# Patient Record
Sex: Male | Born: 1955 | Race: White | Hispanic: No | Smoking: Current every day smoker
Health system: Southern US, Community
[De-identification: ages and names within clinical notes are randomized; demographics above are authoritative.]

## PROBLEM LIST (undated history)

## (undated) ENCOUNTER — Emergency Department (HOSPITAL_COMMUNITY): Discharge status: Against Medical Advice | Payer: Self-pay

## (undated) ENCOUNTER — Emergency Department (HOSPITAL_COMMUNITY): Admission: EM | Disposition: A | Discharge status: Home / Self Care | Payer: Self-pay

## (undated) ENCOUNTER — Emergency Department (HOSPITAL_COMMUNITY): Admission: EM | Discharge status: Against Medical Advice | Payer: Medicare PPO

## (undated) DIAGNOSIS — I639 Cerebral infarction, unspecified: Secondary | ICD-10-CM

## (undated) DIAGNOSIS — G629 Polyneuropathy, unspecified: Secondary | ICD-10-CM

## (undated) DIAGNOSIS — G8929 Other chronic pain: Secondary | ICD-10-CM

## (undated) DIAGNOSIS — K219 Gastro-esophageal reflux disease without esophagitis: Secondary | ICD-10-CM

## (undated) DIAGNOSIS — F1010 Alcohol abuse, uncomplicated: Secondary | ICD-10-CM

## (undated) DIAGNOSIS — K7290 Hepatic failure, unspecified without coma: Secondary | ICD-10-CM

## (undated) DIAGNOSIS — K2270 Barrett's esophagus without dysplasia: Secondary | ICD-10-CM

## (undated) DIAGNOSIS — F1410 Cocaine abuse, uncomplicated: Secondary | ICD-10-CM

## (undated) DIAGNOSIS — K7460 Unspecified cirrhosis of liver: Secondary | ICD-10-CM

## (undated) DIAGNOSIS — K7682 Hepatic encephalopathy: Secondary | ICD-10-CM

## (undated) DIAGNOSIS — J449 Chronic obstructive pulmonary disease, unspecified: Secondary | ICD-10-CM

## (undated) DIAGNOSIS — H3320 Serous retinal detachment, unspecified eye: Secondary | ICD-10-CM

## (undated) DIAGNOSIS — I10 Essential (primary) hypertension: Secondary | ICD-10-CM

## (undated) DIAGNOSIS — D649 Anemia, unspecified: Secondary | ICD-10-CM

## (undated) DIAGNOSIS — M1990 Unspecified osteoarthritis, unspecified site: Secondary | ICD-10-CM

## (undated) DIAGNOSIS — M62838 Other muscle spasm: Secondary | ICD-10-CM

## (undated) DIAGNOSIS — F3130 Bipolar disorder, current episode depressed, mild or moderate severity, unspecified: Secondary | ICD-10-CM

## (undated) DIAGNOSIS — J40 Bronchitis, not specified as acute or chronic: Secondary | ICD-10-CM

## (undated) HISTORY — PX: EYE SURGERY: SHX253

## (undated) HISTORY — PX: FRACTURE SURGERY: SHX138

## (undated) HISTORY — PX: VASECTOMY: SHX75

## (undated) SURGERY — EGD (ESOPHAGOGASTRODUODENOSCOPY)
Anesthesia: Moderate Sedation | Laterality: Left

---

## 1997-12-04 ENCOUNTER — Emergency Department (HOSPITAL_COMMUNITY): Admission: EM | Admit: 1997-12-04 | Discharge: 1997-12-04 | Payer: Self-pay | Admitting: Emergency Medicine

## 1998-05-27 ENCOUNTER — Emergency Department (HOSPITAL_COMMUNITY): Admission: EM | Admit: 1998-05-27 | Discharge: 1998-05-27 | Payer: Self-pay | Admitting: Emergency Medicine

## 1998-09-03 ENCOUNTER — Emergency Department (HOSPITAL_COMMUNITY): Admission: EM | Admit: 1998-09-03 | Discharge: 1998-09-03 | Payer: Self-pay | Admitting: Emergency Medicine

## 1998-09-22 ENCOUNTER — Emergency Department (HOSPITAL_COMMUNITY): Admission: EM | Admit: 1998-09-22 | Discharge: 1998-09-22 | Payer: Self-pay | Admitting: Emergency Medicine

## 1998-09-24 ENCOUNTER — Emergency Department (HOSPITAL_COMMUNITY): Admission: EM | Admit: 1998-09-24 | Discharge: 1998-09-24 | Payer: Self-pay | Admitting: Emergency Medicine

## 1999-01-24 ENCOUNTER — Emergency Department (HOSPITAL_COMMUNITY): Admission: EM | Admit: 1999-01-24 | Discharge: 1999-01-24 | Payer: Self-pay | Admitting: Emergency Medicine

## 1999-10-29 ENCOUNTER — Emergency Department (HOSPITAL_COMMUNITY): Admission: EM | Admit: 1999-10-29 | Discharge: 1999-10-29 | Payer: Self-pay | Admitting: Emergency Medicine

## 2000-03-25 ENCOUNTER — Emergency Department (HOSPITAL_COMMUNITY): Admission: EM | Admit: 2000-03-25 | Discharge: 2000-03-25 | Payer: Self-pay | Admitting: Emergency Medicine

## 2001-07-09 ENCOUNTER — Emergency Department (HOSPITAL_COMMUNITY): Admission: EM | Admit: 2001-07-09 | Discharge: 2001-07-09 | Payer: Self-pay | Admitting: Emergency Medicine

## 2001-11-23 ENCOUNTER — Inpatient Hospital Stay (HOSPITAL_COMMUNITY): Admission: EM | Admit: 2001-11-23 | Discharge: 2001-11-26 | Payer: Self-pay | Admitting: Psychiatry

## 2001-12-31 ENCOUNTER — Inpatient Hospital Stay (HOSPITAL_COMMUNITY): Admission: EM | Admit: 2001-12-31 | Discharge: 2002-01-08 | Payer: Self-pay | Admitting: Psychiatry

## 2002-03-25 ENCOUNTER — Inpatient Hospital Stay (HOSPITAL_COMMUNITY): Admission: EM | Admit: 2002-03-25 | Discharge: 2002-03-28 | Payer: Self-pay | Admitting: Psychiatry

## 2002-06-26 ENCOUNTER — Emergency Department (HOSPITAL_COMMUNITY): Admission: EM | Admit: 2002-06-26 | Discharge: 2002-06-26 | Payer: Self-pay | Admitting: Emergency Medicine

## 2002-10-16 ENCOUNTER — Encounter: Admission: RE | Admit: 2002-10-16 | Discharge: 2002-10-16 | Payer: Self-pay | Admitting: Internal Medicine

## 2002-10-16 ENCOUNTER — Encounter: Payer: Self-pay | Admitting: Internal Medicine

## 2003-01-10 ENCOUNTER — Emergency Department (HOSPITAL_COMMUNITY): Admission: EM | Admit: 2003-01-10 | Discharge: 2003-01-10 | Payer: Self-pay | Admitting: Emergency Medicine

## 2003-01-16 ENCOUNTER — Emergency Department (HOSPITAL_COMMUNITY): Admission: EM | Admit: 2003-01-16 | Discharge: 2003-01-16 | Payer: Self-pay | Admitting: *Deleted

## 2003-08-25 ENCOUNTER — Emergency Department (HOSPITAL_COMMUNITY): Admission: EM | Admit: 2003-08-25 | Discharge: 2003-08-25 | Payer: Self-pay | Admitting: Emergency Medicine

## 2003-09-21 ENCOUNTER — Emergency Department (HOSPITAL_COMMUNITY): Admission: EM | Admit: 2003-09-21 | Discharge: 2003-09-21 | Payer: Self-pay | Admitting: Emergency Medicine

## 2004-04-09 ENCOUNTER — Emergency Department (HOSPITAL_COMMUNITY): Admission: EM | Admit: 2004-04-09 | Discharge: 2004-04-09 | Payer: Self-pay | Admitting: Emergency Medicine

## 2004-06-29 ENCOUNTER — Emergency Department (HOSPITAL_COMMUNITY): Admission: EM | Admit: 2004-06-29 | Discharge: 2004-06-29 | Payer: Self-pay | Admitting: Emergency Medicine

## 2004-11-02 ENCOUNTER — Emergency Department (HOSPITAL_COMMUNITY): Admission: EM | Admit: 2004-11-02 | Discharge: 2004-11-02 | Payer: Self-pay | Admitting: Emergency Medicine

## 2005-08-17 ENCOUNTER — Emergency Department (HOSPITAL_COMMUNITY): Admission: EM | Admit: 2005-08-17 | Discharge: 2005-08-18 | Payer: Self-pay | Admitting: Emergency Medicine

## 2006-07-17 ENCOUNTER — Ambulatory Visit (HOSPITAL_COMMUNITY): Admission: RE | Admit: 2006-07-17 | Discharge: 2006-07-17 | Payer: Self-pay | Admitting: Ophthalmology

## 2006-07-24 ENCOUNTER — Ambulatory Visit (HOSPITAL_COMMUNITY): Admission: RE | Admit: 2006-07-24 | Discharge: 2006-07-24 | Payer: Self-pay | Admitting: Ophthalmology

## 2006-08-17 ENCOUNTER — Emergency Department (HOSPITAL_COMMUNITY): Admission: EM | Admit: 2006-08-17 | Discharge: 2006-08-17 | Payer: Self-pay | Admitting: Emergency Medicine

## 2006-08-29 ENCOUNTER — Ambulatory Visit (HOSPITAL_COMMUNITY): Admission: RE | Admit: 2006-08-29 | Discharge: 2006-08-30 | Payer: Self-pay | Admitting: Orthopaedic Surgery

## 2007-02-26 ENCOUNTER — Ambulatory Visit (HOSPITAL_COMMUNITY): Admission: RE | Admit: 2007-02-26 | Discharge: 2007-02-26 | Payer: Self-pay | Admitting: Ophthalmology

## 2007-08-05 ENCOUNTER — Emergency Department (HOSPITAL_COMMUNITY): Admission: EM | Admit: 2007-08-05 | Discharge: 2007-08-05 | Payer: Self-pay | Admitting: Internal Medicine

## 2007-12-24 ENCOUNTER — Encounter: Payer: Self-pay | Admitting: Emergency Medicine

## 2007-12-25 ENCOUNTER — Ambulatory Visit: Payer: Self-pay | Admitting: Psychiatry

## 2007-12-25 ENCOUNTER — Inpatient Hospital Stay (HOSPITAL_COMMUNITY): Admission: EM | Admit: 2007-12-25 | Discharge: 2007-12-28 | Payer: Self-pay | Admitting: Psychiatry

## 2007-12-30 ENCOUNTER — Emergency Department (HOSPITAL_COMMUNITY): Admission: EM | Admit: 2007-12-30 | Discharge: 2007-12-31 | Payer: Self-pay | Admitting: Emergency Medicine

## 2008-02-20 ENCOUNTER — Emergency Department (HOSPITAL_COMMUNITY): Admission: EM | Admit: 2008-02-20 | Discharge: 2008-02-21 | Payer: Self-pay | Admitting: Emergency Medicine

## 2008-05-04 ENCOUNTER — Emergency Department (HOSPITAL_COMMUNITY): Admission: EM | Admit: 2008-05-04 | Discharge: 2008-05-05 | Payer: Self-pay | Admitting: Emergency Medicine

## 2008-08-18 ENCOUNTER — Ambulatory Visit: Payer: Self-pay | Admitting: Radiology

## 2008-08-18 ENCOUNTER — Encounter: Payer: Self-pay | Admitting: Emergency Medicine

## 2008-08-18 ENCOUNTER — Inpatient Hospital Stay (HOSPITAL_COMMUNITY): Admission: EM | Admit: 2008-08-18 | Discharge: 2008-08-21 | Payer: Self-pay | Admitting: Internal Medicine

## 2008-08-18 ENCOUNTER — Encounter (INDEPENDENT_AMBULATORY_CARE_PROVIDER_SITE_OTHER): Payer: Self-pay | Admitting: Internal Medicine

## 2008-08-19 ENCOUNTER — Ambulatory Visit: Payer: Self-pay | Admitting: Surgery

## 2008-08-19 ENCOUNTER — Encounter (INDEPENDENT_AMBULATORY_CARE_PROVIDER_SITE_OTHER): Payer: Self-pay | Admitting: Internal Medicine

## 2008-09-05 ENCOUNTER — Emergency Department (HOSPITAL_COMMUNITY): Admission: EM | Admit: 2008-09-05 | Discharge: 2008-09-05 | Payer: Self-pay | Admitting: Emergency Medicine

## 2008-10-18 ENCOUNTER — Encounter (INDEPENDENT_AMBULATORY_CARE_PROVIDER_SITE_OTHER): Payer: Self-pay | Admitting: Emergency Medicine

## 2008-10-18 ENCOUNTER — Emergency Department (HOSPITAL_COMMUNITY): Admission: EM | Admit: 2008-10-18 | Discharge: 2008-10-18 | Payer: Self-pay | Admitting: Emergency Medicine

## 2008-10-18 ENCOUNTER — Ambulatory Visit: Payer: Self-pay | Admitting: *Deleted

## 2008-10-29 ENCOUNTER — Emergency Department (HOSPITAL_COMMUNITY): Admission: EM | Admit: 2008-10-29 | Discharge: 2008-10-30 | Payer: Self-pay

## 2008-11-01 ENCOUNTER — Emergency Department (HOSPITAL_COMMUNITY): Admission: EM | Admit: 2008-11-01 | Discharge: 2008-11-01 | Payer: Self-pay | Admitting: Emergency Medicine

## 2008-11-02 ENCOUNTER — Encounter (INDEPENDENT_AMBULATORY_CARE_PROVIDER_SITE_OTHER): Payer: Self-pay | Admitting: Emergency Medicine

## 2008-11-02 ENCOUNTER — Ambulatory Visit (HOSPITAL_COMMUNITY): Admission: RE | Admit: 2008-11-02 | Discharge: 2008-11-02 | Payer: Self-pay | Admitting: Emergency Medicine

## 2008-11-02 ENCOUNTER — Ambulatory Visit: Payer: Self-pay | Admitting: *Deleted

## 2008-12-13 ENCOUNTER — Emergency Department (HOSPITAL_COMMUNITY): Admission: EM | Admit: 2008-12-13 | Discharge: 2008-12-13 | Payer: Self-pay | Admitting: Emergency Medicine

## 2009-01-20 ENCOUNTER — Ambulatory Visit: Payer: Self-pay | Admitting: Psychiatry

## 2009-01-20 ENCOUNTER — Emergency Department (HOSPITAL_COMMUNITY): Admission: EM | Admit: 2009-01-20 | Discharge: 2009-01-20 | Payer: Self-pay | Admitting: Emergency Medicine

## 2009-01-20 ENCOUNTER — Inpatient Hospital Stay (HOSPITAL_COMMUNITY): Admission: RE | Admit: 2009-01-20 | Discharge: 2009-01-22 | Payer: Self-pay | Admitting: Psychiatry

## 2009-02-12 ENCOUNTER — Emergency Department (HOSPITAL_COMMUNITY): Admission: EM | Admit: 2009-02-12 | Discharge: 2009-02-12 | Payer: Self-pay | Admitting: Emergency Medicine

## 2009-03-04 ENCOUNTER — Emergency Department (HOSPITAL_COMMUNITY): Admission: EM | Admit: 2009-03-04 | Discharge: 2009-03-04 | Payer: Self-pay | Admitting: Emergency Medicine

## 2009-03-04 ENCOUNTER — Inpatient Hospital Stay (HOSPITAL_COMMUNITY): Admission: RE | Admit: 2009-03-04 | Discharge: 2009-03-06 | Payer: Self-pay | Admitting: Psychiatry

## 2009-03-05 ENCOUNTER — Ambulatory Visit: Payer: Self-pay | Admitting: Psychiatry

## 2009-04-13 ENCOUNTER — Emergency Department (HOSPITAL_COMMUNITY): Admission: EM | Admit: 2009-04-13 | Discharge: 2009-04-14 | Payer: Self-pay | Admitting: Emergency Medicine

## 2009-04-14 ENCOUNTER — Ambulatory Visit: Payer: Self-pay | Admitting: Psychiatry

## 2009-04-14 ENCOUNTER — Inpatient Hospital Stay (HOSPITAL_COMMUNITY): Admission: AD | Admit: 2009-04-14 | Discharge: 2009-04-15 | Payer: Self-pay | Admitting: Psychiatry

## 2009-05-05 ENCOUNTER — Inpatient Hospital Stay (HOSPITAL_COMMUNITY): Admission: AD | Admit: 2009-05-05 | Discharge: 2009-05-09 | Payer: Self-pay | Admitting: Psychiatry

## 2009-05-05 ENCOUNTER — Emergency Department (HOSPITAL_COMMUNITY): Admission: EM | Admit: 2009-05-05 | Discharge: 2009-05-05 | Payer: Self-pay | Admitting: Emergency Medicine

## 2009-05-31 ENCOUNTER — Ambulatory Visit: Payer: Self-pay | Admitting: Psychiatry

## 2009-05-31 ENCOUNTER — Emergency Department (HOSPITAL_COMMUNITY): Admission: EM | Admit: 2009-05-31 | Discharge: 2009-06-01 | Payer: Self-pay | Admitting: Emergency Medicine

## 2009-06-01 ENCOUNTER — Inpatient Hospital Stay (HOSPITAL_COMMUNITY): Admission: RE | Admit: 2009-06-01 | Discharge: 2009-06-05 | Payer: Self-pay | Admitting: Psychiatry

## 2010-06-13 ENCOUNTER — Encounter: Payer: Self-pay | Admitting: Internal Medicine

## 2010-08-08 LAB — GLUCOSE, CAPILLARY
Glucose-Capillary: 207 mg/dL — ABNORMAL HIGH (ref 70–99)
Glucose-Capillary: 247 mg/dL — ABNORMAL HIGH (ref 70–99)
Glucose-Capillary: 257 mg/dL — ABNORMAL HIGH (ref 70–99)
Glucose-Capillary: 273 mg/dL — ABNORMAL HIGH (ref 70–99)
Glucose-Capillary: 287 mg/dL — ABNORMAL HIGH (ref 70–99)
Glucose-Capillary: 348 mg/dL — ABNORMAL HIGH (ref 70–99)
Glucose-Capillary: 356 mg/dL — ABNORMAL HIGH (ref 70–99)
Glucose-Capillary: 369 mg/dL — ABNORMAL HIGH (ref 70–99)

## 2010-08-08 LAB — URINALYSIS, ROUTINE W REFLEX MICROSCOPIC
Bilirubin Urine: NEGATIVE
Glucose, UA: >1000 mg/dL — AB
Hgb urine dipstick: NEGATIVE
Ketones, ur: NEGATIVE mg/dL
Leukocytes, UA: NEGATIVE
Nitrite: NEGATIVE
Protein, ur: NEGATIVE mg/dL
Specific Gravity, Urine: 1.039 — ABNORMAL HIGH (ref 1.005–1.030)
Urobilinogen, UA: 0.2 mg/dL (ref 0.0–1.0)
pH: 5 (ref 5.0–8.0)

## 2010-08-08 LAB — POCT I-STAT, CHEM 8
BUN: 10 mg/dL (ref 6–23)
Calcium, Ion: 1.16 mmol/L (ref 1.12–1.32)
Chloride: 101 mEq/L (ref 96–112)
Creatinine, Ser: 0.7 mg/dL (ref 0.4–1.5)
Glucose, Bld: 356 mg/dL — ABNORMAL HIGH (ref 70–99)
HCT: 48 % (ref 39.0–52.0)
Hemoglobin: 16.3 g/dL (ref 13.0–17.0)
Potassium: 4.7 mEq/L (ref 3.5–5.1)
Sodium: 129 mEq/L — ABNORMAL LOW (ref 135–145)
TCO2: 22 mmol/L (ref 0–100)

## 2010-08-08 LAB — RPR: RPR Ser Ql: NONREACTIVE

## 2010-08-08 LAB — URINE MICROSCOPIC-ADD ON: Urine-Other: NONE SEEN

## 2010-08-08 LAB — CBC
HCT: 45.2 % (ref 39.0–52.0)
Hemoglobin: 15.8 g/dL (ref 13.0–17.0)
MCHC: 35 g/dL (ref 30.0–36.0)
MCV: 93.5 fL (ref 78.0–100.0)
Platelets: 78 10*3/uL — ABNORMAL LOW (ref 150–400)
RBC: 4.84 MIL/uL (ref 4.22–5.81)
RDW: 13.5 % (ref 11.5–15.5)
WBC: 4.8 10*3/uL (ref 4.0–10.5)

## 2010-08-08 LAB — DIFFERENTIAL
Basophils Absolute: 0 10*3/uL (ref 0.0–0.1)
Basophils Relative: 0 % (ref 0–1)
Eosinophils Absolute: 0.1 10*3/uL (ref 0.0–0.7)
Eosinophils Relative: 2 % (ref 0–5)
Lymphocytes Relative: 23 % (ref 12–46)
Lymphs Abs: 1.1 10*3/uL (ref 0.7–4.0)
Monocytes Absolute: 0.4 10*3/uL (ref 0.1–1.0)
Monocytes Relative: 8 % (ref 3–12)
Neutro Abs: 3.2 10*3/uL (ref 1.7–7.7)
Neutrophils Relative %: 67 % (ref 43–77)

## 2010-08-08 LAB — RAPID URINE DRUG SCREEN, HOSP PERFORMED
Amphetamines: NOT DETECTED
Barbiturates: NOT DETECTED
Benzodiazepines: POSITIVE — AB
Cocaine: POSITIVE — AB
Opiates: POSITIVE — AB
Tetrahydrocannabinol: NOT DETECTED

## 2010-08-08 LAB — ETHANOL: Alcohol, Ethyl (B): <5 mg/dL (ref 0–10)

## 2010-08-08 LAB — PLATELET COUNT: Platelets: 69 10*3/uL — ABNORMAL LOW (ref 150–400)

## 2010-08-23 LAB — GLUCOSE, CAPILLARY
Glucose-Capillary: 253 mg/dL — ABNORMAL HIGH (ref 70–99)
Glucose-Capillary: 293 mg/dL — ABNORMAL HIGH (ref 70–99)
Glucose-Capillary: 301 mg/dL — ABNORMAL HIGH (ref 70–99)
Glucose-Capillary: 326 mg/dL — ABNORMAL HIGH (ref 70–99)
Glucose-Capillary: 372 mg/dL — ABNORMAL HIGH (ref 70–99)

## 2010-08-24 LAB — GLUCOSE, CAPILLARY
Comment 1: 8888
Glucose-Capillary: 219 mg/dL — ABNORMAL HIGH (ref 70–99)
Glucose-Capillary: 236 mg/dL — ABNORMAL HIGH (ref 70–99)
Glucose-Capillary: 285 mg/dL — ABNORMAL HIGH (ref 70–99)
Glucose-Capillary: 294 mg/dL — ABNORMAL HIGH (ref 70–99)
Glucose-Capillary: 300 mg/dL — ABNORMAL HIGH (ref 70–99)
Glucose-Capillary: 356 mg/dL — ABNORMAL HIGH (ref 70–99)
Glucose-Capillary: 379 mg/dL — ABNORMAL HIGH (ref 70–99)

## 2010-08-24 LAB — CBC
MCHC: 34.2 g/dL (ref 30.0–36.0)
MCV: 95.6 fL (ref 78.0–100.0)
Platelets: 93 10*3/uL — ABNORMAL LOW (ref 150–400)
RDW: 14.1 % (ref 11.5–15.5)

## 2010-08-24 LAB — COMPREHENSIVE METABOLIC PANEL
ALT: 54 U/L — ABNORMAL HIGH (ref 0–53)
AST: 49 U/L — ABNORMAL HIGH (ref 0–37)
Calcium: 9.2 mg/dL (ref 8.4–10.5)
Creatinine, Ser: 0.67 mg/dL (ref 0.4–1.5)
GFR calc Af Amer: >60 mL/min (ref >60)
Sodium: 128 mEq/L — ABNORMAL LOW (ref 135–145)
Total Protein: 7.4 g/dL (ref 6.0–8.3)

## 2010-08-24 LAB — URINALYSIS, ROUTINE W REFLEX MICROSCOPIC
Glucose, UA: >1000 mg/dL — AB
Leukocytes, UA: NEGATIVE
Nitrite: NEGATIVE
Protein, ur: NEGATIVE mg/dL
Urobilinogen, UA: 0.2 mg/dL (ref 0.0–1.0)

## 2010-08-24 LAB — RAPID URINE DRUG SCREEN, HOSP PERFORMED
Benzodiazepines: POSITIVE — AB
Tetrahydrocannabinol: NOT DETECTED

## 2010-08-24 LAB — DIFFERENTIAL
Eosinophils Absolute: 0.1 10*3/uL (ref 0.0–0.7)
Eosinophils Relative: 2 % (ref 0–5)
Lymphocytes Relative: 27 % (ref 12–46)
Lymphs Abs: 2 10*3/uL (ref 0.7–4.0)
Monocytes Relative: 6 % (ref 3–12)

## 2010-08-24 LAB — URINE MICROSCOPIC-ADD ON: Urine-Other: NONE SEEN

## 2010-08-25 LAB — GLUCOSE, CAPILLARY
Glucose-Capillary: 291 mg/dL — ABNORMAL HIGH (ref 70–99)
Glucose-Capillary: 418 mg/dL — ABNORMAL HIGH (ref 70–99)
Glucose-Capillary: 482 mg/dL — ABNORMAL HIGH (ref 70–99)

## 2010-08-25 LAB — RAPID URINE DRUG SCREEN, HOSP PERFORMED
Amphetamines: NOT DETECTED
Barbiturates: NOT DETECTED
Benzodiazepines: POSITIVE — AB
Cocaine: NOT DETECTED
Opiates: NOT DETECTED
Tetrahydrocannabinol: NOT DETECTED

## 2010-08-25 LAB — URINALYSIS, ROUTINE W REFLEX MICROSCOPIC
Bilirubin Urine: NEGATIVE
Glucose, UA: >1000 mg/dL — AB
Hgb urine dipstick: NEGATIVE
Ketones, ur: NEGATIVE mg/dL
Leukocytes, UA: NEGATIVE
Nitrite: NEGATIVE
Protein, ur: NEGATIVE mg/dL
Specific Gravity, Urine: 1.026 (ref 1.005–1.030)
Urobilinogen, UA: 0.2 mg/dL (ref 0.0–1.0)
pH: 5 (ref 5.0–8.0)

## 2010-08-25 LAB — COMPREHENSIVE METABOLIC PANEL WITH GFR
Albumin: 3.8 g/dL (ref 3.5–5.2)
BUN: 14 mg/dL (ref 6–23)
Chloride: 97 meq/L (ref 96–112)
Creatinine, Ser: 0.8 mg/dL (ref 0.4–1.5)
Total Bilirubin: 0.9 mg/dL (ref 0.3–1.2)
Total Protein: 7.5 g/dL (ref 6.0–8.3)

## 2010-08-25 LAB — DIFFERENTIAL
Basophils Absolute: 0 K/uL (ref 0.0–0.1)
Basophils Relative: 0 % (ref 0–1)
Eosinophils Absolute: 0.1 10*3/uL (ref 0.0–0.7)
Eosinophils Relative: 2 % (ref 0–5)
Lymphocytes Relative: 23 % (ref 12–46)
Lymphs Abs: 1.4 10*3/uL (ref 0.7–4.0)
Monocytes Absolute: 0.5 K/uL (ref 0.1–1.0)
Monocytes Relative: 8 % (ref 3–12)
Neutro Abs: 4 K/uL (ref 1.7–7.7)
Neutrophils Relative %: 67 % (ref 43–77)

## 2010-08-25 LAB — CBC
HCT: 45.1 % (ref 39.0–52.0)
Hemoglobin: 15.9 g/dL (ref 13.0–17.0)
MCHC: 35.3 g/dL (ref 30.0–36.0)
MCV: 93.4 fL (ref 78.0–100.0)
Platelets: 73 K/uL — ABNORMAL LOW (ref 150–400)
RBC: 4.83 MIL/uL (ref 4.22–5.81)
RDW: 13.9 % (ref 11.5–15.5)
WBC: 6.1 10*3/uL (ref 4.0–10.5)

## 2010-08-25 LAB — CK TOTAL AND CKMB (NOT AT ARMC)
CK, MB: 2.5 ng/mL (ref 0.3–4.0)
Relative Index: INVALID (ref 0.0–2.5)
Total CK: 76 U/L (ref 7–232)

## 2010-08-25 LAB — COMPREHENSIVE METABOLIC PANEL
ALT: 45 U/L (ref 0–53)
AST: 37 U/L (ref 0–37)
Alkaline Phosphatase: 86 U/L (ref 39–117)
CO2: 16 mEq/L — ABNORMAL LOW (ref 19–32)
Calcium: 9.9 mg/dL (ref 8.4–10.5)
GFR calc Af Amer: >60 mL/min (ref >60)
GFR calc non Af Amer: >60 mL/min (ref >60)
Glucose, Bld: 617 mg/dL (ref 70–99)
Potassium: 3.8 mEq/L (ref 3.5–5.1)
Sodium: 131 mEq/L — ABNORMAL LOW (ref 135–145)

## 2010-08-25 LAB — URINE MICROSCOPIC-ADD ON

## 2010-08-25 LAB — BASIC METABOLIC PANEL
CO2: 21 mEq/L (ref 19–32)
Calcium: 8.1 mg/dL — ABNORMAL LOW (ref 8.4–10.5)
Chloride: 103 mEq/L (ref 96–112)
Creatinine, Ser: 0.55 mg/dL (ref 0.4–1.5)
Glucose, Bld: 238 mg/dL — ABNORMAL HIGH (ref 70–99)
Sodium: 133 mEq/L — ABNORMAL LOW (ref 135–145)

## 2010-08-25 LAB — ETHANOL: Alcohol, Ethyl (B): 159 mg/dL — ABNORMAL HIGH (ref 0–10)

## 2010-08-25 LAB — TROPONIN I: Troponin I: 0.01 ng/mL (ref 0.00–0.06)

## 2010-08-25 LAB — KETONES, QUALITATIVE: Acetone, Bld: NEGATIVE

## 2010-08-26 LAB — BASIC METABOLIC PANEL
BUN: 13 mg/dL (ref 6–23)
Calcium: 8.9 mg/dL (ref 8.4–10.5)
Chloride: 98 mEq/L (ref 96–112)
Creatinine, Ser: 0.72 mg/dL (ref 0.4–1.5)
GFR calc Af Amer: >60 mL/min (ref >60)

## 2010-08-26 LAB — GLUCOSE, CAPILLARY
Glucose-Capillary: 300 mg/dL — ABNORMAL HIGH (ref 70–99)
Glucose-Capillary: 357 mg/dL — ABNORMAL HIGH (ref 70–99)
Glucose-Capillary: 376 mg/dL — ABNORMAL HIGH (ref 70–99)
Glucose-Capillary: 416 mg/dL — ABNORMAL HIGH (ref 70–99)
Glucose-Capillary: 471 mg/dL — ABNORMAL HIGH (ref 70–99)

## 2010-08-26 LAB — CBC
MCV: 94 fL (ref 78.0–100.0)
Platelets: 93 10*3/uL — ABNORMAL LOW (ref 150–400)
RBC: 4.82 MIL/uL (ref 4.22–5.81)
WBC: 7.5 10*3/uL (ref 4.0–10.5)

## 2010-08-26 LAB — RAPID URINE DRUG SCREEN, HOSP PERFORMED
Amphetamines: NOT DETECTED
Benzodiazepines: POSITIVE — AB
Cocaine: POSITIVE — AB
Tetrahydrocannabinol: NOT DETECTED

## 2010-08-26 LAB — DIFFERENTIAL
Basophils Relative: 1 % (ref 0–1)
Eosinophils Absolute: 0.2 10*3/uL (ref 0.0–0.7)
Lymphs Abs: 2.1 10*3/uL (ref 0.7–4.0)
Neutro Abs: 4.4 10*3/uL (ref 1.7–7.7)
Neutrophils Relative %: 59 % (ref 43–77)

## 2010-08-26 LAB — ETHANOL: Alcohol, Ethyl (B): 107 mg/dL — ABNORMAL HIGH (ref 0–10)

## 2010-08-27 LAB — GLUCOSE, CAPILLARY
Glucose-Capillary: 145 mg/dL — ABNORMAL HIGH (ref 70–99)
Glucose-Capillary: 222 mg/dL — ABNORMAL HIGH (ref 70–99)
Glucose-Capillary: 231 mg/dL — ABNORMAL HIGH (ref 70–99)
Glucose-Capillary: 263 mg/dL — ABNORMAL HIGH (ref 70–99)
Glucose-Capillary: 269 mg/dL — ABNORMAL HIGH (ref 70–99)

## 2010-08-27 LAB — HEPATIC FUNCTION PANEL
Albumin: 3.4 g/dL — ABNORMAL LOW (ref 3.5–5.2)
Indirect Bilirubin: 1 mg/dL — ABNORMAL HIGH (ref 0.3–0.9)
Total Protein: 6.6 g/dL (ref 6.0–8.3)

## 2010-08-28 LAB — CBC
Platelets: 74 10*3/uL — ABNORMAL LOW (ref 150–400)
RBC: 5.07 MIL/uL (ref 4.22–5.81)
WBC: 5.6 10*3/uL (ref 4.0–10.5)

## 2010-08-28 LAB — RAPID URINE DRUG SCREEN, HOSP PERFORMED
Amphetamines: NOT DETECTED
Barbiturates: NOT DETECTED
Benzodiazepines: POSITIVE — AB

## 2010-08-28 LAB — POCT I-STAT, CHEM 8
Calcium, Ion: 1.12 mmol/L (ref 1.12–1.32)
HCT: 52 % (ref 39.0–52.0)
Hemoglobin: 17.7 g/dL — ABNORMAL HIGH (ref 13.0–17.0)
TCO2: 20 mmol/L (ref 0–100)

## 2010-08-28 LAB — ETHANOL: Alcohol, Ethyl (B): 29 mg/dL — ABNORMAL HIGH (ref 0–10)

## 2010-08-28 LAB — DIFFERENTIAL
Basophils Absolute: 0 10*3/uL (ref 0.0–0.1)
Basophils Relative: 0 % (ref 0–1)
Eosinophils Absolute: 0.1 10*3/uL (ref 0.0–0.7)
Eosinophils Relative: 2 % (ref 0–5)
Lymphocytes Relative: 21 % (ref 12–46)
Monocytes Absolute: 0.4 10*3/uL (ref 0.1–1.0)
Neutrophils Relative %: 69 % (ref 43–77)

## 2010-08-28 LAB — GLUCOSE, CAPILLARY: Glucose-Capillary: 197 mg/dL — ABNORMAL HIGH (ref 70–99)

## 2010-08-28 LAB — URINALYSIS, ROUTINE W REFLEX MICROSCOPIC
Glucose, UA: >1000 mg/dL — AB
Ketones, ur: NEGATIVE mg/dL
Leukocytes, UA: NEGATIVE
Nitrite: NEGATIVE
Protein, ur: NEGATIVE mg/dL
pH: 6 (ref 5.0–8.0)

## 2010-08-28 LAB — URINE MICROSCOPIC-ADD ON

## 2010-08-30 LAB — URINALYSIS, ROUTINE W REFLEX MICROSCOPIC
Bilirubin Urine: NEGATIVE
Glucose, UA: >1000 mg/dL — AB
Ketones, ur: NEGATIVE mg/dL
pH: 5.5 (ref 5.0–8.0)

## 2010-08-30 LAB — CBC
Hemoglobin: 13.9 g/dL (ref 13.0–17.0)
Hemoglobin: 13.9 g/dL (ref 13.0–17.0)
MCHC: 34.5 g/dL (ref 30.0–36.0)
MCV: 94.4 fL (ref 78.0–100.0)
RBC: 4.24 MIL/uL (ref 4.22–5.81)
RBC: 4.27 MIL/uL (ref 4.22–5.81)
WBC: 3.7 10*3/uL — ABNORMAL LOW (ref 4.0–10.5)

## 2010-08-30 LAB — POCT I-STAT, CHEM 8
BUN: 9 mg/dL (ref 6–23)
Calcium, Ion: 1.15 mmol/L (ref 1.12–1.32)
Chloride: 102 mEq/L (ref 96–112)
Glucose, Bld: 172 mg/dL — ABNORMAL HIGH (ref 70–99)

## 2010-08-30 LAB — URINE MICROSCOPIC-ADD ON

## 2010-08-30 LAB — DIFFERENTIAL
Basophils Absolute: 0 10*3/uL (ref 0.0–0.1)
Eosinophils Absolute: 0.1 10*3/uL (ref 0.0–0.7)
Lymphocytes Relative: 34 % (ref 12–46)
Lymphs Abs: 1.1 10*3/uL (ref 0.7–4.0)
Monocytes Absolute: 0.3 10*3/uL (ref 0.1–1.0)
Monocytes Relative: 8 % (ref 3–12)
Neutro Abs: 1.9 10*3/uL (ref 1.7–7.7)
Neutrophils Relative %: 56 % (ref 43–77)

## 2010-08-30 LAB — POCT CARDIAC MARKERS: Troponin i, poc: <0.05 ng/mL (ref 0.00–0.09)

## 2010-08-30 LAB — COMPREHENSIVE METABOLIC PANEL
ALT: 34 U/L (ref 0–53)
CO2: 23 mEq/L (ref 19–32)
Calcium: 9.4 mg/dL (ref 8.4–10.5)
Chloride: 108 mEq/L (ref 96–112)
Creatinine, Ser: 0.76 mg/dL (ref 0.4–1.5)
GFR calc non Af Amer: >60 mL/min (ref >60)
Glucose, Bld: 298 mg/dL — ABNORMAL HIGH (ref 70–99)
Sodium: 138 mEq/L (ref 135–145)
Total Bilirubin: 0.6 mg/dL (ref 0.3–1.2)

## 2010-08-30 LAB — RAPID URINE DRUG SCREEN, HOSP PERFORMED
Amphetamines: NOT DETECTED
Barbiturates: NOT DETECTED
Opiates: NOT DETECTED

## 2010-08-31 LAB — COMPREHENSIVE METABOLIC PANEL
ALT: 31 U/L (ref 0–53)
AST: 39 U/L — ABNORMAL HIGH (ref 0–37)
Albumin: 3.5 g/dL (ref 3.5–5.2)
CO2: 24 mEq/L (ref 19–32)
Calcium: 9.1 mg/dL (ref 8.4–10.5)
GFR calc Af Amer: >60 mL/min (ref >60)
GFR calc non Af Amer: >60 mL/min (ref >60)
Sodium: 131 mEq/L — ABNORMAL LOW (ref 135–145)

## 2010-08-31 LAB — GLUCOSE, CAPILLARY
Glucose-Capillary: 212 mg/dL — ABNORMAL HIGH (ref 70–99)
Glucose-Capillary: 381 mg/dL — ABNORMAL HIGH (ref 70–99)

## 2010-08-31 LAB — URINALYSIS, ROUTINE W REFLEX MICROSCOPIC
Bilirubin Urine: NEGATIVE
Glucose, UA: >1000 mg/dL — AB
Hgb urine dipstick: NEGATIVE
Ketones, ur: NEGATIVE mg/dL
Leukocytes, UA: NEGATIVE
Nitrite: NEGATIVE
Protein, ur: NEGATIVE mg/dL
Specific Gravity, Urine: 1.031 — ABNORMAL HIGH (ref 1.005–1.030)
Urobilinogen, UA: 0.2 mg/dL (ref 0.0–1.0)
pH: 5.5 (ref 5.0–8.0)

## 2010-08-31 LAB — CBC
MCHC: 33.8 g/dL (ref 30.0–36.0)
RBC: 4.72 MIL/uL (ref 4.22–5.81)
WBC: 5.1 10*3/uL (ref 4.0–10.5)

## 2010-08-31 LAB — RAPID URINE DRUG SCREEN, HOSP PERFORMED
Amphetamines: NOT DETECTED
Barbiturates: NOT DETECTED
Benzodiazepines: POSITIVE — AB
Cocaine: POSITIVE — AB
Opiates: NOT DETECTED
Tetrahydrocannabinol: NOT DETECTED

## 2010-08-31 LAB — DIFFERENTIAL
Basophils Absolute: 0 10*3/uL (ref 0.0–0.1)
Basophils Relative: 0 % (ref 0–1)
Eosinophils Absolute: 0.2 10*3/uL (ref 0.0–0.7)
Eosinophils Relative: 4 % (ref 0–5)
Lymphocytes Relative: 22 % (ref 12–46)
Lymphs Abs: 1.1 10*3/uL (ref 0.7–4.0)
Monocytes Absolute: 0.4 10*3/uL (ref 0.1–1.0)
Monocytes Relative: 8 % (ref 3–12)
Neutro Abs: 3.3 10*3/uL (ref 1.7–7.7)
Neutrophils Relative %: 66 % (ref 43–77)

## 2010-08-31 LAB — BRAIN NATRIURETIC PEPTIDE: Pro B Natriuretic peptide (BNP): <30 pg/mL (ref 0.0–100.0)

## 2010-09-01 LAB — GLUCOSE, CAPILLARY
Glucose-Capillary: 114 mg/dL — ABNORMAL HIGH (ref 70–99)
Glucose-Capillary: 180 mg/dL — ABNORMAL HIGH (ref 70–99)

## 2010-09-01 LAB — LIPID PANEL
Cholesterol: 144 mg/dL (ref 0–200)
HDL: 39 mg/dL — ABNORMAL LOW (ref >39)
Total CHOL/HDL Ratio: 3.7 RATIO

## 2010-09-01 LAB — CBC
HCT: 39.3 % (ref 39.0–52.0)
MCHC: 35.1 g/dL (ref 30.0–36.0)
MCV: 94.9 fL (ref 78.0–100.0)
RBC: 4.14 MIL/uL — ABNORMAL LOW (ref 4.22–5.81)
WBC: 6.2 10*3/uL (ref 4.0–10.5)

## 2010-09-01 LAB — POCT I-STAT, CHEM 8
BUN: 5 mg/dL — ABNORMAL LOW (ref 6–23)
Calcium, Ion: 1.08 mmol/L — ABNORMAL LOW (ref 1.12–1.32)
Chloride: 108 mEq/L (ref 96–112)
Glucose, Bld: 123 mg/dL — ABNORMAL HIGH (ref 70–99)
Potassium: 3.9 mEq/L (ref 3.5–5.1)

## 2010-09-01 LAB — BASIC METABOLIC PANEL
BUN: 9 mg/dL (ref 6–23)
CO2: 26 mEq/L (ref 19–32)
Chloride: 96 mEq/L (ref 96–112)
Creatinine, Ser: 0.79 mg/dL (ref 0.4–1.5)
GFR calc Af Amer: >60 mL/min (ref >60)
Potassium: 4.1 mEq/L (ref 3.5–5.1)

## 2010-09-02 LAB — CARDIAC PANEL(CRET KIN+CKTOT+MB+TROPI)
CK, MB: 2.8 ng/mL (ref 0.3–4.0)
CK, MB: 4.3 ng/mL — ABNORMAL HIGH (ref 0.3–4.0)
Relative Index: 0.5 (ref 0.0–2.5)
Total CK: 682 U/L — ABNORMAL HIGH (ref 7–232)
Troponin I: <0.01 ng/mL (ref 0.00–0.06)
Troponin I: <0.01 ng/mL (ref 0.00–0.06)

## 2010-09-02 LAB — GLUCOSE, CAPILLARY
Glucose-Capillary: 159 mg/dL — ABNORMAL HIGH (ref 70–99)
Glucose-Capillary: 176 mg/dL — ABNORMAL HIGH (ref 70–99)
Glucose-Capillary: 189 mg/dL — ABNORMAL HIGH (ref 70–99)
Glucose-Capillary: 192 mg/dL — ABNORMAL HIGH (ref 70–99)
Glucose-Capillary: 195 mg/dL — ABNORMAL HIGH (ref 70–99)
Glucose-Capillary: 239 mg/dL — ABNORMAL HIGH (ref 70–99)
Glucose-Capillary: 262 mg/dL — ABNORMAL HIGH (ref 70–99)
Glucose-Capillary: 322 mg/dL — ABNORMAL HIGH (ref 70–99)
Glucose-Capillary: 188 mg/dL — ABNORMAL HIGH (ref 70–99)

## 2010-09-02 LAB — CULTURE, BLOOD (ROUTINE X 2)

## 2010-09-02 LAB — DIFFERENTIAL
Basophils Absolute: 0 10*3/uL (ref 0.0–0.1)
Basophils Relative: 0 % (ref 0–1)
Eosinophils Relative: 2 % (ref 0–5)
Lymphs Abs: 1 10*3/uL (ref 0.7–4.0)
Monocytes Absolute: 1 10*3/uL (ref 0.1–1.0)
Monocytes Relative: 7 % (ref 3–12)
Neutro Abs: 8.2 10*3/uL — ABNORMAL HIGH (ref 1.7–7.7)
Neutro Abs: 8.5 10*3/uL — ABNORMAL HIGH (ref 1.7–7.7)
Neutrophils Relative %: 81 % — ABNORMAL HIGH (ref 43–77)

## 2010-09-02 LAB — URINALYSIS, ROUTINE W REFLEX MICROSCOPIC
Bilirubin Urine: NEGATIVE
Ketones, ur: 15 mg/dL — AB
Nitrite: NEGATIVE
Specific Gravity, Urine: 1.027 (ref 1.005–1.030)
Urobilinogen, UA: 0.2 mg/dL (ref 0.0–1.0)

## 2010-09-02 LAB — BASIC METABOLIC PANEL
CO2: 27 mEq/L (ref 19–32)
CO2: 27 mEq/L (ref 19–32)
Chloride: 98 mEq/L (ref 96–112)
Chloride: 99 mEq/L (ref 96–112)
Creatinine, Ser: 0.97 mg/dL (ref 0.4–1.5)
GFR calc Af Amer: >60 mL/min (ref >60)
GFR calc Af Amer: >60 mL/min (ref >60)
Potassium: 4 mEq/L (ref 3.5–5.1)
Sodium: 130 mEq/L — ABNORMAL LOW (ref 135–145)

## 2010-09-02 LAB — SALICYLATE LEVEL: Salicylate Lvl: 1 mg/dL — ABNORMAL LOW (ref 2.8–20.0)

## 2010-09-02 LAB — CBC
HCT: 43.3 % (ref 39.0–52.0)
Hemoglobin: 14.9 g/dL (ref 13.0–17.0)
MCHC: 34.5 g/dL (ref 30.0–36.0)
MCV: 94.9 fL (ref 78.0–100.0)
Platelets: 70 10*3/uL — ABNORMAL LOW (ref 150–400)
RBC: 4.56 MIL/uL (ref 4.22–5.81)
RDW: 13.3 % (ref 11.5–15.5)
RDW: 12.3 % (ref 11.5–15.5)

## 2010-09-02 LAB — COMPREHENSIVE METABOLIC PANEL
BUN: 17 mg/dL (ref 6–23)
CO2: 28 mEq/L (ref 19–32)
Calcium: 8.5 mg/dL (ref 8.4–10.5)
Creatinine, Ser: 0.8 mg/dL (ref 0.4–1.5)
GFR calc non Af Amer: >60 mL/min (ref >60)
Glucose, Bld: 282 mg/dL — ABNORMAL HIGH (ref 70–99)
Total Protein: 7.2 g/dL (ref 6.0–8.3)

## 2010-09-02 LAB — POCT TOXICOLOGY PANEL: Benzodiazepines: POSITIVE

## 2010-09-02 LAB — POCT I-STAT 3, ART BLOOD GAS (G3+)
pCO2 arterial: 45 mmHg (ref 35.0–45.0)
pH, Arterial: 7.362 (ref 7.350–7.450)
pO2, Arterial: 61 mmHg — ABNORMAL LOW (ref 80.0–100.0)

## 2010-09-02 LAB — HEMOGLOBIN A1C
Hgb A1c MFr Bld: 8.7 % — ABNORMAL HIGH (ref 4.6–6.1)
Mean Plasma Glucose: 203 mg/dL

## 2010-09-02 LAB — TSH: TSH: 0.992 u[IU]/mL (ref 0.350–4.500)

## 2010-09-02 LAB — ACETAMINOPHEN LEVEL: Acetaminophen (Tylenol), Serum: <10 ug/mL — ABNORMAL LOW (ref 10–30)

## 2010-09-02 LAB — URINE MICROSCOPIC-ADD ON

## 2010-10-05 NOTE — Op Note (Signed)
NAMEELDEN, BRUCATO                 ACCOUNT NO.:  [id number]   MEDICAL RECORD NO.:  [id number]          PATIENT TYPE:  AMB   LOCATION:  SDS                          FACILITY:  [facility]   PHYSICIAN:  Alford Highland. Rankin, M.D.   DATE OF BIRTH:  11/07/1955   DATE OF PROCEDURE:  02/26/2007  DATE OF DISCHARGE:                               OPERATIVE REPORT   PREOPERATIVE DIAGNOSES:  1. 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  2. Proliferative vitreoretinopathy, right eye -- stage D,   POSTOPERATIVE DIAGNOSES:  1. 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  2. Proliferative vitreoretinopathy, right eye -- stage D .   PROCEDURE:  1. Posterior vitrectomy with complex repair of retinal detachment,      right eye -- 25 gauge -- using membrane peel techniques, air-fluid      exchange, endolaser photocoagulation and injection of vitreous      substitute -- C3F8 8% -- right eye.   SURGEON:  Alford Highland. Rankin, M.D.   ANESTHESIA:  Local with retrobulbar with monitored anesthesia control.   INDICATIONS FOR /PROCEDURE:  The patient is a 55 year old man with  profound vision loss in the right eye with a history of retinal  detachment successfully repaired in March 2008, OS.  The patient  understands this is an attempt to reattach the retina.  He understands  the risks of anesthesia including the rare occurrence of death, but also  to the eye from the condition as well the surgical repair including, but  not limited to, hemorrhage, infection, scarring, need for surgery, no  change in vision, loss of vision and progressive disease despite  intervention.  After appropriate signed consent was obtained, the  patient was taken to the operating room.  In the operating room,  appropriate monitors were followed by mild sedation.  Marcaine 75%, was  delivered, 5 mL, retrobulbar followed by an additional 5 mL in a fashion  of  modified Darel Hong.  The right periocular region was sterilely prepped  and draped in the usual ophthalmic fashion.  A lid speculum was applied.  A 12-gauge trocar was placed in the inferotemporal quadrant.  Superior  trocars were applied.  Core vitrectomy was then begun.  Vitreous skirt  was trimmed 360 degrees and scleral depression was then used.  Fluid-air  exchange was carried out through the inferior retinal break at the 6  o'clock position.  A retinal break was noted at the 11 o'clock position.  The posterior hyaloid was removed with the vitreous skirt trimmed 360 mL  using scleral depression.  At this time, a small retinotomy inferior to  the optic nerve was created so as to drain the posterior subretinal  fluid.  Fluid-air exchange was completed.  Reaspiration of the re-  collected fluid was done with a soft-tipped silicone needle.   At this time, endolaser photocoagulation was placed 360 degrees as well  as around the retinal breaks previously described, as well as subsequent  around the retinotomy site.   At this time an air-C3F8 8% exchange was completed.  The elevating  trocars were removed.   The infusion was removed.  Subconjunctival Decadron was applied.  Sterile patch and Fox shield were applied.  The patient was taken to the  short-stay area in good and stable condition, having tolerated the  procedure without complication.      Alford Highland Rankin, M.D.  Electronically Signed     GAR/MEDQ  D:  02/26/2007  T:  02/27/2007  Job:  [id number]
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
## 2010-10-05 NOTE — Discharge Summary (Signed)
[name]                 ACCOUNT NO.:  [id number]   MEDICAL RECORD NO.:  [id number]          PATIENT TYPE:  INP   LOCATION:  5021                         FACILITY:  [facility]   PHYSICIAN:  Peggye Pitt, M.D. DATE OF BIRTH:  [birth year]   DATE OF ADMISSION:  08/18/2008  DATE OF DISCHARGE:  08/21/2008                               DISCHARGE SUMMARY   DISCHARGE DIAGNOSES:  1. Transient ischemic attack.  2. Right lower lobe pneumonia.  3. Type 2 diabetes mellitus.  4. Hypertension.  5. History of alcohol abuse.   DISCHARGE MEDICATIONS:  1. Avelox 400 mg daily for 10 days.  2. Aspirin 81 mg daily.  3. Lantus insulin 35 units subcu at bedtime.  4. Multivitamin 1 tablet daily.  5. Thiamine 100 mg daily.  6. Neurontin 300 mg 3 times a day.  7. Actos 30 mg daily.  8. Lisinopril 20 mg daily.  9. Glipizide 10 mg daily.  10.Amitriptyline 50 mg at bedtime.  11.Vistaril 75 mg at bedtime p.r.n.  12.Trazodone 50 mg at bedtime p.r.n. for insomnia.  13.Robaxin 1500 mL p.o. every 6 hours as needed for pain prescribed by      [name].  14.Ibuprofen 800 mg p.o. every 8 hours as needed for pain.   DISPOSITION AND FOLLOWUP:  [name] is discharged home in stable  condition.  He is advised to keep a CBG log and to bring this into his  primary care physician at his next visit.  I have encouraged that he  call his physician, [name], and setup an appointment for hospital  followup in about 2 weeks.  In about 4-6 weeks, a repeat chest x-ray  should be done to ensure resolution of his pneumonia.   CONSULTATION THIS HOSPITALIZATION:  None.   IMAGES AND PROCEDURES PERFORMED DURING THIS HOSPITALIZATION:  1. A CT scan of the head without contrast on August 18, 2008, that      showed no acute significant findings.  2. A chest x-ray on August 18, 2008, that showed suboptimal inspiration      with bronchitic changes, cannot exclude early right lower lobe      pneumonia.  3. Repeat chest  x-ray on August 19, 2008, that shows progression of      right lower lobe infiltrate with progression of vascular congestion      and mild edema.  4. Repeat CT scan without contrast on August 21, 2008, that was negative      for bleed or other acute intracranial process.  The patient also      had carotid Dopplers that showed 60% bilateral ICA stenosis.  5. A 2-D echocardiogram on August 19, 2008, that shows normal LV size      and systolic function.  Mild LVH.  Diastolic relaxation      abnormality.   HISTORY AND PHYSICAL EXAMINATION:  For full details, please refer to  dictation on August 18, 2008, by Dr. Toniann Fail.  But, in brief, Mr.  Baker is a pleasant 55 year old obese Caucasian man with history of  hypertension, diabetes, and chronic alcoholism who  has been at a detox  center for the last 7 days.  He complained of right-sided weakness at  the detox center for which I sent him to the ER for further evaluation.  Upon arrival, his focal neurological deficits had completely resolved.  CT scan of the head did not show any acute findings.  A chest x-ray  showed a possible pneumonia and because of this, he was admitted to the  hospital for further evaluation and management.   HOSPITAL COURSE:  1. Transient ischemic attack.  Initial CT scan showed no evidence for      an acute bleed.  Because of weight restrictions, we could not do an      MRI.  However, a repeat CT at over 48 hours did not show any      evidence of a CVA.  We have started him on aspirin.  Physical      Therapy has cleared for discharge home.  Main thing at this point      will be aggressive risk factor modification to control his blood      pressure and his diabetes.  He also had a fasting lipid panel for      risk stratification in the hospital that showed a total cholesterol      144, triglycerides of 74, HDL of 39, and an LDL of 90.  No statin      has been prescribed upon discharge.  2. For his right lower lobe  pneumonia, he was initially on Zosyn and      azithromycin for concern of aspiration pneumonia.  He has been      afebrile and has not had leukocytosis.  On day of discharge, I have      transitioned him over to p.o. Avelox for which he will have to take      a 10-day course.  I would recommend that he follow up with Dr.      [name] and a repeat chest x-ray will be done in 4-6 weeks to ensure      resolution.  3. Hypertension.  Excellent blood pressure control.  4. For his type 2 diabetes, his CBG is are much improved on increased      Lantus from 20-25.  This may need to be further adjusted in the      outpatient setting.  5. For his alcohol abuse, he has been kept on thiamine while in the      hospital.   VITAL SIGNS ON DAY OF DISCHARGE:  Blood pressure 120/75, heart rate 94,  respirations 20, O2 sats 95% on room air with a temperature of 99.0.   LABORATORY DATA ON DAY OF DISCHARGE:  Sodium 134, potassium 4.1,  chloride 96, bicarb 26, BUN 9, creatinine 0.79 with a glucose of 163,  WBC 6.2, hemoglobin 13.8, and a platelet count of 80,000.      Peggye Pitt, M.D.  Electronically Signed     EH/MEDQ  D:  08/21/2008  T:  08/22/2008  Job:  [id number]   cc:   Lonia Blood, M.D.

## 2010-10-05 NOTE — H&P (Signed)
NAMEALLIE, GERHOLD NO.:  [id number]   MEDICAL RECORD NO.:  [id number]          PATIENT TYPE:  INP   LOCATION:  2607                         FACILITY:  [facility]   PHYSICIAN:  Eduard Clos, MDDATE OF BIRTH:  1955-10-15   DATE OF ADMISSION:  08/18/2008  DATE OF DISCHARGE:                              HISTORY & PHYSICAL   PRIMARY CARE PHYSICIAN:  Lonia Blood, M.D.   CHIEF COMPLAINT:  Right-sided weakness.   HISTORY OF PRESENTING ILLNESS:  A 55 year old male with known history of  hypertension, diabetes mellitus type 2, chronic alcoholism on detox now  last 7 days, had complaints of right-sided weakness when he woke up in  the morning and was taken to the ER.  In the ER, Dr. Carleene Cooper, who  saw patient, found that he had no focal deficits.  CT of the head was  done which also did not show any acute findings.  Patient was very  drowsy.  A chest x-ray was done which showed possibility of pneumonia.  Patient has been admitted for further observation and evaluation of his  pneumonia and possible TIA.  Patient at this time is asymptomatic,  drowsy, easily arousable and answers questions appropriately.  Patient  denies any chest pain, weakness of upper or lower extremities now,  denies any shortness of breath, nausea, vomiting, abdominal pain,  dysuria, discharges or diarrhea.  Patient did say that he had some cough  with a productive sputum for last 2 or 3 days and he has been at detox  for last 7 days and been off alcohol.  Patient says that his weakness  was transient in the morning.  When he woke up, he was unable to move  his right upper and right lower extremity and, in addition, also had  some pain in the right shoulder.  He states the pain was not the  limiting factor but he did have some weakness which he thought he may  have had a stroke.  There was no weakness in his left upper or left  lower extremity and there is no headache or facial asymmetry.   Had some  slurred speech which has gotten better.  Patient denies any difficult  swallowing or visual symptoms.   PAST MEDICAL HISTORY:  1. Hypertension.  2. Diabetes mellitus type 2.  3. Chronic alcoholism.  4. Severe smoking.   PAST SURGICAL HISTORY:  None.   MEDICATION PRIOR TO ADMISSION:  1. Multivitamin 1 tablet p.o. daily.  2. Thiamine 100 mg p.o. daily.  3. Neurontin 300 mg p.o. t.i.d.  4. Actos 30 mg p.o. daily.  5. Lisinopril 20 mg p.o. daily.  6. Glipizide 10 mg p.o. daily.  7. Amitriptyline 50 mg p.o. at bedtime.  8. Lantus 50 units subcutaneous at bedtime.  9. Vistaril 75 mg p.o. q.6 p.r.n. for anxiety.  10.Trazodone 50 mg p.o. at bedtime p.r.n. for insomnia.  11.Robaxin 2500 mg p.o. q.6 p.r.n. for pain.  12.Ibuprofen 800 mg p.o. q.8 hourly p.r.n. for pain.   FAMILY HISTORY:  Nothing contributory.   SOCIAL HISTORY:  Patient smokes  cigarettes, has been strongly advised to  quit smoking.  Has not had alcohol for the last 7 days because he was in  rehab, is trying to detox and denies drug abuse though was on chronic  pain pills for chronic myalgias.   ALLERGIES:  No known drug allergies.   REVIEW OF SYSTEMS:  As per the history of presenting illness; nothing  else significant.   PHYSICAL EXAMINATION:  Patient examined at bedside, not in acute  distress.  VITAL SIGNS:  Blood pressure 120/60, pulse 90 per minute, temperature  98, respirations 18 per minute, O2 saturation 98% on 2 L.  HEENT:  Anicteric, no pallor.  No facial asymmetry.  Tongue is midline.  PERRLA positive.  No neck rigidity.  CHEST:  Bilateral air entry present.  No rhonchi.  No crepitation.  HEART:  S1 and S2 heard.  ABDOMEN:  Soft, nontender.  Bowel sounds heard.  CNS:  Patient mildly drowsy, easily arousable, follows commands,  oriented to time, place and person.  Moves upper and lower extremities  5/5.  EXTREMITIES:  Peripheral pulses felt.  Has chronic skin changes in the  lower  extremities.  No edema.   LABS:  CT of the head shows no acute significant findings.  Chest x-ray  shows a suboptimal level of inspiration.  There are bronchitic changes,  cannot exclude  early right lower lobe pneumonia.  ABG:  A pH is 7.36,  pCO2 is 45, pO2 is 61, oxygen saturation 90% on room air.  CBC:  WBC  10.4, hemoglobin 14.9, hematocrit 43.3, platelets 82, neutrophils 81%.  Complete metabolic panel:  Sodium 130, potassium 5.1, chloride 95, CO2  of 28, glucose 282, anion gap 7, BUN 17, creatinine 0.8.  AST 68, ALT  38, albumin 4, calcium 8.5.  Acetaminophen level less than 10,  salicylate level 1.  Drug screen is only positive for benzo.  Patient  used to take Xanax which he also took before he went to detox.  Alcohol  level less than 10.  Urine is positive for glucose more 1000.  Ketones  15 with anion gap is only 7.  WBC 0.   ASSESSMENT:  1. Possible transient ischemic attack.  Symptoms are completely      resolved now.  No focal deficit.  2. Possible aspiration pneumonia.  3. Hypertension.  4. Diabetes mellitus type 2.  5. Bipolar disorder.  6. Hyponatremia.   PLAN:  We will admit patient to telemetry, place on neuro checks, get  MRI/MRA, 2-D echo, carotid Dopplers and we will place patient on empiric  antibiotics and patient's symptoms improve, we will try to taper off  antibiotics and change to p.o. antibiotics.  We will get a repeat chest  x-ray in the a.m. and we will continue his home medication including  Lantus insulin and sliding scale insulin and further recommendation as  condition evolves.      Eduard Clos, MD  Electronically Signed     ANK/MEDQ  D:  08/18/2008  T:  08/18/2008  Job:  [id number]
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
## 2010-10-08 NOTE — H&P (Signed)
[facility]  Patient:    [name], [name] Visit Number: [id number] MRN: [id number]          Service Type: PSY Location: 500 [id number] 02 Attending Physician:  Rachael Fee Dictated by:   Young Berry Scott, N.P. Admit Date:  11/23/2001 Discharge Date: 11/26/2001                     Psychiatric Admission Assessment  DATE OF ASSESSMENT:  November 23, 2001 at 11:20 a.m.  IDENTIFYING INFORMATION:  This is a 55 year old Caucasian male, who is a voluntary admission.  He is divorced.  HISTORY OF PRESENT ILLNESS:  This patient, with a history of "bipolar illness", presented in the emergency room yesterday, requesting to be detoxed from alcohol.  He stated that he was sick and tired of drinking and wanted to be off the alcohol.  He reports drinking for many years socially with increased use, usually heavy daily use for the past five years, since he divorced his wife.  Recently, he has been drinking 7-8 beers daily for several months with occasional use of cocaine.  He denies suicidal ideation or homicidal ideation or auditory or visual hallucinations.  He denies any seizures in the past or delirium tremors associated with alcohol withdrawal. ETOH level was 48 in the emergency room.  PAST PSYCHIATRIC HISTORY:  The patient has a history of bipolar disease that he states was diagnosed approximately five years ago after he was hospitalized in [place].  At that point, he was prescribed Depakote but he states he never took it.  He also does have a past history of taking Celexa and Lexapro in the past for depression but has taken things irregularly, only when he had the insurance to pay for the medication.  He has a history of prior admissions at [facility] in [place] for detox from alcohol and other substances and was treated at [facility] in 2000.  This is his first admission to [facility].  SOCIAL HISTORY:  The patient reports he is originally  from [place].  Has lived here in [place] for the past eight years, on and off.  He is currently divorced from his wife for the past five years and he has three grown children.  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  He is currently unemployed and has Medicaid to pay for prescription medications.  He has applied for social security disability income on the basis of his diabetes and his mental illness.  FAMILY HISTORY:  Father and four brothers with history of alcohol abuse.  ALCOHOL/DRUG HISTORY:  The patients urine drug screen was positive for cocaine.  In addition to his alcohol use, he reports occasional use of cocaine.  MEDICAL HISTORY:  The patient has no regular primary care Merdith Adan.  The patient reports that he has had elevated blood sugar for the past two years and has been taking his fathers Glucophage, taking approximately 2000 mg twice daily but taking this irregularly and has not had any medical follow-up for this.  He was diagnosed with tinea versicolor and does have some rash over his upper torso and his left arm but has not taken any antifungal creams for this in several weeks.  MEDICATIONS:  Currently none.  DRUG ALLERGIES:  None.  POSITIVE PHYSICAL FINDINGS:  The patients physical examination was done at [facility] Emergency Room where it was essentially unremarkable.  LABORATORY DATA:  Glucose 277.  Sodium mildly decreased at 134 but otherwise electrolytes within normal limits.  The patients BUN 10, creatinine 0.8.  CBC reveals WBC 7.6, RBC 5.37, hemoglobin 16.9, hematocrit 47.8.  The patients platelets are decreased at 113,000, although he denies any prior history of blood problems.  MCV is 88.9 and RDW 12.9.  The patients urine drug screen is positive for cocaine.  Urine shows greater than 1000 mg of glucose.  Urine is yellow and clear.  Protein shows 30 mg and ketones are trace.  WBCs are  0-2. Thyroid panel is currently pending.  MENTAL STATUS EXAMINATION:  This is a medium-built, generally healthy-appearing, disheveled Caucasian male who is fully alert, in no acute distress with an appropriate affect and generally normal motor behavior. Sensory also appears grossly intact.  His affect is appropriate.  He is pleasant, cooperative and polite.  Speech is normal, relevant and relatively articulate.  Mood is euthymic.  Thought process is logical and coherent, generally goal directed.  The patient is candid about his substance abuse and his alcohol use and its effect on his life over the last few years.  His insight is good.  There is no evidence of dangerous ideation.  No paranoia. He is able to track conversation well and think logically.  Cognitively, he is intact and oriented x 3.  Intelligence is average to above average.  Insight good.  Impulse control and judgment within normal limits.  DIAGNOSES: Axis I:    1. Alcohol abuse; rule out dependence.            2. Cocaine abuse; rule out dependence.            3. Bipolar disorder, by history. Axis II:   Deferred. Axis III:  1. Diabetes mellitus, type 2.            2. Tinea versicolor by history.            3. Thrombocytopenia. Axis IV:   Moderate (use of alcohol to cope with his issues and some economic            problems related to erratic employment and problems with having            family members who also use alcohol and other substances.  Having a            supportive brother is definitely an asset). Axis V:    Current 38; past year 59.  PLAN:  Voluntarily admit the patient to detox him from alcohol with a goal of a safe detox in five days and we will evaluate his previous mood disorder when he makes some progress.  Consider starting him on Lexapro after he makes some progress with detox, if depression seems to be an issue.  Meanwhile, we have elected to start him on phenobarbital detox program with no loading  dose and that will start today.  For his elevated glucose, we will start him on Glucophage 500 mg p.o. b.i.d. and titrate that dosage as needed and we will  keep him on capillary blood glucoses b.i.d. a.c. supper and breakfast.  For his tinea versicolor, which he is having an exacerbation of, we will put him on Nizoral 2% cream to be applied daily after showering for the next two weeks and we will ask him to follow up and get a primary care physician for his additional medical problems.  ESTIMATED LENGTH OF STAY:  Five days. Dictated by:   Young Berry Scott, N.P.  Attending Physician:  Rachael Fee DD:  11/23/01 TD:  11/23/01 Job: [id number]

## 2010-10-08 NOTE — Discharge Summary (Signed)
NAME:  [name], [name] NO.:  [id number]   MEDICAL RECORD NO.:  [id number]          PATIENT TYPE:  IPS   LOCATION:  0402                          [facility]ACILITY:  [facility]   PHYSICIAN:  Anselm Jungling, MD  DATE OF BIRTH:  1955-06-23   DATE OF ADMISSION:  12/25/2007  DATE OF DISCHARGE:  12/28/2007                               DISCHARGE SUMMARY   IDENTIFYING DATA/REASON FOR ADMISSION:  The patient is a 54 year old  separated white male who indicated that he had started drinking alcohol  heavily to self-medicate, giving the rationalization that he had run out  of his medications.  He went to the emergency department asking for  detoxification and stated that he would commit suicide if he did not get  these services.  Please refer to the admission note for further details  pertaining to the symptoms, circumstances and history that led to his  hospitalization.  He was given an initial Axis I diagnosis of alcohol  dependence.   MEDICAL/LABORATORY:  The patient came to [place] with a history of  hypertension and diabetes mellitus.  He was treated with an insulin  regimen, lisinopril 10 mg daily.  He was also given Robaxin 500 mg twice  daily for muscle relaxation.  He requested a prescription for Chantix at  the time of discharge.   HOSPITAL COURSE:  The patient was admitted to the adult inpatient  psychiatric service.  He presented as a well-nourished, normally-  developed male who was initially evaluated by Dr. Milford Cage.  On the  third hospital day, the undersigned assumed the patient's care.  He had  been on a Librium detoxification protocol.  He had a family meeting and  reported that it had gone well.  His discharge plan was to return home  with his wife, who is involved in Al-Anon.  He planned to return to  Alcoholics Anonymous and get a sponsor.  He was pleasant, relaxed and  nontremulous.  He was also planning to see Dr. Tillie Fantasia, psychiatrist  again for medication  management.  He requested discharge on the fourth  hospital day.  He was not finished with his Librium protocol, but he did  not feel he needed it.  He agreed to the following aftercare plan.   AFTERCARE:  1. The patient was to follow up at the [facility] with an      appointment on January 08, 2008.  2. He was instructed to follow up with Dr. Britt Boozer for medical issues.   DISCHARGE MEDICATIONS:  1. Insulin regimen as usual.  2. Lisinopril 10 mg daily.  3. Robaxin 500 mg b.i.d.  4. Trazodone 100 mg q.h.s. as needed for sleep.  5. Chantix per instructions on box.   DISCHARGE DIAGNOSES:  AXIS I:  Alcohol dependence.  AXIS II:  Deferred.  AXIS III:  History of hypertension, diabetes mellitus.  AXIS IV:  Stressors, severe.  AXIS V:  Global assessment of functioning on discharge 60.      Anselm Jungling, MD  Electronically Signed     SPB/MEDQ  D:  01/09/2008  T:  01/09/2008  Job:  [id number]
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
## 2010-10-08 NOTE — Discharge Summary (Signed)
NAME:  [name], CORRADI NO.:  [id number]   MEDICAL RECORD NO.:  [id number]                   PATIENT TYPE:  IPS   LOCATION:  0506                                 [facility]ILITY[facility]   PHYSICIAN:  Jeanice Lim, M.D.              DATE OF BIRTH:  1955-06-17   DATE OF ADMISSION:  03/25/2002  DATE OF DISCHARGE:  03/28/2002                                 DISCHARGE SUMMARY   IDENTIFYING DATA:  This is a 55 year old white male, divorced, voluntarily  admitted, reported my mood has been crazy and I had to get back to doing the  right things and taking the right medications.  He relapsed on alcohol,  drinking a total of a 12-pack of beer daily and relapsed on cocaine.  He had  recently been discharged from [facility] and was doing well  until he stopped taking his Depakote several weeks prior to admission and  stopped taking lithium and began using Klonopin for his nerves.   PAST PSYCHIATRIC HISTORY:  Followed by Rozanna Box, last seen two weeks  ago.  Third admission at [facility].  Long history of  substance abuse including cocaine and alcohol.  History of attending  Alcoholics Anonymous.   MEDICATIONS:  1. Lithium, previously stabilized on 600 mg b.i.d.  2. Depakote 350 mg bid and 500 q.h.s.  3. Gabitril 4 mg t.i.d.  4. Trazodone 100 mg q.h.s.  5. Glucophage XR 1000 mg b.i.d.  6. Hydrochlorothiazide 12.5 mg q.a.m.  7. Lotensin 10 mg q.a.m.  8. Amaryl 4 mg q.a.m. and 2 mg at 5 p.m.  9. Novolin insulin in the past on a sliding scale basis.  10.      K-Dur 20 mEq daily.   ALLERGIES:  No known drug allergies.   PHYSICAL EXAMINATION:  Essentially within normal limits.  Neurologically  nonfocal.   LABORATORY DATA:  Routine admission labs with a mildly elevated white count,  otherwise, within normal limits except for glucose elevated at 184.   MENTAL STATUS EXAM:  Overweight, middle-aged male, sleepy, responsive  grossly.   Psychomotor within normal limits.  Cooperative.  Blunted affect.  Speech within normal limits.  Mood mildly depressed.  Feeling somewhat  guilty about relapse and alcohol.  Thought process goal directed.  Thought  content vague suicidal ideation.  Depressed mood.  Cognitively intact.  Judgment and insight limited and poor impulse control.   ADMISSION DIAGNOSES:   AXIS I:  1. Bipolar disorder, NOS.  2. Polysubstance abuse.  3. 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   AXIS II:  None.   AXIS III:  1. Diabetes mellitus type 2.  2. Hypertension.  3. Thrombocytopenia.   AXIS IV:  Moderate stressors related to domestic stress, stress from  girlfriend's mother and chronic medication noncompliance.   AXIS V:  30/65.  Patient was admitted on routine p.r.n. medications, underwent further  monitoring, was  encouraged to participate in individual, group and _____  therapy.  Was resumed on a sliding scale insulin and Librium p.r.n.  withdrawal, Lotensin, hydrochlorothiazide, Glucophage, Amaryl, Depakote,  Gabitril, K-Dur, trazodone, Vistaril p.r.n. anxiety.  General medicine  consult was obtained regarding management of diabetes and hypertension.  Patient was placed in the dual diagnosis tract for therapy and patient was  placed on Librium detox protocol and Symmetrel for cocaine cravings.  Amaryl  was adjusted for control of glucose the patient tolerated detox without  complications and clinical intervention with a positive response.   CONDITION ON DISCHARGE:  Markedly improved.  Mood more euthymic.  Affect  varied.  Thought process goal directed.  Thought content negative for  dangerous ideation or psychotic symptoms.  Patient had no acute medical  complaints, no acute withdrawal symptoms.  Reported motivation to be  compliant with the aftercare plan including to remain sober.  Patient was  discharged on K-Dur 20 mEq q.a.m., Ambien 10 mg q.h.s., Lotensin 10 mg  q.a.m., hydrochlorothiazide 12.5 mg  q.a.m., Glucophage XR 500 mg two b.i.d.,  Amaryl 2 mg one at 5 p.m., Depakote 250 mg b.i.d. and 2 q.h.s., Gabitril 4  mg t.i.d.  Patient was to avoid alcohol.  Was aware that it was life  threatening to drink on the medications that he is currently on.  He is  scheduled to follow up with the primary care physician within one week of  discharge and to [facility] on 11/11 at 2:15 p.m.   DISCHARGE DIAGNOSES:   AXIS I:  1. Bipolar disorder, NOS.  2. Polysubstance abuse.  3. 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   AXIS II:  None.   AXIS III:  1. Diabetes mellitus type 2.  2. Hypertension.  3. Thrombocytopenia.   AXIS IV:  Moderate stressors related to domestic stress, stress from  girlfriend's mother, and chronic medication noncompliance.    AXIS V:  GAF on discharge was 55.                                                Jeanice Lim, M.D.    JEM/MEDQ  D:  04/16/2002  T:  04/16/2002  Job:  [id number]

## 2010-10-08 NOTE — Discharge Summary (Signed)
NAME:  [name], [name]                           ACCOUNT NO.:  [id number]   MEDICAL RECORD NO.:  [id number]                   PATIENT TYPE:  IPS   LOCATION:  0500                                 FACILITY:  [facility]   PHYSICIAN:  Carolanne Grumbling, MD                   DATE OF BIRTH:  [birth year]   DATE OF ADMISSION:  12/31/2001  DATE OF DISCHARGE:  01/08/2002                                 DISCHARGE SUMMARY   IDENTIFYING INFORMATION:  The patient is a 55 year old male.   INITIAL ASSESSMENT AND DIAGNOSIS:  The patient was admitted to the hospital  for detox.  He had started drinking about a week prior to admission, saying  that he had run out of his Klonopin.  Consequently, he started drinking and  then was using cocaine.  At the time of admission, his alcohol blood level  was 204.  He reportedly had a history of bipolar disorder.  He was in this  hospital 4 months ago for similar problems prior to this admission.  He  admitted to various depressive symptoms.  He also admitted to noncompliance  with his diabetic medications.  At the time of admission he had no specific  suicidal plans.   MENTAL STATUS AT THE TIME OF THE INITIAL EVALUATION:  Revealed an alert,  oriented male.  He was somewhat drowsy but was cooperative.  Speech was  easily understood.  Mood was generally euthymic.  There was no evidence of  any suicidal ideation or threats towards others.  There was no evidence of  any thought disorder or other psychosis.  He considered his main problem as  alcohol abuse and wanting to get off the alcohol once again.  Other  pertinent history can be obtained from the psychosocial service summary.   PHYSICAL EXAMINATION:  Essentially normal.   ADMITTING DIAGNOSIS:   AXIS I:  1. Rule out benzodiazapine abuse versus dependence.  2. Bipolar disorder.  3. Alcohol abuse.  4. Cocaine abuse.   AXIS II:  Deferred.   AXIS III:  1. Diabetes mellitus type 2.  2. Osteoarthritis.  3.  Hypertension.  4. Thrombocytopenia not otherwise specified.  5. Migraine headaches.  6. Leukocytosis not otherwise specified.   AXIS IV:  Moderate.   AXIS V:  30/66.   LABORATORY AND ACCESSORY DATA:  All indicated laboratory examinations were  within normal limits or consistent with his stated medical problems.   HOSPITAL COURSE:  While in the hospital, [name] course was uneventful.  He basically was put on an alcohol withdrawal protocol, which he handled  without any particular difficulty.  He talked about his depression,  particularly towards his separation from his wife, his anger and his  resentment about that, but he seemed to be more accepting about what he had  to do regarding his substance abuse as well as his unhappiness towards his  wife by  the time of discharge.  He consistently denied any suicidal thoughts  while he was in the hospital.  He did have consults regarding the treatment  of his diabetes specifically, and he seemed to be feeling much better  physically as well as emotionally by the time of discharge.  Consequently he  was discharged home.   POST HOSPITAL CARE PLANS:  He has an appointment to see Dr. Elna Breslow on  August 22.   MEDICATIONS AT THE TIME OF DISCHARGE:  1. Lotensin 10 mg daily.  2. Hydrochlorothiazide 12.5 mg daily.  3. Glucophage XR 500 mg tablets.  He takes 2 tablets 2 times daily.  4. Lithium carbonate 300 mg tablets.  He takes 2 2 times a day  5. Amaryl 4 mg daily and 2 mg at 5 p.m.  6. Depakote ER 250 mg twice daily and 500 mg at bedtime.  7. Novolin and other diabetic medications as prescribed by his outpatient     doctor.  8. Gabitril 4 mg 3 times daily.  9. K-Dur 20 mEq daily.  10.      Trazodone 100 mg tablets, taking 3 at bedtime.   DIET AND ACTIVITY:  He was instructed to follow his diabetic diet as  prescribed by his outpatient doctor.  There were no other restrictions  placed on his diet or his activity.                                                 Carolanne Grumbling, MD    GT/MEDQ  D:  01/14/2002  T:  01/14/2002  Job:  [id number]

## 2010-10-08 NOTE — H&P (Signed)
NAME:  [name], [name]                           ACCOUNT NO.:  [id number]   MEDICAL RECORD NO.:  [id number]                   PATIENT TYPE:  IPS   LOCATION:  0500                                 FACILITY:  [facility]   PHYSICIAN:  Reymundo Poll. Dub Mikes, M.D.                DATE OF BIRTH:  1956-05-12   DATE OF ADMISSION:  12/31/2001  DATE OF DISCHARGE:                         PSYCHIATRIC ADMISSION ASSESSMENT   CHIEF COMPLAINT:  I ran out of Klonopin, then got depressed, then started  on the alcohol and the cocaine.   PATIENT IDENTIFICATION:  This 55 year old Caucasian male was a voluntary  admission.   HISTORY OF PRESENT ILLNESS:  This patient with a history of bipolar disorder  relapsed on alcohol and cocaine after running out of Klonopin prescription.  He reports that he had not been keeping regular appointments with his  psychiatrist in order to get his medications renewed and is not clear if he  was really taking his Klonopin according to the way it was prescribed.  He  complained of then feeling down and depression without his Klonopin and  stated that he had feelings that he has not wanted to get out of bed.  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  He admits noncompliance with all of his  medications including his diabetes medications.  He denies any specific  suicidal ideation or homicidal ideation, denies any auditory and visual  hallucinations.  He relapsed on alcohol last week, drinking since Friday and  his alcohol level was 204 in the emergency room.  He also relapsed on  cocaine, using cocaine once last week.   PAST PSYCHIATRIC HISTORY:  The patient has been followed by Dr. Elna Breslow  at [facility].  This is second [facility] admission with his last one being approximately  four months ago.   The patient has a history of being diagnosed with bipolar  disorder about four years ago in [place], [place], after a long  history of mood fluctuations, finding himself going for days without needing  any sleep, wanting to work around the clock, sometimes two and three days in  a row; also a history of getting overly involved in a number business  ventures.  Then also experiencing periods of depression where he would find  himself unable to cope with his business responsibilities and being unable  to get out of bed.  The patient denies any history of prior suicide  attempts.  He has a long history of medication noncompliance and a history  of alcohol and cocaine abuse.   SUBSTANCE ABUSE HISTORY:  Already noted above.  The patient's urine drug  screen was positive for cocaine, negative for other substances.  The patient  ETOH level in the emergency room was 204.  He reports that he had been clean  from alcohol and substances up until last week.   PAST MEDICAL HISTORY:  The patient has been followed by [facility] but he  is there only erratically, is not getting regular followup.  Medical  problems: Diabetes mellitus type 2, migraine headaches, osteoarthritis of  his knees and back, and hypertension.  On his previous admission here  approximately four months ago, the patient was seen by [facility]  and diagnosed with diabetes mellitus type 2 and placed on a treatment  regimen.  The patient has a history of thrombocytopenia, possibly due to  substance abuse.   MEDICATIONS:  1. Klonopin 2 mg t.i.d.  2. Lithium 300 mg t.i.d.  3. Amaryl 4 mg p.o. q.d.  4. Metformin XR 1000 mg p.o. b.i.d.  5. Lotensin 12.5 mg daily for hypertension.   It should be noted that the patient has been noncompliant with about all of  his medications, not taking them regularly.   DRUG ALLERGIES:  None.   PHYSICAL EXAMINATION:  The patient's physical examination was done at the  [facility]  Emergency Department where it was essentially negative.   VITAL SIGNS:  On admission to the unit, temperature 97.2, pulse 95,  respirations 20, blood pressure 154/97.  He weighed 234 pounds and he 5 feet  11 inches tall.   LABORATORY DATA:  Alcohol level was found to be 204.  Metabolic panel was  remarkable a glucose elevated at 257.  Creatinine 1.0, BUN 8.  Electrolytes  were within normal limits.  Liver enzymes were not done.  CBC was remarkable  for an elevated WBC of 14.3 with absolute granulocytes elevated at 9.2 and  lymphocytes elevated at 4.1, platelets decreased at 166,000, hemoglobin  18.3, hematocrit 85.1, MCV 89.9.  Pulse oximetry was 95% on room air.  Urinalysis showed glucose greater than 1000.  Total protein in his urine was  100 mg/dL.  Leukocyte esterase was negative.  Specific gravity 1.017.   SOCIAL HISTORY:  The patient is from the [place] area originally.  He  has been back and forth, living in [place] with his brother down here  for the past eight years.  He is currently living with his girlfriend.  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  He has been married previously; his last marriage broke up  approximately six months ago.  He has two grown children.  He denies any  current legal problems.  He has been working as a Recruitment consultant here in  [place] until his last hospitalization and his current psychiatrist  has him on short-term disability.  He denies any current legal charges.   FAMILY HISTORY:  Both a father and a mother with histories of alcohol abuse.   MENTAL STATUS EXAM:  This is an overweight Caucasian male with an  appropriate affect.  He is somewhat drowsy but is generally cooperative and  pleasant with grossly normal motor movements and steady gait.  Speech is normal and spontaneous, fairly articulate.  Mood is  generally euthymic.  He  is somewhat casual and apathetic about his noncompliance with medications  and therapy and about his substance abuse.  Thought process is generally  logical and coherent.  No evidence of suicidal ideation or homicidal  ideation today, no auditory and visual hallucinations.  He can track well  with conversation.  He is not particularly distractible.  He does not feel  that he is really abusing alcohol and that this is a problem.  He has  considerable denial of the impact of his alcohol abuse and his condition.  Cognitive: Intact and oriented x 3.  Intelligence is above average.  Insight: Poor.  Impulse control and judgment: Questionable.   ADMISSION DIAGNOSES:  Axis I:  1. Rule out benzodiazepine abuse versus dependence.  2. Bipolar disorder, rule out rapid cycling.  3. Alcohol abuse, rule out dependence.  4. Cocaine abuse.  Axis II:    Deferred.  Axis III:  1. Diabetes mellitus type 2.  2. Osteoarthritis.  3. Hypertension.  4. Thrombocytopenia, not otherwise specified.  5. Migraine headaches.  6. Leukocytosis, not otherwise specified.  Axis IV:    Moderate problems with both medical and mental health issues  that the patient has trouble coping with.  Having a supportive daughter is  an asset to the patient and she encourages him to obtain treatment.  Axis V:     Current 30, past year 58.   INITIAL PLAN OF CARE:  Plan is a voluntary admission to stabilize his mood  and treat Klonopin withdrawal.  Goal is safe detoxification in five days and  stabilize his mood.  His lithium level and thyroid panel are currently  pending.  We will also check a liver panel on him.  We are not going to  recheck a CBC at this time since he has absolutely no focal symptoms related  to his elevated leukocytosis but we will watch his vital signs and watch him  for any symptom development.  We have elected to place him on a  phenobarbital protocol and he is tolerating that well at  this time.  We will  continue his lithium at 300 mg p.o. b.i.d. and will start him on Vistaril 25  mg one to two tablets q.6.h. p.r.n. should he have any anxiety.  We have  discontinued his Klonopin and restarted his diabetes medicines but because  he has been noncompliant with those, we will start with Amaryl 2 mg tablet  one and a half tablets daily and Glucophage XR 1000 mg p.o. b.i.d. and  monitor his response.  We have strictly cautioned the patient regarding his  need for medical followup and regular laboratory tests while taking lithium  and the potential impact this could have on his kidneys and reflects that he  does understand this is important and also the importance of maintaining  good health and regular monitoring on his diabetes and the interaction this  can have with both his regular health and his psychiatric medications.   ESTIMATED LENGTH OF STAY:  Five days.     Viviann Spare, NP                      Reymundo Poll. Dub Mikes, M.D.    MAS/MEDQ  D:  12/31/2001  T:  01/06/2002  Job:  [id number]

## 2010-10-08 NOTE — Op Note (Signed)
NAMECHRISTOFFER, [name]                 ACCOUNT NO.:  [id number]   MEDICAL RECORD NO.:  [id number]          PATIENT TYPE:  AMB   LOCATION:  SDS                          FACILITY:  [facility]   PHYSICIAN:  Alford Highland. Rankin, M.D.   DATE OF BIRTH:  Dec 11, 1955   DATE OF PROCEDURE:  07/24/2006  DATE OF DISCHARGE:                               OPERATIVE REPORT   PREOPERATIVE DIAGNOSES:  1. Rhegmatogenous retinal detachment, left eye - macula off.  2. Lattice degeneration, left eye.   POSTOPERATIVE DIAGNOSES:  1. Rhegmatogenous retinal detachment, left eye - macula off.  2. Lattice degeneration, left eye.   PROCEDURES:  1. Posterior vitrectomy and membrane, left eye.  2. Endolaser panphotocoagulation, left eye - panretinal      photocoagulation for retinopexy, 360 degrees.  3. Injection of vitreous substitute, C3F8 10%, left eye.   SURGEON:  Alford Highland. Rankin, M.D.   ANESTHESIA:  Local retrobulbar with monitored anesthesia control.   INDICATIONS FOR PROCEDURE:  The patient is a 55 year old man with  profound vision loss for over 2 to 3 weeks, macula-off detachment,  apparent lattice degeneration, and some signs of subretinal band  formation inferonasally suggestive of the significant chronicity.  The  patient understands this is an attempt to reattach the retina.  He  understands the risks of anesthesia, including the rare occurrence of  death, but also to the eye, including but not limited to hemorrhage,  infection, scarring, need for another surgery, no change in vision, loss  of vision and progression of disease despite intervention.  Appropriate  signed consent was obtained and the patient was taken to the operating  room.   DESCRIPTION OF PROCEDURES:  In the operating room, appropriate  monitoring was followed by mild sedation.  Marcaine 0.75% was delivered,  5 mL retrobulbar, followed by an additional 5 mL laterally in the  fashion of a modified [employer]  The left periocular region was  sterilely prepped and draped in the usual ophthalmic fashion.  A lid  speculum was applied.  A 25-gauge trocar system was then used to place  the infusion cannula.  Superior trocars were applied.  Core vitrectomy  was then begun.  The vitreous attachments to the posterior pole were  identified and these were removed.  Vitreous was then trimmed using  shaving techniques over the identified lattice degeneration with a  retinal hole at the 1-o'clock position, but also, what appeared to be  the cause of the hole in the 2-o'clock position in the bed of the  detachment.   Fluid-fluid exchange was then carried out to remove the thick subretinal  fluid through this initial hole.   The decision was made to do a nice vitreous shaving to remove all the  peripheral vitreous attachments and shave them down so as to avoid  peripheral contracture, but also to go ahead and create a posterior  retinotomy for drainage of subretinal fluid posteriorly.  The retinotomy  was then created using a forceps, as well as vitrectomy instrumentation  in an avascular part of the retina, inferonasal to the optic nerve.  Fluid-fluid exchange was then completed, and the thick subretinal fluid  was removed in this fashion.   At this time, fluid-air exchange was completed.  This reaccumulation of  fluid posteriorly was confirmed on several occasions.  At this time,  endolaser photocoagulation was placed peripherally 360 degrees, the bed  of the retinal tear, as well as around the lattice degeneration.   At this time, the fluid-air exchange also permitted posterior fluid  drainage through the retinotomy site.  Endolaser photocoagulation was  placed also around the retinotomy site and the retina reattached nicely.   At this time, an air-C3F8 10% exchange was completed.  No complications  occurred.  The superior trocars were removed prior to the C3F8 exchange,  and only 1 was used to insure a complete exchange occurred.   All trocars  were now removed.  Intraocular pressure was assessed and found to be  adequate at less than 20.  Subconjunctival  injection of steroid  applied.  A sterile patch and a Fox shield were applied.  The patient  tolerated the procedure without complication and was taken to the Short  Stay area.      Alford Highland Rankin, M.D.  Electronically Signed     GAR/MEDQ  D:  07/24/2006  T:  07/24/2006  Job:  [id number]

## 2010-10-08 NOTE — Discharge Summary (Signed)
NAME:  [name], [name]                           ACCOUNT NO.:  [id number]   MEDICAL RECORD NO.:  [id number]                   PATIENT TYPE:  IPS   LOCATION:  0500                                 FACILITY:  [facility]   PHYSICIAN:  Reymundo Poll. Dub Mikes, M.D.                DATE OF BIRTH:  [birth year]   DATE OF ADMISSION:  11/23/2001  DATE OF DISCHARGE:  11/26/2001                                 DISCHARGE SUMMARY   CHIEF COMPLAINT AND PRESENT ILLNESS:  This is the first admission to [facility] for this 55 year old male voluntarily admitted,  divorced.  History of bipolar disorder.  Presented to the emergency room  requesting to be detoxed from alcohol.  The patient was sick and tired of  drinking and wanted to be off of alcohol.  Heavy use for the past five years  since he divorced his wife.  Drinking 7-8 beers daily for several months.  Has occasional use of cocaine.   PAST PSYCHIATRIC HISTORY:  Bipolar disorder diagnosed five years ago in  [place].  He was prescribed Depakote.  He never took it.  History of  taking Celexa and Lexapro for depression.  There is no consistent use of  medications or treatment.  Prior admission to [facility] in [place] for  detox.  ADS in 2000.   ALCOHOL/DRUG HISTORY:  Urine drug screen was positive for cocaine.  He also  admitted to alcohol.   PAST MEDICAL HISTORY:  High blood sugar.  Takes Glucophage intermittently.   MEDICATIONS:  Was not taking anything.   PHYSICAL EXAMINATION:  Performed and failed to show any acute findings.   MENTAL STATUS EXAM:  Medium-built, generally healthy-appearing, disheveled  male fully alert, in no acute distress.  Normal motor behavior.  Sensory  grossly intact.  His affect is appropriate, pleasant, cooperative and  polite.  Speech was normal, relevant and relatively articulate.  Mood is  euthymic.  Thought processes logical and coherent.  Generally goal directed.  Candid about his substance use and his  alcohol use.  Superficial insight.   ADMISSION DIAGNOSES:   AXIS I:  1. Alcohol dependence.  2. Cocaine abuse.  3. Bipolar disorder, by history.   AXIS II:  No diagnosis.   AXIS III:  1. Diabetes mellitus, type 2.  2. _________ by history.  3. Thrombocytopenia.   AXIS IV:  Moderate.   AXIS V:  Global Assessment of Functioning on admission 38; highest Global  Assessment of Functioning in the last year 68.   LABORATORY DATA:  SGOT 35, SGPT 42.  Hemoglobin A1C was 11.9.  Lipid profile  with cholesterol 183, triglyceride 248.   HOSPITAL COURSE:  He was admitted and started intensive individual and group  psychotherapy.  Admitted to mood swings.  Never on a mood stabilizer.  Started antidepressants unsuccessfully.  Willing to use a mood stabilizer  and give it a  good try.  He was started on lithium 300 mg twice a day.  Tolerating the lithium.  He was given some Seroquel for the agitation and  the anxiety.  On November 22, 2001, he was much better.  He denied any suicidal  or homicidal ideation.  He was wanting to be discharged to outpatient  treatment and was willing and committed to and continued to use the lithium  and give it a good try.   DISCHARGE DIAGNOSES:   AXIS I:  1. Alcohol dependence.  2. Cocaine abuse.  3. Bipolar disorder.   AXIS II:  No diagnosis.   AXIS III:  1. Diabetes mellitus, type 2.  2. __________ versicolor.  3. Thrombocytopenia.   AXIS IV:  Moderate.   AXIS V:  Global Assessment of Functioning upon discharge 55-60.   DISCHARGE MEDICATIONS:  1. Amaryl 2 mg, 1-1/2 daily.  2. Vistaril 25 mg, 1-2 every six hours as needed for anxiety.  3. Lithobid 300 mg twice a day.  4. Glucophage XR 500 mg, 2 twice a day.  5. Nizoral cream, apply to rash.   FOLLOW UP:  Elna Breslow as well as [facility].                                               Madie Reno A. Dub Mikes, M.D.    IAL/MEDQ  D:  12/26/2001  T:  12/29/2001  Job:  [id number]

## 2010-10-08 NOTE — H&P (Signed)
NAME:  [name], [name]                           ACCOUNT NO.:  [id number]   MEDICAL RECORD NO.:  [id number]                   PATIENT TYPE:  IPS   LOCATION:  0506                                 FACILITY:  BH   PHYSICIAN:  Jeanice Lim, M.D.              DATE OF BIRTH:  [birth year]   DATE OF ADMISSION:  03/25/2002  DATE OF DISCHARGE:                         PSYCHIATRIC ADMISSION ASSESSMENT   DATE OF ASSESSMENT:  March 26, 2002, at 8:00 a.m.   CHIEF COMPLAINT:  My mood was crazy.  I knew I had to get back to doing  right by my meds.   PATIENT IDENTIFICATION:  This is a 55 year old white male who is divorced,  voluntary admission.   HISTORY OF PRESENT ILLNESS:  This patient called police last night stating  that he wanted to end his life.  They came out to the house and took him to  the emergency room where he was medically cleared.  He has a history of  bipolar disorder and has relapsed on alcohol, drinking about a 12 pack of  beer daily, and had also relapsed on some cocaine over the past two to three  days, smoking three to four rocks about two days ago.  He reports that when  he was discharged from [facility] the last time in August 2003, he was doing quite  well and then he gradually stopped taking his Depakote many weeks ago,  somewhat later stopped taking his lithium, then began using Klonopin for  nerves.  He also stopped using Gabitril, which he was previously taking.  His mood began to be worse very gradually over the past few weeks, fueled in  part by some disruption to his lifestyle at home with extra relatives living  in the home.  He was also splitting his Klonopin prescription with his  brother who did not have any medication.  The patient began using substances  about a week ago because he was having cravings to have a high.  Yesterday  he felt suicidal without any specific plan.  He denies any auditory and  visual hallucinations, denies any homicidal ideation.   PAST PSYCHIATRIC HISTORY:  The patient has been followed by Christene Slates.  Katrinka Blazing, M.D. whom he last saw approximately two weeks.  This is his third  [facility] admission with his last one being August  2003.  He does have a long history of substance abuse, abusing primarily  cocaine and alcohol for many years.  He has been able to stay clean and  sober up until about a week ago since August with the help of attending  regular AA meetings, which he plans to return to.  He is supported in  sobriety by his girlfriend who also attends AA meetings.   SUBSTANCE ABUSE HISTORY:  As noted above.  The patient, most recently, has  also been abusing his benzodiazepines, getting a  prescription for Klonopin  up to 2 mg t.i.d., which he has been splitting with his brother.   PAST MEDICAL HISTORY:  The patient has been referred in the past to [facility] for ongoing medical care but has never kept any appointments  there.  He does not have a regular primary care [facility] Messamore.  Medical problems  include a history of diabetes mellitus type 2 initially diagnosed and  treated this summer here by internal medicine department.  The patient also  has a history of hypertension, osteoarthritis, migraine headaches, although  he denies having any of those recently, and some thrombocytopenia, possibly  secondary to his substance abuse.   MEDICATIONS:  1. Lithium in the past, although he has taken none recently.  He was     previously stabilized on lithium carbonate 600 mg b.i.d.  2. He has taken Depakote 250 mg b.i.d. and 500 mg at bedtime.  3. Gabitril 4 mg t.i.d.  4. Trazodone 100 mg three at bedtime.   He has been quite noncompliant with all of his psychiatric medicines, as  previously noted.  The patient has, in the past, been stabilized on:  1. Glucophage XR 1000 mg b.i.d.  2. Hydrochlorothiazide 12.5 mg daily.  3. Lotensin 10 mg daily.  4. Amaryl 4 mg in the morning and 2 mg at  5 p.m.  5. He has taken Novolin insulin in the past on a sliding scale basis but has     not been using that at home to stabilize his diabetes.  6. K-Dur 20 mEq daily.   The patient obtains his medications at the [facility] on [address] in [place].   DRUG ALLERGIES:  None.   PHYSICAL EXAMINATION:  GENERAL:  He denies any somatic complaints related to  infection, even though he does have a mildly elevated white count. The  patient's physical examination was done  in the emergency room and is  essentially unremarkable.   LABORATORY DATA:  The patient's urine drug screen was positive for both  benzodiazepines and cocaine.  His alcohol level in the emergency room was  51.  CBC reveals mildly elevated white count 12,700, hemoglobin 16.7,  hematocrit 47.6, MCV 88.7, platelets 126,000 on a normal scale of 150,000-  400,000.  Routine chemistry reveals electrolytes within normal limits.  Glucose was elevated at 184, BUN 12, creatinine 0.8.  Thyroid panel is  currently pending as is his lithium level and we will plan a Depakote level.  His fasting CBG this morning was 156.   SOCIAL HISTORY:  The patient is divorced for many years.  He has a Systems analyst.  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  He has two grown children.  He is currently on  disability for his mood disorder.  He lives in a stable relationship here in  [place] with a stable girlfriend.  He denies any legal problems at this  time.   FAMILY HISTORY:  Family history is remarkable for several family members  with problems with polysubstance abuse.   MENTAL STATUS EXAM:  This is an overweight, middle-aged male who is sleepy  but responsive with grossly normal motor exam.  He is cooperative with a  somewhat blunted affect.  Speech is normal without pressure and it is spontaneous.  He is fairly articulate.  Mood is mildly depressed.  He feels   somewhat guilty about his relapse on alcohol.  Thought process is logical  without deficits, no evidence of homicidal ideation, no psychosis.  He does  have some vague suicidal ideation related to his relapse and his depressed  mood but no specific plan.  Cognitive: Intact and oriented x 3.  Intelligence is above average.  Insight is very poor.  Judgment: Poor  Impulse control: Within normal limits.   ADMISSION DIAGNOSES:   AXIS I:  1. Bipolar I disorder, not otherwise specified.  2. Polysubstance abuse and dependence including benzodiazepine abuse and     dependence.   AXIS II:  Deferred.    AXIS III:  1. Diabetes mellitus type 2.  2. Hypertension.  3. Thrombocytopenia.   AXIS IV:  Moderate domestic stress with care giving stress for his  girlfriend's mother and chronic medication noncompliance.  Having a  supportive girlfriend is definitely an asset to him.   AXIS V:  Current 30, past year 54.   INITIAL PLAN OF CARE:  Plan is to voluntarily admit the patient to detoxify  him from both Klonopin and alcohol.  We have elected to start him on Librium  25 mg p.o. q.6.h. on a p.r.n. basis for any withdrawal symptoms.  We will  not be restarting his Klonopin at this time.  Meanwhile, we will also get a  TSH, a Lithium level, although he states that he has been noncompliant with  this and we will get a Depakote level on November 6.  We have elected to  restart his Depakote at 250 mg q.a.m. and 3 p.m. and 500 mg q.h.s.  We have  also restarted him on his trazodone 300 mg at h.s. and we will start him on  dual diagnosis programming and enroll him immediately in intensive group and  individual psychotherapy.  We will hold off on starting his lithium until we  get some additional labs back and check a lithium level on him.  Meanwhile,  we will ask internal medicine to follow him for management of his diabetes  and hypertension and we will continue to check routine CBGs on him.    ESTIMATED LENGTH OF STAY:  Five to six days.     Margaret A. Stephannie Peters                   Jeanice Lim, M.D.    MAS/MEDQ  D:  03/26/2002  T:  03/26/2002  Job:  [id number]

## 2011-01-07 ENCOUNTER — Inpatient Hospital Stay (HOSPITAL_COMMUNITY): Admission: AD | Admit: 2011-01-07 | Payer: Medicare Other | Source: Ambulatory Visit | Admitting: Psychiatry

## 2011-01-07 ENCOUNTER — Emergency Department (HOSPITAL_COMMUNITY)
Admission: EM | Admit: 2011-01-07 | Discharge: 2011-01-07 | Disposition: A | Discharge status: Home / Self Care | Payer: Medicare PPO | Attending: Emergency Medicine | Admitting: Emergency Medicine

## 2011-01-07 DIAGNOSIS — I10 Essential (primary) hypertension: Secondary | ICD-10-CM | POA: Insufficient documentation

## 2011-01-07 DIAGNOSIS — D696 Thrombocytopenia, unspecified: Secondary | ICD-10-CM | POA: Insufficient documentation

## 2011-01-07 DIAGNOSIS — F1410 Cocaine abuse, uncomplicated: Secondary | ICD-10-CM | POA: Insufficient documentation

## 2011-01-07 DIAGNOSIS — F1010 Alcohol abuse, uncomplicated: Secondary | ICD-10-CM | POA: Insufficient documentation

## 2011-01-07 DIAGNOSIS — E119 Type 2 diabetes mellitus without complications: Secondary | ICD-10-CM | POA: Insufficient documentation

## 2011-01-07 LAB — CBC
MCV: 96.2 fL (ref 78.0–100.0)
Platelets: 77 10*3/uL — ABNORMAL LOW (ref 150–400)
RBC: 4.46 MIL/uL (ref 4.22–5.81)
WBC: 6.1 10*3/uL (ref 4.0–10.5)

## 2011-01-07 LAB — COMPREHENSIVE METABOLIC PANEL
ALT: 38 U/L (ref 0–53)
AST: 64 U/L — ABNORMAL HIGH (ref 0–37)
Alkaline Phosphatase: 105 U/L (ref 39–117)
CO2: 23 mEq/L (ref 19–32)
Chloride: 99 mEq/L (ref 96–112)
GFR calc non Af Amer: >60 mL/min (ref >60)
Sodium: 134 mEq/L — ABNORMAL LOW (ref 135–145)
Total Bilirubin: 0.8 mg/dL (ref 0.3–1.2)

## 2011-01-07 LAB — RAPID URINE DRUG SCREEN, HOSP PERFORMED
Amphetamines: NOT DETECTED
Barbiturates: NOT DETECTED
Opiates: NOT DETECTED
Tetrahydrocannabinol: NOT DETECTED

## 2011-01-07 LAB — GLUCOSE, CAPILLARY
Glucose-Capillary: 272 mg/dL — ABNORMAL HIGH (ref 70–99)
Glucose-Capillary: 252 mg/dL — ABNORMAL HIGH (ref 70–99)

## 2011-01-07 LAB — DIFFERENTIAL
Eosinophils Absolute: 0.4 10*3/uL (ref 0.0–0.7)
Eosinophils Relative: 7 % — ABNORMAL HIGH (ref 0–5)
Monocytes Absolute: 0.5 10*3/uL (ref 0.1–1.0)
Neutrophils Relative %: 54 % (ref 43–77)

## 2011-01-12 ENCOUNTER — Emergency Department (HOSPITAL_COMMUNITY)
Admission: EM | Admit: 2011-01-12 | Discharge: 2011-01-12 | Disposition: A | Discharge status: Other Acute Inpatient Hospital | Payer: Medicare PPO | Source: Home / Self Care | Attending: Emergency Medicine | Admitting: Emergency Medicine

## 2011-01-12 ENCOUNTER — Inpatient Hospital Stay (HOSPITAL_COMMUNITY)
Admission: RE | Admit: 2011-01-12 | Discharge: 2011-01-14 | DRG: 897 | Disposition: A | Discharge status: Home / Self Care | Payer: Medicare PPO | Source: Intra-hospital | Attending: Psychiatry | Admitting: Psychiatry

## 2011-01-12 DIAGNOSIS — Z6379 Other stressful life events affecting family and household: Secondary | ICD-10-CM

## 2011-01-12 DIAGNOSIS — F1320 Sedative, hypnotic or anxiolytic dependence, uncomplicated: Secondary | ICD-10-CM

## 2011-01-12 DIAGNOSIS — F17200 Nicotine dependence, unspecified, uncomplicated: Secondary | ICD-10-CM

## 2011-01-12 DIAGNOSIS — I10 Essential (primary) hypertension: Secondary | ICD-10-CM | POA: Insufficient documentation

## 2011-01-12 DIAGNOSIS — F1010 Alcohol abuse, uncomplicated: Secondary | ICD-10-CM | POA: Insufficient documentation

## 2011-01-12 DIAGNOSIS — G609 Hereditary and idiopathic neuropathy, unspecified: Secondary | ICD-10-CM

## 2011-01-12 DIAGNOSIS — Z794 Long term (current) use of insulin: Secondary | ICD-10-CM

## 2011-01-12 DIAGNOSIS — F1020 Alcohol dependence, uncomplicated: Principal | ICD-10-CM

## 2011-01-12 DIAGNOSIS — F319 Bipolar disorder, unspecified: Secondary | ICD-10-CM | POA: Insufficient documentation

## 2011-01-12 DIAGNOSIS — F329 Major depressive disorder, single episode, unspecified: Secondary | ICD-10-CM

## 2011-01-12 DIAGNOSIS — Z9181 History of falling: Secondary | ICD-10-CM

## 2011-01-12 DIAGNOSIS — F1120 Opioid dependence, uncomplicated: Secondary | ICD-10-CM

## 2011-01-12 DIAGNOSIS — F3289 Other specified depressive episodes: Secondary | ICD-10-CM

## 2011-01-12 DIAGNOSIS — Z79899 Other long term (current) drug therapy: Secondary | ICD-10-CM

## 2011-01-12 DIAGNOSIS — E119 Type 2 diabetes mellitus without complications: Secondary | ICD-10-CM

## 2011-01-12 DIAGNOSIS — G8929 Other chronic pain: Secondary | ICD-10-CM

## 2011-01-12 LAB — POCT I-STAT, CHEM 8
Chloride: 101 mEq/L (ref 96–112)
Creatinine, Ser: 0.8 mg/dL (ref 0.50–1.35)
Glucose, Bld: 369 mg/dL — ABNORMAL HIGH (ref 70–99)
HCT: 43 % (ref 39.0–52.0)
Hemoglobin: 14.6 g/dL (ref 13.0–17.0)
Potassium: 4.1 mEq/L (ref 3.5–5.1)
Sodium: 136 mEq/L (ref 135–145)

## 2011-01-12 LAB — DIFFERENTIAL
Basophils Absolute: 0 10*3/uL (ref 0.0–0.1)
Basophils Relative: 0 % (ref 0–1)
Eosinophils Absolute: 0.4 10*3/uL (ref 0.0–0.7)
Lymphocytes Relative: 29 % (ref 12–46)
Monocytes Relative: 10 % (ref 3–12)
Neutrophils Relative %: 54 % (ref 43–77)

## 2011-01-12 LAB — CBC
Hemoglobin: 13.8 g/dL (ref 13.0–17.0)
Platelets: 75 10*3/uL — ABNORMAL LOW (ref 150–400)
RBC: 4.05 MIL/uL — ABNORMAL LOW (ref 4.22–5.81)
WBC: 5.1 10*3/uL (ref 4.0–10.5)

## 2011-01-12 LAB — LIPASE, BLOOD: Lipase: 85 U/L — ABNORMAL HIGH (ref 11–59)

## 2011-01-12 LAB — RAPID URINE DRUG SCREEN, HOSP PERFORMED
Amphetamines: NOT DETECTED
Benzodiazepines: POSITIVE — AB
Cocaine: NOT DETECTED
Opiates: NOT DETECTED
Tetrahydrocannabinol: NOT DETECTED

## 2011-01-12 LAB — COMPREHENSIVE METABOLIC PANEL
Alkaline Phosphatase: 94 U/L (ref 39–117)
BUN: 10 mg/dL (ref 6–23)
Calcium: 9.1 mg/dL (ref 8.4–10.5)
Creatinine, Ser: 0.65 mg/dL (ref 0.50–1.35)
GFR calc Af Amer: >60 mL/min (ref >60)
Glucose, Bld: 363 mg/dL — ABNORMAL HIGH (ref 70–99)
Total Protein: 7.3 g/dL (ref 6.0–8.3)

## 2011-01-12 LAB — GLUCOSE, CAPILLARY
Glucose-Capillary: 223 mg/dL — ABNORMAL HIGH (ref 70–99)
Glucose-Capillary: 315 mg/dL — ABNORMAL HIGH (ref 70–99)
Glucose-Capillary: 277 mg/dL — ABNORMAL HIGH (ref 70–99)

## 2011-01-13 DIAGNOSIS — F1320 Sedative, hypnotic or anxiolytic dependence, uncomplicated: Secondary | ICD-10-CM

## 2011-01-13 DIAGNOSIS — F1020 Alcohol dependence, uncomplicated: Secondary | ICD-10-CM

## 2011-01-13 DIAGNOSIS — F17200 Nicotine dependence, unspecified, uncomplicated: Secondary | ICD-10-CM

## 2011-01-13 DIAGNOSIS — F1120 Opioid dependence, uncomplicated: Secondary | ICD-10-CM

## 2011-01-13 LAB — GLUCOSE, CAPILLARY: Glucose-Capillary: 377 mg/dL — ABNORMAL HIGH (ref 70–99)

## 2011-01-14 LAB — GLUCOSE, CAPILLARY
Glucose-Capillary: 144 mg/dL — ABNORMAL HIGH (ref 70–99)
Glucose-Capillary: 239 mg/dL — ABNORMAL HIGH (ref 70–99)
Glucose-Capillary: 295 mg/dL — ABNORMAL HIGH (ref 70–99)

## 2011-01-24 ENCOUNTER — Emergency Department (HOSPITAL_COMMUNITY)
Admission: EM | Admit: 2011-01-24 | Discharge: 2011-01-24 | Disposition: A | Discharge status: Home / Self Care | Payer: Medicare PPO | Attending: Emergency Medicine | Admitting: Emergency Medicine

## 2011-01-24 DIAGNOSIS — Z91199 Patient's noncompliance with other medical treatment and regimen due to unspecified reason: Secondary | ICD-10-CM | POA: Insufficient documentation

## 2011-01-24 DIAGNOSIS — M7989 Other specified soft tissue disorders: Secondary | ICD-10-CM | POA: Insufficient documentation

## 2011-01-24 DIAGNOSIS — R609 Edema, unspecified: Secondary | ICD-10-CM | POA: Insufficient documentation

## 2011-01-24 DIAGNOSIS — I10 Essential (primary) hypertension: Secondary | ICD-10-CM | POA: Insufficient documentation

## 2011-01-24 DIAGNOSIS — Z9119 Patient's noncompliance with other medical treatment and regimen: Secondary | ICD-10-CM | POA: Insufficient documentation

## 2011-01-24 DIAGNOSIS — G609 Hereditary and idiopathic neuropathy, unspecified: Secondary | ICD-10-CM | POA: Insufficient documentation

## 2011-01-24 DIAGNOSIS — M79609 Pain in unspecified limb: Secondary | ICD-10-CM | POA: Insufficient documentation

## 2011-01-24 DIAGNOSIS — G8929 Other chronic pain: Secondary | ICD-10-CM | POA: Insufficient documentation

## 2011-01-24 DIAGNOSIS — E119 Type 2 diabetes mellitus without complications: Secondary | ICD-10-CM | POA: Insufficient documentation

## 2011-01-24 DIAGNOSIS — Z79899 Other long term (current) drug therapy: Secondary | ICD-10-CM | POA: Insufficient documentation

## 2011-01-24 DIAGNOSIS — Z794 Long term (current) use of insulin: Secondary | ICD-10-CM | POA: Insufficient documentation

## 2011-01-24 LAB — GLUCOSE, CAPILLARY: Glucose-Capillary: 186 mg/dL — ABNORMAL HIGH (ref 70–99)

## 2011-01-24 LAB — POCT I-STAT, CHEM 8
Calcium, Ion: 1.15 mmol/L (ref 1.12–1.32)
Chloride: 107 mEq/L (ref 96–112)
HCT: 39 % (ref 39.0–52.0)
TCO2: 20 mmol/L (ref 0–100)

## 2011-01-24 LAB — URINALYSIS, ROUTINE W REFLEX MICROSCOPIC
Bilirubin Urine: NEGATIVE
Nitrite: NEGATIVE
Specific Gravity, Urine: 1.019 (ref 1.005–1.030)
Urobilinogen, UA: 1 mg/dL (ref 0.0–1.0)

## 2011-01-24 LAB — DIFFERENTIAL
Basophils Absolute: 0 10*3/uL (ref 0.0–0.1)
Basophils Relative: 1 % (ref 0–1)
Eosinophils Absolute: 0.4 10*3/uL (ref 0.0–0.7)
Eosinophils Relative: 12 % — ABNORMAL HIGH (ref 0–5)

## 2011-01-24 LAB — CBC
Platelets: 56 10*3/uL — ABNORMAL LOW (ref 150–400)
RDW: 13.2 % (ref 11.5–15.5)
WBC: 3.7 10*3/uL — ABNORMAL LOW (ref 4.0–10.5)

## 2011-01-27 NOTE — Assessment & Plan Note (Signed)
NAMEKESEAN, SERVISS NO.:  [id number]  MEDICAL RECORD NO.:  [id number]  LOCATION:  0305                          FACILITY:  BH  PHYSICIAN:  Orson Aloe, MD       DATE OF BIRTH:  10/05/1955  DATE OF ADMISSION:  01/12/2011 DATE OF DISCHARGE:                      PSYCHIATRIC ADMISSION ASSESSMENT   This is a voluntary admission.  This is a 55 year old divorced white male who has been living with his male companion for 21 years.  He called EMS. He asked to be brought to the ED for alcohol detox.  He claims that he has not been taking his psych meds as prescribed and he is not taking his insulin.  When Mr. Basaldua was last with [place] about in January 2011, he was discharged on Neurontin 600 mg q.i.d. and he did not have any bipolar diagnosis.  He was seen to have alcohol dependence, early remission.  He states that he relapsed on alcohol approximately 1- 2 months ago,  that he had been running the [employer].  It came under new management and he parted company with them and relapsed.  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  SOCIAL HISTORY:  He went to the eleventh grade.  He has been married and divorced once.  He has lived with his current companion for 21 years. He does have a son 66, a daughter 47.  He states he became disabled at age 89 from diabetes due to detached retinas, having to have cataracts done, etc.  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  FAMILY HISTORY:  He states that his brother who lives in [place] is a substance abuser.  MEDICAL PROBLEMS:  He is known to have diabetes.  He has had several broken bones from falls, etc., from his prior work in the  [employer].  He has got a plate in his right lower extremity and something in his right arm.  He also suffers from neuropathy, hypertension, diabetes and chronic pain.  His  primary care provider is Dr. Pilar Plate on [facility].  He has no psychiatric care.  MEDICATIONS:  I called and checked with [facility].  He is prescribed: 1. Lantus 30 mg at bedtime subcu. 2. Metformin 500 mg b.i.d. 3. Vicodin 10/325.  He last picked up the 120 of these on July 30.  He     had no opioids in his system. 4. Valium 10 mg t.i.d. This was prescribed on July 13.  He was     positive for benzodiazepines. 5. Elavil 50 mg at bedtime.  He was last prescribed this on July 17.  DRUG ALLERGIES:  He has no known drug allergies.  POSITIVE PHYSICAL FINDINGS:  He was medically cleared in the ED at [facility].  His alcohol level was 182.  His urine drug screen  was positive only for benzodiazepines. His glucose level was elevated at 342 but he was drinking.  His SGOT was elevated at 66. He had no other remarkable laboratory findings.  MENTAL STATUS EXAM:  He is a little bit woozy.  He was medicated prior to coming over from the ED.  He is casually groomed and dressed.  He has changes on his legs consistent with his neuropathy and probable microvascular disease from his diabetes.  His speech was clear, rational and goal oriented.  He wants to get detox. He wants to get back on appropriate medications.  His mood was appropriate to the situation.  He denied being suicidal or homicidal.  He denied having any auditory or visual hallucinations.  DIAGNOSES:  Axis I:  Alcohol dependence.  He reports a relapse of several months. Axis II:  Deferred. Axis III:  Diabetes mellitus, peripheral neuropathy, chronic pain. Axis IV:  Relapse on alcohol, poor coping mechanisms, poor coping skills. Axis V:  35.  PLAN:  Admit for safety and stabilization.  He will be detoxed from alcohol through use of the low-dose Librium  protocol. The need for further psychotropic medications will be assessed and initiated when appropriate.  He is quite knowledgeable about the system and he knows that he needs to call his sponsor and start going to [employer]  ESTIMATED LENGTH OF STAY:  3-5 days.     Mickie Leonarda Salon, P.A.-C.   ______________________________ Orson Aloe, MD    MD/MEDQ  D:  01/12/2011  T:  01/13/2011  Job:  [id number]  Electronically Signed by Jaci Lazier ADAMS P.A.-C. on 01/22/2011 12:05:07 PM Electronically Signed by Orson Aloe  on 01/27/2011 04:22:34 PM

## 2011-01-31 NOTE — Discharge Summary (Signed)
NAMEANNA, LIVERS NO.:  [id number]  MEDICAL RECORD NO.:  [id number]  LOCATION:  0305                          FACILITY:  BH  PHYSICIAN:  Orson Aloe, MD       DATE OF BIRTH:  22-Oct-1955  DATE OF ADMISSION:  01/12/2011 DATE OF DISCHARGE:  01/14/2011                              DISCHARGE SUMMARY   This is a voluntary admission for this 55 year old divorced white male who has been living with his male companion for 21 years.  He called EMS and asked to be brought to the emergency department for alcohol detox.  He claims he has not been taking psych medicines as prescribed and is not taking his insulin.  He was last in the hospital in January 2011 here and was discharged on Neurontin 600 mg 4 times a day without a diagnosis of bipolar disorder.  He was given a diagnosis of alcohol dependence in early remission.  He states he relapsed on alcohol approximately 1 or 2 months ago.  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  It came under new management and he parted company with him and relapsed.  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  Significant findings are he was medically cleared in the emergency room. Alcohol level was 182.  His urine screen was positive only for benzos.  His glucose level was elevated at 342 but he was drinking.  His SGOT was elevated at 66.  No other remarkable laboratory findings were noted.  AXIS I:  Polysubstance dependence including alcohol, opiates, benzos and nicotine. AXIS II: Deferred. AXIS III: Diabetes mellitus on insulin and metformin well as poor eyesight. AXIS IV:  Moderate distress of occupation and living arrangements. AXIS V: 50.  HOSPITAL COURSE:  The patient was admitted and placed on Librium detox. He had a good response to that and he was also noted to have elevated glucoses.  He was placed on  a sliding scale of insulin .  He reconnected with his sponsor and also with the AA meetings and was felt ready to be able to resume his life as a sober person once he got connected with the AA sponsor.  He was given an appointment with [name]  at [address] on August 27 at 7:82 p.m.  DISCHARGE RECOMMENDATIONS: 1. Resume typical activity. 2. Resume a low glycemic diet. 3. Continue the amitriptyline 50 mg at bedtime he has at home as well     as Lantus 20 units insulin he has at home as well as lisinopril 20     mg he has at home and the metformin 500 mg that he is supposed to     take twice.  He is also supposed to stop the Valium. 4. Recommendation was to follow up with [name].  The phone     number is [phone] at [address] on August 27 at 9:56 p.m.     It was also recommended that he call his sponsor twice a day and  attend 90 meetings in 90 days.          ______________________________ Orson Aloe, MD     EW/MEDQ  D:  01/31/2011  T:  01/31/2011  Job:  [id number]  Electronically Signed by Orson Aloe  on 01/31/2011 11:40:14 AM

## 2011-02-16 ENCOUNTER — Emergency Department (HOSPITAL_COMMUNITY)
Admission: EM | Admit: 2011-02-16 | Discharge: 2011-02-16 | Disposition: A | Discharge status: Home / Self Care | Payer: Medicare PPO | Attending: Emergency Medicine | Admitting: Emergency Medicine

## 2011-02-16 DIAGNOSIS — F1910 Other psychoactive substance abuse, uncomplicated: Secondary | ICD-10-CM | POA: Insufficient documentation

## 2011-02-16 DIAGNOSIS — R404 Transient alteration of awareness: Secondary | ICD-10-CM | POA: Insufficient documentation

## 2011-02-16 DIAGNOSIS — Z794 Long term (current) use of insulin: Secondary | ICD-10-CM | POA: Insufficient documentation

## 2011-02-16 DIAGNOSIS — E119 Type 2 diabetes mellitus without complications: Secondary | ICD-10-CM | POA: Insufficient documentation

## 2011-02-16 DIAGNOSIS — I10 Essential (primary) hypertension: Secondary | ICD-10-CM | POA: Insufficient documentation

## 2011-02-16 DIAGNOSIS — IMO0001 Reserved for inherently not codable concepts without codable children: Secondary | ICD-10-CM | POA: Insufficient documentation

## 2011-02-16 LAB — DIFFERENTIAL
Eosinophils Relative: 6 % — ABNORMAL HIGH (ref 0–5)
Lymphocytes Relative: 33 % (ref 12–46)
Lymphs Abs: 1.8 10*3/uL (ref 0.7–4.0)

## 2011-02-16 LAB — CBC
HCT: 39.9 % (ref 39.0–52.0)
Hemoglobin: 14.2 g/dL (ref 13.0–17.0)
MCV: 96.8 fL (ref 78.0–100.0)
RDW: 13.7 % (ref 11.5–15.5)
WBC: 5.5 10*3/uL (ref 4.0–10.5)

## 2011-02-16 LAB — BASIC METABOLIC PANEL
BUN: 10 mg/dL (ref 6–23)
CO2: 25 mEq/L (ref 19–32)
Chloride: 101 mEq/L (ref 96–112)
GFR calc non Af Amer: >60 mL/min (ref >60)
Glucose, Bld: 242 mg/dL — ABNORMAL HIGH (ref 70–99)
Potassium: 3.7 mEq/L (ref 3.5–5.1)

## 2011-02-16 LAB — RAPID URINE DRUG SCREEN, HOSP PERFORMED
Barbiturates: NOT DETECTED
Cocaine: POSITIVE — AB
Opiates: NOT DETECTED

## 2011-02-18 LAB — RAPID URINE DRUG SCREEN, HOSP PERFORMED
Amphetamines: NOT DETECTED
Amphetamines: NOT DETECTED
Benzodiazepines: POSITIVE — AB
Cocaine: NOT DETECTED
Cocaine: NOT DETECTED
Opiates: NOT DETECTED
Tetrahydrocannabinol: NOT DETECTED
Tetrahydrocannabinol: NOT DETECTED

## 2011-02-18 LAB — GLUCOSE, CAPILLARY
Glucose-Capillary: 287 — ABNORMAL HIGH
Glucose-Capillary: 74
Glucose-Capillary: 137 — ABNORMAL HIGH
Glucose-Capillary: 151 — ABNORMAL HIGH
Glucose-Capillary: 170 — ABNORMAL HIGH
Glucose-Capillary: 188 — ABNORMAL HIGH
Glucose-Capillary: 194 — ABNORMAL HIGH
Glucose-Capillary: 195 — ABNORMAL HIGH
Glucose-Capillary: 196 — ABNORMAL HIGH
Glucose-Capillary: 232 — ABNORMAL HIGH
Glucose-Capillary: 258 — ABNORMAL HIGH
Glucose-Capillary: 260 — ABNORMAL HIGH
Glucose-Capillary: 299 — ABNORMAL HIGH

## 2011-02-18 LAB — DIFFERENTIAL
Basophils Absolute: 0
Basophils Absolute: 0
Lymphocytes Relative: 24
Lymphocytes Relative: 33
Lymphs Abs: 1.4
Lymphs Abs: 1.7
Neutro Abs: 2.6
Neutrophils Relative %: 50
Neutrophils Relative %: 68

## 2011-02-18 LAB — ETHANOL
Alcohol, Ethyl (B): 17 — ABNORMAL HIGH
Alcohol, Ethyl (B): <5

## 2011-02-18 LAB — BASIC METABOLIC PANEL
BUN: 15
BUN: 6
Calcium: 9.2
Calcium: 9.3
Creatinine, Ser: 0.83
GFR calc non Af Amer: >60
GFR calc non Af Amer: >60
Glucose, Bld: 260 — ABNORMAL HIGH
Glucose, Bld: 98
Potassium: 3.9

## 2011-02-18 LAB — POCT CARDIAC MARKERS
CKMB, poc: 1.9
Myoglobin, poc: 58

## 2011-02-18 LAB — CBC
HCT: 40.9
Platelets: 69 — ABNORMAL LOW
Platelets: 75 — ABNORMAL LOW
RDW: 13.6
RDW: 15
WBC: 5.2
WBC: 5.6

## 2011-02-21 LAB — POCT I-STAT, CHEM 8
Calcium, Ion: 1.1 — ABNORMAL LOW
Chloride: 104
HCT: 43
Potassium: 4
Sodium: 135

## 2011-02-21 LAB — DIFFERENTIAL
Basophils Absolute: 0
Basophils Relative: 0
Neutro Abs: 2
Neutrophils Relative %: 48

## 2011-02-21 LAB — CBC
MCHC: 34.6
Platelets: 60 — ABNORMAL LOW
RDW: 13.6

## 2011-02-25 LAB — DIFFERENTIAL
Eosinophils Relative: 3 % (ref 0–5)
Lymphocytes Relative: 24 % (ref 12–46)
Lymphs Abs: 1.4 10*3/uL (ref 0.7–4.0)
Neutrophils Relative %: 65 % (ref 43–77)

## 2011-02-25 LAB — BASIC METABOLIC PANEL
BUN: 6 mg/dL (ref 6–23)
GFR calc non Af Amer: >60 mL/min (ref >60)
Potassium: 3.8 mEq/L (ref 3.5–5.1)

## 2011-02-25 LAB — RAPID URINE DRUG SCREEN, HOSP PERFORMED: Barbiturates: NOT DETECTED

## 2011-02-25 LAB — CBC
HCT: 50 % (ref 39.0–52.0)
Platelets: 81 10*3/uL — ABNORMAL LOW (ref 150–400)
WBC: 5.9 10*3/uL (ref 4.0–10.5)

## 2011-02-25 LAB — GLUCOSE, CAPILLARY
Glucose-Capillary: 232 mg/dL — ABNORMAL HIGH (ref 70–99)
Glucose-Capillary: 335 mg/dL — ABNORMAL HIGH (ref 70–99)

## 2011-02-25 LAB — ETHANOL: Alcohol, Ethyl (B): 20 mg/dL — ABNORMAL HIGH (ref 0–10)

## 2011-03-03 LAB — CBC
Hemoglobin: 15.6
MCHC: 34.7
MCV: 93.2
RBC: 4.82

## 2011-03-03 LAB — BASIC METABOLIC PANEL
CO2: 29
Chloride: 100
GFR calc Af Amer: >60
Potassium: 3.7
Sodium: 136

## 2011-03-12 ENCOUNTER — Emergency Department (HOSPITAL_COMMUNITY)
Admission: EM | Admit: 2011-03-12 | Discharge: 2011-03-12 | Disposition: A | Discharge status: Other Acute Inpatient Hospital | Payer: Medicare PPO | Attending: Emergency Medicine | Admitting: Emergency Medicine

## 2011-03-12 DIAGNOSIS — F411 Generalized anxiety disorder: Secondary | ICD-10-CM | POA: Insufficient documentation

## 2011-03-12 DIAGNOSIS — F1410 Cocaine abuse, uncomplicated: Secondary | ICD-10-CM | POA: Insufficient documentation

## 2011-03-12 DIAGNOSIS — E119 Type 2 diabetes mellitus without complications: Secondary | ICD-10-CM | POA: Insufficient documentation

## 2011-03-12 DIAGNOSIS — I10 Essential (primary) hypertension: Secondary | ICD-10-CM | POA: Insufficient documentation

## 2011-03-12 DIAGNOSIS — F1010 Alcohol abuse, uncomplicated: Secondary | ICD-10-CM | POA: Insufficient documentation

## 2011-03-12 DIAGNOSIS — F1310 Sedative, hypnotic or anxiolytic abuse, uncomplicated: Secondary | ICD-10-CM | POA: Insufficient documentation

## 2011-03-12 LAB — URINALYSIS, ROUTINE W REFLEX MICROSCOPIC
Bilirubin Urine: NEGATIVE
Glucose, UA: NEGATIVE mg/dL
Hgb urine dipstick: NEGATIVE
Ketones, ur: NEGATIVE mg/dL
Leukocytes, UA: NEGATIVE
Nitrite: NEGATIVE
Protein, ur: NEGATIVE mg/dL
Specific Gravity, Urine: 1.006 (ref 1.005–1.030)
Urobilinogen, UA: 1 mg/dL (ref 0.0–1.0)
pH: 5.5 (ref 5.0–8.0)

## 2011-03-12 LAB — DIFFERENTIAL
Basophils Absolute: 0 K/uL (ref 0.0–0.1)
Basophils Relative: 1 % (ref 0–1)
Eosinophils Absolute: 0.4 10*3/uL (ref 0.0–0.7)
Eosinophils Relative: 9 % — ABNORMAL HIGH (ref 0–5)
Lymphocytes Relative: 35 % (ref 12–46)
Lymphs Abs: 1.8 10*3/uL (ref 0.7–4.0)
Monocytes Absolute: 0.6 10*3/uL (ref 0.1–1.0)
Monocytes Relative: 12 % (ref 3–12)
Neutro Abs: 2.2 K/uL (ref 1.7–7.7)
Neutrophils Relative %: 44 % (ref 43–77)

## 2011-03-12 LAB — BASIC METABOLIC PANEL
CO2: 21 mEq/L (ref 19–32)
Calcium: 9.5 mg/dL (ref 8.4–10.5)
Creatinine, Ser: 0.66 mg/dL (ref 0.50–1.35)
GFR calc non Af Amer: >90 mL/min (ref >90)
Glucose, Bld: 111 mg/dL — ABNORMAL HIGH (ref 70–99)

## 2011-03-12 LAB — CBC
HCT: 37.2 % — ABNORMAL LOW (ref 39.0–52.0)
Hemoglobin: 13.3 g/dL (ref 13.0–17.0)
MCH: 35 pg — ABNORMAL HIGH (ref 26.0–34.0)
MCHC: 35.8 g/dL (ref 30.0–36.0)
MCV: 97.9 fL (ref 78.0–100.0)
Platelets: 67 10*3/uL — ABNORMAL LOW (ref 150–400)
RBC: 3.8 MIL/uL — ABNORMAL LOW (ref 4.22–5.81)
RDW: 14.5 % (ref 11.5–15.5)
WBC: 5.1 10*3/uL (ref 4.0–10.5)

## 2011-03-12 LAB — RAPID URINE DRUG SCREEN, HOSP PERFORMED
Amphetamines: NOT DETECTED
Benzodiazepines: POSITIVE — AB
Opiates: NOT DETECTED
Tetrahydrocannabinol: NOT DETECTED

## 2011-03-12 LAB — BASIC METABOLIC PANEL WITH GFR
BUN: 12 mg/dL (ref 6–23)
Chloride: 101 meq/L (ref 96–112)
GFR calc Af Amer: >90 mL/min (ref >90)
Potassium: 4.3 meq/L (ref 3.5–5.1)
Sodium: 134 meq/L — ABNORMAL LOW (ref 135–145)

## 2011-03-12 LAB — ETHANOL: Alcohol, Ethyl (B): 121 mg/dL — ABNORMAL HIGH (ref 0–11)

## 2011-04-07 ENCOUNTER — Encounter (HOSPITAL_COMMUNITY): Payer: Self-pay | Admitting: *Deleted

## 2011-04-07 ENCOUNTER — Inpatient Hospital Stay (HOSPITAL_COMMUNITY)
Admission: AD | Admit: 2011-04-07 | Discharge: 2011-04-12 | DRG: 897 | Disposition: A | Discharge status: Home / Self Care | Payer: Medicare PPO | Source: Ambulatory Visit | Attending: Psychiatry | Admitting: Psychiatry

## 2011-04-07 ENCOUNTER — Emergency Department (HOSPITAL_COMMUNITY)
Admission: EM | Admit: 2011-04-07 | Discharge: 2011-04-07 | Disposition: A | Discharge status: Other Acute Inpatient Hospital | Payer: Medicare PPO | Source: Home / Self Care | Attending: Emergency Medicine | Admitting: Emergency Medicine

## 2011-04-07 ENCOUNTER — Encounter: Payer: Self-pay | Admitting: Emergency Medicine

## 2011-04-07 DIAGNOSIS — Z794 Long term (current) use of insulin: Secondary | ICD-10-CM | POA: Insufficient documentation

## 2011-04-07 DIAGNOSIS — E118 Type 2 diabetes mellitus with unspecified complications: Secondary | ICD-10-CM | POA: Diagnosis present

## 2011-04-07 DIAGNOSIS — F3160 Bipolar disorder, current episode mixed, unspecified: Secondary | ICD-10-CM

## 2011-04-07 DIAGNOSIS — Z79899 Other long term (current) drug therapy: Secondary | ICD-10-CM

## 2011-04-07 DIAGNOSIS — Z8673 Personal history of transient ischemic attack (TIA), and cerebral infarction without residual deficits: Secondary | ICD-10-CM

## 2011-04-07 DIAGNOSIS — F1010 Alcohol abuse, uncomplicated: Secondary | ICD-10-CM | POA: Diagnosis present

## 2011-04-07 DIAGNOSIS — F17200 Nicotine dependence, unspecified, uncomplicated: Secondary | ICD-10-CM | POA: Insufficient documentation

## 2011-04-07 DIAGNOSIS — X58XXXA Exposure to other specified factors, initial encounter: Secondary | ICD-10-CM

## 2011-04-07 DIAGNOSIS — F1020 Alcohol dependence, uncomplicated: Principal | ICD-10-CM

## 2011-04-07 DIAGNOSIS — F1994 Other psychoactive substance use, unspecified with psychoactive substance-induced mood disorder: Secondary | ICD-10-CM

## 2011-04-07 DIAGNOSIS — F10929 Alcohol use, unspecified with intoxication, unspecified: Secondary | ICD-10-CM

## 2011-04-07 DIAGNOSIS — S025XXA Fracture of tooth (traumatic), initial encounter for closed fracture: Secondary | ICD-10-CM | POA: Diagnosis not present

## 2011-04-07 DIAGNOSIS — M129 Arthropathy, unspecified: Secondary | ICD-10-CM

## 2011-04-07 DIAGNOSIS — I10 Essential (primary) hypertension: Secondary | ICD-10-CM

## 2011-04-07 DIAGNOSIS — G609 Hereditary and idiopathic neuropathy, unspecified: Secondary | ICD-10-CM

## 2011-04-07 DIAGNOSIS — E119 Type 2 diabetes mellitus without complications: Secondary | ICD-10-CM | POA: Insufficient documentation

## 2011-04-07 HISTORY — DX: Polyneuropathy, unspecified: G62.9

## 2011-04-07 HISTORY — DX: Essential (primary) hypertension: I10

## 2011-04-07 HISTORY — DX: Unspecified osteoarthritis, unspecified site: M19.90

## 2011-04-07 HISTORY — DX: Cerebral infarction, unspecified: I63.9

## 2011-04-07 HISTORY — DX: Bipolar disorder, current episode depressed, mild or moderate severity, unspecified: F31.30

## 2011-04-07 LAB — CBC
HCT: 35.2 % — ABNORMAL LOW (ref 39.0–52.0)
MCH: 34.3 pg — ABNORMAL HIGH (ref 26.0–34.0)
MCHC: 35.2 g/dL (ref 30.0–36.0)
MCV: 97.2 fL (ref 78.0–100.0)
RDW: 13.7 % (ref 11.5–15.5)

## 2011-04-07 LAB — COMPREHENSIVE METABOLIC PANEL
Albumin: 3 g/dL — ABNORMAL LOW (ref 3.5–5.2)
BUN: 8 mg/dL (ref 6–23)
Calcium: 8.8 mg/dL (ref 8.4–10.5)
Creatinine, Ser: 0.64 mg/dL (ref 0.50–1.35)
Total Bilirubin: 0.8 mg/dL (ref 0.3–1.2)
Total Protein: 6.4 g/dL (ref 6.0–8.3)

## 2011-04-07 LAB — ETHANOL: Alcohol, Ethyl (B): 67 mg/dL — ABNORMAL HIGH (ref 0–11)

## 2011-04-07 LAB — DIFFERENTIAL
Basophils Relative: 1 % (ref 0–1)
Monocytes Absolute: 0.3 10*3/uL (ref 0.1–1.0)
Monocytes Relative: 9 % (ref 3–12)
Neutro Abs: 1.5 10*3/uL — ABNORMAL LOW (ref 1.7–7.7)

## 2011-04-07 LAB — RAPID URINE DRUG SCREEN, HOSP PERFORMED
Amphetamines: NOT DETECTED
Benzodiazepines: POSITIVE — AB
Cocaine: POSITIVE — AB
Opiates: NOT DETECTED

## 2011-04-07 LAB — GLUCOSE, CAPILLARY: Glucose-Capillary: 267 mg/dL — ABNORMAL HIGH (ref 70–99)

## 2011-04-07 MED ORDER — LORAZEPAM 1 MG PO TABS
1.0000 mg | ORAL_TABLET | Freq: Three times a day (TID) | ORAL | Status: DC | PRN
  Administered 2011-04-07: 1 mg via ORAL
  Filled 2011-04-07: qty 1

## 2011-04-07 MED ORDER — ACETAMINOPHEN 325 MG PO TABS: 650.0000 mg | ORAL_TABLET | ORAL | Status: DC | PRN

## 2011-04-07 MED ORDER — LORAZEPAM 1 MG PO TABS
1.0000 mg | ORAL_TABLET | Freq: Four times a day (QID) | ORAL | Status: DC | PRN
  Administered 2011-04-07: 1 mg via ORAL
  Filled 2011-04-07: qty 1

## 2011-04-07 MED ORDER — HYDROXYZINE HCL 25 MG PO TABS
25.0000 mg | ORAL_TABLET | Freq: Four times a day (QID) | ORAL | Status: AC | PRN
  Administered 2011-04-08 – 2011-04-10 (×3): 25 mg via ORAL

## 2011-04-07 MED ORDER — LOPERAMIDE HCL 2 MG PO CAPS: 2.0000 mg | ORAL_CAPSULE | ORAL | Status: AC | PRN

## 2011-04-07 MED ORDER — CHLORDIAZEPOXIDE HCL 25 MG PO CAPS
25.0000 mg | ORAL_CAPSULE | ORAL | Status: AC
  Administered 2011-04-10 – 2011-04-11 (×2): 25 mg via ORAL
  Filled 2011-04-07 (×2): qty 1

## 2011-04-07 MED ORDER — CHLORDIAZEPOXIDE HCL 25 MG PO CAPS
25.0000 mg | ORAL_CAPSULE | Freq: Four times a day (QID) | ORAL | Status: AC
  Administered 2011-04-07 – 2011-04-09 (×6): 25 mg via ORAL
  Filled 2011-04-07 (×7): qty 1

## 2011-04-07 MED ORDER — THIAMINE HCL 100 MG/ML IJ SOLN: 100.0000 mg | Freq: Once | INTRAMUSCULAR | Status: DC

## 2011-04-07 MED ORDER — CHLORDIAZEPOXIDE HCL 25 MG PO CAPS
25.0000 mg | ORAL_CAPSULE | Freq: Four times a day (QID) | ORAL | Status: AC | PRN
  Administered 2011-04-08: 25 mg via ORAL

## 2011-04-07 MED ORDER — MAGNESIUM HYDROXIDE 400 MG/5ML PO SUSP
30.0000 mL | Freq: Every day | ORAL | Status: DC | PRN
  Administered 2011-04-11: 30 mL via ORAL

## 2011-04-07 MED ORDER — ALUM & MAG HYDROXIDE-SIMETH 200-200-20 MG/5ML PO SUSP: 30.0000 mL | ORAL | Status: DC | PRN

## 2011-04-07 MED ORDER — NICOTINE 21 MG/24HR TD PT24
21.0000 mg | MEDICATED_PATCH | Freq: Every day | TRANSDERMAL | Status: DC
  Administered 2011-04-07: 21 mg via TRANSDERMAL
  Filled 2011-04-07: qty 1

## 2011-04-07 MED ORDER — ZOLPIDEM TARTRATE 5 MG PO TABS: 5.0000 mg | ORAL_TABLET | Freq: Every evening | ORAL | Status: DC | PRN

## 2011-04-07 MED ORDER — INSULIN GLARGINE 100 UNIT/ML ~~LOC~~ SOLN
30.0000 [IU] | Freq: Every day | SUBCUTANEOUS | Status: DC
  Administered 2011-04-07 – 2011-04-11 (×5): 30 [IU] via SUBCUTANEOUS

## 2011-04-07 MED ORDER — CHLORDIAZEPOXIDE HCL 25 MG PO CAPS
25.0000 mg | ORAL_CAPSULE | Freq: Three times a day (TID) | ORAL | Status: AC
  Administered 2011-04-09 – 2011-04-10 (×3): 25 mg via ORAL
  Filled 2011-04-07 (×3): qty 1

## 2011-04-07 MED ORDER — CHLORDIAZEPOXIDE HCL 25 MG PO CAPS
25.0000 mg | ORAL_CAPSULE | Freq: Every day | ORAL | Status: AC
  Administered 2011-04-12: 25 mg via ORAL
  Filled 2011-04-07: qty 1

## 2011-04-07 MED ORDER — VITAMIN B-1 100 MG PO TABS
100.0000 mg | ORAL_TABLET | Freq: Every day | ORAL | Status: DC
  Administered 2011-04-08 – 2011-04-12 (×5): 100 mg via ORAL
  Filled 2011-04-07 (×7): qty 1

## 2011-04-07 MED ORDER — INSULIN ASPART 100 UNIT/ML ~~LOC~~ SOLN
0.0000 [IU] | Freq: Three times a day (TID) | SUBCUTANEOUS | Status: DC
  Administered 2011-04-08: 2 [IU] via SUBCUTANEOUS
  Administered 2011-04-08: 3 [IU] via SUBCUTANEOUS
  Administered 2011-04-08: 8 [IU] via SUBCUTANEOUS
  Administered 2011-04-09: 3 [IU] via SUBCUTANEOUS
  Filled 2011-04-07: qty 3

## 2011-04-07 MED ORDER — ONDANSETRON 4 MG PO TBDP: 4.0000 mg | ORAL_TABLET | Freq: Four times a day (QID) | ORAL | Status: AC | PRN

## 2011-04-07 MED ORDER — ACETAMINOPHEN 325 MG PO TABS
650.0000 mg | ORAL_TABLET | Freq: Four times a day (QID) | ORAL | Status: DC | PRN
  Administered 2011-04-08 – 2011-04-12 (×9): 650 mg via ORAL

## 2011-04-07 MED ORDER — NICOTINE 21 MG/24HR TD PT24
21.0000 mg | MEDICATED_PATCH | Freq: Every day | TRANSDERMAL | Status: DC
  Administered 2011-04-08 – 2011-04-11 (×5): 21 mg via TRANSDERMAL
  Filled 2011-04-07 (×6): qty 1

## 2011-04-07 MED ORDER — IBUPROFEN 200 MG PO TABS
600.0000 mg | ORAL_TABLET | Freq: Three times a day (TID) | ORAL | Status: DC | PRN
  Administered 2011-04-07: 600 mg via ORAL
  Filled 2011-04-07: qty 1

## 2011-04-07 MED ORDER — DIPHENHYDRAMINE HCL 25 MG PO CAPS: 50.0000 mg | ORAL_CAPSULE | Freq: Every evening | ORAL | Status: DC | PRN

## 2011-04-07 MED ORDER — ZIPRASIDONE MESYLATE 20 MG IM SOLR: 10.0000 mg | Freq: Once | INTRAMUSCULAR | Status: DC

## 2011-04-07 MED ORDER — THERA M PLUS PO TABS
1.0000 | ORAL_TABLET | Freq: Every day | ORAL | Status: DC
  Administered 2011-04-08 – 2011-04-12 (×5): 1 via ORAL
  Filled 2011-04-07 (×6): qty 1

## 2011-04-07 MED ORDER — ONDANSETRON HCL 4 MG PO TABS
4.0000 mg | ORAL_TABLET | Freq: Three times a day (TID) | ORAL | Status: DC | PRN
  Administered 2011-04-07: 4 mg via ORAL
  Filled 2011-04-07: qty 1

## 2011-04-07 NOTE — BH Assessment (Signed)
Assessment Note   [name] is an 55 y.o. male. PATIENT PRESENTS TO ER WITH C/O SUBSTANCE ABUSE AND REPORTS THAT HE WANTS TO GET BACK ON HIS RIGHT MEDS TO TREAT HIS BIPOLAR D/O. PT REQUESTING VOLUNTARY INPATIENT DETOX ADMISSION. PT REPORTS DAILY ETOH USE REPORTING THAT HE DRINKS A 12 PK> OF BEER DAILY  AND REPORTS SNORTING COCAINE(POWDER) AT LEAST 2X PER WEEK. PT REPORTS FEELING DEPRESSED BECAUSE HE CANT WORK AND IS RECEIVING DISABILITY INCOME. PT REPORTS STRESSORS TO INCLUDE: MOTHER RECENTLY HAD A BODY PART AMPUTATED(NOS) AND HIS SON RECENTLY HAD TO HAVE HIS RIGHT ARM AMPUTATED.PT REPORTS THAT HE WANTS TO GET BETTER AND DO THE RIGHT THING AND WANTS TO SEE HIS GRANDCHILDREN BUT REPORTS THAT HE CANT RIGHT NOW DUE TO HIS SUBSTANCE ABUSE PROBLEM. PT DENIES HX OF SEIZURES OR DT'S. PT DENIES SI,HI. AND NO AVH REPORTED.CONSULTED WITH EDP DR. Clarene Duke WHO IS AGREEABLE WITH PT BEING REFERRED TO OTHER FACILITY FOR INPATIENT DETOX TREATMENT.  Axis I: Alcohol Dependence Axis II: Deferred Axis III:  Past Medical History  Diagnosis Date  . Neuropathy   . Diabetes mellitus   . Bipolar affect, depressed    Axis IV: economic problems, other psychosocial or environmental problems, problems related to social environment and problems with primary support group Axis V: 31-40 impairment in reality testing  Past Medical History:  Past Medical History  Diagnosis Date  . Neuropathy   . Diabetes mellitus   . Bipolar affect, depressed     History reviewed. No pertinent past surgical history.  Family History: No family history on file.  Social History:  reports that he has been smoking Cigarettes.  He has smoked for the past 1 year. He does not have any smokeless tobacco history on file. He reports that he drinks about 7.2 ounces of alcohol per week. He reports that he uses illicit drugs (Cocaine).  Allergies: No Known Allergies  Home Medications:  Medications Prior to Admission  Medication Dose Route  Frequency Provider Last Rate Last Dose  . acetaminophen (TYLENOL) tablet 650 mg  650 mg Oral Q4H PRN Demetrius Charity, PA      . alum & mag hydroxide-simeth (MAALOX/MYLANTA) 200-200-20 MG/5ML suspension 30 mL  30 mL Oral PRN Jennifer A Pitylak, PA      . ibuprofen (ADVIL,MOTRIN) tablet 600 mg  600 mg Oral Q8H PRN Demetrius Charity, PA      . LORazepam (ATIVAN) tablet 1 mg  1 mg Oral Q8H PRN Jennifer A Pitylak, PA      . nicotine (NICODERM CQ - dosed in mg/24 hours) patch 21 mg  21 mg Transdermal Daily Jennifer A Pitylak, PA   21 mg at 04/07/11 1022  . ondansetron (ZOFRAN) tablet 4 mg  4 mg Oral Q8H PRN Jennifer A Pitylak, PA      . ziprasidone (GEODON) injection 10 mg  10 mg Intramuscular Once [employer], PA      . zolpidem (AMBIEN) tablet 5 mg  5 mg Oral QHS PRN Demetrius Charity, PA      . DISCONTD: LORazepam (ATIVAN) tablet 1 mg  1 mg Oral Q6H PRN Demetrius Charity, PA   1 mg at 04/07/11 0645   No current outpatient prescriptions on file as of 04/07/2011.    OB/GYN Status:  No LMP for male patient.  General Assessment Data Assessment Number: 1  Living Arrangements: Spouse/significant other Can pt return to current living arrangement?: Yes Admission Status: Voluntary Is patient capable of signing  voluntary admission?: Yes Transfer from: Acute Hospital Referral Source: Self/Family/Friend  Risk to self Suicidal Ideation: No-Not Currently/Within Last 6 Months Suicidal Intent: No-Not Currently/Within Last 6 Months Is patient at risk for suicide?: No Suicidal Plan?: No Access to Means: No What has been your use of drugs/alcohol within the last 12 months?:  (Etoh-beer, Cocaine-Powder) Triggers for Past Attempts: None known Intentional Self Injurious Behavior: None Factors that decrease suicide risk: Sense of responsibility to family Family Suicide History: No Recent stressful life event(s): Financial Problems Persecutory voices/beliefs?: No (mother and son had recent  amputation-son-right arm) Depression: Yes Depression Symptoms: Insomnia;Loss of interest in usual pleasures;Feeling worthless/self pity Substance abuse history and/or treatment for substance abuse?: Yes ([facility], [facility]) Suicide prevention information given to non-admitted patients: Not applicable  Risk to Others Homicidal Ideation: No-Not Currently/Within Last 6 Months Thoughts of Harm to Others: No-Not Currently Present/Within Last 6 Months Current Homicidal Intent: No Current Homicidal Plan: No Access to Homicidal Means: No History of harm to others?: No Assessment of Violence: None Noted Does patient have access to weapons?: No Criminal Charges Pending?: No Does patient have a court date: No  Mental Status Report Appear/Hygiene: Other (Comment) (Appropriate) Eye Contact: Fair Motor Activity: Other (Comment) (Appropriate) Speech: Logical/coherent Level of Consciousness: Alert Mood: Other (Comment) Affect:  (Appropriate) Anxiety Level: Minimal Thought Processes: Coherent;Relevant Judgement: Impaired Orientation: Person;Place;Time;Situation Obsessive Compulsive Thoughts/Behaviors: None  Cognitive Functioning Concentration: Normal Memory: Recent Intact IQ: Average Insight: Fair Impulse Control: Poor Appetite: Good Sleep: Decreased (varies) Total Hours of Sleep:  (varies) Vegetative Symptoms: None  Prior Inpatient/Outpatient Therapy Prior Therapy: Inpatient Prior Therapy Dates:  (2010 and 7 yrs ago (detox,[facility] tx)) Prior Therapy Facilty/Provider(s):  (no outpatient providers reported)  ADL Screening (condition at time of admission) Patient's cognitive ability adequate to safely complete daily activities?: Yes Patient able to express need for assistance with ADLs?: Yes Independently performs ADLs?: Yes Weakness of Legs:  (Pt reports swelling in both feet) Weakness of Arms/Hands: None  Home Assistive Devices/Equipment Home Assistive  Devices/Equipment: None    Abuse/Neglect Assessment (Assessment to be complete while patient is alone) Physical Abuse: Denies Verbal Abuse: Denies Sexual Abuse: Denies Exploitation of patient/patient's resources: Denies Self-Neglect: Denies Values / Beliefs Cultural Requests During Hospitalization: None Spiritual Requests During Hospitalization: None   Advance Directives (For Healthcare) Advance Directive: Patient does not have advance directive Nutrition Screen Diet:  (Pt reports he is a diabetic and has limitations on  sugar ) Unintentional weight loss greater than 10lbs within the last month: No Dysphagia: No Home Tube Feeding or Total Parenteral Nutrition (TPN): No Patient appears severely malnourished: No Pregnant or Lactating: No  Additional Information 1:1 In Past 12 Months?: No CIRT Risk: No Elopement Risk: No Does patient have medical clearance?: Yes     Disposition:  Disposition Disposition of Patient: Referred to Patient referred to: Other (Comment) [facility] FOR DETOX TREATMENT)  On Site Evaluation by:   Reviewed with Physician:     Bjorn Pippin 04/07/2011 1:11 PM

## 2011-04-07 NOTE — ED Notes (Signed)
WUJ:[id number]<YN> Expected date:04/07/11<BR> Expected time: 4:22 AM<BR> Means of arrival:Ambulance<BR> Comments:<BR> Cocaine abuse

## 2011-04-07 NOTE — ED Provider Notes (Signed)
Medical screening examination/treatment/procedure(s) were performed by non-physician practitioner and as supervising physician I was immediately available for consultation/collaboration.  Olivia Mackie, MD 04/07/11 [phone]

## 2011-04-07 NOTE — ED Notes (Signed)
Discharge orders received and security called for transport to [facility]. Pt in stable condition.

## 2011-04-07 NOTE — ED Notes (Signed)
Pt belongings under nursing station

## 2011-04-07 NOTE — ED Notes (Signed)
Pt was placed into scrubs. Pt searched by security. Pt has one belonging bag and a purple bag at nursing station.

## 2011-04-07 NOTE — ED Notes (Signed)
Breakfast tray delivered

## 2011-04-07 NOTE — ED Notes (Signed)
Pt presents via EMS, +ETOH, +cocaine, presents with c/o "out of mental health medications", pt hx bipolar disorder, pt ambulates to triage, no s/s distress or discomfort noted

## 2011-04-07 NOTE — ED Notes (Signed)
On assessment, pt could not really tell me why he presented to the ER. Pt said he had been drinking and that his body ached. Said, " I guess I need help with alcohol". Pt resting. In blue scrubs. Has been wanded by security. Needs assessed

## 2011-04-07 NOTE — ED Notes (Signed)
Gave report to Psych ED voiced understanding of report

## 2011-04-07 NOTE — ED Notes (Signed)
MD notified that pt is ready for transport to [facility], and that we need D/C orders put in Epic. Transport pending on orders.

## 2011-04-07 NOTE — ED Provider Notes (Signed)
History  CSN: [id number] Arrival date & time: 04/07/2011  4:50 AM   First MD Initiated Contact with Patient 04/07/11 [phone]      Chief Complaint  Patient presents with  . Psychiatric Evaluation   Patient is a 55 y.o. male presenting with drug/alcohol assessment.  Drug / Alcohol Assessment Primary symptoms include intoxication.  Primary symptoms include no confusion, no somnolence, no loss of consciousness, no seizures, no weakness, no agitation, no delusions, no hallucinations, no self-injury and no violence. This is a chronic problem. Suspected agents include alcohol and cocaine. Pertinent negatives include no fever, no nausea, no vomiting, no bladder incontinence and no bowel incontinence.   Patient seen and evaluated at 06:04 am. Patient presents to the emergency room with complaint of needing detox from alcohol and cocaine. Reports that he has been drinking today and been using cocaine. Ambulated at triage. No complaints. Denies that he has ever had any tremors or seizures.    Past Medical History  Diagnosis Date  . Neuropathy   . Diabetes mellitus   . Bipolar affect, depressed     History reviewed. No pertinent past surgical history.  No family history on file.  History  Substance Use Topics  . Smoking status: Current Everyday Smoker  . Smokeless tobacco: Not on file  . Alcohol Use: Yes     Review of Systems  Constitutional: Negative for fever, chills, diaphoresis and appetite change.  HENT: Negative for neck pain.   Eyes: Negative for photophobia and visual disturbance.  Respiratory: Negative for cough, chest tightness and shortness of breath.   Cardiovascular: Negative for chest pain.  Gastrointestinal: Negative for nausea, vomiting, abdominal pain and bowel incontinence.  Genitourinary: Negative for bladder incontinence and flank pain.  Musculoskeletal: Negative for back pain.  Skin: Negative for rash.  Neurological: Negative for seizures, loss of consciousness,  weakness and numbness.  Psychiatric/Behavioral: Negative for suicidal ideas, hallucinations, behavioral problems, confusion, sleep disturbance, self-injury, dysphoric mood and agitation.  All other systems reviewed and are negative.    Allergies  Review of patient's allergies indicates no known allergies.  Home Medications   Current Outpatient Rx  Name Route Sig Dispense Refill  . ALPRAZOLAM 1 MG PO TABS Oral Take 1 mg by mouth 3 (three) times daily as needed.      Marland Kitchen AMITRIPTYLINE HCL 50 MG PO TABS Oral Take 50 mg by mouth at bedtime as needed.      Marland Kitchen HYDROCODONE-ACETAMINOPHEN 10-325 MG PO TABS Oral Take 1 tablet by mouth every 6 (six) hours as needed.      . INSULIN ASPART 100 UNIT/ML [place] SOLN Subcutaneous Inject into the skin 3 (three) times daily before meals.      Marland Kitchen LISINOPRIL 10 MG PO TABS Oral Take 10 mg by mouth daily.        BP 119/59  Pulse 70  Temp(Src) 98 F (36.7 C) (Oral)  Resp 16  Wt 230 lb (104.327 kg)  SpO2 97%  Physical Exam  Nursing note and vitals reviewed. Constitutional: He is oriented to person, place, and time. He appears well-developed and well-nourished.  HENT:  Head: Normocephalic and atraumatic.  Eyes: EOM are normal. Pupils are equal, round, and reactive to light. Right eye exhibits no discharge. Left eye exhibits no discharge. No scleral icterus.  Neck: Normal range of motion. Neck supple.  Cardiovascular: Normal rate, regular rhythm and normal heart sounds.  Exam reveals no gallop and no friction rub.   No murmur heard. Pulmonary/Chest: Effort normal and  breath sounds normal. No stridor.  Abdominal: Soft. Bowel sounds are normal. He exhibits no distension and no mass. There is no tenderness. There is no rebound and no guarding.  Musculoskeletal: Normal range of motion. He exhibits no edema and no tenderness.  Neurological: He is alert and oriented to person, place, and time.  Skin: Skin is warm and dry. No rash noted. No erythema. No pallor.    Psychiatric: He has a normal mood and affect. His behavior is normal. Judgment and thought content normal.    ED Course  Procedures (including critical care time)  Patient seen and evaluated.  VSS reviewed. . Nursing notes reviewed. Discussed with attending physician, d/w dr. Norlene Campbell. Initial testing ordered. Will monitor the patient closely. They agree with the treatment plan and diagnosis.   Patient seen and re-evaluated. Resting comfortably. VSS stable. NAD. Patient notified of testing results. Stated agreement and understanding. Patient stated understanding to treatment plan and diagnosis.   7:47 AM Pending labs. Will call ACT team to evaluate the patient once labs are back.  Results for orders placed during the hospital encounter of 04/07/11  CBC      Component Value Range   WBC 3.6 (*) 4.0 - 10.5 (K/uL)   RBC 3.62 (*) 4.22 - 5.81 (MIL/uL)   Hemoglobin 12.4 (*) 13.0 - 17.0 (g/dL)   HCT 21.3 (*) 08.6 - 52.0 (%)   MCV 97.2  78.0 - 100.0 (fL)   MCH 34.3 (*) 26.0 - 34.0 (pg)   MCHC 35.2  30.0 - 36.0 (g/dL)   RDW 57.8  46.9 - 62.9 (%)   Platelets 54 (*) 150 - 400 (K/uL)  COMPREHENSIVE METABOLIC PANEL      Component Value Range   Sodium 136  135 - 145 (mEq/L)   Potassium 3.8  3.5 - 5.1 (mEq/L)   Chloride 104  96 - 112 (mEq/L)   CO2 25  19 - 32 (mEq/L)   Glucose, Bld 85  70 - 99 (mg/dL)   BUN 8  6 - 23 (mg/dL)   Creatinine, Ser 5.28  0.50 - 1.35 (mg/dL)   Calcium 8.8  8.4 - 41.3 (mg/dL)   Total Protein 6.4  6.0 - 8.3 (g/dL)   Albumin 3.0 (*) 3.5 - 5.2 (g/dL)   AST 53 (*) 0 - 37 (U/L)   ALT 28  0 - 53 (U/L)   Alkaline Phosphatase 83  39 - 117 (U/L)   Total Bilirubin 0.8  0.3 - 1.2 (mg/dL)   GFR calc non Af Amer >90  >90 (mL/min)   GFR calc Af Amer >90  >90 (mL/min)  ETHANOL      Component Value Range   Alcohol, Ethyl (B) 67 (*) 0 - 11 (mg/dL)  URINE RAPID DRUG SCREEN (HOSP PERFORMED)      Component Value Range   Opiates NONE DETECTED  NONE DETECTED    Cocaine PENDING   NONE DETECTED    Benzodiazepines PENDING  NONE DETECTED    Amphetamines NONE DETECTED  NONE DETECTED    Tetrahydrocannabinol NONE DETECTED  NONE DETECTED    Barbiturates NONE DETECTED  NONE DETECTED   DIFFERENTIAL      Component Value Range   Neutrophils Relative 44  43 - 77 (%)   Neutro Abs 1.5 (*) 1.7 - 7.7 (K/uL)   Lymphocytes Relative 36  12 - 46 (%)   Lymphs Abs 1.2  0.7 - 4.0 (K/uL)   Monocytes Relative 9  3 - 12 (%)   Monocytes Absolute  0.3  0.1 - 1.0 (K/uL)   Eosinophils Relative 11 (*) 0 - 5 (%)   Eosinophils Absolute 0.4  0.0 - 0.7 (K/uL)   Basophils Relative 1  0 - 1 (%)   Basophils Absolute 0.0  0.0 - 0.1 (K/uL)    Component     Latest Ref Rng 01/24/2011 02/16/2011 03/12/2011 04/07/2011  Platelets     150 - 400 K/uL 56 (L) 63 (L) 67 (L) 54 (L)   8:23 AM chronic thrombocytopenia d/w dr. Clarene Duke and dr. Fredricka Bonine. ACT team called. Will come evaluate the patient.   MDM  Alcohol/Drug Abuse Medical clearance     5:15 PM Alcohol and cocaine abuse, has been accepted at [facility] awaiting transfer.  Nicholes Stairs, MD 04/07/11 1715

## 2011-04-07 NOTE — ED Notes (Signed)
Lunch tray delivered.

## 2011-04-08 DIAGNOSIS — F1010 Alcohol abuse, uncomplicated: Secondary | ICD-10-CM | POA: Diagnosis present

## 2011-04-08 DIAGNOSIS — F10229 Alcohol dependence with intoxication, unspecified: Secondary | ICD-10-CM

## 2011-04-08 DIAGNOSIS — E118 Type 2 diabetes mellitus with unspecified complications: Secondary | ICD-10-CM | POA: Diagnosis present

## 2011-04-08 LAB — GLUCOSE, CAPILLARY
Glucose-Capillary: 200 mg/dL — ABNORMAL HIGH (ref 70–99)
Glucose-Capillary: 201 mg/dL — ABNORMAL HIGH (ref 70–99)

## 2011-04-08 MED ORDER — ASPIRIN EC 81 MG PO TBEC
81.0000 mg | DELAYED_RELEASE_TABLET | Freq: Every day | ORAL | Status: DC
  Administered 2011-04-08 – 2011-04-12 (×5): 81 mg via ORAL
  Filled 2011-04-08 (×7): qty 1

## 2011-04-08 MED ORDER — GABAPENTIN 300 MG PO CAPS
300.0000 mg | ORAL_CAPSULE | ORAL | Status: DC
  Administered 2011-04-08 – 2011-04-09 (×2): 300 mg via ORAL
  Filled 2011-04-08 (×5): qty 1

## 2011-04-08 MED ORDER — TRAZODONE HCL 100 MG PO TABS
100.0000 mg | ORAL_TABLET | Freq: Every day | ORAL | Status: DC
  Administered 2011-04-08 – 2011-04-11 (×4): 100 mg via ORAL
  Filled 2011-04-08 (×6): qty 1

## 2011-04-08 MED ORDER — GABAPENTIN 100 MG PO CAPS
200.0000 mg | ORAL_CAPSULE | Freq: Three times a day (TID) | ORAL | Status: DC
  Administered 2011-04-08 (×2): 200 mg via ORAL
  Filled 2011-04-08 (×6): qty 2

## 2011-04-08 MED ORDER — CHLORDIAZEPOXIDE HCL 25 MG PO CAPS
ORAL_CAPSULE | ORAL | Status: AC
  Filled 2011-04-08: qty 1

## 2011-04-08 MED ORDER — LISINOPRIL 10 MG PO TABS
10.0000 mg | ORAL_TABLET | Freq: Every day | ORAL | Status: DC
  Administered 2011-04-08 – 2011-04-12 (×5): 10 mg via ORAL
  Filled 2011-04-08 (×7): qty 1

## 2011-04-08 MED ORDER — METFORMIN HCL 500 MG PO TABS
500.0000 mg | ORAL_TABLET | Freq: Two times a day (BID) | ORAL | Status: DC
  Administered 2011-04-08 – 2011-04-12 (×9): 500 mg via ORAL
  Filled 2011-04-08 (×11): qty 1

## 2011-04-08 NOTE — Progress Notes (Signed)
Pt is resting in bed with eyes closed. RR WNL, even and unlabored. Light snoring audible. No distress noted, no complaints observed. Pt remains on Level III obs for safety and remains safe. Edsel Petrin RN

## 2011-04-08 NOTE — Progress Notes (Signed)
  Psychiatric Admit Note Dictated [phone]  No suicidal thoughts.  Requests detox from alcohol, and started on Librium Protocol.  In full contact with reality.   Home meds restarted.  Glucose Control Protocol in place.  He agrees with the plan.

## 2011-04-08 NOTE — BH Assessment (Addendum)
ADMISSION NOTE: This is a 55 yr old Caucasian male admitted for Alcohol dependence. Pt reported that he drinks 6-12 cans of beer daily and also uses Cocaine twice a week. He endorsed stressors related to chronic pain and his inability to work. He reported that he fell off a tower about 10 yrs ago and had multiple fractures. Pt stated that he has a pin in his rt leg and a plate on rt hand. He has medical history of diabetes and hypertension. Patient brought in a pack of Lantus to the unit. RN secured this medication in a brown bag inside the 300 unit refrigerator to be returned to patient at discharge. Patient CBG on admission was 267 on admission. The direction on patient home med (Lantus) was 30 unit at HS. Roxy Cedar, NP on call notified, she gave a telephone order to give 30 unit QHS.  Patient received his bedtime Lantus, and Librium, Refused Thiamine injection and the FLU/ Pneumonia vacine. He denied si/hi and denied hallucinations. Patient oriented to the unit. Q min checks intiated.

## 2011-04-08 NOTE — Assessment & Plan Note (Signed)
NAMECRISTINA, MATTERN NO.:  [id number]  MEDICAL RECORD NO.:  [id number]  LOCATION:  0306                          FACILITY:  BH  PHYSICIAN:  Eulogio Ditch, MD DATE OF BIRTH:  1956-04-03  DATE OF ADMISSION:  04/07/2011 DATE OF DISCHARGE:                      PSYCHIATRIC ADMISSION ASSESSMENT   DATE OF THE ASSESSMENT:  March 29, 2011 at 8:30 am.  IDENTIFYING INFORMATION:  This is a 55 year old, single, Caucasian male. This is a voluntary admission.  HISTORY OF PRESENT ILLNESS:  This is one  of several [facility] admissions for Hiroshi who was recently on our unit about three  months ago.  He presents after a one-week binge on alcohol, and snorted cocaine once or twice during that binge.  He said his relapse was triggered by his brother coming into town who has always been a bad influence on him.  Brother is now in jail after the relapse,  had stolen Rafel's truck.  Prior to that, he was clean and sober for 3 months.  His girlfriend has put him out of the house.  Wants to get clean and sober.  He denies any suicidal thoughts.  PAST PSYCHIATRIC HISTORY:  An aunt has a history of polysubstance abuse, primarily with alcohol also with cocaine.  Longest period  sober is several months in a row.  He has admission for numerous detox programs including [facility], and has been to an [facility].  He is not receiving any current outpatient treatment.  SOCIAL HISTORY:  Completed the 11th grade.  Married and divorced once. Living with his current girlfriend for the past 21 years.  They have a good relationship, but she does not tolerate his substance abuse, and from time to time  puts him out of the house when he relapses.  He has a son and a daughter.  He has been disabled since age 24 from diabetes due to history of a detached retina and cataracts.  He previously worked in the [employer].  FAMILY HISTORY:  Brother with polysubstance abuse.  MEDICAL PROBLEMS:   Current medical problems are diabetes mellitus type 2 with complications, peripheral neuropathy.  Questionable history of previous CVA 5 years ago, and arthritis NOS.  Past medical history shows previous fracture of his leg and arm about 10 years ago, and recent cataract surgery.  CURRENT MEDICATIONS:  Medications at home include  aspirin 81 mg daily, gabapentin 200 mg t.i.d., lisinopril 10 mg daily, and metformin 500 mg b.i.d. with meal.  He also takes Lantus insulin 30 units subcu at bedtime.  DRUG ALLERGIES:  None.  PHYSICAL EXAMINATION:  Full physical exam was done in the emergency room and he has been examined here on the unit, and we find no variances from the exam and review of systems done in the emergency room.  He is fully alert today,  in full contact with reality.  Weighs 109 kg, about 5 feet 11 inches tall.  Adequately nourished, normally developed with a trace edema in his bilateral lower extremities, complaining of some tingling and symptoms of his peripheral neuropathy.  Diagnostic studies were done in the emergency room.  CBG this morning was 127.  Chemistry sodium 136,  potassium 3.8, chloride 104, carbon dioxide 25, BUN 8, creatinine 0.64.  Liver enzymes SGOT 53 SGPT 28, alkaline phosphatase 83, and total bilirubin 0.8.  CBC WBC 3.6, hemoglobin 12.4, hematocrit 35.2, and platelets 54,000.  Alcohol level on presentation was 67.  Urine drug screen positive for benzodiazepines and cocaine.  MENTAL STATUS EXAM:  Fully alert male,  pleasant cooperative,  says he was also taking some Xanax prior to admission  that was prescribed for him, but the dose is unclear.  Asks for help getting detoxed from the alcohol, getting sober again,  denies any suicidal thoughts.  Thinking is logical,  nonpsychotic , no evidence of delirium or confusion.  ADMISSION DIAGNOSES: Axis I:  Alcohol abuse and dependence,  polysubstance abuse. Axis II:  No diagnosis. Axis III:  Diabetes  mellitus type 2 with complications.  Peripheral neuropathy, pancytopenia, thrombocytopenia. Axis IV:  Domestic issues related to substance abuse. Axis V:  Current 40,  past year not known.  PLAN:  Voluntarily admit him with a goal of a safe detox in four days. We  started him on a Librium detox protocol with a goal of a safe detox. He is going to talk with our case manager about follow up plans.     Earle Burson A. Lorin Picket, N.P.   ______________________________ Eulogio Ditch, MD    MAS/MEDQ  D:  04/08/2011  T:  04/08/2011  Job:  [id number]

## 2011-04-08 NOTE — Progress Notes (Signed)
Suicide Risk Assessment  Admission Assessment     Demographic factors:  Assessment Details Time of Assessment: Admission Information Obtained From: Patient Current Mental Status:  Current Mental Status:  (Denied) Loss Factors:  Loss Factors:  (Denied) Historical Factors:  Historical Factors:  (Denied) Risk Reduction Factors:  Risk Reduction Factors: Religious beliefs about death  CLINICAL FACTORS:   Severe Anxiety and/or Agitation Depression:   Anhedonia Insomnia Alcohol/Substance Abuse/Dependencies More than one psychiatric diagnosis Previous Psychiatric Diagnoses and Treatments Medical Diagnoses and Treatments/Surgeries  Diagnosis:  Axis I: Alcohol Dependence  Substance Induced Mood Disorder   The patient was seen today and reports the following:   ADL's: Intact  Sleep: The patient reports difficulty initiating and maintaining sleep.  Appetite: Good Appetite.   Mild>(1-10) >Severe  Hopelessness (1-10): 3  Depression (1-10): 5  Anxiety (1-10): 8   Suicidal Ideation: The patient adamantly denies any suicidal ideations today.  Plan: No  Intent: No  Means: No Homicidal Ideation: The patient denies any homicidal ideations.  Plan: No  Intent: No.  Means: No   Motor Behavior: Normal  Speech - Normal. Anxiety Level: Severe.  Mood: Moderately Depressed.  Affect: Moderately Constricted.  Eye Contact: Good  General Appearance/Behavior: Neat and Casual  Mental Status: AO x 3  Thought Process: Coherent  Thought Content: WNL. No auditory or visual hallucinations or delusional thinking.  Perception: Normal  Judgment: Fair to Good.  Insight: Fair to Good  Cognition: Orientation time, place and person.   Treatment Plan Summary:  1. Daily contact with patient to assess and evaluate symptoms and progress in treatment  2. Medication management  3. The patient will deny suicidal ideations or homicidal ideations for 48 hours prior to discharge and have a depression and  anxiety rating of 3 or less. The patient will also deny any auditory or visual hallucinations or delusional thinking.   Plan:  1. Will start the medication Neurontin at 300 mgs po q 8 am, 2 pm and 10 pm for anxiety and pain.  2. Will start Trazodone 100 mgs po qhs for sleep.  3. Will continue the Librium detox protocol. 4. Continue to monitor.   SUICIDE RISK:   Minimal: No identifiable suicidal ideation.  Patients presenting with no risk factors but with morbid ruminations; may be classified as minimal risk based on the severity of the depressive symptoms   [name] 04/08/2011, 6:05 PM

## 2011-04-08 NOTE — Progress Notes (Signed)
[facility] Group Notes:  (Counselor/Nursing/MHT/Case Management/Adjunct)  04/08/2011 4:49 PM  Type of Therapy:  group therapy  Participation Level:  Active  Participation Quality:  Appropriate  Affect:  Appropriate  Cognitive:  Appropriate  Insight:  Good  Engagement in Group:  Good  Engagement in Therapy:  Good  Modes of Intervention:  Problem-solving, Support and exploration  Summary of Progress/Problems:  Pt was able to state that he feels he will relapse if he has extra money in his pocket, pt feels he can not handle currently having money. Pt states his common law wife is an Scientist, forensic and so is his mother- pt states he feels like he is leading separate lives.Pt states he drinks and drugs due to loss of job and boredom.   Purcell Nails 04/08/2011, 4:49 PM

## 2011-04-08 NOTE — Progress Notes (Signed)
Pt reluctantly agreed to attend AM group.  C/O neuropathy in feet.  Left here in August.  Relapsed several weeks later when his brother came back to the area.  Requesting referral to [facility].  I am unsure of insurance status at this point so did not make a referral.

## 2011-04-08 NOTE — Progress Notes (Signed)
  Patient has a flat affect on approach.  He states that he has been depressed and is detoxing from ETOH. Currently on Librium protocol. States that he has been having some stomach issues and some tremors. No immediate distress. Had to wake up to give 0800 meds. Currently denies any SI at this time. Staff will continue to monitor and encourage group attendance.

## 2011-04-09 DIAGNOSIS — F1020 Alcohol dependence, uncomplicated: Principal | ICD-10-CM

## 2011-04-09 LAB — GLUCOSE, CAPILLARY
Glucose-Capillary: 404 mg/dL — ABNORMAL HIGH (ref 70–99)
Glucose-Capillary: 324 mg/dL — ABNORMAL HIGH (ref 70–99)

## 2011-04-09 MED ORDER — FUROSEMIDE 40 MG PO TABS
40.0000 mg | ORAL_TABLET | Freq: Every day | ORAL | Status: DC
  Administered 2011-04-09 – 2011-04-12 (×4): 40 mg via ORAL
  Filled 2011-04-09 (×4): qty 1

## 2011-04-09 MED ORDER — INSULIN ASPART 100 UNIT/ML ~~LOC~~ SOLN
0.0000 [IU] | Freq: Three times a day (TID) | SUBCUTANEOUS | Status: DC
  Administered 2011-04-09: 15 [IU] via SUBCUTANEOUS
  Administered 2011-04-09: 5 [IU] via SUBCUTANEOUS
  Administered 2011-04-10: 8 [IU] via SUBCUTANEOUS
  Administered 2011-04-10: 5 [IU] via SUBCUTANEOUS
  Administered 2011-04-10: 8 [IU] via SUBCUTANEOUS
  Administered 2011-04-11: 15 [IU] via SUBCUTANEOUS
  Administered 2011-04-11: 3 [IU] via SUBCUTANEOUS
  Administered 2011-04-11: 8 [IU] via SUBCUTANEOUS
  Administered 2011-04-12: 5 [IU] via SUBCUTANEOUS
  Administered 2011-04-12: 2 [IU] via SUBCUTANEOUS
  Filled 2011-04-09: qty 3

## 2011-04-09 MED ORDER — GABAPENTIN 400 MG PO CAPS
400.0000 mg | ORAL_CAPSULE | ORAL | Status: DC
  Administered 2011-04-09 – 2011-04-12 (×9): 400 mg via ORAL
  Filled 2011-04-09 (×13): qty 1

## 2011-04-09 NOTE — Progress Notes (Signed)
[facility] MD Progress Note  04/09/2011 8:58 AM  Diagnosis:   Axis I: Bipolar, mixed, Substance Abuse, Substance Induced Mood Disorder and Alcohol Dependence Axis II: Deferred Axis III:  Past Medical History  Diagnosis Date  . Neuropathy   . Diabetes mellitus   . Bipolar affect, depressed   . Hypertension   . Stroke     Mini stroke about 68yrs ago  . Arthritis    Axis IV: economic problems, educational problems and occupational problems Axis V: 41-50 serious symptoms  ADL's:  Impaired by neuropathy and edema  Sleep:  Yes,  AEB:  Appetite:  Yes,  AEB:  Suicidal Ideation:   Plan:  No  Intent:    Means:    Homicidal Ideation:   Plan:  No  Intent:    Means:    AEB (as evidenced by):  Mental Status: General Appearance [name]:  Disheveled Eye Contact:  Good Motor Behavior:  Restlestness Speech:  Normal Level of Consciousness:  Alert Mood:  Depressed Affect:  Blunt Anxiety Level:  Moderate Thought Process:  Coherent Thought Content:  WNL Perception:  Normal Judgment:  Fair Insight:  Present Cognition:  Orientation time, place and person Memory Remote Concentration Yes Sleep:  Number of Hours: 6.75   Vital Signs:Blood pressure 126/77, pulse 82, temperature 98.2 F (36.8 C), temperature source Oral, resp. rate 18, height 5' 9.5" (1.765 m), weight 109.317 kg (241 lb). Body mass index is 35.08 kg/(m^2).   Lab Results:  Results for orders placed during the hospital encounter of 04/07/11 (from the past 48 hour(s))  GLUCOSE, CAPILLARY     Status: Abnormal   Collection Time   04/07/11 10:37 PM      Component Value Range Comment   Glucose-Capillary 267 (*) 70 - 99 (mg/dL)   GLUCOSE, CAPILLARY     Status: Abnormal   Collection Time   04/08/11 12:04 PM      Component Value Range Comment   Glucose-Capillary 258 (*) 70 - 99 (mg/dL)   GLUCOSE, CAPILLARY     Status: Abnormal   Collection Time   04/08/11  4:18 PM      Component Value Range Comment   Glucose-Capillary 200 (*) 70 - 99 (mg/dL)   GLUCOSE, CAPILLARY     Status: Abnormal   Collection Time   04/08/11  9:16 PM      Component Value Range Comment   Glucose-Capillary 201 (*) 70 - 99 (mg/dL)    Comment 1 Notify RN     GLUCOSE, CAPILLARY     Status: Abnormal   Collection Time   04/09/11  6:10 AM      Component Value Range Comment   Glucose-Capillary 137 (*) 70 - 99 (mg/dL)    Comment 1 Notify RN     GLUCOSE, CAPILLARY     Status: Abnormal   Collection Time   04/09/11  7:51 AM      Component Value Range Comment   Glucose-Capillary 193 (*) 70 - 99 (mg/dL)     Physical Findings: 2+ bilateral pitting edema in ankles and feet.  Treatment Plan Summary: Daily contact with patient to assess and evaluate symptoms and progress in treatment Medication management  Plan: Will continue safe detox.  Increase Neurontin target peripheral neuropathy.  Start Lasix target pedal edema.  Patient expresses desire to remain abstinent from alcohol and other substances and is willing to go to [facility] for residential treatment.  [name],[name] 04/09/2011, 8:58 AM

## 2011-04-09 NOTE — Progress Notes (Signed)
  Pt. attended and participated in aftercare planning group. Pt. accepted information on suicide prevention, warning signs to look for with suicide and crisis line numbers to use. The pt. agreed to call crisis line numbers if having warning signs or having thoughts of suicide. Pt. listed their current anxiety level as middle and that he is "feeling better". Pt stated he still has plans to go to a tx center and following would like to go and live with his family in [place] who he has not seen in 10 years.

## 2011-04-09 NOTE — Progress Notes (Signed)
  Pt is been isolative today and upon awakening stated he didn't feel good. But he didn't say why. On his inventory sheet he wrote sleep poor, appetite poor, attention poor, depression and hopelessness at 8. He didn't address any w/d symptoms and stated he had passive si. When asked he denied any si. He plans to see a counselor when he discharges. He stated after d/c he would have some problems staying on his medications. rn will monitor and safety checks continue every 15 mins.

## 2011-04-09 NOTE — Progress Notes (Signed)
[facility] Group Notes:  (Counselor/Nursing/MHT/Case Management/Adjunct)  04/09/2011 8:42 PM  Type of Therapy:  Psychoeducational Skills  Participation Level:  Minimal  Participation Quality:  Appropriate, Attentive, Sharing and Supportive  Affect:  Appropriate  Cognitive:  Alert, Appropriate and Oriented  Insight:  Good  Engagement in Group:  Good  Engagement in Therapy:  n/a  Modes of Intervention:  Activity, Education, Problem-solving and Support  Summary of Progress/Problems: Group focused on communication. Conn was quiet but attentive while group discussed why communication is important, communication do's and don'ts, and reviewed "I feel" statements. Group read an example letter out loud using "I feel" statements.  Wiliam was given assignment to dissect a personal conflict and how communication went wrong, and what they could change to improve it.   Wandra Scot 04/09/2011, 8:42 PM

## 2011-04-09 NOTE — Progress Notes (Signed)
  Pt stated his legs are swollen and he has neuropathy. He is on gabapentin and has a new order for lasix. Will monitor pt's pain closely. On his inventory sheet he wrote sleep poor, appetite improving, energy normal, attention improving, depression  At 5 and hopelessness at 9. W/d symptoms are tremor and chilling. Denies any si. Physical problems in the last 24 hrs was a headache, rash, blurred vision and pain.  Pain goal today is 8. When he goes home he plans to see his MD and go to aa meetings. He stated he has no problems staying on medications when he is discharged. RN will monitor and safety checks every 15 minutes continue.Marland Kitchen

## 2011-04-09 NOTE — Progress Notes (Signed)
  [facility] Group Notes: (Counselor/Nursing/MHT/Case Management/Adjunct)  04/09/2011 1:15 PM  Type of Therapy: Group Therapy, Dance/Movement Therapy  Participation Level: Active  Participation Quality: Appropriate  Affect: Appropriate  Cognitive: Appropriate  Insight: Limited  Engagement in Group: Good  Engagement in Therapy: Good  Modes of Intervention: Clarification, Problem-solving, Role-play, Socialization and Support   Summary of Progress/Problems:    Group discussed healthy and unhealthy ways of coping when feeling stuck. Pt. York Spaniel that he likes to take his dogs to the park as a healthy coping mechanism. Group also addressed how to recognize healthy and unhealthy relationships. To close group, pt. stated that he was thankful for being able to wake up tomorrow today.

## 2011-04-09 NOTE — Progress Notes (Signed)
  The patient became very anxious after speaking with his wife on the phone. He requested medication for anxiety. Before he received his medication he went into the evening substance abuse group. He was visibly much calmer after group. Stated the speaker was very good and interesting. He is hoping to follow up with AA after discharge. Requesting to get back on his Bipolar medications.

## 2011-04-10 DIAGNOSIS — S025XXA Fracture of tooth (traumatic), initial encounter for closed fracture: Secondary | ICD-10-CM | POA: Diagnosis not present

## 2011-04-10 LAB — GLUCOSE, CAPILLARY
Glucose-Capillary: 204 mg/dL — ABNORMAL HIGH (ref 70–99)
Glucose-Capillary: 287 mg/dL — ABNORMAL HIGH (ref 70–99)
Glucose-Capillary: 252 mg/dL — ABNORMAL HIGH (ref 70–99)

## 2011-04-10 MED ORDER — BENZOCAINE 10 % MT GEL
Freq: Four times a day (QID) | OROMUCOSAL | Status: DC | PRN
  Administered 2011-04-10: 1 via OROMUCOSAL
  Administered 2011-04-10 – 2011-04-11 (×6): via OROMUCOSAL

## 2011-04-10 NOTE — Progress Notes (Signed)
  Pt only complaint today was a tooth ache and an ordered was obtained for orajel. On his inventory sheet he wrote slept poor, appetite good, energy normal, attention improving, depression 2 and hopelessness 2.  No w/d symptoms and denied any suicide ideation. Some pain in the last 24 hrs some pain. The changes he plans to make when going home is not drink. He stated he knows his d/c plans. RN will monitor and safety checks continue every 15 mins.

## 2011-04-10 NOTE — Progress Notes (Signed)
  The patient is pleasant and attending all groups. He actively participates in the milieu. Edema still noted in his feet. States the Neurontin has helped relieve the  Neuropathy pain. Plans on being discharged on Monday to a rehab.

## 2011-04-10 NOTE — Progress Notes (Signed)
[facility] MD Progress Note  04/10/2011 9:32 AM  Diagnosis:   Axis I: See current hospital problem list Axis II: Deferred Axis III:  Past Medical History  Diagnosis Date  . Neuropathy   . Diabetes mellitus   . Bipolar affect, depressed   . Hypertension   . Stroke     Mini stroke about 65yrs ago  . Arthritis   Fractured tooth  Axis IV: Unchanged Axis V: 41-50 serious symptoms  ADL's:  Intact  Sleep:  No [name] new roommate kept pt up during the night  Appetite:  Yes,  AEB:  Suicidal Ideation:   Plan:  No  Intent:    Means:    Homicidal Ideation:   Plan:  No  Intent:    Means:    AEB (as evidenced by):  Mental Status: General Appearance [name]:  Disheveled Eye Contact:  Good Motor Behavior:  Restlestness Speech:  Normal Level of Consciousness:  Alert Mood:  Anxious Affect:  Appropriate Anxiety Level:  Moderate Thought Process:  Coherent Thought Content:  WNL Perception:  Normal Judgment:  Fair Insight:  Absent Cognition:  Orientation time, place and person Memory Remote Concentration Yes Sleep:  Number of Hours: 3   Vital Signs:Blood pressure 130/76, pulse 77, temperature 98 F (36.7 C), temperature source Oral, resp. rate 20, height 5' 9.5" (1.765 m), weight 109.317 kg (241 lb), SpO2 97.00%.  Lab Results:  Results for orders placed during the hospital encounter of 04/07/11 (from the past 48 hour(s))  GLUCOSE, CAPILLARY     Status: Abnormal   Collection Time   04/08/11 12:04 PM      Component Value Range Comment   Glucose-Capillary 258 (*) 70 - 99 (mg/dL)   GLUCOSE, CAPILLARY     Status: Abnormal   Collection Time   04/08/11  4:18 PM      Component Value Range Comment   Glucose-Capillary 200 (*) 70 - 99 (mg/dL)   GLUCOSE, CAPILLARY     Status: Abnormal   Collection Time   04/08/11  9:16 PM      Component Value Range Comment   Glucose-Capillary 201 (*) 70 - 99 (mg/dL)    Comment 1 Notify RN     GLUCOSE, CAPILLARY     Status: Abnormal   Collection Time   04/09/11  6:10 AM      Component Value Range Comment   Glucose-Capillary 137 (*) 70 - 99 (mg/dL)    Comment 1 Notify RN     GLUCOSE, CAPILLARY     Status: Abnormal   Collection Time   04/09/11  7:51 AM      Component Value Range Comment   Glucose-Capillary 193 (*) 70 - 99 (mg/dL)   GLUCOSE, CAPILLARY     Status: Abnormal   Collection Time   04/09/11 12:09 PM      Component Value Range Comment   Glucose-Capillary 221 (*) 70 - 99 (mg/dL)   GLUCOSE, CAPILLARY     Status: Abnormal   Collection Time   04/09/11  4:38 PM      Component Value Range Comment   Glucose-Capillary 404 (*) 70 - 99 (mg/dL)   GLUCOSE, CAPILLARY     Status: Abnormal   Collection Time   04/09/11  7:07 PM      Component Value Range Comment   Glucose-Capillary 339 (*) 70 - 99 (mg/dL)    Comment 1 Notify RN     GLUCOSE, CAPILLARY     Status: Abnormal   Collection Time  04/09/11  9:00 PM      Component Value Range Comment   Glucose-Capillary 324 (*) 70 - 99 (mg/dL)    Comment 1 Notify RN      Comment 2 Documented in Chart     GLUCOSE, CAPILLARY     Status: Abnormal   Collection Time   04/10/11  6:11 AM      Component Value Range Comment   Glucose-Capillary 204 (*) 70 - 99 (mg/dL)    Comment 1 Notify RN      Comment 2 Documented in Chart       Physical Findings: AIMS:  , ,  ,  ,    CIWA:  CIWA-Ar Total: 0  COWS:     Treatment Plan Summary: Daily contact with patient to assess and evaluate symptoms and progress in treatment Medication management  Plan: Order Orajel for tooth pain.  Pt will need dental exam and treatment after discharge Likely discharge tomorrow - 04/11/11  [name],Perrin 04/10/2011, 9:32 AM

## 2011-04-10 NOTE — Progress Notes (Signed)
[facility] Group Notes:  (Counselor/Nursing/MHT/Case Management/Adjunct)  04/10/2011 11:08 PM  Type of Therapy:  Psychoeducational Skills  Participation Level:  Minimal  Participation Quality:  Appropriate, Attentive and Drowsy  Affect:  Appropriate  Cognitive:  Alert, Appropriate and Oriented  Insight:  Good  Engagement in Group:  Good  Engagement in Therapy:  n/a  Modes of Intervention:  Activity, Education, Problem-solving, Socialization and Support  Summary of Progress/Problems: Group focused on gratitude. [name] was attentive while group discussed what gratitude is, how we can show it, what secondary gains can come of it, how it can strengthen support systems, and how it can increase self-worth. [name] listed things he is grateful for. [name] was encuraged to make a list of 20 things they are grateful for andto create a gratitrude journal to use daily. Group wrote down the things they are greatful for on leaves and taped them to a gratitude tree on the unit.    [name] 04/10/2011, 11:08 PM

## 2011-04-10 NOTE — Progress Notes (Signed)
  The patient has a blunted affect and depressed mood. Although the swelling in his feet has gone down, there is still a considerable amount of edema present. He is very pleasant and interacting appropriately in the milieu. Denies any withdrawal symptoms. Lost a lot of sleep due to his new roommate being disruptive.

## 2011-04-10 NOTE — Progress Notes (Signed)
[facility] Adult Inpatient Family/Significant Other Suicide Prevention Education  Suicide Prevention Education:  Education Completed; [name] (wife) [phone],  (name of family member/significant other) has been identified by the patient as the family member/significant other with whom the patient will be residing, and identified as the person(s) who will aid the patient in the event of a mental health crisis (suicidal ideations/suicide attempt).  With written consent from the patient, the family member/significant other has been provided the following suicide prevention education, prior to the and/or following the discharge of the patient.  The suicide prevention education provided includes the following:  Suicide risk factors  Suicide prevention and interventions  National Suicide Hotline telephone number  [facility] assessment telephone number  [facility] Emergency Assistance 911  [facility] and/or Residential Mobile Crisis Unit telephone number  Request made of family/significant other to:  Remove weapons (e.g., guns, rifles, knives), all items previously/currently identified as safety concern.    Remove drugs/medications (over-the-counter, prescriptions, illicit drugs), all items previously/currently identified as a safety concern.  The family member/significant other verbalizes understanding of the suicide prevention education information provided.  The family member/significant other agrees to remove the items of safety concern listed above.  [facility] 04/10/2011, 10:06 AM

## 2011-04-10 NOTE — Progress Notes (Signed)
[facility] Group Notes:  (Counselor/Nursing/MHT/Case Management/Adjunct)  04/10/2011 1:15 PM  Type of Therapy:  Group Therapy, Dance/Movement Therapy   Participation Level:  Active  Participation Quality:  Attentive, Sharing and Supportive  Affect:  Appropriate  Cognitive:  Appropriate  Insight:  Limited  Engagement in Group:  Good  Engagement in Therapy:  Limited  Modes of Intervention:  Clarification, Problem-solving, Role-play, Socialization and Support  Summary of Progress/Problems:  Pt participated in a group discussion on support systems. Pt spoke about what is needed to ask for help and how to be honest with themselves on what they truly need for successful recovery.  [name]  

## 2011-04-11 LAB — GLUCOSE, CAPILLARY: Glucose-Capillary: 393 mg/dL — ABNORMAL HIGH (ref 70–99)

## 2011-04-11 NOTE — Progress Notes (Signed)
[facility] Group Notes:  (Counselor/Nursing/MHT/Case Management/Adjunct)  04/11/2011 6:24 PM  Type of Therapy:  Group Therapy at 11:00am  Participation Level:  Minimal  Participation Quality:  Attentive and Sharing  Affect:  Flat  Cognitive:  Oriented  Insight:  Limited  Engagement in Group:  Limited  Engagement in Therapy:  Limited  Modes of Intervention:  Clarification, Problem-solving, Socialization and Support  Summary of Progress/Problems: Patient shared that his thinking and too much free time are his biggest obstacles to recovery. Pt was attentive yet quiet for remainder of group   Clide Dales 04/11/2011, 6:24 PM

## 2011-04-11 NOTE — Progress Notes (Signed)
[facility] Group Notes:  (Counselor/Nursing/MHT/Case Management/Adjunct)  04/11/2011 6:28 PM  Type of Therapy:  Psychoeducational Skills and group therapy at 1:15  Participation Level:  Minimal  Participation Quality:  Attentive  Affect:  Flat  Cognitive:  Oriented  Insight:  Limited  Engagement in Group:  None  Engagement in Therapy:  None  Modes of Intervention:  Clarification, Problem-solving, Socialization and Support  Summary of Progress/Problems: Pt was quiet but attentive to process group. Pt did share that he had some dental problems and was experiencing pain.   Clide Dales 04/11/2011, 6:28 PM

## 2011-04-11 NOTE — Progress Notes (Signed)
Patient ID: [name], male   DOB: [birth year], 55 y.o.   MRN: [id number]  Patient seen 1:1. Awake , alert, cooperative, friendly and conversive. Casually dressed. Good eye contact. Coherent thought processes. Talks openly about his substance use, xanax, opiods, cocaine. Has multiple medical problems, cracked tooth, neuropathy.  Denies any depression, suicidal thinking.  Good insight, will get back with AA. deterrant for relapsing is family. Plan: continue to monitor withdrawal symptoms.           Follow up with medical issues, dentist

## 2011-04-11 NOTE — Progress Notes (Signed)
Inpatient Diabetes Program Recommendations  AACE/ADA: New Consensus Statement on Inpatient Glycemic Control (2009)  Target Ranges:  Prepandial:   less than 140 mg/dL      Peak postprandial:   less than 180 mg/dL (1-2 hours)      Critically ill patients:  140 - 180 mg/dL   Reason for Visit: CBG's today thus far 188, 300  Inpatient Diabetes Program Recommendations Correction (SSI): Please consider increasing correction scale to resistant scale  Note:

## 2011-04-11 NOTE — Tx Team (Signed)
Interdisciplinary Treatment Plan Update (Adult)  Date:  04/11/2011  Time Reviewed:  10:43 AM   Progress in Treatment: Attending groups: Yes Participating in groups:  Yes Taking medication as prescribed: Yes Tolerating medication:  Yes Family/Significant othe contact made:   Patient understands diagnosis:  Yes Discussing patient identified problems/goals with staff:  Yes Medical problems stabilized or resolved:  Yes Denies suicidal/homicidal ideation: Yes Issues/concerns per patient self-inventory:  None identified Other: N/A  New problem(s) identified: None Identified  Reason for Continuation of Hospitalization: Anxiety Depression Medication stabilization Suicidal ideation Withdrawal symptoms  Interventions implemented related to continuation of hospitalization: mood stabilization, medication monitoring and adjustment, group therapy and psycho education, safety checks q 15 mins  Additional comments: N/A  Estimated length of stay: 3-5 days  Discharge Plan: SW will assess for appropriate for long term treatment options  New goal(s): N/A  Review of initial/current patient goals per problem list:   1.  Goal(s): Substance Use  Met:  No  Target date: by discharge  As evidenced by: complete detox protocol and refer to treatment  2.  Goal (s): Depression  Met:  No  Target date: by discharge  As evidenced by: Reduce depression from a 10 to a 3.  Pt ranks at a 7 today.  3.  Goal(s): Anxiety  Met:  No  Target date: by discharge  As evidenced by: Reduce anxiety from a 10 to a 3.  Pt ranks at a 10 today.  4.  Goal(s):  Met:  No  Target date: by discharge  As evidenced by:   Attendees: Patient:     Family:     Physician:  04/11/2011 10:43 AM   Nursing:   Joslyn Devon, RN 04/11/2011 10:43 AM   Case Manager:  Reyes Ivan, LCSWA 04/11/2011  10:43 AM   Counselor:  Ronda Fairly, LCSWA 04/11/2011  10:43 AM   Other:     Other:     Other:       Other:      Scribe for Treatment Team:   Carmina Miller, 04/11/2011 10:43 AM

## 2011-04-11 NOTE — Progress Notes (Signed)
Pt attended discharge planning group and actively participated. Pt presents with flat affect and depressed mood. Pt was open with sharing the reason for entering the hospital.  Pt states that he recently relapsed on alcohol, pills and cocaine.  Pt states that he is on disability and feels like he has too much time on his hands.  Pt states that he is depressed about all of his health problems.  Pt states that he was at [facility] 2.5 years ago and started using since then.  Pt states he lives in [place] with his fiance and has transportation.  Pt ranks depression at a 7 and anxiety at a 10 today.  Pt denies SI.  Pt wants long term treatment.  SW will assess for appropriate referrals for pt.  Safety planning and suicide prevention discussed.     Reyes Ivan, LCSWA 04/11/2011  11:37 AM

## 2011-04-11 NOTE — Progress Notes (Signed)
Patient attended discharged discharge planning group this am. He spoke openly about his cocaine and alcohol abuse.  He stated that he was also using lortab and xanax also.  He rates his depression as a 7.  He rates his anxiety high as a 10.  He stated that in order "to stay alive" he needed to get off the drugs.  Patient complains of bilateral edemadous extremities.  He also has a broken tooth which is cutting into his jaw and tongue.  Patient states he is on disability and has "too much time on his hands."  Continue to assess and maintain safety.

## 2011-04-12 DIAGNOSIS — F1994 Other psychoactive substance use, unspecified with psychoactive substance-induced mood disorder: Secondary | ICD-10-CM

## 2011-04-12 LAB — GLUCOSE, CAPILLARY

## 2011-04-12 MED ORDER — THERA M PLUS PO TABS: 1.0000 | ORAL_TABLET | Freq: Every day | ORAL | Status: DC

## 2011-04-12 MED ORDER — GABAPENTIN 400 MG PO CAPS: 400.0000 mg | ORAL_CAPSULE | ORAL | Status: DC

## 2011-04-12 MED ORDER — INSULIN GLARGINE 100 UNIT/ML ~~LOC~~ SOLN: 30.0000 [IU] | Freq: Every day | SUBCUTANEOUS | Status: DC

## 2011-04-12 MED ORDER — TRAZODONE HCL 100 MG PO TABS: 100.0000 mg | ORAL_TABLET | Freq: Every day | ORAL | Status: DC

## 2011-04-12 NOTE — Discharge Summary (Signed)
Physician Discharge Summary  Patient ID: [name] MRN: [id number] DOB/AGE: 1956-01-17 55 y.o.  Admit date: 04/07/2011 Discharge date: 04/12/2011  Admission Diagnoses: Alcohol Abuse Discharge Diagnoses:  Principal Problem:  *Alcohol abuse Active Problems:  Diabetes mellitus type 2 with complications  Fractured tooth   Discharged Condition: fully alert cooperative casually  dressed. Good eye contact. Denies any withdrawal symptoms. Denies any suicidal or homicidal thinking.    Hospital Course: [name] was admitted after relapsing on alcohol and using pain pills. Wanted to get better due to medical problems. Started on the Librium protocol. He safely detoxed and felt ready to go home. Had good insight on follow up and appeared very motivated.     Discharge Exam: Blood pressure 136/83, pulse 81, temperature 97.8 F (36.6 C), temperature source Oral, resp. rate 18, height 5' 9.5" (1.765 m), weight 109.317 kg (241 lb), SpO2 97.00%.   Disposition: home  Discharge Orders    Future Orders Please Complete By Expires   Diet - low sodium heart healthy        Current Discharge Medication List    START taking these medications   Details  insulin glargine (LANTUS) 100 UNIT/ML injection Inject 30 Units into the skin at bedtime. Qty: 10 mL, Refills: 0    Multiple Vitamins-Minerals (MULTIVITAMINS THER. W/MINERALS) TABS Take 1 tablet by mouth daily. Qty: 30 each, Refills: 0    traZODone (DESYREL) 100 MG tablet Take 1 tablet (100 mg total) by mouth at bedtime. Qty: 30 tablet, Refills: 0      CONTINUE these medications which have CHANGED   Details  gabapentin (NEURONTIN) 400 MG capsule Take 1 capsule (400 mg total) by mouth 3 (three) times daily at 8am, 2pm and bedtime. Qty: 90 capsule, Refills: 0      CONTINUE these medications which have NOT CHANGED   Details  aspirin EC 81 MG tablet Take 81 mg by mouth daily.      lisinopril (PRINIVIL,ZESTRIL) 10 MG tablet Take 10 mg by  mouth daily.     metFORMIN (GLUCOPHAGE) 500 MG tablet Take 500 mg by mouth 2 (two) times daily with a meal.         Follow-up Information    Follow up with [facility] on 04/25/2011. (Assessment scheduled for 8:00 am)    Contact information:   [address] [place], [place] [address] [phone]      Follow up with [facility]. (Walk in clinic Monday - Friday 8-12 pm)    [address]:   [address] [place], [place] [address] [phone]         Signed: Mechele Dawley 04/12/2011, 1:47 PM

## 2011-04-12 NOTE — Progress Notes (Signed)
Suicide Risk Assessment  Discharge Assessment     Demographic factors:  Assessment Details Time of Assessment: Admission Information Obtained From: Patient Current Mental Status:  Current Mental Status:  (Denied) Risk Reduction Factors:  Risk Reduction Factors: Religious beliefs about death  CLINICAL FACTORS:  Depression - Resolved. No symptoms of Alcohol withdrawal.  Diagnosis:  Axis I:  Alcohol Dependence Substance Induced Mood Disorder  The patient was seen today and reports the following:   ADL's: Intact  Sleep: The patient reports to sleeping well with difficulty.  Appetite: Good Appetite.   Mild>(1-10) >Severe  Hopelessness (1-10): 0 Depression (1-10): 0  Anxiety (1-10): 2-3   Suicidal Ideation: The patient adamantly denies any suicidal ideations today.  Plan: No  Intent: No  Means: No   Homicidal Ideation: The patient adamantly denies any homicidal ideations.  Plan: No  Intent: No.  Means: No   Alert and Oriented x 3. Eye Contact: Good General Appearance/Behavior: Neat and Casual Affect: Bright and Full. Mood: Euthymic Anxiety Level: Mild. Motor Behavior: Normal Speech - Normal. Thought Process: Coherent  Thought Content: WNL. No auditory or visual hallucinations or delusional thinking.  Perception: Normal  Judgment: Good.  Insight: Good  Cognition: Orientation time, place and person.   Treatment Plan Summary:  1. Daily contact with patient to assess and evaluate symptoms and progress in treatment  2. Medication management  3. The patient will deny suicidal ideations or homicidal ideations for 48 hours prior to discharge and have a depression and anxiety rating of 3 or less. The patient will also deny any auditory or visual hallucinations or delusional thinking.   Plan:  1. Will continue to monitor.  2. Continue current medications. 3. Discharge today at the patient's request.  SUICIDE RISK:   Minimal: No identifiable suicidal ideation.   Patients presenting with no risk factors but with morbid ruminations; may be classified as minimal risk based on the severity of the depressive symptoms   [name] 04/12/2011, 12:47 PM

## 2011-04-12 NOTE — Progress Notes (Signed)
[facility] Group Notes:  (Counselor/Nursing/MHT/Case Management/Adjunct)  04/12/2011 1:21 PM  Type of Therapy:  Processing Group at 11:00am  Participation Level:  Active  Participation Quality:  Appropriate, Attentive, Sharing and Supportive  Affect:  Appropriate  Cognitive:  Appropriate  Insight:  Good  Engagement in Group:  Good  Engagement in Therapy:  Good  Modes of Intervention:  Clarification, Problem-solving, Socialization and Support  Summary of Progress/Problems: Pt shared multiple feelings about diagnosis; Valery states that "addiction is definitely like another member of the family." "I think I'm smarter than it at times; yet know it is in control." "that best friend part is so true as I would give it more attention than the real live people in my life." "It is a love/hate relationship for sure."   Clide Dales 04/12/2011, 1:21 PM

## 2011-04-12 NOTE — Progress Notes (Signed)
Recreation Therapy Group Note  Date: 04/12/2011         Time: 1000       Group Topic/Focus: Patient invited to participate in animal assisted therapy. Pets as a coping skill and responsibility were discussed.   Participation Level: Active  Participation Quality: Appropriate and Attentive  Affect: Appropriate  Cognitive: Appropriate and Oriented   Additional Comments: Patient spoke about his two dogs at home, how they can sense when he isn't doing well. 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

## 2011-04-12 NOTE — Discharge Summary (Signed)
Pt attended discharge planning group and actively participated.  Pt reports feeling stable to d/c today.  Pt ranks depression at a 0, denies SI and states anxiety is mild due to wanting to d/c.  Pt will follow up at [facility] on 12/3.  Pt is responsible for getting there for the assessment.  Pt states his fiance or neighbor will be able to transport pt there on that date.  Pt states he plans to stay sober by staying at home and going to [employer]  Pt to return home with fiance.  Pt also requested follow up for mental health; SW will refer pt to [facility].  No further d/c needs voiced.  Safety planning and suicide prevention discussed.   Met pt's goal by scheduling follow up.    [name], LCSWA 04/12/2011  11:27 AM

## 2011-04-12 NOTE — Progress Notes (Signed)
Nursing D/C Note: patient discharged home today per MD order.  Patient denies SI/HI/AVH.  Patient given prescriptions and reviewed medication list and discharge instructions.  Patient was receptive to all information.  Patient will follow up with [facility] and [facility].  Patient is motivated for treatment.  He left ambulatory with a friend.  He received all belongings.

## 2011-04-12 NOTE — Progress Notes (Signed)
Pt. Reports that he has completed withdrawal and is only experiencing some mild anxiety.     Pt.  States that he wants to maintain sobriety for his self and family.  He states that he has young Grandchildren that are aware that he has a problem and this shames him.  Pt. thinks he is going to be discharged in the am and says he is ready.  Pt. Does complain of some tooth pain and received tylenol and anbesol which reduced the pain.  No other complaints of pain or discomfort noted at this time.

## 2011-04-13 NOTE — Progress Notes (Signed)
Patient Discharge Instructions:  Dictated admission note faxed, Date faxed:  11/21 D/C instructions faxed, Date faxed:  11/21 D/C Summary faxed, Date faxed:  11/21 Med. Rec. Form faxed, Date faxed:  11/21 To [facility] and Evonnie Pat, 04/13/2011, 1:34 PM

## 2011-11-16 ENCOUNTER — Inpatient Hospital Stay (HOSPITAL_COMMUNITY): Admission: AD | Admit: 2011-11-16 | Payer: Medicare PPO | Source: Ambulatory Visit | Admitting: Psychiatry

## 2011-11-16 ENCOUNTER — Emergency Department (HOSPITAL_COMMUNITY)
Admission: EM | Admit: 2011-11-16 | Discharge: 2011-11-16 | Disposition: A | Discharge status: Home / Self Care | Payer: Medicare PPO | Attending: Emergency Medicine | Admitting: Emergency Medicine

## 2011-11-16 ENCOUNTER — Encounter (HOSPITAL_COMMUNITY): Payer: Self-pay | Admitting: Emergency Medicine

## 2011-11-16 DIAGNOSIS — F1994 Other psychoactive substance use, unspecified with psychoactive substance-induced mood disorder: Secondary | ICD-10-CM

## 2011-11-16 DIAGNOSIS — F319 Bipolar disorder, unspecified: Secondary | ICD-10-CM | POA: Insufficient documentation

## 2011-11-16 DIAGNOSIS — Z794 Long term (current) use of insulin: Secondary | ICD-10-CM | POA: Insufficient documentation

## 2011-11-16 DIAGNOSIS — F10229 Alcohol dependence with intoxication, unspecified: Secondary | ICD-10-CM

## 2011-11-16 DIAGNOSIS — I10 Essential (primary) hypertension: Secondary | ICD-10-CM | POA: Insufficient documentation

## 2011-11-16 DIAGNOSIS — F1910 Other psychoactive substance abuse, uncomplicated: Secondary | ICD-10-CM

## 2011-11-16 DIAGNOSIS — F1410 Cocaine abuse, uncomplicated: Secondary | ICD-10-CM

## 2011-11-16 DIAGNOSIS — E119 Type 2 diabetes mellitus without complications: Secondary | ICD-10-CM | POA: Insufficient documentation

## 2011-11-16 LAB — COMPREHENSIVE METABOLIC PANEL
AST: 69 U/L — ABNORMAL HIGH (ref 0–37)
Albumin: 3.1 g/dL — ABNORMAL LOW (ref 3.5–5.2)
BUN: 9 mg/dL (ref 6–23)
Calcium: 8.8 mg/dL (ref 8.4–10.5)
Creatinine, Ser: 0.77 mg/dL (ref 0.50–1.35)

## 2011-11-16 LAB — CBC
HCT: 35.9 % — ABNORMAL LOW (ref 39.0–52.0)
MCH: 34.8 pg — ABNORMAL HIGH (ref 26.0–34.0)
MCHC: 34.5 g/dL (ref 30.0–36.0)
MCV: 100.8 fL — ABNORMAL HIGH (ref 78.0–100.0)
Platelets: 72 10*3/uL — ABNORMAL LOW (ref 150–400)
RDW: 13.6 % (ref 11.5–15.5)

## 2011-11-16 LAB — RAPID URINE DRUG SCREEN, HOSP PERFORMED
Amphetamines: NOT DETECTED
Cocaine: POSITIVE — AB
Opiates: NOT DETECTED
Tetrahydrocannabinol: NOT DETECTED

## 2011-11-16 LAB — GLUCOSE, CAPILLARY
Glucose-Capillary: 208 mg/dL — ABNORMAL HIGH (ref 70–99)
Glucose-Capillary: 215 mg/dL — ABNORMAL HIGH (ref 70–99)

## 2011-11-16 LAB — ETHANOL: Alcohol, Ethyl (B): 141 mg/dL — ABNORMAL HIGH (ref 0–11)

## 2011-11-16 MED ORDER — INSULIN ASPART 100 UNIT/ML ~~LOC~~ SOLN
0.0000 [IU] | Freq: Three times a day (TID) | SUBCUTANEOUS | Status: DC
  Administered 2011-11-16: 5 [IU] via SUBCUTANEOUS
  Administered 2011-11-16: 3 [IU] via SUBCUTANEOUS
  Administered 2011-11-16: 5 [IU] via SUBCUTANEOUS
  Filled 2011-11-16 (×4): qty 1

## 2011-11-16 MED ORDER — THERA M PLUS PO TABS: 1.0000 | ORAL_TABLET | Freq: Every day | ORAL | Status: DC

## 2011-11-16 MED ORDER — INSULIN ASPART 100 UNIT/ML ~~LOC~~ SOLN
4.0000 [IU] | Freq: Three times a day (TID) | SUBCUTANEOUS | Status: DC
  Administered 2011-11-16 (×3): 4 [IU] via SUBCUTANEOUS
  Filled 2011-11-16 (×4): qty 1

## 2011-11-16 MED ORDER — LORAZEPAM 1 MG PO TABS
1.0000 mg | ORAL_TABLET | Freq: Three times a day (TID) | ORAL | Status: DC | PRN
  Administered 2011-11-16: 1 mg via ORAL
  Filled 2011-11-16: qty 1

## 2011-11-16 MED ORDER — GABAPENTIN 400 MG PO CAPS
400.0000 mg | ORAL_CAPSULE | ORAL | Status: DC
  Administered 2011-11-16 (×2): 400 mg via ORAL
  Filled 2011-11-16 (×5): qty 1

## 2011-11-16 MED ORDER — ALUM & MAG HYDROXIDE-SIMETH 200-200-20 MG/5ML PO SUSP
30.0000 mL | ORAL | Status: DC | PRN
Start: 2011-11-16 — End: 2011-11-16

## 2011-11-16 MED ORDER — INSULIN GLARGINE 100 UNIT/ML ~~LOC~~ SOLN: 30.0000 [IU] | Freq: Every day | SUBCUTANEOUS | Status: DC

## 2011-11-16 MED ORDER — ADULT MULTIVITAMIN W/MINERALS CH
1.0000 | ORAL_TABLET | Freq: Every day | ORAL | Status: DC
  Administered 2011-11-16: 1 via ORAL
  Filled 2011-11-16: qty 1

## 2011-11-16 MED ORDER — METFORMIN HCL 500 MG PO TABS
500.0000 mg | ORAL_TABLET | Freq: Two times a day (BID) | ORAL | Status: DC
  Administered 2011-11-16 (×2): 500 mg via ORAL
  Filled 2011-11-16 (×3): qty 1

## 2011-11-16 MED ORDER — ACETAMINOPHEN 325 MG PO TABS
650.0000 mg | ORAL_TABLET | ORAL | Status: DC | PRN
  Administered 2011-11-16: 650 mg via ORAL
  Filled 2011-11-16: qty 2

## 2011-11-16 MED ORDER — ONDANSETRON HCL 4 MG PO TABS: 4.0000 mg | ORAL_TABLET | Freq: Three times a day (TID) | ORAL | Status: DC | PRN

## 2011-11-16 MED ORDER — ASPIRIN EC 81 MG PO TBEC
81.0000 mg | DELAYED_RELEASE_TABLET | Freq: Every day | ORAL | Status: DC
  Administered 2011-11-16: 81 mg via ORAL
  Filled 2011-11-16: qty 1

## 2011-11-16 MED ORDER — LISINOPRIL 10 MG PO TABS
10.0000 mg | ORAL_TABLET | Freq: Every day | ORAL | Status: DC
  Administered 2011-11-16: 10 mg via ORAL
  Filled 2011-11-16: qty 1

## 2011-11-16 MED ORDER — ZOLPIDEM TARTRATE 5 MG PO TABS: 5.0000 mg | ORAL_TABLET | Freq: Every evening | ORAL | Status: DC | PRN
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
## 2011-11-16 NOTE — ED Notes (Signed)
Pt discharged to care of self after denying detox at [facility]. Departs unit in safe and stable condition, wife to pick him up.

## 2011-11-16 NOTE — ED Provider Notes (Signed)
History     CSN: [id number]  Arrival date & time 11/16/11  8469   First MD Initiated Contact with Patient 11/16/11 [phone]      Chief Complaint  Patient presents with  . Medical Clearance    (Consider location/radiation/quality/duration/timing/severity/associated sxs/prior treatment) HPI 56 yo male presents to the ER with complaint of polysubstance abuse, bipolar disease.  Pt is requesting help with managing his disease as well as help with detox.  Pt has been through detox and rehab in the past as well as hospitalization for his bipolar. Patient reports this time is different, as he does not want his grandchildren to watch him drinking and drugging.  Past Medical History  Diagnosis Date  . Neuropathy   . Diabetes mellitus   . Bipolar affect, depressed   . Hypertension   . Stroke     Mini stroke about 69yrs ago  . Arthritis     Past Surgical History  Procedure Date  . Fracture surgery     Leg and arm 11yrs ago    Family History  Problem Relation Age of Onset  . Hypotension Mother     History  Substance Use Topics  . Smoking status: Current Everyday Smoker -- .5 years    Types: Cigarettes  . Smokeless tobacco: Not on file  . Alcohol Use: 7.2 oz/week    12 Cans of beer per week     heavy       Review of Systems  All other systems reviewed and are negative.   other than listed in history of present illness  Allergies  Review of patient's allergies indicates no known allergies.  Home Medications   Current Outpatient Rx  Name Route Sig Dispense Refill  . ALPRAZOLAM 2 MG PO TABS Oral Take 2 mg by mouth 3 (three) times daily as needed. For anxiety    . ASPIRIN EC 81 MG PO TBEC Oral Take 81 mg by mouth daily.      Marland Kitchen DIAZEPAM 10 MG PO TABS Oral Take 10 mg by mouth every 6 (six) hours as needed. For anxiety    . GABAPENTIN 400 MG PO CAPS Oral Take 1 capsule (400 mg total) by mouth 3 (three) times daily at 8am, 2pm and bedtime. 90 capsule 0  . INSULIN GLARGINE 100  UNIT/ML [place] SOLN Subcutaneous Inject 30 Units into the skin at bedtime. 10 mL 0  . LISINOPRIL 10 MG PO TABS Oral Take 10 mg by mouth daily.     Marland Kitchen METFORMIN HCL 500 MG PO TABS Oral Take 500 mg by mouth 2 (two) times daily with a meal.      . THERA M PLUS PO TABS Oral Take 1 tablet by mouth daily. 30 each 0    BP 135/65  Pulse 83  Temp 98.2 F (36.8 C) (Oral)  Resp 18  SpO2 98%  Physical Exam  Nursing note and vitals reviewed. Constitutional: He is oriented to person, place, and time. He appears well-developed and well-nourished.  HENT:  Head: Normocephalic and atraumatic.  Nose: Nose normal.  Mouth/Throat: Oropharynx is clear and moist.  Eyes: Conjunctivae and EOM are normal. Pupils are equal, round, and reactive to light.  Neck: Normal range of motion. Neck supple. No JVD present. No tracheal deviation present. No thyromegaly present.  Cardiovascular: Normal rate, regular rhythm, normal heart sounds and intact distal pulses.  Exam reveals no gallop and no friction rub.   No murmur heard. Pulmonary/Chest: Effort normal and breath sounds normal. No stridor.  No respiratory distress. He has no wheezes. He has no rales. He exhibits no tenderness.  Abdominal: Soft. Bowel sounds are normal. He exhibits no distension and no mass. There is no tenderness. There is no rebound and no guarding.  Musculoskeletal: Normal range of motion. He exhibits tenderness (tenderness with palpation of feet, lower legs and all joints). He exhibits no edema.  Lymphadenopathy:    He has no cervical adenopathy.  Neurological: He is oriented to person, place, and time. He has normal reflexes. No cranial nerve deficit. He exhibits normal muscle tone. Coordination normal.  Skin: Skin is dry. No rash noted. No erythema. No pallor.  Psychiatric: He has a normal mood and affect. His behavior is normal. Judgment and thought content normal.    ED Course  Procedures (including critical care time)  Labs Reviewed  CBC -  Abnormal; Notable for the following:    RBC 3.56 (*)     Hemoglobin 12.4 (*)     HCT 35.9 (*)     MCV 100.8 (*)     MCH 34.8 (*)     Platelets 72 (*)     All other components within normal limits  COMPREHENSIVE METABOLIC PANEL - Abnormal; Notable for the following:    Sodium 133 (*)     Glucose, Bld 257 (*)     Albumin 3.1 (*)     AST 69 (*)  NO VISIBLE HEMOLYSIS   Alkaline Phosphatase 123 (*)     Total Bilirubin 1.5 (*)     All other components within normal limits  ETHANOL - Abnormal; Notable for the following:    Alcohol, Ethyl (B) 141 (*)     All other components within normal limits  URINE RAPID DRUG SCREEN (HOSP PERFORMED) - Abnormal; Notable for the following:    Cocaine POSITIVE (*)     Benzodiazepines POSITIVE (*)     All other components within normal limits   No results found.   1. Polysubstance abuse   2. Bipolar disorder   3. Diabetes mellitus, type 2       MDM  56 year old male with bipolar and polysubstance abuse. Patient wanting help. We'll get screening labs and plan to get him medically cleared and discussed with act for evaluation        Olivia Mackie, MD 11/16/11 [phone]

## 2011-11-16 NOTE — [facility] Counselor (Signed)
Spoke with Pt to address some questions from [facility] Assessment. Pt stated he did not follow up with [facility] after his last discharge, explaining he did not have a ride due to selling a vehicle and having cataract surgery on his eye. Pt admitted that "he let it go" but is currently motivated to get sober and get on some "bipolar medication". Pt discussed know knowing about [facility], which could help him get to OPT services. He also expressed his desire to get "straightened out" so he can go to visit his 5 grandchildren in [place].

## 2011-11-16 NOTE — [facility] Counselor (Signed)
Info faxed to [facility] for review.
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
## 2011-11-16 NOTE — ED Notes (Signed)
Pt states he is here for detox from drugs and alcohol  Pt states he does cocaine, valium, xanax, and oxycodone  Pt states he is bipolar  Pt denies SI/HI at this time

## 2011-11-16 NOTE — ED Notes (Signed)
MD at bedside. 

## 2011-11-16 NOTE — ED Provider Notes (Signed)
He is seeking assistance for alcoholism. He would like to be placed. ACT consulted.  Flint Melter, MD 11/16/11 1006
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
## 2011-11-16 NOTE — ED Provider Notes (Signed)
Pt presenting requesting detox from drug/etoh.  No SI.  Pt seen by Psychiatry MD, OK to d/c to f/u with [facility] as outpt.  Will d/c with resources.   Laray Anger, DO 11/16/11 2010

## 2011-11-16 NOTE — Discharge Instructions (Signed)
RESOURCE GUIDE  Chronic Pain Problems: Contact [facility]  [phone] Patients need to be referred by their primary care doctor.  Insufficient Money for Medicine: Contact United Way:  call "211" or [facility] [phone].  No Primary Care Doctor: - Call Health Connect  [phone] - can help you locate a primary care doctor that  accepts your insurance, provides certain services, etc. - Physician Referral Service- 1-800-533-3463  Agencies that provide inexpensive medical care: - [facility]  [phone] - [facility]  [phone] - [facility]  [phone] - [facility]  [phone] - Planned Parenthood  [phone] - [facility]  [phone]  Medicaid-accepting [place] Providers: - [facility]- [address]  [phone], Mon-Fri 9am-7pm, Sat 9am-1pm - [facility]- [address], [address]  [phone] - [facility]- [address], [address]  [phone] - [facility]- [address] [place] [address]  [phone] - Veita Bland- [address], [phone]  Only accepts [place] Access Medicaid patients after they have their name  applied to their card  Self Pay (no insurance) in [place]: - Sickle Cell Patients: Dr Eric Dean, [facility]  [address], [phone] - [facility]- [address]  [phone]       -     [facility] [place]- [address]       -     [facility]- see information above (Speak to Pam H if you do not have insurance)       -  [facility]- [address], [phone]       -  [facility] [place]- [address],  [phone]       -  [facility]- [address] [place] [address], [phone]       -  Dr Osei-Bonsu-  [address], [place], [phone]       -  [facility]- [address], [phone]       -  [facility]- [address] [place] [address], [phone], also [address], [phone]       -    [facility]- [address], [phone], 1st & 3rd Saturday   every month, 10am-1pm  1) Find a Doctor and Pay Out of Pocket Although you won't have to find out who is covered by your insurance plan, it is a good idea to ask around and get recommendations. You will then need to call the office and see if the doctor you have chosen will accept you as a new patient and what types of options they offer for patients who are self-pay. Some doctors offer discounts or will set up payment plans for their patients who do not have insurance, but you will need to ask so you aren't surprised when you get to your appointment.  2) Contact Your Local Health Department Not all health departments have doctors that can see patients for sick visits, but many do, so it is worth a call to see if yours does. If you don't know where your local health department is, you can check in your phone book. The CDC also has a tool to help you locate your state's health department, and many state websites also have   listings of all of their local health departments.  3) Find a Walk-in Clinic If your illness is not likely to be very severe or complicated, you may want to try a walk in clinic. These are popping up all over the country in pharmacies, drugstores, and shopping centers. They're usually staffed by nurse practitioners or physician assistants that have been trained to treat common illnesses and complaints. They're usually fairly quick and inexpensive. However, if you have serious medical issues or chronic medical problems, these are probably not your best option  STD Testing - [place] [facility] [place], [facility] Clinic, [address], [place], phone [phone] or 1-877-539-9860.  Monday - Friday, call for an appointment. - [place]  [facility] [place], STD Clinic, [address], [place], phone [phone] or 1-877-539-9860.  Monday - Friday, call for an appointment.  Abuse/Neglect: - [place] Child Abuse Hotline (336) 641-3795 - [place] Child Abuse Hotline 800-378-5315 (After Hours)  Emergency Shelter:  [facility] (336) 271-5985  Maternity Homes: - [facility] (336) 275-9566 - [facility] (704) 372-4663  MRSA Hotline #:   832-7006  [place] Resources  [facility] [place]  [employer] [place] [facility] [address]                 [address]         [address]  [place]                                               [place]                              [place] Phone:  [phone]                                  Phone:  [phone]                   Phone:  [phone]  [place] [facility], [phone] - [place] Services - [facility]- 1-888-581-9988       -     [facility] in [place], [address],                                  336-349-4454, Insurance  [place] Child Abuse Hotline (336) 342-1394 or (336) 342-3537 (After Hours)   Behavioral Health Services  Substance Abuse Resources: - [facility]  336-882-2125 - [facility] 336-784-9470 - [facility] 336-285-9073 - [facility] 336-845-3988 - Residential & Outpatient Substance Abuse Program  800-659-3381  Psychological Services: -  Health  [phone] - [facility]  [phone] - [place] [facility], [address], [place], ACCESS LINE: 1-800-853-5163 or 336-641-4981, Http://www.[facility].com/services/adult.htm  Dental Assistance  If unable to pay or uninsured, contact:  [facility] or [place] [facility] to become qualified for the adult dental  clinic.  Patients with Medicaid:  [facility] [address], [phone], [phone]  If unable   to pay, or uninsured, contact [facility] 575-176-1488) or [facility] Department [phone] in [place], [phone] in [facility]) to become qualified for the adult dental clinic  Other Low-Cost Community Dental Services: - [facility]- [address] [place], [place], [place], [address], [phone], Ext. [phone], 2nd and 4th Thursday of the month at 6:30am.  10 clients each day by appointment, can sometimes see walk-in patients if someone does not show for an appointment. [facility]- [address] [place], [place], [address], [phone] - [facility]- [address] [place], [place], [address], [phone] [facility] Department- [phone] [facility] Department- [phone] [facility] [phone]     Take your usual prescriptions as previously directed.  Call the substance abuse referrals given to you today to schedule a follow up appointment within the next week.  Return to the Emergency Department immediately sooner if worsening.

## 2011-11-16 NOTE — Progress Notes (Signed)
Clinical information reviewed with Dr. Dan Humphreys and patient accepted if he is agreable to the conditions that he will not receive narcotics during his hospitalization,  that he will be offered alternate ways of managing his pain and that he will attend AA/NA. [name] in assessment relayed this information to [name]. with ACT. Awaiting patients response. Doristine Locks RN [facility] A/C

## 2012-01-21 ENCOUNTER — Encounter (HOSPITAL_COMMUNITY): Payer: Self-pay | Admitting: *Deleted

## 2012-01-21 ENCOUNTER — Emergency Department (HOSPITAL_COMMUNITY)
Admission: EM | Admit: 2012-01-21 | Discharge: 2012-01-21 | Disposition: A | Discharge status: Home / Self Care | Payer: Medicare PPO | Attending: Emergency Medicine | Admitting: Emergency Medicine

## 2012-01-21 DIAGNOSIS — F1010 Alcohol abuse, uncomplicated: Secondary | ICD-10-CM | POA: Insufficient documentation

## 2012-01-21 DIAGNOSIS — F17200 Nicotine dependence, unspecified, uncomplicated: Secondary | ICD-10-CM | POA: Insufficient documentation

## 2012-01-21 DIAGNOSIS — R52 Pain, unspecified: Secondary | ICD-10-CM | POA: Insufficient documentation

## 2012-01-21 DIAGNOSIS — Z760 Encounter for issue of repeat prescription: Secondary | ICD-10-CM | POA: Insufficient documentation

## 2012-01-21 DIAGNOSIS — M129 Arthropathy, unspecified: Secondary | ICD-10-CM | POA: Insufficient documentation

## 2012-01-21 DIAGNOSIS — I10 Essential (primary) hypertension: Secondary | ICD-10-CM | POA: Insufficient documentation

## 2012-01-21 DIAGNOSIS — E119 Type 2 diabetes mellitus without complications: Secondary | ICD-10-CM | POA: Insufficient documentation

## 2012-01-21 LAB — CBC
HCT: 28.1 % — ABNORMAL LOW (ref 39.0–52.0)
MCH: 30.8 pg (ref 26.0–34.0)
MCV: 92.1 fL (ref 78.0–100.0)
Platelets: 70 10*3/uL — ABNORMAL LOW (ref 150–400)
RBC: 3.05 MIL/uL — ABNORMAL LOW (ref 4.22–5.81)

## 2012-01-21 LAB — BASIC METABOLIC PANEL
BUN: 9 mg/dL (ref 6–23)
CO2: 21 mEq/L (ref 19–32)
Calcium: 8.7 mg/dL (ref 8.4–10.5)
Creatinine, Ser: 0.83 mg/dL (ref 0.50–1.35)

## 2012-01-21 LAB — GLUCOSE, CAPILLARY: Glucose-Capillary: 303 mg/dL — ABNORMAL HIGH (ref 70–99)

## 2012-01-21 MED ORDER — INSULIN GLARGINE 100 UNIT/ML ~~LOC~~ SOLN
20.0000 [IU] | Freq: Once | SUBCUTANEOUS | Status: AC
  Administered 2012-01-21: 04:00:00 via SUBCUTANEOUS
  Filled 2012-01-21: qty 20

## 2012-01-21 MED ORDER — ALPRAZOLAM 0.5 MG PO TABS
1.0000 mg | ORAL_TABLET | Freq: Once | ORAL | Status: AC
  Administered 2012-01-21: 1 mg via ORAL
  Filled 2012-01-21: qty 2

## 2012-01-21 MED ORDER — LISINOPRIL 10 MG PO TABS: 10.0000 mg | ORAL_TABLET | Freq: Every day | ORAL | Status: DC

## 2012-01-21 MED ORDER — OXYCODONE-ACETAMINOPHEN 5-325 MG PO TABS
1.0000 | ORAL_TABLET | Freq: Once | ORAL | Status: AC
  Administered 2012-01-21: 1 via ORAL
  Filled 2012-01-21: qty 1

## 2012-01-21 MED ORDER — INSULIN GLARGINE 100 UNIT/ML ~~LOC~~ SOLN: 30.0000 [IU] | Freq: Every day | SUBCUTANEOUS | Status: DC

## 2012-01-21 NOTE — ED Notes (Signed)
Bed:RESA<BR> Expected date:<BR> Expected time:<BR> Means of arrival:<BR> Comments:<BR>

## 2012-01-21 NOTE — ED Notes (Signed)
Pt present to ed room A with c/o generalized body aches.  Pt been off meds approx 3 days.  Hx bipolar.  Per ems pt found shaking and anxious.  Pt aaox3.

## 2012-01-21 NOTE — ED Notes (Signed)
PA at bedside.

## 2012-01-21 NOTE — ED Notes (Signed)
Pt cbg via ems 271, vitals:  180/88 hr 120 22 resp.
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
## 2012-01-23 MED FILL — Insulin Glargine Inj 100 Unit/ML: SUBCUTANEOUS | Qty: 0.2 | Status: AC

## 2012-01-25 NOTE — ED Provider Notes (Signed)
Medical screening examination/treatment/procedure(s) were performed by non-physician practitioner and as supervising physician I was immediately available for consultation/collaboration.   Sunnie Nielsen, MD 01/25/12 [phone]

## 2012-02-09 ENCOUNTER — Emergency Department (HOSPITAL_COMMUNITY)
Admission: EM | Admit: 2012-02-09 | Discharge: 2012-02-10 | Disposition: A | Discharge status: Other Acute Inpatient Hospital | Payer: Medicare PPO | Attending: Emergency Medicine | Admitting: Emergency Medicine

## 2012-02-09 ENCOUNTER — Encounter (HOSPITAL_COMMUNITY): Payer: Self-pay | Admitting: *Deleted

## 2012-02-09 ENCOUNTER — Emergency Department (HOSPITAL_COMMUNITY): Payer: Medicare PPO

## 2012-02-09 DIAGNOSIS — R609 Edema, unspecified: Secondary | ICD-10-CM | POA: Insufficient documentation

## 2012-02-09 DIAGNOSIS — Z794 Long term (current) use of insulin: Secondary | ICD-10-CM | POA: Insufficient documentation

## 2012-02-09 DIAGNOSIS — E119 Type 2 diabetes mellitus without complications: Secondary | ICD-10-CM | POA: Insufficient documentation

## 2012-02-09 DIAGNOSIS — R059 Cough, unspecified: Secondary | ICD-10-CM | POA: Insufficient documentation

## 2012-02-09 DIAGNOSIS — R05 Cough: Secondary | ICD-10-CM | POA: Insufficient documentation

## 2012-02-09 DIAGNOSIS — R0602 Shortness of breath: Secondary | ICD-10-CM | POA: Insufficient documentation

## 2012-02-09 DIAGNOSIS — F329 Major depressive disorder, single episode, unspecified: Secondary | ICD-10-CM

## 2012-02-09 DIAGNOSIS — R739 Hyperglycemia, unspecified: Secondary | ICD-10-CM

## 2012-02-09 DIAGNOSIS — F3289 Other specified depressive episodes: Secondary | ICD-10-CM | POA: Insufficient documentation

## 2012-02-09 DIAGNOSIS — F1910 Other psychoactive substance abuse, uncomplicated: Secondary | ICD-10-CM | POA: Insufficient documentation

## 2012-02-09 DIAGNOSIS — I10 Essential (primary) hypertension: Secondary | ICD-10-CM | POA: Insufficient documentation

## 2012-02-09 LAB — CBC
MCH: 29.1 pg (ref 26.0–34.0)
MCHC: 32.6 g/dL (ref 30.0–36.0)
Platelets: 75 10*3/uL — ABNORMAL LOW (ref 150–400)
RDW: 16.1 % — ABNORMAL HIGH (ref 11.5–15.5)

## 2012-02-09 LAB — COMPREHENSIVE METABOLIC PANEL
ALT: 26 U/L (ref 0–53)
AST: 63 U/L — ABNORMAL HIGH (ref 0–37)
CO2: 21 mEq/L (ref 19–32)
Calcium: 8.7 mg/dL (ref 8.4–10.5)
Creatinine, Ser: 0.71 mg/dL (ref 0.50–1.35)
GFR calc non Af Amer: >90 mL/min (ref >90)
Sodium: 135 mEq/L (ref 135–145)
Total Protein: 7 g/dL (ref 6.0–8.3)

## 2012-02-09 LAB — URINALYSIS, ROUTINE W REFLEX MICROSCOPIC
Bilirubin Urine: NEGATIVE
Glucose, UA: >1000 mg/dL — AB
Hgb urine dipstick: NEGATIVE
Specific Gravity, Urine: 1.021 (ref 1.005–1.030)
Urobilinogen, UA: 1 mg/dL (ref 0.0–1.0)
pH: 5.5 (ref 5.0–8.0)

## 2012-02-09 LAB — RAPID URINE DRUG SCREEN, HOSP PERFORMED
Amphetamines: NOT DETECTED
Barbiturates: NOT DETECTED
Benzodiazepines: POSITIVE — AB
Cocaine: POSITIVE — AB
Opiates: NOT DETECTED
Tetrahydrocannabinol: NOT DETECTED

## 2012-02-09 LAB — URINE MICROSCOPIC-ADD ON

## 2012-02-09 LAB — ETHANOL: Alcohol, Ethyl (B): 108 mg/dL — ABNORMAL HIGH (ref 0–11)

## 2012-02-09 MED ORDER — ALUM & MAG HYDROXIDE-SIMETH 200-200-20 MG/5ML PO SUSP: 30.0000 mL | ORAL | Status: DC | PRN

## 2012-02-09 MED ORDER — FUROSEMIDE 20 MG PO TABS
40.0000 mg | ORAL_TABLET | Freq: Every day | ORAL | Status: DC
  Administered 2012-02-09: 40 mg via ORAL
  Filled 2012-02-09: qty 2

## 2012-02-09 MED ORDER — LORAZEPAM 1 MG PO TABS: 0.0000 mg | ORAL_TABLET | Freq: Two times a day (BID) | ORAL | Status: DC

## 2012-02-09 MED ORDER — IBUPROFEN 200 MG PO TABS: 600.0000 mg | ORAL_TABLET | Freq: Three times a day (TID) | ORAL | Status: DC | PRN

## 2012-02-09 MED ORDER — ZOLPIDEM TARTRATE 5 MG PO TABS: 5.0000 mg | ORAL_TABLET | Freq: Every evening | ORAL | Status: DC | PRN

## 2012-02-09 MED ORDER — LORAZEPAM 1 MG PO TABS
1.0000 mg | ORAL_TABLET | Freq: Four times a day (QID) | ORAL | Status: DC | PRN
  Administered 2012-02-10: 1 mg via ORAL
  Filled 2012-02-09: qty 1

## 2012-02-09 MED ORDER — VITAMIN B-1 100 MG PO TABS
100.0000 mg | ORAL_TABLET | Freq: Every day | ORAL | Status: DC
  Administered 2012-02-09: 100 mg via ORAL
  Filled 2012-02-09: qty 1

## 2012-02-09 MED ORDER — LORAZEPAM 2 MG/ML IJ SOLN: 1.0000 mg | Freq: Four times a day (QID) | INTRAMUSCULAR | Status: DC | PRN

## 2012-02-09 MED ORDER — ADULT MULTIVITAMIN W/MINERALS CH
1.0000 | ORAL_TABLET | Freq: Every day | ORAL | Status: DC
  Administered 2012-02-09: 1 via ORAL
  Filled 2012-02-09: qty 1

## 2012-02-09 MED ORDER — LISINOPRIL 10 MG PO TABS
10.0000 mg | ORAL_TABLET | Freq: Every day | ORAL | Status: DC
  Administered 2012-02-09: 10 mg via ORAL
  Filled 2012-02-09: qty 1

## 2012-02-09 MED ORDER — LORAZEPAM 1 MG PO TABS
0.0000 mg | ORAL_TABLET | Freq: Four times a day (QID) | ORAL | Status: DC
  Administered 2012-02-09: 2 mg via ORAL
  Administered 2012-02-10: 1 mg via ORAL
  Filled 2012-02-09: qty 2
  Filled 2012-02-09 (×2): qty 1

## 2012-02-09 MED ORDER — DIAZEPAM 5 MG PO TABS
5.0000 mg | ORAL_TABLET | Freq: Once | ORAL | Status: AC
  Administered 2012-02-09: 5 mg via ORAL
  Filled 2012-02-09: qty 1

## 2012-02-09 MED ORDER — FOLIC ACID 1 MG PO TABS
1.0000 mg | ORAL_TABLET | Freq: Every day | ORAL | Status: DC
  Administered 2012-02-09: 1 mg via ORAL
  Filled 2012-02-09: qty 1

## 2012-02-09 MED ORDER — NICOTINE 21 MG/24HR TD PT24
21.0000 mg | MEDICATED_PATCH | Freq: Every day | TRANSDERMAL | Status: DC
  Administered 2012-02-09: 21 mg via TRANSDERMAL
  Filled 2012-02-09: qty 1

## 2012-02-09 MED ORDER — ACETAMINOPHEN 325 MG PO TABS
650.0000 mg | ORAL_TABLET | Freq: Once | ORAL | Status: AC
  Administered 2012-02-09: 650 mg via ORAL
  Filled 2012-02-09: qty 2

## 2012-02-09 MED ORDER — ACETAMINOPHEN 325 MG PO TABS: 650.0000 mg | ORAL_TABLET | ORAL | Status: DC | PRN

## 2012-02-09 MED ORDER — THIAMINE HCL 100 MG/ML IJ SOLN: 100.0000 mg | Freq: Every day | INTRAMUSCULAR | Status: DC

## 2012-02-09 MED ORDER — ONDANSETRON HCL 8 MG PO TABS: 4.0000 mg | ORAL_TABLET | Freq: Three times a day (TID) | ORAL | Status: DC | PRN

## 2012-02-09 MED ORDER — GABAPENTIN 400 MG PO CAPS
400.0000 mg | ORAL_CAPSULE | ORAL | Status: DC
  Administered 2012-02-09: 400 mg via ORAL
  Filled 2012-02-09 (×3): qty 1

## 2012-02-09 MED ORDER — INSULIN ASPART 100 UNIT/ML ~~LOC~~ SOLN
10.0000 [IU] | Freq: Once | SUBCUTANEOUS | Status: AC
  Administered 2012-02-09: 10 [IU] via SUBCUTANEOUS
  Filled 2012-02-09: qty 1
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
## 2012-02-09 NOTE — ED Notes (Signed)
Pt has removed his clothing and placed in scrubs. Pt has been wanded by secuirty, meal given. Okay for patient to transfer to POD C per [place], [place]. Report given to [place], [place]
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
## 2012-02-09 NOTE — ED Provider Notes (Signed)
Medical screening examination/treatment/procedure(s) were performed by non-physician practitioner and as supervising physician I was immediately available for consultation/collaboration.  Cheri Guppy, MD 02/09/12 2033

## 2012-02-10 LAB — GLUCOSE, CAPILLARY: Glucose-Capillary: 132 mg/dL — ABNORMAL HIGH (ref 70–99)

## 2012-02-10 NOTE — ED Provider Notes (Signed)
PT accepted for detox at [facility] and evaluated at 6:57 AM has some chronic leg and back pains unchanged.  No new symptoms. On exam is neurologically appropriate without any SI/ HI or indication for IVC. PT is willing to go to [facility] and medically cleared to do so.   Results for orders placed during the hospital encounter of 02/09/12  URINALYSIS, ROUTINE W REFLEX MICROSCOPIC      Component Value Range   Color, Urine YELLOW  YELLOW   APPearance CLEAR  CLEAR   Specific Gravity, Urine 1.021  1.005 - 1.030   pH 5.5  5.0 - 8.0   Glucose, UA >1000 (*) NEGATIVE mg/dL   Hgb urine dipstick NEGATIVE  NEGATIVE   Bilirubin Urine NEGATIVE  NEGATIVE   Ketones, ur NEGATIVE  NEGATIVE mg/dL   Protein, ur NEGATIVE  NEGATIVE mg/dL   Urobilinogen, UA 1.0  0.0 - 1.0 mg/dL   Nitrite NEGATIVE  NEGATIVE   Leukocytes, UA NEGATIVE  NEGATIVE  URINE RAPID DRUG SCREEN (HOSP PERFORMED)      Component Value Range   Opiates NONE DETECTED  NONE DETECTED   Cocaine POSITIVE (*) NONE DETECTED   Benzodiazepines POSITIVE (*) NONE DETECTED   Amphetamines NONE DETECTED  NONE DETECTED   Tetrahydrocannabinol NONE DETECTED  NONE DETECTED   Barbiturates NONE DETECTED  NONE DETECTED  CBC      Component Value Range   WBC 3.1 (*) 4.0 - 10.5 K/uL   RBC 3.06 (*) 4.22 - 5.81 MIL/uL   Hemoglobin 8.9 (*) 13.0 - 17.0 g/dL   HCT 29.5 (*) 62.1 - 30.8 %   MCV 89.2  78.0 - 100.0 fL   MCH 29.1  26.0 - 34.0 pg   MCHC 32.6  30.0 - 36.0 g/dL   RDW 65.7 (*) 84.6 - 96.2 %   Platelets 75 (*) 150 - 400 K/uL  ETHANOL      Component Value Range   Alcohol, Ethyl (B) 108 (*) 0 - 11 mg/dL  COMPREHENSIVE METABOLIC PANEL      Component Value Range   Sodium 135  135 - 145 mEq/L   Potassium 4.2  3.5 - 5.1 mEq/L   Chloride 102  96 - 112 mEq/L   CO2 21  19 - 32 mEq/L   Glucose, Bld 302 (*) 70 - 99 mg/dL   BUN 8  6 - 23 mg/dL   Creatinine, Ser 9.52  0.50 - 1.35 mg/dL   Calcium 8.7  8.4 - 84.1 mg/dL   Total Protein 7.0  6.0 - 8.3  g/dL   Albumin 3.0 (*) 3.5 - 5.2 g/dL   AST 63 (*) 0 - 37 U/L   ALT 26  0 - 53 U/L   Alkaline Phosphatase 100  39 - 117 U/L   Total Bilirubin 0.6  0.3 - 1.2 mg/dL   GFR calc non Af Amer >90  >90 mL/min   GFR calc Af Amer >90  >90 mL/min  URINE MICROSCOPIC-ADD ON      Component Value Range   WBC, UA 0-2  <3 WBC/hpf   RBC / HPF 0-2  <3 RBC/hpf   Bacteria, UA RARE  RARE  GLUCOSE, CAPILLARY      Component Value Range   Glucose-Capillary 132 (*) 70 - 99 mg/dL   Comment 1 Notify RN     Dg Chest 2 View  02/09/2012  *RADIOLOGY REPORT*  Clinical Data: Cough and shortness of breath.  CHEST - 2 VIEW  Comparison: Two-view chest  04/13/2009.  Findings: The heart size is normal.  The lungs are clear.  The visualized soft tissues and bony thorax are unremarkable.  IMPRESSION: Negative chest.   Original Report Authenticated By: Jamesetta Orleans. MATTERN, M.D.       Sunnie Nielsen, MD 02/10/12 [phone]

## 2012-02-10 NOTE — BH Assessment (Signed)
[facility] Assessment Progress Note    Pt had been accepted to [facility] by Dr. Adonis Housekeeper.  Patient is voluntary and has declined offer of a bed for substance abuse at [facility].  This offer was made to patient three times and three times he said "I don't want to go over there, they don't help out."  It was explained to patient that there are no beds at [facility] or [facility].  Patient consistently talked about needing a vascular doctor and negated his SA issues.  At this time patient is not in need of ACT services.

## 2012-03-15 ENCOUNTER — Ambulatory Visit (INDEPENDENT_AMBULATORY_CARE_PROVIDER_SITE_OTHER): Payer: Medicare PPO | Admitting: Family Medicine

## 2012-03-15 VITALS — BP 131/54 | HR 92 | Temp 98.1°F | Resp 20 | Ht 69.75 in | Wt 229.0 lb

## 2012-03-15 DIAGNOSIS — F1920 Other psychoactive substance dependence, uncomplicated: Secondary | ICD-10-CM

## 2012-03-15 DIAGNOSIS — I872 Venous insufficiency (chronic) (peripheral): Secondary | ICD-10-CM

## 2012-03-15 DIAGNOSIS — E118 Type 2 diabetes mellitus with unspecified complications: Secondary | ICD-10-CM

## 2012-03-15 DIAGNOSIS — F1010 Alcohol abuse, uncomplicated: Secondary | ICD-10-CM

## 2012-03-15 DIAGNOSIS — F319 Bipolar disorder, unspecified: Secondary | ICD-10-CM

## 2012-03-15 DIAGNOSIS — G589 Mononeuropathy, unspecified: Secondary | ICD-10-CM

## 2012-03-15 DIAGNOSIS — G629 Polyneuropathy, unspecified: Secondary | ICD-10-CM

## 2012-03-15 NOTE — Patient Instructions (Signed)
You need to establish psychatric care immediately.  It can take some time for a referral so I recommend that tomorrow you go to the [facility] into their walk-in crisis clinic.  This will likely be the quickest way to establish a psychiatrist for medical treatment
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
## 2012-03-17 ENCOUNTER — Encounter (HOSPITAL_COMMUNITY): Payer: Self-pay | Admitting: Emergency Medicine

## 2012-03-17 ENCOUNTER — Emergency Department (HOSPITAL_COMMUNITY)
Admission: EM | Admit: 2012-03-17 | Discharge: 2012-03-18 | Disposition: A | Discharge status: Home / Self Care | Payer: Medicare PPO | Attending: Emergency Medicine | Admitting: Emergency Medicine

## 2012-03-17 DIAGNOSIS — I10 Essential (primary) hypertension: Secondary | ICD-10-CM | POA: Insufficient documentation

## 2012-03-17 DIAGNOSIS — R112 Nausea with vomiting, unspecified: Secondary | ICD-10-CM | POA: Insufficient documentation

## 2012-03-17 DIAGNOSIS — F3289 Other specified depressive episodes: Secondary | ICD-10-CM | POA: Insufficient documentation

## 2012-03-17 DIAGNOSIS — Z8669 Personal history of other diseases of the nervous system and sense organs: Secondary | ICD-10-CM | POA: Insufficient documentation

## 2012-03-17 DIAGNOSIS — Z7982 Long term (current) use of aspirin: Secondary | ICD-10-CM | POA: Insufficient documentation

## 2012-03-17 DIAGNOSIS — F1010 Alcohol abuse, uncomplicated: Secondary | ICD-10-CM | POA: Insufficient documentation

## 2012-03-17 DIAGNOSIS — F17200 Nicotine dependence, unspecified, uncomplicated: Secondary | ICD-10-CM | POA: Insufficient documentation

## 2012-03-17 DIAGNOSIS — Z8679 Personal history of other diseases of the circulatory system: Secondary | ICD-10-CM | POA: Insufficient documentation

## 2012-03-17 DIAGNOSIS — Z794 Long term (current) use of insulin: Secondary | ICD-10-CM | POA: Insufficient documentation

## 2012-03-17 DIAGNOSIS — Z79899 Other long term (current) drug therapy: Secondary | ICD-10-CM | POA: Insufficient documentation

## 2012-03-17 DIAGNOSIS — Z8739 Personal history of other diseases of the musculoskeletal system and connective tissue: Secondary | ICD-10-CM | POA: Insufficient documentation

## 2012-03-17 DIAGNOSIS — E119 Type 2 diabetes mellitus without complications: Secondary | ICD-10-CM | POA: Insufficient documentation

## 2012-03-17 DIAGNOSIS — F329 Major depressive disorder, single episode, unspecified: Secondary | ICD-10-CM | POA: Insufficient documentation

## 2012-03-17 LAB — CBC WITH DIFFERENTIAL/PLATELET
Basophils Absolute: 0 10*3/uL (ref 0.0–0.1)
Lymphocytes Relative: 28 % (ref 12–46)
Lymphs Abs: 1.3 10*3/uL (ref 0.7–4.0)
Neutro Abs: 2.8 10*3/uL (ref 1.7–7.7)
Platelets: 86 10*3/uL — ABNORMAL LOW (ref 150–400)
RBC: 3.88 MIL/uL — ABNORMAL LOW (ref 4.22–5.81)
RDW: 17.1 % — ABNORMAL HIGH (ref 11.5–15.5)
WBC: 4.7 10*3/uL (ref 4.0–10.5)

## 2012-03-17 LAB — BLOOD GAS, VENOUS
Acid-base deficit: 3.4 mmol/L — ABNORMAL HIGH (ref 0.0–2.0)
Bicarbonate: 20.1 mEq/L (ref 20.0–24.0)
Patient temperature: 98.6
TCO2: 18.5 mmol/L (ref 0–100)
pH, Ven: 7.401 — ABNORMAL HIGH (ref 7.250–7.300)

## 2012-03-17 LAB — COMPREHENSIVE METABOLIC PANEL
ALT: 24 U/L (ref 0–53)
AST: 46 U/L — ABNORMAL HIGH (ref 0–37)
Alkaline Phosphatase: 96 U/L (ref 39–117)
CO2: 21 mEq/L (ref 19–32)
Calcium: 9.6 mg/dL (ref 8.4–10.5)
Chloride: 98 mEq/L (ref 96–112)
GFR calc non Af Amer: >90 mL/min (ref >90)
Potassium: 4.4 mEq/L (ref 3.5–5.1)
Sodium: 132 mEq/L — ABNORMAL LOW (ref 135–145)
Total Bilirubin: 1.3 mg/dL — ABNORMAL HIGH (ref 0.3–1.2)

## 2012-03-17 LAB — GLUCOSE, CAPILLARY: Glucose-Capillary: 312 mg/dL — ABNORMAL HIGH (ref 70–99)

## 2012-03-17 MED ORDER — ONDANSETRON HCL 4 MG/2ML IJ SOLN
4.0000 mg | Freq: Once | INTRAMUSCULAR | Status: AC
  Administered 2012-03-17: 4 mg via INTRAVENOUS
  Filled 2012-03-17: qty 2

## 2012-03-17 MED ORDER — SODIUM CHLORIDE 0.9 % IV BOLUS (SEPSIS)
1000.0000 mL | Freq: Once | INTRAVENOUS | Status: AC
  Administered 2012-03-17: 1000 mL via INTRAVENOUS

## 2012-03-17 MED ORDER — ALBUTEROL SULFATE HFA 108 (90 BASE) MCG/ACT IN AERS
2.0000 | INHALATION_SPRAY | RESPIRATORY_TRACT | Status: DC | PRN
  Administered 2012-03-17: 2 via RESPIRATORY_TRACT
  Filled 2012-03-17: qty 6.7

## 2012-03-17 NOTE — ED Notes (Signed)
Tiffany PA notified. Still waiting for telepsych. Following up with ACT team to find out status.
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
## 2012-03-17 NOTE — ED Notes (Addendum)
Spoke with ACT team. Telepsych needs to be ordered by the MD. MD notified. Pt notified that he is still pending a telepsych evaluation.

## 2012-03-17 NOTE — ED Notes (Signed)
RUE:[id number]<JW> Expected date:03/17/12<BR> Expected time: 3:45 PM<BR> Means of arrival:Ambulance<BR> Comments:<BR> Back Spasms

## 2012-03-17 NOTE — ED Notes (Signed)
Bedside report received from previous RN 
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
## 2012-03-17 NOTE — ED Notes (Signed)
Pt states he is feeling shaky and wants detox. Notifying MD.

## 2012-03-18 MED ORDER — DIAZEPAM 5 MG PO TABS: 10.0000 mg | ORAL_TABLET | Freq: Three times a day (TID) | ORAL | Status: DC | PRN

## 2012-03-18 MED ORDER — LORAZEPAM 1 MG PO TABS
2.0000 mg | ORAL_TABLET | Freq: Once | ORAL | Status: AC
  Administered 2012-03-18: 2 mg via ORAL
  Filled 2012-03-18: qty 2

## 2012-03-18 NOTE — ED Provider Notes (Signed)
Medical screening examination/treatment/procedure(s) were performed by non-physician practitioner and as supervising physician I was immediately available for consultation/collaboration.  Thy Gullikson, MD 03/18/12 1522 

## 2012-03-18 NOTE — ED Notes (Signed)
Pt complaining of chest pain. Nurse notified and ekg done

## 2012-03-18 NOTE — ED Notes (Signed)
Followed up with [facility] Call about telepsych. Was informed that Dr. Henderson Cloud had just received info on the pt and should be calling within the next 1/2 hour.

## 2012-03-18 NOTE — ED Provider Notes (Signed)
Pt does not meet criteria for inpatient treatment as he has only been drinking for 4 days per patient report, provider he originally saw and ACT team member. Telepsych recommended inpatient treatment and diazepam 10mg  q 6 hr prn to Rx. Pt will get resources and set up for outpatient intense therapy. I have discussed with ACT who says that she will help me set that up. Pt okay with plan. No SI/HI.  Pt has been advised of the symptoms that warrant their return to the ED. Patient has voiced understanding and has agreed to follow-up with the PCP or specialist.   Dorthula Matas, PA 03/18/12 0549  Dorthula Matas, PA 03/18/12 [phone]  After being discharged pt wanted some Ativan before he left so that he has time to get his Valium filled to prevent withdrawal. Will give 2mg  Ativan PO and then re-discharge.  Dorthula Matas, PA 03/18/12 [phone]

## 2012-03-19 NOTE — ED Provider Notes (Signed)
Medical screening examination/treatment/procedure(s) were performed by non-physician practitioner and as supervising physician I was immediately available for consultation/collaboration.  Breeley Bischof, MD 03/19/12 0135 

## 2012-03-28 ENCOUNTER — Emergency Department (HOSPITAL_COMMUNITY)
Admission: EM | Admit: 2012-03-28 | Discharge: 2012-03-29 | Disposition: A | Discharge status: Home / Self Care | Payer: Medicare PPO | Attending: Emergency Medicine | Admitting: Emergency Medicine

## 2012-03-28 ENCOUNTER — Encounter (HOSPITAL_COMMUNITY): Payer: Self-pay | Admitting: Emergency Medicine

## 2012-03-28 DIAGNOSIS — Z794 Long term (current) use of insulin: Secondary | ICD-10-CM | POA: Insufficient documentation

## 2012-03-28 DIAGNOSIS — E119 Type 2 diabetes mellitus without complications: Secondary | ICD-10-CM | POA: Insufficient documentation

## 2012-03-28 DIAGNOSIS — Z791 Long term (current) use of non-steroidal anti-inflammatories (NSAID): Secondary | ICD-10-CM | POA: Insufficient documentation

## 2012-03-28 DIAGNOSIS — M129 Arthropathy, unspecified: Secondary | ICD-10-CM | POA: Insufficient documentation

## 2012-03-28 DIAGNOSIS — F1010 Alcohol abuse, uncomplicated: Secondary | ICD-10-CM

## 2012-03-28 DIAGNOSIS — G589 Mononeuropathy, unspecified: Secondary | ICD-10-CM | POA: Insufficient documentation

## 2012-03-28 DIAGNOSIS — F10239 Alcohol dependence with withdrawal, unspecified: Secondary | ICD-10-CM

## 2012-03-28 DIAGNOSIS — Z8673 Personal history of transient ischemic attack (TIA), and cerebral infarction without residual deficits: Secondary | ICD-10-CM | POA: Insufficient documentation

## 2012-03-28 DIAGNOSIS — Z9119 Patient's noncompliance with other medical treatment and regimen: Secondary | ICD-10-CM

## 2012-03-28 DIAGNOSIS — F319 Bipolar disorder, unspecified: Secondary | ICD-10-CM | POA: Insufficient documentation

## 2012-03-28 DIAGNOSIS — F1910 Other psychoactive substance abuse, uncomplicated: Secondary | ICD-10-CM

## 2012-03-28 DIAGNOSIS — F1410 Cocaine abuse, uncomplicated: Secondary | ICD-10-CM

## 2012-03-28 DIAGNOSIS — I10 Essential (primary) hypertension: Secondary | ICD-10-CM | POA: Insufficient documentation

## 2012-03-28 DIAGNOSIS — F1020 Alcohol dependence, uncomplicated: Secondary | ICD-10-CM | POA: Insufficient documentation

## 2012-03-28 DIAGNOSIS — F17200 Nicotine dependence, unspecified, uncomplicated: Secondary | ICD-10-CM | POA: Insufficient documentation

## 2012-03-28 DIAGNOSIS — Z7982 Long term (current) use of aspirin: Secondary | ICD-10-CM | POA: Insufficient documentation

## 2012-03-28 LAB — GLUCOSE, CAPILLARY
Glucose-Capillary: 365 mg/dL — ABNORMAL HIGH (ref 70–99)
Glucose-Capillary: 287 mg/dL — ABNORMAL HIGH (ref 70–99)
Glucose-Capillary: 230 mg/dL — ABNORMAL HIGH (ref 70–99)
Glucose-Capillary: 256 mg/dL — ABNORMAL HIGH (ref 70–99)
Glucose-Capillary: 224 mg/dL — ABNORMAL HIGH (ref 70–99)

## 2012-03-28 LAB — COMPREHENSIVE METABOLIC PANEL
AST: 45 U/L — ABNORMAL HIGH (ref 0–37)
Albumin: 3.1 g/dL — ABNORMAL LOW (ref 3.5–5.2)
Alkaline Phosphatase: 89 U/L (ref 39–117)
BUN: 8 mg/dL (ref 6–23)
CO2: 22 mEq/L (ref 19–32)
Chloride: 102 mEq/L (ref 96–112)
Creatinine, Ser: 0.66 mg/dL (ref 0.50–1.35)
GFR calc non Af Amer: >90 mL/min (ref >90)
Potassium: 3.9 mEq/L (ref 3.5–5.1)
Total Bilirubin: 0.8 mg/dL (ref 0.3–1.2)

## 2012-03-28 LAB — CBC
HCT: 29 % — ABNORMAL LOW (ref 39.0–52.0)
MCV: 84.5 fL (ref 78.0–100.0)
RBC: 3.43 MIL/uL — ABNORMAL LOW (ref 4.22–5.81)
RDW: 17.5 % — ABNORMAL HIGH (ref 11.5–15.5)
WBC: 3.1 10*3/uL — ABNORMAL LOW (ref 4.0–10.5)

## 2012-03-28 LAB — RAPID URINE DRUG SCREEN, HOSP PERFORMED
Amphetamines: NOT DETECTED
Opiates: NOT DETECTED
Tetrahydrocannabinol: NOT DETECTED

## 2012-03-28 LAB — ETHANOL: Alcohol, Ethyl (B): 119 mg/dL — ABNORMAL HIGH (ref 0–11)

## 2012-03-28 MED ORDER — LISINOPRIL 10 MG PO TABS
10.0000 mg | ORAL_TABLET | Freq: Every day | ORAL | Status: DC
  Administered 2012-03-28 – 2012-03-29 (×2): 10 mg via ORAL
  Filled 2012-03-28 (×2): qty 1

## 2012-03-28 MED ORDER — ACETAMINOPHEN 325 MG PO TABS: 650.0000 mg | ORAL_TABLET | ORAL | Status: DC | PRN

## 2012-03-28 MED ORDER — LORAZEPAM 1 MG PO TABS
1.0000 mg | ORAL_TABLET | Freq: Four times a day (QID) | ORAL | Status: DC | PRN
  Administered 2012-03-28: 2 mg via ORAL
  Filled 2012-03-28: qty 2

## 2012-03-28 MED ORDER — ONDANSETRON HCL 4 MG PO TABS: 4.0000 mg | ORAL_TABLET | Freq: Three times a day (TID) | ORAL | Status: DC | PRN

## 2012-03-28 MED ORDER — LOPERAMIDE HCL 2 MG PO CAPS: 2.0000 mg | ORAL_CAPSULE | ORAL | Status: DC | PRN

## 2012-03-28 MED ORDER — VITAMIN B-1 100 MG PO TABS
100.0000 mg | ORAL_TABLET | Freq: Every day | ORAL | Status: DC
  Administered 2012-03-28 – 2012-03-29 (×2): 100 mg via ORAL
  Filled 2012-03-28 (×2): qty 1

## 2012-03-28 MED ORDER — SODIUM CHLORIDE 0.9 % IV BOLUS (SEPSIS)
1000.0000 mL | Freq: Once | INTRAVENOUS | Status: AC
  Administered 2012-03-28: 1000 mL via INTRAVENOUS

## 2012-03-28 MED ORDER — IBUPROFEN 200 MG PO TABS
600.0000 mg | ORAL_TABLET | Freq: Three times a day (TID) | ORAL | Status: DC | PRN
  Administered 2012-03-28 (×2): 600 mg via ORAL
  Filled 2012-03-28 (×2): qty 1

## 2012-03-28 MED ORDER — THIAMINE HCL 100 MG/ML IJ SOLN: 100.0000 mg | Freq: Every day | INTRAMUSCULAR | Status: DC

## 2012-03-28 MED ORDER — THIAMINE HCL 100 MG/ML IJ SOLN: 100.0000 mg | Freq: Once | INTRAMUSCULAR | Status: DC

## 2012-03-28 MED ORDER — ALUM & MAG HYDROXIDE-SIMETH 200-200-20 MG/5ML PO SUSP: 30.0000 mL | ORAL | Status: DC | PRN

## 2012-03-28 MED ORDER — CHLORDIAZEPOXIDE HCL 25 MG PO CAPS
25.0000 mg | ORAL_CAPSULE | Freq: Four times a day (QID) | ORAL | Status: DC | PRN
  Administered 2012-03-28 – 2012-03-29 (×2): 25 mg via ORAL
  Filled 2012-03-28 (×2): qty 1

## 2012-03-28 MED ORDER — INSULIN GLARGINE 100 UNIT/ML ~~LOC~~ SOLN
30.0000 [IU] | Freq: Every day | SUBCUTANEOUS | Status: DC
  Administered 2012-03-28: 30 [IU] via SUBCUTANEOUS
  Filled 2012-03-28: qty 1

## 2012-03-28 MED ORDER — ADULT MULTIVITAMIN W/MINERALS CH
1.0000 | ORAL_TABLET | Freq: Every day | ORAL | Status: DC
  Administered 2012-03-28 – 2012-03-29 (×2): 1 via ORAL
  Filled 2012-03-28 (×2): qty 1

## 2012-03-28 MED ORDER — ASPIRIN EC 81 MG PO TBEC
81.0000 mg | DELAYED_RELEASE_TABLET | Freq: Every day | ORAL | Status: DC
  Administered 2012-03-28 – 2012-03-29 (×2): 81 mg via ORAL
  Filled 2012-03-28 (×2): qty 1

## 2012-03-28 MED ORDER — ZOLPIDEM TARTRATE 5 MG PO TABS
5.0000 mg | ORAL_TABLET | Freq: Every evening | ORAL | Status: DC | PRN
  Administered 2012-03-28: 5 mg via ORAL
  Filled 2012-03-28: qty 1

## 2012-03-28 MED ORDER — LORAZEPAM 2 MG/ML IJ SOLN: 1.0000 mg | Freq: Four times a day (QID) | INTRAMUSCULAR | Status: DC | PRN

## 2012-03-28 MED ORDER — GABAPENTIN 400 MG PO CAPS
400.0000 mg | ORAL_CAPSULE | Freq: Three times a day (TID) | ORAL | Status: DC
Start: 2012-03-28 — End: 2012-03-29
  Administered 2012-03-28 – 2012-03-29 (×4): 400 mg via ORAL
  Filled 2012-03-28 (×7): qty 1

## 2012-03-28 MED ORDER — ONDANSETRON 4 MG PO TBDP: 4.0000 mg | ORAL_TABLET | Freq: Four times a day (QID) | ORAL | Status: DC | PRN

## 2012-03-28 MED ORDER — FOLIC ACID 1 MG PO TABS
1.0000 mg | ORAL_TABLET | Freq: Every day | ORAL | Status: DC
  Administered 2012-03-28 – 2012-03-29 (×2): 1 mg via ORAL
  Filled 2012-03-28 (×2): qty 1

## 2012-03-28 MED ORDER — NICOTINE 21 MG/24HR TD PT24
21.0000 mg | MEDICATED_PATCH | Freq: Every day | TRANSDERMAL | Status: DC
  Administered 2012-03-28 – 2012-03-29 (×2): 21 mg via TRANSDERMAL
  Filled 2012-03-28 (×2): qty 1

## 2012-03-28 MED ORDER — FUROSEMIDE 20 MG PO TABS
20.0000 mg | ORAL_TABLET | Freq: Every day | ORAL | Status: DC
  Administered 2012-03-28 – 2012-03-29 (×2): 20 mg via ORAL
  Filled 2012-03-28 (×2): qty 1

## 2012-03-28 NOTE — ED Notes (Signed)
Patient requests sleep aid; Ambien given as ordered.

## 2012-03-28 NOTE — ED Notes (Signed)
Patient arrived to unit; no s/s of distress noted. Patient pleasant and cooperative. Pt denies SI or plans to harm himself. Pt states he only wants to go through detox and wants a change.

## 2012-03-28 NOTE — ED Notes (Signed)
ZOX:[id number]<AV> Expected date:03/28/12<BR> Expected time: 2:36 AM<BR> Means of arrival:Ambulance<BR> Comments:<BR> Detox; medical clearance

## 2012-03-28 NOTE — ED Notes (Signed)
Per EMS. Patient called in "legs and feet, drunk", upon arrival patient stated he wanted to detox, he is tired of being drunk. Denied SI/HI. Patient has hx bipolar, untreated.
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
## 2012-03-28 NOTE — Progress Notes (Addendum)
Pharmacy - Medication Reconciliation   Tiffany, per your request, we have contacted patient's pharmacies & MD office and obtained the following information regarding Xanax, Valium and Percocet on patient's PTA list.  Per [facility], patient has prescriptions for .. 1. Hydrocodone/APAP 10/500 mg 1 tab q6prn (from Dr. Dareen Piano in [facility]. Last filled 02/21/2012, #60) 2. Hydrocodone/APAP 10/325 mg 1 tab q6prn (from Dr. Dareen Piano in [facility].  Last filled 01/15/12) 3. Xanax 1mg  TID as needed (from Dr. Dareen Piano. Last filled 02/14/11) 4. Diazepam 10mg  q8prn (from [name].  Last filled 11/2010 - should not be on this medication after starting Xanax)  Per [facility], patient has prescriptions for .Marland Kitchen 1. Xanax 1mg  TID as need (from Dr. Tilden Fossa, last filled 03/01/12) 2. Percocet 5/325 - 1 tab q6prn (from Dr. Tilden Fossa, last filled 03/01/12)   Per Dr. Maxwell Caul office, pt is no longer in their facility. Pt was dropped from office past summer due to polypharmacy.  No new prescriptions were generated.      The Valium that is currently active in patient's Med Rec was prescribed by Ellin Saba, PA on 03/18/12 #10.  Pharmacy will follow up.  Thanks  Geoffry Paradise, PharmD, BCPS Pager: [phone] 11:20 AM Pharmacy #: [phone]

## 2012-03-28 NOTE — ED Provider Notes (Signed)
History     CSN: [id number]  Arrival date & time 03/28/12  0255   First MD Initiated Contact with Patient 03/28/12 0309      Chief Complaint  Patient presents with  . Alcohol Intoxication    (Consider location/radiation/quality/duration/timing/severity/associated sxs/prior treatment) HPI Comments: Patient is a chronic alcoholic, has been drinking tonight.  His last drink was approximately 2 hours before arrival.  He, states he is also run out of his medications and hasn't had any in 3-4 day.  He has an appointment with [facility], sometime this week. He is here tonight because he wants detox from alcohol.  He is tired of being drunk.  He is not suicidal nor homicidal  Patient is a 56 y.o. male presenting with intoxication. The history is provided by the patient.  Alcohol Intoxication This is a chronic problem. Associated symptoms include arthralgias. Pertinent negatives include no abdominal pain, anorexia, change in bowel habit, fatigue, headaches, joint swelling, nausea, numbness, vomiting or weakness.    Past Medical History  Diagnosis Date  . Neuropathy   . Diabetes mellitus   . Bipolar affect, depressed   . Hypertension   . Stroke     Mini stroke about 1yrs ago  . Arthritis     Past Surgical History  Procedure Date  . Fracture surgery     Leg and arm 3yrs ago    Family History  Problem Relation Age of Onset  . Hypotension Mother     History  Substance Use Topics  . Smoking status: Current Every Day Smoker -- 0.5 packs/day for 30 years    Types: Cigarettes  . Smokeless tobacco: Never Used  . Alcohol Use: 51.6 oz/week    86 Cans of beer per week     Comment: heavy (approx 12/day)      Review of Systems  Constitutional: Negative for appetite change and fatigue.  Gastrointestinal: Negative for nausea, vomiting, abdominal pain, diarrhea, anorexia and change in bowel habit.  Genitourinary: Negative for dysuria.  Musculoskeletal: Positive for arthralgias.  Negative for back pain and joint swelling.  Skin: Negative for wound.  Neurological: Negative for dizziness, weakness, numbness and headaches.  Psychiatric/Behavioral: Negative for suicidal ideas, hallucinations, behavioral problems, confusion and decreased concentration. The patient is nervous/anxious.     Allergies  Review of patient's allergies indicates no known allergies.  Home Medications   Current Outpatient Rx  Name  Route  Sig  Dispense  Refill  . ALPRAZOLAM 2 MG PO TABS   Oral   Take 2 mg by mouth 3 (three) times daily as needed. For anxiety         . ASPIRIN EC 81 MG PO TBEC   Oral   Take 81 mg by mouth daily.           Marland Kitchen DIAZEPAM 10 MG PO TABS   Oral   Take 10 mg by mouth every 6 (six) hours as needed. For anxiety         . DIAZEPAM 5 MG PO TABS   Oral   Take 2 tablets (10 mg total) by mouth every 8 (eight) hours as needed for anxiety.   10 tablet   0   . FUROSEMIDE 20 MG PO TABS   Oral   Take 20 mg by mouth daily.         Marland Kitchen GABAPENTIN 400 MG PO CAPS   Oral   Take 400 mg by mouth 3 (three) times daily.         Marland Kitchen  INSULIN GLARGINE 100 UNIT/ML  SOLN   Subcutaneous   Inject 30 Units into the skin at bedtime.         Marland Kitchen LISINOPRIL 10 MG PO TABS   Oral   Take 1 tablet (10 mg total) by mouth daily.   30 tablet   0   . THERA M PLUS PO TABS   Oral   Take 1 tablet by mouth daily.   30 each   0   . OXYCODONE-ACETAMINOPHEN 5-325 MG PO TABS   Oral   Take 1 tablet by mouth every 6 (six) hours as needed. For pain           BP 153/73  Pulse 90  Temp 98.1 F (36.7 C) (Oral)  Resp 18  Ht 5\' 11"  (1.803 m)  Wt 243 lb (110.224 kg)  BMI 33.89 kg/m2  SpO2 97%  Physical Exam  Constitutional: He is oriented to person, place, and time. He appears well-developed and well-nourished.  HENT:  Head: Normocephalic.  Eyes: Pupils are equal, round, and reactive to light.  Neck: Normal range of motion.  Cardiovascular: Normal rate.     Pulmonary/Chest: Effort normal.  Abdominal: Soft. He exhibits no distension.  Musculoskeletal: Normal range of motion. He exhibits no edema and no tenderness.  Neurological: He is alert and oriented to person, place, and time.  Skin: Skin is warm.  Psychiatric: His speech is normal and behavior is normal. Judgment and thought content normal. Cognition and memory are normal. He exhibits a depressed mood.    ED Course  Procedures (including critical care time)  Labs Reviewed  GLUCOSE, CAPILLARY - Abnormal; Notable for the following:    Glucose-Capillary 365 (*)     All other components within normal limits  CBC  COMPREHENSIVE METABOLIC PANEL  ETHANOL  URINE RAPID DRUG SCREEN (HOSP PERFORMED)   No results found.   No diagnosis found.    MDM   Will allow patient to sober up than reassess for willingness to detox.  Will check labs and monitor blood sugars         Arman Filter, NP 03/28/12 [phone]

## 2012-03-28 NOTE — ED Notes (Signed)
Approx of NS bolus infused before patient d/c'd IV. Dondra Spry, NP notified. Requested to recheck CBG, and notify Dondra Spry the results.

## 2012-03-28 NOTE — ED Notes (Signed)
Patient asleep; no s/s of distress noted at this time. Respirations regular and unlabored. Q 15 minute safety checks maintained.

## 2012-03-28 NOTE — ED Provider Notes (Signed)
Pt is now sober and requesting detox. He admits to drug and alcohol abuse. He says he has 6 grandkids and wants to watch them grow up. He has been through "5 or 6" treatment programs but feels 5 day programs are not enough. He wants a [facility] 90 day program. Will consult ACT team for evaluation.  I asked pharmacy to reconcile patient medications as he has xanax, valium and percocet on his list per himself.  Dorthula Matas, PA 03/28/12 [phone]

## 2012-03-28 NOTE — [facility] Counselor (Signed)
Attempted to assess pt, but continually fell asleep while talking. Spoke with pt 10 minutes and got a few bits of information but pt continued to trail off words and fall asleep. Informed pt that I would return to assess him after he was able to remain more fully away and would talk.

## 2012-03-28 NOTE — ED Notes (Signed)
Patient questioned as to his desire to receive alcohol detox. Patient states he wants "to come off alcohol the right way". Patient advised he will be changed into blue scrubs, that himself and his belongings will be checked by security for his safety and the safety of the staff. Patient confirms understanding.

## 2012-03-28 NOTE — Consult Note (Signed)
Reason for Consult: alcohol withdrawal symptoms, alcohol dependence and intoxication, cocaine abuse, history of bipolar disorder. Noncompliance with medication Referring Physician: [name] is an 56 y.o. male.  HPI: Patient was seen and chart reviewed. Patient is known to the behavioral health from his previous multiple acute psychiatric hospitalization for alcohol intoxication and withdrawal symptoms. Patient reported he was relapsed without a specific triggers. He presented with the alcohol intoxication and reportedly drank 6 pack beers, and also smoked crack cocaineabout $10 worth. Patient , than called EMS for detox treatment.  Patient stated he has 6 grandchildren, he wants to watch them grow up. Patient has received the multiple detox treatment, which he he believes, not enough, and need long-term rehabilitation services. Patient is voluntary requesting treatment. Patient has a diabetic neuropathy,  hypertension and depression, but not taking medications over one year. [facility]. He owe money to them, so did not go back. Patient urine drug screen was positive for cocaine and blood alcohol level is 200 on arrival at 4 AM.  MSE: Patient was appeared lying down on his bed shaking, nervous, and anxious. Patient reported he has previous history of seizure related to alcohol withdrawal symptoms. Stated mood was depression, and anxious. Affect was appropriate. Patient has no suicidal, homicidal ideation, intentions, or plans. Patient has no evidence of psychotic symptoms.   Past Medical History  Diagnosis Date  . Neuropathy   . Diabetes mellitus   . Bipolar affect, depressed   . Hypertension   . Stroke     Mini stroke about 76yrs ago  . Arthritis     Past Surgical History  Procedure Date  . Fracture surgery     Leg and arm 65yrs ago    Family History  Problem Relation Age of Onset  . Hypotension Mother     Social  History:  reports that he has been smoking Cigarettes.  He has a 15 pack-year smoking history. He has never used smokeless tobacco. He reports that he drinks about 51.6 ounces of alcohol per week. He reports that he uses illicit drugs (Cocaine).  Allergies: No Known Allergies  Medications: I have reviewed the patient's current medications.  Results for orders placed during the hospital encounter of 03/28/12 (from the past 48 hour(s))  GLUCOSE, CAPILLARY     Status: Abnormal   Collection Time   03/28/12  3:02 AM      Component Value Range Comment   Glucose-Capillary 365 (*) 70 - 99 mg/dL   CBC     Status: Abnormal   Collection Time   03/28/12  4:00 AM      Component Value Range Comment   WBC 3.1 (*) 4.0 - 10.5 K/uL    RBC 3.43 (*) 4.22 - 5.81 MIL/uL    Hemoglobin 9.4 (*) 13.0 - 17.0 g/dL    HCT 16.1 (*) 09.6 - 52.0 %    MCV 84.5  78.0 - 100.0 fL    MCH 27.4  26.0 - 34.0 pg    MCHC 32.4  30.0 - 36.0 g/dL    RDW 04.5 (*) 40.9 - 15.5 %    Platelets 34 (*) 150 - 400 K/uL   COMPREHENSIVE METABOLIC PANEL     Status: Abnormal   Collection Time   03/28/12  4:00 AM      Component Value Range Comment   Sodium 133 (*) 135 - 145 mEq/L    Potassium 3.9  3.5 -  5.1 mEq/L    Chloride 102  96 - 112 mEq/L    CO2 22  19 - 32 mEq/L    Glucose, Bld 319 (*) 70 - 99 mg/dL    BUN 8  6 - 23 mg/dL    Creatinine, Ser 1.61  0.50 - 1.35 mg/dL    Calcium 8.1 (*) 8.4 - 10.5 mg/dL    Total Protein 7.0  6.0 - 8.3 g/dL    Albumin 3.1 (*) 3.5 - 5.2 g/dL    AST 45 (*) 0 - 37 U/L    ALT 23  0 - 53 U/L    Alkaline Phosphatase 89  39 - 117 U/L    Total Bilirubin 0.8  0.3 - 1.2 mg/dL    GFR calc non Af Amer >90  >90 mL/min    GFR calc Af Amer >90  >90 mL/min   ETHANOL     Status: Abnormal   Collection Time   03/28/12  4:00 AM      Component Value Range Comment   Alcohol, Ethyl (B) 200 (*) 0 - 11 mg/dL   URINE RAPID DRUG SCREEN (HOSP PERFORMED)     Status: Abnormal   Collection Time   03/28/12  4:34 AM       Component Value Range Comment   Opiates NONE DETECTED  NONE DETECTED    Cocaine POSITIVE (*) NONE DETECTED    Benzodiazepines NONE DETECTED  NONE DETECTED    Amphetamines NONE DETECTED  NONE DETECTED    Tetrahydrocannabinol NONE DETECTED  NONE DETECTED    Barbiturates NONE DETECTED  NONE DETECTED   GLUCOSE, CAPILLARY     Status: Abnormal   Collection Time   03/28/12  4:50 AM      Component Value Range Comment   Glucose-Capillary 287 (*) 70 - 99 mg/dL    Comment 1 Documented in Chart      Comment 2 Notify RN     GLUCOSE, CAPILLARY     Status: Abnormal   Collection Time   03/28/12  8:49 AM      Component Value Range Comment   Glucose-Capillary 230 (*) 70 - 99 mg/dL    Comment 1 Notify RN     ETHANOL     Status: Abnormal   Collection Time   03/28/12  9:05 AM      Component Value Range Comment   Alcohol, Ethyl (B) 119 (*) 0 - 11 mg/dL   GLUCOSE, CAPILLARY     Status: Abnormal   Collection Time   03/28/12  1:10 PM      Component Value Range Comment   Glucose-Capillary 232 (*) 70 - 99 mg/dL    Comment 1 Notify RN     GLUCOSE, CAPILLARY     Status: Abnormal   Collection Time   03/28/12  5:24 PM      Component Value Range Comment   Glucose-Capillary 256 (*) 70 - 99 mg/dL     No results found.  No psychosis and Positive for anxiety, bad mood, bipolar, depression, excessive alcohol consumption, illegal drug usage, mood swings, sleep disturbance and tobacco use Blood pressure 176/78, pulse 98, temperature 98.3 F (36.8 C), temperature source Oral, resp. rate 16, height 5\' 11"  (1.803 m), weight 243 lb (110.224 kg), SpO2 95.00%.   Assessment/Plan: Alcohol dependence Alcohol intoxication Alcohol withdrawal symptoms Cocaine abuse History of for bipolar disorder Noncompliance with medication  Recommended as acute psychiatric hospitalization for crisis stabilization. Safety and the alcohol detox treatment and also needed.  Rehabilitation services probably at [facility] recovery services.  [facility] program and he also will start the Librium protocol at [facility] emergency department.   [name],Darrell R. 03/28/2012, 5:39 PM

## 2012-03-28 NOTE — ED Provider Notes (Signed)
Medical screening examination/treatment/procedure(s) were performed by non-physician practitioner and as supervising physician I was immediately available for consultation/collaboration.   Loren Racer, MD 03/28/12 [phone]

## 2012-03-28 NOTE — ED Notes (Signed)
Dr Rulon Abide aware of  Blood sugars no change at this will cont to montior Lantus  At 2100

## 2012-03-29 ENCOUNTER — Inpatient Hospital Stay (HOSPITAL_COMMUNITY): Admit: 2012-03-29 | Payer: Self-pay

## 2012-03-29 LAB — GLUCOSE, CAPILLARY: Glucose-Capillary: 126 mg/dL — ABNORMAL HIGH (ref 70–99)

## 2012-03-29 MED ORDER — LORAZEPAM 1 MG PO TABS: 1.0000 mg | ORAL_TABLET | Freq: Three times a day (TID) | ORAL | Status: DC | PRN

## 2012-03-29 NOTE — ED Provider Notes (Addendum)
Pt seen and evaluated in the psych ED.  He is sleeping comfortably.  No current complaints.  He has been accepted at [facility] and is awaiting bed availability.    2:13 PM Per ACT team she has found him a bed at [facility], however patient is now concerned about bills, etc at home and would like to be discharged.  He has an appointment at [facility]- ACT has discussed with him that he will need detox before residential and have encouraged him to go today while he has a bed available, but pt is declining.  He has not had any suicidal ideations, he is here on a voluntary basis, so I will discharge.    Ethelda Chick, MD 03/29/12 [id number]  Ethelda Chick, MD 03/29/12 1414

## 2012-03-29 NOTE — ED Notes (Signed)
Patient currently asleep; no s/s of distress noted. Medications effective; respirations regular and unlabored.

## 2012-03-31 NOTE — ED Provider Notes (Signed)
Medical screening examination/treatment/procedure(s) were performed by non-physician practitioner and as supervising physician I was immediately available for consultation/collaboration.   Towanna Avery, MD 03/31/12 0859 

## 2012-04-01 ENCOUNTER — Encounter (HOSPITAL_COMMUNITY): Payer: Self-pay | Admitting: Emergency Medicine

## 2012-04-01 ENCOUNTER — Emergency Department (HOSPITAL_COMMUNITY)
Admission: EM | Admit: 2012-04-01 | Discharge: 2012-04-02 | Disposition: A | Discharge status: Home / Self Care | Payer: Medicare PPO | Source: Home / Self Care | Attending: Emergency Medicine | Admitting: Emergency Medicine

## 2012-04-01 DIAGNOSIS — Z79899 Other long term (current) drug therapy: Secondary | ICD-10-CM | POA: Insufficient documentation

## 2012-04-01 DIAGNOSIS — I10 Essential (primary) hypertension: Secondary | ICD-10-CM | POA: Insufficient documentation

## 2012-04-01 DIAGNOSIS — G589 Mononeuropathy, unspecified: Secondary | ICD-10-CM | POA: Insufficient documentation

## 2012-04-01 DIAGNOSIS — F329 Major depressive disorder, single episode, unspecified: Secondary | ICD-10-CM | POA: Insufficient documentation

## 2012-04-01 DIAGNOSIS — Z760 Encounter for issue of repeat prescription: Secondary | ICD-10-CM | POA: Insufficient documentation

## 2012-04-01 DIAGNOSIS — E119 Type 2 diabetes mellitus without complications: Secondary | ICD-10-CM | POA: Insufficient documentation

## 2012-04-01 DIAGNOSIS — F17200 Nicotine dependence, unspecified, uncomplicated: Secondary | ICD-10-CM | POA: Insufficient documentation

## 2012-04-01 DIAGNOSIS — F3289 Other specified depressive episodes: Secondary | ICD-10-CM | POA: Insufficient documentation

## 2012-04-01 DIAGNOSIS — Z794 Long term (current) use of insulin: Secondary | ICD-10-CM | POA: Insufficient documentation

## 2012-04-01 DIAGNOSIS — Z8739 Personal history of other diseases of the musculoskeletal system and connective tissue: Secondary | ICD-10-CM | POA: Insufficient documentation

## 2012-04-01 DIAGNOSIS — R739 Hyperglycemia, unspecified: Secondary | ICD-10-CM

## 2012-04-01 DIAGNOSIS — Z8673 Personal history of transient ischemic attack (TIA), and cerebral infarction without residual deficits: Secondary | ICD-10-CM | POA: Insufficient documentation

## 2012-04-01 LAB — BASIC METABOLIC PANEL
Calcium: 8.1 mg/dL — ABNORMAL LOW (ref 8.4–10.5)
GFR calc Af Amer: >90 mL/min (ref >90)
GFR calc non Af Amer: >90 mL/min (ref >90)
Potassium: 3.8 mEq/L (ref 3.5–5.1)
Sodium: 133 mEq/L — ABNORMAL LOW (ref 135–145)

## 2012-04-01 LAB — CBC
Hemoglobin: 8.9 g/dL — ABNORMAL LOW (ref 13.0–17.0)
Platelets: 45 10*3/uL — ABNORMAL LOW (ref 150–400)
RBC: 3.22 MIL/uL — ABNORMAL LOW (ref 4.22–5.81)
WBC: 4.9 10*3/uL (ref 4.0–10.5)

## 2012-04-01 LAB — GLUCOSE, CAPILLARY
Glucose-Capillary: 296 mg/dL — ABNORMAL HIGH (ref 70–99)
Glucose-Capillary: 208 mg/dL — ABNORMAL HIGH (ref 70–99)

## 2012-04-01 MED ORDER — FOLIC ACID 1 MG PO TABS
1.0000 mg | ORAL_TABLET | Freq: Every day | ORAL | Status: DC
  Administered 2012-04-01 – 2012-04-02 (×2): 1 mg via ORAL
  Filled 2012-04-01 (×2): qty 1

## 2012-04-01 MED ORDER — ALBUTEROL SULFATE HFA 108 (90 BASE) MCG/ACT IN AERS: 2.0000 | INHALATION_SPRAY | Freq: Four times a day (QID) | RESPIRATORY_TRACT | Status: DC | PRN

## 2012-04-01 MED ORDER — GABAPENTIN 400 MG PO CAPS: 400.0000 mg | ORAL_CAPSULE | Freq: Three times a day (TID) | ORAL | Status: DC

## 2012-04-01 MED ORDER — LORAZEPAM 1 MG PO TABS
1.0000 mg | ORAL_TABLET | Freq: Once | ORAL | Status: AC
  Administered 2012-04-01: 1 mg via ORAL
  Filled 2012-04-01: qty 1

## 2012-04-01 MED ORDER — INSULIN GLARGINE 100 UNIT/ML ~~LOC~~ SOLN: 30.0000 [IU] | Freq: Every day | SUBCUTANEOUS | Status: DC

## 2012-04-01 MED ORDER — DIAZEPAM 5 MG PO TABS
5.0000 mg | ORAL_TABLET | Freq: Once | ORAL | Status: AC
  Administered 2012-04-01: 5 mg via ORAL
  Filled 2012-04-01: qty 1

## 2012-04-01 MED ORDER — THERA M PLUS PO TABS: 1.0000 | ORAL_TABLET | Freq: Every day | ORAL | Status: DC

## 2012-04-01 MED ORDER — GABAPENTIN 400 MG PO CAPS
400.0000 mg | ORAL_CAPSULE | Freq: Three times a day (TID) | ORAL | Status: DC
  Administered 2012-04-01 – 2012-04-02 (×5): 400 mg via ORAL
  Filled 2012-04-01 (×7): qty 1

## 2012-04-01 MED ORDER — LISINOPRIL 20 MG PO TABS: 10.0000 mg | ORAL_TABLET | Freq: Every day | ORAL | Status: DC

## 2012-04-01 MED ORDER — VITAMIN B-1 100 MG PO TABS
100.0000 mg | ORAL_TABLET | Freq: Every day | ORAL | Status: DC
  Administered 2012-04-01 – 2012-04-02 (×2): 100 mg via ORAL
  Filled 2012-04-01 (×2): qty 1

## 2012-04-01 MED ORDER — INSULIN GLARGINE 100 UNIT/ML ~~LOC~~ SOLN
30.0000 [IU] | Freq: Once | SUBCUTANEOUS | Status: AC
  Administered 2012-04-01: 30 [IU] via SUBCUTANEOUS
  Filled 2012-04-01: qty 1

## 2012-04-01 MED ORDER — FUROSEMIDE 20 MG PO TABS
20.0000 mg | ORAL_TABLET | Freq: Every day | ORAL | Status: DC
  Administered 2012-04-01 – 2012-04-02 (×2): 20 mg via ORAL
  Filled 2012-04-01 (×2): qty 1

## 2012-04-01 MED ORDER — IBUPROFEN 200 MG PO TABS
400.0000 mg | ORAL_TABLET | Freq: Four times a day (QID) | ORAL | Status: DC | PRN
  Administered 2012-04-01 – 2012-04-02 (×3): 400 mg via ORAL
  Filled 2012-04-01 (×3): qty 2

## 2012-04-01 MED ORDER — INSULIN GLARGINE 100 UNIT/ML ~~LOC~~ SOLN
30.0000 [IU] | Freq: Every day | SUBCUTANEOUS | Status: DC
  Administered 2012-04-01: 30 [IU] via SUBCUTANEOUS
  Filled 2012-04-01: qty 1

## 2012-04-01 MED ORDER — THIAMINE HCL 100 MG/ML IJ SOLN: 100.0000 mg | Freq: Every day | INTRAMUSCULAR | Status: DC

## 2012-04-01 MED ORDER — ASPIRIN EC 81 MG PO TBEC
81.0000 mg | DELAYED_RELEASE_TABLET | Freq: Every day | ORAL | Status: DC
  Administered 2012-04-01 – 2012-04-02 (×2): 81 mg via ORAL
  Filled 2012-04-01 (×2): qty 1

## 2012-04-01 MED ORDER — NICOTINE 21 MG/24HR TD PT24
21.0000 mg | MEDICATED_PATCH | Freq: Every day | TRANSDERMAL | Status: DC
  Administered 2012-04-01 – 2012-04-02 (×2): 21 mg via TRANSDERMAL
  Filled 2012-04-01 (×2): qty 1

## 2012-04-01 MED ORDER — FUROSEMIDE 20 MG PO TABS: 20.0000 mg | ORAL_TABLET | Freq: Every day | ORAL | Status: DC

## 2012-04-01 MED ORDER — LISINOPRIL 10 MG PO TABS
10.0000 mg | ORAL_TABLET | Freq: Every day | ORAL | Status: DC
  Administered 2012-04-01 – 2012-04-02 (×2): 10 mg via ORAL
  Filled 2012-04-01 (×2): qty 1

## 2012-04-01 MED ORDER — SODIUM CHLORIDE 0.9 % IV BOLUS (SEPSIS)
1000.0000 mL | Freq: Once | INTRAVENOUS | Status: AC
  Administered 2012-04-01: 1000 mL via INTRAVENOUS

## 2012-04-01 MED ORDER — VITAMIN D3 25 MCG (1000 UNIT) PO TABS
1000.0000 [IU] | ORAL_TABLET | Freq: Every day | ORAL | Status: DC
  Administered 2012-04-01 – 2012-04-02 (×2): 1000 [IU] via ORAL
  Filled 2012-04-01 (×2): qty 1

## 2012-04-01 MED ORDER — DIAZEPAM 5 MG PO TABS
10.0000 mg | ORAL_TABLET | Freq: Three times a day (TID) | ORAL | Status: DC | PRN
  Administered 2012-04-01 – 2012-04-02 (×3): 10 mg via ORAL
  Filled 2012-04-01 (×3): qty 2

## 2012-04-01 MED ORDER — ADULT MULTIVITAMIN W/MINERALS CH
1.0000 | ORAL_TABLET | Freq: Every day | ORAL | Status: DC
  Administered 2012-04-01 – 2012-04-02 (×2): 1 via ORAL
  Filled 2012-04-01 (×2): qty 1

## 2012-04-01 NOTE — ED Notes (Signed)
Per EMS , pt. Is from home with complaint t of depression, anxiety  with HA, claimed of being Diabetic with cbg of 350mg /dl.did not medicate self last night. No other distress reported.

## 2012-04-01 NOTE — ED Notes (Signed)
BG 200

## 2012-04-01 NOTE — ED Notes (Signed)
Pt ambulated to bathroom 
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
## 2012-04-01 NOTE — Progress Notes (Signed)
Hospital doctor, Diplomatic Services operational officer, called ACT for [place], [place], to get an update.  Informed them that the note asked for a SW consult and that I had spoken to [place] who on the med floor today.  Rolena Infante will look into it and come assess if appropriate.  I informed Amber I would assist in anyway, but based on the notes, the pt was being d/c from the ED and refused to leave.  The MD requested SW.  Amber confirmed that was in the system for orders based on her review as well.  Every attempt will be made to work as a team in an effort to provide the best care to the pt - currently, SW is reviewing the case.

## 2012-04-01 NOTE — ED Notes (Signed)
Pt c/o anxiety and head ache rated 7/10, states "I feel like I'm jumping out of my skin."; pt noted to be shaking, red faced; B/P 181/81 w/ HR 102.  Dr. Rennis Chris made aware; ativan 1mg  po ordered.

## 2012-04-01 NOTE — ED Provider Notes (Signed)
Medical screening examination/treatment/procedure(s) were performed by non-physician practitioner and as supervising physician I was immediately available for consultation/collaboration.  Olando Willems M Syvanna Ciolino, MD 04/01/12 0756 

## 2012-04-01 NOTE — ED Provider Notes (Signed)
Pt was seen by ACT, felt to be a candidate for [facility] for alcohol detox.  Apparently has a longstanding hx of ETOH abuse.  Placed to alcohol detox protocol.   Awaiting placement.  Rolan Bucco, MD 04/01/12 1058

## 2012-04-01 NOTE — ED Notes (Addendum)
Pt. Refused to be discharged at this time, claimed he is seeking for "treatment" for his depression and drinking problem. PA notified , was at bedside and spoke to pt.. Pt. Is for consult to ACT ad social work, carried out. Pt in bed resting.

## 2012-04-01 NOTE — ED Notes (Signed)
Bed:WA07<BR> Expected date:<BR> Expected time:<BR> Means of arrival:<BR> Comments:<BR> EMS

## 2012-04-02 ENCOUNTER — Emergency Department (HOSPITAL_COMMUNITY)
Admission: EM | Admit: 2012-04-02 | Discharge: 2012-04-02 | Disposition: A | Discharge status: Home / Self Care | Payer: Medicare PPO | Source: Home / Self Care | Attending: Emergency Medicine | Admitting: Emergency Medicine

## 2012-04-02 ENCOUNTER — Encounter (HOSPITAL_COMMUNITY): Payer: Self-pay | Admitting: *Deleted

## 2012-04-02 DIAGNOSIS — M129 Arthropathy, unspecified: Secondary | ICD-10-CM | POA: Insufficient documentation

## 2012-04-02 DIAGNOSIS — F17200 Nicotine dependence, unspecified, uncomplicated: Secondary | ICD-10-CM | POA: Insufficient documentation

## 2012-04-02 DIAGNOSIS — Z7982 Long term (current) use of aspirin: Secondary | ICD-10-CM | POA: Insufficient documentation

## 2012-04-02 DIAGNOSIS — F419 Anxiety disorder, unspecified: Secondary | ICD-10-CM

## 2012-04-02 DIAGNOSIS — F319 Bipolar disorder, unspecified: Secondary | ICD-10-CM | POA: Insufficient documentation

## 2012-04-02 DIAGNOSIS — Z8673 Personal history of transient ischemic attack (TIA), and cerebral infarction without residual deficits: Secondary | ICD-10-CM | POA: Insufficient documentation

## 2012-04-02 DIAGNOSIS — E119 Type 2 diabetes mellitus without complications: Secondary | ICD-10-CM | POA: Insufficient documentation

## 2012-04-02 DIAGNOSIS — Z79899 Other long term (current) drug therapy: Secondary | ICD-10-CM | POA: Insufficient documentation

## 2012-04-02 DIAGNOSIS — I10 Essential (primary) hypertension: Secondary | ICD-10-CM | POA: Insufficient documentation

## 2012-04-02 DIAGNOSIS — F411 Generalized anxiety disorder: Secondary | ICD-10-CM | POA: Insufficient documentation

## 2012-04-02 LAB — COMPREHENSIVE METABOLIC PANEL
ALT: 21 U/L (ref 0–53)
Alkaline Phosphatase: 92 U/L (ref 39–117)
CO2: 24 mEq/L (ref 19–32)
Calcium: 8.5 mg/dL (ref 8.4–10.5)
Chloride: 97 mEq/L (ref 96–112)
GFR calc Af Amer: >90 mL/min (ref >90)
GFR calc non Af Amer: >90 mL/min (ref >90)
Glucose, Bld: 286 mg/dL — ABNORMAL HIGH (ref 70–99)
Sodium: 130 mEq/L — ABNORMAL LOW (ref 135–145)
Total Bilirubin: 1.5 mg/dL — ABNORMAL HIGH (ref 0.3–1.2)

## 2012-04-02 LAB — CBC WITH DIFFERENTIAL/PLATELET
Basophils Relative: 0 % (ref 0–1)
Eosinophils Relative: 5 % (ref 0–5)
Hemoglobin: 9.7 g/dL — ABNORMAL LOW (ref 13.0–17.0)
MCH: 27.8 pg (ref 26.0–34.0)
MCV: 84.2 fL (ref 78.0–100.0)
Monocytes Absolute: 0.4 10*3/uL (ref 0.1–1.0)
Neutrophils Relative %: 66 % (ref 43–77)
RBC: 3.49 MIL/uL — ABNORMAL LOW (ref 4.22–5.81)

## 2012-04-02 LAB — GLUCOSE, CAPILLARY: Glucose-Capillary: 245 mg/dL — ABNORMAL HIGH (ref 70–99)

## 2012-04-02 MED ORDER — DIAZEPAM 5 MG PO TABS: 5.0000 mg | ORAL_TABLET | Freq: Two times a day (BID) | ORAL | Status: DC

## 2012-04-02 MED ORDER — INSULIN ASPART 100 UNIT/ML ~~LOC~~ SOLN
0.0000 [IU] | Freq: Once | SUBCUTANEOUS | Status: AC
  Administered 2012-04-02: 5 [IU] via SUBCUTANEOUS

## 2012-04-02 MED ORDER — INSULIN ASPART 100 UNIT/ML ~~LOC~~ SOLN
0.0000 [IU] | Freq: Three times a day (TID) | SUBCUTANEOUS | Status: DC
  Administered 2012-04-02 (×2): 8 [IU] via SUBCUTANEOUS
  Filled 2012-04-02: qty 1
  Filled 2012-04-02: qty 5

## 2012-04-02 MED ORDER — LORAZEPAM 2 MG/ML IJ SOLN
3.0000 mg | Freq: Once | INTRAMUSCULAR | Status: AC
  Administered 2012-04-02: 3 mg via INTRAMUSCULAR
  Filled 2012-04-02: qty 2

## 2012-04-02 MED ORDER — INSULIN GLARGINE 100 UNIT/ML ~~LOC~~ SOLN: 30.0000 [IU] | Freq: Every day | SUBCUTANEOUS | Status: DC

## 2012-04-02 NOTE — ED Notes (Signed)
Pt just discharged from psych ER; his ride cancelled on him after he was discharged and doesn't have a ride home; states is anxious because he doesn't have a way home or a way to get his meds tonight

## 2012-04-02 NOTE — [facility] Counselor (Signed)
Verne Spurr gave the following recommendation, stool [place], Repeat CBC and Obtain CMP.  Pt is considered not medically cleared. Must be re-run after results return. Please address abnormal lab findings, per Verne Spurr

## 2012-04-02 NOTE — ED Notes (Signed)
Discharge instructions reviewed w/ pt., verbalizes understanding. No prescriptions provided at discharge. 

## 2012-04-02 NOTE — ED Provider Notes (Signed)
Patient's hemoglobin noted and will recheck today. Behavior health is also requested a repeat CMP and that is pending as well. No evidence of ongoing blood loss at this time suspect that patient's anemia is chronic  [name], MD 04/02/12 [phone]

## 2012-04-02 NOTE — Progress Notes (Signed)
CSW met with pt at bedside. Pt was recently discharged from the hosptial after requesting discharge due to his ride being here and wanting to follow up outpatient for detox and depression. Pt stated that he became so upset and increased anxiety when his girlfriend/fiance never showed up to get patient. Pt stated that after he called back several times patient stated that pt fiance other man answered the phone and hung up on him. Pt reports that he has had a suspicion of his fiance cheating on him, however pt fiance daughter and pt fiance actions tonight confirmed it.   Pt is able to contact for safety. Pt denies SI/HI/AH/VH. Pt is shaky and talking fast however is able to calm down through talking to CSW.   Pt shared with CSW that pt fiance was at pt fiance boyfriends house. Pt stated that he would be ok if he found a way home. Pt and CSW discussed different options for transportation. Pt has not been able to reach any friends for a ride home. Pt shared that pt mother is wiring money through [employer] for patient and patient can get a ride from a friend tomorrow to get medication and money.   CSW and pt discussed pt going home tonight with cab voucher. Pt stated that he would be ok returning home and thanked csw for help.    Pt plans to dc home tonight once medically clear with a cab voucher. Pt plans to follow up with [facility] for appt on Thursday for Residential treatment. Pt also plans to follow up with psychiatrist for continued outpatient treatment.   .No further Clinical Social Work needs, signing off.   Catha Gosselin, LCSWA  [phone] .04/02/2012 1033pm

## 2012-04-02 NOTE — ED Provider Notes (Signed)
[name] and is requesting to leave. His ride is here and he states he wants to followup as an outpatient. He is here for alcohol detox and depression. He there is no reason that he needs to be kept involuntarily and patient will be allowed to discharge home.  it appears to be a lab work that he has been anemic however repeat test today are stable  [name], MD 04/02/12 [phone]

## 2012-04-02 NOTE — ED Provider Notes (Addendum)
History     CSN: [id number]  Arrival date & time 04/02/12  2031   First MD Initiated Contact with Patient 04/02/12 2130      Chief Complaint  Patient presents with  . Anxiety    (Consider location/radiation/quality/duration/timing/severity/associated sxs/prior treatment) HPI Comments: Denies suicide  Patient is a 56 y.o. male presenting with anxiety. The history is provided by the patient.  Anxiety This is a recurrent problem. The current episode started less than 1 hour ago. The problem occurs constantly. The problem has been rapidly worsening. Associated symptoms comments: He just left [facility] psych ED to f/u as an outpt and his ride did not show up and his girlfriends boyfriend answered the phone and she did not come to pick him up.  This has made him very anxious . Nothing relieves the symptoms. He has tried nothing for the symptoms.    Past Medical History  Diagnosis Date  . Neuropathy   . Diabetes mellitus   . Bipolar affect, depressed   . Hypertension   . Stroke     Mini stroke about 33yrs ago  . Arthritis     Past Surgical History  Procedure Date  . Fracture surgery     Leg and arm 40yrs ago    Family History  Problem Relation Age of Onset  . Hypotension Mother     History  Substance Use Topics  . Smoking status: Current Every Day Smoker -- 0.5 packs/day for 30 years    Types: Cigarettes  . Smokeless tobacco: Never Used  . Alcohol Use: 51.6 oz/week    86 Cans of beer per week     Comment: heavy (approx 12/day)      Review of Systems  All other systems reviewed and are negative.    Allergies  Review of patient's allergies indicates no known allergies.  Home Medications   Current Outpatient Rx  Name  Route  Sig  Dispense  Refill  . ALBUTEROL SULFATE HFA 108 (90 BASE) MCG/ACT IN AERS   Inhalation   Inhale 2 puffs into the lungs every 6 (six) hours as needed. For shortness of breath         . ALPRAZOLAM 2 MG PO TABS   Oral   Take 2 mg by  mouth 3 (three) times daily as needed. For anxiety         . ASPIRIN EC 81 MG PO TBEC   Oral   Take 81 mg by mouth daily.           Marland Kitchen VITAMIN D 1000 UNITS PO TABS   Oral   Take 1,000 Units by mouth daily.         Marland Kitchen DIAZEPAM 5 MG PO TABS   Oral   Take 1 tablet (5 mg total) by mouth 2 (two) times daily.   10 tablet   0   . GOODY PM PO   Oral   Take 1 packet by mouth daily as needed. For pain         . FUROSEMIDE 20 MG PO TABS   Oral   Take 20 mg by mouth daily.         Marland Kitchen GABAPENTIN 400 MG PO CAPS   Oral   Take 400 mg by mouth 3 (three) times daily.         . INSULIN GLARGINE 100 UNIT/ML [place] SOLN   Subcutaneous   Inject 30 Units into the skin at bedtime.         Marland Kitchen  LISINOPRIL 20 MG PO TABS   Oral   Take 0.5 tablets (10 mg total) by mouth daily.   20 tablet   0   . LORAZEPAM 1 MG PO TABS   Oral   Take 1 tablet (1 mg total) by mouth 3 (three) times daily as needed for anxiety.   6 tablet   0   . THERA M PLUS PO TABS   Oral   Take 1 tablet by mouth daily.   30 each   0   . OXYCODONE-ACETAMINOPHEN 5-325 MG PO TABS   Oral   Take 1 tablet by mouth every 6 (six) hours as needed. For pain         . POTASSIUM PO   Oral   Take 1 tablet by mouth daily. Over the counter potassium tablet-pt unsure of strength           BP 192/109  Pulse 117  Temp 98.7 F (37.1 C)  Resp 20  SpO2 99%  Physical Exam  Nursing note and vitals reviewed. Constitutional: He is oriented to person, place, and time. He appears well-developed and well-nourished. He appears distressed.  HENT:  Head: Normocephalic and atraumatic.  Mouth/Throat: Oropharynx is clear and moist.  Eyes: Conjunctivae normal and EOM are normal. Pupils are equal, round, and reactive to light.  Neck: Normal range of motion. Neck supple.  Cardiovascular: Tachycardia present.   Pulmonary/Chest: Effort normal.  Musculoskeletal: Normal range of motion. He exhibits no edema and no tenderness.    Neurological: He is alert and oriented to person, place, and time.  Skin: Skin is warm and dry. No rash noted. No erythema.  Psychiatric: His behavior is normal. His mood appears anxious.       Shaking in the room and very anxious    ED Course  Procedures (including critical care time)  Labs Reviewed - No data to display No results found.   1. Anxiety       MDM   Patient just left from the was a long psych ED approximately 1 hour ago. He has a history of depression and anxiety however he is not suicidal but his ride did not show up. This has spiked his anxiety and now he is very upset and states he can't get home, and doesn't have his anxiety medicine.  Pt is not suicidal and states that he would be ok if he could get home. Pt given IM ativan.  Will attempt to find him a ride home.  10:30 PM Pt given a cab voucher home.  Feeling better and was d/ced.     [name], MD 04/02/12 2231  [name], MD 04/02/12 2237

## 2012-04-03 ENCOUNTER — Encounter (HOSPITAL_COMMUNITY): Payer: Self-pay | Admitting: Emergency Medicine

## 2012-04-03 ENCOUNTER — Inpatient Hospital Stay (HOSPITAL_COMMUNITY)
Admission: EM | Admit: 2012-04-03 | Discharge: 2012-04-08 | DRG: 377 | Disposition: A | Discharge status: Home / Self Care | Payer: Medicare PPO | Attending: Emergency Medicine | Admitting: Emergency Medicine

## 2012-04-03 DIAGNOSIS — G609 Hereditary and idiopathic neuropathy, unspecified: Secondary | ICD-10-CM | POA: Diagnosis present

## 2012-04-03 DIAGNOSIS — K922 Gastrointestinal hemorrhage, unspecified: Secondary | ICD-10-CM | POA: Diagnosis present

## 2012-04-03 DIAGNOSIS — N179 Acute kidney failure, unspecified: Secondary | ICD-10-CM | POA: Diagnosis present

## 2012-04-03 DIAGNOSIS — F1020 Alcohol dependence, uncomplicated: Secondary | ICD-10-CM | POA: Diagnosis present

## 2012-04-03 DIAGNOSIS — K709 Alcoholic liver disease, unspecified: Secondary | ICD-10-CM

## 2012-04-03 DIAGNOSIS — F1410 Cocaine abuse, uncomplicated: Secondary | ICD-10-CM | POA: Diagnosis present

## 2012-04-03 DIAGNOSIS — F1010 Alcohol abuse, uncomplicated: Secondary | ICD-10-CM

## 2012-04-03 DIAGNOSIS — K269 Duodenal ulcer, unspecified as acute or chronic, without hemorrhage or perforation: Secondary | ICD-10-CM

## 2012-04-03 DIAGNOSIS — D62 Acute posthemorrhagic anemia: Secondary | ICD-10-CM

## 2012-04-03 DIAGNOSIS — I8500 Esophageal varices without bleeding: Secondary | ICD-10-CM

## 2012-04-03 DIAGNOSIS — F17200 Nicotine dependence, unspecified, uncomplicated: Secondary | ICD-10-CM | POA: Diagnosis present

## 2012-04-03 DIAGNOSIS — K254 Chronic or unspecified gastric ulcer with hemorrhage: Principal | ICD-10-CM | POA: Diagnosis present

## 2012-04-03 DIAGNOSIS — I10 Essential (primary) hypertension: Secondary | ICD-10-CM | POA: Diagnosis present

## 2012-04-03 DIAGNOSIS — I868 Varicose veins of other specified sites: Secondary | ICD-10-CM | POA: Diagnosis present

## 2012-04-03 DIAGNOSIS — K7030 Alcoholic cirrhosis of liver without ascites: Secondary | ICD-10-CM | POA: Diagnosis present

## 2012-04-03 DIAGNOSIS — F3130 Bipolar disorder, current episode depressed, mild or moderate severity, unspecified: Secondary | ICD-10-CM | POA: Diagnosis present

## 2012-04-03 DIAGNOSIS — R1110 Vomiting, unspecified: Secondary | ICD-10-CM

## 2012-04-03 DIAGNOSIS — K259 Gastric ulcer, unspecified as acute or chronic, without hemorrhage or perforation: Secondary | ICD-10-CM

## 2012-04-03 DIAGNOSIS — R578 Other shock: Secondary | ICD-10-CM

## 2012-04-03 DIAGNOSIS — Z8673 Personal history of transient ischemic attack (TIA), and cerebral infarction without residual deficits: Secondary | ICD-10-CM

## 2012-04-03 DIAGNOSIS — E118 Type 2 diabetes mellitus with unspecified complications: Secondary | ICD-10-CM | POA: Diagnosis present

## 2012-04-03 DIAGNOSIS — E119 Type 2 diabetes mellitus without complications: Secondary | ICD-10-CM | POA: Diagnosis present

## 2012-04-03 DIAGNOSIS — J9600 Acute respiratory failure, unspecified whether with hypoxia or hypercapnia: Secondary | ICD-10-CM

## 2012-04-03 DIAGNOSIS — Z23 Encounter for immunization: Secondary | ICD-10-CM

## 2012-04-03 DIAGNOSIS — E860 Dehydration: Secondary | ICD-10-CM | POA: Diagnosis present

## 2012-04-03 DIAGNOSIS — R197 Diarrhea, unspecified: Secondary | ICD-10-CM

## 2012-04-03 DIAGNOSIS — K920 Hematemesis: Secondary | ICD-10-CM

## 2012-04-03 DIAGNOSIS — D696 Thrombocytopenia, unspecified: Secondary | ICD-10-CM | POA: Diagnosis present

## 2012-04-03 DIAGNOSIS — M129 Arthropathy, unspecified: Secondary | ICD-10-CM | POA: Diagnosis present

## 2012-04-03 DIAGNOSIS — D649 Anemia, unspecified: Secondary | ICD-10-CM

## 2012-04-03 DIAGNOSIS — D684 Acquired coagulation factor deficiency: Secondary | ICD-10-CM | POA: Diagnosis present

## 2012-04-03 LAB — COMPREHENSIVE METABOLIC PANEL
ALT: 18 U/L (ref 0–53)
Alkaline Phosphatase: 61 U/L (ref 39–117)
CO2: 21 mEq/L (ref 19–32)
Chloride: 94 mEq/L — ABNORMAL LOW (ref 96–112)
GFR calc Af Amer: 53 mL/min — ABNORMAL LOW (ref >90)
GFR calc non Af Amer: 46 mL/min — ABNORMAL LOW (ref >90)
Glucose, Bld: 279 mg/dL — ABNORMAL HIGH (ref 70–99)
Potassium: 5.2 mEq/L — ABNORMAL HIGH (ref 3.5–5.1)
Sodium: 130 mEq/L — ABNORMAL LOW (ref 135–145)

## 2012-04-03 LAB — CBC WITH DIFFERENTIAL/PLATELET
Lymphocytes Relative: 20 % (ref 12–46)
Lymphs Abs: 1.2 10*3/uL (ref 0.7–4.0)
Neutrophils Relative %: 66 % (ref 43–77)
Platelets: 79 10*3/uL — ABNORMAL LOW (ref 150–400)
RBC: 2.21 MIL/uL — ABNORMAL LOW (ref 4.22–5.81)
WBC: 5.8 10*3/uL (ref 4.0–10.5)

## 2012-04-03 MED ORDER — ONDANSETRON HCL 4 MG/2ML IJ SOLN
4.0000 mg | Freq: Once | INTRAMUSCULAR | Status: AC
  Administered 2012-04-03: 4 mg via INTRAVENOUS
  Filled 2012-04-03: qty 2

## 2012-04-03 MED ORDER — SODIUM POLYSTYRENE SULFONATE 15 GM/60ML PO SUSP
30.0000 g | Freq: Once | ORAL | Status: AC
  Administered 2012-04-03: 30 g via ORAL
  Filled 2012-04-03: qty 120

## 2012-04-03 MED ORDER — SODIUM CHLORIDE 0.9 % IV BOLUS (SEPSIS)
1000.0000 mL | Freq: Once | INTRAVENOUS | Status: AC
  Administered 2012-04-03: 1000 mL via INTRAVENOUS

## 2012-04-03 NOTE — ED Provider Notes (Signed)
56 year old male started having vomiting and diarrhea this afternoon. He states that his girlfriend claimed that he vomited some blood but he was not aware of any blood. Is not aware of any blood in his stool. He denies fever, chills, sweats. He thinks the vomiting and diarrhea was because of a salad that he ate. On exam, he is resting comfortably and in no acute distress, but he is noted to be hypotensive. He is only minimally tachycardic. Conjunctiva are pale, lungs are clear, heart is regular rate and rhythm without murmur, abdomen is soft and nontender with bowel sounds decreased. Hemoglobin is noted to fall and by 3 g compared with yesterday. After a liter of saline, blood pressures that she decreased slightly although heart rate has also decreased. Stool was found to be Hemoccult positive. He will need blood transfusion and admission toICU or step down.  CRITICAL CARE Performed by: Dione Booze   Total critical care time: 40 minutes  Critical care time was exclusive of separately billable procedures and treating other patients.  Critical care was necessary to treat or prevent imminent or life-threatening deterioration.  Critical care was time spent personally by me on the following activities: development of treatment plan with patient and/or surrogate as well as nursing, discussions with consultants, evaluation of patient's response to treatment, examination of patient, obtaining history from patient or surrogate, ordering and performing treatments and interventions, ordering and review of laboratory studies, ordering and review of radiographic studies, pulse oximetry and re-evaluation of patient's condition.  I saw and evaluated the patient, reviewed the resident's note and I agree with the findings and plan.   Dione Booze, MD 04/04/12 [place]ers

## 2012-04-03 NOTE — ED Notes (Signed)
EMS states patient has been feeling sick and nauseated since 11 am this morning. EMS also states patient has been having diarrhea. Pt was seen at [facility] for unknown reason by EMS. Pt states he was not seen by a MD but was given a prescription for psy hx. EMS states they gave patient 4mg  of Zofran and 500 ML of NS Bolus. Pt alert and oriented. No signs of distress noted.  CBG noted 287 by EMS. No family at present time with patient. Pt does have 1 duffle bag with him. No other personal items except clothing on patient.

## 2012-04-03 NOTE — ED Provider Notes (Signed)
History     CSN: [id number]  Arrival date & time 04/03/12  2140   First MD Initiated Contact with Patient 04/03/12 2144      Chief Complaint  Patient presents with  . Dehydration    (Consider location/radiation/quality/duration/timing/severity/associated sxs/prior treatment) HPI Comments: Patient states he ate at a salad bar around 11 or 12:00 today in one to 2 hours later he sudden onset of diarrhea and vomiting that has persisted over the afternoon. Also complains of mild epigastric pain. Denies any other symptoms or problems. He is also concerned that he only has 4 Ativan pills left and states he is about to run out.  Patient is a 56 y.o. male presenting with vomiting. The history is provided by the patient. No language interpreter was used.  Emesis  This is a new problem. The current episode started 6 to 12 hours ago. The problem occurs 5 to 10 times per day. The problem has not changed since onset.The emesis has an appearance of stomach contents. There has been no fever. Associated symptoms include abdominal pain (mild epigastr, worse w/ vomiting) and diarrhea. Pertinent negatives include no arthralgias, no chills, no cough, no fever and no headaches. Risk factors include suspect food intake.    Past Medical History  Diagnosis Date  . Neuropathy   . Diabetes mellitus   . Bipolar affect, depressed   . Hypertension   . Stroke     Mini stroke about 3yrs ago  . Arthritis     Past Surgical History  Procedure Date  . Fracture surgery     Leg and arm 62yrs ago    Family History  Problem Relation Age of Onset  . Hypotension Mother     History  Substance Use Topics  . Smoking status: Current Every Day Smoker -- 0.5 packs/day for 30 years    Types: Cigarettes  . Smokeless tobacco: Never Used  . Alcohol Use: 51.6 oz/week    86 Cans of beer per week     Comment: heavy (approx 12/day)      Review of Systems  Constitutional: Negative for fever, chills, diaphoresis,  activity change and appetite change.  HENT: Negative for sore throat and neck pain.   Eyes: Negative for discharge and visual disturbance.  Respiratory: Negative for cough, choking and shortness of breath.   Cardiovascular: Negative for chest pain and leg swelling.  Gastrointestinal: Positive for vomiting, abdominal pain (mild epigastr, worse w/ vomiting) and diarrhea. Negative for nausea, constipation, abdominal distention, anal bleeding and rectal pain.  Genitourinary: Negative for dysuria and difficulty urinating.  Musculoskeletal: Negative for back pain and arthralgias.  Skin: Negative for color change and pallor.  Neurological: Negative for dizziness, speech difficulty, light-headedness and headaches.  Psychiatric/Behavioral: Negative for behavioral problems and agitation.  All other systems reviewed and are negative.    Allergies  Review of patient's allergies indicates no known allergies.  Home Medications   Current Outpatient Rx  Name  Route  Sig  Dispense  Refill  . ALBUTEROL SULFATE HFA 108 (90 BASE) MCG/ACT IN AERS   Inhalation   Inhale 2 puffs into the lungs every 6 (six) hours as needed. For shortness of breath         . ASPIRIN EC 81 MG PO TBEC   Oral   Take 81 mg by mouth daily.           Marland Kitchen VITAMIN D 1000 UNITS PO TABS   Oral   Take 1,000 Units by mouth daily.         Marland Kitchen  DIAZEPAM 5 MG PO TABS   Oral   Take 1 tablet (5 mg total) by mouth 2 (two) times daily.   10 tablet   0   . GOODY PM PO   Oral   Take 1 packet by mouth daily as needed. For pain         . FUROSEMIDE 20 MG PO TABS   Oral   Take 20 mg by mouth daily.         Marland Kitchen GABAPENTIN 400 MG PO CAPS   Oral   Take 400 mg by mouth 3 (three) times daily.         . INSULIN GLARGINE 100 UNIT/ML [place] SOLN   Subcutaneous   Inject 30 Units into the skin at bedtime.         Marland Kitchen LISINOPRIL 20 MG PO TABS   Oral   Take 0.5 tablets (10 mg total) by mouth daily.   20 tablet   0   . THERA M PLUS  PO TABS   Oral   Take 1 tablet by mouth daily.   30 each   0   . OXYCODONE-ACETAMINOPHEN 5-325 MG PO TABS   Oral   Take 1 tablet by mouth every 6 (six) hours as needed. For pain         . POTASSIUM PO   Oral   Take 1 tablet by mouth daily. Over the counter potassium tablet-pt unsure of strength           SpO2 100%  Physical Exam  Constitutional: He appears well-developed. No distress.  HENT:  Head: Normocephalic and atraumatic.  Mouth/Throat: No oropharyngeal exudate.  Eyes: EOM are normal. Pupils are equal, round, and reactive to light. Right eye exhibits no discharge. Left eye exhibits no discharge.  Neck: Normal range of motion. Neck supple. No JVD present.  Cardiovascular: Normal rate, regular rhythm and normal heart sounds.   Pulmonary/Chest: Effort normal and breath sounds normal. No stridor. No respiratory distress. He has no wheezes. He has no rales. He exhibits no tenderness.  Abdominal: Soft. Bowel sounds are normal. There is tenderness (very mild epigastr. neg murphys). There is no guarding.  Genitourinary: Penis normal.  Musculoskeletal: Normal range of motion. He exhibits no edema and no tenderness.  Neurological: He is alert. No cranial nerve deficit. He exhibits normal muscle tone.  Skin: Skin is warm and dry. He is not diaphoretic. No erythema. No pallor.  Psychiatric: He has a normal mood and affect. His behavior is normal. Judgment and thought content normal.    ED Course  Procedures (including critical care time)  Labs Reviewed  CBC WITH DIFFERENTIAL - Abnormal; Notable for the following:    RBC 2.21 (*)     Hemoglobin 6.2 (*)     HCT 18.8 (*)     RDW 18.7 (*)     Platelets 79 (*)  PLATELET COUNT CONFIRMED BY SMEAR   Monocytes Relative 13 (*)     All other components within normal limits  COMPREHENSIVE METABOLIC PANEL - Abnormal; Notable for the following:    Sodium 130 (*)     Potassium 5.2 (*)     Chloride 94 (*)     Glucose, Bld 279 (*)       BUN 43 (*)     Creatinine, Ser 1.62 (*)     Calcium 7.9 (*)     Total Protein 5.2 (*)     Albumin 2.5 (*)     Total Bilirubin 1.5 (*)  GFR calc non Af Amer 46 (*)     GFR calc Af Amer 53 (*)     All other components within normal limits  LIPASE, BLOOD  TYPE AND SCREEN  PREPARE RBC (CROSSMATCH)  ABO/RH  LACTIC ACID, PLASMA  PROTIME-INR   No results found.   1. GI bleed   2. Anemia   3. Vomiting   4. Diarrhea       MDM  9:51 PM at salad at a salad bar and then 1-2 hrs later had vom and diarrhea, no blood. Likely food-born vs AGE. Benign abd exam. Will check labs to eval for biliary or pancr source. Stable. IVFs and zofran.  11:33 PM found to be in AKI and anemic. Will txfuse. Hem +. Will admit for txfusion and close monitoring. Has not had a BM or emesis since being here.  Admitted to step down in stable but critical condition. protonix gtt started, awaiting blood.    Warrick Parisian, MD 04/04/12 [phone]

## 2012-04-04 ENCOUNTER — Encounter (HOSPITAL_COMMUNITY)
Admission: EM | Disposition: A | Discharge status: Home / Self Care | Payer: Self-pay | Source: Home / Self Care | Attending: Internal Medicine

## 2012-04-04 ENCOUNTER — Encounter (HOSPITAL_COMMUNITY): Payer: Self-pay | Admitting: Internal Medicine

## 2012-04-04 ENCOUNTER — Inpatient Hospital Stay (HOSPITAL_COMMUNITY): Payer: Medicare PPO

## 2012-04-04 DIAGNOSIS — K259 Gastric ulcer, unspecified as acute or chronic, without hemorrhage or perforation: Secondary | ICD-10-CM

## 2012-04-04 DIAGNOSIS — K920 Hematemesis: Secondary | ICD-10-CM

## 2012-04-04 DIAGNOSIS — N179 Acute kidney failure, unspecified: Secondary | ICD-10-CM | POA: Diagnosis present

## 2012-04-04 DIAGNOSIS — F1010 Alcohol abuse, uncomplicated: Secondary | ICD-10-CM

## 2012-04-04 DIAGNOSIS — E119 Type 2 diabetes mellitus without complications: Secondary | ICD-10-CM | POA: Diagnosis present

## 2012-04-04 DIAGNOSIS — D62 Acute posthemorrhagic anemia: Secondary | ICD-10-CM

## 2012-04-04 DIAGNOSIS — K922 Gastrointestinal hemorrhage, unspecified: Secondary | ICD-10-CM

## 2012-04-04 DIAGNOSIS — R578 Other shock: Secondary | ICD-10-CM

## 2012-04-04 DIAGNOSIS — D649 Anemia, unspecified: Secondary | ICD-10-CM

## 2012-04-04 DIAGNOSIS — K709 Alcoholic liver disease, unspecified: Secondary | ICD-10-CM

## 2012-04-04 DIAGNOSIS — J9600 Acute respiratory failure, unspecified whether with hypoxia or hypercapnia: Secondary | ICD-10-CM

## 2012-04-04 DIAGNOSIS — I8500 Esophageal varices without bleeding: Secondary | ICD-10-CM

## 2012-04-04 DIAGNOSIS — I10 Essential (primary) hypertension: Secondary | ICD-10-CM | POA: Diagnosis present

## 2012-04-04 HISTORY — PX: ESOPHAGOGASTRODUODENOSCOPY: SHX5428

## 2012-04-04 LAB — CBC
MCH: 28.7 pg (ref 26.0–34.0)
Platelets: 64 10*3/uL — ABNORMAL LOW (ref 150–400)
RBC: 2.37 MIL/uL — ABNORMAL LOW (ref 4.22–5.81)
WBC: 6.9 10*3/uL (ref 4.0–10.5)

## 2012-04-04 LAB — URINALYSIS, ROUTINE W REFLEX MICROSCOPIC
Hgb urine dipstick: NEGATIVE
Ketones, ur: NEGATIVE mg/dL
Leukocytes, UA: NEGATIVE
Protein, ur: NEGATIVE mg/dL
Urobilinogen, UA: 1 mg/dL (ref 0.0–1.0)

## 2012-04-04 LAB — BLOOD GAS, ARTERIAL
Bicarbonate: 20.3 mEq/L (ref 20.0–24.0)
Patient temperature: 98.6
TCO2: 21.6 mmol/L (ref 0–100)
pH, Arterial: 7.309 — ABNORMAL LOW (ref 7.350–7.450)

## 2012-04-04 LAB — PROTIME-INR
INR: 1.56 — ABNORMAL HIGH (ref 0.00–1.49)
INR: 1.82 — ABNORMAL HIGH (ref 0.00–1.49)
Prothrombin Time: 18.2 seconds — ABNORMAL HIGH (ref 11.6–15.2)
Prothrombin Time: 20.4 seconds — ABNORMAL HIGH (ref 11.6–15.2)

## 2012-04-04 LAB — GLUCOSE, CAPILLARY
Glucose-Capillary: 238 mg/dL — ABNORMAL HIGH (ref 70–99)
Glucose-Capillary: 179 mg/dL — ABNORMAL HIGH (ref 70–99)

## 2012-04-04 LAB — PREPARE RBC (CROSSMATCH)

## 2012-04-04 LAB — LACTIC ACID, PLASMA: Lactic Acid, Venous: 3.4 mmol/L — ABNORMAL HIGH (ref 0.5–2.2)

## 2012-04-04 SURGERY — EGD (ESOPHAGOGASTRODUODENOSCOPY)
Anesthesia: Moderate Sedation

## 2012-04-04 MED ORDER — INSULIN ASPART 100 UNIT/ML ~~LOC~~ SOLN
0.0000 [IU] | SUBCUTANEOUS | Status: DC
  Administered 2012-04-04: 5 [IU] via SUBCUTANEOUS
  Administered 2012-04-04: 2 [IU] via SUBCUTANEOUS
  Administered 2012-04-04: 3 [IU] via SUBCUTANEOUS
  Administered 2012-04-04: 5 [IU] via SUBCUTANEOUS
  Administered 2012-04-05: 1 [IU] via SUBCUTANEOUS
  Administered 2012-04-05: 3 [IU] via SUBCUTANEOUS
  Administered 2012-04-05: 1 [IU] via SUBCUTANEOUS

## 2012-04-04 MED ORDER — SODIUM CHLORIDE 0.9 % IV SOLN
INTRAVENOUS | Status: DC
  Administered 2012-04-04: 100 mL/h via INTRAVENOUS
  Administered 2012-04-04: 11:00:00 via INTRAVENOUS
  Administered 2012-04-04: 100 mL/h via INTRAVENOUS
  Administered 2012-04-05 – 2012-04-08 (×5): via INTRAVENOUS

## 2012-04-04 MED ORDER — FENTANYL CITRATE 0.05 MG/ML IJ SOLN
150.0000 ug | Freq: Once | INTRAMUSCULAR | Status: AC
  Administered 2012-04-04: 150 ug via INTRAVENOUS

## 2012-04-04 MED ORDER — DEXTROSE 5 % IV SOLN
30.0000 ug/min | INTRAVENOUS | Status: DC
  Administered 2012-04-04: 65 ug/min via INTRAVENOUS
  Administered 2012-04-04: 95 ug/min via INTRAVENOUS
  Administered 2012-04-04: 20 ug/min via INTRAVENOUS
  Administered 2012-04-04: 75 ug/min via INTRAVENOUS
  Filled 2012-04-04 (×6): qty 1

## 2012-04-04 MED ORDER — DEXTROSE 5 % IV SOLN
1.0000 g | INTRAVENOUS | Status: DC
  Administered 2012-04-04 – 2012-04-05 (×2): 1 g via INTRAVENOUS
  Filled 2012-04-04 (×2): qty 10

## 2012-04-04 MED ORDER — VITAMIN K1 10 MG/ML IJ SOLN
5.0000 mg | Freq: Once | INTRAVENOUS | Status: AC
  Administered 2012-04-04: 5 mg via INTRAVENOUS
  Filled 2012-04-04: qty 0.5

## 2012-04-04 MED ORDER — ONDANSETRON HCL 4 MG PO TABS: 4.0000 mg | ORAL_TABLET | Freq: Four times a day (QID) | ORAL | Status: DC | PRN

## 2012-04-04 MED ORDER — VITAMIN B-1 100 MG PO TABS
100.0000 mg | ORAL_TABLET | Freq: Every day | ORAL | Status: DC
  Administered 2012-04-05 – 2012-04-08 (×4): 100 mg via ORAL
  Filled 2012-04-04 (×5): qty 1

## 2012-04-04 MED ORDER — ROCURONIUM BROMIDE 50 MG/5ML IV SOLN
30.0000 mg | Freq: Once | INTRAVENOUS | Status: AC
  Administered 2012-04-04: 30 mg via INTRAVENOUS

## 2012-04-04 MED ORDER — LORAZEPAM 1 MG PO TABS: 1.0000 mg | ORAL_TABLET | Freq: Four times a day (QID) | ORAL | Status: DC | PRN

## 2012-04-04 MED ORDER — SODIUM CHLORIDE 0.9 % IV SOLN: INTRAVENOUS | Status: DC

## 2012-04-04 MED ORDER — ONDANSETRON HCL 4 MG/2ML IJ SOLN: 4.0000 mg | Freq: Four times a day (QID) | INTRAMUSCULAR | Status: DC | PRN

## 2012-04-04 MED ORDER — FENTANYL CITRATE 0.05 MG/ML IJ SOLN
INTRAMUSCULAR | Status: AC
  Filled 2012-04-04: qty 4

## 2012-04-04 MED ORDER — SODIUM CHLORIDE 0.9 % IV BOLUS (SEPSIS)
500.0000 mL | Freq: Once | INTRAVENOUS | Status: AC
  Administered 2012-04-04: 500 mL via INTRAVENOUS

## 2012-04-04 MED ORDER — LORAZEPAM 2 MG/ML IJ SOLN: 0.0000 mg | Freq: Two times a day (BID) | INTRAMUSCULAR | Status: DC

## 2012-04-04 MED ORDER — THIAMINE HCL 100 MG/ML IJ SOLN
100.0000 mg | Freq: Every day | INTRAMUSCULAR | Status: DC
  Administered 2012-04-04: 100 mg via INTRAVENOUS
  Filled 2012-04-04 (×3): qty 1

## 2012-04-04 MED ORDER — SODIUM CHLORIDE 0.9 % IV SOLN
80.0000 mg | Freq: Once | INTRAVENOUS | Status: AC
  Administered 2012-04-04: 80 mg via INTRAVENOUS
  Filled 2012-04-04: qty 80

## 2012-04-04 MED ORDER — SODIUM CHLORIDE 0.9 % IV SOLN
250.0000 mg | Freq: Once | INTRAVENOUS | Status: DC
  Administered 2012-04-04: 250 mg via INTRAVENOUS
  Filled 2012-04-04: qty 250

## 2012-04-04 MED ORDER — VECURONIUM BROMIDE 10 MG IV SOLR
8.0000 mg | INTRAVENOUS | Status: DC | PRN
  Administered 2012-04-04: 8 mg via INTRAVENOUS
  Filled 2012-04-04: qty 10

## 2012-04-04 MED ORDER — SODIUM CHLORIDE 0.9 % IV SOLN
8.0000 mg/h | INTRAVENOUS | Status: DC
  Administered 2012-04-04 – 2012-04-07 (×7): 8 mg/h via INTRAVENOUS
  Filled 2012-04-04 (×15): qty 80

## 2012-04-04 MED ORDER — SODIUM CHLORIDE 0.9 % IV SOLN
25.0000 ug/h | INTRAVENOUS | Status: DC
  Filled 2012-04-04 (×2): qty 50

## 2012-04-04 MED ORDER — ALBUTEROL SULFATE HFA 108 (90 BASE) MCG/ACT IN AERS
2.0000 | INHALATION_SPRAY | Freq: Four times a day (QID) | RESPIRATORY_TRACT | Status: DC | PRN
  Filled 2012-04-04: qty 6.7

## 2012-04-04 MED ORDER — LORAZEPAM 1 MG PO TABS
1.0000 mg | ORAL_TABLET | Freq: Four times a day (QID) | ORAL | Status: AC | PRN
  Administered 2012-04-04 – 2012-04-07 (×2): 1 mg via ORAL
  Filled 2012-04-04 (×2): qty 1

## 2012-04-04 MED ORDER — FENTANYL CITRATE 0.05 MG/ML IJ SOLN
INTRAMUSCULAR | Status: AC
  Filled 2012-04-04: qty 2

## 2012-04-04 MED ORDER — ACETAMINOPHEN 650 MG RE SUPP: 650.0000 mg | Freq: Four times a day (QID) | RECTAL | Status: DC | PRN

## 2012-04-04 MED ORDER — VITAMIN B-1 100 MG PO TABS
100.0000 mg | ORAL_TABLET | Freq: Every day | ORAL | Status: DC
  Filled 2012-04-04: qty 1

## 2012-04-04 MED ORDER — LORAZEPAM 2 MG/ML IJ SOLN: 1.0000 mg | Freq: Four times a day (QID) | INTRAMUSCULAR | Status: AC | PRN

## 2012-04-04 MED ORDER — ETOMIDATE 2 MG/ML IV SOLN
20.0000 mg | Freq: Once | INTRAVENOUS | Status: AC
  Administered 2012-04-04: 20 mg via INTRAVENOUS

## 2012-04-04 MED ORDER — PHENOL 1.4 % MT LIQD
1.0000 | OROMUCOSAL | Status: DC | PRN
  Administered 2012-04-04 – 2012-04-05 (×2): 1 via OROMUCOSAL
  Filled 2012-04-04: qty 177

## 2012-04-04 MED ORDER — OXYCODONE-ACETAMINOPHEN 5-325 MG PO TABS
1.0000 | ORAL_TABLET | Freq: Four times a day (QID) | ORAL | Status: DC | PRN
  Administered 2012-04-04 – 2012-04-07 (×9): 1 via ORAL
  Filled 2012-04-04 (×9): qty 1

## 2012-04-04 MED ORDER — ADULT MULTIVITAMIN W/MINERALS CH
1.0000 | ORAL_TABLET | Freq: Every day | ORAL | Status: DC
  Administered 2012-04-05 – 2012-04-08 (×4): 1 via ORAL
  Filled 2012-04-04 (×5): qty 1

## 2012-04-04 MED ORDER — FENTANYL CITRATE 0.05 MG/ML IJ SOLN
50.0000 ug | Freq: Once | INTRAMUSCULAR | Status: AC
  Administered 2012-04-04: 50 ug via INTRAVENOUS

## 2012-04-04 MED ORDER — LORAZEPAM 2 MG/ML IJ SOLN
1.0000 mg | Freq: Four times a day (QID) | INTRAMUSCULAR | Status: DC | PRN
  Filled 2012-04-04 (×2): qty 1

## 2012-04-04 MED ORDER — FENTANYL CITRATE 0.05 MG/ML IJ SOLN
100.0000 ug | Freq: Once | INTRAMUSCULAR | Status: AC
  Administered 2012-04-04: 100 ug via INTRAVENOUS

## 2012-04-04 MED ORDER — FOLIC ACID 1 MG PO TABS
1.0000 mg | ORAL_TABLET | Freq: Every day | ORAL | Status: DC
  Administered 2012-04-04 – 2012-04-08 (×5): 1 mg via ORAL
  Filled 2012-04-04 (×5): qty 1

## 2012-04-04 MED ORDER — ACETAMINOPHEN 325 MG PO TABS
650.0000 mg | ORAL_TABLET | Freq: Four times a day (QID) | ORAL | Status: DC | PRN
  Administered 2012-04-04 – 2012-04-07 (×4): 650 mg via ORAL
  Filled 2012-04-04 (×2): qty 2
  Filled 2012-04-04: qty 1
  Filled 2012-04-04: qty 2

## 2012-04-04 MED ORDER — SODIUM CHLORIDE 0.9 % IV SOLN
25.0000 ug/h | INTRAVENOUS | Status: DC
  Administered 2012-04-04: 25 ug/h via INTRAVENOUS
  Filled 2012-04-04 (×3): qty 1

## 2012-04-04 MED ORDER — LORAZEPAM 2 MG/ML IJ SOLN
0.0000 mg | Freq: Four times a day (QID) | INTRAMUSCULAR | Status: DC
  Administered 2012-04-04: 2 mg via INTRAVENOUS

## 2012-04-04 MED ORDER — LORAZEPAM 1 MG PO TABS
0.0000 mg | ORAL_TABLET | Freq: Four times a day (QID) | ORAL | Status: AC
  Administered 2012-04-04: 2 mg via ORAL
  Administered 2012-04-05: 1 mg via ORAL
  Administered 2012-04-05: 2 mg via ORAL
  Filled 2012-04-04: qty 1
  Filled 2012-04-04 (×2): qty 2
  Filled 2012-04-04: qty 4

## 2012-04-04 MED ORDER — MIDAZOLAM HCL 2 MG/2ML IJ SOLN
INTRAMUSCULAR | Status: AC
  Filled 2012-04-04: qty 4

## 2012-04-04 MED ORDER — SODIUM CHLORIDE 0.9 % IJ SOLN
3.0000 mL | Freq: Two times a day (BID) | INTRAMUSCULAR | Status: DC
  Administered 2012-04-04 – 2012-04-08 (×8): 3 mL via INTRAVENOUS

## 2012-04-04 MED ORDER — ONDANSETRON HCL 4 MG/2ML IJ SOLN: 4.0000 mg | Freq: Three times a day (TID) | INTRAMUSCULAR | Status: DC | PRN

## 2012-04-04 MED ORDER — PROPOFOL 10 MG/ML IV EMUL
5.0000 ug/kg/min | INTRAVENOUS | Status: DC
  Administered 2012-04-04: 20 ug/kg/min via INTRAVENOUS

## 2012-04-04 MED ORDER — THIAMINE HCL 100 MG/ML IJ SOLN: 100.0000 mg | Freq: Every day | INTRAMUSCULAR | Status: DC

## 2012-04-04 MED ORDER — INSULIN ASPART 100 UNIT/ML ~~LOC~~ SOLN: 0.0000 [IU] | Freq: Three times a day (TID) | SUBCUTANEOUS | Status: DC

## 2012-04-04 MED ORDER — PHENYLEPHRINE HCL 10 MG/ML IJ SOLN
30.0000 ug/min | INTRAVENOUS | Status: DC
  Administered 2012-04-05: 30 ug/min via INTRAVENOUS
  Filled 2012-04-04: qty 4

## 2012-04-04 MED ORDER — LORAZEPAM 1 MG PO TABS
0.0000 mg | ORAL_TABLET | Freq: Two times a day (BID) | ORAL | Status: AC
  Administered 2012-04-06 – 2012-04-08 (×2): 1 mg via ORAL
  Filled 2012-04-04: qty 1

## 2012-04-04 MED ORDER — MIDAZOLAM HCL 2 MG/2ML IJ SOLN
3.0000 mg | Freq: Once | INTRAMUSCULAR | Status: AC
  Administered 2012-04-04: 3 mg via INTRAVENOUS

## 2012-04-04 MED ORDER — FOLIC ACID 1 MG PO TABS
1.0000 mg | ORAL_TABLET | Freq: Every day | ORAL | Status: DC
  Filled 2012-04-04: qty 1

## 2012-04-04 MED ORDER — PROPOFOL 10 MG/ML IV EMUL
INTRAVENOUS | Status: AC
  Administered 2012-04-04: 20 ug/kg/min via INTRAVENOUS
  Filled 2012-04-04: qty 100

## 2012-04-04 NOTE — Procedures (Signed)
Intubation Procedure Note [name] [id number] 01-12-1956  Procedure: Intubation Indications: Airway protection and maintenance  Procedure Details Consent: Risks of procedure as well as the alternatives and risks of each were explained to the (patient/caregiver).  Consent for procedure obtained. Time Out: Verified patient identification, verified procedure, site/side was marked, verified correct patient position, special equipment/implants available, medications/allergies/relevent history reviewed, required imaging and test results available.  Performed  Maximum sterile technique was used including antiseptics, cap, gloves, gown, hand hygiene, mask and sheet.  3 glidescope    Evaluation Hemodynamic Status: Transient hypotension treated with fluid; O2 sats: stable throughout Patient's Current Condition: stable Complications: No apparent complications Patient did tolerate procedure well. Chest X-ray ordered to verify placement.  CXR: pending.   [name] 04/04/2012

## 2012-04-04 NOTE — Clinical Social Work Note (Signed)
Clinical Social Worker received referral for patient request of advanced directives and medication delivery.  Patient was sleeping following extubation with no family currently at bedside.  CSW will follow up with patient and/or family once available or appropriate for assessment.    Macario Golds, [place] [phone]

## 2012-04-04 NOTE — Progress Notes (Signed)
Nursing 1645 Dr. Vassie Loll for CCM made aware of pt's lab results.  1unit PRBC ordered to be transfused and CBC post transfusion.  PRBC ordered and given.  Will continue to assess.

## 2012-04-04 NOTE — ED Notes (Signed)
MD at bedside. 

## 2012-04-04 NOTE — Procedures (Signed)
Extubation Procedure Note  Patient Details:   Name: [name] DOB: 03/20/1956 MRN: [id number]    Evaluation  O2 sats: stable throughout Complications: No apparent complications Patient did tolerate procedure well. Bilateral Breath Sounds: Clear;Diminished   Yes Pt extubated to [facility] per MD order after successful SBT.  Pt had + cuff leak.  Pt able to vocalize, will continue to monitor.    Genia Del Apple 04/04/2012, 12:43 PM

## 2012-04-04 NOTE — H&P (Addendum)
[name] is an 56 y.o. male.   Patient was seen and examined on April 04, 2012. PCP - is in [place]. Chief Complaint: Nausea vomiting and diarrhea. HPI: 56 year-old male with history of chronic alcoholism, hypertension and diabetes presented the ER because of persistent nausea vomiting and diarrhea. Patient states that a day ago patient had salad after which patient started having multiple episodes of nausea vomiting which were brown coffee ground in color. He also had at least 3-4 episodes of black bowel movement. Patient presented the ER and he initially was hypotensive which improved with 2 L of normal saline. Patient's hemoglobin has decreased to 6 from his usual of 9. Stool for occult blood that by ER physician Dr. Betsy Pries was positive and melanotic. At this time patient has been admitted for acute GI bleed.   Patient states he takes a lot of Goody powder for pain.  ER physician had consulted gastroenterologist on call and at this time patient will be admitted to hospitalist service.  Past Medical History  Diagnosis Date  . Neuropathy   . Diabetes mellitus   . Bipolar affect, depressed   . Hypertension   . Stroke     Mini stroke about 39yrs ago  . Arthritis     Past Surgical History  Procedure Date  . Fracture surgery     Leg and arm 33yrs ago    Family History  Problem Relation Age of Onset  . Hypotension Mother    Social History:  reports that he has been smoking Cigarettes.  He has a 15 pack-year smoking history. He has never used smokeless tobacco. He reports that he drinks about 51.6 ounces of alcohol per week. He reports that he uses illicit drugs (Cocaine).  Allergies: No Known Allergies   (Not in a hospital admission)  Results for orders placed during the hospital encounter of 04/03/12 (from the past 48 hour(s))  CBC WITH DIFFERENTIAL     Status: Abnormal   Collection Time   04/03/12  9:49 PM      Component Value Range Comment   WBC 5.8  4.0 - 10.5 K/uL     RBC 2.21 (*) 4.22 - 5.81 MIL/uL    Hemoglobin 6.2 (*) 13.0 - 17.0 g/dL    HCT 40.1 (*) 02.7 - 52.0 %    MCV 85.1  78.0 - 100.0 fL    MCH 28.1  26.0 - 34.0 pg    MCHC 33.0  30.0 - 36.0 g/dL    RDW 25.3 (*) 66.4 - 15.5 %    Platelets 79 (*) 150 - 400 K/uL PLATELET COUNT CONFIRMED BY SMEAR   Neutrophils Relative 66  43 - 77 %    Neutro Abs 3.8  1.7 - 7.7 K/uL    Lymphocytes Relative 20  12 - 46 %    Lymphs Abs 1.2  0.7 - 4.0 K/uL    Monocytes Relative 13 (*) 3 - 12 %    Monocytes Absolute 0.8  0.1 - 1.0 K/uL    Eosinophils Relative 1  0 - 5 %    Eosinophils Absolute 0.0  0.0 - 0.7 K/uL    Basophils Relative 0  0 - 1 %    Basophils Absolute 0.0  0.0 - 0.1 K/uL   COMPREHENSIVE METABOLIC PANEL     Status: Abnormal   Collection Time   04/03/12  9:49 PM      Component Value Range Comment   Sodium 130 (*) 135 - 145 mEq/L  Potassium 5.2 (*) 3.5 - 5.1 mEq/L    Chloride 94 (*) 96 - 112 mEq/L    CO2 21  19 - 32 mEq/L    Glucose, Bld 279 (*) 70 - 99 mg/dL    BUN 43 (*) 6 - 23 mg/dL    Creatinine, Ser 3.08 (*) 0.50 - 1.35 mg/dL    Calcium 7.9 (*) 8.4 - 10.5 mg/dL    Total Protein 5.2 (*) 6.0 - 8.3 g/dL    Albumin 2.5 (*) 3.5 - 5.2 g/dL    AST 34  0 - 37 U/L    ALT 18  0 - 53 U/L    Alkaline Phosphatase 61  39 - 117 U/L    Total Bilirubin 1.5 (*) 0.3 - 1.2 mg/dL    GFR calc non Af Amer 46 (*) >90 mL/min    GFR calc Af Amer 53 (*) >90 mL/min   LIPASE, BLOOD     Status: Normal   Collection Time   04/03/12  9:49 PM      Component Value Range Comment   Lipase 38  11 - 59 U/L   TYPE AND SCREEN     Status: Normal (Preliminary result)   Collection Time   04/03/12 10:45 PM      Component Value Range Comment   ABO/RH(D) O POS      Antibody Screen NEG      Sample Expiration 04/06/2012      Unit Number [id number]      Blood Component Type RED CELLS,LR      Unit division 00      Status of Unit ISSUED      Transfusion Status OK TO TRANSFUSE      Crossmatch Result Compatible       Unit Number [id number]      Blood Component Type RED CELLS,LR      Unit division 00      Status of Unit ALLOCATED      Transfusion Status OK TO TRANSFUSE      Crossmatch Result Compatible     PREPARE RBC (CROSSMATCH)     Status: Normal   Collection Time   04/03/12 10:45 PM      Component Value Range Comment   Order Confirmation ORDER PROCESSED BY BLOOD BANK     ABO/RH     Status: Normal (Preliminary result)   Collection Time   04/03/12 10:45 PM      Component Value Range Comment   ABO/RH(D) O POS     LACTIC ACID, PLASMA     Status: Abnormal   Collection Time   04/04/12 12:11 AM      Component Value Range Comment   Lactic Acid, Venous 3.4 (*) 0.5 - 2.2 mmol/L   PROTIME-INR     Status: Abnormal   Collection Time   04/04/12 12:11 AM      Component Value Range Comment   Prothrombin Time 20.4 (*) 11.6 - 15.2 seconds    INR 1.82 (*) 0.00 - 1.49    No results found.  Review of Systems  Constitutional: Negative.   HENT: Negative.   Eyes: Negative.   Respiratory: Negative.   Cardiovascular: Negative.   Gastrointestinal: Positive for nausea, vomiting and diarrhea.  Genitourinary: Negative.   Musculoskeletal: Negative.   Skin: Negative.   Neurological: Negative.   Endo/Heme/Allergies: Negative.   Psychiatric/Behavioral: Negative.     Blood pressure 106/40, pulse 102, temperature 98.5 F (36.9 C), temperature source Oral, resp. rate 18, SpO2 98.00%. Physical  Exam  Constitutional: He is oriented to person, place, and time. He appears well-developed and well-nourished. No distress.  HENT:  Head: Normocephalic and atraumatic.  Right Ear: External ear normal.  Left Ear: External ear normal.  Nose: Nose normal.  Mouth/Throat: Oropharynx is clear and moist. No oropharyngeal exudate.  Eyes: Conjunctivae normal are normal. Pupils are equal, round, and reactive to light. Right eye exhibits no discharge. Left eye exhibits no discharge. No scleral icterus.  Neck: Normal range of  motion. Neck supple.  Cardiovascular: Normal rate and regular rhythm.   Respiratory: Effort normal and breath sounds normal. No respiratory distress. He has no wheezes. He has no rales.  GI: Soft. Bowel sounds are normal. He exhibits no distension. There is no tenderness. There is no rebound.  Musculoskeletal: He exhibits no edema and no tenderness.  Neurological: He is alert and oriented to person, place, and time.       Moves all extremities.  Skin: Skin is warm and dry. He is not diaphoretic.     Assessment/Plan #1. Acute upper GI bleed - given patient's history of alcoholism there is a chance that this could be bleeding from varices. Since patient's also using Goody powder could be from PUD. Patient has been started on Protonix infusion and octreotide. Patient also has been started on 2 units of packed red blood cells. Closely follow CBC. Patient's lactic as mildly high. At this time patient's blood pressure is stable and if there is any further decrease in blood pressure then will transfer to ICU.Add empiric antibiotics. Further recommendations per gastroenterologist. #2. Acute blood loss anemia - see #1. #3. Thrombocytopenia and coagulopathy - patient does have chronic thrombocytopenia. His thrombocytopenia and coagulopathy is probably from cirrhosis of the liver. I have ordered one dose of vitamin K 5 mg IV. Recheck INR in a.m. and closely follow CBC for any further worsening of her platelet counts. #4. ARF - probably from dehydration and anemia from GI bleed. Closely follow intake output and metabolic panel. #5. Hypertension - since patient was initially hypotensive patient's antihypertensives will be placed on hold. #6. Diabetes mellitus type 2 - since patient is n.p.o. check CBG every 4 hourly with sliding-scale coverage. #7. Polysubstance abuse including alcohol cocaine and cigarettes - patient will be placed on CIWA protocol. Advised to quit all habits.  If patient's blood pressure  drops again then we'll consult the critical care.  CODE STATUS - full code.  Kearstyn Avitia N. 04/04/2012, 1:32 AM Addendum - Patients blood pressure is around 85 to 90 systolics. I am ordering one more unit of PRBC and 500 cc NS bolus. Since patient became mildly febrile will give tylenol and ordered CXR and UA. I did discuss with Dr.Brodie gastroenterologist. Will consult critical care.  Midge Minium

## 2012-04-04 NOTE — OR Nursing (Signed)
During EGD with Dr. Marina Goodell, patients vitals and assessments were monitored Q5 minutes  per ICU RN, Shanda Bumps.

## 2012-04-04 NOTE — Progress Notes (Addendum)
EGD: SEE PENTAX REPORT. CLEAN BASED NON-BLEEDING GASTRIC AND DUODENAL ULCERS W/O BLEEDING. INCIDENTAL SMALL ESOPHAGEAL VARICES. NO THERAPY NEEDED.  SEE RECOMMENDATIONS. WILL SIGN OFF.

## 2012-04-04 NOTE — Progress Notes (Signed)
Nursing 1100 RN at bedside for EGD procedure.  Vecuronium 8mg  IVp given prior to procedure per NP order.  Vital signs documented in record.  Sandostatin gtt d/c per MD.  Erythromycin IVPB dose d/c per MD.  NP to bedside after procedure.  Propofol gtt turned off.  Patient extubated at 1230 per NP order.  Will wean Neo gtt to keep MAP>65.

## 2012-04-04 NOTE — Progress Notes (Signed)
Nursing 1530 Dr. Vassie Loll made aware that patient is requiring increasing doses of Neo gtt to maintain a MAP>65.  Will draw CBC now to f/u from earlier transfusions today.  Will continue to assess.

## 2012-04-04 NOTE — Progress Notes (Signed)
Inpatient Diabetes Program Recommendations  AACE/ADA: New Consensus Statement on Inpatient Glycemic Control (2013)  Target Ranges:  Prepandial:   less than 140 mg/dL      Peak postprandial:   less than 180 mg/dL (1-2 hours)      Critically ill patients:  140 - 180 mg/dL   Reason for Visit: Hyperglycemia  Inpatient Diabetes Program Recommendations Insulin - Basal: Pt takes lantus 30 units at HS at home.   Please order at least 1/2 home dose- 15 units lantus to start. Correction (SSI): Once eating, change correction to tidwc and HS scale.  Note: Thank you, Lenor Coffin, RN, CNS, Diabetes Coordinator [phone])

## 2012-04-04 NOTE — Op Note (Addendum)
[facility] [address] [place], [address]   ENDOSCOPY PROCEDURE REPORT  PATIENT: Bonnie, Riccelli  MR#: [id number] BIRTHDATE: 1956/01/21 , 56  yrs. old GENDER: Male ENDOSCOPIST: Roxy Cedar, MD REFERRED BY:  [facility] PROCEDURE DATE:  04/04/2012 PROCEDURE:  EGD w/ biopsy ASA CLASS:     Class III INDICATIONS:  hematemesis.   melena. MEDICATIONS: Per RN report TOPICAL ANESTHETIC: none  DESCRIPTION OF PROCEDURE: After the risks benefits and alternatives of the procedure were thoroughly explained, informed consent was obtained.  The Pentax Gastroscope [id number] endoscope was introduced through the mouth and advanced to the second portion of the duodenum. Without limitations.  The instrument was slowly withdrawn as the mucosa was fully examined.      Small nonbleeding distal varices without stigmata.  Mutiple clean based antral ulcers.  Otherwise normal stomach.  Small Clean based bulbar ulcer.  otherwise normal duodenum.  No blood or active bleeding.  CLO BX taken.  Retroflexed views revealed no abnormalities.     The scope was then withdrawn from the patient and the procedure completed.  COMPLICATIONS: There were no complications. ENDOSCOPIC IMPRESSION: 1.  Small nonbleeding distal varices without stigmata. 2.  Mutiple clean based antral ulcers. and Small Clean based bulbar ulcer. Felt to be cause for bleeding. Low risk for rebleed 3.  No blood or active bleeding.  CLO BX taken  RECOMMENDATIONS: 1.  continue PPI 2.  avoid NSAIDS and all ETOH 3.  Rx CLO if positive 4. Stop octreotide 5. Advance diet as tolerated after extubation  WILL SIGN OFF.  REPEAT EXAM:  Activated:  04/04/2012 11:35 AM   CC:  PATIENT NAME:  Dushawn, Sark MR#: [id number]

## 2012-04-04 NOTE — Consult Note (Signed)
[facility] Consult: 8:27 AM 04/04/2012   Referring Provider: Toniann Fail Primary Care Physician:   Primary Gastroenterologist:   Reason for Consultation:  Cg emesis, acute blood loss anemia.   HPI: [name] is a 56 y.o. male.  Hx htn, IDDM,  bipolar d/o, detached retina, alcoholism.  No documented hx of liver disease.  Seen in ED 11/6 after etoh and cocaine binge.  Hgb was 9.4 Seen 11/10 to get refills on his medications. Hgb was 8.9.  Was treated with fluids.  Refused to leave ED initially as he wanted admission to [facility] for detox. He was refused admission due to not being medically cleared.   Hgb has been as low as 8.9 on 9/19 and as high as 10.8 on 10/26.  04/03/12 in PM became extremely dizzy, gait was unstable and had multiple episodes of cg vomitting and melenic black, tarry stool.  His dog brought him the phone after he fell, pt called 911.  In  ED Hgb was 6.2.  Admitted, started on Protonix and Octreotide drips, Rocephin, CIWA protocol, Vitamin K, IVF, PRBC transfusion.   He drinks 12 pack beer per day, used cocaine last week, takes 3 Goody's powders daily.     Past Medical History  Diagnosis Date  . Neuropathy   . Diabetes mellitus   . Bipolar affect, depressed   . Hypertension   . Stroke     Mini stroke about 74yrs ago  . Arthritis     Past Surgical History  Procedure Date  . Fracture surgery     Leg and arm 65yrs ago    Prior to Admission medications   Medication Sig Start Date End Date Taking? Authorizing Provider  albuterol (PROVENTIL HFA;VENTOLIN HFA) 108 (90 BASE) MCG/ACT inhaler Inhale 2 puffs into the lungs every 6 (six) hours as needed. For shortness of breath   Yes Historical Provider, MD  aspirin EC 81 MG tablet Take 81 mg by mouth daily.     Yes Historical Provider, MD  cholecalciferol (VITAMIN D) 1000 UNITS tablet Take 1,000 Units by mouth daily.   Yes Historical Provider, MD  diazepam (VALIUM) 5 MG  tablet Take 1 tablet (5 mg total) by mouth 2 (two) times daily. 04/02/12  Yes [name], MD  Diphenhydramine-APAP, sleep, (GOODY PM PO) Take 1 packet by mouth daily as needed. For pain   Yes Historical Provider, MD  furosemide (LASIX) 20 MG tablet Take 20 mg by mouth daily.   Yes Historical Provider, MD  gabapentin (NEURONTIN) 400 MG capsule Take 400 mg by mouth 3 (three) times daily.   Yes Historical Provider, MD  insulin glargine (LANTUS) 100 UNIT/ML injection Inject 30 Units into the skin at bedtime.   Yes Historical Provider, MD  lisinopril (PRINIVIL,ZESTRIL) 20 MG tablet Take 0.5 tablets (10 mg total) by mouth daily. 04/01/12  Yes Angus Seller, PA  Multiple Vitamins-Minerals (MULTIVITAMINS THER. W/MINERALS) TABS Take 1 tablet by mouth daily. 04/12/11  Yes Mechele Dawley, NP  oxyCODONE-acetaminophen (PERCOCET/ROXICET) 5-325 MG per tablet Take 1 tablet by mouth every 6 (six) hours as needed. For pain   Yes Historical Provider, MD  POTASSIUM PO Take 1 tablet by mouth daily. Over the counter potassium tablet-pt unsure of strength   Yes Historical Provider, MD    Scheduled Meds:    . sodium chloride   Intravenous STAT  . cefTRIAXone (ROCEPHIN)  IV  1 g Intravenous Q24H  . folic acid  1 mg Oral Daily  . insulin aspart  0-9 Units Subcutaneous Q4H  . LORazepam  0-4 mg Intravenous Q6H   Followed by  . LORazepam  0-4 mg Intravenous Q12H  . multivitamin with minerals  1 tablet Oral Daily  . [COMPLETED] ondansetron (ZOFRAN) IV  4 mg Intravenous Once  . [COMPLETED] pantoprazole (PROTONIX) IV  80 mg Intravenous Once  . [COMPLETED] phytonadione (VITAMIN K) IV  5 mg Intravenous Once  . [COMPLETED] sodium chloride  1,000 mL Intravenous Once  . [COMPLETED] sodium chloride  1,000 mL Intravenous Once  . sodium chloride  3 mL Intravenous Q12H  . [COMPLETED] sodium polystyrene  30 g Oral Once  . thiamine  100 mg Oral Daily   Or  . thiamine  100 mg Intravenous Daily   Infusions:  PRN  Meds: acetaminophen, acetaminophen, albuterol, LORazepam, LORazepam, ondansetron (ZOFRAN) IV, ondansetron, [DISCONTINUED] ondansetron (ZOFRAN) IV   Allergies as of 04/03/2012  . (No Known Allergies)    Family History  Problem Relation Age of Onset  . Hypotension Mother     History   Social History  . Marital Status: Divorced    Spouse Name: N/A    Number of Children: N/A  . Years of Education: N/A   Occupational History  . Disabled.  Formerly ran Scientist, physiological    Social History Main Topics  . Smoking status: Current Every Day Smoker -- 0.5 packs/day for 30 years    Types: Cigarettes  . Smokeless tobacco: Never Used  . Alcohol Use: 51.6 oz/week    86 Cans of beer per week     Comment: heavy (approx 12/day)  . Drug Use: Yes    Special: Cocaine     Comment: last use 03/27/2012 @2100   . Sexually Active: Yes    Birth Control/ Protection: None   Other Topics Concern  . Not on file   Social History Narrative  . Lives with his dog.  Has a brother with whom he is close.  However that same brother provided the cocaine he used 11/5    REVIEW OF SYSTEMS: Constitutional:  No weight loss.   ENT:  No epistaxis Pulm:  No cough or dyspnea CV:  No palpitations or chest pain.  No extremity edema GU:  No hematuris GI:  No dysphagia, anorexia, heartburn. Heme:  Pt unaware of having anemia.    Transfusions:  None befor Neuro:  Headaches, painful neuropathy in his feet.  Feels anxious a lot. Derm:  Dry scaly feet that bleed a little bit.  No diabetic foot ulcers Endocrine:  No excessive urination , thirst, sweats, chills Immunization:  Not queried Travel:  None.    PHYSICAL EXAM: Vital signs in last 24 hours: Temp:  [98.1 F (36.7 C)-100 F (37.8 C)] 99.8 F (37.7 C) (11/13 0805) Pulse Rate:  [85-106] 85  (11/13 0805) Resp:  [17-29] 18  (11/13 0805) BP: (79-121)/(16-85) 96/39 mmHg (11/13 0805) SpO2:  [98 %-100 %] 99 % (11/13 0800) FiO2 (%):  [21  %] 21 % (11/13 0322) Weight:  [105.2 kg (231 lb 14.8 oz)] 105.2 kg (231 lb 14.8 oz) (11/13 0300)  General: pleasant, talkative, somewhat ill looking WM.  No distress Head:  No trauma or swellling  Eyes:  No icterus, no pallor, EOMI, post cataract changes to pupils Ears:  Not HOH  Nose:  No discharge or bleeding Mouth:  Moist, pink, clear MM.  Fir dentition. Neck:  No mass or TMG Lungs:  Clear B.  Hoarse vocals. No cough Heart: RRR.  No  MRG Abdomen:  Soft, no HSM.  Active BS.  NT.  No obvious ascites.   Rectal: deferred   Musc/Skeltl: no joint swelling or erythema Extremities:  Slight non-pitting pedal edema.   Neurologic:  Oriented x 3.  Pressured speech, tangential thought process.  Skin:   Scaling patches of skin on dorsum of feet.  Tattoos:  None seen Nodes:  No cervical adenopathy.   Psych:  Pleasant, mildly agitated vut able to refocus.  Intake/Output from previous day: 11/12 0701 - 11/13 0700 In: 1171.3 [I.V.:498.8; Blood:672.5] Out: -  Intake/Output this shift: Total I/O In: 450 [I.V.:137.5; Blood:262.5; IV Piggyback:50] Out: -   LAB RESULTS:  Basename 04/03/12 2149 04/02/12 1325  WBC 5.8 3.3*  HGB 6.2* 9.7*  HCT 18.8* 29.4*  PLT 79* 40*   BMET Lab Results  Component Value Date   NA 130* 04/03/2012   NA 130* 04/02/2012   NA 133* 04/01/2012   K 5.2* 04/03/2012   K 3.8 04/02/2012   K 3.8 04/01/2012   CL 94* 04/03/2012   CL 97 04/02/2012   CL 100 04/01/2012   CO2 21 04/03/2012   CO2 24 04/02/2012   CO2 21 04/01/2012   GLUCOSE 279* 04/03/2012   GLUCOSE 286* 04/02/2012   GLUCOSE 256* 04/01/2012   BUN 43* 04/03/2012   BUN 11 04/02/2012   BUN 9 04/01/2012   CREATININE 1.62* 04/03/2012   CREATININE 0.75 04/02/2012   CREATININE 0.93 04/01/2012   CALCIUM 7.9* 04/03/2012   CALCIUM 8.5 04/02/2012   CALCIUM 8.1* 04/01/2012   LFT  Basename 04/03/12 2149 04/02/12 1325  PROT 5.2* 6.8  ALBUMIN 2.5* 3.0*  AST 34 41*  ALT 18 21  ALKPHOS 61 92  BILITOT  1.5* 1.5*  BILIDIR -- --  IBILI -- --   PT/INR Lab Results  Component Value Date   INR 1.82* 04/04/2012   Drugs of Abuse     Component Value Date/Time   LABOPIA NONE DETECTED 03/28/2012 0434   COCAINSCRNUR POSITIVE* 03/28/2012 0434   LABBENZ NONE DETECTED 03/28/2012 0434   AMPHETMU NONE DETECTED 03/28/2012 0434   THCU NONE DETECTED 03/28/2012 0434   LABBARB NONE DETECTED 03/28/2012 0434     RADIOLOGY STUDIES: Dg Chest Port 1 View 04/04/2012  *RADIOLOGY REPORT*  Clinical Data: Syncope  PORTABLE CHEST - 1 VIEW  Comparison: 02/09/2012  Findings: Lungs are clear. No pleural effusion or pneumothorax.  Cardiomediastinal silhouette is within normal limits.  IMPRESSION: No evidence of acute cardiopulmonary disease.   Original Report Authenticated By: Charline Bills, M.D.     ENDOSCOPIC STUDIES: none  IMPRESSION: *  UGI bleed. With hypotension, near syncope. On Protonix IV drip, Octreotide drip, Rocephin on board.  Rule out Goodies induced ulcer, r/o variceal  Bleed.  *  ABL anemia.  3rd unit of PRBC is near completion.  *  Bipolar d/o *  Alcoholism *  IDDM.  Type 2.  *  Polysubstance abuse. *  Coagulopathy. S/p 5mg  IV Vitamin K This raises question of cirrhosis, liver disease *  Slight increase in T bili and AST.   PLAN: *  EGD today *  CCM will intubate, give Propofol, place trauma catheter for large bore access, give 2 of FFP.   LOS: 1 day   Jennye Moccasin  04/04/2012, 8:27 AM Pager: [phone]  GI ATTENDING  HX, LABS, X-RAYS REVIEWED. PATIENT SEEN AND EXAMINED. AGREE WITH ABOVE. PATIENT WITH BIPOLAR DISORDER AND POLYSUBSTANCE ABOVE PRESENTS WITH ACUTE UGI BLEED (HEMATEMESIS AND MELENA), HYPOTENSION, ANEMIA. APPEARS  TO HAVE SOME DEGREE OF ALCOHOLIC LIVER DISEASE. TAKES NSAIDS. COULD HAVE VARICEAL OR NON-VARICEAL BLEEDING SOURCE. AGREE WITH, BLOOD, PPI, ANTIBIOTICS, OCTREOTIDE. WILL ELECTIVELY INTUBATE TO SECURE AIRWAY AND PROCEED WITH EMERGENT ENDOSCOPY. PATIENT HIGH RISK.  Wilhemina Bonito. Eda Keys., M.D. [facility] Division of Gastroenterology

## 2012-04-04 NOTE — Progress Notes (Signed)
eLink Physician-Brief Progress Note Patient Name: [name] DOB: April 21, 1956 MRN: [id number]  Date of Service  04/04/2012   HPI/Events of Note     eICU Interventions  Resume home percocet for pain   Intervention Category Minor Interventions: Routine modifications to care plan (e.g. PRN medications for pain, fever)  ALVA,RAKESH V. 04/04/2012, 6:13 PM

## 2012-04-04 NOTE — Procedures (Signed)
Central Venous Catheter Insertion Procedure Note RISHITH SIDDOWAY [id number] [birth year]  Procedure: Insertion of Central Venous Catheter Indications: Drug and/or fluid administration and Frequent blood sampling  Procedure Details Consent: Risks of procedure as well as the alternatives and risks of each were explained to the (patient/caregiver).  Consent for procedure obtained. Time Out: Verified patient identification, verified procedure, site/side was marked, verified correct patient position, special equipment/implants available, medications/allergies/relevent history reviewed, required imaging and test results available.  Performed  Maximum sterile technique was used including antiseptics, cap, gloves, gown, hand hygiene, mask and sheet. Skin prep: Chlorhexidine; local anesthetic administered A antimicrobial bonded/coated triple lumen catheter was placed in the right internal jugular vein using the Seldinger technique.  Evaluation Blood flow good Complications: No apparent complications Patient did tolerate procedure well. Chest X-ray ordered to verify placement.  CXR: pending.  U/S used in placement.  [name] 04/04/2012, 10:50 AM  Patient seen and examined, agree with above note.  I dictated the care and orders written for this patient under my direction.  Koren Bound, M.D. [phone]

## 2012-04-04 NOTE — Consult Note (Signed)
PULMONARY  / CRITICAL CARE MEDICINE  Name: [name] MRN: [id number] DOB: 1955/06/05    LOS: 1  REFERRING MD :  Toniann Fail ([facility])  CHIEF COMPLAINT:  GI bleed  BRIEF PATIENT DESCRIPTION:  56 yo male with hx ETOH, HTN, DM admitted 11/13 by [facility] with GI bleed.  PCCM consulted for hypotension.   LINES / TUBES: none  CULTURES: none  ANTIBIOTICS: Rocephin 11/13>>>  SIGNIFICANT EVENTS:    LEVEL OF CARE:  ICU PRIMARY SERVICE:  [facility] CONSULTANTS:  GI CODE STATUS: full DIET:  NPO DVT Px:  SCD GI Px:  protonix  HISTORY OF PRESENT ILLNESS:  56yo male with significant hx ETOH, Bipolar, HTN, DM presented 11/13 with 2 day hx nausea, vomiting coffee ground emesis and black tarry stools.  States he takes Goody's powder frequently. He was hypotensive in ER but initially improved with fluids.  Initial hgb ~6.  On arrival to ICU had persistent hypotension and PCCM consulted.   PAST MEDICAL HISTORY :  Past Medical History  Diagnosis Date  . Neuropathy   . Diabetes mellitus   . Bipolar affect, depressed   . Hypertension   . Stroke     Mini stroke about 30yrs ago  . Arthritis    Past Surgical History  Procedure Date  . Fracture surgery     Leg and arm 45yrs ago   Prior to Admission medications   Medication Sig Start Date End Date Taking? Authorizing Provider  albuterol (PROVENTIL HFA;VENTOLIN HFA) 108 (90 BASE) MCG/ACT inhaler Inhale 2 puffs into the lungs every 6 (six) hours as needed. For shortness of breath   Yes Historical Provider, MD  aspirin EC 81 MG tablet Take 81 mg by mouth daily.     Yes Historical Provider, MD  cholecalciferol (VITAMIN D) 1000 UNITS tablet Take 1,000 Units by mouth daily.   Yes Historical Provider, MD  diazepam (VALIUM) 5 MG tablet Take 1 tablet (5 mg total) by mouth 2 (two) times daily. 04/02/12  Yes [name], MD  Diphenhydramine-APAP, sleep, (GOODY PM PO) Take 1 packet by mouth daily as needed. For pain   Yes Historical Provider, MD    furosemide (LASIX) 20 MG tablet Take 20 mg by mouth daily.   Yes Historical Provider, MD  gabapentin (NEURONTIN) 400 MG capsule Take 400 mg by mouth 3 (three) times daily.   Yes Historical Provider, MD  insulin glargine (LANTUS) 100 UNIT/ML injection Inject 30 Units into the skin at bedtime.   Yes Historical Provider, MD  lisinopril (PRINIVIL,ZESTRIL) 20 MG tablet Take 0.5 tablets (10 mg total) by mouth daily. 04/01/12  Yes Angus Seller, PA  Multiple Vitamins-Minerals (MULTIVITAMINS THER. W/MINERALS) TABS Take 1 tablet by mouth daily. 04/12/11  Yes Mechele Dawley, NP  oxyCODONE-acetaminophen (PERCOCET/ROXICET) 5-325 MG per tablet Take 1 tablet by mouth every 6 (six) hours as needed. For pain   Yes Historical Provider, MD  POTASSIUM PO Take 1 tablet by mouth daily. Over the counter potassium tablet-pt unsure of strength   Yes Historical Provider, MD   No Known Allergies  FAMILY HISTORY:  Family History  Problem Relation Age of Onset  . Hypotension Mother    SOCIAL HISTORY:  reports that he has been smoking Cigarettes.  He has a 15 pack-year smoking history. He has never used smokeless tobacco. He reports that he drinks about 51.6 ounces of alcohol per week. He reports that he uses illicit drugs (Cocaine).  REVIEW OF SYSTEMS:  Per HPI.  All other systems  reviewed and were neg.    VITAL SIGNS: Temp:  [98.1 F (36.7 C)-100 F (37.8 C)] 98.9 F (37.2 C) (11/13 0750) Pulse Rate:  [89-106] 89  (11/13 0750) Resp:  [17-29] 22  (11/13 0750) BP: (79-121)/(16-85) 108/35 mmHg (11/13 0750) SpO2:  [98 %-100 %] 99 % (11/13 0700) FiO2 (%):  [21 %] 21 % (11/13 0322) Weight:  [231 lb 14.8 oz (105.2 kg)] 231 lb 14.8 oz (105.2 kg) (11/13 0300) HEMODYNAMICS:   INTAKE / OUTPUT: Intake/Output      11/12 0701 - 11/13 0700 11/13 0701 - 11/14 0700   I.V. (mL/kg) 498.8 (4.7)    Blood 672.5 12.5   Total Intake(mL/kg) 1171.3 (11.1) 12.5 (0.1)   Net +1171.3 +12.5        Urine Occurrence 1 x       PHYSICAL EXAMINATION: General:  wdwn male, NAD  Neuro:  Awake, alert, appropriate, MAE HEENT:  Mm dry, no JVD Cardiovascular:  s1s2 rrr Lungs:  resps even non labored on [place], CTA Abdomen:  Soft, hypoactive bs, mildly tender diffusely  Ext: warm and dry, no edema    LABS:  Lab 04/04/12 0011 04/03/12 2149 04/02/12 1325 04/01/12 0348  HGB -- 6.2* 9.7* 8.9*  WBC -- 5.8 3.3* 4.9  PLT -- 79* 40* 45*  NA -- 130* 130* 133*  K -- 5.2* 3.8 --  CL -- 94* 97 100  CO2 -- 21 24 21   GLUCOSE -- 279* 286* 256*  BUN -- 43* 11 9  CREATININE -- 1.62* 0.75 0.93  CALCIUM -- 7.9* 8.5 8.1*  MG -- -- -- --  PHOS -- -- -- --  AST -- 34 41* --  ALT -- 18 21 --  ALKPHOS -- 61 92 --  BILITOT -- 1.5* 1.5* --  PROT -- 5.2* 6.8 --  ALBUMIN -- 2.5* 3.0* --  APTT -- -- -- --  INR 1.82* -- -- --  LATICACIDVEN 3.4* -- -- --  TROPONINI -- -- -- --  PROCALCITON -- -- -- --  PROBNP -- -- -- --  O2SATVEN -- -- -- --  PHART -- -- -- --  PCO2ART -- -- -- --  PO2ART -- -- -- --    Lab 04/04/12 0531 04/02/12 1806 04/02/12 1300 04/02/12 0854 04/01/12 2311  GLUCAP 238* 278* 245* 173* 227*    IMAGING:  ECG:  DIAGNOSES: Principal Problem:  *Acute GI bleeding Active Problems:  Diabetes mellitus type 2 with complications  HTN (hypertension)  Diabetes mellitus  ARF (acute renal failure)   ASSESSMENT / PLAN:  PULMONARY  ASSESSMENT: No acute issue   PLAN:   Monitor resp status with aggressive volume resuscitation and mult blood products   CARDIOVASCULAR  ASSESSMENT:  Hypotension/ hypovolemia  Hx HTN   PLAN:  Cont volume resuscitation with blood products as needed  Hold home anti-HTN F/u lactate   RENAL  ASSESSMENT:   Acute renal insufficiency - in setting volume depletion +/- mult NSAIDS  PLAN:   Fluids F/u chem    GASTROINTESTINAL  ASSESSMENT:   Acute GI bleeding  ETOH  PLAN:   GI to see  Blood products as needed  Cont octreotide gtt  Thiamine, folate, mvi    HEMATOLOGIC  ASSESSMENT:   Acute blood loss anemia   PLAN:  Trend cbc q4 F/u coags    INFECTIOUS  ASSESSMENT:   No evidence acute infection   PLAN:   Empiric abx for ?PUD Trend fever, wbx curve    ENDOCRINE  ASSESSMENT:   DM   PLAN:   SSI   NEUROLOGIC  ASSESSMENT:   ETOH  Bipolar   PLAN:   CIWA protocol  Watch for withdrawal  No antipsychotics at home  Likely needs psych consult   Danford Bad, NP 04/04/2012  8:23 AM Pager: [phone] or [phone]  I have personally obtained a history, examined the patient, evaluated laboratory and imaging results, formulated the assessment and plan and placed orders.  Pulmonary and Critical Care Medicine [facility] Pager: [phone]  04/04/2012, 8:05 AM   Patient intubated for airway protection for EGD then extubated afterwards.  BP improved with blood transfusion and fluid resuscitation.  Will hold in the ICU overnight and will likely be ready to transfer out and give back to [facility] in AM.  CRITICAL CARE: The patient is critically ill with multiple organ systems failure and requires high complexity decision making for assessment and support, frequent evaluation and titration of therapies, application of advanced monitoring technologies and extensive interpretation of multiple databases. Critical Care Time devoted to patient care services described in this note is 45 minutes.   Patient seen and examined, agree with above note.  I dictated the care and orders written for this patient under my direction.  Koren Bound, M.D. [phone]

## 2012-04-04 NOTE — Progress Notes (Addendum)
INITIAL ADULT NUTRITION ASSESSMENT Date: 04/04/2012   Time: 10:00 AM Reason for Assessment: MST  INTERVENTION: Plan to supplement diet as needed.  DOCUMENTATION CODES Per approved criteria  -Obesity Unspecified    ASSESSMENT: Male 56 y.o.  Dx: Acute GI bleeding  Hx:  Past Medical History  Diagnosis Date  . Neuropathy   . Diabetes mellitus   . Bipolar affect, depressed   . Hypertension   . Stroke     Mini stroke about 31yrs ago  . Arthritis    Past Surgical History  Procedure Date  . Fracture surgery     Leg and arm 44yrs ago   Related Meds:  Scheduled Meds:   . sodium chloride   Intravenous STAT  . cefTRIAXone (ROCEPHIN)  IV  1 g Intravenous Q24H  . erythromycin  250 mg Intravenous Once  . fentaNYL      . folic acid  1 mg Oral Daily  . insulin aspart  0-9 Units Subcutaneous Q4H  . LORazepam  0-4 mg Intravenous Q6H   Followed by  . LORazepam  0-4 mg Intravenous Q12H  . midazolam      . multivitamin with minerals  1 tablet Oral Daily  . [COMPLETED] ondansetron (ZOFRAN) IV  4 mg Intravenous Once  . [COMPLETED] pantoprazole (PROTONIX) IV  80 mg Intravenous Once  . [COMPLETED] phytonadione (VITAMIN K) IV  5 mg Intravenous Once  . [COMPLETED] sodium chloride  1,000 mL Intravenous Once  . [COMPLETED] sodium chloride  1,000 mL Intravenous Once  . sodium chloride  500 mL Intravenous Once  . sodium chloride  3 mL Intravenous Q12H  . [COMPLETED] sodium polystyrene  30 g Oral Once  . thiamine  100 mg Oral Daily   Or  . thiamine  100 mg Intravenous Daily  . [DISCONTINUED] insulin aspart  0-9 Units Subcutaneous TID WC   Continuous Infusions:   . sodium chloride 100 mL/hr at 04/04/12 0800  . octreotide (SANDOSTATIN) infusion 25 mcg/hr (04/04/12 0800)  . pantoprozole (PROTONIX) infusion 8 mg/hr (04/04/12 0800)   PRN Meds:.acetaminophen, acetaminophen, albuterol, LORazepam, LORazepam, ondansetron (ZOFRAN) IV, ondansetron, [DISCONTINUED] ondansetron (ZOFRAN) IV  Ht:  5\' 10"  (177.8 cm)  Wt: 231 lb 14.8 oz (105.2 kg)  Ideal Wt: 75.4 kg % Ideal Wt: 140%  Usual Wt:  Wt Readings from Last 10 Encounters:  04/04/12 231 lb 14.8 oz (105.2 kg)  04/04/12 231 lb 14.8 oz (105.2 kg)  03/28/12 243 lb (110.224 kg)  03/15/12 229 lb (103.874 kg)  04/07/11 241 lb (109.317 kg)  04/07/11 230 lb (104.327 kg)   % Usual Wt: 100%  Body mass index is 33.28 kg/(m^2). Obesity Class I  Food/Nutrition Related Hx: Heavy ETOH, 24 beers per day  Labs:  CMP     Component Value Date/Time   NA 130* 04/03/2012 2149   K 5.2* 04/03/2012 2149   CL 94* 04/03/2012 2149   CO2 21 04/03/2012 2149   GLUCOSE 279* 04/03/2012 2149   BUN 43* 04/03/2012 2149   CREATININE 1.62* 04/03/2012 2149   CALCIUM 7.9* 04/03/2012 2149   PROT 5.2* 04/03/2012 2149   ALBUMIN 2.5* 04/03/2012 2149   AST 34 04/03/2012 2149   ALT 18 04/03/2012 2149   ALKPHOS 61 04/03/2012 2149   BILITOT 1.5* 04/03/2012 2149   GFRNONAA 46* 04/03/2012 2149   GFRAA 53* 04/03/2012 2149   CBG (last 3)   Basename 04/04/12 0827 04/04/12 0531 04/02/12 1806  GLUCAP 179* 238* 278*    Intake/Output Summary (Last 24 hours)  at 04/04/12 1003 Last data filed at 04/04/12 0900  Gross per 24 hour  Intake 1634.75 ml  Output      0 ml  Net 1634.75 ml   Diet Order: NPO  Supplements/Tube Feeding: none  IVF:    sodium chloride Last Rate: 100 mL/hr at 04/04/12 0800  octreotide (SANDOSTATIN) infusion Last Rate: 25 mcg/hr (04/04/12 0800)  pantoprozole (PROTONIX) infusion Last Rate: 8 mg/hr (04/04/12 0800)   Pt admitted for UGI bleed. Work up underway. Plans for EGD today. CCM currently in room to intubate pt for EGD. Pt has now been extuabed. Spoke with pt who reports that his weight fluctuates from 232 lb to 245 lb based on how much swelling he has on his feet/legs. Reports taking lasix at home. Pt with hx of ETOH, has had periods of being sober but recently started to drink again. Does state that he eats 3 meals per day  despite drinking because food sobers him up some so that he can drink more ETOH.    Estimated Nutritional Needs:   Kcal:  2100-2300 Protein:  105-115 grams Fluid:  >2 L/day  NUTRITION DIAGNOSIS: Inadequate oral intake r/t inability to eat AEB NPO status; progressing.   MONITORING/EVALUATION(Goals): Goal: Pt to meet >/= 90% of their estimated nutrition needs.  EDUCATION NEEDS: -No education needs identified at this time  Kendell Bane RD, LDN, CNSC [phone] Pager [phone] After Hours Pager  04/04/2012, 10:00 AM

## 2012-04-04 NOTE — Significant Event (Signed)
Discussed EGD findings w/ Dr Marina Goodell. Pt tolerated well. Now weaning diprivan to off. Placed on SBT. F/Vt suggest adequate pulmonary compliance.   Imp 1) UGIB 2) h/o ETOH and mult meds 3) acute blood loss anemia 4) weaning  Plan -extubate to n/c when a little more awake -BID PPI -adv diet per GI -close obs for w/d    Anders Simmonds ACNP-BC [facility] Pulmonary/Critical Care Pager # [phone] OR # [phone] if no answer

## 2012-04-04 NOTE — Progress Notes (Signed)
eLink Physician-Brief Progress Note Patient Name: [name] DOB: 02-11-1956 MRN: [id number]  Date of Service  04/04/2012   HPI/Events of Note     eICU Interventions  1 U PRBC for Hb 6.8 & shock   Intervention Category Intermediate Interventions: Bleeding - evaluation and treatment with blood products  ALVA,RAKESH V. 04/04/2012, 4:54 PM

## 2012-04-04 NOTE — Progress Notes (Signed)
Nursing 0930 Patient prepared for intubation and central line placement.  Consents obtained.  Medications given per Anders Simmonds, NP during intubation and central line placement.  See MAR for medication administration.  Neo gtt ordered and started for low BP, goal of MAP>65.  Will continue Propofol gtt until EGD is complete.  Central line confirmed on PCXR by Anders Simmonds, NP.  See vital signs documented in record.

## 2012-04-05 ENCOUNTER — Encounter (HOSPITAL_COMMUNITY): Payer: Self-pay | Admitting: Internal Medicine

## 2012-04-05 DIAGNOSIS — M7989 Other specified soft tissue disorders: Secondary | ICD-10-CM

## 2012-04-05 DIAGNOSIS — M79609 Pain in unspecified limb: Secondary | ICD-10-CM

## 2012-04-05 DIAGNOSIS — N179 Acute kidney failure, unspecified: Secondary | ICD-10-CM

## 2012-04-05 DIAGNOSIS — K269 Duodenal ulcer, unspecified as acute or chronic, without hemorrhage or perforation: Secondary | ICD-10-CM

## 2012-04-05 LAB — COMPREHENSIVE METABOLIC PANEL
AST: 106 U/L — ABNORMAL HIGH (ref 0–37)
Albumin: 2.6 g/dL — ABNORMAL LOW (ref 3.5–5.2)
Alkaline Phosphatase: 60 U/L (ref 39–117)
Chloride: 105 mEq/L (ref 96–112)
Potassium: 3.7 mEq/L (ref 3.5–5.1)
Total Bilirubin: 1.4 mg/dL — ABNORMAL HIGH (ref 0.3–1.2)

## 2012-04-05 LAB — CBC
HCT: 21.9 % — ABNORMAL LOW (ref 39.0–52.0)
HCT: 23.8 % — ABNORMAL LOW (ref 39.0–52.0)
HCT: 24.2 % — ABNORMAL LOW (ref 39.0–52.0)
HCT: 24.4 % — ABNORMAL LOW (ref 39.0–52.0)
Hemoglobin: 7.3 g/dL — ABNORMAL LOW (ref 13.0–17.0)
Hemoglobin: 8 g/dL — ABNORMAL LOW (ref 13.0–17.0)
Hemoglobin: 8.2 g/dL — ABNORMAL LOW (ref 13.0–17.0)
MCH: 28.2 pg (ref 26.0–34.0)
MCH: 28.6 pg (ref 26.0–34.0)
MCH: 29 pg (ref 26.0–34.0)
MCH: 29 pg (ref 26.0–34.0)
MCHC: 33.6 g/dL (ref 30.0–36.0)
MCHC: 33.6 g/dL (ref 30.0–36.0)
MCV: 85.2 fL (ref 78.0–100.0)
MCV: 85.9 fL (ref 78.0–100.0)
MCV: 86.2 fL (ref 78.0–100.0)
Platelets: 48 10*3/uL — ABNORMAL LOW (ref 150–400)
RBC: 2.55 MIL/uL — ABNORMAL LOW (ref 4.22–5.81)
RBC: 2.76 MIL/uL — ABNORMAL LOW (ref 4.22–5.81)
RBC: 2.83 MIL/uL — ABNORMAL LOW (ref 4.22–5.81)
RBC: 2.84 MIL/uL — ABNORMAL LOW (ref 4.22–5.81)
RDW: 17.5 % — ABNORMAL HIGH (ref 11.5–15.5)
RDW: 17.5 % — ABNORMAL HIGH (ref 11.5–15.5)
WBC: 3.9 10*3/uL — ABNORMAL LOW (ref 4.0–10.5)
WBC: 7.1 10*3/uL (ref 4.0–10.5)

## 2012-04-05 LAB — GLUCOSE, CAPILLARY
Glucose-Capillary: 147 mg/dL — ABNORMAL HIGH (ref 70–99)
Glucose-Capillary: 139 mg/dL — ABNORMAL HIGH (ref 70–99)
Glucose-Capillary: 207 mg/dL — ABNORMAL HIGH (ref 70–99)
Glucose-Capillary: 168 mg/dL — ABNORMAL HIGH (ref 70–99)

## 2012-04-05 LAB — PREPARE FRESH FROZEN PLASMA

## 2012-04-05 LAB — PREPARE RBC (CROSSMATCH)

## 2012-04-05 MED ORDER — INSULIN ASPART 100 UNIT/ML ~~LOC~~ SOLN
0.0000 [IU] | Freq: Three times a day (TID) | SUBCUTANEOUS | Status: DC
  Administered 2012-04-05: 4 [IU] via SUBCUTANEOUS
  Administered 2012-04-05 – 2012-04-06 (×2): 7 [IU] via SUBCUTANEOUS
  Administered 2012-04-06: 3 [IU] via SUBCUTANEOUS
  Administered 2012-04-06: 7 [IU] via SUBCUTANEOUS
  Administered 2012-04-06: 2 [IU] via SUBCUTANEOUS
  Administered 2012-04-07: 4 [IU] via SUBCUTANEOUS
  Administered 2012-04-07: 3 [IU] via SUBCUTANEOUS
  Administered 2012-04-07: 7 [IU] via SUBCUTANEOUS
  Administered 2012-04-08: 4 [IU] via SUBCUTANEOUS

## 2012-04-05 MED ORDER — INFLUENZA VIRUS VACC SPLIT PF IM SUSP
0.5000 mL | INTRAMUSCULAR | Status: AC
  Administered 2012-04-06: 0.5 mL via INTRAMUSCULAR
  Filled 2012-04-05: qty 0.5

## 2012-04-05 MED ORDER — INSULIN ASPART 100 UNIT/ML ~~LOC~~ SOLN: 0.0000 [IU] | Freq: Every day | SUBCUTANEOUS | Status: DC

## 2012-04-05 MED ORDER — INSULIN GLARGINE 100 UNIT/ML ~~LOC~~ SOLN
15.0000 [IU] | Freq: Every day | SUBCUTANEOUS | Status: DC
Start: 2012-04-05 — End: 2012-04-07
  Administered 2012-04-05 – 2012-04-06 (×2): 15 [IU] via SUBCUTANEOUS

## 2012-04-05 MED ORDER — PNEUMOCOCCAL VAC POLYVALENT 25 MCG/0.5ML IJ INJ
0.5000 mL | INJECTION | INTRAMUSCULAR | Status: AC
  Administered 2012-04-06: 0.5 mL via INTRAMUSCULAR
  Filled 2012-04-05: qty 0.5

## 2012-04-05 NOTE — Progress Notes (Signed)
Pt transferred per MD order to Rm 5508. [name] vital signs stable throughout transportation and was settled comfortably and safely into room.

## 2012-04-05 NOTE — Progress Notes (Signed)
Bilateral:  No evidence of DVT or superficial thrombosis.  There appears to be a Baker's cyst.   

## 2012-04-05 NOTE — Progress Notes (Signed)
Nursing 1015 Lab results available, Anders Simmonds, NP for CCM made aware of results.  Order to transfuse 1 unit PRBC and CBC post transfusion.  PRBCs transfusion started, will continue to assess.

## 2012-04-05 NOTE — Consult Note (Signed)
PULMONARY  / CRITICAL CARE MEDICINE  Name: [name] MRN: [id number] DOB: 07/19/1955    LOS: 2  REFERRING MD :  Toniann Fail ([facility])  CHIEF COMPLAINT:  GI bleed  BRIEF PATIENT DESCRIPTION:  56 yo male with hx ETOH, HTN, DM admitted 11/13 by [facility] with GI bleed.  PCCM consulted for hypotension.   LINES / TUBES: none  CULTURES: none  ANTIBIOTICS: Rocephin 11/13>>>11/14  SIGNIFICANT EVENTS:  11/13 EGD: CLEAN BASED NON-BLEEDING GASTRIC AND DUODENAL ULCERS W/O BLEEDING. INCIDENTAL SMALL ESOPHAGEAL VARICES. NO THERAPY NEEDED. LE [place] 11/14>>>  LEVEL OF CARE:  ICU PRIMARY SERVICE:  [facility] CONSULTANTS:  GI CODE STATUS: full DIET:  NPO DVT Px:  SCD GI Px:  protonix    SUBJECTIVE/OVERNIGHT/INTERVAL HX - still pressor dependent (was started when he got diprivan for intubation for procedure)  VITAL SIGNS: Temp:  [97.7 F (36.5 C)-99.2 F (37.3 C)] 98 F (36.7 C) (11/14 0700) Pulse Rate:  [65-109] 74  (11/14 0800) Resp:  [9-28] 16  (11/14 0800) BP: (90-184)/(32-117) 105/52 mmHg (11/14 0800) SpO2:  [92 %-100 %] 94 % (11/14 0800) FiO2 (%):  [40 %] 40 % (11/13 1000) Weight:  [110.1 kg (242 lb 11.6 oz)] 110.1 kg (242 lb 11.6 oz) (11/14 0000) HEMODYNAMICS:   INTAKE / OUTPUT: Intake/Output      11/13 0701 - 11/14 0700 11/14 0701 - 11/15 0700   P.O. 840 240   I.V. (mL/kg) 4512.3 (41) 138.1 (1.3)   Blood 1187    IV Piggyback 100    Total Intake(mL/kg) 6639.3 (60.3) 378.1 (3.4)   Urine (mL/kg/hr) 3725 (1.4)    Total Output 3725    Net +2914.3 +378.1        Urine Occurrence 2 x     Room air  PHYSICAL EXAMINATION: General:  wdwn male, NAD  Neuro:  Awake, alert, appropriate, MAE HEENT:  Mm dry, no JVD Cardiovascular:  s1s2 rrr Lungs:  resps even non labored on [place], CTA Abdomen:  Soft, hypoactive bs, mildly tender diffusely  Ext: warm and dry, no edema    LABS:  Lab 04/05/12 0455 04/04/12 2350 04/04/12 1620 04/04/12 1105 04/04/12 1000 04/04/12 0011 04/03/12 2149  04/02/12 1325  HGB -- 8.0* 6.8* -- -- -- 6.2* --  WBC -- 7.1 6.9 -- -- -- 5.8 --  PLT -- 77* 64* -- -- -- 79* --  NA 137 -- -- -- -- -- 130* 130*  K 3.7 -- -- -- -- -- 5.2* --  CL 105 -- -- -- -- -- 94* 97  CO2 24 -- -- -- -- -- 21 24  GLUCOSE 157* -- -- -- -- -- 279* 286*  BUN 27* -- -- -- -- -- 43* 11  CREATININE 0.86 -- -- -- -- -- 1.62* 0.75  CALCIUM 7.0* -- -- -- -- -- 7.9* 8.5  MG -- -- -- -- -- -- -- --  PHOS -- -- -- -- -- -- -- --  AST 106* -- -- -- -- -- 34 41*  ALT 51 -- -- -- -- -- 18 21  ALKPHOS 60 -- -- -- -- -- 61 92  BILITOT 1.4* -- -- -- -- -- 1.5* 1.5*  PROT 5.5* -- -- -- -- -- 5.2* 6.8  ALBUMIN 2.6* -- -- -- -- -- 2.5* 3.0*  APTT -- -- -- -- -- -- -- --  INR -- -- -- -- 1.56* 1.82* -- --  LATICACIDVEN -- -- -- -- -- 3.4* -- --  TROPONINI -- -- -- -- -- -- -- --  PROCALCITON -- -- -- -- -- -- -- --  PROBNP -- -- -- -- -- -- -- --  O2SATVEN -- -- -- -- -- -- -- --  PHART -- -- -- 7.309* -- -- -- --  PCO2ART -- -- -- 41.7 -- -- -- --  PO2ART -- -- -- 113.0* -- -- -- --    Lab 04/05/12 0826 04/04/12 2352 04/04/12 1931 04/04/12 1558 04/04/12 1229  GLUCAP 139* 218* 261* 253* 234*    IMAGING: PCXR: Low volume film. No clear infiltrates.  ECG:  DIAGNOSES: Principal Problem:  *Acute GI bleeding Active Problems:  Diabetes mellitus type 2 with complications  HTN (hypertension)  Diabetes mellitus  ARF (acute renal failure)  Acute respiratory failure  Hemorrhagic shock   ASSESSMENT / PLAN:  PULMONARY  ASSESSMENT: No acute issue   PLAN:   Monitor resp status with aggressive volume resuscitation and mult blood products  F/u cxr  CARDIOVASCULAR  ASSESSMENT:  Hypotension/ hypovolemia  Hx HTN  Still on low dose neo (only 35 mcg/min). He does not have  PLAN:  F/u cbc this am Cont IVFs  Hold home anti-HTN Transfuse for hgb < 7 as still on pressors  (STAFF NOTE: NEJM 2013l; GI bleed tx only if < 7gm% unless actively  bleeding)  RENAL  ASSESSMENT:   Acute renal insufficiency - in setting volume depletion +/- mult NSAIDS-->resolved.   PLAN:   Keep even volume status Avoid diuretics for time being.   GASTROINTESTINAL  ASSESSMENT:   Acute UGI bleeding: EGD demonstrated gastric and duodenal ulcers. Not actively bleeding   PLAN:   Advance diet as tol No NSAIDs No caffeine  No ETOH Transition off PPI gtt on  11/15 (Protonix to po BID)   HEMATOLOGIC  ASSESSMENT:   Acute blood loss anemia  Due to UGI bleed. No evidence of blood loss overnight. Hgb had approp bump after x fusion.  Mild coagulopathy: presume due to liver dysfxn. Better after FFP ? H/o RLE and possibly LLE DVT (per pt, never treated) PLAN:  Trend cbc q12, need one now  F/u coags am 11/15 Check LE [place], if positive will need IVC filter as not candidate for anticoagulation  INFECTIOUS  ASSESSMENT:   No evidence acute infection   PLAN:   D/c rocephin    ENDOCRINE  ASSESSMENT:   DM  Glycemic control still needs work PLAN:   Adjust SSI   NEUROLOGIC  ASSESSMENT:   ETOH  Bipolar  No evidence of w/d currently.  PLAN:   CIWA protocol  Watch for withdrawal  No antipsychotics at home  Likely needs psych consult   General statement  Admitted for UGIB. Intubated for airway protection for EGD, extubated w/out difficulty. No current active bleed by EGD on 11/13. No evidence of active bleeding currently . Still on very low dose neo gtt, should come off later today, we only started this so he could be sedated during EGD. Will give another 24 h on PPI gtt, then transition to PO BID PPI. Advance diet today. He describes what sounds like an untreated RLE and possible LLE DVT. We need to determine if this is a active problem. If so he will need IVC as can't be anticoagulated.   BABCOCK,PETE, NP 04/05/2012  9:23 AM    I have personally obtained a history, examined the patient, evaluated laboratory and imaging results,  formulated the assessment and plan and placed orders.    CRITICAL CARE: The patient is critically ill with multiple organ systems  failure and requires high complexity decision making for assessment and support, frequent evaluation and titration of therapies, application of advanced monitoring technologies and extensive interpretation of multiple databases. Critical Care Time devoted to patient care services described in this note is 45 minutes.     Dr. Kalman Shan, M.D., [facility].C.P Pulmonary and Critical Care Medicine Staff Physician [facility] Pulmonary and Critical Care Pager: [phone], If no answer or between  15:00h - 7:00h: call [phone]  04/05/2012 10:07 AM

## 2012-04-06 ENCOUNTER — Inpatient Hospital Stay (HOSPITAL_COMMUNITY): Payer: Medicare PPO

## 2012-04-06 LAB — CBC
HCT: 25 % — ABNORMAL LOW (ref 39.0–52.0)
HCT: 24.7 % — ABNORMAL LOW (ref 39.0–52.0)
Hemoglobin: 8.4 g/dL — ABNORMAL LOW (ref 13.0–17.0)
Hemoglobin: 8.3 g/dL — ABNORMAL LOW (ref 13.0–17.0)
MCH: 29.1 pg (ref 26.0–34.0)
MCHC: 33.6 g/dL (ref 30.0–36.0)
MCV: 86.5 fL (ref 78.0–100.0)
MCV: 87.3 fL (ref 78.0–100.0)
RBC: 2.89 MIL/uL — ABNORMAL LOW (ref 4.22–5.81)

## 2012-04-06 LAB — TYPE AND SCREEN
Unit division: 0
Unit division: 0

## 2012-04-06 LAB — BASIC METABOLIC PANEL
Calcium: 7 mg/dL — ABNORMAL LOW (ref 8.4–10.5)
GFR calc Af Amer: >90 mL/min (ref >90)
GFR calc non Af Amer: >90 mL/min (ref >90)
Glucose, Bld: 170 mg/dL — ABNORMAL HIGH (ref 70–99)
Potassium: 3.2 mEq/L — ABNORMAL LOW (ref 3.5–5.1)
Sodium: 134 mEq/L — ABNORMAL LOW (ref 135–145)

## 2012-04-06 LAB — GLUCOSE, CAPILLARY
Glucose-Capillary: 204 mg/dL — ABNORMAL HIGH (ref 70–99)
Glucose-Capillary: 191 mg/dL — ABNORMAL HIGH (ref 70–99)

## 2012-04-06 MED ORDER — GABAPENTIN 400 MG PO CAPS
400.0000 mg | ORAL_CAPSULE | Freq: Three times a day (TID) | ORAL | Status: DC
  Administered 2012-04-06 – 2012-04-08 (×6): 400 mg via ORAL
  Filled 2012-04-06 (×9): qty 1

## 2012-04-06 MED ORDER — SODIUM CHLORIDE 0.9 % IJ SOLN
10.0000 mL | INTRAMUSCULAR | Status: DC | PRN
  Administered 2012-04-06: 10 mL

## 2012-04-06 MED ORDER — GABAPENTIN 800 MG PO TABS
400.0000 mg | ORAL_TABLET | Freq: Three times a day (TID) | ORAL | Status: DC
Start: 2012-04-06 — End: 2012-04-06
  Filled 2012-04-06 (×2): qty 1

## 2012-04-06 MED ORDER — LISINOPRIL 5 MG PO TABS
5.0000 mg | ORAL_TABLET | Freq: Every day | ORAL | Status: DC
  Administered 2012-04-06 – 2012-04-08 (×3): 5 mg via ORAL
  Filled 2012-04-06 (×3): qty 1

## 2012-04-06 NOTE — Clinical Social Work Note (Signed)
Clinical Social Work Department BRIEF PSYCHOSOCIAL ASSESSMENT 04/06/2012  Patient:  [name], [name]     Account Number:  [id number]     Admit date:  04/03/2012  Clinical Social Worker:  Pearson Forster  Date/Time:  04/05/2012 05:30 PM  Referred by:  Physician  Date Referred:  04/05/2012 Referred for  Other - See comment   Other Referral:   Medication Delivery at home due to inability to drive   Interview type:  Patient Other interview type:   No family currently present at bedside    PSYCHOSOCIAL DATA Living Status:  SIGNIFICANT OTHER Admitted from facility:   Level of care:   Primary support name:  [name]  [phone] Primary support relationship to patient:  PARTNER Degree of support available:   Adequate    CURRENT CONCERNS Current Concerns  Other - See comment   Other Concerns:   Medication Delivery    SOCIAL WORK ASSESSMENT / PLAN Clinical Social Worker met with patient at bedside to offer support and discuss patient needs at discharge.  Patient states that he currently lives with his girlfriend of 20 years who is a CNA and will be able to assist patient intermittently.  Patient daughter lives in the [place] area and may have patient come stay with her temporarily if needed.  Patient plan as of now is to return home with his girlfriend once medically ready for discharge.    Patient also states that he is now unable to drive due to concerns with his vision and is unable to get his medications easily from the pharmacy.  Patient requested resources regarding medication delivery from the pharmacy. Patient is not currently established with a pharmacy and is willing to go through any provider who is able to deliver. CSW contacted [facility] off [address] who states that they will deliver free of charge.  Patient has to call and provide all necessary information to make primary pharmacy in order to be able to receive delivery medications.  CSW to relay to patient at  bedside.    Clinical Social Worker remains available for emotional support as needed.   Assessment/plan status:  Psychosocial Support/Ongoing Assessment of Needs Other assessment/ plan:   Information/referral to community resources:   Clinical Social Worker contacted [facility] on [address] to initiate medication delivery for patient. Patient will be notified that he must contact them directly with his information to make arrangements.  [facility] [address] [phone]    PATIENT'S/FAMILY'S RESPONSE TO PLAN OF CARE: Patient alert and oriented x3 sitting up in bed eating dinner.  Patient is agreeable with plan to go home and make arrangements with medication delivery.  Patient with good support at home to assist intermittently as needed. Patient verbalized his appreciation for CSW support and involvement.    Macario Golds, [place] [phone]

## 2012-04-06 NOTE — Progress Notes (Signed)
Inpatient Diabetes Program Recommendations  AACE/ADA: New Consensus Statement on Inpatient Glycemic Control (2013)  Target Ranges:  Prepandial:   less than 140 mg/dL      Peak postprandial:   less than 180 mg/dL (1-2 hours)      Critically ill patients:  140 - 180 mg/dL  Results for ASHTAN, GIRTMAN (MRN [id number]) as of 04/06/2012 12:47  Ref. Range 04/06/2012 08:03 04/06/2012 12:05  Glucose-Capillary Latest Range: 70-99 mg/dL 956 (H) 213 (H)   Reason for Visit: CBG's continue to be greater than goal.  Note home dose of Lantus was 30 units daily.  Please increasing Lantus to 25 units daily.   Will follow.

## 2012-04-06 NOTE — Progress Notes (Addendum)
Had to page MD on call to confirm if  pt needs a portable chest x-ray for post intubation. RN will call radiology back once I confirm with the MD. MD stated to do pt's x-ray. RN will page radiology.

## 2012-04-06 NOTE — Progress Notes (Signed)
PULMONARY  / CRITICAL CARE MEDICINE  Name: [name] MRN: [id number] DOB: 15-Nov-1955    LOS: 3  REFERRING MD :  Toniann Fail ([facility])  CHIEF COMPLAINT:  GI bleed  BRIEF PATIENT DESCRIPTION:  56 yo male with hx ETOH, HTN, DM admitted 11/13 by [facility] with GI bleed.  PCCM consulted for hypotension.   LINES / TUBES: none  CULTURES: none  ANTIBIOTICS: Rocephin 11/13>>>11/14  SIGNIFICANT EVENTS:  11/13 EGD: CLEAN BASED NON-BLEEDING GASTRIC AND DUODENAL ULCERS W/O BLEEDING. INCIDENTAL SMALL ESOPHAGEAL VARICES. NO THERAPY NEEDED. Lower Extremity U/S 11/14>>>b/l baker's cysts, enlarged inguinal lymph nodes, no DVT  LEVEL OF CARE:  Telemetry PRIMARY SERVICE:  [facility] CONSULTANTS:  GI CODE STATUS: full DIET:  NPO DVT Px:  SCD GI Px:  protonix    SUBJECTIVE/OVERNIGHT/INTERVAL HX C/o leg burning.  Denies chest pain.  Breathing okay.  Tolerating diet.  VITAL SIGNS: Temp:  [97.3 F (36.3 C)-98.8 F (37.1 C)] 98.2 F (36.8 C) (11/15 1307) Pulse Rate:  [73-93] 80  (11/15 1307) Resp:  [14-21] 20  (11/15 1307) BP: (104-150)/(47-89) 150/64 mmHg (11/15 1307) SpO2:  [93 %-97 %] 95 % (11/15 1307) Weight:  [255 lb 8.2 oz (115.9 kg)-256 lb 9.9 oz (116.4 kg)] 256 lb 9.9 oz (116.4 kg) (11/15 0559)  INTAKE / OUTPUT: Intake/Output      11/14 0701 - 11/15 0700 11/15 0701 - 11/16 0700   P.O. 1440 598   I.V. (mL/kg) 2770.2 (23.8)    Blood 351    IV Piggyback     Total Intake(mL/kg) 4561.2 (39.2) 598 (5.1)   Urine (mL/kg/hr) 1320 (0.5) 300 (0.4)   Total Output 1320 300   Net +3241.2 +298         Room air   PHYSICAL EXAMINATION: General:  wdwn male, NAD  Neuro:  Awake, alert, appropriate, MAE HEENT:  Mm dry, no JVD Cardiovascular:  s1s2 rrr Lungs:  resps even non labored on [place], CTA Abdomen:  Soft, non tender Ext: warm and dry, no edema    LABS:  Lab 04/06/12 1231 04/06/12 0500 04/05/12 2244 04/05/12 1500 04/05/12 0832 04/05/12 0455 04/04/12 1105 04/04/12 1000 04/04/12 0011  04/03/12 2149 04/02/12 1325  HGB 8.4* -- 8.2* 8.0* -- -- -- -- -- -- --  WBC 3.7* -- 3.8* 3.9* -- -- -- -- -- -- --  PLT 47* -- 45* 48* -- -- -- -- -- -- --  NA -- 134* -- -- -- 137 -- -- -- 130* --  K -- 3.2* -- -- -- 3.7 -- -- -- -- --  CL -- 104 -- -- -- 105 -- -- -- 94* --  CO2 -- 25 -- -- -- 24 -- -- -- 21 --  GLUCOSE -- 170* -- -- -- 157* -- -- -- 279* --  BUN -- 14 -- -- -- 27* -- -- -- 43* --  CREATININE -- 0.78 -- -- -- 0.86 -- -- -- 1.62* --  CALCIUM -- 7.0* -- -- -- 7.0* -- -- -- 7.9* --  MG -- -- -- -- -- -- -- -- -- -- --  PHOS -- -- -- -- -- -- -- -- -- -- --  AST -- -- -- -- -- 106* -- -- -- 34 41*  ALT -- -- -- -- -- 51 -- -- -- 18 21  ALKPHOS -- -- -- -- -- 60 -- -- -- 61 92  BILITOT -- -- -- -- -- 1.4* -- -- -- 1.5* 1.5*  PROT -- -- -- -- --  5.5* -- -- -- 5.2* 6.8  ALBUMIN -- -- -- -- -- 2.6* -- -- -- 2.5* 3.0*  APTT -- -- -- -- -- -- -- -- -- -- --  INR -- -- -- -- 1.51* -- -- 1.56* 1.82* -- --  LATICACIDVEN -- -- -- -- -- -- -- -- 3.4* -- --  TROPONINI -- -- -- -- -- -- -- -- -- -- --  PROCALCITON -- -- -- -- -- -- -- -- -- -- --  PROBNP -- -- -- -- -- -- -- -- -- -- --  O2SATVEN -- -- -- -- -- -- -- -- -- -- --  PHART -- -- -- -- -- -- 7.309* -- -- -- --  PCO2ART -- -- -- -- -- -- 41.7 -- -- -- --  PO2ART -- -- -- -- -- -- 113.0* -- -- -- --    Lab 04/06/12 1205 04/06/12 0803 04/05/12 2150 04/05/12 1702 04/05/12 1553  GLUCAP 204* 148* 168* 218* 207*    IMAGING: PCXR 11/15>> no acute ASD   DIAGNOSES: Principal Problem:  *Acute GI bleeding Active Problems:  Diabetes mellitus type 2 with complications  HTN (hypertension)  Diabetes mellitus  ARF (acute renal failure)  Acute respiratory failure  Hemorrhagic shock   ASSESSMENT / PLAN:   A: Hypotension/ hypovolemia 2nd to GI bleed. Resolved. P: D/c CVL  A: Hx HTN  P: Resume lisinopril 5 mg daily    A: Acute renal insufficiency - in setting volume depletion +/- mult NSAIDS-->Resolved.     A: Acute UGI bleeding: EGD demonstrated gastric and duodenal ulcers. Not actively bleeding  P:   No NSAIDs No caffeine  No ETOH Transition to BID protonix per GI  A: Acute blood loss anemia  Due to UGI bleed. No evidence of blood loss overnight. Hgb had approp bump after x fusion.  P: F/u CBC Transfuse for Hb < 7 or bleeding  A: Mild coagulopathy: presume due to liver dysfxn. Better after FFP  A: DM  P: SSI  A: Hx of ETOH  P: CIWA protocol  A: Bipolar  P: Monitor mental status  Peripheral neuropathy P: Resume neurontin 400 mg tid PRN percocet   Coralyn Helling, MD [place] [facility] 04/06/2012, 1:22 PM Pager:  [phone] After 3pm call: [phone]

## 2012-04-06 NOTE — Progress Notes (Signed)
Clinical Social Work  CSW spoke with 3100 unit CSW who updated this Clinical research associate on patient's needs. 3100 CSW had researched options for a pharmacy that would deliver medications to patient's house. CSW provided patient with this information and put information on AVS. Patient grateful for consult and reports no further needs at this time. CSW is signing off but available if needed.  [place], [place] [phone]

## 2012-04-06 NOTE — Progress Notes (Signed)
Pt was transferred from 3100 to room 5508. Pt is in no apparent distress and VS are stable. Pt was oriented to room.  RN will continue to monitor pt.

## 2012-04-07 LAB — GLUCOSE, CAPILLARY
Glucose-Capillary: 133 mg/dL — ABNORMAL HIGH (ref 70–99)
Glucose-Capillary: 212 mg/dL — ABNORMAL HIGH (ref 70–99)
Glucose-Capillary: 172 mg/dL — ABNORMAL HIGH (ref 70–99)

## 2012-04-07 LAB — CBC
HCT: 27.1 % — ABNORMAL LOW (ref 39.0–52.0)
Hemoglobin: 8.5 g/dL — ABNORMAL LOW (ref 13.0–17.0)
Platelets: 46 10*3/uL — ABNORMAL LOW (ref 150–400)
RBC: 2.97 MIL/uL — ABNORMAL LOW (ref 4.22–5.81)
RDW: 17.5 % — ABNORMAL HIGH (ref 11.5–15.5)
WBC: 3.9 10*3/uL — ABNORMAL LOW (ref 4.0–10.5)

## 2012-04-07 LAB — BASIC METABOLIC PANEL
BUN: 9 mg/dL (ref 6–23)
Chloride: 104 mEq/L (ref 96–112)
GFR calc Af Amer: >90 mL/min (ref >90)
Potassium: 3.5 mEq/L (ref 3.5–5.1)

## 2012-04-07 MED ORDER — INSULIN GLARGINE 100 UNIT/ML ~~LOC~~ SOLN
20.0000 [IU] | Freq: Every day | SUBCUTANEOUS | Status: DC
  Administered 2012-04-07: 20 [IU] via SUBCUTANEOUS

## 2012-04-07 MED ORDER — PANTOPRAZOLE SODIUM 40 MG PO TBEC
40.0000 mg | DELAYED_RELEASE_TABLET | Freq: Two times a day (BID) | ORAL | Status: DC
  Administered 2012-04-07 – 2012-04-08 (×3): 40 mg via ORAL
  Filled 2012-04-07 (×2): qty 1

## 2012-04-07 MED ORDER — PANTOPRAZOLE SODIUM 40 MG PO TBEC: 40.0000 mg | DELAYED_RELEASE_TABLET | Freq: Two times a day (BID) | ORAL | Status: DC

## 2012-04-07 NOTE — Progress Notes (Signed)
[facility] PROGRESS NOTE  [name] ZOX:[id number] DOB: [birth year] DOA: 04/03/2012 PCP: Sheila Oats, MD  HPI/Subjective: Feels okay, denies any complaints.   Assessment/Plan:  Upper GI bleeding -Secondary to peptic ulcer disease. EGD showed a nonbleeding ulcers. -This as initially thought to be secondary to variceal bleed the patient was sent to the ICU, started on octreotide. -Patient was using Goody powder, he is also alcoholic. -EGD showed grade 1 esophageal varices without evidence of bleeding. -So far no evidence of rebleed, hemoglobin stable at 9.0. Continue Protonix twice a day  Hypertension -This is likely secondary to his significant acute blood loss and anemia. -Patient was on vasopressors till yesterday. Weaned off successfully about noontime on 04/06/2012. -Blood pressure is stable right now, will restart his blood pressure medications when needed.  Acute blood loss anemia -Secondary to upper GI bleed, patient presented with hemoglobin of 6.2. -So far status post transfusion of 8 units of packed RBCs. -Hemoglobin is stable, no melena or hematemesis.  Acute renal failure -Secondary to dehydration and hypovolemia. -Patient presented with a creatinine of 1.62, after hydration patient creatinine is back to normal at 0.8.  Diabetes mellitus type 2 -Uncontrolled DM, I will not check hemoglobin A1c now as patient has massive recent blood transfusion. -Increase his Lantus to 20 units.  Coagulopathy -This is likely secondary to chronic alcohol drinking, also has thrombocytopenia consistent with chronic liver disease.  Code Status: Full Family Communication:  Disposition Plan: Remain inpatient   Consultants:  Y[facility] Flemings of [facility] gastroenterology  Procedures:  EGD done on 04/04/2012 by Dr. Marina Goodell showed small nonbleeding distal varices without stigmata, possible clean-based antral ulcers and small clean-based bulbar ulcers felt to be the cause of the  bleeding. No active bleeding seen.  Antibiotics:  None   Objective: Filed Vitals:   04/07/12 0100 04/07/12 0500 04/07/12 0633 04/07/12 0800  BP: 125/75  147/75 138/75  Pulse: 82  84 76  Temp: 98.3 F (36.8 C)  98.6 F (37 C)   TempSrc: Oral  Oral   Resp: 18  20   Height:      Weight:  116.2 kg (256 lb 2.8 oz) 116.2 kg (256 lb 2.8 oz)   SpO2: 98%  96%     Intake/Output Summary (Last 24 hours) at 04/07/12 1108 Last data filed at 04/07/12 1043  Gross per 24 hour  Intake 2952.5 ml  Output   2350 ml  Net  602.5 ml   Filed Weights   04/06/12 0559 04/07/12 0500 04/07/12 0633  Weight: 116.4 kg (256 lb 9.9 oz) 116.2 kg (256 lb 2.8 oz) 116.2 kg (256 lb 2.8 oz)    Exam:  General: Alert and awake, oriented x3, not in any acute distress. HEENT: anicteric sclera, pupils reactive to light and accommodation, EOMI CVS: S1-S2 clear, no murmur rubs or gallops Chest: clear to auscultation bilaterally, no wheezing, rales or rhonchi Abdomen: soft nontender, nondistended, normal bowel sounds, no organomegaly Extremities: no cyanosis, clubbing or edema noted bilaterally Neuro: Cranial nerves II-XII intact, no focal neurological deficits  Data Reviewed: Basic Metabolic Panel:  Lab 04/07/12 8119 04/06/12 0500 04/05/12 0455 04/03/12 2149 04/02/12 1325  NA 133* 134* 137 130* 130*  K 3.5 3.2* 3.7 5.2* 3.8  CL 104 104 105 94* 97  CO2 25 25 24 21 24   GLUCOSE 165* 170* 157* 279* 286*  BUN 9 14 27* 43* 11  CREATININE 0.70 0.78 0.86 1.62* 0.75  CALCIUM 7.4* 7.0* 7.0* 7.9* 8.5  MG -- -- -- -- --  PHOS -- -- -- -- --   Liver Function Tests:  Lab 04/05/12 0455 04/03/12 2149 04/02/12 1325  AST 106* 34 41*  ALT 51 18 21  ALKPHOS 60 61 92  BILITOT 1.4* 1.5* 1.5*  PROT 5.5* 5.2* 6.8  ALBUMIN 2.6* 2.5* 3.0*    Lab 04/03/12 2149  LIPASE 38  AMYLASE --   No results found for this basename: AMMONIA:5 in the last 168 hours CBC:  Lab 04/07/12 0858 04/06/12 2150 04/06/12 1231 04/05/12  2244 04/05/12 1500 04/03/12 2149 04/02/12 1325  WBC 3.9* 3.5* 3.7* 3.8* 3.9* -- --  NEUTROABS -- -- -- -- -- 3.8 2.1  HGB 9.0* 8.3* 8.4* 8.2* 8.0* -- --  HCT 27.1* 24.7* 25.0* 24.4* 23.8* -- --  MCV 87.7 87.3 86.5 86.2 86.2 -- --  PLT 46* 41* 47* 45* 48* -- --   Cardiac Enzymes: No results found for this basename: CKTOTAL:5,CKMB:5,CKMBINDEX:5,TROPONINI:5 in the last 168 hours BNP (last 3 results) No results found for this basename: PROBNP:3 in the last 8760 hours CBG:  Lab 04/07/12 0746 04/06/12 2129 04/06/12 1658 04/06/12 1205 04/06/12 0803  GLUCAP 133* 191* 207* 204* 148*    Recent Results (from the past 240 hour(s))  MRSA PCR SCREENING     Status: Normal   Collection Time   04/04/12  3:25 AM      Component Value Range Status Comment   MRSA by PCR NEGATIVE  NEGATIVE Final      Studies: Dg Chest Port 1 View  04/06/2012  *RADIOLOGY REPORT*  Clinical Data: Endotracheal removal.  PORTABLE CHEST - 1 VIEW  Comparison: 11/13  Findings: Endotracheal tube has been removed.  Right internal jugular central line remains in place with its tip at the level of the azygos vein.  Heart mediastinal shadows remain normal.  The vascularity is normal.  Lungs are clear.  No collapse or effusions.  IMPRESSION: Endotracheal tube removed.  Central line remains in place.  No active cardiopulmonary disease evident.   Original Report Authenticated By: Paulina Fusi, M.D.     Scheduled Meds:   . folic acid  1 mg Oral Daily  . gabapentin  400 mg Oral TID  . insulin aspart  0-20 Units Subcutaneous TID WC  . insulin aspart  0-5 Units Subcutaneous QHS  . insulin glargine  15 Units Subcutaneous QHS  . lisinopril  5 mg Oral Daily  . [EXPIRED] LORazepam  0-4 mg Oral Q6H   Followed by  . LORazepam  0-4 mg Oral Q12H  . multivitamin with minerals  1 tablet Oral Daily  . sodium chloride  3 mL Intravenous Q12H  . thiamine  100 mg Oral Daily  . [DISCONTINUED] gabapentin  400 mg Oral TID  . [DISCONTINUED]  thiamine  100 mg Intravenous Daily   Continuous Infusions:   . sodium chloride 50 mL/hr at 04/07/12 0600  . pantoprozole (PROTONIX) infusion 8 mg/hr (04/06/12 2158)    Principal Problem:  *Acute GI bleeding Active Problems:  Diabetes mellitus type 2 with complications  HTN (hypertension)  Diabetes mellitus  ARF (acute renal failure)  Acute respiratory failure  Hemorrhagic shock    Time spent: 36 minutes    [facility]  [facility] Pager [phone]. If 8PM-8AM, please contact night-coverage at [email or web address], password [facility] 04/07/2012, 11:08 AM  LOS: 4 days

## 2012-04-08 LAB — BASIC METABOLIC PANEL
GFR calc Af Amer: >90 mL/min (ref >90)
GFR calc non Af Amer: >90 mL/min (ref >90)
Potassium: 3.7 mEq/L (ref 3.5–5.1)
Sodium: 135 mEq/L (ref 135–145)

## 2012-04-08 LAB — GLUCOSE, CAPILLARY
Glucose-Capillary: 117 mg/dL — ABNORMAL HIGH (ref 70–99)
Glucose-Capillary: 175 mg/dL — ABNORMAL HIGH (ref 70–99)

## 2012-04-08 MED ORDER — PANTOPRAZOLE SODIUM 40 MG PO TBEC: 40.0000 mg | DELAYED_RELEASE_TABLET | Freq: Two times a day (BID) | ORAL | Status: DC

## 2012-04-08 MED ORDER — PANTOPRAZOLE SODIUM 40 MG PO TBEC: 40.0000 mg | DELAYED_RELEASE_TABLET | Freq: Every day | ORAL | Status: DC

## 2012-04-08 NOTE — Progress Notes (Signed)
04/08/12 Patient to be discharged today home. IV site removed. Discharge instructions to be reviewed by patient and family.

## 2012-04-08 NOTE — Discharge Summary (Signed)
Physician Discharge Summary  [name]:[id number] DOB: 1955-09-26 DOA: 04/03/2012  PCP: Sheila Oats, MD  Admit date: 04/03/2012 Discharge date: 04/08/2012  Time spent: 40 minutes  Recommendations for Outpatient Follow-up:  1. Followup with primary care physician in one week.  Discharge Diagnoses:  Principal Problem:  *Acute GI bleeding Active Problems:  Diabetes mellitus type 2 with complications  HTN (hypertension)  Diabetes mellitus  ARF (acute renal failure)  Acute respiratory failure  Hemorrhagic shock   Discharge Condition: Stable  Diet recommendation: Regular  Filed Weights   04/07/12 0500 04/07/12 0633 04/08/12 0415  Weight: 116.2 kg (256 lb 2.8 oz) 116.2 kg (256 lb 2.8 oz) 118.2 kg (260 lb 9.3 oz)    History of present illness:  56 year-old male with history of chronic alcoholism, hypertension and diabetes presented the ER because of persistent nausea vomiting and diarrhea. Patient states that a day ago patient had salad after which patient started having multiple episodes of nausea vomiting which were brown coffee ground in color. He also had at least 3-4 episodes of black bowel movement. Patient presented the ER and he initially was hypotensive which improved with 2 L of normal saline. Patient's hemoglobin has decreased to 6 from his usual of 9. Stool for occult blood that by ER physician Dr. Betsy Pries was positive and melanotic. At this time patient has been admitted for acute GI bleed.  Patient states he takes a lot of Goody powder for pain.  ER physician had consulted gastroenterologist on call and at this time patient will be admitted to hospitalist service.   Hospital Course:   1. Upper GI bleed: Patient presented with hematemesis and melena. Because of patient's history of chronic liver disease and alcohol, it was assumed initially this is secondary to esophageal variceal bleeding. Especially after patient had episode of hypotension and he was  transferred to the ICU for further management. Gastroenterology service was consulted, and EGD was done and showed small varices without stigmata of bleeding, there is also antral and bulbar clean-based ulcers. Gastroenterology recommended PPI twice a day, advised patient to avoid NSAIDs and alcohol. Patient did very well, his diet advanced, with no evidence of rebleeding. He is to be discharged home today to follow up with his primary care physician. Followup with gastroenterology as needed.  2. Acute blood loss anemia: Patient had significant blood loss anemia, at the time of presentation to the hospital his hemoglobin was 6.2. He had hemorrhagic shock secondary to his bleeding and he ended up in the ICU with use of pressors. His anemia required transfusion of 6 units of packed RBCs. At the day of discharge his hemoglobin is 8.5, this is been stable for the past 48 hours.  3. Hemorrhagic shock: As mentioned above the patient secondary to his significant bleeding had low blood pressure and he was transferred to the ICU. Pulmonary and critical care medicine was consulted and patient was placed on vasopressors, aggressive resuscitation with IV fluids and packed RBCs was instituted. Patient blood pressure is stable now and even his blood pressure medications were restarted.  4. Acute renal failure: This is secondary to dehydration hypovolemia, patient presented with creatinine of 1.6 to, after aggressive hydration patient creatinine is back to normal. The day of discharge creatinine is 0.8.  5. Diabetes mellitus type 2: Uncontrolled diabetes mellitus, I did not check his hemoglobin A1c because of recent massive blood transfusion which can alter hemoglobin A1c levels. Patient to continue his home medications, followup with his primary care  physician for further adjustment of his diabetic regimen.  6. Alcohol abuse: Patient is a chronic avid drinker, he said he drinks about 6 beers a day for a long time,  patient does have hypoalbuminemia, thrombocytopenia, an elevated INR, and hyperbilirubinemia consistent with chronic liver disease. As mentioned above on the ED showed a small varices which is nonbleeding. Patient to followup with gastroenterology as needed. Counseled about alcohol abuse.   Procedures:  EGD done on 1113 by Dr. Marina Goodell. Showed small nonbleeding distal varices and multiple clean-based antral and bulbar ulcers, lower for rebleeding.  Consultations:  [facility] Flemings of [place] gastroenterology  Discharge Exam: Filed Vitals:   04/07/12 2024 04/07/12 2356 04/08/12 0415 04/08/12 0830  BP: 157/63 115/69 151/72 148/72  Pulse: 85 78 81 81  Temp: 98.2 F (36.8 C) 98.1 F (36.7 C) 99.2 F (37.3 C)   TempSrc: Oral Oral Oral   Resp: 17 17 17    Height:      Weight:   118.2 kg (260 lb 9.3 oz)   SpO2: 96% 98% 97%    General: Alert and awake, oriented x3, not in any acute distress. HEENT: anicteric sclera, pupils reactive to light and accommodation, EOMI CVS: S1-S2 clear, no murmur rubs or gallops Chest: clear to auscultation bilaterally, no wheezing, rales or rhonchi Abdomen: soft nontender, nondistended, normal bowel sounds, no organomegaly Extremities: no cyanosis, clubbing or edema noted bilaterally Neuro: Cranial nerves II-XII intact, no focal neurological deficits  Discharge Instructions  Discharge Orders    Future Orders Please Complete By Expires   Increase activity slowly          Medication List     As of 04/08/2012  1:02 PM    STOP taking these medications         GOODY PM PO      TAKE these medications         albuterol 108 (90 BASE) MCG/ACT inhaler   Commonly known as: PROVENTIL HFA;VENTOLIN HFA   Inhale 2 puffs into the lungs every 6 (six) hours as needed. For shortness of breath      aspirin EC 81 MG tablet   Take 81 mg by mouth daily.      cholecalciferol 1000 UNITS tablet   Commonly known as: VITAMIN D   Take 1,000 Units by mouth daily.       diazepam 5 MG tablet   Commonly known as: VALIUM   Take 1 tablet (5 mg total) by mouth 2 (two) times daily.      furosemide 20 MG tablet   Commonly known as: LASIX   Take 20 mg by mouth daily.      gabapentin 400 MG capsule   Commonly known as: NEURONTIN   Take 400 mg by mouth 3 (three) times daily.      insulin glargine 100 UNIT/ML injection   Commonly known as: LANTUS   Inject 30 Units into the skin at bedtime.      lisinopril 20 MG tablet   Commonly known as: PRINIVIL,ZESTRIL   Take 0.5 tablets (10 mg total) by mouth daily.      multivitamins ther. w/minerals Tabs   Take 1 tablet by mouth daily.      oxyCODONE-acetaminophen 5-325 MG per tablet   Commonly known as: PERCOCET/ROXICET   Take 1 tablet by mouth every 6 (six) hours as needed. For pain      pantoprazole 40 MG tablet   Commonly known as: PROTONIX   Take 1 tablet (40 mg total)  by mouth 2 (two) times daily before a meal.      POTASSIUM PO   Take 1 tablet by mouth daily. Over the counter potassium tablet-pt unsure of strength           Follow-up Information    Call [facility]. (Will deliver medications to your house)    Contact information:   [address] [place], [place] [phone]      Follow up with [facility]. In 1 week.   Contact information:   [address] [place], [place] [address]          The results of significant diagnostics from this hospitalization (including imaging, microbiology, ancillary and laboratory) are listed below for reference.    Significant Diagnostic Studies: Dg Chest Port 1 View  04/06/2012  *RADIOLOGY REPORT*  Clinical Data: Endotracheal removal.  PORTABLE CHEST - 1 VIEW  Comparison: 11/13  Findings: Endotracheal tube has been removed.  Right internal jugular central line remains in place with its tip at the level of the azygos vein.  Heart mediastinal shadows remain normal.  The vascularity is normal.  Lungs are clear.  No collapse or effusions.   IMPRESSION: Endotracheal tube removed.  Central line remains in place.  No active cardiopulmonary disease evident.   Original Report Authenticated By: Paulina Fusi, M.D.    Dg Chest Port 1 View  04/04/2012  *RADIOLOGY REPORT*  Clinical Data: Placement of right IJ central venous line  PORTABLE CHEST - 1 VIEW  Comparison: Portable chest x-ray from earlier today  Findings: The tip of the endotracheal tube is approximately 4.0 cm above the carina.  A right IJ central venous line is now present overlying the region of the upper SVC.  No pneumothorax is seen. The lungs are not quite as well aerated.  Cardiomegaly is stable.  IMPRESSION:  1.  Right IJ central venous line tip overlies the upper SVC.  No pneumothorax. 2.  Endotracheal tube tip 4.0 cm above the carina. 3.  Slightly diminished aeration.   Original Report Authenticated By: Dwyane Dee, M.D.    Dg Chest Port 1 View  04/04/2012  *RADIOLOGY REPORT*  Clinical Data: Syncope  PORTABLE CHEST - 1 VIEW  Comparison: 02/09/2012  Findings: Lungs are clear. No pleural effusion or pneumothorax.  Cardiomediastinal silhouette is within normal limits.  IMPRESSION: No evidence of acute cardiopulmonary disease.   Original Report Authenticated By: Charline Bills, M.D.     Microbiology: Recent Results (from the past 240 hour(s))  MRSA PCR SCREENING     Status: Normal   Collection Time   04/04/12  3:25 AM      Component Value Range Status Comment   MRSA by PCR NEGATIVE  NEGATIVE Final      Labs: Basic Metabolic Panel:  Lab 04/08/12 9147 04/07/12 0430 04/06/12 0500 04/05/12 0455 04/03/12 2149  NA 135 133* 134* 137 130*  K 3.7 3.5 3.2* 3.7 5.2*  CL 104 104 104 105 94*  CO2 26 25 25 24 21   GLUCOSE 144* 165* 170* 157* 279*  BUN 8 9 14  27* 43*  CREATININE 0.71 0.70 0.78 0.86 1.62*  CALCIUM 7.8* 7.4* 7.0* 7.0* 7.9*  MG -- -- -- -- --  PHOS -- -- -- -- --   Liver Function Tests:  Lab 04/05/12 0455 04/03/12 2149 04/02/12 1325  AST 106* 34 41*  ALT 51  18 21  ALKPHOS 60 61 92  BILITOT 1.4* 1.5* 1.5*  PROT 5.5* 5.2* 6.8  ALBUMIN 2.6* 2.5*  3.0*    Lab 04/03/12 2149  LIPASE 38  AMYLASE --   No results found for this basename: AMMONIA:5 in the last 168 hours CBC:  Lab 04/07/12 2105 04/07/12 0858 04/06/12 2150 04/06/12 1231 04/05/12 2244 04/03/12 2149 04/02/12 1325  WBC 3.6* 3.9* 3.5* 3.7* 3.8* -- --  NEUTROABS -- -- -- -- -- 3.8 2.1  HGB 8.5* 9.0* 8.3* 8.4* 8.2* -- --  HCT 25.8* 27.1* 24.7* 25.0* 24.4* -- --  MCV 86.9 87.7 87.3 86.5 86.2 -- --  PLT 48* 46* 41* 47* 45* -- --   Cardiac Enzymes: No results found for this basename: CKTOTAL:5,CKMB:5,CKMBINDEX:5,TROPONINI:5 in the last 168 hours BNP: BNP (last 3 results) No results found for this basename: PROBNP:3 in the last 8760 hours CBG:  Lab 04/08/12 1146 04/08/12 0748 04/07/12 2056 04/07/12 1633 04/07/12 1207  GLUCAP 175* 117* 213* 172* 212*       Signed:  Anthone Prieur A  [facility] 04/08/2012, 1:02 PM

## 2012-04-16 ENCOUNTER — Ambulatory Visit (INDEPENDENT_AMBULATORY_CARE_PROVIDER_SITE_OTHER): Payer: Medicare PPO | Admitting: Family Medicine

## 2012-04-16 VITALS — BP 152/70 | HR 87 | Temp 97.6°F | Resp 18 | Ht 69.5 in | Wt 230.0 lb

## 2012-04-16 DIAGNOSIS — G8929 Other chronic pain: Secondary | ICD-10-CM

## 2012-04-16 DIAGNOSIS — R5381 Other malaise: Secondary | ICD-10-CM

## 2012-04-16 DIAGNOSIS — Z8719 Personal history of other diseases of the digestive system: Secondary | ICD-10-CM

## 2012-04-16 DIAGNOSIS — R5383 Other fatigue: Secondary | ICD-10-CM

## 2012-04-16 LAB — POCT CBC
Granulocyte percent: 58.3 %G (ref 37–80)
HCT, POC: 33.4 % — AB (ref 43.5–53.7)
MCV: 88.5 fL (ref 80–97)
RBC: 3.77 M/uL — AB (ref 4.69–6.13)

## 2012-04-16 MED ORDER — DIAZEPAM 5 MG PO TABS: 5.0000 mg | ORAL_TABLET | Freq: Two times a day (BID) | ORAL | Status: DC

## 2012-04-16 MED ORDER — FUROSEMIDE 20 MG PO TABS: 20.0000 mg | ORAL_TABLET | Freq: Every day | ORAL | Status: DC

## 2012-04-16 MED ORDER — LISINOPRIL 20 MG PO TABS: 10.0000 mg | ORAL_TABLET | Freq: Every day | ORAL | Status: DC

## 2012-04-16 MED ORDER — INSULIN GLARGINE 100 UNIT/ML ~~LOC~~ SOLN: 30.0000 [IU] | Freq: Every day | SUBCUTANEOUS | Status: DC

## 2012-04-16 MED ORDER — OXYCODONE-ACETAMINOPHEN 5-325 MG PO TABS: 1.0000 | ORAL_TABLET | Freq: Four times a day (QID) | ORAL | Status: DC | PRN

## 2012-04-16 NOTE — Patient Instructions (Addendum)
I have sent your medicines in.  It was good to meet you today.  I'm glad you're doing much better.

## 2012-04-17 LAB — COMPREHENSIVE METABOLIC PANEL
AST: 30 U/L (ref 0–37)
Albumin: 3.5 g/dL (ref 3.5–5.2)
BUN: 7 mg/dL (ref 6–23)
Calcium: 8.5 mg/dL (ref 8.4–10.5)
Chloride: 104 mEq/L (ref 96–112)
Glucose, Bld: 289 mg/dL — ABNORMAL HIGH (ref 70–99)
Potassium: 3.9 mEq/L (ref 3.5–5.3)

## 2012-04-17 NOTE — Progress Notes (Signed)
Patient ID: [name], male   DOB: [birth year], 56 y.o.   MRN: [id number] [name] is a 56 y.o. male who presents to Urgent Care today for hospital follow-up:  1.  Hospitalization for acute GI bleed:  56yo M with past history of chronic alcoholism with recent hospitalization for acute upper GI bleed.  Patient treated in ICU and s/p 5 units PRBCs.  Upper endoscopy performed with multiple ulcer with clean bases found.  Started on high dose PPI and patient recovered well.  Since being discharged from hospital, he has abstained from all EtOH usage.  He has also abstained from any NSAID use.  Complains of some persistent mild fatigue as well as muscle aches, which are chronic for him.  He was previously disabled and is on chronic Percocet provided by his PCP, who is out of the [place] currently until some time in December.  He has had no further episodes of bleeding or coffee ground emesis.  Denies melena, hematochezia, chest pain, palpitations, nausea, vomiting.  States that being in the hospital was a "life-changing event" for him. Has forsworn all alcohol.  Is still taking Protonix on BID basis.    PMH reviewed.  ROS as above otherwise neg.  No chest pain, palpitations, SOB, Fever, Chills, Abd pain, N/V/D.  Medications reviewed. Current Outpatient Prescriptions  Medication Sig Dispense Refill  . amitriptyline (ELAVIL) 75 MG tablet Take by mouth at bedtime.      Marland Kitchen aspirin EC 81 MG tablet Take 81 mg by mouth daily.        . cholecalciferol (VITAMIN D) 1000 UNITS tablet Take 1,000 Units by mouth daily.      . diazepam (VALIUM) 5 MG tablet Take 1 tablet (5 mg total) by mouth 2 (two) times daily.  10 tablet  0  . furosemide (LASIX) 20 MG tablet Take 1 tablet (20 mg total) by mouth daily.  30 tablet  1  . gabapentin (NEURONTIN) 400 MG capsule Take 400 mg by mouth 3 (three) times daily.      . insulin glargine (LANTUS) 100 UNIT/ML injection Inject 30 Units into the skin at bedtime.  10 mL  1  .  lisinopril (PRINIVIL,ZESTRIL) 20 MG tablet Take 0.5 tablets (10 mg total) by mouth daily.  20 tablet  1  . Multiple Vitamins-Minerals (MULTIVITAMINS THER. W/MINERALS) TABS Take 1 tablet by mouth daily.  30 each  0  . oxyCODONE-acetaminophen (PERCOCET/ROXICET) 5-325 MG per tablet Take 1 tablet by mouth every 6 (six) hours as needed. For pain  30 tablet  0  . pantoprazole (PROTONIX) 40 MG tablet Take 1 tablet (40 mg total) by mouth 2 (two) times daily before a meal.  60 tablet  0  . POTASSIUM PO Take 1 tablet by mouth daily. Over the counter potassium tablet-pt unsure of strength      . albuterol (PROVENTIL HFA;VENTOLIN HFA) 108 (90 BASE) MCG/ACT inhaler Inhale 2 puffs into the lungs every 6 (six) hours as needed. For shortness of breath        Exam:  BP 152/70  Pulse 87  Temp 97.6 F (36.4 C) (Oral)  Resp 18  Ht 5' 9.5" (1.765 m)  Wt 230 lb (104.327 kg)  BMI 33.48 kg/m2  SpO2 98% Gen: Well NAD HEENT: EOMI,  MMM.  No oral or conjunctival pallor noted.   Neck:  No JVD noted Lungs: CTABL Nl WOB Heart: RRR no MRG Abd: NABS, NT, ND Exts: Non edematous BL  LE, warm and  well perfused.   Results for orders placed in visit on 04/16/12  POCT CBC      Component Value Range   WBC 3.7 (*) 4.6 - 10.2 K/uL   Lymph, poc 1.1  0.6 - 3.4   POC LYMPH PERCENT 30.9  10 - 50 %L   MID (cbc) 0.4  0 - 0.9   POC MID % 10.8  0 - 12 %M   POC Granulocyte 2.2  2 - 6.9   Granulocyte percent 58.3  37 - 80 %G   RBC 3.77 (*) 4.69 - 6.13 M/uL   Hemoglobin 10.2 (*) 14.1 - 18.1 g/dL   HCT, POC 96.0 (*) 45.4 - 53.7 %   MCV 88.5  80 - 97 fL   MCH, POC 27.1  27 - 31.2 pg   MCHC 30.5 (*) 31.8 - 35.4 g/dL   RDW, POC 09.8     Platelet Count, POC 119 (*) 142 - 424 K/uL   MPV 9.6  0 - 99.8 fL    Assessment and Plan:  1.  Follow-up for GI bleed: - Symptomatically much improved. - Hgb only 10.2 today and slightly fatigued.   - Recommended he start OTC iron two times a day and have Hgb rechecked at next PCP appt  to ensure he doesn't have slow bleed/blood loss.     - FU in 1 week with PCP (states this appt is already scheduled)  2.  Chronic pain: - Provided patient refill of his Oxycodone.    3.  Anxiety:  Refilled patient's Diazepam, enough for him to get to his next appt with PCP.  4.  HTN:  - elevated today.  Refilled Lisinopril.  FU with PCP at next visit.

## 2012-04-18 NOTE — Progress Notes (Signed)
I have read, reviewed, and agree with plan of care by Dr. Walden  

## 2012-04-27 ENCOUNTER — Encounter (HOSPITAL_COMMUNITY): Payer: Self-pay | Admitting: *Deleted

## 2012-04-27 ENCOUNTER — Observation Stay (HOSPITAL_COMMUNITY)
Admission: EM | Admit: 2012-04-27 | Discharge: 2012-04-29 | Disposition: A | Discharge status: Home / Self Care | Payer: Medicare PPO | Attending: Internal Medicine | Admitting: Internal Medicine

## 2012-04-27 ENCOUNTER — Emergency Department (HOSPITAL_COMMUNITY): Payer: Medicare PPO

## 2012-04-27 DIAGNOSIS — Z87891 Personal history of nicotine dependence: Secondary | ICD-10-CM | POA: Insufficient documentation

## 2012-04-27 DIAGNOSIS — K259 Gastric ulcer, unspecified as acute or chronic, without hemorrhage or perforation: Secondary | ICD-10-CM | POA: Diagnosis present

## 2012-04-27 DIAGNOSIS — D696 Thrombocytopenia, unspecified: Secondary | ICD-10-CM | POA: Diagnosis present

## 2012-04-27 DIAGNOSIS — Z9181 History of falling: Secondary | ICD-10-CM | POA: Insufficient documentation

## 2012-04-27 DIAGNOSIS — Z79899 Other long term (current) drug therapy: Secondary | ICD-10-CM | POA: Insufficient documentation

## 2012-04-27 DIAGNOSIS — E1165 Type 2 diabetes mellitus with hyperglycemia: Secondary | ICD-10-CM | POA: Insufficient documentation

## 2012-04-27 DIAGNOSIS — F329 Major depressive disorder, single episode, unspecified: Secondary | ICD-10-CM

## 2012-04-27 DIAGNOSIS — F319 Bipolar disorder, unspecified: Secondary | ICD-10-CM | POA: Insufficient documentation

## 2012-04-27 DIAGNOSIS — G589 Mononeuropathy, unspecified: Secondary | ICD-10-CM | POA: Insufficient documentation

## 2012-04-27 DIAGNOSIS — F1010 Alcohol abuse, uncomplicated: Secondary | ICD-10-CM | POA: Insufficient documentation

## 2012-04-27 DIAGNOSIS — E119 Type 2 diabetes mellitus without complications: Secondary | ICD-10-CM | POA: Diagnosis present

## 2012-04-27 DIAGNOSIS — R5381 Other malaise: Secondary | ICD-10-CM

## 2012-04-27 DIAGNOSIS — K7290 Hepatic failure, unspecified without coma: Secondary | ICD-10-CM

## 2012-04-27 DIAGNOSIS — Z8673 Personal history of transient ischemic attack (TIA), and cerebral infarction without residual deficits: Secondary | ICD-10-CM | POA: Insufficient documentation

## 2012-04-27 DIAGNOSIS — R531 Weakness: Secondary | ICD-10-CM | POA: Diagnosis present

## 2012-04-27 DIAGNOSIS — IMO0002 Reserved for concepts with insufficient information to code with codable children: Secondary | ICD-10-CM | POA: Insufficient documentation

## 2012-04-27 DIAGNOSIS — I10 Essential (primary) hypertension: Secondary | ICD-10-CM | POA: Diagnosis present

## 2012-04-27 DIAGNOSIS — Z794 Long term (current) use of insulin: Secondary | ICD-10-CM | POA: Insufficient documentation

## 2012-04-27 DIAGNOSIS — Z7982 Long term (current) use of aspirin: Secondary | ICD-10-CM | POA: Insufficient documentation

## 2012-04-27 DIAGNOSIS — G9340 Encephalopathy, unspecified: Secondary | ICD-10-CM | POA: Insufficient documentation

## 2012-04-27 DIAGNOSIS — F32A Depression, unspecified: Secondary | ICD-10-CM | POA: Diagnosis present

## 2012-04-27 DIAGNOSIS — E118 Type 2 diabetes mellitus with unspecified complications: Secondary | ICD-10-CM

## 2012-04-27 DIAGNOSIS — R4182 Altered mental status, unspecified: Principal | ICD-10-CM

## 2012-04-27 DIAGNOSIS — M129 Arthropathy, unspecified: Secondary | ICD-10-CM | POA: Insufficient documentation

## 2012-04-27 LAB — COMPREHENSIVE METABOLIC PANEL
ALT: 26 U/L (ref 0–53)
AST: 39 U/L — ABNORMAL HIGH (ref 0–37)
Albumin: 3.2 g/dL — ABNORMAL LOW (ref 3.5–5.2)
Alkaline Phosphatase: 75 U/L (ref 39–117)
BUN: 10 mg/dL (ref 6–23)
CO2: 25 mEq/L (ref 19–32)
Calcium: 9.3 mg/dL (ref 8.4–10.5)
Chloride: 100 mEq/L (ref 96–112)
Creatinine, Ser: 0.69 mg/dL (ref 0.50–1.35)
GFR calc Af Amer: >90 mL/min (ref >90)
GFR calc non Af Amer: >90 mL/min (ref >90)
Glucose, Bld: 309 mg/dL — ABNORMAL HIGH (ref 70–99)
Potassium: 3.5 mEq/L (ref 3.5–5.1)
Sodium: 134 mEq/L — ABNORMAL LOW (ref 135–145)
Total Bilirubin: 1.1 mg/dL (ref 0.3–1.2)
Total Protein: 7.3 g/dL (ref 6.0–8.3)

## 2012-04-27 LAB — POCT I-STAT, CHEM 8
Calcium, Ion: 1.16 mmol/L (ref 1.12–1.23)
Creatinine, Ser: 0.7 mg/dL (ref 0.50–1.35)
Glucose, Bld: 319 mg/dL — ABNORMAL HIGH (ref 70–99)
Hemoglobin: 10.2 g/dL — ABNORMAL LOW (ref 13.0–17.0)
Sodium: 137 mEq/L (ref 135–145)
TCO2: 23 mmol/L (ref 0–100)

## 2012-04-27 LAB — CBC WITH DIFFERENTIAL/PLATELET
Basophils Absolute: 0 10*3/uL (ref 0.0–0.1)
Basophils Relative: 1 % (ref 0–1)
Eosinophils Absolute: 0.2 10*3/uL (ref 0.0–0.7)
Eosinophils Relative: 8 % — ABNORMAL HIGH (ref 0–5)
HCT: 29.2 % — ABNORMAL LOW (ref 39.0–52.0)
Hemoglobin: 9.6 g/dL — ABNORMAL LOW (ref 13.0–17.0)
Lymphocytes Relative: 31 % (ref 12–46)
Lymphs Abs: 0.8 10*3/uL (ref 0.7–4.0)
MCH: 27.7 pg (ref 26.0–34.0)
MCHC: 32.9 g/dL (ref 30.0–36.0)
MCV: 84.1 fL (ref 78.0–100.0)
Monocytes Absolute: 0.4 10*3/uL (ref 0.1–1.0)
Monocytes Relative: 14 % — ABNORMAL HIGH (ref 3–12)
Neutro Abs: 1.1 10*3/uL — ABNORMAL LOW (ref 1.7–7.7)
Neutrophils Relative %: 46 % (ref 43–77)
Platelets: 58 10*3/uL — ABNORMAL LOW (ref 150–400)
RBC: 3.47 MIL/uL — ABNORMAL LOW (ref 4.22–5.81)
RDW: 17.7 % — ABNORMAL HIGH (ref 11.5–15.5)
WBC: 2.5 10*3/uL — ABNORMAL LOW (ref 4.0–10.5)

## 2012-04-27 LAB — URINALYSIS, ROUTINE W REFLEX MICROSCOPIC
Bilirubin Urine: NEGATIVE
Glucose, UA: >1000 mg/dL — AB
Hgb urine dipstick: NEGATIVE
Ketones, ur: 15 mg/dL — AB
Leukocytes, UA: NEGATIVE
Nitrite: NEGATIVE
Protein, ur: NEGATIVE mg/dL
Specific Gravity, Urine: 1.04 — ABNORMAL HIGH (ref 1.005–1.030)
Urobilinogen, UA: 1 mg/dL (ref 0.0–1.0)
pH: 6 (ref 5.0–8.0)

## 2012-04-27 LAB — PROTIME-INR
INR: 1.21 (ref 0.00–1.49)
Prothrombin Time: 15.1 seconds (ref 11.6–15.2)

## 2012-04-27 LAB — MAGNESIUM: Magnesium: 1.7 mg/dL (ref 1.5–2.5)

## 2012-04-27 LAB — CBC
HCT: 29.2 % — ABNORMAL LOW (ref 39.0–52.0)
Hemoglobin: 9.9 g/dL — ABNORMAL LOW (ref 13.0–17.0)
MCH: 28.5 pg (ref 26.0–34.0)
MCHC: 33.9 g/dL (ref 30.0–36.0)
MCV: 84.1 fL (ref 78.0–100.0)
Platelets: 62 10*3/uL — ABNORMAL LOW (ref 150–400)
RBC: 3.47 MIL/uL — ABNORMAL LOW (ref 4.22–5.81)
RDW: 17.6 % — ABNORMAL HIGH (ref 11.5–15.5)
WBC: 2.6 10*3/uL — ABNORMAL LOW (ref 4.0–10.5)

## 2012-04-27 LAB — URINE MICROSCOPIC-ADD ON

## 2012-04-27 LAB — GLUCOSE, CAPILLARY: Glucose-Capillary: 331 mg/dL — ABNORMAL HIGH (ref 70–99)

## 2012-04-27 LAB — RAPID URINE DRUG SCREEN, HOSP PERFORMED
Amphetamines: NOT DETECTED
Barbiturates: NOT DETECTED
Benzodiazepines: POSITIVE — AB
Cocaine: NOT DETECTED
Opiates: POSITIVE — AB
Tetrahydrocannabinol: NOT DETECTED

## 2012-04-27 LAB — AMMONIA: Ammonia: 86 umol/L — ABNORMAL HIGH (ref 11–60)

## 2012-04-27 LAB — ETHANOL: Alcohol, Ethyl (B): <11 mg/dL (ref 0–11)

## 2012-04-27 LAB — PHOSPHORUS: Phosphorus: 3.3 mg/dL (ref 2.3–4.6)

## 2012-04-27 MED ORDER — ACETAMINOPHEN 325 MG PO TABS: 650.0000 mg | ORAL_TABLET | Freq: Four times a day (QID) | ORAL | Status: DC | PRN

## 2012-04-27 MED ORDER — VITAMIN B-1 100 MG PO TABS
100.0000 mg | ORAL_TABLET | Freq: Every day | ORAL | Status: DC
  Administered 2012-04-28 – 2012-04-29 (×2): 100 mg via ORAL
  Filled 2012-04-27 (×2): qty 1

## 2012-04-27 MED ORDER — LACTULOSE 10 GM/15ML PO SOLN
20.0000 g | Freq: Once | ORAL | Status: DC
  Filled 2012-04-27: qty 30

## 2012-04-27 MED ORDER — FOLIC ACID 1 MG PO TABS
1.0000 mg | ORAL_TABLET | Freq: Every day | ORAL | Status: DC
  Administered 2012-04-28 – 2012-04-29 (×2): 1 mg via ORAL
  Filled 2012-04-27 (×2): qty 1

## 2012-04-27 MED ORDER — GABAPENTIN 300 MG PO CAPS
300.0000 mg | ORAL_CAPSULE | Freq: Three times a day (TID) | ORAL | Status: DC
  Administered 2012-04-27 – 2012-04-29 (×5): 300 mg via ORAL
  Filled 2012-04-27 (×7): qty 1

## 2012-04-27 MED ORDER — ACETAMINOPHEN 650 MG RE SUPP: 650.0000 mg | Freq: Four times a day (QID) | RECTAL | Status: DC | PRN

## 2012-04-27 MED ORDER — PANTOPRAZOLE SODIUM 40 MG PO TBEC
40.0000 mg | DELAYED_RELEASE_TABLET | Freq: Two times a day (BID) | ORAL | Status: DC
  Administered 2012-04-28 – 2012-04-29 (×3): 40 mg via ORAL
  Filled 2012-04-27: qty 3
  Filled 2012-04-27 (×2): qty 1

## 2012-04-27 MED ORDER — SODIUM CHLORIDE 0.9 % IV SOLN
INTRAVENOUS | Status: AC
  Administered 2012-04-27: 22:00:00 via INTRAVENOUS

## 2012-04-27 MED ORDER — INSULIN GLARGINE 100 UNIT/ML ~~LOC~~ SOLN
20.0000 [IU] | Freq: Every day | SUBCUTANEOUS | Status: DC
  Administered 2012-04-27: 20 [IU] via SUBCUTANEOUS

## 2012-04-27 MED ORDER — ALBUTEROL SULFATE HFA 108 (90 BASE) MCG/ACT IN AERS
2.0000 | INHALATION_SPRAY | Freq: Four times a day (QID) | RESPIRATORY_TRACT | Status: DC | PRN
  Filled 2012-04-27: qty 6.7

## 2012-04-27 MED ORDER — AMITRIPTYLINE HCL 75 MG PO TABS
75.0000 mg | ORAL_TABLET | Freq: Every day | ORAL | Status: DC
  Administered 2012-04-27 – 2012-04-28 (×2): 75 mg via ORAL
  Filled 2012-04-27 (×3): qty 1

## 2012-04-27 MED ORDER — ADULT MULTIVITAMIN W/MINERALS CH
1.0000 | ORAL_TABLET | Freq: Every day | ORAL | Status: DC
  Administered 2012-04-27 – 2012-04-29 (×3): 1 via ORAL
  Filled 2012-04-27 (×3): qty 1

## 2012-04-27 MED ORDER — LISINOPRIL 10 MG PO TABS
10.0000 mg | ORAL_TABLET | Freq: Every day | ORAL | Status: DC
  Administered 2012-04-27 – 2012-04-29 (×3): 10 mg via ORAL
  Filled 2012-04-27 (×3): qty 1

## 2012-04-27 MED ORDER — ONDANSETRON HCL 4 MG PO TABS: 4.0000 mg | ORAL_TABLET | Freq: Four times a day (QID) | ORAL | Status: DC | PRN

## 2012-04-27 MED ORDER — INSULIN ASPART 100 UNIT/ML ~~LOC~~ SOLN
0.0000 [IU] | Freq: Three times a day (TID) | SUBCUTANEOUS | Status: DC
  Administered 2012-04-28: 11 [IU] via SUBCUTANEOUS
  Administered 2012-04-28: 5 [IU] via SUBCUTANEOUS
  Administered 2012-04-28: 2 [IU] via SUBCUTANEOUS
  Administered 2012-04-29: 3 [IU] via SUBCUTANEOUS
  Administered 2012-04-29: 5 [IU] via SUBCUTANEOUS

## 2012-04-27 MED ORDER — ONDANSETRON HCL 4 MG/2ML IJ SOLN: 4.0000 mg | Freq: Four times a day (QID) | INTRAMUSCULAR | Status: DC | PRN

## 2012-04-27 MED ORDER — RIFAXIMIN 550 MG PO TABS
550.0000 mg | ORAL_TABLET | Freq: Two times a day (BID) | ORAL | Status: DC
  Administered 2012-04-27 – 2012-04-29 (×4): 550 mg via ORAL
  Filled 2012-04-27 (×5): qty 1

## 2012-04-27 NOTE — ED Provider Notes (Signed)
History    56 year old male with confusion. Most of the history is from patient's girlfriend and review of records. Patient with recent hospitalization approximately 3 weeks ago with a GI bleed. Girlfriend states that since then he has been increasingly more and more confused. During Thanksgiving he kept falling into his plate of food. He has been walking around as if in a daze. She has witnessed him stumble and fall more than once. No medication changes since discharge aside from PPI. Has been incontinent. No other urinary complaints.  Pt denies pain anywhere. No fever. No n/v. No BRBPR or melena. Hx of ETOH abuse, but denies drinking recently.  CSN: [id number]  Arrival date & time 04/27/12  1308   First MD Initiated Contact with Patient 04/27/12 1459      Chief Complaint  Patient presents with  . Fatigue  . Altered Mental Status    (Consider location/radiation/quality/duration/timing/severity/associated sxs/prior treatment) HPI  Past Medical History  Diagnosis Date  . Neuropathy   . Diabetes mellitus   . Bipolar affect, depressed   . Hypertension   . Stroke     Mini stroke about 71yrs ago  . Arthritis     Past Surgical History  Procedure Date  . Fracture surgery     Leg and arm 19yrs ago  . Esophagogastroduodenoscopy 04/04/2012    Procedure: ESOPHAGOGASTRODUODENOSCOPY (EGD);  Surgeon: Hilarie Fredrickson, MD;  Location: [facility] ENDOSCOPY;  Service: Endoscopy;  Laterality: N/A;    Family History  Problem Relation Age of Onset  . Hypotension Mother     History  Substance Use Topics  . Smoking status: Former Smoker -- 0.5 packs/day for 30 years    Types: Cigarettes  . Smokeless tobacco: Never Used     Comment: quit   . Alcohol Use: No     Comment: heavy (approx 12/day)............. quit  1 week ago      Review of Systems   Review of symptoms negative unless otherwise noted in HPI.   Allergies  Review of patient's allergies indicates no known allergies.  Home  Medications   Current Outpatient Rx  Name  Route  Sig  Dispense  Refill  . ALBUTEROL SULFATE HFA 108 (90 BASE) MCG/ACT IN AERS   Inhalation   Inhale 2 puffs into the lungs every 6 (six) hours as needed. For shortness of breath         . AMITRIPTYLINE HCL 75 MG PO TABS   Oral   Take by mouth at bedtime.         . ASPIRIN EC 81 MG PO TBEC   Oral   Take 81 mg by mouth daily.           Marland Kitchen VITAMIN D 1000 UNITS PO TABS   Oral   Take 1,000 Units by mouth daily.         Marland Kitchen DIAZEPAM 5 MG PO TABS   Oral   Take 1 tablet (5 mg total) by mouth 2 (two) times daily.   10 tablet   0   . FUROSEMIDE 20 MG PO TABS   Oral   Take 1 tablet (20 mg total) by mouth daily.   30 tablet   1   . GABAPENTIN 400 MG PO CAPS   Oral   Take 400 mg by mouth 3 (three) times daily.         . INSULIN GLARGINE 100 UNIT/ML Bradley SOLN   Subcutaneous   Inject 30 Units into the skin at bedtime.  10 mL   1   . LISINOPRIL 20 MG PO TABS   Oral   Take 0.5 tablets (10 mg total) by mouth daily.   20 tablet   1   . THERA M PLUS PO TABS   Oral   Take 1 tablet by mouth daily.   30 each   0   . OXYCODONE-ACETAMINOPHEN 5-325 MG PO TABS   Oral   Take 1 tablet by mouth every 6 (six) hours as needed. For pain   30 tablet   0   . PANTOPRAZOLE SODIUM 40 MG PO TBEC   Oral   Take 1 tablet (40 mg total) by mouth 2 (two) times daily before a meal.   60 tablet   0   . POTASSIUM PO   Oral   Take 1 tablet by mouth daily. Over the counter potassium tablet-pt unsure of strength           BP 135/57  Pulse 94  Temp 98.2 F (36.8 C) (Oral)  Resp 18  SpO2 98%  Physical Exam  Nursing note and vitals reviewed. Constitutional: He appears well-developed and well-nourished. No distress.       Laying in bed. NAD.   HENT:  Head: Normocephalic and atraumatic.  Eyes: Conjunctivae normal are normal. Right eye exhibits no discharge. Left eye exhibits no discharge.  Neck: Neck supple.  Cardiovascular:  Normal rate and regular rhythm.  Exam reveals no gallop and no friction rub.   Murmur heard.      Systolic murmur  Pulmonary/Chest: Effort normal and breath sounds normal. No respiratory distress.  Abdominal: Soft. He exhibits no distension. There is no tenderness.  Musculoskeletal: He exhibits edema. He exhibits no tenderness.       Pitting b/l LE edema  Neurological: He is alert.       Disorienented to place and time. GCS 14. E4, M6, V4. Strength 5/5 b/l u/l ext. CN2-12 intact. Asterixis.   Skin: Skin is warm and dry. No rash noted.  Psychiatric:       Appears confused. Picking at lines and gown.    ED Course  Procedures (including critical care time)  Labs Reviewed  CBC - Abnormal; Notable for the following:    WBC 2.6 (*)     RBC 3.47 (*)     Hemoglobin 9.9 (*)     HCT 29.2 (*)     RDW 17.6 (*)     Platelets 62 (*)  PLATELET COUNT CONFIRMED BY SMEAR   All other components within normal limits  GLUCOSE, CAPILLARY - Abnormal; Notable for the following:    Glucose-Capillary 331 (*)     All other components within normal limits  POCT I-STAT, CHEM 8 - Abnormal; Notable for the following:    Glucose, Bld 319 (*)     Hemoglobin 10.2 (*)     HCT 30.0 (*)     All other components within normal limits  AMMONIA - Abnormal; Notable for the following:    Ammonia 86 (*)     All other components within normal limits  CBC WITH DIFFERENTIAL - Abnormal; Notable for the following:    WBC 2.5 (*)     RBC 3.47 (*)     Hemoglobin 9.6 (*)     HCT 29.2 (*)     RDW 17.7 (*)     Platelets 58 (*)     Neutro Abs 1.1 (*)     Monocytes Relative 14 (*)     Eosinophils  Relative 8 (*)     All other components within normal limits  PROTIME-INR  URINALYSIS, ROUTINE W REFLEX MICROSCOPIC  COMPREHENSIVE METABOLIC PANEL  ETHANOL  URINE RAPID DRUG SCREEN (HOSP PERFORMED)   Ct Head Wo Contrast  04/27/2012  *RADIOLOGY REPORT*  Clinical Data: fatigue, altered mental status  CT HEAD WITHOUT CONTRAST   Technique:  Contiguous axial images were obtained from the base of the skull through the vertex without contrast.  Comparison: None.  Findings: No skull fracture is noted.  Paranasal sinuses and mastoid air cells are unremarkable.  No acute infarction.  No mass lesion is noted on this unenhanced scan.  No hydrocephalus.  The gray and white matter differentiation is preserved.  No intra or extra-axial fluid collection.  IMPRESSION: No acute intracranial abnormality.   Original Report Authenticated By: Natasha Mead, M.D.     EKG:  Rhythm: normal sinus Rate: 80 Axis: normal Intervals: normal ST segments: NS ST changes. Some t wave flattening in V3 and III   1. Hepatic encephalopathy       MDM  56 year old male with encephalopathy. My initial impression that this is hepatic encephalopathy given patient's past history. He does have asterixis. Will check basic labs, ammonia, and urinalysis. Patient has been unsteady on his feet per his girlfriend and has been falling. We'll check a CT of his head particularly w/ hx of thrombocytopenia although he has no external signs of trauma.  Ammonia 86. Likely etiology. UA still pending. Head CT without acute findings. Anemia, leukopenia and thrombocytopenia chronic.        Raeford Razor, MD 05/02/12 [phone]

## 2012-04-27 NOTE — H&P (Signed)
[facility] History and Physical  [name] YQI:[id number] DOB: 1955-06-08 DOA: 04/27/2012  Referring physician: Dr. Juleen China PCP: Sheila Oats, MD    Chief Complaint: generalized weakness, AMS  HPI: [name] is a 56 y.o. male with PMH of HTN, DM, alcohol abuse, depression and peptic ulcer disease; came to ED with intermittent episodes of mental status changes, generalized weakness and increase lethargy. Patient was recently in the hospital secondary to GI due to peptic ulcer disease and since then symptoms has been slowly progressing and worsening according to family member. Patient major complaint is fatigue and weakness. During examination and interview has difficulty finding words and communicating in full coherent sentences. Patient Has been incontinent, but w/o any other urinary complaints. Pt denies pain anywhere. No fever. No n/v. No BRBPR or melena. Denies CP, SOB or any other complaints.   Review of Systems:  Negative except as otherwise mentioned on HPI.  Past Medical History  Diagnosis Date  . Neuropathy   . Diabetes mellitus   . Bipolar affect, depressed   . Hypertension   . Stroke     Mini stroke about 60yrs ago  . Arthritis    Past Surgical History  Procedure Date  . Fracture surgery     Leg and arm 70yrs ago  . Esophagogastroduodenoscopy 04/04/2012    Procedure: ESOPHAGOGASTRODUODENOSCOPY (EGD);  Surgeon: Hilarie Fredrickson, MD;  Location: [facility] ENDOSCOPY;  Service: Endoscopy;  Laterality: N/A;   Social History:  reports that he has quit smoking. His smoking use included Cigarettes. He has a 15 pack-year smoking history. He has never used smokeless tobacco. He reports that he does not drink alcohol or use illicit drugs. Patient lives with his girlfriend; quit alcohol intake about a month ago. No assistance with ADL's at baseline.   No Known Allergies  Family History  Problem Relation Age of Onset  . Hypotension Mother      Prior to Admission medications    Medication Sig Start Date End Date Taking? Authorizing Provider  albuterol (PROVENTIL HFA;VENTOLIN HFA) 108 (90 BASE) MCG/ACT inhaler Inhale 2 puffs into the lungs every 6 (six) hours as needed. For shortness of breath   Yes Historical Provider, MD  amitriptyline (ELAVIL) 75 MG tablet Take 75 mg by mouth at bedtime.    Yes Historical Provider, MD  aspirin EC 81 MG tablet Take 81 mg by mouth at bedtime.    Yes Historical Provider, MD  cholecalciferol (VITAMIN D) 1000 UNITS tablet Take 1,000 Units by mouth at bedtime.    Yes Historical Provider, MD  furosemide (LASIX) 20 MG tablet Take 1 tablet (20 mg total) by mouth daily. 04/16/12  Yes Tobey Grim, MD  gabapentin (NEURONTIN) 400 MG capsule Take 400 mg by mouth 3 (three) times daily.    Historical Provider, MD  insulin glargine (LANTUS) 100 UNIT/ML injection Inject 30 Units into the skin at bedtime. 04/16/12   Tobey Grim, MD  lisinopril (PRINIVIL,ZESTRIL) 20 MG tablet Take 0.5 tablets (10 mg total) by mouth daily. 04/16/12   Tobey Grim, MD  Multiple Vitamins-Minerals (MULTIVITAMINS THER. W/MINERALS) TABS Take 1 tablet by mouth daily. 04/12/11   Mechele Dawley, NP  oxyCODONE-acetaminophen (PERCOCET/ROXICET) 5-325 MG per tablet Take 1 tablet by mouth every 6 (six) hours as needed. For pain 04/16/12   Tobey Grim, MD  pantoprazole (PROTONIX) 40 MG tablet Take 1 tablet (40 mg total) by mouth 2 (two) times daily before a meal. 04/08/12   Clydia Llano, MD  POTASSIUM PO Take 1 tablet by mouth daily. Over the counter potassium tablet-pt unsure of strength    Historical Provider, MD   Physical Exam: Filed Vitals:   04/27/12 1320 04/27/12 1739  BP: 135/57 127/61  Pulse: 94 83  Temp: 98.2 F (36.8 C)   TempSrc: Oral   Resp: 18 16  SpO2: 98% 96%     General:  AAOX2 (intermittently); no acute distress; pleasantly confused  Eyes: no icterus, no nystagmus, PERRLL, EOMI  ENT: no erythema or exudates inside his mouth, dry  MM.  Neck: no thyromegaly, no bruits, no JVD  Cardiovascular: S1 and S2, no rubs or gallops  Respiratory: CTA bilaterally  Abdomen: soft, NT, positive BS, no guarding  Skin: no rashes or petechiae  Musculoskeletal: no joint swelling or erythema  Psychiatric: Pleasantly confused and mild flact affect; no SI or hallucinations  Neurologic: CN grossly intact, MS, 4/5 bilaterally, no focal motor deficit; patient able to follow commands.  Labs on Admission:  Basic Metabolic Panel:  Lab 04/27/12 3086 04/27/12 1339  NA 134* 137  K 3.5 3.6  CL 100 102  CO2 25 --  GLUCOSE 309* 319*  BUN 10 10  CREATININE 0.69 0.70  CALCIUM 9.3 --  MG -- --  PHOS -- --   Liver Function Tests:  Lab 04/27/12 1517  AST 39*  ALT 26  ALKPHOS 75  BILITOT 1.1  PROT 7.3  ALBUMIN 3.2*    Lab 04/27/12 1517  AMMONIA 86*   CBC:  Lab 04/27/12 1517 04/27/12 1339 04/27/12 1330  WBC 2.5* -- 2.6*  NEUTROABS 1.1* -- --  HGB 9.6* 10.2* 9.9*  HCT 29.2* 30.0* 29.2*  MCV 84.1 -- 84.1  PLT 58* -- 62*    BNP (last 3 results) No results found for this basename: PROBNP:3 in the last 8760 hours CBG:  Lab 04/27/12 1324  GLUCAP 331*    Radiological Exams on Admission: Ct Head Wo Contrast  04/27/2012  *RADIOLOGY REPORT*  Clinical Data: fatigue, altered mental status  CT HEAD WITHOUT CONTRAST  Technique:  Contiguous axial images were obtained from the base of the skull through the vertex without contrast.  Comparison: None.  Findings: No skull fracture is noted.  Paranasal sinuses and mastoid air cells are unremarkable.  No acute infarction.  No mass lesion is noted on this unenhanced scan.  No hydrocephalus.  The gray and white matter differentiation is preserved.  No intra or extra-axial fluid collection.  IMPRESSION: No acute intracranial abnormality.   Original Report Authenticated By: Natasha Mead, M.D.     Assessment/Plan  1-Altered mental status/encephalopathy: with elevated ammonia level and hx of  alcohol abuse and early cirrhosis. Most likely hepatic encephalopathy. -Will admit to reg bed, provide IVF's resuscitation and supportive care -will start rifaximin -Lactulose given in ED -Will follow clinical response and minimize drugs that can cause changes in his sensorium (narcotics and benzo's) -CT head w/o acute intracranial abnormalities -Will check B12 level, TSH and RPR  2-HTN (hypertension): stable. Will continue lisinopril. Patient with mild hyponatremia and UA suggesting dehydration, will hold lasix for now.    3-Diabetes mellitus:will check A1C and continue lantus and SSI  4-Thrombocytopenia: avoid heparin products and follow platelets levels. No acute bleeding at this point.  5-Weakness: multifactorial; secondary in part from recent anemia, intravascular dehydration and encephalopathy. Will provide IVF's and ask PT to evaluate when patient is more alert.  6-Depression: no SI. Will continue elavil  7-Ulcer, stomach peptic:will continue PPI BID. No further  acute bleeding as per patient reports. Hgb stable.  DVT: scd's.   Code Status: Full Family Communication: no family at bedside Disposition Plan: admit as observation for further evaluation and treatment of his weakness and encephalopathy.  Time spent: >30 minutes  Shakthi Scipio [facility] Pager [phone]  If 7PM-7AM, please contact night-coverage [email or web address] Password [facility] 04/27/2012, 6:34 PM

## 2012-04-27 NOTE — ED Notes (Signed)
Brought to ED for eval of ALOC since being released from the hospital 3 weeks ago after GIB. Pt unable to answer questions without falling asleep. Family states pt has been falling at home. No visible trauma noted. Pt denies painful urination but admits to incontinence.

## 2012-04-27 NOTE — ED Notes (Signed)
Pt pleasantly confused to time, place and situation. Unable to verbalize why he is here. No family at bedside. EDP spoke with pt family at pt arrival to ED

## 2012-04-28 DIAGNOSIS — E118 Type 2 diabetes mellitus with unspecified complications: Secondary | ICD-10-CM

## 2012-04-28 LAB — BASIC METABOLIC PANEL
BUN: 10 mg/dL (ref 6–23)
GFR calc Af Amer: >90 mL/min (ref >90)
GFR calc non Af Amer: >90 mL/min (ref >90)
Potassium: 3.4 mEq/L — ABNORMAL LOW (ref 3.5–5.1)

## 2012-04-28 LAB — CBC
HCT: 27.1 % — ABNORMAL LOW (ref 39.0–52.0)
MCHC: 32.8 g/dL (ref 30.0–36.0)
Platelets: 59 10*3/uL — ABNORMAL LOW (ref 150–400)
RDW: 17.8 % — ABNORMAL HIGH (ref 11.5–15.5)
WBC: 2.8 10*3/uL — ABNORMAL LOW (ref 4.0–10.5)

## 2012-04-28 LAB — GLUCOSE, CAPILLARY
Glucose-Capillary: 141 mg/dL — ABNORMAL HIGH (ref 70–99)
Glucose-Capillary: 304 mg/dL — ABNORMAL HIGH (ref 70–99)
Glucose-Capillary: 247 mg/dL — ABNORMAL HIGH (ref 70–99)

## 2012-04-28 LAB — HEMOGLOBIN A1C: Hgb A1c MFr Bld: 8.4 % — ABNORMAL HIGH (ref <5.7)

## 2012-04-28 LAB — TSH: TSH: 0.44 u[IU]/mL (ref 0.350–4.500)

## 2012-04-28 LAB — RPR: RPR Ser Ql: NONREACTIVE

## 2012-04-28 MED ORDER — POTASSIUM CHLORIDE CRYS ER 20 MEQ PO TBCR
40.0000 meq | EXTENDED_RELEASE_TABLET | ORAL | Status: AC
  Administered 2012-04-28 (×2): 40 meq via ORAL
  Filled 2012-04-28: qty 2

## 2012-04-28 MED ORDER — GLUCERNA SHAKE PO LIQD
237.0000 mL | ORAL | Status: DC
  Administered 2012-04-28 – 2012-04-29 (×2): 237 mL via ORAL

## 2012-04-28 MED ORDER — POTASSIUM CHLORIDE CRYS ER 20 MEQ PO TBCR
EXTENDED_RELEASE_TABLET | ORAL | Status: AC
  Administered 2012-04-28: 40 meq via ORAL
  Filled 2012-04-28: qty 2

## 2012-04-28 MED ORDER — INSULIN GLARGINE 100 UNIT/ML ~~LOC~~ SOLN
30.0000 [IU] | Freq: Every day | SUBCUTANEOUS | Status: DC
  Administered 2012-04-28: 30 [IU] via SUBCUTANEOUS

## 2012-04-28 NOTE — Progress Notes (Signed)
[facility] PROGRESS NOTE  [name] ZOX:[id number] DOB: [birth year] DOA: 04/27/2012 PCP: Sheila Oats, MD  Assessment/Plan: 1-Altered mental status/encephalopathy: with elevated ammonia level and hx of alcohol abuse and early cirrhosis. Most likely hepatic encephalopathy.  -Will continue rifaximin -Check ammonia level in am again -replete electrolytes (K low due to home diuretics and also lactulose given) -B12, folic acid, RPR and TSH WNL.  2-HTN (hypertension): stable. Will continue lisinopril. Patient with mild hyponatremia and UA suggesting dehydration, will continue holding lasix for now.   3-Diabetes mellitus uncontrolled: A1C 8.4; will increase lantus to 30 units and continue SSI   4-Thrombocytopenia: avoid heparin products and follow platelets levels. No acute bleeding at this point.   5-Weakness: multifactorial; secondary in part from recent anemia, intravascular dehydration and encephalopathy. Will provide IVF's and ask PT to evaluate. But he feel stronger already.  6-Depression: no SI. Will continue elavil   7-Ulcer, stomach peptic: will continue PPI BID. No further acute bleeding as per patient reports. Hgb stable.   DVT: scd's.  Code Status: Full Family Communication: girlfriend/significant other at bedside Disposition Plan: home in am most likely   Consultants:  none  HPI/Subjective: Feeling a lot better, and per family member almost back to baseline. Patient is afebrile and AAOX3 today. More coherent and denying any acute complaints.  Objective: Filed Vitals:   04/27/12 1946 04/27/12 2158 04/28/12 0548 04/28/12 1342  BP: 158/85 150/75 123/69 123/66  Pulse: 95  76 81  Temp: 98.1 F (36.7 C)  98.5 F (36.9 C) 98.1 F (36.7 C)  TempSrc: Oral  Oral Oral  Resp: 18  20 18   Height: 5\' 11"  (1.803 m)     Weight: 102.5 kg (225 lb 15.5 oz)     SpO2: 99%  98% 99%    Intake/Output Summary (Last 24 hours) at 04/28/12 1744 Last data filed at 04/28/12  1300  Gross per 24 hour  Intake 1701.66 ml  Output      0 ml  Net 1701.66 ml   Filed Weights   04/27/12 1946  Weight: 102.5 kg (225 lb 15.5 oz)    Exam:   General:  AAOX3, no acute distress; feeling better  Cardiovascular: S1 and S2, no rubs or gallops  Respiratory: CTA bilaterally  Abdomen: soft, NT, ND, positive BS  Neuro: non focal  Data Reviewed: Basic Metabolic Panel:  Lab 04/28/12 8119 04/27/12 1945 04/27/12 1517 04/27/12 1339  NA 138 -- 134* 137  K 3.4* -- 3.5 3.6  CL 106 -- 100 102  CO2 24 -- 25 --  GLUCOSE 180* -- 309* 319*  BUN 10 -- 10 10  CREATININE 0.73 -- 0.69 0.70  CALCIUM 8.6 -- 9.3 --  MG -- 1.7 -- --  PHOS -- 3.3 -- --   Liver Function Tests:  Lab 04/27/12 1517  AST 39*  ALT 26  ALKPHOS 75  BILITOT 1.1  PROT 7.3  ALBUMIN 3.2*    Lab 04/27/12 1517  AMMONIA 86*   CBC:  Lab 04/28/12 0540 04/27/12 1517 04/27/12 1339 04/27/12 1330  WBC 2.8* 2.5* -- 2.6*  NEUTROABS -- 1.1* -- --  HGB 8.9* 9.6* 10.2* 9.9*  HCT 27.1* 29.2* 30.0* 29.2*  MCV 84.2 84.1 -- 84.1  PLT 59* 58* -- 62*   CBG:  Lab 04/28/12 1707 04/28/12 1208 04/28/12 0741 04/27/12 2200 04/27/12 1324  GLUCAP 304* 236* 141* 264* 331*      Studies: Ct Head Wo Contrast  04/27/2012  *RADIOLOGY REPORT*  Clinical Data: fatigue,  altered mental status  CT HEAD WITHOUT CONTRAST  Technique:  Contiguous axial images were obtained from the base of the skull through the vertex without contrast.  Comparison: None.  Findings: No skull fracture is noted.  Paranasal sinuses and mastoid air cells are unremarkable.  No acute infarction.  No mass lesion is noted on this unenhanced scan.  No hydrocephalus.  The gray and white matter differentiation is preserved.  No intra or extra-axial fluid collection.  IMPRESSION: No acute intracranial abnormality.   Original Report Authenticated By: Natasha Mead, M.D.     Scheduled Meds:   . amitriptyline  75 mg Oral QHS  . feeding supplement  237 mL Oral  Q24H  . folic acid  1 mg Oral Daily  . gabapentin  300 mg Oral TID  . insulin aspart  0-15 Units Subcutaneous TID WC  . insulin glargine  20 Units Subcutaneous QHS  . lactulose  20 g Oral Once  . lisinopril  10 mg Oral Daily  . multivitamin with minerals  1 tablet Oral Daily  . pantoprazole  40 mg Oral BID AC  . potassium chloride  40 mEq Oral Q4H  . rifaximin  550 mg Oral BID  . thiamine  100 mg Oral Daily   Continuous Infusions:   . [EXPIRED] sodium chloride 100 mL/hr at 04/27/12 2155    Time spent: >30 minutes    Thedford Bunton  [facility] Pager [phone]. If 8PM-8AM, please contact night-coverage at [email or web address], password [facility] 04/28/2012, 5:44 PM  LOS: 1 day

## 2012-04-28 NOTE — Progress Notes (Signed)
INITIAL ADULT NUTRITION ASSESSMENT Date: 04/28/2012   Time: 8:23 AM  Reason for Assessment: Malnutrition Screening  INTERVENTION: 1. Glucerna Shake po daily, each supplement provides 220 kcal and 10 grams of protein. 2. RD to continue to follow nutrition care plan  DOCUMENTATION CODES Per approved criteria  -Obesity Unspecified    ASSESSMENT: Male 56 y.o.  Dx: Altered mental status  Hx:  Past Medical History  Diagnosis Date  . Neuropathy   . Diabetes mellitus   . Bipolar affect, depressed   . Hypertension   . Stroke     Mini stroke about 3yrs ago  . Arthritis    Past Surgical History  Procedure Date  . Fracture surgery     Leg and arm 30yrs ago  . Esophagogastroduodenoscopy 04/04/2012    Procedure: ESOPHAGOGASTRODUODENOSCOPY (EGD);  Surgeon: Hilarie Fredrickson, MD;  Location: [facility] ENDOSCOPY;  Service: Endoscopy;  Laterality: N/A;   Related Meds:     . amitriptyline  75 mg Oral QHS  . folic acid  1 mg Oral Daily  . gabapentin  300 mg Oral TID  . insulin aspart  0-15 Units Subcutaneous TID WC  . insulin glargine  20 Units Subcutaneous QHS  . lactulose  20 g Oral Once  . lisinopril  10 mg Oral Daily  . multivitamin with minerals  1 tablet Oral Daily  . pantoprazole  40 mg Oral BID AC  . rifaximin  550 mg Oral BID  . thiamine  100 mg Oral Daily   Ht: 5\' 11"  (180.3 cm)  Wt: 225 lb 15.5 oz (102.5 kg)  Ideal Wt: 172 lb/78.2 kg % Ideal Wt: 131%  Wt Readings from Last 15 Encounters:  04/27/12 225 lb 15.5 oz (102.5 kg)  04/16/12 230 lb (104.327 kg)  04/08/12 260 lb 9.3 oz (118.2 kg)  04/08/12 260 lb 9.3 oz (118.2 kg)  03/28/12 243 lb (110.224 kg)  03/15/12 229 lb (103.874 kg)  04/07/11 241 lb (109.317 kg)  04/07/11 230 lb (104.327 kg)  Usual Wt: 230 - 245 lb % Usual Wt: 98%  Body mass index is 31.52 kg/(m^2). Obese Class I.  Labs:  CMP     Component Value Date/Time   NA 138 04/28/2012 0540   K 3.4* 04/28/2012 0540   CL 106 04/28/2012 0540   CO2 24 04/28/2012  0540   GLUCOSE 180* 04/28/2012 0540   BUN 10 04/28/2012 0540   CREATININE 0.73 04/28/2012 0540   CREATININE 0.62 04/16/2012 2015   CALCIUM 8.6 04/28/2012 0540   PROT 7.3 04/27/2012 1517   ALBUMIN 3.2* 04/27/2012 1517   AST 39* 04/27/2012 1517   ALT 26 04/27/2012 1517   ALKPHOS 75 04/27/2012 1517   BILITOT 1.1 04/27/2012 1517   GFRNONAA >90 04/28/2012 0540   GFRAA >90 04/28/2012 0540   CBG (last 3)   Basename 04/28/12 0741 04/27/12 2200 04/27/12 1324  GLUCAP 141* 264* 331*    Intake/Output Summary (Last 24 hours) at 04/28/12 0826 Last data filed at 04/28/12 1191  Gross per 24 hour  Intake 1101.66 ml  Output      0 ml  Net 1101.66 ml  BM: 12/5  Diet Order: Carb Control Medium (1600 - 2000)  Supplements/Tube Feeding: none  IVF:    [EXPIRED] sodium chloride Last Rate: 100 mL/hr at 04/27/12 2155   Estimated Nutritional Needs:   Kcal: 2100 - 2300 kcal Protein:  105 - 115 grams protein Fluid:  >2 liters daily  Pt with PMHx of HTN, DM, alcohol abuse,  depression and PUD. Admitted with AMS/encephalopathy. Noted pt was recently admitted for GIB. Pt with hx of ETOH, has had periods of being sober but recently started to drink again. Does state that he eats 3 meals per day despite drinking because food sobers him up some so that he can drink more ETOH.   Pt is currently on Carbohydrate Modified Medium diet. Eating 100% of meals.   Pt is at nutrition risk given hx of extensive alcohol abuse.  NUTRITION DIAGNOSIS: Predicted suboptimal nutrient intake r/t alcohol abuse AEB excessive carbohydrate intake and likely inadequate protein intake.  MONITORING/EVALUATION(Goals): Goal: Pt to meet >/= 90% of their estimated nutrition needs Monitor: weight trends, lab trends, I/O's, PO intake, supplement tolerance  EDUCATION NEEDS: -No education needs identified at this time  Jarold Motto MS, RD, LDN Pager: [phone] After-hours pager: [phone]

## 2012-04-29 LAB — AMMONIA: Ammonia: 146 umol/L — ABNORMAL HIGH (ref 11–60)

## 2012-04-29 LAB — BASIC METABOLIC PANEL
BUN: 9 mg/dL (ref 6–23)
Creatinine, Ser: 0.73 mg/dL (ref 0.50–1.35)
GFR calc Af Amer: >90 mL/min (ref >90)
GFR calc non Af Amer: >90 mL/min (ref >90)
Potassium: 3.9 mEq/L (ref 3.5–5.1)

## 2012-04-29 LAB — GLUCOSE, CAPILLARY: Glucose-Capillary: 250 mg/dL — ABNORMAL HIGH (ref 70–99)

## 2012-04-29 MED ORDER — RIFAXIMIN 550 MG PO TABS: 550.0000 mg | ORAL_TABLET | Freq: Two times a day (BID) | ORAL | Status: DC

## 2012-04-29 MED ORDER — OXYCODONE-ACETAMINOPHEN 5-325 MG PO TABS: 1.0000 | ORAL_TABLET | Freq: Three times a day (TID) | ORAL | Status: DC | PRN

## 2012-04-29 MED ORDER — ALPRAZOLAM 0.5 MG PO TABS: 1.0000 mg | ORAL_TABLET | Freq: Two times a day (BID) | ORAL | Status: DC | PRN

## 2012-04-29 MED ORDER — LACTULOSE 10 GM/15ML PO SOLN: 20.0000 g | Freq: Every day | ORAL | Status: DC

## 2012-04-29 MED ORDER — ASPIRIN EC 81 MG PO TBEC: 81.0000 mg | DELAYED_RELEASE_TABLET | Freq: Every day | ORAL | Status: DC

## 2012-04-29 MED ORDER — INSULIN GLARGINE 100 UNIT/ML ~~LOC~~ SOLN: 35.0000 [IU] | Freq: Every day | SUBCUTANEOUS | Status: DC

## 2012-04-29 MED ORDER — ALBUTEROL SULFATE HFA 108 (90 BASE) MCG/ACT IN AERS: 2.0000 | INHALATION_SPRAY | Freq: Four times a day (QID) | RESPIRATORY_TRACT | Status: DC | PRN

## 2012-04-29 MED ORDER — GABAPENTIN 300 MG PO CAPS: 300.0000 mg | ORAL_CAPSULE | Freq: Three times a day (TID) | ORAL | Status: DC

## 2012-04-29 NOTE — Progress Notes (Signed)
NURSING PROGRESS NOTE  [name] [id number] Discharge Data: 04/29/2012 1:05 PM Attending Provider: Vassie Loll, MD WUJ:[email or web address], MD     Creed Copper to be D/C'd Home per MD order.  Discussed with the patient and his wife the After Visit Summary and all questions fully answered. All IV's discontinued with no bleeding noted. All belongings returned to patient for patient to take home.   Last Vital Signs:  Blood pressure 122/64, pulse 79, temperature 97.4 F (36.3 C), temperature source Oral, resp. rate 16, height 5\' 11"  (1.803 m), weight 102.5 kg (225 lb 15.5 oz), SpO2 97.00%.  Discharge Medication List   Medication List     As of 04/29/2012  1:05 PM    TAKE these medications         albuterol 108 (90 BASE) MCG/ACT inhaler   Commonly known as: PROVENTIL HFA;VENTOLIN HFA   Inhale 2 puffs into the lungs every 6 (six) hours as needed for wheezing or shortness of breath.      ALPRAZolam 0.5 MG tablet   Commonly known as: XANAX   Take 2 tablets (1 mg total) by mouth every 12 (twelve) hours as needed for anxiety. For anxiety      amitriptyline 75 MG tablet   Commonly known as: ELAVIL   Take 75 mg by mouth at bedtime.      aspirin EC 81 MG tablet   Take 1 tablet (81 mg total) by mouth at bedtime. On hold for the next 2 weeks      cholecalciferol 1000 UNITS tablet   Commonly known as: VITAMIN D   Take 1,000 Units by mouth at bedtime.      furosemide 20 MG tablet   Commonly known as: LASIX   Take 20 mg by mouth daily.      gabapentin 300 MG capsule   Commonly known as: NEURONTIN   Take 1 capsule (300 mg total) by mouth 3 (three) times daily.      insulin glargine 100 UNIT/ML injection   Commonly known as: LANTUS   Inject 35 Units into the skin at bedtime.      lactulose 10 GM/15ML solution   Commonly known as: CHRONULAC   Take 30 mLs (20 g total) by mouth daily.      lisinopril 20 MG tablet   Commonly known as: PRINIVIL,ZESTRIL   Take 10 mg by mouth daily.       multivitamin with minerals Tabs   Take 1 tablet by mouth daily.      oxyCODONE-acetaminophen 5-325 MG per tablet   Commonly known as: PERCOCET/ROXICET   Take 1 tablet by mouth every 8 (eight) hours as needed. For pain      pantoprazole 40 MG tablet   Commonly known as: PROTONIX   Take 40 mg by mouth 2 (two) times daily.      POTASSIUM PO   Take 1 tablet by mouth daily. Over the counter potassium tablet-pt unsure of strength      rifaximin 550 MG Tabs   Commonly known as: XIFAXAN   Take 1 tablet (550 mg total) by mouth 2 (two) times daily.        [name], Elmarie Mainland, RN

## 2012-04-29 NOTE — Progress Notes (Signed)
Physical Therapy Evaluation Patient Details Name: [name] MRN: [id number] DOB: 05/22/1956 Today's Date: 04/29/2012 Time: 6962-9528 PT Time Calculation (min): 14 min  PT Assessment / Plan / Recommendation Clinical Impression  56 yo male admitted AMS, likely hepatic encephalopathy, which has improved greatly since admission;   On PT eval pt presents at modified independent to Supervision level for all mobility, including a flight of steps;   OK for dc home from PT standpoint;   No forther PT needs noted; will sign off    PT Assessment  Patent does not need any further PT services    Follow Up Recommendations  No PT follow up    Does the patient have the potential to tolerate intense rehabilitation      Barriers to Discharge        Equipment Recommendations  None recommended by PT    Recommendations for Other Services Other (comment) [facility] for chronic disease management)   Frequency      Precautions / Restrictions Precautions Precautions: None Restrictions Weight Bearing Restrictions: No   Pertinent Vitals/Pain no apparent distress       Mobility  Bed Mobility Bed Mobility: Supine to Sit;Sitting - Scoot to Edge of Bed Supine to Sit: 6: Modified independent (Device/Increase time);With rails Sitting - Scoot to Edge of Bed: 7: Independent Details for Bed Mobility Assistance: Smooth transitions Transfers Transfers: Sit to Stand;Stand to Sit Sit to Stand: 5: Supervision;7: Independent Stand to Sit: 7: Independent Details for Transfer Assistance: Cues to self-monitor for dizziness, activity tol; Pt safely self-monitoring Ambulation/Gait Ambulation/Gait Assistance: 6: Modified independent (Device/Increase time) Ambulation Distance (Feet): 100 Feet Assistive device: None Ambulation/Gait Assistance Details: No loss of balance; Somewhat slow gait speed, but overall independent with household distances Gait Pattern: Within Functional Limits Gait velocity: slightly  decr Stairs: Yes Stairs Assistance: 4: Min guard;5: Supervision Stairs Assistance Details (indicate cue type and reason): minguard progressing to supervision mostly for safety with flight of steps Stair Management Technique: One rail Right;Alternating pattern;Step to pattern (step-to descending) Number of Stairs: 12     Shoulder Instructions     Exercises     PT Diagnosis:    PT Problem List:   PT Treatment Interventions:     PT Goals    Visit Information  Last PT Received On: 04/29/12 Assistance Needed: +1    Subjective Data  Subjective: Agreeable to amb; feels much better; wants to get home to dogs Patient Stated Goal: Home today   Prior Functioning  Home Living Lives With: Significant other Available Help at Discharge: Family;Available PRN/intermittently Type of Home: House Home Access: Level entry Home Layout: One level Home Adaptive Equipment: None Prior Function Level of Independence: Independent Able to Take Stairs?: Yes Driving:  (decr vision) Vocation: On disability Communication Communication: No difficulties    Cognition  Overall Cognitive Status: Appears within functional limits for tasks assessed/performed Arousal/Alertness: Awake/alert Orientation Level: Appears intact for tasks assessed Behavior During Session: [facility] for tasks performed    Extremity/Trunk Assessment Right Upper Extremity Assessment RUE ROM/Strength/Tone: [facility] for tasks assessed Left Upper Extremity Assessment LUE ROM/Strength/Tone: [facility] for tasks assessed Right Lower Extremity Assessment RLE ROM/Strength/Tone: [facility] for tasks assessed Left Lower Extremity Assessment LLE ROM/Strength/Tone: [facility] for tasks assessed Trunk Assessment Trunk Assessment: Normal   Balance    End of Session PT - End of Session Activity Tolerance: Patient tolerated treatment well Patient left: in chair;with call bell/phone within reach (eating breakfast) Nurse Communication: Mobility status  GP Functional  Assessment Tool Used:  Clinical Judgement Functional Limitation: Mobility: Walking and moving around Mobility: Walking and Moving Around Current Status [phone]): 0 percent impaired, limited or restricted Mobility: Walking and Moving Around Goal Status [phone]): 0 percent impaired, limited or restricted Mobility: Walking and Moving Around Discharge Status [phone]): 0 percent impaired, limited or restricted   [facility] [place], [place] [phone]  04/29/2012, 8:58 AM

## 2012-04-29 NOTE — Discharge Summary (Signed)
Physician Discharge Summary  [name] JXB:[id number] DOB: Jan 21, 1956 DOA: 04/27/2012  PCP: Sheila Oats, MD  Admit date: 04/27/2012 Discharge date: 04/29/2012  Time spent: >30 minutes  Recommendations for Outpatient Follow-up:  -Keep yourself well hydrated -take medications as prescribed -Follow with PCP in 1-2 weeks (will need CBC to follow Hgb level and also BMET to follow electrolytes) -Has been discharged on lactulose and rifaximin (adjust as needed for encephalopathy) -No alcohol intake -Watch the amount of protein intake in your diet  -Follow a low carbohydrates diet  Discharge Diagnoses:  Principal Problem:  *Altered mental status Active Problems:  HTN (hypertension)  Diabetes mellitus  Thrombocytopenia  Weakness  Depression  Ulcer, stomach peptic   Discharge Condition: stable and improved. Patient discharge home AAOX3 and totally coherent. No weakness and feeling better than baseline. He will follow with PCP in 2 weeks, was advised to take medications as prescribed and to keep himself hydrated.  Diet recommendation: low carbohydrates and heart healthy  Filed Weights   04/27/12 1946  Weight: 102.5 kg (225 lb 15.5 oz)    History of present illness:  56 y.o. male with PMH of HTN, DM, alcohol abuse, depression and peptic ulcer disease; came to ED with intermittent episodes of mental status changes, generalized weakness and increase lethargy. Patient was recently in the hospital secondary to GI due to peptic ulcer disease and since then symptoms has been slowly progressing and worsening according to family member. Patient major complaint is fatigue and weakness. During examination and interview has difficulty finding words and communicating in full coherent sentences. Patient Has been incontinent, but w/o any other urinary complaints. Pt denies pain anywhere. No fever. No n/v. No BRBPR or melena. Denies CP, SOB or any other complaints.   Hospital Course:  1-Altered  mental status/encephalopathy: with elevated ammonia level and hx of alcohol abuse and findins of early cirrhosis on recent admission. Most likely hepatic encephalopathy.  -Patient mentation complete clear now after lactulose and rifaximin. -Will discharge on daily lactulose, BID rifaximin and low protein diet -patient encouraged to continue avoiding any alcohol consumption and to follow with PCP in 1-2 weeks -B12, folic acid, RPR and TSH WNL.  -Will continue low dose of lasix daily.  2-HTN (hypertension): stable. Will resume home medication regimen and will encourage to follow a heart healthy diet.  3-Diabetes mellitus uncontrolled: A1C 8.4;  -Lantus has been increased to 35 units daily and patient advised to follow low carb diet. -follow up with PCP for further medication adjustments.  4-Thrombocytopenia: No heparin products use during this admission. Patient advised to avoid NSAID's and to continue alcohol abstinence.  5-Weakness: multifactorial; secondary in part from recent anemia, intravascular dehydration and encephalopathy. Resolved now. No Physical therapy needs appreciated at this moment.   6-Depression: no SI. Will continue elavil   7-Ulcer, stomach peptic: will continue PPI BID. No further bleeding as per patient reports since last admisison. Hgb stable.   Procedures:  CT head w/o contrast (no acute intracranial abnormalities)  Consultations:  PT  Discharge Exam: Filed Vitals:   04/28/12 0548 04/28/12 1342 04/28/12 2045 04/29/12 0427  BP: 123/69 123/66 129/70 122/64  Pulse: 76 81 78 79  Temp: 98.5 F (36.9 C) 98.1 F (36.7 C) 98 F (36.7 C) 97.4 F (36.3 C)  TempSrc: Oral Oral Oral Oral  Resp: 20 18 16 16   Height:      Weight:      SpO2: 98% 99% 100% 97%    General: NAD, afebrile; AAOX3 Cardiovascular:  S1 and S2, no rubs or gallops Respiratory: CTA bilaterally Abdomen: soft, NT, ND, positive BS Extremities:no edema or cyanosis Neuro: AAOX3; MS 5/5, CN  intact; follow commands properly and w/o any acute motor or sensory deficit  Discharge Instructions  Discharge Orders    Future Orders Please Complete By Expires   Diet - low sodium heart healthy      Discharge instructions      Comments:   -Keep yourself well hydrated -take medications as prescribed -Follow with PCP in 1-2 weeks -No alcohol intake -Watch the amount of protein intake in your diet  -Follow a low carbohydrates diet       Medication List     As of 04/29/2012 12:40 PM    TAKE these medications         albuterol 108 (90 BASE) MCG/ACT inhaler   Commonly known as: PROVENTIL HFA;VENTOLIN HFA   Inhale 2 puffs into the lungs every 6 (six) hours as needed for wheezing or shortness of breath.      ALPRAZolam 0.5 MG tablet   Commonly known as: XANAX   Take 2 tablets (1 mg total) by mouth every 12 (twelve) hours as needed for anxiety. For anxiety      amitriptyline 75 MG tablet   Commonly known as: ELAVIL   Take 75 mg by mouth at bedtime.      aspirin EC 81 MG tablet   Take 1 tablet (81 mg total) by mouth at bedtime. On hold for the next 2 weeks      cholecalciferol 1000 UNITS tablet   Commonly known as: VITAMIN D   Take 1,000 Units by mouth at bedtime.      furosemide 20 MG tablet   Commonly known as: LASIX   Take 20 mg by mouth daily.      gabapentin 300 MG capsule   Commonly known as: NEURONTIN   Take 1 capsule (300 mg total) by mouth 3 (three) times daily.      insulin glargine 100 UNIT/ML injection   Commonly known as: LANTUS   Inject 35 Units into the skin at bedtime.      lactulose 10 GM/15ML solution   Commonly known as: CHRONULAC   Take 30 mLs (20 g total) by mouth daily.      lisinopril 20 MG tablet   Commonly known as: PRINIVIL,ZESTRIL   Take 10 mg by mouth daily.      multivitamin with minerals Tabs   Take 1 tablet by mouth daily.      oxyCODONE-acetaminophen 5-325 MG per tablet   Commonly known as: PERCOCET/ROXICET   Take 1 tablet by  mouth every 8 (eight) hours as needed. For pain      pantoprazole 40 MG tablet   Commonly known as: PROTONIX   Take 40 mg by mouth 2 (two) times daily.      POTASSIUM PO   Take 1 tablet by mouth daily. Over the counter potassium tablet-pt unsure of strength      rifaximin 550 MG Tabs   Commonly known as: XIFAXAN   Take 1 tablet (550 mg total) by mouth 2 (two) times daily.           Follow-up Information    Follow up with DEFAULT,PROVIDER, MD. Schedule an appointment as soon as possible for a visit in 2 weeks.   Contact information:   [address] [place] [address] [phone]           The results of  significant diagnostics from this hospitalization (including imaging, microbiology, ancillary and laboratory) are listed below for reference.    Significant Diagnostic Studies: Ct Head Wo Contrast  04/27/2012  *RADIOLOGY REPORT*  Clinical Data: fatigue, altered mental status  CT HEAD WITHOUT CONTRAST  Technique:  Contiguous axial images were obtained from the base of the skull through the vertex without contrast.  Comparison: None.  Findings: No skull fracture is noted.  Paranasal sinuses and mastoid air cells are unremarkable.  No acute infarction.  No mass lesion is noted on this unenhanced scan.  No hydrocephalus.  The gray and white matter differentiation is preserved.  No intra or extra-axial fluid collection.  IMPRESSION: No acute intracranial abnormality.   Original Report Authenticated By: Natasha Mead, M.D.    Dg Chest Port 1 View  04/06/2012  *RADIOLOGY REPORT*  Clinical Data: Endotracheal removal.  PORTABLE CHEST - 1 VIEW  Comparison: 11/13  Findings: Endotracheal tube has been removed.  Right internal jugular central line remains in place with its tip at the level of the azygos vein.  Heart mediastinal shadows remain normal.  The vascularity is normal.  Lungs are clear.  No collapse or effusions.  IMPRESSION: Endotracheal tube removed.  Central line remains in place.  No  active cardiopulmonary disease evident.   Original Report Authenticated By: Paulina Fusi, M.D.    Dg Chest Port 1 View  04/04/2012  *RADIOLOGY REPORT*  Clinical Data: Placement of right IJ central venous line  PORTABLE CHEST - 1 VIEW  Comparison: Portable chest x-ray from earlier today  Findings: The tip of the endotracheal tube is approximately 4.0 cm above the carina.  A right IJ central venous line is now present overlying the region of the upper SVC.  No pneumothorax is seen. The lungs are not quite as well aerated.  Cardiomegaly is stable.  IMPRESSION:  1.  Right IJ central venous line tip overlies the upper SVC.  No pneumothorax. 2.  Endotracheal tube tip 4.0 cm above the carina. 3.  Slightly diminished aeration.   Original Report Authenticated By: Dwyane Dee, M.D.    Dg Chest Port 1 View  04/04/2012  *RADIOLOGY REPORT*  Clinical Data: Syncope  PORTABLE CHEST - 1 VIEW  Comparison: 02/09/2012  Findings: Lungs are clear. No pleural effusion or pneumothorax.  Cardiomediastinal silhouette is within normal limits.  IMPRESSION: No evidence of acute cardiopulmonary disease.   Original Report Authenticated By: Charline Bills, M.D.     Labs: Basic Metabolic Panel:  Lab 04/29/12 1610 04/28/12 0540 04/27/12 1945 04/27/12 1517 04/27/12 1339  NA 138 138 -- 134* 137  K 3.9 3.4* -- 3.5 3.6  CL 107 106 -- 100 102  CO2 21 24 -- 25 --  GLUCOSE 205* 180* -- 309* 319*  BUN 9 10 -- 10 10  CREATININE 0.73 0.73 -- 0.69 0.70  CALCIUM 8.6 8.6 -- 9.3 --  MG -- -- 1.7 -- --  PHOS -- -- 3.3 -- --   Liver Function Tests:  Lab 04/27/12 1517  AST 39*  ALT 26  ALKPHOS 75  BILITOT 1.1  PROT 7.3  ALBUMIN 3.2*    Lab 04/29/12 0550 04/27/12 1517  AMMONIA 146* 86*   CBC:  Lab 04/28/12 0540 04/27/12 1517 04/27/12 1339 04/27/12 1330  WBC 2.8* 2.5* -- 2.6*  NEUTROABS -- 1.1* -- --  HGB 8.9* 9.6* 10.2* 9.9*  HCT 27.1* 29.2* 30.0* 29.2*  MCV 84.2 84.1 -- 84.1  PLT 59* 58* -- 62*   CBG:  Lab  04/29/12  1146 04/29/12 0739 04/28/12 2046 04/28/12 1707 04/28/12 1208  GLUCAP 250* 173* 247* 304* 236*    Signed:  Velta Rockholt  [facility] 04/29/2012, 12:40 PM

## 2012-06-19 ENCOUNTER — Inpatient Hospital Stay (HOSPITAL_COMMUNITY)
Admission: EM | Admit: 2012-06-19 | Discharge: 2012-06-20 | DRG: 442 | Disposition: A | Discharge status: Home Health | Payer: Medicare PPO | Attending: Internal Medicine | Admitting: Internal Medicine

## 2012-06-19 ENCOUNTER — Encounter (HOSPITAL_COMMUNITY): Payer: Self-pay | Admitting: *Deleted

## 2012-06-19 ENCOUNTER — Emergency Department (HOSPITAL_COMMUNITY): Payer: Medicare PPO

## 2012-06-19 DIAGNOSIS — F3130 Bipolar disorder, current episode depressed, mild or moderate severity, unspecified: Secondary | ICD-10-CM | POA: Diagnosis present

## 2012-06-19 DIAGNOSIS — D6959 Other secondary thrombocytopenia: Secondary | ICD-10-CM | POA: Diagnosis present

## 2012-06-19 DIAGNOSIS — R4182 Altered mental status, unspecified: Secondary | ICD-10-CM

## 2012-06-19 DIAGNOSIS — Z794 Long term (current) use of insulin: Secondary | ICD-10-CM

## 2012-06-19 DIAGNOSIS — D649 Anemia, unspecified: Secondary | ICD-10-CM | POA: Diagnosis present

## 2012-06-19 DIAGNOSIS — D696 Thrombocytopenia, unspecified: Secondary | ICD-10-CM | POA: Diagnosis present

## 2012-06-19 DIAGNOSIS — R5381 Other malaise: Secondary | ICD-10-CM | POA: Diagnosis present

## 2012-06-19 DIAGNOSIS — F1021 Alcohol dependence, in remission: Secondary | ICD-10-CM | POA: Diagnosis present

## 2012-06-19 DIAGNOSIS — IMO0002 Reserved for concepts with insufficient information to code with codable children: Secondary | ICD-10-CM | POA: Diagnosis present

## 2012-06-19 DIAGNOSIS — M129 Arthropathy, unspecified: Secondary | ICD-10-CM | POA: Diagnosis present

## 2012-06-19 DIAGNOSIS — E860 Dehydration: Secondary | ICD-10-CM | POA: Diagnosis present

## 2012-06-19 DIAGNOSIS — Z79899 Other long term (current) drug therapy: Secondary | ICD-10-CM

## 2012-06-19 DIAGNOSIS — K7200 Acute and subacute hepatic failure without coma: Secondary | ICD-10-CM | POA: Diagnosis present

## 2012-06-19 DIAGNOSIS — K7460 Unspecified cirrhosis of liver: Secondary | ICD-10-CM | POA: Diagnosis present

## 2012-06-19 DIAGNOSIS — T424X5A Adverse effect of benzodiazepines, initial encounter: Secondary | ICD-10-CM | POA: Diagnosis present

## 2012-06-19 DIAGNOSIS — G589 Mononeuropathy, unspecified: Secondary | ICD-10-CM | POA: Diagnosis present

## 2012-06-19 DIAGNOSIS — Z8673 Personal history of transient ischemic attack (TIA), and cerebral infarction without residual deficits: Secondary | ICD-10-CM

## 2012-06-19 DIAGNOSIS — Y92009 Unspecified place in unspecified non-institutional (private) residence as the place of occurrence of the external cause: Secondary | ICD-10-CM

## 2012-06-19 DIAGNOSIS — Z87891 Personal history of nicotine dependence: Secondary | ICD-10-CM

## 2012-06-19 DIAGNOSIS — K7290 Hepatic failure, unspecified without coma: Principal | ICD-10-CM

## 2012-06-19 DIAGNOSIS — I10 Essential (primary) hypertension: Secondary | ICD-10-CM | POA: Diagnosis present

## 2012-06-19 DIAGNOSIS — E119 Type 2 diabetes mellitus without complications: Secondary | ICD-10-CM

## 2012-06-19 DIAGNOSIS — T4275XA Adverse effect of unspecified antiepileptic and sedative-hypnotic drugs, initial encounter: Secondary | ICD-10-CM | POA: Diagnosis present

## 2012-06-19 DIAGNOSIS — K279 Peptic ulcer, site unspecified, unspecified as acute or chronic, without hemorrhage or perforation: Secondary | ICD-10-CM | POA: Diagnosis present

## 2012-06-19 DIAGNOSIS — K7030 Alcoholic cirrhosis of liver without ascites: Secondary | ICD-10-CM | POA: Diagnosis present

## 2012-06-19 DIAGNOSIS — K7682 Hepatic encephalopathy: Principal | ICD-10-CM | POA: Diagnosis present

## 2012-06-19 DIAGNOSIS — G8929 Other chronic pain: Secondary | ICD-10-CM | POA: Diagnosis present

## 2012-06-19 DIAGNOSIS — E1165 Type 2 diabetes mellitus with hyperglycemia: Secondary | ICD-10-CM | POA: Diagnosis present

## 2012-06-19 DIAGNOSIS — R531 Weakness: Secondary | ICD-10-CM | POA: Diagnosis present

## 2012-06-19 DIAGNOSIS — E118 Type 2 diabetes mellitus with unspecified complications: Secondary | ICD-10-CM | POA: Diagnosis present

## 2012-06-19 DIAGNOSIS — E7220 Disorder of urea cycle metabolism, unspecified: Secondary | ICD-10-CM | POA: Diagnosis present

## 2012-06-19 HISTORY — DX: Unspecified cirrhosis of liver: K74.60

## 2012-06-19 LAB — CBC WITH DIFFERENTIAL/PLATELET
Basophils Absolute: 0 10*3/uL (ref 0.0–0.1)
Basophils Relative: 2 % — ABNORMAL HIGH (ref 0–1)
HCT: 33.3 % — ABNORMAL LOW (ref 39.0–52.0)
MCHC: 32.7 g/dL (ref 30.0–36.0)
Monocytes Absolute: 0.3 10*3/uL (ref 0.1–1.0)
Neutro Abs: 1.1 10*3/uL — ABNORMAL LOW (ref 1.7–7.7)
Neutrophils Relative %: 46 % (ref 43–77)
RDW: 17.2 % — ABNORMAL HIGH (ref 11.5–15.5)

## 2012-06-19 LAB — URINE MICROSCOPIC-ADD ON

## 2012-06-19 LAB — COMPREHENSIVE METABOLIC PANEL
AST: 48 U/L — ABNORMAL HIGH (ref 0–37)
Albumin: 3.2 g/dL — ABNORMAL LOW (ref 3.5–5.2)
BUN: 8 mg/dL (ref 6–23)
CO2: 23 mEq/L (ref 19–32)
Calcium: 8.7 mg/dL (ref 8.4–10.5)
Chloride: 97 mEq/L (ref 96–112)
Creatinine, Ser: 0.62 mg/dL (ref 0.50–1.35)
Creatinine, Ser: 0.6 mg/dL (ref 0.50–1.35)
GFR calc Af Amer: >90 mL/min (ref >90)
GFR calc non Af Amer: >90 mL/min (ref >90)
Glucose, Bld: 418 mg/dL — ABNORMAL HIGH (ref 70–99)
Total Bilirubin: 1.2 mg/dL (ref 0.3–1.2)

## 2012-06-19 LAB — LACTIC ACID, PLASMA: Lactic Acid, Venous: 2.8 mmol/L — ABNORMAL HIGH (ref 0.5–2.2)

## 2012-06-19 LAB — CBC
HCT: 32 % — ABNORMAL LOW (ref 39.0–52.0)
Hemoglobin: 10.6 g/dL — ABNORMAL LOW (ref 13.0–17.0)
MCH: 27.8 pg (ref 26.0–34.0)
MCV: 84 fL (ref 78.0–100.0)
Platelets: 43 10*3/uL — ABNORMAL LOW (ref 150–400)
RBC: 3.81 MIL/uL — ABNORMAL LOW (ref 4.22–5.81)

## 2012-06-19 LAB — URINALYSIS, ROUTINE W REFLEX MICROSCOPIC
Ketones, ur: NEGATIVE mg/dL
Leukocytes, UA: NEGATIVE
Nitrite: NEGATIVE
Protein, ur: NEGATIVE mg/dL

## 2012-06-19 LAB — TROPONIN I
Troponin I: <0.3 ng/mL (ref <0.30)
Troponin I: <0.3 ng/mL (ref <0.30)

## 2012-06-19 LAB — GLUCOSE, CAPILLARY
Glucose-Capillary: 392 mg/dL — ABNORMAL HIGH (ref 70–99)
Glucose-Capillary: 365 mg/dL — ABNORMAL HIGH (ref 70–99)

## 2012-06-19 MED ORDER — ONDANSETRON HCL 4 MG/2ML IJ SOLN: 4.0000 mg | Freq: Four times a day (QID) | INTRAMUSCULAR | Status: DC | PRN

## 2012-06-19 MED ORDER — SODIUM CHLORIDE 0.9 % IJ SOLN: 3.0000 mL | Freq: Two times a day (BID) | INTRAMUSCULAR | Status: DC

## 2012-06-19 MED ORDER — FOLIC ACID 1 MG PO TABS
1.0000 mg | ORAL_TABLET | Freq: Every day | ORAL | Status: DC
  Administered 2012-06-20: 1 mg via ORAL
  Filled 2012-06-19: qty 1

## 2012-06-19 MED ORDER — VITAMIN B-1 100 MG PO TABS
100.0000 mg | ORAL_TABLET | Freq: Every day | ORAL | Status: DC
  Administered 2012-06-20: 100 mg via ORAL
  Filled 2012-06-19: qty 1

## 2012-06-19 MED ORDER — RIFAXIMIN 550 MG PO TABS
550.0000 mg | ORAL_TABLET | Freq: Three times a day (TID) | ORAL | Status: DC
  Administered 2012-06-19 – 2012-06-20 (×4): 550 mg via ORAL
  Filled 2012-06-19 (×5): qty 1

## 2012-06-19 MED ORDER — THIAMINE HCL 100 MG/ML IJ SOLN
100.0000 mg | Freq: Every day | INTRAMUSCULAR | Status: DC
  Filled 2012-06-19: qty 1

## 2012-06-19 MED ORDER — INSULIN GLARGINE 100 UNIT/ML ~~LOC~~ SOLN
35.0000 [IU] | Freq: Every day | SUBCUTANEOUS | Status: DC
  Administered 2012-06-19: 35 [IU] via SUBCUTANEOUS

## 2012-06-19 MED ORDER — OXYCODONE HCL 5 MG PO TABS: 5.0000 mg | ORAL_TABLET | Freq: Three times a day (TID) | ORAL | Status: DC | PRN

## 2012-06-19 MED ORDER — SODIUM CHLORIDE 0.9 % IJ SOLN
3.0000 mL | INTRAMUSCULAR | Status: DC | PRN
  Administered 2012-06-20: 3 mL via INTRAVENOUS

## 2012-06-19 MED ORDER — LORAZEPAM 1 MG PO TABS: 1.0000 mg | ORAL_TABLET | Freq: Four times a day (QID) | ORAL | Status: DC | PRN

## 2012-06-19 MED ORDER — LORAZEPAM 2 MG/ML IJ SOLN: 0.0000 mg | Freq: Two times a day (BID) | INTRAMUSCULAR | Status: DC

## 2012-06-19 MED ORDER — ADULT MULTIVITAMIN W/MINERALS CH
1.0000 | ORAL_TABLET | Freq: Every day | ORAL | Status: DC
  Administered 2012-06-20: 1 via ORAL
  Filled 2012-06-19: qty 1

## 2012-06-19 MED ORDER — ALPRAZOLAM 0.5 MG PO TABS
1.0000 mg | ORAL_TABLET | Freq: Two times a day (BID) | ORAL | Status: DC | PRN
  Administered 2012-06-20: 1 mg via ORAL
  Filled 2012-06-19 (×2): qty 1

## 2012-06-19 MED ORDER — INSULIN ASPART 100 UNIT/ML ~~LOC~~ SOLN
0.0000 [IU] | Freq: Three times a day (TID) | SUBCUTANEOUS | Status: DC
  Administered 2012-06-19: 15 [IU] via SUBCUTANEOUS
  Administered 2012-06-20: 8 [IU] via SUBCUTANEOUS
  Administered 2012-06-20: 5 [IU] via SUBCUTANEOUS

## 2012-06-19 MED ORDER — LACTULOSE 10 GM/15ML PO SOLN
20.0000 g | Freq: Three times a day (TID) | ORAL | Status: DC
  Administered 2012-06-20: 20 g via ORAL
  Filled 2012-06-19 (×3): qty 30

## 2012-06-19 MED ORDER — LORAZEPAM 2 MG/ML IJ SOLN: 1.0000 mg | Freq: Four times a day (QID) | INTRAMUSCULAR | Status: DC | PRN

## 2012-06-19 MED ORDER — LORAZEPAM 2 MG/ML IJ SOLN: 0.0000 mg | Freq: Four times a day (QID) | INTRAMUSCULAR | Status: DC

## 2012-06-19 MED ORDER — SODIUM CHLORIDE 0.9 % IV SOLN: 250.0000 mL | INTRAVENOUS | Status: DC | PRN

## 2012-06-19 MED ORDER — LEVALBUTEROL HCL 0.63 MG/3ML IN NEBU
0.6300 mg | INHALATION_SOLUTION | Freq: Four times a day (QID) | RESPIRATORY_TRACT | Status: DC | PRN
  Filled 2012-06-19: qty 3

## 2012-06-19 MED ORDER — PANTOPRAZOLE SODIUM 40 MG PO TBEC
40.0000 mg | DELAYED_RELEASE_TABLET | Freq: Two times a day (BID) | ORAL | Status: DC
  Administered 2012-06-19 – 2012-06-20 (×2): 40 mg via ORAL
  Filled 2012-06-19 (×2): qty 1

## 2012-06-19 MED ORDER — ONDANSETRON HCL 4 MG PO TABS: 4.0000 mg | ORAL_TABLET | Freq: Four times a day (QID) | ORAL | Status: DC | PRN

## 2012-06-19 MED ORDER — SODIUM CHLORIDE 0.9 % IJ SOLN
3.0000 mL | Freq: Two times a day (BID) | INTRAMUSCULAR | Status: DC
  Administered 2012-06-19: 3 mL via INTRAVENOUS

## 2012-06-19 MED ORDER — LISINOPRIL 10 MG PO TABS
10.0000 mg | ORAL_TABLET | Freq: Every day | ORAL | Status: DC
  Administered 2012-06-20: 10 mg via ORAL
  Filled 2012-06-19: qty 1

## 2012-06-19 MED ORDER — SODIUM CHLORIDE 0.9 % IV BOLUS (SEPSIS)
1000.0000 mL | Freq: Once | INTRAVENOUS | Status: AC
  Administered 2012-06-19: 1000 mL via INTRAVENOUS

## 2012-06-19 NOTE — Progress Notes (Signed)
Provider on call was contacted due to the patient having some weird behaviors such as when sitting he would began to lean backwards or to the side. Patient was asked by RN when was his last drink and he stated that it was this morning before he came to the emergency room. RN told patient that she would have to let the provider know and then he said he hasn't had a drink since the 17th. Provider on call was notified due to the inconsistency of the patients story and pt was started on CIWA protocol. Will continue to monitor.  Carlisle Enke J. Lendell Caprice RN BSN

## 2012-06-19 NOTE — H&P (Signed)
[facility] History and Physical  [name] YNW:[id number] DOB: 1955-07-11 DOA: 06/19/2012  Referring physician: Stevie Kern, MD  PCP: Default, Provider, MD   Chief Complaint: Altered Mental Status  .  Weakness    HPI:  57 year old male h/o DM, HTN, Etoh, PUD, "early cirrhosis", ?CVA p/w AMS. Patient and girlfriend report slowing of actions and though process over past 3-4 days. Feel like he is occasionally leaning to right side. Has had similar symptoms previously. Hospitalized for 2 days last month. At that time, believed AMS 2/2 liver problems. Patient states he has been taking his lactulose with normal BM's Pt girlfriend reports pt very lethargic, drowsy, sleeps all day, frequent falls x 3-4 days. Pt c/o dizziness especially upon standing. Stated, "my equilibrium is off". Girlfriend states noticed abd increasing in size (even though pt has not noticed), has been taking meds as prescribed, BLE edema is normal for pt.  Pt denies SOB, CP, n/v/d or pain. States has a good appetite. Concern for likely hepatic encephalopathy.  Patient states stroke in past. CT head negative. Neuro exam largely intact except as above. CVA unlikely.  Patient with elevated ammonia.        Review of Systems: negative for the following  Constitutional: Denies fever, chills, diaphoresis, appetite change and fatigue.  HEENT: Denies photophobia, eye pain, redness, hearing loss, ear pain, congestion, sore throat, rhinorrhea, sneezing, mouth sores, trouble swallowing, neck pain, neck stiffness and tinnitus.  Respiratory: Denies SOB, DOE, cough, chest tightness, and wheezing.  Cardiovascular: Positive for leg swelling (chronic). Negative for chest pain.  Gastrointestinal: Negative for nausea, vomiting, abdominal pain and diarrhea.  Genitourinary: Negative for flank pain and difficulty urinating.  Musculoskeletal: Negative for back pain.  Skin: Negative for color change and rash.  Neurological:  Positive for dizziness (described as feeling light headed. especially when standing. no vertigo ) and light-headedness. Negative for vertigo, speech difficulty, numbness and headaches.  Psychiatric/Behavioral: Positive for altered mental status       Past Medical History  Diagnosis Date  . Neuropathy   . Diabetes mellitus   . Bipolar affect, depressed   . Hypertension   . Arthritis   . Stroke     Mini stroke about 87yrs ago  . Cirrhosis      Past Surgical History  Procedure Date  . Fracture surgery     Leg and arm 25yrs ago  . Esophagogastroduodenoscopy 04/04/2012    Procedure: ESOPHAGOGASTRODUODENOSCOPY (EGD);  Surgeon: Hilarie Fredrickson, MD;  Location: [facility] ENDOSCOPY;  Service: Endoscopy;  Laterality: N/A;      Social History:  reports that he has quit smoking. He has never used smokeless tobacco. He reports that he does not drink alcohol or use illicit drugs.    No Known Allergies  Family History  Problem Relation Age of Onset  . Hypotension Mother      Prior to Admission medications   Medication Sig Start Date End Date Taking? Authorizing Provider  ALPRAZolam Prudy Feeler) 0.5 MG tablet Take 2 tablets (1 mg total) by mouth every 12 (twelve) hours as needed for anxiety. For anxiety 04/29/12  Yes Vassie Loll, MD  cholecalciferol (VITAMIN D) 1000 UNITS tablet Take 1,000 Units by mouth at bedtime.    Yes Historical Provider, MD  insulin glargine (LANTUS) 100 UNIT/ML injection Inject 35 Units into the skin at bedtime. 04/29/12  Yes Vassie Loll, MD  lactulose (CHRONULAC) 10 GM/15ML solution Take 30 mLs (20 g total) by mouth daily. 04/29/12  Yes Vassie Loll, MD  lisinopril (PRINIVIL,ZESTRIL) 20 MG tablet Take 10 mg by mouth daily.   Yes Historical Provider, MD  Multiple Vitamin (MULTIVITAMIN WITH MINERALS) TABS Take 1 tablet by mouth daily.   Yes Historical Provider, MD  oxyCODONE-acetaminophen (PERCOCET/ROXICET) 5-325 MG per tablet Take 1 tablet by mouth every 8 (eight) hours as  needed. For pain 04/29/12  Yes Vassie Loll, MD  pantoprazole (PROTONIX) 40 MG tablet Take 40 mg by mouth 2 (two) times daily.   Yes Historical Provider, MD  POTASSIUM PO Take 1 tablet by mouth daily. Over the counter potassium tablet-pt unsure of strength   Yes Historical Provider, MD     Physical Exam: Filed Vitals:   06/19/12 1243 06/19/12 1315 06/19/12 1400 06/19/12 1440  BP: 119/59 138/70 120/68 125/58  Pulse: 85 85 84   Temp: 97.9 F (36.6 C)     TempSrc: Oral     Resp: 16 12 18 17   SpO2: 96% 98% 96% 96%     Constitutional: Vital signs reviewed. Patient is a well-developed and well-nourished in no acute distress and cooperative with exam. Alert and oriented x3.  Head: Normocephalic and atraumatic  Ear: TM normal bilaterally  Mouth: no erythema or exudates, MMM  Eyes: PERRL, EOMI, conjunctivae normal, No scleral icterus.  Neck: Supple, Trachea midline normal ROM, No JVD, mass, thyromegaly, or carotid bruit present.  Cardiovascular: RRR, S1 normal, S2 normal, no MRG, pulses symmetric and intact bilaterally  Pulmonary/Chest: CTAB, no wheezes, rales, or rhonchi  Abdominal: Soft. Non-tender, non-distended, bowel sounds are normal, no masses, organomegaly, or guarding present.  GU: no CVA tenderness Abdominal: Soft. He exhibits distension. There is no tenderness. There is no guarding.  Musculoskeletal: He exhibits edema. He exhibits no tenderness.  Neurological: He is alert and oriented to person, place, and time. He has normal strength. No cranial nerve deficit or sensory deficit. GCS eye subscore is 4. GCS verbal subscore is 5. GCS motor subscore is 6.  Mild asterixis. Upon standing, patient able to take 1-2 steps. Feels unstable Skin: Warm, dry and intact. No rash, cyanosis, or clubbing.  Psychiatric: Normal mood and affect. speech and behavior is normal. Judgment and thought content normal. Cognition and memory are normal.       Labs on Admission:    Basic Metabolic  Panel:  Lab 06/19/12 1356 06/19/12 1309  NA 133* 133*  K 4.2 4.3  CL 98 97  CO2 23 22  GLUCOSE 418* 425*  BUN 8 8  CREATININE 0.60 0.62  CALCIUM 8.7 8.9  MG -- --  PHOS -- --   Liver Function Tests:  Lab 06/19/12 1356 06/19/12 1309  AST 43* 48*  ALT 26 29  ALKPHOS 91 98  BILITOT 1.2 1.2  PROT 6.9 7.4  ALBUMIN 3.0* 3.2*   No results found for this basename: LIPASE:5,AMYLASE:5 in the last 168 hours  Lab 06/19/12 1357  AMMONIA 150*   CBC:  Lab 06/19/12 1356 06/19/12 1309  WBC 2.2* 2.3*  NEUTROABS -- 1.1*  HGB 10.6* 10.9*  HCT 32.0* 33.3*  MCV 84.0 84.1  PLT 43* 40*   Cardiac Enzymes:  Lab 06/19/12 1405  CKTOTAL --  CKMB --  CKMBINDEX --  TROPONINI <0.30    BNP (last 3 results) No results found for this basename: PROBNP:3 in the last 8760 hours    CBG: No results found for this basename: GLUCAP:5 in the last 168 hours  Radiological Exams on Admission: Dg Chest 2 View  06/19/2012  *RADIOLOGY REPORT*  Clinical Data:  Altered mental status with weakness.  Previous smoker.  CHEST - 2 VIEW  Comparison: 04/06/2012  Findings: Cardiomegaly.  Normal cardiac and mediastinal silhouette otherwise.  Clear lung fields.  No bony abnormality.  IMPRESSION: No active cardiopulmonary disease.  No significant change from priors.   Original Report Authenticated By: Davonna Belling, M.D.    Ct Head Wo Contrast  06/19/2012  *RADIOLOGY REPORT*  Clinical Data: Altered mental status  CT HEAD WITHOUT CONTRAST  Technique:  Contiguous axial images were obtained from the base of the skull through the vertex without contrast. Study was obtained within 24 hours of patient arrival at the emergency department.  Comparison: April 27, 2012  Findings:  There is a mild diffuse atrophy.  There is no mass, hemorrhage, extra-axial fluid collection, or midline shift.  There is minimal small vessel disease in the centra semiovale bilaterally.  There is no acute appearing infarct.  Gray-white compartments  are otherwise normal.  Bony calvarium appears intact.  The mastoid air cells are clear.  IMPRESSION: Mild diffuse atrophy with minimal small vessel disease.  No apparent mass, hemorrhage, or acute infarct.   Original Report Authenticated By: Bretta Bang, M.D.     EKG: Independently reviewed.    Assessment/Plan   1-Altered mental status/encephalopathy: with elevated ammonia level and hx of alcohol abuse and findins of early cirrhosis on recent admission. Most likely hepatic encephalopathy.  Restart lactulose and rifaximin.  - -B12, folic acid, RPR and TSH WNL during last admission.  -Will continue low dose of lasix daily. CT head shows minimal small vessel disease no acute infarct   2-HTN (hypertension): stable. Will resume home medication regimen and will encourage to follow a heart healthy diet.  3-Diabetes mellitus uncontrolled: A1C 8.4; start the patient on sliding scale insulin -Lantus has been increased to 35 units daily and patient advised to follow low carb diet.    4-Thrombocytopenia related to alcohol use: No heparin products use during this admission. Patient advised to avoid NSAID's and to continue alcohol abstinence.   5-Weakness: multifactorial; secondary in part from recent anemia, intravascular dehydration and encephalopathy. Resolved now.  6-Depression: no SI. Will continue elavil   7-Ulcer, stomach peptic: will continue PPI BID. No further bleeding as per patient reports since last admisison. Hgb stable.      Code Status:   full Family Communication: bedside Disposition Plan: admit   Time spent: 70 mins   [facility] Pager [phone]  If 7PM-7AM, please contact night-coverage [email or web address] Password [facility] 06/19/2012, 3:49 PM

## 2012-06-19 NOTE — ED Notes (Signed)
Pt girlfriend reports pt very lethargic, drowsy, sleeps all day, frequent falls x 3-4 days. Pt c/o dizziness especially upon standing. Stated, "my equilibrium is off". Girlfriend states noticed abd increasing in size (even though pt has not noticed), has been taking meds as prescribed, BLE edema is normal for pt.  Pt denies SOB, CP, n/v/d or pain. States has a good appetite.

## 2012-06-19 NOTE — ED Notes (Signed)
Pt girlfriend had to leave. Contact number: cell number [phone]

## 2012-06-19 NOTE — ED Notes (Signed)
Admitting MD at bedside.

## 2012-06-19 NOTE — ED Notes (Signed)
Pt was d/c'd from hospital in December for similar s/s.  Weakness, leaning to R, altered mental status.  Girlfriend states that since then he has been improving.  However, for the past 3 days he has been having difficulty feeding himself, leaning to the R, etc.  Presently cbg of 392.  Pt unable to state correct date and year, however, does recognize girlfriend.

## 2012-06-19 NOTE — ED Notes (Signed)
Patient transported to X-ray 

## 2012-06-19 NOTE — ED Provider Notes (Signed)
History     CSN: [id number]  Arrival date & time 06/19/12  1237   First MD Initiated Contact with Patient 06/19/12 1257      Chief Complaint  Patient presents with  . Altered Mental Status  . Weakness    several days    (Consider location/radiation/quality/duration/timing/severity/associated sxs/prior treatment) HPI Comments: 57 y/o male h/o DM, HTN, Etoh, PUD, "early cirrhosis", ?CVA p/w AMS. Patient and girlfriend report slowing of actions and though process over past 3-4 days. Feel like he is occasionally leaning to right side. Has had similar symptoms previously. Hospitalized for 2 days last month. At that time, believed AMS 2/2 liver problems. Patient states he has been taking his lactulose with normal BM's. LBM this AM. Patient is a 57 y.o. male presenting with altered mental status. The history is provided by the patient and a friend.  Altered Mental Status This is a recurrent problem. The current episode started in the past 7 days. The problem occurs constantly. The problem has been gradually worsening. Pertinent negatives include no abdominal pain, chest pain, chills, congestion, coughing, fever, headaches, nausea, numbness, rash, urinary symptoms, vertigo or vomiting. Nothing aggravates the symptoms. Treatments tried: states he has been taking his home lactulose.    Past Medical History  Diagnosis Date  . Neuropathy   . Diabetes mellitus   . Bipolar affect, depressed   . Hypertension   . Arthritis   . Stroke     Mini stroke about 34yrs ago  . Cirrhosis     Past Surgical History  Procedure Date  . Fracture surgery     Leg and arm 12yrs ago  . Esophagogastroduodenoscopy 04/04/2012    Procedure: ESOPHAGOGASTRODUODENOSCOPY (EGD);  Surgeon: Hilarie Fredrickson, MD;  Location: [facility] ENDOSCOPY;  Service: Endoscopy;  Laterality: N/A;    Family History  Problem Relation Age of Onset  . Hypotension Mother     History  Substance Use Topics  . Smoking status: Former Smoker -- 0.5  packs/day for 30 years  . Smokeless tobacco: Never Used     Comment: quit   . Alcohol Use: No     Comment: heavy (approx 12/day)............. quit  1 week ago      Review of Systems  Constitutional: Negative for fever and chills.  HENT: Negative for congestion and rhinorrhea.   Eyes: Negative for pain and visual disturbance.  Respiratory: Negative for cough and shortness of breath.   Cardiovascular: Positive for leg swelling (chronic). Negative for chest pain.  Gastrointestinal: Negative for nausea, vomiting, abdominal pain and diarrhea.  Genitourinary: Negative for flank pain and difficulty urinating.  Musculoskeletal: Negative for back pain.  Skin: Negative for color change and rash.  Neurological: Positive for dizziness (described as feeling light headed. especially when standing. no vertigo ) and light-headedness. Negative for vertigo, speech difficulty, numbness and headaches.  Psychiatric/Behavioral: Positive for altered mental status.  All other systems reviewed and are negative.    Allergies  Review of patient's allergies indicates no known allergies.  Home Medications   Current Outpatient Rx  Name  Route  Sig  Dispense  Refill  . ALPRAZOLAM 0.5 MG PO TABS   Oral   Take 2 tablets (1 mg total) by mouth every 12 (twelve) hours as needed for anxiety. For anxiety   30 tablet   0   . VITAMIN D 1000 UNITS PO TABS   Oral   Take 1,000 Units by mouth at bedtime.          Marland Kitchen  INSULIN GLARGINE 100 UNIT/ML [place] SOLN   Subcutaneous   Inject 35 Units into the skin at bedtime.   10 mL      . LACTULOSE 10 GM/15ML PO SOLN   Oral   Take 30 mLs (20 g total) by mouth daily.   240 mL   1   . LISINOPRIL 20 MG PO TABS   Oral   Take 10 mg by mouth daily.         . ADULT MULTIVITAMIN W/MINERALS CH   Oral   Take 1 tablet by mouth daily.         . OXYCODONE-ACETAMINOPHEN 5-325 MG PO TABS   Oral   Take 1 tablet by mouth every 8 (eight) hours as needed. For pain          . PANTOPRAZOLE SODIUM 40 MG PO TBEC   Oral   Take 40 mg by mouth 2 (two) times daily.         Marland Kitchen POTASSIUM PO   Oral   Take 1 tablet by mouth daily. Over the counter potassium tablet-pt unsure of strength           BP 125/58  Pulse 84  Temp 97.9 F (36.6 C) (Oral)  Resp 17  SpO2 96%  Physical Exam  Nursing note and vitals reviewed. Constitutional: He is oriented to person, place, and time. He appears well-developed and well-nourished. No distress.  HENT:  Head: Normocephalic and atraumatic.  Eyes: Conjunctivae normal are normal. Right eye exhibits no discharge. Left eye exhibits no discharge.  Neck: No tracheal deviation present.  Cardiovascular: Normal rate, regular rhythm, normal heart sounds and intact distal pulses.   Pulmonary/Chest: Effort normal and breath sounds normal. No stridor. No respiratory distress. He has no wheezes. He has no rales.  Abdominal: Soft. He exhibits distension. There is no tenderness. There is no guarding.  Musculoskeletal: He exhibits edema. He exhibits no tenderness.  Neurological: He is alert and oriented to person, place, and time. He has normal strength. No cranial nerve deficit or sensory deficit. GCS eye subscore is 4. GCS verbal subscore is 5. GCS motor subscore is 6.       Mild asterixis. Upon standing, patient able to take 1-2 steps. Feels unstable.   Skin: Skin is warm and dry.    ED Course  Procedures (including critical care time)  Labs Reviewed  CBC WITH DIFFERENTIAL - Abnormal; Notable for the following:    WBC 2.3 (*)     RBC 3.96 (*)     Hemoglobin 10.9 (*)     HCT 33.3 (*)     RDW 17.2 (*)     Platelets 40 (*)  PLATELET COUNT CONFIRMED BY SMEAR   Neutro Abs 1.1 (*)     Monocytes Relative 14 (*)     Eosinophils Relative 10 (*)     Basophils Relative 2 (*)     All other components within normal limits  COMPREHENSIVE METABOLIC PANEL - Abnormal; Notable for the following:    Sodium 133 (*)     Glucose, Bld 425 (*)      Albumin 3.2 (*)     AST 48 (*)     All other components within normal limits  CBC - Abnormal; Notable for the following:    WBC 2.2 (*)     RBC 3.81 (*)     Hemoglobin 10.6 (*)     HCT 32.0 (*)     RDW 17.1 (*)  Platelets 43 (*)  PLATELET COUNT CONFIRMED BY SMEAR   All other components within normal limits  COMPREHENSIVE METABOLIC PANEL - Abnormal; Notable for the following:    Sodium 133 (*)     Glucose, Bld 418 (*)     Albumin 3.0 (*)     AST 43 (*)     All other components within normal limits  LACTIC ACID, PLASMA - Abnormal; Notable for the following:    Lactic Acid, Venous 2.8 (*)     All other components within normal limits  AMMONIA - Abnormal; Notable for the following:    Ammonia 150 (*)     All other components within normal limits  TROPONIN I  URINALYSIS, ROUTINE W REFLEX MICROSCOPIC   Dg Chest 2 View  06/19/2012  *RADIOLOGY REPORT*  Clinical Data: Altered mental status with weakness.  Previous smoker.  CHEST - 2 VIEW  Comparison: 04/06/2012  Findings: Cardiomegaly.  Normal cardiac and mediastinal silhouette otherwise.  Clear lung fields.  No bony abnormality.  IMPRESSION: No active cardiopulmonary disease.  No significant change from priors.   Original Report Authenticated By: Davonna Belling, M.D.    Ct Head Wo Contrast  06/19/2012  *RADIOLOGY REPORT*  Clinical Data: Altered mental status  CT HEAD WITHOUT CONTRAST  Technique:  Contiguous axial images were obtained from the base of the skull through the vertex without contrast. Study was obtained within 24 hours of patient arrival at the emergency department.  Comparison: April 27, 2012  Findings:  There is a mild diffuse atrophy.  There is no mass, hemorrhage, extra-axial fluid collection, or midline shift.  There is minimal small vessel disease in the centra semiovale bilaterally.  There is no acute appearing infarct.  Gray-white compartments are otherwise normal.  Bony calvarium appears intact.  The mastoid air cells  are clear.  IMPRESSION: Mild diffuse atrophy with minimal small vessel disease.  No apparent mass, hemorrhage, or acute infarct.   Original Report Authenticated By: Bretta Bang, M.D.      1. Hepatic encephalopathy       MDM   57 y/o male p/w AMS. Slowly worsening. Girlfriend states patient not so much confused, as he is just moving and acting slower. Similar to prior admission. HDS, af. NAD. Asterixis. Concern for likely hepatic encephalopathy. Patient states stroke in past. CT head negative. Neuro exam largely intact except as above. CVA unlikely. Patient with elevated ammonia. Hospitalist consulted for admission.  Labs and imaging reviewed by myself and considered in medical decision making if ordered. Imaging interpreted by radiology.   Discussed case with Dr. Anitra Lauth who is in agreement with assessment and plan.         Stevie Kern, MD 06/19/12 [phone]

## 2012-06-20 DIAGNOSIS — R4182 Altered mental status, unspecified: Secondary | ICD-10-CM

## 2012-06-20 DIAGNOSIS — K7460 Unspecified cirrhosis of liver: Secondary | ICD-10-CM

## 2012-06-20 DIAGNOSIS — E7220 Disorder of urea cycle metabolism, unspecified: Secondary | ICD-10-CM | POA: Diagnosis present

## 2012-06-20 DIAGNOSIS — K7200 Acute and subacute hepatic failure without coma: Secondary | ICD-10-CM | POA: Diagnosis present

## 2012-06-20 DIAGNOSIS — E119 Type 2 diabetes mellitus without complications: Secondary | ICD-10-CM

## 2012-06-20 LAB — TROPONIN I
Troponin I: <0.3 ng/mL (ref <0.30)
Troponin I: <0.3 ng/mL (ref <0.30)

## 2012-06-20 LAB — COMPREHENSIVE METABOLIC PANEL
AST: 41 U/L — ABNORMAL HIGH (ref 0–37)
Albumin: 2.7 g/dL — ABNORMAL LOW (ref 3.5–5.2)
Alkaline Phosphatase: 86 U/L (ref 39–117)
BUN: 8 mg/dL (ref 6–23)
Chloride: 102 mEq/L (ref 96–112)
Potassium: 3.7 mEq/L (ref 3.5–5.1)
Sodium: 134 mEq/L — ABNORMAL LOW (ref 135–145)
Total Protein: 6.4 g/dL (ref 6.0–8.3)

## 2012-06-20 LAB — CBC
HCT: 30.2 % — ABNORMAL LOW (ref 39.0–52.0)
MCH: 27.4 pg (ref 26.0–34.0)
MCV: 83.7 fL (ref 78.0–100.0)
RDW: 17.2 % — ABNORMAL HIGH (ref 11.5–15.5)
WBC: 2.5 10*3/uL — ABNORMAL LOW (ref 4.0–10.5)

## 2012-06-20 LAB — AMMONIA: Ammonia: 140 umol/L — ABNORMAL HIGH (ref 11–60)

## 2012-06-20 LAB — GLUCOSE, CAPILLARY
Glucose-Capillary: 211 mg/dL — ABNORMAL HIGH (ref 70–99)
Glucose-Capillary: 277 mg/dL — ABNORMAL HIGH (ref 70–99)

## 2012-06-20 MED ORDER — RIFAXIMIN 550 MG PO TABS: 550.0000 mg | ORAL_TABLET | Freq: Two times a day (BID) | ORAL | Status: DC

## 2012-06-20 MED ORDER — LACTULOSE 10 GM/15ML PO SOLN: 20.0000 g | Freq: Two times a day (BID) | ORAL | Status: DC

## 2012-06-20 MED ORDER — THIAMINE HCL 100 MG PO TABS: 100.0000 mg | ORAL_TABLET | Freq: Every day | ORAL | Status: DC

## 2012-06-20 MED ORDER — OXYCODONE-ACETAMINOPHEN 5-325 MG PO TABS: 1.0000 | ORAL_TABLET | Freq: Two times a day (BID) | ORAL | Status: DC

## 2012-06-20 MED ORDER — FOLIC ACID 1 MG PO TABS: 1.0000 mg | ORAL_TABLET | Freq: Every day | ORAL | Status: DC

## 2012-06-20 MED ORDER — ALPRAZOLAM 0.5 MG PO TABS: 0.5000 mg | ORAL_TABLET | Freq: Two times a day (BID) | ORAL | Status: DC | PRN

## 2012-06-20 NOTE — Discharge Summary (Addendum)
Physician Discharge Summary  [name] ZOX:[id number] DOB: 02/04/1956 DOA: 06/19/2012  PCP: [name]  Admit date: 06/19/2012 Discharge date: 06/20/2012  Time spent: 45 minutes  Recommendations for Outpatient Follow-up:   Patient needs to completely abstain from alcohol.  He needs to reduce his opiate and benzodiazapine intake as his liver can not metabolize the current doses.    The patient and his significant other need education about liver disease and how to manage the symptoms.  His diabetes is uncontrolled.  His insulin will need to be adjusted by his PCP.  He needs referral to an outpatient psychiatrist for treatment of bi-polar and depression.    Discharge Diagnoses:  Active Problems:  Altered mental status  Diabetes mellitus type 2 with complications  Thrombocytopenia  Weakness  Alcoholic Cirrhosis  Serum ammonia increased  Acute hepatic encephalopathy   Discharge Condition: stable, alert  Diet recommendation: Heart Healthy, carb modified.  Filed Weights   06/19/12 1800  Weight: 106.187 kg (234 lb 1.6 oz)    History of present illness:  57 year old male h/o DM, HTN, Etoh, PUD, "early cirrhosis", ?CVA p/w AMS. Patient and girlfriend report slowing of actions and though process over past 3-4 days. Feel like he is occasionally leaning to right side. Has had similar symptoms previously. Hospitalized for 2 days last month. At that time, believed AMS 2/2 liver problems. Patient states he has been taking his lactulose with normal BM's.  Pt girlfriend reports pt very lethargic, drowsy, sleeps all day, frequent falls x 3-4 days. Pt c/o dizziness especially upon standing. Stated, "my equilibrium is off". Girlfriend states noticed abd increasing in size (even though pt has not noticed), has been taking meds as prescribed, BLE edema is normal for pt.  Pt denies SOB, CP, n/v/d or pain. States has a good appetite. Concern for likely hepatic encephalopathy.  Patient  states stroke in past. CT head negative. Neuro exam largely intact except as above. CVA unlikely. Patient with elevated ammonia (150)   Hospital Course:  Altered Mental Status - Likely has a combination of hepatic encephalopathy and polypharmacy. [name] was started on lactulose and rifaximin therapy.  His urinalysis did not show signs of infection, his chest xray and  head CT was negative. This morning during rounds, patient is completely awake and alert. He is ambulating without any difficulty. Per the patient's significant other [name]) the patient has not had an alcoholic beverage in 3 months.  He does take benzodiazepines for bipolar disorder and percocet for chronic pain.  Per her history it sounds as though he has been becoming encephalopathic intermittently for the past two months.  Ms. [name] was educated about using increased doses of lactulose as necessary to clear early encephalopathy.   [name] was evaluated by physical therapy who recommended home health physical therapy.  I will order home health PT and RN.  The RN is to monitor for symptoms of encephalopathy and provide education regarding liver disease and how to manage the symptoms.  The patient will follow up with his primary care physician, Dr. [name].  Patient will need to cut back on benzodiazepines and narcotics (we have decreased dosing and frequency), continue with lactulose and rifaximin.   Discharge Exam: Filed Vitals:   06/19/12 1800 06/19/12 2104 06/20/12 0618 06/20/12 1000  BP: 135/77 124/70 143/79 148/78  Pulse: 85 89 89 94  Temp: 98.1 F (36.7 C) 98.6 F (37 C) 98.2 F (36.8 C)   TempSrc: Oral Oral Oral  Resp: 16 18 20    Height: 5\' 10"  (1.778 m)     Weight: 106.187 kg (234 lb 1.6 oz)     SpO2: 98% 95% 97%     General: Awake, calm, no longer confused.  No icterus, no jaundice, No asterixis Cardiovascular: rrr no m/r/g Respiratory: cta no w/c/r Abdomen:  Soft, slight distention (likely  fluid), nt, +BS, no obvious masses Lower extremities:  1+ pitting edema     Discharge Instructions      Discharge Orders    Future Orders Please Complete By Expires   Diet - low sodium heart healthy      Increase activity slowly          Medication List     As of 06/20/2012 12:16 PM    STOP taking these medications         POTASSIUM PO      TAKE these medications         ALPRAZolam 0.5 MG tablet   Commonly known as: XANAX   Take 1 tablet (0.5 mg total) by mouth every 12 (twelve) hours as needed for anxiety. For anxiety      cholecalciferol 1000 UNITS tablet   Commonly known as: VITAMIN D   Take 1,000 Units by mouth at bedtime.      folic acid 1 MG tablet   Commonly known as: FOLVITE   Take 1 tablet (1 mg total) by mouth daily.      insulin glargine 100 UNIT/ML injection   Commonly known as: LANTUS   Inject 35 Units into the skin at bedtime.      lactulose 10 GM/15ML solution   Commonly known as: CHRONULAC   Take 30 mLs (20 g total) by mouth 2 (two) times daily. Dose 2 to 4 times daily - titrate the dose to achieve 2 to 4 soft bowel movements daily.      lisinopril 20 MG tablet   Commonly known as: PRINIVIL,ZESTRIL   Take 10 mg by mouth daily.      multivitamin with minerals Tabs   Take 1 tablet by mouth daily.      oxyCODONE-acetaminophen 5-325 MG per tablet   Commonly known as: PERCOCET/ROXICET   Take 1 tablet by mouth every 12 (twelve) hours. For pain      pantoprazole 40 MG tablet   Commonly known as: PROTONIX   Take 40 mg by mouth 2 (two) times daily.      rifaximin 550 MG Tabs   Commonly known as: XIFAXAN   Take 1 tablet (550 mg total) by mouth 2 (two) times daily.      thiamine 100 MG tablet   Take 1 tablet (100 mg total) by mouth daily.           The results of significant diagnostics from this hospitalization (including imaging, microbiology, ancillary and laboratory) are listed below for reference.    Significant Diagnostic  Studies: Dg Chest 2 View  06/19/2012  *RADIOLOGY REPORT*  Clinical Data: Altered mental status with weakness.  Previous smoker.  CHEST - 2 VIEW  Comparison: 04/06/2012  Findings: Cardiomegaly.  Normal cardiac and mediastinal silhouette otherwise.  Clear lung fields.  No bony abnormality.  IMPRESSION: No active cardiopulmonary disease.  No significant change from priors.   Original Report Authenticated By: Davonna Belling, M.D.    Ct Head Wo Contrast  06/19/2012  *RADIOLOGY REPORT*  Clinical Data: Altered mental status  CT HEAD WITHOUT CONTRAST  Technique:  Contiguous axial images were obtained from  the base of the skull through the vertex without contrast. Study was obtained within 24 hours of patient arrival at the emergency department.  Comparison: April 27, 2012  Findings:  There is a mild diffuse atrophy.  There is no mass, hemorrhage, extra-axial fluid collection, or midline shift.  There is minimal small vessel disease in the centra semiovale bilaterally.  There is no acute appearing infarct.  Gray-white compartments are otherwise normal.  Bony calvarium appears intact.  The mastoid air cells are clear.  IMPRESSION: Mild diffuse atrophy with minimal small vessel disease.  No apparent mass, hemorrhage, or acute infarct.   Original Report Authenticated By: Bretta Bang, M.D.     Labs: Basic Metabolic Panel:  Lab 06/20/12 7829 06/19/12 1356 06/19/12 1309  NA 134* 133* 133*  K 3.7 4.2 4.3  CL 102 98 97  CO2 23 23 22   GLUCOSE 270* 418* 425*  BUN 8 8 8   CREATININE 0.60 0.60 0.62  CALCIUM 8.3* 8.7 8.9  MG -- -- --  PHOS -- -- --   Liver Function Tests:  Lab 06/20/12 0221 06/19/12 1356 06/19/12 1309  AST 41* 43* 48*  ALT 23 26 29   ALKPHOS 86 91 98  BILITOT 1.0 1.2 1.2  PROT 6.4 6.9 7.4  ALBUMIN 2.7* 3.0* 3.2*    Lab 06/20/12 0849 06/19/12 1357  AMMONIA 140* 150*   CBC:  Lab 06/20/12 0221 06/19/12 1356 06/19/12 1309  WBC 2.5* 2.2* 2.3*  NEUTROABS -- -- 1.1*  HGB 9.9* 10.6*  10.9*  HCT 30.2* 32.0* 33.3*  MCV 83.7 84.0 84.1  PLT 43* 43* 40*   Cardiac Enzymes:  Lab 06/20/12 0841 06/20/12 0221 06/19/12 1954 06/19/12 1405  CKTOTAL -- -- -- --  CKMB -- -- -- --  CKMBINDEX -- -- -- --  TROPONINI <0.30 <0.30 <0.30 <0.30   CBG:  Lab 06/20/12 0801 06/19/12 2136 06/19/12 1744 06/19/12 1242  GLUCAP 211* 333* 365* 392*       Signed:  Conley Canal [phone]  [facility] 06/20/2012, 12:16 PM   Attending - Patient seen and examined, admitted with lethargic and altered mental status. Does have underlying liver cirrhosis, ammonia level CL on admission. Also taking Xanax and Percocet. Suspect his altered mental status was just not from hepatic encephalopathy, but was also likely from polypharmacy. This morning he is completely awake and alert. He is been seen by physical therapy services and has ambulated in the hallway. He needs to cut down on his benzodiazepine and narcotic use, and this has been conveyed to both the patient and his significant other. Both of them claim understanding. He is to continue on lactulose and rifaximin. Followup with PCP in the next few weeks.  S Temeca Somma

## 2012-06-20 NOTE — ED Provider Notes (Signed)
I saw and evaluated the patient, reviewed the resident's note and I agree with the findings and plan. Pt with hx of cirrhosis and slowed mental status today and asterixis. Patient denies any falls but states he's had difficulty walking. On exam he is awake and alert but does have slowed mentation. Ammonia today is 150 patient is admitted in lactulose started  [name], MD 06/20/12 1007

## 2012-06-20 NOTE — Care Management Note (Signed)
    Page 1 of 2   06/20/2012     4:27:10 PM   CARE MANAGEMENT NOTE 06/20/2012  Patient:  [name], [name]   Account Number:  [id number]  Date Initiated:  06/20/2012  Documentation initiated by:  Letha Cape  Subjective/Objective Assessment:   dx hepatic encephalopathy  admit- lives with girlfriend.  pta independent.     Action/Plan:   pt eval- hhpt   Anticipated DC Date:  06/20/2012   Anticipated DC Plan:  HOME W HOME HEALTH SERVICES      DC Planning Services  CM consult      [facility] Choice  HOME HEALTH   Choice offered to / List presented to:  [name]        HH arranged  HH-1 RN  HH-2 PT      [facility] agency  [facility]   Status of service:  Completed, signed off Medicare Important Message given?   (If response is "NO", the following Medicare IM given date fields will be blank) Date Medicare IM given:   Date Additional Medicare IM given:    Discharge Disposition:  HOME W HOME HEALTH SERVICES  Per UR Regulation:  Reviewed for med. necessity/level of care/duration of stay  If discussed at Long Length of Stay Meetings, dates discussed:    Comments:  06/18/12 16:24 Letha Cape RN, BSN [phone] patient lives with grilfriend who is a Lawyer.  PTA independent.  Per physical therapy recs hhpt and also needs hhrn. Patient chose [facility] from agency list, referral made to [facility] , Lupita Leash notiified.  Soc will begin 24- 48 hrs post discharge.  Patient for dc today.

## 2012-06-20 NOTE — Progress Notes (Signed)
Utilization review completed. Riah Kehoe, RN, BSN. 

## 2012-06-20 NOTE — Evaluation (Signed)
Physical Therapy Evaluation Patient Details Name: [name] MRN: [id number] DOB: 1955/08/07 Today's Date: 06/20/2012 Time: 0942-1000 PT Time Calculation (min): 18 min  PT Assessment / Plan / Recommendation Clinical Impression  Pt adm with AMS.  Pt with encephalopathy.  Pt now improving.  Initially slightly unsteady with gait but this improved as distance increased.  Recommend HHPT at dc.    PT Assessment  Patient needs continued PT services    Follow Up Recommendations  Home health PT    Does the patient have the potential to tolerate intense rehabilitation      Barriers to Discharge        Equipment Recommendations  None recommended by PT    Recommendations for Other Services     Frequency Min 3X/week    Precautions / Restrictions Precautions Precautions: Fall Restrictions Weight Bearing Restrictions: No   Pertinent Vitals/Pain No pain.      Mobility  Bed Mobility Bed Mobility: Supine to Sit;Sitting - Scoot to Edge of Bed;Sit to Supine Supine to Sit: 6: Modified independent (Device/Increase time);HOB elevated Sitting - Scoot to Edge of Bed: 6: Modified independent (Device/Increase time) Sit to Supine: 6: Modified independent (Device/Increase time);HOB elevated Transfers Transfers: Sit to Stand;Stand to Sit Sit to Stand: 5: Supervision;With upper extremity assist;From bed Stand to Sit: 5: Supervision;With upper extremity assist;To bed Ambulation/Gait Ambulation/Gait Assistance: 4: Min assist;5: Supervision Ambulation Distance (Feet): 400 Feet Assistive device: None Ambulation/Gait Assistance Details: Initially pt with slight stagger and scissored x 1 requiring min A.  As distance incr pt became steadier and no longer required physical assist. Gait Pattern: Scissoring    Shoulder Instructions     Exercises     PT Diagnosis:    PT Problem List: Decreased balance PT Treatment Interventions: Gait training;Functional mobility training;Balance  training;Patient/family education;Therapeutic activities   PT Goals Acute Rehab PT Goals PT Goal Formulation: With patient Time For Goal Achievement: 06/20/12 Potential to Achieve Goals: Good Pt will go Sit to Stand: Independently PT Goal: Sit to Stand - Progress: Goal set today Pt will go Stand to Sit: Independently PT Goal: Stand to Sit - Progress: Goal set today Pt will Ambulate: >150 feet;with modified independence PT Goal: Ambulate - Progress: Goal set today  Visit Information  Last PT Received On: 06/20/12 Assistance Needed: +1    Subjective Data  Subjective: Pt states he feels better after walking some. Patient Stated Goal: Return home   Prior Functioning  Home Living Lives With: Significant other Available Help at Discharge: Family;Available PRN/intermittently Type of Home: House Home Access: Level entry Home Layout: One level Home Adaptive Equipment: None Prior Function Level of Independence: Independent Driving: No Vocation: On disability Communication Communication: No difficulties    Cognition  Overall Cognitive Status: Appears within functional limits for tasks assessed/performed Arousal/Alertness: Awake/alert Orientation Level: Appears intact for tasks assessed Behavior During Session: [facility] for tasks performed    Extremity/Trunk Assessment Right Lower Extremity Assessment RLE ROM/Strength/Tone: [facility] for tasks assessed Left Lower Extremity Assessment LLE ROM/Strength/Tone: [facility] for tasks assessed   Balance Static Standing Balance Static Standing - Balance Support: No upper extremity supported Static Standing - Level of Assistance: 5: Stand by assistance  End of Session PT - End of Session Activity Tolerance: Patient tolerated treatment well Patient left: in bed;with call bell/phone within reach Nurse Communication: Mobility status  GP     [facility] 06/20/2012, 10:40 AM  [facility] PT [phone]

## 2012-06-20 NOTE — Plan of Care (Signed)
Problem: Food- and Nutrition-Related Knowledge Deficit (NB-1.1) Goal: Nutrition education Formal process to instruct or train a patient/client in a skill or to impart knowledge to help patients/clients voluntarily manage or modify food choices and eating behavior to maintain or improve health.  Outcome: Completed/Met Date Met:  06/20/12 RD consulted for "Please just provide written information for this patient's wife regarding appropriate diet. He is a diabetic with Significant liver disease and hepatic encephalopathy with elevated ammonia levels"   RD provided pt with hand out for Carbohydrate Counting from the [employer].  RD provided pt with hand out for Low Protein diet, explained the need to follow a lower protein diet for liver health. Pt states he eats meat at all three meals and usually is a very large portion. RD explained proper portion of protein foods and increasing non-starchy foods to meet kcal needs.  [name] fiancee was not available at time of RD visit, but pt stated he would share information with her.  Pt seems willing to change, expect fair compliance.   Body mass index is 33.59 kg/(m^2). Obesity class 1 Diet: Carb Mod  No additional nutrition interventions warranted at this time. Please re-consult as needed.   Clarene Duke RD, LDN Pager [phone] After Hours pager [phone]

## 2012-06-20 NOTE — Progress Notes (Signed)
[name] to be D/C'd Home per MD order.  Discharge instructions reviewed and discussed with the patient, all questions and concerns answered. Copy of instructions and scripts given to patient and significant other. Patients skin is clean, dry and intact no evidence of skin break down. IV site discontinued and catheter remains intact. Site without signs and symptoms of complications. Dressing and pressure applied.  Patient escorted to car by NT in a wheelchair,  no distress noted upon discharge.  [name] 06/20/2012 4:20 PM

## 2012-07-19 ENCOUNTER — Emergency Department (HOSPITAL_COMMUNITY): Payer: Medicare PPO

## 2012-07-19 ENCOUNTER — Observation Stay (HOSPITAL_COMMUNITY)
Admission: EM | Admit: 2012-07-19 | Discharge: 2012-07-20 | Disposition: A | Discharge status: Home / Self Care | Payer: Medicare PPO | Attending: Internal Medicine | Admitting: Internal Medicine

## 2012-07-19 ENCOUNTER — Encounter (HOSPITAL_COMMUNITY): Payer: Self-pay

## 2012-07-19 DIAGNOSIS — R404 Transient alteration of awareness: Secondary | ICD-10-CM

## 2012-07-19 DIAGNOSIS — Z79899 Other long term (current) drug therapy: Secondary | ICD-10-CM | POA: Insufficient documentation

## 2012-07-19 DIAGNOSIS — R5381 Other malaise: Secondary | ICD-10-CM | POA: Insufficient documentation

## 2012-07-19 DIAGNOSIS — K7460 Unspecified cirrhosis of liver: Secondary | ICD-10-CM

## 2012-07-19 DIAGNOSIS — M129 Arthropathy, unspecified: Secondary | ICD-10-CM | POA: Insufficient documentation

## 2012-07-19 DIAGNOSIS — R531 Weakness: Secondary | ICD-10-CM

## 2012-07-19 DIAGNOSIS — F3130 Bipolar disorder, current episode depressed, mild or moderate severity, unspecified: Secondary | ICD-10-CM | POA: Insufficient documentation

## 2012-07-19 DIAGNOSIS — R4182 Altered mental status, unspecified: Principal | ICD-10-CM | POA: Insufficient documentation

## 2012-07-19 DIAGNOSIS — F1410 Cocaine abuse, uncomplicated: Secondary | ICD-10-CM

## 2012-07-19 DIAGNOSIS — Z8673 Personal history of transient ischemic attack (TIA), and cerebral infarction without residual deficits: Secondary | ICD-10-CM | POA: Insufficient documentation

## 2012-07-19 DIAGNOSIS — F1010 Alcohol abuse, uncomplicated: Secondary | ICD-10-CM

## 2012-07-19 DIAGNOSIS — D696 Thrombocytopenia, unspecified: Secondary | ICD-10-CM

## 2012-07-19 DIAGNOSIS — K7030 Alcoholic cirrhosis of liver without ascites: Secondary | ICD-10-CM | POA: Insufficient documentation

## 2012-07-19 DIAGNOSIS — F411 Generalized anxiety disorder: Secondary | ICD-10-CM | POA: Insufficient documentation

## 2012-07-19 DIAGNOSIS — Z794 Long term (current) use of insulin: Secondary | ICD-10-CM | POA: Insufficient documentation

## 2012-07-19 DIAGNOSIS — E118 Type 2 diabetes mellitus with unspecified complications: Secondary | ICD-10-CM

## 2012-07-19 DIAGNOSIS — F1020 Alcohol dependence, uncomplicated: Secondary | ICD-10-CM | POA: Insufficient documentation

## 2012-07-19 DIAGNOSIS — R400 Somnolence: Secondary | ICD-10-CM

## 2012-07-19 LAB — RAPID URINE DRUG SCREEN, HOSP PERFORMED
Cocaine: NOT DETECTED
Opiates: NOT DETECTED

## 2012-07-19 LAB — COMPREHENSIVE METABOLIC PANEL
Alkaline Phosphatase: 91 U/L (ref 39–117)
BUN: 12 mg/dL (ref 6–23)
Chloride: 99 mEq/L (ref 96–112)
Creatinine, Ser: 0.76 mg/dL (ref 0.50–1.35)
GFR calc Af Amer: >90 mL/min (ref >90)
Glucose, Bld: 261 mg/dL — ABNORMAL HIGH (ref 70–99)
Potassium: 4.4 mEq/L (ref 3.5–5.1)
Total Bilirubin: 1.9 mg/dL — ABNORMAL HIGH (ref 0.3–1.2)

## 2012-07-19 LAB — CBC WITH DIFFERENTIAL/PLATELET
Basophils Relative: 1 % (ref 0–1)
Eosinophils Absolute: 0.1 10*3/uL (ref 0.0–0.7)
HCT: 33.3 % — ABNORMAL LOW (ref 39.0–52.0)
Hemoglobin: 11.2 g/dL — ABNORMAL LOW (ref 13.0–17.0)
Lymphs Abs: 0.7 10*3/uL (ref 0.7–4.0)
MCH: 27.3 pg (ref 26.0–34.0)
MCHC: 33.6 g/dL (ref 30.0–36.0)
Monocytes Absolute: 0.2 10*3/uL (ref 0.1–1.0)
Monocytes Relative: 11 % (ref 3–12)
Neutro Abs: 1.1 10*3/uL — ABNORMAL LOW (ref 1.7–7.7)
Neutrophils Relative %: 53 % (ref 43–77)
RBC: 4.11 MIL/uL — ABNORMAL LOW (ref 4.22–5.81)

## 2012-07-19 LAB — URINALYSIS, ROUTINE W REFLEX MICROSCOPIC
Glucose, UA: >1000 mg/dL — AB
Hgb urine dipstick: NEGATIVE
Leukocytes, UA: NEGATIVE
Specific Gravity, Urine: 1.019 (ref 1.005–1.030)
pH: 6 (ref 5.0–8.0)

## 2012-07-19 LAB — LACTIC ACID, PLASMA: Lactic Acid, Venous: 1.9 mmol/L (ref 0.5–2.2)

## 2012-07-19 LAB — GLUCOSE, CAPILLARY: Glucose-Capillary: 225 mg/dL — ABNORMAL HIGH (ref 70–99)

## 2012-07-19 MED ORDER — SODIUM CHLORIDE 0.9 % IV BOLUS (SEPSIS)
500.0000 mL | Freq: Once | INTRAVENOUS | Status: AC
  Administered 2012-07-19: 500 mL via INTRAVENOUS

## 2012-07-19 MED ORDER — SODIUM CHLORIDE 0.9 % IJ SOLN
3.0000 mL | INTRAMUSCULAR | Status: DC | PRN
  Administered 2012-07-19: 3 mL via INTRAVENOUS

## 2012-07-19 MED ORDER — INSULIN ASPART 100 UNIT/ML ~~LOC~~ SOLN
0.0000 [IU] | Freq: Three times a day (TID) | SUBCUTANEOUS | Status: DC
  Administered 2012-07-20: 3 [IU] via SUBCUTANEOUS

## 2012-07-19 MED ORDER — ALPRAZOLAM 0.5 MG PO TABS
0.5000 mg | ORAL_TABLET | Freq: Two times a day (BID) | ORAL | Status: DC | PRN
  Administered 2012-07-19: 0.5 mg via ORAL
  Filled 2012-07-19: qty 1

## 2012-07-19 MED ORDER — SODIUM CHLORIDE 0.9 % IJ SOLN: 3.0000 mL | Freq: Two times a day (BID) | INTRAMUSCULAR | Status: DC

## 2012-07-19 MED ORDER — LISINOPRIL 10 MG PO TABS
10.0000 mg | ORAL_TABLET | Freq: Every day | ORAL | Status: DC
  Administered 2012-07-20: 10 mg via ORAL
  Filled 2012-07-19 (×2): qty 1

## 2012-07-19 MED ORDER — VITAMIN D3 25 MCG (1000 UNIT) PO TABS
1000.0000 [IU] | ORAL_TABLET | Freq: Every day | ORAL | Status: DC
  Administered 2012-07-19: 1000 [IU] via ORAL
  Filled 2012-07-19 (×2): qty 1

## 2012-07-19 MED ORDER — ADULT MULTIVITAMIN W/MINERALS CH
1.0000 | ORAL_TABLET | Freq: Every day | ORAL | Status: DC
  Administered 2012-07-19 – 2012-07-20 (×2): 1 via ORAL
  Filled 2012-07-19 (×2): qty 1

## 2012-07-19 MED ORDER — PANTOPRAZOLE SODIUM 40 MG PO TBEC
40.0000 mg | DELAYED_RELEASE_TABLET | Freq: Two times a day (BID) | ORAL | Status: DC
  Administered 2012-07-19 – 2012-07-20 (×2): 40 mg via ORAL
  Filled 2012-07-19 (×2): qty 1

## 2012-07-19 MED ORDER — RIFAXIMIN 550 MG PO TABS
550.0000 mg | ORAL_TABLET | Freq: Two times a day (BID) | ORAL | Status: DC
  Administered 2012-07-19 – 2012-07-20 (×2): 550 mg via ORAL
  Filled 2012-07-19 (×3): qty 1

## 2012-07-19 MED ORDER — SODIUM CHLORIDE 0.9 % IV SOLN: 250.0000 mL | INTRAVENOUS | Status: DC | PRN

## 2012-07-19 MED ORDER — FOLIC ACID 1 MG PO TABS
1.0000 mg | ORAL_TABLET | Freq: Every day | ORAL | Status: DC
  Administered 2012-07-19 – 2012-07-20 (×2): 1 mg via ORAL
  Filled 2012-07-19 (×2): qty 1

## 2012-07-19 MED ORDER — LACTULOSE 10 GM/15ML PO SOLN
20.0000 g | Freq: Two times a day (BID) | ORAL | Status: DC
  Administered 2012-07-19 – 2012-07-20 (×2): 20 g via ORAL
  Filled 2012-07-19 (×3): qty 30

## 2012-07-19 MED ORDER — OXYCODONE-ACETAMINOPHEN 5-325 MG PO TABS
1.0000 | ORAL_TABLET | Freq: Two times a day (BID) | ORAL | Status: DC
  Administered 2012-07-19 – 2012-07-20 (×2): 1 via ORAL
  Filled 2012-07-19 (×2): qty 1

## 2012-07-19 MED ORDER — VITAMIN B-1 100 MG PO TABS
100.0000 mg | ORAL_TABLET | Freq: Every day | ORAL | Status: DC
  Administered 2012-07-19 – 2012-07-20 (×2): 100 mg via ORAL
  Filled 2012-07-19 (×2): qty 1

## 2012-07-19 MED ORDER — INSULIN GLARGINE 100 UNIT/ML ~~LOC~~ SOLN
35.0000 [IU] | Freq: Every day | SUBCUTANEOUS | Status: DC
  Administered 2012-07-19: 35 [IU] via SUBCUTANEOUS

## 2012-07-19 MED ORDER — SODIUM CHLORIDE 0.9 % IV SOLN
INTRAVENOUS | Status: DC
  Administered 2012-07-19: 20:00:00 via INTRAVENOUS

## 2012-07-19 NOTE — ED Notes (Signed)
Pt fell yesterday.  Pt did not seek treatment.  Pt fell from standing position, not sure why.  Pt states he feels weak.  Non compliant with diet and insulin meds.

## 2012-07-19 NOTE — H&P (Signed)
PCP:   Lonia Blood, MD   Chief Complaint:  somunlent  HPI: 57 yo male h/o etoh cirrhosis, cocaine abuse, iddm poorly controlled, htn comes in with 48 hours of over sleepiness.  Wife is with him.  He is back to his normal state right now but has had poor po intake for several days.  His glucose has been uncontrolled.  He denies any changes in his chronic pain, no sob, no n/v/d.  No bleeding no cough no dysuria.  He is compliant with his meds.  Last discharge was instructed to dec his xanax to 0.5mg  bid (half of prev dose) and dec his percocet 5/325 to bid from tid which he is doing.  Today he had his f/u appt with dr [name] and was very somunlent and instructed to come to ED for evaluation.  He had PT eval set up for him at home which was done and was deemed not to need any home physical therapy and instructed to join the [facility].  His pain has been well controlled with the decrease dosing that occurred from last hospitalization (less than 2 weeks ago).  No syncopal episodes.    Review of Systems:  Positive and negative as per HPI otherwise all other systems are negative  Past Medical History: Past Medical History  Diagnosis Date  . Neuropathy   . Diabetes mellitus   . Bipolar affect, depressed   . Hypertension   . Arthritis   . Stroke     Mini stroke about 76yrs ago  . Cirrhosis    Past Surgical History  Procedure Laterality Date  . Fracture surgery      Leg and arm 31yrs ago  . Esophagogastroduodenoscopy  04/04/2012    Procedure: ESOPHAGOGASTRODUODENOSCOPY (EGD);  Surgeon: Hilarie Fredrickson, MD;  [facility] ENDOSCOPY;  Service: Endoscopy;  Laterality: N/A;    Medications: Prior to Admission medications   Medication Sig Start Date End Date Taking? Authorizing Provider  ALPRAZolam Prudy Feeler) 0.5 MG tablet Take 1 tablet (0.5 mg total) by mouth every 12 (twelve) hours as needed for anxiety. For anxiety 06/20/12  Yes Tora Kindred York, PA  cholecalciferol (VITAMIN D) 1000 UNITS tablet Take 1,000  Units by mouth at bedtime.    Yes Historical Provider, MD  folic acid (FOLVITE) 1 MG tablet Take 1 tablet (1 mg total) by mouth daily. 06/20/12  Yes Marianne L York, PA  insulin glargine (LANTUS) 100 UNIT/ML injection Inject 35 Units into the skin at bedtime. 04/29/12  Yes Vassie Loll, MD  lactulose (CHRONULAC) 10 GM/15ML solution Take 30 mLs (20 g total) by mouth 2 (two) times daily. Dose 2 to 4 times daily - titrate the dose to achieve 2 to 4 soft bowel movements daily. 06/20/12  Yes Tora Kindred York, PA  lisinopril (PRINIVIL,ZESTRIL) 20 MG tablet Take 10 mg by mouth daily.   Yes Historical Provider, MD  Multiple Vitamin (MULTIVITAMIN WITH MINERALS) TABS Take 1 tablet by mouth daily.   Yes Historical Provider, MD  oxyCODONE-acetaminophen (PERCOCET/ROXICET) 5-325 MG per tablet Take 1 tablet by mouth every 12 (twelve) hours. For pain 06/20/12  Yes Tora Kindred York, PA  pantoprazole (PROTONIX) 40 MG tablet Take 40 mg by mouth 2 (two) times daily.   Yes Historical Provider, MD  rifaximin (XIFAXAN) 550 MG TABS Take 1 tablet (550 mg total) by mouth 2 (two) times daily. 06/20/12  Yes Tora Kindred York, PA  thiamine 100 MG tablet Take 1 tablet (100 mg total) by mouth daily. 06/20/12   Clerance Lav  L York, PA    Allergies:  No Known Allergies  Social History:  reports that he quit smoking about 3 months ago. He has never used smokeless tobacco. He reports that he does not drink alcohol or use illicit drugs.  Family History: Family History  Problem Relation Age of Onset  . Hypotension Mother     Physical Exam: Filed Vitals:   07/19/12 1650 07/19/12 1819  BP: 138/63   Pulse: 80   Temp: 98.2 F (36.8 C) 97.8 F (36.6 C)  TempSrc: Oral Rectal  Resp: 16   SpO2: 94%    General appearance: alert, cooperative and no distress Head: Normocephalic, without obvious abnormality, atraumatic Eyes: negative Nose: Nares normal. Septum midline. Mucosa normal. No drainage or sinus tenderness. Neck: no JVD and  supple, symmetrical, trachea midline Lungs: clear to auscultation bilaterally Heart: regular rate and rhythm, S1, S2 normal, no murmur, click, rub or gallop Abdomen: soft, non-tender; bowel sounds normal; no masses,  no organomegaly Extremities: extremities normal, atraumatic, no cyanosis or edema Pulses: 2+ and symmetric Skin: Skin color, texture, turgor normal. No rashes or lesions Neurologic: Grossly normal    Labs on Admission:   Recent Labs  07/19/12 1725  NA 132*  K 4.4  CL 99  CO2 25  GLUCOSE 261*  BUN 12  CREATININE 0.76  CALCIUM 8.9    Recent Labs  07/19/12 1725  AST 39*  ALT 22  ALKPHOS 91  BILITOT 1.9*  PROT 7.1  ALBUMIN 3.0*    Recent Labs  07/19/12 1725  WBC 2.1*  NEUTROABS 1.1*  HGB 11.2*  HCT 33.3*  MCV 81.0  PLT 39*    Radiological Exams on Admission: Ct Head Wo Contrast  07/19/2012  *RADIOLOGY REPORT*  Clinical Data: Status post fall.  CT HEAD WITHOUT CONTRAST  Technique:  Contiguous axial images were obtained from the base of the skull through the vertex without contrast.  Comparison: 06/15/2012  Findings: The brain has a normal appearance without evidence for hemorrhage, infarction, hydrocephalus, or mass lesion.  There is no extra axial fluid collection.  The skull and paranasal sinuses are normal.  IMPRESSION: No acute intracranial abnormalities noted.   Original Report Authenticated By: Signa Kell, M.D.     Assessment/Plan  57 yo male h/o cirrhosis of the liver, cocaine abuse, uncont iddm with somnulence  Principal Problem:   Somnolence Active Problems:   Diabetes mellitus type 2 with complications   Weakness   Cirrhosis   Cocaine abuse  Ammonia level is normal as is etoh level. uds and ua pending.  No foci of infection.  Seems to be compliant with his xanax/percocet dosing over the last couple of weeks.  No focal neurological deficits now.  Will obs overnight and ck freq neuro cks.  Ct head unrevealing.  Dm is poorly controlled  will add ssi to his lantus dose.  Monitor for any fevers.  PT eval.  Tele monitoring.  Soffia Doshier A 07/19/2012, 8:10 PM

## 2012-07-19 NOTE — ED Provider Notes (Addendum)
History     CSN: [id number]  Arrival date & time 07/19/12  1638   First MD Initiated Contact with Patient 07/19/12 1640      Chief Complaint  Patient presents with  . Fall    (Consider location/radiation/quality/duration/timing/severity/associated sxs/prior treatment) HPI  Patient with multiple health problems including cirrhosis and diabetes who has been having ataxia for several days- gf states gait staggery.  She states this is third or fourth episode of similar with elevated ammonia level.  States first diagnosed with liver failure in November then again in [place] has been well controlled.  Taking lactulose.  She notes elevated bs 400-600.  PMD is [name].  Cough began several days.  No fever noted.  Eating ok until today.  Seen by [name] today- bs 500, states now 200.  Marland Kitchen   Past Medical History  Diagnosis Date  . Neuropathy   . Diabetes mellitus   . Bipolar affect, depressed   . Hypertension   . Arthritis   . Stroke     Mini stroke about 59yrs ago  . Cirrhosis     Past Surgical History  Procedure Laterality Date  . Fracture surgery      Leg and arm 36yrs ago  . Esophagogastroduodenoscopy  04/04/2012    Procedure: ESOPHAGOGASTRODUODENOSCOPY (EGD);  Surgeon: Hilarie Fredrickson, MD;  Location: [facility] ENDOSCOPY;  Service: Endoscopy;  Laterality: N/A;    Family History  Problem Relation Age of Onset  . Hypotension Mother     History  Substance Use Topics  . Smoking status: Former Smoker -- 0.50 packs/day for 30 years    Quit date: 04/06/2012  . Smokeless tobacco: Never Used     Comment: quit   . Alcohol Use: No     Comment: heavy (approx 12/day)............. quit  1 week ago      Review of Systems  Unable to perform ROS   Allergies  Review of patient's allergies indicates no known allergies.  Home Medications   Current Outpatient Rx  Name  Route  Sig  Dispense  Refill  . ALPRAZolam (XANAX) 0.5 MG tablet   Oral   Take 1 tablet (0.5 mg total) by mouth  every 12 (twelve) hours as needed for anxiety. For anxiety   30 tablet   0   . cholecalciferol (VITAMIN D) 1000 UNITS tablet   Oral   Take 1,000 Units by mouth at bedtime.          . folic acid (FOLVITE) 1 MG tablet   Oral   Take 1 tablet (1 mg total) by mouth daily.   30 tablet   0   . insulin glargine (LANTUS) 100 UNIT/ML injection   Subcutaneous   Inject 35 Units into the skin at bedtime.   10 mL      . lactulose (CHRONULAC) 10 GM/15ML solution   Oral   Take 30 mLs (20 g total) by mouth 2 (two) times daily. Dose 2 to 4 times daily - titrate the dose to achieve 2 to 4 soft bowel movements daily.   240 mL   1   . lisinopril (PRINIVIL,ZESTRIL) 20 MG tablet   Oral   Take 10 mg by mouth daily.         . Multiple Vitamin (MULTIVITAMIN WITH MINERALS) TABS   Oral   Take 1 tablet by mouth daily.         Marland Kitchen oxyCODONE-acetaminophen (PERCOCET/ROXICET) 5-325 MG per tablet   Oral   Take 1 tablet  by mouth every 12 (twelve) hours. For pain   30 tablet      . pantoprazole (PROTONIX) 40 MG tablet   Oral   Take 40 mg by mouth 2 (two) times daily.         . rifaximin (XIFAXAN) 550 MG TABS   Oral   Take 1 tablet (550 mg total) by mouth 2 (two) times daily.   28 tablet   0   . thiamine 100 MG tablet   Oral   Take 1 tablet (100 mg total) by mouth daily.   30 tablet   0     BP 138/63  Pulse 80  Temp(Src) 98.2 F (36.8 C) (Oral)  Resp 16  SpO2 94%  Physical Exam  Nursing note and vitals reviewed. Constitutional: He is oriented to person, place, and time. He appears well-developed and well-nourished.  HENT:  Head: Normocephalic and atraumatic.  Right Ear: External ear normal.  Left Ear: External ear normal.  Nose: Nose normal.  Mouth/Throat: Oropharynx is clear and moist.  Eyes: Conjunctivae and EOM are normal. Pupils are equal, round, and reactive to light.  Neck: Normal range of motion. Neck supple.  Cardiovascular: Normal rate, regular rhythm, normal  heart sounds and intact distal pulses.   Pulmonary/Chest: Effort normal and breath sounds normal.  Abdominal: Soft. Bowel sounds are normal.  Musculoskeletal: Normal range of motion.  Neurological: He is alert and oriented to person, place, and time. He has normal reflexes.  Skin: Skin is warm and dry.  Psychiatric: He has a normal mood and affect. His behavior is normal. Thought content normal.    ED Course  Procedures (including critical care time)  Labs Reviewed  GLUCOSE, CAPILLARY - Abnormal; Notable for the following:    Glucose-Capillary 225 (*)    All other components within normal limits   No results found.   No diagnosis found.  Date: 07/19/2012  Rate: 79  Rhythm: normal sinus rhythm  QRS Axis: normal  Intervals: normal  ST/T Wave abnormalities: normal  Conduction Disutrbances:none  Narrative Interpretation:   Old EKG Reviewed: none available     MDM  Last d/c summary History of present illness:  57 year old male h/o DM, HTN, Etoh, PUD, "early cirrhosis", ?CVA p/w AMS. Patient and girlfriend report slowing of actions and though process over past 3-4 days. Feel like he is occasionally leaning to right side. Has had similar symptoms previously. Hospitalized for 2 days last month. At that time, believed AMS 2/2 liver problems. Patient states he has been taking his lactulose with normal BM's. Pt girlfriend reports pt very lethargic, drowsy, sleeps all day, frequent falls x 3-4 days. Pt c/o dizziness especially upon standing. Stated, "my equilibrium is off". Girlfriend states noticed abd increasing in size (even though pt has not noticed), has been taking meds as prescribed, BLE edema is normal for pt. Pt denies SOB, CP, n/v/d or pain. States has a good appetite. Concern for likely hepatic encephalopathy. Patient states stroke in past. CT head negative. Neuro exam largely intact except as above. CVA unlikely. Patient with elevated ammonia (150)  Hospital Course:  Altered  Mental Status  - Likely has a combination of hepatic encephalopathy and polypharmacy.  Mr. Daywalt was started on lactulose and rifaximin therapy. His urinalysis did not show signs of infection, his chest xray and head CT was negative. This morning during rounds, patient is completely awake and alert. He is ambulating without any difficulty. Per the patient's significant other [name]) the patient has  not had an alcoholic beverage in 3 months. He does take benzodiazepines for bipolar disorder and percocet for chronic pain. Per her history it sounds as though he has been becoming encephalopathic intermittently for the past two months. [name] was educated about using increased doses of lactulose as necessary to clear early encephalopathy.      Today patient presents with very similar symptoms to the last discharge summary which is pasted above. There is no definitive cause of his weakness and altered mental status however likely polypharmacy component. Patient is arousable but somnolent and has had multiple falls. Hospitalist service is being consulted for readmission.       Hilario Quarry, MD 07/19/12 1911  Patient with improved mental status. I discussed the patient with Dr. Onalee Hua. He was placed on observation overnight.  CRITICAL CARE Performed by: Hilario Quarry   Total critical care time: 45  Critical care time was exclusive of separately billable procedures and treating other patients.  Critical care was necessary to treat or prevent imminent or life-threatening deterioration.  Critical care was time spent personally by me on the following activities: development of treatment plan with patient and/or surrogate as well as nursing, discussions with consultants, evaluation of patient's response to treatment, examination of patient, obtaining history from patient or surrogate, ordering and performing treatments and interventions, ordering and review of laboratory studies,  ordering and review of radiographic studies, pulse oximetry and re-evaluation of patient's condition.   Hilario Quarry, MD 07/19/12 [phone]

## 2012-07-20 DIAGNOSIS — D696 Thrombocytopenia, unspecified: Secondary | ICD-10-CM

## 2012-07-20 LAB — CBC
HCT: 29.8 % — ABNORMAL LOW (ref 39.0–52.0)
MCH: 27.2 pg (ref 26.0–34.0)
MCHC: 33.2 g/dL (ref 30.0–36.0)
MCV: 81.9 fL (ref 78.0–100.0)
Platelets: 39 10*3/uL — ABNORMAL LOW (ref 150–400)
RDW: 16.8 % — ABNORMAL HIGH (ref 11.5–15.5)
WBC: 2.1 10*3/uL — ABNORMAL LOW (ref 4.0–10.5)

## 2012-07-20 LAB — BASIC METABOLIC PANEL
BUN: 11 mg/dL (ref 6–23)
CO2: 24 mEq/L (ref 19–32)
Calcium: 8.4 mg/dL (ref 8.4–10.5)
Chloride: 103 mEq/L (ref 96–112)
Creatinine, Ser: 0.75 mg/dL (ref 0.50–1.35)
Glucose, Bld: 258 mg/dL — ABNORMAL HIGH (ref 70–99)

## 2012-07-20 MED ORDER — ALPRAZOLAM 0.5 MG PO TABS: 0.5000 mg | ORAL_TABLET | Freq: Two times a day (BID) | ORAL | Status: DC

## 2012-07-20 MED ORDER — CITALOPRAM HYDROBROMIDE 10 MG PO TABS: 10.0000 mg | ORAL_TABLET | Freq: Every day | ORAL | Status: DC

## 2012-07-20 NOTE — Discharge Summary (Signed)
Physician Discharge Summary  [name] ZOX:[id number] DOB: 08/31/1955 DOA: 07/19/2012  PCP: Lonia Blood, MD  Admit date: 07/19/2012 Discharge date: 07/20/2012  Time spent: 35 minutes  Recommendations for Outpatient Follow-up:  1. Consider referring patient to a pain clinic 2. Consider referring to Hepatology clinic Dr. Julieta Gutting [phone]  Discharge Diagnoses:   Somnolence - probable polypharmacy - resolved    Diabetes mellitus type 2 with complications   Weakness   Cirrhosis   Cocaine abuse   Discharge Condition: good  Diet recommendation: carb modified   Filed Weights   07/19/12 2102  Weight: 100.3 kg (221 lb 1.9 oz)    History of present illness:  57 yo male h/o etoh cirrhosis, cocaine abuse, iddm poorly controlled, htn comes in with 48 hours of over sleepiness. Wife is with him. He is back to his normal state right now but has had poor po intake for several days. His glucose has been uncontrolled. He denies any changes in his chronic pain, no sob, no n/v/d. No bleeding no cough no dysuria. He is compliant with his meds. Last discharge was instructed to dec his xanax to 0.5mg  bid (half of prev dose) and dec his percocet 5/325 to bid from tid which he is doing. Today he had his f/u appt with dr [name] and was very somunlent and instructed to come to ED for evaluation. He had PT eval set up for him at home which was done and was deemed not to need any home physical therapy and [facility]. His pain has been well controlled with the decrease dosing that occurred from last hospitalization (less than 2 weeks ago). No syncopal episodes.    Hospital Course:  1. AMS - resolved during observation period - LFTs, ammonia at baseline. Suspect related to polypharmacy. Patient advised to keep good track of the amount of anxiolytics and opiates he is taking on a prn basis.  2. Cirrhosis - presumed EToh only - check hepatitis panel as well 3. Chronic anxiety - again advised to wean  off benzodiazepines slowly. Started on celexa 10 mg daily   Procedures: None   Consultations:  None   Discharge Exam: Filed Vitals:   07/19/12 1900 07/19/12 2102 07/20/12 0144 07/20/12 0552  BP:  142/61 117/52 118/68  Pulse: 94 80 76 71  Temp:  97.8 F (36.6 C) 97.6 F (36.4 C) 97.6 F (36.4 C)  TempSrc:  Oral Oral Oral  Resp:  18 20 20   Height:  5\' 11"  (1.803 m)    Weight:  100.3 kg (221 lb 1.9 oz)    SpO2: 100% 100% 98% 100%    General: axox3 Cardiovascular: rrr Respiratory: ctab  Discharge Instructions  Discharge Orders   Future Orders Complete By Expires     Diet - low sodium heart healthy  As directed     Increase activity slowly  As directed         Medication List    TAKE these medications       ALPRAZolam 0.5 MG tablet  Commonly known as:  XANAX  Take 1 tablet (0.5 mg total) by mouth every 12 (twelve) hours. For anxiety     cholecalciferol 1000 UNITS tablet  Commonly known as:  VITAMIN D  Take 1,000 Units by mouth at bedtime.     citalopram 10 MG tablet  Commonly known as:  CELEXA  Take 1 tablet (10 mg total) by mouth daily.     folic acid 1 MG tablet  Commonly  known as:  FOLVITE  Take 1 tablet (1 mg total) by mouth daily.     insulin glargine 100 UNIT/ML injection  Commonly known as:  LANTUS  Inject 35 Units into the skin at bedtime.     lactulose 10 GM/15ML solution  Commonly known as:  CHRONULAC  Take 30 mLs (20 g total) by mouth 2 (two) times daily. Dose 2 to 4 times daily - titrate the dose to achieve 2 to 4 soft bowel movements daily.     lisinopril 20 MG tablet  Commonly known as:  PRINIVIL,ZESTRIL  Take 10 mg by mouth daily.     multivitamin with minerals Tabs  Take 1 tablet by mouth daily.     oxyCODONE-acetaminophen 5-325 MG per tablet  Commonly known as:  PERCOCET/ROXICET  Take 1 tablet by mouth every 12 (twelve) hours. For pain     pantoprazole 40 MG tablet  Commonly known as:  PROTONIX  Take 40 mg by mouth 2 (two)  times daily.     rifaximin 550 MG Tabs  Commonly known as:  XIFAXAN  Take 1 tablet (550 mg total) by mouth 2 (two) times daily.     thiamine 100 MG tablet  Take 1 tablet (100 mg total) by mouth daily.           Follow-up Information   Schedule an appointment as soon as possible for a visit with Lonia Blood, MD.   Contact information:   [address] [place] [address] [phone]        The results of significant diagnostics from this hospitalization (including imaging, microbiology, ancillary and laboratory) are listed below for reference.    Significant Diagnostic Studies: Ct Head Wo Contrast  07/19/2012  *RADIOLOGY REPORT*  Clinical Data: Status post fall.  CT HEAD WITHOUT CONTRAST  Technique:  Contiguous axial images were obtained from the base of the skull through the vertex without contrast.  Comparison: 06/15/2012  Findings: The brain has a normal appearance without evidence for hemorrhage, infarction, hydrocephalus, or mass lesion.  There is no extra axial fluid collection.  The skull and paranasal sinuses are normal.  IMPRESSION: No acute intracranial abnormalities noted.   Original Report Authenticated By: Signa Kell, M.D.     Microbiology: No results found for this or any previous visit (from the past 240 hour(s)).   Labs: Basic Metabolic Panel:  Recent Labs Lab 07/19/12 1725 07/20/12 0600  NA 132* 135  K 4.4 3.5  CL 99 103  CO2 25 24  GLUCOSE 261* 258*  BUN 12 11  CREATININE 0.76 0.75  CALCIUM 8.9 8.4   Liver Function Tests:  Recent Labs Lab 07/19/12 1725  AST 39*  ALT 22  ALKPHOS 91  BILITOT 1.9*  PROT 7.1  ALBUMIN 3.0*   No results found for this basename: LIPASE, AMYLASE,  in the last 168 hours  Recent Labs Lab 07/19/12 1725  AMMONIA 28   CBC:  Recent Labs Lab 07/19/12 1725 07/20/12 0600  WBC 2.1* 2.1*  NEUTROABS 1.1*  --   HGB 11.2* 9.9*  HCT 33.3* 29.8*  MCV 81.0 81.9  PLT 39* 39*   Cardiac Enzymes: No  results found for this basename: CKTOTAL, CKMB, CKMBINDEX, TROPONINI,  in the last 168 hours BNP: BNP (last 3 results) No results found for this basename: PROBNP,  in the last 8760 hours CBG:  Recent Labs Lab 07/19/12 1657 07/20/12 0643  GLUCAP 225* 222*       Signed:  Nicci Vaughan  [facility]  07/20/2012, 10:36 AM

## 2012-07-20 NOTE — Progress Notes (Signed)
Pt states doctor told him he could go home.  No complaints voiced, but patient is still moderately confused.  Up in room.

## 2012-07-23 ENCOUNTER — Telehealth: Payer: Self-pay | Admitting: Internal Medicine

## 2012-07-23 LAB — GLUCOSE, CAPILLARY: Glucose-Capillary: 192 mg/dL — ABNORMAL HIGH (ref 70–99)

## 2012-07-23 LAB — HEPATITIS PANEL, ACUTE
Hep A IgM: NEGATIVE
Hep B C IgM: NEGATIVE

## 2012-08-19 ENCOUNTER — Telehealth: Payer: Self-pay | Admitting: Internal Medicine

## 2012-09-15 ENCOUNTER — Emergency Department (HOSPITAL_COMMUNITY)
Admission: EM | Admit: 2012-09-15 | Discharge: 2012-09-15 | Disposition: A | Discharge status: Home / Self Care | Payer: Medicare PPO | Attending: Emergency Medicine | Admitting: Emergency Medicine

## 2012-09-15 ENCOUNTER — Encounter (HOSPITAL_COMMUNITY): Payer: Self-pay

## 2012-09-15 DIAGNOSIS — F411 Generalized anxiety disorder: Secondary | ICD-10-CM | POA: Insufficient documentation

## 2012-09-15 DIAGNOSIS — Z9181 History of falling: Secondary | ICD-10-CM | POA: Insufficient documentation

## 2012-09-15 DIAGNOSIS — R609 Edema, unspecified: Secondary | ICD-10-CM | POA: Insufficient documentation

## 2012-09-15 DIAGNOSIS — E1142 Type 2 diabetes mellitus with diabetic polyneuropathy: Secondary | ICD-10-CM | POA: Insufficient documentation

## 2012-09-15 DIAGNOSIS — F3289 Other specified depressive episodes: Secondary | ICD-10-CM | POA: Insufficient documentation

## 2012-09-15 DIAGNOSIS — K7460 Unspecified cirrhosis of liver: Secondary | ICD-10-CM | POA: Insufficient documentation

## 2012-09-15 DIAGNOSIS — F1010 Alcohol abuse, uncomplicated: Secondary | ICD-10-CM | POA: Insufficient documentation

## 2012-09-15 DIAGNOSIS — F329 Major depressive disorder, single episode, unspecified: Secondary | ICD-10-CM | POA: Insufficient documentation

## 2012-09-15 DIAGNOSIS — Z79899 Other long term (current) drug therapy: Secondary | ICD-10-CM | POA: Insufficient documentation

## 2012-09-15 DIAGNOSIS — Z8673 Personal history of transient ischemic attack (TIA), and cerebral infarction without residual deficits: Secondary | ICD-10-CM | POA: Insufficient documentation

## 2012-09-15 DIAGNOSIS — Z87891 Personal history of nicotine dependence: Secondary | ICD-10-CM | POA: Insufficient documentation

## 2012-09-15 DIAGNOSIS — E7220 Disorder of urea cycle metabolism, unspecified: Secondary | ICD-10-CM | POA: Insufficient documentation

## 2012-09-15 DIAGNOSIS — I10 Essential (primary) hypertension: Secondary | ICD-10-CM | POA: Insufficient documentation

## 2012-09-15 DIAGNOSIS — W19XXXA Unspecified fall, initial encounter: Secondary | ICD-10-CM

## 2012-09-15 DIAGNOSIS — Z794 Long term (current) use of insulin: Secondary | ICD-10-CM | POA: Insufficient documentation

## 2012-09-15 DIAGNOSIS — Z8739 Personal history of other diseases of the musculoskeletal system and connective tissue: Secondary | ICD-10-CM | POA: Insufficient documentation

## 2012-09-15 DIAGNOSIS — R739 Hyperglycemia, unspecified: Secondary | ICD-10-CM

## 2012-09-15 DIAGNOSIS — E1149 Type 2 diabetes mellitus with other diabetic neurological complication: Secondary | ICD-10-CM | POA: Insufficient documentation

## 2012-09-15 DIAGNOSIS — G8929 Other chronic pain: Secondary | ICD-10-CM | POA: Insufficient documentation

## 2012-09-15 HISTORY — DX: Alcohol abuse, uncomplicated: F10.10

## 2012-09-15 LAB — COMPREHENSIVE METABOLIC PANEL
AST: 45 U/L — ABNORMAL HIGH (ref 0–37)
CO2: 25 mEq/L (ref 19–32)
Calcium: 9.4 mg/dL (ref 8.4–10.5)
Creatinine, Ser: 0.7 mg/dL (ref 0.50–1.35)
GFR calc Af Amer: >90 mL/min (ref >90)
GFR calc non Af Amer: >90 mL/min (ref >90)

## 2012-09-15 LAB — URINALYSIS, ROUTINE W REFLEX MICROSCOPIC
Glucose, UA: >1000 mg/dL — AB
Leukocytes, UA: NEGATIVE
Nitrite: NEGATIVE
Protein, ur: NEGATIVE mg/dL
Urobilinogen, UA: 0.2 mg/dL (ref 0.0–1.0)

## 2012-09-15 LAB — CBC WITH DIFFERENTIAL/PLATELET
Basophils Absolute: 0 10*3/uL (ref 0.0–0.1)
Basophils Relative: 0 % (ref 0–1)
Eosinophils Relative: 7 % — ABNORMAL HIGH (ref 0–5)
Hemoglobin: 11.8 g/dL — ABNORMAL LOW (ref 13.0–17.0)
Lymphocytes Relative: 34 % (ref 12–46)
MCH: 28.6 pg (ref 26.0–34.0)
MCHC: 35.4 g/dL (ref 30.0–36.0)
MCV: 80.8 fL (ref 78.0–100.0)
Monocytes Absolute: 0.3 10*3/uL (ref 0.1–1.0)
RBC: 4.12 MIL/uL — ABNORMAL LOW (ref 4.22–5.81)
RDW: 17.9 % — ABNORMAL HIGH (ref 11.5–15.5)
WBC: 2.4 10*3/uL — ABNORMAL LOW (ref 4.0–10.5)

## 2012-09-15 LAB — URINE MICROSCOPIC-ADD ON

## 2012-09-15 MED ORDER — SODIUM CHLORIDE 0.9 % IV BOLUS (SEPSIS)
1000.0000 mL | Freq: Once | INTRAVENOUS | Status: AC
  Administered 2012-09-15: 1000 mL via INTRAVENOUS
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
## 2012-09-15 NOTE — ED Notes (Signed)
Dr. Sheldon at the bedside.  

## 2012-09-15 NOTE — ED Notes (Signed)
Pt states he fell yesterday when his legs gave out on him. Also reports times where he is off balance when walking. C/o bilateral shoulder pain from the fall. Unable to raise arms above shoulder height without pain.

## 2012-09-15 NOTE — ED Notes (Signed)
Phlebotomy at the bedside  

## 2012-10-06 ENCOUNTER — Emergency Department (HOSPITAL_COMMUNITY): Payer: Medicare PPO

## 2012-10-06 ENCOUNTER — Emergency Department (HOSPITAL_COMMUNITY)
Admission: EM | Admit: 2012-10-06 | Discharge: 2012-10-06 | Disposition: A | Discharge status: Home / Self Care | Payer: Medicare PPO | Attending: Emergency Medicine | Admitting: Emergency Medicine

## 2012-10-06 ENCOUNTER — Encounter (HOSPITAL_COMMUNITY): Payer: Self-pay | Admitting: Emergency Medicine

## 2012-10-06 DIAGNOSIS — F3130 Bipolar disorder, current episode depressed, mild or moderate severity, unspecified: Secondary | ICD-10-CM | POA: Insufficient documentation

## 2012-10-06 DIAGNOSIS — Z79899 Other long term (current) drug therapy: Secondary | ICD-10-CM | POA: Insufficient documentation

## 2012-10-06 DIAGNOSIS — Z8739 Personal history of other diseases of the musculoskeletal system and connective tissue: Secondary | ICD-10-CM | POA: Insufficient documentation

## 2012-10-06 DIAGNOSIS — F1011 Alcohol abuse, in remission: Secondary | ICD-10-CM

## 2012-10-06 DIAGNOSIS — E7220 Disorder of urea cycle metabolism, unspecified: Secondary | ICD-10-CM

## 2012-10-06 DIAGNOSIS — I10 Essential (primary) hypertension: Secondary | ICD-10-CM | POA: Insufficient documentation

## 2012-10-06 DIAGNOSIS — Z87891 Personal history of nicotine dependence: Secondary | ICD-10-CM | POA: Insufficient documentation

## 2012-10-06 DIAGNOSIS — K7290 Hepatic failure, unspecified without coma: Secondary | ICD-10-CM

## 2012-10-06 DIAGNOSIS — E119 Type 2 diabetes mellitus without complications: Secondary | ICD-10-CM | POA: Insufficient documentation

## 2012-10-06 DIAGNOSIS — K7460 Unspecified cirrhosis of liver: Secondary | ICD-10-CM

## 2012-10-06 DIAGNOSIS — K7682 Hepatic encephalopathy: Secondary | ICD-10-CM

## 2012-10-06 DIAGNOSIS — Z794 Long term (current) use of insulin: Secondary | ICD-10-CM | POA: Insufficient documentation

## 2012-10-06 DIAGNOSIS — E1165 Type 2 diabetes mellitus with hyperglycemia: Secondary | ICD-10-CM

## 2012-10-06 LAB — URINE MICROSCOPIC-ADD ON

## 2012-10-06 LAB — URINALYSIS, ROUTINE W REFLEX MICROSCOPIC
Bilirubin Urine: NEGATIVE
Hgb urine dipstick: NEGATIVE
Ketones, ur: NEGATIVE mg/dL
Nitrite: NEGATIVE
Specific Gravity, Urine: 1.035 — ABNORMAL HIGH (ref 1.005–1.030)
pH: 5.5 (ref 5.0–8.0)

## 2012-10-06 LAB — COMPREHENSIVE METABOLIC PANEL
ALT: 27 U/L (ref 0–53)
AST: 44 U/L — ABNORMAL HIGH (ref 0–37)
Albumin: 3 g/dL — ABNORMAL LOW (ref 3.5–5.2)
Alkaline Phosphatase: 92 U/L (ref 39–117)
CO2: 25 mEq/L (ref 19–32)
Chloride: 100 mEq/L (ref 96–112)
GFR calc non Af Amer: >90 mL/min (ref >90)
Potassium: 4 mEq/L (ref 3.5–5.1)
Sodium: 135 mEq/L (ref 135–145)
Total Bilirubin: 1.5 mg/dL — ABNORMAL HIGH (ref 0.3–1.2)

## 2012-10-06 LAB — CBC WITH DIFFERENTIAL/PLATELET
Eosinophils Absolute: 0.1 10*3/uL (ref 0.0–0.7)
Eosinophils Relative: 5 % (ref 0–5)
Lymphs Abs: 0.9 10*3/uL (ref 0.7–4.0)
MCH: 28.8 pg (ref 26.0–34.0)
MCHC: 34.2 g/dL (ref 30.0–36.0)
MCV: 84 fL (ref 78.0–100.0)
Monocytes Absolute: 0.3 10*3/uL (ref 0.1–1.0)
Neutrophils Relative %: 41 % — ABNORMAL LOW (ref 43–77)
Platelets: 47 10*3/uL — ABNORMAL LOW (ref 150–400)
RBC: 3.93 MIL/uL — ABNORMAL LOW (ref 4.22–5.81)

## 2012-10-06 LAB — AMMONIA: Ammonia: 63 umol/L — ABNORMAL HIGH (ref 11–60)

## 2012-10-06 MED ORDER — METOCLOPRAMIDE HCL 5 MG/ML IJ SOLN: 10.0000 mg | Freq: Once | INTRAMUSCULAR | Status: DC

## 2012-10-06 MED ORDER — SODIUM CHLORIDE 0.9 % IV BOLUS (SEPSIS)
500.0000 mL | Freq: Once | INTRAVENOUS | Status: AC
  Administered 2012-10-06: 500 mL via INTRAVENOUS

## 2012-10-06 NOTE — ED Notes (Signed)
Pt has a urinal and knows we need a urine sample 

## 2012-10-06 NOTE — ED Notes (Signed)
Pt repeatedly asked to drink water. Pt notified that he can not have water until scans are completed.

## 2012-10-06 NOTE — ED Notes (Signed)
Jen P, PA at bedside  
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
## 2012-10-06 NOTE — ED Notes (Signed)
Pt alert and mentating appropriately upon d/c. Pt given d/c teaching and follow up care instructions. Pt has no further questions upon d/c and verbalizes understanding. Pt ambulatory upon d/c. Pt leaving with d/c teaching. NAD noted upon d/c.
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
## 2012-10-07 NOTE — ED Provider Notes (Signed)
Medical screening examination/treatment/procedure(s) were performed by non-physician practitioner and as supervising physician I was immediately available for consultation/collaboration.   Christopher J. Pollina, MD 10/07/12 0717 

## 2012-10-10 ENCOUNTER — Emergency Department (HOSPITAL_COMMUNITY)
Admission: EM | Admit: 2012-10-10 | Discharge: 2012-10-10 | Disposition: A | Discharge status: Home / Self Care | Payer: Medicare PPO | Attending: Emergency Medicine | Admitting: Emergency Medicine

## 2012-10-10 ENCOUNTER — Encounter (HOSPITAL_COMMUNITY): Payer: Self-pay | Admitting: Family Medicine

## 2012-10-10 DIAGNOSIS — Z8719 Personal history of other diseases of the digestive system: Secondary | ICD-10-CM | POA: Insufficient documentation

## 2012-10-10 DIAGNOSIS — Z8739 Personal history of other diseases of the musculoskeletal system and connective tissue: Secondary | ICD-10-CM | POA: Insufficient documentation

## 2012-10-10 DIAGNOSIS — G8929 Other chronic pain: Secondary | ICD-10-CM | POA: Insufficient documentation

## 2012-10-10 DIAGNOSIS — I10 Essential (primary) hypertension: Secondary | ICD-10-CM | POA: Insufficient documentation

## 2012-10-10 DIAGNOSIS — Z8669 Personal history of other diseases of the nervous system and sense organs: Secondary | ICD-10-CM | POA: Insufficient documentation

## 2012-10-10 DIAGNOSIS — F3130 Bipolar disorder, current episode depressed, mild or moderate severity, unspecified: Secondary | ICD-10-CM | POA: Insufficient documentation

## 2012-10-10 DIAGNOSIS — Z8673 Personal history of transient ischemic attack (TIA), and cerebral infarction without residual deficits: Secondary | ICD-10-CM | POA: Insufficient documentation

## 2012-10-10 DIAGNOSIS — M25519 Pain in unspecified shoulder: Secondary | ICD-10-CM | POA: Insufficient documentation

## 2012-10-10 DIAGNOSIS — Z79899 Other long term (current) drug therapy: Secondary | ICD-10-CM | POA: Insufficient documentation

## 2012-10-10 DIAGNOSIS — Z9889 Other specified postprocedural states: Secondary | ICD-10-CM | POA: Insufficient documentation

## 2012-10-10 DIAGNOSIS — Z794 Long term (current) use of insulin: Secondary | ICD-10-CM | POA: Insufficient documentation

## 2012-10-10 DIAGNOSIS — E119 Type 2 diabetes mellitus without complications: Secondary | ICD-10-CM | POA: Insufficient documentation

## 2012-10-10 DIAGNOSIS — Z87891 Personal history of nicotine dependence: Secondary | ICD-10-CM | POA: Insufficient documentation

## 2012-10-10 MED ORDER — HYDROCODONE-ACETAMINOPHEN 5-325 MG PO TABS: 1.0000 | ORAL_TABLET | ORAL | Status: DC | PRN

## 2012-10-10 NOTE — ED Provider Notes (Signed)
History     CSN: [id number]  Arrival date & time 10/10/12  1052   First MD Initiated Contact with Patient 10/10/12 1100      Chief Complaint  Patient presents with  . Joint Pain    (Consider location/radiation/quality/duration/timing/severity/associated sxs/prior treatment) Patient is a 57 y.o. male presenting with shoulder pain. The history is provided by the patient.  Shoulder Pain Pertinent negatives include no chills, fever or rash. Associated symptoms comments: Generalized pain that is worse today in shoulders, neck and lower extremities. He reports it is his chronic pain without fever, new injury, swelling or fever. .    Past Medical History  Diagnosis Date  . Neuropathy   . Diabetes mellitus   . Bipolar affect, depressed   . Hypertension   . Arthritis   . Stroke     Mini stroke about 81yrs ago  . Cirrhosis   . Alcohol abuse     Past Surgical History  Procedure Laterality Date  . Fracture surgery      Leg and arm 64yrs ago  . Esophagogastroduodenoscopy  04/04/2012    Procedure: ESOPHAGOGASTRODUODENOSCOPY (EGD);  Surgeon: Hilarie Fredrickson, MD;  Location: [facility] ENDOSCOPY;  Service: Endoscopy;  Laterality: N/A;    Family History  Problem Relation Age of Onset  . Hypotension Mother     History  Substance Use Topics  . Smoking status: Former Smoker -- 0.50 packs/day for 30 years    Quit date: 04/06/2012  . Smokeless tobacco: Never Used     Comment: quit   . Alcohol Use: No     Comment: heavy (approx 12/day)............. quit  1 week ago      Review of Systems  Constitutional: Negative for fever and chills.  Respiratory: Negative.   Cardiovascular: Negative.   Gastrointestinal: Negative.   Musculoskeletal:       See HPI.  Skin: Negative.  Negative for rash.  Neurological: Negative.     Allergies  Review of patient's allergies indicates no known allergies.  Home Medications   Current Outpatient Rx  Name  Route  Sig  Dispense  Refill  . ALPRAZolam  (XANAX) 0.5 MG tablet   Oral   Take 1 tablet (0.5 mg total) by mouth every 12 (twelve) hours. For anxiety   60 tablet   0   . cholecalciferol (VITAMIN D) 1000 UNITS tablet   Oral   Take 1,000 Units by mouth at bedtime.          . diclofenac sodium (VOLTAREN) 1 % GEL   Topical   Apply 2 g topically daily.         Marland Kitchen FLUoxetine (PROZAC) 20 MG capsule   Oral   Take 20 mg by mouth daily.         . insulin glargine (LANTUS) 100 UNIT/ML injection   Subcutaneous   Inject 35 Units into the skin at bedtime.   10 mL      . lactulose (CHRONULAC) 10 GM/15ML solution   Oral   Take 30 mLs (20 g total) by mouth 2 (two) times daily. Dose 2 to 4 times daily - titrate the dose to achieve 2 to 4 soft bowel movements daily.   240 mL   1   . lisinopril (PRINIVIL,ZESTRIL) 20 MG tablet   Oral   Take 10 mg by mouth daily.         Marland Kitchen oxyCODONE-acetaminophen (PERCOCET/ROXICET) 5-325 MG per tablet   Oral   Take 1 tablet by mouth every 12 (twelve)  hours. For pain   30 tablet      . pantoprazole (PROTONIX) 40 MG tablet   Oral   Take 40 mg by mouth 2 (two) times daily.         Marland Kitchen pyridOXINE (VITAMIN B-6) 100 MG tablet   Oral   Take 100 mg by mouth daily. Hasn't started yet         . thiamine 100 MG tablet   Oral   Take 1 tablet (100 mg total) by mouth daily.   30 tablet   0     BP 137/83  Pulse 102  Temp(Src) 98.1 F (36.7 C) (Oral)  Resp 16  SpO2 96%  Physical Exam  Constitutional: He is oriented to person, place, and time. He appears well-developed and well-nourished.  HENT:  Head: Normocephalic.  Neck: Normal range of motion. Neck supple.  Pulmonary/Chest: Effort normal.  Abdominal: Soft. Bowel sounds are normal. There is no tenderness. There is no rebound and no guarding.  Musculoskeletal: Normal range of motion.  No joint redness or swelling. FROM all joints.  Neurological: He is alert and oriented to person, place, and time.  Skin: Skin is warm and dry. No  rash noted.  Psychiatric: He has a normal mood and affect.    ED Course  Procedures (including critical care time)  Labs Reviewed - No data to display No results found.   No diagnosis found.  1. Chronic pain   MDM  Patient tearful but declines [facility] evaluation for depression or other issues. Has community follow up for chronic medical concerns.         Arnoldo Hooker, PA-C 10/10/12 1209

## 2012-10-10 NOTE — ED Provider Notes (Signed)
Medical screening examination/treatment/procedure(s) were performed by non-physician practitioner and as supervising physician I was immediately available for consultation/collaboration.  Thomes Burak R. Teauna Dubach, MD 10/10/12 1555 

## 2012-10-10 NOTE — ED Notes (Signed)
Pt to ED per EMS, c/o "hurting all over". States has been here three times in the past month, "I just want someone to give me some hope".

## 2012-10-10 NOTE — ED Notes (Signed)
Per EMS, pt having pain all over. Hx of neuropathy. 6 weeks of intense pain allover and falls.

## 2012-10-11 ENCOUNTER — Emergency Department (HOSPITAL_COMMUNITY)
Admission: EM | Admit: 2012-10-11 | Discharge: 2012-10-11 | Disposition: A | Discharge status: Home / Self Care | Payer: Medicare PPO | Attending: Emergency Medicine | Admitting: Emergency Medicine

## 2012-10-11 ENCOUNTER — Encounter (HOSPITAL_COMMUNITY): Payer: Self-pay | Admitting: *Deleted

## 2012-10-11 DIAGNOSIS — F329 Major depressive disorder, single episode, unspecified: Secondary | ICD-10-CM | POA: Insufficient documentation

## 2012-10-11 DIAGNOSIS — E1149 Type 2 diabetes mellitus with other diabetic neurological complication: Secondary | ICD-10-CM | POA: Insufficient documentation

## 2012-10-11 DIAGNOSIS — Z794 Long term (current) use of insulin: Secondary | ICD-10-CM | POA: Insufficient documentation

## 2012-10-11 DIAGNOSIS — Z8719 Personal history of other diseases of the digestive system: Secondary | ICD-10-CM | POA: Insufficient documentation

## 2012-10-11 DIAGNOSIS — F3289 Other specified depressive episodes: Secondary | ICD-10-CM | POA: Insufficient documentation

## 2012-10-11 DIAGNOSIS — I10 Essential (primary) hypertension: Secondary | ICD-10-CM | POA: Insufficient documentation

## 2012-10-11 DIAGNOSIS — Z8739 Personal history of other diseases of the musculoskeletal system and connective tissue: Secondary | ICD-10-CM | POA: Insufficient documentation

## 2012-10-11 DIAGNOSIS — Z043 Encounter for examination and observation following other accident: Secondary | ICD-10-CM | POA: Insufficient documentation

## 2012-10-11 DIAGNOSIS — E7220 Disorder of urea cycle metabolism, unspecified: Secondary | ICD-10-CM | POA: Insufficient documentation

## 2012-10-11 DIAGNOSIS — Z87891 Personal history of nicotine dependence: Secondary | ICD-10-CM | POA: Insufficient documentation

## 2012-10-11 DIAGNOSIS — Z79899 Other long term (current) drug therapy: Secondary | ICD-10-CM | POA: Insufficient documentation

## 2012-10-11 DIAGNOSIS — F1010 Alcohol abuse, uncomplicated: Secondary | ICD-10-CM | POA: Insufficient documentation

## 2012-10-11 DIAGNOSIS — E1142 Type 2 diabetes mellitus with diabetic polyneuropathy: Secondary | ICD-10-CM | POA: Insufficient documentation

## 2012-10-11 DIAGNOSIS — Z8673 Personal history of transient ischemic attack (TIA), and cerebral infarction without residual deficits: Secondary | ICD-10-CM | POA: Insufficient documentation

## 2012-10-11 DIAGNOSIS — W19XXXA Unspecified fall, initial encounter: Secondary | ICD-10-CM

## 2012-10-11 LAB — COMPREHENSIVE METABOLIC PANEL
Alkaline Phosphatase: 87 U/L (ref 39–117)
BUN: 9 mg/dL (ref 6–23)
Chloride: 99 mEq/L (ref 96–112)
Creatinine, Ser: 0.65 mg/dL (ref 0.50–1.35)
GFR calc Af Amer: >90 mL/min (ref >90)
Glucose, Bld: 286 mg/dL — ABNORMAL HIGH (ref 70–99)
Potassium: 4 mEq/L (ref 3.5–5.1)
Total Bilirubin: 1.5 mg/dL — ABNORMAL HIGH (ref 0.3–1.2)
Total Protein: 6.4 g/dL (ref 6.0–8.3)

## 2012-10-11 LAB — CBC WITH DIFFERENTIAL/PLATELET
Basophils Absolute: 0 10*3/uL (ref 0.0–0.1)
Eosinophils Relative: 6 % — ABNORMAL HIGH (ref 0–5)
Lymphocytes Relative: 39 % (ref 12–46)
Monocytes Relative: 11 % (ref 3–12)
Platelets: 43 10*3/uL — ABNORMAL LOW (ref 150–400)
RBC: 3.84 MIL/uL — ABNORMAL LOW (ref 4.22–5.81)
RDW: 18.7 % — ABNORMAL HIGH (ref 11.5–15.5)
WBC: 2.2 10*3/uL — ABNORMAL LOW (ref 4.0–10.5)

## 2012-10-11 LAB — URINALYSIS, ROUTINE W REFLEX MICROSCOPIC
Leukocytes, UA: NEGATIVE
Nitrite: NEGATIVE
Protein, ur: NEGATIVE mg/dL
Urobilinogen, UA: 1 mg/dL (ref 0.0–1.0)

## 2012-10-11 LAB — URINE MICROSCOPIC-ADD ON

## 2012-10-11 LAB — AMMONIA: Ammonia: 112 umol/L — ABNORMAL HIGH (ref 11–60)

## 2012-10-11 NOTE — ED Notes (Signed)
Pt was able to ambulate with no issues

## 2012-10-11 NOTE — ED Provider Notes (Signed)
History     CSN: [id number]  Arrival date & time 10/11/12  1406   First MD Initiated Contact with Patient 10/11/12 1516      Chief Complaint  Patient presents with  . Fall     Patient is a 57 y.o. male presenting with fall. The history is provided by the patient.  Fall This is a recurrent problem. Episode onset: earlier today. The problem has been rapidly improving. Pertinent negatives include no chest pain, no abdominal pain, no headaches and no shortness of breath. Nothing aggravates the symptoms. Nothing relieves the symptoms.  pt reports he fell earlier today while trying to get his dogs outside.  No LOC.  No dizziness but he reports he "lost my balance" No head injury He denies neck or back pain No focal weakness No abdominal pain.  No chest pain He reports h/o frequent falls   Past Medical History  Diagnosis Date  . Neuropathy   . Diabetes mellitus   . Bipolar affect, depressed   . Hypertension   . Arthritis   . Stroke     Mini stroke about 60yrs ago  . Cirrhosis   . Alcohol abuse     Past Surgical History  Procedure Laterality Date  . Fracture surgery      Leg and arm 20yrs ago  . Esophagogastroduodenoscopy  04/04/2012    Procedure: ESOPHAGOGASTRODUODENOSCOPY (EGD);  Surgeon: Hilarie Fredrickson, MD;  Location: [facility] ENDOSCOPY;  Service: Endoscopy;  Laterality: N/A;    Family History  Problem Relation Age of Onset  . Hypotension Mother     History  Substance Use Topics  . Smoking status: Former Smoker -- 0.50 packs/day for 30 years    Quit date: 04/06/2012  . Smokeless tobacco: Never Used     Comment: quit   . Alcohol Use: No     Comment: heavy (approx 12/day)............. quit  1 week ago      Review of Systems  Constitutional: Negative for fever.  HENT: Negative for neck pain.   Respiratory: Negative for shortness of breath.   Cardiovascular: Negative for chest pain.  Gastrointestinal: Negative for abdominal pain.  Musculoskeletal: Negative for back  pain.  Neurological: Negative for headaches.  All other systems reviewed and are negative.    Allergies  Review of patient's allergies indicates no known allergies.  Home Medications   Current Outpatient Rx  Name  Route  Sig  Dispense  Refill  . ALPRAZolam (XANAX) 0.5 MG tablet   Oral   Take 1 tablet (0.5 mg total) by mouth every 12 (twelve) hours. For anxiety   60 tablet   0   . cholecalciferol (VITAMIN D) 1000 UNITS tablet   Oral   Take 1,000 Units by mouth at bedtime.          . diclofenac sodium (VOLTAREN) 1 % GEL   Topical   Apply 2 g topically daily.         Marland Kitchen FLUoxetine (PROZAC) 20 MG capsule   Oral   Take 20 mg by mouth daily.         Marland Kitchen HYDROcodone-acetaminophen (NORCO/VICODIN) 5-325 MG per tablet   Oral   Take 1-2 tablets by mouth every 4 (four) hours as needed for pain.   12 tablet   0   . insulin glargine (LANTUS) 100 UNIT/ML injection   Subcutaneous   Inject 35 Units into the skin at bedtime.   10 mL      . lisinopril (PRINIVIL,ZESTRIL) 20 MG tablet  Oral   Take 10 mg by mouth daily.         . pantoprazole (PROTONIX) 40 MG tablet   Oral   Take 40 mg by mouth 2 (two) times daily.         Marland Kitchen pyridOXINE (VITAMIN B-6) 100 MG tablet   Oral   Take 100 mg by mouth daily. Hasn't started yet         . thiamine 100 MG tablet   Oral   Take 1 tablet (100 mg total) by mouth daily.   30 tablet   0     BP 127/57  Pulse 76  Temp(Src) 98.3 F (36.8 C) (Oral)  Resp 16  SpO2 97%  Physical Exam CONSTITUTIONAL: Well developed/well nourished HEAD: Normocephalic/atraumatic EYES: EOMI/PERRL ENMT: Mucous membranes moist NECK: supple no meningeal signs SPINE:entire spine nontender, NEXUS criteria met, No bruising/crepitance/stepoffs noted to spine CV: S1/S2 noted, no murmurs/rubs/gallops noted LUNGS: Lungs are clear to auscultation bilaterally, no apparent distress ABDOMEN: soft, nontender, no rebound or guarding GU:no cva  tenderness NEURO: Pt is awake/alert, moves all extremitiesx4 EXTREMITIES: pulses normal, full ROM, no deformity, no tenderness, All other extremities/joints palpated/ranged and nontender SKIN: warm, color normal PSYCH: no abnormalities of mood noted  ED Course  Procedures   Labs Reviewed  COMPREHENSIVE METABOLIC PANEL  CBC WITH DIFFERENTIAL  URINALYSIS, ROUTINE W REFLEX MICROSCOPIC  AMMONIA   4:04 PM On my eval, pt denies any neck pain or back pain.  NEXUS criteria met.  No signs of head trauma.  He is awake/alert but reports feeling unsteady on his feet.  EKG/labs ordered.  Will follow closely 6:31 PM Pt ambulatory , no distress, no focal weakness, no signs of traumatic injury His ammonia is elevated but he is not encephalopathic and he reports he takes lactulose at home Stable for d/c home Doubt vertigo or cardiac cause of his falls  MDM  Nursing notes including past medical history and social history reviewed and considered in documentation Previous records reviewed and considered Labs/vital reviewed and considered        Date: 10/11/2012  Rate: 65  Rhythm: normal sinus rhythm  QRS Axis: normal  Intervals: normal  ST/T Wave abnormalities: nonspecific ST changes  Conduction Disutrbances:none  Narrative Interpretation:   Old EKG Reviewed: unchanged    Joya Gaskins, MD 10/11/12 [phone]

## 2012-10-11 NOTE — ED Notes (Signed)
Pt reports that he is here 'for the same thing he always is'.  States that he fell trying to get his dogs outside.  Pt hit his head above his (R) eye.  Denies LOC.  Pt reports soreness in his entire body-including neck.  C-collar applied in triage. Pt A/O x 4, ambulatory.

## 2012-11-05 ENCOUNTER — Encounter (HOSPITAL_COMMUNITY): Payer: Self-pay

## 2012-11-05 ENCOUNTER — Emergency Department (HOSPITAL_COMMUNITY)
Admission: EM | Admit: 2012-11-05 | Discharge: 2012-11-05 | Discharge status: Against Medical Advice | Payer: Medicare PPO | Attending: Emergency Medicine | Admitting: Emergency Medicine

## 2012-11-05 DIAGNOSIS — F3289 Other specified depressive episodes: Secondary | ICD-10-CM | POA: Insufficient documentation

## 2012-11-05 DIAGNOSIS — F329 Major depressive disorder, single episode, unspecified: Secondary | ICD-10-CM | POA: Insufficient documentation

## 2012-11-05 LAB — CBC WITH DIFFERENTIAL/PLATELET
Basophils Relative: 0 % (ref 0–1)
Eosinophils Absolute: 0.2 10*3/uL (ref 0.0–0.7)
HCT: 36.6 % — ABNORMAL LOW (ref 39.0–52.0)
Hemoglobin: 12.6 g/dL — ABNORMAL LOW (ref 13.0–17.0)
Lymphs Abs: 1.1 10*3/uL (ref 0.7–4.0)
MCH: 29.4 pg (ref 26.0–34.0)
MCHC: 34.4 g/dL (ref 30.0–36.0)
MCV: 85.5 fL (ref 78.0–100.0)
Monocytes Absolute: 0.4 10*3/uL (ref 0.1–1.0)
Neutro Abs: 1.5 10*3/uL — ABNORMAL LOW (ref 1.7–7.7)

## 2012-11-05 LAB — ETHANOL: Alcohol, Ethyl (B): 70 mg/dL — ABNORMAL HIGH (ref 0–11)

## 2012-11-05 LAB — COMPREHENSIVE METABOLIC PANEL
BUN: 5 mg/dL — ABNORMAL LOW (ref 6–23)
Calcium: 9 mg/dL (ref 8.4–10.5)
GFR calc Af Amer: >90 mL/min (ref >90)
GFR calc non Af Amer: >90 mL/min (ref >90)
Glucose, Bld: 244 mg/dL — ABNORMAL HIGH (ref 70–99)
Total Protein: 7.1 g/dL (ref 6.0–8.3)
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
## 2012-11-05 NOTE — ED Notes (Signed)
Patient reports that he ran out of medicitions 3 days ago. Patient states that he is requesting medical clearance. Patient denies SI/HI. Patient states he last drank 6 cans beer at 0800.

## 2012-11-09 ENCOUNTER — Emergency Department (EMERGENCY_DEPARTMENT_HOSPITAL)
Admission: EM | Admit: 2012-11-09 | Discharge: 2012-11-10 | Disposition: A | Discharge status: Home / Self Care | Payer: Medicare PPO | Source: Home / Self Care | Attending: Emergency Medicine | Admitting: Emergency Medicine

## 2012-11-09 ENCOUNTER — Emergency Department (HOSPITAL_COMMUNITY)
Admission: EM | Admit: 2012-11-09 | Discharge: 2012-11-09 | Disposition: A | Discharge status: Home / Self Care | Payer: Medicare PPO | Attending: Emergency Medicine | Admitting: Emergency Medicine

## 2012-11-09 ENCOUNTER — Encounter (HOSPITAL_COMMUNITY): Payer: Self-pay | Admitting: Adult Health

## 2012-11-09 ENCOUNTER — Encounter (HOSPITAL_COMMUNITY): Payer: Self-pay | Admitting: Emergency Medicine

## 2012-11-09 DIAGNOSIS — R279 Unspecified lack of coordination: Secondary | ICD-10-CM | POA: Insufficient documentation

## 2012-11-09 DIAGNOSIS — F3289 Other specified depressive episodes: Secondary | ICD-10-CM | POA: Insufficient documentation

## 2012-11-09 DIAGNOSIS — Z8669 Personal history of other diseases of the nervous system and sense organs: Secondary | ICD-10-CM | POA: Insufficient documentation

## 2012-11-09 DIAGNOSIS — F17200 Nicotine dependence, unspecified, uncomplicated: Secondary | ICD-10-CM | POA: Insufficient documentation

## 2012-11-09 DIAGNOSIS — Z8673 Personal history of transient ischemic attack (TIA), and cerebral infarction without residual deficits: Secondary | ICD-10-CM | POA: Insufficient documentation

## 2012-11-09 DIAGNOSIS — F329 Major depressive disorder, single episode, unspecified: Secondary | ICD-10-CM | POA: Insufficient documentation

## 2012-11-09 DIAGNOSIS — Z79899 Other long term (current) drug therapy: Secondary | ICD-10-CM | POA: Insufficient documentation

## 2012-11-09 DIAGNOSIS — Z8659 Personal history of other mental and behavioral disorders: Secondary | ICD-10-CM | POA: Insufficient documentation

## 2012-11-09 DIAGNOSIS — Z8719 Personal history of other diseases of the digestive system: Secondary | ICD-10-CM | POA: Insufficient documentation

## 2012-11-09 DIAGNOSIS — F1010 Alcohol abuse, uncomplicated: Secondary | ICD-10-CM | POA: Insufficient documentation

## 2012-11-09 DIAGNOSIS — M129 Arthropathy, unspecified: Secondary | ICD-10-CM | POA: Insufficient documentation

## 2012-11-09 DIAGNOSIS — F411 Generalized anxiety disorder: Secondary | ICD-10-CM | POA: Insufficient documentation

## 2012-11-09 DIAGNOSIS — F1410 Cocaine abuse, uncomplicated: Secondary | ICD-10-CM | POA: Insufficient documentation

## 2012-11-09 DIAGNOSIS — R110 Nausea: Secondary | ICD-10-CM | POA: Insufficient documentation

## 2012-11-09 DIAGNOSIS — Z791 Long term (current) use of non-steroidal anti-inflammatories (NSAID): Secondary | ICD-10-CM | POA: Insufficient documentation

## 2012-11-09 DIAGNOSIS — R109 Unspecified abdominal pain: Secondary | ICD-10-CM | POA: Insufficient documentation

## 2012-11-09 DIAGNOSIS — I10 Essential (primary) hypertension: Secondary | ICD-10-CM | POA: Insufficient documentation

## 2012-11-09 DIAGNOSIS — K7460 Unspecified cirrhosis of liver: Secondary | ICD-10-CM | POA: Insufficient documentation

## 2012-11-09 DIAGNOSIS — F1910 Other psychoactive substance abuse, uncomplicated: Secondary | ICD-10-CM

## 2012-11-09 DIAGNOSIS — E119 Type 2 diabetes mellitus without complications: Secondary | ICD-10-CM | POA: Insufficient documentation

## 2012-11-09 DIAGNOSIS — Z9889 Other specified postprocedural states: Secondary | ICD-10-CM | POA: Insufficient documentation

## 2012-11-09 DIAGNOSIS — Z8701 Personal history of pneumonia (recurrent): Secondary | ICD-10-CM | POA: Insufficient documentation

## 2012-11-09 DIAGNOSIS — G4700 Insomnia, unspecified: Secondary | ICD-10-CM | POA: Insufficient documentation

## 2012-11-09 DIAGNOSIS — Z794 Long term (current) use of insulin: Secondary | ICD-10-CM | POA: Insufficient documentation

## 2012-11-09 DIAGNOSIS — M2550 Pain in unspecified joint: Secondary | ICD-10-CM

## 2012-11-09 DIAGNOSIS — F10920 Alcohol use, unspecified with intoxication, uncomplicated: Secondary | ICD-10-CM

## 2012-11-09 DIAGNOSIS — R450 Nervousness: Secondary | ICD-10-CM | POA: Insufficient documentation

## 2012-11-09 DIAGNOSIS — F319 Bipolar disorder, unspecified: Secondary | ICD-10-CM | POA: Insufficient documentation

## 2012-11-09 LAB — COMPREHENSIVE METABOLIC PANEL
ALT: 32 U/L (ref 0–53)
AST: 92 U/L — ABNORMAL HIGH (ref 0–37)
Alkaline Phosphatase: 89 U/L (ref 39–117)
Alkaline Phosphatase: 102 U/L (ref 39–117)
BUN: 7 mg/dL (ref 6–23)
CO2: 21 mEq/L (ref 19–32)
Chloride: 100 mEq/L (ref 96–112)
Creatinine, Ser: 0.69 mg/dL (ref 0.50–1.35)
GFR calc Af Amer: >90 mL/min (ref >90)
GFR calc non Af Amer: >90 mL/min (ref >90)
Glucose, Bld: 167 mg/dL — ABNORMAL HIGH (ref 70–99)
Glucose, Bld: 339 mg/dL — ABNORMAL HIGH (ref 70–99)
Potassium: 4.5 mEq/L (ref 3.5–5.1)
Potassium: 4.3 mEq/L (ref 3.5–5.1)
Sodium: 135 mEq/L (ref 135–145)
Total Bilirubin: 1.3 mg/dL — ABNORMAL HIGH (ref 0.3–1.2)
Total Protein: 6.8 g/dL (ref 6.0–8.3)

## 2012-11-09 LAB — CBC
Hemoglobin: 12 g/dL — ABNORMAL LOW (ref 13.0–17.0)
MCHC: 34.7 g/dL (ref 30.0–36.0)
Platelets: 67 10*3/uL — ABNORMAL LOW (ref 150–400)
RBC: 4.02 MIL/uL — ABNORMAL LOW (ref 4.22–5.81)

## 2012-11-09 LAB — URINALYSIS, ROUTINE W REFLEX MICROSCOPIC
Bilirubin Urine: NEGATIVE
Ketones, ur: NEGATIVE mg/dL
Leukocytes, UA: NEGATIVE
Nitrite: NEGATIVE
Protein, ur: NEGATIVE mg/dL
pH: 5 (ref 5.0–8.0)

## 2012-11-09 LAB — CBC WITH DIFFERENTIAL/PLATELET
Basophils Relative: 1 % (ref 0–1)
Eosinophils Relative: 8 % — ABNORMAL HIGH (ref 0–5)
HCT: 34.4 % — ABNORMAL LOW (ref 39.0–52.0)
Hemoglobin: 11.8 g/dL — ABNORMAL LOW (ref 13.0–17.0)
Lymphocytes Relative: 29 % (ref 12–46)
Lymphs Abs: 0.8 10*3/uL (ref 0.7–4.0)
MCV: 86.2 fL (ref 78.0–100.0)
Monocytes Relative: 10 % (ref 3–12)
Neutro Abs: 1.5 10*3/uL — ABNORMAL LOW (ref 1.7–7.7)
RBC: 3.99 MIL/uL — ABNORMAL LOW (ref 4.22–5.81)
RDW: 18.2 % — ABNORMAL HIGH (ref 11.5–15.5)
WBC: 2.8 10*3/uL — ABNORMAL LOW (ref 4.0–10.5)

## 2012-11-09 LAB — RAPID URINE DRUG SCREEN, HOSP PERFORMED
Amphetamines: NOT DETECTED
Barbiturates: NOT DETECTED
Benzodiazepines: POSITIVE — AB

## 2012-11-09 LAB — ETHANOL: Alcohol, Ethyl (B): 100 mg/dL — ABNORMAL HIGH (ref 0–11)

## 2012-11-09 MED ORDER — ZOLPIDEM TARTRATE 5 MG PO TABS
5.0000 mg | ORAL_TABLET | Freq: Every evening | ORAL | Status: DC | PRN
  Administered 2012-11-09: 5 mg via ORAL
  Filled 2012-11-09: qty 1

## 2012-11-09 MED ORDER — FLUOXETINE HCL 20 MG PO CAPS
20.0000 mg | ORAL_CAPSULE | Freq: Every day | ORAL | Status: DC
  Administered 2012-11-09 – 2012-11-10 (×2): 20 mg via ORAL
  Filled 2012-11-09 (×2): qty 1

## 2012-11-09 MED ORDER — DICLOFENAC SODIUM 1 % TD GEL
2.0000 g | Freq: Every day | TRANSDERMAL | Status: DC
  Administered 2012-11-09 – 2012-11-10 (×2): 2 g via TOPICAL
  Filled 2012-11-09: qty 100

## 2012-11-09 MED ORDER — LORAZEPAM 1 MG PO TABS
2.0000 mg | ORAL_TABLET | Freq: Once | ORAL | Status: AC
  Administered 2012-11-09: 2 mg via ORAL
  Filled 2012-11-09: qty 2

## 2012-11-09 MED ORDER — ADULT MULTIVITAMIN W/MINERALS CH
1.0000 | ORAL_TABLET | Freq: Every day | ORAL | Status: DC
  Administered 2012-11-09 – 2012-11-10 (×2): 1 via ORAL
  Filled 2012-11-09 (×2): qty 1

## 2012-11-09 MED ORDER — ALUM & MAG HYDROXIDE-SIMETH 200-200-20 MG/5ML PO SUSP: 30.0000 mL | ORAL | Status: DC | PRN

## 2012-11-09 MED ORDER — FOLIC ACID 1 MG PO TABS
1.0000 mg | ORAL_TABLET | Freq: Every day | ORAL | Status: DC
  Administered 2012-11-09 – 2012-11-10 (×2): 1 mg via ORAL
  Filled 2012-11-09 (×2): qty 1

## 2012-11-09 MED ORDER — LORAZEPAM 1 MG PO TABS: 1.0000 mg | ORAL_TABLET | Freq: Three times a day (TID) | ORAL | Status: DC | PRN

## 2012-11-09 MED ORDER — VITAMIN D3 25 MCG (1000 UNIT) PO TABS
1000.0000 [IU] | ORAL_TABLET | Freq: Every day | ORAL | Status: DC
  Administered 2012-11-09: 1000 [IU] via ORAL
  Filled 2012-11-09 (×2): qty 1

## 2012-11-09 MED ORDER — INSULIN GLARGINE 100 UNIT/ML ~~LOC~~ SOLN
35.0000 [IU] | Freq: Every day | SUBCUTANEOUS | Status: DC
  Administered 2012-11-09: 35 [IU] via SUBCUTANEOUS
  Filled 2012-11-09 (×2): qty 0.35

## 2012-11-09 MED ORDER — VITAMIN B-6 100 MG PO TABS
100.0000 mg | ORAL_TABLET | Freq: Every day | ORAL | Status: DC
  Administered 2012-11-09 – 2012-11-10 (×2): 100 mg via ORAL
  Filled 2012-11-09 (×2): qty 1

## 2012-11-09 MED ORDER — NICOTINE 21 MG/24HR TD PT24
21.0000 mg | MEDICATED_PATCH | Freq: Every day | TRANSDERMAL | Status: DC
  Filled 2012-11-09: qty 1

## 2012-11-09 MED ORDER — IBUPROFEN 600 MG PO TABS: 600.0000 mg | ORAL_TABLET | Freq: Three times a day (TID) | ORAL | Status: DC | PRN

## 2012-11-09 MED ORDER — ACETAMINOPHEN 325 MG PO TABS
650.0000 mg | ORAL_TABLET | ORAL | Status: DC | PRN
  Administered 2012-11-09: 650 mg via ORAL
  Filled 2012-11-09: qty 2

## 2012-11-09 MED ORDER — VITAMIN B-1 100 MG PO TABS
100.0000 mg | ORAL_TABLET | Freq: Every day | ORAL | Status: DC
  Administered 2012-11-09 – 2012-11-10 (×2): 100 mg via ORAL
  Filled 2012-11-09 (×2): qty 1

## 2012-11-09 MED ORDER — LACTULOSE 10 GM/15ML PO SOLN
30.0000 g | Freq: Once | ORAL | Status: AC
  Administered 2012-11-09: 30 g via ORAL
  Filled 2012-11-09: qty 45

## 2012-11-09 MED ORDER — ONDANSETRON HCL 4 MG PO TABS: 4.0000 mg | ORAL_TABLET | Freq: Three times a day (TID) | ORAL | Status: DC | PRN

## 2012-11-09 MED ORDER — LISINOPRIL 10 MG PO TABS
10.0000 mg | ORAL_TABLET | Freq: Every day | ORAL | Status: DC
  Administered 2012-11-09 – 2012-11-10 (×2): 10 mg via ORAL
  Filled 2012-11-09 (×2): qty 1

## 2012-11-09 MED ORDER — PANTOPRAZOLE SODIUM 40 MG PO TBEC
40.0000 mg | DELAYED_RELEASE_TABLET | Freq: Two times a day (BID) | ORAL | Status: DC
  Administered 2012-11-09 – 2012-11-10 (×2): 40 mg via ORAL
  Filled 2012-11-09 (×2): qty 1

## 2012-11-09 MED ORDER — THIAMINE HCL 100 MG/ML IJ SOLN: 100.0000 mg | Freq: Every day | INTRAMUSCULAR | Status: DC

## 2012-11-09 NOTE — ED Provider Notes (Signed)
History     CSN: [id number]  Arrival date & time 11/09/12  0245   First MD Initiated Contact with Patient 11/09/12 0457      Chief Complaint  Patient presents with  . Abdominal Pain    (Consider location/radiation/quality/duration/timing/severity/associated sxs/prior treatment) HPI Patient is a 57 yo man with a history of alcoholic liver cirrhosis, hepatic encephalopathy, insulin-dependent diabetes, hypertension and bipolar disorder.  He presents from home via EMS. The patient gives a very rambling history and it is really hard for me to tell why he decided to call 911 and request transportation to the emergency department.  He says things like "I want to learn to eat better". "I need to learn to take better care of myself". The patient admits to drinking 3 beers tonight with his brother. But, he insists that he is not intoxicated.  The patient has multiple chronic pain complaints. He says that he has "crippling arthritis". He says he has also had neuropathy since he suffered an episode of pneumonia 3 years ago. The patient notes that he has run out Norco.  The patient says that he has been compliant with lactulose. He says he takes a shot of lactulose every night. Apparently, he takes his medication only once a day. Patient denies any illicit drug use.    Past Medical History  Diagnosis Date  . Neuropathy   . Diabetes mellitus   . Bipolar affect, depressed   . Hypertension   . Arthritis   . Stroke     Mini stroke about 62yrs ago  . Cirrhosis   . Alcohol abuse     Past Surgical History  Procedure Laterality Date  . Fracture surgery      Leg and arm 93yrs ago  . Esophagogastroduodenoscopy  04/04/2012    Procedure: ESOPHAGOGASTRODUODENOSCOPY (EGD);  Surgeon: Hilarie Fredrickson, MD;  Location: [facility] ENDOSCOPY;  Service: Endoscopy;  Laterality: N/A;    Family History  Problem Relation Age of Onset  . Hypotension Mother     History  Substance Use Topics  . Smoking status:  Current Every Day Smoker -- 0.50 packs/day for 30 years    Types: Cigarettes    Last Attempt to Quit: 04/06/2012  . Smokeless tobacco: Never Used     Comment: quit   . Alcohol Use: Not on file     Comment: last drank 6 cans beer at 0800 today      Review of Systems Gen: no weight loss, fevers, chills, night sweats Eyes: no discharge or drainage, no occular pain or visual changes Nose: no epistaxis or rhinorrhea Mouth: no dental pain, no sore throat Neck: no neck pain Lungs: no SOB, cough, wheezing CV: no chest pain, palpitations, dependent edema or orthopnea Abd: no abdominal pain, nausea, vomiting GU: no dysuria or gross hematuria MSK: Diffuse arthralgia Neuro: Peripheral neuropathy, no headache, otherwise focal neurologic deficits Skin: no rash Psyche: negative.  Allergies  Review of patient's allergies indicates no known allergies.  Home Medications   Current Outpatient Rx  Name  Route  Sig  Dispense  Refill  . ALPRAZolam (XANAX) 0.5 MG tablet   Oral   Take 1 tablet (0.5 mg total) by mouth every 12 (twelve) hours. For anxiety   60 tablet   0   . cholecalciferol (VITAMIN D) 1000 UNITS tablet   Oral   Take 1,000 Units by mouth at bedtime.          . diclofenac sodium (VOLTAREN) 1 % GEL   Topical  Apply 2 g topically daily.         Marland Kitchen FLUoxetine (PROZAC) 20 MG capsule   Oral   Take 20 mg by mouth daily.         Marland Kitchen HYDROcodone-acetaminophen (NORCO/VICODIN) 5-325 MG per tablet   Oral   Take 1-2 tablets by mouth every 4 (four) hours as needed for pain.   12 tablet   0   . insulin glargine (LANTUS) 100 UNIT/ML injection   Subcutaneous   Inject 35 Units into the skin at bedtime.   10 mL      . lisinopril (PRINIVIL,ZESTRIL) 20 MG tablet   Oral   Take 10 mg by mouth daily.         . pantoprazole (PROTONIX) 40 MG tablet   Oral   Take 40 mg by mouth 2 (two) times daily.         Marland Kitchen pyridOXINE (VITAMIN B-6) 100 MG tablet   Oral   Take 100 mg by  mouth daily. Hasn't started yet         . thiamine 100 MG tablet   Oral   Take 1 tablet (100 mg total) by mouth daily.   30 tablet   0     BP 112/59  Pulse 71  Temp(Src) 98.1 F (36.7 C) (Oral)  Resp 16  SpO2 98%  Physical Exam Gen: well developed and well nourished appearing, sleeping but arousable, initially had to be reoriented to his location. Seemed confused when I asked him why he came to the ED.  Head: NCAT Eyes: PERL, EOMI Nose: no epistaixis or rhinorrhea Mouth/throat: mucosa is moist and pink Neck: supple, no stridor Lungs: CTA B, no wheezing, rhonchi or rales CV: RRR, no murmur, ext well perfused Abd: soft, notender, nondistended Back: no ttp, no cva ttp Skin: no rashese, wnl Neuro: CN ii-xii grossly intact, no focal deficits, diminished finger to nose, difficulty with rapid alternating movements. + asterixis Psyche; oddl affect, problems following directions  calm and cooperative, tangential thought pattern, rambling history..   ED Course  Procedures (including critical care time)  Results for orders placed during the hospital encounter of 11/09/12 (from the past 24 hour(s))  GLUCOSE, CAPILLARY     Status: Abnormal   Collection Time    11/09/12  5:55 AM      Result Value Range   Glucose-Capillary 164 (*) 70 - 99 mg/dL  CBC     Status: Abnormal (Preliminary result)   Collection Time    11/09/12  6:21 AM      Result Value Range   WBC 3.6 (*) 4.0 - 10.5 K/uL   RBC 4.02 (*) 4.22 - 5.81 MIL/uL   Hemoglobin 12.0 (*) 13.0 - 17.0 g/dL   HCT 56.2 (*) 13.0 - 86.5 %   MCV 86.1  78.0 - 100.0 fL   MCH 29.9  26.0 - 34.0 pg   MCHC 34.7  30.0 - 36.0 g/dL   RDW 78.4 (*) 69.6 - 29.5 %   Platelets PENDING  150 - 400 K/uL    EKG: nsr, no acute ischemic changes, normal intervals, normal axis, normal qrs complex   MDM  Patient with suspected hepatic encephalopathy. We will tx with dose of lactulose while ammonia level is pending. Chart review shows that level has  been > 100 mg/dL on multiple recent occasions. Anticipate admission.         Brandt Loosen, MD 11/09/12 [phone]

## 2012-11-09 NOTE — ED Notes (Signed)
Per patient, out of medication-was at [facility] last night
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
## 2012-11-09 NOTE — ED Provider Notes (Signed)
Medical screening examination/treatment/procedure(s) were performed by non-physician practitioner and as supervising physician I was immediately available for consultation/collaboration.  Ethelda Chick, MD 11/09/12 1729

## 2012-11-09 NOTE — ED Notes (Signed)
Presents with chronic pain from neuropathy. +ETOH. Pt states he is unable to control his pain at home.

## 2012-11-09 NOTE — ED Notes (Signed)
Pt. States to MD, "out of meds for bipolar, chronic pain and lactulose."  Pt. Inconsistent in his reasons for being here unless prompted to recall initial complaints - gi ulcer, arthritic pain, out of meds."

## 2012-11-09 NOTE — ED Notes (Signed)
Spoke to Dr Juleen China about pt's CIWA score and his CBC results and the fact that patient reports his ammonia levels are high due to liver problems. Also the patient says he's type i diabetic and takes two insulins but only one is ordered and CBG's not ordered. EDP says labs are baseline for patient, he'll look at home medications to make sure that all have been ordered and he'll order CBG's and CIWA.

## 2012-11-09 NOTE — ED Provider Notes (Signed)
History  This chart was scribed for Darrell Ridge, PA-C by Ladona Ridgel Day, ED scribe. This patient was seen in room WTR4/WLPT4 and the patient's care was started at 1455.   CSN: [id number]  Arrival date & time 11/09/12  1455   First MD Initiated Contact with Patient 11/09/12 1629      Chief Complaint  Patient presents with  . med clearanace    The history is provided by the patient. No language interpreter was used.   HPI Comments: DARTANYAN DEASIS is a 57 y.o. male who presents to the Emergency Department complaining of suicidal thoughts and feeling depressed onset about a week ago. He was seen at [facility] yesterday and is now reportedly out of his medicines. He states has not been able to work lately and has been at home and not able to do anything. He states, "I dont wanna live no more, whats the sense." He denies a specific plan and states that he has not previously tried to harm himself before. He states has been out of medicines and unable to get them filled.     Past Medical History  Diagnosis Date  . Neuropathy   . Diabetes mellitus   . Bipolar affect, depressed   . Hypertension   . Arthritis   . Stroke     Mini stroke about 23yrs ago  . Cirrhosis   . Alcohol abuse     Past Surgical History  Procedure Laterality Date  . Fracture surgery      Leg and arm 46yrs ago  . Esophagogastroduodenoscopy  04/04/2012    Procedure: ESOPHAGOGASTRODUODENOSCOPY (EGD);  Surgeon: Hilarie Fredrickson, MD;  Location: [facility] ENDOSCOPY;  Service: Endoscopy;  Laterality: N/A;    Family History  Problem Relation Age of Onset  . Hypotension Mother     History  Substance Use Topics  . Smoking status: Current Every Day Smoker -- 0.50 packs/day for 30 years    Types: Cigarettes    Last Attempt to Quit: 04/06/2012  . Smokeless tobacco: Never Used     Comment: quit   . Alcohol Use: Not on file     Comment: last drank 6 cans beer at 0800 today      Review of Systems  Constitutional: Negative for fever and  chills.  Respiratory: Negative for shortness of breath.   Gastrointestinal: Negative for nausea and vomiting.  Neurological: Negative for weakness.  All other systems reviewed and are negative.   A complete 10 system review of systems was obtained and all systems are negative except as noted in the HPI and PMH.   Allergies  Review of patient's allergies indicates no known allergies.  Home Medications   Current Outpatient Rx  Name  Route  Sig  Dispense  Refill  . ALPRAZolam (XANAX) 0.5 MG tablet   Oral   Take 1 tablet (0.5 mg total) by mouth every 12 (twelve) hours. For anxiety   60 tablet   0   . cholecalciferol (VITAMIN D) 1000 UNITS tablet   Oral   Take 1,000 Units by mouth at bedtime.          . diclofenac sodium (VOLTAREN) 1 % GEL   Topical   Apply 2 g topically daily.         Marland Kitchen HYDROcodone-acetaminophen (NORCO) 10-325 MG per tablet   Oral   Take 1 tablet by mouth every 6 (six) hours as needed for pain.         Marland Kitchen HYDROcodone-acetaminophen (NORCO/VICODIN) 5-325 MG  per tablet   Oral   Take 1-2 tablets by mouth every 4 (four) hours as needed for pain.   12 tablet   0   . insulin glargine (LANTUS) 100 UNIT/ML injection   Subcutaneous   Inject 35 Units into the skin at bedtime.   10 mL      . lisinopril (PRINIVIL,ZESTRIL) 20 MG tablet   Oral   Take 10 mg by mouth daily.         . pantoprazole (PROTONIX) 40 MG tablet   Oral   Take 40 mg by mouth 2 (two) times daily.         Marland Kitchen pyridOXINE (VITAMIN B-6) 100 MG tablet   Oral   Take 100 mg by mouth daily. Hasn't started yet         . FLUoxetine (PROZAC) 20 MG capsule   Oral   Take 20 mg by mouth daily.           Triage Vitals: BP 147/78  Pulse 87  Temp(Src) 98.3 F (36.8 C) (Oral)  Resp 20  SpO2 98%  Physical Exam  Nursing note and vitals reviewed. Constitutional: He is oriented to person, place, and time. He appears well-developed and well-nourished. No distress.  HENT:  Head:  Normocephalic and atraumatic.  Mouth/Throat: Oropharynx is clear and moist.  Eyes: EOM are normal. Pupils are equal, round, and reactive to light.  Neck: Normal range of motion. Neck supple. No tracheal deviation present.  Cardiovascular: Normal rate and regular rhythm.  Exam reveals no gallop and no friction rub.   No murmur heard. Pulmonary/Chest: Effort normal and breath sounds normal. No respiratory distress.  Musculoskeletal: Normal range of motion.  Neurological: He is alert and oriented to person, place, and time. He exhibits normal muscle tone. Coordination normal.  Skin: Skin is warm and dry.  Psychiatric: His mood appears anxious. He is agitated. Thought content is not delusional. He exhibits a depressed mood. He expresses suicidal ideation. He expresses no suicidal plans and no homicidal plans.    ED Course  Procedures (including critical care time) DIAGNOSTIC STUDIES: Oxygen Saturation is 100% on room air, normal by my interpretation.    COORDINATION OF CARE: At 450 PM Discussed treatment plan with patient which includes drug screen, blood work UA, ETOH. Patient agrees.   Labs Reviewed  URINE RAPID DRUG SCREEN (HOSP PERFORMED) - Abnormal; Notable for the following:    Cocaine POSITIVE (*)    Benzodiazepines POSITIVE (*)    All other components within normal limits  CBC WITH DIFFERENTIAL  COMPREHENSIVE METABOLIC PANEL  ETHANOL  URINALYSIS, ROUTINE W REFLEX MICROSCOPIC    Patient will need act assessment.  MDM  I personally performed the services described in this documentation, which was scribed in my presence. The recorded information has been reviewed and is accurate.         Carlyle Dolly, PA-C 11/09/12 1723

## 2012-11-10 DIAGNOSIS — F329 Major depressive disorder, single episode, unspecified: Secondary | ICD-10-CM

## 2012-11-10 DIAGNOSIS — F411 Generalized anxiety disorder: Secondary | ICD-10-CM

## 2012-11-10 DIAGNOSIS — F1010 Alcohol abuse, uncomplicated: Secondary | ICD-10-CM

## 2012-11-10 DIAGNOSIS — F1020 Alcohol dependence, uncomplicated: Secondary | ICD-10-CM

## 2012-11-10 MED ORDER — LORAZEPAM 1 MG PO TABS: 1.0000 mg | ORAL_TABLET | Freq: Four times a day (QID) | ORAL | Status: DC | PRN

## 2012-11-10 MED ORDER — LORAZEPAM 1 MG PO TABS
0.0000 mg | ORAL_TABLET | Freq: Two times a day (BID) | ORAL | Status: DC
Start: 2012-11-12 — End: 2012-11-10

## 2012-11-10 MED ORDER — LORAZEPAM 2 MG/ML IJ SOLN: 1.0000 mg | Freq: Four times a day (QID) | INTRAMUSCULAR | Status: DC | PRN

## 2012-11-10 MED ORDER — LORAZEPAM 1 MG PO TABS
0.0000 mg | ORAL_TABLET | Freq: Four times a day (QID) | ORAL | Status: DC
  Administered 2012-11-10: 1 mg via ORAL
  Filled 2012-11-10: qty 2

## 2012-11-10 NOTE — ED Notes (Addendum)
D/C instructions given with understanding stated. No complaints voiced.

## 2012-11-10 NOTE — ED Notes (Signed)
Ambulatory without difficulty. No complaints voiced. Escorted by mental health tech to front of hospital. Belongings bag x1 returned.

## 2012-11-10 NOTE — ED Notes (Signed)
Written dc instructions reviewed w/ pt, OP referals given.  Pt encouraged to take his medications as directed and follow up with dr's or return for suicidal thoughts.  Pt verbalized understanding. Pt ambulatory to DC window w/ Mht.  Belongings returned after leaving the unit.
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
## 2012-11-10 NOTE — ED Provider Notes (Addendum)
Patient sleeping this morning. Appears to be medicially cleared. Awaiting Psych eval  [name]. Rubin Payor, MD 11/10/12 0719  On insulin at home. Lantus at night and something in the morning. Will attempt to find out what and will follow sugars.   [name]. Rubin Payor, MD 11/10/12 [phone]  Seen by Dr Shela Commons. Will d/c under his suggestion  [name]. Rubin Payor, MD 11/10/12 1031

## 2012-11-26 ENCOUNTER — Encounter (HOSPITAL_COMMUNITY): Payer: Self-pay | Admitting: *Deleted

## 2012-11-26 ENCOUNTER — Emergency Department (HOSPITAL_COMMUNITY)
Admission: EM | Admit: 2012-11-26 | Discharge: 2012-11-26 | Disposition: A | Discharge status: Home / Self Care | Payer: Medicare PPO | Attending: Emergency Medicine | Admitting: Emergency Medicine

## 2012-11-26 DIAGNOSIS — R609 Edema, unspecified: Secondary | ICD-10-CM | POA: Insufficient documentation

## 2012-11-26 DIAGNOSIS — M129 Arthropathy, unspecified: Secondary | ICD-10-CM | POA: Insufficient documentation

## 2012-11-26 DIAGNOSIS — I10 Essential (primary) hypertension: Secondary | ICD-10-CM | POA: Insufficient documentation

## 2012-11-26 DIAGNOSIS — Z794 Long term (current) use of insulin: Secondary | ICD-10-CM | POA: Insufficient documentation

## 2012-11-26 DIAGNOSIS — G589 Mononeuropathy, unspecified: Secondary | ICD-10-CM | POA: Insufficient documentation

## 2012-11-26 DIAGNOSIS — R011 Cardiac murmur, unspecified: Secondary | ICD-10-CM | POA: Insufficient documentation

## 2012-11-26 DIAGNOSIS — S0990XA Unspecified injury of head, initial encounter: Secondary | ICD-10-CM | POA: Insufficient documentation

## 2012-11-26 DIAGNOSIS — S0003XA Contusion of scalp, initial encounter: Secondary | ICD-10-CM | POA: Insufficient documentation

## 2012-11-26 DIAGNOSIS — F3130 Bipolar disorder, current episode depressed, mild or moderate severity, unspecified: Secondary | ICD-10-CM | POA: Insufficient documentation

## 2012-11-26 DIAGNOSIS — E860 Dehydration: Secondary | ICD-10-CM | POA: Insufficient documentation

## 2012-11-26 DIAGNOSIS — E1169 Type 2 diabetes mellitus with other specified complication: Secondary | ICD-10-CM | POA: Insufficient documentation

## 2012-11-26 DIAGNOSIS — Z8719 Personal history of other diseases of the digestive system: Secondary | ICD-10-CM | POA: Insufficient documentation

## 2012-11-26 DIAGNOSIS — M25529 Pain in unspecified elbow: Secondary | ICD-10-CM | POA: Insufficient documentation

## 2012-11-26 DIAGNOSIS — Y92009 Unspecified place in unspecified non-institutional (private) residence as the place of occurrence of the external cause: Secondary | ICD-10-CM | POA: Insufficient documentation

## 2012-11-26 DIAGNOSIS — F1010 Alcohol abuse, uncomplicated: Secondary | ICD-10-CM | POA: Insufficient documentation

## 2012-11-26 DIAGNOSIS — Y998 Other external cause status: Secondary | ICD-10-CM | POA: Insufficient documentation

## 2012-11-26 DIAGNOSIS — W06XXXA Fall from bed, initial encounter: Secondary | ICD-10-CM | POA: Insufficient documentation

## 2012-11-26 DIAGNOSIS — F17200 Nicotine dependence, unspecified, uncomplicated: Secondary | ICD-10-CM | POA: Insufficient documentation

## 2012-11-26 DIAGNOSIS — R739 Hyperglycemia, unspecified: Secondary | ICD-10-CM

## 2012-11-26 DIAGNOSIS — IMO0002 Reserved for concepts with insufficient information to code with codable children: Secondary | ICD-10-CM | POA: Insufficient documentation

## 2012-11-26 DIAGNOSIS — Z79899 Other long term (current) drug therapy: Secondary | ICD-10-CM | POA: Insufficient documentation

## 2012-11-26 DIAGNOSIS — Z8673 Personal history of transient ischemic attack (TIA), and cerebral infarction without residual deficits: Secondary | ICD-10-CM | POA: Insufficient documentation

## 2012-11-26 MED ORDER — NAPROXEN 500 MG PO TABS: 500.0000 mg | ORAL_TABLET | Freq: Two times a day (BID) | ORAL | Status: DC

## 2012-11-26 NOTE — ED Notes (Signed)
The pt fell just pta from his  Bed striking the lt side of his head.  No loc.  He just came in to be checked

## 2012-11-26 NOTE — ED Provider Notes (Signed)
History    CSN: [id number] Arrival date & time 11/26/12  0230  First MD Initiated Contact with Patient 11/26/12 [phone]     Chief Complaint  Patient presents with  . Fall   (Consider location/radiation/quality/duration/timing/severity/associated sxs/prior Treatment) HPI Comments: 57 year old male with a history of diabetes, hypertension, neuropathy, alcohol abuse, bipolar disorder. He presents after falling out of bed when he rolled over. He states that he struck the left side of his head on the bedside table or the ground, the fall was from approximately 2 feet. He denies any headaches, nausea, vomiting, blurred vision, difficulty speaking, he has mild pain in his bilateral elbows as that is how he cut himself when he hit the ground. He has been ambulatory and was able to walk from the waiting room back to his room without difficulty. This was acute in onset, occurred just prior to arrival, the symptoms are mild. He is unsure what his blood sugar is, he is a diabetic  Patient is a 57 y.o. male presenting with fall. The history is provided by the patient.  Fall   Past Medical History  Diagnosis Date  . Neuropathy   . Diabetes mellitus   . Bipolar affect, depressed   . Hypertension   . Arthritis   . Stroke     Mini stroke about 66yrs ago  . Cirrhosis   . Alcohol abuse    Past Surgical History  Procedure Laterality Date  . Fracture surgery      Leg and arm 48yrs ago  . Esophagogastroduodenoscopy  04/04/2012    Procedure: ESOPHAGOGASTRODUODENOSCOPY (EGD);  Surgeon: Hilarie Fredrickson, MD;  Location: [facility] ENDOSCOPY;  Service: Endoscopy;  Laterality: N/A;  . Eye surgery  8 months ago both eyes   Family History  Problem Relation Age of Onset  . Hypotension Mother    History  Substance Use Topics  . Smoking status: Current Every Day Smoker -- 0.50 packs/day for 30 years    Types: Cigarettes    Last Attempt to Quit: 04/06/2012  . Smokeless tobacco: Never Used     Comment: quit   .  Alcohol Use: 7.2 oz/week    12 Cans of beer per week     Comment: last drank 6 cans beer at 0800 today    Review of Systems  All other systems reviewed and are negative.    Allergies  Review of patient's allergies indicates no known allergies.  Home Medications   Current Outpatient Rx  Name  Route  Sig  Dispense  Refill  . ALPRAZolam (XANAX) 0.5 MG tablet   Oral   Take 1 tablet (0.5 mg total) by mouth every 12 (twelve) hours. For anxiety   60 tablet   0   . cholecalciferol (VITAMIN D) 1000 UNITS tablet   Oral   Take 1,000 Units by mouth at bedtime.          . diclofenac sodium (VOLTAREN) 1 % GEL   Topical   Apply 2 g topically daily.         Marland Kitchen FLUoxetine (PROZAC) 20 MG capsule   Oral   Take 20 mg by mouth daily.         Marland Kitchen HYDROcodone-acetaminophen (NORCO) 10-325 MG per tablet   Oral   Take 1 tablet by mouth every 6 (six) hours as needed for pain.         . Insulin Glargine (LANTUS SOLOSTAR) 100 UNIT/ML SOPN   Subcutaneous   Inject 25 Units into the skin  at bedtime.         Marland Kitchen lisinopril (PRINIVIL,ZESTRIL) 20 MG tablet   Oral   Take 20 mg by mouth daily.         . pantoprazole (PROTONIX) 40 MG tablet   Oral   Take 40 mg by mouth 2 (two) times daily.         Marland Kitchen pyridOXINE (VITAMIN B-6) 100 MG tablet   Oral   Take 100 mg by mouth daily. Hasn't started yet         . naproxen (NAPROSYN) 500 MG tablet   Oral   Take 1 tablet (500 mg total) by mouth 2 (two) times daily with a meal.   30 tablet   0    BP 129/52  Pulse 88  Temp(Src) 98.6 F (37 C) (Oral)  Resp 16  SpO2 95% Physical Exam  Nursing note and vitals reviewed. Constitutional: He appears well-developed and well-nourished. No distress.  HENT:  Head: Normocephalic.  Mouth/Throat: Oropharynx is clear and moist. No oropharyngeal exudate.  Superficial abrasion to the left scalp just above the ear, mild contusion to the left zygomatic area, no tenderness over this. No hemotympanum, no  malocclusion, no raccoon eyes, no battle sign.  Eyes: Conjunctivae and EOM are normal. Pupils are equal, round, and reactive to light. Right eye exhibits no discharge. Left eye exhibits no discharge. No scleral icterus.  Neck: Normal range of motion. Neck supple. No JVD present. No thyromegaly present.  Cardiovascular: Normal rate, regular rhythm and intact distal pulses.  Exam reveals no gallop and no friction rub.   Murmur ( Soft systolic murmur) heard. Pulmonary/Chest: Effort normal and breath sounds normal. No respiratory distress. He has no wheezes. He has no rales.  Abdominal: Soft. Bowel sounds are normal. He exhibits no distension and no mass. There is no tenderness.  Bruising to the abdominal wall consistent with prior medication injection use  Musculoskeletal: Normal range of motion. He exhibits edema ( Bilateral symmetrical pitting edema, 1+). He exhibits no tenderness.  Lymphadenopathy:    He has no cervical adenopathy.  Neurological: He is alert. Coordination normal.  Speech is clear, alert and oriented, follows commands, no ataxia, normal coordination, normal strength of all 4 extremities  Skin: Skin is warm and dry. No rash noted. No erythema.  Psychiatric: He has a normal mood and affect. His behavior is normal.    ED Course  Procedures (including critical care time) Labs Reviewed  GLUCOSE, CAPILLARY - Abnormal; Notable for the following:    Glucose-Capillary 273 (*)    All other components within normal limits   No results found. 1. Minor head injury, initial encounter   2. Hyperglycemia     MDM  The patient is well-appearing, his vital signs are normal, he has suffered a very mild head injury, there is no indication for neuro imaging at this time given the mechanism of the fall and has minimal symptoms. We'll check a CBG as the patient is a diabetic and has slight dehydration of his mucous membranes otherwise he can be discharged home with over-the-counter pain  medications.  CBG is 270, the patient has no other indication for imaging and appears stable for discharge, he was cautioned on following a diabetic diet and using his medications appropriately.  Meds given in ED:  Medications - No data to display  New Prescriptions   NAPROXEN (NAPROSYN) 500 MG TABLET    Take 1 tablet (500 mg total) by mouth 2 (two) times daily with a  meal.      Vida Roller, MD 11/26/12 [phone]

## 2012-12-12 ENCOUNTER — Encounter (HOSPITAL_COMMUNITY): Payer: Self-pay | Admitting: Emergency Medicine

## 2012-12-12 DIAGNOSIS — R791 Abnormal coagulation profile: Secondary | ICD-10-CM | POA: Diagnosis present

## 2012-12-12 DIAGNOSIS — G589 Mononeuropathy, unspecified: Secondary | ICD-10-CM | POA: Diagnosis present

## 2012-12-12 DIAGNOSIS — G8929 Other chronic pain: Secondary | ICD-10-CM | POA: Diagnosis present

## 2012-12-12 DIAGNOSIS — F319 Bipolar disorder, unspecified: Secondary | ICD-10-CM | POA: Diagnosis present

## 2012-12-12 DIAGNOSIS — F17200 Nicotine dependence, unspecified, uncomplicated: Secondary | ICD-10-CM | POA: Diagnosis present

## 2012-12-12 DIAGNOSIS — Z79899 Other long term (current) drug therapy: Secondary | ICD-10-CM

## 2012-12-12 DIAGNOSIS — K7030 Alcoholic cirrhosis of liver without ascites: Secondary | ICD-10-CM | POA: Diagnosis present

## 2012-12-12 DIAGNOSIS — R404 Transient alteration of awareness: Secondary | ICD-10-CM | POA: Diagnosis present

## 2012-12-12 DIAGNOSIS — F1010 Alcohol abuse, uncomplicated: Secondary | ICD-10-CM | POA: Diagnosis present

## 2012-12-12 DIAGNOSIS — E119 Type 2 diabetes mellitus without complications: Secondary | ICD-10-CM | POA: Diagnosis present

## 2012-12-12 DIAGNOSIS — D696 Thrombocytopenia, unspecified: Secondary | ICD-10-CM | POA: Diagnosis present

## 2012-12-12 DIAGNOSIS — Z8673 Personal history of transient ischemic attack (TIA), and cerebral infarction without residual deficits: Secondary | ICD-10-CM

## 2012-12-12 DIAGNOSIS — L03119 Cellulitis of unspecified part of limb: Principal | ICD-10-CM | POA: Diagnosis present

## 2012-12-12 DIAGNOSIS — F1020 Alcohol dependence, uncomplicated: Secondary | ICD-10-CM | POA: Diagnosis present

## 2012-12-12 DIAGNOSIS — D899 Disorder involving the immune mechanism, unspecified: Secondary | ICD-10-CM | POA: Diagnosis present

## 2012-12-12 DIAGNOSIS — D61818 Other pancytopenia: Secondary | ICD-10-CM | POA: Diagnosis present

## 2012-12-12 DIAGNOSIS — Z794 Long term (current) use of insulin: Secondary | ICD-10-CM

## 2012-12-12 DIAGNOSIS — M79609 Pain in unspecified limb: Secondary | ICD-10-CM

## 2012-12-12 DIAGNOSIS — M7989 Other specified soft tissue disorders: Secondary | ICD-10-CM

## 2012-12-12 DIAGNOSIS — I10 Essential (primary) hypertension: Secondary | ICD-10-CM | POA: Diagnosis present

## 2012-12-12 DIAGNOSIS — F1410 Cocaine abuse, uncomplicated: Secondary | ICD-10-CM | POA: Diagnosis present

## 2012-12-12 DIAGNOSIS — L02419 Cutaneous abscess of limb, unspecified: Principal | ICD-10-CM | POA: Diagnosis present

## 2012-12-12 NOTE — ED Notes (Signed)
PT. REPORTS WORSENING BILATERAL FEET/LOWER LEGS PAIN WITH SWELLING FOR SEVERAL WEEKS , DENIES FEVER OR CHILLS.

## 2012-12-13 ENCOUNTER — Emergency Department (HOSPITAL_COMMUNITY): Payer: Medicare PPO

## 2012-12-13 ENCOUNTER — Encounter (HOSPITAL_COMMUNITY): Payer: Self-pay | Admitting: Radiology

## 2012-12-13 ENCOUNTER — Inpatient Hospital Stay (HOSPITAL_COMMUNITY)
Admission: EM | Admit: 2012-12-13 | Discharge: 2012-12-15 | DRG: 603 | Disposition: A | Discharge status: Home / Self Care | Payer: Medicare PPO | Attending: Internal Medicine | Admitting: Internal Medicine

## 2012-12-13 DIAGNOSIS — R400 Somnolence: Secondary | ICD-10-CM | POA: Diagnosis present

## 2012-12-13 DIAGNOSIS — F3130 Bipolar disorder, current episode depressed, mild or moderate severity, unspecified: Secondary | ICD-10-CM | POA: Diagnosis present

## 2012-12-13 DIAGNOSIS — L03119 Cellulitis of unspecified part of limb: Secondary | ICD-10-CM

## 2012-12-13 DIAGNOSIS — L02419 Cutaneous abscess of limb, unspecified: Principal | ICD-10-CM

## 2012-12-13 DIAGNOSIS — D696 Thrombocytopenia, unspecified: Secondary | ICD-10-CM

## 2012-12-13 DIAGNOSIS — F1010 Alcohol abuse, uncomplicated: Secondary | ICD-10-CM

## 2012-12-13 DIAGNOSIS — F1410 Cocaine abuse, uncomplicated: Secondary | ICD-10-CM | POA: Diagnosis present

## 2012-12-13 DIAGNOSIS — I10 Essential (primary) hypertension: Secondary | ICD-10-CM | POA: Diagnosis present

## 2012-12-13 DIAGNOSIS — E119 Type 2 diabetes mellitus without complications: Secondary | ICD-10-CM | POA: Diagnosis present

## 2012-12-13 DIAGNOSIS — E118 Type 2 diabetes mellitus with unspecified complications: Secondary | ICD-10-CM

## 2012-12-13 LAB — COMPREHENSIVE METABOLIC PANEL
ALT: 22 U/L (ref 0–53)
AST: 53 U/L — ABNORMAL HIGH (ref 0–37)
CO2: 24 mEq/L (ref 19–32)
Calcium: 8.5 mg/dL (ref 8.4–10.5)
Creatinine, Ser: 0.75 mg/dL (ref 0.50–1.35)
GFR calc Af Amer: >90 mL/min (ref >90)
GFR calc non Af Amer: >90 mL/min (ref >90)
Sodium: 137 mEq/L (ref 135–145)
Total Protein: 6.6 g/dL (ref 6.0–8.3)

## 2012-12-13 LAB — CBC WITH DIFFERENTIAL/PLATELET
Basophils Absolute: 0 10*3/uL (ref 0.0–0.1)
Eosinophils Absolute: 0.2 10*3/uL (ref 0.0–0.7)
Eosinophils Relative: 7 % — ABNORMAL HIGH (ref 0–5)
HCT: 32.3 % — ABNORMAL LOW (ref 39.0–52.0)
MCH: 30.4 pg (ref 26.0–34.0)
MCHC: 33.1 g/dL (ref 30.0–36.0)
MCV: 91.8 fL (ref 78.0–100.0)
Monocytes Absolute: 0.4 10*3/uL (ref 0.1–1.0)
Platelets: 57 10*3/uL — ABNORMAL LOW (ref 150–400)
RDW: 17.4 % — ABNORMAL HIGH (ref 11.5–15.5)

## 2012-12-13 LAB — CBC
HCT: 29.5 % — ABNORMAL LOW (ref 39.0–52.0)
Hemoglobin: 10 g/dL — ABNORMAL LOW (ref 13.0–17.0)
WBC: 2.7 10*3/uL — ABNORMAL LOW (ref 4.0–10.5)

## 2012-12-13 LAB — ETHANOL: Alcohol, Ethyl (B): 119 mg/dL — ABNORMAL HIGH (ref 0–11)

## 2012-12-13 LAB — CREATININE, SERUM
GFR calc Af Amer: >90 mL/min (ref >90)
GFR calc non Af Amer: >90 mL/min (ref >90)

## 2012-12-13 LAB — GLUCOSE, CAPILLARY
Glucose-Capillary: 116 mg/dL — ABNORMAL HIGH (ref 70–99)
Glucose-Capillary: 163 mg/dL — ABNORMAL HIGH (ref 70–99)
Glucose-Capillary: 181 mg/dL — ABNORMAL HIGH (ref 70–99)

## 2012-12-13 LAB — D-DIMER, QUANTITATIVE: D-Dimer, Quant: 7.94 ug/mL-FEU — ABNORMAL HIGH (ref 0.00–0.48)

## 2012-12-13 LAB — PROTIME-INR: INR: 1.42 (ref 0.00–1.49)

## 2012-12-13 MED ORDER — INSULIN ASPART 100 UNIT/ML ~~LOC~~ SOLN
0.0000 [IU] | Freq: Three times a day (TID) | SUBCUTANEOUS | Status: DC
  Administered 2012-12-13 (×2): 3 [IU] via SUBCUTANEOUS
  Administered 2012-12-14 (×2): 2 [IU] via SUBCUTANEOUS
  Administered 2012-12-14: 3 [IU] via SUBCUTANEOUS

## 2012-12-13 MED ORDER — INSULIN GLARGINE 100 UNIT/ML SOLOSTAR PEN: 5.0000 [IU] | PEN_INJECTOR | Freq: Two times a day (BID) | SUBCUTANEOUS | Status: DC

## 2012-12-13 MED ORDER — LORAZEPAM 2 MG/ML IJ SOLN
0.0000 mg | Freq: Four times a day (QID) | INTRAMUSCULAR | Status: AC
  Administered 2012-12-13: 4 mg via INTRAVENOUS
  Administered 2012-12-13: 1 mg via INTRAVENOUS
  Administered 2012-12-13: 2 mg via INTRAVENOUS
  Administered 2012-12-14: 4 mg via INTRAVENOUS
  Administered 2012-12-14 (×3): 2 mg via INTRAVENOUS
  Filled 2012-12-13: qty 1

## 2012-12-13 MED ORDER — LISINOPRIL 20 MG PO TABS
20.0000 mg | ORAL_TABLET | Freq: Every day | ORAL | Status: DC
  Administered 2012-12-13 – 2012-12-15 (×3): 20 mg via ORAL
  Filled 2012-12-13 (×3): qty 1

## 2012-12-13 MED ORDER — VANCOMYCIN HCL 10 G IV SOLR
1500.0000 mg | Freq: Once | INTRAVENOUS | Status: AC
  Administered 2012-12-13: 1500 mg via INTRAVENOUS
  Filled 2012-12-13: qty 1500

## 2012-12-13 MED ORDER — VITAMIN D3 25 MCG (1000 UNIT) PO TABS
1000.0000 [IU] | ORAL_TABLET | Freq: Every day | ORAL | Status: DC
  Administered 2012-12-13 – 2012-12-14 (×2): 1000 [IU] via ORAL
  Filled 2012-12-13 (×3): qty 1

## 2012-12-13 MED ORDER — VITAMIN B-6 100 MG PO TABS
100.0000 mg | ORAL_TABLET | Freq: Every day | ORAL | Status: DC
  Administered 2012-12-13 – 2012-12-15 (×3): 100 mg via ORAL
  Filled 2012-12-13 (×3): qty 1

## 2012-12-13 MED ORDER — ADULT MULTIVITAMIN W/MINERALS CH
1.0000 | ORAL_TABLET | Freq: Every day | ORAL | Status: DC
  Administered 2012-12-13 – 2012-12-15 (×3): 1 via ORAL
  Filled 2012-12-13 (×3): qty 1

## 2012-12-13 MED ORDER — INSULIN ASPART 100 UNIT/ML ~~LOC~~ SOLN: 0.0000 [IU] | Freq: Every day | SUBCUTANEOUS | Status: DC

## 2012-12-13 MED ORDER — LORAZEPAM 2 MG/ML IJ SOLN
1.0000 mg | Freq: Four times a day (QID) | INTRAMUSCULAR | Status: DC | PRN
  Administered 2012-12-13 (×2): 1 mg via INTRAVENOUS
  Filled 2012-12-13 (×7): qty 1
  Filled 2012-12-13: qty 2

## 2012-12-13 MED ORDER — THIAMINE HCL 100 MG/ML IJ SOLN
100.0000 mg | Freq: Every day | INTRAMUSCULAR | Status: DC
  Filled 2012-12-13 (×3): qty 1

## 2012-12-13 MED ORDER — ONDANSETRON HCL 4 MG/2ML IJ SOLN: 4.0000 mg | Freq: Four times a day (QID) | INTRAMUSCULAR | Status: DC | PRN

## 2012-12-13 MED ORDER — INSULIN GLARGINE 100 UNIT/ML ~~LOC~~ SOLN
45.0000 [IU] | Freq: Every day | SUBCUTANEOUS | Status: DC
  Administered 2012-12-13 – 2012-12-14 (×2): 45 [IU] via SUBCUTANEOUS
  Filled 2012-12-13 (×5): qty 0.45

## 2012-12-13 MED ORDER — VITAMIN B-1 100 MG PO TABS
100.0000 mg | ORAL_TABLET | Freq: Every day | ORAL | Status: DC
  Administered 2012-12-13 – 2012-12-15 (×3): 100 mg via ORAL
  Filled 2012-12-13 (×3): qty 1

## 2012-12-13 MED ORDER — MORPHINE SULFATE 2 MG/ML IJ SOLN
2.0000 mg | INTRAMUSCULAR | Status: DC | PRN
  Administered 2012-12-13 – 2012-12-14 (×2): 2 mg via INTRAVENOUS
  Filled 2012-12-13 (×2): qty 1

## 2012-12-13 MED ORDER — VANCOMYCIN HCL IN DEXTROSE 1-5 GM/200ML-% IV SOLN
1000.0000 mg | Freq: Two times a day (BID) | INTRAVENOUS | Status: DC
  Administered 2012-12-13 – 2012-12-14 (×3): 1000 mg via INTRAVENOUS
  Filled 2012-12-13 (×4): qty 200

## 2012-12-13 MED ORDER — LORAZEPAM 2 MG/ML IJ SOLN
0.0000 mg | Freq: Two times a day (BID) | INTRAMUSCULAR | Status: DC
  Filled 2012-12-13: qty 1

## 2012-12-13 MED ORDER — HYDROCODONE-ACETAMINOPHEN 10-325 MG PO TABS
1.0000 | ORAL_TABLET | Freq: Four times a day (QID) | ORAL | Status: DC | PRN
  Administered 2012-12-13 – 2012-12-15 (×4): 1 via ORAL
  Filled 2012-12-13 (×4): qty 1

## 2012-12-13 MED ORDER — ONDANSETRON HCL 4 MG PO TABS: 4.0000 mg | ORAL_TABLET | Freq: Four times a day (QID) | ORAL | Status: DC | PRN

## 2012-12-13 MED ORDER — ALPRAZOLAM 0.5 MG PO TABS
0.5000 mg | ORAL_TABLET | Freq: Two times a day (BID) | ORAL | Status: DC
  Administered 2012-12-13 – 2012-12-15 (×5): 0.5 mg via ORAL
  Filled 2012-12-13 (×5): qty 1

## 2012-12-13 MED ORDER — FUROSEMIDE 10 MG/ML IJ SOLN
40.0000 mg | Freq: Once | INTRAMUSCULAR | Status: AC
  Administered 2012-12-13: 40 mg via INTRAVENOUS
  Filled 2012-12-13: qty 4

## 2012-12-13 MED ORDER — DOCUSATE SODIUM 100 MG PO CAPS
100.0000 mg | ORAL_CAPSULE | Freq: Two times a day (BID) | ORAL | Status: DC
  Administered 2012-12-13 – 2012-12-14 (×4): 100 mg via ORAL
  Filled 2012-12-13 (×6): qty 1

## 2012-12-13 MED ORDER — SODIUM CHLORIDE 0.9 % IV SOLN
INTRAVENOUS | Status: DC
  Administered 2012-12-13 – 2012-12-14 (×2): via INTRAVENOUS

## 2012-12-13 MED ORDER — LORAZEPAM 1 MG PO TABS
1.0000 mg | ORAL_TABLET | Freq: Four times a day (QID) | ORAL | Status: DC | PRN
  Filled 2012-12-13: qty 1

## 2012-12-13 MED ORDER — CIPROFLOXACIN IN D5W 400 MG/200ML IV SOLN
400.0000 mg | Freq: Two times a day (BID) | INTRAVENOUS | Status: DC
  Administered 2012-12-13 – 2012-12-14 (×3): 400 mg via INTRAVENOUS
  Filled 2012-12-13 (×4): qty 200

## 2012-12-13 MED ORDER — FLUOXETINE HCL 20 MG PO CAPS
20.0000 mg | ORAL_CAPSULE | Freq: Every day | ORAL | Status: DC
Start: 2012-12-13 — End: 2012-12-15
  Administered 2012-12-13 – 2012-12-15 (×3): 20 mg via ORAL
  Filled 2012-12-13 (×3): qty 1

## 2012-12-13 MED ORDER — IOHEXOL 350 MG/ML SOLN
100.0000 mL | Freq: Once | INTRAVENOUS | Status: AC | PRN
  Administered 2012-12-13: 100 mL via INTRAVENOUS

## 2012-12-13 MED ORDER — PANTOPRAZOLE SODIUM 40 MG PO TBEC
40.0000 mg | DELAYED_RELEASE_TABLET | Freq: Two times a day (BID) | ORAL | Status: DC
  Administered 2012-12-13 – 2012-12-15 (×5): 40 mg via ORAL
  Filled 2012-12-13 (×4): qty 1

## 2012-12-13 MED ORDER — FOLIC ACID 1 MG PO TABS
1.0000 mg | ORAL_TABLET | Freq: Every day | ORAL | Status: DC
  Administered 2012-12-13 – 2012-12-15 (×3): 1 mg via ORAL
  Filled 2012-12-13 (×3): qty 1

## 2012-12-13 MED ORDER — INSULIN GLARGINE 100 UNIT/ML ~~LOC~~ SOLN
5.0000 [IU] | Freq: Every day | SUBCUTANEOUS | Status: DC
  Administered 2012-12-13 – 2012-12-15 (×3): 5 [IU] via SUBCUTANEOUS
  Filled 2012-12-13 (×3): qty 0.05

## 2012-12-13 NOTE — Progress Notes (Signed)
[facility] PROGRESS NOTE  [name] WUJ:[id number] DOB: [birth year] DOA: 12/13/2012 PCP: Lonia Blood, MD  SAME DAY NOTE  Assessment/Plan: H&P from this AM reviewed . Cellulitis of lower leg - On vanc and cipro empirically - Afebrile - LE dopplers pending to r/o LE DVT  . Cocaine abuse - Will counsel against use  . Diabetes mellitus - On SSI with home inusulin  . Alcohol abuse - CIWA protocol  . HTN (hypertension) - BP stable - Cont meds for now  . Bipolar affect, depressed   Pancytopenia -likely secondary to BM suppression secondary to chronic ETOH abuse  Chronic Pain: - NCCSR ([place] Controlled Substance Registry) reviewed - Chronic narcotics prescribed by [name] - Last alprazolam rx dispensed on 12/04/12, 90 tabs for 30 days - Last lortab rx dispensed on 11/22/12 for 120 tabs, 30 day supply - Last UDS on 11/09/12 pos for benzos and cocaine. NO OPIATES NOTED even though per NCCSR, pt should have been taking lortab at that time  Code Status: Full Family Communication: Pt in room (indicate person spoken with, relationship, and if by phone, the number) Disposition Plan: Pending  Procedures:  LE dopplers pending  Antibiotics:  Vanc 12/14/11>>>  Cipro 12/13/12>>>  HPI/Subjective: No acute events noted by staff  Objective: Filed Vitals:   12/13/12 0530 12/13/12 0547 12/13/12 0600 12/13/12 0646  BP: 145/72 145/72 146/71 158/74  Pulse: 86 85 87 88  Temp:    97.9 F (36.6 C)  TempSrc:    Oral  Resp: 16  16   Height:    5\' 10"  (1.778 m)  Weight:    113.4 kg (250 lb)  SpO2: 94%  92%     Intake/Output Summary (Last 24 hours) at 12/13/12 1006 Last data filed at 12/13/12 0531  Gross per 24 hour  Intake    500 ml  Output   2050 ml  Net  -1550 ml   Filed Weights   12/13/12 0646  Weight: 113.4 kg (250 lb)     Data Reviewed: Basic Metabolic Panel:  Recent Labs Lab 12/12/12 2356  NA 137  Baker 4.1  CL 104  CO2 24  GLUCOSE 231*  BUN 5*   CREATININE 0.75  CALCIUM 8.5   Liver Function Tests:  Recent Labs Lab 12/12/12 2356  AST 53*  ALT 22  ALKPHOS 160*  BILITOT 1.6*  PROT 6.6  ALBUMIN 2.8*   No results found for this basename: LIPASE, AMYLASE,  in the last 168 hours No results found for this basename: AMMONIA,  in the last 168 hours CBC:  Recent Labs Lab 12/12/12 2356 12/13/12 0840  WBC 2.9* 2.7*  NEUTROABS 1.7  --   HGB 10.7* 10.0*  HCT 32.3* 29.5*  MCV 91.8 90.8  PLT 57* 47*   Cardiac Enzymes: No results found for this basename: CKTOTAL, CKMB, CKMBINDEX, TROPONINI,  in the last 168 hours BNP (last 3 results) No results found for this basename: PROBNP,  in the last 8760 hours CBG: No results found for this basename: GLUCAP,  in the last 168 hours  No results found for this or any previous visit (from the past 240 hour(s)).   Studies: Dg Chest 2 View  12/13/2012   *RADIOLOGY REPORT*  Clinical Data: Shortness of breath  CHEST - 2 VIEW  Comparison: Prior radiograph from 06/19/2012  Findings: The cardiac silhouette is stable in size and contour, and remain within normal limits.  The lungs are normally inflated. There is no pulmonary edema.  No  pleural effusion. No pneumothorax.  Bony thorax is intact.  IMPRESSION:  No active cardiopulmonary disease.  No change from prior.   Original Report Authenticated By: Rise Mu, M.D.   Ct Angio Chest Pe W/cm &/or Wo Cm  12/13/2012   *RADIOLOGY REPORT*  Clinical Data: Shortness of breath.  Positive D-dimer.  Lower extremity complains for several weeks.  CT ANGIOGRAPHY CHEST  Technique:  Multidetector CT imaging of the chest using the standard protocol during bolus administration of intravenous contrast. Multiplanar reconstructed images including MIPs were obtained and reviewed to evaluate the vascular anatomy.  Contrast: OMNIPAQUE IOHEXOL 350 MG/ML SOLN  Comparison: None.  Findings: Technically adequate study with moderately good opacification of the  central and proximal segmental pulmonary arteries.  More peripheral pulmonary arteries are not well opacified and smaller emboli may be obscured.  However, no filling defects are demonstrated in the opacified central pulmonary arteries suggesting no large pulmonary embolus.  The heart size is normal.  Normal caliber thoracic aorta.  Motion artifact limits visualization of the aorta.  No significant lymphadenopathy in the chest.  The esophagus is gas filled without significant distension.  This may represent dysmotility.  The thyroid gland is homogeneous.  No pleural effusions.  Visualization of the lungs is limited due to respiratory motion artifact.  No gross consolidation or pneumothorax is appreciated.  Visualized portions of the upper abdominal organs demonstrate changes of hepatic cirrhosis with splenic enlargement and upper abdominal ascites.  Multiple varices.  Upper abdominal lymph nodes probably related to cirrhosis and ascites.  Normal alignment of the thoracic vertebrae.  IMPRESSION: No evidence of significant central pulmonary embolus although peripheral vessels are not well opacified and smaller emboli could be obscured.  Upper abdomen demonstrates hepatic cirrhosis with splenic enlargement and multiple varices.  Moderate ascites.   Original Report Authenticated By: Burman Nieves, M.D.    Scheduled Meds: . ALPRAZolam  0.5 mg Oral Q12H  . cholecalciferol  1,000 Units Oral QHS  . ciprofloxacin  400 mg Intravenous Q12H  . docusate sodium  100 mg Oral BID  . FLUoxetine  20 mg Oral Daily  . folic acid  1 mg Oral Daily  . insulin aspart  0-15 Units Subcutaneous TID WC  . insulin aspart  0-5 Units Subcutaneous QHS  . insulin glargine  45 Units Subcutaneous QPC supper  . insulin glargine  5 Units Subcutaneous QPC breakfast  . lisinopril  20 mg Oral Daily  . LORazepam  0-4 mg Intravenous Q6H   Followed by  . [START ON 12/15/2012] LORazepam  0-4 mg Intravenous Q12H  . multivitamin with minerals   1 tablet Oral Daily  . pantoprazole  40 mg Oral BID  . pyridOXINE  100 mg Oral Daily  . thiamine  100 mg Oral Daily   Or  . thiamine  100 mg Intravenous Daily  . vancomycin  1,000 mg Intravenous Q12H   Continuous Infusions: . sodium chloride 100 mL/hr at 12/13/12 0800    Principal Problem:   Cellulitis of lower leg Active Problems:   Alcohol abuse   HTN (hypertension)   Diabetes mellitus   Somnolence   Cocaine abuse   Bipolar affect, depressed     [name], [name]  [facility] Pager [phone]. If 7PM-7AM, please contact night-coverage at [email or web address], password [facility] 12/13/2012, 10:06 AM  LOS: 0 days

## 2012-12-13 NOTE — Progress Notes (Signed)
ANTIBIOTIC CONSULT NOTE - INITIAL  Pharmacy Consult for vancomycin Indication: cellulitis  No Known Allergies  Patient Measurements: Height: 5\' 10"  (177.8 cm) Weight: 250 lb (113.4 kg) IBW/kg (Calculated) : 73  Vital Signs: Temp: 97.9 F (36.6 C) (07/24 0646) Temp src: Oral (07/24 0646) BP: 158/74 mmHg (07/24 0646) Pulse Rate: 88 (07/24 0646) Intake/Output from previous day: 07/23 0701 - 07/24 0700 In: 500 [I.V.:500] Out: 2050 [Urine:2050] Intake/Output from this shift: Total I/O In: 500 [I.V.:500] Out: 2050 [Urine:2050]  Labs:  Recent Labs  12/12/12 2356  WBC 2.9*  HGB 10.7*  PLT 57*  CREATININE 0.75   Estimated Creatinine Clearance: 130.1 ml/min (by C-G formula based on Cr of 0.75).   Microbiology: No results found for this or any previous visit (from the past 720 hour(s)).  Medical History: Past Medical History  Diagnosis Date  . Neuropathy   . Diabetes mellitus   . Bipolar affect, depressed   . Hypertension   . Arthritis   . Stroke     Mini stroke about 105yrs ago  . Cirrhosis   . Alcohol abuse     Medications:  Prescriptions prior to admission  Medication Sig Dispense Refill  . ALPRAZolam (XANAX) 0.5 MG tablet Take 1 tablet (0.5 mg total) by mouth every 12 (twelve) hours. For anxiety  60 tablet  0  . cholecalciferol (VITAMIN D) 1000 UNITS tablet Take 1,000 Units by mouth at bedtime.       . diclofenac sodium (VOLTAREN) 1 % GEL Apply 2 g topically daily.      Marland Kitchen FLUoxetine (PROZAC) 20 MG capsule Take 20 mg by mouth daily.      Marland Kitchen HYDROcodone-acetaminophen (NORCO) 10-325 MG per tablet Take 1 tablet by mouth every 6 (six) hours as needed for pain.      . Insulin Glargine (LANTUS SOLOSTAR) 100 UNIT/ML SOPN Inject 5-45 Units into the skin 2 (two) times daily. 5 units in the morning and 45 units during the day      . lisinopril (PRINIVIL,ZESTRIL) 20 MG tablet Take 20 mg by mouth daily.      . naproxen (NAPROSYN) 500 MG tablet Take 1 tablet (500 mg total)  by mouth 2 (two) times daily with a meal.  30 tablet  0  . pantoprazole (PROTONIX) 40 MG tablet Take 40 mg by mouth 2 (two) times daily.      Marland Kitchen pyridOXINE (VITAMIN B-6) 100 MG tablet Take 100 mg by mouth daily.        Scheduled:  . ALPRAZolam  0.5 mg Oral Q12H  . cholecalciferol  1,000 Units Oral QHS  . ciprofloxacin  400 mg Intravenous Q12H  . docusate sodium  100 mg Oral BID  . FLUoxetine  20 mg Oral Daily  . folic acid  1 mg Oral Daily  . insulin aspart  0-15 Units Subcutaneous TID WC  . insulin aspart  0-5 Units Subcutaneous QHS  . insulin glargine  5 Units Subcutaneous QPC breakfast  . Insulin Glargine  5-45 Units Subcutaneous BID  . lisinopril  20 mg Oral Daily  . LORazepam  0-4 mg Intravenous Q6H   Followed by  . [START ON 12/15/2012] LORazepam  0-4 mg Intravenous Q12H  . multivitamin with minerals  1 tablet Oral Daily  . pantoprazole  40 mg Oral BID  . pyridOXINE  100 mg Oral Daily  . thiamine  100 mg Oral Daily   Or  . thiamine  100 mg Intravenous Daily  . vancomycin  1,000 mg  Intravenous Q12H   Infusions:  . sodium chloride      Assessment: 57yo male c/o worsening BLE pain and swelling for several weeks, D-dimer elevated, CT negative for PE, awaiting doppler, to begin vancomycin for cellulitis.  Goal of Therapy:  Vancomycin trough level 10-15 mcg/ml  Plan:  Rec'd vanc 1.5g IV in ED; will continue with vancomycin 1000mg  IV Q12H and monitor CBC, Cx, levels prn.  Vernard Gambles, PharmD, BCPS  12/13/2012,7:00 AM

## 2012-12-13 NOTE — H&P (Signed)
[facility] History and Physical  [name] WJX:[id number] DOB: 05-29-1955    PCP:   Lonia Blood, MD   Chief Complaint: leg swelling and pain.  HPI: [name] is an 57 y.o. male with polysubstance abuse including cocaine, alcohol, cigarettes, DM, prior CVA, HTN, bipolar illness, alcoholic cirrhosis, presents to the ER intoxicated, with complaint of increasing pain, redness and swelling of his left lower leg.  He has basline shortness of breath he said hasn't changed.  He denied fever, chills, nausea or vomiting.  No pleuritic CP.  He has no distant travel or ill contacts.  He has hx of thrombocytopenia also.  Evaluation in the ER included an elevated D-dimer, with negative CTPA, WBC of 2.9K, with Hb of 10.7 g/DL, platelet count of 21H, normal renal fx tests, and normal INR.  His alcohol level is 100 mg/DL. His BS was in the mid 200s range.  Hospitalist was asked to admit him for cellultis.  Rewiew of Systems:  Constitutional: Negative for malaise, fever and chills. No significant weight loss or weight gain Eyes: Negative for eye pain, redness and discharge, diplopia, visual changes, or flashes of light. ENMT: Negative for ear pain, hoarseness, nasal congestion, sinus pressure and sore throat. No headaches; tinnitus, drooling, or problem swallowing. Cardiovascular: Negative for chest pain, palpitations, diaphoresis, dyspnea and peripheral edema. ; No orthopnea, PND Respiratory: Negative for cough, hemoptysis, wheezing and stridor. No pleuritic chestpain. Gastrointestinal: Negative for nausea, vomiting, diarrhea, constipation, abdominal pain, melena, blood in stool, hematemesis, jaundice and rectal bleeding.    Genitourinary: Negative for frequency, dysuria, incontinence,flank pain and hematuria; Musculoskeletal: Negative for back pain and neck pain. Skin: . Negative for pruritus, rash, abrasions, bruising and skin lesion.; ulcerations Neuro: Negative for headache, lightheadedness  and neck stiffness. Negative for weakness, altered level of consciousness ,  extremity weakness, burning feet, involuntary movement, seizure and syncope.  Psych: negative for anxiety, depression, insomnia, tearfulness, panic attacks, hallucinations, paranoia, suicidal or homicidal ideation    Past Medical History  Diagnosis Date  . Neuropathy   . Diabetes mellitus   . Bipolar affect, depressed   . Hypertension   . Arthritis   . Stroke     Mini stroke about 33yrs ago  . Cirrhosis   . Alcohol abuse     Past Surgical History  Procedure Laterality Date  . Fracture surgery      Leg and arm 62yrs ago  . Esophagogastroduodenoscopy  04/04/2012    Procedure: ESOPHAGOGASTRODUODENOSCOPY (EGD);  Surgeon: Hilarie Fredrickson, MD;  Location: [facility] ENDOSCOPY;  Service: Endoscopy;  Laterality: N/A;  . Eye surgery  8 months ago both eyes    Medications:  HOME MEDS: Prior to Admission medications   Medication Sig Start Date End Date Taking? Authorizing Provider  ALPRAZolam Prudy Feeler) 0.5 MG tablet Take 1 tablet (0.5 mg total) by mouth every 12 (twelve) hours. For anxiety 07/20/12  Yes Sorin Luanne Bras, MD  cholecalciferol (VITAMIN D) 1000 UNITS tablet Take 1,000 Units by mouth at bedtime.    Yes Historical Provider, MD  diclofenac sodium (VOLTAREN) 1 % GEL Apply 2 g topically daily.   Yes Historical Provider, MD  FLUoxetine (PROZAC) 20 MG capsule Take 20 mg by mouth daily.   Yes Historical Provider, MD  HYDROcodone-acetaminophen (NORCO) 10-325 MG per tablet Take 1 tablet by mouth every 6 (six) hours as needed for pain.   Yes Historical Provider, MD  Insulin Glargine (LANTUS SOLOSTAR) 100 UNIT/ML SOPN Inject 5-45 Units into the skin 2 (two)  times daily. 5 units in the morning and 45 units during the day   Yes Historical Provider, MD  lisinopril (PRINIVIL,ZESTRIL) 20 MG tablet Take 20 mg by mouth daily.   Yes Historical Provider, MD  naproxen (NAPROSYN) 500 MG tablet Take 1 tablet (500 mg total) by mouth 2 (two)  times daily with a meal. 11/26/12  Yes Vida Roller, MD  pantoprazole (PROTONIX) 40 MG tablet Take 40 mg by mouth 2 (two) times daily.   Yes Historical Provider, MD  pyridOXINE (VITAMIN B-6) 100 MG tablet Take 100 mg by mouth daily.    Yes Historical Provider, MD     Allergies:  No Known Allergies  Social History:   reports that he has been smoking Cigarettes.  He has a 15 pack-year smoking history. He has never used smokeless tobacco. He reports that he drinks about 7.2 ounces of alcohol per week. He reports that he does not use illicit drugs.  Family History: Family History  Problem Relation Age of Onset  . Hypotension Mother      Physical Exam: Filed Vitals:   12/13/12 0230 12/13/12 0300 12/13/12 0330 12/13/12 0530  BP: 127/63 106/52 114/69 145/72  Pulse: 82 81 83 86  Temp:      TempSrc:      Resp:  16 16 16   SpO2: 95% 93% 94% 94%   Blood pressure 145/72, pulse 86, temperature 98.5 F (36.9 C), temperature source Oral, resp. rate 16, SpO2 94.00%.  GEN:  Pleasant  patient lying in the stretcher in no acute distress; cooperative with exam. Very somnolent PSYCH: does not appear anxious or depressed; affect is appropriate. HEENT: Mucous membranes pink and anicteric; PERRLA; EOM intact; no cervical lymphadenopathy nor thyromegaly or carotid bruit; no JVD; There were no stridor. Neck is very supple. Breasts:: Not examined CHEST WALL: No tenderness CHEST: Normal respiration, clear to auscultation bilaterally.  HEART: Regular rate and rhythm.  There are no murmur, rub, or gallops.   BACK: No kyphosis or scoliosis; no CVA tenderness ABDOMEN: soft and non-tender; no masses, no organomegaly, normal abdominal bowel sounds; no pannus; no intertriginous candida. There is no rebound and no distention. Rectal Exam: Not done EXTREMITIES: No bone or joint deformity; age-appropriate arthropathy of the hands and knees; no edema; no ulcerations.  There is no calf tenderness. Left leg shows  erythema and swollen calf.  Right leg has chronic venous changes. Genitalia: not examined PULSES: 2+ and symmetric SKIN: Normal hydration no rash or ulceration CNS: Speech is slurred.  Facial symmetry.  No motor or sensory loss. Labs on Admission:  Basic Metabolic Panel:  Recent Labs Lab 12/12/12 2356  NA 137  K 4.1  CL 104  CO2 24  GLUCOSE 231*  BUN 5*  CREATININE 0.75  CALCIUM 8.5   Liver Function Tests:  Recent Labs Lab 12/12/12 2356  AST 53*  ALT 22  ALKPHOS 160*  BILITOT 1.6*  PROT 6.6  ALBUMIN 2.8*   No results found for this basename: LIPASE, AMYLASE,  in the last 168 hours No results found for this basename: AMMONIA,  in the last 168 hours CBC:  Recent Labs Lab 12/12/12 2356  WBC 2.9*  NEUTROABS 1.7  HGB 10.7*  HCT 32.3*  MCV 91.8  PLT 57*   Cardiac Enzymes: No results found for this basename: CKTOTAL, CKMB, CKMBINDEX, TROPONINI,  in the last 168 hours  CBG: No results found for this basename: GLUCAP,  in the last 168 hours   Radiological Exams on  Admission: Dg Chest 2 View  12/13/2012   *RADIOLOGY REPORT*  Clinical Data: Shortness of breath  CHEST - 2 VIEW  Comparison: Prior radiograph from 06/19/2012  Findings: The cardiac silhouette is stable in size and contour, and remain within normal limits.  The lungs are normally inflated. There is no pulmonary edema.  No pleural effusion. No pneumothorax.  Bony thorax is intact.  IMPRESSION:  No active cardiopulmonary disease.  No change from prior.   Original Report Authenticated By: Rise Mu, M.D.   Ct Angio Chest Pe W/cm &/or Wo Cm  12/13/2012   *RADIOLOGY REPORT*  Clinical Data: Shortness of breath.  Positive D-dimer.  Lower extremity complains for several weeks.  CT ANGIOGRAPHY CHEST  Technique:  Multidetector CT imaging of the chest using the standard protocol during bolus administration of intravenous contrast. Multiplanar reconstructed images including MIPs were obtained and reviewed to  evaluate the vascular anatomy.  Contrast: OMNIPAQUE IOHEXOL 350 MG/ML SOLN  Comparison: None.  Findings: Technically adequate study with moderately good opacification of the central and proximal segmental pulmonary arteries.  More peripheral pulmonary arteries are not well opacified and smaller emboli may be obscured.  However, no filling defects are demonstrated in the opacified central pulmonary arteries suggesting no large pulmonary embolus.  The heart size is normal.  Normal caliber thoracic aorta.  Motion artifact limits visualization of the aorta.  No significant lymphadenopathy in the chest.  The esophagus is gas filled without significant distension.  This may represent dysmotility.  The thyroid gland is homogeneous.  No pleural effusions.  Visualization of the lungs is limited due to respiratory motion artifact.  No gross consolidation or pneumothorax is appreciated.  Visualized portions of the upper abdominal organs demonstrate changes of hepatic cirrhosis with splenic enlargement and upper abdominal ascites.  Multiple varices.  Upper abdominal lymph nodes probably related to cirrhosis and ascites.  Normal alignment of the thoracic vertebrae.  IMPRESSION: No evidence of significant central pulmonary embolus although peripheral vessels are not well opacified and smaller emboli could be obscured.  Upper abdomen demonstrates hepatic cirrhosis with splenic enlargement and multiple varices.  Moderate ascites.   Original Report Authenticated By: Burman Nieves, M.D.    Assessment/Plan Present on Admission:  . Cellulitis of lower leg . Cocaine abuse . Diabetes mellitus . Alcohol abuse . Somnolence . HTN (hypertension) . Bipolar affect, depressed   Pancytopenia  PLAN:  Will admit for diabetic cellulitis.  Will give Zenaida Niece and IV Cipro.  He will need a left lower leg doppler to exclude concomitant DVT.  For his DM, will continue his home insulin dose, and add PRN SSI.  He is intoxicated at this  time, and has high risk for withdrawal, so I have started him on ativan CIWA protocol.  His pancytopenia is most likely bone marrow suppression from alcohol use.  He is stable, full code, and will be admitted to [facility] service.  Thank you for allowing me to take care of this gentleman.  Other plans as per orders.  Code Status: FULL Unk Lightning, MD. [facility] Pager [phone] 7pm to 7am.  12/13/2012, 5:46 AM

## 2012-12-13 NOTE — ED Provider Notes (Signed)
History    CSN: [id number] Arrival date & time 12/12/12  2344  First MD Initiated Contact with Patient 12/13/12 0049     Chief Complaint  Patient presents with  . Leg Swelling   patient is a poor historian and is intoxicated with alcohol.  HPI GERONIMO DILIBERTO is a 57 y.o. male patient has a history of diabetes, neuropathy, hypertension, cirrhosis and alcohol abuse he drinks about 12 pack of beer daily, comes in complaining about bilateral lower extremity pitting edema as well as left leg redness and pain. He says it's been there a couple of weeks but has gotten acutely worse over the last couple of days, he says his family physician [name] refer him to the ER for possible admission. Patient says he is chronically short of breath and hasn't noticed anything acute, denies any hemoptysis, productive cough. Denies any chest pain, abdominal pain, nausea vomiting or diarrhea.  Past Medical History  Diagnosis Date  . Neuropathy   . Diabetes mellitus   . Bipolar affect, depressed   . Hypertension   . Arthritis   . Stroke     Mini stroke about 21yrs ago  . Cirrhosis   . Alcohol abuse    Past Surgical History  Procedure Laterality Date  . Fracture surgery      Leg and arm 30yrs ago  . Esophagogastroduodenoscopy  04/04/2012    Procedure: ESOPHAGOGASTRODUODENOSCOPY (EGD);  Surgeon: Hilarie Fredrickson, MD;  Location: [facility] ENDOSCOPY;  Service: Endoscopy;  Laterality: N/A;  . Eye surgery  8 months ago both eyes   Family History  Problem Relation Age of Onset  . Hypotension Mother    History  Substance Use Topics  . Smoking status: Current Every Day Smoker -- 0.50 packs/day for 30 years    Types: Cigarettes    Last Attempt to Quit: 04/06/2012  . Smokeless tobacco: Never Used     Comment: quit   . Alcohol Use: 7.2 oz/week    12 Cans of beer per week     Comment: last drank 6 cans beer at 0800 today    Review of Systems At least 10pt or greater review of systems completed and are negative  except where specified in the HPI.  Allergies  Review of patient's allergies indicates no known allergies.  Home Medications   Current Outpatient Rx  Name  Route  Sig  Dispense  Refill  . ALPRAZolam (XANAX) 0.5 MG tablet   Oral   Take 1 tablet (0.5 mg total) by mouth every 12 (twelve) hours. For anxiety   60 tablet   0   . cholecalciferol (VITAMIN D) 1000 UNITS tablet   Oral   Take 1,000 Units by mouth at bedtime.          . diclofenac sodium (VOLTAREN) 1 % GEL   Topical   Apply 2 g topically daily.         Marland Kitchen FLUoxetine (PROZAC) 20 MG capsule   Oral   Take 20 mg by mouth daily.         Marland Kitchen HYDROcodone-acetaminophen (NORCO) 10-325 MG per tablet   Oral   Take 1 tablet by mouth every 6 (six) hours as needed for pain.         . Insulin Glargine (LANTUS SOLOSTAR) 100 UNIT/ML SOPN   Subcutaneous   Inject 5-45 Units into the skin 2 (two) times daily. 5 units in the morning and 45 units during the day         .  lisinopril (PRINIVIL,ZESTRIL) 20 MG tablet   Oral   Take 20 mg by mouth daily.         . naproxen (NAPROSYN) 500 MG tablet   Oral   Take 1 tablet (500 mg total) by mouth 2 (two) times daily with a meal.   30 tablet   0   . pantoprazole (PROTONIX) 40 MG tablet   Oral   Take 40 mg by mouth 2 (two) times daily.         Marland Kitchen pyridOXINE (VITAMIN B-6) 100 MG tablet   Oral   Take 100 mg by mouth daily.           BP 155/60  Pulse 92  Temp(Src) 98.5 F (36.9 C) (Oral)  Resp 18  SpO2 98% Physical Exam  Skin:       Nursing notes reviewed.  Electronic medical record reviewed. VITAL SIGNS:   Filed Vitals:   12/12/12 2348  BP: 155/60  Pulse: 92  Temp: 98.5 F (36.9 C)  TempSrc: Oral  Resp: 18  SpO2: 98%   CONSTITUTIONAL: Awake, oriented, intoxicated HENT: Atraumatic, normocephalic, oral mucosa pink and moist, airway patent. Nares patent without drainage. External ears normal. EYES: Conjunctiva clear, EOMI, PERRLA NECK: Trachea midline,  non-tender, supple CARDIOVASCULAR: Normal heart rate, Normal rhythm, No murmurs, rubs, gallops PULMONARY/CHEST: Clear to auscultation, no rhonchi, wheezes, or rales. Symmetrical breath sounds. Non-tender. ABDOMINAL: Non-distended, obese, soft, non-tender - no rebound or guarding.  BS normal. NEUROLOGIC: Non-focal, moving all four extremities, no gross sensory or motor deficits. EXTREMITIES: No clubbing, cyanosis, or 3+ pitting edema, region of erythema, non-raised and TTP to the left lower extremity. SKIN: Warm, Dry, No erythema  ED Course  Procedures (including critical care time) Labs Reviewed  CBC WITH DIFFERENTIAL - Abnormal; Notable for the following:    WBC 2.9 (*)    RBC 3.52 (*)    Hemoglobin 10.7 (*)    HCT 32.3 (*)    RDW 17.4 (*)    Platelets 57 (*)    Monocytes Relative 13 (*)    Eosinophils Relative 7 (*)    All other components within normal limits  COMPREHENSIVE METABOLIC PANEL - Abnormal; Notable for the following:    Glucose, Bld 231 (*)    BUN 5 (*)    Albumin 2.8 (*)    AST 53 (*)    Alkaline Phosphatase 160 (*)    Total Bilirubin 1.6 (*)    All other components within normal limits  PROTIME-INR  D-DIMER, QUANTITATIVE  ETHANOL   Dg Chest 2 View  12/13/2012   *RADIOLOGY REPORT*  Clinical Data: Shortness of breath  CHEST - 2 VIEW  Comparison: Prior radiograph from 06/19/2012  Findings: The cardiac silhouette is stable in size and contour, and remain within normal limits.  The lungs are normally inflated. There is no pulmonary edema.  No pleural effusion. No pneumothorax.  Bony thorax is intact.  IMPRESSION:  No active cardiopulmonary disease.  No change from prior.   Original Report Authenticated By: Rise Mu, M.D.   Ct Angio Chest Pe W/cm &/or Wo Cm  12/13/2012   *RADIOLOGY REPORT*  Clinical Data: Shortness of breath.  Positive D-dimer.  Lower extremity complains for several weeks.  CT ANGIOGRAPHY CHEST  Technique:  Multidetector CT imaging of the  chest using the standard protocol during bolus administration of intravenous contrast. Multiplanar reconstructed images including MIPs were obtained and reviewed to evaluate the vascular anatomy.  Contrast: OMNIPAQUE IOHEXOL 350 MG/ML SOLN  Comparison: None.  Findings: Technically adequate study with moderately good opacification of the central and proximal segmental pulmonary arteries.  More peripheral pulmonary arteries are not well opacified and smaller emboli may be obscured.  However, no filling defects are demonstrated in the opacified central pulmonary arteries suggesting no large pulmonary embolus.  The heart size is normal.  Normal caliber thoracic aorta.  Motion artifact limits visualization of the aorta.  No significant lymphadenopathy in the chest.  The esophagus is gas filled without significant distension.  This may represent dysmotility.  The thyroid gland is homogeneous.  No pleural effusions.  Visualization of the lungs is limited due to respiratory motion artifact.  No gross consolidation or pneumothorax is appreciated.  Visualized portions of the upper abdominal organs demonstrate changes of hepatic cirrhosis with splenic enlargement and upper abdominal ascites.  Multiple varices.  Upper abdominal lymph nodes probably related to cirrhosis and ascites.  Normal alignment of the thoracic vertebrae.  IMPRESSION: No evidence of significant central pulmonary embolus although peripheral vessels are not well opacified and smaller emboli could be obscured.  Upper abdomen demonstrates hepatic cirrhosis with splenic enlargement and multiple varices.  Moderate ascites.   Original Report Authenticated By: Burman Nieves, M.D.   1. Cellulitis of lower leg   2. 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 male presents with lower extremity edema as well as sialitis the left lower extremity. There is concern for DVT, and a d-dimer was obtained -  this is greatly elevated, patient received CT scan of his chest secondary to his other complaints of shortness of breath even though it does seem chronic. CT of the chest is unremarkable for PE however shows multiple other abnormalities including hepatic cirrhosis, splenic enlargement and multiple varices with moderate ascites.  Other labs are as expected with a decreased white blood cell count, decreased platelet count, increased RDW, decreased albumin and elevated bilirubin.  Patient likely has cellulitis of the left lower extremity, based on the patient's white count, history of drinking 12 beers daily, as patient is likely relatively immunocompromised. Given the patient one dose of antibiotics numerous department was that the patient to try hospitalist for further therapy. Discussed with Dr. Conley Rolls for admission. He is admitted stable.  Jones Skene, MD 12/13/12 [phone]

## 2012-12-13 NOTE — ED Notes (Signed)
Pt transported to CT ?

## 2012-12-13 NOTE — Progress Notes (Signed)
Utilization Review Completed.Eleftheria Taborn T7/24/2014  

## 2012-12-13 NOTE — Progress Notes (Signed)
VASCULAR LAB PRELIMINARY  PRELIMINARY  PRELIMINARY  PRELIMINARY  Left lower extremity venous duplex completed.    Preliminary report:  Left:  No evidence of DVT, superficial thrombosis, or Baker's cyst.  Timiko Offutt, RVT 12/13/2012, 10:12 AM

## 2012-12-14 DIAGNOSIS — F3130 Bipolar disorder, current episode depressed, mild or moderate severity, unspecified: Secondary | ICD-10-CM

## 2012-12-14 LAB — GLUCOSE, CAPILLARY
Glucose-Capillary: 132 mg/dL — ABNORMAL HIGH (ref 70–99)
Glucose-Capillary: 153 mg/dL — ABNORMAL HIGH (ref 70–99)

## 2012-12-14 MED ORDER — CLINDAMYCIN HCL 300 MG PO CAPS
300.0000 mg | ORAL_CAPSULE | Freq: Three times a day (TID) | ORAL | Status: DC
  Administered 2012-12-14 – 2012-12-15 (×3): 300 mg via ORAL
  Filled 2012-12-14 (×6): qty 1

## 2012-12-14 NOTE — Care Management Note (Unsigned)
    Page 1 of 1   12/14/2012     4:30:38 PM   CARE MANAGEMENT NOTE 12/14/2012  Patient:  [name], [name]   Account Number:  [id number]  Date Initiated:  12/14/2012  Documentation initiated by:  Codie Hainer  Subjective/Objective Assessment:   PT ADM ON 12/12/12 WITH CELLULITIS, ETOH ABUSE.  PTA, PT LIVES AT HOME WITH GIRLFRIEND AND IS INDEPENDENT.     Action/Plan:   WILL FOLLOW FOR HOME NEEDS AS PT PROGRESSES.  CSW TO SEE FOR ETOH AND SUBSTANCE ABUSE.   Anticipated DC Date:  12/16/2012   Anticipated DC Plan:  HOME/SELF CARE  In-house referral  Clinical Social Worker      DC Planning Services  CM consult      Choice offered to / List presented to:             Status of service:  In process, will continue to follow Medicare Important Message given?   (If response is "NO", the following Medicare IM given date fields will be blank) Date Medicare IM given:   Date Additional Medicare IM given:    Discharge Disposition:    Per UR Regulation:  Reviewed for med. necessity/level of care/duration of stay  If discussed at Long Length of Stay Meetings, dates discussed:    Comments:

## 2012-12-14 NOTE — Progress Notes (Signed)
[facility] PROGRESS NOTE  PHARRELL LEDFORD ZOX:[id number] DOB: 05-Dec-1955 DOA: 12/13/2012 PCP: Lonia Blood, MD  Assessment/Plan: . Cellulitis of lower leg  - On vanc and cipro empirically - Improving - Will transition to oral abx today  - Afebrile  - LE dopplers neg for DVT   . Cocaine abuse  - counsel against use  . Diabetes mellitus  - On SSI with home inusulin  . Alcohol abuse  - CIWA protocol  . HTN (hypertension)  - BP stable  - Cont meds for now  . Bipolar affect, depressed   Pancytopenia  -likely secondary to BM suppression secondary to chronic ETOH abuse   Chronic Pain:  - NCCSR ([place] Controlled Substance Registry) reviewed  - Chronic narcotics prescribed by [name]  - Last alprazolam rx dispensed on 12/04/12, 90 tabs for 30 days  - Last lortab rx dispensed on 11/22/12 for 120 tabs, 30 day supply  - Last UDS on 11/09/12 pos for benzos and cocaine. NO OPIATES NOTED even though per NCCSR, pt should have been taking lortab at that time   Code Status: Full Family Communication: Pt in room (indicate person spoken with, relationship, and if by phone, the number) Disposition Plan: Pending  Procedures:  LE dopplers 12/13/12 - neg for DVT  Antibiotics:  Vanc 12/13/12>>>12/14/12  Cipro 12/13/12>>>12/14/12  HPI/Subjective: No major events. Reports improving cellulitis  Objective: Filed Vitals:   12/13/12 1421 12/13/12 1741 12/13/12 2014 12/14/12 0416  BP: 162/94 144/72 131/69 133/74  Pulse: 96 85 86 85  Temp: 98.6 F (37 C)  98.6 F (37 C) 99 F (37.2 C)  TempSrc: Oral  Oral Oral  Resp: 18  18 18   Height:      Weight:      SpO2: 96%  96% 93%    Intake/Output Summary (Last 24 hours) at 12/14/12 1051 Last data filed at 12/14/12 0900  Gross per 24 hour  Intake   1560 ml  Output    900 ml  Net    660 ml   Filed Weights   12/13/12 0646  Weight: 113.4 kg (250 lb)    Exam:   General:  Awake, in nad  Cardiovascular: regular, s1,  s2  Respiratory: normal resp effort, no wheezing  Abdomen: soft, nondistended  Musculoskeletal: perfused, papular erythematous rash over B shins with confluent bright erythema improving over L LE   Data Reviewed: Basic Metabolic Panel:  Recent Labs Lab 12/12/12 2356 12/13/12 0840  NA 137  --   K 4.1  --   CL 104  --   CO2 24  --   GLUCOSE 231*  --   BUN 5*  --   CREATININE 0.75 0.72  CALCIUM 8.5  --    Liver Function Tests:  Recent Labs Lab 12/12/12 2356  AST 53*  ALT 22  ALKPHOS 160*  BILITOT 1.6*  PROT 6.6  ALBUMIN 2.8*   No results found for this basename: LIPASE, AMYLASE,  in the last 168 hours No results found for this basename: AMMONIA,  in the last 168 hours CBC:  Recent Labs Lab 12/12/12 2356 12/13/12 0840  WBC 2.9* 2.7*  NEUTROABS 1.7  --   HGB 10.7* 10.0*  HCT 32.3* 29.5*  MCV 91.8 90.8  PLT 57* 47*   Cardiac Enzymes: No results found for this basename: CKTOTAL, CKMB, CKMBINDEX, TROPONINI,  in the last 168 hours BNP (last 3 results) No results found for this basename: PROBNP,  in the last 8760 hours CBG:  Recent Labs Lab 12/13/12 0701 12/13/12 1124 12/13/12 1635 12/13/12 2114 12/14/12 0604  GLUCAP 116* 161* 163* 181* 132*    No results found for this or any previous visit (from the past 240 hour(s)).   Studies: Dg Chest 2 View  12/13/2012   *RADIOLOGY REPORT*  Clinical Data: Shortness of breath  CHEST - 2 VIEW  Comparison: Prior radiograph from 06/19/2012  Findings: The cardiac silhouette is stable in size and contour, and remain within normal limits.  The lungs are normally inflated. There is no pulmonary edema.  No pleural effusion. No pneumothorax.  Bony thorax is intact.  IMPRESSION:  No active cardiopulmonary disease.  No change from prior.   Original Report Authenticated By: Rise Mu, M.D.   Ct Angio Chest Pe W/cm &/or Wo Cm  12/13/2012   *RADIOLOGY REPORT*  Clinical Data: Shortness of breath.  Positive D-dimer.   Lower extremity complains for several weeks.  CT ANGIOGRAPHY CHEST  Technique:  Multidetector CT imaging of the chest using the standard protocol during bolus administration of intravenous contrast. Multiplanar reconstructed images including MIPs were obtained and reviewed to evaluate the vascular anatomy.  Contrast: OMNIPAQUE IOHEXOL 350 MG/ML SOLN  Comparison: None.  Findings: Technically adequate study with moderately good opacification of the central and proximal segmental pulmonary arteries.  More peripheral pulmonary arteries are not well opacified and smaller emboli may be obscured.  However, no filling defects are demonstrated in the opacified central pulmonary arteries suggesting no large pulmonary embolus.  The heart size is normal.  Normal caliber thoracic aorta.  Motion artifact limits visualization of the aorta.  No significant lymphadenopathy in the chest.  The esophagus is gas filled without significant distension.  This may represent dysmotility.  The thyroid gland is homogeneous.  No pleural effusions.  Visualization of the lungs is limited due to respiratory motion artifact.  No gross consolidation or pneumothorax is appreciated.  Visualized portions of the upper abdominal organs demonstrate changes of hepatic cirrhosis with splenic enlargement and upper abdominal ascites.  Multiple varices.  Upper abdominal lymph nodes probably related to cirrhosis and ascites.  Normal alignment of the thoracic vertebrae.  IMPRESSION: No evidence of significant central pulmonary embolus although peripheral vessels are not well opacified and smaller emboli could be obscured.  Upper abdomen demonstrates hepatic cirrhosis with splenic enlargement and multiple varices.  Moderate ascites.   Original Report Authenticated By: Burman Nieves, M.D.    Scheduled Meds: . ALPRAZolam  0.5 mg Oral Q12H  . cholecalciferol  1,000 Units Oral QHS  . ciprofloxacin  400 mg Intravenous Q12H  . docusate sodium  100 mg  Oral BID  . FLUoxetine  20 mg Oral Daily  . folic acid  1 mg Oral Daily  . insulin aspart  0-15 Units Subcutaneous TID WC  . insulin aspart  0-5 Units Subcutaneous QHS  . insulin glargine  45 Units Subcutaneous QPC supper  . insulin glargine  5 Units Subcutaneous QPC breakfast  . lisinopril  20 mg Oral Daily  . LORazepam  0-4 mg Intravenous Q6H   Followed by  . [START ON 12/15/2012] LORazepam  0-4 mg Intravenous Q12H  . multivitamin with minerals  1 tablet Oral Daily  . pantoprazole  40 mg Oral BID  . pyridOXINE  100 mg Oral Daily  . thiamine  100 mg Oral Daily   Or  . thiamine  100 mg Intravenous Daily  . vancomycin  1,000 mg Intravenous Q12H   Continuous Infusions: . sodium chloride 100  mL/hr at 12/13/12 0800    Principal Problem:   Cellulitis of lower leg Active Problems:   Alcohol abuse   HTN (hypertension)   Diabetes mellitus   Somnolence   Cocaine abuse   Bipolar affect, depressed    Time spent:    CHIU, STEPHEN K  [facility] Pager [phone]. If 7PM-7AM, please contact night-coverage at [email or web address], password [facility] 12/14/2012, 10:51 AM  LOS: 1 day

## 2012-12-15 DIAGNOSIS — F1410 Cocaine abuse, uncomplicated: Secondary | ICD-10-CM

## 2012-12-15 LAB — CBC WITH DIFFERENTIAL/PLATELET
Basophils Absolute: 0 10*3/uL (ref 0.0–0.1)
Basophils Relative: 1 % (ref 0–1)
Eosinophils Absolute: 0.2 10*3/uL (ref 0.0–0.7)
Eosinophils Relative: 6 % — ABNORMAL HIGH (ref 0–5)
MCH: 30.2 pg (ref 26.0–34.0)
MCV: 91.1 fL (ref 78.0–100.0)
Neutrophils Relative %: 57 % (ref 43–77)
Platelets: 44 10*3/uL — ABNORMAL LOW (ref 150–400)
RDW: 17 % — ABNORMAL HIGH (ref 11.5–15.5)

## 2012-12-15 LAB — COMPREHENSIVE METABOLIC PANEL
ALT: 19 U/L (ref 0–53)
AST: 46 U/L — ABNORMAL HIGH (ref 0–37)
Albumin: 2.5 g/dL — ABNORMAL LOW (ref 3.5–5.2)
Calcium: 8.3 mg/dL — ABNORMAL LOW (ref 8.4–10.5)
GFR calc Af Amer: >90 mL/min (ref >90)
Potassium: 3.5 mEq/L (ref 3.5–5.1)
Sodium: 136 mEq/L (ref 135–145)
Total Protein: 6.2 g/dL (ref 6.0–8.3)

## 2012-12-15 LAB — GLUCOSE, CAPILLARY
Glucose-Capillary: 101 mg/dL — ABNORMAL HIGH (ref 70–99)
Glucose-Capillary: 105 mg/dL — ABNORMAL HIGH (ref 70–99)

## 2012-12-15 MED ORDER — CLINDAMYCIN HCL 300 MG PO CAPS: 300.0000 mg | ORAL_CAPSULE | Freq: Three times a day (TID) | ORAL | Status: DC

## 2012-12-15 NOTE — Discharge Summary (Signed)
Physician Discharge Summary  [name]:[id number] DOB: Jun 25, 1955 DOA: 12/13/2012  PCP: Lonia Blood, MD  Admit date: 12/13/2012 Discharge date: 12/15/2012  Time spent: 30 minutes  Recommendations for Outpatient Follow-up:  1. Follow up with PCP within one week  Discharge Diagnoses:  Principal Problem:   Cellulitis of lower leg Active Problems:   Alcohol abuse   HTN (hypertension)   Diabetes mellitus   Somnolence   Cocaine abuse   Bipolar affect, depressed   Discharge Condition: Improved  Diet recommendation: Diabetic  Filed Weights   12/13/12 0646  Weight: 113.4 kg (250 lb)    History of present illness:  [name] is an 57 y.o. male with polysubstance abuse including cocaine, alcohol, cigarettes, DM, prior CVA, HTN, bipolar illness, alcoholic cirrhosis, presents to the ER intoxicated, with complaint of increasing pain, redness and swelling of his left lower leg. He has basline shortness of breath he said hasn't changed. He denied fever, chills, nausea or vomiting. No pleuritic CP. He has no distant travel or ill contacts. He has hx of thrombocytopenia also. Evaluation in the ER included an elevated D-dimer, with negative CTPA, WBC of 2.9K, with Hb of 10.7 g/DL, platelet count of 81X, normal renal fx tests, and normal INR. His alcohol level is 100 mg/DL. His BS was in the mid 200s range. Hospitalist was asked to admit him for cellultis.  Hospital Course:  . Cellulitis of lower leg  - The patient was empirically started on vanc and cipro  - Cellulitis improved - The patient was transitioned to oral clindamycin with continued improvement  - The patient remained afebrile  - LE dopplers were neg for DVT   . Cocaine abuse  - counselled against use  . Diabetes mellitus  - On SSI with home inusulin  . Alcohol abuse  - CIWA protocol was continued  . HTN (hypertension)  - BP stable  - Cont meds for now  . Bipolar affect, depressed  Pancytopenia  -likely  secondary to BM suppression secondary to chronic ETOH abuse   Chronic Pain:  - NCCSR ([place] Controlled Substance Registry) reviewed  - Chronic narcotics prescribed by [name]  - Last alprazolam rx dispensed on 12/04/12, 90 tabs for 30 days  - Last lortab rx dispensed on 11/22/12 for 120 tabs, 30 day supply  - Last UDS on 11/09/12 pos for benzos and cocaine. NO OPIATES NOTED even though per NCCSR, pt should have been taking lortab at that time  Discharge Exam: Filed Vitals:   12/14/12 1309 12/14/12 1653 12/14/12 1943 12/15/12 0508  BP: 136/73 134/70 146/69 137/62  Pulse: 78 87 88 87  Temp: 97.9 F (36.6 C)  98.5 F (36.9 C) 98.3 F (36.8 C)  TempSrc: Oral  Oral Oral  Resp: 16  18 18   Height:      Weight:      SpO2: 95%  96% 94%    General: Awake, in nad Cardiovascular: regular, s1, s2 Respiratory: normal resp effort, no wheezing Skin: LLE erythema improving  Discharge Instructions     Medication List         ALPRAZolam 0.5 MG tablet  Commonly known as:  XANAX  Take 1 tablet (0.5 mg total) by mouth every 12 (twelve) hours. For anxiety     cholecalciferol 1000 UNITS tablet  Commonly known as:  VITAMIN D  Take 1,000 Units by mouth at bedtime.     clindamycin 300 MG capsule  Commonly known as:  CLEOCIN  Take 1 capsule (  300 mg total) by mouth every 8 (eight) hours.     diclofenac sodium 1 % Gel  Commonly known as:  VOLTAREN  Apply 2 g topically daily.     FLUoxetine 20 MG capsule  Commonly known as:  PROZAC  Take 20 mg by mouth daily.     HYDROcodone-acetaminophen 10-325 MG per tablet  Commonly known as:  NORCO  Take 1 tablet by mouth every 6 (six) hours as needed for pain.     LANTUS SOLOSTAR 100 UNIT/ML Sopn  Generic drug:  Insulin Glargine  Inject 5-45 Units into the skin 2 (two) times daily. 5 units in the morning and 45 units during the day     lisinopril 20 MG tablet  Commonly known as:  PRINIVIL,ZESTRIL  Take 20 mg by mouth daily.     naproxen 500 MG  tablet  Commonly known as:  NAPROSYN  Take 1 tablet (500 mg total) by mouth 2 (two) times daily with a meal.     pantoprazole 40 MG tablet  Commonly known as:  PROTONIX  Take 40 mg by mouth 2 (two) times daily.     pyridOXINE 100 MG tablet  Commonly known as:  VITAMIN B-6  Take 100 mg by mouth daily.       No Known Allergies Follow-up Information   Schedule an appointment as soon as possible for a visit with Lonia Blood, MD.   Contact information:   [address] [place] [address] [phone]        The results of significant diagnostics from this hospitalization (including imaging, microbiology, ancillary and laboratory) are listed below for reference.    Significant Diagnostic Studies: Dg Chest 2 View  12/13/2012   *RADIOLOGY REPORT*  Clinical Data: Shortness of breath  CHEST - 2 VIEW  Comparison: Prior radiograph from 06/19/2012  Findings: The cardiac silhouette is stable in size and contour, and remain within normal limits.  The lungs are normally inflated. There is no pulmonary edema.  No pleural effusion. No pneumothorax.  Bony thorax is intact.  IMPRESSION:  No active cardiopulmonary disease.  No change from prior.   Original Report Authenticated By: Rise Mu, M.D.   Ct Angio Chest Pe W/cm &/or Wo Cm  12/13/2012   *RADIOLOGY REPORT*  Clinical Data: Shortness of breath.  Positive D-dimer.  Lower extremity complains for several weeks.  CT ANGIOGRAPHY CHEST  Technique:  Multidetector CT imaging of the chest using the standard protocol during bolus administration of intravenous contrast. Multiplanar reconstructed images including MIPs were obtained and reviewed to evaluate the vascular anatomy.  Contrast: OMNIPAQUE IOHEXOL 350 MG/ML SOLN  Comparison: None.  Findings: Technically adequate study with moderately good opacification of the central and proximal segmental pulmonary arteries.  More peripheral pulmonary arteries are not well opacified and smaller  emboli may be obscured.  However, no filling defects are demonstrated in the opacified central pulmonary arteries suggesting no large pulmonary embolus.  The heart size is normal.  Normal caliber thoracic aorta.  Motion artifact limits visualization of the aorta.  No significant lymphadenopathy in the chest.  The esophagus is gas filled without significant distension.  This may represent dysmotility.  The thyroid gland is homogeneous.  No pleural effusions.  Visualization of the lungs is limited due to respiratory motion artifact.  No gross consolidation or pneumothorax is appreciated.  Visualized portions of the upper abdominal organs demonstrate changes of hepatic cirrhosis with splenic enlargement and upper abdominal ascites.  Multiple varices.  Upper abdominal lymph  nodes probably related to cirrhosis and ascites.  Normal alignment of the thoracic vertebrae.  IMPRESSION: No evidence of significant central pulmonary embolus although peripheral vessels are not well opacified and smaller emboli could be obscured.  Upper abdomen demonstrates hepatic cirrhosis with splenic enlargement and multiple varices.  Moderate ascites.   Original Report Authenticated By: Burman Nieves, M.D.    Microbiology: No results found for this or any previous visit (from the past 240 hour(s)).   Labs: Basic Metabolic Panel:  Recent Labs Lab 12/12/12 2356 12/13/12 0840 12/15/12 0405  NA 137  --  136  K 4.1  --  3.5  CL 104  --  103  CO2 24  --  24  GLUCOSE 231*  --  91  BUN 5*  --  8  CREATININE 0.75 0.72 0.68  CALCIUM 8.5  --  8.3*   Liver Function Tests:  Recent Labs Lab 12/12/12 2356 12/15/12 0405  AST 53* 46*  ALT 22 19  ALKPHOS 160* 129*  BILITOT 1.6* 1.8*  PROT 6.6 6.2  ALBUMIN 2.8* 2.5*   No results found for this basename: LIPASE, AMYLASE,  in the last 168 hours No results found for this basename: AMMONIA,  in the last 168 hours CBC:  Recent Labs Lab 12/12/12 2356 12/13/12 0840  12/15/12 0405  WBC 2.9* 2.7* 3.0*  NEUTROABS 1.7  --  1.7  HGB 10.7* 10.0* 10.2*  HCT 32.3* 29.5* 30.8*  MCV 91.8 90.8 91.1  PLT 57* 47* 44*   Cardiac Enzymes: No results found for this basename: CKTOTAL, CKMB, CKMBINDEX, TROPONINI,  in the last 168 hours BNP: BNP (last 3 results) No results found for this basename: PROBNP,  in the last 8760 hours CBG:  Recent Labs Lab 12/14/12 1122 12/14/12 1620 12/14/12 2132 12/15/12 0602 12/15/12 0732  GLUCAP 153* 141* 153* 90 101*       Signed:  CHIU, STEPHEN K  [facility] 12/15/2012, 11:24 AM

## 2012-12-15 NOTE — Progress Notes (Signed)
Pt discharge instructions and education completed. IV site d/c. Site WNL. No further questions. Prescription given to patient. D/C home with wife. [name]

## 2012-12-17 ENCOUNTER — Emergency Department (HOSPITAL_COMMUNITY)
Admission: EM | Admit: 2012-12-17 | Discharge: 2012-12-17 | Disposition: A | Discharge status: Home / Self Care | Payer: Medicare PPO | Source: Home / Self Care | Attending: Emergency Medicine | Admitting: Emergency Medicine

## 2012-12-17 ENCOUNTER — Encounter (HOSPITAL_COMMUNITY): Payer: Self-pay | Admitting: *Deleted

## 2012-12-17 DIAGNOSIS — M129 Arthropathy, unspecified: Secondary | ICD-10-CM | POA: Insufficient documentation

## 2012-12-17 DIAGNOSIS — Z8673 Personal history of transient ischemic attack (TIA), and cerebral infarction without residual deficits: Secondary | ICD-10-CM | POA: Insufficient documentation

## 2012-12-17 DIAGNOSIS — Z8719 Personal history of other diseases of the digestive system: Secondary | ICD-10-CM | POA: Insufficient documentation

## 2012-12-17 DIAGNOSIS — Z79899 Other long term (current) drug therapy: Secondary | ICD-10-CM | POA: Insufficient documentation

## 2012-12-17 DIAGNOSIS — F17200 Nicotine dependence, unspecified, uncomplicated: Secondary | ICD-10-CM | POA: Insufficient documentation

## 2012-12-17 DIAGNOSIS — Z8669 Personal history of other diseases of the nervous system and sense organs: Secondary | ICD-10-CM | POA: Insufficient documentation

## 2012-12-17 DIAGNOSIS — L0390 Cellulitis, unspecified: Secondary | ICD-10-CM

## 2012-12-17 DIAGNOSIS — L02419 Cutaneous abscess of limb, unspecified: Secondary | ICD-10-CM | POA: Insufficient documentation

## 2012-12-17 DIAGNOSIS — I10 Essential (primary) hypertension: Secondary | ICD-10-CM | POA: Insufficient documentation

## 2012-12-17 DIAGNOSIS — Z8659 Personal history of other mental and behavioral disorders: Secondary | ICD-10-CM | POA: Insufficient documentation

## 2012-12-17 DIAGNOSIS — E119 Type 2 diabetes mellitus without complications: Secondary | ICD-10-CM | POA: Insufficient documentation

## 2012-12-17 DIAGNOSIS — Z794 Long term (current) use of insulin: Secondary | ICD-10-CM | POA: Insufficient documentation

## 2012-12-17 LAB — POCT I-STAT, CHEM 8
BUN: <3 mg/dL — ABNORMAL LOW (ref 6–23)
Calcium, Ion: 1.14 mmol/L (ref 1.12–1.23)
Chloride: 106 mEq/L (ref 96–112)
Creatinine, Ser: 1.1 mg/dL (ref 0.50–1.35)
Glucose, Bld: 278 mg/dL — ABNORMAL HIGH (ref 70–99)
HCT: 32 % — ABNORMAL LOW (ref 39.0–52.0)
Hemoglobin: 10.9 g/dL — ABNORMAL LOW (ref 13.0–17.0)
Potassium: 3.6 mEq/L (ref 3.5–5.1)
Sodium: 142 mEq/L (ref 135–145)
TCO2: 20 mmol/L (ref 0–100)

## 2012-12-17 LAB — CBC
Hemoglobin: 9.7 g/dL — ABNORMAL LOW (ref 13.0–17.0)
MCHC: 33 g/dL (ref 30.0–36.0)
RBC: 3.23 MIL/uL — ABNORMAL LOW (ref 4.22–5.81)
WBC: 3 10*3/uL — ABNORMAL LOW (ref 4.0–10.5)

## 2012-12-17 MED ORDER — FENTANYL CITRATE 0.05 MG/ML IJ SOLN
50.0000 ug | INTRAMUSCULAR | Status: DC | PRN
  Administered 2012-12-17: 50 ug via INTRAVENOUS
  Filled 2012-12-17: qty 2

## 2012-12-17 MED ORDER — VANCOMYCIN HCL IN DEXTROSE 1-5 GM/200ML-% IV SOLN
1000.0000 mg | Freq: Once | INTRAVENOUS | Status: AC
  Administered 2012-12-17: 1000 mg via INTRAVENOUS
  Filled 2012-12-17: qty 200

## 2012-12-17 MED ORDER — ONDANSETRON HCL 4 MG/2ML IJ SOLN
4.0000 mg | Freq: Once | INTRAMUSCULAR | Status: AC
  Administered 2012-12-17: 4 mg via INTRAVENOUS
  Filled 2012-12-17: qty 2

## 2012-12-17 MED ORDER — SODIUM CHLORIDE 0.9 % IV BOLUS (SEPSIS)
1000.0000 mL | Freq: Once | INTRAVENOUS | Status: AC
  Administered 2012-12-17: 1000 mL via INTRAVENOUS

## 2012-12-17 NOTE — ED Provider Notes (Signed)
CSN: [id number]     Arrival date & time 12/17/12  0456 History     First MD Initiated Contact with Patient 12/17/12 0537     Chief Complaint  Patient presents with  . Leg Pain    bilateral   (Consider location/radiation/quality/duration/timing/severity/associated sxs/prior Treatment) HPI Hx per PT [name] is a 57 y.o. male patient has a history of diabetes, neuropathy, hypertension, cirrhosis and alcohol abuse he drinks about 12 pack of beer daily, comes in complaining about bilateral lower extremity pitting edema as well as left leg redness and pain. He was admitted for cellulitis and discharge yesterday, told to call if he gets worse but decided to just call 911 and come in for persistent severe pain. Cellulitis has not receded from inked outline dated 7/24 on his LLE. No fevers, no emesis. PT unable to tell me what ABx he is on - by review of records is prescribed clindamycin and hydrocodone. PT is quick to tell me about narcotic pain medication and need for something stronger  Past Medical History  Diagnosis Date  . Neuropathy   . Diabetes mellitus   . Bipolar affect, depressed   . Hypertension   . Arthritis   . Stroke     Mini stroke about 78yrs ago  . Cirrhosis   . Alcohol abuse    Past Surgical History  Procedure Laterality Date  . Fracture surgery      Leg and arm 9yrs ago  . Esophagogastroduodenoscopy  04/04/2012    Procedure: ESOPHAGOGASTRODUODENOSCOPY (EGD);  Surgeon: Hilarie Fredrickson, MD;  Location: [facility] ENDOSCOPY;  Service: Endoscopy;  Laterality: N/A;  . Eye surgery  8 months ago both eyes   Family History  Problem Relation Age of Onset  . Hypotension Mother    History  Substance Use Topics  . Smoking status: Current Every Day Smoker -- 0.50 packs/day for 30 years    Types: Cigarettes    Last Attempt to Quit: 04/06/2012  . Smokeless tobacco: Never Used     Comment: quit   . Alcohol Use: 7.2 oz/week    12 Cans of beer per week     Comment: last drank 6  cans beer at 0800 today    Review of Systems  Constitutional: Negative for fever and chills.  HENT: Negative for neck pain and neck stiffness.   Eyes: Negative for pain.  Respiratory: Negative for shortness of breath.   Cardiovascular: Negative for chest pain.  Gastrointestinal: Negative for abdominal pain.  Genitourinary: Negative for flank pain.  Musculoskeletal: Negative for back pain.  Skin: Positive for rash.  Neurological: Negative for headaches.  All other systems reviewed and are negative.    Allergies  Review of patient's allergies indicates no known allergies.  Home Medications   Current Outpatient Rx  Name  Route  Sig  Dispense  Refill  . ALPRAZolam (XANAX) 0.5 MG tablet   Oral   Take 1 tablet (0.5 mg total) by mouth every 12 (twelve) hours. For anxiety   60 tablet   0   . cholecalciferol (VITAMIN D) 1000 UNITS tablet   Oral   Take 1,000 Units by mouth at bedtime.          . clindamycin (CLEOCIN) 300 MG capsule   Oral   Take 1 capsule (300 mg total) by mouth every 8 (eight) hours.   30 capsule   0   . diclofenac sodium (VOLTAREN) 1 % GEL   Topical   Apply 2 g topically  daily.         Marland Kitchen FLUoxetine (PROZAC) 20 MG capsule   Oral   Take 20 mg by mouth daily.         Marland Kitchen HYDROcodone-acetaminophen (NORCO) 10-325 MG per tablet   Oral   Take 1 tablet by mouth every 6 (six) hours as needed for pain.         . Insulin Glargine (LANTUS SOLOSTAR) 100 UNIT/ML SOPN   Subcutaneous   Inject 5-45 Units into the skin 2 (two) times daily. 5 units in the morning and 45 units during the day         . lisinopril (PRINIVIL,ZESTRIL) 20 MG tablet   Oral   Take 20 mg by mouth daily.         . naproxen (NAPROSYN) 500 MG tablet   Oral   Take 1 tablet (500 mg total) by mouth 2 (two) times daily with a meal.   30 tablet   0   . pantoprazole (PROTONIX) 40 MG tablet   Oral   Take 40 mg by mouth 2 (two) times daily.         Marland Kitchen pyridOXINE (VITAMIN B-6) 100  MG tablet   Oral   Take 100 mg by mouth daily.           BP 132/67  Pulse 84  Temp(Src) 97.8 F (36.6 C) (Oral)  Resp 16  SpO2 96% Physical Exam  Constitutional: He is oriented to person, place, and time. He appears well-developed and well-nourished.  HENT:  Head: Normocephalic and atraumatic.  Eyes: EOM are normal. Pupils are equal, round, and reactive to light.  Neck: Neck supple.  Cardiovascular: Normal rate, regular rhythm and intact distal pulses.   Pulmonary/Chest: Effort normal and breath sounds normal. No respiratory distress.  Abdominal: Soft. There is no tenderness.  Musculoskeletal: Normal range of motion.  L.R LE erythema, mild inc warmth to touch and tenderness/ swelling, distal N/V intact. Inked outline in place and no erythema beyond borders  Neurological: He is alert and oriented to person, place, and time.  Skin: Skin is warm and dry.    ED Course   Procedures (including critical care time)  Labs Reviewed  CBC - Abnormal; Notable for the following:    WBC 3.0 (*)    RBC 3.23 (*)    Hemoglobin 9.7 (*)    HCT 29.4 (*)    RDW 17.5 (*)    All other components within normal limits  POCT I-STAT, CHEM 8 - Abnormal; Notable for the following:    BUN <3 (*)    Glucose, Bld 278 (*)    Hemoglobin 10.9 (*)    HCT 32.0 (*)    All other components within normal limits   IVFs and fentanyl provided. PT given an extra dose of IV ABx and plan d/c home, f/u PCP, continue clindamycin. Return precautions provided and verbalized as understood.   MDM  Cellulitis BLEs - no sig findings to suggest need for admit today  Labs  IV narcotics, fluids and Vanc provided  VS, prior records and nurses notes reviewed  Sunnie Nielsen, MD 12/17/12 [phone]

## 2012-12-17 NOTE — ED Notes (Signed)
Per EMS: pt was seen 12/13/12 with neuropathy of both legs, pt had redness bilateral legs (area marked with skin markers not exceeding pre-marked area) pt states bilateral leg pain usual with his neuropathy. Pt CBG 229 per EMS. Pt alert and oriented.

## 2013-01-09 ENCOUNTER — Emergency Department (HOSPITAL_COMMUNITY)
Admission: EM | Admit: 2013-01-09 | Discharge: 2013-01-09 | Disposition: A | Discharge status: Home / Self Care | Payer: Medicare PPO | Attending: Emergency Medicine | Admitting: Emergency Medicine

## 2013-01-09 ENCOUNTER — Encounter (HOSPITAL_COMMUNITY): Payer: Self-pay | Admitting: *Deleted

## 2013-01-09 DIAGNOSIS — F1021 Alcohol dependence, in remission: Secondary | ICD-10-CM | POA: Insufficient documentation

## 2013-01-09 DIAGNOSIS — I10 Essential (primary) hypertension: Secondary | ICD-10-CM | POA: Insufficient documentation

## 2013-01-09 DIAGNOSIS — I776 Arteritis, unspecified: Secondary | ICD-10-CM | POA: Insufficient documentation

## 2013-01-09 DIAGNOSIS — Z794 Long term (current) use of insulin: Secondary | ICD-10-CM | POA: Insufficient documentation

## 2013-01-09 DIAGNOSIS — M129 Arthropathy, unspecified: Secondary | ICD-10-CM | POA: Insufficient documentation

## 2013-01-09 DIAGNOSIS — F17200 Nicotine dependence, unspecified, uncomplicated: Secondary | ICD-10-CM | POA: Insufficient documentation

## 2013-01-09 DIAGNOSIS — Z8673 Personal history of transient ischemic attack (TIA), and cerebral infarction without residual deficits: Secondary | ICD-10-CM | POA: Insufficient documentation

## 2013-01-09 DIAGNOSIS — L02419 Cutaneous abscess of limb, unspecified: Secondary | ICD-10-CM | POA: Insufficient documentation

## 2013-01-09 DIAGNOSIS — Z8669 Personal history of other diseases of the nervous system and sense organs: Secondary | ICD-10-CM | POA: Insufficient documentation

## 2013-01-09 DIAGNOSIS — K7460 Unspecified cirrhosis of liver: Secondary | ICD-10-CM | POA: Insufficient documentation

## 2013-01-09 DIAGNOSIS — Z791 Long term (current) use of non-steroidal anti-inflammatories (NSAID): Secondary | ICD-10-CM | POA: Insufficient documentation

## 2013-01-09 DIAGNOSIS — Z79899 Other long term (current) drug therapy: Secondary | ICD-10-CM | POA: Insufficient documentation

## 2013-01-09 DIAGNOSIS — E119 Type 2 diabetes mellitus without complications: Secondary | ICD-10-CM | POA: Insufficient documentation

## 2013-01-09 LAB — CBC WITH DIFFERENTIAL/PLATELET
Basophils Absolute: 0 10*3/uL (ref 0.0–0.1)
HCT: 29.4 % — ABNORMAL LOW (ref 39.0–52.0)
Lymphocytes Relative: 23 % (ref 12–46)
Lymphs Abs: 0.6 10*3/uL — ABNORMAL LOW (ref 0.7–4.0)
MCV: 88.6 fL (ref 78.0–100.0)
Neutro Abs: 1.5 10*3/uL — ABNORMAL LOW (ref 1.7–7.7)
Platelets: 64 10*3/uL — ABNORMAL LOW (ref 150–400)
RBC: 3.32 MIL/uL — ABNORMAL LOW (ref 4.22–5.81)
RDW: 16.3 % — ABNORMAL HIGH (ref 11.5–15.5)
WBC: 2.6 10*3/uL — ABNORMAL LOW (ref 4.0–10.5)

## 2013-01-09 LAB — COMPREHENSIVE METABOLIC PANEL
ALT: 16 U/L (ref 0–53)
AST: 46 U/L — ABNORMAL HIGH (ref 0–37)
Alkaline Phosphatase: 125 U/L — ABNORMAL HIGH (ref 39–117)
CO2: 26 mEq/L (ref 19–32)
Chloride: 100 mEq/L (ref 96–112)
GFR calc Af Amer: >90 mL/min (ref >90)
GFR calc non Af Amer: >90 mL/min (ref >90)
Glucose, Bld: 312 mg/dL — ABNORMAL HIGH (ref 70–99)
Sodium: 134 mEq/L — ABNORMAL LOW (ref 135–145)
Total Bilirubin: 1.2 mg/dL (ref 0.3–1.2)

## 2013-01-09 MED ORDER — CEPHALEXIN 500 MG PO CAPS: 500.0000 mg | ORAL_CAPSULE | Freq: Four times a day (QID) | ORAL | Status: DC

## 2013-01-09 MED ORDER — DIPHENHYDRAMINE HCL 25 MG PO CAPS
50.0000 mg | ORAL_CAPSULE | Freq: Once | ORAL | Status: AC
  Administered 2013-01-09: 50 mg via ORAL
  Filled 2013-01-09: qty 2

## 2013-01-09 NOTE — ED Provider Notes (Signed)
CSN: [id number]     Arrival date & time 01/09/13  1101 History     First MD Initiated Contact with Patient 01/09/13 1119     Chief Complaint  Patient presents with  . Leg Swelling  . Cellulitis   (Consider location/radiation/quality/duration/timing/severity/associated sxs/prior Treatment) HPI Comments: Patient presents emergency department with chief complaint of bilateral lower extremity swelling, and redness. He was recently seen about a month ago for cellulitis of the lower extremities. He's concerned that this is returned. He is followed by [name]. Patient has tried using Eucerin cream on his lower extremities, was literally. He states that his lower extremities itch. He states that he is noticed increase swelling and redness for the past 2 days. Denies fevers, chills, nausea, vomiting, diarrhea, constipation. Nothing makes his symptoms better or worse.  The history is provided by the patient. No language interpreter was used.    Past Medical History  Diagnosis Date  . Neuropathy   . Diabetes mellitus   . Bipolar affect, depressed   . Hypertension   . Arthritis   . Stroke     Mini stroke about 49yrs ago  . Cirrhosis   . Alcohol abuse    Past Surgical History  Procedure Laterality Date  . Fracture surgery      Leg and arm 35yrs ago  . Esophagogastroduodenoscopy  04/04/2012    Procedure: ESOPHAGOGASTRODUODENOSCOPY (EGD);  Surgeon: Hilarie Fredrickson, MD;  Location: [facility] ENDOSCOPY;  Service: Endoscopy;  Laterality: N/A;  . Eye surgery  8 months ago both eyes   Family History  Problem Relation Age of Onset  . Hypotension Mother    History  Substance Use Topics  . Smoking status: Current Every Day Smoker -- 0.50 packs/day for 30 years    Types: Cigarettes    Last Attempt to Quit: 04/06/2012  . Smokeless tobacco: Never Used     Comment: quit   . Alcohol Use: 7.2 oz/week    12 Cans of beer per week     Comment: last drank 6 cans beer at 0800 today    Review of Systems    All other systems reviewed and are negative.    Allergies  Review of patient's allergies indicates no known allergies.  Home Medications   Current Outpatient Rx  Name  Route  Sig  Dispense  Refill  . ALPRAZolam (XANAX) 0.5 MG tablet   Oral   Take 0.5 mg by mouth 3 (three) times daily as needed. For anxiety         . cholecalciferol (VITAMIN D) 1000 UNITS tablet   Oral   Take 1,000 Units by mouth at bedtime.          . diclofenac sodium (VOLTAREN) 1 % GEL   Topical   Apply 2 g topically daily.         Marland Kitchen FLUoxetine (PROZAC) 20 MG capsule   Oral   Take 20 mg by mouth daily.         Marland Kitchen HYDROcodone-acetaminophen (NORCO) 10-325 MG per tablet   Oral   Take 1 tablet by mouth every 6 (six) hours as needed for pain.         Marland Kitchen insulin glargine (LANTUS) 100 UNIT/ML injection   Subcutaneous   Inject 45 Units into the skin at bedtime.         . Lactulose SOLN   Oral   Take 30 mLs by mouth 2 (two) times daily.         Marland Kitchen  lisinopril (PRINIVIL,ZESTRIL) 20 MG tablet   Oral   Take 20 mg by mouth daily.         . Multiple Vitamin (ONE-A-DAY ESSENTIAL PO)   Oral   Take 1 tablet by mouth daily.         . pantoprazole (PROTONIX) 40 MG tablet   Oral   Take 40 mg by mouth 2 (two) times daily.         Marland Kitchen pyridOXINE (VITAMIN B-6) 100 MG tablet   Oral   Take 100 mg by mouth daily.          . Skin Protectants, Misc. (EUCERIN) cream   Topical   Apply 1 application topically as needed for dry skin.          There were no vitals taken for this visit. Physical Exam  Nursing note and vitals reviewed. Constitutional: He is oriented to person, place, and time. He appears well-developed and well-nourished.  HENT:  Head: Normocephalic and atraumatic.  Right Ear: External ear normal.  Left Ear: External ear normal.  Nose: Nose normal.  Mouth/Throat: Oropharynx is clear and moist. No oropharyngeal exudate.  Eyes: Conjunctivae and EOM are normal. Pupils are equal,  round, and reactive to light. Right eye exhibits no discharge. Left eye exhibits no discharge. No scleral icterus.  Neck: Normal range of motion. Neck supple. No JVD present.  Cardiovascular: Normal rate, regular rhythm, normal heart sounds and intact distal pulses.  Exam reveals no gallop and no friction rub.   No murmur heard. Pulmonary/Chest: Effort normal and breath sounds normal. No respiratory distress. He has no wheezes. He has no rales. He exhibits no tenderness.  Abdominal: Soft. Bowel sounds are normal. He exhibits no distension and no mass. There is no tenderness. There is no rebound and no guarding.  Musculoskeletal: Normal range of motion. He exhibits edema. He exhibits no tenderness.  2+ pitting edema of the bilateral lower extremities  Neurological: He is alert and oriented to person, place, and time.  CN 3-12 intact  Skin: Skin is warm and dry.     Erythema of the lower extremities as marked, no open wounds or sores  Psychiatric: He has a normal mood and affect. His behavior is normal. Judgment and thought content normal.    ED Course   Procedures (including critical care time)  Labs Reviewed  CBC WITH DIFFERENTIAL  COMPREHENSIVE METABOLIC PANEL   Results for orders placed during the hospital encounter of 01/09/13  CBC WITH DIFFERENTIAL      Result Value Range   WBC 2.6 (*) 4.0 - 10.5 K/uL   RBC 3.32 (*) 4.22 - 5.81 MIL/uL   Hemoglobin 10.3 (*) 13.0 - 17.0 g/dL   HCT 65.7 (*) 84.6 - 96.2 %   MCV 88.6  78.0 - 100.0 fL   MCH 31.0  26.0 - 34.0 pg   MCHC 35.0  30.0 - 36.0 g/dL   RDW 95.2 (*) 84.1 - 32.4 %   Platelets 64 (*) 150 - 400 K/uL   Neutrophils Relative % 57  43 - 77 %   Neutro Abs 1.5 (*) 1.7 - 7.7 K/uL   Lymphocytes Relative 23  12 - 46 %   Lymphs Abs 0.6 (*) 0.7 - 4.0 K/uL   Monocytes Relative 14 (*) 3 - 12 %   Monocytes Absolute 0.4  0.1 - 1.0 K/uL   Eosinophils Relative 6 (*) 0 - 5 %   Eosinophils Absolute 0.2  0.0 - 0.7 K/uL  Basophils Relative  0  0 - 1 %   Basophils Absolute 0.0  0.0 - 0.1 K/uL  COMPREHENSIVE METABOLIC PANEL      Result Value Range   Sodium 134 (*) 135 - 145 mEq/L   Potassium 4.7  3.5 - 5.1 mEq/L   Chloride 100  96 - 112 mEq/L   CO2 26  19 - 32 mEq/L   Glucose, Bld 312 (*) 70 - 99 mg/dL   BUN 10  6 - 23 mg/dL   Creatinine, Ser 9.60  0.50 - 1.35 mg/dL   Calcium 8.9  8.4 - 45.4 mg/dL   Total Protein 6.1  6.0 - 8.3 g/dL   Albumin 2.5 (*) 3.5 - 5.2 g/dL   AST 46 (*) 0 - 37 U/L   ALT 16  0 - 53 U/L   Alkaline Phosphatase 125 (*) 39 - 117 U/L   Total Bilirubin 1.2  0.3 - 1.2 mg/dL   GFR calc non Af Amer >90  >90 mL/min   GFR calc Af Amer >90  >90 mL/min      1. Vasculitis     MDM  Patient with bilateral leg swelling and redness. Patient was recently seen for cellulitis of the lower extremities. The redness on his legs look like an allergic reaction to me given their distribution and pattern, I'm going to give the patient Benadryl, but we'll marked with skin marker, and will reevaluate. If no improvement with Benadryl, will consider recurrent cellulitis.  12:39 PM Discussed the patient and labs with Dr. Ranae Palms, who will see the patient.  1:49 PM Dr. Ranae Palms tells me to treat for vasculitis, and recommends keflex in case of underlying cellulitis.  Patient can be discharged to home.  Discussed the plan with the patient.  He understands and agrees with the plan.  Roxy Horseman, PA-C 01/09/13 1358

## 2013-01-09 NOTE — ED Provider Notes (Signed)
Medical screening examination/treatment/procedure(s) were conducted as a shared visit with non-physician practitioner(s) and myself.  I personally evaluated the patient during the encounter Pt with 2 days of bl lower leg rash. No constitutional symptoms. Rash is erythematous and non-blanching. Appears to be vasculitis. Mild LLE erythema. Low suspicion for cellulitis but given co morbidities will start keflex and have pt f/u with PMD. Return precautions given.   Loren Racer, MD 01/09/13 [phone]

## 2013-01-09 NOTE — ED Notes (Signed)
Reports being seen last month for cellulitis right leg but now having increase in redness and swelling to bilateral lower legs x 2 days.

## 2013-01-10 ENCOUNTER — Encounter (HOSPITAL_COMMUNITY): Payer: Self-pay | Admitting: Emergency Medicine

## 2013-01-10 ENCOUNTER — Emergency Department (HOSPITAL_COMMUNITY)
Admission: EM | Admit: 2013-01-10 | Discharge: 2013-01-10 | Disposition: A | Discharge status: Home / Self Care | Payer: Medicare PPO | Attending: Emergency Medicine | Admitting: Emergency Medicine

## 2013-01-10 DIAGNOSIS — L299 Pruritus, unspecified: Secondary | ICD-10-CM | POA: Insufficient documentation

## 2013-01-10 DIAGNOSIS — F1020 Alcohol dependence, uncomplicated: Secondary | ICD-10-CM

## 2013-01-10 DIAGNOSIS — I10 Essential (primary) hypertension: Secondary | ICD-10-CM | POA: Insufficient documentation

## 2013-01-10 DIAGNOSIS — Z8673 Personal history of transient ischemic attack (TIA), and cerebral infarction without residual deficits: Secondary | ICD-10-CM | POA: Insufficient documentation

## 2013-01-10 DIAGNOSIS — R609 Edema, unspecified: Secondary | ICD-10-CM | POA: Insufficient documentation

## 2013-01-10 DIAGNOSIS — F319 Bipolar disorder, unspecified: Secondary | ICD-10-CM

## 2013-01-10 DIAGNOSIS — F1010 Alcohol abuse, uncomplicated: Secondary | ICD-10-CM

## 2013-01-10 DIAGNOSIS — Z8739 Personal history of other diseases of the musculoskeletal system and connective tissue: Secondary | ICD-10-CM | POA: Insufficient documentation

## 2013-01-10 DIAGNOSIS — Z8659 Personal history of other mental and behavioral disorders: Secondary | ICD-10-CM | POA: Insufficient documentation

## 2013-01-10 DIAGNOSIS — L819 Disorder of pigmentation, unspecified: Secondary | ICD-10-CM | POA: Insufficient documentation

## 2013-01-10 DIAGNOSIS — E119 Type 2 diabetes mellitus without complications: Secondary | ICD-10-CM | POA: Insufficient documentation

## 2013-01-10 DIAGNOSIS — Z79899 Other long term (current) drug therapy: Secondary | ICD-10-CM | POA: Insufficient documentation

## 2013-01-10 DIAGNOSIS — F329 Major depressive disorder, single episode, unspecified: Secondary | ICD-10-CM

## 2013-01-10 DIAGNOSIS — F3130 Bipolar disorder, current episode depressed, mild or moderate severity, unspecified: Secondary | ICD-10-CM

## 2013-01-10 DIAGNOSIS — F17200 Nicotine dependence, unspecified, uncomplicated: Secondary | ICD-10-CM | POA: Insufficient documentation

## 2013-01-10 DIAGNOSIS — Z8719 Personal history of other diseases of the digestive system: Secondary | ICD-10-CM | POA: Insufficient documentation

## 2013-01-10 DIAGNOSIS — Z8669 Personal history of other diseases of the nervous system and sense organs: Secondary | ICD-10-CM | POA: Insufficient documentation

## 2013-01-10 LAB — RAPID URINE DRUG SCREEN, HOSP PERFORMED
Amphetamines: NOT DETECTED
Barbiturates: NOT DETECTED
Benzodiazepines: NOT DETECTED
Cocaine: NOT DETECTED

## 2013-01-10 LAB — COMPREHENSIVE METABOLIC PANEL
ALT: 17 U/L (ref 0–53)
Albumin: 2.6 g/dL — ABNORMAL LOW (ref 3.5–5.2)
Calcium: 9 mg/dL (ref 8.4–10.5)
GFR calc Af Amer: >90 mL/min (ref >90)
Glucose, Bld: 254 mg/dL — ABNORMAL HIGH (ref 70–99)
Sodium: 133 mEq/L — ABNORMAL LOW (ref 135–145)
Total Protein: 6.7 g/dL (ref 6.0–8.3)

## 2013-01-10 LAB — CBC
Hemoglobin: 10.3 g/dL — ABNORMAL LOW (ref 13.0–17.0)
MCH: 30.9 pg (ref 26.0–34.0)
MCHC: 34.8 g/dL (ref 30.0–36.0)
Platelets: 68 10*3/uL — ABNORMAL LOW (ref 150–400)
RDW: 16.6 % — ABNORMAL HIGH (ref 11.5–15.5)

## 2013-01-10 MED ORDER — VITAMIN D3 25 MCG (1000 UNIT) PO TABS
1000.0000 [IU] | ORAL_TABLET | Freq: Every day | ORAL | Status: DC
  Filled 2013-01-10: qty 1

## 2013-01-10 MED ORDER — THIAMINE HCL 100 MG/ML IJ SOLN: 100.0000 mg | Freq: Every day | INTRAMUSCULAR | Status: DC

## 2013-01-10 MED ORDER — VITAMIN B-1 100 MG PO TABS
100.0000 mg | ORAL_TABLET | Freq: Every day | ORAL | Status: DC
  Administered 2013-01-10: 100 mg via ORAL
  Filled 2013-01-10: qty 1

## 2013-01-10 MED ORDER — ALUM & MAG HYDROXIDE-SIMETH 200-200-20 MG/5ML PO SUSP: 30.0000 mL | ORAL | Status: DC | PRN

## 2013-01-10 MED ORDER — VITAMIN B-6 100 MG PO TABS
100.0000 mg | ORAL_TABLET | Freq: Every day | ORAL | Status: DC
  Administered 2013-01-10: 100 mg via ORAL
  Filled 2013-01-10 (×2): qty 1

## 2013-01-10 MED ORDER — ALPRAZOLAM 0.25 MG PO TABS
0.5000 mg | ORAL_TABLET | Freq: Three times a day (TID) | ORAL | Status: DC | PRN
  Administered 2013-01-10: 0.5 mg via ORAL
  Filled 2013-01-10 (×2): qty 2

## 2013-01-10 MED ORDER — FLUOXETINE HCL 20 MG PO CAPS
20.0000 mg | ORAL_CAPSULE | Freq: Every day | ORAL | Status: DC
  Administered 2013-01-10: 20 mg via ORAL
  Filled 2013-01-10 (×2): qty 1

## 2013-01-10 MED ORDER — LACTULOSE 10 GM/15ML PO SOLN
30.0000 g | Freq: Two times a day (BID) | ORAL | Status: DC
  Administered 2013-01-10: 30 g via ORAL
  Filled 2013-01-10 (×3): qty 45

## 2013-01-10 MED ORDER — ONDANSETRON HCL 4 MG PO TABS: 4.0000 mg | ORAL_TABLET | Freq: Three times a day (TID) | ORAL | Status: DC | PRN

## 2013-01-10 MED ORDER — ADULT MULTIVITAMIN W/MINERALS CH
1.0000 | ORAL_TABLET | Freq: Every day | ORAL | Status: DC
  Administered 2013-01-10: 1 via ORAL
  Filled 2013-01-10: qty 1

## 2013-01-10 MED ORDER — LORAZEPAM 1 MG PO TABS
0.0000 mg | ORAL_TABLET | Freq: Four times a day (QID) | ORAL | Status: DC
  Administered 2013-01-10: 1 mg via ORAL
  Administered 2013-01-10 (×2): 2 mg via ORAL
  Filled 2013-01-10: qty 1
  Filled 2013-01-10 (×2): qty 2

## 2013-01-10 MED ORDER — ACETAMINOPHEN 500 MG PO TABS
1000.0000 mg | ORAL_TABLET | Freq: Once | ORAL | Status: AC
  Administered 2013-01-10: 1000 mg via ORAL
  Filled 2013-01-10: qty 2

## 2013-01-10 MED ORDER — DICLOFENAC SODIUM 1 % TD GEL
2.0000 g | Freq: Every day | TRANSDERMAL | Status: DC
  Administered 2013-01-10: 2 g via TOPICAL
  Filled 2013-01-10: qty 100

## 2013-01-10 MED ORDER — ONE-A-DAY ESSENTIAL PO TABS: 1.0000 | ORAL_TABLET | Freq: Every day | ORAL | Status: DC

## 2013-01-10 MED ORDER — LACTULOSE SOLN: 30.0000 mL | Freq: Two times a day (BID) | Status: DC

## 2013-01-10 MED ORDER — LORAZEPAM 1 MG PO TABS: 0.0000 mg | ORAL_TABLET | Freq: Two times a day (BID) | ORAL | Status: DC

## 2013-01-10 MED ORDER — PANTOPRAZOLE SODIUM 40 MG PO TBEC
40.0000 mg | DELAYED_RELEASE_TABLET | Freq: Two times a day (BID) | ORAL | Status: DC
  Administered 2013-01-10: 40 mg via ORAL
  Filled 2013-01-10: qty 2

## 2013-01-10 MED ORDER — CEPHALEXIN 250 MG PO CAPS
500.0000 mg | ORAL_CAPSULE | Freq: Four times a day (QID) | ORAL | Status: DC
  Administered 2013-01-10 (×3): 500 mg via ORAL
  Filled 2013-01-10 (×3): qty 2

## 2013-01-10 MED ORDER — INSULIN GLARGINE 100 UNIT/ML ~~LOC~~ SOLN
45.0000 [IU] | Freq: Every day | SUBCUTANEOUS | Status: DC
  Filled 2013-01-10: qty 0.45

## 2013-01-10 MED ORDER — LISINOPRIL 20 MG PO TABS
20.0000 mg | ORAL_TABLET | Freq: Every day | ORAL | Status: DC
  Administered 2013-01-10: 20 mg via ORAL
  Filled 2013-01-10: qty 1

## 2013-01-10 NOTE — ED Notes (Signed)
Pt refusing detox tx, EDP notified.

## 2013-01-10 NOTE — Progress Notes (Signed)
Wynetta Emery, MHT was requested to seek placement for a 57 year old male seeking detox from alcohol. In efforts to facilitate placement writer contacted [facility] who reports that they do not accept medicaid for detox. Contacted [facility] spokw Okey Regal, RN) who reports availability and is willing to review. Referral was submitted [facility] reports back acceptance under condition that patient have three day supply of home medication, insulin along with testing kit and other home medications with exception of narcotics. Writer conferred with patient to see if he could obtain medications from home, advising him that he would need at least three days supply. Writer requested assistance from hospital to supply patient with three days supply of home medications through social work which has been approved. Writer contacted [facility] to confirm that patient will be able to have three day supply and enter treatment. Informed patient of assistance that is being provided and he is willing to enter treatment. Writer will need to contact [employer] to obtain authorization.

## 2013-01-10 NOTE — ED Notes (Signed)
Patient presents to ed with c/o needing help from drinking alcohol states he is currently being seen by MD for pain in both legs of which are marked with skin marker looking a cellulitis states he drinks anywhere from a 6-pack to a case of beer a day. [name] help to stop. States he has been through detox. before

## 2013-01-10 NOTE — ED Notes (Signed)
Pt tearfully reported to this nurse he has a headache and is wanting tylenol.

## 2013-01-10 NOTE — Progress Notes (Signed)
Wynetta Emery, MHT was in process of obtaining authorization through [employer] and was notified by attending physician Dr. Carlena Sax that patient is know declining to enter treatment just as prescriptions for medications were going to be written. Writer spoke with patient inquired of reason for declining treatment and he stated that he did not want leave his brother living in his house and that his spouse was leaving to go to the beach. Writer reviewed with patient all the efforts that had been arranged for him yet he still declines treatment. Writer informed patient that he will be discharged. Writer informed Dr. Carlena Sax that patient has refused treatment and has no other needs that can be addressed while in hospital setting. Patient denies SI/HI, no symptoms of psychosis and is medically clear to be discharged. Writer informed Okey Regal, RN ) at [facility] that patient has declined treatment.

## 2013-01-10 NOTE — ED Provider Notes (Signed)
CSN: [id number]     Arrival date & time 01/10/13  0325 History     First MD Initiated Contact with Patient 01/10/13 [phone]     Chief Complaint  Patient presents with  . Alcohol Problem   (Consider location/radiation/quality/duration/timing/severity/associated sxs/prior Treatment) HPI This is a 57 year old male with a long-standing history of alcohol abuse. He got severely intoxicated this morning and decided that he no longer wanted to be a slave to alcohol. He denies any specific trigger for his drinking. He denies other drug abuse but admits to using cocaine as recently as 2 weeks ago. He has chronic lower extremity edema for which he takes Lasix. He states his lower legs have been itching recently and this has not responded to anything he has placed on his skin. This lower legs also have chronic hyperpigmentation. He denies abdominal pain but admits he has cirrhosis. He denies suicidal or homicidal ideation.  Past Medical History  Diagnosis Date  . Neuropathy   . Diabetes mellitus   . Bipolar affect, depressed   . Hypertension   . Arthritis   . Stroke     Mini stroke about 58yrs ago  . Cirrhosis   . Alcohol abuse    Past Surgical History  Procedure Laterality Date  . Fracture surgery      Leg and arm 79yrs ago  . Esophagogastroduodenoscopy  04/04/2012    Procedure: ESOPHAGOGASTRODUODENOSCOPY (EGD);  Surgeon: Hilarie Fredrickson, MD;  Location: [facility] ENDOSCOPY;  Service: Endoscopy;  Laterality: N/A;  . Eye surgery  8 months ago both eyes   Family History  Problem Relation Age of Onset  . Hypotension Mother    History  Substance Use Topics  . Smoking status: Current Every Day Smoker -- 0.50 packs/day for 30 years    Types: Cigarettes    Last Attempt to Quit: 04/06/2012  . Smokeless tobacco: Never Used     Comment: quit   . Alcohol Use: 0.0 oz/week     Comment: ALCOHOL ABUSE    Review of Systems  All other systems reviewed and are negative.    Allergies  Review of patient's  allergies indicates no known allergies.  Home Medications   Current Outpatient Rx  Name  Route  Sig  Dispense  Refill  . ALPRAZolam (XANAX) 0.5 MG tablet   Oral   Take 0.5 mg by mouth 3 (three) times daily as needed. For anxiety         . cephALEXin (KEFLEX) 500 MG capsule   Oral   Take 1 capsule (500 mg total) by mouth 4 (four) times daily.   40 capsule   0   . cholecalciferol (VITAMIN D) 1000 UNITS tablet   Oral   Take 1,000 Units by mouth at bedtime.          . diclofenac sodium (VOLTAREN) 1 % GEL   Topical   Apply 2 g topically daily. Apply to back and legs.         Marland Kitchen FLUoxetine (PROZAC) 20 MG capsule   Oral   Take 20 mg by mouth daily.         Marland Kitchen HYDROcodone-acetaminophen (NORCO) 10-325 MG per tablet   Oral   Take 1 tablet by mouth every 6 (six) hours as needed for pain.         Marland Kitchen insulin glargine (LANTUS) 100 UNIT/ML injection   Subcutaneous   Inject 45 Units into the skin at bedtime.         . Lactulose  SOLN   Oral   Take 30 mLs by mouth 2 (two) times daily.         Marland Kitchen lisinopril (PRINIVIL,ZESTRIL) 20 MG tablet   Oral   Take 20 mg by mouth daily.         . Multiple Vitamin (ONE-A-DAY ESSENTIAL PO)   Oral   Take 1 tablet by mouth daily.         . pantoprazole (PROTONIX) 40 MG tablet   Oral   Take 40 mg by mouth 2 (two) times daily.         Marland Kitchen pyridOXINE (VITAMIN B-6) 100 MG tablet   Oral   Take 100 mg by mouth daily.          . Skin Protectants, Misc. (EUCERIN) cream   Topical   Apply 1 application topically as needed for dry skin (apply to legs).           BP 151/65  Temp(Src) 97.9 F (36.6 C) (Oral)  Resp 18  SpO2 96%  Physical Exam General: Well-developed, well-nourished male in no acute distress; appearance consistent with age of record HENT: normocephalic; atraumatic; breath smells of alcohol Eyes: pupils equal, round and reactive to light; extraocular muscles intact Neck: supple Heart: regular rate and  rhythm Lungs: clear to auscultation bilaterally Abdomen: soft; mildly distended; nontender; hepatomegaly; bowel sounds present Extremities: No deformity; full range of motion; +2 edema of the lower legs Neurologic: Awake, alert and oriented; motor function intact in all extremities and symmetric; no facial droop; dysarthria; ataxia Skin: Warm and dry; chronic appearing hyperpigmentation of the lower legs; patchy, superficial erythema of the lower legs without associated tenderness or warmth  Psychiatric: No SI or HI    ED Course   Procedures (including critical care time)    MDM   Nursing notes and vitals signs, including pulse oximetry, reviewed.  Summary of this visit's results, reviewed by myself:  Labs:  Results for orders placed during the hospital encounter of 01/10/13 (from the past 24 hour(s))  URINE RAPID DRUG SCREEN (HOSP PERFORMED)     Status: None   Collection Time    01/10/13  3:40 AM      Result Value Range   Opiates NONE DETECTED  NONE DETECTED   Cocaine NONE DETECTED  NONE DETECTED   Benzodiazepines NONE DETECTED  NONE DETECTED   Amphetamines NONE DETECTED  NONE DETECTED   Tetrahydrocannabinol NONE DETECTED  NONE DETECTED   Barbiturates NONE DETECTED  NONE DETECTED  CBC     Status: Abnormal   Collection Time    01/10/13  4:01 AM      Result Value Range   WBC 3.9 (*) 4.0 - 10.5 K/uL   RBC 3.33 (*) 4.22 - 5.81 MIL/uL   Hemoglobin 10.3 (*) 13.0 - 17.0 g/dL   HCT 16.1 (*) 09.6 - 04.5 %   MCV 88.9  78.0 - 100.0 fL   MCH 30.9  26.0 - 34.0 pg   MCHC 34.8  30.0 - 36.0 g/dL   RDW 40.9 (*) 81.1 - 91.4 %   Platelets 68 (*) 150 - 400 K/uL  COMPREHENSIVE METABOLIC PANEL     Status: Abnormal   Collection Time    01/10/13  4:01 AM      Result Value Range   Sodium 133 (*) 135 - 145 mEq/L   Potassium 4.0  3.5 - 5.1 mEq/L   Chloride 99  96 - 112 mEq/L   CO2 25  19 - 32 mEq/L  Glucose, Bld 254 (*) 70 - 99 mg/dL   BUN 8  6 - 23 mg/dL   Creatinine, Ser 1.61  0.50  - 1.35 mg/dL   Calcium 9.0  8.4 - 09.6 mg/dL   Total Protein 6.7  6.0 - 8.3 g/dL   Albumin 2.6 (*) 3.5 - 5.2 g/dL   AST 48 (*) 0 - 37 U/L   ALT 17  0 - 53 U/L   Alkaline Phosphatase 128 (*) 39 - 117 U/L   Total Bilirubin 1.1  0.3 - 1.2 mg/dL   GFR calc non Af Amer >90  >90 mL/min   GFR calc Af Amer >90  >90 mL/min  ETHANOL     Status: Abnormal   Collection Time    01/10/13  4:01 AM      Result Value Range   Alcohol, Ethyl (B) 153 (*) 0 - 11 mg/dL   0:45 AM The erythematous patches on the patient's legs are not consistent with cellulitis. They appear to be superficial and are of the sites of the patient's itching. He was seen in the ED yesterday for this complaint was diagnosed with vasculitis. He was discharged home on Benadryl and Keflex.   Hanley Seamen, MD 01/10/13 [phone]

## 2013-01-10 NOTE — ED Notes (Signed)
Patient ambulated to restroom. Patient tolerated well.

## 2013-01-10 NOTE — ED Notes (Signed)
PT. ARRIVED WITH [facility] FROM HOME REQUESTING DETOX FOR ALCOHOL ABUSE LAST ETOH INTAKE THIS MORNING , AMBULATORY , RESPIRATIONS UNLABORED , ALERT AND ORIENTED , DENIES SUICIDAL IDEATION .

## 2013-01-10 NOTE — Consult Note (Signed)
Reason for Consult:  Alcohol detox/dependency Referring Physician: Tele-psych  [name] is an 57 y.o. male.  HPI:   Patient has been drinking a six pack to case daily for the past two months; had been sober since 03/2011 prior to this relapse.  Denies drug use, U/A negative.  Denies suicidal/homicidal ideations and hallucinations.  He is currently on the Ativan detox protocol and would like the Librium protocol instead.  Mr. Damron stated he has bipolar and has been on Lithium and Celexa in the past but stopped taking it about six months ago because "I was playing God with my medicines."  He did state the medications were effective and request alcohol detox.  See plan below, appropriate for admission once he is medically stable.  Past Medical History  Diagnosis Date  . Neuropathy   . Diabetes mellitus   . Bipolar affect, depressed   . Hypertension   . Arthritis   . Stroke     Mini stroke about 38yrs ago  . Cirrhosis   . Alcohol abuse     Past Surgical History  Procedure Laterality Date  . Fracture surgery      Leg and arm 85yrs ago  . Esophagogastroduodenoscopy  04/04/2012    Procedure: ESOPHAGOGASTRODUODENOSCOPY (EGD);  Surgeon: Hilarie Fredrickson, MD;  Location: [facility] ENDOSCOPY;  Service: Endoscopy;  Laterality: N/A;  . Eye surgery  8 months ago both eyes    Family History  Problem Relation Age of Onset  . Hypotension Mother     Social History:  reports that he has been smoking Cigarettes.  He has a 15 pack-year smoking history. He has never used smokeless tobacco. He reports that  drinks alcohol. He reports that he does not use illicit drugs.  Allergies: No Known Allergies  Medications: I have reviewed the patient's current medications.  Results for orders placed during the hospital encounter of 01/10/13 (from the past 48 hour(s))  URINE RAPID DRUG SCREEN (HOSP PERFORMED)     Status: None   Collection Time    01/10/13  3:40 AM      Result Value Range   Opiates NONE DETECTED   NONE DETECTED   Cocaine NONE DETECTED  NONE DETECTED   Benzodiazepines NONE DETECTED  NONE DETECTED   Amphetamines NONE DETECTED  NONE DETECTED   Tetrahydrocannabinol NONE DETECTED  NONE DETECTED   Barbiturates NONE DETECTED  NONE DETECTED   Comment:            DRUG SCREEN FOR MEDICAL PURPOSES     ONLY.  IF CONFIRMATION IS NEEDED     FOR ANY PURPOSE, NOTIFY LAB     WITHIN 5 DAYS.                LOWEST DETECTABLE LIMITS     FOR URINE DRUG SCREEN     Drug Class       Cutoff (ng/mL)     Amphetamine      1000     Barbiturate      200     Benzodiazepine   200     Tricyclics       300     Opiates          300     Cocaine          300     THC              50  CBC     Status: Abnormal   Collection Time  01/10/13  4:01 AM      Result Value Range   WBC 3.9 (*) 4.0 - 10.5 K/uL   RBC 3.33 (*) 4.22 - 5.81 MIL/uL   Hemoglobin 10.3 (*) 13.0 - 17.0 g/dL   HCT 40.9 (*) 81.1 - 91.4 %   MCV 88.9  78.0 - 100.0 fL   MCH 30.9  26.0 - 34.0 pg   MCHC 34.8  30.0 - 36.0 g/dL   RDW 78.2 (*) 95.6 - 21.3 %   Platelets 68 (*) 150 - 400 K/uL   Comment: CONSISTENT WITH PREVIOUS RESULT  COMPREHENSIVE METABOLIC PANEL     Status: Abnormal   Collection Time    01/10/13  4:01 AM      Result Value Range   Sodium 133 (*) 135 - 145 mEq/L   Potassium 4.0  3.5 - 5.1 mEq/L   Chloride 99  96 - 112 mEq/L   CO2 25  19 - 32 mEq/L   Glucose, Bld 254 (*) 70 - 99 mg/dL   BUN 8  6 - 23 mg/dL   Creatinine, Ser 0.86  0.50 - 1.35 mg/dL   Calcium 9.0  8.4 - 57.8 mg/dL   Total Protein 6.7  6.0 - 8.3 g/dL   Albumin 2.6 (*) 3.5 - 5.2 g/dL   AST 48 (*) 0 - 37 U/L   ALT 17  0 - 53 U/L   Alkaline Phosphatase 128 (*) 39 - 117 U/L   Total Bilirubin 1.1  0.3 - 1.2 mg/dL   GFR calc non Af Amer >90  >90 mL/min   GFR calc Af Amer >90  >90 mL/min   Comment: (NOTE)     The eGFR has been calculated using the CKD EPI equation.     This calculation has not been validated in all clinical situations.     eGFR's persistently <90  mL/min signify possible Chronic Kidney     Disease.  ETHANOL     Status: Abnormal   Collection Time    01/10/13  4:01 AM      Result Value Range   Alcohol, Ethyl (B) 153 (*) 0 - 11 mg/dL   Comment:            LOWEST DETECTABLE LIMIT FOR     SERUM ALCOHOL IS 11 mg/dL     FOR MEDICAL PURPOSES ONLY    No results found.  ROS Blood pressure 139/61, pulse 85, temperature 97.9 F (36.6 C), temperature source Oral, resp. rate 18, SpO2 96.00%. Physical Exam Completed by primary MD, reviewed  Family History:  No family history.  Assessment/Plan:   Mental Status Examination/Evaluation: Patient is poorly groomed, malodorous, and disheveled. He is calm and cooperative today, denies any active or passive suicidal thoughts or homicidal thoughts.  His thoughts are organized at times but responds to many of the questions with "I don't know" or does not remember, forward little.  There is no paranoia or delusions present at this time. He denies any auditory or visual hallucination. His attention and concentration is poor.  His insight and judgment are fair, impulse control intact.  DIAGNOSIS:  AXIS I  Alcohol dependency/detox, bipolar disorder   AXIS II  Deferred   AXIS III  Multiple medical issues, see above  AXIS IV  other psychosocial or environmental problems, chronic mental and physical issues, problems with primary support group   AXIS V  Severe symptoms    Assessment/Plan:  Recommend Librium protocol versus the Ativan protocol per patient's request.  After medically stabilization, transfer to [facility] when a bed is available for alcohol detox. Contact Child psychotherapist for outpatient discharge planning.  Nanine Means, PMH-NP 01/10/2013, 11:03 AM

## 2013-01-10 NOTE — Consult Note (Signed)
Agree with the plan.

## 2013-01-10 NOTE — ED Provider Notes (Signed)
57 year old male here for detox. No suicidal or homicidal ideations. Our social workers were able to get him accepted into [facility] for detoxification. Want to speak to the patient, and he states he's not going to order that he wants to go to his brother's house and talked to his wife. Patient is not suicidal or homicidal this time. He declines detox at this time due to personal conflict. Stable for discharge.  1. Alcoholism   2. Alcohol abuse   3. Bipolar affect, depressed   4. Depression      Dagmar Hait, MD 01/10/13 2025

## 2013-01-10 NOTE — ED Notes (Signed)
Toiletries provided to pt for ADLs. Pt is independent. Pt stated he would shower later.

## 2013-01-16 ENCOUNTER — Emergency Department (HOSPITAL_COMMUNITY)
Admission: EM | Admit: 2013-01-16 | Discharge: 2013-01-17 | Disposition: A | Discharge status: Home / Self Care | Payer: Medicare PPO | Attending: Emergency Medicine | Admitting: Emergency Medicine

## 2013-01-16 ENCOUNTER — Encounter (HOSPITAL_COMMUNITY): Payer: Self-pay | Admitting: *Deleted

## 2013-01-16 DIAGNOSIS — Z792 Long term (current) use of antibiotics: Secondary | ICD-10-CM | POA: Insufficient documentation

## 2013-01-16 DIAGNOSIS — I10 Essential (primary) hypertension: Secondary | ICD-10-CM | POA: Insufficient documentation

## 2013-01-16 DIAGNOSIS — F10929 Alcohol use, unspecified with intoxication, unspecified: Secondary | ICD-10-CM

## 2013-01-16 DIAGNOSIS — Z8669 Personal history of other diseases of the nervous system and sense organs: Secondary | ICD-10-CM | POA: Insufficient documentation

## 2013-01-16 DIAGNOSIS — IMO0002 Reserved for concepts with insufficient information to code with codable children: Secondary | ICD-10-CM | POA: Insufficient documentation

## 2013-01-16 DIAGNOSIS — S298XXA Other specified injuries of thorax, initial encounter: Secondary | ICD-10-CM | POA: Insufficient documentation

## 2013-01-16 DIAGNOSIS — Y929 Unspecified place or not applicable: Secondary | ICD-10-CM | POA: Insufficient documentation

## 2013-01-16 DIAGNOSIS — F17200 Nicotine dependence, unspecified, uncomplicated: Secondary | ICD-10-CM | POA: Insufficient documentation

## 2013-01-16 DIAGNOSIS — Z8739 Personal history of other diseases of the musculoskeletal system and connective tissue: Secondary | ICD-10-CM | POA: Insufficient documentation

## 2013-01-16 DIAGNOSIS — Y939 Activity, unspecified: Secondary | ICD-10-CM | POA: Insufficient documentation

## 2013-01-16 DIAGNOSIS — Z8673 Personal history of transient ischemic attack (TIA), and cerebral infarction without residual deficits: Secondary | ICD-10-CM | POA: Insufficient documentation

## 2013-01-16 DIAGNOSIS — E119 Type 2 diabetes mellitus without complications: Secondary | ICD-10-CM | POA: Insufficient documentation

## 2013-01-16 DIAGNOSIS — F3130 Bipolar disorder, current episode depressed, mild or moderate severity, unspecified: Secondary | ICD-10-CM | POA: Insufficient documentation

## 2013-01-16 DIAGNOSIS — Z8719 Personal history of other diseases of the digestive system: Secondary | ICD-10-CM | POA: Insufficient documentation

## 2013-01-16 DIAGNOSIS — W19XXXA Unspecified fall, initial encounter: Secondary | ICD-10-CM | POA: Insufficient documentation

## 2013-01-16 DIAGNOSIS — F1010 Alcohol abuse, uncomplicated: Secondary | ICD-10-CM | POA: Insufficient documentation

## 2013-01-16 DIAGNOSIS — Z79899 Other long term (current) drug therapy: Secondary | ICD-10-CM | POA: Insufficient documentation

## 2013-01-16 DIAGNOSIS — Z794 Long term (current) use of insulin: Secondary | ICD-10-CM | POA: Insufficient documentation

## 2013-01-16 DIAGNOSIS — M533 Sacrococcygeal disorders, not elsewhere classified: Secondary | ICD-10-CM

## 2013-01-16 NOTE — ED Notes (Signed)
Bed: [facility] Expected date:  Expected time:  Means of arrival:  Comments: EMS 57yo M; left rib pain, ETOH

## 2013-01-16 NOTE — ED Notes (Signed)
Pt reports that he fell 3 days ago and is having left rib pain since fall; pt also been drinking this pm, pt reports consumed approx a case of beer today

## 2013-01-17 ENCOUNTER — Emergency Department (HOSPITAL_COMMUNITY): Payer: Medicare PPO

## 2013-01-17 LAB — COMPREHENSIVE METABOLIC PANEL
Alkaline Phosphatase: 130 U/L — ABNORMAL HIGH (ref 39–117)
BUN: 9 mg/dL (ref 6–23)
Chloride: 102 mEq/L (ref 96–112)
Creatinine, Ser: 0.59 mg/dL (ref 0.50–1.35)
GFR calc Af Amer: >90 mL/min (ref >90)
GFR calc non Af Amer: >90 mL/min (ref >90)
Glucose, Bld: 174 mg/dL — ABNORMAL HIGH (ref 70–99)
Potassium: 3.7 mEq/L (ref 3.5–5.1)
Total Bilirubin: 1.7 mg/dL — ABNORMAL HIGH (ref 0.3–1.2)

## 2013-01-17 LAB — CBC
HCT: 32.1 % — ABNORMAL LOW (ref 39.0–52.0)
MCH: 29.8 pg (ref 26.0–34.0)
MCV: 88.4 fL (ref 78.0–100.0)
RBC: 3.63 MIL/uL — ABNORMAL LOW (ref 4.22–5.81)
WBC: 3.6 10*3/uL — ABNORMAL LOW (ref 4.0–10.5)

## 2013-01-17 LAB — TROPONIN I: Troponin I: <0.3 ng/mL (ref <0.30)

## 2013-01-17 NOTE — ED Provider Notes (Signed)
CSN: [id number]     Arrival date & time 01/16/13  2334 History   First MD Initiated Contact with Patient 01/16/13 2340     Chief Complaint  Patient presents with  . Chest Pain  . Alcohol Intoxication    The history is provided by the patient.   patient is a history of alcohol abuse and presents the emergency department after alcohol intoxication today.  He states he's been falling more frequently and reports pain in his coccyx and low back to me.  He denies chest pain shortness of breath.  Denies abdominal pain.  He has no nausea vomiting or diarrhea.  He is not seeking assistance with detox at this time.  He has no homicidal or suicidal thoughts.  It is noted in the nursing note and he is reporting some left-sided rib pain.  On examination the patient is not tender here when I asked specifically about any left-sided rib pain he denies any tenderness or pain.  He does report drinking a case of beer today.  No other complaints.  No headache.  No use of anticoagulants. Past Medical History  Diagnosis Date  . Neuropathy   . Diabetes mellitus   . Bipolar affect, depressed   . Hypertension   . Arthritis   . Stroke     Mini stroke about 84yrs ago  . Cirrhosis   . Alcohol abuse    Past Surgical History  Procedure Laterality Date  . Fracture surgery      Leg and arm 44yrs ago  . Esophagogastroduodenoscopy  04/04/2012    Procedure: ESOPHAGOGASTRODUODENOSCOPY (EGD);  Surgeon: Hilarie Fredrickson, MD;  Location: [facility] ENDOSCOPY;  Service: Endoscopy;  Laterality: N/A;  . Eye surgery  8 months ago both eyes   Family History  Problem Relation Age of Onset  . Hypotension Mother    History  Substance Use Topics  . Smoking status: Current Every Day Smoker -- 0.50 packs/day for 30 years    Types: Cigarettes    Last Attempt to Quit: 04/06/2012  . Smokeless tobacco: Never Used     Comment: quit   . Alcohol Use: 0.0 oz/week     Comment: ALCOHOL ABUSE    Review of Systems  Cardiovascular: Positive for  chest pain.  All other systems reviewed and are negative.    Allergies  Review of patient's allergies indicates no known allergies.  Home Medications   Current Outpatient Rx  Name  Route  Sig  Dispense  Refill  . ALPRAZolam (XANAX) 0.5 MG tablet   Oral   Take 0.5 mg by mouth 3 (three) times daily as needed. For anxiety         . cephALEXin (KEFLEX) 500 MG capsule   Oral   Take 1 capsule (500 mg total) by mouth 4 (four) times daily.   40 capsule   0   . cholecalciferol (VITAMIN D) 1000 UNITS tablet   Oral   Take 1,000 Units by mouth at bedtime.          . diclofenac sodium (VOLTAREN) 1 % GEL   Topical   Apply 2 g topically daily. Apply to back and legs.         Marland Kitchen HYDROcodone-acetaminophen (NORCO) 10-325 MG per tablet   Oral   Take 1 tablet by mouth every 6 (six) hours as needed for pain.         Marland Kitchen insulin glargine (LANTUS) 100 UNIT/ML injection   Subcutaneous   Inject 45 Units into the  skin at bedtime.         Marland Kitchen lisinopril (PRINIVIL,ZESTRIL) 20 MG tablet   Oral   Take 20 mg by mouth daily.         . Multiple Vitamin (ONE-A-DAY ESSENTIAL PO)   Oral   Take 1 tablet by mouth daily.         . pantoprazole (PROTONIX) 40 MG tablet   Oral   Take 40 mg by mouth 2 (two) times daily.         Marland Kitchen pyridOXINE (VITAMIN B-6) 100 MG tablet   Oral   Take 100 mg by mouth daily.          . Skin Protectants, Misc. (EUCERIN) cream   Topical   Apply 1 application topically as needed for dry skin (apply to legs).           BP 103/48  Pulse 74  Temp(Src) 98 F (36.7 C) (Oral)  Resp 18  SpO2 93% Physical Exam  Nursing note and vitals reviewed. Constitutional: He is oriented to person, place, and time. He appears well-developed and well-nourished.  HENT:  Head: Normocephalic and atraumatic.  Eyes: EOM are normal.  Neck: Normal range of motion.  Cardiovascular: Normal rate, regular rhythm, normal heart sounds and intact distal pulses.   Pulmonary/Chest:  Effort normal and breath sounds normal. No respiratory distress.  Abdominal: Soft. He exhibits no distension. There is no tenderness.  Genitourinary: Rectum normal.  Musculoskeletal: Normal range of motion.  Mild L-spine and sacral tenderness.  No obvious step-offs.  5 out of 5 strength in bilateral lower extremity major muscle groups.  Neurological: He is alert and oriented to person, place, and time.  Skin: Skin is warm and dry.  Psychiatric: He has a normal mood and affect. Judgment normal.    ED Course  Procedures (including critical care time) Labs Review Labs Reviewed  CBC - Abnormal; Notable for the following:    WBC 3.6 (*)    RBC 3.63 (*)    Hemoglobin 10.8 (*)    HCT 32.1 (*)    RDW 16.1 (*)    All other components within normal limits  ETHANOL - Abnormal; Notable for the following:    Alcohol, Ethyl (B) 180 (*)    All other components within normal limits  COMPREHENSIVE METABOLIC PANEL - Abnormal; Notable for the following:    Sodium 134 (*)    Glucose, Bld 174 (*)    Albumin 2.6 (*)    AST 76 (*)    Alkaline Phosphatase 130 (*)    Total Bilirubin 1.7 (*)    All other components within normal limits  TROPONIN I   Imaging Review Dg Lumbar Spine Complete  01/17/2013   *RADIOLOGY REPORT*  Clinical Data: Back pain status post fall  LUMBAR SPINE - COMPLETE 4+ VIEW  Comparison: None.  Findings: Minimal wedging of the lower thoracic (T11 and T12) vertebral bodies.  The imaged lumbar vertebral bodies and inter- vertebral disc spaces are maintained. No displaced acute fracture or dislocation identified. Atherosclerotic vascular calcifications. Otherwise, the para-vertebral and overlying soft tissues are within normal limits.  Mild lower thoracic/upper lumbar degenerative changes.  IMPRESSION: Mild degenerative changes.  Minimal wedging of the lower thoracic vertebral bodies, not favored to be acute however correlate for point tenderness.   Original Report Authenticated By:  Jearld Lesch, M.D.   Dg Sacrum/coccyx  01/17/2013   *RADIOLOGY REPORT*  Clinical Data: Tail bone pain status post fall.  SACRUM AND COCCYX -  2+ VIEW  Comparison: None.  Findings: Sacrum is partially obscured by overlying bowel.  Within this limitation, no displaced sacral fracture identified.  SI joints intact. There is mild posterior step off of the lower coccygeal segment.  IMPRESSION: Mild step off of the lower segment of the coccyx, may reflect an acute fracture given the stated history.   Original Report Authenticated By: Jearld Lesch, M.D.   I personally reviewed the imaging tests through PACS system I reviewed available ER/hospitalization records through the EMR   MDM   1. Alcohol intoxication   2. Coccygeal pain    Patient is overall well-appearing.  1:37 AM Possible coccygeal injury.  Vital signs are normal.  Discharge home in good condition.  Patient's family later on emergency department without any difficulty.  His frequent falls are due to alcohol intoxication.  Doubt stroke.  Lyanne Co, MD 01/17/13 [phone]

## 2013-01-22 ENCOUNTER — Emergency Department (HOSPITAL_COMMUNITY)
Admission: EM | Admit: 2013-01-22 | Discharge: 2013-01-22 | Disposition: A | Discharge status: Home / Self Care | Payer: Medicare PPO | Attending: Emergency Medicine | Admitting: Emergency Medicine

## 2013-01-22 ENCOUNTER — Encounter (HOSPITAL_COMMUNITY): Payer: Self-pay | Admitting: Family Medicine

## 2013-01-22 DIAGNOSIS — Z791 Long term (current) use of non-steroidal anti-inflammatories (NSAID): Secondary | ICD-10-CM | POA: Insufficient documentation

## 2013-01-22 DIAGNOSIS — I10 Essential (primary) hypertension: Secondary | ICD-10-CM | POA: Insufficient documentation

## 2013-01-22 DIAGNOSIS — M129 Arthropathy, unspecified: Secondary | ICD-10-CM | POA: Insufficient documentation

## 2013-01-22 DIAGNOSIS — Z792 Long term (current) use of antibiotics: Secondary | ICD-10-CM | POA: Insufficient documentation

## 2013-01-22 DIAGNOSIS — G5790 Unspecified mononeuropathy of unspecified lower limb: Secondary | ICD-10-CM | POA: Insufficient documentation

## 2013-01-22 DIAGNOSIS — G629 Polyneuropathy, unspecified: Secondary | ICD-10-CM

## 2013-01-22 DIAGNOSIS — Z8659 Personal history of other mental and behavioral disorders: Secondary | ICD-10-CM | POA: Insufficient documentation

## 2013-01-22 DIAGNOSIS — E119 Type 2 diabetes mellitus without complications: Secondary | ICD-10-CM | POA: Insufficient documentation

## 2013-01-22 DIAGNOSIS — Z8719 Personal history of other diseases of the digestive system: Secondary | ICD-10-CM | POA: Insufficient documentation

## 2013-01-22 DIAGNOSIS — Z8673 Personal history of transient ischemic attack (TIA), and cerebral infarction without residual deficits: Secondary | ICD-10-CM | POA: Insufficient documentation

## 2013-01-22 DIAGNOSIS — F17200 Nicotine dependence, unspecified, uncomplicated: Secondary | ICD-10-CM | POA: Insufficient documentation

## 2013-01-22 DIAGNOSIS — Z794 Long term (current) use of insulin: Secondary | ICD-10-CM | POA: Insufficient documentation

## 2013-01-22 DIAGNOSIS — F1021 Alcohol dependence, in remission: Secondary | ICD-10-CM | POA: Insufficient documentation

## 2013-01-22 DIAGNOSIS — Z79899 Other long term (current) drug therapy: Secondary | ICD-10-CM | POA: Insufficient documentation

## 2013-01-22 MED ORDER — KETOROLAC TROMETHAMINE 60 MG/2ML IM SOLN
60.0000 mg | Freq: Once | INTRAMUSCULAR | Status: AC
  Administered 2013-01-22: 60 mg via INTRAMUSCULAR
  Filled 2013-01-22: qty 2

## 2013-01-22 NOTE — ED Notes (Signed)
Pt comfortable with d/c and f/u instructions. No prescriptions 

## 2013-01-22 NOTE — ED Notes (Addendum)
Pt ambulatory to room from home via [facility] with c/o neuropathic pain in BLE x2 weeks. Pt states he needs refills of Lortab and Xanax.

## 2013-01-22 NOTE — ED Notes (Signed)
Dr. Palumbo at bedside. 

## 2013-01-22 NOTE — ED Provider Notes (Signed)
CSN: [id number]     Arrival date & time 01/22/13  0531 History   First MD Initiated Contact with Patient 01/22/13 [phone]     Chief Complaint  Patient presents with  . Neuropathic Pain of Legs    (Consider location/radiation/quality/duration/timing/severity/associated sxs/prior Treatment) Patient is a 57 y.o. male presenting with leg pain.  Leg Pain Location:  Foot Injury: no   Foot location:  R foot and L foot Pain details:    Quality:  Burning   Radiates to:  Does not radiate   Severity:  Moderate   Onset quality:  Gradual   Timing:  Constant   Progression:  Unchanged Chronicity:  Chronic Dislocation: no   Foreign body present:  No foreign bodies Relieved by:  Nothing Worsened by:  Nothing tried Ineffective treatments:  None tried Associated symptoms: no back pain, no decreased ROM, no fatigue, no fever, no itching, no muscle weakness, no stiffness and no swelling     Past Medical History  Diagnosis Date  . Neuropathy   . Diabetes mellitus   . Bipolar affect, depressed   . Hypertension   . Arthritis   . Stroke     Mini stroke about 3yrs ago  . Cirrhosis   . Alcohol abuse    Past Surgical History  Procedure Laterality Date  . Fracture surgery      Leg and arm 54yrs ago  . Esophagogastroduodenoscopy  04/04/2012    Procedure: ESOPHAGOGASTRODUODENOSCOPY (EGD);  Surgeon: Hilarie Fredrickson, MD;  Location: [facility] ENDOSCOPY;  Service: Endoscopy;  Laterality: N/A;  . Eye surgery  8 months ago both eyes   Family History  Problem Relation Age of Onset  . Hypotension Mother    History  Substance Use Topics  . Smoking status: Current Every Day Smoker -- 0.50 packs/day for 30 years    Types: Cigarettes    Last Attempt to Quit: 04/06/2012  . Smokeless tobacco: Never Used     Comment: quit   . Alcohol Use: 0.0 oz/week     Comment: ALCOHOL ABUSE    Review of Systems  Constitutional: Negative for fever and fatigue.  Musculoskeletal: Negative for back pain and stiffness.  Skin:  Negative for itching.  All other systems reviewed and are negative.    Allergies  Review of patient's allergies indicates no known allergies.  Home Medications   Current Outpatient Rx  Name  Route  Sig  Dispense  Refill  . ALPRAZolam (XANAX) 0.5 MG tablet   Oral   Take 0.5 mg by mouth 3 (three) times daily as needed. For anxiety         . cephALEXin (KEFLEX) 500 MG capsule   Oral   Take 1 capsule (500 mg total) by mouth 4 (four) times daily.   40 capsule   0   . cholecalciferol (VITAMIN D) 1000 UNITS tablet   Oral   Take 1,000 Units by mouth at bedtime.          . diclofenac sodium (VOLTAREN) 1 % GEL   Topical   Apply 2 g topically daily. Apply to back and legs.         Marland Kitchen HYDROcodone-acetaminophen (NORCO) 10-325 MG per tablet   Oral   Take 1 tablet by mouth every 6 (six) hours as needed for pain.         Marland Kitchen insulin glargine (LANTUS) 100 UNIT/ML injection   Subcutaneous   Inject 45 Units into the skin at bedtime.         Marland Kitchen  lisinopril (PRINIVIL,ZESTRIL) 20 MG tablet   Oral   Take 20 mg by mouth daily.         . Multiple Vitamin (ONE-A-DAY ESSENTIAL PO)   Oral   Take 1 tablet by mouth daily.         . pantoprazole (PROTONIX) 40 MG tablet   Oral   Take 40 mg by mouth 2 (two) times daily.         Marland Kitchen pyridOXINE (VITAMIN B-6) 100 MG tablet   Oral   Take 100 mg by mouth daily.          . Skin Protectants, Misc. (EUCERIN) cream   Topical   Apply 1 application topically as needed for dry skin (apply to legs).           There were no vitals taken for this visit. Physical Exam  Constitutional: He is oriented to person, place, and time. He appears well-developed and well-nourished. No distress.  HENT:  Head: Normocephalic and atraumatic.  Mouth/Throat: Oropharynx is clear and moist. No oropharyngeal exudate.  Eyes: Conjunctivae are normal. Pupils are equal, round, and reactive to light.  Neck: Normal range of motion. Neck supple.   Cardiovascular: Normal rate, regular rhythm and intact distal pulses.   Pulmonary/Chest: Effort normal and breath sounds normal. He has no wheezes. He has no rales.  Abdominal: Soft. Bowel sounds are normal. There is no tenderness. There is no rebound and no guarding.  Musculoskeletal: Normal range of motion.  Neurological: He is alert and oriented to person, place, and time. He has normal reflexes.  Skin: Skin is warm and dry.  Psychiatric: He has a normal mood and affect.    ED Course  Procedures (including critical care time) Labs Review Labs Reviewed - No data to display Imaging Review No results found.  MDM  Patient has been drinking alcohol.  Will give a shot of toradol     Joelys Staubs K Ciara Kagan-Rasch, MD 01/22/13 [phone]

## 2013-02-04 ENCOUNTER — Encounter (HOSPITAL_COMMUNITY): Payer: Self-pay

## 2013-02-04 ENCOUNTER — Emergency Department (HOSPITAL_COMMUNITY)
Admission: EM | Admit: 2013-02-04 | Discharge: 2013-02-05 | Disposition: A | Discharge status: Other Acute Inpatient Hospital | Payer: Medicare PPO | Source: Home / Self Care | Attending: Emergency Medicine | Admitting: Emergency Medicine

## 2013-02-04 DIAGNOSIS — K7460 Unspecified cirrhosis of liver: Secondary | ICD-10-CM | POA: Diagnosis present

## 2013-02-04 DIAGNOSIS — F1020 Alcohol dependence, uncomplicated: Secondary | ICD-10-CM | POA: Diagnosis not present

## 2013-02-04 DIAGNOSIS — F1410 Cocaine abuse, uncomplicated: Secondary | ICD-10-CM

## 2013-02-04 DIAGNOSIS — G589 Mononeuropathy, unspecified: Secondary | ICD-10-CM | POA: Diagnosis present

## 2013-02-04 DIAGNOSIS — F1010 Alcohol abuse, uncomplicated: Secondary | ICD-10-CM

## 2013-02-04 DIAGNOSIS — F411 Generalized anxiety disorder: Secondary | ICD-10-CM | POA: Diagnosis present

## 2013-02-04 DIAGNOSIS — F10929 Alcohol use, unspecified with intoxication, unspecified: Secondary | ICD-10-CM

## 2013-02-04 DIAGNOSIS — F1420 Cocaine dependence, uncomplicated: Secondary | ICD-10-CM | POA: Diagnosis present

## 2013-02-04 DIAGNOSIS — I10 Essential (primary) hypertension: Secondary | ICD-10-CM | POA: Diagnosis present

## 2013-02-04 DIAGNOSIS — F3132 Bipolar disorder, current episode depressed, moderate: Secondary | ICD-10-CM | POA: Diagnosis present

## 2013-02-04 DIAGNOSIS — Z8673 Personal history of transient ischemic attack (TIA), and cerebral infarction without residual deficits: Secondary | ICD-10-CM

## 2013-02-04 DIAGNOSIS — R45851 Suicidal ideations: Secondary | ICD-10-CM

## 2013-02-04 DIAGNOSIS — E119 Type 2 diabetes mellitus without complications: Secondary | ICD-10-CM | POA: Diagnosis present

## 2013-02-04 DIAGNOSIS — Z794 Long term (current) use of insulin: Secondary | ICD-10-CM

## 2013-02-04 DIAGNOSIS — F3130 Bipolar disorder, current episode depressed, mild or moderate severity, unspecified: Secondary | ICD-10-CM

## 2013-02-04 HISTORY — DX: Cocaine abuse, uncomplicated: F14.10

## 2013-02-04 HISTORY — DX: Other chronic pain: G89.29

## 2013-02-04 LAB — ACETAMINOPHEN LEVEL: Acetaminophen (Tylenol), Serum: <15 ug/mL (ref 10–30)

## 2013-02-04 LAB — CBC
MCH: 30.9 pg (ref 26.0–34.0)
MCHC: 34.8 g/dL (ref 30.0–36.0)
Platelets: 64 10*3/uL — ABNORMAL LOW (ref 150–400)
RDW: 19.4 % — ABNORMAL HIGH (ref 11.5–15.5)

## 2013-02-04 LAB — COMPREHENSIVE METABOLIC PANEL
ALT: 31 U/L (ref 0–53)
AST: 94 U/L — ABNORMAL HIGH (ref 0–37)
Albumin: 2.9 g/dL — ABNORMAL LOW (ref 3.5–5.2)
Calcium: 8.7 mg/dL (ref 8.4–10.5)
GFR calc Af Amer: >90 mL/min (ref >90)
Glucose, Bld: 346 mg/dL — ABNORMAL HIGH (ref 70–99)
Sodium: 131 mEq/L — ABNORMAL LOW (ref 135–145)
Total Protein: 7.8 g/dL (ref 6.0–8.3)

## 2013-02-04 LAB — RAPID URINE DRUG SCREEN, HOSP PERFORMED
Barbiturates: NOT DETECTED
Tetrahydrocannabinol: NOT DETECTED

## 2013-02-04 LAB — GLUCOSE, CAPILLARY: Glucose-Capillary: 236 mg/dL — ABNORMAL HIGH (ref 70–99)

## 2013-02-04 MED ORDER — NICOTINE 21 MG/24HR TD PT24: 21.0000 mg | MEDICATED_PATCH | Freq: Every day | TRANSDERMAL | Status: DC | PRN

## 2013-02-04 MED ORDER — ADULT MULTIVITAMIN W/MINERALS CH: 1.0000 | ORAL_TABLET | Freq: Every day | ORAL | Status: DC

## 2013-02-04 MED ORDER — LISINOPRIL 20 MG PO TABS
20.0000 mg | ORAL_TABLET | Freq: Every day | ORAL | Status: DC
  Administered 2013-02-04: 20 mg via ORAL
  Filled 2013-02-04: qty 1

## 2013-02-04 MED ORDER — FOLIC ACID 1 MG PO TABS: 1.0000 mg | ORAL_TABLET | Freq: Every day | ORAL | Status: DC

## 2013-02-04 MED ORDER — LORAZEPAM 1 MG PO TABS
1.0000 mg | ORAL_TABLET | Freq: Four times a day (QID) | ORAL | Status: DC | PRN
  Filled 2013-02-04: qty 2

## 2013-02-04 MED ORDER — ONDANSETRON HCL 4 MG PO TABS: 4.0000 mg | ORAL_TABLET | Freq: Three times a day (TID) | ORAL | Status: DC | PRN

## 2013-02-04 MED ORDER — ONE-A-DAY ESSENTIAL PO TABS: ORAL_TABLET | Freq: Every day | ORAL | Status: DC

## 2013-02-04 MED ORDER — INSULIN GLARGINE 100 UNIT/ML ~~LOC~~ SOLN
45.0000 [IU] | Freq: Every day | SUBCUTANEOUS | Status: DC
  Administered 2013-02-04: 45 [IU] via SUBCUTANEOUS
  Filled 2013-02-04: qty 0.45

## 2013-02-04 MED ORDER — VITAMIN B-6 100 MG PO TABS
100.0000 mg | ORAL_TABLET | Freq: Every day | ORAL | Status: DC
  Administered 2013-02-04: 100 mg via ORAL
  Filled 2013-02-04: qty 1

## 2013-02-04 MED ORDER — PANTOPRAZOLE SODIUM 40 MG PO TBEC
40.0000 mg | DELAYED_RELEASE_TABLET | Freq: Two times a day (BID) | ORAL | Status: DC
  Administered 2013-02-04: 40 mg via ORAL
  Filled 2013-02-04: qty 1

## 2013-02-04 MED ORDER — VITAMIN D3 25 MCG (1000 UNIT) PO TABS
1000.0000 [IU] | ORAL_TABLET | Freq: Every day | ORAL | Status: DC
  Administered 2013-02-04: 1000 [IU] via ORAL
  Filled 2013-02-04: qty 1

## 2013-02-04 MED ORDER — VITAMIN B-1 100 MG PO TABS: 100.0000 mg | ORAL_TABLET | Freq: Every day | ORAL | Status: DC

## 2013-02-04 MED ORDER — ALUM & MAG HYDROXIDE-SIMETH 200-200-20 MG/5ML PO SUSP: 30.0000 mL | ORAL | Status: DC | PRN

## 2013-02-04 MED ORDER — LORAZEPAM 1 MG PO TABS: 0.0000 mg | ORAL_TABLET | Freq: Two times a day (BID) | ORAL | Status: DC

## 2013-02-04 MED ORDER — LORAZEPAM 2 MG/ML IJ SOLN: 1.0000 mg | Freq: Four times a day (QID) | INTRAMUSCULAR | Status: DC | PRN

## 2013-02-04 MED ORDER — LORAZEPAM 1 MG PO TABS
0.0000 mg | ORAL_TABLET | Freq: Four times a day (QID) | ORAL | Status: DC
  Administered 2013-02-04: 2 mg via ORAL
  Filled 2013-02-04: qty 2

## 2013-02-04 MED ORDER — HYDROCODONE-ACETAMINOPHEN 5-325 MG PO TABS: 1.0000 | ORAL_TABLET | Freq: Four times a day (QID) | ORAL | Status: DC | PRN

## 2013-02-04 MED ORDER — THIAMINE HCL 100 MG/ML IJ SOLN: 100.0000 mg | Freq: Every day | INTRAMUSCULAR | Status: DC

## 2013-02-04 NOTE — ED Notes (Signed)
Report given to shelia, rn
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
## 2013-02-04 NOTE — Progress Notes (Signed)
CSW attempted to facilitate a [facility] assessment.  Unable to assess pt.  Pt was hard to arouse and when CSW was finally able to wake him, his speech was slurred, and he kept falling asleep.  Will attempt assess pt again later in the evening.   Marva Panda, LCSWA  [phone] .02/04/2013  4:00 pm

## 2013-02-04 NOTE — ED Notes (Signed)
Bed: [facility] Expected date:  Expected time:  Means of arrival:  Comments: [place]
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
## 2013-02-04 NOTE — ED Provider Notes (Signed)
CSN: [id number]     Arrival date & time 02/04/13  4098 History   First MD Initiated Contact with Patient 02/04/13 [phone]     Chief Complaint  Patient presents with  . Suicidal  . Medical Clearance    HPI Pt was seen at 0945. Per pt, c/o gradual onset and worsening of persistent depression and SI for the past 2 weeks. Pt states he has been "loading the gun and holding it up to my head" in attempts to kill himself. States his brother took away his gun "but I still have a bunch swords I can use." States he "just wants to die" and "can't live like this." Endorses his "wife left him" and he "has not slept in 3 days." Denies HI.    Past Medical History  Diagnosis Date  . Neuropathy   . Diabetes mellitus   . Bipolar affect, depressed   . Hypertension   . Arthritis   . Stroke     Mini stroke about 17yrs ago  . Cirrhosis   . Alcohol abuse   . Chronic pain   . Cocaine abuse    Past Surgical History  Procedure Laterality Date  . Fracture surgery      Leg and arm 69yrs ago  . Esophagogastroduodenoscopy  04/04/2012    Procedure: ESOPHAGOGASTRODUODENOSCOPY (EGD);  Surgeon: Hilarie Fredrickson, MD;  Location[facility] ENDOSCOPY;  Service: Endoscopy;  Laterality: N/A;  . Eye surgery  8 months ago both eyes   Family History  Problem Relation Age of Onset  . Hypotension Mother    History  Substance Use Topics  . Smoking status: Current Every Day Smoker -- 0.50 packs/day for 30 years    Types: Cigarettes    Last Attempt to Quit: 04/06/2012  . Smokeless tobacco: Never Used     Comment: quit   . Alcohol Use: 0.0 oz/week     Comment: ALCOHOL ABUSE    Review of Systems ROS: Statement: All systems negative except as marked or noted in the HPI; Constitutional: Negative for fever and chills. ; ; Eyes: Negative for eye pain, redness and discharge. ; ; ENMT: Negative for ear pain, hoarseness, nasal congestion, sinus pressure and sore throat. ; ; Cardiovascular: Negative for chest pain, palpitations,  diaphoresis, dyspnea and peripheral edema. ; ; Respiratory: Negative for cough, wheezing and stridor. ; ; Gastrointestinal: Negative for nausea, vomiting, diarrhea, abdominal pain, blood in stool, hematemesis, jaundice and rectal bleeding. . ; ; Genitourinary: Negative for dysuria, flank pain and hematuria. ; ; Musculoskeletal: Negative for back pain and neck pain. Negative for swelling and trauma.; ; Skin: Negative for pruritus, rash, abrasions, blisters, bruising and skin lesion.; ; Neuro: Negative for headache, lightheadedness and neck stiffness. Negative for weakness, altered level of consciousness , altered mental status, extremity weakness, paresthesias, involuntary movement, seizure and syncope.; Psych:  +SI. No SA, no HI, no hallucinations.      Allergies  Review of patient's allergies indicates no known allergies.  Home Medications   Current Outpatient Rx  Name  Route  Sig  Dispense  Refill  . ALPRAZolam (XANAX) 0.5 MG tablet   Oral   Take 0.5 mg by mouth 3 (three) times daily as needed. For anxiety         . cholecalciferol (VITAMIN D) 1000 UNITS tablet   Oral   Take 1,000 Units by mouth at bedtime.          . diclofenac sodium (VOLTAREN) 1 % GEL   Topical  Apply 2 g topically daily. Apply to back and legs.         Marland Kitchen HYDROcodone-acetaminophen (NORCO) 10-325 MG per tablet   Oral   Take 1 tablet by mouth every 6 (six) hours as needed for pain.         Marland Kitchen insulin glargine (LANTUS) 100 UNIT/ML injection   Subcutaneous   Inject 45 Units into the skin at bedtime.         Marland Kitchen lisinopril (PRINIVIL,ZESTRIL) 20 MG tablet   Oral   Take 20 mg by mouth daily.         . Multiple Vitamin (ONE-A-DAY ESSENTIAL PO)   Oral   Take 1 tablet by mouth daily.         . pantoprazole (PROTONIX) 40 MG tablet   Oral   Take 40 mg by mouth 2 (two) times daily.         Marland Kitchen pyridOXINE (VITAMIN B-6) 100 MG tablet   Oral   Take 100 mg by mouth daily.          . Skin Protectants,  Misc. (EUCERIN) cream   Topical   Apply 1 application topically as needed for dry skin (apply to legs).           BP 150/83  Pulse 92  Temp(Src) 98.1 F (36.7 C) (Oral)  Resp 18  SpO2 97% Physical Exam 0850: Physical examination:  Nursing notes reviewed; Vital signs and O2 SAT reviewed;  Constitutional: Well developed, Well nourished, Well hydrated, In no acute distress; Head:  Normocephalic, atraumatic; Eyes: EOMI, PERRL, No scleral icterus; ENMT: Mouth and pharynx normal, Mucous membranes moist; Neck: Supple, Full range of motion, No lymphadenopathy; Cardiovascular: Regular rate and rhythm, No murmur, rub, or gallop; Respiratory: Breath sounds clear & equal bilaterally, No rales, rhonchi, wheezes.  Speaking full sentences with ease, Normal respiratory effort/excursion; Chest: Nontender, Movement normal; Abdomen: Soft, Nontender, Nondistended, Normal bowel sounds; Genitourinary: No CVA tenderness; Extremities: Pulses normal, No tenderness, No edema, No calf edema or asymmetry.; Neuro: AA&Ox3, Major CN grossly intact.  Speech clear. No gross focal motor or sensory deficits in extremities.; Skin: Color normal, Warm, Dry.; Psych:  Tearful at times, +SI.    ED Course  Procedures     MDM  MDM Reviewed: previous chart, nursing note and vitals Reviewed previous: labs Interpretation: labs     Results for orders placed during the hospital encounter of 02/04/13  ACETAMINOPHEN LEVEL      Result Value Range   Acetaminophen (Tylenol), Serum <15.0  10 - 30 ug/mL  CBC      Result Value Range   WBC 3.8 (*) 4.0 - 10.5 K/uL   RBC 3.91 (*) 4.22 - 5.81 MIL/uL   Hemoglobin 12.1 (*) 13.0 - 17.0 g/dL   HCT 16.1 (*) 09.6 - 04.5 %   MCV 89.0  78.0 - 100.0 fL   MCH 30.9  26.0 - 34.0 pg   MCHC 34.8  30.0 - 36.0 g/dL   RDW 40.9 (*) 81.1 - 91.4 %   Platelets 64 (*) 150 - 400 K/uL  COMPREHENSIVE METABOLIC PANEL      Result Value Range   Sodium 131 (*) 135 - 145 mEq/L   Potassium 4.4  3.5 - 5.1  mEq/L   Chloride 97  96 - 112 mEq/L   CO2 23  19 - 32 mEq/L   Glucose, Bld 346 (*) 70 - 99 mg/dL   BUN 11  6 - 23 mg/dL   Creatinine, Ser  0.81  0.50 - 1.35 mg/dL   Calcium 8.7  8.4 - 78.2 mg/dL   Total Protein 7.8  6.0 - 8.3 g/dL   Albumin 2.9 (*) 3.5 - 5.2 g/dL   AST 94 (*) 0 - 37 U/L   ALT 31  0 - 53 U/L   Alkaline Phosphatase 186 (*) 39 - 117 U/L   Total Bilirubin 3.2 (*) 0.3 - 1.2 mg/dL   GFR calc non Af Amer >90  >90 mL/min   GFR calc Af Amer >90  >90 mL/min  ETHANOL      Result Value Range   Alcohol, Ethyl (B) 281 (*) 0 - 11 mg/dL  SALICYLATE LEVEL      Result Value Range   Salicylate Lvl <2.0 (*) 2.8 - 20.0 mg/dL  URINE RAPID DRUG SCREEN (HOSP PERFORMED)      Result Value Range   Opiates NONE DETECTED  NONE DETECTED   Cocaine POSITIVE (*) NONE DETECTED   Benzodiazepines NONE DETECTED  NONE DETECTED   Amphetamines NONE DETECTED  NONE DETECTED   Tetrahydrocannabinol NONE DETECTED  NONE DETECTED   Barbiturates NONE DETECTED  NONE DETECTED   Results for [name], [name] (MRN [id number]) as of 02/04/2013 10:59  Ref. Range 11/09/2012 06:21 11/09/2012 15:56 12/12/2012 23:56 12/15/2012 04:05 01/09/2013 12:52 01/10/2013 04:01 01/17/2013 00:45 02/04/2013 09:57  AST Latest Range: 0-37 U/L 92 (H) 68 (H) 53 (H) 46 (H) 46 (H) 48 (H) 76 (H) 94 (H)  ALT Latest Range: 0-53 U/L 32 31 22 19 16 17 25 31      1100:  Intoxicated per hx etoh abuse. Will need psych to eval for admission for SI.     Laray Anger, DO 02/04/13 1555

## 2013-02-05 ENCOUNTER — Encounter (HOSPITAL_COMMUNITY): Payer: Self-pay | Admitting: *Deleted

## 2013-02-05 ENCOUNTER — Inpatient Hospital Stay (HOSPITAL_COMMUNITY)
Admission: AD | Admit: 2013-02-05 | Discharge: 2013-02-06 | DRG: 897 | Disposition: A | Discharge status: Home / Self Care | Payer: Medicare PPO | Source: Intra-hospital | Attending: Psychiatry | Admitting: Psychiatry

## 2013-02-05 DIAGNOSIS — E119 Type 2 diabetes mellitus without complications: Secondary | ICD-10-CM | POA: Diagnosis present

## 2013-02-05 DIAGNOSIS — K7460 Unspecified cirrhosis of liver: Secondary | ICD-10-CM | POA: Diagnosis present

## 2013-02-05 DIAGNOSIS — F411 Generalized anxiety disorder: Secondary | ICD-10-CM | POA: Diagnosis present

## 2013-02-05 DIAGNOSIS — F1010 Alcohol abuse, uncomplicated: Secondary | ICD-10-CM

## 2013-02-05 DIAGNOSIS — F1020 Alcohol dependence, uncomplicated: Principal | ICD-10-CM

## 2013-02-05 DIAGNOSIS — I10 Essential (primary) hypertension: Secondary | ICD-10-CM | POA: Diagnosis present

## 2013-02-05 DIAGNOSIS — F1420 Cocaine dependence, uncomplicated: Secondary | ICD-10-CM

## 2013-02-05 DIAGNOSIS — F3132 Bipolar disorder, current episode depressed, moderate: Secondary | ICD-10-CM | POA: Diagnosis present

## 2013-02-05 DIAGNOSIS — Z794 Long term (current) use of insulin: Secondary | ICD-10-CM | POA: Diagnosis not present

## 2013-02-05 DIAGNOSIS — Z8673 Personal history of transient ischemic attack (TIA), and cerebral infarction without residual deficits: Secondary | ICD-10-CM | POA: Diagnosis not present

## 2013-02-05 DIAGNOSIS — F3130 Bipolar disorder, current episode depressed, mild or moderate severity, unspecified: Secondary | ICD-10-CM | POA: Diagnosis present

## 2013-02-05 DIAGNOSIS — F329 Major depressive disorder, single episode, unspecified: Secondary | ICD-10-CM

## 2013-02-05 DIAGNOSIS — G589 Mononeuropathy, unspecified: Secondary | ICD-10-CM | POA: Diagnosis present

## 2013-02-05 HISTORY — DX: Other muscle spasm: M62.838

## 2013-02-05 LAB — GLUCOSE, CAPILLARY: Glucose-Capillary: 169 mg/dL — ABNORMAL HIGH (ref 70–99)

## 2013-02-05 MED ORDER — INSULIN ASPART 100 UNIT/ML ~~LOC~~ SOLN
6.0000 [IU] | Freq: Three times a day (TID) | SUBCUTANEOUS | Status: DC
  Administered 2013-02-05 – 2013-02-06 (×4): via SUBCUTANEOUS

## 2013-02-05 MED ORDER — VITAMIN D3 25 MCG (1000 UNIT) PO TABS
1000.0000 [IU] | ORAL_TABLET | Freq: Every day | ORAL | Status: DC
  Administered 2013-02-05: 1000 [IU] via ORAL
  Filled 2013-02-05 (×2): qty 1

## 2013-02-05 MED ORDER — TRAZODONE HCL 50 MG PO TABS
50.0000 mg | ORAL_TABLET | Freq: Every evening | ORAL | Status: DC | PRN
  Administered 2013-02-06: 50 mg via ORAL
  Filled 2013-02-05: qty 28

## 2013-02-05 MED ORDER — INSULIN ASPART 100 UNIT/ML ~~LOC~~ SOLN
0.0000 [IU] | Freq: Three times a day (TID) | SUBCUTANEOUS | Status: DC
  Administered 2013-02-05 – 2013-02-06 (×2): 3 [IU] via SUBCUTANEOUS

## 2013-02-05 MED ORDER — ADULT MULTIVITAMIN W/MINERALS CH
1.0000 | ORAL_TABLET | Freq: Every day | ORAL | Status: DC
Start: 2013-02-05 — End: 2013-02-05
  Filled 2013-02-05 (×3): qty 1

## 2013-02-05 MED ORDER — LISINOPRIL 20 MG PO TABS
20.0000 mg | ORAL_TABLET | Freq: Every day | ORAL | Status: DC
  Administered 2013-02-05 – 2013-02-06 (×2): 20 mg via ORAL
  Filled 2013-02-05 (×3): qty 1

## 2013-02-05 MED ORDER — HYDROCODONE-ACETAMINOPHEN 5-325 MG PO TABS
1.0000 | ORAL_TABLET | Freq: Four times a day (QID) | ORAL | Status: DC | PRN
  Administered 2013-02-05 – 2013-02-06 (×4): 1 via ORAL
  Filled 2013-02-05 (×4): qty 1

## 2013-02-05 MED ORDER — ALUM & MAG HYDROXIDE-SIMETH 200-200-20 MG/5ML PO SUSP: 30.0000 mL | ORAL | Status: DC | PRN

## 2013-02-05 MED ORDER — VITAMIN B-1 100 MG PO TABS
100.0000 mg | ORAL_TABLET | Freq: Every day | ORAL | Status: DC
  Administered 2013-02-06: 08:00:00 100 mg via ORAL
  Filled 2013-02-05 (×3): qty 1

## 2013-02-05 MED ORDER — THIAMINE HCL 100 MG/ML IJ SOLN: 100.0000 mg | Freq: Every day | INTRAMUSCULAR | Status: DC

## 2013-02-05 MED ORDER — FOLIC ACID 1 MG PO TABS
1.0000 mg | ORAL_TABLET | Freq: Every day | ORAL | Status: DC
  Administered 2013-02-05 – 2013-02-06 (×2): 1 mg via ORAL
  Filled 2013-02-05 (×3): qty 1

## 2013-02-05 MED ORDER — PANTOPRAZOLE SODIUM 40 MG PO TBEC
40.0000 mg | DELAYED_RELEASE_TABLET | Freq: Two times a day (BID) | ORAL | Status: DC
  Administered 2013-02-05 – 2013-02-06 (×3): 40 mg via ORAL
  Filled 2013-02-05 (×5): qty 1

## 2013-02-05 MED ORDER — INSULIN GLARGINE 100 UNIT/ML ~~LOC~~ SOLN
45.0000 [IU] | Freq: Every day | SUBCUTANEOUS | Status: DC
  Administered 2013-02-05: 45 [IU] via SUBCUTANEOUS

## 2013-02-05 MED ORDER — ADULT MULTIVITAMIN W/MINERALS CH
1.0000 | ORAL_TABLET | Freq: Every day | ORAL | Status: DC
  Administered 2013-02-05 – 2013-02-06 (×2): 1 via ORAL
  Filled 2013-02-05 (×3): qty 1

## 2013-02-05 MED ORDER — ACETAMINOPHEN 325 MG PO TABS: 650.0000 mg | ORAL_TABLET | Freq: Four times a day (QID) | ORAL | Status: DC | PRN

## 2013-02-05 MED ORDER — LORAZEPAM 1 MG PO TABS
1.0000 mg | ORAL_TABLET | Freq: Four times a day (QID) | ORAL | Status: DC | PRN
  Administered 2013-02-05 – 2013-02-06 (×4): 1 mg via ORAL
  Filled 2013-02-05 (×4): qty 1

## 2013-02-05 MED ORDER — MAGNESIUM HYDROXIDE 400 MG/5ML PO SUSP: 30.0000 mL | Freq: Every day | ORAL | Status: DC | PRN

## 2013-02-05 MED ORDER — LORAZEPAM 2 MG/ML IJ SOLN: 1.0000 mg | Freq: Four times a day (QID) | INTRAMUSCULAR | Status: DC | PRN

## 2013-02-05 NOTE — Progress Notes (Signed)
Patient ID: [name], male   DOB: 1955-06-11, 57 y.o.   MRN: [id number] He has been in bed most of today.  He requested and received prn this am for anxiety and leg pain. Stated the medication did help.  Has not requested additional medication.

## 2013-02-05 NOTE — H&P (Signed)
Psychiatric Admission Assessment Adult  Patient Identification:  [name]  Date of Evaluation:  02/05/2013  Chief Complaint:  ALCOHOL DEPENDENCE  History of Present Illness: This is a 57 year old Caucasian male. Admitted to [facility] from the [facility] with complaints of alcohol intoxication requesting detox. Patient reports, "I've been drinking a lot of alcohol and using cocaine too. I have been doing this x 2 weeks to calm myself. Whenever I'm out of my medicines, I drink and use cocaine to help myself. I drink about 6 packs daily. I use cocaine probably once a month. I also have bipolar disorder, diagnosed 6 years ago.Somedays, I will get so confused that I don't even realize what was going on, other times, I don't remember much, I mean I literally forgets. I have had 2 separate psychiatrists in the past. They all moved away. I see my primary care physician, Dr. August Luz. He prescribed me Xanax pills. The Xanax works for my anxiety. I have bad anxiety, bad mood swings, bad vision and I'm disabled. I gave up my driver's license because I have bad vision. The longest that I have been sober was 8 years, and that was about 3 years ago. Right, I have the shakes, bad anxiety, bad depression and I feel sick to my stomach. My anxiety right now is at #4, depression #10"  Elements:  Location:  [facility] adult unit. Quality:  Tremors, cravings, high anxiety, deep depression, insomnia, fatigue. Severity:  Severe. Timing:  Started 2 weeks ago. Duration:  Chronic alcoholism/drug use. Context:  "Ran out of my medicines about 2 weeks ago, unable to get to my doctor for my Xanax, got very anxious/depressed. Started using cocaine and drinking alcohol. Both calms me".  Associated Signs/Synptoms:  Depression Symptoms:  depressed mood, hopelessness, anxiety, insomnia, loss of energy/fatigue,  (Hypo) Manic Symptoms:  Flight of Ideas, Impulsivity, Irritable Mood, Labiality of Mood,  Anxiety Symptoms:   Excessive Worry, Panic Symptoms,  Psychotic Symptoms:  Hallucinations: None  PTSD Symptoms: Had a traumatic exposure:  None reported  Psychiatric Specialty Exam: Physical Exam  Constitutional: He appears well-developed.  HENT:  Head: Normocephalic.  Eyes:  Poor vision bilaterally  Neck: Normal range of motion.  Cardiovascular: Normal rate.   Respiratory: Effort normal.  GI: Soft.  Musculoskeletal: Normal range of motion.  Neurological: He is alert.  Skin: Skin is warm and dry.  Large abrasion to inner right arm.   Psychiatric: His speech is normal. Thought content normal. His mood appears anxious (Rated at #4). He is slowed. Cognition and memory are impaired (Has problems with remembering recent events.). He expresses impulsivity. He exhibits a depressed mood (Rated at #10).    Review of Systems  Constitutional: Positive for chills and malaise/fatigue.  Eyes: Positive for blurred vision.  Respiratory: Negative.   Cardiovascular: Negative.   Gastrointestinal: Positive for nausea and abdominal pain.  Genitourinary: Negative.   Musculoskeletal: Positive for myalgias.  Skin:       A large abrasion to inner right arm.   Neurological: Positive for dizziness, tremors, weakness and headaches.  Endo/Heme/Allergies: Negative.   Psychiatric/Behavioral: Positive for depression (rated at #10) and substance abuse (Alcoholism, cocaine dependence). Negative for suicidal ideas, hallucinations and memory loss. The patient is nervous/anxious (rated at #4) and has insomnia.     Blood pressure 138/73, pulse 106, temperature 98.1 F (36.7 C), temperature source Oral, resp. rate 18, height 5\' 7"  (1.702 m), weight 105.688 kg (233 lb).Body mass index is 36.48 kg/(m^2).  General  Appearance: Disheveled and a large abrasion to inner right right arm  Eye Contact::  Fair  Speech:  Clear and Coherent  Volume:  Normal  Mood:  Anxious and Depressed, rated depression at #10, anxiey at #4  Affect:   Restricted, teary at times  Thought Process:  Coherent and Intact  Orientation:  Other:  Oriented to name and place  Thought Content:  Rumination  Suicidal Thoughts:  No  Homicidal Thoughts:  No  Memory:  Immediate;   Fair Recent;   Poor Remote;   Fair  Judgement:  Impaired  Insight:  Fair  Psychomotor Activity:  Tremor  Concentration:  Fair  Recall:  Fair  Akathisia:  No  Handed:  Right  AIMS (if indicated):     Assets:  Desire for Improvement  Sleep:  Number of Hours: 3.75    Past Psychiatric History: Diagnosis: Alcohol withdrawal, Alcohol dependence, Cocaine dependence  Hospitalizations: [facility]  Outpatient Care: (With Dr. August Luz), primary physician  Substance Abuse Care: [place] about 3 years ago  Self-Mutilation: Denies  Suicidal Attempts: Denies attempts and or plans  Violent Behaviors: See medication lists   Past Medical History:   Past Medical History  Diagnosis Date  . Neuropathy   . Diabetes mellitus   . Bipolar affect, depressed   . Hypertension   . Arthritis   . Stroke     Mini stroke about 95yrs ago  . Cirrhosis   . Alcohol abuse   . Chronic pain   . Cocaine abuse   . Muscle spasm     both legs   Cardiac History:  HTN, Stroke  Allergies:  No Known Allergies  PTA Medications: Prescriptions prior to admission  Medication Sig Dispense Refill  . ALPRAZolam (XANAX) 0.5 MG tablet Take 0.5 mg by mouth 3 (three) times daily as needed. For anxiety      . cholecalciferol (VITAMIN D) 1000 UNITS tablet Take 1,000 Units by mouth at bedtime.       . diclofenac sodium (VOLTAREN) 1 % GEL Apply 2 g topically daily. Apply to back and legs.      . insulin glargine (LANTUS) 100 UNIT/ML injection Inject 45 Units into the skin at bedtime.      Marland Kitchen lisinopril (PRINIVIL,ZESTRIL) 20 MG tablet Take 20 mg by mouth daily.      . Multiple Vitamin (ONE-A-DAY ESSENTIAL PO) Take 1 tablet by mouth daily.      Marland Kitchen pyridOXINE (VITAMIN B-6) 100 MG tablet Take 100 mg by mouth daily.        Marland Kitchen HYDROcodone-acetaminophen (NORCO) 10-325 MG per tablet Take 1 tablet by mouth every 6 (six) hours as needed for pain.      . pantoprazole (PROTONIX) 40 MG tablet Take 40 mg by mouth 2 (two) times daily.      . Skin Protectants, Misc. (EUCERIN) cream Apply 1 application topically as needed for dry skin (apply to legs).        Previous Psychotropic Medications:  Medication/Dose  See medication lists               Substance Abuse History in the last 12 months:  yes  Consequences of Substance Abuse: Medical Consequences:  Liver damage, Possible death by overdose Legal Consequences:  Arrests, jail time, Loss of driving privilege. Family Consequences:  Family discord, divorce and or separation.  Social History:  reports that he has been smoking Cigarettes.  He has a 30 pack-year smoking history. He has never used smokeless tobacco. He reports  that  drinks alcohol. He reports that he uses illicit drugs (Cocaine). Additional Social History:  Current Place of Residence: [place], [place]    Place of Birth: [place]  Family Members: "My fiance"  Marital Status:  Engaged  Children:  Sons:  Daughters:  Relationships:  Education:  GED  Educational Problems/Performance: Obtained GED certificate  Religious Beliefs/Practices: NA  History of Abuse (Emotional/Phsycial/Sexual): Denies  Occupational Experiences: Disabled  Military History:  None.  Legal History: Denies any pending legal charges  Hobbies/Interests: None reported  Family History:   Family History  Problem Relation Age of Onset  . Hypotension Mother     Results for orders placed during the hospital encounter of 02/05/13 (from the past 72 hour(s))  GLUCOSE, CAPILLARY     Status: Abnormal   Collection Time    02/05/13  2:08 AM      Result Value Range   Glucose-Capillary 187 (*) 70 - 99 mg/dL  GLUCOSE, CAPILLARY     Status: Abnormal   Collection Time    02/05/13  6:26 AM      Result Value Range    Glucose-Capillary 131 (*) 70 - 99 mg/dL   Psychological Evaluations:  Assessment:   DSM5: Schizophrenia Disorders:  NA Obsessive-Compulsive Disorders:  NA Trauma-Stressor Disorders:  Anxiety disorder Substance/Addictive Disorders:  Alcohol dependence, Cocaine dependence Depressive Disorders:  Bipolar affective disorder, depressed.  AXIS I:  Alcohol dependence, Cocaine dependence, Bipolar affective disorder, depressed AXIS II:  Deferred AXIS III:   Past Medical History  Diagnosis Date  . Neuropathy   . Diabetes mellitus   . Bipolar affect, depressed   . Hypertension   . Arthritis   . Stroke     Mini stroke about 14yrs ago  . Cirrhosis   . Alcohol abuse   . Chronic pain   . Cocaine abuse   . Muscle spasm     both legs   AXIS IV:  other psychosocial or environmental problems and Alcoholism, cocaine dependence AXIS V:  1-10 persistent dangerousness to self and others present  Treatment Plan/Recommendations: 1. Admit for crisis management and stabilization, estimated length of stay 3-5 days.  2. Medication management to reduce current symptoms to base line and improve the patient's overall level of functioning  3. Treat health problems as indicated.  4. Develop treatment plan to decrease risk of relapse upon discharge and the need for readmission.  5. Psycho-social education regarding relapse prevention and self care.  6. Health care follow up as needed for medical problems.  7. Review, reconcile, and reinstate any pertinent home medications for other health issues where appropriate. 8. Call for consults with hospitalist for any additional specialty patient care services as needed.  Treatment Plan Summary: Daily contact with patient to assess and evaluate symptoms and progress in treatment Medication management Supportive approach/coping skills/relapse prevention Detox/reassess and address the comorbidities Current Medications:  Current Facility-Administered Medications   Medication Dose Route Frequency Provider Last Rate Last Dose  . acetaminophen (TYLENOL) tablet 650 mg  650 mg Oral Q6H PRN Evanna Janann August, NP      . alum & mag hydroxide-simeth (MAALOX/MYLANTA) 200-200-20 MG/5ML suspension 30 mL  30 mL Oral Q4H PRN Evanna Janann August, NP      . cholecalciferol (VITAMIN D) tablet 1,000 Units  1,000 Units Oral QHS Evanna Janann August, NP      . folic acid (FOLVITE) tablet 1 mg  1 mg Oral Daily Evanna Janann August, NP   1 mg at 02/05/13 0835  .  HYDROcodone-acetaminophen (NORCO/VICODIN) 5-325 MG per tablet 1 tablet  1 tablet Oral Q6H PRN Audrea Muscat, NP   1 tablet at 02/05/13 [phone]  . insulin glargine (LANTUS) injection 45 Units  45 Units Subcutaneous QHS Evanna Cori Burkett, NP      . lisinopril (PRINIVIL,ZESTRIL) tablet 20 mg  20 mg Oral Daily Evanna Cori Merry Proud, NP   20 mg at 02/05/13 0835  . LORazepam (ATIVAN) tablet 1 mg  1 mg Oral Q6H PRN Audrea Muscat, NP   1 mg at 02/05/13 0841   Or  . LORazepam (ATIVAN) injection 1 mg  1 mg Intravenous Q6H PRN Evanna Janann August, NP      . magnesium hydroxide (MILK OF MAGNESIA) suspension 30 mL  30 mL Oral Daily PRN Evanna Janann August, NP      . multivitamin with minerals tablet 1 tablet  1 tablet Oral Daily Evanna Janann August, NP      . multivitamin with minerals tablet 1 tablet  1 tablet Oral Daily Evanna Janann August, NP   1 tablet at 02/05/13 0836  . pantoprazole (PROTONIX) EC tablet 40 mg  40 mg Oral BID Audrea Muscat, NP   40 mg at 02/05/13 0836  . thiamine (VITAMIN B-1) tablet 100 mg  100 mg Oral Daily Evanna Janann August, NP       Or  . thiamine (B-1) injection 100 mg  100 mg Intravenous Daily Evanna Cori Burkett, NP      . traZODone (DESYREL) tablet 50 mg  50 mg Oral QHS PRN,MR X 1 Evanna Janann August, NP        Observation Level/Precautions:  15 minute checks  Laboratory:  Reviewed ED lab findings on file  Psychotherapy: Group sessions   Medications:  See medication lists   Consultations:  As needed  Discharge Concerns:  Maintaining sobriety  Estimated LOS: 3-5 days  Other:     I certify that inpatient services furnished can reasonably be expected to improve the patient's condition.   Sanjuana Kava, PMHNP-BC 9/16/20149:25 AM  Personally examined patient, reviewed physical exam and agree with assessment and plan Rachael Fee, M.D.

## 2013-02-05 NOTE — Progress Notes (Signed)
Adult Psychoeducational Group Note  Date:  02/05/2013 Time:  1:46 PM  Group Topic/Focus:  Recovery Goals:   The focus of this group is to identify appropriate goals for recovery and establish a plan to achieve them.  Participation Level:  Did Not Attend  Cathlean Cower 02/05/2013, 1:46 PM

## 2013-02-05 NOTE — Progress Notes (Signed)
Recreation Therapy Notes  Date: 09.16.2014 Time: 2:30pm Location: [address] Dayroom  Group Topic: Animal Assisted Activities  Behavioral Response: Did not attend   [employer], LRT/CTRS  Jearl Klinefelter 02/05/2013 4:38 PM

## 2013-02-05 NOTE — Progress Notes (Signed)
Pt. Was in bed when writer attempted the assessment.  Pt. States that he is not feeling good and does not feel like going to dinner.  Pt. Is a diabetic and had to be encouraged to get up and eat dinner this pm.  Writer gave pt. His PRN pain medicine for his leg pain and  Gave him ativan for his anxiety.   Pt. Stated that it helped reduce the pain and anxiety.  Pt. Did eat his dinner and writer then administered his novolog.  Pt. Has since been resting quietly.

## 2013-02-05 NOTE — Progress Notes (Signed)
Patient ID: [name], male   DOB: August 28, 1955, 57 y.o.   MRN: [id number]   Pt was pleasant and cooperative but sad and depressed during the adm process. Stated he recently found his fiance in bed with another man. According to pt his alcohol consumption increased due to the incident. However, pt also tested positive for cocaine and admits to doing 1 gram weekly. At times during the interview pt seemed to minimize alcohol use, due to inconsistency with answers on the alcohol screening.  Pt has abrasions to his right forearm and right side of back due to a fall.  Pt has tremors which he attributes to anxiety and complained of leg pain.  Pt is IDDM and has neuropathy in lower extremities as well as edema. [name] CBG at 0210 was 187. Pt denied SI, HI, A/V during adm.

## 2013-02-05 NOTE — Progress Notes (Signed)
Recreation Therapy Notes  Date: 09.15.2014 Time: 3:00pm Location: [address] Day[address]  Group Topic: Coping Skills  Goal Area(s) Addresses:  Patient will verbalize importance of recognizing emotions. Patient will identify at least one emotion. Patient will successfully represent varying emotions in pictures or words.   Behavioral Response: Did not attend  Jearl Klinefelter, LRT/CTRS  Jearl Klinefelter 02/05/2013 5:06 PM

## 2013-02-05 NOTE — Tx Team (Addendum)
Initial Interdisciplinary Treatment Plan  PATIENT STRENGTHS: (choose at least two) Ability for insight Average or above average intelligence Capable of independent living Communication skills General fund of knowledge Motivation for treatment/growth Physical Health Special hobby/interest Supportive family/friends Work skills  PATIENT STRESSORS: Financial difficulties Health problems Legal issue Marital or family conflict Substance abuse   PROBLEM LIST: Problem List/Patient Goals Date to be addressed Date deferred Reason deferred Estimated date of resolution  "Find out what kind of meds I should be on" 02/05/13     "to stop drinking" 02/05/13           depression 02/05/13     Increased risk forsuicide 02/05/13     Substance abuse 02/05/13                        DISCHARGE CRITERIA:  Ability to meet basic life and health needs Adequate post-discharge living arrangements Improved stabilization in mood, thinking, and/or behavior Medical problems require only outpatient monitoring Motivation to continue treatment in a less acute level of care Need for constant or close observation no longer present Reduction of life-threatening or endangering symptoms to within safe limits Safe-care adequate arrangements made Verbal commitment to aftercare and medication compliance Withdrawal symptoms are absent or subacute and managed without 24-hour nursing intervention  PRELIMINARY DISCHARGE PLAN: Attend aftercare/continuing care group Attend 12-step recovery group Outpatient therapy Participate in family therapy Return to previous living arrangement  PATIENT/FAMIILY INVOLVEMENT: This treatment plan has been presented to and reviewed with the patient, [name], and/or family member.  The patient and family have been given the opportunity to ask questions and make suggestions.  Fransico Michael Laverne 02/05/2013, 1:53 AM

## 2013-02-05 NOTE — BHH Group Notes (Signed)
[facility] LCSW Group Therapy  02/05/2013 1:15 PM  Type of Therapy:  Group Therapy  Participation Level:  Did Not Attend  SmartHerbert Seta 02/05/2013, 3:34 PM

## 2013-02-05 NOTE — BHH Suicide Risk Assessment (Signed)
Suicide Risk Assessment  Admission Assessment     Nursing information obtained from:  Patient Demographic factors:  Male;Caucasian;Low socioeconomic status;Unemployed;Divorced or widowed Current Mental Status:  NA Loss Factors:  Financial problems / change in socioeconomic status;Decline in physical health;Legal issues Historical Factors:  Family history of mental illness or substance abuse Risk Reduction Factors:  Sense of responsibility to family;Religious beliefs about death;Living with another person, especially a relative  CLINICAL FACTORS:   Depression:   Comorbid alcohol abuse/dependence Alcohol/Substance Abuse/Dependencies  COGNITIVE FEATURES THAT CONTRIBUTE TO RISK:  Closed-mindedness Polarized thinking Thought constriction (tunnel vision)    SUICIDE RISK:   Moderate:  Frequent suicidal ideation with limited intensity, and duration, some specificity in terms of plans, no associated intent, good self-control, limited dysphoria/symptomatology, some risk factors present, and identifiable protective factors, including available and accessible social support.  PLAN OF CARE: Supportive approach/coping skills/relapse prevention                               Identify detox needs/reassess and address the co morbidities  I certify that inpatient services furnished can reasonably be expected to improve the patient's condition.  Candon Caras A 02/05/2013, 5:39 PM

## 2013-02-06 DIAGNOSIS — F10239 Alcohol dependence with withdrawal, unspecified: Secondary | ICD-10-CM

## 2013-02-06 DIAGNOSIS — F1020 Alcohol dependence, uncomplicated: Secondary | ICD-10-CM

## 2013-02-06 LAB — GLUCOSE, CAPILLARY
Glucose-Capillary: 117 mg/dL — ABNORMAL HIGH (ref 70–99)
Glucose-Capillary: 263 mg/dL — ABNORMAL HIGH (ref 70–99)

## 2013-02-06 MED ORDER — LISINOPRIL 20 MG PO TABS: 20.0000 mg | ORAL_TABLET | Freq: Every day | ORAL | Status: DC

## 2013-02-06 MED ORDER — VITAMIN B-6 100 MG PO TABS: 100.0000 mg | ORAL_TABLET | Freq: Every day | ORAL | Status: DC

## 2013-02-06 MED ORDER — ADULT MULTIVITAMIN W/MINERALS CH: 1.0000 | ORAL_TABLET | Freq: Every day | ORAL | Status: DC

## 2013-02-06 MED ORDER — TRAZODONE HCL 50 MG PO TABS: 50.0000 mg | ORAL_TABLET | Freq: Every evening | ORAL | Status: DC | PRN

## 2013-02-06 MED ORDER — PANTOPRAZOLE SODIUM 40 MG PO TBEC: 40.0000 mg | DELAYED_RELEASE_TABLET | Freq: Two times a day (BID) | ORAL | Status: DC

## 2013-02-06 MED ORDER — DICLOFENAC SODIUM 1 % TD GEL: 2.0000 g | Freq: Every day | TRANSDERMAL | Status: DC

## 2013-02-06 MED ORDER — INSULIN GLARGINE 100 UNIT/ML ~~LOC~~ SOLN: 45.0000 [IU] | Freq: Every day | SUBCUTANEOUS | Status: DC

## 2013-02-06 MED ORDER — VITAMIN D 1000 UNITS PO TABS: 1000.0000 [IU] | ORAL_TABLET | Freq: Every day | ORAL | Status: DC

## 2013-02-06 MED ORDER — EUCERIN EX CREA: 1.0000 "application " | TOPICAL_CREAM | CUTANEOUS | Status: DC | PRN

## 2013-02-06 NOTE — Progress Notes (Signed)
Patient ID: [name], male   DOB: 21-May-1956, 57 y.o.   MRN: [id number] He has been discharged home and was picked up by his wife. He voiced understanding of discharge  Teaching about medication and follow up plan. He was going to see his NP this after noon. He denies thoughts of SI and all blengings were taken home with him.

## 2013-02-06 NOTE — BHH Suicide Risk Assessment (Signed)
Suicide Risk Assessment  Discharge Assessment     Demographic Factors:  Male and Caucasian  Mental Status Per Nursing Assessment::   On Admission:  NA  Current Mental Status by Physician: In full contact with reality. There is no evidence of acute withdrawal. No suicidal ideas, plans or intent. His mood is euthymic, his affect is appropriate. He is willing and motivated to pursue further outpatient treatment. States that his brother coming is always a stressor and a trigger for his relapse. His brother is gone now. He has few medical things going on and he plans to address them this afternoon or tomorrow with his PCP   Loss Factors: Decline in physical health  Historical Factors: NA  Risk Reduction Factors:   Sense of responsibility to family, Living with another person, especially a relative and Positive social support  Continued Clinical Symptoms:  Bipolar Disorder:   Depressive phase Alcohol/Substance Abuse/Dependencies  Cognitive Features That Contribute To Risk:  Closed-mindedness Polarized thinking Thought constriction (tunnel vision)    Suicide Risk:  Minimal: No identifiable suicidal ideation.  Patients presenting with no risk factors but with morbid ruminations; may be classified as minimal risk based on the severity of the depressive symptoms  Discharge Diagnoses:   AXIS I:  Alcohol Dependence, Cocaine Abuse, Mood Disorder NOS AXIS II:  Deferred AXIS III:   Past Medical History  Diagnosis Date  . Neuropathy   . Diabetes mellitus   . Bipolar affect, depressed   . Hypertension   . Arthritis   . Stroke     Mini stroke about 72yrs ago  . Cirrhosis   . Alcohol abuse   . Chronic pain   . Cocaine abuse   . Muscle spasm     both legs   AXIS IV:  other psychosocial or environmental problems AXIS V:  61-70 mild symptoms  Plan Of Care/Follow-up recommendations:  Activity:  as tolerated Diet:  regular Follow up outpatient basis Is patient on multiple  antipsychotic therapies at discharge:  No   Has Patient had three or more failed trials of antipsychotic monotherapy by history:  No  Recommended Plan for Multiple Antipsychotic Therapies: NA  Lowella Kindley A 02/06/2013, 12:20 PM

## 2013-02-06 NOTE — Tx Team (Signed)
Interdisciplinary Treatment Plan Update (Adult)  Date: 02/06/2013  Time Reviewed:  9:45 AM  Progress in Treatment: Attending groups: Yes Participating in groups:  Yes Taking medication as prescribed:  Yes Tolerating medication:  Yes Family/Significant othe contact made: CSW will attempt today Patient understands diagnosis:  Yes Discussing patient identified problems/goals with staff:  Yes Medical problems stabilized or resolved:  Yes Denies suicidal/homicidal ideation: Yes Issues/concerns per patient self-inventory:  Yes Other:  New problem(s) identified: N/A  Discharge Plan or Barriers: Pt will follow up at [facility] for medication management and therapy.     Reason for Continuation of Hospitalization: Stable to d/c  Comments: N/A  Estimated length of stay: D/C today  For review of initial/current patient goals, please see plan of care.  Attendees: Patient:     Family:     Physician:  Dr. Dub Mikes 02/06/2013 10:35 AM   Nursing:   Lowella Grip, RN 02/06/2013 10:35 AM   Clinical Social Worker:  Reyes Ivan, LCSWA 02/06/2013 10:35 AM   Other: Onnie Boer, RN case manager 02/06/2013 10:35 AM   Other:  Trula Slade, LCSWA 02/06/2013 10:35 AM   Other:  Serena Colonel, NP 02/06/2013 10:35 AM   Other:  Roswell Miners, RN 02/06/2013 10:36 AM   Other: Onnie Boer, RN case manager 02/06/2013 10:36 AM   Other: Tomasita Morrow, care coordination 02/06/2013 10:37 AM   Other:    Other:    Other:    Other:     Scribe for Treatment Team:   Carmina Miller, 02/06/2013 , 10:35 AM

## 2013-02-06 NOTE — BHH Group Notes (Signed)
[facility] LCSW Aftercare Discharge Planning Group Note   02/06/2013  8:45 AM  Participation Quality:  Alert and Appropriate   Mood/Affect:  Appropriate  Depression Rating:  5  Anxiety Rating:  5  Thoughts of Suicide:  Pt denies SI/HI  Will you contract for safety?   Yes  Current AVH:  Pt denies  Plan for Discharge/Comments:  Pt attended discharge planning group and actively participated in group.  CSW provided pt with today's workbook.  Pt reports getting off track but stopping outpatient treatment.  Pt will return home in [place].  Pt states that he follows up at [facility].  CSW attempted to schedule pt's follow up there but was unable to due to pt being inactive for 2 years.  CSW informed pt of this and requested pt to call and work on this referral, as CSW is unable to do so.  No further needs voiced by pt at this time.    Transportation Means: Pt reports access to transportation - reports wife will pick pt up  Supports: No supports mentioned at this time  [name], LCSWA 02/06/2013 9:51 AM

## 2013-02-06 NOTE — Progress Notes (Signed)
[facility] Adult Case Management Discharge Plan :  Will you be returning to the same living situation after discharge: Yes,  returning home At discharge, do you have transportation home?:Yes,  fiance will pick pt up Do you have the ability to pay for your medications:Yes,  access to meds  Release of information consent forms completed and in the chart;  Patient's signature needed at discharge.  Patient to Follow up at: Follow-up Information   Follow up with [facility]. Caprice Kluver, NP for medication management - patient verbalizes plan to arrange his own follow up)    Contact information:   [address]  [place], [place] [address] Phone: [phone] Fax: [phone]      Patient denies SI/HI:   Yes,  denies SI/HI    Safety Planning and Suicide Prevention discussed:  Yes,  discussed with pt and pt's fiance.  See suicide prevention education note  CSW attempted to arrange pt's follow up with [facility] but was unable to due to pt having a balance.  Pt verbalizes only wanting to follow up there and plans to arrange his own follow up upon d/c.  CSW will also provide pt with info on [facility], medication management and therapy offered at [facility] low cost.  No further needs voiced by pt at this time.    [name] 02/06/2013, 11:10 AM

## 2013-02-06 NOTE — Discharge Summary (Signed)
Physician Discharge Summary Note  Patient:  [name] is an 57 y.o., male MRN:  [id number] DOB:  1956/05/05 Patient phone:  [phone] (home)  Patient address:   [address]  [place] [address],   Date of Admission:  02/05/2013 Date of Discharge: 02/06/13  Reason for Admission: Alcohol dependence  Discharge Diagnoses: Principal Problem:   Alcohol dependence Active Problems:   Bipolar affect, depressed  Review of Systems  Constitutional: Negative.   HENT: Negative.   Eyes: Negative.   Respiratory: Negative.   Cardiovascular: Negative.   Gastrointestinal: Negative.   Genitourinary: Negative.   Musculoskeletal: Negative.   Skin:       Abrasions to inner right arm   Neurological: Negative.   Endo/Heme/Allergies: Negative.   Psychiatric/Behavioral: Positive for substance abuse (alcohol dependence, Cocaine abuse). Negative for depression (stable), suicidal ideas, hallucinations and memory loss. The patient is nervous/anxious (stable) and has insomnia (Stable).     DSM5:  Schizophrenia Disorders:  NA Obsessive-Compulsive Disorders:  NA Trauma-Stressor Disorders:  NA Substance/Addictive Disorders:  Alcohol Withdrawal (291.81), Alcohol dependence, Cocaine dependence Depressive Disorders:  Bipolar affective disorder, depressed, moderate  Axis Diagnosis:   AXIS I:  Alcohol Withdrawal (291.81), Alcohol dependence, Cocaine, Bipolar affective disorder, depressed , Cocaine dependence AXIS II:  Deferred AXIS III:   Past Medical History  Diagnosis Date  . Neuropathy   . Diabetes mellitus   . Bipolar affect, depressed   . Hypertension   . Arthritis   . Stroke     Mini stroke about 2yrs ago  . Cirrhosis   . Alcohol abuse   . Chronic pain   . Cocaine abuse   . Muscle spasm     both legs   AXIS IV:  other psychosocial or environmental problems and Alcoholism, Cocaine dependence AXIS V:  63  Level of Care:  OP  Hospital Course:  This is a 57 year old  Caucasian male. Admitted to [facility] from the [facility] with complaints of alcohol intoxication requesting detox. Patient reports, "I've been drinking a lot of alcohol and using cocaine too. I have been doing this x 2 weeks to calm myself. Whenever I'm out of my medicines, I drink and use cocaine to help myself. I drink about 6 packs daily. I use cocaine probably once a month. I also have bipolar disorder, diagnosed 6 years ago.Somedays, I will get so confused that I don't even realize what was going on, other times, I don't remember much, I mean I literally forgets. I have had 2 separate psychiatrists in the past. They all moved away. I see my primary care physician, Dr. August Luz. He prescribed me Xanax pills. The Xanax works for my anxiety. I have bad anxiety, bad mood swings, bad vision and I'm disabled. I gave up my driver's license because I have bad vision. The longest that I have been sober was 8 years, and that was about 3 years ago. Right, I have the shakes, bad anxiety, bad depression and I feel sick to my stomach. My anxiety right now is at #4, depression #10"  [name] stay in this hospital was rather very brief. Although with toxicology reports showing blood alcohol level of 281 and (+) cocaine on the UDS reports, patient was not showing and or having any withdrawal symptoms upon arrival and or throughout his brief hospital stay. Although, during his initial admission assessment, he reported feeling shaky and sick to his stomach. He presented on admission with the diagnosis/history of liver cirrhosis,  as a result, he did not receive any librium detoxification treatment protocol. He was started on Ativan detoxification detoxification treatment protocol on a tapering dose to safe guard his remaining liver functions. [name] did require mood stabilization and crisis management to bring him back to his most possible stable state because he reported feeling very depressed, anxious with  history of bipolar affective disorder and has been out of his routine psychotropic medications x 2 weeks. He also was resumed on his other medication management and monitoring for his other medical issues and concerns.   [name] met with the MD this am and requested to be discharged to his home. He reports improved mood, absence of withdrawal symptoms and reduction of depressive symptoms. He said that he does have a follow-up appointment with his doctor today to re-initiate his routine medications. He is currently being discharged to follow-up care with his primary care MD as he has indicated. He obviously is responsible for scheduling this appointment time. However, he has been referred to and provided the contact information for the [facility] clinic here in [place], with [place]. Upon discharge, patient adamantly denies any suicidal, homicidal ideations, auditory, visual hallucinations, delusional thoughts, paranoia and or withdrawal symptoms. He was provided 14 days worth supply samples of his [facility] discharge medication (Trazodone). He left [facility] with all personal belongings in no apparent distress. Transportation per fiance.  Consults:  psychiatry  Significant Diagnostic Studies:  labs: CBC with diff, CMP, UDS, Toxicology tests, U/A  Discharge Vitals:   Blood pressure 135/79, pulse 99, temperature 97.9 F (36.6 C), temperature source Oral, resp. rate 20, height 5\' 7"  (1.702 m), weight 105.688 kg (233 lb), SpO2 96.00%. Body mass index is 36.48 kg/(m^2). Lab Results:   Results for orders placed during the hospital encounter of 02/05/13 (from the past 72 hour(s))  GLUCOSE, CAPILLARY     Status: Abnormal   Collection Time    02/05/13  2:08 AM      Result Value Range   Glucose-Capillary 187 (*) 70 - 99 mg/dL  GLUCOSE, CAPILLARY     Status: Abnormal   Collection Time    02/05/13  6:26 AM      Result Value Range   Glucose-Capillary 131 (*) 70 - 99 mg/dL  GLUCOSE, CAPILLARY      Status: Abnormal   Collection Time    02/05/13 11:29 AM      Result Value Range   Glucose-Capillary 101 (*) 70 - 99 mg/dL  GLUCOSE, CAPILLARY     Status: Abnormal   Collection Time    02/05/13  4:58 PM      Result Value Range   Glucose-Capillary 139 (*) 70 - 99 mg/dL  GLUCOSE, CAPILLARY     Status: Abnormal   Collection Time    02/05/13  9:02 PM      Result Value Range   Glucose-Capillary 169 (*) 70 - 99 mg/dL  GLUCOSE, CAPILLARY     Status: Abnormal   Collection Time    02/06/13  6:20 AM      Result Value Range   Glucose-Capillary 117 (*) 70 - 99 mg/dL    Physical Findings: AIMS: Facial and Oral Movements Muscles of Facial Expression: None, normal Lips and Perioral Area: None, normal Jaw: None, normal Tongue: None, normal,Extremity Movements Upper (arms, wrists, hands, fingers): None, normal Lower (legs, knees, ankles, toes): None, normal, Trunk Movements Neck, shoulders, hips: None, normal, Overall Severity Severity of abnormal movements (highest score from questions above): None, normal  Incapacitation due to abnormal movements: None, normal Patient's awareness of abnormal movements (rate only patient's report): No Awareness, Dental Status Current problems with teeth and/or dentures?: No Does patient usually wear dentures?: No  CIWA:  CIWA-Ar Total: 0 COWS:     Psychiatric Specialty Exam: See Psychiatric Specialty Exam and Suicide Risk Assessment completed by Attending Physician prior to discharge.  Discharge destination:  Home  Is patient on multiple antipsychotic therapies at discharge:  No   Has Patient had three or more failed trials of antipsychotic monotherapy by history:  No  Recommended Plan for Multiple Antipsychotic Therapies: NA     Medication List    STOP taking these medications       ALPRAZolam 0.5 MG tablet  Commonly known as:  XANAX     HYDROcodone-acetaminophen 10-325 MG per tablet  Commonly known as:  NORCO      TAKE these medications      Indication   cholecalciferol 1000 UNITS tablet  Commonly known as:  VITAMIN D  Take 1 tablet (1,000 Units total) by mouth at bedtime. For bone health   Indication:  Bone health     diclofenac sodium 1 % Gel  Commonly known as:  VOLTAREN  Apply 2 g topically daily. Apply to back and legs: For osteoarthritic pain.   Indication:  Joint Damage causing Pain and Loss of Function     eucerin cream  Apply 1 application topically as needed for dry skin (apply to legs). For dryness   Indication:  Dryness     insulin glargine 100 UNIT/ML injection  Commonly known as:  LANTUS  Inject 0.45 mLs (45 Units total) into the skin at bedtime. For diabetes management   Indication:  Type 2 Diabetes     lisinopril 20 MG tablet  Commonly known as:  PRINIVIL,ZESTRIL  Take 1 tablet (20 mg total) by mouth daily. For high blood pressure management   Indication:  High Blood Pressure     multivitamin with minerals Tabs tablet  Take 1 tablet by mouth daily. (Resume with the vitamins that you have at home): For low vitamin   Indication:  Low vitamin     pantoprazole 40 MG tablet  Commonly known as:  PROTONIX  Take 1 tablet (40 mg total) by mouth 2 (two) times daily. For acid reflux   Indication:  Gastroesophageal Reflux Disease     pyridOXINE 100 MG tablet  Commonly known as:  VITAMIN B-6  Take 1 tablet (100 mg total) by mouth daily. For low B-6 Vitamin supplement   Indication:  Low B-6 supplement     traZODone 50 MG tablet  Commonly known as:  DESYREL  Take 1 tablet (50 mg total) by mouth at bedtime as needed and may repeat dose one time if needed for sleep.   Indication:  Trouble Sleeping       Follow-up Information   Follow up with [facility]. Misty Stanley Polous for medication management - patient verbalizes plan to arrange his own follow up)    Contact information:   [address]  [place], [place] [address] Phone: [phone] Fax: [phone]     Follow-up recommendations:  Activity:  As tolerated Diet: As recommended by your primary care doctor. Keep all scheduled follow-up appointments as recommended.  Continue to work your relapse prevention plan Comments: Take all your medications as prescribed by your mental healthcare provider. Report any adverse effects and or reactions from your medicines to your outpatient provider promptly. Patient is instructed  and cautioned to not engage in alcohol and or illegal drug use while on prescription medicines. In the event of worsening symptoms, patient is instructed to call the crisis hotline, 911 and or go to the nearest ED for appropriate evaluation and treatment of symptoms. Follow-up with your primary care provider for your other medical issues, concerns and or health care needs.   Total Discharge Time:  Greater than 30 minutes.  Signed: Sanjuana Kava, PMHNP-BC 02/06/2013, 10:58 AM Agree with assessment and plan Reymundo Poll. Dub Mikes, M.D.

## 2013-02-06 NOTE — BHH Suicide Risk Assessment (Signed)
BHH INPATIENT:  Family/Significant Other Suicide Prevention Education  Suicide Prevention Education:  Education Completed; [name] - fiance [phone]),  (name of family member/significant other) has been identified by the patient as the family member/significant other with whom the patient will be residing, and identified as the person(s) who will aid the patient in the event of a mental health crisis (suicidal ideations/suicide attempt).  With written consent from the patient, the family member/significant other has been provided the following suicide prevention education, prior to the and/or following the discharge of the patient.  The suicide prevention education provided includes the following:  Suicide risk factors  Suicide prevention and interventions  National Suicide Hotline telephone number  [facility] assessment telephone number  [facility] Emergency Assistance 911  [facility] and/or Residential Mobile Crisis Unit telephone number  Request made of family/significant other to:  Remove weapons (e.g., guns, rifles, knives), all items previously/currently identified as safety concern.    Remove drugs/medications (over-the-counter, prescriptions, illicit drugs), all items previously/currently identified as a safety concern.  The family member/significant other verbalizes understanding of the suicide prevention education information provided.  The family member/significant other agrees to remove the items of safety concern listed above.  Carmina Miller 02/06/2013, 11:06 AM

## 2013-02-08 NOTE — Care Management Utilization Note (Signed)
Per State Regulation 482.30  The chart was reviewed for necessity with respect to the patient's Admission/ Duration of stay. Admitted 02/06/2013  Next Review Date: DC 02/07/13  Lacinda Axon, RN, BSN

## 2013-02-11 NOTE — Progress Notes (Signed)
Patient Discharge Instructions:  After Visit Summary (AVS):   Faxed to:  02/11/13 Discharge Summary Note:   Faxed to:  02/11/13 Psychiatric Admission Assessment Note:   Faxed to:  02/11/13 Suicide Risk Assessment - Discharge Assessment:   Faxed to:  02/11/13 Faxed/Sent to the Next Level Care provider:  02/11/13 Faxed to [facility] @ [phone]  Jerelene Redden, 02/11/2013, 3:09 PM

## 2013-03-09 ENCOUNTER — Encounter (HOSPITAL_COMMUNITY): Payer: Self-pay | Admitting: Emergency Medicine

## 2013-03-09 ENCOUNTER — Emergency Department (HOSPITAL_COMMUNITY)
Admission: EM | Admit: 2013-03-09 | Discharge: 2013-03-09 | Disposition: A | Discharge status: Home / Self Care | Payer: Medicare PPO | Attending: Emergency Medicine | Admitting: Emergency Medicine

## 2013-03-09 DIAGNOSIS — G8929 Other chronic pain: Secondary | ICD-10-CM | POA: Insufficient documentation

## 2013-03-09 DIAGNOSIS — M129 Arthropathy, unspecified: Secondary | ICD-10-CM | POA: Insufficient documentation

## 2013-03-09 DIAGNOSIS — F329 Major depressive disorder, single episode, unspecified: Secondary | ICD-10-CM

## 2013-03-09 DIAGNOSIS — I10 Essential (primary) hypertension: Secondary | ICD-10-CM | POA: Insufficient documentation

## 2013-03-09 DIAGNOSIS — Z8719 Personal history of other diseases of the digestive system: Secondary | ICD-10-CM | POA: Insufficient documentation

## 2013-03-09 DIAGNOSIS — G589 Mononeuropathy, unspecified: Secondary | ICD-10-CM | POA: Insufficient documentation

## 2013-03-09 DIAGNOSIS — Z79899 Other long term (current) drug therapy: Secondary | ICD-10-CM | POA: Insufficient documentation

## 2013-03-09 DIAGNOSIS — Z794 Long term (current) use of insulin: Secondary | ICD-10-CM | POA: Insufficient documentation

## 2013-03-09 DIAGNOSIS — F3130 Bipolar disorder, current episode depressed, mild or moderate severity, unspecified: Secondary | ICD-10-CM | POA: Insufficient documentation

## 2013-03-09 DIAGNOSIS — Z8673 Personal history of transient ischemic attack (TIA), and cerebral infarction without residual deficits: Secondary | ICD-10-CM | POA: Insufficient documentation

## 2013-03-09 DIAGNOSIS — E119 Type 2 diabetes mellitus without complications: Secondary | ICD-10-CM | POA: Insufficient documentation

## 2013-03-09 DIAGNOSIS — F17200 Nicotine dependence, unspecified, uncomplicated: Secondary | ICD-10-CM | POA: Insufficient documentation

## 2013-03-09 DIAGNOSIS — F1021 Alcohol dependence, in remission: Secondary | ICD-10-CM | POA: Insufficient documentation

## 2013-03-09 MED ORDER — HYDROCODONE-ACETAMINOPHEN 5-325 MG PO TABS
2.0000 | ORAL_TABLET | Freq: Once | ORAL | Status: AC
  Administered 2013-03-09: 2 via ORAL
  Filled 2013-03-09: qty 2

## 2013-03-09 MED ORDER — LORAZEPAM 1 MG PO TABS
1.0000 mg | ORAL_TABLET | Freq: Once | ORAL | Status: AC
  Administered 2013-03-09: 1 mg via ORAL
  Filled 2013-03-09 (×2): qty 1
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
## 2013-03-11 ENCOUNTER — Inpatient Hospital Stay (HOSPITAL_COMMUNITY)
Admission: EM | Admit: 2013-03-11 | Discharge: 2013-03-14 | DRG: 369 | Disposition: A | Discharge status: Home / Self Care | Payer: Medicare PPO | Attending: Internal Medicine | Admitting: Internal Medicine

## 2013-03-11 ENCOUNTER — Emergency Department (HOSPITAL_COMMUNITY): Payer: Medicare PPO

## 2013-03-11 ENCOUNTER — Encounter (HOSPITAL_COMMUNITY): Payer: Self-pay | Admitting: Emergency Medicine

## 2013-03-11 DIAGNOSIS — K269 Duodenal ulcer, unspecified as acute or chronic, without hemorrhage or perforation: Secondary | ICD-10-CM | POA: Diagnosis present

## 2013-03-11 DIAGNOSIS — F32A Depression, unspecified: Secondary | ICD-10-CM | POA: Diagnosis present

## 2013-03-11 DIAGNOSIS — K7460 Unspecified cirrhosis of liver: Secondary | ICD-10-CM | POA: Diagnosis present

## 2013-03-11 DIAGNOSIS — K259 Gastric ulcer, unspecified as acute or chronic, without hemorrhage or perforation: Secondary | ICD-10-CM | POA: Diagnosis present

## 2013-03-11 DIAGNOSIS — Z8673 Personal history of transient ischemic attack (TIA), and cerebral infarction without residual deficits: Secondary | ICD-10-CM

## 2013-03-11 DIAGNOSIS — F329 Major depressive disorder, single episode, unspecified: Secondary | ICD-10-CM | POA: Diagnosis present

## 2013-03-11 DIAGNOSIS — D696 Thrombocytopenia, unspecified: Secondary | ICD-10-CM | POA: Diagnosis present

## 2013-03-11 DIAGNOSIS — F3289 Other specified depressive episodes: Secondary | ICD-10-CM | POA: Diagnosis present

## 2013-03-11 DIAGNOSIS — I8501 Esophageal varices with bleeding: Principal | ICD-10-CM | POA: Diagnosis present

## 2013-03-11 DIAGNOSIS — M129 Arthropathy, unspecified: Secondary | ICD-10-CM | POA: Diagnosis present

## 2013-03-11 DIAGNOSIS — F1010 Alcohol abuse, uncomplicated: Secondary | ICD-10-CM | POA: Diagnosis present

## 2013-03-11 DIAGNOSIS — E119 Type 2 diabetes mellitus without complications: Secondary | ICD-10-CM | POA: Diagnosis present

## 2013-03-11 DIAGNOSIS — Z720 Tobacco use: Secondary | ICD-10-CM | POA: Diagnosis present

## 2013-03-11 DIAGNOSIS — K922 Gastrointestinal hemorrhage, unspecified: Secondary | ICD-10-CM | POA: Diagnosis present

## 2013-03-11 DIAGNOSIS — F1020 Alcohol dependence, uncomplicated: Secondary | ICD-10-CM | POA: Diagnosis present

## 2013-03-11 DIAGNOSIS — E7220 Disorder of urea cycle metabolism, unspecified: Secondary | ICD-10-CM | POA: Diagnosis present

## 2013-03-11 DIAGNOSIS — F17200 Nicotine dependence, unspecified, uncomplicated: Secondary | ICD-10-CM | POA: Diagnosis present

## 2013-03-11 DIAGNOSIS — K766 Portal hypertension: Secondary | ICD-10-CM | POA: Diagnosis present

## 2013-03-11 DIAGNOSIS — K7030 Alcoholic cirrhosis of liver without ascites: Secondary | ICD-10-CM | POA: Diagnosis present

## 2013-03-11 DIAGNOSIS — I10 Essential (primary) hypertension: Secondary | ICD-10-CM | POA: Diagnosis present

## 2013-03-11 LAB — OCCULT BLOOD, POC DEVICE: Fecal Occult Bld: POSITIVE — AB

## 2013-03-11 LAB — CBC WITH DIFFERENTIAL/PLATELET
Hemoglobin: 11.3 g/dL — ABNORMAL LOW (ref 13.0–17.0)
Lymphocytes Relative: 14 % (ref 12–46)
Lymphs Abs: 0.6 10*3/uL — ABNORMAL LOW (ref 0.7–4.0)
Monocytes Relative: 11 % (ref 3–12)
Neutro Abs: 3.1 10*3/uL (ref 1.7–7.7)
Neutrophils Relative %: 73 % (ref 43–77)
Platelets: 48 10*3/uL — ABNORMAL LOW (ref 150–400)
RBC: 3.53 MIL/uL — ABNORMAL LOW (ref 4.22–5.81)
WBC: 4.3 10*3/uL (ref 4.0–10.5)

## 2013-03-11 LAB — COMPREHENSIVE METABOLIC PANEL
ALT: 30 U/L (ref 0–53)
Alkaline Phosphatase: 163 U/L — ABNORMAL HIGH (ref 39–117)
BUN: 12 mg/dL (ref 6–23)
Chloride: 98 mEq/L (ref 96–112)
GFR calc Af Amer: >90 mL/min (ref >90)
Glucose, Bld: 247 mg/dL — ABNORMAL HIGH (ref 70–99)
Potassium: 3.7 mEq/L (ref 3.5–5.1)
Sodium: 134 mEq/L — ABNORMAL LOW (ref 135–145)
Total Bilirubin: 3.9 mg/dL — ABNORMAL HIGH (ref 0.3–1.2)
Total Protein: 6.6 g/dL (ref 6.0–8.3)

## 2013-03-11 LAB — RAPID URINE DRUG SCREEN, HOSP PERFORMED
Amphetamines: NOT DETECTED
Barbiturates: NOT DETECTED
Benzodiazepines: POSITIVE — AB
Cocaine: NOT DETECTED
Opiates: NOT DETECTED

## 2013-03-11 LAB — AMMONIA: Ammonia: 71 umol/L — ABNORMAL HIGH (ref 11–60)

## 2013-03-11 LAB — TYPE AND SCREEN: ABO/RH(D): O POS

## 2013-03-11 MED ORDER — SODIUM CHLORIDE 0.9 % IV SOLN
80.0000 mg | Freq: Once | INTRAVENOUS | Status: AC
  Administered 2013-03-11: 80 mg via INTRAVENOUS
  Filled 2013-03-11: qty 80

## 2013-03-11 MED ORDER — LORAZEPAM 2 MG/ML IJ SOLN
1.0000 mg | Freq: Once | INTRAMUSCULAR | Status: AC
  Administered 2013-03-11: 1 mg via INTRAVENOUS
  Filled 2013-03-11: qty 1

## 2013-03-11 MED ORDER — SODIUM CHLORIDE 0.9 % IV SOLN
1000.0000 mL | Freq: Once | INTRAVENOUS | Status: AC
  Administered 2013-03-11: 1000 mL via INTRAVENOUS

## 2013-03-11 MED ORDER — ONDANSETRON HCL 4 MG/2ML IJ SOLN
4.0000 mg | Freq: Once | INTRAMUSCULAR | Status: AC
  Administered 2013-03-11: 4 mg via INTRAVENOUS
  Filled 2013-03-11: qty 2

## 2013-03-11 MED ORDER — PANTOPRAZOLE SODIUM 40 MG IV SOLR: 40.0000 mg | Freq: Two times a day (BID) | INTRAVENOUS | Status: DC

## 2013-03-11 MED ORDER — LORAZEPAM 2 MG/ML IJ SOLN
2.0000 mg | Freq: Once | INTRAMUSCULAR | Status: AC
  Administered 2013-03-11: 2 mg via INTRAVENOUS
  Filled 2013-03-11: qty 1

## 2013-03-11 MED ORDER — LORAZEPAM 2 MG/ML IJ SOLN
0.0000 mg | Freq: Four times a day (QID) | INTRAMUSCULAR | Status: AC
  Administered 2013-03-12: 1 mg via INTRAVENOUS
  Administered 2013-03-12: 2 mg via INTRAVENOUS
  Administered 2013-03-12: 1 mg via INTRAVENOUS
  Administered 2013-03-12: 2 mg via INTRAVENOUS
  Administered 2013-03-13: 1 mg via INTRAVENOUS
  Administered 2013-03-13: 2 mg via INTRAVENOUS
  Filled 2013-03-11 (×7): qty 1

## 2013-03-11 MED ORDER — SODIUM CHLORIDE 0.9 % IV SOLN
INTRAVENOUS | Status: DC
  Administered 2013-03-12 – 2013-03-13 (×2): via INTRAVENOUS

## 2013-03-11 MED ORDER — SODIUM CHLORIDE 0.9 % IV SOLN
1000.0000 mL | INTRAVENOUS | Status: DC
  Administered 2013-03-11: 1000 mL via INTRAVENOUS

## 2013-03-11 MED ORDER — THIAMINE HCL 100 MG/ML IJ SOLN
Freq: Once | INTRAVENOUS | Status: AC
  Administered 2013-03-11: 23:00:00 via INTRAVENOUS
  Filled 2013-03-11: qty 1000

## 2013-03-11 MED ORDER — SODIUM CHLORIDE 0.9 % IV SOLN
8.0000 mg/h | INTRAVENOUS | Status: DC
  Administered 2013-03-11 – 2013-03-13 (×4): 8 mg/h via INTRAVENOUS
  Filled 2013-03-11 (×9): qty 80

## 2013-03-11 MED ORDER — SODIUM CHLORIDE 0.9 % IV SOLN
50.0000 ug/h | INTRAVENOUS | Status: DC
  Administered 2013-03-11 – 2013-03-13 (×4): 50 ug/h via INTRAVENOUS
  Filled 2013-03-11 (×9): qty 1

## 2013-03-11 MED ORDER — LORAZEPAM 2 MG/ML IJ SOLN
1.0000 mg | Freq: Four times a day (QID) | INTRAMUSCULAR | Status: DC | PRN
  Administered 2013-03-11: 1 mg via INTRAVENOUS

## 2013-03-11 MED ORDER — LORAZEPAM 2 MG/ML IJ SOLN
0.0000 mg | Freq: Two times a day (BID) | INTRAMUSCULAR | Status: DC
  Administered 2013-03-13: 1 mg via INTRAVENOUS
  Filled 2013-03-11: qty 1

## 2013-03-11 MED ORDER — INSULIN ASPART 100 UNIT/ML ~~LOC~~ SOLN
0.0000 [IU] | SUBCUTANEOUS | Status: DC
  Administered 2013-03-12: 3 [IU] via SUBCUTANEOUS
  Administered 2013-03-12 (×3): 2 [IU] via SUBCUTANEOUS
  Administered 2013-03-12: 5 [IU] via SUBCUTANEOUS
  Administered 2013-03-12 – 2013-03-13 (×2): 2 [IU] via SUBCUTANEOUS

## 2013-03-11 MED ORDER — LORAZEPAM 0.5 MG PO TABS: 1.0000 mg | ORAL_TABLET | Freq: Four times a day (QID) | ORAL | Status: DC | PRN

## 2013-03-11 MED ORDER — SODIUM CHLORIDE 0.9 % IV BOLUS (SEPSIS)
1000.0000 mL | Freq: Once | INTRAVENOUS | Status: AC
  Administered 2013-03-11: 1000 mL via INTRAVENOUS

## 2013-03-11 MED ORDER — SODIUM CHLORIDE 0.9 % IJ SOLN
3.0000 mL | Freq: Two times a day (BID) | INTRAMUSCULAR | Status: DC
  Administered 2013-03-11 – 2013-03-13 (×4): 3 mL via INTRAVENOUS

## 2013-03-11 NOTE — ED Notes (Signed)
Pt states around 3 days ago he began having some nausea and could not eat. Pt began noticing bleeding in his stool this morning and began having coffee ground emesis. Pt has hx of alcohol abuse and has been trying to cut back on intake. Pt has not been able to keep anything down so he has been unable to take medications. Vitals 124/58, tachycardia around 120s and cbg 231.

## 2013-03-11 NOTE — H&P (Signed)
[facility] History and Physical  [name]:[id number] DOB: 06-Nov-1955 DOA: 03/11/2013  Referring physician: er PCP: Lonia Blood, MD  Specialists: GI  Chief Complaint: vomiting blood  HPI: [name] is a 57 y.o. male  Who comes in after being seen for suicical ideations 2 days ago in the ER.  Now comes back with Nasuea and vomiting of "coffee-ground" emesis.  He also noticed blood in his stools this AM.  He has been drinking alcohol and was positive in the ER.  Per old records he has had an ulcer in the past - he said this presentation is very similar to his past one and he required 5 units of plasma/blood  -usually drinks heavily but today has not been able to secondary to vomiting  In the ER, he was tachy and Hgb was slightly low at 11 He was heme positive. ER doc to call GI consult  Review of Systems: all systems reviewed, negative unless stated above   Past Medical History  Diagnosis Date  . Neuropathy   . Diabetes mellitus   . Bipolar affect, depressed   . Hypertension   . Arthritis   . Stroke     Mini stroke about 10yrs ago  . Cirrhosis   . Alcohol abuse   . Chronic pain   . Cocaine abuse   . Muscle spasm     both legs   Past Surgical History  Procedure Laterality Date  . Fracture surgery      Leg and arm 60yrs ago  . Esophagogastroduodenoscopy  04/04/2012    Procedure: ESOPHAGOGASTRODUODENOSCOPY (EGD);  Surgeon: Hilarie Fredrickson, MD;  Location: [facility] ENDOSCOPY;  Service: Endoscopy;  Laterality: N/A;  . Eye surgery  8 months ago both eyes   Social History:  reports that he has been smoking Cigarettes.  He has a 30 pack-year smoking history. He has never used smokeless tobacco. He reports that he drinks alcohol. He reports that he uses illicit drugs (Cocaine).   No Known Allergies  Family History  Problem Relation Age of Onset  . Hypotension Mother      Prior to Admission medications   Medication Sig Start Date End Date Taking? Authorizing Provider   ALPRAZolam Prudy Feeler) 1 MG tablet Take 1 mg by mouth 3 (three) times daily as needed for anxiety.   Yes Historical Provider, MD  amitriptyline (ELAVIL) 50 MG tablet Take 50 mg by mouth at bedtime.   Yes Historical Provider, MD  cholecalciferol (VITAMIN D) 1000 UNITS tablet Take 1 tablet (1,000 Units total) by mouth at bedtime. For bone health 02/06/13  Yes Sanjuana Kava, NP  diclofenac sodium (VOLTAREN) 1 % GEL Apply 2 g topically daily. Apply to back and legs: For osteoarthritic pain. 02/06/13  Yes Sanjuana Kava, NP  gabapentin (NEURONTIN) 100 MG capsule Take 100 mg by mouth 3 (three) times daily.   Yes Historical Provider, MD  HYDROcodone-acetaminophen (NORCO/VICODIN) 5-325 MG per tablet Take 2 tablets by mouth every 6 (six) hours as needed for pain.   Yes Historical Provider, MD  insulin glargine (LANTUS) 100 UNIT/ML injection Inject 0.45 mLs (45 Units total) into the skin at bedtime. For diabetes management 02/06/13  Yes Sanjuana Kava, NP  lisinopril (PRINIVIL,ZESTRIL) 20 MG tablet Take 1 tablet (20 mg total) by mouth daily. For high blood pressure management 02/06/13  Yes Sanjuana Kava, NP  Multiple Vitamin (MULTIVITAMIN WITH MINERALS) TABS tablet Take 1 tablet by mouth daily. (Resume with the vitamins that you have  at home): For low vitamin 02/06/13  Yes Sanjuana Kava, NP  pantoprazole (PROTONIX) 40 MG tablet Take 40 mg by mouth 2 (two) times daily. 02/28/13  Yes Historical Provider, MD  pyridOXINE (VITAMIN B-6) 100 MG tablet Take 1 tablet (100 mg total) by mouth daily. For low B-6 Vitamin supplement 02/06/13  Yes Sanjuana Kava, NP  Skin Protectants, Misc. (EUCERIN) cream Apply 1 application topically as needed for dry skin (apply to legs). For dryness 02/06/13  Yes Sanjuana Kava, NP  spironolactone (ALDACTONE) 50 MG tablet Take 50 mg by mouth daily. 02/28/13  Yes Historical Provider, MD   Physical Exam: Filed Vitals:   03/11/13 1800  BP: 158/78  Pulse: 117  Temp:   Resp: 19     General:   Cooperative, gaurded  Eyes: wnl  ENT: wnl  Neck: wnl  Cardiovascular: tachy  Respiratory: clear anterior  Abdomen: +BS, proturberant  Skin: chronic changes on LE  Musculoskeletal: moves all 4 ext  Psychiatric: no suicidal ideations  Neurologic: mild tremors  Labs on Admission:  Basic Metabolic Panel:  Recent Labs Lab 03/11/13 1703  NA 134*  K 3.7  CL 98  CO2 22  GLUCOSE 247*  BUN 12  CREATININE 0.71  CALCIUM 7.8*   Liver Function Tests:  Recent Labs Lab 03/11/13 1703  AST 89*  ALT 30  ALKPHOS 163*  BILITOT 3.9*  PROT 6.6  ALBUMIN 2.3*   No results found for this basename: LIPASE, AMYLASE,  in the last 168 hours  Recent Labs Lab 03/11/13 1706  AMMONIA 71*   CBC:  Recent Labs Lab 03/11/13 1703  WBC 4.3  NEUTROABS 3.1  HGB 11.3*  HCT 33.2*  MCV 94.1  PLT 48*   Cardiac Enzymes: No results found for this basename: CKTOTAL, CKMB, CKMBINDEX, TROPONINI,  in the last 168 hours  BNP (last 3 results) No results found for this basename: PROBNP,  in the last 8760 hours CBG: No results found for this basename: GLUCAP,  in the last 168 hours  Radiological Exams on Admission: Dg Chest Portable 1 View  03/11/2013   CLINICAL DATA:  Rectal bleeding  EXAM: PORTABLE CHEST - 1 VIEW  COMPARISON:  12/2012  FINDINGS: 1733 hr. Lung volumes are low on this lordotic film. No edema or focal airspace consolidation. Cardiopericardial silhouette is at upper limits of normal for size. Imaged bony structures of the thorax are intact. Telemetry leads overlie the chest.  IMPRESSION: No acute cardiopulmonary findings.   Electronically Signed   By: Kennith Center M.D.   On: 03/11/2013 17:47      Assessment/Plan Active Problems:   Alcohol abuse   Acute GI bleeding   Diabetes mellitus   Depression   Ulcer, stomach peptic   Cirrhosis   Admit to SDU for IV protonix gtt, GI consult, NPO after midnight, ice chips ok for now.  CBC q 8 hours, SSI, monitor for alcohol  withdrawal-- on CIWA with ATC and PRN coverage- may need precidex and ICU consult if withdraws severely.   -has decreased plts and increased INR- may required supplementation if further bleeding  Elevated ammonia- once stable place on lactulose   Asked GI to consult  Code Status: full Family Communication: patient Disposition Plan: admit to SDU  Time spent: 75 min  Gabriell Casimir [facility] Pager [phone]  If 7PM-7AM, please contact night-coverage [email or web address] Password [facility] 03/11/2013, 6:38 PM

## 2013-03-11 NOTE — Consult Note (Signed)
Subjective:   HPI  The patient is a 57 year old male with a history of alcoholic cirrhosis of the liver. We are asked to see him in regards to coffee ground emesis. The patient states that he usually drinks a lot of alcohol every day but today he couldn't drink so much because he was vomiting. When he was vomiting looked dark and like coffee grounds according to him. He denies vomiting any bright red blood. He did not pass any blood per stool and his stool was brown in color. He was checked in the ER however in the stools were heme positive. In November 2013 he presented with GI bleeding and had upper endoscopy by Dr. Yancey Flemings which showed a duodenal ulcer, several clean-based antral ulcers, and some small distal esophageal varices without stigmata of bleeding.  Review of Systems Denies chest pain or shortness of breath  Past Medical History  Diagnosis Date  . Neuropathy   . Diabetes mellitus   . Bipolar affect, depressed   . Hypertension   . Arthritis   . Stroke     Mini stroke about 18yrs ago  . Cirrhosis   . Alcohol abuse   . Chronic pain   . Cocaine abuse   . Muscle spasm     both legs   Past Surgical History  Procedure Laterality Date  . Fracture surgery      Leg and arm 68yrs ago  . Esophagogastroduodenoscopy  04/04/2012    Procedure: ESOPHAGOGASTRODUODENOSCOPY (EGD);  Surgeon: Hilarie Fredrickson, MD;  Location: [facility] ENDOSCOPY;  Service: Endoscopy;  Laterality: N/A;  . Eye surgery  8 months ago both eyes   History   Social History  . Marital Status: Divorced    Spouse Name: N/A    Number of Children: N/A  . Years of Education: N/A   Occupational History  . Not on file.   Social History Main Topics  . Smoking status: Current Every Day Smoker -- 1.00 packs/day for 30 years    Types: Cigarettes    Last Attempt to Quit: 04/06/2012  . Smokeless tobacco: Never Used     Comment: quit   . Alcohol Use: 0.0 oz/week     Comment: 12 pk beer daily  . Drug Use: Yes    Special:  Cocaine     Comment: 1 gram weekly  . Sexual Activity: Yes    Birth Control/ Protection: None   Other Topics Concern  . Not on file   Social History Narrative  . No narrative on file   family history includes Hypotension in his mother. Current facility-administered medications:0.9 %  sodium chloride infusion, 1,000 mL, Intravenous, Continuous, Hilario Quarry, MD, Last Rate: 125 mL/hr at 03/11/13 1917, 1,000 mL at 03/11/13 1917 Current outpatient prescriptions:ALPRAZolam (XANAX) 1 MG tablet, Take 1 mg by mouth 3 (three) times daily as needed for anxiety., Disp: , Rfl: ;  amitriptyline (ELAVIL) 50 MG tablet, Take 50 mg by mouth at bedtime., Disp: , Rfl: ;  cholecalciferol (VITAMIN D) 1000 UNITS tablet, Take 1 tablet (1,000 Units total) by mouth at bedtime. For bone health, Disp: , Rfl:  diclofenac sodium (VOLTAREN) 1 % GEL, Apply 2 g topically daily. Apply to back and legs: For osteoarthritic pain., Disp: , Rfl: ;  gabapentin (NEURONTIN) 100 MG capsule, Take 100 mg by mouth 3 (three) times daily., Disp: , Rfl: ;  HYDROcodone-acetaminophen (NORCO/VICODIN) 5-325 MG per tablet, Take 2 tablets by mouth every 6 (six) hours as needed for pain., Disp: ,  Rfl:  insulin glargine (LANTUS) 100 UNIT/ML injection, Inject 0.45 mLs (45 Units total) into the skin at bedtime. For diabetes management, Disp: 10 mL, Rfl: 12;  lisinopril (PRINIVIL,ZESTRIL) 20 MG tablet, Take 1 tablet (20 mg total) by mouth daily. For high blood pressure management, Disp: , Rfl:  Multiple Vitamin (MULTIVITAMIN WITH MINERALS) TABS tablet, Take 1 tablet by mouth daily. (Resume with the vitamins that you have at home): For low vitamin, Disp: 30 tablet, Rfl: 0;  pantoprazole (PROTONIX) 40 MG tablet, Take 40 mg by mouth 2 (two) times daily., Disp: , Rfl: ;  pyridOXINE (VITAMIN B-6) 100 MG tablet, Take 1 tablet (100 mg total) by mouth daily. For low B-6 Vitamin supplement, Disp: , Rfl:  Skin Protectants, Misc. (EUCERIN) cream, Apply 1 application  topically as needed for dry skin (apply to legs). For dryness, Disp: 454 g, Rfl: 0;  spironolactone (ALDACTONE) 50 MG tablet, Take 50 mg by mouth daily., Disp: , Rfl:  No Known Allergies   Objective:     BP 121/58  Pulse 116  Temp(Src) 98.8 F (37.1 C) (Oral)  Resp 24  SpO2 95%  He is alert and oriented  He does not appear in any acute distress  Nonicteric  Heart regular rhythm no murmurs  Lungs clear  Abdomen: Bowel sounds normal, soft, nontender  Laboratory No components found with this basename: d1      Assessment:     #1. Cirrhosis of the liver  #2. Alcoholism  #3. GI bleed characterized by coffee-ground emesis  #4. Prior endoscopy in November 2013 showed a duodenal ulcer, antral ulcers, and small esophageal varices      Plan:     He is being admitted to the hospital by the hospitalist service. We will keep him n.p.o. We will plan EGD tomorrow. I would recommend PPI therapy tonight as well as empiric octreotide drip. Follow H&H. Transfuse as needed . Lab Results  Component Value Date   HGB 11.3* 03/11/2013   HGB 12.1* 02/04/2013   HGB 10.8* 01/17/2013   HGB 10.2* 04/16/2012   HCT 33.2* 03/11/2013   HCT 34.8* 02/04/2013   HCT 32.1* 01/17/2013   HCT 33.4* 04/16/2012   ALKPHOS 163* 03/11/2013   ALKPHOS 186* 02/04/2013   ALKPHOS 130* 01/17/2013   AST 89* 03/11/2013   AST 94* 02/04/2013   AST 76* 01/17/2013   ALT 30 03/11/2013   ALT 31 02/04/2013   ALT 25 01/17/2013

## 2013-03-11 NOTE — ED Provider Notes (Addendum)
CSN: [id number]     Arrival date & time 03/11/13  1643 History   First MD Initiated Contact with Patient 03/11/13 1644     Chief Complaint  Patient presents with  . Rectal Bleeding  . Hematemesis   (Consider location/radiation/quality/duration/timing/severity/associated sxs/prior Treatment) HPI Patient states he has vomited multiple times today beginning in late am.  He describes the emesis as dark brown with dark brown to red stools up to five times.  He denies pain preceding these episodes or current pain.  He states he drank his last beer at 11 am and would normally have drank more alcohol by now but was unable to due to vomiting.  He denies history of gi bleeding but states he has had some problems with his liver.  He denies chest pain, sob, abdominal pain.  He states he was weak and light headed but denies focal neurologic symptoms or syncope or chest pain.  He denies previous hisotry of gi bleeding or alcoholic gastritis.   Past Medical History  Diagnosis Date  . Neuropathy   . Diabetes mellitus   . Bipolar affect, depressed   . Hypertension   . Arthritis   . Stroke     Mini stroke about 4yrs ago  . Cirrhosis   . Alcohol abuse   . Chronic pain   . Cocaine abuse   . Muscle spasm     both legs   Past Surgical History  Procedure Laterality Date  . Fracture surgery      Leg and arm 39yrs ago  . Esophagogastroduodenoscopy  04/04/2012    Procedure: ESOPHAGOGASTRODUODENOSCOPY (EGD);  Surgeon: Hilarie Fredrickson, MD;  Location: [facility] ENDOSCOPY;  Service: Endoscopy;  Laterality: N/A;  . Eye surgery  8 months ago both eyes   Family History  Problem Relation Age of Onset  . Hypotension Mother    History  Substance Use Topics  . Smoking status: Current Every Day Smoker -- 1.00 packs/day for 30 years    Types: Cigarettes    Last Attempt to Quit: 04/06/2012  . Smokeless tobacco: Never Used     Comment: quit   . Alcohol Use: 0.0 oz/week     Comment: 12 pk beer daily    Review of  Systems  All other systems reviewed and are negative.    Allergies  Review of patient's allergies indicates no known allergies.  Home Medications   Current Outpatient Rx  Name  Route  Sig  Dispense  Refill  . ALPRAZolam (XANAX) 1 MG tablet   Oral   Take 1 mg by mouth 3 (three) times daily as needed for anxiety.         Marland Kitchen amitriptyline (ELAVIL) 50 MG tablet   Oral   Take 50 mg by mouth at bedtime.         . cholecalciferol (VITAMIN D) 1000 UNITS tablet   Oral   Take 1 tablet (1,000 Units total) by mouth at bedtime. For bone health         . diclofenac sodium (VOLTAREN) 1 % GEL   Topical   Apply 2 g topically daily. Apply to back and legs: For osteoarthritic pain.         Marland Kitchen gabapentin (NEURONTIN) 100 MG capsule   Oral   Take 100 mg by mouth 3 (three) times daily.         Marland Kitchen HYDROcodone-acetaminophen (NORCO/VICODIN) 5-325 MG per tablet   Oral   Take 2 tablets by mouth every 6 (six) hours as  needed for pain.         Marland Kitchen insulin glargine (LANTUS) 100 UNIT/ML injection   Subcutaneous   Inject 0.45 mLs (45 Units total) into the skin at bedtime. For diabetes management   10 mL   12   . lisinopril (PRINIVIL,ZESTRIL) 20 MG tablet   Oral   Take 1 tablet (20 mg total) by mouth daily. For high blood pressure management         . Multiple Vitamin (MULTIVITAMIN WITH MINERALS) TABS tablet   Oral   Take 1 tablet by mouth daily. (Resume with the vitamins that you have at home): For low vitamin   30 tablet   0   . pantoprazole (PROTONIX) 40 MG tablet   Oral   Take 40 mg by mouth 2 (two) times daily.         Marland Kitchen pyridOXINE (VITAMIN B-6) 100 MG tablet   Oral   Take 1 tablet (100 mg total) by mouth daily. For low B-6 Vitamin supplement         . Skin Protectants, Misc. (EUCERIN) cream   Topical   Apply 1 application topically as needed for dry skin (apply to legs). For dryness   454 g   0   . spironolactone (ALDACTONE) 50 MG tablet   Oral   Take 50 mg by  mouth daily.          BP 154/75  Pulse 124  Temp(Src) 98.8 F (37.1 C) (Oral)  SpO2 97% Physical Exam  Nursing note and vitals reviewed. Constitutional: He is oriented to person, place, and time. He appears well-developed and well-nourished.  Uncomfortable appearing  HENT:  Head: Normocephalic and atraumatic.  Right Ear: External ear normal.  Left Ear: External ear normal.  Nose: Nose normal.  Mouth/Throat: Oropharynx is clear and moist.  Eyes: Conjunctivae and EOM are normal. Pupils are equal, round, and reactive to light.  Neck: Normal range of motion. Neck supple.  Cardiovascular: Normal heart sounds and normal pulses.  Tachycardia present.   Pulmonary/Chest: Effort normal and breath sounds normal.  Abdominal: Soft. Bowel sounds are normal. He exhibits distension. There is no tenderness. There is no rebound and no guarding.  Genitourinary: Prostate normal. Guaiac positive stool.  Musculoskeletal: Normal range of motion.  Neurological: He is alert and oriented to person, place, and time. He has normal reflexes.  Skin: Skin is warm and dry.  Psychiatric: He has a normal mood and affect. His behavior is normal. Judgment and thought content normal.   Results for orders placed during the hospital encounter of 03/11/13  CBC WITH DIFFERENTIAL      Result Value Range   WBC 4.3  4.0 - 10.5 K/uL   RBC 3.53 (*) 4.22 - 5.81 MIL/uL   Hemoglobin 11.3 (*) 13.0 - 17.0 g/dL   HCT 16.1 (*) 09.6 - 04.5 %   MCV 94.1  78.0 - 100.0 fL   MCH 32.0  26.0 - 34.0 pg   MCHC 34.0  30.0 - 36.0 g/dL   RDW 40.9 (*) 81.1 - 91.4 %   Platelets 48 (*) 150 - 400 K/uL   Neutrophils Relative % 73  43 - 77 %   Neutro Abs 3.1  1.7 - 7.7 K/uL   Lymphocytes Relative 14  12 - 46 %   Lymphs Abs 0.6 (*) 0.7 - 4.0 K/uL   Monocytes Relative 11  3 - 12 %   Monocytes Absolute 0.5  0.1 - 1.0 K/uL   Eosinophils Relative  1  0 - 5 %   Eosinophils Absolute 0.0  0.0 - 0.7 K/uL   Basophils Relative 1  0 - 1 %    Basophils Absolute 0.0  0.0 - 0.1 K/uL  COMPREHENSIVE METABOLIC PANEL      Result Value Range   Sodium 134 (*) 135 - 145 mEq/L   Potassium 3.7  3.5 - 5.1 mEq/L   Chloride 98  96 - 112 mEq/L   CO2 22  19 - 32 mEq/L   Glucose, Bld 247 (*) 70 - 99 mg/dL   BUN 12  6 - 23 mg/dL   Creatinine, Ser 4.09  0.50 - 1.35 mg/dL   Calcium 7.8 (*) 8.4 - 10.5 mg/dL   Total Protein 6.6  6.0 - 8.3 g/dL   Albumin 2.3 (*) 3.5 - 5.2 g/dL   AST 89 (*) 0 - 37 U/L   ALT 30  0 - 53 U/L   Alkaline Phosphatase 163 (*) 39 - 117 U/L   Total Bilirubin 3.9 (*) 0.3 - 1.2 mg/dL   GFR calc non Af Amer >90  >90 mL/min   GFR calc Af Amer >90  >90 mL/min  AMMONIA      Result Value Range   Ammonia 71 (*) 11 - 60 umol/L  PROTIME-INR      Result Value Range   Prothrombin Time 18.9 (*) 11.6 - 15.2 seconds   INR 1.63 (*) 0.00 - 1.49  ETHANOL      Result Value Range   Alcohol, Ethyl (B) 57 (*) 0 - 11 mg/dL  OCCULT BLOOD, POC DEVICE      Result Value Range   Fecal Occult Bld POSITIVE (*) NEGATIVE  TYPE AND SCREEN      Result Value Range   ABO/RH(D) O POS     Antibody Screen NEG     Sample Expiration 03/14/2013      ED Course  Procedures (including critical care time) Labs Review Labs Reviewed  OCCULT BLOOD, POC DEVICE - Abnormal; Notable for the following:    Fecal Occult Bld POSITIVE (*)    All other components within normal limits  OCCULT BLOOD X 1 CARD TO LAB, STOOL  CBC WITH DIFFERENTIAL  COMPREHENSIVE METABOLIC PANEL  AMMONIA  PROTIME-INR  URINE RAPID DRUG SCREEN (HOSP PERFORMED)  ETHANOL  TYPE AND SCREEN   Imaging Review No results found.  EKG Interpretation   None       MDM  No diagnosis found. Patient given one liter ns, ativan 1 mg iv, protonix 80 mg iv and hr down to 120.  Plan additional iv fluids and ativan.  Suspect alcoholic gastritis vs bleeding secondary to ulcer vs variceal bleeding vs alcohol withdrawal.  Patient with some tachycardia but hemoglobin 11.  One gram decrease in  hemoglobin. Patient thrombocytopenic wit platelets of 43,000 and elevated inr.  Patient care discussed with Dr. Benjamine Mola and she requests that I consult gi.  She will be in to assess and admit patient.   Plan additional ativan, continued monitoring for gi bleed, and hemodynamic status.  Patient will require aggressive alcohol detox protocol while hospitalized for gi bleeding.   Patient care discussed with Dr. Evette Cristal. And he will see inconsult.   Hilario Quarry, MD 03/11/13 1910  Patient with elevated ammonia noted.    Hilario Quarry, MD 03/11/13 [phone]

## 2013-03-12 DIAGNOSIS — K7460 Unspecified cirrhosis of liver: Secondary | ICD-10-CM

## 2013-03-12 LAB — GLUCOSE, CAPILLARY
Glucose-Capillary: 125 mg/dL — ABNORMAL HIGH (ref 70–99)
Glucose-Capillary: 143 mg/dL — ABNORMAL HIGH (ref 70–99)
Glucose-Capillary: 146 mg/dL — ABNORMAL HIGH (ref 70–99)
Glucose-Capillary: 148 mg/dL — ABNORMAL HIGH (ref 70–99)
Glucose-Capillary: 178 mg/dL — ABNORMAL HIGH (ref 70–99)
Glucose-Capillary: 210 mg/dL — ABNORMAL HIGH (ref 70–99)
Glucose-Capillary: 88 mg/dL (ref 70–99)

## 2013-03-12 LAB — CBC
HCT: 29.3 % — ABNORMAL LOW (ref 39.0–52.0)
HCT: 28.9 % — ABNORMAL LOW (ref 39.0–52.0)
Hemoglobin: 10 g/dL — ABNORMAL LOW (ref 13.0–17.0)
Hemoglobin: 10.5 g/dL — ABNORMAL LOW (ref 13.0–17.0)
Hemoglobin: 9.9 g/dL — ABNORMAL LOW (ref 13.0–17.0)
MCH: 32.2 pg (ref 26.0–34.0)
MCH: 32.3 pg (ref 26.0–34.0)
MCH: 32.7 pg (ref 26.0–34.0)
MCHC: 33.4 g/dL (ref 30.0–36.0)
MCHC: 34.1 g/dL (ref 30.0–36.0)
MCHC: 34.3 g/dL (ref 30.0–36.0)
MCV: 94.5 fL (ref 78.0–100.0)
MCV: 94.5 fL (ref 78.0–100.0)
MCV: 95.4 fL (ref 78.0–100.0)
MCV: 95.4 fL (ref 78.0–100.0)
Platelets: 35 10*3/uL — ABNORMAL LOW (ref 150–400)
Platelets: 38 10*3/uL — ABNORMAL LOW (ref 150–400)
Platelets: 41 10*3/uL — ABNORMAL LOW (ref 150–400)
RBC: 3.03 MIL/uL — ABNORMAL LOW (ref 4.22–5.81)
RBC: 3.07 MIL/uL — ABNORMAL LOW (ref 4.22–5.81)
RBC: 3.26 MIL/uL — ABNORMAL LOW (ref 4.22–5.81)
RDW: 19.9 % — ABNORMAL HIGH (ref 11.5–15.5)
RDW: 20 % — ABNORMAL HIGH (ref 11.5–15.5)
RDW: 20.1 % — ABNORMAL HIGH (ref 11.5–15.5)
RDW: 20.1 % — ABNORMAL HIGH (ref 11.5–15.5)
WBC: 3.2 10*3/uL — ABNORMAL LOW (ref 4.0–10.5)
WBC: 3.5 10*3/uL — ABNORMAL LOW (ref 4.0–10.5)
WBC: 4.2 10*3/uL (ref 4.0–10.5)
WBC: 4.4 10*3/uL (ref 4.0–10.5)

## 2013-03-12 LAB — COMPREHENSIVE METABOLIC PANEL
ALT: 26 U/L (ref 0–53)
AST: 86 U/L — ABNORMAL HIGH (ref 0–37)
Albumin: 2 g/dL — ABNORMAL LOW (ref 3.5–5.2)
Alkaline Phosphatase: 139 U/L — ABNORMAL HIGH (ref 39–117)
Chloride: 105 mEq/L (ref 96–112)
Potassium: 3.7 mEq/L (ref 3.5–5.1)
Sodium: 136 mEq/L (ref 135–145)
Total Protein: 5.8 g/dL — ABNORMAL LOW (ref 6.0–8.3)

## 2013-03-12 LAB — MRSA PCR SCREENING: MRSA by PCR: NEGATIVE

## 2013-03-12 LAB — HEMOGLOBIN A1C
Hgb A1c MFr Bld: 7.7 % — ABNORMAL HIGH (ref <5.7)
Mean Plasma Glucose: 174 mg/dL — ABNORMAL HIGH (ref <117)

## 2013-03-12 MED ORDER — INFLUENZA VAC SPLIT QUAD 0.5 ML IM SUSP
0.5000 mL | INTRAMUSCULAR | Status: AC
  Administered 2013-03-13: 0.5 mL via INTRAMUSCULAR
  Filled 2013-03-12: qty 0.5

## 2013-03-12 MED ORDER — INSULIN GLARGINE 100 UNIT/ML ~~LOC~~ SOLN
10.0000 [IU] | Freq: Every day | SUBCUTANEOUS | Status: DC
  Administered 2013-03-12: 10 [IU] via SUBCUTANEOUS
  Filled 2013-03-12 (×2): qty 0.1

## 2013-03-12 MED ORDER — SODIUM CHLORIDE 0.9 % IV SOLN
1.0000 mg | Freq: Every day | INTRAVENOUS | Status: AC
  Administered 2013-03-12 – 2013-03-13 (×2): 1 mg via INTRAVENOUS
  Filled 2013-03-12 (×2): qty 0.2

## 2013-03-12 MED ORDER — THIAMINE HCL 100 MG/ML IJ SOLN
100.0000 mg | Freq: Every day | INTRAMUSCULAR | Status: DC
  Administered 2013-03-12 – 2013-03-13 (×2): 100 mg via INTRAVENOUS
  Filled 2013-03-12 (×3): qty 1

## 2013-03-12 MED ORDER — WHITE PETROLATUM GEL
Status: AC
  Administered 2013-03-12: 0.2
  Filled 2013-03-12: qty 5

## 2013-03-12 NOTE — Progress Notes (Signed)
Inpatient Diabetes Program Recommendations  AACE/ADA: New Consensus Statement on Inpatient Glycemic Control (2013)  Target Ranges:  Prepandial:   less than 140 mg/dL      Peak postprandial:   less than 180 mg/dL (1-2 hours)      Critically ill patients:  140 - 180 mg/dL  Results for [facility], [facility] (MRN [id number]) as of 03/12/2013 09:37  Ref. Range 03/12/2013 00:19 03/12/2013 03:50 03/12/2013 08:08  Glucose-Capillary Latest Range: 70-99 mg/dL 696 (H) 295 (H) 284 (H)   Inpatient Diabetes Program Recommendations Insulin - Basal: consider starting low dose basal Lantus 10 units (patient takes 45 units at home) Thank you  Piedad Climes BSN, RN,CDE Inpatient Diabetes Coordinator [phone] (team pager)

## 2013-03-12 NOTE — Progress Notes (Signed)
Subjective: No hematemesis or blood in stool over night. Some respiratory troubles, requiring BiPap overnight.  Objective: Vital signs in last 24 hours: Temp:  [98.1 F (36.7 C)-98.9 F (37.2 C)] 98.9 F (37.2 C) (10/21 0809) Pulse Rate:  [101-126] 101 (10/21 0351) Resp:  [17-28] 18 (10/21 0351) BP: (117-170)/(58-99) 152/82 mmHg (10/21 0500) SpO2:  [93 %-98 %] 97 % (10/21 0351) Weight:  [109.9 kg (242 lb 4.6 oz)] 109.9 kg (242 lb 4.6 oz) (10/20 2100) Weight change:  Last BM Date: 03/11/13  PE: GEN:  Some tachypneia, on Bipap Mask; chronically ill-appearing, older-appearing than stated age ABD:  Soft, non-tender.  Lab Results: CBC    Component Value Date/Time   WBC 4.2 03/12/2013 0430   WBC 3.7* 04/16/2012 2022   RBC 3.07* 03/12/2013 0430   RBC 3.77* 04/16/2012 2022   HGB 9.8* 03/12/2013 0430   HGB 10.2* 04/16/2012 2022   HCT 29.3* 03/12/2013 0430   HCT 33.4* 04/16/2012 2022   PLT 35* 03/12/2013 0430   MCV 95.4 03/12/2013 0430   MCV 88.5 04/16/2012 2022   MCH 31.9 03/12/2013 0430   MCH 27.1 04/16/2012 2022   MCHC 33.4 03/12/2013 0430   MCHC 30.5* 04/16/2012 2022   RDW 20.0* 03/12/2013 0430   LYMPHSABS 0.6* 03/11/2013 1703   MONOABS 0.5 03/11/2013 1703   EOSABS 0.0 03/11/2013 1703   BASOSABS 0.0 03/11/2013 1703   Assessment:  1.  Coffee ground emesis. No further bleeding. 2.  History cirrhosis.  Plan:  1.  Continue medical management. 2.  Patient's respiratory status too tenuous for endoscopy today.  If he has rampant bleeding today, he would require intubation before I would consider doing an endoscopy. 3.  Will revisit patient tomorrow.   Freddy Jaksch 03/12/2013, 8:33 AM

## 2013-03-12 NOTE — Progress Notes (Signed)
[facility] PROGRESS NOTE  [name] ZOX:[id number] DOB: [birth year] DOA: 03/11/2013 PCP: Lonia Blood, MD  Assessment/Plan: 1. GI bleed- suspect upper as patient is an alcoholic and had ulcers in the past- for EGD when stable, protonix, octreotide gtt; Hgb trending down 2. Suspect OSA- will need outpatient sleep study 3. Cirrhosis- ? varacies- await EGD 4. Elevated ammonia may need lactulose once able to take POs 5. Alcohol abuse causing thrombocytopenia- denies DTs in past, CIWA 6. DM- SSI 7. Recent ER visit for suicidal ideations  Code Status: full Family Communication: patient Disposition Plan: SDU   Consultants:  GI  Procedures:  EDG?  Antibiotics:    HPI/Subjective: No further bleeding No complaints  Objective: Filed Vitals:   03/12/13 0809  BP:   Pulse:   Temp: 98.9 F (37.2 C)  Resp:     Intake/Output Summary (Last 24 hours) at 03/12/13 0920 Last data filed at 03/12/13 0900  Gross per 24 hour  Intake 1265.83 ml  Output    480 ml  Net 785.83 ml   Filed Weights   03/11/13 2100  Weight: 109.9 kg (242 lb 4.6 oz)    Exam:   General: pleasant- no confusion  Cardiovascular: rrr  Respiratory: clear. No wheezing  Abdomen: protuberant, +Bs, no tenderness  Musculoskeletal: moves all 4 ext   Data Reviewed: Basic Metabolic Panel:  Recent Labs Lab 03/11/13 1703 03/12/13 0430  NA 134* 136  K 3.7 3.7  CL 98 105  CO2 22 24  GLUCOSE 247* 180*  BUN 12 15  CREATININE 0.71 0.69  CALCIUM 7.8* 7.2*   Liver Function Tests:  Recent Labs Lab 03/11/13 1703 03/12/13 0430  AST 89* 86*  ALT 30 26  ALKPHOS 163* 139*  BILITOT 3.9* 4.7*  PROT 6.6 5.8*  ALBUMIN 2.3* 2.0*   No results found for this basename: LIPASE, AMYLASE,  in the last 168 hours  Recent Labs Lab 03/11/13 1706  AMMONIA 71*   CBC:  Recent Labs Lab 03/11/13 1703 03/11/13 2345 03/12/13 0430  WBC 4.3 4.4 4.2  NEUTROABS 3.1  --   --   HGB 11.3* 10.5* 9.8*   HCT 33.2* 30.8* 29.3*  MCV 94.1 94.5 95.4  PLT 48* 41* 35*   Cardiac Enzymes: No results found for this basename: CKTOTAL, CKMB, CKMBINDEX, TROPONINI,  in the last 168 hours BNP (last 3 results) No results found for this basename: PROBNP,  in the last 8760 hours CBG:  Recent Labs Lab 03/12/13 0019 03/12/13 0350 03/12/13 0808  GLUCAP 210* 178* 143*    Recent Results (from the past 240 hour(s))  MRSA PCR SCREENING     Status: None   Collection Time    03/11/13  9:19 PM      Result Value Range Status   MRSA by PCR NEGATIVE  NEGATIVE Final   Comment:            The GeneXpert MRSA Assay (FDA     approved for NASAL specimens     only), is one component of a     comprehensive MRSA colonization     surveillance program. It is not     intended to diagnose MRSA     infection nor to guide or     monitor treatment for     MRSA infections.     Studies: Dg Chest Portable 1 View  03/11/2013   CLINICAL DATA:  Rectal bleeding  EXAM: PORTABLE CHEST - 1 VIEW  COMPARISON:  12/2012  FINDINGS: [phone]  hr. Lung volumes are low on this lordotic film. No edema or focal airspace consolidation. Cardiopericardial silhouette is at upper limits of normal for size. Imaged bony structures of the thorax are intact. Telemetry leads overlie the chest.  IMPRESSION: No acute cardiopulmonary findings.   Electronically Signed   By: Kennith Center M.D.   On: 03/11/2013 17:47    Scheduled Meds: . [START ON 03/13/2013] influenza vac split quadrivalent PF  0.5 mL Intramuscular Tomorrow-1000  . insulin aspart  0-15 Units Subcutaneous Q4H  . LORazepam  0-4 mg Intravenous Q6H   Followed by  . [START ON 03/13/2013] LORazepam  0-4 mg Intravenous Q12H  . [START ON 03/15/2013] pantoprazole (PROTONIX) IV  40 mg Intravenous Q12H  . sodium chloride  3 mL Intravenous Q12H   Continuous Infusions: . sodium chloride 20 mL/hr at 03/12/13 0656  . octreotide (SANDOSTATIN) infusion 50 mcg/hr (03/12/13 0815)  . pantoprozole  (PROTONIX) infusion 8 mg/hr (03/12/13 0815)    Active Problems:   Alcohol abuse   Acute GI bleeding   Diabetes mellitus   Thrombocytopenia   Depression   Ulcer, stomach peptic   Cirrhosis   Serum ammonia increased   Tobacco abuse    Time spent: 35 min    [name]  [facility] Pager [phone]. If 7PM-7AM, please contact night-coverage at [email or web address], password [facility] 03/12/2013, 9:20 AM  LOS: 1 day

## 2013-03-13 ENCOUNTER — Encounter (HOSPITAL_COMMUNITY)
Admission: EM | Disposition: A | Discharge status: Home / Self Care | Payer: Self-pay | Source: Home / Self Care | Attending: Internal Medicine

## 2013-03-13 HISTORY — PX: ESOPHAGOGASTRODUODENOSCOPY: SHX5428

## 2013-03-13 LAB — GLUCOSE, CAPILLARY
Glucose-Capillary: 91 mg/dL (ref 70–99)
Glucose-Capillary: 88 mg/dL (ref 70–99)
Glucose-Capillary: 86 mg/dL (ref 70–99)
Glucose-Capillary: 149 mg/dL — ABNORMAL HIGH (ref 70–99)
Glucose-Capillary: 245 mg/dL — ABNORMAL HIGH (ref 70–99)

## 2013-03-13 LAB — CBC
Hemoglobin: 9.4 g/dL — ABNORMAL LOW (ref 13.0–17.0)
Hemoglobin: 9.7 g/dL — ABNORMAL LOW (ref 13.0–17.0)
MCH: 32.3 pg (ref 26.0–34.0)
MCHC: 33.9 g/dL (ref 30.0–36.0)
Platelets: 39 10*3/uL — ABNORMAL LOW (ref 150–400)
RBC: 2.9 MIL/uL — ABNORMAL LOW (ref 4.22–5.81)
RBC: 3 MIL/uL — ABNORMAL LOW (ref 4.22–5.81)
RDW: 20.2 % — ABNORMAL HIGH (ref 11.5–15.5)
WBC: 3.6 10*3/uL — ABNORMAL LOW (ref 4.0–10.5)

## 2013-03-13 SURGERY — EGD (ESOPHAGOGASTRODUODENOSCOPY)
Anesthesia: Moderate Sedation | Laterality: Left

## 2013-03-13 MED ORDER — DIPHENHYDRAMINE HCL 50 MG/ML IJ SOLN
INTRAMUSCULAR | Status: AC
  Filled 2013-03-13: qty 1

## 2013-03-13 MED ORDER — SODIUM CHLORIDE 0.9 % IV SOLN: INTRAVENOUS | Status: DC

## 2013-03-13 MED ORDER — PANTOPRAZOLE SODIUM 40 MG PO TBEC
40.0000 mg | DELAYED_RELEASE_TABLET | Freq: Every day | ORAL | Status: DC
  Administered 2013-03-13 – 2013-03-14 (×2): 40 mg via ORAL
  Filled 2013-03-13 (×2): qty 1

## 2013-03-13 MED ORDER — OXYCODONE HCL 5 MG PO TABS
5.0000 mg | ORAL_TABLET | Freq: Four times a day (QID) | ORAL | Status: DC | PRN
  Administered 2013-03-13 – 2013-03-14 (×3): 5 mg via ORAL
  Filled 2013-03-13 (×3): qty 1

## 2013-03-13 MED ORDER — INSULIN ASPART 100 UNIT/ML ~~LOC~~ SOLN
0.0000 [IU] | Freq: Three times a day (TID) | SUBCUTANEOUS | Status: DC
  Administered 2013-03-14: 3 [IU] via SUBCUTANEOUS

## 2013-03-13 MED ORDER — PROPRANOLOL HCL 10 MG PO TABS
10.0000 mg | ORAL_TABLET | Freq: Two times a day (BID) | ORAL | Status: DC
  Administered 2013-03-13 – 2013-03-14 (×3): 10 mg via ORAL
  Filled 2013-03-13 (×5): qty 1

## 2013-03-13 MED ORDER — BUTAMBEN-TETRACAINE-BENZOCAINE 2-2-14 % EX AERO
INHALATION_SPRAY | CUTANEOUS | Status: DC | PRN
  Administered 2013-03-13: 2 via TOPICAL

## 2013-03-13 MED ORDER — FENTANYL CITRATE 0.05 MG/ML IJ SOLN
INTRAMUSCULAR | Status: AC
  Filled 2013-03-13: qty 2

## 2013-03-13 MED ORDER — FOLIC ACID 5 MG/ML IJ SOLN
1.0000 mg | Freq: Every day | INTRAMUSCULAR | Status: DC
  Filled 2013-03-13: qty 0.2

## 2013-03-13 MED ORDER — TRAMADOL HCL 50 MG PO TABS
50.0000 mg | ORAL_TABLET | Freq: Four times a day (QID) | ORAL | Status: AC | PRN
  Administered 2013-03-13 (×2): 50 mg via ORAL
  Filled 2013-03-13 (×2): qty 1

## 2013-03-13 MED ORDER — MIDAZOLAM HCL 10 MG/2ML IJ SOLN
INTRAMUSCULAR | Status: DC | PRN
  Administered 2013-03-13 (×2): 2 mg via INTRAVENOUS

## 2013-03-13 MED ORDER — FENTANYL CITRATE 0.05 MG/ML IJ SOLN
INTRAMUSCULAR | Status: DC | PRN
  Administered 2013-03-13 (×2): 25 ug via INTRAVENOUS

## 2013-03-13 MED ORDER — MIDAZOLAM HCL 5 MG/ML IJ SOLN
INTRAMUSCULAR | Status: AC
  Filled 2013-03-13: qty 2

## 2013-03-13 NOTE — H&P (View-Only) (Signed)
No further coffee ground emesis.  Respiratory status improved.  Will plan EGD later this afternoon.  Clear liquids ok until noon, then NPO.  Case reviewed with patient and he is in agreement.

## 2013-03-13 NOTE — Interval H&P Note (Signed)
History and Physical Interval Note:  03/13/2013 11:47 AM  [name]  has presented today for surgery, with the diagnosis of coffee ground emesis  The various methods of treatment have been discussed with the patient and family. After consideration of risks, benefits and other options for treatment, the patient has consented to  Procedure(s): ESOPHAGOGASTRODUODENOSCOPY (EGD) (Left) as a surgical intervention .  The patient's history has been reviewed, patient examined, no change in status, stable for surgery.  I have reviewed the patient's chart and labs.  Questions were answered to the patient's satisfaction.     [name] M  Assessment:  1.  Coffee ground emesis. 2.  History cirrhosis.  Plan:  1.  Endoscopy today. 2. Risks (bleeding, infection, bowel perforation that could require surgery, sedation-related changes in cardiopulmonary systems), benefits (identification and possible treatment of source of symptoms, exclusion of certain causes of symptoms), and alternatives (watchful waiting, radiographic imaging studies, empiric medical treatment) of upper endoscopy (EGD) were explained to patient in detail and patient wishes to proceed.

## 2013-03-13 NOTE — Progress Notes (Signed)
No further coffee ground emesis.  Respiratory status improved.  Will plan EGD later this afternoon.  Clear liquids ok until noon, then NPO.  Case reviewed with patient and he is in agreement. 

## 2013-03-13 NOTE — Progress Notes (Addendum)
[facility] PROGRESS NOTE  [name] WJX:[id number] DOB: Sep 28, 1955 DOA: 03/11/2013  PCP: Lonia Blood, MD  Brief HPI: Patient presented with nausea and vomiting of "coffee-ground" emesis. He also noticed blood in his stools. He has been drinking alcohol. Per old records he has had an ulcer in the past along with varices.   Past medical history:  Past Medical History  Diagnosis Date  . Neuropathy   . Diabetes mellitus   . Bipolar affect, depressed   . Hypertension   . Arthritis   . Stroke     Mini stroke about 79yrs ago  . Cirrhosis   . Alcohol abuse   . Chronic pain   . Cocaine abuse   . Muscle spasm     both legs    Consultants:  [facility]  Procedures:  None yet  Antibiotics: None  Subjective: Patient feels fine. He denies further episodes of bleeding. Complains of toothache. Denies any abdominal pain or nausea or vomiting.  Objective: Vital Signs  Filed Vitals:   03/13/13 0406 03/13/13 0600 03/13/13 0800 03/13/13 0829  BP: 144/74 136/74 135/71   Pulse: 90 89 86 85  Temp: 98 F (36.7 C)     TempSrc: Oral     Resp: 20 19 23 15   Height:      Weight: 108.5 kg (239 lb 3.2 oz)     SpO2: 94% 94% 95% 96%    Filed Weights   03/11/13 2100 03/13/13 0406  Weight: 109.9 kg (242 lb 4.6 oz) 108.5 kg (239 lb 3.2 oz)    Intake/Output from previous day: 10/21 0701 - 10/22 0700 In: 1250.2 [I.V.:1200; IV Piggyback:50.2] Out: 1800 [Urine:1800]  General appearance: alert, cooperative, appears stated age, no distress and moderately obese Head: Normocephalic, without obvious abnormality, atraumatic Eyes: no pallor or icterus Resp: clear to auscultation bilaterally Cardio: regular rate and rhythm, S1, S2 normal, no murmur, click, rub or gallop GI: soft, non-tender; bowel sounds normal; no masses,  no organomegaly Extremities: extremities normal, atraumatic, no cyanosis or edema Neurologic: Alert and oriented x 3. No focal deficits.  Lab Results:  Basic  Metabolic Panel:  Recent Labs Lab 03/11/13 1703 03/12/13 0430  NA 134* 136  K 3.7 3.7  CL 98 105  CO2 22 24  GLUCOSE 247* 180*  BUN 12 15  CREATININE 0.71 0.69  CALCIUM 7.8* 7.2*   Liver Function Tests:  Recent Labs Lab 03/11/13 1703 03/12/13 0430  AST 89* 86*  ALT 30 26  ALKPHOS 163* 139*  BILITOT 3.9* 4.7*  PROT 6.6 5.8*  ALBUMIN 2.3* 2.0*   No results found for this basename: LIPASE, AMYLASE,  in the last 168 hours  Recent Labs Lab 03/11/13 1706  AMMONIA 71*   CBC:  Recent Labs Lab 03/11/13 1703 03/11/13 2345 03/12/13 0430 03/12/13 1330 03/12/13 2313 03/13/13 0535  WBC 4.3 4.4 4.2 3.2* 3.5* 3.4*  NEUTROABS 3.1  --   --   --   --   --   HGB 11.3* 10.5* 9.8* 10.0* 9.9* 9.4*  HCT 33.2* 30.8* 29.3* 29.3* 28.9* 27.7*  MCV 94.1 94.5 95.4 94.5 95.4 95.5  PLT 48* 41* 35* 38* 38* 35*   CBG:  Recent Labs Lab 03/12/13 1958 03/12/13 2320 03/13/13 0008 03/13/13 0407 03/13/13 0735  GLUCAP 125* 88 94 91 88    Recent Results (from the past 240 hour(s))  MRSA PCR SCREENING     Status: None   Collection Time    03/11/13  9:19 PM  Result Value Range Status   MRSA by PCR NEGATIVE  NEGATIVE Final   Comment:            The GeneXpert MRSA Assay (FDA     approved for NASAL specimens     only), is one component of a     comprehensive MRSA colonization     surveillance program. It is not     intended to diagnose MRSA     infection nor to guide or     monitor treatment for     MRSA infections.      Studies/Results: Dg Chest Portable 1 View  03/11/2013   CLINICAL DATA:  Rectal bleeding  EXAM: PORTABLE CHEST - 1 VIEW  COMPARISON:  12/2012  FINDINGS: 1733 hr. Lung volumes are low on this lordotic film. No edema or focal airspace consolidation. Cardiopericardial silhouette is at upper limits of normal for size. Imaged bony structures of the thorax are intact. Telemetry leads overlie the chest.  IMPRESSION: No acute cardiopulmonary findings.    Electronically Signed   By: Kennith Center M.D.   On: 03/11/2013 17:47    Medications:  Scheduled: . folic acid (FOLVITE) IVPB  1 mg Intravenous Daily  . [START ON 03/14/2013] folic acid  1 mg Intravenous Daily  . influenza vac split quadrivalent PF  0.5 mL Intramuscular Tomorrow-1000  . insulin aspart  0-15 Units Subcutaneous Q4H  . LORazepam  0-4 mg Intravenous Q6H   Followed by  . LORazepam  0-4 mg Intravenous Q12H  . [START ON 03/15/2013] pantoprazole (PROTONIX) IV  40 mg Intravenous Q12H  . sodium chloride  3 mL Intravenous Q12H  . thiamine  100 mg Intravenous Daily   Continuous: . sodium chloride 20 mL/hr at 03/13/13 0826  . octreotide (SANDOSTATIN) infusion 50 mcg/hr (03/13/13 0700)  . pantoprozole (PROTONIX) infusion 8 mg/hr (03/13/13 0700)   ZOX:[id number], LORazepam  Assessment/Plan:  Active Problems:   Alcohol abuse   Acute GI bleeding   Diabetes mellitus   Thrombocytopenia   Depression   Ulcer, stomach peptic   Cirrhosis   Serum ammonia increased   Tobacco abuse    Upper GI Bleeding Likely from PUD or varices. Gi following. ON PPI and octreotide. Remains NPO. Possible EGD today. Hgb stable.  Anemia due to acute blood loss Hgb did drop some but remains stable. No need for transfusion. Monitor.  Alcoholic Liver Cirrhosis/Chronic Alcoholism/Thrombocytopenia Stable. Unfortunately patient continues to drink alcohol though he says "not heavily". He has been counseled multiple times. Thiamine. No signs or symptoms of withdrawal. Monitor.  Dm2 Hold Lantus while NPO as CBG running low. Monitor.  Patient told to follow up with a dentist for toothache  Code Status: Full Code DVT Prophylaxis: SCD's    Family Communication: Discussed with patient  Disposition Plan: Remain in SDU for now till GI plan is outlined. Likely discharge in 1-2 days.    LOS: 2 days   [facility]  [facility] Pager [phone] 03/13/2013, 8:56 AM  If 8PM-8AM, please contact  night-coverage at [email or web address], password [facility]

## 2013-03-14 ENCOUNTER — Encounter (HOSPITAL_COMMUNITY): Payer: Self-pay | Admitting: Gastroenterology

## 2013-03-14 LAB — COMPREHENSIVE METABOLIC PANEL
ALT: 29 U/L (ref 0–53)
AST: 105 U/L — ABNORMAL HIGH (ref 0–37)
Albumin: 2.1 g/dL — ABNORMAL LOW (ref 3.5–5.2)
CO2: 23 mEq/L (ref 19–32)
Chloride: 100 mEq/L (ref 96–112)
Creatinine, Ser: 0.88 mg/dL (ref 0.50–1.35)
GFR calc Af Amer: >90 mL/min (ref >90)
GFR calc non Af Amer: >90 mL/min (ref >90)
Sodium: 132 mEq/L — ABNORMAL LOW (ref 135–145)
Total Bilirubin: 4.5 mg/dL — ABNORMAL HIGH (ref 0.3–1.2)
Total Protein: 6.2 g/dL (ref 6.0–8.3)

## 2013-03-14 LAB — GLUCOSE, CAPILLARY: Glucose-Capillary: 194 mg/dL — ABNORMAL HIGH (ref 70–99)

## 2013-03-14 LAB — CBC
HCT: 30.4 % — ABNORMAL LOW (ref 39.0–52.0)
Platelets: 45 10*3/uL — ABNORMAL LOW (ref 150–400)
RBC: 3.16 MIL/uL — ABNORMAL LOW (ref 4.22–5.81)
RDW: 19.5 % — ABNORMAL HIGH (ref 11.5–15.5)
WBC: 3.7 10*3/uL — ABNORMAL LOW (ref 4.0–10.5)

## 2013-03-14 MED ORDER — LORAZEPAM 1 MG PO TABS: 1.0000 mg | ORAL_TABLET | Freq: Four times a day (QID) | ORAL | Status: DC | PRN

## 2013-03-14 MED ORDER — PROPRANOLOL HCL 10 MG PO TABS: 10.0000 mg | ORAL_TABLET | Freq: Two times a day (BID) | ORAL | Status: DC

## 2013-03-14 MED ORDER — PANTOPRAZOLE SODIUM 40 MG PO TBEC: 40.0000 mg | DELAYED_RELEASE_TABLET | Freq: Two times a day (BID) | ORAL | Status: DC

## 2013-03-14 NOTE — Op Note (Signed)
[facility] [address] [place], [address]   ENDOSCOPY PROCEDURE REPORT  PATIENT: [name], [name]  MR#: [id number] BIRTHDATE: 11-13-1955 , 57  yrs. old GENDER: Male ENDOSCOPIST: Willis Modena, MD REFERRED BY:  [facility] PROCEDURE DATE:  03/13/2013 PROCEDURE:  EGD, diagnostic ASA CLASS:     Class III INDICATIONS:  coffee ground emesis, history cirrhosis. MEDICATIONS: Fentanyl 50 mcg IV and Versed 4 mg IV TOPICAL ANESTHETIC: Cetacaine Spray DESCRIPTION OF PROCEDURE: After the risks benefits and alternatives of the procedure were thoroughly explained, informed consent was obtained.  The Pentax Gastroscope Peds [id number] endoscope was introduced through the mouth and advanced to the second portion of the duodenum. Without limitations.  The instrument was slowly withdrawn as the mucosa was fully examined.   Findings:  Small distal esophageal varices, completely flatten with insufflation, too small to band.  Small red spot over flattened varix in distal esophagus, unclear significance.  Moderate diffuse portal gastropathy.  No gastric varices.  Diffuse intramural edema of stomach and duodenum, likely reflective of patient's hypoalbuminemia.  Otherwise normal endoscopy to the second portion of the duodenum.   No old or fresh blood seen to the extent of our examination.          The scope was then withdrawn from the patient and the procedure completed.  ENDOSCOPIC IMPRESSION:     As above.  Bleeding could potentially have been from esophageal varices (which have subsequently flattened), given red spot seen.   Varices at present too small to band.  RECOMMENDATIONS:     1.  Watch for potential complications of procedure. 2.  Propranolol 10 mg po bid. 3.  Advance diet. 4.  If no further troubles, patient can likely be discharged tomorrow from GI perspective, with outpatient GI follow-up. 5.  [facility] to follow.  eSigned:  Willis Modena, MD  03/13/2013 12:13 PM CC

## 2013-03-14 NOTE — Progress Notes (Signed)
Subjective: No further hematemesis. Tolerating diet.  Objective: Vital signs in last 24 hours: Temp:  [98 F (36.7 C)-98.8 F (37.1 C)] 98 F (36.7 C) (10/23 0423) Pulse Rate:  [71-94] 71 (10/23 0423) Resp:  [13-22] 19 (10/23 0423) BP: (113-159)/(56-88) 113/61 mmHg (10/23 0423) SpO2:  [92 %-100 %] 92 % (10/23 0423) Weight:  [108.5 kg (239 lb 3.2 oz)] 108.5 kg (239 lb 3.2 oz) (10/23 0423) Weight change: 0 kg (0 lb) Last BM Date: 03/12/13  PE: GEN:  NAD, chronically ill-appearing  Lab Results: CBC    Component Value Date/Time   WBC 3.7* 03/14/2013 0500   WBC 3.7* 04/16/2012 2022   RBC 3.16* 03/14/2013 0500   RBC 3.77* 04/16/2012 2022   HGB 10.2* 03/14/2013 0500   HGB 10.2* 04/16/2012 2022   HCT 30.4* 03/14/2013 0500   HCT 33.4* 04/16/2012 2022   PLT 45* 03/14/2013 0500   MCV 96.2 03/14/2013 0500   MCV 88.5 04/16/2012 2022   MCH 32.3 03/14/2013 0500   MCH 27.1 04/16/2012 2022   MCHC 33.6 03/14/2013 0500   MCHC 30.5* 04/16/2012 2022   RDW 19.5* 03/14/2013 0500   LYMPHSABS 0.6* 03/11/2013 1703   MONOABS 0.5 03/11/2013 1703   EOSABS 0.0 03/11/2013 1703   BASOSABS 0.0 03/11/2013 1703   Assessment:  1.  Cirrhosis. 2.  Hematemesis.  Possibly related to esophageal varices.  Plan:  1.  Propranolol 10 mg po bid, now and upon discharge. 2.  OK to discharge today from GI perspective; will need outpatient follow-up with Dr. Dulce Sellar [phone], [facility]) in the next few weeks; discussed with Dr. Rito Ehrlich ([facility]).   [name] 03/14/2013, 8:44 AM

## 2013-03-14 NOTE — Progress Notes (Signed)
Pt d/c instructions reviewed with pt and his wife. All questions asked Copy of instructions and scripts given to pt, pt d/c'd via wheelchair with belongings, escorted by hospital volunteer.

## 2013-03-14 NOTE — Discharge Summary (Signed)
[facility]  Physician Discharge Summary   Patient ID: [name] MRN: [id number] DOB/AGE: 1956/01/06 57 y.o.  Admit date: 03/11/2013 Discharge date: 03/14/2013  PCP: Lonia Blood, MD  DISCHARGE DIAGNOSES:  Active Problems:   Alcohol abuse   Acute GI bleeding   Diabetes mellitus   Thrombocytopenia   Depression   Ulcer, stomach peptic   Cirrhosis   Serum ammonia increased   Tobacco abuse   RECOMMENDATIONS FOR OUTPATIENT FOLLOW UP: 1. Patient needs to see a dentist for problems with his teeth 2. He has been told to avoid alcohol intake 3. Propranolol has been started for varices/portal HTN  DISCHARGE CONDITION: fair  Diet recommendation: Mod Carb  Filed Weights   03/11/13 2100 03/13/13 0406 03/14/13 0423  Weight: 109.9 kg (242 lb 4.6 oz) 108.5 kg (239 lb 3.2 oz) 108.5 kg (239 lb 3.2 oz)    INITIAL HISTORY: Patient presented with nausea and vomiting of "coffee-ground" emesis. He also noticed blood in his stools. He has been drinking alcohol. Per old records he has had an ulcer in the past along with varices.  Consultations:  [facility] (Dr. Dulce Sellar)  Procedures:  EGD 10/22: Flat varix. No active bleeding. Final report pending.  HOSPITAL COURSE:   Upper GI Bleeding  Likely from PUD or varices. No active bleeding noted on EGD. Discussed with Dr. Dulce Sellar. Report not available in EMR yet. Propranolol started by GI. He should continue PPI for previous PUD. He was initiated on octreotide infusion at admission.   Anemia due to acute blood loss  Hgb did drop some but remains stable. He did not require transfusion.   Alcoholic Liver Cirrhosis/Chronic Alcoholism/Thrombocytopenia  This issue remains stable. Unfortunately patient continues to drink alcohol though he says "not heavily". He has been counseled multiple times. No signs or symptoms of withdrawal are noted.  Diabetes Mellitus type 2  He may resume his Lantus.   Toothache and bleeding He bit his cheeks  which resulted in bleeding as well. No active bleeding noted today. He has been given name for a dentist.  He remains stable. Hgb is stable. Platelet count is stable. He is ok for discharge.    PERTINENT LABS:  The results of significant diagnostics from this hospitalization (including imaging, microbiology, ancillary and laboratory) are listed below for reference.    Microbiology: Recent Results (from the past 240 hour(s))  MRSA PCR SCREENING     Status: None   Collection Time    03/11/13  9:19 PM      Result Value Range Status   MRSA by PCR NEGATIVE  NEGATIVE Final   Comment:            The GeneXpert MRSA Assay (FDA     approved for NASAL specimens     only), is one component of a     comprehensive MRSA colonization     surveillance program. It is not     intended to diagnose MRSA     infection nor to guide or     monitor treatment for     MRSA infections.     Labs: Basic Metabolic Panel:  Recent Labs Lab 03/11/13 1703 03/12/13 0430 03/14/13 0500  NA 134* 136 132*  K 3.7 3.7 3.7  CL 98 105 100  CO2 22 24 23   GLUCOSE 247* 180* 171*  BUN 12 15 13   CREATININE 0.71 0.69 0.88  CALCIUM 7.8* 7.2* 7.3*   Liver Function Tests:  Recent Labs Lab 03/11/13 1703 03/12/13 0430 03/14/13  0500  AST 89* 86* 105*  ALT 30 26 29   ALKPHOS 163* 139* 144*  BILITOT 3.9* 4.7* 4.5*  PROT 6.6 5.8* 6.2  ALBUMIN 2.3* 2.0* 2.1*    Recent Labs Lab 03/11/13 1706  AMMONIA 71*   CBC:  Recent Labs Lab 03/11/13 1703  03/12/13 1330 03/12/13 2313 03/13/13 0535 03/13/13 1940 03/14/13 0500  WBC 4.3  < > 3.2* 3.5* 3.4* 3.6* 3.7*  NEUTROABS 3.1  --   --   --   --   --   --   HGB 11.3*  < > 10.0* 9.9* 9.4* 9.7* 10.2*  HCT 33.2*  < > 29.3* 28.9* 27.7* 28.9* 30.4*  MCV 94.1  < > 94.5 95.4 95.5 96.3 96.2  PLT 48*  < > 38* 38* 35* 39* 45*  < > = values in this interval not displayed.  CBG:  Recent Labs Lab 03/13/13 0735 03/13/13 1310 03/13/13 1604 03/13/13 2140  03/14/13 0048  GLUCAP 88 86 149* 245* 194*     IMAGING STUDIES Dg Chest Portable 1 View  03/11/2013   CLINICAL DATA:  Rectal bleeding  EXAM: PORTABLE CHEST - 1 VIEW  COMPARISON:  12/2012  FINDINGS: 1733 hr. Lung volumes are low on this lordotic film. No edema or focal airspace consolidation. Cardiopericardial silhouette is at upper limits of normal for size. Imaged bony structures of the thorax are intact. Telemetry leads overlie the chest.  IMPRESSION: No acute cardiopulmonary findings.   Electronically Signed   By: Kennith Center M.D.   On: 03/11/2013 17:47    DISCHARGE EXAMINATION: Filed Vitals:   03/13/13 1600 03/13/13 2100 03/14/13 0046 03/14/13 0423  BP: 124/56 151/87 134/71 113/61  Pulse: 85 78 72 71  Temp: 98.4 F (36.9 C) 98.8 F (37.1 C) 98 F (36.7 C) 98 F (36.7 C)  TempSrc: Oral Oral Oral Oral  Resp: 19 17 20 19   Height:      Weight:    108.5 kg (239 lb 3.2 oz)  SpO2: 100% 92% 93% 92%   General appearance: alert, cooperative, appears stated age and no distress Resp: clear to auscultation bilaterally Cardio: regular rate and rhythm, S1, S2 normal, no murmur, click, rub or gallop GI: soft, non-tender; bowel sounds normal; no masses,  no organomegaly Neurologic: No focal deficits. No bleeding in mouth.   DISPOSITION: Home  Discharge Orders   Future Orders Complete By Expires   Diet Carb Modified  As directed    Discharge instructions  As directed    Comments:     Please stay off of alcohol. Please follow up with a dentist for problems with your teeth   Increase activity slowly  As directed       ALLERGIES: No Known Allergies  Current Discharge Medication List    START taking these medications   Details  LORazepam (ATIVAN) 1 MG tablet Take 1 tablet (1 mg total) by mouth every 6 (six) hours as needed for anxiety. Qty: 15 tablet, Refills: 0    propranolol (INDERAL) 10 MG tablet Take 1 tablet (10 mg total) by mouth 2 (two) times daily. Qty: 60 tablet,  Refills: 1      CONTINUE these medications which have CHANGED   Details  pantoprazole (PROTONIX) 40 MG tablet Take 1 tablet (40 mg total) by mouth 2 (two) times daily. Qty: 60 tablet, Refills: 3      CONTINUE these medications which have NOT CHANGED   Details  amitriptyline (ELAVIL) 50 MG tablet Take 50 mg by  mouth at bedtime.    cholecalciferol (VITAMIN D) 1000 UNITS tablet Take 1 tablet (1,000 Units total) by mouth at bedtime. For bone health    diclofenac sodium (VOLTAREN) 1 % GEL Apply 2 g topically daily. Apply to back and legs: For osteoarthritic pain.    gabapentin (NEURONTIN) 100 MG capsule Take 100 mg by mouth 3 (three) times daily.    HYDROcodone-acetaminophen (NORCO/VICODIN) 5-325 MG per tablet Take 2 tablets by mouth every 6 (six) hours as needed for pain.    insulin glargine (LANTUS) 100 UNIT/ML injection Inject 0.45 mLs (45 Units total) into the skin at bedtime. For diabetes management Qty: 10 mL, Refills: 12    lisinopril (PRINIVIL,ZESTRIL) 20 MG tablet Take 1 tablet (20 mg total) by mouth daily. For high blood pressure management    Multiple Vitamin (MULTIVITAMIN WITH MINERALS) TABS tablet Take 1 tablet by mouth daily. (Resume with the vitamins that you have at home): For low vitamin Qty: 30 tablet, Refills: 0    pyridOXINE (VITAMIN B-6) 100 MG tablet Take 1 tablet (100 mg total) by mouth daily. For low B-6 Vitamin supplement    Skin Protectants, Misc. (EUCERIN) cream Apply 1 application topically as needed for dry skin (apply to legs). For dryness Qty: 454 g, Refills: 0    spironolactone (ALDACTONE) 50 MG tablet Take 50 mg by mouth daily.      STOP taking these medications     ALPRAZolam (XANAX) 1 MG tablet        Follow-up Information   Follow up with GARBA,LAWAL, MD. Schedule an appointment as soon as possible for a visit in 4 days.   Specialty:  Internal Medicine   Contact information:   [address] [place] [address] [phone]        Follow up with Myriam Forehand, MD. (call for dental appointment)    Specialty:  Dentistry   Contact information:   [address], [address] [place] [address] [phone]       Follow up with Freddy Jaksch, MD. Schedule an appointment as soon as possible for a visit in 4 weeks. (for liver disease and further problems with bleeding from stomach)    Specialty:  Gastroenterology   Contact information:   [address] [place] [address] [phone]       TOTAL DISCHARGE TIME: 35 mins  [facility]  [facility] Pager [phone]  03/14/2013, 8:21 AM

## 2013-03-14 NOTE — Clinical Social Work Psychosocial (Signed)
Clinical Social Work Department BRIEF PSYCHOSOCIAL ASSESSMENT 03/14/2013  Patient:  [name], [name]     Account Number:  [id number]     Admit date:  03/11/2013  Clinical Social Worker:  Varney Biles  Date/Time:  03/14/2013 10:47 AM  Referred by:  Physician  Date Referred:  03/14/2013 Referred for  Substance Abuse   Other Referral:   Interview type:  Patient Other interview type:    PSYCHOSOCIAL DATA Living Status:  FAMILY Admitted from facility:   Level of care:   Primary support name:  [name] Primary support relationship to patient:  PARTNER Degree of support available:   Good--pt lives with his fiance and has 2 children and 5 grandchildren. One daughter lives in [place], and he & [name] have moved back and forth from [place] to be with her.    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 Pt says when he is unable to obtain Xanax he drinks "to control the shakes." CSW asked pt if he would like to stop drinking and using Xanax. Pt says he does, and he has a sponsor that he is going to call when he gets home from the hospital. Pt also says he has bipolar disorder, and he has been provided a list of psychiatrists and he will call to make an appointment as he believes this will be helpful. Pt says he has been to [facility] before and will seek an appointment there again. CSW provided support and encouragement. CSW provided pt with a list of outpatient substance abuse resources. Pt thanked CSW, saying any resource is appreciated. CSW provided her phone number in case pt has questions. Pt will also go to DSS and apply for Medicaid. Pt expressed he and his fiance might also move to [place] to be with his daughter. Pt says he is getting a ride and will be leaving the hospital today.   Assessment/plan  status:  Information/Referral to [facility] Other assessment/ plan:   Information/referral to community resources:   Pt provided substance abuse outpatient resource list.    PATIENT'S/FAMILY'S RESPONSE TO PLAN OF CARE: Pt grateful for substance abuse resource, and spoke openly about substance use with CSW. Pt expressed motivation to stop drinking and using Xanax, and dictated a plan to CSW.      Maryclare Labrador, MSW, [facility] Clinical Social Worker [phone]

## 2013-03-31 ENCOUNTER — Encounter (HOSPITAL_COMMUNITY): Payer: Self-pay | Admitting: Emergency Medicine

## 2013-03-31 ENCOUNTER — Emergency Department (HOSPITAL_COMMUNITY): Payer: Medicare PPO

## 2013-03-31 ENCOUNTER — Inpatient Hospital Stay (HOSPITAL_COMMUNITY)
Admission: EM | Admit: 2013-03-31 | Discharge: 2013-04-02 | DRG: 442 | Disposition: A | Discharge status: Home Health | Payer: Medicare PPO | Attending: Internal Medicine | Admitting: Internal Medicine

## 2013-03-31 DIAGNOSIS — G8929 Other chronic pain: Secondary | ICD-10-CM | POA: Diagnosis present

## 2013-03-31 DIAGNOSIS — F411 Generalized anxiety disorder: Secondary | ICD-10-CM | POA: Diagnosis present

## 2013-03-31 DIAGNOSIS — K7290 Hepatic failure, unspecified without coma: Principal | ICD-10-CM | POA: Diagnosis present

## 2013-03-31 DIAGNOSIS — G589 Mononeuropathy, unspecified: Secondary | ICD-10-CM | POA: Diagnosis present

## 2013-03-31 DIAGNOSIS — K7460 Unspecified cirrhosis of liver: Secondary | ICD-10-CM

## 2013-03-31 DIAGNOSIS — Z794 Long term (current) use of insulin: Secondary | ICD-10-CM

## 2013-03-31 DIAGNOSIS — M129 Arthropathy, unspecified: Secondary | ICD-10-CM | POA: Diagnosis present

## 2013-03-31 DIAGNOSIS — Z9181 History of falling: Secondary | ICD-10-CM

## 2013-03-31 DIAGNOSIS — D696 Thrombocytopenia, unspecified: Secondary | ICD-10-CM

## 2013-03-31 DIAGNOSIS — F1020 Alcohol dependence, uncomplicated: Secondary | ICD-10-CM | POA: Diagnosis present

## 2013-03-31 DIAGNOSIS — K7682 Hepatic encephalopathy: Principal | ICD-10-CM | POA: Diagnosis present

## 2013-03-31 DIAGNOSIS — Z8673 Personal history of transient ischemic attack (TIA), and cerebral infarction without residual deficits: Secondary | ICD-10-CM

## 2013-03-31 DIAGNOSIS — I10 Essential (primary) hypertension: Secondary | ICD-10-CM | POA: Diagnosis present

## 2013-03-31 DIAGNOSIS — F1410 Cocaine abuse, uncomplicated: Secondary | ICD-10-CM | POA: Diagnosis present

## 2013-03-31 DIAGNOSIS — F17200 Nicotine dependence, unspecified, uncomplicated: Secondary | ICD-10-CM | POA: Diagnosis present

## 2013-03-31 DIAGNOSIS — K7030 Alcoholic cirrhosis of liver without ascites: Secondary | ICD-10-CM | POA: Diagnosis present

## 2013-03-31 DIAGNOSIS — E119 Type 2 diabetes mellitus without complications: Secondary | ICD-10-CM | POA: Diagnosis present

## 2013-03-31 DIAGNOSIS — F3130 Bipolar disorder, current episode depressed, mild or moderate severity, unspecified: Secondary | ICD-10-CM | POA: Diagnosis present

## 2013-03-31 DIAGNOSIS — R531 Weakness: Secondary | ICD-10-CM

## 2013-03-31 DIAGNOSIS — D61818 Other pancytopenia: Secondary | ICD-10-CM | POA: Diagnosis present

## 2013-03-31 LAB — COMPREHENSIVE METABOLIC PANEL WITH GFR
ALT: 21 U/L (ref 0–53)
AST: 47 U/L — ABNORMAL HIGH (ref 0–37)
Albumin: 2.5 g/dL — ABNORMAL LOW (ref 3.5–5.2)
Alkaline Phosphatase: 103 U/L (ref 39–117)
BUN: 9 mg/dL (ref 6–23)
CO2: 24 meq/L (ref 19–32)
Calcium: 8.7 mg/dL (ref 8.4–10.5)
Chloride: 98 meq/L (ref 96–112)
Creatinine, Ser: 0.8 mg/dL (ref 0.50–1.35)
GFR calc Af Amer: >90 mL/min (ref >90)
GFR calc non Af Amer: >90 mL/min (ref >90)
Glucose, Bld: 268 mg/dL — ABNORMAL HIGH (ref 70–99)
Potassium: 3.8 meq/L (ref 3.5–5.1)
Sodium: 131 meq/L — ABNORMAL LOW (ref 135–145)
Total Bilirubin: 2 mg/dL — ABNORMAL HIGH (ref 0.3–1.2)
Total Protein: 6.4 g/dL (ref 6.0–8.3)

## 2013-03-31 LAB — URINALYSIS, ROUTINE W REFLEX MICROSCOPIC
Bilirubin Urine: NEGATIVE
Glucose, UA: 500 mg/dL — AB
Hgb urine dipstick: NEGATIVE
Ketones, ur: NEGATIVE mg/dL
Leukocytes, UA: NEGATIVE
Nitrite: NEGATIVE
Protein, ur: NEGATIVE mg/dL
Specific Gravity, Urine: 1.007 (ref 1.005–1.030)
Urobilinogen, UA: 1 mg/dL (ref 0.0–1.0)
pH: 5.5 (ref 5.0–8.0)

## 2013-03-31 LAB — CBC
HCT: 28.6 % — ABNORMAL LOW (ref 39.0–52.0)
Hemoglobin: 9.9 g/dL — ABNORMAL LOW (ref 13.0–17.0)
MCV: 91.4 fL (ref 78.0–100.0)
RBC: 3.13 MIL/uL — ABNORMAL LOW (ref 4.22–5.81)
RDW: 16.5 % — ABNORMAL HIGH (ref 11.5–15.5)
WBC: 2.8 10*3/uL — ABNORMAL LOW (ref 4.0–10.5)

## 2013-03-31 LAB — GLUCOSE, CAPILLARY: Glucose-Capillary: 263 mg/dL — ABNORMAL HIGH (ref 70–99)

## 2013-03-31 LAB — APTT: aPTT: 48 s — ABNORMAL HIGH (ref 24–37)

## 2013-03-31 LAB — PROTIME-INR
INR: 1.49 (ref 0.00–1.49)
Prothrombin Time: 17.6 seconds — ABNORMAL HIGH (ref 11.6–15.2)

## 2013-03-31 LAB — AMMONIA: Ammonia: 112 umol/L — ABNORMAL HIGH (ref 11–60)

## 2013-03-31 LAB — LACTIC ACID, PLASMA: Lactic Acid, Venous: 2 mmol/L (ref 0.5–2.2)

## 2013-03-31 MED ORDER — LACTULOSE 10 GM/15ML PO SOLN
10.0000 g | Freq: Once | ORAL | Status: AC
  Administered 2013-04-01: 10 g via ORAL
  Filled 2013-03-31: qty 15

## 2013-03-31 NOTE — ED Provider Notes (Signed)
CSN: [id number]     Arrival date & time 03/31/13  2119 History   First MD Initiated Contact with Patient 03/31/13 2130     Chief Complaint  Patient presents with  . Weakness   (Consider location/radiation/quality/duration/timing/severity/associated sxs/prior Treatment) HPI Comments: 57 yo male with PMH of Cirrhosis, DM, Bipolar, HTN, Stroke, EtOH and Cocaine abuse, chronic pain presents with cc of increasing weakness, increased confusion, and difficulty ambulating.  Pt reports falling twice today. No LOC.     Pt denies cough, CP, SOB, n/v/d.    History is difficult to obtain due to confusion.    Patient is a 57 y.o. male presenting with weakness. The history is provided by the patient. The history is limited by the condition of the patient.  Weakness This is a recurrent problem. The current episode started 2 days ago. The problem occurs constantly. The problem has not changed since onset.Pertinent negatives include no chest pain, no abdominal pain, no headaches and no shortness of breath. Nothing aggravates the symptoms. Treatments tried: Lactulose, insulin.    Past Medical History  Diagnosis Date  . Neuropathy   . Diabetes mellitus   . Bipolar affect, depressed   . Hypertension   . Arthritis   . Stroke     Mini stroke about 28yrs ago  . Cirrhosis   . Alcohol abuse   . Chronic pain   . Cocaine abuse   . Muscle spasm     both legs   Past Surgical History  Procedure Laterality Date  . Fracture surgery      Leg and arm 28yrs ago  . Esophagogastroduodenoscopy  04/04/2012    Procedure: ESOPHAGOGASTRODUODENOSCOPY (EGD);  Surgeon: Hilarie Fredrickson, MD;  Location: [facility] ENDOSCOPY;  Service: Endoscopy;  Laterality: N/A;  . Eye surgery  8 months ago both eyes  . Esophagogastroduodenoscopy Left 03/13/2013    Procedure: ESOPHAGOGASTRODUODENOSCOPY (EGD);  Surgeon: Willis Modena, MD;  Location: [facility] ENDOSCOPY;  Service: Endoscopy;  Laterality: Left;   Family History  Problem Relation Age of  Onset  . Hypotension Mother    History  Substance Use Topics  . Smoking status: Current Every Day Smoker -- 1.00 packs/day for 30 years    Types: Cigarettes    Last Attempt to Quit: 04/06/2012  . Smokeless tobacco: Never Used     Comment: quit   . Alcohol Use: 0.0 oz/week     Comment: 12 pk beer daily    Review of Systems  Constitutional: Positive for activity change and fatigue. Negative for fever, chills, diaphoresis, appetite change and unexpected weight change.  HENT: Positive for congestion.   Eyes: Negative.   Respiratory: Negative.  Negative for apnea, choking, chest tightness and shortness of breath.   Cardiovascular: Negative for chest pain, palpitations and leg swelling.  Gastrointestinal: Positive for abdominal distention. Negative for nausea, vomiting, abdominal pain, diarrhea and constipation.  Endocrine: Negative.   Genitourinary: Negative.   Neurological: Positive for weakness. Negative for seizures, speech difficulty, light-headedness, numbness and headaches.  Psychiatric/Behavioral: Positive for confusion.    Allergies  Review of patient's allergies indicates no known allergies.  Home Medications   Current Outpatient Rx  Name  Route  Sig  Dispense  Refill  . ALPRAZolam (XANAX) 1 MG tablet   Oral   Take 1 mg by mouth every 6 (six) hours as needed for anxiety.          . diclofenac sodium (VOLTAREN) 1 % GEL   Topical   Apply 1 application topically daily  as needed (back and leg pain).         Marland Kitchen HYDROcodone-acetaminophen (NORCO/VICODIN) 5-325 MG per tablet   Oral   Take 1 tablet by mouth 2 (two) times daily as needed (pain).          . Insulin Glargine (LANTUS SOLOSTAR) 100 UNIT/ML SOPN   Subcutaneous   Inject 46 Units into the skin at bedtime.         Marland Kitchen lactulose (CHRONULAC) 10 GM/15ML solution   Oral   Take 20 g by mouth 2 (two) times daily as needed for mild constipation.         Marland Kitchen lisinopril (PRINIVIL,ZESTRIL) 20 MG tablet   Oral    Take 1 tablet (20 mg total) by mouth daily. For high blood pressure management         . Multiple Vitamin (MULTIVITAMIN WITH MINERALS) TABS tablet   Oral   Take 1 tablet by mouth daily. (Resume with the vitamins that you have at home): For low vitamin   30 tablet   0   . pantoprazole (PROTONIX) 40 MG tablet   Oral   Take 1 tablet (40 mg total) by mouth 2 (two) times daily.   60 tablet   3   . spironolactone (ALDACTONE) 50 MG tablet   Oral   Take 50 mg by mouth daily.          BP 131/69  Pulse 93  Temp(Src) 97.9 F (36.6 C) (Oral)  Resp 18  SpO2 94% Physical Exam  Nursing note and vitals reviewed. Constitutional: No distress.  Sleepy, but arousable  HENT:  Head: Normocephalic and atraumatic.  Eyes: EOM are normal. Pupils are equal, round, and reactive to light.  icteris noted  Neck: Normal range of motion. Neck supple. No JVD present.  Cardiovascular: Normal rate and regular rhythm.   Pulmonary/Chest: Effort normal and breath sounds normal. No stridor. He has no wheezes. He has no rales.  Abdominal: He exhibits distension. He exhibits no mass. There is no tenderness. There is no rebound and no guarding. Hernia confirmed negative in the right inguinal area and confirmed negative in the left inguinal area.  Genitourinary: Penis normal. Right testis shows no mass. Left testis shows no mass. Circumcised.  Musculoskeletal: He exhibits edema. He exhibits no tenderness.  Neurological: He is alert. No cranial nerve deficit.  Able to SLR, MAE, follows commands,   Skin: Skin is warm and dry. He is not diaphoretic.    ED Course  Procedures (including critical care time) Labs Review   Date: 03/31/2013  Rate: 93  Rhythm: normal sinus rhythm  QRS Axis: normal  Intervals: normal  ST/T Wave abnormalities: normal  Conduction Disutrbances:prolonged QTc  Narrative Interpretation:   Old EKG Reviewed: unchanged   Results for orders placed during the hospital encounter of  03/31/13  CBC      Result Value Range   WBC 2.8 (*) 4.0 - 10.5 K/uL   RBC 3.13 (*) 4.22 - 5.81 MIL/uL   Hemoglobin 9.9 (*) 13.0 - 17.0 g/dL   HCT 16.1 (*) 09.6 - 04.5 %   MCV 91.4  78.0 - 100.0 fL   MCH 31.6  26.0 - 34.0 pg   MCHC 34.6  30.0 - 36.0 g/dL   RDW 40.9 (*) 81.1 - 91.4 %   Platelets 56 (*) 150 - 400 K/uL  COMPREHENSIVE METABOLIC PANEL      Result Value Range   Sodium 131 (*) 135 - 145 mEq/L   Potassium  3.8  3.5 - 5.1 mEq/L   Chloride 98  96 - 112 mEq/L   CO2 24  19 - 32 mEq/L   Glucose, Bld 268 (*) 70 - 99 mg/dL   BUN 9  6 - 23 mg/dL   Creatinine, Ser 1.61  0.50 - 1.35 mg/dL   Calcium 8.7  8.4 - 09.6 mg/dL   Total Protein 6.4  6.0 - 8.3 g/dL   Albumin 2.5 (*) 3.5 - 5.2 g/dL   AST 47 (*) 0 - 37 U/L   ALT 21  0 - 53 U/L   Alkaline Phosphatase 103  39 - 117 U/L   Total Bilirubin 2.0 (*) 0.3 - 1.2 mg/dL   GFR calc non Af Amer >90  >90 mL/min   GFR calc Af Amer >90  >90 mL/min  AMMONIA      Result Value Range   Ammonia 112 (*) 11 - 60 umol/L  LACTIC ACID, PLASMA      Result Value Range   Lactic Acid, Venous 2.0  0.5 - 2.2 mmol/L  URINALYSIS, ROUTINE W REFLEX MICROSCOPIC      Result Value Range   Color, Urine YELLOW  YELLOW   APPearance CLEAR  CLEAR   Specific Gravity, Urine 1.007  1.005 - 1.030   pH 5.5  5.0 - 8.0   Glucose, UA 500 (*) NEGATIVE mg/dL   Hgb urine dipstick NEGATIVE  NEGATIVE   Bilirubin Urine NEGATIVE  NEGATIVE   Ketones, ur NEGATIVE  NEGATIVE mg/dL   Protein, ur NEGATIVE  NEGATIVE mg/dL   Urobilinogen, UA 1.0  0.0 - 1.0 mg/dL   Nitrite NEGATIVE  NEGATIVE   Leukocytes, UA NEGATIVE  NEGATIVE  PROTIME-INR      Result Value Range   Prothrombin Time 17.6 (*) 11.6 - 15.2 seconds   INR 1.49  0.00 - 1.49  APTT      Result Value Range   aPTT 48 (*) 24 - 37 seconds  GLUCOSE, CAPILLARY      Result Value Range   Glucose-Capillary 263 (*) 70 - 99 mg/dL   CLINICAL DATA: Weakness  EXAM:  CT HEAD WITHOUT CONTRAST  TECHNIQUE:  Contiguous axial  images were obtained from the base of the skull  through the vertex without intravenous contrast.  COMPARISON: Prior CT from 10/06/2012  FINDINGS:  No acute intracranial hemorrhage or large vessel territory infarct.  No mass or midline shift. No hydrocephalus. Extra-axial spaces are  normal. Calvarium is intact. Orbits are within normal limits.  Paranasal sinuses and mastoid air cells are clear.  IMPRESSION:  Normal head CT with no acute intracranial process.  Electronically Signed  By: Rise Mu M.D.  On: 03/31/2013 23:49    MDM   1. Hepatic encephalopathy   2. Weakness    HPI Comments: 57 yo male with PMH of Cirrhosis, DM, Bipolar, HTN, Stroke, EtOH and Cocaine abuse, chronic pain presents with cc of increasing weakness, increased confusion, and difficulty ambulating.  Pt reports falling twice today. No LOC.     Plan for extensive evaluation. EKG, trop, CXR, blood work to include ammonia, lactate.  Urine. CT head.    12:11 AM Pt with elevated ammonia.  Suspect hepatic encephalopathy.  Plan for medicine admit.  VSS.  No issue in ER. The patient appears reasonably stabilized for admission considering the current resources, flow, and capabilities available in the ED at this time, and I doubt any other [facility] requiring further screening and/or treatment in the ED prior to admission.  Pt t/o to Dr. Arnoldo Morale pending admission at 0015.    Darlys Gales, MD 04/01/13 [phone]

## 2013-03-31 NOTE — ED Notes (Signed)
Per EMS: Pt reports he has had increased weakness, falls, and pain over the weekend. Pt report he has Cirrosis and he is starting to have symptoms of high ammonia. Pt reports intermittent confusion, trouble ambulating. Patient currently Ax4, NAD at this time.

## 2013-04-01 ENCOUNTER — Encounter (HOSPITAL_COMMUNITY): Payer: Self-pay | Admitting: Internal Medicine

## 2013-04-01 ENCOUNTER — Inpatient Hospital Stay (HOSPITAL_COMMUNITY): Payer: Medicare PPO

## 2013-04-01 DIAGNOSIS — K7460 Unspecified cirrhosis of liver: Secondary | ICD-10-CM

## 2013-04-01 DIAGNOSIS — E119 Type 2 diabetes mellitus without complications: Secondary | ICD-10-CM

## 2013-04-01 DIAGNOSIS — K7290 Hepatic failure, unspecified without coma: Principal | ICD-10-CM

## 2013-04-01 DIAGNOSIS — I10 Essential (primary) hypertension: Secondary | ICD-10-CM

## 2013-04-01 LAB — BASIC METABOLIC PANEL
CO2: 24 mEq/L (ref 19–32)
Calcium: 8.5 mg/dL (ref 8.4–10.5)
Creatinine, Ser: 0.82 mg/dL (ref 0.50–1.35)
GFR calc Af Amer: >90 mL/min (ref >90)
GFR calc non Af Amer: >90 mL/min (ref >90)
Sodium: 138 mEq/L (ref 135–145)

## 2013-04-01 LAB — GLUCOSE, CAPILLARY
Glucose-Capillary: 201 mg/dL — ABNORMAL HIGH (ref 70–99)
Glucose-Capillary: 182 mg/dL — ABNORMAL HIGH (ref 70–99)

## 2013-04-01 LAB — AMMONIA: Ammonia: 92 umol/L — ABNORMAL HIGH (ref 11–60)

## 2013-04-01 LAB — HEPATIC FUNCTION PANEL
ALT: 20 U/L (ref 0–53)
AST: 49 U/L — ABNORMAL HIGH (ref 0–37)
Alkaline Phosphatase: 96 U/L (ref 39–117)
Bilirubin, Direct: 1 mg/dL — ABNORMAL HIGH (ref 0.0–0.3)
Indirect Bilirubin: 0.9 mg/dL (ref 0.3–0.9)
Total Bilirubin: 1.9 mg/dL — ABNORMAL HIGH (ref 0.3–1.2)

## 2013-04-01 LAB — CBC
MCHC: 34.1 g/dL (ref 30.0–36.0)
MCV: 92.4 fL (ref 78.0–100.0)
Platelets: 58 10*3/uL — ABNORMAL LOW (ref 150–400)
RBC: 3.02 MIL/uL — ABNORMAL LOW (ref 4.22–5.81)
RDW: 16.4 % — ABNORMAL HIGH (ref 11.5–15.5)
WBC: 2.3 10*3/uL — ABNORMAL LOW (ref 4.0–10.5)

## 2013-04-01 MED ORDER — HYDROCODONE-ACETAMINOPHEN 5-325 MG PO TABS
1.0000 | ORAL_TABLET | Freq: Two times a day (BID) | ORAL | Status: DC | PRN
  Administered 2013-04-01: 1 via ORAL
  Filled 2013-04-01: qty 1

## 2013-04-01 MED ORDER — SPIRONOLACTONE 50 MG PO TABS
50.0000 mg | ORAL_TABLET | Freq: Every day | ORAL | Status: DC
  Administered 2013-04-01 – 2013-04-02 (×2): 50 mg via ORAL
  Filled 2013-04-01 (×2): qty 1

## 2013-04-01 MED ORDER — ACETAMINOPHEN 325 MG PO TABS: 650.0000 mg | ORAL_TABLET | Freq: Four times a day (QID) | ORAL | Status: DC | PRN

## 2013-04-01 MED ORDER — LISINOPRIL 20 MG PO TABS
20.0000 mg | ORAL_TABLET | Freq: Every day | ORAL | Status: DC
  Administered 2013-04-01 – 2013-04-02 (×2): 20 mg via ORAL
  Filled 2013-04-01 (×2): qty 1

## 2013-04-01 MED ORDER — DEXTROSE 5 % IV SOLN
1.0000 g | Freq: Every day | INTRAVENOUS | Status: DC
  Administered 2013-04-01: 1 g via INTRAVENOUS
  Filled 2013-04-01 (×2): qty 10

## 2013-04-01 MED ORDER — INSULIN GLARGINE 100 UNIT/ML ~~LOC~~ SOLN
36.0000 [IU] | Freq: Every day | SUBCUTANEOUS | Status: DC
  Administered 2013-04-01: 36 [IU] via SUBCUTANEOUS
  Filled 2013-04-01 (×2): qty 0.36

## 2013-04-01 MED ORDER — DICLOFENAC SODIUM 1 % TD GEL: 1.0000 "application " | Freq: Every day | TRANSDERMAL | Status: DC | PRN

## 2013-04-01 MED ORDER — LACTULOSE 10 GM/15ML PO SOLN
30.0000 g | Freq: Three times a day (TID) | ORAL | Status: DC
  Administered 2013-04-01 – 2013-04-02 (×4): 30 g via ORAL
  Filled 2013-04-01 (×6): qty 45

## 2013-04-01 MED ORDER — VITAMIN B-1 100 MG PO TABS
100.0000 mg | ORAL_TABLET | Freq: Every day | ORAL | Status: DC
  Administered 2013-04-01 – 2013-04-02 (×2): 100 mg via ORAL
  Filled 2013-04-01 (×2): qty 1

## 2013-04-01 MED ORDER — PANTOPRAZOLE SODIUM 40 MG PO TBEC
40.0000 mg | DELAYED_RELEASE_TABLET | Freq: Two times a day (BID) | ORAL | Status: DC
  Administered 2013-04-01 – 2013-04-02 (×4): 40 mg via ORAL
  Filled 2013-04-01 (×4): qty 1

## 2013-04-01 MED ORDER — THIAMINE HCL 100 MG/ML IJ SOLN
100.0000 mg | Freq: Every day | INTRAMUSCULAR | Status: DC
  Filled 2013-04-01: qty 1

## 2013-04-01 MED ORDER — SODIUM CHLORIDE 0.9 % IJ SOLN
3.0000 mL | Freq: Two times a day (BID) | INTRAMUSCULAR | Status: DC
  Administered 2013-04-01 – 2013-04-02 (×4): 3 mL via INTRAVENOUS

## 2013-04-01 MED ORDER — ALPRAZOLAM 0.5 MG PO TABS
1.0000 mg | ORAL_TABLET | Freq: Four times a day (QID) | ORAL | Status: DC | PRN
  Administered 2013-04-01 – 2013-04-02 (×2): 1 mg via ORAL
  Filled 2013-04-01 (×2): qty 2

## 2013-04-01 MED ORDER — ADULT MULTIVITAMIN W/MINERALS CH
1.0000 | ORAL_TABLET | Freq: Every day | ORAL | Status: DC
  Administered 2013-04-01 – 2013-04-02 (×2): 1 via ORAL
  Filled 2013-04-01 (×2): qty 1

## 2013-04-01 MED ORDER — FUROSEMIDE 20 MG PO TABS
20.0000 mg | ORAL_TABLET | Freq: Once | ORAL | Status: DC
  Filled 2013-04-01: qty 1

## 2013-04-01 MED ORDER — INSULIN ASPART 100 UNIT/ML ~~LOC~~ SOLN
0.0000 [IU] | Freq: Three times a day (TID) | SUBCUTANEOUS | Status: DC
  Administered 2013-04-01 (×2): 2 [IU] via SUBCUTANEOUS
  Administered 2013-04-01 – 2013-04-02 (×2): 3 [IU] via SUBCUTANEOUS

## 2013-04-01 MED ORDER — ACETAMINOPHEN 650 MG RE SUPP: 650.0000 mg | Freq: Four times a day (QID) | RECTAL | Status: DC | PRN

## 2013-04-01 MED ORDER — ONDANSETRON HCL 4 MG/2ML IJ SOLN: 4.0000 mg | Freq: Four times a day (QID) | INTRAMUSCULAR | Status: DC | PRN

## 2013-04-01 MED ORDER — ONDANSETRON HCL 4 MG PO TABS: 4.0000 mg | ORAL_TABLET | Freq: Four times a day (QID) | ORAL | Status: DC | PRN

## 2013-04-01 NOTE — H&P (Signed)
[facility] History and Physical  [name] NFA:[id number] DOB: 1955-12-05 DOA: 03/31/2013  Referring physician: ER physician. PCP: Lonia Blood, MD   Chief Complaint: Falls and confusion.  HPI: [name] is a 57 y.o. male with history of cirrhosis of liver and alcoholism who was recently admitted to the hospital for GI bleed 3 weeks ago and at that time patient had EGD which did not show any active bleed was brought to the ER after patient was found to be getting increasingly confused but patient's caretaker and having falls. Patient fell twice over the last 24 hours but did not lose consciousness. In the ER CT head did not show any acute and labs show increased ammonia levels and patient has been admitted for hepatic encephalopathy. On exam patient looks lethargic but easily arousable answered questions and follows commands. Patient has per the caretaker has not been drinking alcohol since his last discharge 3 weeks ago. In addition patient was found to have increasing abdominal girth and lower extremity edema. As per the caretaker patient has been taking his medications as advised. Patient denies any chest pain or shortness of breath.   Review of Systems: As presented in the history of presenting illness, rest negative.  Past Medical History  Diagnosis Date  . Neuropathy   . Diabetes mellitus   . Bipolar affect, depressed   . Hypertension   . Arthritis   . Stroke     Mini stroke about 68yrs ago  . Cirrhosis   . Alcohol abuse   . Chronic pain   . Cocaine abuse   . Muscle spasm     both legs   Past Surgical History  Procedure Laterality Date  . Fracture surgery      Leg and arm 76yrs ago  . Esophagogastroduodenoscopy  04/04/2012    Procedure: ESOPHAGOGASTRODUODENOSCOPY (EGD);  Surgeon: Hilarie Fredrickson, MD;  Location: [facility] ENDOSCOPY;  Service: Endoscopy;  Laterality: N/A;  . Eye surgery  8 months ago both eyes  . Esophagogastroduodenoscopy Left 03/13/2013    Procedure:  ESOPHAGOGASTRODUODENOSCOPY (EGD);  Surgeon: Willis Modena, MD;  Location: [facility] ENDOSCOPY;  Service: Endoscopy;  Laterality: Left;   Social History:  reports that he has been smoking Cigarettes.  He has a 30 pack-year smoking history. He has never used smokeless tobacco. He reports that he drinks alcohol. He reports that he uses illicit drugs (Cocaine). Where does patient live home. Can patient participate in ADLs? Not sure.  No Known Allergies  Family History:  Family History  Problem Relation Age of Onset  . Hypotension Mother       Prior to Admission medications   Medication Sig Start Date End Date Taking? Authorizing Provider  ALPRAZolam Prudy Feeler) 1 MG tablet Take 1 mg by mouth every 6 (six) hours as needed for anxiety.  03/29/13  Yes Historical Provider, MD  diclofenac sodium (VOLTAREN) 1 % GEL Apply 1 application topically daily as needed (back and leg pain).   Yes Historical Provider, MD  HYDROcodone-acetaminophen (NORCO/VICODIN) 5-325 MG per tablet Take 1 tablet by mouth 2 (two) times daily as needed (pain).    Yes Historical Provider, MD  Insulin Glargine (LANTUS SOLOSTAR) 100 UNIT/ML SOPN Inject 46 Units into the skin at bedtime.   Yes Historical Provider, MD  lactulose (CHRONULAC) 10 GM/15ML solution Take 20 g by mouth 2 (two) times daily as needed for mild constipation.   Yes Historical Provider, MD  lisinopril (PRINIVIL,ZESTRIL) 20 MG tablet Take 1 tablet (20 mg total) by mouth  daily. For high blood pressure management 02/06/13  Yes Sanjuana Kava, NP  Multiple Vitamin (MULTIVITAMIN WITH MINERALS) TABS tablet Take 1 tablet by mouth daily. (Resume with the vitamins that you have at home): For low vitamin 02/06/13  Yes Sanjuana Kava, NP  pantoprazole (PROTONIX) 40 MG tablet Take 1 tablet (40 mg total) by mouth 2 (two) times daily. 03/14/13  Yes Osvaldo Shipper, MD  spironolactone (ALDACTONE) 50 MG tablet Take 50 mg by mouth daily. 02/28/13  Yes Historical Provider, MD    Physical  Exam: Filed Vitals:   04/01/13 0015 04/01/13 0045 04/01/13 0115 04/01/13 0145  BP: 137/66 115/61 124/61 125/68  Pulse: 88 92 88 88  Temp:      TempSrc:      Resp:      SpO2: 97% 95% 95% 96%     General:  Well-developed and nourished.  Eyes: Anicteric no pallor.  ENT: No discharge from the ears eyes nose mouth.  Neck: No mass felt.  Cardiovascular: S1-S2 heard.  Respiratory: No rhonchi or crepitations.  Abdomen: Soft nontender bowel sounds present.  Skin: No rash.  Musculoskeletal: Bilateral lower extremity edema.  Psychiatric: Lethargic but answers questions.  Neurologic: Lethargic easily arousable oriented to his name place and time. Moves all extremities.  Labs on Admission:  Basic Metabolic Panel:  Recent Labs Lab 03/31/13 2220  NA 131*  K 3.8  CL 98  CO2 24  GLUCOSE 268*  BUN 9  CREATININE 0.80  CALCIUM 8.7   Liver Function Tests:  Recent Labs Lab 03/31/13 2220  AST 47*  ALT 21  ALKPHOS 103  BILITOT 2.0*  PROT 6.4  ALBUMIN 2.5*   No results found for this basename: LIPASE, AMYLASE,  in the last 168 hours  Recent Labs Lab 03/31/13 2201  AMMONIA 112*   CBC:  Recent Labs Lab 03/31/13 2220  WBC 2.8*  HGB 9.9*  HCT 28.6*  MCV 91.4  PLT 56*   Cardiac Enzymes: No results found for this basename: CKTOTAL, CKMB, CKMBINDEX, TROPONINI,  in the last 168 hours  BNP (last 3 results) No results found for this basename: PROBNP,  in the last 8760 hours CBG:  Recent Labs Lab 03/31/13 2229  GLUCAP 263*    Radiological Exams on Admission: Dg Chest 2 View  04/01/2013   CLINICAL DATA:  Shortness of breath and weakness.  EXAM: CHEST  2 VIEW  COMPARISON:  Chest radiograph performed 03/11/2013  FINDINGS: The lungs are well-aerated. Pulmonary vascularity is at the upper limits of normal. There is no evidence of focal opacification, pleural effusion or pneumothorax.  The heart is normal in size; the mediastinal contour is within normal limits.  No acute osseous abnormalities are seen.  IMPRESSION: No active cardiopulmonary disease.   Electronically Signed   By: Roanna Raider M.D.   On: 04/01/2013 00:12   Ct Head Wo Contrast - If Head Trauma Is Known/suspected And/or Pt Is Taking Anticoagulant  03/31/2013   CLINICAL DATA:  Weakness  EXAM: CT HEAD WITHOUT CONTRAST  TECHNIQUE: Contiguous axial images were obtained from the base of the skull through the vertex without intravenous contrast.  COMPARISON:  Prior CT from 10/06/2012  FINDINGS: No acute intracranial hemorrhage or large vessel territory infarct. No mass or midline shift. No hydrocephalus. Extra-axial spaces are normal. Calvarium is intact. Orbits are within normal limits. Paranasal sinuses and mastoid air cells are clear.  IMPRESSION: Normal head CT with no acute intracranial process.   Electronically Signed   By:  Rise Mu M.D.   On: 03/31/2013 23:49     Assessment/Plan Principal Problem:   Hepatic encephalopathy Active Problems:   HTN (hypertension)   Diabetes mellitus   Cirrhosis   1. Hepatic encephalopathy - patient has been placed on lactulose 30 g by mouth 3 times a day for now. Closely follow mental status and ammonia levels. 2. Cirrhosis of liver with increasing peripheral edema - I have ordered one dose of Lasix 20 mg orally and continue spironolactone. Based on patient's response to Lasix further doses to be decided and probably there is no significant worsening in his creatinine may increase spironolactone dose. I have also ordered sonogram of the abdomen to check for any definite ascites and also for any liver pathology. If there is significant ascites patient will need paracentesis. For now I have placed patient on ceftriaxone. 3. Pancytopenia - probably from #2 and history of alcoholism. 4. History of alcoholism - as per the care taker and patient patient has not been having alcohol for last 3 weeks. Patient has been presently placed on  thiamine. 5. Diabetes mellitus type 2 - continue home medications with close followup of CBGs. 6. Hypertension - continue home medications. 7. Recently admitted for GI bleed.    Code Status: Full code.   Disposition Plan: Admit to inpatient.    Jaxtyn Linville N. [facility] Pager [phone].  If 7PM-7AM, please contact night-coverage [email or web address] Password [email or web address] 04/01/2013, 2:04 AM

## 2013-04-01 NOTE — Progress Notes (Signed)
Patient seen and examined. Admission H&P reviewed.  appears stable. Has mild flapping tremors. continue scheduled lactulose with titration to at least 3 daily BMs. i do not see lactulose on his med list on admission and even on previous hospitalization.  Counseled on etoh cessation  follow US abdomen.   currently mental status normal. Counseled on etoh cessation

## 2013-04-01 NOTE — ED Notes (Signed)
Admitting provider at bedside.

## 2013-04-02 DIAGNOSIS — D696 Thrombocytopenia, unspecified: Secondary | ICD-10-CM

## 2013-04-02 LAB — GLUCOSE, CAPILLARY: Glucose-Capillary: 226 mg/dL — ABNORMAL HIGH (ref 70–99)

## 2013-04-02 MED ORDER — LISINOPRIL 40 MG PO TABS: 40.0000 mg | ORAL_TABLET | Freq: Every day | ORAL | Status: DC

## 2013-04-02 MED ORDER — LACTULOSE 10 GM/15ML PO SOLN: 20.0000 g | Freq: Three times a day (TID) | ORAL | Status: DC

## 2013-04-02 MED ORDER — SPIRONOLACTONE 100 MG PO TABS: 50.0000 mg | ORAL_TABLET | Freq: Every day | ORAL | Status: DC

## 2013-04-02 MED ORDER — PROPRANOLOL HCL 10 MG PO TABS: 10.0000 mg | ORAL_TABLET | Freq: Two times a day (BID) | ORAL | Status: DC

## 2013-04-02 MED ORDER — SPIRONOLACTONE 100 MG PO TABS: 100.0000 mg | ORAL_TABLET | Freq: Every day | ORAL | Status: DC

## 2013-04-02 MED ORDER — LISINOPRIL 40 MG PO TABS: 20.0000 mg | ORAL_TABLET | Freq: Every day | ORAL | Status: DC

## 2013-04-02 MED ORDER — AMLODIPINE BESYLATE 5 MG PO TABS: 5.0000 mg | ORAL_TABLET | Freq: Every day | ORAL | Status: DC

## 2013-04-02 MED ORDER — FUROSEMIDE 40 MG PO TABS: 40.0000 mg | ORAL_TABLET | Freq: Every day | ORAL | Status: DC

## 2013-04-02 NOTE — Discharge Summary (Signed)
Physician Discharge Summary  [name] ZOX:[id number] DOB: 08-Mar-1956 DOA: 03/31/2013  PCP: Lonia Blood, MD  Admit date: 03/31/2013 Discharge date: 04/02/2013  Time spent: 40 minutes   Recommendations for Outpatient Follow-up:  Home with outpt PCP follow up Please follow up with [facility] (Dr. Dulce Sellar in 2 weeks)  Discharge Diagnoses:  Principal Problem:   Hepatic encephalopathy  Active Problems:   HTN (hypertension)   Diabetes mellitus Alcoholic cirrhosis of liver   Discharge Condition: low Sodium  Diet recommendation: Regular  Filed Weights   04/01/13 0222 04/01/13 2053  Weight: 115.44 kg (254 lb 8 oz) 115.44 kg (254 lb 8 oz)    History of present illness:  57 y.o. male with history of cirrhosis of liver and alcoholism who was recently admitted to the hospital for GI bleed 3 weeks ago and at that time patient had EGD which did not show any active bleed was brought to the ER after patient was found to be getting increasingly confused by patient's caretaker and having falls. Patient fell twice over the last 24 hours but did not lose consciousness. In the ER CT head did not show any acute and labs show increased ammonia levels and patient has been admitted for hepatic encephalopathy. On exam patient looked lethargic but easily arousable answered questions and follows commands. Patient has per the caretaker has not been drinking alcohol since his last discharge 3 weeks ago. In addition patient was found to have increasing abdominal girth and lower extremity edema. As per the caretaker patient has been taking his medications as advised. Patient denies any chest pain or shortness of breath.    Hospital Course:  Hepatic encephalopathy Patient admitted to medical floor and started on scheduled lactulose 30 mg 2 times a day. His ammonia level was elevated on presentation. Symptoms started to improve and he has been having good bowel movements. He is now oriented to baseline. He was not  on lactulose during his recent hospitalization and perhaps was not started on it until recently. Patient instructed to continue lactulose regularly and make sure of having at least 3 bowel movements daily. He needs to followup with [facility] Dr. Dulce Sellar as outpatient. -As instructed to minimize his alprazolam for anxiety.  Alcoholic cirrhosis of liver Patient recently hospitalized for hematemesis. Ultrasound dorsal is CO2 delivery without ascites. I would discharge him on Lasix and Aldactone . During his recent  Discharge, he was started on propranolol 10 mg twice daily which should be continued. He needs to follow up with GI. -Continue PPI twice a day  Uncontrolled hypertension I will increase his lisinopril dose to 40 mg daily. I'll start him on Lasix and Aldactone as well.  Diabetes mellitus Continue home dose insulin.  pancytopenia Secondary to alcoholism. Currently stable  Patient stable to be discharged home with outpatient followup.   Procedures:  None  Consultations:  none  Discharge Exam: Filed Vitals:   04/02/13 0823  BP: 137/100  Pulse: 90  Temp: 98.7 F (37.1 C)  Resp: 18    General: Middle aged male in no acute distress HEENT: No pallor, moist oral mucosa, no icterus, Cardiovascular: Normal S1 and S2, no murmurs rub or gallop Chest: Clear to auscultation bilaterally, Or sounds Abdomen: Soft, nondistended, nontender bowel sounds present Extremities: Warm, 1+ pitting edema bilaterally CNS: AAO x3, no flapping tremors    Discharge Instructions     Medication List         ALPRAZolam 1 MG tablet  Commonly known as:  XANAX  Take 1 mg by mouth every 6 (six) hours as needed for anxiety.     diclofenac sodium 1 % Gel  Commonly known as:  VOLTAREN  Apply 1 application topically daily as needed (back and leg pain).     furosemide 40 MG tablet  Commonly known as:  LASIX  Take 1 tablet (40 mg total) by mouth daily.     HYDROcodone-acetaminophen 5-325  MG per tablet  Commonly known as:  NORCO/VICODIN  Take 1 tablet by mouth 2 (two) times daily as needed (pain).     lactulose 10 GM/15ML solution  Commonly known as:  CHRONULAC  Take 30 mLs (20 g total) by mouth 3 (three) times daily.     LANTUS SOLOSTAR 100 UNIT/ML Sopn  Generic drug:  Insulin Glargine  Inject 46 Units into the skin at bedtime.     lisinopril 40 MG tablet  Commonly known as:  PRINIVIL,ZESTRIL  Take 1tablets (40 mg total) by mouth daily. For high blood pressure management     multivitamin with minerals Tabs tablet  Take 1 tablet by mouth daily. (Resume with the vitamins that you have at home): For low vitamin     pantoprazole 40 MG tablet  Commonly known as:  PROTONIX  Take 1 tablet (40 mg total) by mouth 2 (two) times daily.     spironolactone 100 MG tablet  Commonly known as:  ALDACTONE  Take 1 tablets (100 mg total) by mouth daily.      propranolol 10 mg po  1 tablet bid  No Known Allergies     Follow-up Information   Follow up with GARBA,LAWAL, MD. Call in 1 week.   Specialty:  Internal Medicine   Contact information:   [address] [place] [address] [phone]        The results of significant diagnostics from this hospitalization (including imaging, microbiology, ancillary and laboratory) are listed below for reference.    Significant Diagnostic Studies: Dg Chest 2 View  04/01/2013   CLINICAL DATA:  Shortness of breath and weakness.  EXAM: CHEST  2 VIEW  COMPARISON:  Chest radiograph performed 03/11/2013  FINDINGS: The lungs are well-aerated. Pulmonary vascularity is at the upper limits of normal. There is no evidence of focal opacification, pleural effusion or pneumothorax.  The heart is normal in size; the mediastinal contour is within normal limits. No acute osseous abnormalities are seen.  IMPRESSION: No active cardiopulmonary disease.   Electronically Signed   By: Roanna Raider M.D.   On: 04/01/2013 00:12   Ct Head Wo Contrast  - If Head Trauma Is Known/suspected And/or Pt Is Taking Anticoagulant  03/31/2013   CLINICAL DATA:  Weakness  EXAM: CT HEAD WITHOUT CONTRAST  TECHNIQUE: Contiguous axial images were obtained from the base of the skull through the vertex without intravenous contrast.  COMPARISON:  Prior CT from 10/06/2012  FINDINGS: No acute intracranial hemorrhage or large vessel territory infarct. No mass or midline shift. No hydrocephalus. Extra-axial spaces are normal. Calvarium is intact. Orbits are within normal limits. Paranasal sinuses and mastoid air cells are clear.  IMPRESSION: Normal head CT with no acute intracranial process.   Electronically Signed   By: Rise Mu M.D.   On: 03/31/2013 23:49   US Abdomen Complete  04/01/2013   CLINICAL DATA:  57 year old male with increased abdominal girth in the setting of cirrhosis. Ascites suspected. Initial encounter.  EXAM: ULTRASOUND ABDOMEN COMPLETE  COMPARISON:  Chest CTA 12/13/2012.  FINDINGS: Gallbladder  Mild wall thickening, up to 3 mm. No pericholecystic fluid. No echogenic stones. No sonographic Murphy sign elicited.  Common bile duct  Diameter: 6 mm, within normal limits  Liver  Coarse and difficult to penetrate. No discrete liver lesion identified. No intrahepatic ductal dilatation.  IVC  No abnormality visualized.  Pancreas  Poorly visible due to overlying bowel gas.  Spleen  Splenomegaly. Splenic volume 808 mL (normal splenic volume range 83 - 412 mL). No discrete splenic lesion identified.  Right Kidney  Length: 12.9 cm. Echogenicity within normal limits. No mass or hydronephrosis visualized.  Left Kidney  Length: 13.9 cm. Echogenicity within normal limits. No mass or hydronephrosis visualized.  Abdominal aorta  Incompletely visualized due to overlying bowel gas, visualized portions within normal limits.  Other findings: No ascites identified. Evidence of abdominal varices re- identified.  IMPRESSION: 1. Cirrhosis with splenomegaly, but no ascites.  2.  No discrete liver lesion.  3. Mild gallbladder wall thickening, likely this sequelae of chronic liver disease.   Electronically Signed   By: Augusto Gamble M.D.   On: 04/01/2013 17:55   Dg Chest Portable 1 View  03/11/2013   CLINICAL DATA:  Rectal bleeding  EXAM: PORTABLE CHEST - 1 VIEW  COMPARISON:  12/2012  FINDINGS: 1733 hr. Lung volumes are low on this lordotic film. No edema or focal airspace consolidation. Cardiopericardial silhouette is at upper limits of normal for size. Imaged bony structures of the thorax are intact. Telemetry leads overlie the chest.  IMPRESSION: No acute cardiopulmonary findings.   Electronically Signed   By: Kennith Center M.D.   On: 03/11/2013 17:47    Microbiology: No results found for this or any previous visit (from the past 240 hour(s)).   Labs: Basic Metabolic Panel:  Recent Labs Lab 03/31/13 2220 04/01/13 0619  NA 131* 138  K 3.8 3.8  CL 98 104  CO2 24 24  GLUCOSE 268* 180*  BUN 9 8  CREATININE 0.80 0.82  CALCIUM 8.7 8.5   Liver Function Tests:  Recent Labs Lab 03/31/13 2220 04/01/13 0619  AST 47* 49*  ALT 21 20  ALKPHOS 103 96  BILITOT 2.0* 1.9*  PROT 6.4 6.3  ALBUMIN 2.5* 2.3*   No results found for this basename: LIPASE, AMYLASE,  in the last 168 hours  Recent Labs Lab 03/31/13 2201 04/01/13 0619  AMMONIA 112* 92*   CBC:  Recent Labs Lab 03/31/13 2220 04/01/13 0619  WBC 2.8* 2.3*  HGB 9.9* 9.5*  HCT 28.6* 27.9*  MCV 91.4 92.4  PLT 56* 58*   Cardiac Enzymes: No results found for this basename: CKTOTAL, CKMB, CKMBINDEX, TROPONINI,  in the last 168 hours BNP: BNP (last 3 results) No results found for this basename: PROBNP,  in the last 8760 hours CBG:  Recent Labs Lab 03/31/13 2229 04/01/13 0844 04/01/13 1210 04/01/13 1632 04/01/13 2059  GLUCAP 263* 167* 201* 182* 305*       Signed:  Jaeson Molstad  [facility] 04/02/2013, 10:24 AM

## 2013-04-02 NOTE — Progress Notes (Signed)
Patient discharged home with girlfriend. Patient and girlfriend were given education on hepatic encephalopathy and prescriptions. Patient was told to stop drinking. Patient was told to contact doctor with questions and concerns. Patient was stable upon discharge.

## 2013-04-10 ENCOUNTER — Emergency Department (HOSPITAL_COMMUNITY): Payer: Medicare PPO

## 2013-04-10 ENCOUNTER — Inpatient Hospital Stay (HOSPITAL_COMMUNITY)
Admission: EM | Admit: 2013-04-10 | Discharge: 2013-04-12 | DRG: 442 | Disposition: A | Discharge status: Home Health | Payer: Medicare PPO | Attending: Internal Medicine | Admitting: Internal Medicine

## 2013-04-10 ENCOUNTER — Encounter (HOSPITAL_COMMUNITY): Payer: Self-pay | Admitting: Emergency Medicine

## 2013-04-10 DIAGNOSIS — E119 Type 2 diabetes mellitus without complications: Secondary | ICD-10-CM | POA: Diagnosis present

## 2013-04-10 DIAGNOSIS — K7290 Hepatic failure, unspecified without coma: Principal | ICD-10-CM | POA: Diagnosis present

## 2013-04-10 DIAGNOSIS — R531 Weakness: Secondary | ICD-10-CM

## 2013-04-10 DIAGNOSIS — E7220 Disorder of urea cycle metabolism, unspecified: Secondary | ICD-10-CM

## 2013-04-10 DIAGNOSIS — F17200 Nicotine dependence, unspecified, uncomplicated: Secondary | ICD-10-CM | POA: Diagnosis present

## 2013-04-10 DIAGNOSIS — F32A Depression, unspecified: Secondary | ICD-10-CM

## 2013-04-10 DIAGNOSIS — Z8673 Personal history of transient ischemic attack (TIA), and cerebral infarction without residual deficits: Secondary | ICD-10-CM

## 2013-04-10 DIAGNOSIS — W19XXXD Unspecified fall, subsequent encounter: Secondary | ICD-10-CM

## 2013-04-10 DIAGNOSIS — F329 Major depressive disorder, single episode, unspecified: Secondary | ICD-10-CM

## 2013-04-10 DIAGNOSIS — F1020 Alcohol dependence, uncomplicated: Secondary | ICD-10-CM

## 2013-04-10 DIAGNOSIS — K259 Gastric ulcer, unspecified as acute or chronic, without hemorrhage or perforation: Secondary | ICD-10-CM

## 2013-04-10 DIAGNOSIS — F3130 Bipolar disorder, current episode depressed, mild or moderate severity, unspecified: Secondary | ICD-10-CM

## 2013-04-10 DIAGNOSIS — R296 Repeated falls: Secondary | ICD-10-CM

## 2013-04-10 DIAGNOSIS — F1010 Alcohol abuse, uncomplicated: Secondary | ICD-10-CM

## 2013-04-10 DIAGNOSIS — Z9181 History of falling: Secondary | ICD-10-CM

## 2013-04-10 DIAGNOSIS — I10 Essential (primary) hypertension: Secondary | ICD-10-CM

## 2013-04-10 DIAGNOSIS — K7682 Hepatic encephalopathy: Principal | ICD-10-CM | POA: Diagnosis present

## 2013-04-10 DIAGNOSIS — K922 Gastrointestinal hemorrhage, unspecified: Secondary | ICD-10-CM

## 2013-04-10 DIAGNOSIS — F1410 Cocaine abuse, uncomplicated: Secondary | ICD-10-CM | POA: Diagnosis present

## 2013-04-10 DIAGNOSIS — Z720 Tobacco use: Secondary | ICD-10-CM

## 2013-04-10 DIAGNOSIS — K7030 Alcoholic cirrhosis of liver without ascites: Secondary | ICD-10-CM | POA: Diagnosis present

## 2013-04-10 DIAGNOSIS — D61818 Other pancytopenia: Secondary | ICD-10-CM | POA: Diagnosis present

## 2013-04-10 DIAGNOSIS — L03119 Cellulitis of unspecified part of limb: Secondary | ICD-10-CM

## 2013-04-10 DIAGNOSIS — D696 Thrombocytopenia, unspecified: Secondary | ICD-10-CM

## 2013-04-10 DIAGNOSIS — W19XXXA Unspecified fall, initial encounter: Secondary | ICD-10-CM

## 2013-04-10 DIAGNOSIS — R45851 Suicidal ideations: Secondary | ICD-10-CM

## 2013-04-10 DIAGNOSIS — F3289 Other specified depressive episodes: Secondary | ICD-10-CM

## 2013-04-10 DIAGNOSIS — K7460 Unspecified cirrhosis of liver: Secondary | ICD-10-CM | POA: Diagnosis present

## 2013-04-10 LAB — COMPREHENSIVE METABOLIC PANEL
Alkaline Phosphatase: 95 U/L (ref 39–117)
BUN: 11 mg/dL (ref 6–23)
Chloride: 105 mEq/L (ref 96–112)
Creatinine, Ser: 0.83 mg/dL (ref 0.50–1.35)
GFR calc Af Amer: >90 mL/min (ref >90)
GFR calc non Af Amer: >90 mL/min (ref >90)
Glucose, Bld: 174 mg/dL — ABNORMAL HIGH (ref 70–99)
Potassium: 4.2 mEq/L (ref 3.5–5.1)
Total Bilirubin: 2.5 mg/dL — ABNORMAL HIGH (ref 0.3–1.2)
Total Protein: 6.8 g/dL (ref 6.0–8.3)

## 2013-04-10 LAB — CBC WITH DIFFERENTIAL/PLATELET
Basophils Absolute: 0 10*3/uL (ref 0.0–0.1)
Basophils Relative: 1 % (ref 0–1)
Eosinophils Absolute: 0.2 10*3/uL (ref 0.0–0.7)
Eosinophils Relative: 7 % — ABNORMAL HIGH (ref 0–5)
HCT: 31.8 % — ABNORMAL LOW (ref 39.0–52.0)
Hemoglobin: 10.9 g/dL — ABNORMAL LOW (ref 13.0–17.0)
Lymphocytes Relative: 23 % (ref 12–46)
Lymphs Abs: 0.7 10*3/uL (ref 0.7–4.0)
MCHC: 34.3 g/dL (ref 30.0–36.0)
MCV: 89.8 fL (ref 78.0–100.0)
Monocytes Relative: 10 % (ref 3–12)
Neutro Abs: 2 10*3/uL (ref 1.7–7.7)
RBC: 3.54 MIL/uL — ABNORMAL LOW (ref 4.22–5.81)
RDW: 15.8 % — ABNORMAL HIGH (ref 11.5–15.5)

## 2013-04-10 LAB — AMMONIA: Ammonia: 93 umol/L — ABNORMAL HIGH (ref 11–60)

## 2013-04-10 LAB — ETHANOL: Alcohol, Ethyl (B): <11 mg/dL (ref 0–11)

## 2013-04-10 LAB — APTT: aPTT: 46 seconds — ABNORMAL HIGH (ref 24–37)

## 2013-04-10 NOTE — ED Provider Notes (Signed)
CSN: [id number]     Arrival date & time 04/10/13  1947 History   First MD Initiated Contact with Patient 04/10/13 2006     Chief Complaint  Patient presents with  . Fatigue  . Medical Clearance   (Consider location/radiation/quality/duration/timing/severity/associated sxs/prior Treatment) Patient is a 57 y.o. male presenting with altered mental status. The history is provided by the patient. The history is limited by the condition of the patient.  Altered Mental Status Patient transported from home via EMS with reports of generalized weakness/confusion. Per EMS family reports SI statements from patient at home. Patient has PMH significant for Bipolar, Depression, Cirrhosis, and Alcohol abuse. Patient is poor historian, level V caveat secondary to his AMS. Patient appears emotionally distress, tearful, and confused. Patient denies SI. Denies alcohol use. Reports falling but is unable to provide details. Admits to hitting his head during fall and admits to tenderness at occipital region. Patient asks for his wife. Wife not present.   Past Medical History  Diagnosis Date  . Neuropathy   . Diabetes mellitus   . Bipolar affect, depressed   . Hypertension   . Arthritis   . Stroke     Mini stroke about 62yrs ago  . Cirrhosis   . Alcohol abuse   . Chronic pain   . Cocaine abuse   . Muscle spasm     both legs   Past Surgical History  Procedure Laterality Date  . Fracture surgery      Leg and arm 32yrs ago  . Esophagogastroduodenoscopy  04/04/2012    Procedure: ESOPHAGOGASTRODUODENOSCOPY (EGD);  Surgeon: Hilarie Fredrickson, MD;  Location: [facility] ENDOSCOPY;  Service: Endoscopy;  Laterality: N/A;  . Eye surgery  8 months ago both eyes  . Esophagogastroduodenoscopy Left 03/13/2013    Procedure: ESOPHAGOGASTRODUODENOSCOPY (EGD);  Surgeon: Willis Modena, MD;  Location: [facility] ENDOSCOPY;  Service: Endoscopy;  Laterality: Left;   Family History  Problem Relation Age of Onset  . Hypotension Mother     History  Substance Use Topics  . Smoking status: Current Every Day Smoker -- 1.00 packs/day for 30 years    Types: Cigarettes    Last Attempt to Quit: 04/06/2012  . Smokeless tobacco: Never Used     Comment: quit   . Alcohol Use: 0.0 oz/week     Comment: 12 pk beer daily    Review of Systems  Unable to perform ROS: Mental status change  Respiratory: Negative for shortness of breath.   Cardiovascular: Negative for chest pain.  Musculoskeletal: Negative for neck pain.    Allergies  Review of patient's allergies indicates no known allergies.  Home Medications   No current outpatient prescriptions on file. BP 147/79  Pulse 80  Temp(Src) 98 F (36.7 C) (Oral)  Resp 18  Ht 5\' 10"  (1.778 m)  SpO2 96% Physical Exam  Constitutional: He appears well-developed and well-nourished.  HENT:  Head: Normocephalic and atraumatic.    Eyes: EOM are normal. Pupils are equal, round, and reactive to light. No scleral icterus.  Neck: Normal range of motion. Neck supple.  Cardiovascular: Normal rate and regular rhythm.  Exam reveals no gallop and no friction rub.   No murmur heard. Patient has bilateral peripheral pitting edema.  Pulmonary/Chest: Effort normal and breath sounds normal.  Abdominal: Soft. Bowel sounds are normal. He exhibits no distension. There is no hepatomegaly. There is no tenderness. There is no tenderness at McBurney's point and negative Murphy's sign.  Mild tenderness to deep palpation at RUQ.  Musculoskeletal: Normal range of motion.  No midline tenderness of the spine. Good AROM of extremities.   Neurological: He is alert. He has normal strength. He displays no tremor. No cranial nerve deficit.  Patient oriented to self. Know the president of the [place]. Answers "[facility]" as location. Answers "October" as current month.   Patient shows no signs of focal neurological deficits. Patient speaks clearly. No slurred speech or unilateral weakness.   Skin:  Skin is warm and dry. No rash noted.     Psychiatric: He expresses no suicidal ideation.  Patient upset and tearful. Confused but partially oriented.     ED Course  Procedures (including critical care time) Labs Review Labs Reviewed  CBC WITH DIFFERENTIAL - Abnormal; Notable for the following:    WBC 3.2 (*)    RBC 3.54 (*)    Hemoglobin 10.9 (*)    HCT 31.8 (*)    RDW 15.8 (*)    Platelets 62 (*)    Eosinophils Relative 7 (*)    All other components within normal limits  COMPREHENSIVE METABOLIC PANEL - Abnormal; Notable for the following:    Glucose, Bld 174 (*)    Albumin 2.7 (*)    AST 50 (*)    Total Bilirubin 2.5 (*)    All other components within normal limits  AMMONIA - Abnormal; Notable for the following:    Ammonia 93 (*)    All other components within normal limits  SALICYLATE LEVEL - Abnormal; Notable for the following:    Salicylate Lvl <2.0 (*)    All other components within normal limits  PROTIME-INR - Abnormal; Notable for the following:    Prothrombin Time 17.4 (*)    All other components within normal limits  APTT - Abnormal; Notable for the following:    aPTT 46 (*)    All other components within normal limits  URINALYSIS, ROUTINE W REFLEX MICROSCOPIC - Abnormal; Notable for the following:    Glucose, UA 250 (*)    Urobilinogen, UA 2.0 (*)    All other components within normal limits  HEPATIC FUNCTION PANEL - Abnormal; Notable for the following:    Albumin 2.6 (*)    AST 48 (*)    Total Bilirubin 2.4 (*)    Bilirubin, Direct 1.1 (*)    Indirect Bilirubin 1.3 (*)    All other components within normal limits  BASIC METABOLIC PANEL - Abnormal; Notable for the following:    Glucose, Bld 148 (*)    All other components within normal limits  CBC - Abnormal; Notable for the following:    WBC 3.3 (*)    RBC 3.29 (*)    Hemoglobin 10.1 (*)    HCT 29.6 (*)    RDW 16.2 (*)    Platelets 61 (*)    All other components within normal limits  GLUCOSE,  CAPILLARY - Abnormal; Notable for the following:    Glucose-Capillary 122 (*)    All other components within normal limits  GLUCOSE, CAPILLARY - Abnormal; Notable for the following:    Glucose-Capillary 205 (*)    All other components within normal limits  ETHANOL  ACETAMINOPHEN LEVEL  LACTIC ACID, PLASMA  TSH  OCCULT BLOOD, POC DEVICE   Imaging Review Dg Chest 2 View  04/10/2013   CLINICAL DATA:  Weakness  EXAM: CHEST  2 VIEW  COMPARISON:  March 31, 2013  FINDINGS: Lungs are clear. Heart is upper normal in size with normal pulmonary vascularity. No adenopathy.  No bone lesions.  IMPRESSION: No edema or consolidation.   Electronically Signed   By: Bretta Bang M.D.   On: 04/10/2013 21:19   Ct Head Wo Contrast  04/10/2013   CLINICAL DATA:  Altered mental status.  Fatigue.  History of stroke.  EXAM: CT HEAD WITHOUT CONTRAST  CT CERVICAL SPINE WITHOUT CONTRAST  TECHNIQUE: Multidetector CT imaging of the head and cervical spine was performed following the standard protocol without intravenous contrast. Multiplanar CT image reconstructions of the cervical spine were also generated.  COMPARISON:  03/31/2013  FINDINGS: CT HEAD FINDINGS  The brainstem, cerebellum, cerebral peduncles, thalamus, basal ganglia, basilar cisterns, and ventricular system appear within normal limits. No intracranial hemorrhage, mass lesion, or acute CVA.  CT CERVICAL SPINE FINDINGS  No cervical spine fracture or significant abnormal subluxation. No prevertebral soft tissue swelling or significant bony lesion observed. Mild degenerative distal clavicular findings along the sternoclavicular joint noted.  IMPRESSION: 1. No significant abnormalities involving the brain or cervical spine.   Electronically Signed   By: Herbie Baltimore M.D.   On: 04/10/2013 21:50   Ct Cervical Spine Wo Contrast  04/10/2013   CLINICAL DATA:  Altered mental status.  Fatigue.  History of stroke.  EXAM: CT HEAD WITHOUT CONTRAST  CT CERVICAL  SPINE WITHOUT CONTRAST  TECHNIQUE: Multidetector CT imaging of the head and cervical spine was performed following the standard protocol without intravenous contrast. Multiplanar CT image reconstructions of the cervical spine were also generated.  COMPARISON:  03/31/2013  FINDINGS: CT HEAD FINDINGS  The brainstem, cerebellum, cerebral peduncles, thalamus, basal ganglia, basilar cisterns, and ventricular system appear within normal limits. No intracranial hemorrhage, mass lesion, or acute CVA.  CT CERVICAL SPINE FINDINGS  No cervical spine fracture or significant abnormal subluxation. No prevertebral soft tissue swelling or significant bony lesion observed. Mild degenerative distal clavicular findings along the sternoclavicular joint noted.  IMPRESSION: 1. No significant abnormalities involving the brain or cervical spine.   Electronically Signed   By: Herbie Baltimore M.D.   On: 04/10/2013 21:50    EKG Interpretation    Date/Time:  Wednesday April 10 2013 21:51:33 EST Ventricular Rate:  99 PR Interval:  175 QRS Duration: 88 QT Interval:  377 QTC Calculation: 484 R Axis:   30 Text Interpretation:  Sinus rhythm Borderline prolonged QT interval No significant change was found Confirmed by HORTON  MD, COURTNEY ([id number]) on 04/10/2013 9:58:19 PM            MDM   1. Hepatic encephalopathy   2. Falls, initial encounter   3. Pancytopenia    Patient appears partially oriented and confused.  Denies any SI or alcohol use. Reports falling but unable to give details. Patient has multiple abrasions on upper and lower extremities of right side consistent with falling. Doubt CVA, intracranial hemorrhage, or lesions. No focal neurological deficits, CT head/neck negative. Serum ammonia elevated, pancytopenic, and elevated AST likely due to cirrhosis. Levels consistent with previous labs x 1 week ago when patient admitted for hepatic encephalopathy. Patient was admitted to [facility] for  hepatic encephalopathy.     Rudene Anda, PA-C 04/11/13 1538

## 2013-04-10 NOTE — ED Notes (Signed)
Per EMS, pt is from home.  C/O generalized weakness/confusion. No infection symptoms noted.   Family requesting psych eval for statements of SI at home.  Denies in route to hospital.  Family denies any concrete plans.  Pt HX depression, bipolar.  Vitals 150/95, hr 100, 100% ra  cbg 156.

## 2013-04-10 NOTE — ED Notes (Signed)
Bed: WA16 Expected date:  Expected time:  Means of arrival:  Comments: EMS 

## 2013-04-10 NOTE — ED Notes (Signed)
Patient transported to X-ray 

## 2013-04-11 ENCOUNTER — Encounter (HOSPITAL_COMMUNITY): Payer: Self-pay | Admitting: Internal Medicine

## 2013-04-11 DIAGNOSIS — R296 Repeated falls: Secondary | ICD-10-CM

## 2013-04-11 DIAGNOSIS — K7290 Hepatic failure, unspecified without coma: Principal | ICD-10-CM

## 2013-04-11 DIAGNOSIS — D61818 Other pancytopenia: Secondary | ICD-10-CM

## 2013-04-11 DIAGNOSIS — W19XXXA Unspecified fall, initial encounter: Secondary | ICD-10-CM

## 2013-04-11 LAB — HEPATIC FUNCTION PANEL
AST: 48 U/L — ABNORMAL HIGH (ref 0–37)
Albumin: 2.6 g/dL — ABNORMAL LOW (ref 3.5–5.2)
Bilirubin, Direct: 1.1 mg/dL — ABNORMAL HIGH (ref 0.0–0.3)
Indirect Bilirubin: 1.3 mg/dL — ABNORMAL HIGH (ref 0.3–0.9)
Total Bilirubin: 2.4 mg/dL — ABNORMAL HIGH (ref 0.3–1.2)

## 2013-04-11 LAB — CBC
HCT: 29.6 % — ABNORMAL LOW (ref 39.0–52.0)
MCH: 30.7 pg (ref 26.0–34.0)
MCHC: 34.1 g/dL (ref 30.0–36.0)
Platelets: 61 10*3/uL — ABNORMAL LOW (ref 150–400)
RBC: 3.29 MIL/uL — ABNORMAL LOW (ref 4.22–5.81)
WBC: 3.3 10*3/uL — ABNORMAL LOW (ref 4.0–10.5)

## 2013-04-11 LAB — BASIC METABOLIC PANEL
CO2: 23 mEq/L (ref 19–32)
Calcium: 9.2 mg/dL (ref 8.4–10.5)
GFR calc Af Amer: >90 mL/min (ref >90)
GFR calc non Af Amer: >90 mL/min (ref >90)
Glucose, Bld: 148 mg/dL — ABNORMAL HIGH (ref 70–99)
Potassium: 3.7 mEq/L (ref 3.5–5.1)
Sodium: 137 mEq/L (ref 135–145)

## 2013-04-11 LAB — URINALYSIS, ROUTINE W REFLEX MICROSCOPIC
Bilirubin Urine: NEGATIVE
Hgb urine dipstick: NEGATIVE
Ketones, ur: NEGATIVE mg/dL
Nitrite: NEGATIVE
Protein, ur: NEGATIVE mg/dL
Specific Gravity, Urine: 1.018 (ref 1.005–1.030)
Urobilinogen, UA: 2 mg/dL — ABNORMAL HIGH (ref 0.0–1.0)

## 2013-04-11 LAB — GLUCOSE, CAPILLARY
Glucose-Capillary: 122 mg/dL — ABNORMAL HIGH (ref 70–99)
Glucose-Capillary: 211 mg/dL — ABNORMAL HIGH (ref 70–99)

## 2013-04-11 MED ORDER — INSULIN GLARGINE 100 UNIT/ML ~~LOC~~ SOLN
35.0000 [IU] | Freq: Every day | SUBCUTANEOUS | Status: DC
  Administered 2013-04-11: 35 [IU] via SUBCUTANEOUS
  Filled 2013-04-11 (×2): qty 0.35

## 2013-04-11 MED ORDER — AMITRIPTYLINE HCL 25 MG PO TABS
25.0000 mg | ORAL_TABLET | Freq: Every day | ORAL | Status: DC
  Administered 2013-04-11: 25 mg via ORAL
  Filled 2013-04-11 (×2): qty 1

## 2013-04-11 MED ORDER — ONDANSETRON HCL 4 MG/2ML IJ SOLN: 4.0000 mg | Freq: Three times a day (TID) | INTRAMUSCULAR | Status: AC | PRN

## 2013-04-11 MED ORDER — ALPRAZOLAM 1 MG PO TABS: 1.0000 mg | ORAL_TABLET | Freq: Four times a day (QID) | ORAL | Status: DC | PRN

## 2013-04-11 MED ORDER — LISINOPRIL 40 MG PO TABS
40.0000 mg | ORAL_TABLET | Freq: Every day | ORAL | Status: DC
  Administered 2013-04-11 – 2013-04-12 (×2): 40 mg via ORAL
  Filled 2013-04-11 (×2): qty 1

## 2013-04-11 MED ORDER — ACETAMINOPHEN 650 MG RE SUPP: 650.0000 mg | Freq: Four times a day (QID) | RECTAL | Status: DC | PRN

## 2013-04-11 MED ORDER — AMITRIPTYLINE HCL 50 MG PO TABS
50.0000 mg | ORAL_TABLET | Freq: Every day | ORAL | Status: DC
  Administered 2013-04-11: 50 mg via ORAL
  Filled 2013-04-11 (×2): qty 1

## 2013-04-11 MED ORDER — LACTULOSE ENEMA
300.0000 mL | Freq: Three times a day (TID) | ORAL | Status: DC | PRN
  Filled 2013-04-11: qty 300

## 2013-04-11 MED ORDER — ESCITALOPRAM OXALATE 10 MG PO TABS
10.0000 mg | ORAL_TABLET | Freq: Every day | ORAL | Status: DC
  Administered 2013-04-11 – 2013-04-12 (×2): 10 mg via ORAL
  Filled 2013-04-11 (×2): qty 1

## 2013-04-11 MED ORDER — HYDROCODONE-ACETAMINOPHEN 5-325 MG PO TABS: 1.0000 | ORAL_TABLET | Freq: Two times a day (BID) | ORAL | Status: DC | PRN

## 2013-04-11 MED ORDER — FOLIC ACID 1 MG PO TABS
1.0000 mg | ORAL_TABLET | Freq: Every day | ORAL | Status: DC
  Administered 2013-04-11 – 2013-04-12 (×2): 1 mg via ORAL
  Filled 2013-04-11 (×2): qty 1

## 2013-04-11 MED ORDER — THIAMINE HCL 100 MG/ML IJ SOLN
100.0000 mg | Freq: Every day | INTRAMUSCULAR | Status: DC
  Filled 2013-04-11 (×2): qty 1

## 2013-04-11 MED ORDER — LORAZEPAM 2 MG/ML IJ SOLN: 1.0000 mg | Freq: Four times a day (QID) | INTRAMUSCULAR | Status: DC | PRN

## 2013-04-11 MED ORDER — SPIRONOLACTONE 100 MG PO TABS
100.0000 mg | ORAL_TABLET | Freq: Every day | ORAL | Status: DC
  Administered 2013-04-11 – 2013-04-12 (×2): 100 mg via ORAL
  Filled 2013-04-11 (×2): qty 1

## 2013-04-11 MED ORDER — FUROSEMIDE 40 MG PO TABS
40.0000 mg | ORAL_TABLET | Freq: Every day | ORAL | Status: DC
  Administered 2013-04-11 – 2013-04-12 (×2): 40 mg via ORAL
  Filled 2013-04-11 (×2): qty 1

## 2013-04-11 MED ORDER — PROPRANOLOL HCL 10 MG PO TABS
10.0000 mg | ORAL_TABLET | Freq: Two times a day (BID) | ORAL | Status: DC
  Administered 2013-04-11 – 2013-04-12 (×4): 10 mg via ORAL
  Filled 2013-04-11 (×5): qty 1

## 2013-04-11 MED ORDER — ACETAMINOPHEN 325 MG PO TABS: 650.0000 mg | ORAL_TABLET | Freq: Four times a day (QID) | ORAL | Status: DC | PRN

## 2013-04-11 MED ORDER — VITAMIN B-1 100 MG PO TABS
100.0000 mg | ORAL_TABLET | Freq: Every day | ORAL | Status: DC
  Administered 2013-04-11 – 2013-04-12 (×2): 100 mg via ORAL
  Filled 2013-04-11 (×2): qty 1

## 2013-04-11 MED ORDER — ONDANSETRON HCL 4 MG/2ML IJ SOLN: 4.0000 mg | Freq: Four times a day (QID) | INTRAMUSCULAR | Status: DC | PRN

## 2013-04-11 MED ORDER — OFLOXACIN 0.3 % OP SOLN
1.0000 [drp] | Freq: Four times a day (QID) | OPHTHALMIC | Status: DC
  Administered 2013-04-11 – 2013-04-12 (×5): 1 [drp] via OPHTHALMIC
  Filled 2013-04-11: qty 5

## 2013-04-11 MED ORDER — PREDNISOLONE ACETATE 1 % OP SUSP
1.0000 [drp] | Freq: Three times a day (TID) | OPHTHALMIC | Status: DC
  Administered 2013-04-11 – 2013-04-12 (×5): 1 [drp] via OPHTHALMIC
  Filled 2013-04-11: qty 1

## 2013-04-11 MED ORDER — DICLOFENAC SODIUM 1 % TD GEL: 1.0000 "application " | Freq: Every day | TRANSDERMAL | Status: DC | PRN

## 2013-04-11 MED ORDER — LORAZEPAM 1 MG PO TABS
1.0000 mg | ORAL_TABLET | Freq: Four times a day (QID) | ORAL | Status: DC | PRN
  Administered 2013-04-11 – 2013-04-12 (×2): 1 mg via ORAL
  Filled 2013-04-11 (×2): qty 1

## 2013-04-11 MED ORDER — INSULIN ASPART 100 UNIT/ML ~~LOC~~ SOLN
0.0000 [IU] | Freq: Three times a day (TID) | SUBCUTANEOUS | Status: DC
  Administered 2013-04-11 (×2): 3 [IU] via SUBCUTANEOUS
  Administered 2013-04-11 – 2013-04-12 (×2): 1 [IU] via SUBCUTANEOUS
  Administered 2013-04-12: 4 [IU] via SUBCUTANEOUS

## 2013-04-11 MED ORDER — PANTOPRAZOLE SODIUM 40 MG PO TBEC
40.0000 mg | DELAYED_RELEASE_TABLET | Freq: Two times a day (BID) | ORAL | Status: DC
  Administered 2013-04-11 – 2013-04-12 (×4): 40 mg via ORAL
  Filled 2013-04-11 (×6): qty 1

## 2013-04-11 MED ORDER — ADULT MULTIVITAMIN W/MINERALS CH
1.0000 | ORAL_TABLET | Freq: Every day | ORAL | Status: DC
  Administered 2013-04-11 – 2013-04-12 (×2): 1 via ORAL
  Filled 2013-04-11 (×2): qty 1

## 2013-04-11 MED ORDER — INSULIN GLARGINE 100 UNIT/ML ~~LOC~~ SOLN
46.0000 [IU] | Freq: Every day | SUBCUTANEOUS | Status: DC
  Administered 2013-04-11: 46 [IU] via SUBCUTANEOUS
  Filled 2013-04-11 (×2): qty 0.46

## 2013-04-11 MED ORDER — LACTULOSE 10 GM/15ML PO SOLN
30.0000 g | Freq: Three times a day (TID) | ORAL | Status: DC
  Administered 2013-04-11 (×3): 30 g via ORAL
  Filled 2013-04-11 (×6): qty 45

## 2013-04-11 MED ORDER — SODIUM CHLORIDE 0.9 % IJ SOLN
3.0000 mL | Freq: Two times a day (BID) | INTRAMUSCULAR | Status: DC
  Administered 2013-04-11 – 2013-04-12 (×4): 3 mL via INTRAVENOUS

## 2013-04-11 MED ORDER — ONDANSETRON HCL 4 MG PO TABS: 4.0000 mg | ORAL_TABLET | Freq: Four times a day (QID) | ORAL | Status: DC | PRN

## 2013-04-11 MED ORDER — ADULT MULTIVITAMIN W/MINERALS CH: 1.0000 | ORAL_TABLET | Freq: Every day | ORAL | Status: DC

## 2013-04-11 NOTE — H&P (Signed)
[facility] History and Physical  NAZAR KUAN ZOX:[id number] DOB: 08/28/1955 DOA: 04/10/2013  Referring physician: ER physician. PCP: Lonia Blood, MD   Chief Complaint: Lethargy and falls.  HPI: [name] is a 57 y.o. male was brought from his patient was found to be lethargic and having frequent falls. Patient states that over last couple of days she's been following and patient also was found to be increasingly lethargic. Patient stated his been taking his medications as advised. In addition patient is also found to be depressed and I was initially some question about patient being suicidal but patient denies any suicidal or homicidal ideas. Patient is very tearful on exam. Denies any chest pain shortness of breath abdominal pain. Denies any nausea vomiting diarrhea. He states that he tries to walk he falls but denies any palpitations focal deficits. In the ER CT head did not show any acute. Ammonia levels were mildly elevated. Denies having drinking any alcohol recently. Patient was admitted last week for repair encephalopathy which improved with lactulose. Patient was admitted last month for GI bleed and EGD showed only flat varices with no active bleed.   Review of Systems: As presented in the history of presenting illness, rest negative.  Past Medical History  Diagnosis Date  . Neuropathy   . Diabetes mellitus   . Bipolar affect, depressed   . Hypertension   . Arthritis   . Stroke     Mini stroke about 27yrs ago  . Cirrhosis   . Alcohol abuse   . Chronic pain   . Cocaine abuse   . Muscle spasm     both legs   Past Surgical History  Procedure Laterality Date  . Fracture surgery      Leg and arm 52yrs ago  . Esophagogastroduodenoscopy  04/04/2012    Procedure: ESOPHAGOGASTRODUODENOSCOPY (EGD);  Surgeon: Hilarie Fredrickson, MD;  Location: [facility] ENDOSCOPY;  Service: Endoscopy;  Laterality: N/A;  . Eye surgery  8 months ago both eyes  . Esophagogastroduodenoscopy Left  03/13/2013    Procedure: ESOPHAGOGASTRODUODENOSCOPY (EGD);  Surgeon: Willis Modena, MD;  Location: [facility] ENDOSCOPY;  Service: Endoscopy;  Laterality: Left;   Social History:  reports that he has been smoking Cigarettes.  He has a 30 pack-year smoking history. He has never used smokeless tobacco. He reports that he drinks alcohol. He reports that he uses illicit drugs (Cocaine). Where does patient live home. Can patient participate in ADLs? Not sure.  No Known Allergies  Family History:  Family History  Problem Relation Age of Onset  . Hypotension Mother       Prior to Admission medications   Medication Sig Start Date End Date Taking? Authorizing Provider  ALPRAZolam Prudy Feeler) 1 MG tablet Take 1 mg by mouth every 6 (six) hours as needed for anxiety.  03/29/13  Yes Historical Provider, MD  amitriptyline (ELAVIL) 50 MG tablet Take 50 mg by mouth at bedtime.  03/10/13  Yes Historical Provider, MD  diclofenac sodium (VOLTAREN) 1 % GEL Apply 1 application topically daily as needed (back and leg pain).   Yes Historical Provider, MD  furosemide (LASIX) 40 MG tablet Take 1 tablet (40 mg total) by mouth daily. 04/02/13  Yes Nishant Dhungel, MD  HYDROcodone-acetaminophen (NORCO/VICODIN) 5-325 MG per tablet Take 1 tablet by mouth 2 (two) times daily as needed (pain).    Yes Historical Provider, MD  Insulin Glargine (LANTUS SOLOSTAR) 100 UNIT/ML SOPN Inject 46 Units into the skin at bedtime.   Yes Historical Provider, MD  lactulose (CHRONULAC) 10 GM/15ML solution Take 30 mLs (20 g total) by mouth 3 (three) times daily. 04/02/13  Yes Nishant Dhungel, MD  lisinopril (PRINIVIL,ZESTRIL) 40 MG tablet Take 1 tablet (40 mg total) by mouth daily. For high blood pressure management 04/02/13  Yes Nishant Dhungel, MD  Multiple Vitamin (MULTIVITAMIN WITH MINERALS) TABS tablet Take 1 tablet by mouth daily. (Resume with the vitamins that you have at home): For low vitamin 02/06/13  Yes Sanjuana Kava, NP  ofloxacin  (OCUFLOX) 0.3 % ophthalmic solution Place 1 drop into both eyes 4 (four) times daily.  04/05/13  Yes Historical Provider, MD  pantoprazole (PROTONIX) 40 MG tablet Take 1 tablet (40 mg total) by mouth 2 (two) times daily. 03/14/13  Yes Osvaldo Shipper, MD  prednisoLONE acetate (PRED FORTE) 1 % ophthalmic suspension Place 1 drop into both eyes 3 (three) times daily.  04/05/13  Yes Historical Provider, MD  propranolol (INDERAL) 10 MG tablet Take 1 tablet (10 mg total) by mouth 2 (two) times daily. 04/02/13  Yes Nishant Dhungel, MD  spironolactone (ALDACTONE) 100 MG tablet Take 1 tablet (100 mg total) by mouth daily. 04/02/13  Yes Eddie North, MD    Physical Exam: Filed Vitals:   04/10/13 2146 04/10/13 2308 04/10/13 2330 04/11/13 0011  BP:   153/68 158/82  Pulse:    95  Temp: 98.7 F (37.1 C) 97.7 F (36.5 C)  97.9 F (36.6 C)  TempSrc: Rectal   Oral  Resp:   12 17  SpO2:   96% 97%     General:  Well-developed and nourished.  Eyes: Anicteric no pallor.  ENT: No discharge from ears eyes nose mouth.  Neck: No mass felt.  Cardiovascular: S1-S2 heard.  Respiratory: No rhonchi or crepitations.  Abdomen: Soft non-tender bowel sounds present.  Skin: Has multiple bruises on the extremities.  Musculoskeletal: Edema present with chronic skin changes.  Psychiatric: Appears depressed but denies any suicidal ideations.  Neurologic: Alert awake oriented to time place and person. With periods of confusion. Moves all extremities.  Labs on Admission:  Basic Metabolic Panel:  Recent Labs Lab 04/10/13 2045  NA 137  K 4.2  CL 105  CO2 23  GLUCOSE 174*  BUN 11  CREATININE 0.83  CALCIUM 9.4   Liver Function Tests:  Recent Labs Lab 04/10/13 2045  AST 50*  ALT 20  ALKPHOS 95  BILITOT 2.5*  PROT 6.8  ALBUMIN 2.7*   No results found for this basename: LIPASE, AMYLASE,  in the last 168 hours  Recent Labs Lab 04/10/13 2100  AMMONIA 93*   CBC:  Recent Labs Lab  04/10/13 2045  WBC 3.2*  NEUTROABS 2.0  HGB 10.9*  HCT 31.8*  MCV 89.8  PLT 62*   Cardiac Enzymes: No results found for this basename: CKTOTAL, CKMB, CKMBINDEX, TROPONINI,  in the last 168 hours  BNP (last 3 results) No results found for this basename: PROBNP,  in the last 8760 hours CBG: No results found for this basename: GLUCAP,  in the last 168 hours  Radiological Exams on Admission: Dg Chest 2 View  04/10/2013   CLINICAL DATA:  Weakness  EXAM: CHEST  2 VIEW  COMPARISON:  March 31, 2013  FINDINGS: Lungs are clear. Heart is upper normal in size with normal pulmonary vascularity. No adenopathy. No bone lesions.  IMPRESSION: No edema or consolidation.   Electronically Signed   By: Bretta Bang M.D.   On: 04/10/2013 21:19   Ct Head Wo Contrast  04/10/2013   CLINICAL DATA:  Altered mental status.  Fatigue.  History of stroke.  EXAM: CT HEAD WITHOUT CONTRAST  CT CERVICAL SPINE WITHOUT CONTRAST  TECHNIQUE: Multidetector CT imaging of the head and cervical spine was performed following the standard protocol without intravenous contrast. Multiplanar CT image reconstructions of the cervical spine were also generated.  COMPARISON:  03/31/2013  FINDINGS: CT HEAD FINDINGS  The brainstem, cerebellum, cerebral peduncles, thalamus, basal ganglia, basilar cisterns, and ventricular system appear within normal limits. No intracranial hemorrhage, mass lesion, or acute CVA.  CT CERVICAL SPINE FINDINGS  No cervical spine fracture or significant abnormal subluxation. No prevertebral soft tissue swelling or significant bony lesion observed. Mild degenerative distal clavicular findings along the sternoclavicular joint noted.  IMPRESSION: 1. No significant abnormalities involving the brain or cervical spine.   Electronically Signed   By: Herbie Baltimore M.D.   On: 04/10/2013 21:50   Ct Cervical Spine Wo Contrast  04/10/2013   CLINICAL DATA:  Altered mental status.  Fatigue.  History of stroke.  EXAM:  CT HEAD WITHOUT CONTRAST  CT CERVICAL SPINE WITHOUT CONTRAST  TECHNIQUE: Multidetector CT imaging of the head and cervical spine was performed following the standard protocol without intravenous contrast. Multiplanar CT image reconstructions of the cervical spine were also generated.  COMPARISON:  03/31/2013  FINDINGS: CT HEAD FINDINGS  The brainstem, cerebellum, cerebral peduncles, thalamus, basal ganglia, basilar cisterns, and ventricular system appear within normal limits. No intracranial hemorrhage, mass lesion, or acute CVA.  CT CERVICAL SPINE FINDINGS  No cervical spine fracture or significant abnormal subluxation. No prevertebral soft tissue swelling or significant bony lesion observed. Mild degenerative distal clavicular findings along the sternoclavicular joint noted.  IMPRESSION: 1. No significant abnormalities involving the brain or cervical spine.   Electronically Signed   By: Herbie Baltimore M.D.   On: 04/10/2013 21:50    EKG: Independently reviewed. Normal sinus rhythm with nonspecific ST-T changes.  Assessment/Plan Principal Problem:   Hepatic encephalopathy Active Problems:   Diabetes mellitus   Cirrhosis   Pancytopenia   Falls   1. Hepatic encephalopathy - not sure if patient was taking his medications. At this time I have written for lactulose 30 cc by mouth 3 times a day. Closely follow ammonia levels and LFTs. Patient's abdomen is nontender and does not have any signs or symptoms to suggest SBP. 2. History of cirrhosis of liver - continue with diuretics and closely follow intake output and metabolic panel. See #1. 3. Depression - consult psychiatry in a.m. Patient is not suicidal at this time. 4. Diabetes mellitus type 2 - continue home medications with close followup of CBGs with sliding-scale. 5. Hypertension - continue home medications. 6. Pancytopenia - probably related to cirrhosis of liver and also alcoholism but patient denies having any alcohol recently. 7. History  of alcoholism - patient denies any alcohol recently. Patient is on when necessary Ativan and thiamine. 8. Frequent falls - physical therapy has been consulted. Check orthostatics.  Social work consult for home situation.  Code Status: Full code.  Family Communication: None.  Disposition Plan: Admit to inpatient.    Reyaansh Merlo N. [facility] Pager [phone].  If 7PM-7AM, please contact night-coverage [email or web address] Password [email or web address] 04/11/2013, 1:19 AM

## 2013-04-11 NOTE — Evaluation (Signed)
Physical Therapy Evaluation Patient Details Name: [name] MRN: [id number] DOB: 05-01-1956 Today's Date: 04/11/2013 Time: 7829-5621 PT Time Calculation (min): 9 min  PT Assessment / Plan / Recommendation History of Present Illness  57 yo male admitted with hepatic encephalopathy, lethargy, falls. Hx of neuropathy, bipolar d/o, CVA, chronic pain, cirrhosis  Clinical Impression  On eval, pt required Min assist for mobility-able to ambulate ~150 feet with walker. Demonstrates general weakness and impaired static and dynamic standing balance. Pt is at risk for falls. Pt is still somewhat confused,disoriented. Recommend HHPT with 24 hour supervision at this time. May need to consider SNF if pt will not have supervision.     PT Assessment  Patient needs continued PT services    Follow Up Recommendations  Home health PT;Supervision/Assistance - 24 hour (depending on progress. May need to consider SNF if pt will not have supervision)    Does the patient have the potential to tolerate intense rehabilitation      Barriers to Discharge        Equipment Recommendations  None recommended by PT    Recommendations for Other Services OT consult   Frequency Min 3X/week    Precautions / Restrictions Precautions Precautions: Fall Restrictions Weight Bearing Restrictions: No   Pertinent Vitals/Pain No c/o pain      Mobility  Bed Mobility Bed Mobility: Supine to Sit;Sit to Supine Supine to Sit: 4: Min assist Sit to Supine: 4: Min assist Details for Bed Mobility Assistance: Assist for trunk to upright and assist for safety when getting back into bed Transfers Transfers: Sit to Stand;Stand to Sit Sit to Stand: 4: Min assist;From elevated surface;From bed Stand to Sit: 4: Min assist;To bed Details for Transfer Assistance: Assist to rise, stabilize, control descent. Multimodal cues for safety, technique, hand placement Ambulation/Gait Ambulation/Gait Assistance: 4: Min  assist Ambulation Distance (Feet): 150 Feet Assistive device: Rolling walker Ambulation/Gait Assistance Details: Assist to stabilize throughout ambulation and to maneuver with RW. Unsteady.  Gait Pattern: Step-through pattern    Exercises     PT Diagnosis: Difficulty walking;Abnormality of gait;Generalized weakness  PT Problem List: Decreased balance;Decreased mobility;Decreased safety awareness;Decreased knowledge of use of DME PT Treatment Interventions: DME instruction;Gait training;Functional mobility training;Therapeutic activities;Therapeutic exercise;Patient/family education;Balance training     PT Goals(Current goals can be found in the care plan section) Acute Rehab PT Goals Patient Stated Goal: home PT Goal Formulation: With patient Time For Goal Achievement: 04/18/13 Potential to Achieve Goals: Good  Visit Information  Last PT Received On: 04/11/13 Assistance Needed: +1 History of Present Illness: 57 yo male admitted with hepatic encephalopathy, lethargy, falls. Hx of neuropathy, bipolar d/o, CVA, chronic pain, cirrhosis       Prior Functioning  Home Living Family/patient expects to be discharged to:: Private residence Living Arrangements: Spouse/significant other Type of Home: House Home Access: Stairs to enter Secretary/administrator of Steps: 3 Entrance Stairs-Rails: Right Home Equipment: Environmental consultant - 2 wheels Prior Function Level of Independence: Independent with assistive device(s) Communication Communication: No difficulties    Cognition  Cognition Arousal/Alertness: Awake/alert Behavior During Therapy: WFL for tasks assessed/performed Overall Cognitive Status: Impaired/Different from baseline Area of Impairment: Orientation;Safety/judgement Orientation Level: Disoriented to;Place;Time Safety/Judgement: Decreased awareness of safety    Extremity/Trunk Assessment Upper Extremity Assessment Upper Extremity Assessment: Overall WFL for tasks assessed Lower  Extremity Assessment Lower Extremity Assessment: Generalized weakness Cervical / Trunk Assessment Cervical / Trunk Assessment: Normal   Balance Balance Balance Assessed: Yes Static Standing Balance Static Standing - Balance Support: Bilateral  upper extremity supported Static Standing - Level of Assistance: 5: Stand by assistance Dynamic Standing Balance Dynamic Standing - Balance Support: Bilateral upper extremity supported Dynamic Standing - Level of Assistance: 4: Min assist  End of Session PT - End of Session Activity Tolerance: Patient tolerated treatment well Patient left: in bed;with call bell/phone within reach;with bed alarm set;with nursing/sitter in room Nurse Communication: Mobility status  GP     Rebeca Alert, MPT Pager: [phone]

## 2013-04-11 NOTE — Progress Notes (Signed)
Clinical Social Work  CSW attempted to meet with patient to complete psych assessment. Patient drowsy and unable to participate in assessment. CSW to follow up at later time.  [place], [place] [phone]

## 2013-04-11 NOTE — Progress Notes (Signed)
[facility] PROGRESS NOTE  [name] XBJ:[id number] DOB: 10-05-1955 DOA: 04/10/2013 PCP: Lonia Blood, MD  Assessment/Plan  Hepatic encephalopathy -  Continue lactulose TID for now and titrate so that he has 3-5 BMs per day -  q4h neurochecks -  Will discharge once mentation improves.    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  If FS elevated in AM, can give additional morning lantus -  Continue SSI  HTN, blood pressure elevated  Pancytopenia likely secondary to EtOH suppression and cirrhosis -  Blood counts stable from yesterday to this morning  EtOH without recent use.  Takes ativan prn.  Continue thiamine  Frequent falls, PT recommending home health PT/OT and possibly SNF if he does not have 24 hour supervision  Diet:  Low dosium Access:   PIV IVF: off Proph:  SCD  Code Status: full Family Communication: patient alone Disposition Plan: pending improvement in mentation   Consultants:  None  Procedures:  None3  Antibiotics:  Ofloxacin chronically   HPI/Subjective:  Tearful.  States that he feels mostly well.      Objective: Filed Vitals:   04/11/13 1200 04/11/13 1407 04/11/13 1500 04/11/13 1600  BP: 162/90 147/79  149/81  Pulse: 86 80  77  Temp:  98 F (36.7 C)    TempSrc:  Oral    Resp:  18    Height:   5\' 10"  (1.778 m)   SpO2:  96%      Intake/Output Summary (Last 24 hours) at 04/11/13 1722 Last data filed at 04/11/13 1414  Gross per 24 hour  Intake    483 ml  Output   1000 ml  Net   -517 ml   There were no vitals filed for this visit.  Exam:   General:  CF, No acute distress  HEENT:  NCAT, MMM  Cardiovascular:  RRR, nl S1, S2 no mrg, 2+ pulses, warm  extremities  Respiratory:  CTAB, no increased WOB  Abdomen:   NABS, soft, NT/ND  MSK:   Normal tone and bulk, no LEE  Neuro:  Grossly intact  Psych:  Slow to answer questions and confused during exam.  Very tearful  Data Reviewed: Basic Metabolic Panel:  Recent Labs Lab 04/10/13 2045 04/11/13 0505  NA 137 137  K 4.2 3.7  CL 105 104  CO2 23 23  GLUCOSE 174* 148*  BUN 11 11  CREATININE 0.83 0.83  CALCIUM 9.4 9.2   Liver Function Tests:  Recent Labs Lab 04/10/13 2045 04/11/13 0505  AST 50* 48*  ALT 20 19  ALKPHOS 95 95  BILITOT 2.5* 2.4*  PROT 6.8 6.5  ALBUMIN 2.7* 2.6*   No results found for this basename: LIPASE, AMYLASE,  in the last 168 hours  Recent Labs Lab 04/10/13 2100  AMMONIA 93*   CBC:  Recent Labs Lab 04/10/13 2045 04/11/13 0505  WBC 3.2* 3.3*  NEUTROABS 2.0  --   HGB 10.9* 10.1*  HCT 31.8* 29.6*  MCV 89.8 90.0  PLT 62* 61*   Cardiac Enzymes: No results found for this basename: CKTOTAL, CKMB, CKMBINDEX, TROPONINI,  in the last 168 hours BNP (last 3 results) No results found for this basename: PROBNP,  in the last 8760 hours CBG:  Recent Labs Lab 04/11/13 0740 04/11/13 1143  04/11/13 1657  GLUCAP 122* 205* 211*    No results found for this or any previous visit (from the past 240 hour(s)).   Studies: Dg Chest 2 View  04/10/2013   CLINICAL DATA:  Weakness  EXAM: CHEST  2 VIEW  COMPARISON:  March 31, 2013  FINDINGS: Lungs are clear. Heart is upper normal in size with normal pulmonary vascularity. No adenopathy. No bone lesions.  IMPRESSION: No edema or consolidation.   Electronically Signed   By: Bretta Bang M.D.   On: 04/10/2013 21:19   Ct Head Wo Contrast  04/10/2013   CLINICAL DATA:  Altered mental status.  Fatigue.  History of stroke.  EXAM: CT HEAD WITHOUT CONTRAST  CT CERVICAL SPINE WITHOUT CONTRAST  TECHNIQUE: Multidetector CT imaging of the head and cervical spine was performed following the standard protocol  without intravenous contrast. Multiplanar CT image reconstructions of the cervical spine were also generated.  COMPARISON:  03/31/2013  FINDINGS: CT HEAD FINDINGS  The brainstem, cerebellum, cerebral peduncles, thalamus, basal ganglia, basilar cisterns, and ventricular system appear within normal limits. No intracranial hemorrhage, mass lesion, or acute CVA.  CT CERVICAL SPINE FINDINGS  No cervical spine fracture or significant abnormal subluxation. No prevertebral soft tissue swelling or significant bony lesion observed. Mild degenerative distal clavicular findings along the sternoclavicular joint noted.  IMPRESSION: 1. No significant abnormalities involving the brain or cervical spine.   Electronically Signed   By: Herbie Baltimore M.D.   On: 04/10/2013 21:50   Ct Cervical Spine Wo Contrast  04/10/2013   CLINICAL DATA:  Altered mental status.  Fatigue.  History of stroke.  EXAM: CT HEAD WITHOUT CONTRAST  CT CERVICAL SPINE WITHOUT CONTRAST  TECHNIQUE: Multidetector CT imaging of the head and cervical spine was performed following the standard protocol without intravenous contrast. Multiplanar CT image reconstructions of the cervical spine were also generated.  COMPARISON:  03/31/2013  FINDINGS: CT HEAD FINDINGS  The brainstem, cerebellum, cerebral peduncles, thalamus, basal ganglia, basilar cisterns, and ventricular system appear within normal limits. No intracranial hemorrhage, mass lesion, or acute CVA.  CT CERVICAL SPINE FINDINGS  No cervical spine fracture or significant abnormal subluxation. No prevertebral soft tissue swelling or significant bony lesion observed. Mild degenerative distal clavicular findings along the sternoclavicular joint noted.  IMPRESSION: 1. No significant abnormalities involving the brain or cervical spine.   Electronically Signed   By: Herbie Baltimore M.D.   On: 04/10/2013 21:50    Scheduled Meds: . amitriptyline  25 mg Oral QHS  . escitalopram  10 mg Oral Daily  . folic acid   1 mg Oral Daily  . furosemide  40 mg Oral Daily  . insulin aspart  0-9 Units Subcutaneous TID WC  . insulin glargine  46 Units Subcutaneous QHS  . lactulose  30 g Oral TID  . lisinopril  40 mg Oral Daily  . multivitamin with minerals  1 tablet Oral Daily  . ofloxacin  1 drop Both Eyes QID  . pantoprazole  40 mg Oral BID  . prednisoLONE acetate  1 drop Both Eyes TID  . propranolol  10 mg Oral BID  . sodium chloride  3 mL Intravenous Q12H  . spironolactone  100 mg Oral Daily  . thiamine  100 mg Oral Daily   Or  . thiamine  100 mg Intravenous Daily   Continuous Infusions:   Principal Problem:   Hepatic encephalopathy Active Problems:   Diabetes mellitus   Cirrhosis   Pancytopenia   Falls  Time spent: 30 min    Kaito Schulenburg, [facility]  [facility] Pager [phone]. If 7PM-7AM, please contact night-coverage at [email or web address], password [facility] 04/11/2013, 5:22 PM  LOS: 1 day

## 2013-04-11 NOTE — Consult Note (Signed)
[facility] Face-to-Face Psychiatry Consult   Reason for Consult:  Depression Referring Physician:  Dr Teodoro Baker is an 57 y.o. male.  Assessment: AXIS I:  Depressive Disorder NOS AXIS II:  Deferred AXIS III:   Past Medical History  Diagnosis Date  . Neuropathy   . Diabetes mellitus   . Bipolar affect, depressed   . Hypertension   . Arthritis   . Stroke     Mini stroke about 43yrs ago  . Cirrhosis   . Alcohol abuse   . Chronic pain   . Cocaine abuse   . Muscle spasm     both legs   AXIS IV:  problems related to social environment and problems with primary support group AXIS V:  51-60 moderate symptoms  Plan:  Patient does not meet criteria for psychiatric inpatient admission. Supportive therapy provided about ongoing stressors. Discussed crisis plan, support from social network, calling 911, coming to the Emergency Department, and calling Suicide Hotline.  Subjective:   [name] is a 57 y.o. male patient admitted with repeated falls and weakness.  HPI:  Patient seen chart reviewed.  Patient's 57 year old man who is admitted because of repeated falls and feeling weak and lethargy.  Patient told that he is very concerned about his physical health.  The past few days he is not sleeping well.  He is worried about his repeated falls.  Patient is a poor historian.  He was very depressed and tearful.  He lives with fianc.  He is disappointed because she did not come to visit her today.  The patient has difficulty organizing his thoughts.   He remember being at behavioral Center 2 months ago because of alcohol intoxication and requesting adults.  He is not taking any antidepressant.  He is taking Xanax which is given by his primary care physician Darrell. [name].  The patient admitted that he is more stressed and depressed for past 6 weeks.  He endorses anhedonia, sometimes feelings of hopelessness and helplessness however he denies any suicidal thoughts or homicidal thoughts.  He  endorse increased crying with lack of motivation and nervous about his physical health.  He also reported that he is not getting enough help from her fianc.  I be her records.  Patient has been admitted multiple times because of fall.  He has a high ammonia level.  He has encephalopathy.  Patient denies any recent alcohol use.  However in the past he has history of high blood alcohol level.  Currently he is on amitriptyline.  Patient does not see any improvement with amitriptyline.  He is not sleeping all night.  Patient denies any hallucination, paranoia, suicidal thoughts or any homicidal thoughts.  He denies any aggression, violence or any nightmares.  He feels tired and weak. HPI Elements:   Location:  Medical floor. Quality:  Fair. Severity:  Mild to moderate.  Past Psychiatric History: Past Medical History  Diagnosis Date  . Neuropathy   . Diabetes mellitus   . Bipolar affect, depressed   . Hypertension   . Arthritis   . Stroke     Mini stroke about 50yrs ago  . Cirrhosis   . Alcohol abuse   . Chronic pain   . Cocaine abuse   . Muscle spasm     both legs    reports that he has been smoking Cigarettes.  He has a 30 pack-year smoking history. He has never used smokeless tobacco. He reports that he drinks alcohol. He  reports that he uses illicit drugs (Cocaine). Family History  Problem Relation Age of Onset  . Hypotension Mother      Living Arrangements: Spouse/significant other   Abuse/Neglect [facility]) Physical Abuse: Denies Verbal Abuse: Denies Sexual Abuse: Denies Allergies:  No Known Allergies  ACT Assessment Complete:  No:   Past Psychiatric History: Diagnosis:  Alcohol abuse   Hospitalizations:  He has had [facility]   Outpatient Care:  None   Substance Abuse Care:  History of alcohol abuse   Self-Mutilation:  Denies   Suicidal Attempts:  Denies   Homicidal Behaviors:  Denies    Violent Behaviors:  Denies    Place of Residence:  Lives with his  fiance Marital Status:  Divorced Employed/Unemployed:  On disability Objective: Blood pressure 147/79, pulse 80, temperature 98 F (36.7 C), temperature source Oral, resp. rate 18, height 5\' 10"  (1.778 m), SpO2 96.00%.There is no weight on file to calculate BMI. Results for orders placed during the hospital encounter of 04/10/13 (from the past 72 hour(s))  CBC WITH DIFFERENTIAL     Status: Abnormal   Collection Time    04/10/13  8:45 PM      Result Value Range   WBC 3.2 (*) 4.0 - 10.5 K/uL   RBC 3.54 (*) 4.22 - 5.81 MIL/uL   Hemoglobin 10.9 (*) 13.0 - 17.0 g/dL   HCT 40.9 (*) 81.1 - 91.4 %   MCV 89.8  78.0 - 100.0 fL   MCH 30.8  26.0 - 34.0 pg   MCHC 34.3  30.0 - 36.0 g/dL   RDW 78.2 (*) 95.6 - 21.3 %   Platelets 62 (*) 150 - 400 K/uL   Comment: SPECIMEN CHECKED FOR CLOTS     PLATELET COUNT CONFIRMED BY SMEAR   Neutrophils Relative % 59  43 - 77 %   Lymphocytes Relative 23  12 - 46 %   Monocytes Relative 10  3 - 12 %   Eosinophils Relative 7 (*) 0 - 5 %   Basophils Relative 1  0 - 1 %   Neutro Abs 2.0  1.7 - 7.7 K/uL   Lymphs Abs 0.7  0.7 - 4.0 K/uL   Monocytes Absolute 0.3  0.1 - 1.0 K/uL   Eosinophils Absolute 0.2  0.0 - 0.7 K/uL   Basophils Absolute 0.0  0.0 - 0.1 K/uL   WBC Morphology TOXIC GRANULATION    COMPREHENSIVE METABOLIC PANEL     Status: Abnormal   Collection Time    04/10/13  8:45 PM      Result Value Range   Sodium 137  135 - 145 mEq/L   Potassium 4.2  3.5 - 5.1 mEq/L   Chloride 105  96 - 112 mEq/L   CO2 23  19 - 32 mEq/L   Glucose, Bld 174 (*) 70 - 99 mg/dL   BUN 11  6 - 23 mg/dL   Creatinine, Ser 0.86  0.50 - 1.35 mg/dL   Calcium 9.4  8.4 - 57.8 mg/dL   Total Protein 6.8  6.0 - 8.3 g/dL   Albumin 2.7 (*) 3.5 - 5.2 g/dL   AST 50 (*) 0 - 37 U/L   ALT 20  0 - 53 U/L   Alkaline Phosphatase 95  39 - 117 U/L   Total Bilirubin 2.5 (*) 0.3 - 1.2 mg/dL   GFR calc non Af Amer >90  >90 mL/min   GFR calc Af Amer >90  >90 mL/min   Comment: (NOTE)  The  eGFR has been calculated using the CKD EPI equation.     This calculation has not been validated in all clinical situations.     eGFR's persistently <90 mL/min signify possible Chronic Kidney     Disease.  ETHANOL     Status: None   Collection Time    04/10/13  8:45 PM      Result Value Range   Alcohol, Ethyl (B) <11  0 - 11 mg/dL   Comment:            LOWEST DETECTABLE LIMIT FOR     SERUM ALCOHOL IS 11 mg/dL     FOR MEDICAL PURPOSES ONLY  SALICYLATE LEVEL     Status: Abnormal   Collection Time    04/10/13  8:45 PM      Result Value Range   Salicylate Lvl <2.0 (*) 2.8 - 20.0 mg/dL  ACETAMINOPHEN LEVEL     Status: None   Collection Time    04/10/13  8:45 PM      Result Value Range   Acetaminophen (Tylenol), Serum <15.0  10 - 30 ug/mL   Comment:            THERAPEUTIC CONCENTRATIONS VARY     SIGNIFICANTLY. A RANGE OF 10-30     ug/mL MAY BE AN EFFECTIVE     CONCENTRATION FOR MANY PATIENTS.     HOWEVER, SOME ARE BEST TREATED     AT CONCENTRATIONS OUTSIDE THIS     RANGE.     ACETAMINOPHEN CONCENTRATIONS     >150 ug/mL AT 4 HOURS AFTER     INGESTION AND >50 ug/mL AT 12     HOURS AFTER INGESTION ARE     OFTEN ASSOCIATED WITH TOXIC     REACTIONS.  PROTIME-INR     Status: Abnormal   Collection Time    04/10/13  8:45 PM      Result Value Range   Prothrombin Time 17.4 (*) 11.6 - 15.2 seconds   INR 1.47  0.00 - 1.49  APTT     Status: Abnormal   Collection Time    04/10/13  8:45 PM      Result Value Range   aPTT 46 (*) 24 - 37 seconds   Comment:            IF BASELINE aPTT IS ELEVATED,     SUGGEST PATIENT RISK ASSESSMENT     BE USED TO DETERMINE APPROPRIATE     ANTICOAGULANT THERAPY.  AMMONIA     Status: Abnormal   Collection Time    04/10/13  9:00 PM      Result Value Range   Ammonia 93 (*) 11 - 60 umol/L  LACTIC ACID, PLASMA     Status: None   Collection Time    04/10/13  9:43 PM      Result Value Range   Lactic Acid, Venous 2.2  0.5 - 2.2 mmol/L  OCCULT BLOOD, POC  DEVICE     Status: None   Collection Time    04/10/13 10:41 PM      Result Value Range   Fecal Occult Bld NEGATIVE  NEGATIVE  HEPATIC FUNCTION PANEL     Status: Abnormal   Collection Time    04/11/13  5:05 AM      Result Value Range   Total Protein 6.5  6.0 - 8.3 g/dL   Albumin 2.6 (*) 3.5 - 5.2 g/dL   AST 48 (*) 0 - 37 U/L  ALT 19  0 - 53 U/L   Alkaline Phosphatase 95  39 - 117 U/L   Total Bilirubin 2.4 (*) 0.3 - 1.2 mg/dL   Bilirubin, Direct 1.1 (*) 0.0 - 0.3 mg/dL   Indirect Bilirubin 1.3 (*) 0.3 - 0.9 mg/dL  TSH     Status: None   Collection Time    04/11/13  5:05 AM      Result Value Range   TSH 0.350  0.350 - 4.500 uIU/mL   Comment: Performed at [employer] [facility]  BASIC METABOLIC PANEL     Status: Abnormal   Collection Time    04/11/13  5:05 AM      Result Value Range   Sodium 137  135 - 145 mEq/L   Potassium 3.7  3.5 - 5.1 mEq/L   Chloride 104  96 - 112 mEq/L   CO2 23  19 - 32 mEq/L   Glucose, Bld 148 (*) 70 - 99 mg/dL   BUN 11  6 - 23 mg/dL   Creatinine, Ser 0.25  0.50 - 1.35 mg/dL   Calcium 9.2  8.4 - 42.7 mg/dL   GFR calc non Af Amer >90  >90 mL/min   GFR calc Af Amer >90  >90 mL/min   Comment: (NOTE)     The eGFR has been calculated using the CKD EPI equation.     This calculation has not been validated in all clinical situations.     eGFR's persistently <90 mL/min signify possible Chronic Kidney     Disease.  CBC     Status: Abnormal   Collection Time    04/11/13  5:05 AM      Result Value Range   WBC 3.3 (*) 4.0 - 10.5 K/uL   RBC 3.29 (*) 4.22 - 5.81 MIL/uL   Hemoglobin 10.1 (*) 13.0 - 17.0 g/dL   HCT 06.2 (*) 37.6 - 28.3 %   MCV 90.0  78.0 - 100.0 fL   MCH 30.7  26.0 - 34.0 pg   MCHC 34.1  30.0 - 36.0 g/dL   RDW 15.1 (*) 76.1 - 60.7 %   Platelets 61 (*) 150 - 400 K/uL   Comment: CONSISTENT WITH PREVIOUS RESULT  GLUCOSE, CAPILLARY     Status: Abnormal   Collection Time    04/11/13  7:40 AM      Result Value Range   Glucose-Capillary 122 (*)  70 - 99 mg/dL   Comment 1 Notify RN    GLUCOSE, CAPILLARY     Status: Abnormal   Collection Time    04/11/13 11:43 AM      Result Value Range   Glucose-Capillary 205 (*) 70 - 99 mg/dL   Comment 1 Notify RN    URINALYSIS, ROUTINE W REFLEX MICROSCOPIC     Status: Abnormal   Collection Time    04/11/13  2:15 PM      Result Value Range   Color, Urine YELLOW  YELLOW   APPearance CLEAR  CLEAR   Specific Gravity, Urine 1.018  1.005 - 1.030   pH 7.5  5.0 - 8.0   Glucose, UA 250 (*) NEGATIVE mg/dL   Hgb urine dipstick NEGATIVE  NEGATIVE   Bilirubin Urine NEGATIVE  NEGATIVE   Ketones, ur NEGATIVE  NEGATIVE mg/dL   Protein, ur NEGATIVE  NEGATIVE mg/dL   Urobilinogen, UA 2.0 (*) 0.0 - 1.0 mg/dL   Nitrite NEGATIVE  NEGATIVE   Leukocytes, UA NEGATIVE  NEGATIVE   Comment: MICROSCOPIC NOT DONE ON  URINES WITH NEGATIVE PROTEIN, BLOOD, LEUKOCYTES, NITRITE, OR GLUCOSE <1000 mg/dL.   Labs are reviewed and are pertinent for high AST and glucose..  Current Facility-Administered Medications  Medication Dose Route Frequency Provider Last Rate Last Dose  . acetaminophen (TYLENOL) tablet 650 mg  650 mg Oral Q6H PRN Darrell Clos, MD       Or  . acetaminophen (TYLENOL) suppository 650 mg  650 mg Rectal Q6H PRN Darrell Clos, MD      . ALPRAZolam Prudy Feeler) tablet 1 mg  1 mg Oral Q6H PRN Darrell Clos, MD      . amitriptyline (ELAVIL) tablet 25 mg  25 mg Oral QHS Darrell Fickle, MD      . diclofenac sodium (VOLTAREN) 1 % transdermal gel 1 application  1 application Topical Daily PRN Darrell Clos, MD      . folic acid (FOLVITE) tablet 1 mg  1 mg Oral Daily Darrell Clos, MD   1 mg at 04/11/13 1040  . furosemide (LASIX) tablet 40 mg  40 mg Oral Daily Darrell Clos, MD   40 mg at 04/11/13 1040  . insulin aspart (novoLOG) injection 0-9 Units  0-9 Units Subcutaneous TID WC Darrell Clos, MD   3 Units at 04/11/13 1212  . insulin glargine (LANTUS) injection 46 Units  46  Units Subcutaneous QHS Darrell Clos, MD   46 Units at 04/11/13 0159  . lactulose (CHRONULAC) 10 GM/15ML solution 30 g  30 g Oral TID Darrell Clos, MD   30 g at 04/11/13 1037  . lactulose (CHRONULAC) enema 300 mL  300 mL Rectal TID PRN Darrell Fickle, MD      . lisinopril (PRINIVIL,ZESTRIL) tablet 40 mg  40 mg Oral Daily Darrell Clos, MD   40 mg at 04/11/13 1040  . LORazepam (ATIVAN) tablet 1 mg  1 mg Oral Q6H PRN Darrell Clos, MD       Or  . LORazepam (ATIVAN) injection 1 mg  1 mg Intravenous Q6H PRN Darrell Clos, MD      . multivitamin with minerals tablet 1 tablet  1 tablet Oral Daily Darrell Clos, MD   1 tablet at 04/11/13 1040  . ofloxacin (OCUFLOX) 0.3 % ophthalmic solution 1 drop  1 drop Both Eyes QID Darrell Clos, MD   1 drop at 04/11/13 1504  . ondansetron (ZOFRAN) tablet 4 mg  4 mg Oral Q6H PRN Darrell Clos, MD       Or  . ondansetron [facility]) injection 4 mg  4 mg Intravenous Q6H PRN Darrell Clos, MD      . pantoprazole (PROTONIX) EC tablet 40 mg  40 mg Oral BID Darrell Clos, MD   40 mg at 04/11/13 1040  . prednisoLONE acetate (PRED FORTE) 1 % ophthalmic suspension 1 drop  1 drop Both Eyes TID Darrell Clos, MD   1 drop at 04/11/13 1053  . propranolol (INDERAL) tablet 10 mg  10 mg Oral BID Darrell Clos, MD   10 mg at 04/11/13 1041  . sodium chloride 0.9 % injection 3 mL  3 mL Intravenous Q12H Darrell Clos, MD   3 mL at 04/11/13 1103  . spironolactone (ALDACTONE) tablet 100 mg  100 mg Oral Daily Darrell Clos, MD   100 mg at 04/11/13 1041  . thiamine (VITAMIN B-1) tablet 100 mg  100 mg Oral Daily Darrell Clos, MD   100 mg at  04/11/13 1040   Or  . thiamine (B-1) injection 100 mg  100 mg Intravenous Daily Darrell Clos, MD        Psychiatric Specialty Exam:     Blood pressure 147/79, pulse 80, temperature 98 F (36.7 C), temperature source Oral, resp. rate 18, height 5\' 10"   (1.778 m), SpO2 96.00%.There is no weight on file to calculate BMI.  General Appearance: Tearful  Eye Contact::  Fair  Speech:  Slow  Volume:  Decreased  Mood:  Anxious and Depressed  Affect:  Constricted  Thought Process:  Coherent  Orientation:  Other:  Alert and oriented x3  Thought Content:  Rumination  Suicidal Thoughts:  No  Homicidal Thoughts:  No  Memory:  Difficulty recalling his thoughts  Judgement:  Fair  Insight:  Fair  Psychomotor Activity:  Decreased  Concentration:  Fair  Recall:  Fair  Akathisia:  No  Handed:  Right  AIMS (if indicated):     Assets:  Desire for Improvement  Sleep:      Treatment Plan Summary: Medication management Patient is experiencing depressive symptoms.  Currently he is on amitriptyline 25 mg at bedtime however it is not helping his depression.  Recommend to try Lexapro 10 mg and gradually increased to 20 mg in one week if not medically contraindicated.  Patient does require outpatient services for counseling and medication management.  He is not suicidal or homicidal and does not have criteria for inpatient services.  Social worker please arrange outpatient referral upon discharge for the medication management and counseling.  Please call [phone] every question. Sheridyn Canino T. 04/11/2013 3:23 PM

## 2013-04-12 DIAGNOSIS — F329 Major depressive disorder, single episode, unspecified: Secondary | ICD-10-CM

## 2013-04-12 DIAGNOSIS — F3130 Bipolar disorder, current episode depressed, mild or moderate severity, unspecified: Secondary | ICD-10-CM

## 2013-04-12 LAB — GLUCOSE, CAPILLARY

## 2013-04-12 MED ORDER — PROPRANOLOL HCL 20 MG PO TABS
20.0000 mg | ORAL_TABLET | Freq: Two times a day (BID) | ORAL | Status: DC
  Filled 2013-04-12: qty 1

## 2013-04-12 MED ORDER — ESCITALOPRAM OXALATE 10 MG PO TABS: 10.0000 mg | ORAL_TABLET | Freq: Every day | ORAL | Status: DC

## 2013-04-12 MED ORDER — LACTULOSE 10 GM/15ML PO SOLN
30.0000 g | Freq: Every day | ORAL | Status: DC
  Administered 2013-04-12: 30 g via ORAL
  Filled 2013-04-12: qty 45

## 2013-04-12 MED ORDER — PROPRANOLOL HCL 20 MG PO TABS: 20.0000 mg | ORAL_TABLET | Freq: Two times a day (BID) | ORAL | Status: DC

## 2013-04-12 MED ORDER — FOLIC ACID 1 MG PO TABS: 1.0000 mg | ORAL_TABLET | Freq: Every day | ORAL | Status: DC

## 2013-04-12 MED ORDER — AMITRIPTYLINE HCL 25 MG PO TABS: 25.0000 mg | ORAL_TABLET | Freq: Every day | ORAL | Status: DC

## 2013-04-12 MED ORDER — THIAMINE HCL 100 MG PO TABS: 100.0000 mg | ORAL_TABLET | Freq: Every day | ORAL | Status: DC

## 2013-04-12 NOTE — Progress Notes (Signed)
Patient and significant other [name], given all discharge instructions.  Patient and significant other verbalized an understanding of all discharge instructions.  Patient discharged in stable medical condition, with significant other providing transport home.  [facility] to provide home services.  Philomena Doheny RN
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
## 2013-04-12 NOTE — ED Provider Notes (Signed)
Medical screening examination/treatment/procedure(s) were performed by non-physician practitioner and as supervising physician I was immediately available for consultation/collaboration.  EKG Interpretation    Date/Time:  Wednesday April 10 2013 21:51:33 EST Ventricular Rate:  99 PR Interval:  175 QRS Duration: 88 QT Interval:  377 QTC Calculation: 484 R Axis:   30 Text Interpretation:  Sinus rhythm Borderline prolonged QT interval No significant change was found Confirmed by Wilkie Aye  MD, Willie Loy ([id number]) on 04/10/2013 9:58:19 PM             Shon Baton, MD 04/12/13 1930

## 2013-04-12 NOTE — Progress Notes (Signed)
Inpatient Diabetes Program Recommendations  AACE/ADA: New Consensus Statement on Inpatient Glycemic Control (2013)  Target Ranges:  Prepandial:   less than 140 mg/dL      Peak postprandial:   less than 180 mg/dL (1-2 hours)      Critically ill patients:  140 - 180 mg/dL   Reason for Visit: Hyperglycemia  Results for [name], [name] (MRN [id number]) as of 04/12/2013 12:28  Ref. Range 04/11/2013 11:43 04/11/2013 16:57 04/11/2013 20:43 04/12/2013 07:30 04/12/2013 11:43  Glucose-Capillary Latest Range: 70-99 mg/dL 295 (H) 284 (H) 132 (H) 148 (H) 260 (H)    Inpatient Diabetes Program Recommendations Correction (SSI): Increase Novolog to moderate tidwc and hs Insulin - Meal Coverage: Consider addition of meal coverage insulin - Novolog 4 units tidwc if pt eats >50% meal HgbA1C: 7.7% - needs adjustment of meds in outpatient setting Diet: Add CHO mod medium to heart healthy diet  Note: Will continue to follow. Thank you. Ailene Ards, RD, LDN, CDE Inpatient Diabetes Coordinator [phone]

## 2013-04-12 NOTE — Discharge Summary (Signed)
Physician Discharge Summary  [name] YNW:[id number] DOB: [birth year] DOA: 04/10/2013  PCP: Lonia Blood, MD  Admit date: 04/10/2013 Discharge date: 04/12/2013  Recommendations for Outpatient Follow-up:  1. Home health services PT/OT/RN/SW arranged for home  2. Follow up with primary care doctor within one month of discharge or sooner as needed, management of blood pressure and diabetes. 3. Follow up with gastroenterology at already scheduled appointment to discuss cirrhosis and complications from cirrhosis at already scheduled appointment.  Please adjust lactulose dose or start rifaximin if indicated.   4. Follow up with psychiatry within two weeks to determine if lexapro is working.    Discharge Diagnoses:  Principal Problem:   Hepatic encephalopathy Active Problems:   Diabetes mellitus   Cirrhosis   Pancytopenia   Falls   Discharge Condition: stable, improved  Diet recommendation: diabetic, low sodium  Wt Readings from Last 3 Encounters:  04/01/13 115.44 kg (254 lb 8 oz)  03/14/13 108.5 kg (239 lb 3.2 oz)  03/14/13 108.5 kg (239 lb 3.2 oz)    History of present illness:  [name] is a 57 y.o. male was brought from his patient was found to be lethargic and having frequent falls. Patient states that over last couple of days she's been following and patient also was found to be increasingly lethargic. Patient stated his been taking his medications as advised. In addition patient is also found to be depressed and I was initially some question about patient being suicidal but patient denies any suicidal or homicidal ideas. Patient is very tearful on exam. Denies any chest pain shortness of breath abdominal pain. Denies any nausea vomiting diarrhea. He states that he tries to walk he falls but denies any palpitations focal deficits. In the ER CT head did not show any acute. Ammonia levels were mildly elevated. Denies having drinking any alcohol recently. Patient was admitted last  week for repair encephalopathy which improved with lactulose. Patient was admitted last month for GI bleed and EGD showed only flat varices with no active bleed.   Hospital Course:   Lethargy and confusion was likely a combination of hepatic encephalopathy and sedating medications.    Hepatic encephalopathy:   Ammonia level was 93.  He was restarted on his home dose of lactulose 30gm by mouth three times per day which caused him to have 7 bowel movements in the first day.  His thinking cleared.  He needs 24 hour supervision due to the speed and ease with which he develops hepatic encephalopathy.  He quickly develops ataxia and frequent falls.  He will have a home health RN evaluate him in the next few days to ensure his thinking remains clear and have PT/OT assess him in his home to determine what safety measures may be taken to minimize his risk of injury during falls.  A social worker can help transition to ALF if he is not thriving at home.  His insurance declined payment for SNF at this time.    Depression:  He has had suicidal thoughts at home and was frequently tearful while in the hospital.  His amitriptyline dose was decreased due to sedation from 50mg  to 25mg  and he was seen by psychiatry.  He was started on lexapro 10mg  daily.  This is the maximum dose of lexapro recommended in the setting of cirrhosis.  He will need close outpatient follow up for his depression.    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  He continued diuretics, propranolol, ofloxacin prophylaxis.  T2DM, fingersticks mildly elevated.  Because he was not eating well initially, his lantus was decreased, however, he may resume his home insulin regimen.    HTN, blood pressure elevated.  His propranolol dose was increased to 20mg  BID.      Pancytopenia likely secondary to EtOH suppression and cirrhosis.  Blood counts remained relatively stable.  May be repeated by PCP or GI as outpatient.     EtOH without recent use. Takes ativan  prn. Continued thiamine.  Frequent falls, see above.   Consultants:  Psychiatry Procedures:  None Antibiotics:  Ofloxacin chronically   Discharge Exam: Filed Vitals:   04/12/13 1333  BP: 142/79  Pulse: 70  Temp: 98.4 F (36.9 C)  Resp: 20   Filed Vitals:   04/11/13 1941 04/11/13 2047 04/12/13 0530 04/12/13 1333  BP: 160/93 157/83 136/74 142/79  Pulse: 77 75 61 70  Temp:  98.7 F (37.1 C) 98 F (36.7 C) 98.4 F (36.9 C)  TempSrc:  Oral Oral Oral  Resp:  18 18 20   Height:      SpO2:  98% 99% 97%    General: CF, No acute distress  HEENT: NCAT, MMM  Cardiovascular: RRR, nl S1, S2 no mrg, 2+ pulses, warm extremities  Respiratory: CTAB, no increased WOB  Abdomen: NABS, soft, NT/ND  MSK: Normal tone and bulk, no LEE  Neuro: Grossly intact  Psych: Awake, alert, still tearful but less so than yesterday.     Discharge Instructions      Discharge Orders   Future Orders Complete By Expires   Call MD for:  difficulty breathing, headache or visual disturbances  As directed    Call MD for:  extreme fatigue  As directed    Call MD for:  hives  As directed    Call MD for:  persistant dizziness or light-headedness  As directed    Call MD for:  persistant nausea and vomiting  As directed    Call MD for:  severe uncontrolled pain  As directed    Call MD for:  temperature >100.4  As directed    Diet - low sodium heart healthy  As directed    Diet Carb Modified  As directed    Discharge instructions  As directed    Comments:     You were hospitalized with hepatic encephalopathy or confusion due to your liver cirrhosis.  The confusion is caused by a build up of ammonia in your blood.  You were treated with lactulose which gets rid of the extra ammonia in your blood stream.  You can think about it as if lactulose helps you poop out your extra ammonia and the more your poop, the more you keep your ammonia levels low.  The lower the ammonia levels, the clearer your thinking will  be.  You can adjust your dose of lactulose so that you have about 4-5 bowel movements per day.   Because amitriptyline can cause sleepiness which can be dangerous if you develop hepatic encephalopathy, your dose of amitriptyline was reduced to 25mg  per day.  For your depression, you were seen by psychiatry who recommended starting lexapro.  Please take 10mg  per day which is the maximum recommended dose in the setting of cirrhosis.  Please follow up with your psychiatrist within 2 weeks.  Please follow up with your gastroenterologist at your already scheduled appointment for your cirrhosis.   Increase activity slowly  As directed        Medication List  ALPRAZolam 1 MG tablet  Commonly known as:  XANAX  Take 1 mg by mouth every 6 (six) hours as needed for anxiety.     amitriptyline 25 MG tablet  Commonly known as:  ELAVIL  Take 1 tablet (25 mg total) by mouth at bedtime.     diclofenac sodium 1 % Gel  Commonly known as:  VOLTAREN  Apply 1 application topically daily as needed (back and leg pain).     escitalopram 10 MG tablet  Commonly known as:  LEXAPRO  Take 1 tablet (10 mg total) by mouth daily.     folic acid 1 MG tablet  Commonly known as:  FOLVITE  Take 1 tablet (1 mg total) by mouth daily.     furosemide 40 MG tablet  Commonly known as:  LASIX  Take 1 tablet (40 mg total) by mouth daily.     HYDROcodone-acetaminophen 5-325 MG per tablet  Commonly known as:  NORCO/VICODIN  Take 1 tablet by mouth 2 (two) times daily as needed (pain).     lactulose 10 GM/15ML solution  Commonly known as:  CHRONULAC  Take 30 mLs (20 g total) by mouth 3 (three) times daily.     LANTUS SOLOSTAR 100 UNIT/ML Sopn  Generic drug:  Insulin Glargine  Inject 46 Units into the skin at bedtime.     lisinopril 40 MG tablet  Commonly known as:  PRINIVIL,ZESTRIL  Take 1 tablet (40 mg total) by mouth daily. For high blood pressure management     multivitamin with minerals Tabs tablet  Take  1 tablet by mouth daily. (Resume with the vitamins that you have at home): For low vitamin     ofloxacin 0.3 % ophthalmic solution  Commonly known as:  OCUFLOX  Place 1 drop into both eyes 4 (four) times daily.     pantoprazole 40 MG tablet  Commonly known as:  PROTONIX  Take 1 tablet (40 mg total) by mouth 2 (two) times daily.     prednisoLONE acetate 1 % ophthalmic suspension  Commonly known as:  PRED FORTE  Place 1 drop into both eyes 3 (three) times daily.     propranolol 20 MG tablet  Commonly known as:  INDERAL  Take 1 tablet (20 mg total) by mouth 2 (two) times daily.     spironolactone 100 MG tablet  Commonly known as:  ALDACTONE  Take 1 tablet (100 mg total) by mouth daily.     thiamine 100 MG tablet  Take 1 tablet (100 mg total) by mouth daily.       Follow-up Information   Call Department of Social Services. (To apply for Medicaid)    Contact information:   [address] [place], [place] [address] [phone]      Schedule an appointment as soon as possible for a visit with Lonia Blood, MD.   Specialty:  Internal Medicine   Contact information:   [address] [place] [address] [phone]        The results of significant diagnostics from this hospitalization (including imaging, microbiology, ancillary and laboratory) are listed below for reference.    Significant Diagnostic Studies: Dg Chest 2 View  04/10/2013   CLINICAL DATA:  Weakness  EXAM: CHEST  2 VIEW  COMPARISON:  March 31, 2013  FINDINGS: Lungs are clear. Heart is upper normal in size with normal pulmonary vascularity. No adenopathy. No bone lesions.  IMPRESSION: No edema or consolidation.   Electronically Signed   By: Chrissie Noa  Margarita Grizzle M.D.   On: 04/10/2013 21:19   Dg Chest 2 View  04/01/2013   CLINICAL DATA:  Shortness of breath and weakness.  EXAM: CHEST  2 VIEW  COMPARISON:  Chest radiograph performed 03/11/2013  FINDINGS: The lungs are well-aerated. Pulmonary vascularity is  at the upper limits of normal. There is no evidence of focal opacification, pleural effusion or pneumothorax.  The heart is normal in size; the mediastinal contour is within normal limits. No acute osseous abnormalities are seen.  IMPRESSION: No active cardiopulmonary disease.   Electronically Signed   By: Roanna Raider M.D.   On: 04/01/2013 00:12   Ct Head Wo Contrast  04/10/2013   CLINICAL DATA:  Altered mental status.  Fatigue.  History of stroke.  EXAM: CT HEAD WITHOUT CONTRAST  CT CERVICAL SPINE WITHOUT CONTRAST  TECHNIQUE: Multidetector CT imaging of the head and cervical spine was performed following the standard protocol without intravenous contrast. Multiplanar CT image reconstructions of the cervical spine were also generated.  COMPARISON:  03/31/2013  FINDINGS: CT HEAD FINDINGS  The brainstem, cerebellum, cerebral peduncles, thalamus, basal ganglia, basilar cisterns, and ventricular system appear within normal limits. No intracranial hemorrhage, mass lesion, or acute CVA.  CT CERVICAL SPINE FINDINGS  No cervical spine fracture or significant abnormal subluxation. No prevertebral soft tissue swelling or significant bony lesion observed. Mild degenerative distal clavicular findings along the sternoclavicular joint noted.  IMPRESSION: 1. No significant abnormalities involving the brain or cervical spine.   Electronically Signed   By: Herbie Baltimore M.D.   On: 04/10/2013 21:50   Ct Head Wo Contrast - If Head Trauma Is Known/suspected And/or Pt Is Taking Anticoagulant  03/31/2013   CLINICAL DATA:  Weakness  EXAM: CT HEAD WITHOUT CONTRAST  TECHNIQUE: Contiguous axial images were obtained from the base of the skull through the vertex without intravenous contrast.  COMPARISON:  Prior CT from 10/06/2012  FINDINGS: No acute intracranial hemorrhage or large vessel territory infarct. No mass or midline shift. No hydrocephalus. Extra-axial spaces are normal. Calvarium is intact. Orbits are within normal  limits. Paranasal sinuses and mastoid air cells are clear.  IMPRESSION: Normal head CT with no acute intracranial process.   Electronically Signed   By: Rise Mu M.D.   On: 03/31/2013 23:49   Ct Cervical Spine Wo Contrast  04/10/2013   CLINICAL DATA:  Altered mental status.  Fatigue.  History of stroke.  EXAM: CT HEAD WITHOUT CONTRAST  CT CERVICAL SPINE WITHOUT CONTRAST  TECHNIQUE: Multidetector CT imaging of the head and cervical spine was performed following the standard protocol without intravenous contrast. Multiplanar CT image reconstructions of the cervical spine were also generated.  COMPARISON:  03/31/2013  FINDINGS: CT HEAD FINDINGS  The brainstem, cerebellum, cerebral peduncles, thalamus, basal ganglia, basilar cisterns, and ventricular system appear within normal limits. No intracranial hemorrhage, mass lesion, or acute CVA.  CT CERVICAL SPINE FINDINGS  No cervical spine fracture or significant abnormal subluxation. No prevertebral soft tissue swelling or significant bony lesion observed. Mild degenerative distal clavicular findings along the sternoclavicular joint noted.  IMPRESSION: 1. No significant abnormalities involving the brain or cervical spine.   Electronically Signed   By: Herbie Baltimore M.D.   On: 04/10/2013 21:50   US Abdomen Complete  04/01/2013   CLINICAL DATA:  57 year old male with increased abdominal girth in the setting of cirrhosis. Ascites suspected. Initial encounter.  EXAM: ULTRASOUND ABDOMEN COMPLETE  COMPARISON:  Chest CTA 12/13/2012.  FINDINGS: Gallbladder  Mild wall thickening,  up to 3 mm. No pericholecystic fluid. No echogenic stones. No sonographic Murphy sign elicited.  Common bile duct  Diameter: 6 mm, within normal limits  Liver  Coarse and difficult to penetrate. No discrete liver lesion identified. No intrahepatic ductal dilatation.  IVC  No abnormality visualized.  Pancreas  Poorly visible due to overlying bowel gas.  Spleen  Splenomegaly. Splenic  volume 808 mL (normal splenic volume range 83 - 412 mL). No discrete splenic lesion identified.  Right Kidney  Length: 12.9 cm. Echogenicity within normal limits. No mass or hydronephrosis visualized.  Left Kidney  Length: 13.9 cm. Echogenicity within normal limits. No mass or hydronephrosis visualized.  Abdominal aorta  Incompletely visualized due to overlying bowel gas, visualized portions within normal limits.  Other findings: No ascites identified. Evidence of abdominal varices re- identified.  IMPRESSION: 1. Cirrhosis with splenomegaly, but no ascites.  2. No discrete liver lesion.  3. Mild gallbladder wall thickening, likely this sequelae of chronic liver disease.   Electronically Signed   By: Augusto Gamble M.D.   On: 04/01/2013 17:55    Microbiology: No results found for this or any previous visit (from the past 240 hour(s)).   Labs: Basic Metabolic Panel:  Recent Labs Lab 04/10/13 2045 04/11/13 0505  NA 137 137  K 4.2 3.7  CL 105 104  CO2 23 23  GLUCOSE 174* 148*  BUN 11 11  CREATININE 0.83 0.83  CALCIUM 9.4 9.2   Liver Function Tests:  Recent Labs Lab 04/10/13 2045 04/11/13 0505  AST 50* 48*  ALT 20 19  ALKPHOS 95 95  BILITOT 2.5* 2.4*  PROT 6.8 6.5  ALBUMIN 2.7* 2.6*   No results found for this basename: LIPASE, AMYLASE,  in the last 168 hours  Recent Labs Lab 04/10/13 2100  AMMONIA 93*   CBC:  Recent Labs Lab 04/10/13 2045 04/11/13 0505  WBC 3.2* 3.3*  NEUTROABS 2.0  --   HGB 10.9* 10.1*  HCT 31.8* 29.6*  MCV 89.8 90.0  PLT 62* 61*   Cardiac Enzymes: No results found for this basename: CKTOTAL, CKMB, CKMBINDEX, TROPONINI,  in the last 168 hours BNP: BNP (last 3 results) No results found for this basename: PROBNP,  in the last 8760 hours CBG:  Recent Labs Lab 04/11/13 1143 04/11/13 1657 04/11/13 2043 04/12/13 0730 04/12/13 1143  GLUCAP 205* 211* 211* 148* 260*    Time coordinating discharge: 45 minutes  Signed:  Nili Honda,  Mauriah Mcmillen  [facility] 04/12/2013, 2:51 PM

## 2013-04-12 NOTE — Progress Notes (Signed)
Physical Therapy Treatment Patient Details Name: [name] MRN: [id number] DOB: 1955/07/13 Today's Date: 04/12/2013 Time: 9629-5284 PT Time Calculation (min): 15 min  PT Assessment / Plan / Recommendation  History of Present Illness 57 yo male admitted with hepatic encephalopathy, lethargy, falls. Hx of neuropathy, bipolar d/o, CVA, chronic pain, cirrhosis   PT Comments   Improved mobility this session compared to yesterday. Still unsteady intermittently. Needs to use walker for ambulation-pt states he has one available at thome. Continue to recommend 24 hour supervision for safety.   Follow Up Recommendations  No PT follow up;Supervision/Assistance - 24 hour     Does the patient have the potential to tolerate intense rehabilitation     Barriers to Discharge        Equipment Recommendations  None recommended by PT    Recommendations for Other Services    Frequency Min 3X/week   Progress towards PT Goals Progress towards PT goals: Progressing toward goals  Plan Current plan remains appropriate    Precautions / Restrictions Precautions Precautions: Fall Restrictions Weight Bearing Restrictions: No   Pertinent Vitals/Pain No c/opain.     Mobility  Bed Mobility Bed Mobility: Not assessed Details for Bed Mobility Assistance: pt in bathroom when therapist entered Transfers Transfers: Sit to Stand;Stand to Sit Sit to Stand: 5: Supervision;From toilet Stand to Sit: 5: Supervision;To chair/3-in-1 Ambulation/Gait Ambulation/Gait Assistance: 4: Min guard Ambulation Distance (Feet): 160 Feet Assistive device: Rolling walker Ambulation/Gait Assistance Details: close guard for safety Gait Pattern: Step-through pattern Stairs: Yes Stairs Assistance: 4: Min guard Stairs Assistance Details (indicate cue type and reason): close guard for safety.  Stair Management Technique: One rail Left;Alternating pattern;Forwards Number of Stairs: 8    Exercises     PT Diagnosis:    PT  Problem List:   PT Treatment Interventions:     PT Goals (current goals can now be found in the care plan section)    Visit Information  Last PT Received On: 04/12/13 Assistance Needed: +1 History of Present Illness: 57 yo male admitted with hepatic encephalopathy, lethargy, falls. Hx of neuropathy, bipolar d/o, CVA, chronic pain, cirrhosis    Subjective Data      Cognition  Cognition Arousal/Alertness: Awake/alert Behavior During Therapy: WFL for tasks assessed/performed Overall Cognitive Status: Within Functional Limits for tasks assessed    Balance  Balance Balance Assessed: Yes Static Standing Balance Static Standing - Balance Support: No upper extremity supported Static Standing - Level of Assistance: 4: Min assist Static Standing - Comment/# of Minutes: LOB x1 posteriorly requiring Min assist to prevent fall.  Dynamic Standing Balance Dynamic Standing - Balance Support: Bilateral upper extremity supported Dynamic Standing - Level of Assistance: 5: Stand by assistance  End of Session PT - End of Session Equipment Utilized During Treatment: Gait belt Activity Tolerance: Patient tolerated treatment well Patient left: in chair;with call bell/phone within reach   GP     Rebeca Alert, MPT Pager: [phone]

## 2013-04-12 NOTE — Progress Notes (Signed)
Met with pt to discuss d/c planning. PT is not recommending any follow up at home but pt would benefit form HHRN and SW services. He asked me to call his fiance Norm Earl Lites to discuss this. Se chose [facility] to provide the services. Referral made. MD will need to enter the orders in EPIC.  Algernon Huxley RN BSN   [phone]

## 2013-04-13 NOTE — ED Notes (Signed)
Patient's family member called stating he was discharged without his belongings-missing pants and wallet-RN looked in lockers and did not find belongings-will check with security to see if wallet was locked up

## 2013-06-19 ENCOUNTER — Other Ambulatory Visit: Payer: Self-pay | Admitting: Ophthalmology

## 2013-07-05 ENCOUNTER — Encounter (HOSPITAL_COMMUNITY): Payer: Self-pay | Admitting: *Deleted

## 2013-07-05 NOTE — Progress Notes (Signed)
[name] significant other/friend, Serita Butcher verified medications and medical/surgical hx for pt. Pt has encephalopathy due to cirrhosis and unable to give information.

## 2013-07-08 ENCOUNTER — Ambulatory Visit (HOSPITAL_COMMUNITY): Payer: Medicare PPO | Admitting: Anesthesiology

## 2013-07-08 ENCOUNTER — Encounter (HOSPITAL_COMMUNITY): Payer: Self-pay | Admitting: *Deleted

## 2013-07-08 ENCOUNTER — Ambulatory Visit (HOSPITAL_COMMUNITY)
Admission: RE | Admit: 2013-07-08 | Discharge: 2013-07-08 | Disposition: A | Discharge status: Home / Self Care | Payer: Medicare PPO | Source: Ambulatory Visit | Attending: Ophthalmology | Admitting: Ophthalmology

## 2013-07-08 ENCOUNTER — Encounter (HOSPITAL_COMMUNITY): Payer: Medicare PPO | Admitting: Anesthesiology

## 2013-07-08 ENCOUNTER — Encounter (HOSPITAL_COMMUNITY)
Admission: RE | Disposition: A | Discharge status: Home / Self Care | Payer: Self-pay | Source: Ambulatory Visit | Attending: Ophthalmology

## 2013-07-08 DIAGNOSIS — Y831 Surgical operation with implant of artificial internal device as the cause of abnormal reaction of the patient, or of later complication, without mention of misadventure at the time of the procedure: Secondary | ICD-10-CM | POA: Insufficient documentation

## 2013-07-08 DIAGNOSIS — H27139 Posterior dislocation of lens, unspecified eye: Secondary | ICD-10-CM

## 2013-07-08 DIAGNOSIS — Z9849 Cataract extraction status, unspecified eye: Secondary | ICD-10-CM | POA: Insufficient documentation

## 2013-07-08 DIAGNOSIS — Z794 Long term (current) use of insulin: Secondary | ICD-10-CM | POA: Insufficient documentation

## 2013-07-08 DIAGNOSIS — T8529XA Other mechanical complication of intraocular lens, initial encounter: Secondary | ICD-10-CM | POA: Insufficient documentation

## 2013-07-08 DIAGNOSIS — M129 Arthropathy, unspecified: Secondary | ICD-10-CM | POA: Insufficient documentation

## 2013-07-08 DIAGNOSIS — K7460 Unspecified cirrhosis of liver: Secondary | ICD-10-CM | POA: Insufficient documentation

## 2013-07-08 DIAGNOSIS — D649 Anemia, unspecified: Secondary | ICD-10-CM | POA: Insufficient documentation

## 2013-07-08 DIAGNOSIS — IMO0002 Reserved for concepts with insufficient information to code with codable children: Secondary | ICD-10-CM | POA: Insufficient documentation

## 2013-07-08 DIAGNOSIS — F3130 Bipolar disorder, current episode depressed, mild or moderate severity, unspecified: Secondary | ICD-10-CM | POA: Insufficient documentation

## 2013-07-08 DIAGNOSIS — F1010 Alcohol abuse, uncomplicated: Secondary | ICD-10-CM | POA: Insufficient documentation

## 2013-07-08 DIAGNOSIS — Z961 Presence of intraocular lens: Secondary | ICD-10-CM | POA: Insufficient documentation

## 2013-07-08 DIAGNOSIS — I10 Essential (primary) hypertension: Secondary | ICD-10-CM | POA: Insufficient documentation

## 2013-07-08 DIAGNOSIS — K7290 Hepatic failure, unspecified without coma: Secondary | ICD-10-CM | POA: Insufficient documentation

## 2013-07-08 DIAGNOSIS — Z8673 Personal history of transient ischemic attack (TIA), and cerebral infarction without residual deficits: Secondary | ICD-10-CM | POA: Insufficient documentation

## 2013-07-08 DIAGNOSIS — T8522XA Displacement of intraocular lens, initial encounter: Secondary | ICD-10-CM

## 2013-07-08 DIAGNOSIS — G8929 Other chronic pain: Secondary | ICD-10-CM | POA: Insufficient documentation

## 2013-07-08 DIAGNOSIS — J438 Other emphysema: Secondary | ICD-10-CM | POA: Insufficient documentation

## 2013-07-08 DIAGNOSIS — K7682 Hepatic encephalopathy: Secondary | ICD-10-CM | POA: Insufficient documentation

## 2013-07-08 DIAGNOSIS — F17200 Nicotine dependence, unspecified, uncomplicated: Secondary | ICD-10-CM | POA: Insufficient documentation

## 2013-07-08 DIAGNOSIS — K219 Gastro-esophageal reflux disease without esophagitis: Secondary | ICD-10-CM | POA: Insufficient documentation

## 2013-07-08 DIAGNOSIS — K2270 Barrett's esophagus without dysplasia: Secondary | ICD-10-CM | POA: Insufficient documentation

## 2013-07-08 DIAGNOSIS — G589 Mononeuropathy, unspecified: Secondary | ICD-10-CM | POA: Insufficient documentation

## 2013-07-08 DIAGNOSIS — E119 Type 2 diabetes mellitus without complications: Secondary | ICD-10-CM | POA: Insufficient documentation

## 2013-07-08 HISTORY — DX: Barrett's esophagus without dysplasia: K22.70

## 2013-07-08 HISTORY — DX: Serous retinal detachment, unspecified eye: H33.20

## 2013-07-08 HISTORY — DX: Hepatic encephalopathy: K76.82

## 2013-07-08 HISTORY — PX: INTRAOCULAR LENS REMOVAL: SHX5339

## 2013-07-08 HISTORY — DX: Chronic obstructive pulmonary disease, unspecified: J44.9

## 2013-07-08 HISTORY — DX: Bronchitis, not specified as acute or chronic: J40

## 2013-07-08 HISTORY — DX: Hepatic failure, unspecified without coma: K72.90

## 2013-07-08 HISTORY — PX: PLACEMENT AND SUTURE OF SECONDARY INTRAOCULAR LENS: SHX5338

## 2013-07-08 HISTORY — DX: Gastro-esophageal reflux disease without esophagitis: K21.9

## 2013-07-08 HISTORY — PX: PARS PLANA VITRECTOMY: SHX2166

## 2013-07-08 HISTORY — DX: Anemia, unspecified: D64.9

## 2013-07-08 LAB — COMPREHENSIVE METABOLIC PANEL
ALBUMIN: 3.2 g/dL — AB (ref 3.5–5.2)
ALT: 23 U/L (ref 0–53)
AST: 41 U/L — AB (ref 0–37)
Alkaline Phosphatase: 92 U/L (ref 39–117)
BUN: 21 mg/dL (ref 6–23)
CALCIUM: 9 mg/dL (ref 8.4–10.5)
CO2: 24 meq/L (ref 19–32)
Chloride: 101 mEq/L (ref 96–112)
Creatinine, Ser: 1.14 mg/dL (ref 0.50–1.35)
GFR calc Af Amer: 81 mL/min — ABNORMAL LOW (ref >90)
GFR, EST NON AFRICAN AMERICAN: 70 mL/min — AB (ref >90)
Glucose, Bld: 269 mg/dL — ABNORMAL HIGH (ref 70–99)
Potassium: 4.6 mEq/L (ref 3.7–5.3)
SODIUM: 138 meq/L (ref 137–147)
Total Bilirubin: 1.8 mg/dL — ABNORMAL HIGH (ref 0.3–1.2)
Total Protein: 7.3 g/dL (ref 6.0–8.3)

## 2013-07-08 LAB — CBC
HCT: 33.9 % — ABNORMAL LOW (ref 39.0–52.0)
Hemoglobin: 11.9 g/dL — ABNORMAL LOW (ref 13.0–17.0)
MCH: 31.3 pg (ref 26.0–34.0)
MCHC: 35.1 g/dL (ref 30.0–36.0)
MCV: 89.2 fL (ref 78.0–100.0)
PLATELETS: 58 10*3/uL — AB (ref 150–400)
RBC: 3.8 MIL/uL — AB (ref 4.22–5.81)
RDW: 17.9 % — AB (ref 11.5–15.5)
WBC: 3.9 10*3/uL — ABNORMAL LOW (ref 4.0–10.5)

## 2013-07-08 LAB — GLUCOSE, CAPILLARY
GLUCOSE-CAPILLARY: 239 mg/dL — AB (ref 70–99)
GLUCOSE-CAPILLARY: 190 mg/dL — AB (ref 70–99)
Glucose-Capillary: 211 mg/dL — ABNORMAL HIGH (ref 70–99)

## 2013-07-08 SURGERY — PARS PLANA VITRECTOMY WITH 25G REMOVAL/SUTURE INTRAOCULAR LENS
Anesthesia: Monitor Anesthesia Care | Laterality: Left

## 2013-07-08 SURGERY — PARS PLANA VITRECTOMY WITH 25 GAUGE
Anesthesia: Monitor Anesthesia Care | Site: Eye | Laterality: Left

## 2013-07-08 MED ORDER — HYPROMELLOSE (GONIOSCOPIC) 2.5 % OP SOLN
OPHTHALMIC | Status: DC | PRN
  Administered 2013-07-08: 1 [drp] via OPHTHALMIC

## 2013-07-08 MED ORDER — BSS PLUS IO SOLN
INTRAOCULAR | Status: AC
  Filled 2013-07-08: qty 500

## 2013-07-08 MED ORDER — PROPOFOL 10 MG/ML IV BOLUS
INTRAVENOUS | Status: AC
  Filled 2013-07-08: qty 20

## 2013-07-08 MED ORDER — OXYCODONE HCL 5 MG PO TABS
5.0000 mg | ORAL_TABLET | Freq: Once | ORAL | Status: AC | PRN
  Administered 2013-07-08: 5 mg via ORAL

## 2013-07-08 MED ORDER — TROPICAMIDE 1 % OP SOLN
1.0000 [drp] | OPHTHALMIC | Status: AC | PRN
  Administered 2013-07-08 (×3): 1 [drp] via OPHTHALMIC
  Filled 2013-07-08: qty 3

## 2013-07-08 MED ORDER — MIDAZOLAM HCL 5 MG/5ML IJ SOLN
INTRAMUSCULAR | Status: DC | PRN
  Administered 2013-07-08: 2 mg via INTRAVENOUS

## 2013-07-08 MED ORDER — SODIUM CHLORIDE 0.9 % IV SOLN
INTRAVENOUS | Status: DC | PRN
  Administered 2013-07-08: 13:00:00 via INTRAVENOUS

## 2013-07-08 MED ORDER — NA CHONDROIT SULF-NA HYALURON 40-30 MG/ML IO SOLN
INTRAOCULAR | Status: AC
  Filled 2013-07-08: qty 0.5

## 2013-07-08 MED ORDER — PHENYLEPHRINE HCL 2.5 % OP SOLN
1.0000 [drp] | OPHTHALMIC | Status: AC | PRN
  Administered 2013-07-08 (×3): 1 [drp] via OPHTHALMIC
  Filled 2013-07-08: qty 15

## 2013-07-08 MED ORDER — MIDAZOLAM HCL 2 MG/2ML IJ SOLN
INTRAMUSCULAR | Status: AC
  Filled 2013-07-08: qty 2

## 2013-07-08 MED ORDER — NA CHONDROIT SULF-NA HYALURON 40-30 MG/ML IO SOLN
INTRAOCULAR | Status: DC | PRN
  Administered 2013-07-08 (×2): 0.5 mL via INTRAOCULAR

## 2013-07-08 MED ORDER — LIDOCAINE HCL 2 % IJ SOLN
INTRAMUSCULAR | Status: DC | PRN
  Administered 2013-07-08: 20 mL

## 2013-07-08 MED ORDER — EPINEPHRINE HCL 1 MG/ML IJ SOLN
INTRAMUSCULAR | Status: AC
  Filled 2013-07-08: qty 1

## 2013-07-08 MED ORDER — DEXAMETHASONE SODIUM PHOSPHATE 10 MG/ML IJ SOLN
INTRAMUSCULAR | Status: AC
  Filled 2013-07-08: qty 1

## 2013-07-08 MED ORDER — HYDROMORPHONE HCL PF 1 MG/ML IJ SOLN: 0.2500 mg | INTRAMUSCULAR | Status: DC | PRN

## 2013-07-08 MED ORDER — OXYCODONE HCL 5 MG/5ML PO SOLN: 5.0000 mg | Freq: Once | ORAL | Status: AC | PRN

## 2013-07-08 MED ORDER — OXYCODONE HCL 5 MG PO TABS
ORAL_TABLET | ORAL | Status: AC
  Filled 2013-07-08: qty 1

## 2013-07-08 MED ORDER — FENTANYL CITRATE 0.05 MG/ML IJ SOLN
INTRAMUSCULAR | Status: AC
  Filled 2013-07-08: qty 5

## 2013-07-08 MED ORDER — HYPROMELLOSE (GONIOSCOPIC) 2.5 % OP SOLN
OPHTHALMIC | Status: AC
  Filled 2013-07-08: qty 15

## 2013-07-08 MED ORDER — GATIFLOXACIN 0.5 % OP SOLN
1.0000 [drp] | OPHTHALMIC | Status: AC | PRN
  Administered 2013-07-08 (×3): 1 [drp] via OPHTHALMIC
  Filled 2013-07-08: qty 2.5

## 2013-07-08 MED ORDER — PROMETHAZINE HCL 25 MG/ML IJ SOLN: 6.2500 mg | INTRAMUSCULAR | Status: DC | PRN

## 2013-07-08 MED ORDER — EPINEPHRINE HCL 1 MG/ML IJ SOLN
INTRAOCULAR | Status: DC | PRN
  Administered 2013-07-08: 12:00:00

## 2013-07-08 MED ORDER — SODIUM CHLORIDE 0.9 % IV SOLN: INTRAVENOUS | Status: DC

## 2013-07-08 MED ORDER — FENTANYL CITRATE 0.05 MG/ML IJ SOLN
INTRAMUSCULAR | Status: DC | PRN
  Administered 2013-07-08: 25 ug via INTRAVENOUS
  Administered 2013-07-08: 50 ug via INTRAVENOUS
  Administered 2013-07-08: 25 ug via INTRAVENOUS

## 2013-07-08 MED ORDER — PROPOFOL 10 MG/ML IV BOLUS
INTRAVENOUS | Status: DC | PRN
  Administered 2013-07-08: 30 mg via INTRAVENOUS
  Administered 2013-07-08: 10 mg via INTRAVENOUS
  Administered 2013-07-08: 20 mg via INTRAVENOUS
  Administered 2013-07-08 (×3): 10 mg via INTRAVENOUS

## 2013-07-08 MED ORDER — DEXAMETHASONE SODIUM PHOSPHATE 10 MG/ML IJ SOLN
INTRAMUSCULAR | Status: DC | PRN
  Administered 2013-07-08: 10 mg via INTRAVENOUS

## 2013-07-08 MED ORDER — PROPRANOLOL HCL 20 MG PO TABS
20.0000 mg | ORAL_TABLET | Freq: Once | ORAL | Status: AC
  Administered 2013-07-08: 20 mg via ORAL
  Filled 2013-07-08: qty 1

## 2013-07-08 MED ORDER — LIDOCAINE HCL 2 % IJ SOLN
INTRAMUSCULAR | Status: AC
  Filled 2013-07-08: qty 20

## 2013-07-08 SURGICAL SUPPLY — 66 items
APL SRG 3 HI ABS STRL LF PLS (MISCELLANEOUS)
APPLICATOR COTTON TIP 6IN STRL (MISCELLANEOUS) ×3 IMPLANT
APPLICATOR DR MATTHEWS STRL (MISCELLANEOUS) IMPLANT
BLADE MVR KNIFE 20G (BLADE) ×3 IMPLANT
CANNULA ANT CHAM MAIN (OPHTHALMIC RELATED) IMPLANT
CANNULA VLV SOFT TIP 25GA (OPHTHALMIC) ×3 IMPLANT
CORDS BIPOLAR (ELECTRODE) IMPLANT
COVER MAYO STAND STRL (DRAPES) ×3 IMPLANT
DRAPE INCISE 51X51 W/FILM STRL (DRAPES) IMPLANT
DRAPE OPHTHALMIC 77X100 STRL (CUSTOM PROCEDURE TRAY) ×3 IMPLANT
FILTER BLUE MILLIPORE (MISCELLANEOUS) IMPLANT
FORCEPS ECKARDT ILM 25G SERR (OPHTHALMIC RELATED) ×3 IMPLANT
FORCEPS GRIESHABER ILM 25G A (INSTRUMENTS) IMPLANT
FORCEPS HORIZONTAL 25G DISP (OPHTHALMIC RELATED) IMPLANT
FORCEPS ILM 25G DSP TIP (MISCELLANEOUS) ×3 IMPLANT
GAS AUTO FILL CONSTEL (OPHTHALMIC)
GAS AUTO FILL CONSTELLATION (OPHTHALMIC) IMPLANT
GLOVE BIO SURGEON STRL SZ7.5 (GLOVE) ×3 IMPLANT
GLOVE BIOGEL PI IND STRL 6.5 (GLOVE) ×1 IMPLANT
GLOVE BIOGEL PI INDICATOR 6.5 (GLOVE) ×2
GLOVE SS BIOGEL STRL SZ 8.5 (GLOVE) ×1 IMPLANT
GLOVE SUPERSENSE BIOGEL SZ 8.5 (GLOVE) ×2
GOWN PREVENTION PLUS XLARGE (GOWN DISPOSABLE) ×3 IMPLANT
GOWN STRL NON-REIN LRG LVL3 (GOWN DISPOSABLE) ×3 IMPLANT
HANDLE PNEUMATIC FOR CONSTEL (OPHTHALMIC) IMPLANT
KIT BASIN OR (CUSTOM PROCEDURE TRAY) ×3 IMPLANT
KIT PERFLUORON PROCEDURE 5ML (MISCELLANEOUS) IMPLANT
KIT ROOM TURNOVER OR (KITS) ×3 IMPLANT
KNIFE CRESCENT 2.5 55 ANG (BLADE) IMPLANT
LENS BIOM SUPER VIEW SET DISP (OPHTHALMIC RELATED) ×3 IMPLANT
LENS IOL +13.5 DIOP CNVXPLN (Intraocular Lens) ×1 IMPLANT
LENS IOL KELMAN MULTIFLEX 13.5 (Intraocular Lens) ×3 IMPLANT
MASK EYE SHIELD (GAUZE/BANDAGES/DRESSINGS) ×3 IMPLANT
MICROPICK 25G (MISCELLANEOUS)
NEEDLE 18GX1X1/2 (RX/OR ONLY) (NEEDLE) IMPLANT
NEEDLE 25GX 5/8IN NON SAFETY (NEEDLE) IMPLANT
NEEDLE FILTER BLUNT 18X 1/2SAF (NEEDLE)
NEEDLE FILTER BLUNT 18X1 1/2 (NEEDLE) IMPLANT
NEEDLE HYPO 25GX1X1/2 BEV (NEEDLE) IMPLANT
NS IRRIG 1000ML POUR BTL (IV SOLUTION) ×3 IMPLANT
PACK FRAGMATOME (OPHTHALMIC) IMPLANT
PACK VITRECTOMY CUSTOM (CUSTOM PROCEDURE TRAY) ×3 IMPLANT
PAD ARMBOARD 7.5X6 YLW CONV (MISCELLANEOUS) ×6 IMPLANT
PAD EYE OVAL STERILE LF (GAUZE/BANDAGES/DRESSINGS) ×3 IMPLANT
PAK PIK VITRECTOMY CVS 25GA (OPHTHALMIC) ×3 IMPLANT
PENCIL BIPOLAR 25GA STR DISP (OPHTHALMIC RELATED) IMPLANT
PICK MICROPICK 25G (MISCELLANEOUS) IMPLANT
PROBE LASER ILLUM FLEX CVD 25G (OPHTHALMIC) ×3 IMPLANT
ROLLS DENTAL (MISCELLANEOUS) IMPLANT
SCRAPER DIAMOND 25GA (OPHTHALMIC RELATED) IMPLANT
SET FLUID INJECTOR (SET/KITS/TRAYS/PACK) IMPLANT
STOCKINETTE IMPERVIOUS 9X36 MD (GAUZE/BANDAGES/DRESSINGS) ×6 IMPLANT
STOPCOCK 4 WAY LG BORE MALE ST (IV SETS) IMPLANT
SUT ETHILON 10 0 CS140 6 (SUTURE) ×3 IMPLANT
SUT ETHILON 8 0 BV130 4 (SUTURE) IMPLANT
SUT MERSILENE 5 0 RD 1 DA (SUTURE) IMPLANT
SUT PROLENE 10 0 CIF 4 DA (SUTURE) IMPLANT
SUT VICRYL 7 0 TG140 8 (SUTURE) IMPLANT
SYR 30ML SLIP (SYRINGE) IMPLANT
SYR 5ML LL (SYRINGE) IMPLANT
SYR TB 1ML LUER SLIP (SYRINGE) ×3 IMPLANT
TAPE SURG TRANSPORE 1 IN (GAUZE/BANDAGES/DRESSINGS) ×1 IMPLANT
TAPE SURGICAL TRANSPORE 1 IN (GAUZE/BANDAGES/DRESSINGS) ×2
TOWEL OR 17X26 10 PK STRL BLUE (TOWEL DISPOSABLE) ×6 IMPLANT
WATER STERILE IRR 1000ML POUR (IV SOLUTION) ×3 IMPLANT
WIPE INSTRUMENT VISIWIPE 73X73 (MISCELLANEOUS) ×3 IMPLANT

## 2013-07-08 NOTE — Discharge Instructions (Signed)

## 2013-07-08 NOTE — Preoperative (Signed)
Beta Blockers   Reason not to administer Beta Blockers:Not Applicable 

## 2013-07-08 NOTE — Brief Op Note (Signed)
07/08/2013  1:46 PM  PATIENT:  [name]  58 y.o. male  PRE-OPERATIVE DIAGNOSIS:  996.53 Dislocation of Intraocular Lens,,aphakia left eye.  POST-OPERATIVE DIAGNOSIS:  996.53 Dislocation of Intraocular Lens, same  PROCEDURE:  POSTERIOR VITRECTOMY , REMOVAL OF INTRAOCULAR LENS FROM VITREOUS CAVITY, LEFT EYE.  INSERTION OF ANTERIOR CHAMBER IOL OS. SURGEON:  Surgeon(s) and Role:    * Hurman Horn, MD - Primary  PHYSICIAN ASSISTANT:   ASSISTANTS: none   ANESTHESIA:   MAC  EBL:     BLOOD ADMINISTERED:none  DRAINS: none   LOCAL MEDICATIONS USED:  LIDOCAINE 2%,, 5 CC RETROBULBAR  SPECIMEN:  No Specimen  DISPOSITION OF SPECIMEN:  N/A  COUNTS:  YES  TOURNIQUET:  * No tourniquets in log *  DICTATION: .Other Dictation: Dictation Number [id number]  PLAN OF CARE: Discharge to home after PACU  PATIENT DISPOSITION:  PACU - hemodynamically stable.   Delay start of Pharmacological VTE agent (>24hrs) due to surgical blood loss or risk of bleeding: not applicable

## 2013-07-08 NOTE — Anesthesia Postprocedure Evaluation (Signed)
Anesthesia Post Note  Patient: [name]  Procedure(s) Performed: Procedure(s) (LRB): PARS PLANA VITRECTOMY WITH 25 GAUGE (Left) REMOVAL OF INTRAOCULAR LENS (Left) PLACEMENT AND SUTURE OF SECONDARY INTRAOCULAR LENS (Left)  Anesthesia type: MAC  Patient location: PACU  Post pain: Pain level controlled  Post assessment: Patient's Cardiovascular Status Stable  Post vital signs: Reviewed and stable  Level of consciousness: sedated  Complications: No apparent anesthesia complications

## 2013-07-08 NOTE — H&P (Signed)
[name] is an 58 y.o. male.   Chief Complaint: VISION LOSS LEFT EYE. HPI: DISLOCATED INTRAOCULAR LENS, VITREOUS CAVITY WITH APHAKIA LEFT EYE,,   Past Medical History  Diagnosis Date  . Neuropathy   . Diabetes mellitus   . Bipolar affect, depressed   . Hypertension   . Arthritis   . Stroke     Mini stroke about 43yr ago  . Cirrhosis   . Alcohol abuse   . Chronic pain   . Cocaine abuse   . Muscle spasm     both legs  . Encephalopathy, hepatic   . Detached retina   . COPD (chronic obstructive pulmonary disease)     emphysema  . Bronchitis   . Barrett's esophagus   . GERD (gastroesophageal reflux disease)     has ulcer  . Anemia     Past Surgical History  Procedure Laterality Date  . Fracture surgery      Leg and arm 89yrago  . Esophagogastroduodenoscopy  04/04/2012    Procedure: ESOPHAGOGASTRODUODENOSCOPY (EGD);  Surgeon: JoIrene ShipperMD;  [facility];  Service: Endoscopy;  Laterality: N/A;  . Esophagogastroduodenoscopy Left 03/13/2013    Procedure: ESOPHAGOGASTRODUODENOSCOPY (EGD);  Surgeon: WiArta SilenceMD;  [facility];  Service: Endoscopy;  Laterality: Left;  . Marland Kitchenye surgery  8 months ago both eyes    cataracts both eyes, detached eye, gas pocket  . Vasectomy      Family History  Problem Relation Age of Onset  . Hypotension Mother    Social History:  reports that he has been smoking Cigarettes.  He has a 30 pack-year smoking history. He has never used smokeless tobacco. He reports that he drinks alcohol. He reports that he uses illicit drugs (Cocaine).  Allergies: No Known Allergies  Medications Prior to Admission  Medication Sig Dispense Refill  . ALPRAZolam (XANAX) 1 MG tablet Take 1 mg by mouth 3 (three) times daily.       . Marland Kitchenmitriptyline (ELAVIL) 25 MG tablet Take 1 tablet (25 mg total) by mouth at bedtime.  30 tablet  0  . diclofenac sodium (VOLTAREN) 1 % GEL Apply 1 application topically daily as needed (back and leg pain).       . Marland Kitchenscitalopram (LEXAPRO) 10 MG tablet Take 1 tablet (10 mg total) by mouth daily.  30 tablet  0  . folic acid (FOLVITE) 1 MG tablet Take 1 tablet (1 mg total) by mouth daily.  30 tablet  0  . furosemide (LASIX) 40 MG tablet Take 1 tablet (40 mg total) by mouth daily.  30 tablet  0  . HYDROcodone-acetaminophen (NORCO/VICODIN) 5-325 MG per tablet Take 1 tablet by mouth 2 (two) times daily as needed (pain).       . insulin aspart (NOVOLOG) 100 UNIT/ML injection Inject 6 Units into the skin 3 (three) times daily before meals.      . Insulin Glargine (LANTUS SOLOSTAR) 100 UNIT/ML SOPN Inject 46 Units into the skin at bedtime.      . Marland Kitchenisinopril (PRINIVIL,ZESTRIL) 40 MG tablet Take 1 tablet (40 mg total) by mouth daily. For high blood pressure management  30 tablet  0  . Multiple Vitamin (MULTIVITAMIN WITH MINERALS) TABS tablet Take 1 tablet by mouth daily. (Resume with the vitamins that you have at home): For low vitamin  30 tablet  0  . ofloxacin (OCUFLOX) 0.3 % ophthalmic solution Place 1 drop into both eyes 4 (four) times daily.       .Marland Kitchen  pantoprazole (PROTONIX) 40 MG tablet Take 1 tablet (40 mg total) by mouth 2 (two) times daily.  60 tablet  3  . prednisoLONE acetate (PRED FORTE) 1 % ophthalmic suspension Place 1 drop into both eyes 3 (three) times daily.       . propranolol (INDERAL) 20 MG tablet Take 1 tablet (20 mg total) by mouth 2 (two) times daily.  60 tablet  0  . spironolactone (ALDACTONE) 100 MG tablet Take 1 tablet (100 mg total) by mouth daily.  30 tablet  0  . thiamine 100 MG tablet Take 1 tablet (100 mg total) by mouth daily.  30 tablet  0  . lactulose (CHRONULAC) 10 GM/15ML solution Take 30 mLs (20 g total) by mouth 3 (three) times daily.  240 mL  0    Results for orders placed during the hospital encounter of 07/08/13 (from the past 48 hour(s))  CBC     Status: Abnormal   Collection Time    07/08/13  9:55 AM      Result Value Ref Range   WBC 3.9 (*) 4.0 - 10.5 K/uL   RBC 3.80  (*) 4.22 - 5.81 MIL/uL   Hemoglobin 11.9 (*) 13.0 - 17.0 g/dL   HCT 33.9 (*) 39.0 - 52.0 %   MCV 89.2  78.0 - 100.0 fL   MCH 31.3  26.0 - 34.0 pg   MCHC 35.1  30.0 - 36.0 g/dL   RDW 17.9 (*) 11.5 - 15.5 %   Platelets 58 (*) 150 - 400 K/uL   Comment: CONSISTENT WITH PREVIOUS RESULT  COMPREHENSIVE METABOLIC PANEL     Status: Abnormal   Collection Time    07/08/13  9:55 AM      Result Value Ref Range   Sodium 138  137 - 147 mEq/L   Potassium 4.6  3.7 - 5.3 mEq/L   Chloride 101  96 - 112 mEq/L   CO2 24  19 - 32 mEq/L   Glucose, Bld 269 (*) 70 - 99 mg/dL   BUN 21  6 - 23 mg/dL   Creatinine, Ser 1.14  0.50 - 1.35 mg/dL   Calcium 9.0  8.4 - 10.5 mg/dL   Total Protein 7.3  6.0 - 8.3 g/dL   Albumin 3.2 (*) 3.5 - 5.2 g/dL   AST 41 (*) 0 - 37 U/L   ALT 23  0 - 53 U/L   Alkaline Phosphatase 92  39 - 117 U/L   Total Bilirubin 1.8 (*) 0.3 - 1.2 mg/dL   GFR calc non Af Amer 70 (*) >90 mL/min   GFR calc Af Amer 81 (*) >90 mL/min   Comment: (NOTE)     The eGFR has been calculated using the CKD EPI equation.     This calculation has not been validated in all clinical situations.     eGFR's persistently <90 mL/min signify possible Chronic Kidney     Disease.  GLUCOSE, CAPILLARY     Status: Abnormal   Collection Time    07/08/13 10:15 AM      Result Value Ref Range   Glucose-Capillary 239 (*) 70 - 99 mg/dL   No results found.  Review of Systems  Constitutional: Negative.   HENT: Negative.   Eyes: Positive for blurred vision.  Cardiovascular: Negative.   Gastrointestinal: Negative.   Genitourinary: Negative.   Skin: Negative.   Neurological: Negative.   Endo/Heme/Allergies: Negative.     Blood pressure 153/64, pulse 80, temperature 98.1 F (36.7 C), temperature  source Oral, resp. rate 18, height 5' 10"  (1.778 m), weight 105.235 kg (232 lb), SpO2 100.00%. Physical Exam  Vitals reviewed. Constitutional: He is oriented to person, place, and time. He appears well-developed and  well-nourished.  HENT:  Head: Normocephalic and atraumatic.  Eyes: Conjunctivae and EOM are normal. Pupils are equal, round, and reactive to light.  VISION OD:20/60 OS 1/200  DISLOCATED INTRAOCULAR LENS LEFT EYE, VITREOUS CAVITY. Aphakia OS  Neck: Trachea normal and normal range of motion. Neck supple.  Cardiovascular: Normal rate, regular rhythm and normal heart sounds.   Respiratory: Effort normal.  GI: Soft.  Musculoskeletal: Normal range of motion.  Neurological: He is alert and oriented to person, place, and time. He has normal reflexes.  Skin: Skin is warm and dry.  Psychiatric: He has a normal mood and affect. His speech is normal and behavior is normal. Judgment and thought content normal. Cognition and memory are normal.     Assessment/Plan VISION LOSS LEFT EYE, WITH APHAKIA, SECONDARY TO DISLOCATED INTRAOCULAR LENS INTRAVITREAL CAVITY.  PLAN VITRECTOMY WITH REMOVAL OF INTRAOCULAR LENS, AND INSERTION OF AC IOL LEFT EYE NOW.  Martise Waddell A 07/08/2013, 12:20 PM

## 2013-07-08 NOTE — Transfer of Care (Signed)
Immediate Anesthesia Transfer of Care Note  Patient: [name]  Procedure(s) Performed: Procedure(s) with comments: PARS PLANA VITRECTOMY WITH 25 GAUGE (Left) REMOVAL OF INTRAOCULAR LENS (Left) PLACEMENT AND SUTURE OF SECONDARY INTRAOCULAR LENS (Left) - Insertion of Anterior Capsule Intraocular Lens   Patient Location: PACU  Anesthesia Type:MAC  Level of Consciousness: awake, alert , oriented and sedated  Airway & Oxygen Therapy: Patient Spontanous Breathing and Patient connected to nasal cannula oxygen  Post-op Assessment: Report given to PACU RN, Post -op Vital signs reviewed and stable and Patient moving all extremities  Post vital signs: Reviewed and stable  Complications: No apparent anesthesia complications

## 2013-07-08 NOTE — Anesthesia Preprocedure Evaluation (Addendum)
Anesthesia Evaluation  Patient identified by MRN, date of birth, ID band Patient awake    Reviewed: Allergy & Precautions, H&P , NPO status , Patient's Chart, lab work & pertinent test results, reviewed documented beta blocker date and time   History of Anesthesia Complications Negative for: history of anesthetic complications  Airway Mallampati: I TM Distance: >3 FB Neck ROM: Full    Dental  (+) Dental Advisory Given, Poor Dentition   Pulmonary COPD COPD inhaler, Current Smoker,    Pulmonary exam normal       Cardiovascular hypertension, Pt. on home beta blockers     Neuro/Psych PSYCHIATRIC DISORDERS Depression Bipolar Disorder CVA    GI/Hepatic PUD, GERD-  ,(+)     substance abuse  alcohol use and cocaine use,   Endo/Other  diabetes  Renal/GU negative Renal ROS     Musculoskeletal   Abdominal   Peds  Hematology   Anesthesia Other Findings   Reproductive/Obstetrics                         Anesthesia Physical Anesthesia Plan  ASA: III  Anesthesia Plan: MAC   Post-op Pain Management:    Induction: Intravenous  Airway Management Planned: Nasal Cannula and Simple Face Mask  Additional Equipment:   Intra-op Plan:   Post-operative Plan:   Informed Consent: I have reviewed the patients History and Physical, chart, labs and discussed the procedure including the risks, benefits and alternatives for the proposed anesthesia with the patient or authorized representative who has indicated his/her understanding and acceptance.   Dental advisory given  Plan Discussed with: CRNA, Anesthesiologist and Surgeon  Anesthesia Plan Comments:       Anesthesia Quick Evaluation

## 2013-07-09 ENCOUNTER — Ambulatory Visit: Admit: 2013-07-09 | Payer: Self-pay | Admitting: Ophthalmology

## 2013-07-09 NOTE — Op Note (Signed)
NAMEQUENTION, MCNEILL NO.:  [id number]  MEDICAL RECORD NO.:  [id number]  LOCATION:  [facility]                         FACILITY:  [facility]  PHYSICIAN:  Clent Demark. Franci Oshana, M.D.   DATE OF BIRTH:  Nov 09, 1955  DATE OF PROCEDURE:  07/08/2013 DATE OF DISCHARGE:  07/08/2013                              OPERATIVE REPORT   PREOPERATIVE DIAGNOSES: 1. Aphakia, left eye. 2. Dislocated intraocular lens in the vitreous cavity, left eye.  POSTOPERATIVE DIAGNOSES: 1. Aphakia, left eye. 2. Dislocated intraocular lens in the vitreous cavity, left eye.  PROCEDURE: 1. Posterior vitrectomy. 2. Removal of dislocated intraocular lens, posterior implant-vitreous     cavity left eye. 3. Secondary insertion of anterior chamber intraocular lens, left eye.  SURGEON:  Clent Demark. Nykayla Marcelli, M.D.  ANESTHESIA:  Local retrobulbar with monitored anesthesia control.  INDICATION FOR PROCEDURE:  The patient is a 58 year old man, who has profound vision loss in left eye on basis of secondary dislocated intraocular lens, posterior chamber intraocular lens, and all acrylic lens.  This lens was not suitable for refixation.  Thus, this is an attempt to simply move this lens and without __________ anterior chamber intraocular lens.  The patient understands the risks of anesthesia, recurrence of death, loss of the eye from the underlying condition including but not limited to hemorrhage, infection, scarring, need for another surgery, change in vision, loss of vision, progressive disease by intervention.  Appropriate signed consent was obtained.  DESCRIPTION OF PROCEDURE:  The patient was taken to the operating room. Appropriate site selection was confirmed by operating staff. Thereafter, under mild sedation and appropriate monitoring, 2% Xylocaine 5 mL injected retrobulbar with additional 5 mL laterally in the fashion of modified Kirk Ruths.  The left periocular region was sterilely prepped and draped in usual  ophthalmic fashion.  Lid speculum was applied.  A 25- gauge infusion cannula was secured after placement of the infratemporal trocar after I verified visually.  Superior trocars were applied.  Core vitrectomy had been done previously from previous retinal surgery. Intraocular lens had sparse capsule remnants with no support anteriorly. This was freed with vitrectomy instrumentation.  Excellent mobilization of the intraocular lens was obtained.  At this time, the conjunctival peritomy was then fashioned superiorly.  The __________ limbal incision was then fashioned.  The anterior chamber was then entered with an MVR blade and deepened with Viscoat.  Under low infusion pressures 28-gauge __________ was then used to engage the dislocated intraocular lens in the vitreous cavity, and grasped near the optic.  At this time, the lens was brought forward.  The anterior chamber wound was then enlarged with an MVR blade.  This allowed for 20-gauge serrated forceps to be placed transluminal to allow for grasping the optic and externalization of the intraocular lens atraumatically.  Anterior chamber was maintained __________.  Cornea had been protected. At this time, anterior chamber intraocular lens after measurement of the clear cornea of 11 mm and MTA3UO power 13.5, and MTA3UO Alcon lens was then injected with a power 13.5 into the anterior chamber and then rotated into an orientation centered at the 2 o'clock and 8 o'clock position.  The haptics were well  scored __________.  Small peripheral iridectomy was then recreated peripherally at the 5:30 position.  At this time, medially, the limbal incision was closed with interrupted nylons.  Because of the patient's mental status, I elected to put twice as many sutures so as to protect this eye from eye rubbing and/or inadvertent trauma.  At this time, the second look vitrectomy was carried out.  I intended to place further retinopexy peripherally.   However, unfortunately, the laser photocoagulator was now working in the machine.  __________ Augmentin previous chorioretinal scars.  No other changes in retina occurred.  At this time, the instruments removed from the eye.  The conjunctiva superiorly was closed with interrupted 7-0 Vicryl suture.  Wounds were secured.  Normal pressures were sustained in the eye.  Superior trocars were removed and the infusion was then removed.  Subconjunctival and Decadron applied.  Wounds were secured.  Sterile patch and Fox shield applied.  Intraocular pressure had been assessed and found to be adequate.  The patient was taken to the PACU in good and stable condition and the tolerated procedure well without complication.     Clent Demark Tai Skelly, M.D.     GAR/MEDQ  D:  07/08/2013  T:  07/09/2013  Job:  [id number]

## 2013-07-12 ENCOUNTER — Encounter (HOSPITAL_COMMUNITY): Payer: Self-pay | Admitting: Ophthalmology

## 2013-08-02 ENCOUNTER — Emergency Department (HOSPITAL_COMMUNITY)
Admission: EM | Admit: 2013-08-02 | Discharge: 2013-08-02 | Disposition: A | Discharge status: Home / Self Care | Payer: Medicare PPO | Attending: Emergency Medicine | Admitting: Emergency Medicine

## 2013-08-02 ENCOUNTER — Encounter (HOSPITAL_COMMUNITY): Payer: Self-pay | Admitting: Emergency Medicine

## 2013-08-02 DIAGNOSIS — Z794 Long term (current) use of insulin: Secondary | ICD-10-CM | POA: Insufficient documentation

## 2013-08-02 DIAGNOSIS — J40 Bronchitis, not specified as acute or chronic: Secondary | ICD-10-CM | POA: Insufficient documentation

## 2013-08-02 DIAGNOSIS — F17200 Nicotine dependence, unspecified, uncomplicated: Secondary | ICD-10-CM | POA: Insufficient documentation

## 2013-08-02 DIAGNOSIS — F3130 Bipolar disorder, current episode depressed, mild or moderate severity, unspecified: Secondary | ICD-10-CM | POA: Insufficient documentation

## 2013-08-02 DIAGNOSIS — D649 Anemia, unspecified: Secondary | ICD-10-CM | POA: Insufficient documentation

## 2013-08-02 DIAGNOSIS — Z79899 Other long term (current) drug therapy: Secondary | ICD-10-CM | POA: Insufficient documentation

## 2013-08-02 DIAGNOSIS — M79609 Pain in unspecified limb: Secondary | ICD-10-CM | POA: Insufficient documentation

## 2013-08-02 DIAGNOSIS — K219 Gastro-esophageal reflux disease without esophagitis: Secondary | ICD-10-CM | POA: Insufficient documentation

## 2013-08-02 DIAGNOSIS — I10 Essential (primary) hypertension: Secondary | ICD-10-CM | POA: Insufficient documentation

## 2013-08-02 DIAGNOSIS — I6350 Cerebral infarction due to unspecified occlusion or stenosis of unspecified cerebral artery: Secondary | ICD-10-CM | POA: Insufficient documentation

## 2013-08-02 DIAGNOSIS — Z8669 Personal history of other diseases of the nervous system and sense organs: Secondary | ICD-10-CM | POA: Insufficient documentation

## 2013-08-02 DIAGNOSIS — Z792 Long term (current) use of antibiotics: Secondary | ICD-10-CM | POA: Insufficient documentation

## 2013-08-02 DIAGNOSIS — E1149 Type 2 diabetes mellitus with other diabetic neurological complication: Secondary | ICD-10-CM | POA: Insufficient documentation

## 2013-08-02 DIAGNOSIS — J4489 Other specified chronic obstructive pulmonary disease: Secondary | ICD-10-CM | POA: Insufficient documentation

## 2013-08-02 DIAGNOSIS — M79642 Pain in left hand: Secondary | ICD-10-CM

## 2013-08-02 DIAGNOSIS — IMO0002 Reserved for concepts with insufficient information to code with codable children: Secondary | ICD-10-CM | POA: Insufficient documentation

## 2013-08-02 DIAGNOSIS — M129 Arthropathy, unspecified: Secondary | ICD-10-CM | POA: Insufficient documentation

## 2013-08-02 DIAGNOSIS — J449 Chronic obstructive pulmonary disease, unspecified: Secondary | ICD-10-CM | POA: Insufficient documentation

## 2013-08-02 LAB — BASIC METABOLIC PANEL
BUN: 20 mg/dL (ref 6–23)
CALCIUM: 8.8 mg/dL (ref 8.4–10.5)
CO2: 23 mEq/L (ref 19–32)
CREATININE: 1.31 mg/dL (ref 0.50–1.35)
Chloride: 100 mEq/L (ref 96–112)
GFR calc non Af Amer: 59 mL/min — ABNORMAL LOW (ref >90)
GFR, EST AFRICAN AMERICAN: 68 mL/min — AB (ref >90)
Glucose, Bld: 290 mg/dL — ABNORMAL HIGH (ref 70–99)
Potassium: 4.4 mEq/L (ref 3.7–5.3)
Sodium: 135 mEq/L — ABNORMAL LOW (ref 137–147)

## 2013-08-02 LAB — CBC
HEMATOCRIT: 31.2 % — AB (ref 39.0–52.0)
Hemoglobin: 11.1 g/dL — ABNORMAL LOW (ref 13.0–17.0)
MCH: 32 pg (ref 26.0–34.0)
MCHC: 35.6 g/dL (ref 30.0–36.0)
MCV: 89.9 fL (ref 78.0–100.0)
PLATELETS: 38 10*3/uL — AB (ref 150–400)
RBC: 3.47 MIL/uL — AB (ref 4.22–5.81)
RDW: 16.6 % — AB (ref 11.5–15.5)
WBC: 2.4 10*3/uL — ABNORMAL LOW (ref 4.0–10.5)

## 2013-08-02 MED ORDER — TRAMADOL HCL 50 MG PO TABS
50.0000 mg | ORAL_TABLET | Freq: Four times a day (QID) | ORAL | Status: DC | PRN
Start: 2013-08-02 — End: 2013-08-19

## 2013-08-02 NOTE — ED Notes (Addendum)
Pt states hes had bilateral arm pain ever since he was hospitalized here for anemia. He said the arm pain started after he received multiple blood transfusions. He said the doctor told him it was related to his rotator cuffs but he states no one has treated the pain. He states they told him to see a neurologist but he never did.

## 2013-08-02 NOTE — Discharge Instructions (Signed)
Avoid use of Tylenol as it can interfere with your liver. Take the pain medication prescribed instead for pain. Follow up with your primary care physician for medication refills and further treatment. Take your insulin when you get home today.

## 2013-08-02 NOTE — ED Notes (Signed)
Pt ambulatory to restroom with no issues 

## 2013-08-02 NOTE — ED Provider Notes (Addendum)
CSN: [id number]     Arrival date & time 08/02/13  1213 History   First MD Initiated Contact with Patient 08/02/13 1446     Chief Complaint  Patient presents with  . Arm Pain     (Consider location/radiation/quality/duration/timing/severity/associated sxs/prior Treatment) HPI Complains of left hand pain for 3 weeks. He denies other complaint. He treated himself with Tylenol this morning with partial relief. He denies injury denies fever denies other complaint pain is worse with movement of his hand improved with remaining still. He also complains of "cracking in his shoulders" when he moves them for the past several months. He reports that a non medically trained friend told him he had a rotator cuff problem. He denies other complaint. Denies chest pain. Past Medical History  Diagnosis Date  . Neuropathy   . Diabetes mellitus   . Bipolar affect, depressed   . Hypertension   . Arthritis   . Stroke     Mini stroke about 74yrs ago  . Cirrhosis   . Alcohol abuse   . Chronic pain   . Cocaine abuse   . Muscle spasm     both legs  . Encephalopathy, hepatic   . Detached retina   . COPD (chronic obstructive pulmonary disease)     emphysema  . Bronchitis   . Barrett's esophagus   . GERD (gastroesophageal reflux disease)     has ulcer  . Anemia    Past Surgical History  Procedure Laterality Date  . Fracture surgery      Leg and arm 56yrs ago  . Esophagogastroduodenoscopy  04/04/2012    Procedure: ESOPHAGOGASTRODUODENOSCOPY (EGD);  Surgeon: Irene Shipper, MD;  [facility]: [facility];  Service: Endoscopy;  Laterality: N/A;  . Esophagogastroduodenoscopy Left 03/13/2013    Procedure: ESOPHAGOGASTRODUODENOSCOPY (EGD);  Surgeon: Arta Silence, MD;  Location: [facility]COPY;  Service: Endoscopy;  Laterality: Left;  Marland Kitchen Eye surgery  8 months ago both eyes    cataracts both eyes, detached eye, gas pocket  . Vasectomy    . Pars plana vitrectomy Left 07/08/2013    Procedure: PARS PLANA VITRECTOMY  WITH 25 GAUGE;  Surgeon: Hurman Horn, MD;  Location: [place];  Service: Ophthalmology;  Laterality: Left;  . Intraocular lens removal Left 07/08/2013    Procedure: REMOVAL OF INTRAOCULAR LENS;  Surgeon: Hurman Horn, MD;  Location: [place];  Service: Ophthalmology;  Laterality: Left;  . Placement and suture of secondary intraocular lens Left 07/08/2013    Procedure: PLACEMENT AND SUTURE OF SECONDARY INTRAOCULAR LENS;  Surgeon: Hurman Horn, MD;  Location: [place];  Service: Ophthalmology;  Laterality: Left;  Insertion of Anterior Capsule Intraocular Lens    Family History  Problem Relation Age of Onset  . Hypotension Mother    History  Substance Use Topics  . Smoking status: Current Every Day Smoker -- 1.00 packs/day for 30 years    Types: Cigarettes    Last Attempt to Quit: 04/06/2012  . Smokeless tobacco: Never Used     Comment: quit   . Alcohol Use: 0.0 oz/week     Comment: 12 pk beer daily  06/2013 - no alcohol since 11/2012    Review of Systems  Constitutional: Negative.   HENT: Negative.   Respiratory: Negative.   Cardiovascular: Negative.   Gastrointestinal: Negative.   Musculoskeletal: Positive for arthralgias.  Skin: Negative.   Neurological: Negative.   Psychiatric/Behavioral: Negative.       Allergies  Review of patient's allergies indicates no known allergies.  Home Medications   Current Outpatient Rx  Name  Route  Sig  Dispense  Refill  . ALPRAZolam (XANAX) 1 MG tablet   Oral   Take 1 mg by mouth 3 (three) times daily.          Marland Kitchen amitriptyline (ELAVIL) 25 MG tablet   Oral   Take 1 tablet (25 mg total) by mouth at bedtime.   30 tablet   0   . diclofenac sodium (VOLTAREN) 1 % GEL   Topical   Apply 1 application topically daily as needed (back and leg pain).         Marland Kitchen escitalopram (LEXAPRO) 10 MG tablet   Oral   Take 1 tablet (10 mg total) by mouth daily.   30 tablet   0   . folic acid (FOLVITE) 1 MG tablet   Oral   Take 1 tablet (1 mg total) by  mouth daily.   30 tablet   0   . furosemide (LASIX) 40 MG tablet   Oral   Take 1 tablet (40 mg total) by mouth daily.   30 tablet   0   . HYDROcodone-acetaminophen (NORCO/VICODIN) 5-325 MG per tablet   Oral   Take 1 tablet by mouth 2 (two) times daily as needed (pain).          . insulin aspart (NOVOLOG) 100 UNIT/ML injection   Subcutaneous   Inject 6 Units into the skin 3 (three) times daily before meals.         . Insulin Glargine (LANTUS SOLOSTAR) 100 UNIT/ML SOPN   Subcutaneous   Inject 46 Units into the skin at bedtime.         Marland Kitchen lisinopril (PRINIVIL,ZESTRIL) 40 MG tablet   Oral   Take 1 tablet (40 mg total) by mouth daily. For high blood pressure management   30 tablet   0   . Multiple Vitamin (MULTIVITAMIN WITH MINERALS) TABS tablet   Oral   Take 1 tablet by mouth daily. (Resume with the vitamins that you have at home): For low vitamin   30 tablet   0   . ofloxacin (OCUFLOX) 0.3 % ophthalmic solution   Both Eyes   Place 1 drop into both eyes 4 (four) times daily.          . pantoprazole (PROTONIX) 40 MG tablet   Oral   Take 1 tablet (40 mg total) by mouth 2 (two) times daily.   60 tablet   3   . prednisoLONE acetate (PRED FORTE) 1 % ophthalmic suspension   Both Eyes   Place 1 drop into both eyes 3 (three) times daily.          . propranolol (INDERAL) 20 MG tablet   Oral   Take 1 tablet (20 mg total) by mouth 2 (two) times daily.   60 tablet   0   . spironolactone (ALDACTONE) 100 MG tablet   Oral   Take 1 tablet (100 mg total) by mouth daily.   30 tablet   0   . thiamine 100 MG tablet   Oral   Take 1 tablet (100 mg total) by mouth daily.   30 tablet   0    BP 103/53  Pulse 72  Temp(Src) 97.9 F (36.6 C) (Oral)  Resp 18  Ht 5\' 11"  (1.803 m)  Wt 216 lb (97.977 kg)  BMI 30.14 kg/m2  SpO2 97% Physical Exam  Nursing note and vitals reviewed. Constitutional:  Chronically ill-appearing  HENT:  Head: Normocephalic and  atraumatic.  Eyes: Conjunctivae are normal. Pupils are equal, round, and reactive to light.  Neck: Neck supple. No tracheal deviation present. No thyromegaly present.  Cardiovascular: Normal rate and regular rhythm.   No murmur heard. Pulmonary/Chest: Effort normal and breath sounds normal.  Abdominal: Soft. Bowel sounds are normal. He exhibits no distension. There is no tenderness.  Musculoskeletal: Normal range of motion. He exhibits no edema and no tenderness.  Extremities without redness swelling or tenderness neurovascularly intact.  Neurological: He is alert. Coordination normal.  Skin: Skin is warm and dry. No rash noted.  Psychiatric: He has a normal mood and affect.    ED Course  Procedures (including critical care time) Labs Review Labs Reviewed  CBC - Abnormal; Notable for the following:    WBC 2.4 (*)    RBC 3.47 (*)    Hemoglobin 11.1 (*)    HCT 31.2 (*)    RDW 16.6 (*)    Platelets 38 (*)    All other components within normal limits  BASIC METABOLIC PANEL - Abnormal; Notable for the following:    Sodium 135 (*)    Glucose, Bld 290 (*)    GFR calc non Af Amer 59 (*)    GFR calc Af Amer 68 (*)    All other components within normal limits   Imaging Review No results found.   EKG Interpretation None     Results for orders placed during the hospital encounter of 08/02/13  CBC      Result Value Ref Range   WBC 2.4 (*) 4.0 - 10.5 K/uL   RBC 3.47 (*) 4.22 - 5.81 MIL/uL   Hemoglobin 11.1 (*) 13.0 - 17.0 g/dL   HCT 31.2 (*) 39.0 - 52.0 %   MCV 89.9  78.0 - 100.0 fL   MCH 32.0  26.0 - 34.0 pg   MCHC 35.6  30.0 - 36.0 g/dL   RDW 16.6 (*) 11.5 - 15.5 %   Platelets 38 (*) 150 - 400 K/uL  BASIC METABOLIC PANEL      Result Value Ref Range   Sodium 135 (*) 137 - 147 mEq/L   Potassium 4.4  3.7 - 5.3 mEq/L   Chloride 100  96 - 112 mEq/L   CO2 23  19 - 32 mEq/L   Glucose, Bld 290 (*) 70 - 99 mg/dL   BUN 20  6 - 23 mg/dL   Creatinine, Ser 1.31  0.50 - 1.35 mg/dL    Calcium 8.8  8.4 - 10.5 mg/dL   GFR calc non Af Amer 59 (*) >90 mL/min   GFR calc Af Amer 68 (*) >90 mL/min   No results found.   MDM   Final diagnoses:  None   thrombocytopenia is chronic. Anemia is chronic. Patient hyperglycemia but states he's not taking his insulin yet today. In light of history of cirrhosis and peptic ulcer disease Will prescribe tramadol. He is instructed to followup with Dr.Garba Dx #1pain in left hand. #2 thrombocytopenia #3 anemia #4 hyperglycemia     Orlie Dakin, MD 08/02/13 [place], MD 08/02/13 1544

## 2013-08-06 ENCOUNTER — Encounter (HOSPITAL_COMMUNITY): Payer: Self-pay | Admitting: Emergency Medicine

## 2013-08-06 ENCOUNTER — Emergency Department (HOSPITAL_COMMUNITY)
Admission: EM | Admit: 2013-08-06 | Discharge: 2013-08-06 | Discharge status: Against Medical Advice | Payer: Medicare PPO | Attending: Emergency Medicine | Admitting: Emergency Medicine

## 2013-08-06 DIAGNOSIS — J4489 Other specified chronic obstructive pulmonary disease: Secondary | ICD-10-CM | POA: Insufficient documentation

## 2013-08-06 DIAGNOSIS — J449 Chronic obstructive pulmonary disease, unspecified: Secondary | ICD-10-CM | POA: Insufficient documentation

## 2013-08-06 DIAGNOSIS — E119 Type 2 diabetes mellitus without complications: Secondary | ICD-10-CM | POA: Insufficient documentation

## 2013-08-06 DIAGNOSIS — I10 Essential (primary) hypertension: Secondary | ICD-10-CM | POA: Insufficient documentation

## 2013-08-06 DIAGNOSIS — F17200 Nicotine dependence, unspecified, uncomplicated: Secondary | ICD-10-CM | POA: Insufficient documentation

## 2013-08-06 DIAGNOSIS — G8929 Other chronic pain: Secondary | ICD-10-CM | POA: Insufficient documentation

## 2013-08-06 DIAGNOSIS — IMO0001 Reserved for inherently not codable concepts without codable children: Secondary | ICD-10-CM | POA: Insufficient documentation

## 2013-08-06 NOTE — ED Notes (Signed)
Pt states he has pain all over for the past week. Pt states he has hx of bleeding ulcer. Pt denies rectal bleeding. Pt denies nausea vomiting. Pt reports that he has just not been feeling well. Pt alert, no acute distress. Skin warm and dry.

## 2013-08-06 NOTE — ED Notes (Signed)
Pt called several times for labs. No answer.

## 2013-08-07 ENCOUNTER — Ambulatory Visit: Payer: Self-pay | Admitting: *Deleted

## 2013-08-09 ENCOUNTER — Encounter (HOSPITAL_COMMUNITY): Payer: Self-pay | Admitting: Emergency Medicine

## 2013-08-09 ENCOUNTER — Observation Stay (HOSPITAL_COMMUNITY)
Admission: EM | Admit: 2013-08-09 | Discharge: 2013-08-12 | Disposition: A | Discharge status: Home / Self Care | Payer: Medicare PPO | Attending: Internal Medicine | Admitting: Internal Medicine

## 2013-08-09 ENCOUNTER — Inpatient Hospital Stay (HOSPITAL_COMMUNITY): Payer: Medicare PPO

## 2013-08-09 DIAGNOSIS — K7030 Alcoholic cirrhosis of liver without ascites: Secondary | ICD-10-CM | POA: Insufficient documentation

## 2013-08-09 DIAGNOSIS — K7682 Hepatic encephalopathy: Principal | ICD-10-CM

## 2013-08-09 DIAGNOSIS — D61818 Other pancytopenia: Secondary | ICD-10-CM

## 2013-08-09 DIAGNOSIS — Z8673 Personal history of transient ischemic attack (TIA), and cerebral infarction without residual deficits: Secondary | ICD-10-CM | POA: Insufficient documentation

## 2013-08-09 DIAGNOSIS — M129 Arthropathy, unspecified: Secondary | ICD-10-CM | POA: Insufficient documentation

## 2013-08-09 DIAGNOSIS — M25519 Pain in unspecified shoulder: Secondary | ICD-10-CM | POA: Insufficient documentation

## 2013-08-09 DIAGNOSIS — R161 Splenomegaly, not elsewhere classified: Secondary | ICD-10-CM | POA: Insufficient documentation

## 2013-08-09 DIAGNOSIS — F17200 Nicotine dependence, unspecified, uncomplicated: Secondary | ICD-10-CM | POA: Insufficient documentation

## 2013-08-09 DIAGNOSIS — Z79899 Other long term (current) drug therapy: Secondary | ICD-10-CM | POA: Insufficient documentation

## 2013-08-09 DIAGNOSIS — D696 Thrombocytopenia, unspecified: Secondary | ICD-10-CM

## 2013-08-09 DIAGNOSIS — Z794 Long term (current) use of insulin: Secondary | ICD-10-CM | POA: Insufficient documentation

## 2013-08-09 DIAGNOSIS — I10 Essential (primary) hypertension: Secondary | ICD-10-CM

## 2013-08-09 DIAGNOSIS — E119 Type 2 diabetes mellitus without complications: Secondary | ICD-10-CM

## 2013-08-09 DIAGNOSIS — J449 Chronic obstructive pulmonary disease, unspecified: Secondary | ICD-10-CM | POA: Insufficient documentation

## 2013-08-09 DIAGNOSIS — R296 Repeated falls: Secondary | ICD-10-CM | POA: Insufficient documentation

## 2013-08-09 DIAGNOSIS — K219 Gastro-esophageal reflux disease without esophagitis: Secondary | ICD-10-CM | POA: Insufficient documentation

## 2013-08-09 DIAGNOSIS — F1010 Alcohol abuse, uncomplicated: Secondary | ICD-10-CM

## 2013-08-09 DIAGNOSIS — J4489 Other specified chronic obstructive pulmonary disease: Secondary | ICD-10-CM | POA: Insufficient documentation

## 2013-08-09 DIAGNOSIS — K7460 Unspecified cirrhosis of liver: Secondary | ICD-10-CM

## 2013-08-09 DIAGNOSIS — F1020 Alcohol dependence, uncomplicated: Secondary | ICD-10-CM | POA: Insufficient documentation

## 2013-08-09 DIAGNOSIS — K7290 Hepatic failure, unspecified without coma: Principal | ICD-10-CM | POA: Diagnosis present

## 2013-08-09 DIAGNOSIS — E7220 Disorder of urea cycle metabolism, unspecified: Secondary | ICD-10-CM

## 2013-08-09 LAB — URINALYSIS, ROUTINE W REFLEX MICROSCOPIC
BILIRUBIN URINE: NEGATIVE
Glucose, UA: 500 mg/dL — AB
HGB URINE DIPSTICK: NEGATIVE
Ketones, ur: NEGATIVE mg/dL
Leukocytes, UA: NEGATIVE
NITRITE: NEGATIVE
PROTEIN: NEGATIVE mg/dL
Specific Gravity, Urine: 1.012 (ref 1.005–1.030)
UROBILINOGEN UA: 2 mg/dL — AB (ref 0.0–1.0)
pH: 6 (ref 5.0–8.0)

## 2013-08-09 LAB — AMMONIA: Ammonia: 121 umol/L — ABNORMAL HIGH (ref 11–60)

## 2013-08-09 LAB — RAPID URINE DRUG SCREEN, HOSP PERFORMED
AMPHETAMINES: NOT DETECTED
Barbiturates: POSITIVE — AB
Benzodiazepines: POSITIVE — AB
Cocaine: NOT DETECTED
Opiates: NOT DETECTED
TETRAHYDROCANNABINOL: NOT DETECTED

## 2013-08-09 LAB — COMPREHENSIVE METABOLIC PANEL
ALT: 22 U/L (ref 0–53)
AST: 43 U/L — AB (ref 0–37)
Albumin: 3.3 g/dL — ABNORMAL LOW (ref 3.5–5.2)
Alkaline Phosphatase: 91 U/L (ref 39–117)
BUN: 23 mg/dL (ref 6–23)
CALCIUM: 9.8 mg/dL (ref 8.4–10.5)
CO2: 22 mEq/L (ref 19–32)
Chloride: 100 mEq/L (ref 96–112)
Creatinine, Ser: 1.26 mg/dL (ref 0.50–1.35)
GFR calc non Af Amer: 62 mL/min — ABNORMAL LOW (ref >90)
GFR, EST AFRICAN AMERICAN: 72 mL/min — AB (ref >90)
GLUCOSE: 197 mg/dL — AB (ref 70–99)
Potassium: 4.8 mEq/L (ref 3.7–5.3)
Sodium: 136 mEq/L — ABNORMAL LOW (ref 137–147)
TOTAL PROTEIN: 7.1 g/dL (ref 6.0–8.3)
Total Bilirubin: 2.3 mg/dL — ABNORMAL HIGH (ref 0.3–1.2)

## 2013-08-09 LAB — CBC
HCT: 35.3 % — ABNORMAL LOW (ref 39.0–52.0)
Hemoglobin: 12.5 g/dL — ABNORMAL LOW (ref 13.0–17.0)
MCH: 31.7 pg (ref 26.0–34.0)
MCHC: 35.4 g/dL (ref 30.0–36.0)
MCV: 89.6 fL (ref 78.0–100.0)
PLATELETS: 65 10*3/uL — AB (ref 150–400)
RBC: 3.94 MIL/uL — ABNORMAL LOW (ref 4.22–5.81)
RDW: 16.2 % — ABNORMAL HIGH (ref 11.5–15.5)
WBC: 4.6 10*3/uL (ref 4.0–10.5)

## 2013-08-09 LAB — CBG MONITORING, ED: GLUCOSE-CAPILLARY: 195 mg/dL — AB (ref 70–99)

## 2013-08-09 LAB — ETHANOL: Alcohol, Ethyl (B): <11 mg/dL (ref 0–11)

## 2013-08-09 MED ORDER — SPIRONOLACTONE 100 MG PO TABS
100.0000 mg | ORAL_TABLET | Freq: Every day | ORAL | Status: DC
  Administered 2013-08-10: 100 mg via ORAL
  Filled 2013-08-09 (×2): qty 1

## 2013-08-09 MED ORDER — ESCITALOPRAM OXALATE 10 MG PO TABS
10.0000 mg | ORAL_TABLET | Freq: Every day | ORAL | Status: DC
  Administered 2013-08-09 – 2013-08-12 (×4): 10 mg via ORAL
  Filled 2013-08-09 (×4): qty 1

## 2013-08-09 MED ORDER — RIFAXIMIN 550 MG PO TABS
550.0000 mg | ORAL_TABLET | Freq: Two times a day (BID) | ORAL | Status: DC
  Administered 2013-08-09 – 2013-08-12 (×6): 550 mg via ORAL
  Filled 2013-08-09 (×7): qty 1

## 2013-08-09 MED ORDER — PANTOPRAZOLE SODIUM 40 MG PO TBEC
40.0000 mg | DELAYED_RELEASE_TABLET | Freq: Two times a day (BID) | ORAL | Status: DC
  Administered 2013-08-09 – 2013-08-12 (×6): 40 mg via ORAL
  Filled 2013-08-09 (×9): qty 1

## 2013-08-09 MED ORDER — LACTULOSE 10 GM/15ML PO SOLN
30.0000 g | Freq: Three times a day (TID) | ORAL | Status: DC
  Administered 2013-08-09 – 2013-08-11 (×4): 30 g via ORAL
  Filled 2013-08-09 (×7): qty 45

## 2013-08-09 MED ORDER — TRAMADOL HCL 50 MG PO TABS
50.0000 mg | ORAL_TABLET | Freq: Four times a day (QID) | ORAL | Status: DC | PRN
  Administered 2013-08-10 – 2013-08-12 (×3): 50 mg via ORAL
  Filled 2013-08-09 (×3): qty 1

## 2013-08-09 MED ORDER — AMITRIPTYLINE HCL 25 MG PO TABS
25.0000 mg | ORAL_TABLET | Freq: Every day | ORAL | Status: DC
  Administered 2013-08-09 – 2013-08-10 (×2): 25 mg via ORAL
  Filled 2013-08-09 (×3): qty 1

## 2013-08-09 MED ORDER — PROPRANOLOL HCL 20 MG PO TABS
20.0000 mg | ORAL_TABLET | Freq: Two times a day (BID) | ORAL | Status: DC
  Administered 2013-08-09 – 2013-08-12 (×5): 20 mg via ORAL
  Filled 2013-08-09 (×7): qty 1

## 2013-08-09 MED ORDER — INSULIN ASPART 100 UNIT/ML ~~LOC~~ SOLN
12.0000 [IU] | Freq: Three times a day (TID) | SUBCUTANEOUS | Status: DC
  Administered 2013-08-10 (×3): 12 [IU] via SUBCUTANEOUS

## 2013-08-09 MED ORDER — INSULIN GLARGINE 100 UNIT/ML ~~LOC~~ SOLN
45.0000 [IU] | Freq: Every day | SUBCUTANEOUS | Status: DC
  Administered 2013-08-09 – 2013-08-11 (×3): 45 [IU] via SUBCUTANEOUS
  Filled 2013-08-09 (×4): qty 0.45

## 2013-08-09 MED ORDER — HYDROCODONE-ACETAMINOPHEN 5-325 MG PO TABS
1.0000 | ORAL_TABLET | Freq: Two times a day (BID) | ORAL | Status: DC | PRN
  Administered 2013-08-10 – 2013-08-11 (×3): 1 via ORAL
  Filled 2013-08-09 (×3): qty 1

## 2013-08-09 MED ORDER — VITAMIN B-1 100 MG PO TABS
100.0000 mg | ORAL_TABLET | Freq: Every day | ORAL | Status: DC
  Administered 2013-08-09 – 2013-08-12 (×4): 100 mg via ORAL
  Filled 2013-08-09 (×4): qty 1

## 2013-08-09 MED ORDER — ALPRAZOLAM 1 MG PO TABS
1.0000 mg | ORAL_TABLET | Freq: Three times a day (TID) | ORAL | Status: DC
  Administered 2013-08-09 – 2013-08-10 (×2): 1 mg via ORAL
  Filled 2013-08-09: qty 2
  Filled 2013-08-09: qty 1

## 2013-08-09 MED ORDER — DICLOFENAC SODIUM 1 % TD GEL: 1.0000 "application " | Freq: Every day | TRANSDERMAL | Status: DC | PRN

## 2013-08-09 MED ORDER — FOLIC ACID 1 MG PO TABS
1.0000 mg | ORAL_TABLET | Freq: Every day | ORAL | Status: DC
  Administered 2013-08-09 – 2013-08-12 (×4): 1 mg via ORAL
  Filled 2013-08-09 (×4): qty 1

## 2013-08-09 MED ORDER — LISINOPRIL 20 MG PO TABS
20.0000 mg | ORAL_TABLET | Freq: Every day | ORAL | Status: DC
  Administered 2013-08-10: 20 mg via ORAL
  Filled 2013-08-09 (×2): qty 1

## 2013-08-09 NOTE — ED Notes (Signed)
Pt presents via EMS with c/o "not acting right". Pt was at [facility] for a follow-up and they called EMS reference pt was altered. Per facility, they were not comfortable putting patient back on the bus. [name] blood sugar was 297 but per EMS pt is alert but possible behavioral issue. Pt says he is not really sure why he is here either.

## 2013-08-09 NOTE — ED Notes (Signed)
Pt sent from [place] PCP. Seen in office today for f/u, not sure what for. Pt sts he is low on blood and plasma and ended up here from Drs. Upon more questioning, pt reports feeling foggy headed for past 3 weeks, feels as though confusion is worsening, hard to sleep. Does not know day of week, month, year, only knows president.

## 2013-08-09 NOTE — ED Notes (Signed)
Pt attempted to use urinal, pt urinated on floor

## 2013-08-09 NOTE — ED Provider Notes (Signed)
Medical screening examination/treatment/procedure(s) were performed by non-physician practitioner and as supervising physician I was immediately available for consultation/collaboration.  Richarda Blade, MD 08/09/13 [phone]

## 2013-08-09 NOTE — H&P (Signed)
[facility] History and Physical  [name] YWV:[id number] DOB: 1955/12/11 DOA: 08/09/2013  Referring physician: EDP PCP: Barbette Merino, MD   Chief Complaint: AMS   HPI: [name] is a 58 y.o. male admitted numerous times previously for hyperammonemia and hepatic encephalopathy due to EtOH cirrhosis.  He presents from his PCPs office with AMS.  He was noted to be confused during his appointment today and was not oriented to time.  Patient isnt really sure why he is here, he has some generalized soreness but mostly states he is hungry.  Review of his home meds reveals that lactulose is strangely absent from the list.  When I asked the patient about it, he correctly described the lactulose liquid to me and said that no he hadn't been taking it at home recently "because his doctor took him off of it".  Review of Systems: Systems reviewed.  As above, otherwise negative  Past Medical History  Diagnosis Date  . Neuropathy   . Diabetes mellitus   . Bipolar affect, depressed   . Hypertension   . Arthritis   . Stroke     Mini stroke about 61yrs ago  . Cirrhosis   . Alcohol abuse   . Chronic pain   . Cocaine abuse   . Muscle spasm     both legs  . Encephalopathy, hepatic   . Detached retina   . COPD (chronic obstructive pulmonary disease)     emphysema  . Bronchitis   . Barrett's esophagus   . GERD (gastroesophageal reflux disease)     has ulcer  . Anemia    Past Surgical History  Procedure Laterality Date  . Fracture surgery      Leg and arm 38yrs ago  . Esophagogastroduodenoscopy  04/04/2012    Procedure: ESOPHAGOGASTRODUODENOSCOPY (EGD);  Surgeon: Irene Shipper, MD;  Location: [facility] ENDOSCOPY;  Service: Endoscopy;  Laterality: N/A;  . Esophagogastroduodenoscopy Left 03/13/2013    Procedure: ESOPHAGOGASTRODUODENOSCOPY (EGD);  Surgeon: Arta Silence, MD;  Location: [facility] ENDOSCOPY;  Service: Endoscopy;  Laterality: Left;  Marland Kitchen Eye surgery  8 months ago both eyes    cataracts  both eyes, detached eye, gas pocket  . Vasectomy    . Pars plana vitrectomy Left 07/08/2013    Procedure: PARS PLANA VITRECTOMY WITH 25 GAUGE;  Surgeon: Hurman Horn, MD;  Location: [place];  Service: Ophthalmology;  Laterality: Left;  . Intraocular lens removal Left 07/08/2013    Procedure: REMOVAL OF INTRAOCULAR LENS;  Surgeon: Hurman Horn, MD;  Location: [place];  Service: Ophthalmology;  Laterality: Left;  . Placement and suture of secondary intraocular lens Left 07/08/2013    Procedure: PLACEMENT AND SUTURE OF SECONDARY INTRAOCULAR LENS;  Surgeon: Hurman Horn, MD;  Location: [place];  Service: Ophthalmology;  Laterality: Left;  Insertion of Anterior Capsule Intraocular Lens    Social History:  reports that he has been smoking Cigarettes.  He has a 30 pack-year smoking history. He has never used smokeless tobacco. He reports that he drinks alcohol. He reports that he uses illicit drugs (Cocaine).  No Known Allergies  Family History  Problem Relation Age of Onset  . Hypotension Mother      Prior to Admission medications   Medication Sig Start Date End Date Taking? Authorizing Provider  ALPRAZolam Duanne Moron) 1 MG tablet Take 1 mg by mouth 3 (three) times daily.  03/29/13  Yes Historical Provider, MD  amitriptyline (ELAVIL) 25 MG tablet Take 1 tablet (25 mg total) by  mouth at bedtime. 04/12/13  Yes Janece Canterbury, MD  diclofenac sodium (VOLTAREN) 1 % GEL Apply 1 application topically daily as needed (back and leg pain).   Yes Historical Provider, MD  escitalopram (LEXAPRO) 10 MG tablet Take 1 tablet (10 mg total) by mouth daily. 04/12/13  Yes Janece Canterbury, MD  folic acid (FOLVITE) 1 MG tablet Take 1 tablet (1 mg total) by mouth daily. 04/12/13  Yes Janece Canterbury, MD  HYDROcodone-acetaminophen (NORCO/VICODIN) 5-325 MG per tablet Take 1 tablet by mouth 2 (two) times daily as needed (pain).    Yes Historical Provider, MD  insulin aspart (NOVOLOG) 100 UNIT/ML injection Inject 12 Units into the  skin 3 (three) times daily before meals.    Yes Historical Provider, MD  Insulin Glargine (LANTUS SOLOSTAR) 100 UNIT/ML SOPN Inject 45 Units into the skin at bedtime.    Yes Historical Provider, MD  lisinopril (PRINIVIL,ZESTRIL) 20 MG tablet Take 20 mg by mouth daily.   Yes Historical Provider, MD  pantoprazole (PROTONIX) 40 MG tablet Take 1 tablet (40 mg total) by mouth 2 (two) times daily. 03/14/13  Yes Bonnielee Haff, MD  propranolol (INDERAL) 20 MG tablet Take 1 tablet (20 mg total) by mouth 2 (two) times daily. 04/12/13  Yes Janece Canterbury, MD  rifaximin (XIFAXAN) 550 MG TABS tablet Take 550 mg by mouth 2 (two) times daily.   Yes Historical Provider, MD  spironolactone (ALDACTONE) 100 MG tablet Take 1 tablet (100 mg total) by mouth daily. 04/02/13  Yes Nishant Dhungel, MD  thiamine 100 MG tablet Take 1 tablet (100 mg total) by mouth daily. 04/12/13  Yes Janece Canterbury, MD  traMADol (ULTRAM) 50 MG tablet Take 1 tablet (50 mg total) by mouth every 6 (six) hours as needed. 08/02/13   Orlie Dakin, MD   Physical Exam: Filed Vitals:   08/09/13 1900  BP: 111/59  Pulse: 63  Temp:   Resp: 16    BP 111/59  Pulse 63  Temp(Src) 98.1 F (36.7 C) (Oral)  Resp 16  SpO2 99%  General Appearance:    Alert, not oriented to date, no distress, appears stated age  Head:    Normocephalic, atraumatic  Eyes:    PERRL, EOMI, sclera non-icteric        Nose:   Nares without drainage or epistaxis. Mucosa, turbinates normal  Throat:   Moist mucous membranes. Oropharynx without erythema or exudate.  Neck:   Supple. No carotid bruits.  No thyromegaly.  No lymphadenopathy.   Back:     No CVA tenderness, no spinal tenderness  Lungs:     Clear to auscultation bilaterally, without wheezes, rhonchi or rales  Chest wall:    No tenderness to palpitation  Heart:    Regular rate and rhythm without murmurs, gallops, rubs  Abdomen:     Soft, non-tender, nondistended, normal bowel sounds, no organomegaly   Genitalia:    deferred  Rectal:    deferred  Extremities:   Pain with movement of L shoulder  Pulses:   2+ and symmetric all extremities  Skin:   Skin color, texture, turgor normal, no rashes or lesions  Lymph nodes:   Cervical, supraclavicular, and axillary nodes normal  Neurologic:   CNII-XII intact. Normal strength, sensation and reflexes      throughout    Labs on Admission:  Basic Metabolic Panel:  Recent Labs Lab 08/09/13 1830  NA 136*  K 4.8  CL 100  CO2 22  GLUCOSE 197*  BUN 23  CREATININE  1.26  CALCIUM 9.8   Liver Function Tests:  Recent Labs Lab 08/09/13 1830  AST 43*  ALT 22  ALKPHOS 91  BILITOT 2.3*  PROT 7.1  ALBUMIN 3.3*   No results found for this basename: LIPASE, AMYLASE,  in the last 168 hours  Recent Labs Lab 08/09/13 1915  AMMONIA 121*   CBC:  Recent Labs Lab 08/09/13 1830  WBC 4.6  HGB 12.5*  HCT 35.3*  MCV 89.6  PLT 65*   Cardiac Enzymes: No results found for this basename: CKTOTAL, CKMB, CKMBINDEX, TROPONINI,  in the last 168 hours  BNP (last 3 results) No results found for this basename: PROBNP,  in the last 8760 hours CBG:  Recent Labs Lab 08/09/13 1831  GLUCAP 195*    Radiological Exams on Admission: No results found.  EKG: Independently reviewed.  Assessment/Plan Principal Problem:   Hepatic encephalopathy Active Problems:   Diabetes mellitus   Thrombocytopenia   Cirrhosis   1. Hepatic encephalopathy - due to not taking lactulose at home, will restart 30gm TID lactulose that he had been on previously and has worked in the past (most recently during November admission). 2. DM2 - continue home meds, CBG checks AC/HS 3. Cirrhosis of liver - chronic and i suspect this remains stable, just that not taking his lactulose has caused him to decompensate. 4. Thrombocytopenia - chronic due to #3 5. Left shoulder pain - musculoskeletal as it is TTP and occurs only with movement, will X ray shoulder.    Code  Status: Full  Family Communication: No family in room Disposition Plan: Admit to inpatient   Time spent: 82 min  Rawn Quiroa M. [facility] Pager [phone]  If 7AM-7PM, please contact the day team taking care of the patient [email or web address] Password [email or web address] 08/09/2013, 10:14 PM

## 2013-08-09 NOTE — ED Provider Notes (Signed)
CSN: [id number]     Arrival date & time 08/09/13  1732 History   First MD Initiated Contact with Patient 08/09/13 1827     Chief Complaint  Patient presents with  . Hyperglycemia  . Altered Mental Status     (Consider location/radiation/quality/duration/timing/severity/associated sxs/prior Treatment) HPI Comments: Patient with history of diabetes, cirrhosis 2/2 alcoholism, substance abuse, history of hyperammonemia, stroke -- presents from PCP with altered mental status. Patient was confused during an appointment today. He was not oriented to time. Patient currently does not know why he is here. His only complaint is generalized soreness of his body and left shoulder pain which has been going on for weeks. The onset of this condition was gradual. The course is gradually worsening. Aggravating factors: none. Alleviating factors: none.    The history is provided by the patient and medical records.    Past Medical History  Diagnosis Date  . Neuropathy   . Diabetes mellitus   . Bipolar affect, depressed   . Hypertension   . Arthritis   . Stroke     Mini stroke about 54yrs ago  . Cirrhosis   . Alcohol abuse   . Chronic pain   . Cocaine abuse   . Muscle spasm     both legs  . Encephalopathy, hepatic   . Detached retina   . COPD (chronic obstructive pulmonary disease)     emphysema  . Bronchitis   . Barrett's esophagus   . GERD (gastroesophageal reflux disease)     has ulcer  . Anemia    Past Surgical History  Procedure Laterality Date  . Fracture surgery      Leg and arm 68yrs ago  . Esophagogastroduodenoscopy  04/04/2012    Procedure: ESOPHAGOGASTRODUODENOSCOPY (EGD);  Surgeon: Irene Shipper, MD;  Location: [facility] ENDOSCOPY;  Service: Endoscopy;  Laterality: N/A;  . Esophagogastroduodenoscopy Left 03/13/2013    Procedure: ESOPHAGOGASTRODUODENOSCOPY (EGD);  Surgeon: Arta Silence, MD;  Location: [facility] ENDOSCOPY;  Service: Endoscopy;  Laterality: Left;  Marland Kitchen Eye surgery  8 months ago  both eyes    cataracts both eyes, detached eye, gas pocket  . Vasectomy    . Pars plana vitrectomy Left 07/08/2013    Procedure: PARS PLANA VITRECTOMY WITH 25 GAUGE;  Surgeon: Hurman Horn, MD;  Location: [place];  Service: Ophthalmology;  Laterality: Left;  . Intraocular lens removal Left 07/08/2013    Procedure: REMOVAL OF INTRAOCULAR LENS;  Surgeon: Hurman Horn, MD;  Location: [place];  Service: Ophthalmology;  Laterality: Left;  . Placement and suture of secondary intraocular lens Left 07/08/2013    Procedure: PLACEMENT AND SUTURE OF SECONDARY INTRAOCULAR LENS;  Surgeon: Hurman Horn, MD;  Location: [place];  Service: Ophthalmology;  Laterality: Left;  Insertion of Anterior Capsule Intraocular Lens    Family History  Problem Relation Age of Onset  . Hypotension Mother    History  Substance Use Topics  . Smoking status: Current Every Day Smoker -- 1.00 packs/day for 30 years    Types: Cigarettes    Last Attempt to Quit: 04/06/2012  . Smokeless tobacco: Never Used     Comment: quit   . Alcohol Use: 0.0 oz/week     Comment: 12 pk beer daily  06/2013 - no alcohol since 11/2012    Review of Systems  Constitutional: Negative for fever.  HENT: Negative for rhinorrhea and sore throat.   Eyes: Negative for redness.  Respiratory: Negative for cough.   Cardiovascular: Negative for chest pain.  Gastrointestinal: Negative for nausea, vomiting, abdominal pain and diarrhea.  Genitourinary: Negative for dysuria.  Musculoskeletal: Positive for arthralgias and myalgias.  Skin: Negative for rash.  Neurological: Negative for headaches.  Psychiatric/Behavioral: Positive for confusion.      Allergies  Review of patient's allergies indicates no known allergies.  Home Medications   Current Outpatient Rx  Name  Route  Sig  Dispense  Refill  . ALPRAZolam (XANAX) 1 MG tablet   Oral   Take 1 mg by mouth 3 (three) times daily.          Marland Kitchen amitriptyline (ELAVIL) 25 MG tablet   Oral   Take 1  tablet (25 mg total) by mouth at bedtime.   30 tablet   0   . diclofenac sodium (VOLTAREN) 1 % GEL   Topical   Apply 1 application topically daily as needed (back and leg pain).         Marland Kitchen HYDROcodone-acetaminophen (NORCO/VICODIN) 5-325 MG per tablet   Oral   Take 1 tablet by mouth 2 (two) times daily as needed (pain).          Marland Kitchen escitalopram (LEXAPRO) 10 MG tablet   Oral   Take 1 tablet (10 mg total) by mouth daily.   30 tablet   0   . folic acid (FOLVITE) 1 MG tablet   Oral   Take 1 tablet (1 mg total) by mouth daily.   30 tablet   0   . furosemide (LASIX) 40 MG tablet   Oral   Take 1 tablet (40 mg total) by mouth daily.   30 tablet   0   . insulin aspart (NOVOLOG) 100 UNIT/ML injection   Subcutaneous   Inject 6 Units into the skin 3 (three) times daily before meals.         . Insulin Glargine (LANTUS SOLOSTAR) 100 UNIT/ML SOPN   Subcutaneous   Inject 46 Units into the skin at bedtime.         Marland Kitchen lisinopril (PRINIVIL,ZESTRIL) 40 MG tablet   Oral   Take 1 tablet (40 mg total) by mouth daily. For high blood pressure management   30 tablet   0   . Multiple Vitamin (MULTIVITAMIN WITH MINERALS) TABS tablet   Oral   Take 1 tablet by mouth daily. (Resume with the vitamins that you have at home): For low vitamin   30 tablet   0   . ofloxacin (OCUFLOX) 0.3 % ophthalmic solution   Both Eyes   Place 1 drop into both eyes 4 (four) times daily.          . pantoprazole (PROTONIX) 40 MG tablet   Oral   Take 1 tablet (40 mg total) by mouth 2 (two) times daily.   60 tablet   3   . prednisoLONE acetate (PRED FORTE) 1 % ophthalmic suspension   Both Eyes   Place 1 drop into both eyes 3 (three) times daily.          . propranolol (INDERAL) 20 MG tablet   Oral   Take 1 tablet (20 mg total) by mouth 2 (two) times daily.   60 tablet   0   . spironolactone (ALDACTONE) 100 MG tablet   Oral   Take 1 tablet (100 mg total) by mouth daily.   30 tablet   0   .  thiamine 100 MG tablet   Oral   Take 1 tablet (100 mg total) by mouth daily.   30 tablet  0   . traMADol (ULTRAM) 50 MG tablet   Oral   Take 1 tablet (50 mg total) by mouth every 6 (six) hours as needed.   15 tablet   0    BP 130/63  Pulse 62  Temp(Src) 98.1 F (36.7 C) (Oral)  Resp 16  SpO2 99%  Physical Exam  Nursing note and vitals reviewed. Constitutional: He appears well-developed and well-nourished.  HENT:  Head: Normocephalic and atraumatic.  Right Ear: Tympanic membrane, external ear and ear canal normal.  Left Ear: Tympanic membrane, external ear and ear canal normal.  Nose: Nose normal.  Mouth/Throat: Uvula is midline, oropharynx is clear and moist and mucous membranes are normal.  Eyes: Conjunctivae, EOM and lids are normal. Pupils are equal, round, and reactive to light. Right eye exhibits no discharge. Left eye exhibits no discharge.  Neck: Normal range of motion. Neck supple.  Cardiovascular: Normal rate, regular rhythm and normal heart sounds.   Pulses:      Radial pulses are 2+ on the right side, and 2+ on the left side.  Pulmonary/Chest: Effort normal and breath sounds normal.  Abdominal: Soft. There is no tenderness.  Musculoskeletal: He exhibits no edema.       Right shoulder: Normal.       Left shoulder: He exhibits decreased range of motion and tenderness.       Left elbow: Normal.       Left wrist: Normal.       Cervical back: Normal. He exhibits normal range of motion, no tenderness and no bony tenderness.  Neurological: He is alert. He has normal strength. No cranial nerve deficit or sensory deficit. He exhibits normal muscle tone. GCS eye subscore is 4. GCS verbal subscore is 5. GCS motor subscore is 6.  States it is July 2013. Otherwise oriented. ? [place]. Able to hold up L arm briefly but is having a lot of pain with movement in L shoulder. Unable to test for arm drift due to this.   Skin: Skin is warm and dry.  Psychiatric: He has a normal  mood and affect.    ED Course  Procedures (including critical care time) Labs Review Labs Reviewed  CBC - Abnormal; Notable for the following:    RBC 3.94 (*)    Hemoglobin 12.5 (*)    HCT 35.3 (*)    RDW 16.2 (*)    Platelets 65 (*)    All other components within normal limits  COMPREHENSIVE METABOLIC PANEL - Abnormal; Notable for the following:    Sodium 136 (*)    Glucose, Bld 197 (*)    Albumin 3.3 (*)    AST 43 (*)    Total Bilirubin 2.3 (*)    GFR calc non Af Amer 62 (*)    GFR calc Af Amer 72 (*)    All other components within normal limits  AMMONIA - Abnormal; Notable for the following:    Ammonia 121 (*)    All other components within normal limits  CBG MONITORING, ED - Abnormal; Notable for the following:    Glucose-Capillary 195 (*)    All other components within normal limits  ETHANOL  URINALYSIS, ROUTINE W REFLEX MICROSCOPIC  URINE RAPID DRUG SCREEN (HOSP PERFORMED)   Imaging Review No results found.   EKG Interpretation None      7:05 PM Patient seen and examined. Work-up initiated.   Vital signs reviewed and are as follows: Filed Vitals:   08/09/13 1825  BP: 130/63  Pulse: 62  Temp:   Resp: 16   10:04 PM Ammonia elevated. [facility] consulted for admission.   MDM   Final diagnoses:  Hepatic encephalopathy   Admit for altered mental status in setting of elevated ammonia.     Carlisle Cater, PA-C 08/09/13 2205

## 2013-08-10 ENCOUNTER — Encounter (HOSPITAL_COMMUNITY): Payer: Self-pay | Admitting: *Deleted

## 2013-08-10 DIAGNOSIS — D61818 Other pancytopenia: Secondary | ICD-10-CM

## 2013-08-10 DIAGNOSIS — F1010 Alcohol abuse, uncomplicated: Secondary | ICD-10-CM

## 2013-08-10 LAB — GLUCOSE, CAPILLARY
GLUCOSE-CAPILLARY: 151 mg/dL — AB (ref 70–99)
Glucose-Capillary: 211 mg/dL — ABNORMAL HIGH (ref 70–99)
Glucose-Capillary: 196 mg/dL — ABNORMAL HIGH (ref 70–99)
Glucose-Capillary: 122 mg/dL — ABNORMAL HIGH (ref 70–99)

## 2013-08-10 LAB — COMPREHENSIVE METABOLIC PANEL
ALBUMIN: 2.6 g/dL — AB (ref 3.5–5.2)
ALK PHOS: 74 U/L (ref 39–117)
ALT: 16 U/L (ref 0–53)
AST: 32 U/L (ref 0–37)
BUN: 21 mg/dL (ref 6–23)
CO2: 22 mEq/L (ref 19–32)
Calcium: 9.2 mg/dL (ref 8.4–10.5)
Chloride: 104 mEq/L (ref 96–112)
Creatinine, Ser: 1.33 mg/dL (ref 0.50–1.35)
GFR calc Af Amer: 67 mL/min — ABNORMAL LOW (ref >90)
GFR calc non Af Amer: 58 mL/min — ABNORMAL LOW (ref >90)
GLUCOSE: 227 mg/dL — AB (ref 70–99)
POTASSIUM: 4.5 meq/L (ref 3.7–5.3)
SODIUM: 138 meq/L (ref 137–147)
Total Bilirubin: 1.8 mg/dL — ABNORMAL HIGH (ref 0.3–1.2)
Total Protein: 6 g/dL (ref 6.0–8.3)

## 2013-08-10 LAB — AMMONIA: Ammonia: 174 umol/L — ABNORMAL HIGH (ref 11–60)

## 2013-08-10 LAB — CBC
HEMATOCRIT: 30.3 % — AB (ref 39.0–52.0)
HEMOGLOBIN: 10.5 g/dL — AB (ref 13.0–17.0)
MCH: 31.3 pg (ref 26.0–34.0)
MCHC: 34.7 g/dL (ref 30.0–36.0)
MCV: 90.4 fL (ref 78.0–100.0)
Platelets: 46 10*3/uL — ABNORMAL LOW (ref 150–400)
RBC: 3.35 MIL/uL — ABNORMAL LOW (ref 4.22–5.81)
RDW: 16.4 % — AB (ref 11.5–15.5)
WBC: 3.6 10*3/uL — AB (ref 4.0–10.5)

## 2013-08-10 MED ORDER — ALPRAZOLAM 1 MG PO TABS
1.0000 mg | ORAL_TABLET | Freq: Three times a day (TID) | ORAL | Status: DC | PRN
  Administered 2013-08-11: 1 mg via ORAL
  Filled 2013-08-10 (×2): qty 1

## 2013-08-10 MED ORDER — LORAZEPAM 2 MG/ML IJ SOLN
1.0000 mg | Freq: Once | INTRAMUSCULAR | Status: AC
  Administered 2013-08-10: 1 mg via INTRAVENOUS
  Filled 2013-08-10: qty 1

## 2013-08-10 NOTE — Progress Notes (Signed)
Pt very confused.  Becoming angry and agitated at staff.  Wants to leave.  Keeps calling me his girlfriend.  Does not know where he is at and can not re orientate him to time or place.  Called PA on call and one time order for ativan 1 mg.  Try and get him to calm down and rest.  Will continue to monitor.

## 2013-08-10 NOTE — Progress Notes (Signed)
[facility] PROGRESS NOTE  [name] ZOX:[id number] DOB: 1956/04/13 DOA: 08/09/2013 PCP: Barbette Merino, MD  Assessment/Plan: Principal Problem:  Hepatic encephalopathy  Active Problems:  Diabetes mellitus  Thrombocytopenia  Cirrhosis    58 y/o male with PMH of DM, COPD, HTN, drug abuse, alcoholism, liver cirrhosis is admitted with mild hepatic encephalopathy; he has not been taking lactulose   1. Hepatic encephalopathy due to not taking lactulose -improving, restarted lactulose;  Cont rifaximin; no s/s of infection; abd exam benign  -change benzo as needed; to prevent sedation, but cont due to risk of withdrawal   2. DM continue home meds, CBG checks AC/HS   3. Cirrhosis of liver - chronic and i suspect this remains stable; cont diuretics  4. Thrombocytopenia - chronic due to #3; no s/s of bleeding; monitor   5. Left shoulder pain - musculoskeletal; cont pain control   dvt prophulaxis: SCD   Code Status: full Family Communication: d/w patient (indicate person spoken with, relationship, and if by phone, the number) Disposition Plan: home ?3/22   Consultants:  none  Procedures:  None   Antibiotics:  None  (indicate start date, and stop date if known)  HPI/Subjective: alert  Objective: Filed Vitals:   08/10/13 0635  BP: 98/55  Pulse: 64  Temp: 98.1 F (36.7 C)  Resp: 16    Intake/Output Summary (Last 24 hours) at 08/10/13 1013 Last data filed at 08/10/13 0815  Gross per 24 hour  Intake    240 ml  Output      0 ml  Net    240 ml   Filed Weights   08/09/13 2315  Weight: 93.2 kg (205 lb 7.5 oz)    Exam:   General:  alert  Cardiovascular: s1,s2 rrr  Respiratory: CTA BL  Abdomen: soft, nt,n d  Musculoskeletal: no LE edema   Data Reviewed: Basic Metabolic Panel:  Recent Labs Lab 08/09/13 1830 08/10/13 0519  NA 136* 138  K 4.8 4.5  CL 100 104  CO2 22 22  GLUCOSE 197* 227*  BUN 23 21  CREATININE 1.26 1.33  CALCIUM 9.8 9.2    Liver Function Tests:  Recent Labs Lab 08/09/13 1830 08/10/13 0519  AST 43* 32  ALT 22 16  ALKPHOS 91 74  BILITOT 2.3* 1.8*  PROT 7.1 6.0  ALBUMIN 3.3* 2.6*   No results found for this basename: LIPASE, AMYLASE,  in the last 168 hours  Recent Labs Lab 08/09/13 1915  AMMONIA 121*   CBC:  Recent Labs Lab 08/09/13 1830 08/10/13 0519  WBC 4.6 3.6*  HGB 12.5* 10.5*  HCT 35.3* 30.3*  MCV 89.6 90.4  PLT 65* 46*   Cardiac Enzymes: No results found for this basename: CKTOTAL, CKMB, CKMBINDEX, TROPONINI,  in the last 168 hours BNP (last 3 results) No results found for this basename: PROBNP,  in the last 8760 hours CBG:  Recent Labs Lab 08/09/13 1831  GLUCAP 195*    No results found for this or any previous visit (from the past 240 hour(s)).   Studies: Dg Shoulder Left  08/09/2013   CLINICAL DATA:  [place] and injured left shoulder.  EXAM: LEFT SHOULDER - 2+ VIEW  COMPARISON:  None.  FINDINGS: No evidence of acute fracture or glenohumeral dislocation. V shaped notch in the posterior aspect of the left humeral head. Subacromial space well preserved. Acromioclavicular joint intact.  IMPRESSION: 1. No acute osseous abnormality. 2. Possible Hill-Sachs deformity involving the humeral head. Does the patient have a history  of glenohumeral dislocation?   Electronically Signed   By: Evangeline Dakin M.D.   On: 08/09/2013 23:25    Scheduled Meds: . ALPRAZolam  1 mg Oral TID  . amitriptyline  25 mg Oral QHS  . escitalopram  10 mg Oral Daily  . folic acid  1 mg Oral Daily  . insulin aspart  12 Units Subcutaneous TID AC  . insulin glargine  45 Units Subcutaneous QHS  . lactulose  30 g Oral TID  . lisinopril  20 mg Oral Daily  . pantoprazole  40 mg Oral BID  . propranolol  20 mg Oral BID  . rifaximin  550 mg Oral BID  . spironolactone  100 mg Oral Daily  . thiamine  100 mg Oral Daily   Continuous Infusions:   Principal Problem:   Hepatic encephalopathy Active Problems:    Diabetes mellitus   Thrombocytopenia   Cirrhosis    Time spent: >35 minutes     Kinnie Feil  [facility] Pager [phone]. If 7PM-7AM, please contact night-coverage at [email or web address], password [email or web address] 08/10/2013, 10:13 AM  LOS: 1 day

## 2013-08-11 DIAGNOSIS — I10 Essential (primary) hypertension: Secondary | ICD-10-CM

## 2013-08-11 LAB — GLUCOSE, CAPILLARY
GLUCOSE-CAPILLARY: 82 mg/dL (ref 70–99)
GLUCOSE-CAPILLARY: 233 mg/dL — AB (ref 70–99)
Glucose-Capillary: 153 mg/dL — ABNORMAL HIGH (ref 70–99)
Glucose-Capillary: 172 mg/dL — ABNORMAL HIGH (ref 70–99)

## 2013-08-11 LAB — PROTIME-INR
INR: 1.42 (ref 0.00–1.49)
Prothrombin Time: 17 seconds — ABNORMAL HIGH (ref 11.6–15.2)

## 2013-08-11 MED ORDER — HALOPERIDOL LACTATE 5 MG/ML IJ SOLN
2.0000 mg | Freq: Once | INTRAMUSCULAR | Status: AC
  Administered 2013-08-11: 2 mg via INTRAMUSCULAR
  Filled 2013-08-11: qty 1

## 2013-08-11 MED ORDER — LACTULOSE 10 GM/15ML PO SOLN
30.0000 g | Freq: Four times a day (QID) | ORAL | Status: DC
  Administered 2013-08-11 – 2013-08-12 (×7): 30 g via ORAL
  Filled 2013-08-11 (×8): qty 45

## 2013-08-11 MED ORDER — ALPRAZOLAM 1 MG PO TABS
1.0000 mg | ORAL_TABLET | Freq: Three times a day (TID) | ORAL | Status: DC | PRN
  Administered 2013-08-11 – 2013-08-12 (×2): 1 mg via ORAL
  Filled 2013-08-11 (×2): qty 1

## 2013-08-11 MED ORDER — LORAZEPAM 2 MG/ML IJ SOLN
1.0000 mg | Freq: Once | INTRAMUSCULAR | Status: AC
  Administered 2013-08-12: 1 mg via INTRAVENOUS
  Filled 2013-08-11: qty 1

## 2013-08-11 NOTE — Progress Notes (Signed)
Pt resting now.  When he does wake up he want to leave.  Not orientated to time or place only self.  Still tries to get up but able to readjust him in the bed and he does go back to sleep,  Continues to be more confused.  Kathline Magic PA on floor informed him of mental changes of pt.  Pt denies any ETOH use in a couple of months maybe longer.  Still monitoring for safety issues.

## 2013-08-11 NOTE — Progress Notes (Signed)
Nutrition Brief Note  Patient identified on the Malnutrition Screening Tool (MST) Report  Wt Readings from Last 15 Encounters:  08/09/13 205 lb 7.5 oz (93.2 kg)  08/02/13 216 lb (97.977 kg)  07/08/13 232 lb (105.235 kg)  07/08/13 232 lb (105.235 kg)  04/01/13 254 lb 8 oz (115.44 kg)  03/14/13 239 lb 3.2 oz (108.5 kg)  03/14/13 239 lb 3.2 oz (108.5 kg)  03/09/13 232 lb (105.235 kg)  02/05/13 233 lb (105.688 kg)  12/13/12 250 lb (113.4 kg)  11/05/12 215 lb (97.523 kg)  09/15/12 180 lb (81.647 kg)  07/19/12 221 lb 1.9 oz (100.3 kg)  06/19/12 234 lb 1.6 oz (106.187 kg)  04/27/12 225 lb 15.5 oz (102.5 kg)   Patient has lost about 45 pounds in the last 8 months. However, he reports that this is intentional and desired, and is the result of eating better and walking. Appetite is good.   Body mass index is 29.48 kg/(m^2). Patient meets criteria for overweight based on current BMI.   Current diet order is Heart Healthy, patient is consuming approximately 100% of meals at this time. Labs and medications reviewed.   No nutrition interventions warranted at this time. If nutrition issues arise, please consult RD.   Larey Seat, RD, LDN Pager #: [phone] After-Hours Pager #: [phone]

## 2013-08-11 NOTE — Progress Notes (Addendum)
[facility] PROGRESS NOTE  [name] QQV:[id number] DOB: 03-24-1956 DOA: 08/09/2013 PCP: Barbette Merino, MD  Assessment/Plan: Principal Problem:  Hepatic encephalopathy  Active Problems:  Diabetes mellitus  Thrombocytopenia  Cirrhosis    58 y/o male with PMH of DM, COPD, HTN, drug abuse, alcoholism, liver cirrhosis is admitted with mild hepatic encephalopathy; he has not been taking lactulose   1. Hepatic encephalopathy due to not taking lactulose; ammonia increasing  -intermittent confusion, restarted lactulose;  cont rifaximin; no s/s of infection; abd exam benign  -hold benzo (use prn only to prevent withdrawal), opioids, amitriptiline ? worsening confusion;  -3/22: d/w GI Dr. Amedeo Plenty who recommended to increase lactulose; obtain INR; recheck ammonia in AM  2. DM; HA1C-7.7 (04/2013) -cont lantus; hold meal time insulin due to risk of hypoglycemia; cont iSS   3. Cirrhosis of liver  -obtain liver, abd [place]; if significant ascites need paracentesis   4. Thrombocytopenia - chronic due to #3; no s/s of bleeding; monitor   5. Left shoulder pain - musculoskeletal; cont pain control   D/w  Gregory,Norma Significant other [phone]  dvt prophulaxis: SCD   Code Status: full Family Communication: d/w patient, his significant other  (indicate person spoken with, relationship, and if by phone, the number) Disposition Plan: when clinically improved    Consultants:  none  Procedures:  None   Antibiotics:  None  (indicate start date, and stop date if known)  HPI/Subjective: alert  Objective: Filed Vitals:   08/11/13 0910  BP: 93/50  Pulse: 58  Temp:   Resp: 16    Intake/Output Summary (Last 24 hours) at 08/11/13 1011 Last data filed at 08/11/13 0054  Gross per 24 hour  Intake    540 ml  Output   2153 ml  Net  -1613 ml   Filed Weights   08/09/13 2315  Weight: 93.2 kg (205 lb 7.5 oz)    Exam:   General:  alert  Cardiovascular: s1,s2  rrr  Respiratory: CTA BL  Abdomen: soft, nt,n d  Musculoskeletal: no LE edema   Data Reviewed: Basic Metabolic Panel:  Recent Labs Lab 08/09/13 1830 08/10/13 0519  NA 136* 138  K 4.8 4.5  CL 100 104  CO2 22 22  GLUCOSE 197* 227*  BUN 23 21  CREATININE 1.26 1.33  CALCIUM 9.8 9.2   Liver Function Tests:  Recent Labs Lab 08/09/13 1830 08/10/13 0519  AST 43* 32  ALT 22 16  ALKPHOS 91 74  BILITOT 2.3* 1.8*  PROT 7.1 6.0  ALBUMIN 3.3* 2.6*   No results found for this basename: LIPASE, AMYLASE,  in the last 168 hours  Recent Labs Lab 08/09/13 1915 08/10/13 1559  AMMONIA 121* 174*   CBC:  Recent Labs Lab 08/09/13 1830 08/10/13 0519  WBC 4.6 3.6*  HGB 12.5* 10.5*  HCT 35.3* 30.3*  MCV 89.6 90.4  PLT 65* 46*   Cardiac Enzymes: No results found for this basename: CKTOTAL, CKMB, CKMBINDEX, TROPONINI,  in the last 168 hours BNP (last 3 results) No results found for this basename: PROBNP,  in the last 8760 hours CBG:  Recent Labs Lab 08/10/13 0812 08/10/13 1149 08/10/13 1640 08/10/13 2205 08/11/13 0718  GLUCAP 211* 196* 151* 122* 82    No results found for this or any previous visit (from the past 240 hour(s)).   Studies: Dg Shoulder Left  08/09/2013   CLINICAL DATA:  [place] and injured left shoulder.  EXAM: LEFT SHOULDER - 2+ VIEW  COMPARISON:  None.  FINDINGS: No evidence of acute fracture or glenohumeral dislocation. V shaped notch in the posterior aspect of the left humeral head. Subacromial space well preserved. Acromioclavicular joint intact.  IMPRESSION: 1. No acute osseous abnormality. 2. Possible Hill-Sachs deformity involving the humeral head. Does the patient have a history of glenohumeral dislocation?   Electronically Signed   By: Evangeline Dakin M.D.   On: 08/09/2013 23:25    Scheduled Meds: . amitriptyline  25 mg Oral QHS  . escitalopram  10 mg Oral Daily  . folic acid  1 mg Oral Daily  . insulin aspart  12 Units Subcutaneous TID AC   . insulin glargine  45 Units Subcutaneous QHS  . lactulose  30 g Oral TID  . lisinopril  20 mg Oral Daily  . pantoprazole  40 mg Oral BID  . propranolol  20 mg Oral BID  . rifaximin  550 mg Oral BID  . spironolactone  100 mg Oral Daily  . thiamine  100 mg Oral Daily   Continuous Infusions:   Principal Problem:   Hepatic encephalopathy Active Problems:   Diabetes mellitus   Thrombocytopenia   Cirrhosis    Time spent: >35 minutes     Kinnie Feil  [facility] Pager [phone]. If 7PM-7AM, please contact night-coverage at [email or web address], password [facility] 08/11/2013, 10:11 AM  LOS: 2 days

## 2013-08-12 ENCOUNTER — Observation Stay (HOSPITAL_COMMUNITY): Payer: Medicare PPO

## 2013-08-12 LAB — COMPREHENSIVE METABOLIC PANEL
ALT: 18 U/L (ref 0–53)
AST: 37 U/L (ref 0–37)
Albumin: 2.8 g/dL — ABNORMAL LOW (ref 3.5–5.2)
Alkaline Phosphatase: 80 U/L (ref 39–117)
BUN: 22 mg/dL (ref 6–23)
CO2: 18 mEq/L — ABNORMAL LOW (ref 19–32)
Calcium: 9 mg/dL (ref 8.4–10.5)
Chloride: 103 mEq/L (ref 96–112)
Creatinine, Ser: 1.22 mg/dL (ref 0.50–1.35)
GFR calc Af Amer: 74 mL/min — ABNORMAL LOW (ref >90)
GFR calc non Af Amer: 64 mL/min — ABNORMAL LOW (ref >90)
Glucose, Bld: 138 mg/dL — ABNORMAL HIGH (ref 70–99)
POTASSIUM: 4.5 meq/L (ref 3.7–5.3)
SODIUM: 135 meq/L — AB (ref 137–147)
TOTAL PROTEIN: 6.2 g/dL (ref 6.0–8.3)
Total Bilirubin: 1.4 mg/dL — ABNORMAL HIGH (ref 0.3–1.2)

## 2013-08-12 LAB — GLUCOSE, CAPILLARY
GLUCOSE-CAPILLARY: 150 mg/dL — AB (ref 70–99)
GLUCOSE-CAPILLARY: 175 mg/dL — AB (ref 70–99)
GLUCOSE-CAPILLARY: 153 mg/dL — AB (ref 70–99)

## 2013-08-12 LAB — CBC
HCT: 31.7 % — ABNORMAL LOW (ref 39.0–52.0)
Hemoglobin: 10.9 g/dL — ABNORMAL LOW (ref 13.0–17.0)
MCH: 31.2 pg (ref 26.0–34.0)
MCHC: 34.4 g/dL (ref 30.0–36.0)
MCV: 90.8 fL (ref 78.0–100.0)
PLATELETS: 55 10*3/uL — AB (ref 150–400)
RBC: 3.49 MIL/uL — ABNORMAL LOW (ref 4.22–5.81)
RDW: 16.4 % — ABNORMAL HIGH (ref 11.5–15.5)
WBC: 3.9 10*3/uL — AB (ref 4.0–10.5)

## 2013-08-12 LAB — AMMONIA: AMMONIA: 121 umol/L — AB (ref 11–60)

## 2013-08-12 MED ORDER — ALPRAZOLAM 1 MG PO TABS: 1.0000 mg | ORAL_TABLET | Freq: Two times a day (BID) | ORAL | Status: DC | PRN

## 2013-08-12 MED ORDER — LACTULOSE 10 GM/15ML PO SOLN: 30.0000 g | Freq: Four times a day (QID) | ORAL | Status: DC

## 2013-08-12 NOTE — Discharge Instructions (Signed)
Hepatic Encephalopathy °Hepatic encephalopathy is a syndrome. This is a set of symptoms that occur together. It is seen mostly in patients with damage to the liver known as cirrhosis. This is where normal liver tissue has been replaced by scar tissue.  °Symptoms of the syndrome include: °· Changes in personality. °· Mental impairment. °· A depressed level of consciousness. °These changes occur because toxins build up in the bloodstream. The build up occurs because the scarred liver cannot rid toxins from the body. The most important of these toxins is ammonia. Toxins can cause abnormal behavior and confusion. Toxins in the blood stream can impair your ability to take care of yourself or others. Some people become very sleepy and cannot be woken easily. In severe cases, the patient lapses into a coma.  °CAUSES  °There are many things that can cause liver damage that can lead to buildup of toxins. These include: °· Diseases that cause cirrhosis of the liver. °· Long-term alcohol use with progressive liver damage. °· Hepatitis B or C with ongoing infection and liver damage. °· Patients without cirrhosis who have undergone shunt surgery. °· Kidney failure. °· Bleeding in the stomach or intestines. °· Infection. °· Constipation. °· Medications that act upon the central nervous system. °· Diuretic therapy. °· Excessive dietary protein. °SYMPTOMS  °Symptoms of this syndrome are categorized or "staged" based on severity.  °· Stage 0. Minimal hepatic encephalopathy. No detectable changes in personality or behavior. Minimal changes in memory, concentration, mental function, and physical ability. °· Stage 1. Some lack of awareness. Shortened attention span. Problems with addition or subtraction. Possible problems with sleeping or a reversal of the normal sleep pattern. Euphoria, depression, or irritability may be present. Mild confusion. Slowing of mental ability. Tremors may be detected. °· Stage 2. Lethargy or apathy.  Disoriented. Strange behavior. Slurred speech. Obvious tremors. Drowsiness, unable to perform mental tasks. Personality changes, and confusion about time. °· Stage 3. Very sleepy but can be aroused. Unable to perform mental tasks, cannot keep track of time and place, marked confusion, amnesia, occasional fits of rage, speech cannot be understood. °· Stage 4. Coma with or without response to painful stimuli. °DIAGNOSIS  °In mild cases, a careful history and physical exam may lead your caregiver to consider possible mild hepatic encephalopathy as the cause of symptoms. The diagnosis is clearer in more severe cases. An elevated blood ammonia level is the classic blood test abnormality in patients with this syndrome. Other tests can be helpful to rule out other diseases.  °TREATMENT  °· Medications are often used to lower the ammonia level in the blood. This usually leads to improvement. °· Diets containing vegetable proteins are better than diets rich in animal protein, especially proteins derived from red meats. Eating well-cooked chicken and fish in addition to vegetable protein should be discussed with your caregiver. Malnourished patients are encouraged to add liquid nutritional supplements to their diet. °· Antibiotics are sometimes used to try to lessen the volume of bacteria in the intestines that produce ammonia. °· Moderate to severe cases of this syndrome usually require a hospital stay and medicine that is given directly into a vein (intravenously). °HOME CARE INSTRUCTIONS  °The goal at home is to avoid things that can make the condition worse and lead to a buildup of ammonia in the blood. °· Eat a well balanced diet. Your caregiver can help you with suggestions on this. °· Talk to your caregiver before taking vitamin supplements. Large doses of vitamins and minerals,   especially vitamin A, iron, or copper, can worsen liver damage. °· A low salt diet, water restriction, or diuretic medicine may be needed to  reduce fluid retention. °· Avoid alcohol and acetaminophen as well as any over-the-counter medications that contain acetaminophen (check labels). Only take over-the-counter or prescription medicines for pain, discomfort, or fever as directed by your caregiver. °· Avoid drugs that are toxic to the liver. Review your medications (both prescription and non-prescription) with your caregiver to make sure those you are taking will not be harmful. °· Blood tests may be needed. Follow your caregiver's advice regarding the timing of these. °· With this condition you play a critical role in maintaining your own good health. The failure to follow your caregiver's advice and these instructions may result in permanent disability or death. °SEEK MEDICAL CARE IF:  °· You have increasing fatigue or weakness. °· You develop increasing swelling of the abdomen, hands, feet, legs or face. °· You develop loss of appetite. °· You are feeling sick to your stomach (nausea) and vomiting. °· You develop jaundice. This is a yellow discoloration of the skin. °· You develop worsening problems with concentration, confusion, and/or problems with sleep. °SEEK IMMEDIATE MEDICAL CARE IF:  °· You vomit bright red blood or a coffee ground-looking material. °· You have blood in your stools. Or the stools turn black and tarry. °· You have a fever. °· You develop easy bruising or bleeding. °· You have a return of slurred speech, change in behavior, or confusion. °MAKE SURE YOU:  °· Understand these instructions. °· Will watch your condition. °· Will get help right away if you are not doing well or get worse. °Document Released: 07/19/2006 Document Revised: 08/01/2011 Document Reviewed: 04/25/2007 °[employer]® Patient Information ©2014 [employer]. ° °

## 2013-08-12 NOTE — Progress Notes (Signed)
Pt not as agitated at last night but still doing as he wants.  Up walking around room and hallway.  Has a more steady gate but still needs someone with him.  Gave 1 mg of ativan IV and he was very happy to get it. Drank all of his lactulose with difficulty,  No c/o pain or destress

## 2013-08-12 NOTE — Discharge Summary (Addendum)
Physician Discharge Summary  [name] [id number] DOB: April 28, 1956 DOA: 08/09/2013  PCP: Barbette Merino, MD  Admit date: 08/09/2013 Discharge date: 08/12/2013  Time spent: 40  minutes  Recommendations for Outpatient Follow-up:  1. Home with outpt PCP follow up. Please monitor fsg and see need for adding back premeal aspart  Discharge Diagnoses:  Principal Problem:   Hepatic encephalopathy  Active Problems:   Diabetes mellitus   Thrombocytopenia   Cirrhosis   Discharge Condition: fair  Diet recommendation: diabetic  Filed Weights   08/09/13 2315  Weight: 93.2 kg (205 lb 7.5 oz)    History of present illness:  Please refer to admission H&P for details, but in brief, 58 y/o male with PMH of DM, COPD, HTN, drug abuse, alcoholism, liver cirrhosis was  admitted with mild hepatic encephalopathy as  he had not been taking lactulose.    Hospital Course:  1. Hepatic encephalopathy  due to not taking lactulose. elevated ammonia level - restarted lactulose; cont rifaximin. no s/s of infection. abd exam benign  -use benzos  prn only to prevent withdrawal), opioids, amitriptiline ? worsening confusion. -3/22: d/w GI Dr. Amedeo Plenty who recommended to increase lactulose. Mental status improving. ammonia level still high but improving ( 121 today)mental status much improved. Discussed with significant other and patient's brother regarding medication complinace. Brother plans to stay with him during the day while his significant other is at work.  2. DM; HA1C-7.7 (04/2013)  -cont lantus; will d/c premeal  dose given risk for hypoglycemia and should  be monitored as outpt  Cirrhosis of liver  US abdomen shows findings of  cirrhosis  4. Thrombocytopenia - chronic due to cirrhosis. No signs of bleeding. 5. Left shoulder pain - musculoskeletal; . CT of the shoulder done was unremarkable for any dislocation.   Code Status: full  Family Communication: d/w brother and norma I his significant  other).  Disposition Plan: home  Consultants:  none Procedures:  None  Antibiotics:  None       Discharge Exam: Filed Vitals:   08/12/13 1331  BP: 106/59  Pulse: 72  Temp: 98.5 F (36.9 C)  Resp: 71    General:middle aged mael in NAD HEENT: no pallor, moist mucosa Chest clear b/l, no added sounds CVS: normal S1&S2, no MRG abd: soft, NT, ND, BS+ Ext: Warm, no edema CNS: AAOX2-3, no tremors, non focal    Discharge Instructions     Medication List    STOP taking these medications       amitriptyline 25 MG tablet  Commonly known as:  ELAVIL     amoxicillin 500 MG capsule  Commonly known as:  AMOXIL     escitalopram 10 MG tablet  Commonly known as:  LEXAPRO     HYDROcodone-acetaminophen 5-325 MG per tablet  Commonly known as:  NORCO/VICODIN     prednisoLONE acetate 1 % ophthalmic suspension  Commonly known as:  PRED FORTE         insulin aspart 100 UNIT/ML injection  Commonly known as:  novoLOG  Inject 12 Units into the skin 3 (three) times daily before meals.    TAKE these medications                diclofenac sodium 1 % Gel  Commonly known as:  VOLTAREN  Apply 1 application topically daily as needed (back and leg pain).     folic acid 1 MG tablet  Commonly known as:  FOLVITE  Take 1 tablet (1 mg total) by  mouth daily.       Tab xanax 0.5 mg po BID prn anxiety       lactulose 10 GM/15ML solution  Commonly known as:  CHRONULAC  Take 45 mLs (30 g total) by mouth 4 (four) times daily. Titrate to at least 3 BMs daily     LANTUS SOLOSTAR 100 UNIT/ML Solostar Pen  Generic drug:  Insulin Glargine  Inject 45 Units into the skin at bedtime.     lisinopril 20 MG tablet  Commonly known as:  PRINIVIL,ZESTRIL  Take 20 mg by mouth daily.     pantoprazole 40 MG tablet  Commonly known as:  PROTONIX  Take 1 tablet (40 mg total) by mouth 2 (two) times daily.     propranolol 20 MG tablet  Commonly known as:  INDERAL  Take 1 tablet (20 mg  total) by mouth 2 (two) times daily.     rifaximin 550 MG Tabs tablet  Commonly known as:  XIFAXAN  Take 550 mg by mouth 2 (two) times daily.     spironolactone 100 MG tablet  Commonly known as:  ALDACTONE  Take 1 tablet (100 mg total) by mouth daily.     thiamine 100 MG tablet  Take 1 tablet (100 mg total) by mouth daily.     traMADol 50 MG tablet  Commonly known as:  ULTRAM  Take 1 tablet (50 mg total) by mouth every 6 (six) hours as needed.      ASK your doctor about these medications       ofloxacin 0.3 % ophthalmic solution  Commonly known as:  OCUFLOX  Place 1 drop into both eyes 4 (four) times daily.       No Known Allergies     Follow-up Information   Follow up with GARBA,LAWAL, MD In 1 week.   Specialty:  Internal Medicine   Contact information:   [address] [place] [id number] [phone]        The results of significant diagnostics from this hospitalization (including imaging, microbiology, ancillary and laboratory) are listed below for reference.    Significant Diagnostic Studies: US Abdomen Complete  08/12/2013   CLINICAL DATA:  Cirrhosis.  Question of ascites.  EXAM: ULTRASOUND ABDOMEN COMPLETE  COMPARISON:  04/01/2013  FINDINGS: Gallbladder:  The gallbladder is contracted. Gallbladder wall is 2.7 mm in thickness. No stones identified. No pericholecystic fluid or sonographic Murphy's sign.  Common bile duct:  Diameter: 5.0 mm  Liver:  Liver is coarse and heterogeneous in echotexture. No focal liver lesions are identified. Liver contour is mildly scalloped, consistent with the history of cirrhosis.  IVC:  No abnormality visualized.  Pancreas:  There is limited visualization of the pancreatic tail because of overlying bowel gas. Pancreatic head and neck have a normal appearance.  Spleen:  13.6 cm in length.  Volume is 712.5 cubic cm.  Right Kidney:  Length: 12.8 cm. Echogenicity within normal limits. No mass or hydronephrosis visualized.  Left Kidney:   Length: 13.4 cm. Echogenicity within normal limits. No mass or hydronephrosis visualized.  Abdominal aorta:  Limited portion is not aneurysmal.  Other findings:  Prominent vessels are identified anterior to the liver. Question of varices.  IMPRESSION: 1. Liver contour consistent with cirrhosis. 2. Splenomegaly, consistent with portal venous hypertension. 3. No evidence for acute cholecystitis. 4. Question of varices.   Electronically Signed   By: Shon Hale M.D.   On: 08/12/2013 15:41   Ct Shoulder Left Wo Contrast  08/12/2013  CLINICAL DATA:  Left shoulder pain. Abnormal radiographs dated 08/09/2013  EXAM: CT OF THE LEFT SHOULDER WITHOUT CONTRAST  TECHNIQUE: Multidetector CT imaging was performed according to the standard protocol. Multiplanar CT image reconstructions were also generated.  COMPARISON:  Radiographs dated 08/09/2013  FINDINGS: There are small cystic degenerative changes in the anterior aspect of the greater tuberosity of the proximal left humerus with smaller cystic degenerative changes of the lesser tuberosity. There is a small focal contour abnormality of the posterior lateral aspect of the humeral head which could represent a subtle Hill-Sachs lesion. However, there is no other evidence of previous anterior dislocation. No osseous [place] lesion. Minimal osteophyte formation on the anterior aspect of the glenoid.  No appreciable glenohumeral joint effusion. Minimal degenerative changes of the acromioclavicular joint with a type 2 acromion. No atrophy of the muscles of the rotator cuff. No appreciable rotator cuff tear. Muscles and osseous structures of the visualized portion of the chest wall appear normal.  IMPRESSION: Minimal degenerative changes of the left glenohumeral joint. Small contour abnormality of the posterior lateral aspect of the humeral head could represent a subtle Hill-Sachs lesion but there are no other signs suggestive of previous anterior dislocation.   Electronically  Signed   By: Rozetta Nunnery M.D.   On: 08/12/2013 14:01   Dg Shoulder Left  08/09/2013   CLINICAL DATA:  [place] and injured left shoulder.  EXAM: LEFT SHOULDER - 2+ VIEW  COMPARISON:  None.  FINDINGS: No evidence of acute fracture or glenohumeral dislocation. V shaped notch in the posterior aspect of the left humeral head. Subacromial space well preserved. Acromioclavicular joint intact.  IMPRESSION: 1. No acute osseous abnormality. 2. Possible Hill-Sachs deformity involving the humeral head. Does the patient have a history of glenohumeral dislocation?   Electronically Signed   By: Evangeline Dakin M.D.   On: 08/09/2013 23:25    Microbiology: No results found for this or any previous visit (from the past 240 hour(s)).   Labs: Basic Metabolic Panel:  Recent Labs Lab 08/09/13 1830 08/10/13 0519 08/12/13 0532  NA 136* 138 135*  K 4.8 4.5 4.5  CL 100 104 103  CO2 22 22 18*  GLUCOSE 197* 227* 138*  BUN 23 21 22   CREATININE 1.26 1.33 1.22  CALCIUM 9.8 9.2 9.0   Liver Function Tests:  Recent Labs Lab 08/09/13 1830 08/10/13 0519 08/12/13 0532  AST 43* 32 37  ALT 22 16 18   ALKPHOS 91 74 80  BILITOT 2.3* 1.8* 1.4*  PROT 7.1 6.0 6.2  ALBUMIN 3.3* 2.6* 2.8*   No results found for this basename: LIPASE, AMYLASE,  in the last 168 hours  Recent Labs Lab 08/09/13 1915 08/10/13 1559 08/12/13 0534  AMMONIA 121* 174* 121*   CBC:  Recent Labs Lab 08/09/13 1830 08/10/13 0519 08/12/13 0532  WBC 4.6 3.6* 3.9*  HGB 12.5* 10.5* 10.9*  HCT 35.3* 30.3* 31.7*  MCV 89.6 90.4 90.8  PLT 65* 46* 55*   Cardiac Enzymes: No results found for this basename: CKTOTAL, CKMB, CKMBINDEX, TROPONINI,  in the last 168 hours BNP: BNP (last 3 results) No results found for this basename: PROBNP,  in the last 8760 hours CBG:  Recent Labs Lab 08/11/13 1202 08/11/13 1621 08/11/13 2136 08/12/13 0717 08/12/13 1127  GLUCAP 233* 153* 172* 150* 175*       Signed:  Burech Mcfarland  [facility] 08/12/2013, 6:02 PM

## 2013-08-19 ENCOUNTER — Encounter (HOSPITAL_COMMUNITY): Payer: Self-pay | Admitting: Emergency Medicine

## 2013-08-19 ENCOUNTER — Emergency Department (HOSPITAL_COMMUNITY)
Admission: EM | Admit: 2013-08-19 | Discharge: 2013-08-19 | Disposition: A | Discharge status: Home / Self Care | Payer: Medicare PPO | Attending: Emergency Medicine | Admitting: Emergency Medicine

## 2013-08-19 DIAGNOSIS — D649 Anemia, unspecified: Secondary | ICD-10-CM | POA: Insufficient documentation

## 2013-08-19 DIAGNOSIS — M25519 Pain in unspecified shoulder: Secondary | ICD-10-CM | POA: Insufficient documentation

## 2013-08-19 DIAGNOSIS — G589 Mononeuropathy, unspecified: Secondary | ICD-10-CM | POA: Insufficient documentation

## 2013-08-19 DIAGNOSIS — Z8659 Personal history of other mental and behavioral disorders: Secondary | ICD-10-CM | POA: Insufficient documentation

## 2013-08-19 DIAGNOSIS — Z79899 Other long term (current) drug therapy: Secondary | ICD-10-CM | POA: Insufficient documentation

## 2013-08-19 DIAGNOSIS — J4489 Other specified chronic obstructive pulmonary disease: Secondary | ICD-10-CM | POA: Insufficient documentation

## 2013-08-19 DIAGNOSIS — M545 Low back pain, unspecified: Secondary | ICD-10-CM | POA: Insufficient documentation

## 2013-08-19 DIAGNOSIS — Z792 Long term (current) use of antibiotics: Secondary | ICD-10-CM | POA: Insufficient documentation

## 2013-08-19 DIAGNOSIS — M25512 Pain in left shoulder: Secondary | ICD-10-CM

## 2013-08-19 DIAGNOSIS — K219 Gastro-esophageal reflux disease without esophagitis: Secondary | ICD-10-CM | POA: Insufficient documentation

## 2013-08-19 DIAGNOSIS — Z8673 Personal history of transient ischemic attack (TIA), and cerebral infarction without residual deficits: Secondary | ICD-10-CM | POA: Insufficient documentation

## 2013-08-19 DIAGNOSIS — F17200 Nicotine dependence, unspecified, uncomplicated: Secondary | ICD-10-CM | POA: Insufficient documentation

## 2013-08-19 DIAGNOSIS — G8929 Other chronic pain: Secondary | ICD-10-CM | POA: Insufficient documentation

## 2013-08-19 DIAGNOSIS — Z794 Long term (current) use of insulin: Secondary | ICD-10-CM | POA: Insufficient documentation

## 2013-08-19 DIAGNOSIS — J449 Chronic obstructive pulmonary disease, unspecified: Secondary | ICD-10-CM | POA: Insufficient documentation

## 2013-08-19 DIAGNOSIS — M129 Arthropathy, unspecified: Secondary | ICD-10-CM | POA: Insufficient documentation

## 2013-08-19 DIAGNOSIS — E119 Type 2 diabetes mellitus without complications: Secondary | ICD-10-CM | POA: Insufficient documentation

## 2013-08-19 MED ORDER — HYDROCODONE-ACETAMINOPHEN 5-325 MG PO TABS
2.0000 | ORAL_TABLET | Freq: Once | ORAL | Status: AC
  Administered 2013-08-19: 2 via ORAL
  Filled 2013-08-19: qty 2

## 2013-08-19 NOTE — Discharge Instructions (Signed)
If you were given medicines take as directed.  If you are on coumadin or contraceptives realize their levels and effectiveness is altered by many different medicines.  If you have any reaction (rash, tongues swelling, other) to the medicines stop taking and see a physician.   Please follow up as directed and return to the ER or see a physician for new or worsening symptoms.  Thank you. Filed Vitals:   08/19/13 0215 08/19/13 0230 08/19/13 0245 08/19/13 0300  BP: 141/63 136/63 140/62 125/73  Pulse: 77 77 75 78  Temp:      Resp:      Height:      Weight:      SpO2: 99% 98% 98% 100%

## 2013-08-19 NOTE — ED Provider Notes (Signed)
CSN: [id number]     Arrival date & time 08/19/13  0052 History   First MD Initiated Contact with Patient 08/19/13 0200     Chief Complaint  Patient presents with  . Back Pain     (Consider location/radiation/quality/duration/timing/severity/associated sxs/prior Treatment) HPI Comments: 58 year old male with cirrhosis, alcohol abuse, GI bleeding, high blood pressure, gastric ulcer, alcohol dependence, chronic pain on daily narcotics presents with persistent left shoulder and lower back pain for weeks. The patient admits that he is here for a refill of his prescription. The pain is similar to previous and no new injuries or new concerns. Patient has been on Vicodin for this pain and has a followup appointment on Wednesday to discuss refill with his primary provider. No new weakness numbness or tingling in his arms or legs, no fevers.  Pain worse with range of motion of left shoulder  Patient is a 58 y.o. male presenting with back pain. The history is provided by the patient.  Back Pain Associated symptoms: no abdominal pain, no chest pain, no fever, no headaches, no numbness and no weakness     Past Medical History  Diagnosis Date  . Neuropathy   . Diabetes mellitus   . Bipolar affect, depressed   . Hypertension   . Arthritis   . Stroke     Mini stroke about 61yrs ago  . Cirrhosis   . Alcohol abuse   . Chronic pain   . Cocaine abuse   . Muscle spasm     both legs  . Encephalopathy, hepatic   . Detached retina   . COPD (chronic obstructive pulmonary disease)     emphysema  . Bronchitis   . Barrett's esophagus   . GERD (gastroesophageal reflux disease)     has ulcer  . Anemia    Past Surgical History  Procedure Laterality Date  . Fracture surgery      Leg and arm 33yrs ago  . Esophagogastroduodenoscopy  04/04/2012    Procedure: ESOPHAGOGASTRODUODENOSCOPY (EGD);  Surgeon: Irene Shipper, MD;  Location: [facility] ENDOSCOPY;  Service: Endoscopy;  Laterality: N/A;  .  Esophagogastroduodenoscopy Left 03/13/2013    Procedure: ESOPHAGOGASTRODUODENOSCOPY (EGD);  Surgeon: Arta Silence, MD;  Location: [facility] ENDOSCOPY;  Service: Endoscopy;  Laterality: Left;  Marland Kitchen Eye surgery  8 months ago both eyes    cataracts both eyes, detached eye, gas pocket  . Vasectomy    . Pars plana vitrectomy Left 07/08/2013    Procedure: PARS PLANA VITRECTOMY WITH 25 GAUGE;  Surgeon: Hurman Horn, MD;  Location: [place];  Service: Ophthalmology;  Laterality: Left;  . Intraocular lens removal Left 07/08/2013    Procedure: REMOVAL OF INTRAOCULAR LENS;  Surgeon: Hurman Horn, MD;  Location: [place];  Service: Ophthalmology;  Laterality: Left;  . Placement and suture of secondary intraocular lens Left 07/08/2013    Procedure: PLACEMENT AND SUTURE OF SECONDARY INTRAOCULAR LENS;  Surgeon: Hurman Horn, MD;  Location: [place];  Service: Ophthalmology;  Laterality: Left;  Insertion of Anterior Capsule Intraocular Lens    Family History  Problem Relation Age of Onset  . Hypotension Mother    History  Substance Use Topics  . Smoking status: Current Every Day Smoker -- 1.00 packs/day for 30 years    Types: Cigarettes    Last Attempt to Quit: 04/06/2012  . Smokeless tobacco: Never Used     Comment: quit   . Alcohol Use: 0.0 oz/week     Comment: 12 pk beer daily  06/2013 -  no alcohol since 11/2012    Review of Systems  Constitutional: Negative for fever and chills.  Eyes: Negative for visual disturbance.  Respiratory: Negative for shortness of breath.   Cardiovascular: Negative for chest pain.  Gastrointestinal: Negative for vomiting and abdominal pain.  Musculoskeletal: Positive for arthralgias and back pain. Negative for neck pain and neck stiffness.  Skin: Negative for rash.  Neurological: Negative for weakness, light-headedness, numbness and headaches.      Allergies  Review of patient's allergies indicates no known allergies.  Home Medications   Current Outpatient Rx  Name  Route   Sig  Dispense  Refill  . ALPRAZolam (XANAX) 1 MG tablet   Oral   Take 1 tablet (1 mg total) by mouth 2 (two) times daily as needed for anxiety.   20 tablet   0   . amitriptyline (ELAVIL) 50 MG tablet   Oral   Take 50 mg by mouth at bedtime as needed for sleep.         Marland Kitchen diclofenac sodium (VOLTAREN) 1 % GEL   Topical   Apply 1 application topically daily as needed (back and leg pain).         . folic acid (FOLVITE) 1 MG tablet   Oral   Take 1 tablet (1 mg total) by mouth daily.   30 tablet   0   . HYDROcodone-acetaminophen (NORCO/VICODIN) 5-325 MG per tablet   Oral   Take 1 tablet by mouth every 6 (six) hours as needed for moderate pain.         . Insulin Glargine (LANTUS SOLOSTAR) 100 UNIT/ML SOPN   Subcutaneous   Inject 45 Units into the skin at bedtime.          Marland Kitchen lactulose (CHRONULAC) 10 GM/15ML solution   Oral   Take 45 mLs (30 g total) by mouth 4 (four) times daily. Titrate to at least 3 BMs daily   240 mL   2   . lisinopril (PRINIVIL,ZESTRIL) 20 MG tablet   Oral   Take 20 mg by mouth daily.         . pantoprazole (PROTONIX) 40 MG tablet   Oral   Take 1 tablet (40 mg total) by mouth 2 (two) times daily.   60 tablet   3   . propranolol (INDERAL) 20 MG tablet   Oral   Take 1 tablet (20 mg total) by mouth 2 (two) times daily.   60 tablet   0   . rifaximin (XIFAXAN) 550 MG TABS tablet   Oral   Take 550 mg by mouth 2 (two) times daily.         Marland Kitchen spironolactone (ALDACTONE) 100 MG tablet   Oral   Take 1 tablet (100 mg total) by mouth daily.   30 tablet   0    BP 125/73  Pulse 78  Temp(Src) 97.7 F (36.5 C)  Resp 20  Ht 5\' 11"  (1.803 m)  Wt 215 lb (97.523 kg)  BMI 30.00 kg/m2  SpO2 100% Physical Exam  Nursing note and vitals reviewed. Constitutional: He is oriented to person, place, and time. He appears well-developed and well-nourished.  HENT:  Head: Normocephalic and atraumatic.  Eyes: Conjunctivae are normal. Right eye exhibits  no discharge. Left eye exhibits no discharge.  Neck: Normal range of motion. Neck supple. No tracheal deviation present.  Cardiovascular: Normal rate and regular rhythm.   Pulmonary/Chest: Effort normal and breath sounds normal.  Abdominal: Soft. He exhibits no distension.  There is no tenderness. There is no guarding.  Musculoskeletal: He exhibits tenderness. He exhibits no edema.  Mild tender left anterior shoulder with palpation and abduction, full rom with pain.  nv intact distal 5+ strength at major joints UE with F/ E  Neurological: He is alert and oriented to person, place, and time. GCS eye subscore is 4. GCS verbal subscore is 5. GCS motor subscore is 6.  Reflex Scores:      Bicep reflexes are 2+ on the right side and 2+ on the left side. 5+ strength in UE and LE with f/e at major joints. Sensation to palpation intact in UE and LE. CNs 2-12 grossly intact.  EOMFI.  PERRL.      Skin: Skin is warm. No rash noted.  Psychiatric: He has a normal mood and affect.    ED Course  Procedures (including critical care time) Labs Review Labs Reviewed - No data to display Imaging Review No results found.   EKG Interpretation None      MDM   Final diagnoses:  None  Left shoulder pain Chronic back pain  Long discussion with patient in regards to close followup to discuss pain medicine refills. 2 Norco given in ED. Patient has a ride home. Discussed that I would not refill his chronic pain prescription. Patient comfortable and understands my thought process. Normal neuro examination the. No concern for cardiac at this time. No urinary or bowel changes.   Results and differential diagnosis were discussed with the patient. Close follow up outpatient was discussed, patient comfortable with the plan.   Filed Vitals:   08/19/13 0215 08/19/13 0230 08/19/13 0245 08/19/13 0300  BP: 141/63 136/63 140/62 125/73  Pulse: 77 77 75 78  Temp:      Resp:      Height:      Weight:       SpO2: 99% 98% 98% 100%         Mariea Clonts, MD 08/19/13 [phone]

## 2013-08-19 NOTE — ED Notes (Signed)
The pt is here with pain in his lower back and both shoulders for one week.  He mumbled something about withdrawal but then he reports that he is here for pain

## 2013-08-28 ENCOUNTER — Emergency Department (HOSPITAL_COMMUNITY): Payer: Medicare PPO

## 2013-08-28 ENCOUNTER — Encounter (HOSPITAL_COMMUNITY): Payer: Self-pay | Admitting: Emergency Medicine

## 2013-08-28 ENCOUNTER — Inpatient Hospital Stay (HOSPITAL_COMMUNITY)
Admission: EM | Admit: 2013-08-28 | Discharge: 2013-09-05 | DRG: 442 | Disposition: A | Discharge status: Skilled Nursing Facility | Payer: Medicare PPO | Attending: Internal Medicine | Admitting: Internal Medicine

## 2013-08-28 DIAGNOSIS — K7682 Hepatic encephalopathy: Principal | ICD-10-CM

## 2013-08-28 DIAGNOSIS — D61818 Other pancytopenia: Secondary | ICD-10-CM

## 2013-08-28 DIAGNOSIS — K7290 Hepatic failure, unspecified without coma: Secondary | ICD-10-CM

## 2013-08-28 DIAGNOSIS — F329 Major depressive disorder, single episode, unspecified: Secondary | ICD-10-CM

## 2013-08-28 DIAGNOSIS — F32A Depression, unspecified: Secondary | ICD-10-CM

## 2013-08-28 DIAGNOSIS — E1149 Type 2 diabetes mellitus with other diabetic neurological complication: Secondary | ICD-10-CM | POA: Diagnosis present

## 2013-08-28 DIAGNOSIS — R627 Adult failure to thrive: Secondary | ICD-10-CM | POA: Diagnosis present

## 2013-08-28 DIAGNOSIS — R4182 Altered mental status, unspecified: Secondary | ICD-10-CM

## 2013-08-28 DIAGNOSIS — K219 Gastro-esophageal reflux disease without esophagitis: Secondary | ICD-10-CM | POA: Diagnosis present

## 2013-08-28 DIAGNOSIS — G9340 Encephalopathy, unspecified: Secondary | ICD-10-CM

## 2013-08-28 DIAGNOSIS — L03119 Cellulitis of unspecified part of limb: Secondary | ICD-10-CM

## 2013-08-28 DIAGNOSIS — Z794 Long term (current) use of insulin: Secondary | ICD-10-CM

## 2013-08-28 DIAGNOSIS — F1010 Alcohol abuse, uncomplicated: Secondary | ICD-10-CM

## 2013-08-28 DIAGNOSIS — Z79899 Other long term (current) drug therapy: Secondary | ICD-10-CM

## 2013-08-28 DIAGNOSIS — K7030 Alcoholic cirrhosis of liver without ascites: Secondary | ICD-10-CM | POA: Diagnosis present

## 2013-08-28 DIAGNOSIS — R531 Weakness: Secondary | ICD-10-CM

## 2013-08-28 DIAGNOSIS — F17200 Nicotine dependence, unspecified, uncomplicated: Secondary | ICD-10-CM | POA: Diagnosis present

## 2013-08-28 DIAGNOSIS — E119 Type 2 diabetes mellitus without complications: Secondary | ICD-10-CM

## 2013-08-28 DIAGNOSIS — J438 Other emphysema: Secondary | ICD-10-CM | POA: Diagnosis present

## 2013-08-28 DIAGNOSIS — N179 Acute kidney failure, unspecified: Secondary | ICD-10-CM | POA: Diagnosis present

## 2013-08-28 DIAGNOSIS — R296 Repeated falls: Secondary | ICD-10-CM

## 2013-08-28 DIAGNOSIS — Z8673 Personal history of transient ischemic attack (TIA), and cerebral infarction without residual deficits: Secondary | ICD-10-CM

## 2013-08-28 DIAGNOSIS — Z6828 Body mass index (BMI) 28.0-28.9, adult: Secondary | ICD-10-CM

## 2013-08-28 DIAGNOSIS — D696 Thrombocytopenia, unspecified: Secondary | ICD-10-CM

## 2013-08-28 DIAGNOSIS — F3130 Bipolar disorder, current episode depressed, mild or moderate severity, unspecified: Secondary | ICD-10-CM

## 2013-08-28 DIAGNOSIS — R197 Diarrhea, unspecified: Secondary | ICD-10-CM | POA: Diagnosis present

## 2013-08-28 DIAGNOSIS — W19XXXA Unspecified fall, initial encounter: Secondary | ICD-10-CM

## 2013-08-28 DIAGNOSIS — F1020 Alcohol dependence, uncomplicated: Secondary | ICD-10-CM

## 2013-08-28 DIAGNOSIS — Z720 Tobacco use: Secondary | ICD-10-CM

## 2013-08-28 DIAGNOSIS — E7220 Disorder of urea cycle metabolism, unspecified: Secondary | ICD-10-CM

## 2013-08-28 DIAGNOSIS — K259 Gastric ulcer, unspecified as acute or chronic, without hemorrhage or perforation: Secondary | ICD-10-CM

## 2013-08-28 DIAGNOSIS — E440 Moderate protein-calorie malnutrition: Secondary | ICD-10-CM | POA: Diagnosis present

## 2013-08-28 DIAGNOSIS — E1142 Type 2 diabetes mellitus with diabetic polyneuropathy: Secondary | ICD-10-CM | POA: Diagnosis present

## 2013-08-28 DIAGNOSIS — G8929 Other chronic pain: Secondary | ICD-10-CM | POA: Diagnosis present

## 2013-08-28 DIAGNOSIS — K922 Gastrointestinal hemorrhage, unspecified: Secondary | ICD-10-CM

## 2013-08-28 DIAGNOSIS — I10 Essential (primary) hypertension: Secondary | ICD-10-CM

## 2013-08-28 DIAGNOSIS — K7460 Unspecified cirrhosis of liver: Secondary | ICD-10-CM

## 2013-08-28 DIAGNOSIS — K2270 Barrett's esophagus without dysplasia: Secondary | ICD-10-CM | POA: Diagnosis present

## 2013-08-28 DIAGNOSIS — M129 Arthropathy, unspecified: Secondary | ICD-10-CM | POA: Diagnosis present

## 2013-08-28 LAB — URINALYSIS, ROUTINE W REFLEX MICROSCOPIC
Bilirubin Urine: NEGATIVE
Glucose, UA: NEGATIVE mg/dL
Hgb urine dipstick: NEGATIVE
KETONES UR: NEGATIVE mg/dL
Leukocytes, UA: NEGATIVE
NITRITE: NEGATIVE
Protein, ur: NEGATIVE mg/dL
SPECIFIC GRAVITY, URINE: 1.016 (ref 1.005–1.030)
UROBILINOGEN UA: 1 mg/dL (ref 0.0–1.0)
pH: 5.5 (ref 5.0–8.0)

## 2013-08-28 LAB — COMPREHENSIVE METABOLIC PANEL
ALT: 23 U/L (ref 0–53)
AST: 43 U/L — ABNORMAL HIGH (ref 0–37)
Albumin: 3.1 g/dL — ABNORMAL LOW (ref 3.5–5.2)
Alkaline Phosphatase: 87 U/L (ref 39–117)
BUN: 22 mg/dL (ref 6–23)
CO2: 20 mEq/L (ref 19–32)
Calcium: 9.1 mg/dL (ref 8.4–10.5)
Chloride: 107 mEq/L (ref 96–112)
Creatinine, Ser: 1.73 mg/dL — ABNORMAL HIGH (ref 0.50–1.35)
GFR calc Af Amer: 49 mL/min — ABNORMAL LOW (ref >90)
GFR calc non Af Amer: 42 mL/min — ABNORMAL LOW (ref >90)
Glucose, Bld: 159 mg/dL — ABNORMAL HIGH (ref 70–99)
Potassium: 5.1 mEq/L (ref 3.7–5.3)
Sodium: 140 mEq/L (ref 137–147)
Total Bilirubin: 1.9 mg/dL — ABNORMAL HIGH (ref 0.3–1.2)
Total Protein: 6.7 g/dL (ref 6.0–8.3)

## 2013-08-28 LAB — SALICYLATE LEVEL: Salicylate Lvl: <2 mg/dL — ABNORMAL LOW (ref 2.8–20.0)

## 2013-08-28 LAB — GLUCOSE, CAPILLARY: Glucose-Capillary: 119 mg/dL — ABNORMAL HIGH (ref 70–99)

## 2013-08-28 LAB — RAPID URINE DRUG SCREEN, HOSP PERFORMED
Amphetamines: NOT DETECTED
Barbiturates: POSITIVE — AB
Benzodiazepines: POSITIVE — AB
Cocaine: NOT DETECTED
Opiates: POSITIVE — AB
Tetrahydrocannabinol: NOT DETECTED

## 2013-08-28 LAB — APTT: aPTT: 43 seconds — ABNORMAL HIGH (ref 24–37)

## 2013-08-28 LAB — CBC WITH DIFFERENTIAL/PLATELET
Basophils Absolute: 0 10*3/uL (ref 0.0–0.1)
Basophils Relative: 1 % (ref 0–1)
Eosinophils Absolute: 0.3 10*3/uL (ref 0.0–0.7)
Eosinophils Relative: 10 % — ABNORMAL HIGH (ref 0–5)
HCT: 32.1 % — ABNORMAL LOW (ref 39.0–52.0)
Hemoglobin: 11.3 g/dL — ABNORMAL LOW (ref 13.0–17.0)
Lymphocytes Relative: 31 % (ref 12–46)
Lymphs Abs: 0.9 10*3/uL (ref 0.7–4.0)
MCH: 31.7 pg (ref 26.0–34.0)
MCHC: 35.2 g/dL (ref 30.0–36.0)
MCV: 90.2 fL (ref 78.0–100.0)
Monocytes Absolute: 0.3 10*3/uL (ref 0.1–1.0)
Monocytes Relative: 11 % (ref 3–12)
Neutro Abs: 1.3 10*3/uL — ABNORMAL LOW (ref 1.7–7.7)
Neutrophils Relative %: 48 % (ref 43–77)
Platelets: 52 10*3/uL — ABNORMAL LOW (ref 150–400)
RBC: 3.56 MIL/uL — ABNORMAL LOW (ref 4.22–5.81)
RDW: 16.4 % — ABNORMAL HIGH (ref 11.5–15.5)
WBC: 2.8 10*3/uL — ABNORMAL LOW (ref 4.0–10.5)

## 2013-08-28 LAB — PROTIME-INR
INR: 1.29 (ref 0.00–1.49)
PROTHROMBIN TIME: 15.8 s — AB (ref 11.6–15.2)

## 2013-08-28 LAB — ETHANOL: Alcohol, Ethyl (B): <11 mg/dL (ref 0–11)

## 2013-08-28 LAB — ACETAMINOPHEN LEVEL: Acetaminophen (Tylenol), Serum: <15 ug/mL (ref 10–30)

## 2013-08-28 LAB — AMMONIA: Ammonia: 125 umol/L — ABNORMAL HIGH (ref 11–60)

## 2013-08-28 MED ORDER — INSULIN ASPART 100 UNIT/ML ~~LOC~~ SOLN
4.0000 [IU] | Freq: Three times a day (TID) | SUBCUTANEOUS | Status: DC
  Administered 2013-08-29 – 2013-09-05 (×19): 4 [IU] via SUBCUTANEOUS

## 2013-08-28 MED ORDER — MORPHINE SULFATE 2 MG/ML IJ SOLN
0.5000 mg | INTRAMUSCULAR | Status: DC | PRN
  Administered 2013-08-29 – 2013-08-31 (×7): 0.5 mg via INTRAVENOUS
  Filled 2013-08-28 (×7): qty 1

## 2013-08-28 MED ORDER — LACTULOSE 10 GM/15ML PO SOLN
30.0000 g | Freq: Four times a day (QID) | ORAL | Status: DC
  Administered 2013-08-28: 30 g via ORAL
  Filled 2013-08-28 (×2): qty 45

## 2013-08-28 MED ORDER — SPIRONOLACTONE 100 MG PO TABS
100.0000 mg | ORAL_TABLET | Freq: Every day | ORAL | Status: DC
  Administered 2013-08-28 – 2013-09-05 (×9): 100 mg via ORAL
  Filled 2013-08-28 (×9): qty 1

## 2013-08-28 MED ORDER — ONDANSETRON HCL 4 MG PO TABS
4.0000 mg | ORAL_TABLET | Freq: Four times a day (QID) | ORAL | Status: DC | PRN
  Administered 2013-08-31 – 2013-09-01 (×2): 4 mg via ORAL
  Filled 2013-08-28 (×2): qty 1

## 2013-08-28 MED ORDER — ALPRAZOLAM 1 MG PO TABS
1.0000 mg | ORAL_TABLET | Freq: Two times a day (BID) | ORAL | Status: DC | PRN
  Administered 2013-08-29 – 2013-08-31 (×5): 1 mg via ORAL
  Filled 2013-08-28 (×6): qty 1

## 2013-08-28 MED ORDER — RIFAXIMIN 550 MG PO TABS
550.0000 mg | ORAL_TABLET | Freq: Two times a day (BID) | ORAL | Status: DC
  Administered 2013-08-28 – 2013-09-05 (×16): 550 mg via ORAL
  Filled 2013-08-28 (×17): qty 1

## 2013-08-28 MED ORDER — INSULIN ASPART 100 UNIT/ML ~~LOC~~ SOLN
0.0000 [IU] | Freq: Three times a day (TID) | SUBCUTANEOUS | Status: DC
  Administered 2013-08-29 – 2013-08-30 (×3): 3 [IU] via SUBCUTANEOUS
  Administered 2013-08-30 – 2013-08-31 (×2): 2 [IU] via SUBCUTANEOUS
  Administered 2013-09-01 – 2013-09-02 (×3): 3 [IU] via SUBCUTANEOUS
  Administered 2013-09-02: 2 [IU] via SUBCUTANEOUS
  Administered 2013-09-03: 3 [IU] via SUBCUTANEOUS
  Administered 2013-09-03: 2 [IU] via SUBCUTANEOUS
  Administered 2013-09-04: 5 [IU] via SUBCUTANEOUS
  Administered 2013-09-04: 2 [IU] via SUBCUTANEOUS
  Administered 2013-09-04: 1 [IU] via SUBCUTANEOUS
  Administered 2013-09-05: 3 [IU] via SUBCUTANEOUS
  Administered 2013-09-05: 5 [IU] via SUBCUTANEOUS

## 2013-08-28 MED ORDER — LISINOPRIL 20 MG PO TABS
20.0000 mg | ORAL_TABLET | Freq: Every day | ORAL | Status: DC
  Administered 2013-08-28 – 2013-09-05 (×8): 20 mg via ORAL
  Filled 2013-08-28 (×9): qty 1

## 2013-08-28 MED ORDER — FOLIC ACID 1 MG PO TABS
1.0000 mg | ORAL_TABLET | Freq: Every day | ORAL | Status: DC
  Administered 2013-08-28 – 2013-09-05 (×9): 1 mg via ORAL
  Filled 2013-08-28 (×9): qty 1

## 2013-08-28 MED ORDER — ONDANSETRON HCL 4 MG/2ML IJ SOLN: 4.0000 mg | Freq: Four times a day (QID) | INTRAMUSCULAR | Status: DC | PRN

## 2013-08-28 MED ORDER — SODIUM CHLORIDE 0.9 % IV SOLN
INTRAVENOUS | Status: DC
  Administered 2013-08-28: 21:00:00 via INTRAVENOUS

## 2013-08-28 MED ORDER — PROPRANOLOL HCL 20 MG PO TABS
20.0000 mg | ORAL_TABLET | Freq: Two times a day (BID) | ORAL | Status: DC
Start: 2013-08-28 — End: 2013-09-05
  Administered 2013-08-28 – 2013-09-05 (×14): 20 mg via ORAL
  Filled 2013-08-28 (×17): qty 1

## 2013-08-28 MED ORDER — INSULIN GLARGINE 100 UNIT/ML ~~LOC~~ SOLN
45.0000 [IU] | Freq: Every day | SUBCUTANEOUS | Status: DC
  Administered 2013-08-28 – 2013-09-04 (×8): 45 [IU] via SUBCUTANEOUS
  Filled 2013-08-28 (×9): qty 0.45

## 2013-08-28 MED ORDER — PANTOPRAZOLE SODIUM 40 MG PO TBEC
40.0000 mg | DELAYED_RELEASE_TABLET | Freq: Two times a day (BID) | ORAL | Status: DC
  Administered 2013-08-28 – 2013-09-05 (×16): 40 mg via ORAL
  Filled 2013-08-28 (×18): qty 1

## 2013-08-28 NOTE — ED Provider Notes (Signed)
Medical screening examination/treatment/procedure(s) were performed by non-physician practitioner and as supervising physician I was immediately available for consultation/collaboration.   EKG Interpretation   Date/Time:  Wednesday August 28 2013 13:56:54 EDT Ventricular Rate:  59 PR Interval:  167 QRS Duration: 92 QT Interval:  456 QTC Calculation: 452 R Axis:   24 Text Interpretation:  Sinus rhythm Abnormal R-wave progression, early  transition No significant change since last tracing Confirmed by Hardesty   MD-I, IVA ([id number]) on 08/28/2013 3:45:33 PM        [place], DO 08/28/13 2349

## 2013-08-28 NOTE — ED Provider Notes (Signed)
CSN: [id number]     Arrival date & time 08/28/13  1328 History   First MD Initiated Contact with Patient 08/28/13 1335     Chief Complaint  Patient presents with  . Failure To Thrive     (Consider location/radiation/quality/duration/timing/severity/associated sxs/prior Treatment) HPI Comments: Patient presents to the ED with a chief complaint of AMS.  Reportedly, the patient has been having steady decline in mentation and ability to take care of himself.  Patient denies being in any pain at this time.  He states that he does not know why he is here.  History is limited secondary to AMS.  Level 5 caveat applies.  The history is provided by the patient. No language interpreter was used.    Past Medical History  Diagnosis Date  . Neuropathy   . Diabetes mellitus   . Bipolar affect, depressed   . Hypertension   . Arthritis   . Stroke     Mini stroke about 14yrs ago  . Cirrhosis   . Alcohol abuse   . Chronic pain   . Cocaine abuse   . Muscle spasm     both legs  . Encephalopathy, hepatic   . Detached retina   . COPD (chronic obstructive pulmonary disease)     emphysema  . Bronchitis   . Barrett's esophagus   . GERD (gastroesophageal reflux disease)     has ulcer  . Anemia    Past Surgical History  Procedure Laterality Date  . Fracture surgery      Leg and arm 52yrs ago  . Esophagogastroduodenoscopy  04/04/2012    Procedure: ESOPHAGOGASTRODUODENOSCOPY (EGD);  Surgeon: Hilarie Fredrickson, MD;  Location: [facility] ENDOSCOPY;  Service: Endoscopy;  Laterality: N/A;  . Esophagogastroduodenoscopy Left 03/13/2013    Procedure: ESOPHAGOGASTRODUODENOSCOPY (EGD);  Surgeon: Willis Modena, MD;  Location: [facility] ENDOSCOPY;  Service: Endoscopy;  Laterality: Left;  Marland Kitchen Eye surgery  8 months ago both eyes    cataracts both eyes, detached eye, gas pocket  . Vasectomy    . Pars plana vitrectomy Left 07/08/2013    Procedure: PARS PLANA VITRECTOMY WITH 25 GAUGE;  Surgeon: Edmon Crape, MD;  Location: [facility] OR;   Service: Ophthalmology;  Laterality: Left;  . Intraocular lens removal Left 07/08/2013    Procedure: REMOVAL OF INTRAOCULAR LENS;  Surgeon: Edmon Crape, MD;  Location: [facility] OR;  Service: Ophthalmology;  Laterality: Left;  . Placement and suture of secondary intraocular lens Left 07/08/2013    Procedure: PLACEMENT AND SUTURE OF SECONDARY INTRAOCULAR LENS;  Surgeon: Edmon Crape, MD;  Location: [facility] OR;  Service: Ophthalmology;  Laterality: Left;  Insertion of Anterior Capsule Intraocular Lens    Family History  Problem Relation Age of Onset  . Hypotension Mother    History  Substance Use Topics  . Smoking status: Current Every Day Smoker -- 1.00 packs/day for 30 years    Types: Cigarettes    Last Attempt to Quit: 04/06/2012  . Smokeless tobacco: Never Used     Comment: quit   . Alcohol Use: 0.0 oz/week     Comment: 12 pk beer daily  06/2013 - no alcohol since 11/2012    Review of Systems  Unable to perform ROS: Mental status change      Allergies  Review of patient's allergies indicates no known allergies.  Home Medications   Current Outpatient Rx  Name  Route  Sig  Dispense  Refill  . ALPRAZolam (XANAX) 1 MG tablet   Oral  Take 1 tablet (1 mg total) by mouth 2 (two) times daily as needed for anxiety.   20 tablet   0   . diclofenac sodium (VOLTAREN) 1 % GEL   Topical   Apply 1 application topically daily as needed (back and leg pain).         . folic acid (FOLVITE) 1 MG tablet   Oral   Take 1 tablet (1 mg total) by mouth daily.   30 tablet   0   . HYDROcodone-acetaminophen (NORCO/VICODIN) 5-325 MG per tablet   Oral   Take 1 tablet by mouth every 6 (six) hours as needed for moderate pain.         Marland Kitchen insulin aspart (NOVOLOG) 100 UNIT/ML FlexPen   Subcutaneous   Inject 6 Units into the skin 3 (three) times daily as needed for high blood sugar.         . Insulin Glargine (LANTUS SOLOSTAR) 100 UNIT/ML SOPN   Subcutaneous   Inject 45 Units into the skin at  bedtime.          Marland Kitchen lactulose (CHRONULAC) 10 GM/15ML solution   Oral   Take 45 mLs (30 g total) by mouth 4 (four) times daily. Titrate to at least 3 BMs daily   240 mL   2   . lisinopril (PRINIVIL,ZESTRIL) 20 MG tablet   Oral   Take 20 mg by mouth daily.         . pantoprazole (PROTONIX) 40 MG tablet   Oral   Take 1 tablet (40 mg total) by mouth 2 (two) times daily.   60 tablet   3   . propranolol (INDERAL) 20 MG tablet   Oral   Take 1 tablet (20 mg total) by mouth 2 (two) times daily.   60 tablet   0   . rifaximin (XIFAXAN) 550 MG TABS tablet   Oral   Take 550 mg by mouth 2 (two) times daily.         Marland Kitchen spironolactone (ALDACTONE) 100 MG tablet   Oral   Take 1 tablet (100 mg total) by mouth daily.   30 tablet   0    BP 125/58  Pulse 58  Resp 30  SpO2 98% Physical Exam  Nursing note and vitals reviewed. Constitutional: He is oriented to person, place, and time. He appears well-developed and well-nourished.  HENT:  Head: Normocephalic and atraumatic.  Dry mucous membranes  Eyes: Conjunctivae and EOM are normal. Pupils are equal, round, and reactive to light. Right eye exhibits no discharge. Left eye exhibits no discharge. No scleral icterus.  Neck: Normal range of motion. Neck supple. No JVD present.  Cardiovascular: Normal rate, regular rhythm and normal heart sounds.  Exam reveals no gallop and no friction rub.   No murmur heard. Pulmonary/Chest: Effort normal and breath sounds normal. No respiratory distress. He has no wheezes. He has no rales. He exhibits no tenderness.  CTAB  Abdominal: Soft. He exhibits no distension and no mass. There is no tenderness. There is no rebound and no guarding.  Some discomfort with abdominal palpation, but no true tenderness, no RLQ tenderness or pain at McBurney's point, no RUQ tenderness or Murphy's sign, no left-sided abdominal tenderness, no fluid wave, or signs of peritonitis   Musculoskeletal: Normal range of motion.  He exhibits no edema and no tenderness.  Neurological: He is alert and oriented to person, place, and time.  Skin: Skin is warm and dry.  Psychiatric: He has a  normal mood and affect. His behavior is normal. Judgment and thought content normal.    ED Course  Procedures (including critical care time) Labs Review Labs Reviewed  CBC WITH DIFFERENTIAL - Abnormal; Notable for the following:    WBC 2.8 (*)    RBC 3.56 (*)    Hemoglobin 11.3 (*)    HCT 32.1 (*)    RDW 16.4 (*)    Platelets 52 (*)    Neutro Abs 1.3 (*)    Eosinophils Relative 10 (*)    All other components within normal limits  COMPREHENSIVE METABOLIC PANEL - Abnormal; Notable for the following:    Glucose, Bld 159 (*)    Creatinine, Ser 1.73 (*)    Albumin 3.1 (*)    AST 43 (*)    Total Bilirubin 1.9 (*)    GFR calc non Af Amer 42 (*)    GFR calc Af Amer 49 (*)    All other components within normal limits  SALICYLATE LEVEL - Abnormal; Notable for the following:    Salicylate Lvl <1.7 (*)    All other components within normal limits  ACETAMINOPHEN LEVEL  URINE RAPID DRUG SCREEN (HOSP PERFORMED)  AMMONIA  URINALYSIS, ROUTINE W REFLEX MICROSCOPIC  APTT  PROTIME-INR  ETHANOL   Imaging Review Ct Head Wo Contrast  08/28/2013   CLINICAL DATA:  Failure to thrive. Altered mental status. Decline in mental status and ability to take care of himself.  EXAM: CT HEAD WITHOUT CONTRAST  CT CERVICAL SPINE WITHOUT CONTRAST  TECHNIQUE: Multidetector CT imaging of the head and cervical spine was performed following the standard protocol without intravenous contrast. Multiplanar CT image reconstructions of the cervical spine were also generated.  COMPARISON:  CT head and cervical spine 04/10/2013  FINDINGS: CT HEAD FINDINGS  Mild generalized atrophy and white matter disease is present. No acute cortical infarct, hemorrhage, or mass lesion is present. The ventricles are proportionate to the degree of atrophy. No significant extra-axial  fluid collection is present. Study is mildly degraded by patient motion.  The paranasal sinuses and mastoid air cells are clear. The osseous skull is intact.  CT CERVICAL SPINE FINDINGS  The cervical spine is imaged and skull base through T1-2. Vertebral body heights alignment are maintained. Minimal endplate degenerative changes and uncovertebral spurring is present at C3-4. Mild foraminal narrowing is present on the left.  Atherosclerotic calcifications are present at the carotid bifurcations bilaterally.  The lung apices are clear.  IMPRESSION: 1. Mild generalized atrophy and white matter disease is slightly advanced for age. This is nonspecific and may be related to microvascular disease or alcohol use. 2. No acute intracranial abnormality. 3. Minimal spondylosis in the cervical spine.   Electronically Signed   By: Lawrence Santiago M.D.   On: 08/28/2013 15:50   Ct Cervical Spine Wo Contrast  08/28/2013   CLINICAL DATA:  Failure to thrive. Altered mental status. Decline in mental status and ability to take care of himself.  EXAM: CT HEAD WITHOUT CONTRAST  CT CERVICAL SPINE WITHOUT CONTRAST  TECHNIQUE: Multidetector CT imaging of the head and cervical spine was performed following the standard protocol without intravenous contrast. Multiplanar CT image reconstructions of the cervical spine were also generated.  COMPARISON:  CT head and cervical spine 04/10/2013  FINDINGS: CT HEAD FINDINGS  Mild generalized atrophy and white matter disease is present. No acute cortical infarct, hemorrhage, or mass lesion is present. The ventricles are proportionate to the degree of atrophy. No significant extra-axial fluid collection  is present. Study is mildly degraded by patient motion.  The paranasal sinuses and mastoid air cells are clear. The osseous skull is intact.  CT CERVICAL SPINE FINDINGS  The cervical spine is imaged and skull base through T1-2. Vertebral body heights alignment are maintained. Minimal endplate  degenerative changes and uncovertebral spurring is present at C3-4. Mild foraminal narrowing is present on the left.  Atherosclerotic calcifications are present at the carotid bifurcations bilaterally.  The lung apices are clear.  IMPRESSION: 1. Mild generalized atrophy and white matter disease is slightly advanced for age. This is nonspecific and may be related to microvascular disease or alcohol use. 2. No acute intracranial abnormality. 3. Minimal spondylosis in the cervical spine.   Electronically Signed   By: Lawrence Santiago M.D.   On: 08/28/2013 15:50   Dg Chest Portable 1 View  08/28/2013   CLINICAL DATA:  Altered mental status.  Chest pain.  EXAM: PORTABLE CHEST - 1 VIEW  COMPARISON:  CT SHOULDER*L* W/O CM dated 08/12/2013; DG CHEST 2 VIEW dated 04/10/2013  FINDINGS: Mediastinum and hilar structures normal. Lungs are clear. Heart size normal. No acute bony abnormality.  IMPRESSION: No acute abnormality.   Electronically Signed   By: Marcello Moores  Register   On: 08/28/2013 15:37     EKG Interpretation   Date/Time:  Wednesday August 28 2013 13:56:54 EDT Ventricular Rate:  59 PR Interval:  167 QRS Duration: 92 QT Interval:  456 QTC Calculation: 452 R Axis:   24 Text Interpretation:  Sinus rhythm Abnormal R-wave progression, early  transition No significant change since last tracing Confirmed by KNAPP   MD-I, IVA ([id number]) on 08/28/2013 3:45:33 PM      MDM   Final diagnoses:  None    Patient received in sign out from [place], [place].  Plan is to follow up on labs and admit for AMS.  On my exam, the patient is altered.  He is oriented to place, but not to time.  He does not recall the names of his kids.  He does not know why he is here.  He denies being in pain.  He has been admitted several times in the last 6 months, as recently as the last week in March for hyperammonemia.  Will give fluids, follow-up on labs and consult medicine.  Filed Vitals:   08/28/13 1607  BP: 140/63  Pulse: 68   Temp: 97.7 F (36.5 C)  Resp: 12   5:43 PM Discussed with Dr. Leonides Schanz.  Plan for admission for AMS and probably hepatic encephalopathy.  Patient discussed with [facility], who will admit the patient.  Montine Circle, PA-C 08/28/13 1744

## 2013-08-28 NOTE — ED Provider Notes (Addendum)
Patient brought to the emergency department by his wife he reports declining mental state. Patient is crying in the room. He states "I don't feel good". He denies having pain. He cannot tell me what not feeling good means.  Patient has dry lips and tongue. He is missing several teeth. He does not have peripheral edema, he does not appear to have a distended abdomen.  Medical screening examination/treatment/procedure(s) were conducted as a shared visit with non-physician practitioner(s) and myself.  I personally evaluated the patient during the encounter.   EKG Interpretation   Date/Time:  Wednesday August 28 2013 13:56:54 EDT Ventricular Rate:  59 PR Interval:  167 QRS Duration: 92 QT Interval:  456 QTC Calculation: 452 R Axis:   24 Text Interpretation:  Sinus rhythm Abnormal R-wave progression, early  transition No significant change since last tracing Confirmed by Carpenter   MD-I, Khaleem Burchill ([id number]) on 08/28/2013 3:45:33 PM       Rolland Porter, MD, Alanson Aly, MD 08/28/13 [place] Gerilyn Stargell, MD 08/28/13 1545

## 2013-08-28 NOTE — ED Notes (Signed)
Per EMS: pt from home, wife states he been having a steadly decline in mental status and taking care of self. Pt has no complaints.

## 2013-08-28 NOTE — ED Provider Notes (Signed)
See prior note   Janice Norrie, MD 08/28/13 1626

## 2013-08-28 NOTE — H&P (Signed)
[facility]          History and Physical    PCP:   Barbette Merino, MD   Chief Complaint:  Confusion, generalized weakness  HPI: Patient is a 58 year old white man with history of alcoholic cirrhosis and multiple admissions to the hospital for hepatic encephalopathy most recently on 08/09/2013. He is unable to give me much of  History, so I have contacted his girlfriend Serita Butcher via telephone. She states that for the past 3 days he was having copious amounts of diarrhea which forced her to discontinue the lactulose and give him several antidiarrheal tablets. Ever since then he has become more confused and weaker. She states that his gastroenterologist, Dr. Paulita Fujita told him that he did not need to take lactulose anymore, however I do not see any documentation of this in his records. Constance Holster believes that he would benefit from nursing home placement as she can no longer care for him given his repeated bouts of weakness and confusion. Hospitalist admission has been requested.  Allergies:  No Known Allergies    Past Medical History  Diagnosis Date  . Neuropathy   . Diabetes mellitus   . Bipolar affect, depressed   . Hypertension   . Arthritis   . Stroke     Mini stroke about 46yr ago  . Cirrhosis   . Alcohol abuse   . Chronic pain   . Cocaine abuse   . Muscle spasm     both legs  . Encephalopathy, hepatic   . Detached retina   . COPD (chronic obstructive pulmonary disease)     emphysema  . Bronchitis   . Barrett's esophagus   . GERD (gastroesophageal reflux disease)     has ulcer  . Anemia     Past Surgical History  Procedure Laterality Date  . Fracture surgery      Leg and arm 133yrago  . Esophagogastroduodenoscopy  04/04/2012    Procedure: ESOPHAGOGASTRODUODENOSCOPY (EGD);  Surgeon: JoIrene ShipperMD;  [facility];  Service: Endoscopy;  Laterality: N/A;  . Esophagogastroduodenoscopy Left 03/13/2013    Procedure:  ESOPHAGOGASTRODUODENOSCOPY (EGD);  Surgeon: WiArta SilenceMD;  [facility];  Service: Endoscopy;  Laterality: Left;  . Marland Kitchenye surgery  8 months ago both eyes    cataracts both eyes, detached eye, gas pocket  . Vasectomy    . Pars plana vitrectomy Left 07/08/2013    Procedure: PARS PLANA VITRECTOMY WITH 25 GAUGE;  Surgeon: GaHurman HornMD;  [facility];  Laterality: Left;  . Intraocular lens removal Left 07/08/2013    Procedure: REMOVAL OF INTRAOCULAR LENS;  Surgeon: GaHurman HornMD;  [facility];  Laterality: Left;  . Placement and suture of secondary intraocular lens Left 07/08/2013    Procedure: PLACEMENT AND SUTURE OF SECONDARY INTRAOCULAR LENS;  Surgeon: GaHurman HornMD;  [facility];  Laterality: Left;  Insertion of Anterior Capsule Intraocular Lens     Prior to Admission medications   Medication Sig Start Date End Date Taking? Authorizing Provider  ALPRAZolam (XDuanne Moron1 MG tablet Take 1 tablet (1 mg total) by mouth 2 (two) times daily as needed for anxiety. 08/12/13  Yes Nishant Dhungel, MD  diclofenac sodium (VOLTAREN) 1 % GEL Apply 1 application topically daily as needed (back and leg pain).   Yes Historical Provider, MD  folic acid (  FOLVITE) 1 MG tablet Take 1 tablet (1 mg total) by mouth daily. 04/12/13  Yes Janece Canterbury, MD  HYDROcodone-acetaminophen (NORCO/VICODIN) 5-325 MG per tablet Take 1 tablet by mouth every 6 (six) hours as needed for moderate pain.   Yes Historical Provider, MD  insulin aspart (NOVOLOG) 100 UNIT/ML FlexPen Inject 6 Units into the skin 3 (three) times daily as needed for high blood sugar.   Yes Historical Provider, MD  Insulin Glargine (LANTUS SOLOSTAR) 100 UNIT/ML SOPN Inject 45 Units into the skin at bedtime.    Yes Historical Provider, MD  lactulose (CHRONULAC) 10 GM/15ML solution Take 45 mLs (30 g total) by mouth 4 (four) times daily. Titrate to at least 3  BMs daily 08/12/13  Yes Nishant Dhungel, MD  lisinopril (PRINIVIL,ZESTRIL) 20 MG tablet Take 20 mg by mouth daily.   Yes Historical Provider, MD  pantoprazole (PROTONIX) 40 MG tablet Take 1 tablet (40 mg total) by mouth 2 (two) times daily. 03/14/13  Yes Bonnielee Haff, MD  propranolol (INDERAL) 20 MG tablet Take 1 tablet (20 mg total) by mouth 2 (two) times daily. 04/12/13  Yes Janece Canterbury, MD  rifaximin (XIFAXAN) 550 MG TABS tablet Take 550 mg by mouth 2 (two) times daily.   Yes Historical Provider, MD  spironolactone (ALDACTONE) 100 MG tablet Take 1 tablet (100 mg total) by mouth daily. 04/02/13  Yes Nishant Dhungel, MD    Social History:  reports that he has been smoking Cigarettes.  He has a 30 pack-year smoking history. He has never used smokeless tobacco. He reports that he drinks alcohol. He reports that he uses illicit drugs (Cocaine).  Family History  Problem Relation Age of Onset  . Hypotension Mother     Review of Systems:  Unable to obtain given his ongoing confusion.  Physical Exam: Blood pressure 133/89, pulse 64, temperature 97.5 F (36.4 C), temperature source Oral, resp. rate 16, height 5' 11"  (1.803 m), weight 93.9 kg (207 lb 0.2 oz), SpO2 99.00%. General: Drowsy, arouses to voice, oriented to person and place but not to time. HEENT: Normocephalic, atraumatic, pupils equal and reactive to light, very poor dentition and halitosis. Dry mucous membranes. Neck: Supple, no JVD, no lymphadenopathy, no bruits, no goiter. Cardiovascular: Regular rate and rhythm, no murmurs, rubs or gallops. Lungs: Clear to auscultation bilaterally. Abdomen: Soft, slightly distended, hypoactive bowel sounds, nontender to palpation. Extremities: No clubbing, cyanosis or edema, positive pulses. Neurologic: Moves all 4 spontaneously; unable to fully evaluate given current mental status.  Labs on Admission:  Results for orders placed during the hospital encounter of 08/28/13 (from the past 48  hour(s))  CBC WITH DIFFERENTIAL     Status: Abnormal   Collection Time    08/28/13  2:50 PM      Result Value Ref Range   WBC 2.8 (*) 4.0 - 10.5 K/uL   RBC 3.56 (*) 4.22 - 5.81 MIL/uL   Hemoglobin 11.3 (*) 13.0 - 17.0 g/dL   HCT 32.1 (*) 39.0 - 52.0 %   MCV 90.2  78.0 - 100.0 fL   MCH 31.7  26.0 - 34.0 pg   MCHC 35.2  30.0 - 36.0 g/dL   RDW 16.4 (*) 11.5 - 15.5 %   Platelets 52 (*) 150 - 400 K/uL   Comment: REPEATED TO VERIFY     SPECIMEN CHECKED FOR CLOTS     PLATELET COUNT CONFIRMED BY SMEAR   Neutrophils Relative % 48  43 - 77 %   Neutro Abs 1.3 (*)  1.7 - 7.7 K/uL   Lymphocytes Relative 31  12 - 46 %   Lymphs Abs 0.9  0.7 - 4.0 K/uL   Monocytes Relative 11  3 - 12 %   Monocytes Absolute 0.3  0.1 - 1.0 K/uL   Eosinophils Relative 10 (*) 0 - 5 %   Eosinophils Absolute 0.3  0.0 - 0.7 K/uL   Basophils Relative 1  0 - 1 %   Basophils Absolute 0.0  0.0 - 0.1 K/uL  COMPREHENSIVE METABOLIC PANEL     Status: Abnormal   Collection Time    08/28/13  2:50 PM      Result Value Ref Range   Sodium 140  137 - 147 mEq/L   Potassium 5.1  3.7 - 5.3 mEq/L   Chloride 107  96 - 112 mEq/L   CO2 20  19 - 32 mEq/L   Glucose, Bld 159 (*) 70 - 99 mg/dL   BUN 22  6 - 23 mg/dL   Creatinine, Ser 1.73 (*) 0.50 - 1.35 mg/dL   Calcium 9.1  8.4 - 10.5 mg/dL   Total Protein 6.7  6.0 - 8.3 g/dL   Albumin 3.1 (*) 3.5 - 5.2 g/dL   AST 43 (*) 0 - 37 U/L   ALT 23  0 - 53 U/L   Alkaline Phosphatase 87  39 - 117 U/L   Total Bilirubin 1.9 (*) 0.3 - 1.2 mg/dL   GFR calc non Af Amer 42 (*) >90 mL/min   GFR calc Af Amer 49 (*) >90 mL/min   Comment: (NOTE)     The eGFR has been calculated using the CKD EPI equation.     This calculation has not been validated in all clinical situations.     eGFR's persistently <90 mL/min signify possible Chronic Kidney     Disease.  ACETAMINOPHEN LEVEL     Status: None   Collection Time    08/28/13  2:50 PM      Result Value Ref Range   Acetaminophen (Tylenol), Serum  <15.0  10 - 30 ug/mL   Comment:            THERAPEUTIC CONCENTRATIONS VARY     SIGNIFICANTLY. A RANGE OF 10-30     ug/mL MAY BE AN EFFECTIVE     CONCENTRATION FOR MANY PATIENTS.     HOWEVER, SOME ARE BEST TREATED     AT CONCENTRATIONS OUTSIDE THIS     RANGE.     ACETAMINOPHEN CONCENTRATIONS     >150 ug/mL AT 4 HOURS AFTER     INGESTION AND >50 ug/mL AT 12     HOURS AFTER INGESTION ARE     OFTEN ASSOCIATED WITH TOXIC     REACTIONS.  SALICYLATE LEVEL     Status: Abnormal   Collection Time    08/28/13  2:50 PM      Result Value Ref Range   Salicylate Lvl <8.3 (*) 2.8 - 20.0 mg/dL  AMMONIA     Status: Abnormal   Collection Time    08/28/13  3:25 PM      Result Value Ref Range   Ammonia 125 (*) 11 - 60 umol/L  APTT     Status: Abnormal   Collection Time    08/28/13  3:40 PM      Result Value Ref Range   aPTT 43 (*) 24 - 37 seconds   Comment:            IF BASELINE aPTT IS ELEVATED,  SUGGEST PATIENT RISK ASSESSMENT     BE USED TO DETERMINE APPROPRIATE     ANTICOAGULANT THERAPY.  PROTIME-INR     Status: Abnormal   Collection Time    08/28/13  3:40 PM      Result Value Ref Range   Prothrombin Time 15.8 (*) 11.6 - 15.2 seconds   INR 1.29  0.00 - 1.49  ETHANOL     Status: None   Collection Time    08/28/13  3:40 PM      Result Value Ref Range   Alcohol, Ethyl (B) <11  0 - 11 mg/dL   Comment:            LOWEST DETECTABLE LIMIT FOR     SERUM ALCOHOL IS 11 mg/dL     FOR MEDICAL PURPOSES ONLY  URINE RAPID DRUG SCREEN (HOSP PERFORMED)     Status: Abnormal   Collection Time    08/28/13  4:39 PM      Result Value Ref Range   Opiates POSITIVE (*) NONE DETECTED   Cocaine NONE DETECTED  NONE DETECTED   Benzodiazepines POSITIVE (*) NONE DETECTED   Amphetamines NONE DETECTED  NONE DETECTED   Tetrahydrocannabinol NONE DETECTED  NONE DETECTED   Barbiturates POSITIVE (*) NONE DETECTED   Comment:            DRUG SCREEN FOR MEDICAL PURPOSES     ONLY.  IF CONFIRMATION IS NEEDED       FOR ANY PURPOSE, NOTIFY LAB     WITHIN 5 DAYS.                LOWEST DETECTABLE LIMITS     FOR URINE DRUG SCREEN     Drug Class       Cutoff (ng/mL)     Amphetamine      1000     Barbiturate      200     Benzodiazepine   096     Tricyclics       283     Opiates          300     Cocaine          300     THC              50  URINALYSIS, ROUTINE W REFLEX MICROSCOPIC     Status: Abnormal   Collection Time    08/28/13  4:39 PM      Result Value Ref Range   Color, Urine AMBER (*) YELLOW   Comment: BIOCHEMICALS MAY BE AFFECTED BY COLOR   APPearance CLEAR  CLEAR   Specific Gravity, Urine 1.016  1.005 - 1.030   pH 5.5  5.0 - 8.0   Glucose, UA NEGATIVE  NEGATIVE mg/dL   Hgb urine dipstick NEGATIVE  NEGATIVE   Bilirubin Urine NEGATIVE  NEGATIVE   Ketones, ur NEGATIVE  NEGATIVE mg/dL   Protein, ur NEGATIVE  NEGATIVE mg/dL   Urobilinogen, UA 1.0  0.0 - 1.0 mg/dL   Nitrite NEGATIVE  NEGATIVE   Leukocytes, UA NEGATIVE  NEGATIVE   Comment: MICROSCOPIC NOT DONE ON URINES WITH NEGATIVE PROTEIN, BLOOD, LEUKOCYTES, NITRITE, OR GLUCOSE <1000 mg/dL.    Radiological Exams on Admission: Ct Head Wo Contrast  08/28/2013   CLINICAL DATA:  Failure to thrive. Altered mental status. Decline in mental status and ability to take care of himself.  EXAM: CT HEAD WITHOUT CONTRAST  CT CERVICAL SPINE WITHOUT CONTRAST  TECHNIQUE: Multidetector CT imaging of  the head and cervical spine was performed following the standard protocol without intravenous contrast. Multiplanar CT image reconstructions of the cervical spine were also generated.  COMPARISON:  CT head and cervical spine 04/10/2013  FINDINGS: CT HEAD FINDINGS  Mild generalized atrophy and white matter disease is present. No acute cortical infarct, hemorrhage, or mass lesion is present. The ventricles are proportionate to the degree of atrophy. No significant extra-axial fluid collection is present. Study is mildly degraded by patient motion.  The paranasal  sinuses and mastoid air cells are clear. The osseous skull is intact.  CT CERVICAL SPINE FINDINGS  The cervical spine is imaged and skull base through T1-2. Vertebral body heights alignment are maintained. Minimal endplate degenerative changes and uncovertebral spurring is present at C3-4. Mild foraminal narrowing is present on the left.  Atherosclerotic calcifications are present at the carotid bifurcations bilaterally.  The lung apices are clear.  IMPRESSION: 1. Mild generalized atrophy and white matter disease is slightly advanced for age. This is nonspecific and may be related to microvascular disease or alcohol use. 2. No acute intracranial abnormality. 3. Minimal spondylosis in the cervical spine.   Electronically Signed   By: Lawrence Santiago M.D.   On: 08/28/2013 15:50   Ct Cervical Spine Wo Contrast  08/28/2013   CLINICAL DATA:  Failure to thrive. Altered mental status. Decline in mental status and ability to take care of himself.  EXAM: CT HEAD WITHOUT CONTRAST  CT CERVICAL SPINE WITHOUT CONTRAST  TECHNIQUE: Multidetector CT imaging of the head and cervical spine was performed following the standard protocol without intravenous contrast. Multiplanar CT image reconstructions of the cervical spine were also generated.  COMPARISON:  CT head and cervical spine 04/10/2013  FINDINGS: CT HEAD FINDINGS  Mild generalized atrophy and white matter disease is present. No acute cortical infarct, hemorrhage, or mass lesion is present. The ventricles are proportionate to the degree of atrophy. No significant extra-axial fluid collection is present. Study is mildly degraded by patient motion.  The paranasal sinuses and mastoid air cells are clear. The osseous skull is intact.  CT CERVICAL SPINE FINDINGS  The cervical spine is imaged and skull base through T1-2. Vertebral body heights alignment are maintained. Minimal endplate degenerative changes and uncovertebral spurring is present at C3-4. Mild foraminal narrowing is  present on the left.  Atherosclerotic calcifications are present at the carotid bifurcations bilaterally.  The lung apices are clear.  IMPRESSION: 1. Mild generalized atrophy and white matter disease is slightly advanced for age. This is nonspecific and may be related to microvascular disease or alcohol use. 2. No acute intracranial abnormality. 3. Minimal spondylosis in the cervical spine.   Electronically Signed   By: Lawrence Santiago M.D.   On: 08/28/2013 15:50   Dg Chest Portable 1 View  08/28/2013   CLINICAL DATA:  Altered mental status.  Chest pain.  EXAM: PORTABLE CHEST - 1 VIEW  COMPARISON:  CT SHOULDER*L* W/O CM dated 08/12/2013; DG CHEST 2 VIEW dated 04/10/2013  FINDINGS: Mediastinum and hilar structures normal. Lungs are clear. Heart size normal. No acute bony abnormality.  IMPRESSION: No acute abnormality.   Electronically Signed   By: Marcello Moores  Register   On: 08/28/2013 15:37    Assessment/Plan Principal Problem:   Acute encephalopathy Active Problems:   Encephalopathy, hepatic   Diabetes mellitus   Thrombocytopenia   Cirrhosis   Tobacco abuse   Hepatic encephalopathy   Pancytopenia    Acute metabolic encephalopathy -Secondary to elevated ammonia levels with hepatic  encephalopathy. -Please see below for details. -No focal deficits and not relieved TIA/CVAs in the differential.  Hepatic encephalopathy -This is a recurrent issue for him. -It appears that his significant other had stopped the lactulose about 4 days ago because of profuse diarrhea; this probably precipitated this event. -Ammonia level is 125 on admission. -Have related to girlfriend importance of compliance with lactulose. -Continue rifaximin. -Consider GI consultation if no improvement after approximately 48 hours with reinstatement of lactulose.  Pancytopenia -Related to cirrhosis.  Cirrhosis -Secondary to alcoholism.  Diabetes -Check hemoglobin A1c. -Continue home dose of Lantus. -Sliding scale insulin  while in the hospital.  DVT prophylaxis -SCDs given thrombocytopenia  CODE STATUS -Full code   Time Spent on Admission: 85 minutes  [facility] Pager: [phone] 08/28/2013, 7:17 PM

## 2013-08-28 NOTE — ED Notes (Signed)
Bed: GG26 Expected date:  Expected time:  Means of arrival:  Comments: EMS- Failure to Thrive

## 2013-08-28 NOTE — ED Provider Notes (Signed)
CSN: [id number]     Arrival date & time 08/28/13  1328 History   First MD Initiated Contact with Patient 08/28/13 1335     Chief Complaint  Patient presents with  . Failure To Thrive     (Consider location/radiation/quality/duration/timing/severity/associated sxs/prior Treatment) HPI Pt is a 58yo male brought to ED via EMS from home.  Hx limited due to pt being non-verbal beyond "yes" and "no"  Per triage note from EMS, wife states he has been having a stead decline in mental status and taking care of himself.  Pt has not other complaints. Family is no present in ED for further information.  Per medical records, pt has hx of DM, bipolar affect, HTN, stroke (~74yrs ago), cirrhosis, alcohol abuse, chronic pain, cocaine abuse, hepatic encephalopathy, COPD, anemia and Barrett's esophagus.    Past Medical History  Diagnosis Date  . Neuropathy   . Diabetes mellitus   . Bipolar affect, depressed   . Hypertension   . Arthritis   . Stroke     Mini stroke about 55yrs ago  . Cirrhosis   . Alcohol abuse   . Chronic pain   . Cocaine abuse   . Muscle spasm     both legs  . Encephalopathy, hepatic   . Detached retina   . COPD (chronic obstructive pulmonary disease)     emphysema  . Bronchitis   . Barrett's esophagus   . GERD (gastroesophageal reflux disease)     has ulcer  . Anemia    Past Surgical History  Procedure Laterality Date  . Fracture surgery      Leg and arm 10yrs ago  . Esophagogastroduodenoscopy  04/04/2012    Procedure: ESOPHAGOGASTRODUODENOSCOPY (EGD);  Surgeon: Irene Shipper, MD;  Location: [facility] ENDOSCOPY;  Service: Endoscopy;  Laterality: N/A;  . Esophagogastroduodenoscopy Left 03/13/2013    Procedure: ESOPHAGOGASTRODUODENOSCOPY (EGD);  Surgeon: Arta Silence, MD;  Location: [facility] ENDOSCOPY;  Service: Endoscopy;  Laterality: Left;  Marland Kitchen Eye surgery  8 months ago both eyes    cataracts both eyes, detached eye, gas pocket  . Vasectomy    . Pars plana vitrectomy Left 07/08/2013    Procedure: PARS PLANA VITRECTOMY WITH 25 GAUGE;  Surgeon: Hurman Horn, MD;  Location: [place];  Service: Ophthalmology;  Laterality: Left;  . Intraocular lens removal Left 07/08/2013    Procedure: REMOVAL OF INTRAOCULAR LENS;  Surgeon: Hurman Horn, MD;  Location: [place];  Service: Ophthalmology;  Laterality: Left;  . Placement and suture of secondary intraocular lens Left 07/08/2013    Procedure: PLACEMENT AND SUTURE OF SECONDARY INTRAOCULAR LENS;  Surgeon: Hurman Horn, MD;  Location: [place];  Service: Ophthalmology;  Laterality: Left;  Insertion of Anterior Capsule Intraocular Lens    Family History  Problem Relation Age of Onset  . Hypotension Mother    History  Substance Use Topics  . Smoking status: Current Every Day Smoker -- 1.00 packs/day for 30 years    Types: Cigarettes    Last Attempt to Quit: 04/06/2012  . Smokeless tobacco: Never Used     Comment: quit   . Alcohol Use: 0.0 oz/week     Comment: 12 pk beer daily  06/2013 - no alcohol since 11/2012    Review of Systems  Unable to perform ROS: Mental status change      Allergies  Review of patient's allergies indicates no known allergies.  Home Medications   Current Outpatient Rx  Name  Route  Sig  Dispense  Refill  . ALPRAZolam (XANAX) 1 MG tablet   Oral   Take 1 tablet (1 mg total) by mouth 2 (two) times daily as needed for anxiety.   20 tablet   0   . diclofenac sodium (VOLTAREN) 1 % GEL   Topical   Apply 1 application topically daily as needed (back and leg pain).         . folic acid (FOLVITE) 1 MG tablet   Oral   Take 1 tablet (1 mg total) by mouth daily.   30 tablet   0   . HYDROcodone-acetaminophen (NORCO/VICODIN) 5-325 MG per tablet   Oral   Take 1 tablet by mouth every 6 (six) hours as needed for moderate pain.         Marland Kitchen insulin aspart (NOVOLOG) 100 UNIT/ML FlexPen   Subcutaneous   Inject 6 Units into the skin 3 (three) times daily as needed for high blood sugar.         . Insulin  Glargine (LANTUS SOLOSTAR) 100 UNIT/ML SOPN   Subcutaneous   Inject 45 Units into the skin at bedtime.          Marland Kitchen lactulose (CHRONULAC) 10 GM/15ML solution   Oral   Take 45 mLs (30 g total) by mouth 4 (four) times daily. Titrate to at least 3 BMs daily   240 mL   2   . lisinopril (PRINIVIL,ZESTRIL) 20 MG tablet   Oral   Take 20 mg by mouth daily.         . pantoprazole (PROTONIX) 40 MG tablet   Oral   Take 1 tablet (40 mg total) by mouth 2 (two) times daily.   60 tablet   3   . propranolol (INDERAL) 20 MG tablet   Oral   Take 1 tablet (20 mg total) by mouth 2 (two) times daily.   60 tablet   0   . rifaximin (XIFAXAN) 550 MG TABS tablet   Oral   Take 550 mg by mouth 2 (two) times daily.         Marland Kitchen spironolactone (ALDACTONE) 100 MG tablet   Oral   Take 1 tablet (100 mg total) by mouth daily.   30 tablet   0    BP 140/63  Pulse 68  Temp(Src) 97.7 F (36.5 C) (Oral)  Resp 12  SpO2 99% Physical Exam  Nursing note and vitals reviewed. Constitutional: He appears well-developed.  HENT:  Head: Normocephalic and atraumatic.  Mouth/Throat: Mucous membranes are dry.  Dry mucous membranes, yellow and orange crusting around lips.  Eyes: Conjunctivae are normal. No scleral icterus.  Neck: Normal range of motion. Neck supple.  Cardiovascular: Normal rate, regular rhythm and normal heart sounds.   Pulmonary/Chest: Effort normal and breath sounds normal. No respiratory distress. He has no wheezes. He has no rales. He exhibits no tenderness.  No respiratory distress, able to speak in full sentences w/o difficulty. Lungs: CTAB  Abdominal: Soft. Bowel sounds are normal. He exhibits no distension and no mass. There is no tenderness. There is no rebound and no guarding.  Musculoskeletal: He exhibits tenderness. He exhibits no edema.  Limited ROM of extremities, decreased strength throughout all major muscle groups.  Neurological: He is alert.  Pt not coroperative during exam.   Acknowledges 'yes' when asked to sit up for heart and lung exam however does not sit up. Seems to fade back to sleep.  Not communicative beyond 'yes' or 'no'    Skin: Skin is warm  and dry.  Ecchymosis posterior right shoulder, 5-6cm oval in shape.  Skin in tact. No discharge, red streaking, or fluctuance.   Psychiatric: His speech is delayed and slurred. He is slowed. He is not actively hallucinating. Thought content is not delusional. He exhibits a depressed mood. He expresses no homicidal and no suicidal ideation. He expresses no suicidal plans and no homicidal plans.  tearful    ED Course  Procedures (including critical care time) Labs Review Labs Reviewed  CBC WITH DIFFERENTIAL - Abnormal; Notable for the following:    WBC 2.8 (*)    RBC 3.56 (*)    Hemoglobin 11.3 (*)    HCT 32.1 (*)    RDW 16.4 (*)    Platelets 52 (*)    Neutro Abs 1.3 (*)    Eosinophils Relative 10 (*)    All other components within normal limits  COMPREHENSIVE METABOLIC PANEL - Abnormal; Notable for the following:    Glucose, Bld 159 (*)    Creatinine, Ser 1.73 (*)    Albumin 3.1 (*)    AST 43 (*)    Total Bilirubin 1.9 (*)    GFR calc non Af Amer 42 (*)    GFR calc Af Amer 49 (*)    All other components within normal limits  SALICYLATE LEVEL - Abnormal; Notable for the following:    Salicylate Lvl <9.5 (*)    All other components within normal limits  ACETAMINOPHEN LEVEL  URINE RAPID DRUG SCREEN (HOSP PERFORMED)  AMMONIA  URINALYSIS, ROUTINE W REFLEX MICROSCOPIC  APTT  PROTIME-INR  ETHANOL   Imaging Review Ct Head Wo Contrast  08/28/2013   CLINICAL DATA:  Failure to thrive. Altered mental status. Decline in mental status and ability to take care of himself.  EXAM: CT HEAD WITHOUT CONTRAST  CT CERVICAL SPINE WITHOUT CONTRAST  TECHNIQUE: Multidetector CT imaging of the head and cervical spine was performed following the standard protocol without intravenous contrast. Multiplanar CT image  reconstructions of the cervical spine were also generated.  COMPARISON:  CT head and cervical spine 04/10/2013  FINDINGS: CT HEAD FINDINGS  Mild generalized atrophy and white matter disease is present. No acute cortical infarct, hemorrhage, or mass lesion is present. The ventricles are proportionate to the degree of atrophy. No significant extra-axial fluid collection is present. Study is mildly degraded by patient motion.  The paranasal sinuses and mastoid air cells are clear. The osseous skull is intact.  CT CERVICAL SPINE FINDINGS  The cervical spine is imaged and skull base through T1-2. Vertebral body heights alignment are maintained. Minimal endplate degenerative changes and uncovertebral spurring is present at C3-4. Mild foraminal narrowing is present on the left.  Atherosclerotic calcifications are present at the carotid bifurcations bilaterally.  The lung apices are clear.  IMPRESSION: 1. Mild generalized atrophy and white matter disease is slightly advanced for age. This is nonspecific and may be related to microvascular disease or alcohol use. 2. No acute intracranial abnormality. 3. Minimal spondylosis in the cervical spine.   Electronically Signed   By: Lawrence Santiago M.D.   On: 08/28/2013 15:50   Ct Cervical Spine Wo Contrast  08/28/2013   CLINICAL DATA:  Failure to thrive. Altered mental status. Decline in mental status and ability to take care of himself.  EXAM: CT HEAD WITHOUT CONTRAST  CT CERVICAL SPINE WITHOUT CONTRAST  TECHNIQUE: Multidetector CT imaging of the head and cervical spine was performed following the standard protocol without intravenous contrast. Multiplanar CT image reconstructions of  the cervical spine were also generated.  COMPARISON:  CT head and cervical spine 04/10/2013  FINDINGS: CT HEAD FINDINGS  Mild generalized atrophy and white matter disease is present. No acute cortical infarct, hemorrhage, or mass lesion is present. The ventricles are proportionate to the degree of  atrophy. No significant extra-axial fluid collection is present. Study is mildly degraded by patient motion.  The paranasal sinuses and mastoid air cells are clear. The osseous skull is intact.  CT CERVICAL SPINE FINDINGS  The cervical spine is imaged and skull base through T1-2. Vertebral body heights alignment are maintained. Minimal endplate degenerative changes and uncovertebral spurring is present at C3-4. Mild foraminal narrowing is present on the left.  Atherosclerotic calcifications are present at the carotid bifurcations bilaterally.  The lung apices are clear.  IMPRESSION: 1. Mild generalized atrophy and white matter disease is slightly advanced for age. This is nonspecific and may be related to microvascular disease or alcohol use. 2. No acute intracranial abnormality. 3. Minimal spondylosis in the cervical spine.   Electronically Signed   By: Lawrence Santiago M.D.   On: 08/28/2013 15:50   Dg Chest Portable 1 View  08/28/2013   CLINICAL DATA:  Altered mental status.  Chest pain.  EXAM: PORTABLE CHEST - 1 VIEW  COMPARISON:  CT SHOULDER*L* W/O CM dated 08/12/2013; DG CHEST 2 VIEW dated 04/10/2013  FINDINGS: Mediastinum and hilar structures normal. Lungs are clear. Heart size normal. No acute bony abnormality.  IMPRESSION: No acute abnormality.   Electronically Signed   By: Marcello Moores  Register   On: 08/28/2013 15:37     EKG Interpretation   Date/Time:  Wednesday August 28 2013 13:56:54 EDT Ventricular Rate:  59 PR Interval:  167 QRS Duration: 92 QT Interval:  456 QTC Calculation: 452 R Axis:   24 Text Interpretation:  Sinus rhythm Abnormal R-wave progression, early  transition No significant change since last tracing Confirmed by KNAPP   MD-I, IVA ([id number]) on 08/28/2013 3:45:33 PM      MDM   Final diagnoses:  None    Pt is a 58yo male with hx of alcohol abuse, cocaine abuse, "mini stroke" and hepatic encephalopathy. Pt gives limited hx.  Tearful during exam when asked why he is in the ED and  if he is in pain, which pt replies "no."  Pt denies wanting to hurt himself.  Pt does have ecchymosis on right posterior shoulder. Replies "yes" to fall but unable to explain when or how.    Due to limited hx will order multiple labs: CBC, CMP, Urine drug screen, acetaminophen level, salicylate level, ammonia level, APTT, Protime-INR, UA, Etoh.  As well as a head CT and cervical spine CT.    CT head and cervical spine: unremarkable  4:09 PM Pt signed out to [employer] PA-C at shift change. Plan is to review labs, pt will need to be admitted for workup of AMS.     Noland Fordyce, PA-C 08/28/13 1610

## 2013-08-29 DIAGNOSIS — F1010 Alcohol abuse, uncomplicated: Secondary | ICD-10-CM

## 2013-08-29 DIAGNOSIS — R4182 Altered mental status, unspecified: Secondary | ICD-10-CM

## 2013-08-29 DIAGNOSIS — F1020 Alcohol dependence, uncomplicated: Secondary | ICD-10-CM

## 2013-08-29 LAB — COMPREHENSIVE METABOLIC PANEL
ALBUMIN: 2.8 g/dL — AB (ref 3.5–5.2)
ALK PHOS: 83 U/L (ref 39–117)
ALT: 21 U/L (ref 0–53)
AST: 39 U/L — AB (ref 0–37)
BILIRUBIN TOTAL: 1.7 mg/dL — AB (ref 0.3–1.2)
BUN: 22 mg/dL (ref 6–23)
CO2: 19 mEq/L (ref 19–32)
Calcium: 8.7 mg/dL (ref 8.4–10.5)
Chloride: 110 mEq/L (ref 96–112)
Creatinine, Ser: 1.41 mg/dL — ABNORMAL HIGH (ref 0.50–1.35)
GFR calc Af Amer: 62 mL/min — ABNORMAL LOW (ref >90)
GFR calc non Af Amer: 54 mL/min — ABNORMAL LOW (ref >90)
Glucose, Bld: 145 mg/dL — ABNORMAL HIGH (ref 70–99)
POTASSIUM: 3.9 meq/L (ref 3.7–5.3)
SODIUM: 143 meq/L (ref 137–147)
TOTAL PROTEIN: 6 g/dL (ref 6.0–8.3)

## 2013-08-29 LAB — GLUCOSE, CAPILLARY
Glucose-Capillary: 96 mg/dL (ref 70–99)
Glucose-Capillary: 145 mg/dL — ABNORMAL HIGH (ref 70–99)
Glucose-Capillary: 173 mg/dL — ABNORMAL HIGH (ref 70–99)
Glucose-Capillary: 100 mg/dL — ABNORMAL HIGH (ref 70–99)

## 2013-08-29 LAB — HEMOGLOBIN A1C
Hgb A1c MFr Bld: 8.3 % — ABNORMAL HIGH (ref <5.7)
Mean Plasma Glucose: 192 mg/dL — ABNORMAL HIGH (ref <117)

## 2013-08-29 LAB — CBC
HEMATOCRIT: 30 % — AB (ref 39.0–52.0)
Hemoglobin: 10.3 g/dL — ABNORMAL LOW (ref 13.0–17.0)
MCH: 31.3 pg (ref 26.0–34.0)
MCHC: 34.3 g/dL (ref 30.0–36.0)
MCV: 91.2 fL (ref 78.0–100.0)
PLATELETS: 53 10*3/uL — AB (ref 150–400)
RBC: 3.29 MIL/uL — ABNORMAL LOW (ref 4.22–5.81)
RDW: 16.6 % — AB (ref 11.5–15.5)
WBC: 3.8 10*3/uL — ABNORMAL LOW (ref 4.0–10.5)

## 2013-08-29 NOTE — Progress Notes (Addendum)
[facility] PROGRESS NOTE  [name] MEQ:[id number] DOB: 11/13/1955 DOA: 08/28/2013 PCP: Barbette Merino, MD  Assessment/Plan:  Hepatic encephalopathy  -This is a recurrent problem, stopped the lactulose about 5 days ago because of profuse diarrhea; likely precipitated this event.  - importance of compliance with lactulose re-emphasized  -Continue rifaximin -improving, ammonia level in am -cut down IVF  Pancytopenia  -Related to cirrhosis.   Cirrhosis  -Secondary to alcoholism, reportedly quit drinking few months ago need to confirm this with girlfriend  Diabetes  -hemoglobin A1c 8.3 -Continue home dose of Lantus.  -Sliding scale insulin   AKI  -due to diarrhea improving on IVF  DVT prophylaxis  -SCDs given thrombocytopenia  Code Status: [place] Family Communication:none at bedside Disposition Plan: inpatient   HPI/Subjective: Feels better, no complaints  Objective: Filed Vitals:   08/29/13 0447  BP: 124/56  Pulse: 63  Temp: 98.2 F (36.8 C)  Resp: 18    Intake/Output Summary (Last 24 hours) at 08/29/13 1316 Last data filed at 08/29/13 0700  Gross per 24 hour  Intake 1026.25 ml  Output    425 ml  Net 601.25 ml   Filed Weights   08/28/13 1854  Weight: 93.9 kg (207 lb 0.2 oz)    Exam:   General:  AAOx to self, partly to place, intermittently confused,  Cardiovascular: S1S2/RRR  Respiratory: CTAB  Abdomen: soft, mildly distended  Musculoskeletal: trace edema,   Neuro: no asterixes   Data Reviewed: Basic Metabolic Panel:  Recent Labs Lab 08/28/13 1450 08/29/13 0350  NA 140 143  K 5.1 3.9  CL 107 110  CO2 20 19  GLUCOSE 159* 145*  BUN 22 22  CREATININE 1.73* 1.41*  CALCIUM 9.1 8.7   Liver Function Tests:  Recent Labs Lab 08/28/13 1450 08/29/13 0350  AST 43* 39*  ALT 23 21  ALKPHOS 87 83  BILITOT 1.9* 1.7*  PROT 6.7 6.0  ALBUMIN 3.1* 2.8*   No results found for this basename: LIPASE, AMYLASE,  in the last 168  hours  Recent Labs Lab 08/28/13 1525  AMMONIA 125*   CBC:  Recent Labs Lab 08/28/13 1450 08/29/13 0350  WBC 2.8* 3.8*  NEUTROABS 1.3*  --   HGB 11.3* 10.3*  HCT 32.1* 30.0*  MCV 90.2 91.2  PLT 52* 53*   Cardiac Enzymes: No results found for this basename: CKTOTAL, CKMB, CKMBINDEX, TROPONINI,  in the last 168 hours BNP (last 3 results) No results found for this basename: PROBNP,  in the last 8760 hours CBG:  Recent Labs Lab 08/28/13 2114 08/29/13 0748 08/29/13 1152  GLUCAP 119* 96 145*    No results found for this or any previous visit (from the past 240 hour(s)).   Studies: Ct Head Wo Contrast  08/28/2013   CLINICAL DATA:  Failure to thrive. Altered mental status. Decline in mental status and ability to take care of himself.  EXAM: CT HEAD WITHOUT CONTRAST  CT CERVICAL SPINE WITHOUT CONTRAST  TECHNIQUE: Multidetector CT imaging of the head and cervical spine was performed following the standard protocol without intravenous contrast. Multiplanar CT image reconstructions of the cervical spine were also generated.  COMPARISON:  CT head and cervical spine 04/10/2013  FINDINGS: CT HEAD FINDINGS  Mild generalized atrophy and white matter disease is present. No acute cortical infarct, hemorrhage, or mass lesion is present. The ventricles are proportionate to the degree of atrophy. No significant extra-axial fluid collection is present. Study is mildly degraded by patient motion.  The paranasal  sinuses and mastoid air cells are clear. The osseous skull is intact.  CT CERVICAL SPINE FINDINGS  The cervical spine is imaged and skull base through T1-2. Vertebral body heights alignment are maintained. Minimal endplate degenerative changes and uncovertebral spurring is present at C3-4. Mild foraminal narrowing is present on the left.  Atherosclerotic calcifications are present at the carotid bifurcations bilaterally.  The lung apices are clear.  IMPRESSION: 1. Mild generalized atrophy and  white matter disease is slightly advanced for age. This is nonspecific and may be related to microvascular disease or alcohol use. 2. No acute intracranial abnormality. 3. Minimal spondylosis in the cervical spine.   Electronically Signed   By: Lawrence Santiago M.D.   On: 08/28/2013 15:50   Ct Cervical Spine Wo Contrast  08/28/2013   CLINICAL DATA:  Failure to thrive. Altered mental status. Decline in mental status and ability to take care of himself.  EXAM: CT HEAD WITHOUT CONTRAST  CT CERVICAL SPINE WITHOUT CONTRAST  TECHNIQUE: Multidetector CT imaging of the head and cervical spine was performed following the standard protocol without intravenous contrast. Multiplanar CT image reconstructions of the cervical spine were also generated.  COMPARISON:  CT head and cervical spine 04/10/2013  FINDINGS: CT HEAD FINDINGS  Mild generalized atrophy and white matter disease is present. No acute cortical infarct, hemorrhage, or mass lesion is present. The ventricles are proportionate to the degree of atrophy. No significant extra-axial fluid collection is present. Study is mildly degraded by patient motion.  The paranasal sinuses and mastoid air cells are clear. The osseous skull is intact.  CT CERVICAL SPINE FINDINGS  The cervical spine is imaged and skull base through T1-2. Vertebral body heights alignment are maintained. Minimal endplate degenerative changes and uncovertebral spurring is present at C3-4. Mild foraminal narrowing is present on the left.  Atherosclerotic calcifications are present at the carotid bifurcations bilaterally.  The lung apices are clear.  IMPRESSION: 1. Mild generalized atrophy and white matter disease is slightly advanced for age. This is nonspecific and may be related to microvascular disease or alcohol use. 2. No acute intracranial abnormality. 3. Minimal spondylosis in the cervical spine.   Electronically Signed   By: Lawrence Santiago M.D.   On: 08/28/2013 15:50   Dg Chest Portable 1  View  08/28/2013   CLINICAL DATA:  Altered mental status.  Chest pain.  EXAM: PORTABLE CHEST - 1 VIEW  COMPARISON:  CT SHOULDER*L* W/O CM dated 08/12/2013; DG CHEST 2 VIEW dated 04/10/2013  FINDINGS: Mediastinum and hilar structures normal. Lungs are clear. Heart size normal. No acute bony abnormality.  IMPRESSION: No acute abnormality.   Electronically Signed   By: Marcello Moores  Register   On: 08/28/2013 15:37    Scheduled Meds: . folic acid  1 mg Oral Daily  . insulin aspart  0-15 Units Subcutaneous TID WC  . insulin aspart  4 Units Subcutaneous TID WC  . insulin glargine  45 Units Subcutaneous QHS  . lisinopril  20 mg Oral Daily  . pantoprazole  40 mg Oral BID  . propranolol  20 mg Oral BID  . rifaximin  550 mg Oral BID  . spironolactone  100 mg Oral Daily   Continuous Infusions:  Antibiotics Given (last 72 hours)   Date/Time Action Medication Dose   08/28/13 2257 Given   rifaximin (XIFAXAN) tablet 550 mg 550 mg   08/29/13 1035 Given   rifaximin (XIFAXAN) tablet 550 mg 550 mg      Principal Problem:  Acute encephalopathy Active Problems:   Diabetes mellitus   Thrombocytopenia   Cirrhosis   Tobacco abuse   Hepatic encephalopathy   Pancytopenia   Encephalopathy, hepatic    Time spent: 29min    Domenic Polite  [facility] Pager [phone]. If 7PM-7AM, please contact night-coverage at [email or web address], password [facility] 08/29/2013, 1:16 PM  LOS: 1 day

## 2013-08-29 NOTE — Clinical Documentation Improvement (Signed)
Possible Clinical Conditions?  Acute Renal Failure/Acute Kidney Injury Acute Tubular Necrosis Other Condition  Supporting Information: Diagnostics: Creat: (3/22) 1.22; (4/8) 1.73; (4/9) 1.41 Treatments: Monitor, evaluate, treat Thank You, Joya Salm ,RN Clinical Documentation Specialist:  [place] Information Management

## 2013-08-29 NOTE — Evaluation (Signed)
Physical Therapy Evaluation Patient Details Name: [name] MRN: [id number] DOB: 29-Apr-1956 Today's Date: 08/29/2013   History of Present Illness  58 yo male was admitted with weakness and confusion.  He had a lot of diarrhea prior to admission and has a h/o ETOH cirrhosis and hepatic encephalopathy  Clinical Impression  On eval, pt required Min assist for mobility-able to ambulate ~225 feet with walker. Remains confused and demonstrates decreased safety awareness. No family present during session. At this time, recommend ST rehab at SNF    Follow Up Recommendations SNF (unless mobility and cognition improve significantly)    Equipment Recommendations  Rolling walker with 5" wheels    Recommendations for Other Services OT consult     Precautions / Restrictions Precautions Precautions: Fall Restrictions Weight Bearing Restrictions: No      Mobility  Bed Mobility Overal bed mobility: Needs Assistance Bed Mobility: Supine to Sit;Sit to Supine     Supine to sit: HOB elevated;Min guard Sit to supine: HOB elevated;Min guard   General bed mobility comments: close guard for safety. Increased time. VCs safety, technique, hand placement  Transfers Overall transfer level: Needs assistance Equipment used: Rolling walker (2 wheeled) Transfers: Sit to/from Stand Sit to Stand: Min assist         General transfer comment: assist to rise, steady, control descent. Multimodal cues for safety, hand placement  Ambulation/Gait Ambulation/Gait assistance: Min assist Ambulation Distance (Feet): 225 Feet Assistive device: Rolling walker (2 wheeled) Gait Pattern/deviations: Decreased stride length;Step-through pattern     General Gait Details: slow gait speed. unsteady at times.   Stairs            Wheelchair Mobility    Modified Rankin (Stroke Patients Only)       Balance                                             Pertinent Vitals/Pain  Shoulders -unrated    Home Living Family/patient expects to be discharged to:: Private residence               Home Equipment: Environmental consultant - 2 wheels Additional Comments: states he lives with his wife    Prior Function Level of Independence: Independent with assistive device(s)               Hand Dominance   Dominant Hand: Right    Extremity/Trunk Assessment   Upper Extremity Assessment: Defer to OT evaluation RUE Deficits / Details: can lift to 90 degrees; both shoulders painful     LUE Deficits / Details: only able to lift L shoulder about 20 degree today.  Pt states he gets shots in both shoulders   Lower Extremity Assessment: Generalized weakness      Cervical / Trunk Assessment: Normal  Communication   Communication: No difficulties  Cognition Arousal/Alertness: Awake/alert Behavior During Therapy: Restless Overall Cognitive Status: Impaired/Different from baseline Area of Impairment: Memory;Attention;Safety/judgement;Awareness;Problem solving Orientation Level: Disoriented to;Time;Situation     Following Commands: Follows one step commands consistently Safety/Judgement: Decreased awareness of safety   Problem Solving: Requires tactile cues;Requires verbal cues;Slow processing General Comments: pt with decreased attention and safety:  reoriented to call bell    General Comments      Exercises        Assessment/Plan    PT Assessment Patient needs continued PT services  PT Diagnosis Difficulty walking;Generalized weakness  PT Problem List Decreased strength;Decreased activity tolerance;Decreased balance;Decreased mobility;Decreased cognition;Decreased safety awareness;Decreased knowledge of use of DME  PT Treatment Interventions DME instruction;Gait training;Functional mobility training;Therapeutic activities;Therapeutic exercise;Patient/family education;Balance training   PT Goals (Current goals can be found in the Care Plan section) Acute Rehab  PT Goals Patient Stated Goal: to get stronger PT Goal Formulation: With patient Time For Goal Achievement: 09/12/13 Potential to Achieve Goals: Good    Frequency Min 3X/week   Barriers to discharge        Co-evaluation               End of Session Equipment Utilized During Treatment: Gait belt Activity Tolerance: Patient tolerated treatment well Patient left: in bed;with call bell/phone within reach;with bed alarm set Nurse Communication: Mobility status         Time: 7414-2395 PT Time Calculation (min): 17 min   Charges:   PT Evaluation $Initial PT Evaluation Tier I: 1 Procedure PT Treatments $Gait Training: 8-22 mins   PT G Codes:          Weston Anna, MPT Pager: [phone]

## 2013-08-29 NOTE — Evaluation (Signed)
Occupational Therapy Evaluation Patient Details Name: [name] MRN: [id number] DOB: 1956-02-29 Today's Date: 08/29/2013    History of Present Illness Pt was admitted with weakness and confusion.  He had a lot of diarrhea prior to admission and has a h/o ETOH cirrhosis and hepatic encephalopathy   Clinical Impression   Pt was admitted for the above.  He will benefit from skilled OT to increase safety and independence with adls.  Pt had difficulty sustaining attention during this evaluation and required up to total A of 1 for adls.  Uncertain of PLOF.  Pt does have bil shoulder pain which also impairs his adls.  Will follow in acute with overall supervision level goals    Follow Up Recommendations  SNF;Supervision/Assistance - 24 hour (or )    Equipment Recommendations   (to be further assessed for 3:1)    Recommendations for Other Services       Precautions / Restrictions Precautions Precautions: Fall Restrictions Weight Bearing Restrictions: No      Mobility Bed Mobility Overal bed mobility: Needs Assistance Bed Mobility: Sit to Supine       Sit to supine: Supervision   General bed mobility comments: pt was up in the chair with NT when I entered rooms.  cues for technique  Transfers Overall transfer level: Needs assistance Equipment used: None Transfers: Sit to/from Stand Sit to Stand: Min assist         General transfer comment: assist to rise and steady    Balance                                            ADL Overall ADL's : Needs assistance/impaired Eating/Feeding: Set up;Bed level   Grooming: Bed level;Wash/dry hands;Wash/dry face;Minimal assistance   Upper Body Bathing: Maximal assistance;Sitting   Lower Body Bathing: Moderate assistance;Sit to/from stand   Upper Body Dressing : Maximal assistance;Sitting   Lower Body Dressing: Total assistance;Sit to/from stand   Toilet Transfer: Minimal assistance;Stand-pivot              General ADL Comments: Pt with confusion and decreased attention to task.  Pt followed some one step commands such as taking arms out of gown but did not carry through with others such as pulling sleeves up arms.  He did initiate placing both arms into sleeves.  Decreased sustained attention to task.  Pt can cross legs and reach to feet but did not carry through with pulling sock up.       Vision                     Perception     Praxis      Pertinent Vitals/Pain *pain in bil shoulders, did not rate.  Repositioned and modified activity     Hand Dominance Right   Extremity/Trunk Assessment Upper Extremity Assessment Upper Extremity Assessment: RUE deficits/detail;LUE deficits/detail RUE Deficits / Details: can lift to 90 degrees; both shoulders painful LUE Deficits / Details: only able to lift L shoulder about 20 degree today.  Pt states he gets shots in both shoulders           Communication Communication Communication: No difficulties   Cognition Arousal/Alertness: Awake/alert Behavior During Therapy: WFL for tasks assessed/performed Overall Cognitive Status: Impaired/Different from baseline Area of Impairment: Memory;Following commands;Safety/judgement;Orientation Orientation Level: Disoriented to;Time     Following Commands: Follows one  step commands inconsistently Safety/Judgement: Decreased awareness of safety     General Comments: pt with decreased attention and safety:  reoriented to call bell   General Comments       Exercises       Shoulder Instructions      Home Living Family/patient expects to be discharged to:: Private residence                                 Additional Comments: states he lives with his wife      Prior Functioning/Environment Level of Independence: Independent with assistive device(s)             OT Diagnosis: Generalized weakness;Cognitive deficits   OT Problem List: Decreased  strength;Decreased range of motion;Decreased activity tolerance;Decreased cognition;Decreased safety awareness;Pain   OT Treatment/Interventions: Self-care/ADL training;DME and/or AE instruction;Patient/family education;Cognitive remediation/compensation    OT Goals(Current goals can be found in the care plan section) Acute Rehab OT Goals Patient Stated Goal: none stated OT Goal Formulation: With patient Time For Goal Achievement: 09/12/13 Potential to Achieve Goals: Good ADL Goals Pt Will Perform Eating: Independently;sitting Pt Will Perform Grooming: with supervision;standing Pt Will Transfer to Toilet: with supervision;ambulating;bedside commode;regular height toilet Pt Will Perform Toileting - Clothing Manipulation and hygiene: with supervision;sit to/from stand Additional ADL Goal #1: pt will perform UB adls with set up without cues Additional ADL Goal #2: pt will perform LB adls with supervision, sit to stand without cues  OT Frequency: Min 2X/week   Barriers to D/C:            Co-evaluation              End of Session    Activity Tolerance: Patient limited by lethargy (groggy; dozing) Patient left: in bed;with call bell/phone within reach   Time: 1154-1210 OT Time Calculation (min): 16 min Charges:  OT General Charges $OT Visit: 1 Procedure OT Evaluation $Initial OT Evaluation Tier I: 1 Procedure OT Treatments $Self Care/Home Management : 8-22 mins G-Codes:    Lesle Chris 2013-09-02, 12:46 PM Lesle Chris, OTR/L [phone] 09/02/13  Lesle Chris, OTR/L [phone] Sep 02, 2013

## 2013-08-29 NOTE — Progress Notes (Signed)
Nutrition Brief Note  Patient identified on the Malnutrition Screening Tool (MST) Report  Wt Readings from Last 15 Encounters:  08/28/13 207 lb 0.2 oz (93.9 kg)  08/19/13 215 lb (97.523 kg)  08/09/13 205 lb 7.5 oz (93.2 kg)  08/02/13 216 lb (97.977 kg)  07/08/13 232 lb (105.235 kg)  07/08/13 232 lb (105.235 kg)  04/01/13 254 lb 8 oz (115.44 kg)  03/14/13 239 lb 3.2 oz (108.5 kg)  03/14/13 239 lb 3.2 oz (108.5 kg)  03/09/13 232 lb (105.235 kg)  02/05/13 233 lb (105.688 kg)  12/13/12 250 lb (113.4 kg)  11/05/12 215 lb (97.523 kg)  09/15/12 180 lb (81.647 kg)  07/19/12 221 lb 1.9 oz (100.3 kg)    Body mass index is 28.89 kg/(m^2). Patient meets criteria for Overweight based on current BMI.   Current diet order is Carb Modified. Pt continues to be confused during RD visit, reported to have had slept through breakfast. Assisted Nurse Tech in lunch tray set up, pt eager for meal. Pt denied any changes in weight or appetite. Reports girlfriend cooks several meals a day, and noted to consume "junk food" for snacks. Labs and medications reviewed.   Pt and family may benefit from DM diet education when less confused, as pt is not familiar with DM diet recommendations and reported to have chronically elevated CBG's at home.  No nutrition interventions warranted at this time. If nutrition issues arise, please consult RD.   Atlee Abide MS RD LDN Clinical Dietitian [phone]

## 2013-08-29 NOTE — Care Management Note (Addendum)
    Page 1 of 1   09/05/2013     12:55:59 PM   CARE MANAGEMENT NOTE 09/05/2013  Patient:  [name], [name]   Account Number:  [id number]  Date Initiated:  08/29/2013  Documentation initiated by:  Dessa Phi  Subjective/Objective Assessment:   58 Y/O M ADMITTED W/AMS.ACUTE ENCEPHALOPATHY.AV:WPVX CIRRHOSIS.     Action/Plan:   FROM HOME.HAS PCP,PHARMACY.   Anticipated DC Date:  09/05/2013   Anticipated DC Plan:  SKILLED NURSING FACILITY  In-house referral  Clinical Social Worker      DC Planning Services  CM consult      Choice offered to / List presented to:             Status of service:  Completed, signed off Medicare Important Message given?  NA - LOS <3 / Initial given by admissions (If response is "NO", the following Medicare IM given date fields will be blank) Date Medicare IM given:   Date Additional Medicare IM given:    Discharge Disposition:  [place]  Per UR Regulation:  Reviewed for med. necessity/level of care/duration of stay  If discussed at [place] of Stay Meetings, dates discussed:   09/05/2013    Comments:  09/05/13 [facility] RN,BSN NCM [phone] D/C SNF.  4/13 Sunday Spillers RN CM 1100 Remains confused, requiring sitter, hope to d/c sitter today.  08/29/13 Noe Goyer RN,BSN NCM [phone]

## 2013-08-30 DIAGNOSIS — K7682 Hepatic encephalopathy: Principal | ICD-10-CM

## 2013-08-30 DIAGNOSIS — K7290 Hepatic failure, unspecified without coma: Principal | ICD-10-CM

## 2013-08-30 LAB — CBC
HEMATOCRIT: 31.9 % — AB (ref 39.0–52.0)
Hemoglobin: 11.2 g/dL — ABNORMAL LOW (ref 13.0–17.0)
MCH: 31.7 pg (ref 26.0–34.0)
MCHC: 35.1 g/dL (ref 30.0–36.0)
MCV: 90.4 fL (ref 78.0–100.0)
PLATELETS: 65 10*3/uL — AB (ref 150–400)
RBC: 3.53 MIL/uL — ABNORMAL LOW (ref 4.22–5.81)
RDW: 16.4 % — AB (ref 11.5–15.5)
WBC: 4.8 10*3/uL (ref 4.0–10.5)

## 2013-08-30 LAB — COMPREHENSIVE METABOLIC PANEL
ALT: 20 U/L (ref 0–53)
AST: 42 U/L — ABNORMAL HIGH (ref 0–37)
Albumin: 3 g/dL — ABNORMAL LOW (ref 3.5–5.2)
Alkaline Phosphatase: 84 U/L (ref 39–117)
BILIRUBIN TOTAL: 1.9 mg/dL — AB (ref 0.3–1.2)
BUN: 19 mg/dL (ref 6–23)
CHLORIDE: 107 meq/L (ref 96–112)
CO2: 19 mEq/L (ref 19–32)
Calcium: 8.8 mg/dL (ref 8.4–10.5)
Creatinine, Ser: 1.2 mg/dL (ref 0.50–1.35)
GFR calc Af Amer: 76 mL/min — ABNORMAL LOW (ref >90)
GFR calc non Af Amer: 65 mL/min — ABNORMAL LOW (ref >90)
GLUCOSE: 117 mg/dL — AB (ref 70–99)
POTASSIUM: 4.3 meq/L (ref 3.7–5.3)
Sodium: 140 mEq/L (ref 137–147)
TOTAL PROTEIN: 6.4 g/dL (ref 6.0–8.3)

## 2013-08-30 LAB — GLUCOSE, CAPILLARY
Glucose-Capillary: 119 mg/dL — ABNORMAL HIGH (ref 70–99)
Glucose-Capillary: 164 mg/dL — ABNORMAL HIGH (ref 70–99)
Glucose-Capillary: 147 mg/dL — ABNORMAL HIGH (ref 70–99)
Glucose-Capillary: 110 mg/dL — ABNORMAL HIGH (ref 70–99)

## 2013-08-30 LAB — AMMONIA: Ammonia: 145 umol/L — ABNORMAL HIGH (ref 11–60)

## 2013-08-30 MED ORDER — LACTULOSE 10 GM/15ML PO SOLN
20.0000 g | Freq: Two times a day (BID) | ORAL | Status: DC
  Administered 2013-08-30 – 2013-09-02 (×8): 20 g via ORAL
  Filled 2013-08-30 (×12): qty 30

## 2013-08-30 NOTE — Progress Notes (Signed)
Physical Therapy Treatment Patient Details Name: [name] MRN: [id number] DOB: 12/09/1955 Today's Date: 08/30/2013    History of Present Illness 58 yo male was admitted with weakness and confusion.  He had a lot of diarrhea prior to admission and has a h/o ETOH cirrhosis and hepatic encephalopathy    PT Comments    Pt required increased time to arouse enough to participate.  Pt c/o pain "all over" amd swelling B hands.  Assisted from supine to sit required increased time and use of bed pad to scoot pt to EOB.  Pt sat several min EOB before amb in hallway.  Slow/sluggish gait required increased assist today vs yesterday and second assit to follow with recliner.  HIGH FALL RISK.  Follow Up Recommendations  SNF     Equipment Recommendations       Recommendations for Other Services       Precautions / Restrictions Precautions Precautions: Fall Restrictions Weight Bearing Restrictions: No    Mobility  Bed Mobility Overal bed mobility: Needs Assistance Bed Mobility: Supine to Sit     Supine to sit: HOB elevated;Mod assist;Min assist     General bed mobility comments: increased time and increased assist c/o B UE swelling and pain in both hands L>R. Used bed pad to assist with scooting.  Pt sat EOB for several min before he was able to self sit upright at Supervision level.  Pt c/o pain "all over".   Transfers Overall transfer level: Needs assistance Equipment used: Rolling walker (2 wheeled) Transfers: Sit to/from Stand Sit to Stand: +2 safety/equipment;Mod assist         General transfer comment: Pt required increased assist today vs yesterday due to increased c/o pain (general) and increased state of groggyness.  Ambulation/Gait Ambulation/Gait assistance: +2 safety/equipment;Mod assist Ambulation Distance (Feet): 85 Feet Assistive device: Rolling walker (2 wheeled) Gait Pattern/deviations: Step-to pattern;Step-through pattern;Decreased stride length;Trunk  flexed;Shuffle Gait velocity: decreased   General Gait Details: decreased amb distance and required increased physical assist this session.  Very groggy/sluggish gait with difficulty grasping RW with B hands L>R.  Recliner following behind for safety.    Stairs            Wheelchair Mobility    Modified Rankin (Stroke Patients Only)       Balance                                    Cognition                            Exercises      General Comments        Pertinent Vitals/Pain     Home Living                      Prior Function            PT Goals (current goals can now be found in the care plan section) Progress towards PT goals: Progressing toward goals    Frequency  Min 3X/week    PT Plan      Co-evaluation             End of Session Equipment Utilized During Treatment: Gait belt Activity Tolerance: Patient limited by lethargy Patient left: in chair;with call bell/phone within reach;with chair alarm set     Time: 1112-1140 PT Time  Calculation (min): 28 min  Charges:  $Gait Training: 8-22 mins $Therapeutic Activity: 8-22 mins                    G Codes:      Rica Koyanagi  PTA [facility]  Acute  Rehab Pager      [phone]

## 2013-08-30 NOTE — Progress Notes (Signed)
[facility] PROGRESS NOTE  [name] BJY:[id number] DOB: January 10, 1956 DOA: 08/28/2013 PCP: Barbette Merino, MD  Assessment/Plan:  Hepatic encephalopathy  -This is a recurrent problem, stopped the lactulose about 5 days ago because of profuse diarrhea; likely precipitated this event.  - importance of compliance with lactulose re-emphasized  -Continue rifaximin, add lactulose -improving, but ammonia level up, will repeat ammonia level in am -cut down IVF  Pancytopenia  -Related to cirrhosis.   Cirrhosis  -Secondary to alcoholism, reportedly quit drinking few months ago need to confirm this with girlfriend  Diabetes  -hemoglobin A1c 8.3 -Continue home dose of Lantus.  -Sliding scale insulin   DVT prophylaxis  -SCDs given thrombocytopenia  Pt/Ot/Ambulate  Code Status: [place] Family Communication:none at bedside Disposition Plan: inpatient   HPI/Subjective: Feels better, no complaints  Objective: Filed Vitals:   08/30/13 0443  BP: 131/63  Pulse: 66  Temp: 97.4 F (36.3 C)  Resp: 16    Intake/Output Summary (Last 24 hours) at 08/30/13 1258 Last data filed at 08/30/13 1244  Gross per 24 hour  Intake    480 ml  Output    425 ml  Net     55 ml   Filed Weights   08/28/13 1854  Weight: 93.9 kg (207 lb 0.2 oz)    Exam:   General:  AAOx to self, partly to place, intermittently confused,  Cardiovascular: S1S2/RRR  Respiratory: CTAB  Abdomen: soft, mildly distended  Musculoskeletal: trace edema,   Neuro: no asterixes   Data Reviewed: Basic Metabolic Panel:  Recent Labs Lab 08/28/13 1450 08/29/13 0350 08/30/13 0443  NA 140 143 140  K 5.1 3.9 4.3  CL 107 110 107  CO2 20 19 19   GLUCOSE 159* 145* 117*  BUN 22 22 19   CREATININE 1.73* 1.41* 1.20  CALCIUM 9.1 8.7 8.8   Liver Function Tests:  Recent Labs Lab 08/28/13 1450 08/29/13 0350 08/30/13 0443  AST 43* 39* 42*  ALT 23 21 20   ALKPHOS 87 83 84  BILITOT 1.9* 1.7* 1.9*  PROT 6.7  6.0 6.4  ALBUMIN 3.1* 2.8* 3.0*   No results found for this basename: LIPASE, AMYLASE,  in the last 168 hours  Recent Labs Lab 08/28/13 1525 08/30/13 0443  AMMONIA 125* 145*   CBC:  Recent Labs Lab 08/28/13 1450 08/29/13 0350 08/30/13 0443  WBC 2.8* 3.8* 4.8  NEUTROABS 1.3*  --   --   HGB 11.3* 10.3* 11.2*  HCT 32.1* 30.0* 31.9*  MCV 90.2 91.2 90.4  PLT 52* 53* 65*   Cardiac Enzymes: No results found for this basename: CKTOTAL, CKMB, CKMBINDEX, TROPONINI,  in the last 168 hours BNP (last 3 results) No results found for this basename: PROBNP,  in the last 8760 hours CBG:  Recent Labs Lab 08/29/13 1152 08/29/13 1718 08/29/13 2154 08/30/13 0734 08/30/13 1154  GLUCAP 145* 173* 100* 119* 164*    No results found for this or any previous visit (from the past 240 hour(s)).   Studies: Ct Head Wo Contrast  08/28/2013   CLINICAL DATA:  Failure to thrive. Altered mental status. Decline in mental status and ability to take care of himself.  EXAM: CT HEAD WITHOUT CONTRAST  CT CERVICAL SPINE WITHOUT CONTRAST  TECHNIQUE: Multidetector CT imaging of the head and cervical spine was performed following the standard protocol without intravenous contrast. Multiplanar CT image reconstructions of the cervical spine were also generated.  COMPARISON:  CT head and cervical spine 04/10/2013  FINDINGS: CT HEAD FINDINGS  Mild generalized atrophy and white matter disease is present. No acute cortical infarct, hemorrhage, or mass lesion is present. The ventricles are proportionate to the degree of atrophy. No significant extra-axial fluid collection is present. Study is mildly degraded by patient motion.  The paranasal sinuses and mastoid air cells are clear. The osseous skull is intact.  CT CERVICAL SPINE FINDINGS  The cervical spine is imaged and skull base through T1-2. Vertebral body heights alignment are maintained. Minimal endplate degenerative changes and uncovertebral spurring is present at  C3-4. Mild foraminal narrowing is present on the left.  Atherosclerotic calcifications are present at the carotid bifurcations bilaterally.  The lung apices are clear.  IMPRESSION: 1. Mild generalized atrophy and white matter disease is slightly advanced for age. This is nonspecific and may be related to microvascular disease or alcohol use. 2. No acute intracranial abnormality. 3. Minimal spondylosis in the cervical spine.   Electronically Signed   By: Lawrence Santiago M.D.   On: 08/28/2013 15:50   Ct Cervical Spine Wo Contrast  08/28/2013   CLINICAL DATA:  Failure to thrive. Altered mental status. Decline in mental status and ability to take care of himself.  EXAM: CT HEAD WITHOUT CONTRAST  CT CERVICAL SPINE WITHOUT CONTRAST  TECHNIQUE: Multidetector CT imaging of the head and cervical spine was performed following the standard protocol without intravenous contrast. Multiplanar CT image reconstructions of the cervical spine were also generated.  COMPARISON:  CT head and cervical spine 04/10/2013  FINDINGS: CT HEAD FINDINGS  Mild generalized atrophy and white matter disease is present. No acute cortical infarct, hemorrhage, or mass lesion is present. The ventricles are proportionate to the degree of atrophy. No significant extra-axial fluid collection is present. Study is mildly degraded by patient motion.  The paranasal sinuses and mastoid air cells are clear. The osseous skull is intact.  CT CERVICAL SPINE FINDINGS  The cervical spine is imaged and skull base through T1-2. Vertebral body heights alignment are maintained. Minimal endplate degenerative changes and uncovertebral spurring is present at C3-4. Mild foraminal narrowing is present on the left.  Atherosclerotic calcifications are present at the carotid bifurcations bilaterally.  The lung apices are clear.  IMPRESSION: 1. Mild generalized atrophy and white matter disease is slightly advanced for age. This is nonspecific and may be related to microvascular  disease or alcohol use. 2. No acute intracranial abnormality. 3. Minimal spondylosis in the cervical spine.   Electronically Signed   By: Lawrence Santiago M.D.   On: 08/28/2013 15:50   Dg Chest Portable 1 View  08/28/2013   CLINICAL DATA:  Altered mental status.  Chest pain.  EXAM: PORTABLE CHEST - 1 VIEW  COMPARISON:  CT SHOULDER*L* W/O CM dated 08/12/2013; DG CHEST 2 VIEW dated 04/10/2013  FINDINGS: Mediastinum and hilar structures normal. Lungs are clear. Heart size normal. No acute bony abnormality.  IMPRESSION: No acute abnormality.   Electronically Signed   By: Marcello Moores  Register   On: 08/28/2013 15:37    Scheduled Meds: . folic acid  1 mg Oral Daily  . insulin aspart  0-15 Units Subcutaneous TID WC  . insulin aspart  4 Units Subcutaneous TID WC  . insulin glargine  45 Units Subcutaneous QHS  . lactulose  20 g Oral BID  . lisinopril  20 mg Oral Daily  . pantoprazole  40 mg Oral BID  . propranolol  20 mg Oral BID  . rifaximin  550 mg Oral BID  . spironolactone  100 mg Oral Daily  Continuous Infusions:  Antibiotics Given (last 72 hours)   Date/Time Action Medication Dose   08/28/13 2257 Given   rifaximin (XIFAXAN) tablet 550 mg 550 mg   08/29/13 1035 Given   rifaximin (XIFAXAN) tablet 550 mg 550 mg   08/29/13 2155 Given   rifaximin (XIFAXAN) tablet 550 mg 550 mg   08/30/13 5462 Given   rifaximin (XIFAXAN) tablet 550 mg 550 mg      Principal Problem:   Acute encephalopathy Active Problems:   Diabetes mellitus   Thrombocytopenia   Cirrhosis   Tobacco abuse   Hepatic encephalopathy   Pancytopenia   Encephalopathy, hepatic    Time spent: 33min    Domenic Polite  [facility] Pager [phone]. If 7PM-7AM, please contact night-coverage at [email or web address], password [facility] 08/30/2013, 12:58 PM  LOS: 2 days

## 2013-08-30 NOTE — Progress Notes (Signed)
Clinical Social Work Department BRIEF PSYCHOSOCIAL ASSESSMENT 08/30/2013  Patient:  [name], [name]     Account Number:  [id number]     Admit date:  08/28/2013  Clinical Social Worker:  Venia Minks  Date/Time:  08/30/2013 12:00 M  Referred by:  Physician  Date Referred:  08/30/2013 Referred for  SNF Placement   Other Referral:   Interview type:  Other - See comment Other interview type:   girlfriend    PSYCHOSOCIAL DATA Living Status:  SIGNIFICANT OTHER Admitted from facility:   Level of care:   Primary support name:  [name] Primary support relationship to patient:  NONE Degree of support available:   poor    CURRENT CONCERNS Current Concerns  Post-Acute Placement   Other Concerns:    SOCIAL WORK ASSESSMENT / PLAN CSW met with patient. patient is confused and unable to participate in assessment. CSW called patients girlfriend, [name], discussed that patient has [employer] and that while physical therapy recommended snf, patient is min guard and walking 225 feet. CSW will attempt to find placement for patient and get auth, however, CSW warned girlfriend that [place] may deny auth and then patient would have to go home. [name] discussed applying for medicaid for patient. CSW encouraged this but states that it may not help this hospitalization. That she would have to take patient home as no facility will take without a payment source.   Assessment/plan status:   Other assessment/ plan:   Information/referral to community resources:    PATIENT'S/FAMILY'S RESPONSE TO PLAN OF CARE: girlfriend is understanding of insurance limitations but she asks CSW to still attempt snf placement.

## 2013-08-31 DIAGNOSIS — K922 Gastrointestinal hemorrhage, unspecified: Secondary | ICD-10-CM

## 2013-08-31 LAB — GLUCOSE, CAPILLARY
GLUCOSE-CAPILLARY: 113 mg/dL — AB (ref 70–99)
GLUCOSE-CAPILLARY: 94 mg/dL (ref 70–99)
GLUCOSE-CAPILLARY: 127 mg/dL — AB (ref 70–99)
Glucose-Capillary: 128 mg/dL — ABNORMAL HIGH (ref 70–99)

## 2013-08-31 LAB — AMMONIA: Ammonia: 63 umol/L — ABNORMAL HIGH (ref 11–60)

## 2013-08-31 MED ORDER — SODIUM CHLORIDE 0.9 % IV SOLN
INTRAVENOUS | Status: DC | PRN
  Administered 2013-08-31 – 2013-09-01 (×2): via INTRAVENOUS

## 2013-08-31 MED ORDER — LORAZEPAM 2 MG/ML IJ SOLN
0.5000 mg | Freq: Once | INTRAMUSCULAR | Status: AC
  Administered 2013-08-31: 0.5 mg via INTRAVENOUS
  Filled 2013-08-31: qty 1

## 2013-08-31 NOTE — Progress Notes (Signed)
The patient has tried repeatedly to get out of bed. Staff has constantly been in the room to stop the patient from getting out of bed. Patient has been reminded constantly that he is in the hospital as he thinks he is at home sometimes. PCP has been notified.

## 2013-08-31 NOTE — Progress Notes (Signed)
We have atempted everything tonight to keep the pt out of restraints--a 1:1 safety sitter until 11pm, Morphine.5mg , Xanax 1mg  po, then called to get Ativan IV--one time dose of that was fiven .5mg , & still pt is trying to come out of bed, yanked off condom cath, & pee'd all over floor & safety mat, & the sitters shoes got urine on them also. He is very verbally abusive & cursing staff. At 11pm we went down from 2 techs to 1 tech & no safety sitter,his sitter was pulled to sit with a more complicated case that requires 1:1 safety sitter at all times per policy, & no other staff available to sit with pt, but one tech was trying to sit with pt, but we cant care for other pt's so NP called again & pt placed in safety restraints as per orders. The pt AMS & high amonia level & dx of encephalopathy, renders the pt incoherently to understanding safety precautions & calling for assist, pt gait very unsteady & high fall risk. Keeping pt safe is top priority.

## 2013-08-31 NOTE — Progress Notes (Signed)
Spoke with [name] girlfriend.  [name] girlfriend has chosen [facility].  Weekday CSW to follow.  Bernita Raisin, [place] Work [phone]

## 2013-08-31 NOTE — Progress Notes (Signed)
3 SNF offers received.  LM for [name] girlfriend with the names of the SNFs that have offered Pt a bed.  CSW to leave a hard copy of SNF offers in [name] room.  Bernita Raisin, [place] Work [phone]

## 2013-08-31 NOTE — Progress Notes (Signed)
[facility] PROGRESS NOTE  FRASER BUSCHE HBZ:[id number] DOB: Jun 19, 1955 DOA: 08/28/2013 PCP: Barbette Merino, MD  Assessment/Plan:  Hepatic encephalopathy  -This is a recurrent problem, stopped the lactulose about 5 days ago because of profuse diarrhea; likely precipitated this event.  - importance of compliance with lactulose re-emphasized  -Continue rifaximin, added lactulose -ammonia level improving but more confused overnight -off IVF now -DC restraints as tolerated  Pancytopenia  -Related to cirrhosis.   Cirrhosis  -Secondary to alcoholism, reportedly quit drinking few months ago need to confirm this with girlfriend  Diabetes  -hemoglobin A1c 8.3 -Continue home dose of Lantus.  -Sliding scale insulin   Thrombocytopenia -chronic, stable  DVT prophylaxis  -SCDs given thrombocytopenia  Pt/Ot/Ambulate  Code Status: FUll COde Family Communication:none at bedside Disposition Plan: inpatient   HPI/Subjective: More confused overnight, has 4 limb restraints on  Objective: Filed Vitals:   08/31/13 0419  BP: 136/66  Pulse: 69  Temp: 98.1 F (36.7 C)  Resp: 18    Intake/Output Summary (Last 24 hours) at 08/31/13 1116 Last data filed at 08/31/13 1009  Gross per 24 hour  Intake   1080 ml  Output   1750 ml  Net   -670 ml   Filed Weights   08/28/13 1854  Weight: 93.9 kg (207 lb 0.2 oz)    Exam:   General:  AAOx to self only, confused  Cardiovascular: S1S2/RRR  Respiratory: CTAB  Abdomen: soft, mildly distended  Musculoskeletal: trace edema,   Neuro: no asterixes   Data Reviewed: Basic Metabolic Panel:  Recent Labs Lab 08/28/13 1450 08/29/13 0350 08/30/13 0443  NA 140 143 140  K 5.1 3.9 4.3  CL 107 110 107  CO2 20 19 19   GLUCOSE 159* 145* 117*  BUN 22 22 19   CREATININE 1.73* 1.41* 1.20  CALCIUM 9.1 8.7 8.8   Liver Function Tests:  Recent Labs Lab 08/28/13 1450 08/29/13 0350 08/30/13 0443  AST 43* 39* 42*  ALT 23 21 20    ALKPHOS 87 83 84  BILITOT 1.9* 1.7* 1.9*  PROT 6.7 6.0 6.4  ALBUMIN 3.1* 2.8* 3.0*   No results found for this basename: LIPASE, AMYLASE,  in the last 168 hours  Recent Labs Lab 08/28/13 1525 08/30/13 0443 08/31/13 0532  AMMONIA 125* 145* 63*   CBC:  Recent Labs Lab 08/28/13 1450 08/29/13 0350 08/30/13 0443  WBC 2.8* 3.8* 4.8  NEUTROABS 1.3*  --   --   HGB 11.3* 10.3* 11.2*  HCT 32.1* 30.0* 31.9*  MCV 90.2 91.2 90.4  PLT 52* 53* 65*   Cardiac Enzymes: No results found for this basename: CKTOTAL, CKMB, CKMBINDEX, TROPONINI,  in the last 168 hours BNP (last 3 results) No results found for this basename: PROBNP,  in the last 8760 hours CBG:  Recent Labs Lab 08/30/13 0734 08/30/13 1154 08/30/13 1630 08/30/13 2051 08/31/13 0729  GLUCAP 119* 164* 147* 110* 113*    No results found for this or any previous visit (from the past 240 hour(s)).   Studies: No results found.  Scheduled Meds: . folic acid  1 mg Oral Daily  . insulin aspart  0-15 Units Subcutaneous TID WC  . insulin aspart  4 Units Subcutaneous TID WC  . insulin glargine  45 Units Subcutaneous QHS  . lactulose  20 g Oral BID  . lisinopril  20 mg Oral Daily  . pantoprazole  40 mg Oral BID  . propranolol  20 mg Oral BID  . rifaximin  550 mg Oral  BID  . spironolactone  100 mg Oral Daily   Continuous Infusions:  Antibiotics Given (last 72 hours)   Date/Time Action Medication Dose   08/28/13 2257 Given   rifaximin (XIFAXAN) tablet 550 mg 550 mg   08/29/13 1035 Given   rifaximin (XIFAXAN) tablet 550 mg 550 mg   08/29/13 2155 Given   rifaximin (XIFAXAN) tablet 550 mg 550 mg   08/30/13 4332 Given   rifaximin (XIFAXAN) tablet 550 mg 550 mg   08/30/13 2211 Given   rifaximin (XIFAXAN) tablet 550 mg 550 mg   08/31/13 1023 Given   rifaximin (XIFAXAN) tablet 550 mg 550 mg      Principal Problem:   Acute encephalopathy Active Problems:   Diabetes mellitus   Thrombocytopenia   Cirrhosis    Tobacco abuse   Hepatic encephalopathy   Pancytopenia   Encephalopathy, hepatic    Time spent: 23min    Domenic Polite  [facility] Pager [phone]. If 7PM-7AM, please contact night-coverage at [email or web address], password [facility] 08/31/2013, 11:16 AM  LOS: 3 days

## 2013-09-01 LAB — CBC
HEMATOCRIT: 32.5 % — AB (ref 39.0–52.0)
Hemoglobin: 11.4 g/dL — ABNORMAL LOW (ref 13.0–17.0)
MCH: 31.5 pg (ref 26.0–34.0)
MCHC: 35.1 g/dL (ref 30.0–36.0)
MCV: 89.8 fL (ref 78.0–100.0)
PLATELETS: 53 10*3/uL — AB (ref 150–400)
RBC: 3.62 MIL/uL — AB (ref 4.22–5.81)
RDW: 15.9 % — AB (ref 11.5–15.5)
WBC: 4.6 10*3/uL (ref 4.0–10.5)

## 2013-09-01 LAB — BASIC METABOLIC PANEL
BUN: 10 mg/dL (ref 6–23)
CALCIUM: 9.1 mg/dL (ref 8.4–10.5)
CHLORIDE: 106 meq/L (ref 96–112)
CO2: 19 mEq/L (ref 19–32)
CREATININE: 0.9 mg/dL (ref 0.50–1.35)
GFR calc non Af Amer: >90 mL/min (ref >90)
Glucose, Bld: 76 mg/dL (ref 70–99)
Potassium: 3.7 mEq/L (ref 3.7–5.3)
Sodium: 137 mEq/L (ref 137–147)

## 2013-09-01 LAB — GLUCOSE, CAPILLARY
GLUCOSE-CAPILLARY: 70 mg/dL (ref 70–99)
GLUCOSE-CAPILLARY: 172 mg/dL — AB (ref 70–99)
GLUCOSE-CAPILLARY: 152 mg/dL — AB (ref 70–99)
Glucose-Capillary: 160 mg/dL — ABNORMAL HIGH (ref 70–99)

## 2013-09-01 MED ORDER — HALOPERIDOL LACTATE 5 MG/ML IJ SOLN
1.0000 mg | Freq: Four times a day (QID) | INTRAMUSCULAR | Status: DC | PRN
  Administered 2013-09-01 – 2013-09-04 (×5): 1 mg via INTRAVENOUS
  Filled 2013-09-01 (×5): qty 1

## 2013-09-01 MED ORDER — SODIUM CHLORIDE 0.9 % IJ SOLN
3.0000 mL | INTRAMUSCULAR | Status: DC | PRN
  Administered 2013-09-01: 3 mL via INTRAVENOUS

## 2013-09-01 MED ORDER — SODIUM CHLORIDE 0.9 % IJ SOLN
3.0000 mL | Freq: Two times a day (BID) | INTRAMUSCULAR | Status: DC
  Administered 2013-09-01 – 2013-09-05 (×8): 3 mL via INTRAVENOUS

## 2013-09-01 NOTE — Progress Notes (Signed)
[facility] PROGRESS NOTE  [name] VVO:[id number] DOB: November 03, 1955 DOA: 08/28/2013 PCP: Barbette Merino, MD  Assessment/Plan:  Hepatic encephalopathy  -This is a recurrent problem, stopped the lactulose about 5 days ago because of profuse diarrhea; likely precipitated this event.  - importance of compliance with lactulose re-emphasized  -Continue rifaximin, added lactulose -ammonia level improving  -off IVF now -more agitation overnight DC xanax and morphine, Haldol PRN -Sitter at bedside  Pancytopenia  -Related to cirrhosis.   Cirrhosis  -Secondary to alcoholism, reportedly quit drinking few months ago need to confirm this with girlfriend  Diabetes  -hemoglobin A1c 8.3 -Continue home dose of Lantus.  -Sliding scale insulin   Thrombocytopenia -chronic, stable  DVT prophylaxis  -SCDs given thrombocytopenia  Pt/Ot/Ambulate  Code Status: FUll COde Family Communication:none at bedside Disposition Plan: SNF when stable   HPI/Subjective: confused and agitated overnight  Objective: Filed Vitals:   09/01/13 0526  BP: 131/66  Pulse: 110  Temp: 98.1 F (36.7 C)  Resp: 18    Intake/Output Summary (Last 24 hours) at 09/01/13 1056 Last data filed at 09/01/13 0939  Gross per 24 hour  Intake    770 ml  Output   1875 ml  Net  -1105 ml   Filed Weights   08/28/13 1854  Weight: 93.9 kg (207 lb 0.2 oz)    Exam:   General:  AAOx to self only, confused  Cardiovascular: S1S2/RRR  Respiratory: CTAB  Abdomen: soft, mildly distended  Musculoskeletal: trace edema,   Neuro: no asterixes   Data Reviewed: Basic Metabolic Panel:  Recent Labs Lab 08/28/13 1450 08/29/13 0350 08/30/13 0443 09/01/13 0536  NA 140 143 140 137  K 5.1 3.9 4.3 3.7  CL 107 110 107 106  CO2 20 19 19 19   GLUCOSE 159* 145* 117* 76  BUN 22 22 19 10   CREATININE 1.73* 1.41* 1.20 0.90  CALCIUM 9.1 8.7 8.8 9.1   Liver Function Tests:  Recent Labs Lab 08/28/13 1450  08/29/13 0350 08/30/13 0443  AST 43* 39* 42*  ALT 23 21 20   ALKPHOS 87 83 84  BILITOT 1.9* 1.7* 1.9*  PROT 6.7 6.0 6.4  ALBUMIN 3.1* 2.8* 3.0*   No results found for this basename: LIPASE, AMYLASE,  in the last 168 hours  Recent Labs Lab 08/28/13 1525 08/30/13 0443 08/31/13 0532  AMMONIA 125* 145* 63*   CBC:  Recent Labs Lab 08/28/13 1450 08/29/13 0350 08/30/13 0443 09/01/13 0536  WBC 2.8* 3.8* 4.8 4.6  NEUTROABS 1.3*  --   --   --   HGB 11.3* 10.3* 11.2* 11.4*  HCT 32.1* 30.0* 31.9* 32.5*  MCV 90.2 91.2 90.4 89.8  PLT 52* 53* 65* 53*   Cardiac Enzymes: No results found for this basename: CKTOTAL, CKMB, CKMBINDEX, TROPONINI,  in the last 168 hours BNP (last 3 results) No results found for this basename: PROBNP,  in the last 8760 hours CBG:  Recent Labs Lab 08/31/13 0729 08/31/13 1204 08/31/13 1737 08/31/13 2152 09/01/13 0721  GLUCAP 113* 94 128* 127* 70    No results found for this or any previous visit (from the past 240 hour(s)).   Studies: No results found.  Scheduled Meds: . folic acid  1 mg Oral Daily  . insulin aspart  0-15 Units Subcutaneous TID WC  . insulin aspart  4 Units Subcutaneous TID WC  . insulin glargine  45 Units Subcutaneous QHS  . lactulose  20 g Oral BID  . lisinopril  20 mg Oral Daily  .  pantoprazole  40 mg Oral BID  . propranolol  20 mg Oral BID  . rifaximin  550 mg Oral BID  . spironolactone  100 mg Oral Daily   Continuous Infusions:  Antibiotics Given (last 72 hours)   Date/Time Action Medication Dose   08/29/13 2155 Given   rifaximin (XIFAXAN) tablet 550 mg 550 mg   08/30/13 0923 Given   rifaximin (XIFAXAN) tablet 550 mg 550 mg   08/30/13 2211 Given   rifaximin (XIFAXAN) tablet 550 mg 550 mg   08/31/13 1023 Given   rifaximin (XIFAXAN) tablet 550 mg 550 mg   08/31/13 2045 Given   rifaximin (XIFAXAN) tablet 550 mg 550 mg   09/01/13 0920 Given   rifaximin (XIFAXAN) tablet 550 mg 550 mg      Principal  Problem:   Acute encephalopathy Active Problems:   Diabetes mellitus   Thrombocytopenia   Cirrhosis   Tobacco abuse   Hepatic encephalopathy   Pancytopenia   Encephalopathy, hepatic    Time spent: 36min    Domenic Polite  [facility] Pager [phone]. If 7PM-7AM, please contact night-coverage at [email or web address], password [facility] 09/01/2013, 10:56 AM  LOS: 4 days

## 2013-09-01 NOTE — Progress Notes (Signed)
Pt. Has sitter at bedside.  Currently in 4 point restraints plus waist belt.  Will d/c restraints at this time and monitor patient.

## 2013-09-02 DIAGNOSIS — K7460 Unspecified cirrhosis of liver: Secondary | ICD-10-CM

## 2013-09-02 LAB — GLUCOSE, CAPILLARY
Glucose-Capillary: 91 mg/dL (ref 70–99)
Glucose-Capillary: 155 mg/dL — ABNORMAL HIGH (ref 70–99)
Glucose-Capillary: 148 mg/dL — ABNORMAL HIGH (ref 70–99)
Glucose-Capillary: 149 mg/dL — ABNORMAL HIGH (ref 70–99)

## 2013-09-02 LAB — BASIC METABOLIC PANEL
BUN: 14 mg/dL (ref 6–23)
CALCIUM: 8.8 mg/dL (ref 8.4–10.5)
CO2: 21 meq/L (ref 19–32)
Chloride: 104 mEq/L (ref 96–112)
Creatinine, Ser: 1.07 mg/dL (ref 0.50–1.35)
GFR calc Af Amer: 87 mL/min — ABNORMAL LOW (ref >90)
GFR, EST NON AFRICAN AMERICAN: 75 mL/min — AB (ref >90)
GLUCOSE: 94 mg/dL (ref 70–99)
Potassium: 4 mEq/L (ref 3.7–5.3)
SODIUM: 136 meq/L — AB (ref 137–147)

## 2013-09-02 LAB — CBC
HCT: 32.4 % — ABNORMAL LOW (ref 39.0–52.0)
HEMOGLOBIN: 11.4 g/dL — AB (ref 13.0–17.0)
MCH: 31.8 pg (ref 26.0–34.0)
MCHC: 35.2 g/dL (ref 30.0–36.0)
MCV: 90.5 fL (ref 78.0–100.0)
PLATELETS: 52 10*3/uL — AB (ref 150–400)
RBC: 3.58 MIL/uL — AB (ref 4.22–5.81)
RDW: 16.2 % — ABNORMAL HIGH (ref 11.5–15.5)
WBC: 5.2 10*3/uL (ref 4.0–10.5)

## 2013-09-02 MED ORDER — ALPRAZOLAM 0.5 MG PO TABS
0.5000 mg | ORAL_TABLET | Freq: Every evening | ORAL | Status: DC | PRN
  Administered 2013-09-02: 0.5 mg via ORAL
  Filled 2013-09-02: qty 1

## 2013-09-02 NOTE — Progress Notes (Signed)
[facility] PROGRESS NOTE  [name] ZOX:[id number] DOB: 10-18-1955 DOA: 08/28/2013 PCP: Barbette Merino, MD  Assessment/Plan:  Hepatic encephalopathy  -This is a recurrent problem, stopped the lactulose about 5 days prior to admission because of profuse diarrhea; likely precipitated this event.  - importance of compliance with lactulose re-emphasized  -Continue rifaximin, added lactulose -ammonia level improving  -off IVF now -more agitation overnight 4/11, stopped xanax and morphine, added Haldol PRN -Sitter at bedside -DC sitter today, frequent reorientation  Pancytopenia  -Related to cirrhosis.   Cirrhosis  -Secondary to alcoholism, reportedly quit drinking few months ago   Diabetes  -hemoglobin A1c 8.3 -Continue home dose of Lantus.  -Sliding scale insulin   Thrombocytopenia -chronic, stable  DVT prophylaxis  -SCDs given thrombocytopenia  Pt/Ot/Ambulate  Code Status: FUll COde Family Communication:none at bedside Disposition Plan: SNF when stable   HPI/Subjective: confused and agitated overnight 4/11, still sleepy from 3am dose of Haldol, still with sitter  Objective: Filed Vitals:   09/02/13 0558  BP: 95/34  Pulse: 56  Temp: 98.1 F (36.7 C)  Resp: 20    Intake/Output Summary (Last 24 hours) at 09/02/13 0924 Last data filed at 09/02/13 0830  Gross per 24 hour  Intake   1476 ml  Output   1250 ml  Net    226 ml   Filed Weights   08/28/13 1854  Weight: 93.9 kg (207 lb 0.2 oz)    Exam:   General:  AAOx to self only, confused  Cardiovascular: S1S2/RRR  Respiratory: CTAB  Abdomen: soft, mildly distended  Musculoskeletal: trace edema,   Neuro: no asterixes   Data Reviewed: Basic Metabolic Panel:  Recent Labs Lab 08/28/13 1450 08/29/13 0350 08/30/13 0443 09/01/13 0536 09/02/13 0340  NA 140 143 140 137 136*  K 5.1 3.9 4.3 3.7 4.0  CL 107 110 107 106 104  CO2 20 19 19 19 21   GLUCOSE 159* 145* 117* 76 94  BUN 22 22 19 10 14    CREATININE 1.73* 1.41* 1.20 0.90 1.07  CALCIUM 9.1 8.7 8.8 9.1 8.8   Liver Function Tests:  Recent Labs Lab 08/28/13 1450 08/29/13 0350 08/30/13 0443  AST 43* 39* 42*  ALT 23 21 20   ALKPHOS 87 83 84  BILITOT 1.9* 1.7* 1.9*  PROT 6.7 6.0 6.4  ALBUMIN 3.1* 2.8* 3.0*   No results found for this basename: LIPASE, AMYLASE,  in the last 168 hours  Recent Labs Lab 08/28/13 1525 08/30/13 0443 08/31/13 0532  AMMONIA 125* 145* 63*   CBC:  Recent Labs Lab 08/28/13 1450 08/29/13 0350 08/30/13 0443 09/01/13 0536 09/02/13 0340  WBC 2.8* 3.8* 4.8 4.6 5.2  NEUTROABS 1.3*  --   --   --   --   HGB 11.3* 10.3* 11.2* 11.4* 11.4*  HCT 32.1* 30.0* 31.9* 32.5* 32.4*  MCV 90.2 91.2 90.4 89.8 90.5  PLT 52* 53* 65* 53* 52*   Cardiac Enzymes: No results found for this basename: CKTOTAL, CKMB, CKMBINDEX, TROPONINI,  in the last 168 hours BNP (last 3 results) No results found for this basename: PROBNP,  in the last 8760 hours CBG:  Recent Labs Lab 09/01/13 0721 09/01/13 1158 09/01/13 1618 09/01/13 2113 09/02/13 0749  GLUCAP 70 160* 172* 152* 91    No results found for this or any previous visit (from the past 240 hour(s)).   Studies: No results found.  Scheduled Meds: . folic acid  1 mg Oral Daily  . insulin aspart  0-15 Units Subcutaneous TID  WC  . insulin aspart  4 Units Subcutaneous TID WC  . insulin glargine  45 Units Subcutaneous QHS  . lactulose  20 g Oral BID  . lisinopril  20 mg Oral Daily  . pantoprazole  40 mg Oral BID  . propranolol  20 mg Oral BID  . rifaximin  550 mg Oral BID  . sodium chloride  3 mL Intravenous Q12H  . spironolactone  100 mg Oral Daily   Continuous Infusions:  Antibiotics Given (last 72 hours)   Date/Time Action Medication Dose   08/30/13 2211 Given   rifaximin (XIFAXAN) tablet 550 mg 550 mg   08/31/13 1023 Given   rifaximin (XIFAXAN) tablet 550 mg 550 mg   08/31/13 2045 Given   rifaximin (XIFAXAN) tablet 550 mg 550 mg    09/01/13 0920 Given   rifaximin (XIFAXAN) tablet 550 mg 550 mg   09/01/13 2129 Given   rifaximin (XIFAXAN) tablet 550 mg 550 mg      Principal Problem:   Acute encephalopathy Active Problems:   Diabetes mellitus   Thrombocytopenia   Cirrhosis   Tobacco abuse   Hepatic encephalopathy   Pancytopenia   Encephalopathy, hepatic    Time spent: 40min    Domenic Polite  [facility] Pager [phone]. If 7PM-7AM, please contact night-coverage at [email or web address], password [facility] 09/02/2013, 9:24 AM  LOS: 5 days

## 2013-09-02 NOTE — Social Work (Signed)
Sent information to [facility] to initiate authorization.  Erwin Nishiyama C. Thurmond MSW, [place]

## 2013-09-02 NOTE — Progress Notes (Signed)
[facility] has obtained authorization for patient to be transferred there tomorrow.  Shea Swalley C. Shepherd MSW, [place]

## 2013-09-03 LAB — BASIC METABOLIC PANEL
BUN: 17 mg/dL (ref 6–23)
CO2: 24 meq/L (ref 19–32)
CREATININE: 1.16 mg/dL (ref 0.50–1.35)
Calcium: 9.1 mg/dL (ref 8.4–10.5)
Chloride: 103 mEq/L (ref 96–112)
GFR calc Af Amer: 79 mL/min — ABNORMAL LOW (ref >90)
GFR, EST NON AFRICAN AMERICAN: 68 mL/min — AB (ref >90)
GLUCOSE: 100 mg/dL — AB (ref 70–99)
Potassium: 4.4 mEq/L (ref 3.7–5.3)
Sodium: 137 mEq/L (ref 137–147)

## 2013-09-03 LAB — GLUCOSE, CAPILLARY
GLUCOSE-CAPILLARY: 161 mg/dL — AB (ref 70–99)
Glucose-Capillary: 98 mg/dL (ref 70–99)
Glucose-Capillary: 136 mg/dL — ABNORMAL HIGH (ref 70–99)
Glucose-Capillary: 142 mg/dL — ABNORMAL HIGH (ref 70–99)

## 2013-09-03 LAB — AMMONIA: Ammonia: 90 umol/L — ABNORMAL HIGH (ref 11–60)

## 2013-09-03 MED ORDER — DICLOFENAC SODIUM 1 % TD GEL
2.0000 g | Freq: Once | TRANSDERMAL | Status: AC
  Administered 2013-09-03: 2 g via TOPICAL
  Filled 2013-09-03: qty 100

## 2013-09-03 MED ORDER — ALPRAZOLAM 0.5 MG PO TABS
0.5000 mg | ORAL_TABLET | Freq: Every evening | ORAL | Status: DC | PRN
  Administered 2013-09-03: 0.5 mg via ORAL
  Filled 2013-09-03: qty 1

## 2013-09-03 MED ORDER — DICLOFENAC SODIUM 1 % TD GEL
2.0000 g | Freq: Two times a day (BID) | TRANSDERMAL | Status: DC | PRN
Start: 2013-09-03 — End: 2013-09-05
  Administered 2013-09-03 – 2013-09-04 (×2): 2 g via TOPICAL
  Filled 2013-09-03: qty 100

## 2013-09-03 MED ORDER — LACTULOSE 10 GM/15ML PO SOLN
20.0000 g | Freq: Three times a day (TID) | ORAL | Status: DC
  Filled 2013-09-03 (×3): qty 30

## 2013-09-03 MED ORDER — LACTULOSE 10 GM/15ML PO SOLN
20.0000 g | Freq: Four times a day (QID) | ORAL | Status: DC
  Administered 2013-09-03 – 2013-09-04 (×5): 20 g via ORAL
  Filled 2013-09-03 (×10): qty 30

## 2013-09-03 NOTE — Progress Notes (Signed)
Pt requested 0.5 mg of Xanax at bedtime for sleep. On call NP was paged and gave order for Xanax. RN gave Xanax to pt. A short time later, NP called RN back and stated that the Xanax had previously been D/C'd by attending MD due to worsening encephalopathy. However, since the Xanax had already been given, NP gave orders to continue to monitor the pt. Pt alert and oriented x4 before and after the administration of Xanax. Pt is still alert and oriented. No distress noted at this time. Will continue to monitor pt closely. Helayne Seminole

## 2013-09-03 NOTE — Progress Notes (Signed)
Physical Therapy Treatment Patient Details Name: [name] MRN: [id number] DOB: Sep 17, 1955 Today's Date: 09/03/2013    History of Present Illness 58 yo male was admitted with weakness and confusion.  He had a lot of diarrhea prior to admission and has a h/o ETOH cirrhosis and hepatic encephalopathy    PT Comments    Assisted pt OOB to amb in hallway.  Did better amb with out RW with + 2 hand held assist.  Very unsteady gait planning to D/C to SNF for ST Rehab.  Follow Up Recommendations  SNF ([facility])     Equipment Recommendations       Recommendations for Other Services       Precautions / Restrictions Precautions Precautions: Fall Restrictions Weight Bearing Restrictions: No    Mobility  Bed Mobility Overal bed mobility: Needs Assistance       Supine to sit: Min assist;Mod assist     General bed mobility comments: increased time and.used bed pad to assist with scooting.  Pt sat EOB for several min before he was able to self sit upright at Supervision level.    Transfers Overall transfer level: Needs assistance Equipment used: Rolling walker (2 wheeled);None Transfers: Sit to/from Stand Sit to Stand: +2 safety/equipment;Mod assist;Min assist         General transfer comment: repeat cueing to stay on task and cueing for safety with turns  Ambulation/Gait Ambulation/Gait assistance: Mod assist;+2 physical assistance Ambulation Distance (Feet): 125 Feet Assistive device: Rolling walker (2 wheeled);None Gait Pattern/deviations: Step-to pattern;Step-through pattern;Shuffle;Staggering left;Staggering right Gait velocity: decreased   General Gait Details: amb with RW pt required cueing to advance correctly/safely with difficulty grasping esp with L hand.  Amb w/o + 2 HHA pt did better.  Still, unsteady/staggered gait requiring + 2 for safety.   Impaired safety cognition.  HIGH FALL RISK   Stairs            Wheelchair Mobility    Modified Rankin  (Stroke Patients Only)       Balance                                    Cognition                            Exercises      General Comments        Pertinent Vitals/Pain     Home Living                      Prior Function            PT Goals (current goals can now be found in the care plan section) Progress towards PT goals: Progressing toward goals    Frequency  Min 3X/week    PT Plan      Co-evaluation             End of Session Equipment Utilized During Treatment: Gait belt Activity Tolerance: Patient limited by fatigue Patient left: in bed;with call bell/phone within reach;with bed alarm set     Time: 1005-1033 PT Time Calculation (min): 28 min  Charges:  $Gait Training: 8-22 mins $Therapeutic Activity: 8-22 mins                    G Codes:      Rica Koyanagi  PTA [facility]  Acute  Rehab Pager      [phone]

## 2013-09-03 NOTE — Progress Notes (Signed)
Pt was alert and oriented throughout shift. Condom catheter kept being removed, so pt was taught how to use a urinal. Pt was advised that if he needed to stand up to use the urinal, he should use his call light first. Will continue to monitor pt closely. Helayne Seminole

## 2013-09-03 NOTE — Progress Notes (Signed)
[facility] PROGRESS NOTE  [name]:[id number] DOB: April 21, 1956 DOA: 08/28/2013 PCP: Barbette Merino, MD Brief Narrative Patient is a 58 year old white man with history of alcoholic cirrhosis and multiple admissions to the hospital for hepatic encephalopathy most recently on 08/09/2013. Per girlfriend for 3 days PTA he was having increased diarrhea which forced her to discontinue the lactulose and give him several antidiarrheal tablets. Ever since then he has become more confused and weaker.  [name] reported that he would benefit from nursing home placement as she can no longer care for him given his repeated bouts of weakness and confusion. His hospital course has been significant for lethargy and confusion, ammonia level improved and then worsened a little   Assessment/Plan:  Hepatic encephalopathy  -This is a recurrent problem, stopped the lactulose about 5 days prior to admission because of profuse diarrhea; likely precipitated this event.  - importance of compliance with lactulose re-emphasized  -Continue rifaximin, added lactulose -ammonia improved, but back up again, will increase dose of lactulose -off IVF now -more agitation overnight 4/11, cut down xanax and morphine, added Haldol PRN -had Sitter at bedside which was discontinued yesterday -frequent reorientation, change xanax to QHS PRN, reportedly takes this chronically at home  Pancytopenia  -Related to cirrhosis.   Cirrhosis  -Secondary to alcoholism, reportedly quit drinking few months ago   Diabetes  -hemoglobin A1c 8.3 -Continue home dose of Lantus.  -Sliding scale insulin   Thrombocytopenia -chronic, stable  DVT prophylaxis  -SCDs given thrombocytopenia  Pt/Ot/Ambulate  Code Status: FU[place] COde Family Communication:none at bedside Disposition Plan: SNF when stable   HPI/Subjective: Was more lucid last evening, slept some, sleepy this am and oriented to self only  Objective: Filed Vitals:   09/03/13 0530  BP: 104/53  Pulse: 55  Temp: 97.9 F (36.6 C)  Resp: 20    Intake/Output Summary (Last 24 hours) at 09/03/13 0952 Last data filed at 09/03/13 0800  Gross per 24 hour  Intake   1320 ml  Output   1400 ml  Net    -80 ml   Filed Weights   08/28/13 1854  Weight: 93.9 kg (207 lb 0.2 oz)    Exam:   General:  Sleepy, arousible, oriented to self only, less confused than yesterday  Cardiovascular: S1S2/RRR  Respiratory: CTAB  Abdomen: soft, mildly distended  Musculoskeletal: trace edema,   Neuro: no asterixes today  Data Reviewed: Basic Metabolic Panel:  Recent Labs Lab 08/29/13 0350 08/30/13 0443 09/01/13 0536 09/02/13 0340 09/03/13 0453  NA 143 140 137 136* 137  K 3.9 4.3 3.7 4.0 4.4  CL 110 107 106 104 103  CO2 19 19 19 21 24   GLUCOSE 145* 117* 76 94 100*  BUN 22 19 10 14 17   CREATININE 1.41* 1.20 0.90 1.07 1.16  CALCIUM 8.7 8.8 9.1 8.8 9.1   Liver Function Tests:  Recent Labs Lab 08/28/13 1450 08/29/13 0350 08/30/13 0443  AST 43* 39* 42*  ALT 23 21 20   ALKPHOS 87 83 84  BILITOT 1.9* 1.7* 1.9*  PROT 6.7 6.0 6.4  ALBUMIN 3.1* 2.8* 3.0*   No results found for this basename: LIPASE, AMYLASE,  in the last 168 hours  Recent Labs Lab 08/28/13 1525 08/30/13 0443 08/31/13 0532 09/03/13 0700  AMMONIA 125* 145* 63* 90*   CBC:  Recent Labs Lab 08/28/13 1450 08/29/13 0350 08/30/13 0443 09/01/13 0536 09/02/13 0340  WBC 2.8* 3.8* 4.8 4.6 5.2  NEUTROABS 1.3*  --   --   --   --  HGB 11.3* 10.3* 11.2* 11.4* 11.4*  HCT 32.1* 30.0* 31.9* 32.5* 32.4*  MCV 90.2 91.2 90.4 89.8 90.5  PLT 52* 53* 65* 53* 52*   Cardiac Enzymes: No results found for this basename: CKTOTAL, CKMB, CKMBINDEX, TROPONINI,  in the last 168 hours BNP (last 3 results) No results found for this basename: PROBNP,  in the last 8760 hours CBG:  Recent Labs Lab 09/02/13 0749 09/02/13 1137 09/02/13 1712 09/02/13 2156 09/03/13 0701  GLUCAP 91 155* 148* 149*  98    No results found for this or any previous visit (from the past 240 hour(s)).   Studies: No results found.  Scheduled Meds: . folic acid  1 mg Oral Daily  . insulin aspart  0-15 Units Subcutaneous TID WC  . insulin aspart  4 Units Subcutaneous TID WC  . insulin glargine  45 Units Subcutaneous QHS  . lactulose  20 g Oral QID  . lisinopril  20 mg Oral Daily  . pantoprazole  40 mg Oral BID  . propranolol  20 mg Oral BID  . rifaximin  550 mg Oral BID  . sodium chloride  3 mL Intravenous Q12H  . spironolactone  100 mg Oral Daily   Continuous Infusions:  Antibiotics Given (last 72 hours)   Date/Time Action Medication Dose   08/31/13 1023 Given   rifaximin (XIFAXAN) tablet 550 mg 550 mg   08/31/13 2045 Given   rifaximin (XIFAXAN) tablet 550 mg 550 mg   09/01/13 0920 Given   rifaximin (XIFAXAN) tablet 550 mg 550 mg   09/01/13 2129 Given   rifaximin (XIFAXAN) tablet 550 mg 550 mg   09/02/13 7124 Given   rifaximin (XIFAXAN) tablet 550 mg 550 mg   09/02/13 2156 Given   rifaximin (XIFAXAN) tablet 550 mg 550 mg      Principal Problem:   Acute encephalopathy Active Problems:   Diabetes mellitus   Thrombocytopenia   Cirrhosis   Tobacco abuse   Hepatic encephalopathy   Pancytopenia   Encephalopathy, hepatic    Time spent: 2min    Domenic Polite  [facility] Pager [phone]. If 7PM-7AM, please contact night-coverage at [email or web address], password [facility] 09/03/2013, 9:52 AM  LOS: 6 days

## 2013-09-04 LAB — GLUCOSE, CAPILLARY
GLUCOSE-CAPILLARY: 224 mg/dL — AB (ref 70–99)
GLUCOSE-CAPILLARY: 133 mg/dL — AB (ref 70–99)
Glucose-Capillary: 128 mg/dL — ABNORMAL HIGH (ref 70–99)
Glucose-Capillary: 138 mg/dL — ABNORMAL HIGH (ref 70–99)
Glucose-Capillary: 192 mg/dL — ABNORMAL HIGH (ref 70–99)

## 2013-09-04 LAB — BASIC METABOLIC PANEL
BUN: 21 mg/dL (ref 6–23)
CHLORIDE: 104 meq/L (ref 96–112)
CO2: 21 mEq/L (ref 19–32)
Calcium: 9 mg/dL (ref 8.4–10.5)
Creatinine, Ser: 1.18 mg/dL (ref 0.50–1.35)
GFR, EST AFRICAN AMERICAN: 77 mL/min — AB (ref >90)
GFR, EST NON AFRICAN AMERICAN: 67 mL/min — AB (ref >90)
Glucose, Bld: 157 mg/dL — ABNORMAL HIGH (ref 70–99)
POTASSIUM: 4.3 meq/L (ref 3.7–5.3)
SODIUM: 137 meq/L (ref 137–147)

## 2013-09-04 LAB — AMMONIA: AMMONIA: 148 umol/L — AB (ref 11–60)

## 2013-09-04 MED ORDER — LACTULOSE 10 GM/15ML PO SOLN
30.0000 g | Freq: Four times a day (QID) | ORAL | Status: DC
  Administered 2013-09-04 – 2013-09-05 (×5): 30 g via ORAL
  Filled 2013-09-04 (×7): qty 45

## 2013-09-04 MED ORDER — SODIUM CHLORIDE 0.9 % IV SOLN
INTRAVENOUS | Status: DC | PRN
  Administered 2013-09-04: 21:00:00 via INTRAVENOUS

## 2013-09-04 NOTE — Progress Notes (Signed)
Patient ID: [name], male   DOB: 16-Jan-1956, 58 y.o.   MRN: [id number]  [facility] PROGRESS NOTE  [name]:[id number] DOB: Feb 27, 1956 DOA: 08/28/2013 PCP: Barbette Merino, MD  Brief narrative: Patient is a 58 year old white man with history of alcoholic cirrhosis and multiple admissions to the hospital for hepatic encephalopathy most recently on 08/09/2013. Pt apparently had persistent diarrhea and was told to stop taking lactulose and since that time, pt apparently became more confused   Assessment and plan: Hepatic encephalopathy  -This is a recurrent problem, stopped the lactulose about 5 days prior to admission because of profuse diarrhea; likely precipitated this event.  - Importance of compliance with lactulose re-emphasized  - Continue rifaximin, added lactulose and since ammonia level is up over the past 24 hours, will increase to dose of Lactulose to 30 gm Pancytopenia  - Related to cirrhosis.  - Remains at baseline  Cirrhosis  - Secondary to alcoholism, reportedly quit drinking few months ago  Diabetes  - hemoglobin A1c 8.3  - Continue home dose of Lantus.  - Sliding scale insulin  Thrombocytopenia  - chronic, stable  DVT prophylaxis  - SCDs given thrombocytopenia  Moderate malnutrition - secondary to chronic illness as noted above - advance diet as pt able to tolerate   Consultants:  None Procedures/Studies:  None  Antibiotics:  None   Code Status: Full Family Communication: Pt at bedside Disposition Plan: SNF when medically stable   HPI/Subjective: No events overnight. Pt says he is more tired and weak.   Objective: Filed Vitals:   09/04/13 0825 09/04/13 1025 09/04/13 1038 09/04/13 1350  BP:  117/60 117/60 116/51  Pulse:   60 59  Temp: 97.8 F (36.6 C)   97.5 F (36.4 C)  TempSrc: Oral   Oral  Resp:    13  Height:      Weight:      SpO2:    100%    Intake/Output Summary (Last 24 hours) at 09/04/13 1439 Last data filed at  09/04/13 0900  Gross per 24 hour  Intake    603 ml  Output    575 ml  Net     28 ml    Exam:   General:  Pt is alert, follows commands appropriately, not in acute distress  Cardiovascular: Regular rate and rhythm, S1/S2, no murmurs, no rubs, no gallops  Respiratory: Clear to auscultation bilaterally, no wheezing, no crackles, no rhonchi  Abdomen: Soft, non tender, non distended, bowel sounds present, no guarding  Data Reviewed: Basic Metabolic Panel:  Recent Labs Lab 08/30/13 0443 09/01/13 0536 09/02/13 0340 09/03/13 0453 09/04/13 0415  NA 140 137 136* 137 137  K 4.3 3.7 4.0 4.4 4.3  CL 107 106 104 103 104  CO2 19 19 21 24 21   GLUCOSE 117* 76 94 100* 157*  BUN 19 10 14 17 21   CREATININE 1.20 0.90 1.07 1.16 1.18  CALCIUM 8.8 9.1 8.8 9.1 9.0   Liver Function Tests:  Recent Labs Lab 08/28/13 1450 08/29/13 0350 08/30/13 0443  AST 43* 39* 42*  ALT 23 21 20   ALKPHOS 87 83 84  BILITOT 1.9* 1.7* 1.9*  PROT 6.7 6.0 6.4  ALBUMIN 3.1* 2.8* 3.0*    Recent Labs Lab 08/28/13 1525 08/30/13 0443 08/31/13 0532 09/03/13 0700 09/04/13 0415  AMMONIA 125* 145* 63* 90* 148*   CBC:  Recent Labs Lab 08/28/13 1450 08/29/13 0350 08/30/13 0443 09/01/13 0536 09/02/13 0340  WBC 2.8* 3.8* 4.8 4.6 5.2  NEUTROABS 1.3*  --   --   --   --   HGB 11.3* 10.3* 11.2* 11.4* 11.4*  HCT 32.1* 30.0* 31.9* 32.5* 32.4*  MCV 90.2 91.2 90.4 89.8 90.5  PLT 52* 53* 65* 53* 52*   CBG:  Recent Labs Lab 09/03/13 1703 09/03/13 2117 09/04/13 0714 09/04/13 1146 09/04/13 1300  GLUCAP 136* 142* 224* 133* 128*   Scheduled Meds: . folic acid  1 mg Oral Daily  . insulin aspart  0-15 Units Subcutaneous TID WC  . insulin aspart  4 Units Subcutaneous TID WC  . insulin glargine  45 Units Subcutaneous QHS  . lactulose  30 g Oral QID  . lisinopril  20 mg Oral Daily  . pantoprazole  40 mg Oral BID  . propranolol  20 mg Oral BID  . rifaximin  550 mg Oral BID  . sodium chloride  3 mL  Intravenous Q12H  . spironolactone  100 mg Oral Daily   Continuous Infusions:  Theodis Blaze, MD  [facility] Pager [phone]  If 7PM-7AM, please contact night-coverage [email or web address] Password [email or web address] 09/04/2013, 2:39 PM   LOS: 7 days

## 2013-09-04 NOTE — Progress Notes (Signed)
Occupational Therapy Treatment Patient Details Name: NOCHUM FENTER MRN: [id number] DOB: [birth year] Today's Date: 09/04/2013    History of present illness 58 yo male was admitted with weakness and confusion.  He had a lot of diarrhea prior to admission and has a h/o ETOH cirrhosis and hepatic encephalopathy   OT comments  Patient presents with significant confusion and L arm pain. Continue to recommend SNF placement.  Follow Up Recommendations  SNF;Supervision/Assistance - 24 hour    Precautions / Restrictions Precautions Precautions: Fall Restrictions Weight Bearing Restrictions: No        ADL Overall ADL's : Needs assistance/impaired     Grooming: Wash/dry hands;Wash/dry face;Oral care;Minimal assistance                                 General ADL Comments: Patient with significant L arm pain (shoulder to fingers) and could not incorporate LUE into grooming tasks.       Cognition   Behavior During Therapy: Restless Overall Cognitive Status: Impaired/Different from baseline Area of Impairment: Memory;Attention;Safety/judgement;Awareness;Problem solving Orientation Level: Disoriented to;Time;Situation   Memory: Decreased short-term memory  Following Commands: Follows one step commands consistently Safety/Judgement: Decreased awareness of safety   Problem Solving: Requires tactile cues;Requires verbal cues;Slow processing     Exercises General Exercises - Upper Extremity Shoulder Flexion: PROM;Left;20 reps Shoulder Extension: PROM;Left;20 reps Shoulder ABduction: PROM;Left;20 reps Shoulder ADduction: PROM;20 reps;Left Elbow Flexion: PROM;20 reps;Left Elbow Extension: PROM;Left;20 reps Wrist Flexion: PROM;20 reps;Left Wrist Extension: PROM;Left;20 reps Digit Composite Flexion: PROM;Left;20 reps Composite Extension: PROM;Left;15 reps     Pertinent Vitals/ Pain       C/o L arm pain (shoulder to fingers), not rated, improved with PROM   Frequency  Min 2X/week     Progress Toward Goals  OT Goals(current goals can now be found in the care plan section)  Progress towards OT goals: Progressing toward goals     Plan Discharge plan remains appropriate    Patient Left in bed;with call bell/phone within reach;with bed alarm set        Time: 0086-7619 OT Time Calculation (min): 23 min  Charges: OT General Charges $OT Visit: 1 Procedure OT Treatments $Self Care/Home Management : 8-22 mins $Therapeutic Exercise: 8-22 mins  Chestine Belknap A Kiron Osmun 09/04/2013, 3:01 PM

## 2013-09-05 LAB — CBC
HCT: 31.7 % — ABNORMAL LOW (ref 39.0–52.0)
Hemoglobin: 11 g/dL — ABNORMAL LOW (ref 13.0–17.0)
MCH: 31.9 pg (ref 26.0–34.0)
MCHC: 34.7 g/dL (ref 30.0–36.0)
MCV: 91.9 fL (ref 78.0–100.0)
PLATELETS: 58 10*3/uL — AB (ref 150–400)
RBC: 3.45 MIL/uL — ABNORMAL LOW (ref 4.22–5.81)
RDW: 16.7 % — ABNORMAL HIGH (ref 11.5–15.5)
WBC: 5.7 10*3/uL (ref 4.0–10.5)

## 2013-09-05 LAB — COMPREHENSIVE METABOLIC PANEL
ALK PHOS: 85 U/L (ref 39–117)
ALT: 29 U/L (ref 0–53)
AST: 55 U/L — ABNORMAL HIGH (ref 0–37)
Albumin: 2.8 g/dL — ABNORMAL LOW (ref 3.5–5.2)
BUN: 17 mg/dL (ref 6–23)
CALCIUM: 8.9 mg/dL (ref 8.4–10.5)
CHLORIDE: 104 meq/L (ref 96–112)
CO2: 22 meq/L (ref 19–32)
Creatinine, Ser: 1.05 mg/dL (ref 0.50–1.35)
GFR calc Af Amer: 89 mL/min — ABNORMAL LOW (ref >90)
GFR, EST NON AFRICAN AMERICAN: 77 mL/min — AB (ref >90)
GLUCOSE: 127 mg/dL — AB (ref 70–99)
POTASSIUM: 4.2 meq/L (ref 3.7–5.3)
SODIUM: 137 meq/L (ref 137–147)
Total Bilirubin: 1.3 mg/dL — ABNORMAL HIGH (ref 0.3–1.2)
Total Protein: 6.1 g/dL (ref 6.0–8.3)

## 2013-09-05 LAB — GLUCOSE, CAPILLARY: Glucose-Capillary: 201 mg/dL — ABNORMAL HIGH (ref 70–99)

## 2013-09-05 LAB — AMMONIA: Ammonia: 64 umol/L — ABNORMAL HIGH (ref 11–60)

## 2013-09-05 MED ORDER — LACTULOSE 10 GM/15ML PO SOLN: 30.0000 g | Freq: Three times a day (TID) | ORAL | Status: DC

## 2013-09-05 MED ORDER — OXYCODONE-ACETAMINOPHEN 5-325 MG PO TABS
1.0000 | ORAL_TABLET | Freq: Once | ORAL | Status: AC
  Administered 2013-09-05: 2 via ORAL
  Filled 2013-09-05: qty 2

## 2013-09-05 MED ORDER — LISINOPRIL 10 MG PO TABS: 10.0000 mg | ORAL_TABLET | Freq: Every day | ORAL | Status: DC

## 2013-09-05 MED ORDER — ALPRAZOLAM 1 MG PO TABS: 1.0000 mg | ORAL_TABLET | Freq: Two times a day (BID) | ORAL | Status: DC | PRN

## 2013-09-05 MED ORDER — HYDROCODONE-ACETAMINOPHEN 5-325 MG PO TABS: 1.0000 | ORAL_TABLET | Freq: Four times a day (QID) | ORAL | Status: DC | PRN

## 2013-09-05 NOTE — Progress Notes (Signed)
Report called to Carson Myrtle LPN @ [facility]

## 2013-09-05 NOTE — Progress Notes (Signed)
Report received from [facility], Therapist, sports. No changes in assessment. Sitter at bedside. Will continue to monitor pt closely. Helayne [place]inole

## 2013-09-05 NOTE — Discharge Instructions (Signed)
Hepatic Encephalopathy °Hepatic encephalopathy is a syndrome. This is a set of symptoms that occur together. It is seen mostly in patients with damage to the liver known as cirrhosis. This is where normal liver tissue has been replaced by scar tissue.  °Symptoms of the syndrome include: °· Changes in personality. °· Mental impairment. °· A depressed level of consciousness. °These changes occur because toxins build up in the bloodstream. The build up occurs because the scarred liver cannot rid toxins from the body. The most important of these toxins is ammonia. Toxins can cause abnormal behavior and confusion. Toxins in the blood stream can impair your ability to take care of yourself or others. Some people become very sleepy and cannot be woken easily. In severe cases, the patient lapses into a coma.  °CAUSES  °There are many things that can cause liver damage that can lead to buildup of toxins. These include: °· Diseases that cause cirrhosis of the liver. °· Long-term alcohol use with progressive liver damage. °· Hepatitis B or C with ongoing infection and liver damage. °· Patients without cirrhosis who have undergone shunt surgery. °· Kidney failure. °· Bleeding in the stomach or intestines. °· Infection. °· Constipation. °· Medications that act upon the central nervous system. °· Diuretic therapy. °· Excessive dietary protein. °SYMPTOMS  °Symptoms of this syndrome are categorized or "staged" based on severity.  °· Stage 0. Minimal hepatic encephalopathy. No detectable changes in personality or behavior. Minimal changes in memory, concentration, mental function, and physical ability. °· Stage 1. Some lack of awareness. Shortened attention span. Problems with addition or subtraction. Possible problems with sleeping or a reversal of the normal sleep pattern. Euphoria, depression, or irritability may be present. Mild confusion. Slowing of mental ability. Tremors may be detected. °· Stage 2. Lethargy or apathy.  Disoriented. Strange behavior. Slurred speech. Obvious tremors. Drowsiness, unable to perform mental tasks. Personality changes, and confusion about time. °· Stage 3. Very sleepy but can be aroused. Unable to perform mental tasks, cannot keep track of time and place, marked confusion, amnesia, occasional fits of rage, speech cannot be understood. °· Stage 4. Coma with or without response to painful stimuli. °DIAGNOSIS  °In mild cases, a careful history and physical exam may lead your caregiver to consider possible mild hepatic encephalopathy as the cause of symptoms. The diagnosis is clearer in more severe cases. An elevated blood ammonia level is the classic blood test abnormality in patients with this syndrome. Other tests can be helpful to rule out other diseases.  °TREATMENT  °· Medications are often used to lower the ammonia level in the blood. This usually leads to improvement. °· Diets containing vegetable proteins are better than diets rich in animal protein, especially proteins derived from red meats. Eating well-cooked chicken and fish in addition to vegetable protein should be discussed with your caregiver. Malnourished patients are encouraged to add liquid nutritional supplements to their diet. °· Antibiotics are sometimes used to try to lessen the volume of bacteria in the intestines that produce ammonia. °· Moderate to severe cases of this syndrome usually require a hospital stay and medicine that is given directly into a vein (intravenously). °HOME CARE INSTRUCTIONS  °The goal at home is to avoid things that can make the condition worse and lead to a buildup of ammonia in the blood. °· Eat a well balanced diet. Your caregiver can help you with suggestions on this. °· Talk to your caregiver before taking vitamin supplements. Large doses of vitamins and minerals,   especially vitamin A, iron, or copper, can worsen liver damage. °· A low salt diet, water restriction, or diuretic medicine may be needed to  reduce fluid retention. °· Avoid alcohol and acetaminophen as well as any over-the-counter medications that contain acetaminophen (check labels). Only take over-the-counter or prescription medicines for pain, discomfort, or fever as directed by your caregiver. °· Avoid drugs that are toxic to the liver. Review your medications (both prescription and non-prescription) with your caregiver to make sure those you are taking will not be harmful. °· Blood tests may be needed. Follow your caregiver's advice regarding the timing of these. °· With this condition you play a critical role in maintaining your own good health. The failure to follow your caregiver's advice and these instructions may result in permanent disability or death. °SEEK MEDICAL CARE IF:  °· You have increasing fatigue or weakness. °· You develop increasing swelling of the abdomen, hands, feet, legs or face. °· You develop loss of appetite. °· You are feeling sick to your stomach (nausea) and vomiting. °· You develop jaundice. This is a yellow discoloration of the skin. °· You develop worsening problems with concentration, confusion, and/or problems with sleep. °SEEK IMMEDIATE MEDICAL CARE IF:  °· You vomit bright red blood or a coffee ground-looking material. °· You have blood in your stools. Or the stools turn black and tarry. °· You have a fever. °· You develop easy bruising or bleeding. °· You have a return of slurred speech, change in behavior, or confusion. °MAKE SURE YOU:  °· Understand these instructions. °· Will watch your condition. °· Will get help right away if you are not doing well or get worse. °Document Released: 07/19/2006 Document Revised: 08/01/2011 Document Reviewed: 04/25/2007 °[employer]® Patient Information ©2014 [employer]. ° °

## 2013-09-05 NOTE — Discharge Summary (Signed)
Physician Discharge Summary  [name] QBH:[id number] DOB: Jan 17, 1956 DOA: 08/28/2013  PCP: Barbette Merino, MD  Admit date: 08/28/2013 Discharge date: 09/05/2013  Recommendations for Outpatient Follow-up:  1. Pt will need to follow up with PCP in 2-3 weeks post discharge 2. Please obtain BMP to evaluate electrolytes and kidney function 3. Please also check CBC to evaluate Hg and Hct levels  Discharge Diagnoses: Hepatic encephalopathy  Principal Problem:   Acute encephalopathy Active Problems:   Diabetes mellitus   Thrombocytopenia   Cirrhosis   Tobacco abuse   Hepatic encephalopathy   Pancytopenia   Encephalopathy, hepatic  Discharge Condition: Stable  Diet recommendation: Heart healthy diet discussed in details   Brief narrative:  Patient is a 58 year old white man with history of alcoholic cirrhosis and multiple admissions to the hospital for hepatic encephalopathy most recently on 08/09/2013. Pt apparently had persistent diarrhea and was told to stop taking lactulose and since that time, pt apparently became more confused   Assessment and plan:  Hepatic encephalopathy  -This is a recurrent problem, stopped the lactulose about 5 days prior to admission because of profuse diarrhea; likely precipitated this event.  - Importance of compliance with lactulose re-emphasized  - Continue rifaximin, added lactulose 30 gm 4xday, upon discharge will change frequency to TID - please check ammonia level if indicated - ammonia level down 148 --> 64 this AM Pancytopenia  - Related to cirrhosis.  - Remains at baseline  Cirrhosis  - Secondary to alcoholism, reportedly quit drinking few months ago  Diabetes  - hemoglobin A1c 8.3  - Continue home dose of Lantus.  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  - no signs of bleeding  DVT prophylaxis  - SCDs given thrombocytopenia while inpatient  Moderate malnutrition  - secondary to  chronic illness as noted above  - pt tolerating current diet   Consultants:  None Procedures/Studies:  None  Antibiotics:  None   Code Status: Full  Family Communication: Pt at bedside   Discharge Exam: Filed Vitals:   09/05/13 0952  BP: 115/56  Pulse:   Temp:   Resp:    Filed Vitals:   09/05/13 0432 09/05/13 0730 09/05/13 0949 09/05/13 0952  BP: 97/49 133/54 115/56 115/56  Pulse: 56 61 60   Temp: 98.1 F (36.7 C) 98.2 F (36.8 C)    TempSrc: Oral Oral    Resp: 16 12    Height:      Weight:      SpO2: 95% 100%      General: Pt is alert, follows commands appropriately, not in acute distress Cardiovascular: Regular rate and rhythm, S1/S2 +, no murmurs, no rubs, no gallops Respiratory: Clear to auscultation bilaterally, no wheezing, no crackles, no rhonchi Abdominal: Soft, non tender, non distended, bowel sounds +, no guarding    Discharge Instructions  Discharge Orders   Future Orders Complete By Expires   Diet - low sodium heart healthy  As directed    Increase activity slowly  As directed        Medication List         ALPRAZolam 1 MG tablet  Commonly known as:  XANAX  Take 1 tablet (1 mg total) by mouth 2 (two) times daily as needed for anxiety.     diclofenac sodium 1 % Gel  Commonly known as:  VOLTAREN  Apply 1 application topically daily as needed (back and leg pain).     folic  acid 1 MG tablet  Commonly known as:  FOLVITE  Take 1 tablet (1 mg total) by mouth daily.     HYDROcodone-acetaminophen 5-325 MG per tablet  Commonly known as:  NORCO/VICODIN  Take 1 tablet by mouth every 6 (six) hours as needed for moderate pain.     insulin aspart 100 UNIT/ML FlexPen  Commonly known as:  NOVOLOG  Inject 6 Units into the skin 3 (three) times daily as needed for high blood sugar.     lactulose 10 GM/15ML solution  Commonly known as:  CHRONULAC  Take 45 mLs (30 g total) by mouth 3 (three) times daily. Titrate to at least 3 BMs daily     LANTUS  SOLOSTAR 100 UNIT/ML Solostar Pen  Generic drug:  Insulin Glargine  Inject 45 Units into the skin at bedtime.     lisinopril 10 MG tablet  Commonly known as:  PRINIVIL,ZESTRIL  Take 1 tablet (10 mg total) by mouth daily.     pantoprazole 40 MG tablet  Commonly known as:  PROTONIX  Take 1 tablet (40 mg total) by mouth 2 (two) times daily.     propranolol 20 MG tablet  Commonly known as:  INDERAL  Take 1 tablet (20 mg total) by mouth 2 (two) times daily.     rifaximin 550 MG Tabs tablet  Commonly known as:  XIFAXAN  Take 550 mg by mouth 2 (two) times daily.     spironolactone 100 MG tablet  Commonly known as:  ALDACTONE  Take 1 tablet (100 mg total) by mouth daily.           Follow-up Information   Schedule an appointment as soon as possible for a visit with Barbette Merino, MD.   Specialty:  Internal Medicine   Contact information:   [address] [place] [address] [phone]       Follow up with Faye Ramsay, MD. (As needed, If symptoms worsen)    Specialty:  Internal Medicine   Contact information:   [address] [place] [address] [phone]        The results of significant diagnostics from this hospitalization (including imaging, microbiology, ancillary and laboratory) are listed below for reference.     Microbiology: No results found for this or any previous visit (from the past 240 hour(s)).   Labs: Basic Metabolic Panel:  Recent Labs Lab 09/01/13 0536 09/02/13 0340 09/03/13 0453 09/04/13 0415 09/05/13 0400  NA 137 136* 137 137 137  K 3.7 4.0 4.4 4.3 4.2  CL 106 104 103 104 104  CO2 19 21 24 21 22   GLUCOSE 76 94 100* 157* 127*  BUN 10 14 17 21 17   CREATININE 0.90 1.07 1.16 1.18 1.05  CALCIUM 9.1 8.8 9.1 9.0 8.9   Liver Function Tests:  Recent Labs Lab 08/30/13 0443 09/05/13 0400  AST 42* 55*  ALT 20 29  ALKPHOS 84 85  BILITOT 1.9* 1.3*  PROT 6.4 6.1  ALBUMIN 3.0* 2.8*    Recent Labs Lab 08/30/13 0443  08/31/13 0532 09/03/13 0700 09/04/13 0415 09/05/13 0400  AMMONIA 145* 63* 90* 148* 64*   CBC:  Recent Labs Lab 08/30/13 0443 09/01/13 0536 09/02/13 0340 09/05/13 0400  WBC 4.8 4.6 5.2 5.7  HGB 11.2* 11.4* 11.4* 11.0*  HCT 31.9* 32.5* 32.4* 31.7*  MCV 90.4 89.8 90.5 91.9  PLT 65* 53* 52* 58*   CBG:  Recent Labs Lab 09/04/13 0714 09/04/13 1146 09/04/13 1300 09/04/13 1705 09/04/13 2004  GLUCAP 224*  133* 128* 138* 192*   SIGNED: Time coordinating discharge: Over 30 minutes  Theodis Blaze, MD  [facility] 09/05/2013, 10:46 AM Pager [phone]  If 7PM-7AM, please contact night-coverage [email or web address] Password [email or web address]

## 2013-09-05 NOTE — Progress Notes (Signed)
Patient cleared for discharge. Packet copied and placed in [place]. ptar called for transportation. Patient's girlfriend is aware and agreeable.  [name] C. [place] MSW, [place]

## 2013-09-06 LAB — GLUCOSE, CAPILLARY: GLUCOSE-CAPILLARY: 184 mg/dL — AB (ref 70–99)

## 2013-09-12 ENCOUNTER — Inpatient Hospital Stay (HOSPITAL_COMMUNITY)
Admission: EM | Admit: 2013-09-12 | Discharge: 2013-09-15 | DRG: 637 | Discharge status: Against Medical Advice | Payer: Medicare PPO | Attending: Internal Medicine | Admitting: Internal Medicine

## 2013-09-12 ENCOUNTER — Emergency Department (HOSPITAL_COMMUNITY): Payer: Medicare PPO

## 2013-09-12 ENCOUNTER — Other Ambulatory Visit: Payer: Self-pay

## 2013-09-12 DIAGNOSIS — F22 Delusional disorders: Secondary | ICD-10-CM | POA: Diagnosis present

## 2013-09-12 DIAGNOSIS — Z79899 Other long term (current) drug therapy: Secondary | ICD-10-CM

## 2013-09-12 DIAGNOSIS — R339 Retention of urine, unspecified: Secondary | ICD-10-CM | POA: Diagnosis present

## 2013-09-12 DIAGNOSIS — F1020 Alcohol dependence, uncomplicated: Secondary | ICD-10-CM

## 2013-09-12 DIAGNOSIS — E875 Hyperkalemia: Secondary | ICD-10-CM

## 2013-09-12 DIAGNOSIS — E119 Type 2 diabetes mellitus without complications: Secondary | ICD-10-CM

## 2013-09-12 DIAGNOSIS — D61818 Other pancytopenia: Secondary | ICD-10-CM

## 2013-09-12 DIAGNOSIS — R4182 Altered mental status, unspecified: Secondary | ICD-10-CM

## 2013-09-12 DIAGNOSIS — F329 Major depressive disorder, single episode, unspecified: Secondary | ICD-10-CM

## 2013-09-12 DIAGNOSIS — K7460 Unspecified cirrhosis of liver: Secondary | ICD-10-CM

## 2013-09-12 DIAGNOSIS — Z720 Tobacco use: Secondary | ICD-10-CM

## 2013-09-12 DIAGNOSIS — E872 Acidosis, unspecified: Secondary | ICD-10-CM

## 2013-09-12 DIAGNOSIS — R338 Other retention of urine: Secondary | ICD-10-CM

## 2013-09-12 DIAGNOSIS — G9340 Encephalopathy, unspecified: Secondary | ICD-10-CM

## 2013-09-12 DIAGNOSIS — E162 Hypoglycemia, unspecified: Secondary | ICD-10-CM

## 2013-09-12 DIAGNOSIS — F32A Depression, unspecified: Secondary | ICD-10-CM

## 2013-09-12 DIAGNOSIS — F3130 Bipolar disorder, current episode depressed, mild or moderate severity, unspecified: Secondary | ICD-10-CM

## 2013-09-12 DIAGNOSIS — Z794 Long term (current) use of insulin: Secondary | ICD-10-CM

## 2013-09-12 DIAGNOSIS — F319 Bipolar disorder, unspecified: Secondary | ICD-10-CM | POA: Diagnosis present

## 2013-09-12 DIAGNOSIS — D696 Thrombocytopenia, unspecified: Secondary | ICD-10-CM

## 2013-09-12 DIAGNOSIS — Z8673 Personal history of transient ischemic attack (TIA), and cerebral infarction without residual deficits: Secondary | ICD-10-CM

## 2013-09-12 DIAGNOSIS — K219 Gastro-esophageal reflux disease without esophagitis: Secondary | ICD-10-CM | POA: Diagnosis present

## 2013-09-12 DIAGNOSIS — N179 Acute kidney failure, unspecified: Secondary | ICD-10-CM

## 2013-09-12 DIAGNOSIS — E1169 Type 2 diabetes mellitus with other specified complication: Principal | ICD-10-CM | POA: Diagnosis present

## 2013-09-12 DIAGNOSIS — I10 Essential (primary) hypertension: Secondary | ICD-10-CM

## 2013-09-12 DIAGNOSIS — F17200 Nicotine dependence, unspecified, uncomplicated: Secondary | ICD-10-CM | POA: Diagnosis present

## 2013-09-12 DIAGNOSIS — F1010 Alcohol abuse, uncomplicated: Secondary | ICD-10-CM

## 2013-09-12 DIAGNOSIS — K7030 Alcoholic cirrhosis of liver without ascites: Secondary | ICD-10-CM | POA: Diagnosis present

## 2013-09-12 DIAGNOSIS — E7220 Disorder of urea cycle metabolism, unspecified: Secondary | ICD-10-CM

## 2013-09-12 LAB — COMPREHENSIVE METABOLIC PANEL WITH GFR
ALT: 28 U/L (ref 0–53)
AST: 61 U/L — ABNORMAL HIGH (ref 0–37)
Albumin: 3 g/dL — ABNORMAL LOW (ref 3.5–5.2)
Alkaline Phosphatase: 81 U/L (ref 39–117)
BUN: 48 mg/dL — ABNORMAL HIGH (ref 6–23)
CO2: 17 meq/L — ABNORMAL LOW (ref 19–32)
Calcium: 9.4 mg/dL (ref 8.4–10.5)
Chloride: 109 meq/L (ref 96–112)
Creatinine, Ser: 1.72 mg/dL — ABNORMAL HIGH (ref 0.50–1.35)
GFR calc Af Amer: 49 mL/min — ABNORMAL LOW
GFR calc non Af Amer: 42 mL/min — ABNORMAL LOW
Glucose, Bld: 40 mg/dL — CL (ref 70–99)
Potassium: 4.5 meq/L (ref 3.7–5.3)
Sodium: 140 meq/L (ref 137–147)
Total Bilirubin: 1.6 mg/dL — ABNORMAL HIGH (ref 0.3–1.2)
Total Protein: 6.6 g/dL (ref 6.0–8.3)

## 2013-09-12 LAB — I-STAT VENOUS BLOOD GAS, ED
ACID-BASE DEFICIT: 5 mmol/L — AB (ref 0.0–2.0)
BICARBONATE: 18.7 meq/L — AB (ref 20.0–24.0)
O2 SAT: 94 %
TCO2: 20 mmol/L (ref 0–100)
pCO2, Ven: 29.1 mmHg — ABNORMAL LOW (ref 45.0–50.0)
pH, Ven: 7.416 — ABNORMAL HIGH (ref 7.250–7.300)
pO2, Ven: 70 mmHg — ABNORMAL HIGH (ref 30.0–45.0)

## 2013-09-12 LAB — AMMONIA: Ammonia: 55 umol/L (ref 11–60)

## 2013-09-12 LAB — CBC
HCT: 31.2 % — ABNORMAL LOW (ref 39.0–52.0)
Hemoglobin: 11.1 g/dL — ABNORMAL LOW (ref 13.0–17.0)
MCH: 32.5 pg (ref 26.0–34.0)
MCHC: 35.6 g/dL (ref 30.0–36.0)
MCV: 91.2 fL (ref 78.0–100.0)
Platelets: 68 10*3/uL — ABNORMAL LOW (ref 150–400)
RBC: 3.42 MIL/uL — ABNORMAL LOW (ref 4.22–5.81)
RDW: 17.1 % — ABNORMAL HIGH (ref 11.5–15.5)
WBC: 9.2 10*3/uL (ref 4.0–10.5)

## 2013-09-12 LAB — I-STAT CG4 LACTIC ACID, ED: Lactic Acid, Venous: 1.46 mmol/L (ref 0.5–2.2)

## 2013-09-12 LAB — I-STAT TROPONIN, ED: Troponin i, poc: 0.01 ng/mL (ref 0.00–0.08)

## 2013-09-12 LAB — CBG MONITORING, ED: Glucose-Capillary: 218 mg/dL — ABNORMAL HIGH (ref 70–99)

## 2013-09-12 MED ORDER — DEXTROSE-NACL 5-0.9 % IV SOLN
INTRAVENOUS | Status: DC
Start: 2013-09-12 — End: 2013-09-13

## 2013-09-12 NOTE — ED Notes (Signed)
The pt is very confused.  No complaints  Of pain

## 2013-09-12 NOTE — ED Notes (Signed)
The pt  Is sleeping.  While he sleeps his sats drop in the upper 80s. Nasal 02 started at 2 liters

## 2013-09-12 NOTE — ED Notes (Signed)
The pt brought back from c-t no response to them while he was over there.  Mu,bling with painful stimuli.  Dr Mingo Amber ordered d10% per iv.  started

## 2013-09-12 NOTE — ED Notes (Signed)
To ED from home via EMS, pt altered per family since AM, CBG 63 per EMS, gave 1 amp D50, CBG 150 - 130, VSS, normally A/O, 18G left AC, per EMS, pt not able to ambulate, pt confused and lethargic on arrival, no focal neuro deficits per EMS

## 2013-09-12 NOTE — ED Notes (Signed)
The pt returned from c-t.  Pt difficult to arouse d10 only tko

## 2013-09-12 NOTE — ED Notes (Signed)
To x-ray

## 2013-09-12 NOTE — ED Notes (Signed)
The pt has no pain  No discomfort.  No family has shown up yet.  Still drowsy responding to questions asked

## 2013-09-12 NOTE — ED Provider Notes (Signed)
CSN: [id number]     Arrival date & time 09/12/13  1918 History   First MD Initiated Contact with Patient 09/12/13 1918     Chief Complaint  Patient presents with  . Altered Mental Status     (Consider location/radiation/quality/duration/timing/severity/associated sxs/prior Treatment) HPI  This is a 58 y.o. male brought to ED via EMS from home due to [place]. Hx limited due to pt being non-verbal beyond "yes" and "no" Per triage note from EMS, wife states he has been having a stead decline in mental status and taking care of himself. Pt has not other complaints. Family is no present in ED for further information. Per medical records, pt has hx of DM, bipolar affect, HTN, stroke (~16yrs ago), cirrhosis, alcohol abuse, chronic pain, cocaine abuse, hepatic encephalopathy, COPD, anemia and Barrett's esophagus.    Past Medical History  Diagnosis Date  . Neuropathy   . Diabetes mellitus   . Bipolar affect, depressed   . Hypertension   . Arthritis   . Stroke     Mini stroke about 34yrs ago  . Cirrhosis   . Alcohol abuse   . Chronic pain   . Cocaine abuse   . Muscle spasm     both legs  . Encephalopathy, hepatic   . Detached retina   . COPD (chronic obstructive pulmonary disease)     emphysema  . Bronchitis   . Barrett's esophagus   . GERD (gastroesophageal reflux disease)     has ulcer  . Anemia    Past Surgical History  Procedure Laterality Date  . Fracture surgery      Leg and arm 51yrs ago  . Esophagogastroduodenoscopy  04/04/2012    Procedure: ESOPHAGOGASTRODUODENOSCOPY (EGD);  Surgeon: Irene Shipper, MD;  Location: [facility] ENDOSCOPY;  Service: Endoscopy;  Laterality: N/A;  . Esophagogastroduodenoscopy Left 03/13/2013    Procedure: ESOPHAGOGASTRODUODENOSCOPY (EGD);  Surgeon: Arta Silence, MD;  Location: [facility] ENDOSCOPY;  Service: Endoscopy;  Laterality: Left;  Marland Kitchen Eye surgery  8 months ago both eyes    cataracts both eyes, detached eye, gas pocket  . Vasectomy    . Pars plana  vitrectomy Left 07/08/2013    Procedure: PARS PLANA VITRECTOMY WITH 25 GAUGE;  Surgeon: Hurman Horn, MD;  Location: [place];  Service: Ophthalmology;  Laterality: Left;  . Intraocular lens removal Left 07/08/2013    Procedure: REMOVAL OF INTRAOCULAR LENS;  Surgeon: Hurman Horn, MD;  Location: [place];  Service: Ophthalmology;  Laterality: Left;  . Placement and suture of secondary intraocular lens Left 07/08/2013    Procedure: PLACEMENT AND SUTURE OF SECONDARY INTRAOCULAR LENS;  Surgeon: Hurman Horn, MD;  Location: [place];  Service: Ophthalmology;  Laterality: Left;  Insertion of Anterior Capsule Intraocular Lens    Family History  Problem Relation Age of Onset  . Hypotension Mother    History  Substance Use Topics  . Smoking status: Current Every Day Smoker -- 1.00 packs/day for 30 years    Types: Cigarettes    Last Attempt to Quit: 04/06/2012  . Smokeless tobacco: Never Used     Comment: quit   . Alcohol Use: 0.0 oz/week     Comment: 12 pk beer daily  06/2013 - no alcohol since 11/2012    Review of Systems  Unable to perform ROS: Mental status change      Allergies  Review of patient's allergies indicates no known allergies.  Home Medications   Prior to Admission medications   Medication Sig Start Date  End Date Taking? Authorizing Provider  ALPRAZolam Duanne Moron) 1 MG tablet Take 1 tablet (1 mg total) by mouth 2 (two) times daily as needed for anxiety. 09/05/13   Theodis Blaze, MD  diclofenac sodium (VOLTAREN) 1 % GEL Apply 1 application topically daily as needed (back and leg pain).    Historical Provider, MD  folic acid (FOLVITE) 1 MG tablet Take 1 tablet (1 mg total) by mouth daily. 04/12/13   Janece Canterbury, MD  HYDROcodone-acetaminophen (NORCO/VICODIN) 5-325 MG per tablet Take 1 tablet by mouth every 6 (six) hours as needed for moderate pain. 09/05/13   Theodis Blaze, MD  insulin aspart (NOVOLOG) 100 UNIT/ML FlexPen Inject 6 Units into the skin 3 (three) times daily as needed for  high blood sugar.    Historical Provider, MD  Insulin Glargine (LANTUS SOLOSTAR) 100 UNIT/ML SOPN Inject 45 Units into the skin at bedtime.     Historical Provider, MD  lactulose (CHRONULAC) 10 GM/15ML solution Take 45 mLs (30 g total) by mouth 3 (three) times daily. Titrate to at least 3 BMs daily 09/05/13   Theodis Blaze, MD  lisinopril (PRINIVIL,ZESTRIL) 10 MG tablet Take 1 tablet (10 mg total) by mouth daily. 09/05/13   Theodis Blaze, MD  pantoprazole (PROTONIX) 40 MG tablet Take 1 tablet (40 mg total) by mouth 2 (two) times daily. 03/14/13   Bonnielee Haff, MD  propranolol (INDERAL) 20 MG tablet Take 1 tablet (20 mg total) by mouth 2 (two) times daily. 04/12/13   Janece Canterbury, MD  rifaximin (XIFAXAN) 550 MG TABS tablet Take 550 mg by mouth 2 (two) times daily.    Historical Provider, MD  spironolactone (ALDACTONE) 100 MG tablet Take 1 tablet (100 mg total) by mouth daily. 04/02/13   Nishant Dhungel, MD   BP 113/51  Pulse 99  Temp(Src) 98.8 F (37.1 C) (Oral)  Resp 13  SpO2 99% Physical Exam  Constitutional: He appears well-developed and well-nourished. No distress.  HENT:  Head: Normocephalic and atraumatic.  Mouth/Throat: No oropharyngeal exudate.  Eyes: Conjunctivae are normal. Pupils are equal, round, and reactive to light. No scleral icterus.  Neck: Normal range of motion. No tracheal deviation present. No thyromegaly present.  Cardiovascular: Normal rate, regular rhythm and normal heart sounds.  Exam reveals no gallop and no friction rub.   No murmur heard. Pulmonary/Chest: Effort normal and breath sounds normal. No stridor. No respiratory distress. He has no wheezes. He has no rales. He exhibits no tenderness.  Abdominal: Soft. He exhibits no distension and no mass. There is no tenderness. There is no rebound and no guarding.  Musculoskeletal: Normal range of motion. He exhibits no edema.  Neurological: No cranial nerve deficit or sensory deficit. GCS eye subscore is 2. GCS  verbal subscore is 4. GCS motor subscore is 6.  Reflex Scores:      Patellar reflexes are 2+ on the right side and 2+ on the left side. Pt is moving all 4 extremities but moves LUE less  Skin: Skin is warm and dry. He is not diaphoretic.    ED Course  Procedures (including critical care time) Labs Review Labs Reviewed  CBC  COMPREHENSIVE METABOLIC PANEL  AMMONIA  URINALYSIS, ROUTINE W REFLEX MICROSCOPIC  BLOOD GAS, VENOUS  I-STAT CG4 LACTIC ACID, ED  Randolm Idol, ED    Imaging Review Dg Chest 2 View  09/12/2013   CLINICAL DATA:  Diabetes, hypertension  EXAM: CHEST  2 VIEW  COMPARISON:  08/28/2013  FINDINGS: Mild cardiomegaly  with central vascular congestion. No focal pneumonia, collapse or consolidation. No edema, effusion or pneumothorax. Trachea is midline. Lateral view is limited because of overlying soft tissue shadows.  IMPRESSION: Cardiomegaly with mild vascular congestion. No definite acute process.   Electronically Signed   By: Daryll Brod M.D.   On: 09/12/2013 21:41   Ct Head Wo Contrast  09/12/2013   CLINICAL DATA:  Altered mental status with confusion and lethargy. No focal neurological defects.  EXAM: CT HEAD WITHOUT CONTRAST  TECHNIQUE: Contiguous axial images were obtained from the base of the skull through the vertex without intravenous contrast.  COMPARISON:  CT HEAD W/O CM dated 08/28/2013  FINDINGS: Mild diffuse cerebral atrophy. No ventricular dilatation. No mass effect or midline shift. No abnormal extra-axial fluid collections. Gray-white matter junctions are distinct. Basal cisterns are not effaced. No evidence of acute intracranial hemorrhage. No depressed skull fractures. Visualized paranasal sinuses and mastoid air cells are not opacified.  IMPRESSION: No acute intracranial abnormalities.  Mild atrophy.   Electronically Signed   By: Lucienne Capers M.D.   On: 09/12/2013 23:43     EKG Interpretation   Date/Time:  Thursday September 12 2013 20:08:54  EDT Ventricular Rate:  66 PR Interval:  160 QRS Duration: 93 QT Interval:  440 QTC Calculation: 461 R Axis:   14 Text Interpretation:  Sinus rhythm Similar to prior Confirmed by Mingo Amber   MD, [place] ([id number]) on 09/12/2013 8:11:54 PM      MDM   Final diagnoses:  None    This is a 58 y.o. male brought to ED via EMS from home due to [place]. Hx limited due to pt being non-verbal beyond "yes" and "no" Per triage note from EMS, wife states he has been having a stead decline in mental status and taking care of himself. Pt has not other complaints. Family is no present in ED for further information. Per medical records, pt has hx of DM, bipolar affect, HTN, stroke (~74yrs ago), cirrhosis, alcohol abuse, chronic pain, cocaine abuse, hepatic encephalopathy, COPD, anemia and Barrett's esophagus.   Vital signs are within normal limits. Patient has a GCS of 11. He is maintaining his airway well. Breath sounds are normal. Cardiovascular exam is within normal limits, with 2+ pulses in all 4 extremities.  Patient is moving the left upper extremity less than other extremities, but strength is difficult to perform due to small amount of participation from the patient.  There is no evidence of traumatic injury on my examination.  At this time, this likely represents hepatic encephalopathy, as the patient is present multiple times due to this in the past, altered, and a similar fashion is now.  However, I am still considering stroke, uremia, infection, acidosis, ICH, cardiac etiology. Will followup EKG, labs, CT of the head. Patient stable at this time. We'll continue to monitor closely.  Ammonia is within normal limits. Blood gases not reveal values of explain altered mental status. Patient is no increase in lactic acid. He is afebrile. I doubt ACS, infection, acidosis, ICH at this time. However, patient does have increase in his BUN, AKI.  I believe that uremia is the most likely examination of his altered mental  status at this time. However, only further workup is indicated at this time to rule out further etiologies.  At this time, we'll admit the patient for AKI, altered mental status. Patient remains stable at this time.  Patient is being admitted in stable condition.  I have discussed case and care  has been guided by my attending physician, Dr. Mingo Amber.  Doy Hutching, MD 09/13/13 [phone]

## 2013-09-12 NOTE — ED Notes (Signed)
Pt back to ct

## 2013-09-13 ENCOUNTER — Encounter (HOSPITAL_COMMUNITY): Payer: Self-pay | Admitting: *Deleted

## 2013-09-13 ENCOUNTER — Inpatient Hospital Stay (HOSPITAL_COMMUNITY): Payer: Medicare PPO

## 2013-09-13 DIAGNOSIS — N179 Acute kidney failure, unspecified: Secondary | ICD-10-CM | POA: Diagnosis present

## 2013-09-13 DIAGNOSIS — G9340 Encephalopathy, unspecified: Secondary | ICD-10-CM

## 2013-09-13 DIAGNOSIS — F329 Major depressive disorder, single episode, unspecified: Secondary | ICD-10-CM

## 2013-09-13 DIAGNOSIS — E162 Hypoglycemia, unspecified: Secondary | ICD-10-CM | POA: Diagnosis present

## 2013-09-13 DIAGNOSIS — K7460 Unspecified cirrhosis of liver: Secondary | ICD-10-CM

## 2013-09-13 DIAGNOSIS — F1020 Alcohol dependence, uncomplicated: Secondary | ICD-10-CM

## 2013-09-13 DIAGNOSIS — E872 Acidosis, unspecified: Secondary | ICD-10-CM | POA: Diagnosis present

## 2013-09-13 DIAGNOSIS — D61818 Other pancytopenia: Secondary | ICD-10-CM

## 2013-09-13 DIAGNOSIS — F17200 Nicotine dependence, unspecified, uncomplicated: Secondary | ICD-10-CM

## 2013-09-13 DIAGNOSIS — F3130 Bipolar disorder, current episode depressed, mild or moderate severity, unspecified: Secondary | ICD-10-CM

## 2013-09-13 DIAGNOSIS — F1010 Alcohol abuse, uncomplicated: Secondary | ICD-10-CM

## 2013-09-13 DIAGNOSIS — I10 Essential (primary) hypertension: Secondary | ICD-10-CM

## 2013-09-13 DIAGNOSIS — E119 Type 2 diabetes mellitus without complications: Secondary | ICD-10-CM

## 2013-09-13 DIAGNOSIS — F3289 Other specified depressive episodes: Secondary | ICD-10-CM

## 2013-09-13 DIAGNOSIS — R4182 Altered mental status, unspecified: Secondary | ICD-10-CM | POA: Diagnosis present

## 2013-09-13 DIAGNOSIS — D696 Thrombocytopenia, unspecified: Secondary | ICD-10-CM

## 2013-09-13 LAB — GLUCOSE, CAPILLARY
GLUCOSE-CAPILLARY: 113 mg/dL — AB (ref 70–99)
GLUCOSE-CAPILLARY: 154 mg/dL — AB (ref 70–99)
GLUCOSE-CAPILLARY: 87 mg/dL (ref 70–99)
Glucose-Capillary: 165 mg/dL — ABNORMAL HIGH (ref 70–99)
Glucose-Capillary: 127 mg/dL — ABNORMAL HIGH (ref 70–99)
Glucose-Capillary: 135 mg/dL — ABNORMAL HIGH (ref 70–99)
Glucose-Capillary: 123 mg/dL — ABNORMAL HIGH (ref 70–99)
Glucose-Capillary: 116 mg/dL — ABNORMAL HIGH (ref 70–99)
Glucose-Capillary: 154 mg/dL — ABNORMAL HIGH (ref 70–99)

## 2013-09-13 LAB — BASIC METABOLIC PANEL
BUN: 46 mg/dL — ABNORMAL HIGH (ref 6–23)
CALCIUM: 8.6 mg/dL (ref 8.4–10.5)
CO2: 17 meq/L — AB (ref 19–32)
Chloride: 107 mEq/L (ref 96–112)
Creatinine, Ser: 1.54 mg/dL — ABNORMAL HIGH (ref 0.50–1.35)
GFR calc Af Amer: 56 mL/min — ABNORMAL LOW (ref >90)
GFR, EST NON AFRICAN AMERICAN: 48 mL/min — AB (ref >90)
Glucose, Bld: 139 mg/dL — ABNORMAL HIGH (ref 70–99)
Potassium: 4.5 mEq/L (ref 3.7–5.3)
Sodium: 139 mEq/L (ref 137–147)

## 2013-09-13 LAB — LIPID PANEL
CHOL/HDL RATIO: 3.2 ratio
Cholesterol: 142 mg/dL (ref 0–200)
HDL: 45 mg/dL (ref >39)
LDL CALC: 82 mg/dL (ref 0–99)
Triglycerides: 75 mg/dL (ref <150)
VLDL: 15 mg/dL (ref 0–40)

## 2013-09-13 LAB — CBC
HCT: 30.5 % — ABNORMAL LOW (ref 39.0–52.0)
HEMOGLOBIN: 10.8 g/dL — AB (ref 13.0–17.0)
MCH: 32.5 pg (ref 26.0–34.0)
MCHC: 35.4 g/dL (ref 30.0–36.0)
MCV: 91.9 fL (ref 78.0–100.0)
Platelets: 43 10*3/uL — ABNORMAL LOW (ref 150–400)
RBC: 3.32 MIL/uL — ABNORMAL LOW (ref 4.22–5.81)
RDW: 17 % — ABNORMAL HIGH (ref 11.5–15.5)
WBC: 5 10*3/uL (ref 4.0–10.5)

## 2013-09-13 LAB — RAPID URINE DRUG SCREEN, HOSP PERFORMED
Amphetamines: NOT DETECTED
Barbiturates: NOT DETECTED
Benzodiazepines: POSITIVE — AB
Cocaine: NOT DETECTED
Opiates: POSITIVE — AB
Tetrahydrocannabinol: NOT DETECTED

## 2013-09-13 LAB — COMPREHENSIVE METABOLIC PANEL
ALT: 29 U/L (ref 0–53)
AST: 62 U/L — ABNORMAL HIGH (ref 0–37)
Albumin: 3 g/dL — ABNORMAL LOW (ref 3.5–5.2)
Alkaline Phosphatase: 86 U/L (ref 39–117)
BUN: 50 mg/dL — AB (ref 6–23)
CALCIUM: 8.9 mg/dL (ref 8.4–10.5)
CO2: 18 meq/L — AB (ref 19–32)
Chloride: 108 mEq/L (ref 96–112)
Creatinine, Ser: 1.71 mg/dL — ABNORMAL HIGH (ref 0.50–1.35)
GFR calc non Af Amer: 43 mL/min — ABNORMAL LOW (ref >90)
GFR, EST AFRICAN AMERICAN: 49 mL/min — AB (ref >90)
GLUCOSE: 115 mg/dL — AB (ref 70–99)
Potassium: 5.6 mEq/L — ABNORMAL HIGH (ref 3.7–5.3)
SODIUM: 139 meq/L (ref 137–147)
Total Bilirubin: 1.8 mg/dL — ABNORMAL HIGH (ref 0.3–1.2)
Total Protein: 6.4 g/dL (ref 6.0–8.3)

## 2013-09-13 LAB — PROTIME-INR
INR: 1.34 (ref 0.00–1.49)
PROTHROMBIN TIME: 16.3 s — AB (ref 11.6–15.2)

## 2013-09-13 LAB — URINALYSIS, ROUTINE W REFLEX MICROSCOPIC
Bilirubin Urine: NEGATIVE
Glucose, UA: NEGATIVE mg/dL
KETONES UR: 15 mg/dL — AB
LEUKOCYTES UA: NEGATIVE
NITRITE: NEGATIVE
Protein, ur: NEGATIVE mg/dL
Specific Gravity, Urine: 1.026 (ref 1.005–1.030)
Urobilinogen, UA: 0.2 mg/dL (ref 0.0–1.0)
pH: 5 (ref 5.0–8.0)

## 2013-09-13 LAB — ETHANOL: Alcohol, Ethyl (B): <11 mg/dL (ref 0–11)

## 2013-09-13 LAB — URINE MICROSCOPIC-ADD ON

## 2013-09-13 LAB — HEMOGLOBIN A1C
HEMOGLOBIN A1C: 7.5 % — AB (ref <5.7)
Mean Plasma Glucose: 169 mg/dL — ABNORMAL HIGH (ref <117)

## 2013-09-13 LAB — AMMONIA: Ammonia: 80 umol/L — ABNORMAL HIGH (ref 11–60)

## 2013-09-13 LAB — MAGNESIUM: MAGNESIUM: 1.8 mg/dL (ref 1.5–2.5)

## 2013-09-13 LAB — MRSA PCR SCREENING: MRSA by PCR: NEGATIVE

## 2013-09-13 MED ORDER — TRAMADOL HCL 50 MG PO TABS
50.0000 mg | ORAL_TABLET | Freq: Once | ORAL | Status: AC
  Administered 2013-09-13: 50 mg via ORAL
  Filled 2013-09-13: qty 1

## 2013-09-13 MED ORDER — LACTULOSE 10 GM/15ML PO SOLN
30.0000 g | Freq: Three times a day (TID) | ORAL | Status: DC
  Administered 2013-09-13 – 2013-09-15 (×8): 30 g via ORAL
  Filled 2013-09-13 (×10): qty 45

## 2013-09-13 MED ORDER — DEXTROSE 10 % IV SOLN: INTRAVENOUS | Status: AC

## 2013-09-13 MED ORDER — RIFAXIMIN 550 MG PO TABS
550.0000 mg | ORAL_TABLET | Freq: Two times a day (BID) | ORAL | Status: DC
  Administered 2013-09-13 – 2013-09-15 (×5): 550 mg via ORAL
  Filled 2013-09-13 (×7): qty 1

## 2013-09-13 MED ORDER — FOLIC ACID 1 MG PO TABS
1.0000 mg | ORAL_TABLET | Freq: Every day | ORAL | Status: DC
  Administered 2013-09-14 – 2013-09-15 (×2): 1 mg via ORAL
  Filled 2013-09-13 (×3): qty 1

## 2013-09-13 MED ORDER — DEXTROSE 5 % IV SOLN
INTRAVENOUS | Status: DC
  Administered 2013-09-13 – 2013-09-14 (×2): via INTRAVENOUS
  Filled 2013-09-13 (×8): qty 150

## 2013-09-13 MED ORDER — INSULIN ASPART 100 UNIT/ML ~~LOC~~ SOLN
0.0000 [IU] | SUBCUTANEOUS | Status: DC
  Administered 2013-09-13 – 2013-09-14 (×5): 3 [IU] via SUBCUTANEOUS
  Administered 2013-09-15: 2 [IU] via SUBCUTANEOUS
  Administered 2013-09-15: 3 [IU] via SUBCUTANEOUS

## 2013-09-13 MED ORDER — DEXTROSE 10 % IV SOLN: INTRAVENOUS | Status: DC

## 2013-09-13 MED ORDER — SODIUM CHLORIDE 0.9 % IV SOLN
INTRAVENOUS | Status: DC
Start: 2013-09-13 — End: 2013-09-15
  Administered 2013-09-13: 50 mL/h via INTRAVENOUS
  Administered 2013-09-15: 13:00:00 via INTRAVENOUS

## 2013-09-13 MED ORDER — PROPRANOLOL HCL 20 MG PO TABS
20.0000 mg | ORAL_TABLET | Freq: Two times a day (BID) | ORAL | Status: DC
  Administered 2013-09-14 – 2013-09-15 (×3): 20 mg via ORAL
  Filled 2013-09-13 (×5): qty 1

## 2013-09-13 MED ORDER — DEXTROSE 50 % IV SOLN: 25.0000 mL | INTRAVENOUS | Status: DC | PRN

## 2013-09-13 MED ORDER — SODIUM CHLORIDE 0.9 % IJ SOLN
3.0000 mL | Freq: Two times a day (BID) | INTRAMUSCULAR | Status: DC
  Administered 2013-09-13 – 2013-09-15 (×5): 3 mL via INTRAVENOUS

## 2013-09-13 MED ORDER — SPIRONOLACTONE 50 MG PO TABS
50.0000 mg | ORAL_TABLET | Freq: Every day | ORAL | Status: DC
  Filled 2013-09-13: qty 1

## 2013-09-13 MED ORDER — ONDANSETRON HCL 4 MG PO TABS: 4.0000 mg | ORAL_TABLET | Freq: Four times a day (QID) | ORAL | Status: DC | PRN

## 2013-09-13 MED ORDER — HEPARIN SODIUM (PORCINE) 5000 UNIT/ML IJ SOLN
5000.0000 [IU] | Freq: Three times a day (TID) | INTRAMUSCULAR | Status: DC
  Administered 2013-09-13 (×2): 5000 [IU] via SUBCUTANEOUS
  Filled 2013-09-13 (×4): qty 1

## 2013-09-13 MED ORDER — ONDANSETRON HCL 4 MG/2ML IJ SOLN: 4.0000 mg | Freq: Four times a day (QID) | INTRAMUSCULAR | Status: DC | PRN

## 2013-09-13 NOTE — H&P (Signed)
[facility] History and Physical  Patient: [name]  TIR:[id number]  DOB: April 21, 1956  DOS: the patient was seen and examined on 09/13/2013 PCP: Barbette Merino, MD  Chief Complaint: Altered mental status  HPI: [name] is a 58 y.o. male with Past medical history of cirrhosis of liver on transplant list, diabetes mellitus, hypertension, CVA, chronic pancytopenia, GERD. The patient was brought in by EMS as his mental status has changed. The history was obtained from ED documentation, the patient was felt to be poor historian. As per the ED patient recently moved with his brother after his recent hospitalization. This morning patient's brother found that the patient has been acting confused, has been more sleepy and drowsy less responsive and lethargic.  The patient is coming from home. And at his baseline independent for most of his ADL.  Review of Systems: as mentioned in the history of present illness.  A Comprehensive review of the other systems is negative.  Past Medical History  Diagnosis Date  . Neuropathy   . Diabetes mellitus   . Bipolar affect, depressed   . Hypertension   . Arthritis   . Stroke     Mini stroke about 99yrs ago  . Cirrhosis   . Alcohol abuse   . Chronic pain   . Cocaine abuse   . Muscle spasm     both legs  . Encephalopathy, hepatic   . Detached retina   . COPD (chronic obstructive pulmonary disease)     emphysema  . Bronchitis   . Barrett's esophagus   . GERD (gastroesophageal reflux disease)     has ulcer  . Anemia    Past Surgical History  Procedure Laterality Date  . Fracture surgery      Leg and arm 20yrs ago  . Esophagogastroduodenoscopy  04/04/2012    Procedure: ESOPHAGOGASTRODUODENOSCOPY (EGD);  Surgeon: Irene Shipper, MD;  Location: [facility] ENDOSCOPY;  Service: Endoscopy;  Laterality: N/A;  . Esophagogastroduodenoscopy Left 03/13/2013    Procedure: ESOPHAGOGASTRODUODENOSCOPY (EGD);  Surgeon: Arta Silence, MD;  Location: [facility]  ENDOSCOPY;  Service: Endoscopy;  Laterality: Left;  Marland Kitchen Eye surgery  8 months ago both eyes    cataracts both eyes, detached eye, gas pocket  . Vasectomy    . Pars plana vitrectomy Left 07/08/2013    Procedure: PARS PLANA VITRECTOMY WITH 25 GAUGE;  Surgeon: Hurman Horn, MD;  Location: [place];  Service: Ophthalmology;  Laterality: Left;  . Intraocular lens removal Left 07/08/2013    Procedure: REMOVAL OF INTRAOCULAR LENS;  Surgeon: Hurman Horn, MD;  Location: [place];  Service: Ophthalmology;  Laterality: Left;  . Placement and suture of secondary intraocular lens Left 07/08/2013    Procedure: PLACEMENT AND SUTURE OF SECONDARY INTRAOCULAR LENS;  Surgeon: Hurman Horn, MD;  Location: [place];  Service: Ophthalmology;  Laterality: Left;  Insertion of Anterior Capsule Intraocular Lens    Social History:  reports that he has been smoking Cigarettes.  He has a 30 pack-year smoking history. He has never used smokeless tobacco. He reports that he drinks alcohol. He reports that he uses illicit drugs (Cocaine).  No Known Allergies  Family History  Problem Relation Age of Onset  . Hypotension Mother     Prior to Admission medications   Medication Sig Start Date End Date Taking? Authorizing Provider  ALPRAZolam Duanne Moron) 1 MG tablet Take 1 tablet (1 mg total) by mouth 2 (two) times daily as needed for anxiety. 09/05/13   Theodis Blaze, MD  diclofenac sodium (VOLTAREN) 1 % GEL Apply 1 application topically daily as needed (back and leg pain).    Historical Provider, MD  folic acid (FOLVITE) 1 MG tablet Take 1 tablet (1 mg total) by mouth daily. 04/12/13   Janece Canterbury, MD  HYDROcodone-acetaminophen (NORCO/VICODIN) 5-325 MG per tablet Take 1 tablet by mouth every 6 (six) hours as needed for moderate pain. 09/05/13   Theodis Blaze, MD  insulin aspart (NOVOLOG) 100 UNIT/ML FlexPen Inject 6 Units into the skin 3 (three) times daily as needed for high blood sugar.    Historical Provider, MD  Insulin Glargine  (LANTUS SOLOSTAR) 100 UNIT/ML SOPN Inject 45 Units into the skin at bedtime.     Historical Provider, MD  lactulose (CHRONULAC) 10 GM/15ML solution Take 45 mLs (30 g total) by mouth 3 (three) times daily. Titrate to at least 3 BMs daily 09/05/13   Theodis Blaze, MD  lisinopril (PRINIVIL,ZESTRIL) 10 MG tablet Take 1 tablet (10 mg total) by mouth daily. 09/05/13   Theodis Blaze, MD  pantoprazole (PROTONIX) 40 MG tablet Take 1 tablet (40 mg total) by mouth 2 (two) times daily. 03/14/13   Bonnielee Haff, MD  propranolol (INDERAL) 20 MG tablet Take 1 tablet (20 mg total) by mouth 2 (two) times daily. 04/12/13   Janece Canterbury, MD  rifaximin (XIFAXAN) 550 MG TABS tablet Take 550 mg by mouth 2 (two) times daily.    Historical Provider, MD  spironolactone (ALDACTONE) 100 MG tablet Take 1 tablet (100 mg total) by mouth daily. 04/02/13   Louellen Molder, MD    Physical Exam: Filed Vitals:   09/12/13 2114 09/12/13 2218 09/12/13 2332 09/13/13 0021  BP: 120/52 112/49 107/51 134/112  Pulse: 58 55 48 58  Temp:      TempSrc:      Resp:  16 18 16   SpO2: 90% 95% 100% 100%    General: drowsy but arousable and Oriented to Place and Person. Appear in moderate distress, follows command Eyes: PERRL ENT: Oral Mucosa clear moist. Neck: No JVD Cardiovascular: S1 and S2 Present, no Murmur, Peripheral Pulses Present Respiratory: Bilateral Air entry equal and Decreased, Clear to Auscultation,  No Crackles, faint occasional wheezes Abdomen: Bowel Sound Present, Soft and Non tender Skin: No Rash Extremities: No Pedal edema, no calf tenderness Neurologic: Patient preferring right upper extremity 4 movement. Left lower extremity patient moving spontaneously appropriately both.  Labs on Admission:  CBC:  Recent Labs Lab 09/12/13 1921  WBC 9.2  HGB 11.1*  HCT 31.2*  MCV 91.2  PLT 68*    CMP     Component Value Date/Time   NA 140 09/12/2013 1921   K 4.5 09/12/2013 1921   CL 109 09/12/2013 1921   CO2 17*  09/12/2013 1921   GLUCOSE 40* 09/12/2013 1921   BUN 48* 09/12/2013 1921   CREATININE 1.72* 09/12/2013 1921   CREATININE 0.62 04/16/2012 2015   CALCIUM 9.4 09/12/2013 1921   PROT 6.6 09/12/2013 1921   ALBUMIN 3.0* 09/12/2013 1921   AST 61* 09/12/2013 1921   ALT 28 09/12/2013 1921   ALKPHOS 81 09/12/2013 1921   BILITOT 1.6* 09/12/2013 1921   GFRNONAA 42* 09/12/2013 1921   GFRAA 49* 09/12/2013 1921    No results found for this basename: LIPASE, AMYLASE,  in the last 168 hours  Recent Labs Lab 09/12/13 2309  AMMONIA 55    No results found for this basename: CKTOTAL, CKMB, CKMBINDEX, TROPONINI,  in the last 168 hours BNP (  last 3 results) No results found for this basename: PROBNP,  in the last 8760 hours  Radiological Exams on Admission: Dg Chest 2 View  09/12/2013   CLINICAL DATA:  Diabetes, hypertension  EXAM: CHEST  2 VIEW  COMPARISON:  08/28/2013  FINDINGS: Mild cardiomegaly with central vascular congestion. No focal pneumonia, collapse or consolidation. No edema, effusion or pneumothorax. Trachea is midline. Lateral view is limited because of overlying soft tissue shadows.  IMPRESSION: Cardiomegaly with mild vascular congestion. No definite acute process.   Electronically Signed   By: Daryll Brod M.D.   On: 09/12/2013 21:41   Ct Head Wo Contrast  09/12/2013   CLINICAL DATA:  Altered mental status with confusion and lethargy. No focal neurological defects.  EXAM: CT HEAD WITHOUT CONTRAST  TECHNIQUE: Contiguous axial images were obtained from the base of the skull through the vertex without intravenous contrast.  COMPARISON:  CT HEAD W/O CM dated 08/28/2013  FINDINGS: Mild diffuse cerebral atrophy. No ventricular dilatation. No mass effect or midline shift. No abnormal extra-axial fluid collections. Gray-white matter junctions are distinct. Basal cisterns are not effaced. No evidence of acute intracranial hemorrhage. No depressed skull fractures. Visualized paranasal sinuses and mastoid air cells  are not opacified.  IMPRESSION: No acute intracranial abnormalities.  Mild atrophy.   Electronically Signed   By: Lucienne Capers M.D.   On: 09/12/2013 23:43    EKG: Independently reviewed. unchanged from previous tracings, normal sinus rhythm, nonspecific ST and T waves changes.  Assessment/Plan Principal Problem:   Hypoglycemic encephalopathy Active Problems:   HTN (hypertension)   Diabetes mellitus   Thrombocytopenia   Depression   Cirrhosis   Altered mental status   Metabolic acidosis   Acute kidney injury   1. Hypoglycemic encephalopathy The patient is presenting with complaints of generalized weakness and fatigue and unresponsiveness. His CBg was a 76 with EMS and 50 here. With acute kidney injury most likely patient has accumulation of his long acting insulin which has led to hypoglycemia. The patient's mentation significantly improved after giving him glucose. Initially he was only on sitting yes or no later on he was more conversant and he was able to get a on longer conversations with me. With this the patient will be admitted to step down unit. I would continue him on the D50 as needed as dextrose drip. Monitor his CBG every 2 hours. Serial neurochecks. Since patient has subjective left-sided weakness I would also obtain further workup including an MRI of his brain. Initial CT scan is negative for any acute abnormality.  2. Cirrhosis of liver. Patient's ammonia level at present are within therapeutic range. It is unclear whether patient is compliant with his lactulose. I would continue him on lactulose. I would hold off on Aldactone due to acute kidney injury. Check INR. His CBG dropped could also represent liver dysfunction but his LFTs are not markedly elevated from his baseline.  3. Acute kidney injury Unclear etiology. Possibly multifactorial. At present on hydrating him with IV fluids and monitor his BMP. Foley catheter as needed. [place] not in the morning. 4 metabolic  acidosis most likely secondary to acute kidney injury. Continue to monitor. If it drops further down may require bicarbonate infusion.  4. Chronic pancytopenia. Continue to monitor. Avoid pharmacological DVT prophylaxis.  DVT Prophylaxis: mechanical compression device Nutrition: N.p.o. except medications  Code Status: Full presumed  Disposition: Admitted to inpatient in stepdown unit.  Author: Berle Mull, MD [facility] Pager: [phone] 09/13/2013, 1:27 AM  If 7PM-7AM, please contact night-coverage [email or web address] Password [email or web address]

## 2013-09-13 NOTE — Progress Notes (Signed)
Nutrition Brief Note  Patient identified on the Malnutrition Screening Tool (MST) Report. Pt denies poor appetite and was eating well PTA. Weight has fluctuated. Pt confirms that he eats well when in the hospital. Tolerated clear liquids well for lunch and is ready for solid foods.  Wt Readings from Last 15 Encounters:  09/13/13 205 lb 0.4 oz (93 kg)  08/28/13 207 lb 0.2 oz (93.9 kg)  08/19/13 215 lb (97.523 kg)  08/09/13 205 lb 7.5 oz (93.2 kg)  08/02/13 216 lb (97.977 kg)  07/08/13 232 lb (105.235 kg)  07/08/13 232 lb (105.235 kg)  04/01/13 254 lb 8 oz (115.44 kg)  03/14/13 239 lb 3.2 oz (108.5 kg)  03/14/13 239 lb 3.2 oz (108.5 kg)  03/09/13 232 lb (105.235 kg)  02/05/13 233 lb (105.688 kg)  12/13/12 250 lb (113.4 kg)  11/05/12 215 lb (97.523 kg)  09/15/12 180 lb (81.647 kg)    Body mass index is 28.61 kg/(m^2). Patient meets criteria for overweight based on current BMI.   Current diet order is Clear Liquids, patient is consuming approximately 100% of meals at this time. Labs and medications reviewed.   No nutrition interventions warranted at this time. If nutrition issues arise, please consult RD.   Inda Coke MS, RD, LDN Inpatient Registered Dietitian Pager: [phone] After-hours pager: [phone]

## 2013-09-13 NOTE — Care Management Note (Signed)
    Page 1 of 1   09/13/2013     2:06:06 PM CARE MANAGEMENT NOTE 09/13/2013  Patient:  [name], [name]   Account Number:  [id number]  Date Initiated:  09/13/2013  Documentation initiated by:  Marvetta Gibbons  Subjective/Objective Assessment:   Pt admitted with AMS- hypoglycemia encephalopathy     Action/Plan:   PTA pt was living at home with brother   Anticipated DC Date:  09/16/2013   Anticipated DC Plan:        [facility]  CM consult      Choice offered to / List presented to:             Status of service:  In process, will continue to follow Medicare Important Message given?   (If response is "NO", the following Medicare IM given date fields will be blank) Date Medicare IM given:   Date Additional Medicare IM given:    Discharge Disposition:    Per UR Regulation:  Reviewed for med. necessity/level of care/duration of stay  If discussed at [place] of Stay Meetings, dates discussed:    Comments:  09/13/13- 1400- Marvetta Gibbons RN, BSN [phone] Pt was recently discharged to [facility] SN[facility]- received call from [place] [phone]) per conversation she has been following pt in community for awhile to assist - but pt is difficult to manage- on pt's last admission he discharged to [facility]- and only stayed less than a week before going home with his brother- unsure that brother can provide the care that pt needs- pt has a sig. other Constance Holster [phone]) but per Reino Bellis S0 works during the day and is unable to provide 24/7 care. Pt would benefit from PT/OT evals to assist with recommendations for discharge. NCM to follow for d/c planning. Compliance with medical tx has been an issue with pt in past.

## 2013-09-13 NOTE — Progress Notes (Signed)
Utilization review completed.  

## 2013-09-13 NOTE — Progress Notes (Signed)
RT received order for ABG, however, patient is not in room. Been up to check 2 times. Will continue to check for patient to return.

## 2013-09-13 NOTE — ED Notes (Signed)
Report given to the rn on 3300

## 2013-09-13 NOTE — ED Notes (Signed)
The pt arouses and mumbles and intermittently he talks.  Not moving his lt arm and lt leg.

## 2013-09-13 NOTE — ED Provider Notes (Signed)
I saw and evaluated the patient, reviewed the resident's note and I agree with the findings and plan.   EKG Interpretation   Date/Time:  Thursday September 12 2013 20:08:54 EDT Ventricular Rate:  66 PR Interval:  160 QRS Duration: 93 QT Interval:  440 QTC Calculation: 461 R Axis:   14 Text Interpretation:  Sinus rhythm Similar to prior Confirmed by Mingo Amber   MD, [place] ([id number]) on 09/12/2013 8:11:54 PM      CRITICAL CARE Performed by: Osvaldo Shipper   Total critical care time: 30 minutes  Critical care time was exclusive of separately billable procedures and treating other patients.  Critical care was necessary to treat or prevent imminent or life-threatening deterioration.  Critical care was time spent personally by me on the following activities: development of treatment plan with patient and/or surrogate as well as nursing, discussions with consultants, evaluation of patient's response to treatment, examination of patient, obtaining history from patient or surrogate, ordering and performing treatments and interventions, ordering and review of laboratory studies, ordering and review of radiographic studies, pulse oximetry and re-evaluation of patient's condition.  Patient with chronic liver disease presents with altered mental status, decreased left arm movement. Has been going on all day. Here for equally for hepatic encephalopathy. Vitals stable here. He is hypoglycemic, put on D. 10 drip. Patient is confused, will respond to painful stimuli but is not recurrently alert. Ammonia is 55, improved since prior discharge. Labs show also acute renal failure, uremia, persistent hypoglycemia. Admitted to step down.  Osvaldo Shipper, MD 09/13/13 [phone]

## 2013-09-13 NOTE — Progress Notes (Signed)
[facility] TEAM 1 - Stepdown/ICU TEAM Progress Note  MOHAMMED MCANDREW ALP:[id number] DOB: May 03, 1956 DOA: 09/12/2013 PCP: Barbette Merino, MD  Admit HPI / Brief Narrative: [name] is a 58 y.o WM PMHx alcohol abuse, depression, bipolar affect, depressed, cirrhosis of liver on transplant list, diabetes mellitus, hypertension, CVA, chronic pancytopenia, GERD.  The patient was brought in by EMS as his mental status has changed. The history was obtained from ED documentation, the patient was felt to be poor historian.  As per the ED patient recently moved with his brother after his recent hospitalization. This morning patient's brother found that the patient has been acting confused, has been more sleepy and drowsy less responsive and lethargic.  The patient is coming from home. And at his baseline independent for most of his ADL.   HPI/Subjective: 4/24 A./O. x2 (does not know why/when), very sleepy difficult to arouse but will cooperate with exam.   Assessment/Plan:  Hypoglycemic encephalopathy  -Patient continues to be confused even though his CBG has normalized with D. 10 infusion. -Place on moderate SSI -Continue serial neurochecks.   Cirrhosis of liver.  -Patient's ammonia level= 55umol/L which is within normal range  -Continue patient's home dose lactulose 30 g  TID   Acute Kidney Injury -Patient BUN/creatinine ratio shows patient to be prerenal -Hold Aldactone -Hold all other nephrotoxic medication -Continue aggressive hydration with sodium bicarbonate at 100 mg/hr - Start normal saline 50 ml/hr.   Left sided paresis -Head CT negative for CVA -MRI of brain pending -Did not appreciate weakness this a.m. on exam however patient extremely sleepy difficult to assess  Hyperkalemia -Hold Aldactone should improve with hydration -Obtain BMP, magnesium 1400 today  Acute urinary retention -Please Foley catheter  metabolic acidosis most likely secondary to acute kidney injury.    -Continue to monitor. BMP at 1400 -Continue Bicarbonate infusion. -Obtain ABG   Chronic pancytopenia.  -Continue to monitor.  -Avoid pharmacological DVT prophylaxis -    Code Status: FULL Family Communication: no family present at time of exam Disposition Plan: Home with resolution of mental status change    Consultants:   Procedure/Significant Events: CT head without contrast 09/12/2013 Negative for acute intracranial abnormalities  Culture MRSA by PCR negative   Antibiotics: Rifaximin (chronic)   DVT prophylaxis: SCD   Devices   LINES / TUBES:     Continuous Infusions: . sodium chloride    .  sodium bicarbonate  infusion 1000 mL 75 mL/hr at 09/13/13 0559    Objective: VITAL SIGNS: Temp: 98.1 F (36.7 C) (04/24 0800) Temp src: Oral (04/24 0800) BP: 131/60 mmHg (04/24 0800) Pulse Rate: 72 (04/24 0800) SPO2; 100% on 3 L via [place] FIO2:   Intake/Output Summary (Last 24 hours) at 09/13/13 1016 Last data filed at 09/13/13 0900  Gross per 24 hour  Intake    303 ml  Output      0 ml  Net    303 ml     Exam: General: A./O. x2, (does not know when/why), NAD, sleepy but arousable, will follow commands  Lungs: Clear to auscultation bilaterally without wheezes or crackles Cardiovascular: Regular rate and rhythm without murmur gallop or rub normal S1 and S2 Renalbalance today; 29ml       /overall;        Creatinine ; 1.71       Hourly output   Abdomen: Nontender, nondistended, soft, bowel sounds positive, no rebound, no ascites, no appreciable mass Extremities: No significant cyanosis, clubbing, or edema bilateral  lower extremities  Data Reviewed: Basic Metabolic Panel:  Recent Labs Lab 09/12/13 1921 09/13/13 0315  NA 140 139  K 4.5 5.6*  CL 109 108  CO2 17* 18*  GLUCOSE 40* 115*  BUN 48* 50*  CREATININE 1.72* 1.71*  CALCIUM 9.4 8.9   Liver Function Tests:  Recent Labs Lab 09/12/13 1921 09/13/13 0315  AST 61* 62*  ALT 28 29   ALKPHOS 81 86  BILITOT 1.6* 1.8*  PROT 6.6 6.4  ALBUMIN 3.0* 3.0*   No results found for this basename: LIPASE, AMYLASE,  in the last 168 hours  Recent Labs Lab 09/12/13 2309  AMMONIA 55   CBC:  Recent Labs Lab 09/12/13 1921 09/13/13 0315  WBC 9.2 5.0  HGB 11.1* 10.8*  HCT 31.2* 30.5*  MCV 91.2 91.9  PLT 68* 43*   Cardiac Enzymes: No results found for this basename: CKTOTAL, CKMB, CKMBINDEX, TROPONINI,  in the last 168 hours BNP (last 3 results) No results found for this basename: PROBNP,  in the last 8760 hours CBG:  Recent Labs Lab 09/12/13 2224 09/13/13 0241 09/13/13 0553 09/13/13 0801  GLUCAP 218* 135* 123* 116*    Recent Results (from the past 240 hour(s))  MRSA PCR SCREENING     Status: None   Collection Time    09/13/13  3:05 AM      Result Value Ref Range Status   MRSA by PCR NEGATIVE  NEGATIVE Final   Comment:            The GeneXpert MRSA Assay (FDA     approved for NASAL specimens     only), is one component of a     comprehensive MRSA colonization     surveillance program. It is not     intended to diagnose MRSA     infection nor to guide or     monitor treatment for     MRSA infections.     Studies:  Recent x-ray studies have been reviewed in detail by the Attending Physician  Scheduled Meds:  Scheduled Meds: . folic acid  1 mg Oral Daily  . heparin  5,000 Units Subcutaneous 3 times per day  . insulin aspart  0-15 Units Subcutaneous 6 times per day  . lactulose  30 g Oral TID  . propranolol  20 mg Oral BID  . rifaximin  550 mg Oral BID  . sodium chloride  3 mL Intravenous Q12H    Time spent on care of this patient: 40 mins   Allie Bossier , MD   [facility] Office  [phone] Pager [phone]  On-Call/Text Page:      [email or web address]      password [email or web address]  If 7PM-7AM, please contact night-coverage [email or web address] Password [email or web address] 09/13/2013, 10:16 AM   LOS: 1 day

## 2013-09-14 DIAGNOSIS — R338 Other retention of urine: Secondary | ICD-10-CM

## 2013-09-14 DIAGNOSIS — E875 Hyperkalemia: Secondary | ICD-10-CM

## 2013-09-14 LAB — GLUCOSE, CAPILLARY
GLUCOSE-CAPILLARY: 166 mg/dL — AB (ref 70–99)
GLUCOSE-CAPILLARY: 156 mg/dL — AB (ref 70–99)
GLUCOSE-CAPILLARY: 135 mg/dL — AB (ref 70–99)
Glucose-Capillary: 111 mg/dL — ABNORMAL HIGH (ref 70–99)
Glucose-Capillary: 118 mg/dL — ABNORMAL HIGH (ref 70–99)
Glucose-Capillary: 175 mg/dL — ABNORMAL HIGH (ref 70–99)

## 2013-09-14 MED ORDER — IBUPROFEN 600 MG PO TABS
600.0000 mg | ORAL_TABLET | Freq: Once | ORAL | Status: AC
  Administered 2013-09-14: 600 mg via ORAL
  Filled 2013-09-14: qty 1

## 2013-09-14 NOTE — Progress Notes (Signed)
[facility] TEAM 1 - Stepdown/ICU TEAM Progress Note  [name] ZDG:[id number] DOB: 1955-06-29 DOA: 09/12/2013 PCP: Barbette Merino, MD  Admit HPI / Brief Narrative: [name] is a 58 y.o WM PMHx alcohol abuse, depression, bipolar affect, depressed, cirrhosis of liver on transplant list, diabetes mellitus, hypertension, CVA, chronic pancytopenia, GERD.  The patient was brought in by EMS as his mental status has changed. The history was obtained from ED documentation, the patient was felt to be poor historian.  As per the ED patient recently moved with his brother after his recent hospitalization. This morning patient's brother found that the patient has been acting confused, has been more sleepy and drowsy less responsive and lethargic.  The patient is coming from home. And at his baseline independent for most of his ADL.4/24 A./O. x2 (does not know why/when), very sleepy difficult to arouse but will cooperate with exam.    HPI/Subjective: 4/25 A./O. X4, Now at baseline.    Assessment/Plan:  Hypoglycemic encephalopathy  -Patient Back to baseline mentally.  -CBG have been running 87-166 -Continue  moderate SSI -Advance diet to Hepatic  -D/C serial neurochecks.  -PT/OT consult for Am   Cirrhosis of liver.  -Patient's ammonia level= 55umol/L on admission which is WNL  -Continue patient's home dose lactulose 30 gm  TID, NH4 level does not directly correlate w/Cognition.    Acute Kidney Injury -Patient BUN/creatinine ratio shows patient to be prerenal -Hold Aldactone -Hold all other nephrotoxic medication -Continue aggressive hydration with sodium bicarbonate at 100 mg/hr - Continue normal saline 50 ml/hr. -Admission Scr= 1.72,  4/25 Scr= 1.54   Left sided paresis -Head CT negative for CVA -MRI of brain; (-) for CVA, see results below -resolved  Hyperkalemia -Hold Aldactone should improve with hydration -Resolving -CMP in Am   Acute urinary retention -Please Foley  catheter  metabolic acidosis most likely secondary to acute kidney injury.  -Continue to monitor.  -Continue Bicarbonate infusion.   Chronic pancytopenia.  -Continue to monitor.  -Avoid pharmacological DVT prophylaxis -SCDs    Code Status: FULL Family Communication: no family present at time of exam Disposition Plan: Home with resolution of mental status change    Consultants:   Procedure/Significant Events: Brain MRI w/o contrast 09/13/2013 -Motion degraded exam showing no evidence for acute stroke or intracranial mass lesion.  -Atrophy and small vessel disease are suggested   CT head without contrast 09/12/2013 Negative for acute intracranial abnormalities  Culture MRSA by PCR negative   Antibiotics: Rifaximin (chronic)   DVT prophylaxis: SCD   Devices   LINES / TUBES:     Continuous Infusions: . sodium chloride 50 mL/hr at 09/13/13 1200  .  sodium bicarbonate  infusion 1000 mL 100 mL/hr at 09/14/13 1631    Objective: VITAL SIGNS: Temp: 97.2 F (36.2 C) (04/25 1533) Temp src: Oral (04/25 1533) BP: 123/66 mmHg (04/25 0824) Pulse Rate: 65 (04/25 0824) SPO2; 100% on 3 L via [place] FIO2:   Intake/Output Summary (Last 24 hours) at 09/14/13 1752 Last data filed at 09/14/13 1600  Gross per 24 hour  Intake   4056 ml  Output   5025 ml  Net   -969 ml     Exam: General: A./O. x4,NAD,   Lungs: Clear to auscultation bilaterally without wheezes or crackles Cardiovascular: Regular rate and rhythm without murmur gallop or rub normal S1 and S2 Renalbalance today; 69ml       /overall;        Creatinine ; 1.54  Hourly output   Abdomen: Nontender, nondistended, soft, bowel sounds positive, no rebound, no ascites, no appreciable mass Extremities: No significant cyanosis, clubbing, or edema bilateral lower extremities Neurologic; CN II-XII intact, Extremity strength 5/5, Sensation intact throughout   Data Reviewed: Basic Metabolic Panel:  Recent  Labs Lab 09/12/13 1921 09/13/13 0315 09/13/13 1325  NA 140 139 139  K 4.5 5.6* 4.5  CL 109 108 107  CO2 17* 18* 17*  GLUCOSE 40* 115* 139*  BUN 48* 50* 46*  CREATININE 1.72* 1.71* 1.54*  CALCIUM 9.4 8.9 8.6  MG  --   --  1.8   Liver Function Tests:  Recent Labs Lab 09/12/13 1921 09/13/13 0315  AST 61* 62*  ALT 28 29  ALKPHOS 81 86  BILITOT 1.6* 1.8*  PROT 6.6 6.4  ALBUMIN 3.0* 3.0*   No results found for this basename: LIPASE, AMYLASE,  in the last 168 hours  Recent Labs Lab 09/12/13 2309 09/13/13 1140  AMMONIA 55 80*   CBC:  Recent Labs Lab 09/12/13 1921 09/13/13 0315  WBC 9.2 5.0  HGB 11.1* 10.8*  HCT 31.2* 30.5*  MCV 91.2 91.9  PLT 68* 43*   Cardiac Enzymes: No results found for this basename: CKTOTAL, CKMB, CKMBINDEX, TROPONINI,  in the last 168 hours BNP (last 3 results) No results found for this basename: PROBNP,  in the last 8760 hours CBG:  Recent Labs Lab 09/13/13 2333 09/14/13 0515 09/14/13 0825 09/14/13 1129 09/14/13 1602  GLUCAP 113* 111* 118* 175* 166*    Recent Results (from the past 240 hour(s))  MRSA PCR SCREENING     Status: None   Collection Time    09/13/13  3:05 AM      Result Value Ref Range Status   MRSA by PCR NEGATIVE  NEGATIVE Final   Comment:            The GeneXpert MRSA Assay (FDA     approved for NASAL specimens     only), is one component of a     comprehensive MRSA colonization     surveillance program. It is not     intended to diagnose MRSA     infection nor to guide or     monitor treatment for     MRSA infections.     Studies:  Recent x-ray studies have been reviewed in detail by the Attending Physician  Scheduled Meds:  Scheduled Meds: . folic acid  1 mg Oral Daily  . insulin aspart  0-15 Units Subcutaneous 6 times per day  . lactulose  30 g Oral TID  . propranolol  20 mg Oral BID  . rifaximin  550 mg Oral BID  . sodium chloride  3 mL Intravenous Q12H    Time spent on care of this  patient: 40 mins   Allie Bossier , MD   [facility] Office  [phone] Pager [phone]  On-Call/Text Page:      [email or web address]      password [email or web address]  If 7PM-7AM, please contact night-coverage [email or web address] Password [email or web address] 09/14/2013, 5:52 PM   LOS: 2 days

## 2013-09-15 DIAGNOSIS — E7220 Disorder of urea cycle metabolism, unspecified: Secondary | ICD-10-CM

## 2013-09-15 LAB — COMPREHENSIVE METABOLIC PANEL
ALBUMIN: 2.5 g/dL — AB (ref 3.5–5.2)
ALT: 32 U/L (ref 0–53)
AST: 66 U/L — ABNORMAL HIGH (ref 0–37)
Alkaline Phosphatase: 79 U/L (ref 39–117)
BUN: 15 mg/dL (ref 6–23)
CALCIUM: 8.3 mg/dL — AB (ref 8.4–10.5)
CO2: 28 mEq/L (ref 19–32)
CREATININE: 1.03 mg/dL (ref 0.50–1.35)
Chloride: 104 mEq/L (ref 96–112)
GFR calc Af Amer: >90 mL/min (ref >90)
GFR calc non Af Amer: 79 mL/min — ABNORMAL LOW (ref >90)
Glucose, Bld: 127 mg/dL — ABNORMAL HIGH (ref 70–99)
POTASSIUM: 4.4 meq/L (ref 3.7–5.3)
Sodium: 141 mEq/L (ref 137–147)
TOTAL PROTEIN: 5.7 g/dL — AB (ref 6.0–8.3)
Total Bilirubin: 1.5 mg/dL — ABNORMAL HIGH (ref 0.3–1.2)

## 2013-09-15 LAB — GLUCOSE, CAPILLARY
Glucose-Capillary: 120 mg/dL — ABNORMAL HIGH (ref 70–99)
Glucose-Capillary: 114 mg/dL — ABNORMAL HIGH (ref 70–99)
Glucose-Capillary: 163 mg/dL — ABNORMAL HIGH (ref 70–99)

## 2013-09-15 LAB — CBC WITH DIFFERENTIAL/PLATELET
BASOS PCT: 0 % (ref 0–1)
Basophils Absolute: 0 10*3/uL (ref 0.0–0.1)
EOS ABS: 0.4 10*3/uL (ref 0.0–0.7)
EOS PCT: 11 % — AB (ref 0–5)
HCT: 27.8 % — ABNORMAL LOW (ref 39.0–52.0)
HEMOGLOBIN: 9.6 g/dL — AB (ref 13.0–17.0)
Lymphocytes Relative: 32 % (ref 12–46)
Lymphs Abs: 1.2 10*3/uL (ref 0.7–4.0)
MCH: 32.7 pg (ref 26.0–34.0)
MCHC: 34.5 g/dL (ref 30.0–36.0)
MCV: 94.6 fL (ref 78.0–100.0)
MONO ABS: 0.5 10*3/uL (ref 0.1–1.0)
MONOS PCT: 13 % — AB (ref 3–12)
NEUTROS PCT: 44 % (ref 43–77)
Neutro Abs: 1.6 10*3/uL — ABNORMAL LOW (ref 1.7–7.7)
Platelets: 35 10*3/uL — ABNORMAL LOW (ref 150–400)
RBC: 2.94 MIL/uL — ABNORMAL LOW (ref 4.22–5.81)
RDW: 16.7 % — ABNORMAL HIGH (ref 11.5–15.5)
WBC: 3.7 10*3/uL — ABNORMAL LOW (ref 4.0–10.5)

## 2013-09-15 LAB — MAGNESIUM: Magnesium: 1.5 mg/dL (ref 1.5–2.5)

## 2013-09-15 LAB — AMMONIA: Ammonia: 83 umol/L — ABNORMAL HIGH (ref 11–60)

## 2013-09-15 MED ORDER — LORAZEPAM BOLUS VIA INFUSION: 1.0000 mg | Freq: Once | INTRAVENOUS | Status: DC

## 2013-09-15 MED ORDER — LORAZEPAM 1 MG PO TABS
1.0000 mg | ORAL_TABLET | Freq: Two times a day (BID) | ORAL | Status: DC
  Administered 2013-09-15: 1 mg via ORAL
  Filled 2013-09-15: qty 1

## 2013-09-15 MED ORDER — IBUPROFEN 600 MG PO TABS
600.0000 mg | ORAL_TABLET | Freq: Three times a day (TID) | ORAL | Status: DC
  Administered 2013-09-15: 600 mg via ORAL
  Filled 2013-09-15 (×4): qty 1

## 2013-09-15 MED ORDER — LORAZEPAM 2 MG/ML IJ SOLN
1.0000 mg | Freq: Once | INTRAMUSCULAR | Status: AC
  Administered 2013-09-15: 1 mg via INTRAVENOUS
  Filled 2013-09-15: qty 1

## 2013-09-15 MED ORDER — ESCITALOPRAM OXALATE 5 MG PO TABS
5.0000 mg | ORAL_TABLET | Freq: Every day | ORAL | Status: DC
  Administered 2013-09-15: 5 mg via ORAL
  Filled 2013-09-15: qty 1

## 2013-09-15 MED ORDER — DICLOFENAC SODIUM 1 % TD GEL
2.0000 g | Freq: Two times a day (BID) | TRANSDERMAL | Status: DC
  Administered 2013-09-15: 2 g via TOPICAL
  Filled 2013-09-15: qty 100

## 2013-09-15 NOTE — Progress Notes (Addendum)
Pt is no longer agitated, but is now insisting on leaving AMA. Pt verbalizes he understands the risks involved in leaving against the advice of his doctors. Pt is still slightly paranoid, but oriented to person, place, time and situation. Dr. Sherral Hammers notified. Connye Burkitt, RN

## 2013-09-15 NOTE — Progress Notes (Signed)
[facility] TEAM 1 - Stepdown/ICU TEAM Progress Note  [name] IOX:[id number] DOB: [birth year] DOA: 09/12/2013 PCP: Barbette Merino, MD  Admit HPI / Brief Narrative: [name] is a 58 y.o WM PMHx alcohol abuse, depression, bipolar affect, depressed, cirrhosis of liver on transplant list, diabetes mellitus, hypertension, CVA, chronic pancytopenia, GERD.  The patient was brought in by EMS as his mental status has changed. The history was obtained from ED documentation, the patient was felt to be poor historian.  As per the ED patient recently moved with his brother after his recent hospitalization. This morning patient's brother found that the patient has been acting confused, has been more sleepy and drowsy less responsive and lethargic.  The patient is coming from home. And at his baseline independent for most of his ADL.4/24 A./O. x2 (does not know why/when), very sleepy difficult to arouse but will cooperate with exam.  4/25 A./O. X4, Now at baseline.   HPI/Subjective: 4/26 A./O. X3 (does not know when), however appears to be in a manic state. States a man named cowboy found a liver for him online and he is to receive a transplant at [facility] in May. Now at baseline.    Assessment/Plan:  Hypoglycemic encephalopathy  -Patient Back to baseline mentally.  -CBG have been running 87-166 -Continue  moderate SSI -Advance diet to Hepatic  -D/C serial neurochecks.  -PT/OT consult for Am   Cirrhosis of liver.  -Patient's ammonia level= 55umol/L on admission which is WNL  -Continue patient's home dose lactulose 30 gm  TID, NH4 level does not directly correlate w/Cognition, however will continue to monitor.    Bipolar -Patient has never seen psychiatrist, nor taken medications lately. Appears to be manic currently. -Consulted psychiatry awaiting recommendations  Acute Kidney Injury -Patient BUN/creatinine ratio shows patient to be prerenal -Hold Aldactone -Hold all other nephrotoxic  medication - Patient now eating/drinking normally will decrease normal saline to [facility] . -Admission Scr= 1.72,  4/26 Scr= 1.03   Left sided paresis -Head CT negative for CVA -MRI of brain; (-) for CVA, see results below -resolved  Hyperkalemia -Hold Aldactone should improve with hydration -Resolved -CMP in Am   Acute urinary retention -Please Foley catheter -4/26 patient's mental status improved will DC, and see patient able to urinate  metabolic acidosis most likely secondary to acute kidney injury.  -Continue to monitor.  - resolved with Bicarbonate infusion; will discontinue   Chronic pancytopenia.  -Continue to monitor.  -Avoid pharmacological DVT prophylaxis -SCDs    Code Status: FULL Family Communication: no family present at time of exam Disposition Plan: Home with resolution of mental status change    Consultants:   Procedure/Significant Events: Brain MRI w/o contrast 09/13/2013 -Motion degraded exam showing no evidence for acute stroke or intracranial mass lesion.  -Atrophy and small vessel disease are suggested   CT head without contrast 09/12/2013 Negative for acute intracranial abnormalities  Culture MRSA by PCR negative   Antibiotics: Rifaximin (chronic)   DVT prophylaxis: SCD   Devices   LINES / TUBES:     Continuous Infusions: . sodium chloride 50 mL/hr at 09/15/13 0600    Objective: VITAL SIGNS: Temp: 98.1 F (36.7 C) (04/26 1145) Temp src: Oral (04/26 1145) BP: 126/60 mmHg (04/26 1145) Pulse Rate: 62 (04/26 1145) SPO2; 98% on room air FIO2:   Intake/Output Summary (Last 24 hours) at 09/15/13 1220 Last data filed at 09/15/13 1149  Gross per 24 hour  Intake   2465 ml  Output  4550 ml  Net  -2085 ml     Exam: General:A./O. X3 (does not know when),NAD, hallucinating?  Lungs: Clear to auscultation bilaterally without wheezes or crackles Cardiovascular: Regular rate and rhythm without murmur gallop or rub normal S1  and S2 Renalbalance today; 57ml       /overall;        Creatinine ; 1.54       Hourly output   Abdomen: Nontender, nondistended, soft, bowel sounds positive, no rebound, no ascites, no appreciable mass Extremities: No significant cyanosis, clubbing, or edema bilateral lower extremities Neurologic; CN II-XII intact, Extremity strength 5/5, Sensation intact throughout   Data Reviewed: Basic Metabolic Panel:  Recent Labs Lab 09/12/13 1921 09/13/13 0315 09/13/13 1325 09/15/13 0310  NA 140 139 139 141  K 4.5 5.6* 4.5 4.4  CL 109 108 107 104  CO2 17* 18* 17* 28  GLUCOSE 40* 115* 139* 127*  BUN 48* 50* 46* 15  CREATININE 1.72* 1.71* 1.54* 1.03  CALCIUM 9.4 8.9 8.6 8.3*  MG  --   --  1.8 1.5   Liver Function Tests:  Recent Labs Lab 09/12/13 1921 09/13/13 0315 09/15/13 0310  AST 61* 62* 66*  ALT 28 29 32  ALKPHOS 81 86 79  BILITOT 1.6* 1.8* 1.5*  PROT 6.6 6.4 5.7*  ALBUMIN 3.0* 3.0* 2.5*   No results found for this basename: LIPASE, AMYLASE,  in the last 168 hours  Recent Labs Lab 09/12/13 2309 09/13/13 1140  AMMONIA 55 80*   CBC:  Recent Labs Lab 09/12/13 1921 09/13/13 0315 09/15/13 0310  WBC 9.2 5.0 3.7*  NEUTROABS  --   --  1.6*  HGB 11.1* 10.8* 9.6*  HCT 31.2* 30.5* 27.8*  MCV 91.2 91.9 94.6  PLT 68* 43* 35*   Cardiac Enzymes: No results found for this basename: CKTOTAL, CKMB, CKMBINDEX, TROPONINI,  in the last 168 hours BNP (last 3 results) No results found for this basename: PROBNP,  in the last 8760 hours CBG:  Recent Labs Lab 09/14/13 1602 09/14/13 2045 09/14/13 2302 09/15/13 0424 09/15/13 0753  GLUCAP 166* 156* 135* 120* 114*    Recent Results (from the past 240 hour(s))  MRSA PCR SCREENING     Status: None   Collection Time    09/13/13  3:05 AM      Result Value Ref Range Status   MRSA by PCR NEGATIVE  NEGATIVE Final   Comment:            The GeneXpert MRSA Assay (FDA     approved for NASAL specimens     only), is one component of  a     comprehensive MRSA colonization     surveillance program. It is not     intended to diagnose MRSA     infection nor to guide or     monitor treatment for     MRSA infections.     Studies:  Recent x-ray studies have been reviewed in detail by the Attending Physician  Scheduled Meds:  Scheduled Meds: . folic acid  1 mg Oral Daily  . ibuprofen  600 mg Oral TID  . insulin aspart  0-15 Units Subcutaneous 6 times per day  . lactulose  30 g Oral TID  . propranolol  20 mg Oral BID  . rifaximin  550 mg Oral BID  . sodium chloride  3 mL Intravenous Q12H    Time spent on care of this patient: 40 mins   Allie Bossier ,  MD   [facility] Office  [phone] Pager - [phone]  On-Call/Text Page:      [email or web address]      password [email or web address]  If 7PM-7AM, please contact night-coverage [email or web address] Password [email or web address] 09/15/2013, 12:20 PM   LOS: 3 days

## 2013-09-15 NOTE — Progress Notes (Signed)
Dr. Sherral Hammers notified via text page that pt has become agitated and tearful. Connye Burkitt, RN

## 2013-09-15 NOTE — Evaluation (Signed)
Physical Therapy Evaluation Patient Details Name: [name] MRN: [id number] DOB: [birth year] Today's Date: 09/15/2013   History of Present Illness  [name] is a 58 y.o. male with Past medical history of cirrhosis of liver on transplant list, diabetes mellitus, hypertension, CVA, chronic pancytopenia, GERD.  Clinical Impression  Pt adm due to the above. Pt at supervision level at this time for mobility due to manic state and decreased safety awareness. From mobility standpoint pt appears to be at his baseline, with no focal weakness or balance deficits noted. Throughout session pt was very anxious and impulsive. Pt taking of  EKG leads throughout session. Pt encouraged to ambulate with nursing as tolerated. No acute PT needs warranted at this time.     Follow Up Recommendations No PT follow up;Supervision/Assistance - 24 hour;Other (comment) (for safety)    Equipment Recommendations  None recommended by PT    Recommendations for Other Services       Precautions / Restrictions Precautions Precautions: Fall Precaution Comments: fall risk due to impulsivity  Restrictions Weight Bearing Restrictions: No      Mobility  Bed Mobility               General bed mobility comments: not addressed; pt up in chair and returned to chair   Transfers Overall transfer level: Modified independent Equipment used: None Transfers: Sit to/from Stand Sit to Stand: Modified independent (Device/Increase time)         General transfer comment: use of armrests; no LOB noted  Ambulation/Gait Ambulation/Gait assistance: Supervision Ambulation Distance (Feet): 300 Feet Assistive device: None Gait Pattern/deviations: WFL(Within Functional Limits)   Gait velocity interpretation: at or above normal speed for age/gender General Gait Details: pt at supervision level only for safety due to cognition deficits; challenged with high level balance activities and NO LOB noted   Stairs             Wheelchair Mobility    Modified Rankin (Stroke Patients Only)       Balance Overall balance assessment: Modified Independent;History of Falls (no balance deficits noted with high level activites today)                           High level balance activites: Head turns;Direction changes;Turns;Backward walking High Level Balance Comments: no LOB noted with challenges in balance; pt able to pick objects up off ground;             Pertinent Vitals/Pain Denies any pain     Home Living Family/patient expects to be discharged to:: Private residence Living Arrangements: Other (Comment);Spouse/significant other (fiance ) Available Help at Discharge: Family;Available 24 hours/day Type of Home: Other(Comment) (pt also reports living in motel and no steps; ? historian ) Home Access: Stairs to enter Entrance Stairs-Rails: Right Entrance Stairs-Number of Steps: 3 Home Layout: One level Home Equipment: None Additional Comments: pt reports he lives with his fiance; reports they've been engaged for 25 years; fiance present at end of session to confirm     Prior Function Level of Independence: Independent         Comments: pt denies use of RW or any AD for mobility      Hand Dominance   Dominant Hand: Right    Extremity/Trunk Assessment   Upper Extremity Assessment: Overall WFL for tasks assessed           Lower Extremity Assessment: Overall WFL for tasks assessed  Cervical / Trunk Assessment: Normal  Communication   Communication: No difficulties  Cognition Arousal/Alertness: Awake/alert Behavior During Therapy: Impulsive Overall Cognitive Status: Impaired/Different from baseline Area of Impairment: Orientation;Safety/judgement;Awareness;Problem solving;Memory Orientation Level: Disoriented to;Time;Situation   Memory: Decreased short-term memory Following Commands: Follows one step commands consistently Safety/Judgement: Decreased  awareness of safety     General Comments: pt in manic like state at this time; pt impulsive and asking same questions multiple times; pt attempting to get dressed and "walk out of here"     General Comments General comments (skin integrity, edema, etc.): pt with multiple pills on bed; from home RN made aware     Exercises        Assessment/Plan    PT Assessment Patent does not need any further PT services  PT Diagnosis     PT Problem List    PT Treatment Interventions     PT Goals (Current goals can be found in the Care Plan section) Acute Rehab PT Goals Patient Stated Goal: go to the company and get my transplant  PT Goal Formulation: No goals set, d/c therapy    Frequency     Barriers to discharge        Co-evaluation               End of Session Equipment Utilized During Treatment: Gait belt Activity Tolerance: Patient tolerated treatment well Patient left: in chair;with call bell/phone within reach;with family/visitor present;with nursing/sitter in room Nurse Communication: Mobility status         Time: 5852-7782 PT Time Calculation (min): 15 min   Charges:   PT Evaluation $Initial PT Evaluation Tier I: 1 Procedure PT Treatments $Gait Training: 8-22 mins   PT G CodesKennis Carina [place], [place] 09/15/2013, 3:27 PM

## 2013-09-15 NOTE — Progress Notes (Signed)
Pt is now leaving the hospital with all his belongings, including the $115.00 in cash. Connye Burkitt, RN

## 2013-09-15 NOTE — Progress Notes (Signed)
Dr. Sherral Hammers notified that pt states he doesn't want any information given to his brother; pt states that we can speak with his fiance, Serita Butcher. I called Constance Holster and updated her on the situation -- she also states that pt is not back to normal. I told her that if the pt's brother calls, we will update him on pt's request. Connye Burkitt, RN

## 2013-09-15 NOTE — Consult Note (Signed)
[facility] Face-to-Face Psychiatry Consult   Reason for Consult:  Depression Referring Physician:  Dr Ihor Dow is an 58 y.o. male. Total Time spent with patient: 20 minutes  Assessment: AXIS I:  Depressive Disorder NOS AXIS II:  Deferred AXIS III:   Past Medical History  Diagnosis Date  . Neuropathy   . Diabetes mellitus   . Bipolar affect, depressed   . Hypertension   . Arthritis   . Stroke     Mini stroke about 73yr ago  . Cirrhosis   . Alcohol abuse   . Chronic pain   . Cocaine abuse   . Muscle spasm     both legs  . Encephalopathy, hepatic   . Detached retina   . COPD (chronic obstructive pulmonary disease)     emphysema  . Bronchitis   . Barrett's esophagus   . GERD (gastroesophageal reflux disease)     has ulcer  . Anemia    AXIS IV:  other psychosocial or environmental problems and problems related to social environment AXIS V:  51-60 moderate symptoms  Plan:  No evidence of imminent risk to self or others at present.   Patient does not meet criteria for psychiatric inpatient admission. Supportive therapy provided about ongoing stressors. Discussed crisis plan, support from social network, calling 911, coming to the Emergency Department, and calling Suicide Hotline.  Subjective:   Darrell KERBYis a 58y.o. male patient admitted with change in his mental status.  HPI:  Patient seen chart reviewed.  Patient's 58year old Caucasian man who was admitted on the medical floor because of a change in his mental status.  Consult was called because patient has been depressed.  Patient is feeling better since yesterday.  Patient is known to this wProbation officer  He was seen in November 2014 and consultation liaison services.  At that time he was feeling very weak lethargic and having repeated falls.  We started him on Lexapro however patient admitted that he is noncompliant with his medications.  He stopped taking medication after a week because he felt it was not working.   Patient admitted he has been more time as he does not have any side effects with Lexapro.  Patient endorsed chronic depression because of his health issues.  He is on transplant list.  The patient used to see psychiatrist at [facility] however he has been non-compliant with followups because he couldn't afford the co-pay .  Patient realizes he needs to go back to see psychiatrist and continue to keep appointment with the counselor.  Patient denies any paranoia, hallucination, suicidal thoughts or homicidal thoughts.  He reported good relationship with his fiance .  perform coping .  Patient denies any recent drinking or any alcohol use.  Patient is cooperative.  He is not combative and relevant in conversation.  He is willing to do try low-dose Lexapro however he wants outpatient counseling where he can afford.   Past Psychiatric History: Past Medical History  Diagnosis Date  . Neuropathy   . Diabetes mellitus   . Bipolar affect, depressed   . Hypertension   . Arthritis   . Stroke     Mini stroke about 89yrago  . Cirrhosis   . Alcohol abuse   . Chronic pain   . Cocaine abuse   . Muscle spasm     both legs  . Encephalopathy, hepatic   . Detached retina   . COPD (chronic obstructive pulmonary disease)  emphysema  . Bronchitis   . Barrett's esophagus   . GERD (gastroesophageal reflux disease)     has ulcer  . Anemia     reports that he has been smoking Cigarettes.  He has a 30 pack-year smoking history. He has never used smokeless tobacco. He reports that he drinks alcohol. He reports that he uses illicit drugs (Cocaine). Family History  Problem Relation Age of Onset  . Hypotension Mother          Abuse/Neglect [facility]) Physical Abuse: Denies Verbal Abuse: Denies Sexual Abuse: Denies Allergies:  No Known Allergies   ACT Assessment Complete: No:   Past Psychiatric History:  Diagnosis: Alcohol abuse , depression  Hospitalizations: He admitted in past at behavior  [place]: None, used to be [facility]  Substance Abuse Care: History of alcohol abuse   Self-Mutilation: Denies   Suicidal Attempts: Denies   Homicidal Behaviors: Denies   Violent Behaviors: Denies   Place of Residence: Lives with his fiance  Marital Status: Divorced  Employed/Unemployed: On disability  Objective: Blood pressure 126/60, pulse 62, temperature 98.1 F (36.7 C), temperature source Oral, resp. rate 17, height 5' 11" (1.803 m), weight 186 lb 1.1 oz (84.4 kg), SpO2 98.00%.Body mass index is 25.96 kg/(m^2). Results for orders placed during the hospital encounter of 09/12/13 (from the past 72 hour(s))  CBC     Status: Abnormal   Collection Time    09/12/13  7:21 PM      Result Value Ref Range   WBC 9.2  4.0 - 10.5 K/uL   RBC 3.42 (*) 4.22 - 5.81 MIL/uL   Hemoglobin 11.1 (*) 13.0 - 17.0 g/dL   HCT 31.2 (*) 39.0 - 52.0 %   MCV 91.2  78.0 - 100.0 fL   MCH 32.5  26.0 - 34.0 pg   MCHC 35.6  30.0 - 36.0 g/dL   RDW 17.1 (*) 11.5 - 15.5 %   Platelets 68 (*) 150 - 400 K/uL   Comment: REPEATED TO VERIFY     SPECIMEN CHECKED FOR CLOTS     PLATELETS APPEAR DECREASED     PLATELET COUNT CONFIRMED BY SMEAR  COMPREHENSIVE METABOLIC PANEL     Status: Abnormal   Collection Time    09/12/13  7:21 PM      Result Value Ref Range   Sodium 140  137 - 147 mEq/L   Potassium 4.5  3.7 - 5.3 mEq/L   Chloride 109  96 - 112 mEq/L   CO2 17 (*) 19 - 32 mEq/L   Glucose, Bld 40 (*) 70 - 99 mg/dL   Comment: CRITICAL RESULT CALLED TO, READ BACK BY AND VERIFIED WITH:     CHRISCO,C RN 09/12/2013 [id number] [name]     REPEATED TO VERIFY   BUN 48 (*) 6 - 23 mg/dL   Creatinine, Ser 1.72 (*) 0.50 - 1.35 mg/dL   Calcium 9.4  8.4 - 10.5 mg/dL   Total Protein 6.6  6.0 - 8.3 g/dL   Albumin 3.0 (*) 3.5 - 5.2 g/dL   AST 61 (*) 0 - 37 U/L   ALT 28  0 - 53 U/L   Alkaline Phosphatase 81  39 - 117 U/L   Total Bilirubin 1.6 (*) 0.3 - 1.2 mg/dL   GFR calc non Af Amer 42 (*) >90  mL/min   GFR calc Af Amer 49 (*) >90 mL/min   Comment: (NOTE)     The eGFR has been calculated using  the CKD EPI equation.     This calculation has not been validated in all clinical situations.     eGFR's persistently <90 mL/min signify possible Chronic Kidney     Disease.  [name], ED     Status: None   Collection Time    09/12/13  8:49 PM      Result Value Ref Range   Troponin i, poc 0.01  0.00 - 0.08 ng/mL   Comment 3            Comment: Due to the release kinetics of cTnI,     a negative result within the first hours     of the onset of symptoms does not rule out     myocardial infarction with certainty.     If myocardial infarction is still suspected,     repeat the test at appropriate intervals.  I-STAT CG4 LACTIC ACID, ED     Status: None   Collection Time    09/12/13  8:51 PM      Result Value Ref Range   Lactic Acid, Venous 1.46  0.5 - 2.2 mmol/L  I-STAT VENOUS BLOOD GAS, ED     Status: Abnormal   Collection Time    09/12/13  8:55 PM      Result Value Ref Range   pH, Ven 7.416 (*) 7.250 - 7.300   pCO2, Ven 29.1 (*) 45.0 - 50.0 mmHg   pO2, Ven 70.0 (*) 30.0 - 45.0 mmHg   Bicarbonate 18.7 (*) 20.0 - 24.0 mEq/L   TCO2 20  0 - 100 mmol/L   O2 Saturation 94.0     Acid-base deficit 5.0 (*) 0.0 - 2.0 mmol/L   Sample type VENOUS    CBG MONITORING, ED     Status: Abnormal   Collection Time    09/12/13 10:24 PM      Result Value Ref Range   Glucose-Capillary 218 (*) 70 - 99 mg/dL  AMMONIA     Status: None   Collection Time    09/12/13 11:09 PM      Result Value Ref Range   Ammonia 55  11 - 60 umol/L  GLUCOSE, CAPILLARY     Status: Abnormal   Collection Time    09/12/13 11:19 PM      Result Value Ref Range   Glucose-Capillary 165 (*) 70 - 99 mg/dL  URINALYSIS, ROUTINE W REFLEX MICROSCOPIC     Status: Abnormal   Collection Time    09/13/13  1:31 AM      Result Value Ref Range   Color, Urine AMBER (*) YELLOW   Comment: BIOCHEMICALS MAY BE AFFECTED BY  COLOR   APPearance CLEAR  CLEAR   Specific Gravity, Urine 1.026  1.005 - 1.030   pH 5.0  5.0 - 8.0   Glucose, UA NEGATIVE  NEGATIVE mg/dL   Hgb urine dipstick SMALL (*) NEGATIVE   Bilirubin Urine NEGATIVE  NEGATIVE   Ketones, ur 15 (*) NEGATIVE mg/dL   Protein, ur NEGATIVE  NEGATIVE mg/dL   Urobilinogen, UA 0.2  0.0 - 1.0 mg/dL   Nitrite NEGATIVE  NEGATIVE   Leukocytes, UA NEGATIVE  NEGATIVE  URINE RAPID DRUG SCREEN (HOSP PERFORMED)     Status: Abnormal   Collection Time    09/13/13  1:31 AM      Result Value Ref Range   Opiates POSITIVE (*) NONE DETECTED   Cocaine NONE DETECTED  NONE DETECTED   Benzodiazepines POSITIVE (*) NONE DETECTED  Amphetamines NONE DETECTED  NONE DETECTED   Tetrahydrocannabinol NONE DETECTED  NONE DETECTED   Barbiturates NONE DETECTED  NONE DETECTED   Comment:            DRUG SCREEN FOR MEDICAL PURPOSES     ONLY.  IF CONFIRMATION IS NEEDED     FOR ANY PURPOSE, NOTIFY LAB     WITHIN 5 DAYS.                LOWEST DETECTABLE LIMITS     FOR URINE DRUG SCREEN     Drug Class       Cutoff (ng/mL)     Amphetamine      1000     Barbiturate      200     Benzodiazepine   433     Tricyclics       295     Opiates          300     Cocaine          300     THC              50  URINE MICROSCOPIC-ADD ON     Status: Abnormal   Collection Time    09/13/13  1:31 AM      Result Value Ref Range   WBC, UA 0-2  <3 WBC/hpf   RBC / HPF 3-6  <3 RBC/hpf   Bacteria, UA RARE  RARE   Casts HYALINE CASTS (*) NEGATIVE  GLUCOSE, CAPILLARY     Status: Abnormal   Collection Time    09/13/13  2:06 AM      Result Value Ref Range   Glucose-Capillary 127 (*) 70 - 99 mg/dL  GLUCOSE, CAPILLARY     Status: Abnormal   Collection Time    09/13/13  2:41 AM      Result Value Ref Range   Glucose-Capillary 135 (*) 70 - 99 mg/dL   Comment 1 Notify RN     Comment 2 Documented in Chart    MRSA PCR SCREENING     Status: None   Collection Time    09/13/13  3:05 AM      Result Value  Ref Range   MRSA by PCR NEGATIVE  NEGATIVE   Comment:            The GeneXpert MRSA Assay (FDA     approved for NASAL specimens     only), is one component of a     comprehensive MRSA colonization     surveillance program. It is not     intended to diagnose MRSA     infection nor to guide or     monitor treatment for     MRSA infections.  ETHANOL     Status: None   Collection Time    09/13/13  3:15 AM      Result Value Ref Range   Alcohol, Ethyl (B) <11  0 - 11 mg/dL   Comment:            LOWEST DETECTABLE LIMIT FOR     SERUM ALCOHOL IS 11 mg/dL     FOR MEDICAL PURPOSES ONLY  COMPREHENSIVE METABOLIC PANEL     Status: Abnormal   Collection Time    09/13/13  3:15 AM      Result Value Ref Range   Sodium 139  137 - 147 mEq/L   Potassium 5.6 (*) 3.7 - 5.3 mEq/L   Comment: DELTA  CHECK NOTED   Chloride 108  96 - 112 mEq/L   CO2 18 (*) 19 - 32 mEq/L   Glucose, Bld 115 (*) 70 - 99 mg/dL   BUN 50 (*) 6 - 23 mg/dL   Creatinine, Ser 1.71 (*) 0.50 - 1.35 mg/dL   Calcium 8.9  8.4 - 10.5 mg/dL   Total Protein 6.4  6.0 - 8.3 g/dL   Albumin 3.0 (*) 3.5 - 5.2 g/dL   AST 62 (*) 0 - 37 U/L   ALT 29  0 - 53 U/L   Alkaline Phosphatase 86  39 - 117 U/L   Total Bilirubin 1.8 (*) 0.3 - 1.2 mg/dL   GFR calc non Af Amer 43 (*) >90 mL/min   GFR calc Af Amer 49 (*) >90 mL/min   Comment: (NOTE)     The eGFR has been calculated using the CKD EPI equation.     This calculation has not been validated in all clinical situations.     eGFR's persistently <90 mL/min signify possible Chronic Kidney     Disease.  CBC     Status: Abnormal   Collection Time    09/13/13  3:15 AM      Result Value Ref Range   WBC 5.0  4.0 - 10.5 K/uL   RBC 3.32 (*) 4.22 - 5.81 MIL/uL   Hemoglobin 10.8 (*) 13.0 - 17.0 g/dL   HCT 30.5 (*) 39.0 - 52.0 %   MCV 91.9  78.0 - 100.0 fL   MCH 32.5  26.0 - 34.0 pg   MCHC 35.4  30.0 - 36.0 g/dL   RDW 17.0 (*) 11.5 - 15.5 %   Platelets 43 (*) 150 - 400 K/uL   Comment: REPEATED  TO VERIFY     SPECIMEN CHECKED FOR CLOTS     CONSISTENT WITH PREVIOUS RESULT  PROTIME-INR     Status: Abnormal   Collection Time    09/13/13  3:15 AM      Result Value Ref Range   Prothrombin Time 16.3 (*) 11.6 - 15.2 seconds   INR 1.34  0.00 - 1.49  GLUCOSE, CAPILLARY     Status: Abnormal   Collection Time    09/13/13  5:53 AM      Result Value Ref Range   Glucose-Capillary 123 (*) 70 - 99 mg/dL  GLUCOSE, CAPILLARY     Status: Abnormal   Collection Time    09/13/13  8:01 AM      Result Value Ref Range   Glucose-Capillary 116 (*) 70 - 99 mg/dL   Comment 1 Notify RN     Comment 2 Documented in Chart    GLUCOSE, CAPILLARY     Status: None   Collection Time    09/13/13 11:24 AM      Result Value Ref Range   Glucose-Capillary 87  70 - 99 mg/dL   Comment 1 Notify RN     Comment 2 Documented in Chart    AMMONIA     Status: Abnormal   Collection Time    09/13/13 11:40 AM      Result Value Ref Range   Ammonia 80 (*) 11 - 60 umol/L  HEMOGLOBIN A1C     Status: Abnormal   Collection Time    09/13/13 11:40 AM      Result Value Ref Range   Hemoglobin A1C 7.5 (*) <5.7 %   Comment: (NOTE)  According to the ADA Clinical Practice Recommendations for 2011, when     HbA1c is used as a screening test:      >=6.5%   Diagnostic of Diabetes Mellitus               (if abnormal result is confirmed)     5.7-6.4%   Increased risk of developing Diabetes Mellitus     References:Diagnosis and Classification of Diabetes Mellitus,Diabetes     EAVW,0981,19(JYNWG 1):S62-S69 and Standards of Medical Care in             Diabetes - 2011,Diabetes NFAO,1308,65 (Suppl 1):S11-S61.   Mean Plasma Glucose 169 (*) <117 mg/dL   Comment: Performed at [facility]: None   Collection Time    09/13/13 11:40 AM      Result Value Ref Range   Cholesterol 142  0 - 200 mg/dL   Triglycerides 75  <150 mg/dL    HDL 45  >39 mg/dL   Total CHOL/HDL Ratio 3.2     VLDL 15  0 - 40 mg/dL   LDL Cholesterol 82  0 - 99 mg/dL   Comment:            Total Cholesterol/HDL:CHD Risk     Coronary Heart Disease Risk Table                         Men   Women      1/2 Average Risk   3.4   3.3      Average Risk       5.0   4.4      2 X Average Risk   9.6   7.1      3 X Average Risk  23.4   11.0                Use the calculated Patient Ratio     above and the CHD Risk Table     to determine the patient's CHD Risk.                ATP III CLASSIFICATION (LDL):      <100     mg/dL   Optimal      100-129  mg/dL   Near or Above                        Optimal      130-159  mg/dL   Borderline      160-189  mg/dL   High      >190     mg/dL   Very High  BASIC METABOLIC PANEL     Status: Abnormal   Collection Time    09/13/13  1:25 PM      Result Value Ref Range   Sodium 139  137 - 147 mEq/L   Potassium 4.5  3.7 - 5.3 mEq/L   Comment: DELTA CHECK NOTED   Chloride 107  96 - 112 mEq/L   CO2 17 (*) 19 - 32 mEq/L   Glucose, Bld 139 (*) 70 - 99 mg/dL   BUN 46 (*) 6 - 23 mg/dL   Creatinine, Ser 1.54 (*) 0.50 - 1.35 mg/dL   Calcium 8.6  8.4 - 10.5 mg/dL   GFR calc non Af Amer 48 (*) >90 mL/min   GFR calc Af Amer 56 (*) >90 mL/min   Comment: (NOTE)  The eGFR has been calculated using the CKD EPI equation.     This calculation has not been validated in all clinical situations.     eGFR's persistently <90 mL/min signify possible Chronic Kidney     Disease.  MAGNESIUM     Status: None   Collection Time    09/13/13  1:25 PM      Result Value Ref Range   Magnesium 1.8  1.5 - 2.5 mg/dL  GLUCOSE, CAPILLARY     Status: Abnormal   Collection Time    09/13/13  3:59 PM      Result Value Ref Range   Glucose-Capillary 154 (*) 70 - 99 mg/dL   Comment 1 Notify RN     Comment 2 Documented in Chart    GLUCOSE, CAPILLARY     Status: Abnormal   Collection Time    09/13/13  8:08 PM      Result Value Ref Range    Glucose-Capillary 154 (*) 70 - 99 mg/dL  GLUCOSE, CAPILLARY     Status: Abnormal   Collection Time    09/13/13 11:33 PM      Result Value Ref Range   Glucose-Capillary 113 (*) 70 - 99 mg/dL  GLUCOSE, CAPILLARY     Status: Abnormal   Collection Time    09/14/13  5:15 AM      Result Value Ref Range   Glucose-Capillary 111 (*) 70 - 99 mg/dL  GLUCOSE, CAPILLARY     Status: Abnormal   Collection Time    09/14/13  8:25 AM      Result Value Ref Range   Glucose-Capillary 118 (*) 70 - 99 mg/dL   Comment 1 Documented in Chart     Comment 2 Notify RN    GLUCOSE, CAPILLARY     Status: Abnormal   Collection Time    09/14/13 11:29 AM      Result Value Ref Range   Glucose-Capillary 175 (*) 70 - 99 mg/dL   Comment 1 Documented in Chart     Comment 2 Notify RN    GLUCOSE, CAPILLARY     Status: Abnormal   Collection Time    09/14/13  4:02 PM      Result Value Ref Range   Glucose-Capillary 166 (*) 70 - 99 mg/dL   Comment 1 Documented in Chart     Comment 2 Notify RN    GLUCOSE, CAPILLARY     Status: Abnormal   Collection Time    09/14/13  8:45 PM      Result Value Ref Range   Glucose-Capillary 156 (*) 70 - 99 mg/dL   Comment 1 Notify RN    GLUCOSE, CAPILLARY     Status: Abnormal   Collection Time    09/14/13 11:02 PM      Result Value Ref Range   Glucose-Capillary 135 (*) 70 - 99 mg/dL   Comment 1 Notify RN    CBC WITH DIFFERENTIAL     Status: Abnormal   Collection Time    09/15/13  3:10 AM      Result Value Ref Range   WBC 3.7 (*) 4.0 - 10.5 K/uL   RBC 2.94 (*) 4.22 - 5.81 MIL/uL   Hemoglobin 9.6 (*) 13.0 - 17.0 g/dL   HCT 27.8 (*) 39.0 - 52.0 %   MCV 94.6  78.0 - 100.0 fL   MCH 32.7  26.0 - 34.0 pg   MCHC 34.5  30.0 - 36.0 g/dL   RDW 16.7 (*)  11.5 - 15.5 %   Platelets 35 (*) 150 - 400 K/uL   Comment: CONSISTENT WITH PREVIOUS RESULT   Neutrophils Relative % 44  43 - 77 %   Neutro Abs 1.6 (*) 1.7 - 7.7 K/uL   Lymphocytes Relative 32  12 - 46 %   Lymphs Abs 1.2  0.7 - 4.0  K/uL   Monocytes Relative 13 (*) 3 - 12 %   Monocytes Absolute 0.5  0.1 - 1.0 K/uL   Eosinophils Relative 11 (*) 0 - 5 %   Eosinophils Absolute 0.4  0.0 - 0.7 K/uL   Basophils Relative 0  0 - 1 %   Basophils Absolute 0.0  0.0 - 0.1 K/uL  COMPREHENSIVE METABOLIC PANEL     Status: Abnormal   Collection Time    09/15/13  3:10 AM      Result Value Ref Range   Sodium 141  137 - 147 mEq/L   Potassium 4.4  3.7 - 5.3 mEq/L   Chloride 104  96 - 112 mEq/L   CO2 28  19 - 32 mEq/L   Glucose, Bld 127 (*) 70 - 99 mg/dL   BUN 15  6 - 23 mg/dL   Comment: DELTA CHECK NOTED   Creatinine, Ser 1.03  0.50 - 1.35 mg/dL   Calcium 8.3 (*) 8.4 - 10.5 mg/dL   Total Protein 5.7 (*) 6.0 - 8.3 g/dL   Albumin 2.5 (*) 3.5 - 5.2 g/dL   AST 66 (*) 0 - 37 U/L   ALT 32  0 - 53 U/L   Alkaline Phosphatase 79  39 - 117 U/L   Total Bilirubin 1.5 (*) 0.3 - 1.2 mg/dL   GFR calc non Af Amer 79 (*) >90 mL/min   GFR calc Af Amer >90  >90 mL/min   Comment: (NOTE)     The eGFR has been calculated using the CKD EPI equation.     This calculation has not been validated in all clinical situations.     eGFR's persistently <90 mL/min signify possible Chronic Kidney     Disease.  MAGNESIUM     Status: None   Collection Time    09/15/13  3:10 AM      Result Value Ref Range   Magnesium 1.5  1.5 - 2.5 mg/dL  GLUCOSE, CAPILLARY     Status: Abnormal   Collection Time    09/15/13  4:24 AM      Result Value Ref Range   Glucose-Capillary 120 (*) 70 - 99 mg/dL  GLUCOSE, CAPILLARY     Status: Abnormal   Collection Time    09/15/13  7:53 AM      Result Value Ref Range   Glucose-Capillary 114 (*) 70 - 99 mg/dL  AMMONIA     Status: Abnormal   Collection Time    09/15/13 11:27 AM      Result Value Ref Range   Ammonia 83 (*) 11 - 60 umol/L  GLUCOSE, CAPILLARY     Status: Abnormal   Collection Time    09/15/13 12:10 PM      Result Value Ref Range   Glucose-Capillary 163 (*) 70 - 99 mg/dL   Labs are reviewed.  Current  Facility-Administered Medications  Medication Dose Route Frequency Provider Last Rate Last Dose  . 0.9 %  sodium chloride infusion   Intravenous Continuous Allie Bossier, MD 10 mL/hr at 09/15/13 1309    . dextrose 50 % solution 25 mL  25 mL Intravenous  PRN Berle Mull, MD      . folic acid (FOLVITE) tablet 1 mg  1 mg Oral Daily Berle Mull, MD   1 mg at 09/15/13 0926  . ibuprofen (ADVIL,MOTRIN) tablet 600 mg  600 mg Oral TID Allie Bossier, MD   600 mg at 09/15/13 1014  . insulin aspart (novoLOG) injection 0-15 Units  0-15 Units Subcutaneous 6 times per day Allie Bossier, MD   3 Units at 09/15/13 1309  . lactulose (CHRONULAC) 10 GM/15ML solution 30 g  30 g Oral TID Berle Mull, MD   30 g at 09/15/13 0929  . ondansetron (ZOFRAN) tablet 4 mg  4 mg Oral Q6H PRN Berle Mull, MD       Or  . ondansetron (ZOFRAN) injection 4 mg  4 mg Intravenous Q6H PRN Berle Mull, MD      . propranolol (INDERAL) tablet 20 mg  20 mg Oral BID Berle Mull, MD   20 mg at 09/15/13 0926  . rifaximin (XIFAXAN) tablet 550 mg  550 mg Oral BID Berle Mull, MD   550 mg at 09/15/13 0926  . sodium chloride 0.9 % injection 3 mL  3 mL Intravenous Q12H Berle Mull, MD   3 mL at 09/15/13 0926    Psychiatric Specialty Exam:     Blood pressure 126/60, pulse 62, temperature 98.1 F (36.7 C), temperature source Oral, resp. rate 17, height 5' 11" (1.803 m), weight 186 lb 1.1 oz (84.4 kg), SpO2 98.00%.Body mass index is 25.96 kg/(m^2).  General Appearance: Casual  Eye Contact::  Fair  Speech:  Normal Rate and Slow  Volume:  Normal  Mood:  Anxious  Affect:  Congruent  Thought Process:  Logical  Orientation:  Full (Time, Place, and Person)  Thought Content:  Rumination  Suicidal Thoughts:  No  Homicidal Thoughts:  No  Memory:  Immediate;   Fair Recent;   Fair Remote;   Fair  Judgement:  Intact  Insight:  Fair  Psychomotor Activity:  Decreased  Concentration:  Fair  Recall:  [employer] of Knowledge:Fair   Language: Fair  Akathisia:  No  Handed:  Right  AIMS (if indicated):     Assets:  Communication Skills Desire for Improvement Housing Social Support  Sleep:      Musculoskeletal: Strength & Muscle Tone: within normal limits Gait & Station: patient is lying on his bed. Patient leans: N/A  Treatment Plan Summary: Medication management, restart Lexapro 5 mg if not medically contraindicated.  Patient does not recall any side effects from Lexapro when he took for one week.  The patient does not need inpatient psychiatric services however he should be seen in outpatient counseling and medication management.  Patient will require outpatient appointment upon discharge.  Please contact social worker for out patient referrals. Please call [phone], if you have further questions.  Arlyce Harman  09/15/2013 1:39 PM

## 2013-09-15 NOTE — Progress Notes (Signed)
Dr. Sherral Hammers notified that pt is agitated, tearful, seems confused to life situation and states he wants to leave AMA--pt has also already received PO Ativan dose. Plan of care -- Give Ativan 1 mg IV and Dr. Sherral Hammers says he plans to call psychiatry. Connye Burkitt, RN

## 2013-09-15 NOTE — Progress Notes (Signed)
Pt much more calm and cooperative--still slightly paranoid but seems more oriented in general. He has agreed to stay in hospital for now. Pt also has $115.00 cash at bedside -- has adamantly refused several times to allow me to put it in security. Connye Burkitt, RN

## 2013-09-15 NOTE — Progress Notes (Signed)
Paged at approximately 1635 and informed by RN Annie Paras on the following; Patient requesting to leave AMA secondary to being agitated after girlfriend visited patient. At that time did not believe patient was competent, therefore contacted behavioral health. Dr.Syed Arfeen stated patient was competent and therefore had no grounds to hold patient from leaving AMA.   Contacted RN [employer] and informed her of the above, and was informed that Pt is no longer agitated, but is now insisting on leaving AMA. Pt verbalizes he understands the risks involved in leaving against the advice of his doctors. Pt is still slightly paranoid, but oriented to person, place, time and situation. Dr. Sherral Hammers notified. Connye Burkitt, RN

## 2013-09-15 NOTE — Progress Notes (Signed)
Notified Dr. Sherral Hammers that I spoke with pt's brother, Zubair Lofton, this morning. Monica Martinez states that he spoke with his brother and believes he's in a manic phase. Monica Martinez states that pt is telling him he's supposed to leave the hospital today to receive a liver transplant. During my assessment this morning, pt was talkative and spoke of needing to buy a car, being married, and owning a car business, all of which his brother states isn't true. Connye Burkitt, RN

## 2013-11-03 ENCOUNTER — Other Ambulatory Visit: Payer: Self-pay | Admitting: Internal Medicine

## 2013-11-17 ENCOUNTER — Emergency Department (HOSPITAL_COMMUNITY): Payer: Medicare PPO

## 2013-11-17 ENCOUNTER — Encounter (HOSPITAL_COMMUNITY): Payer: Self-pay | Admitting: Emergency Medicine

## 2013-11-17 ENCOUNTER — Emergency Department (HOSPITAL_COMMUNITY)
Admission: EM | Admit: 2013-11-17 | Discharge: 2013-11-17 | Disposition: A | Discharge status: Home / Self Care | Payer: Medicare PPO | Attending: Emergency Medicine | Admitting: Emergency Medicine

## 2013-11-17 DIAGNOSIS — K219 Gastro-esophageal reflux disease without esophagitis: Secondary | ICD-10-CM | POA: Diagnosis not present

## 2013-11-17 DIAGNOSIS — Z8673 Personal history of transient ischemic attack (TIA), and cerebral infarction without residual deficits: Secondary | ICD-10-CM | POA: Insufficient documentation

## 2013-11-17 DIAGNOSIS — R5381 Other malaise: Secondary | ICD-10-CM | POA: Insufficient documentation

## 2013-11-17 DIAGNOSIS — R079 Chest pain, unspecified: Secondary | ICD-10-CM | POA: Diagnosis not present

## 2013-11-17 DIAGNOSIS — R011 Cardiac murmur, unspecified: Secondary | ICD-10-CM | POA: Diagnosis not present

## 2013-11-17 DIAGNOSIS — M7989 Other specified soft tissue disorders: Secondary | ICD-10-CM | POA: Insufficient documentation

## 2013-11-17 DIAGNOSIS — Z760 Encounter for issue of repeat prescription: Secondary | ICD-10-CM | POA: Diagnosis not present

## 2013-11-17 DIAGNOSIS — G8929 Other chronic pain: Secondary | ICD-10-CM | POA: Insufficient documentation

## 2013-11-17 DIAGNOSIS — Z791 Long term (current) use of non-steroidal anti-inflammatories (NSAID): Secondary | ICD-10-CM | POA: Insufficient documentation

## 2013-11-17 DIAGNOSIS — I10 Essential (primary) hypertension: Secondary | ICD-10-CM | POA: Insufficient documentation

## 2013-11-17 DIAGNOSIS — J438 Other emphysema: Secondary | ICD-10-CM | POA: Insufficient documentation

## 2013-11-17 DIAGNOSIS — Z8669 Personal history of other diseases of the nervous system and sense organs: Secondary | ICD-10-CM | POA: Insufficient documentation

## 2013-11-17 DIAGNOSIS — E119 Type 2 diabetes mellitus without complications: Secondary | ICD-10-CM | POA: Insufficient documentation

## 2013-11-17 DIAGNOSIS — Z862 Personal history of diseases of the blood and blood-forming organs and certain disorders involving the immune mechanism: Secondary | ICD-10-CM | POA: Insufficient documentation

## 2013-11-17 DIAGNOSIS — F17200 Nicotine dependence, unspecified, uncomplicated: Secondary | ICD-10-CM | POA: Insufficient documentation

## 2013-11-17 DIAGNOSIS — R5383 Other fatigue: Secondary | ICD-10-CM

## 2013-11-17 DIAGNOSIS — Z79899 Other long term (current) drug therapy: Secondary | ICD-10-CM | POA: Diagnosis not present

## 2013-11-17 LAB — URINALYSIS, ROUTINE W REFLEX MICROSCOPIC
BILIRUBIN URINE: NEGATIVE
Glucose, UA: >1000 mg/dL — AB
Hgb urine dipstick: NEGATIVE
Ketones, ur: NEGATIVE mg/dL
LEUKOCYTES UA: NEGATIVE
NITRITE: NEGATIVE
PH: 5 (ref 5.0–8.0)
Protein, ur: NEGATIVE mg/dL
Specific Gravity, Urine: 1.037 — ABNORMAL HIGH (ref 1.005–1.030)
UROBILINOGEN UA: 1 mg/dL (ref 0.0–1.0)

## 2013-11-17 LAB — COMPREHENSIVE METABOLIC PANEL
ALBUMIN: 2.8 g/dL — AB (ref 3.5–5.2)
ALT: 21 U/L (ref 0–53)
AST: 30 U/L (ref 0–37)
Alkaline Phosphatase: 77 U/L (ref 39–117)
BILIRUBIN TOTAL: 2 mg/dL — AB (ref 0.3–1.2)
BUN: 10 mg/dL (ref 6–23)
CO2: 19 mEq/L (ref 19–32)
CREATININE: 0.94 mg/dL (ref 0.50–1.35)
Calcium: 8.4 mg/dL (ref 8.4–10.5)
Chloride: 95 mEq/L — ABNORMAL LOW (ref 96–112)
GFR calc non Af Amer: >90 mL/min (ref >90)
GLUCOSE: 578 mg/dL — AB (ref 70–99)
Potassium: 4.4 mEq/L (ref 3.7–5.3)
Sodium: 128 mEq/L — ABNORMAL LOW (ref 137–147)
Total Protein: 6.1 g/dL (ref 6.0–8.3)

## 2013-11-17 LAB — CBC WITH DIFFERENTIAL/PLATELET
Basophils Absolute: 0 10*3/uL (ref 0.0–0.1)
Basophils Relative: 0 % (ref 0–1)
EOS ABS: 0.3 10*3/uL (ref 0.0–0.7)
EOS PCT: 10 % — AB (ref 0–5)
HCT: 27.3 % — ABNORMAL LOW (ref 39.0–52.0)
Hemoglobin: 9.4 g/dL — ABNORMAL LOW (ref 13.0–17.0)
LYMPHS PCT: 25 % (ref 12–46)
Lymphs Abs: 0.7 10*3/uL (ref 0.7–4.0)
MCH: 32.5 pg (ref 26.0–34.0)
MCHC: 34.4 g/dL (ref 30.0–36.0)
MCV: 94.5 fL (ref 78.0–100.0)
MONO ABS: 0.3 10*3/uL (ref 0.1–1.0)
MONOS PCT: 11 % (ref 3–12)
NEUTROS PCT: 53 % (ref 43–77)
Neutro Abs: 1.5 10*3/uL — ABNORMAL LOW (ref 1.7–7.7)
PLATELETS: 45 10*3/uL — AB (ref 150–400)
RBC: 2.89 MIL/uL — ABNORMAL LOW (ref 4.22–5.81)
RDW: 15.8 % — AB (ref 11.5–15.5)
WBC: 2.8 10*3/uL — AB (ref 4.0–10.5)

## 2013-11-17 LAB — I-STAT TROPONIN, ED: Troponin i, poc: 0.01 ng/mL (ref 0.00–0.08)

## 2013-11-17 LAB — URINE MICROSCOPIC-ADD ON

## 2013-11-17 LAB — CBG MONITORING, ED
GLUCOSE-CAPILLARY: 286 mg/dL — AB (ref 70–99)
GLUCOSE-CAPILLARY: 244 mg/dL — AB (ref 70–99)
Glucose-Capillary: 298 mg/dL — ABNORMAL HIGH (ref 70–99)

## 2013-11-17 LAB — AMMONIA: Ammonia: 51 umol/L (ref 11–60)

## 2013-11-17 LAB — LIPASE, BLOOD: LIPASE: 83 U/L — AB (ref 11–59)

## 2013-11-17 MED ORDER — PROPRANOLOL HCL 20 MG PO TABS: 20.0000 mg | ORAL_TABLET | Freq: Two times a day (BID) | ORAL | Status: DC

## 2013-11-17 MED ORDER — SODIUM CHLORIDE 0.9 % IV BOLUS (SEPSIS)
1000.0000 mL | Freq: Once | INTRAVENOUS | Status: AC
  Administered 2013-11-17: 1000 mL via INTRAVENOUS

## 2013-11-17 MED ORDER — PANTOPRAZOLE SODIUM 40 MG PO TBEC: 40.0000 mg | DELAYED_RELEASE_TABLET | Freq: Every day | ORAL | Status: DC

## 2013-11-17 MED ORDER — RIFAXIMIN 550 MG PO TABS
550.0000 mg | ORAL_TABLET | Freq: Two times a day (BID) | ORAL | Status: DC
Start: 2013-11-17 — End: 2014-01-02

## 2013-11-17 MED ORDER — FOLIC ACID 1 MG PO TABS: 1.0000 mg | ORAL_TABLET | Freq: Every day | ORAL | Status: DC

## 2013-11-17 MED ORDER — INSULIN ASPART 100 UNIT/ML FLEXPEN: 6.0000 [IU] | PEN_INJECTOR | Freq: Three times a day (TID) | SUBCUTANEOUS | Status: DC | PRN

## 2013-11-17 MED ORDER — LACTULOSE 10 GM/15ML PO SOLN: 30.0000 g | Freq: Every day | ORAL | Status: DC

## 2013-11-17 MED ORDER — INSULIN ASPART 100 UNIT/ML ~~LOC~~ SOLN
10.0000 [IU] | Freq: Once | SUBCUTANEOUS | Status: AC
  Administered 2013-11-17: 10 [IU] via SUBCUTANEOUS
  Filled 2013-11-17: qty 1

## 2013-11-17 MED ORDER — LISINOPRIL 20 MG PO TABS: 20.0000 mg | ORAL_TABLET | Freq: Every day | ORAL | Status: DC

## 2013-11-17 MED ORDER — SPIRONOLACTONE 100 MG PO TABS: 100.0000 mg | ORAL_TABLET | Freq: Every day | ORAL | Status: DC

## 2013-11-17 MED ORDER — INSULIN GLARGINE 100 UNIT/ML SOLOSTAR PEN: 45.0000 [IU] | PEN_INJECTOR | Freq: Every day | SUBCUTANEOUS | Status: DC

## 2013-11-17 NOTE — ED Notes (Signed)
Patient asked for and received a Happy Meal and Diet Coke.

## 2013-11-17 NOTE — ED Notes (Signed)
EDP made aware of BP. 

## 2013-11-17 NOTE — ED Notes (Signed)
All tests completed.  Awaiting disposition.  Pt contacted [facility]; they are suppose to be calling him back.  Pt stated "maybe they can put me up in that [place], what's it called? [facility]?"  Encouraged pt to await return call, told him [facility] was for people with mental health problems.  Pt stated "I do have them."
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
## 2013-11-17 NOTE — ED Notes (Signed)
CBG Taken = 286

## 2013-11-17 NOTE — ED Notes (Signed)
Dr. Belfi at bedside 

## 2013-11-17 NOTE — ED Notes (Signed)
CRITICAL VALUE ALERT  Critical value received:  Glucose 570  Date of notification:  11/17/2013  Time of notification:  3976  Critical value read back:Yes.    Nurse who received alert:  MG Braulio Conte, RN  MD notified (1st page):  Dr. Betsey Holiday  Time of first page:  1444  MD notified (2nd page):  Time of second page:  Responding MD: Dr. Betsey Holiday  Time MD responded:  [phone]
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
## 2013-11-17 NOTE — ED Notes (Addendum)
CBG Taken = 244

## 2013-11-17 NOTE — ED Notes (Signed)
Tech removed IV which was clean,  dry and intact.

## 2013-11-17 NOTE — Discharge Instructions (Signed)
Medication Refill, Emergency Department We have refilled your medication today as a courtesy to you. It is best for your medical care, however, to take care of getting refills done through your primary caregiver's office. They have your records and can do a better job of follow-up than we can in the emergency department. On maintenance medications, we often only prescribe enough medications to get you by until you are able to see your regular caregiver. This is a more expensive way to refill medications. In the future, please plan for refills so that you will not have to use the emergency department for this. Thank you for your help. Your help allows [place] to better take care of the daily emergencies that enter our department. Document Released: 08/26/2003 Document Revised: 08/01/2011 Document Reviewed: 05/09/2005 [facility] Patient Information 2015 [place], [place]. This information is not intended to replace advice given to you by your health care provider. Make sure you discuss any questions you have with your health care provider.
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
## 2013-11-17 NOTE — ED Notes (Signed)
Pt was seen by [name] ED [facility], taking pills. Spoke with patient.  He has two prescription bottles with him, one for xanax and one for Norco.  Pt stated he took pills for his pain.  When asked about pain, he stated "you know, my muscle pain."  When asked where his pain was, he was unable to pinpoint it.  Pt was advised to not take any more medications without our knowledge.

## 2014-01-01 ENCOUNTER — Encounter (HOSPITAL_COMMUNITY): Payer: Self-pay | Admitting: Emergency Medicine

## 2014-01-01 ENCOUNTER — Emergency Department (HOSPITAL_COMMUNITY)
Admission: EM | Admit: 2014-01-01 | Discharge: 2014-01-02 | Disposition: A | Discharge status: Home / Self Care | Payer: Medicare PPO | Attending: Emergency Medicine | Admitting: Emergency Medicine

## 2014-01-01 DIAGNOSIS — I10 Essential (primary) hypertension: Secondary | ICD-10-CM | POA: Diagnosis not present

## 2014-01-01 DIAGNOSIS — Z8673 Personal history of transient ischemic attack (TIA), and cerebral infarction without residual deficits: Secondary | ICD-10-CM | POA: Insufficient documentation

## 2014-01-01 DIAGNOSIS — K219 Gastro-esophageal reflux disease without esophagitis: Secondary | ICD-10-CM | POA: Insufficient documentation

## 2014-01-01 DIAGNOSIS — Y9389 Activity, other specified: Secondary | ICD-10-CM | POA: Insufficient documentation

## 2014-01-01 DIAGNOSIS — E1149 Type 2 diabetes mellitus with other diabetic neurological complication: Secondary | ICD-10-CM | POA: Insufficient documentation

## 2014-01-01 DIAGNOSIS — E1142 Type 2 diabetes mellitus with diabetic polyneuropathy: Secondary | ICD-10-CM | POA: Diagnosis not present

## 2014-01-01 DIAGNOSIS — Z792 Long term (current) use of antibiotics: Secondary | ICD-10-CM | POA: Insufficient documentation

## 2014-01-01 DIAGNOSIS — F3130 Bipolar disorder, current episode depressed, mild or moderate severity, unspecified: Secondary | ICD-10-CM | POA: Diagnosis not present

## 2014-01-01 DIAGNOSIS — W108XXA Fall (on) (from) other stairs and steps, initial encounter: Secondary | ICD-10-CM | POA: Insufficient documentation

## 2014-01-01 DIAGNOSIS — Z862 Personal history of diseases of the blood and blood-forming organs and certain disorders involving the immune mechanism: Secondary | ICD-10-CM | POA: Insufficient documentation

## 2014-01-01 DIAGNOSIS — S139XXA Sprain of joints and ligaments of unspecified parts of neck, initial encounter: Secondary | ICD-10-CM | POA: Insufficient documentation

## 2014-01-01 DIAGNOSIS — J449 Chronic obstructive pulmonary disease, unspecified: Secondary | ICD-10-CM | POA: Diagnosis not present

## 2014-01-01 DIAGNOSIS — Z8739 Personal history of other diseases of the musculoskeletal system and connective tissue: Secondary | ICD-10-CM | POA: Diagnosis not present

## 2014-01-01 DIAGNOSIS — Y9289 Other specified places as the place of occurrence of the external cause: Secondary | ICD-10-CM | POA: Insufficient documentation

## 2014-01-01 DIAGNOSIS — S161XXA Strain of muscle, fascia and tendon at neck level, initial encounter: Secondary | ICD-10-CM

## 2014-01-01 DIAGNOSIS — F17200 Nicotine dependence, unspecified, uncomplicated: Secondary | ICD-10-CM | POA: Diagnosis not present

## 2014-01-01 DIAGNOSIS — G8929 Other chronic pain: Secondary | ICD-10-CM | POA: Diagnosis not present

## 2014-01-01 DIAGNOSIS — Z794 Long term (current) use of insulin: Secondary | ICD-10-CM | POA: Diagnosis not present

## 2014-01-01 DIAGNOSIS — J4489 Other specified chronic obstructive pulmonary disease: Secondary | ICD-10-CM | POA: Insufficient documentation

## 2014-01-01 DIAGNOSIS — S0990XA Unspecified injury of head, initial encounter: Secondary | ICD-10-CM | POA: Insufficient documentation

## 2014-01-01 MED ORDER — OXYCODONE-ACETAMINOPHEN 5-325 MG PO TABS
1.0000 | ORAL_TABLET | Freq: Once | ORAL | Status: AC
Start: 2014-01-01 — End: 2014-01-01
  Administered 2014-01-01: 1 via ORAL
  Filled 2014-01-01: qty 1
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
## 2014-01-02 ENCOUNTER — Encounter (HOSPITAL_COMMUNITY): Payer: Self-pay

## 2014-01-02 ENCOUNTER — Emergency Department (HOSPITAL_COMMUNITY): Payer: Medicare PPO

## 2014-01-02 MED ORDER — HYDROCODONE-ACETAMINOPHEN 5-325 MG PO TABS
2.0000 | ORAL_TABLET | Freq: Once | ORAL | Status: AC
  Administered 2014-01-02: 2 via ORAL
  Filled 2014-01-02: qty 2

## 2014-01-02 MED ORDER — HYDROCODONE-ACETAMINOPHEN 5-325 MG PO TABS: 1.0000 | ORAL_TABLET | ORAL | Status: DC | PRN

## 2014-01-02 NOTE — Discharge Instructions (Signed)
Hydrocodone as needed for pain.  Return to the ER if your symptoms significantly worsen or change.   Cervical Sprain A cervical sprain is an injury in the neck in which the strong, fibrous tissues (ligaments) that connect your neck bones stretch or tear. Cervical sprains can range from mild to severe. Severe cervical sprains can cause the neck vertebrae to be unstable. This can lead to damage of the spinal cord and can result in serious nervous system problems. The amount of time it takes for a cervical sprain to get better depends on the cause and extent of the injury. Most cervical sprains heal in 1 to 3 weeks. CAUSES  Severe cervical sprains may be caused by:   Contact sport injuries (such as from football, rugby, wrestling, hockey, auto racing, gymnastics, diving, martial arts, or boxing).   Motor vehicle collisions.   Whiplash injuries. This is an injury from a sudden forward and backward whipping movement of the head and neck.  Falls.  Mild cervical sprains may be caused by:   Being in an awkward position, such as while cradling a telephone between your ear and shoulder.   Sitting in a chair that does not offer proper support.   Working at a poorly Landscape architect station.   Looking up or down for long periods of time.  SYMPTOMS   Pain, soreness, stiffness, or a burning sensation in the front, back, or sides of the neck. This discomfort may develop immediately after the injury or slowly, 24 hours or more after the injury.   Pain or tenderness directly in the middle of the back of the neck.   Shoulder or upper back pain.   Limited ability to move the neck.   Headache.   Dizziness.   Weakness, numbness, or tingling in the hands or arms.   Muscle spasms.   Difficulty swallowing or chewing.   Tenderness and swelling of the neck.  DIAGNOSIS  Most of the time your health care provider can diagnose a cervical sprain by taking your history and doing a  physical exam. Your health care provider will ask about previous neck injuries and any known neck problems, such as arthritis in the neck. X-rays may be taken to find out if there are any other problems, such as with the bones of the neck. Other tests, such as a CT scan or MRI, may also be needed.  TREATMENT  Treatment depends on the severity of the cervical sprain. Mild sprains can be treated with rest, keeping the neck in place (immobilization), and pain medicines. Severe cervical sprains are immediately immobilized. Further treatment is done to help with pain, muscle spasms, and other symptoms and may include:  Medicines, such as pain relievers, numbing medicines, or muscle relaxants.   Physical therapy. This may involve stretching exercises, strengthening exercises, and posture training. Exercises and improved posture can help stabilize the neck, strengthen muscles, and help stop symptoms from returning.  HOME CARE INSTRUCTIONS   Put ice on the injured area.   Put ice in a plastic bag.   Place a towel between your skin and the bag.   Leave the ice on for 15-20 minutes, 3-4 times a day.   If your injury was severe, you may have been given a cervical collar to wear. A cervical collar is a two-piece collar designed to keep your neck from moving while it heals.  Do not remove the collar unless instructed by your health care provider.  If you have long hair, keep  it outside of the collar.  Ask your health care provider before making any adjustments to your collar. Minor adjustments may be required over time to improve comfort and reduce pressure on your chin or on the back of your head.  Ifyou are allowed to remove the collar for cleaning or bathing, follow your health care provider's instructions on how to do so safely.  Keep your collar clean by wiping it with mild soap and water and drying it completely. If the collar you have been given includes removable pads, remove them every  1-2 days and hand wash them with soap and water. Allow them to air dry. They should be completely dry before you wear them in the collar.  If you are allowed to remove the collar for cleaning and bathing, wash and dry the skin of your neck. Check your skin for irritation or sores. If you see any, tell your health care provider.  Do not drive while wearing the collar.   Only take over-the-counter or prescription medicines for pain, discomfort, or fever as directed by your health care provider.   Keep all follow-up appointments as directed by your health care provider.   Keep all physical therapy appointments as directed by your health care provider.   Make any needed adjustments to your workstation to promote good posture.   Avoid positions and activities that make your symptoms worse.   Warm up and stretch before being active to help prevent problems.  SEEK MEDICAL CARE IF:   Your pain is not controlled with medicine.   You are unable to decrease your pain medicine over time as planned.   Your activity level is not improving as expected.  SEEK IMMEDIATE MEDICAL CARE IF:   You develop any bleeding.  You develop stomach upset.  You have signs of an allergic reaction to your medicine.   Your symptoms get worse.   You develop new, unexplained symptoms.   You have numbness, tingling, weakness, or paralysis in any part of your body.  MAKE SURE YOU:   Understand these instructions.  Will watch your condition.  Will get help right away if you are not doing well or get worse. Document Released: 03/06/2007 Document Revised: 05/14/2013 Document Reviewed: 11/14/2012 [facility] Patient Information 2015 [place], [place]. This information is not intended to replace advice given to you by your health care provider. Make sure you discuss any questions you have with your health care provider.  Head Injury You have a head injury. Headaches and throwing up (vomiting) are common  after a head injury. It should be easy to wake up from sleeping. Sometimes you must stay in the hospital. Most problems happen within the first 24 hours. Side effects may occur up to 7-10 days after the injury.  WHAT ARE THE TYPES OF HEAD INJURIES? Head injuries can be as minor as a bump. Some head injuries can be more severe. More severe head injuries include:  A jarring injury to the brain (concussion).  A bruise of the brain (contusion). This mean there is bleeding in the brain that can cause swelling.  A cracked skull (skull fracture).  Bleeding in the brain that collects, clots, and forms a bump (hematoma). WHEN SHOULD I GET HELP RIGHT AWAY?   You are confused or sleepy.  You cannot be woken up.  You feel sick to your stomach (nauseous) or keep throwing up (vomiting).  Your dizziness or unsteadiness is getting worse.  You have very bad, lasting headaches that are not  helped by medicine. Take medicines only as told by your doctor.  You cannot use your arms or legs like normal.  You cannot walk.  You notice changes in the black spots in the center of the colored part of your eye (pupil).  You have clear or bloody fluid coming from your nose or ears.  You have trouble seeing. During the next 24 hours after the injury, you must stay with someone who can watch you. This person should get help right away (call 911 in the [place] if you start to shake and are not able to control it (have seizures), you pass out, or you are unable to wake up. HOW CAN I PREVENT A HEAD INJURY IN THE FUTURE?  Wear seat belts.  Wear a helmet while bike riding and playing sports like football.  Stay away from dangerous activities around the house. WHEN CAN I RETURN TO NORMAL ACTIVITIES AND ATHLETICS? See your doctor before doing these activities. You should not do normal activities or play contact sports until 1 week after the following symptoms have stopped:  Headache that does not go  away.  Dizziness.  Poor attention.  Confusion.  Memory problems.  Sickness to your stomach or throwing up.  Tiredness.  Fussiness.  Bothered by bright lights or loud noises.  Anxiousness or depression.  Restless sleep. MAKE SURE YOU:   Understand these instructions.  Will watch your condition.  Will get help right away if you are not doing well or get worse. Document Released: 04/21/2008 Document Revised: 09/23/2013 Document Reviewed: 01/14/2013 [facility] Patient Information 2015 [place], [place]. This information is not intended to replace advice given to you by your health care provider. Make sure you discuss any questions you have with your health care provider.

## 2014-01-02 NOTE — ED Notes (Signed)
Family at the nursing station requesting update.  Reported to Dr. Stark Jock, he is now at the bedside.

## 2014-01-02 NOTE — ED Provider Notes (Signed)
CSN: [id number]     Arrival date & time 01/01/14  2038 History   First MD Initiated Contact with Patient 01/02/14 0026     Chief Complaint  Patient presents with  . Fall     (Consider location/radiation/quality/duration/timing/severity/associated sxs/prior Treatment) HPI Comments: Patient is a 58 year old male with history of diabetes and diabetic neuropathy. Presents for evaluation of headache and neck pain. He states that he stumbled and fell several days ago and his head.   Patient is a 58 y.o. male presenting with fall. The history is provided by the patient.  Fall This is a new problem. Episode onset: 3 days ago. The problem occurs constantly. The problem has not changed since onset.Associated symptoms include headaches. Pertinent negatives include no chest pain. Nothing aggravates the symptoms. Nothing relieves the symptoms. He has tried nothing for the symptoms. The treatment provided no relief.    Past Medical History  Diagnosis Date  . Neuropathy   . Diabetes mellitus   . Bipolar affect, depressed   . Hypertension   . Arthritis   . Stroke     Mini stroke about 42yrs ago  . Cirrhosis   . Alcohol abuse   . Chronic pain   . Cocaine abuse   . Muscle spasm     both legs  . Encephalopathy, hepatic   . Detached retina   . COPD (chronic obstructive pulmonary disease)     emphysema  . Bronchitis   . Barrett's esophagus   . GERD (gastroesophageal reflux disease)     has ulcer  . Anemia    Past Surgical History  Procedure Laterality Date  . Fracture surgery      Leg and arm 110yrs ago  . Esophagogastroduodenoscopy  04/04/2012    Procedure: ESOPHAGOGASTRODUODENOSCOPY (EGD);  Surgeon: Irene Shipper, MD;  Location: [facility] ENDOSCOPY;  Service: Endoscopy;  Laterality: N/A;  . Esophagogastroduodenoscopy Left 03/13/2013    Procedure: ESOPHAGOGASTRODUODENOSCOPY (EGD);  Surgeon: Arta Silence, MD;  Location: [facility] ENDOSCOPY;  Service: Endoscopy;  Laterality: Left;  Marland Kitchen Eye surgery  8  months ago both eyes    cataracts both eyes, detached eye, gas pocket  . Vasectomy    . Pars plana vitrectomy Left 07/08/2013    Procedure: PARS PLANA VITRECTOMY WITH 25 GAUGE;  Surgeon: Hurman Horn, MD;  Location: [place];  Service: Ophthalmology;  Laterality: Left;  . Intraocular lens removal Left 07/08/2013    Procedure: REMOVAL OF INTRAOCULAR LENS;  Surgeon: Hurman Horn, MD;  Location: [place];  Service: Ophthalmology;  Laterality: Left;  . Placement and suture of secondary intraocular lens Left 07/08/2013    Procedure: PLACEMENT AND SUTURE OF SECONDARY INTRAOCULAR LENS;  Surgeon: Hurman Horn, MD;  Location: [place];  Service: Ophthalmology;  Laterality: Left;  Insertion of Anterior Capsule Intraocular Lens    Family History  Problem Relation Age of Onset  . Hypotension Mother    History  Substance Use Topics  . Smoking status: Current Every Day Smoker -- 1.00 packs/day for 30 years    Types: Cigarettes    Last Attempt to Quit: 04/06/2012  . Smokeless tobacco: Never Used     Comment: quit   . Alcohol Use: 0.0 oz/week     Comment: 12 pk beer daily  06/2013 - no alcohol since 11/2012    Review of Systems  Cardiovascular: Negative for chest pain.  Neurological: Positive for headaches.  All other systems reviewed and are negative.     Allergies  Review of patient's  allergies indicates no known allergies.  Home Medications   Prior to Admission medications   Medication Sig Start Date End Date Taking? Authorizing Provider  ALPRAZolam Duanne Moron) 1 MG tablet Take 1 mg by mouth 2 (two) times daily as needed for anxiety.   Yes Historical Provider, MD  insulin aspart (NOVOLOG) 100 UNIT/ML FlexPen Inject 6 Units into the skin 3 (three) times daily as needed for high blood sugar. 11/17/13  Yes Malvin Johns, MD  Insulin Glargine (LANTUS SOLOSTAR) 100 UNIT/ML Solostar Pen Inject 45 Units into the skin at bedtime. 11/17/13  Yes Malvin Johns, MD  lactulose (CHRONULAC) 10 GM/15ML solution Take 45  mLs (30 g total) by mouth daily. Titrate to at least 3 BMs daily 11/17/13  Yes Malvin Johns, MD  lisinopril (PRINIVIL,ZESTRIL) 20 MG tablet Take 20 mg by mouth daily.   Yes Historical Provider, MD  pantoprazole (PROTONIX) 40 MG tablet Take 40 mg by mouth daily.   Yes Historical Provider, MD  propranolol (INDERAL) 40 MG tablet Take 40 mg by mouth 2 (two) times daily.   Yes Historical Provider, MD  rifaximin (XIFAXAN) 550 MG TABS tablet Take 550 mg by mouth 2 (two) times daily.   Yes Historical Provider, MD   BP 135/59  Pulse 78  Temp(Src) 98.2 F (36.8 C) (Oral)  Resp 18  Ht 5\' 10"  (1.778 m)  Wt 215 lb (97.523 kg)  BMI 30.85 kg/m2  SpO2 98% Physical Exam  Nursing note and vitals reviewed. Constitutional: He is oriented to person, place, and time. He appears well-developed and well-nourished. No distress.  HENT:  Head: Normocephalic and atraumatic.  Mouth/Throat: Oropharynx is clear and moist.  Eyes: EOM are normal. Pupils are equal, round, and reactive to light.  Neck: Normal range of motion. Neck supple.  Cardiovascular: Normal rate, regular rhythm and normal heart sounds.   No murmur heard. Pulmonary/Chest: Effort normal and breath sounds normal. No respiratory distress. He has no wheezes.  Abdominal: Soft. Bowel sounds are normal.  Musculoskeletal: Normal range of motion. He exhibits no edema.  Neurological: He is alert and oriented to person, place, and time. No cranial nerve deficit. He exhibits normal muscle tone. Coordination normal.  Skin: Skin is warm and dry. He is not diaphoretic.    ED Course  Procedures (including critical care time) Labs Review Labs Reviewed - No data to display  Imaging Review Ct Head Wo Contrast  01/02/2014   CLINICAL DATA:  History of trauma from a fall with injury to the left eyebrow.  EXAM: CT HEAD WITHOUT CONTRAST  CT CERVICAL SPINE WITHOUT CONTRAST  TECHNIQUE: Multidetector CT imaging of the head and cervical spine was performed following  the standard protocol without intravenous contrast. Multiplanar CT image reconstructions of the cervical spine were also generated.  COMPARISON:  Head CT 09/12/2013.  FINDINGS: CT HEAD FINDINGS  Mild cerebral atrophy. Patchy and confluent areas of decreased attenuation are noted throughout the deep and periventricular white matter of the cerebral hemispheres bilaterally, compatible with chronic microvascular ischemic disease. No acute intracranial abnormalities. Specifically, no evidence of acute intracranial hemorrhage, no definite findings of acute/subacute cerebral ischemia, no mass, mass effect, hydrocephalus or abnormal intra or extra-axial fluid collections. Visualized paranasal sinuses and mastoids are well pneumatized. No acute displaced skull fractures are identified.  CT CERVICAL SPINE FINDINGS  No acute displaced fractures of the cervical spine. Alignment is anatomic. Prevertebral soft tissues are normal. Visualized portions of the upper thorax are unremarkable.  IMPRESSION: 1. No evidence of significant acute  traumatic injury to the skull, brain or cervical spine. 2. Mild cerebral atrophy with mild chronic ischemic changes throughout the cerebral white matter.   Electronically Signed   By: Vinnie Langton M.D.   On: 01/02/2014 01:51   Ct Cervical Spine Wo Contrast  01/02/2014   CLINICAL DATA:  History of trauma from a fall with injury to the left eyebrow.  EXAM: CT HEAD WITHOUT CONTRAST  CT CERVICAL SPINE WITHOUT CONTRAST  TECHNIQUE: Multidetector CT imaging of the head and cervical spine was performed following the standard protocol without intravenous contrast. Multiplanar CT image reconstructions of the cervical spine were also generated.  COMPARISON:  Head CT 09/12/2013.  FINDINGS: CT HEAD FINDINGS  Mild cerebral atrophy. Patchy and confluent areas of decreased attenuation are noted throughout the deep and periventricular white matter of the cerebral hemispheres bilaterally, compatible with  chronic microvascular ischemic disease. No acute intracranial abnormalities. Specifically, no evidence of acute intracranial hemorrhage, no definite findings of acute/subacute cerebral ischemia, no mass, mass effect, hydrocephalus or abnormal intra or extra-axial fluid collections. Visualized paranasal sinuses and mastoids are well pneumatized. No acute displaced skull fractures are identified.  CT CERVICAL SPINE FINDINGS  No acute displaced fractures of the cervical spine. Alignment is anatomic. Prevertebral soft tissues are normal. Visualized portions of the upper thorax are unremarkable.  IMPRESSION: 1. No evidence of significant acute traumatic injury to the skull, brain or cervical spine. 2. Mild cerebral atrophy with mild chronic ischemic changes throughout the cerebral white matter.   Electronically Signed   By: Vinnie Langton M.D.   On: 01/02/2014 01:51     EKG Interpretation None      MDM   Final diagnoses:  None    Patient presents here after a fall. Exam of pain in the head and neck, however CT of the head and cervical spine are negative. He will be discharged with pain medication and when necessary followup.    Veryl Speak, MD 01/02/14 [phone]

## 2014-01-13 ENCOUNTER — Encounter (HOSPITAL_COMMUNITY): Payer: Self-pay | Admitting: Emergency Medicine

## 2014-01-13 ENCOUNTER — Inpatient Hospital Stay (HOSPITAL_COMMUNITY)
Admission: EM | Admit: 2014-01-13 | Discharge: 2014-01-18 | DRG: 377 | Disposition: A | Discharge status: Home Health | Payer: Medicare PPO | Attending: Internal Medicine | Admitting: Internal Medicine

## 2014-01-13 DIAGNOSIS — I8510 Secondary esophageal varices without bleeding: Secondary | ICD-10-CM | POA: Diagnosis present

## 2014-01-13 DIAGNOSIS — G9340 Encephalopathy, unspecified: Secondary | ICD-10-CM

## 2014-01-13 DIAGNOSIS — K766 Portal hypertension: Secondary | ICD-10-CM | POA: Diagnosis present

## 2014-01-13 DIAGNOSIS — E875 Hyperkalemia: Secondary | ICD-10-CM

## 2014-01-13 DIAGNOSIS — F1410 Cocaine abuse, uncomplicated: Secondary | ICD-10-CM | POA: Diagnosis present

## 2014-01-13 DIAGNOSIS — Z79899 Other long term (current) drug therapy: Secondary | ICD-10-CM

## 2014-01-13 DIAGNOSIS — L03119 Cellulitis of unspecified part of limb: Secondary | ICD-10-CM

## 2014-01-13 DIAGNOSIS — D649 Anemia, unspecified: Secondary | ICD-10-CM

## 2014-01-13 DIAGNOSIS — E7220 Disorder of urea cycle metabolism, unspecified: Secondary | ICD-10-CM

## 2014-01-13 DIAGNOSIS — Z8673 Personal history of transient ischemic attack (TIA), and cerebral infarction without residual deficits: Secondary | ICD-10-CM

## 2014-01-13 DIAGNOSIS — Z794 Long term (current) use of insulin: Secondary | ICD-10-CM

## 2014-01-13 DIAGNOSIS — F1021 Alcohol dependence, in remission: Secondary | ICD-10-CM

## 2014-01-13 DIAGNOSIS — E872 Acidosis, unspecified: Secondary | ICD-10-CM

## 2014-01-13 DIAGNOSIS — E162 Hypoglycemia, unspecified: Secondary | ICD-10-CM

## 2014-01-13 DIAGNOSIS — R5381 Other malaise: Secondary | ICD-10-CM | POA: Diagnosis not present

## 2014-01-13 DIAGNOSIS — I959 Hypotension, unspecified: Secondary | ICD-10-CM

## 2014-01-13 DIAGNOSIS — F329 Major depressive disorder, single episode, unspecified: Secondary | ICD-10-CM

## 2014-01-13 DIAGNOSIS — K921 Melena: Secondary | ICD-10-CM

## 2014-01-13 DIAGNOSIS — D61818 Other pancytopenia: Secondary | ICD-10-CM

## 2014-01-13 DIAGNOSIS — F1010 Alcohol abuse, uncomplicated: Secondary | ICD-10-CM

## 2014-01-13 DIAGNOSIS — N179 Acute kidney failure, unspecified: Secondary | ICD-10-CM

## 2014-01-13 DIAGNOSIS — E860 Dehydration: Secondary | ICD-10-CM | POA: Diagnosis present

## 2014-01-13 DIAGNOSIS — K922 Gastrointestinal hemorrhage, unspecified: Secondary | ICD-10-CM | POA: Diagnosis not present

## 2014-01-13 DIAGNOSIS — E669 Obesity, unspecified: Secondary | ICD-10-CM | POA: Diagnosis present

## 2014-01-13 DIAGNOSIS — K7031 Alcoholic cirrhosis of liver with ascites: Secondary | ICD-10-CM

## 2014-01-13 DIAGNOSIS — F32A Depression, unspecified: Secondary | ICD-10-CM

## 2014-01-13 DIAGNOSIS — K7210 Chronic hepatic failure without coma: Secondary | ICD-10-CM

## 2014-01-13 DIAGNOSIS — D62 Acute posthemorrhagic anemia: Secondary | ICD-10-CM

## 2014-01-13 DIAGNOSIS — K7682 Hepatic encephalopathy: Secondary | ICD-10-CM

## 2014-01-13 DIAGNOSIS — R531 Weakness: Secondary | ICD-10-CM

## 2014-01-13 DIAGNOSIS — D696 Thrombocytopenia, unspecified: Secondary | ICD-10-CM

## 2014-01-13 DIAGNOSIS — W19XXXS Unspecified fall, sequela: Secondary | ICD-10-CM

## 2014-01-13 DIAGNOSIS — K319 Disease of stomach and duodenum, unspecified: Secondary | ICD-10-CM | POA: Diagnosis present

## 2014-01-13 DIAGNOSIS — K7030 Alcoholic cirrhosis of liver without ascites: Secondary | ICD-10-CM | POA: Diagnosis present

## 2014-01-13 DIAGNOSIS — Z6835 Body mass index (BMI) 35.0-35.9, adult: Secondary | ICD-10-CM

## 2014-01-13 DIAGNOSIS — K5520 Angiodysplasia of colon without hemorrhage: Secondary | ICD-10-CM | POA: Diagnosis present

## 2014-01-13 DIAGNOSIS — E119 Type 2 diabetes mellitus without complications: Secondary | ICD-10-CM

## 2014-01-13 DIAGNOSIS — I10 Essential (primary) hypertension: Secondary | ICD-10-CM

## 2014-01-13 DIAGNOSIS — Z720 Tobacco use: Secondary | ICD-10-CM

## 2014-01-13 DIAGNOSIS — J438 Other emphysema: Secondary | ICD-10-CM | POA: Diagnosis present

## 2014-01-13 DIAGNOSIS — R062 Wheezing: Secondary | ICD-10-CM | POA: Diagnosis present

## 2014-01-13 DIAGNOSIS — K7290 Hepatic failure, unspecified without coma: Secondary | ICD-10-CM | POA: Diagnosis present

## 2014-01-13 MED ORDER — SODIUM CHLORIDE 0.9 % IV BOLUS (SEPSIS): 1000.0000 mL | Freq: Once | INTRAVENOUS | Status: AC

## 2014-01-13 MED ORDER — ONDANSETRON HCL 4 MG/2ML IJ SOLN
4.0000 mg | Freq: Once | INTRAMUSCULAR | Status: AC
  Administered 2014-01-14: 4 mg via INTRAVENOUS
  Filled 2014-01-13: qty 2

## 2014-01-13 MED ORDER — SODIUM CHLORIDE 0.9 % IV SOLN
Freq: Once | INTRAVENOUS | Status: AC
  Administered 2014-01-14: 1000 mL via INTRAVENOUS

## 2014-01-13 MED ORDER — SODIUM CHLORIDE 0.9 % IV SOLN
8.0000 mg/h | INTRAVENOUS | Status: AC
  Administered 2014-01-14 – 2014-01-16 (×6): 8 mg/h via INTRAVENOUS
  Filled 2014-01-13 (×14): qty 80

## 2014-01-13 MED ORDER — DEXTROSE 5 % IV SOLN
1.0000 g | Freq: Once | INTRAVENOUS | Status: AC
  Administered 2014-01-14: 1 g via INTRAVENOUS
  Filled 2014-01-13: qty 10

## 2014-01-13 MED ORDER — OCTREOTIDE LOAD VIA INFUSION: 50.0000 ug | Freq: Once | INTRAVENOUS | Status: DC

## 2014-01-13 MED ORDER — SODIUM CHLORIDE 0.9 % IV SOLN
80.0000 mg | Freq: Once | INTRAVENOUS | Status: AC
  Administered 2014-01-14: 80 mg via INTRAVENOUS
  Filled 2014-01-13: qty 80

## 2014-01-13 NOTE — ED Notes (Signed)
Per Family: Pt has history of cirrosis of the liver and bleeding ulcers. Pt vomiting bright red blood, pt is alert but is quite lethargic to answer questions. NAD at this time.

## 2014-01-14 ENCOUNTER — Inpatient Hospital Stay (HOSPITAL_COMMUNITY): Payer: Medicare PPO

## 2014-01-14 DIAGNOSIS — F1410 Cocaine abuse, uncomplicated: Secondary | ICD-10-CM | POA: Diagnosis present

## 2014-01-14 DIAGNOSIS — E669 Obesity, unspecified: Secondary | ICD-10-CM | POA: Diagnosis present

## 2014-01-14 DIAGNOSIS — K922 Gastrointestinal hemorrhage, unspecified: Principal | ICD-10-CM

## 2014-01-14 DIAGNOSIS — Z79899 Other long term (current) drug therapy: Secondary | ICD-10-CM | POA: Diagnosis not present

## 2014-01-14 DIAGNOSIS — D62 Acute posthemorrhagic anemia: Secondary | ICD-10-CM | POA: Diagnosis present

## 2014-01-14 DIAGNOSIS — E860 Dehydration: Secondary | ICD-10-CM | POA: Diagnosis present

## 2014-01-14 DIAGNOSIS — D696 Thrombocytopenia, unspecified: Secondary | ICD-10-CM

## 2014-01-14 DIAGNOSIS — K319 Disease of stomach and duodenum, unspecified: Secondary | ICD-10-CM | POA: Diagnosis present

## 2014-01-14 DIAGNOSIS — N179 Acute kidney failure, unspecified: Secondary | ICD-10-CM

## 2014-01-14 DIAGNOSIS — E119 Type 2 diabetes mellitus without complications: Secondary | ICD-10-CM

## 2014-01-14 DIAGNOSIS — R5381 Other malaise: Secondary | ICD-10-CM | POA: Diagnosis present

## 2014-01-14 DIAGNOSIS — I8510 Secondary esophageal varices without bleeding: Secondary | ICD-10-CM | POA: Diagnosis present

## 2014-01-14 DIAGNOSIS — K7030 Alcoholic cirrhosis of liver without ascites: Secondary | ICD-10-CM

## 2014-01-14 DIAGNOSIS — J438 Other emphysema: Secondary | ICD-10-CM | POA: Diagnosis present

## 2014-01-14 DIAGNOSIS — K7682 Hepatic encephalopathy: Secondary | ICD-10-CM | POA: Diagnosis present

## 2014-01-14 DIAGNOSIS — R5383 Other fatigue: Secondary | ICD-10-CM | POA: Diagnosis present

## 2014-01-14 DIAGNOSIS — I959 Hypotension, unspecified: Secondary | ICD-10-CM

## 2014-01-14 DIAGNOSIS — D649 Anemia, unspecified: Secondary | ICD-10-CM

## 2014-01-14 DIAGNOSIS — G9340 Encephalopathy, unspecified: Secondary | ICD-10-CM

## 2014-01-14 DIAGNOSIS — F1010 Alcohol abuse, uncomplicated: Secondary | ICD-10-CM

## 2014-01-14 DIAGNOSIS — Z794 Long term (current) use of insulin: Secondary | ICD-10-CM | POA: Diagnosis not present

## 2014-01-14 DIAGNOSIS — K7290 Hepatic failure, unspecified without coma: Secondary | ICD-10-CM | POA: Diagnosis present

## 2014-01-14 DIAGNOSIS — E875 Hyperkalemia: Secondary | ICD-10-CM

## 2014-01-14 DIAGNOSIS — R062 Wheezing: Secondary | ICD-10-CM | POA: Diagnosis present

## 2014-01-14 DIAGNOSIS — Z6835 Body mass index (BMI) 35.0-35.9, adult: Secondary | ICD-10-CM | POA: Diagnosis not present

## 2014-01-14 DIAGNOSIS — E7220 Disorder of urea cycle metabolism, unspecified: Secondary | ICD-10-CM | POA: Diagnosis present

## 2014-01-14 DIAGNOSIS — K5520 Angiodysplasia of colon without hemorrhage: Secondary | ICD-10-CM | POA: Diagnosis present

## 2014-01-14 DIAGNOSIS — Z8673 Personal history of transient ischemic attack (TIA), and cerebral infarction without residual deficits: Secondary | ICD-10-CM | POA: Diagnosis not present

## 2014-01-14 DIAGNOSIS — K766 Portal hypertension: Secondary | ICD-10-CM | POA: Diagnosis present

## 2014-01-14 LAB — GLUCOSE, CAPILLARY
Glucose-Capillary: 196 mg/dL — ABNORMAL HIGH (ref 70–99)
Glucose-Capillary: 157 mg/dL — ABNORMAL HIGH (ref 70–99)

## 2014-01-14 LAB — BASIC METABOLIC PANEL
Anion gap: 13 (ref 5–15)
BUN: 50 mg/dL — ABNORMAL HIGH (ref 6–23)
CO2: 17 mEq/L — ABNORMAL LOW (ref 19–32)
CREATININE: 2.02 mg/dL — AB (ref 0.50–1.35)
Calcium: 7.5 mg/dL — ABNORMAL LOW (ref 8.4–10.5)
Chloride: 106 mEq/L (ref 96–112)
GFR, EST AFRICAN AMERICAN: 40 mL/min — AB (ref >90)
GFR, EST NON AFRICAN AMERICAN: 35 mL/min — AB (ref >90)
GLUCOSE: 221 mg/dL — AB (ref 70–99)
Potassium: 5.9 mEq/L — ABNORMAL HIGH (ref 3.7–5.3)
Sodium: 136 mEq/L — ABNORMAL LOW (ref 137–147)

## 2014-01-14 LAB — CBC
HCT: 20.9 % — ABNORMAL LOW (ref 39.0–52.0)
HEMATOCRIT: 22.8 % — AB (ref 39.0–52.0)
HEMOGLOBIN: 7.1 g/dL — AB (ref 13.0–17.0)
Hemoglobin: 7.9 g/dL — ABNORMAL LOW (ref 13.0–17.0)
MCH: 31.3 pg (ref 26.0–34.0)
MCH: 32.3 pg (ref 26.0–34.0)
MCHC: 34 g/dL (ref 30.0–36.0)
MCHC: 34.6 g/dL (ref 30.0–36.0)
MCV: 90.5 fL (ref 78.0–100.0)
MCV: 95 fL (ref 78.0–100.0)
PLATELETS: 55 10*3/uL — AB (ref 150–400)
PLATELETS: 97 10*3/uL — AB (ref 150–400)
RBC: 2.2 MIL/uL — AB (ref 4.22–5.81)
RBC: 2.52 MIL/uL — ABNORMAL LOW (ref 4.22–5.81)
RDW: 16.2 % — ABNORMAL HIGH (ref 11.5–15.5)
RDW: 17.5 % — AB (ref 11.5–15.5)
WBC: 10.3 10*3/uL (ref 4.0–10.5)
WBC: 7.2 10*3/uL (ref 4.0–10.5)

## 2014-01-14 LAB — URINALYSIS, ROUTINE W REFLEX MICROSCOPIC
Bilirubin Urine: NEGATIVE
GLUCOSE, UA: NEGATIVE mg/dL
HGB URINE DIPSTICK: NEGATIVE
Ketones, ur: 15 mg/dL — AB
LEUKOCYTES UA: NEGATIVE
Nitrite: NEGATIVE
PH: 5 (ref 5.0–8.0)
PROTEIN: NEGATIVE mg/dL
Specific Gravity, Urine: 1.018 (ref 1.005–1.030)
Urobilinogen, UA: 0.2 mg/dL (ref 0.0–1.0)

## 2014-01-14 LAB — COMPREHENSIVE METABOLIC PANEL
ALK PHOS: 67 U/L (ref 39–117)
ALT: 27 U/L (ref 0–53)
ALT: 37 U/L (ref 0–53)
ANION GAP: 12 (ref 5–15)
AST: 66 U/L — ABNORMAL HIGH (ref 0–37)
AST: 90 U/L — ABNORMAL HIGH (ref 0–37)
Albumin: 2.4 g/dL — ABNORMAL LOW (ref 3.5–5.2)
Albumin: 2.4 g/dL — ABNORMAL LOW (ref 3.5–5.2)
Alkaline Phosphatase: 64 U/L (ref 39–117)
Anion gap: 19 — ABNORMAL HIGH (ref 5–15)
BILIRUBIN TOTAL: 2 mg/dL — AB (ref 0.3–1.2)
BUN: 39 mg/dL — ABNORMAL HIGH (ref 6–23)
BUN: 47 mg/dL — ABNORMAL HIGH (ref 6–23)
CALCIUM: 7.4 mg/dL — AB (ref 8.4–10.5)
CHLORIDE: 102 meq/L (ref 96–112)
CO2: 16 meq/L — AB (ref 19–32)
CO2: 18 mEq/L — ABNORMAL LOW (ref 19–32)
CREATININE: 1.91 mg/dL — AB (ref 0.50–1.35)
Calcium: 8.7 mg/dL (ref 8.4–10.5)
Chloride: 105 mEq/L (ref 96–112)
Creatinine, Ser: 1.7 mg/dL — ABNORMAL HIGH (ref 0.50–1.35)
GFR calc Af Amer: 49 mL/min — ABNORMAL LOW (ref >90)
GFR calc non Af Amer: 37 mL/min — ABNORMAL LOW (ref >90)
GFR, EST AFRICAN AMERICAN: 43 mL/min — AB (ref >90)
GFR, EST NON AFRICAN AMERICAN: 43 mL/min — AB (ref >90)
Glucose, Bld: 131 mg/dL — ABNORMAL HIGH (ref 70–99)
Glucose, Bld: 166 mg/dL — ABNORMAL HIGH (ref 70–99)
Potassium: 6.4 mEq/L — ABNORMAL HIGH (ref 3.7–5.3)
Potassium: 6.7 mEq/L (ref 3.7–5.3)
SODIUM: 137 meq/L (ref 137–147)
Sodium: 135 mEq/L — ABNORMAL LOW (ref 137–147)
Total Bilirubin: 2.3 mg/dL — ABNORMAL HIGH (ref 0.3–1.2)
Total Protein: 5.1 g/dL — ABNORMAL LOW (ref 6.0–8.3)
Total Protein: 5.4 g/dL — ABNORMAL LOW (ref 6.0–8.3)

## 2014-01-14 LAB — CBC WITH DIFFERENTIAL/PLATELET
Basophils Absolute: 0 10*3/uL (ref 0.0–0.1)
Basophils Relative: 0 % (ref 0–1)
EOS ABS: 0 10*3/uL (ref 0.0–0.7)
Eosinophils Relative: 0 % (ref 0–5)
HEMATOCRIT: 24.5 % — AB (ref 39.0–52.0)
HEMOGLOBIN: 8 g/dL — AB (ref 13.0–17.0)
Lymphocytes Relative: 11 % — ABNORMAL LOW (ref 12–46)
Lymphs Abs: 0.6 10*3/uL — ABNORMAL LOW (ref 0.7–4.0)
MCH: 30.2 pg (ref 26.0–34.0)
MCHC: 32.7 g/dL (ref 30.0–36.0)
MCV: 92.5 fL (ref 78.0–100.0)
MONO ABS: 0.1 10*3/uL (ref 0.1–1.0)
MONOS PCT: 1 % — AB (ref 3–12)
Neutro Abs: 4.7 10*3/uL (ref 1.7–7.7)
Neutrophils Relative %: 88 % — ABNORMAL HIGH (ref 43–77)
Platelets: 56 10*3/uL — ABNORMAL LOW (ref 150–400)
RBC: 2.65 MIL/uL — ABNORMAL LOW (ref 4.22–5.81)
RDW: 17.5 % — ABNORMAL HIGH (ref 11.5–15.5)
WBC: 5.3 10*3/uL (ref 4.0–10.5)

## 2014-01-14 LAB — CBG MONITORING, ED
Glucose-Capillary: 131 mg/dL — ABNORMAL HIGH (ref 70–99)
Glucose-Capillary: 197 mg/dL — ABNORMAL HIGH (ref 70–99)
Glucose-Capillary: 186 mg/dL — ABNORMAL HIGH (ref 70–99)

## 2014-01-14 LAB — PROTIME-INR
INR: 1.66 — ABNORMAL HIGH (ref 0.00–1.49)
INR: 1.73 — ABNORMAL HIGH (ref 0.00–1.49)
PROTHROMBIN TIME: 19.6 s — AB (ref 11.6–15.2)
Prothrombin Time: 20.3 seconds — ABNORMAL HIGH (ref 11.6–15.2)

## 2014-01-14 LAB — AMMONIA: AMMONIA: 102 umol/L — AB (ref 11–60)

## 2014-01-14 LAB — MRSA PCR SCREENING: MRSA BY PCR: NEGATIVE

## 2014-01-14 LAB — POTASSIUM
Potassium: 6.1 mEq/L — ABNORMAL HIGH (ref 3.7–5.3)
Potassium: 5.8 mEq/L — ABNORMAL HIGH (ref 3.7–5.3)

## 2014-01-14 LAB — GLUCOSE, RANDOM: Glucose, Bld: 198 mg/dL — ABNORMAL HIGH (ref 70–99)

## 2014-01-14 LAB — HEMOGLOBIN A1C
Hgb A1c MFr Bld: 6.4 % — ABNORMAL HIGH (ref <5.7)
Mean Plasma Glucose: 137 mg/dL — ABNORMAL HIGH (ref <117)

## 2014-01-14 LAB — APTT: aPTT: 40 seconds — ABNORMAL HIGH (ref 24–37)

## 2014-01-14 LAB — PREPARE RBC (CROSSMATCH)

## 2014-01-14 MED ORDER — INSULIN ASPART 100 UNIT/ML IV SOLN
10.0000 [IU] | Freq: Once | INTRAVENOUS | Status: AC
  Administered 2014-01-14: 10 [IU] via INTRAVENOUS
  Filled 2014-01-14: qty 0.1

## 2014-01-14 MED ORDER — ONDANSETRON HCL 4 MG/2ML IJ SOLN: 4.0000 mg | Freq: Four times a day (QID) | INTRAMUSCULAR | Status: DC | PRN

## 2014-01-14 MED ORDER — LACTULOSE 10 GM/15ML PO SOLN
30.0000 g | Freq: Two times a day (BID) | ORAL | Status: DC
  Administered 2014-01-14 – 2014-01-15 (×3): 30 g via ORAL
  Filled 2014-01-14 (×5): qty 45

## 2014-01-14 MED ORDER — ONDANSETRON HCL 4 MG PO TABS: 4.0000 mg | ORAL_TABLET | Freq: Four times a day (QID) | ORAL | Status: DC | PRN

## 2014-01-14 MED ORDER — ALBUTEROL (5 MG/ML) CONTINUOUS INHALATION SOLN
10.0000 mg/h | INHALATION_SOLUTION | RESPIRATORY_TRACT | Status: DC
  Administered 2014-01-14: 10 mg/h via RESPIRATORY_TRACT

## 2014-01-14 MED ORDER — IPRATROPIUM-ALBUTEROL 0.5-2.5 (3) MG/3ML IN SOLN
3.0000 mL | RESPIRATORY_TRACT | Status: DC | PRN
  Administered 2014-01-14 – 2014-01-18 (×4): 3 mL via RESPIRATORY_TRACT
  Filled 2014-01-14 (×5): qty 3

## 2014-01-14 MED ORDER — MORPHINE SULFATE 4 MG/ML IJ SOLN
4.0000 mg | INTRAMUSCULAR | Status: DC | PRN
  Administered 2014-01-15: 4 mg via INTRAVENOUS
  Filled 2014-01-14: qty 1

## 2014-01-14 MED ORDER — SODIUM CHLORIDE 0.9 % IJ SOLN
3.0000 mL | Freq: Two times a day (BID) | INTRAMUSCULAR | Status: DC
  Administered 2014-01-14 – 2014-01-18 (×5): 3 mL via INTRAVENOUS

## 2014-01-14 MED ORDER — PROPRANOLOL HCL 40 MG PO TABS
40.0000 mg | ORAL_TABLET | Freq: Two times a day (BID) | ORAL | Status: DC
  Administered 2014-01-14 – 2014-01-18 (×6): 40 mg via ORAL
  Filled 2014-01-14 (×11): qty 1

## 2014-01-14 MED ORDER — SODIUM CHLORIDE 0.9 % IV SOLN
INTRAVENOUS | Status: DC
  Administered 2014-01-14 (×4): via INTRAVENOUS

## 2014-01-14 MED ORDER — OXYCODONE HCL 5 MG PO TABS
5.0000 mg | ORAL_TABLET | ORAL | Status: DC | PRN
  Administered 2014-01-14 – 2014-01-18 (×11): 5 mg via ORAL
  Filled 2014-01-14 (×11): qty 1

## 2014-01-14 MED ORDER — SODIUM CHLORIDE 0.9 % IV BOLUS (SEPSIS)
1000.0000 mL | Freq: Once | INTRAVENOUS | Status: AC
  Administered 2014-01-14: 1000 mL via INTRAVENOUS

## 2014-01-14 MED ORDER — INSULIN ASPART 100 UNIT/ML ~~LOC~~ SOLN
0.0000 [IU] | Freq: Three times a day (TID) | SUBCUTANEOUS | Status: DC
  Administered 2014-01-14: 2 [IU] via SUBCUTANEOUS
  Administered 2014-01-14 (×2): 3 [IU] via SUBCUTANEOUS
  Administered 2014-01-15: 2 [IU] via SUBCUTANEOUS
  Administered 2014-01-15 – 2014-01-18 (×4): 3 [IU] via SUBCUTANEOUS
  Administered 2014-01-18: 8 [IU] via SUBCUTANEOUS
  Filled 2014-01-14 (×3): qty 1

## 2014-01-14 MED ORDER — INSULIN ASPART 100 UNIT/ML ~~LOC~~ SOLN
0.0000 [IU] | Freq: Every day | SUBCUTANEOUS | Status: DC
  Administered 2014-01-17: 2 [IU] via SUBCUTANEOUS

## 2014-01-14 MED ORDER — SODIUM CHLORIDE 0.9 % IV SOLN
25.0000 ug/h | INTRAVENOUS | Status: DC
  Administered 2014-01-14 (×2): 25 ug/h via INTRAVENOUS
  Filled 2014-01-14 (×10): qty 1

## 2014-01-14 MED ORDER — RIFAXIMIN 550 MG PO TABS
550.0000 mg | ORAL_TABLET | Freq: Two times a day (BID) | ORAL | Status: DC
  Administered 2014-01-14 – 2014-01-18 (×8): 550 mg via ORAL
  Filled 2014-01-14 (×11): qty 1

## 2014-01-14 MED ORDER — OCTREOTIDE LOAD VIA INFUSION
50.0000 ug | Freq: Once | INTRAVENOUS | Status: AC
  Administered 2014-01-14: 50 ug via INTRAVENOUS
  Filled 2014-01-14: qty 25

## 2014-01-14 MED ORDER — ALBUTEROL (5 MG/ML) CONTINUOUS INHALATION SOLN
INHALATION_SOLUTION | RESPIRATORY_TRACT | Status: AC
  Administered 2014-01-14: 10 mg/h via RESPIRATORY_TRACT
  Filled 2014-01-14: qty 20

## 2014-01-14 MED ORDER — DEXTROSE 50 % IV SOLN
1.0000 | Freq: Once | INTRAVENOUS | Status: AC
  Administered 2014-01-14: 50 mL via INTRAVENOUS
  Filled 2014-01-14: qty 50

## 2014-01-14 MED ORDER — ALPRAZOLAM 0.5 MG PO TABS
1.0000 mg | ORAL_TABLET | Freq: Two times a day (BID) | ORAL | Status: DC | PRN
  Administered 2014-01-14 – 2014-01-18 (×7): 1 mg via ORAL
  Filled 2014-01-14 (×7): qty 2

## 2014-01-14 MED ORDER — SODIUM POLYSTYRENE SULFONATE 15 GM/60ML PO SUSP
45.0000 g | Freq: Once | ORAL | Status: AC
  Administered 2014-01-14: 45 g via ORAL
  Filled 2014-01-14: qty 180

## 2014-01-14 MED ORDER — FUROSEMIDE 10 MG/ML IJ SOLN
40.0000 mg | Freq: Once | INTRAMUSCULAR | Status: AC
  Administered 2014-01-14: 40 mg via INTRAVENOUS
  Filled 2014-01-14: qty 4

## 2014-01-14 MED ORDER — METHYLPREDNISOLONE SODIUM SUCC 125 MG IJ SOLR
60.0000 mg | Freq: Once | INTRAMUSCULAR | Status: AC
  Administered 2014-01-14: 60 mg via INTRAVENOUS
  Filled 2014-01-14: qty 2

## 2014-01-14 MED ORDER — ALBUTEROL SULFATE (2.5 MG/3ML) 0.083% IN NEBU: 10.0000 mg | INHALATION_SOLUTION | Freq: Once | RESPIRATORY_TRACT | Status: DC

## 2014-01-14 MED ORDER — SODIUM POLYSTYRENE SULFONATE 15 GM/60ML PO SUSP
30.0000 g | Freq: Once | ORAL | Status: AC
  Administered 2014-01-14: 30 g via ORAL
  Filled 2014-01-14: qty 120

## 2014-01-14 NOTE — ED Notes (Signed)
Meal tray ordered for pt  

## 2014-01-14 NOTE — ED Notes (Signed)
Admitting MD at bedside.

## 2014-01-14 NOTE — ED Notes (Signed)
This RN at the bedside, assisting pt with meal. Ate 1/4 of lunch.

## 2014-01-14 NOTE — ED Provider Notes (Signed)
CSN: [id number]     Arrival date & time 01/13/14  2326 History   First MD Initiated Contact with Patient 01/14/14 0000     Chief Complaint  Patient presents with  . GI Bleeding     (Consider location/radiation/quality/duration/timing/severity/associated sxs/prior Treatment) HPI 58 year old male presents from home accompanied by his family with report of vomiting blood one time this evening.  Patient has history of stress of the liver, bleeding ulcers of the stomach, and esophageal varices.  Daughter is his caretaker.  She reports that he was out of his lactulose Thursday and Friday.  Patient has been back on his lactulose Saturday and Sunday.  She reports that he had a good day on Sunday and seemed more active than usual.  Today, however he has been more lethargic and confused.  Patient complains of generalized weakness.  No fevers or chills reported.  Patient has history of diabetes, CVA, for alcohol abuse, end-stage liver disease with history of hepatic encephalopathy.  Family reports some black tarry stools which he has chronically, but seem to be increased.  Past Medical History  Diagnosis Date  . Neuropathy   . Diabetes mellitus   . Bipolar affect, depressed   . Hypertension   . Arthritis   . Stroke     Mini stroke about 66yrs ago  . Cirrhosis   . Alcohol abuse   . Chronic pain   . Cocaine abuse   . Muscle spasm     both legs  . Encephalopathy, hepatic   . Detached retina   . COPD (chronic obstructive pulmonary disease)     emphysema  . Bronchitis   . Barrett's esophagus   . GERD (gastroesophageal reflux disease)     has ulcer  . Anemia    Past Surgical History  Procedure Laterality Date  . Fracture surgery      Leg and arm 16yrs ago  . Esophagogastroduodenoscopy  04/04/2012    Procedure: ESOPHAGOGASTRODUODENOSCOPY (EGD);  Surgeon: Irene Shipper, MD;  Location: [facility] ENDOSCOPY;  Service: Endoscopy;  Laterality: N/A;  . Esophagogastroduodenoscopy Left 03/13/2013   Procedure: ESOPHAGOGASTRODUODENOSCOPY (EGD);  Surgeon: Arta Silence, MD;  Location: [facility] ENDOSCOPY;  Service: Endoscopy;  Laterality: Left;  Marland Kitchen Eye surgery  8 months ago both eyes    cataracts both eyes, detached eye, gas pocket  . Vasectomy    . Pars plana vitrectomy Left 07/08/2013    Procedure: PARS PLANA VITRECTOMY WITH 25 GAUGE;  Surgeon: Hurman Horn, MD;  Location: [place];  Service: Ophthalmology;  Laterality: Left;  . Intraocular lens removal Left 07/08/2013    Procedure: REMOVAL OF INTRAOCULAR LENS;  Surgeon: Hurman Horn, MD;  Location: [place];  Service: Ophthalmology;  Laterality: Left;  . Placement and suture of secondary intraocular lens Left 07/08/2013    Procedure: PLACEMENT AND SUTURE OF SECONDARY INTRAOCULAR LENS;  Surgeon: Hurman Horn, MD;  Location: [place];  Service: Ophthalmology;  Laterality: Left;  Insertion of Anterior Capsule Intraocular Lens    Family History  Problem Relation Age of Onset  . Hypotension Mother    History  Substance Use Topics  . Smoking status: Current Every Day Smoker -- 1.00 packs/day for 30 years    Types: Cigarettes    Last Attempt to Quit: 04/06/2012  . Smokeless tobacco: Never Used     Comment: quit   . Alcohol Use: 0.0 oz/week     Comment: 12 pk beer daily  06/2013 - no alcohol since 11/2012    Review  of Systems  Unable to perform ROS: Mental status change      Allergies  Review of patient's allergies indicates no known allergies.  Home Medications   Prior to Admission medications   Medication Sig Start Date End Date Taking? Authorizing Provider  ALPRAZolam Duanne Moron) 1 MG tablet Take 1 mg by mouth 2 (two) times daily as needed for anxiety.    Historical Provider, MD  HYDROcodone-acetaminophen (NORCO) 5-325 MG per tablet Take 1-2 tablets by mouth every 4 (four) hours as needed. 01/02/14   Veryl Speak, MD  insulin aspart (NOVOLOG) 100 UNIT/ML FlexPen Inject 6 Units into the skin 3 (three) times daily as needed for high blood sugar.  11/17/13   Malvin Johns, MD  Insulin Glargine (LANTUS SOLOSTAR) 100 UNIT/ML Solostar Pen Inject 45 Units into the skin at bedtime. 11/17/13   Malvin Johns, MD  lactulose (CHRONULAC) 10 GM/15ML solution Take 45 mLs (30 g total) by mouth daily. Titrate to at least 3 BMs daily 11/17/13   Malvin Johns, MD  lisinopril (PRINIVIL,ZESTRIL) 20 MG tablet Take 20 mg by mouth daily.    Historical Provider, MD  pantoprazole (PROTONIX) 40 MG tablet Take 40 mg by mouth daily.    Historical Provider, MD  propranolol (INDERAL) 40 MG tablet Take 40 mg by mouth 2 (two) times daily.    Historical Provider, MD  rifaximin (XIFAXAN) 550 MG TABS tablet Take 550 mg by mouth 2 (two) times daily.    Historical Provider, MD   BP 81/43  Pulse 114  Temp(Src) 98.3 F (36.8 C)  Resp 18  Ht 5\' 11"  (1.803 m)  Wt 220 lb (99.791 kg)  BMI 30.70 kg/m2  SpO2 99% Physical Exam  Nursing note and vitals reviewed. Constitutional:  Chronically ill-appearing pale, confused and lethargic.  He is oriented to self only  HENT:  Head: Normocephalic and atraumatic.  Nose: Nose normal.  Mouth/Throat: Oropharynx is clear and moist.  Eyes: Scleral icterus is present.  Pale conjunctiva  Neck: Normal range of motion. Neck supple. No JVD present. No tracheal deviation present. No thyromegaly present.  Cardiovascular: Regular rhythm, normal heart sounds and intact distal pulses.  Exam reveals no gallop and no friction rub.   No murmur heard. Tachycardia noted  Pulmonary/Chest: Effort normal and breath sounds normal. No respiratory distress. He has no wheezes. He has no rales. He exhibits no tenderness.  Abdominal: Soft. He exhibits no distension. There is tenderness.  Musculoskeletal: He exhibits edema (2+ pitting edema bilaterally to knees).  Lymphadenopathy:    He has no cervical adenopathy.  Neurological:  Patient is lethargic.  He will follow commands but barely  Skin: Skin is warm. No rash noted. No erythema. No pallor.    ED  Course  Procedures (including critical care time) Labs Review Labs Reviewed  CBC - Abnormal; Notable for the following:    RBC 2.20 (*)    Hemoglobin 7.1 (*)    HCT 20.9 (*)    RDW 16.2 (*)    Platelets 97 (*)    All other components within normal limits  COMPREHENSIVE METABOLIC PANEL - Abnormal; Notable for the following:    Potassium 6.4 (*)    CO2 16 (*)    Glucose, Bld 131 (*)    BUN 39 (*)    Creatinine, Ser 1.70 (*)    Total Protein 5.4 (*)    Albumin 2.4 (*)    AST 66 (*)    Total Bilirubin 2.0 (*)    GFR calc non  Af Amer 43 (*)    GFR calc Af Amer 49 (*)    Anion gap 19 (*)    All other components within normal limits  PROTIME-INR - Abnormal; Notable for the following:    Prothrombin Time 20.3 (*)    INR 1.73 (*)    All other components within normal limits  AMMONIA - Abnormal; Notable for the following:    Ammonia 102 (*)    All other components within normal limits  POC OCCULT BLOOD, ED  TYPE AND SCREEN  PREPARE RBC (CROSSMATCH)    Imaging Review No results found.   EKG Interpretation   Date/Time:  Tuesday January 14 2014 01:00:50 EDT Ventricular Rate:  99 PR Interval:  164 QRS Duration: 84 QT Interval:  355 QTC Calculation: 456 R Axis:   18 Text Interpretation:  Sinus rhythm Baseline wander in lead(s) V1 Confirmed  by Emmakate Hypes  MD, Trachelle Low ([id number]) on 01/14/2014 1:02:44 AM     CRITICAL CARE Performed by: Kalman Drape Total critical care time: 60 min Critical care time was exclusive of separately billable procedures and treating other patients. Critical care was necessary to treat or prevent imminent or life-threatening deterioration. Critical care was time spent personally by me on the following activities: development of treatment plan with patient and/or surrogate as well as nursing, discussions with consultants, evaluation of patient's response to treatment, examination of patient, obtaining history from patient or surrogate, ordering and performing  treatments and interventions, ordering and review of laboratory studies, ordering and review of radiographic studies, pulse oximetry and re-evaluation of patient's condition.  MDM   Final diagnoses:  Upper GI bleed  Anemia requiring transfusions  Hepatic encephalopathy  End stage liver disease    58 year old male with upper GI bleed approximately 300 cc at home no further vomiting.  Patient is lethargic and concerning for hepatic encephalopathy.  Patient initially hypertensive, has improved with fluids.  Will start on octreotide, Protonix.  Patient noted to be hyperkalemic, Kayexalate ordered, EKG without peaked T waves    Kalman Drape, MD 01/14/14 [phone]

## 2014-01-14 NOTE — ED Notes (Signed)
CBG check of 197

## 2014-01-14 NOTE — ED Notes (Signed)
Daughter leaving for a while. Please call if he moves upstairs to a room.

## 2014-01-14 NOTE — ED Notes (Signed)
Phlebotomy at the bedside for labs.

## 2014-01-14 NOTE — H&P (Addendum)
[facility] History and Physical  [name] VVO:[id number] DOB: 06-01-1955 DOA: 01/13/2014  Referring physician: Dr Sharol Given. - ED PCP: Barbette Merino, MD  Specialists: GI  Chief Complaint: AMS  Assessment/Plan Active Problems:   * No active hospital problems. * Liver failure AMS CVA COPD Encephalopathy Anemia HyperK AKI Hypotension Thrombocytopenia Wheezing  Acute hepatic encephalopathy: baseline encephalopathy per daughter. Pt never oriented to time/date. Knows familiar people. Acute worsening likely from hyperammonemia from missed doses of lactulose from hepatic failure. Ammonia 102. INR 1.73. Protein 5.4, albumin 2.4, Bili 2. Unlikely from infection such as SBP or CAP. WBC nml at 10.3, afebrile. MELD score w/ baseline Cr of 1 = 15. Plt 97 - Admit to step down - GI following per ED - Pts GI is Dr. Paulita Fujita - Increase Lactulose to 60mg  until AMS improves and BMs become more regular.  -  Continue spironolactone when K normalizes and pressure stabilizes - Start lasix when pressure improves - +/- paracentesis if pt becomes symptomatic - continue home Rifaxamin  Anemia: Acute blood loss likely from bleed esophageal varices. Hgb on admission 7.1. Baseline around 11. Transfusion 2 units PRBC in ED. Octreotide in ED - Post transfusion CBC - Cont home propranolol - consider GI consult if Hgb continues to drop for possible EGD/Banding (H/o PUD and ulcerative bleeds).    AKI: likely from hypotension and anemia in conjunction w/ ACEi.  - Fluids as above - Hold ACEi - BMET in am  Wheezing: No h/o asthma. Moving good air and w/o consolidation.  - DUonebs Q4 - Solumedrol 60 x 1 (consider scheduled dosing of 60 if not improving or 125mg  bolus)  Hypotension: likely secondary to dehydration, anemia, and Cirrhosis. Improved w/ NS bolus - IVF NS 164ml/hr - Hold hom spiro, lisinopril  DM: insulin dependent. A1c 09/13/13 7.5 - A1c - SSI  H/o CVA: No signs of acute new onset  infarct - Neuro checks as above  Code Status:  FULL  Family Communication: Daughter present for entire encounter Disposition Plan: Pending improvement  HPI: [name] is a 58 y.o. male came to [facility] ed 01/14/2014 with  Emesis x 1 of about 300 cc at home. Very weak at baseline but getting progressively weaker lately. At baseline pt is confused about the date. Became significantly more confused than normal on Monday. Missed Lactulose dose on Thu and Friday. Took nml dose on Sat and Sun. Started Wheezing on Thursday.   Review of Systems: Per HPI w/ all other systems negative.   Past Medical History  Diagnosis Date  . Neuropathy   . Diabetes mellitus   . Bipolar affect, depressed   . Hypertension   . Arthritis   . Stroke     Mini stroke about 31yrs ago  . Cirrhosis   . Alcohol abuse   . Chronic pain   . Cocaine abuse   . Muscle spasm     both legs  . Encephalopathy, hepatic   . Detached retina   . COPD (chronic obstructive pulmonary disease)     emphysema  . Bronchitis   . Barrett's esophagus   . GERD (gastroesophageal reflux disease)     has ulcer  . Anemia    Past Surgical History  Procedure Laterality Date  . Fracture surgery      Leg and arm 66yrs ago  . Esophagogastroduodenoscopy  04/04/2012    Procedure: ESOPHAGOGASTRODUODENOSCOPY (EGD);  Surgeon: Irene Shipper, MD;  Location[facility] ENDOSCOPY;  Service: Endoscopy;  Laterality: N/A;  .  Esophagogastroduodenoscopy Left 03/13/2013    Procedure: ESOPHAGOGASTRODUODENOSCOPY (EGD);  Surgeon: Arta Silence, MD;  Location: [facility] ENDOSCOPY;  Service: Endoscopy;  Laterality: Left;  Marland Kitchen Eye surgery  8 months ago both eyes    cataracts both eyes, detached eye, gas pocket  . Vasectomy    . Pars plana vitrectomy Left 07/08/2013    Procedure: PARS PLANA VITRECTOMY WITH 25 GAUGE;  Surgeon: Hurman Horn, MD;  Location: [place];  Service: Ophthalmology;  Laterality: Left;  . Intraocular lens removal Left 07/08/2013    Procedure: REMOVAL OF  INTRAOCULAR LENS;  Surgeon: Hurman Horn, MD;  Location: [place];  Service: Ophthalmology;  Laterality: Left;  . Placement and suture of secondary intraocular lens Left 07/08/2013    Procedure: PLACEMENT AND SUTURE OF SECONDARY INTRAOCULAR LENS;  Surgeon: Hurman Horn, MD;  Location: [place];  Service: Ophthalmology;  Laterality: Left;  Insertion of Anterior Capsule Intraocular Lens    Social History:  History   Social History Narrative  . No narrative on file    No Known Allergies  Family History  Problem Relation Age of Onset  . Hypotension Mother     Prior to Admission medications   Medication Sig Start Date End Date Taking? Authorizing Provider  ALPRAZolam Duanne Moron) 1 MG tablet Take 1 mg by mouth 2 (two) times daily as needed for anxiety.    Historical Provider, MD  HYDROcodone-acetaminophen (NORCO) 5-325 MG per tablet Take 1-2 tablets by mouth every 4 (four) hours as needed. 01/02/14   Veryl Speak, MD  insulin aspart (NOVOLOG) 100 UNIT/ML FlexPen Inject 6 Units into the skin 3 (three) times daily as needed for high blood sugar. 11/17/13   Malvin Johns, MD  Insulin Glargine (LANTUS SOLOSTAR) 100 UNIT/ML Solostar Pen Inject 45 Units into the skin at bedtime. 11/17/13   Malvin Johns, MD  lactulose (CHRONULAC) 10 GM/15ML solution Take 45 mLs (30 g total) by mouth daily. Titrate to at least 3 BMs daily 11/17/13   Malvin Johns, MD  lisinopril (PRINIVIL,ZESTRIL) 20 MG tablet Take 20 mg by mouth daily.    Historical Provider, MD  pantoprazole (PROTONIX) 40 MG tablet Take 40 mg by mouth daily.    Historical Provider, MD  propranolol (INDERAL) 40 MG tablet Take 40 mg by mouth 2 (two) times daily.    Historical Provider, MD  rifaximin (XIFAXAN) 550 MG TABS tablet Take 550 mg by mouth 2 (two) times daily.    Historical Provider, MD   Physical Exam: Filed Vitals:   01/13/14 2341 01/14/14 0102 01/14/14 0126  BP: 81/43 139/57   Pulse: 114 102   Temp: 98.3 F (36.8 C) 98.8 F (37.1 C) 99.2 F  (37.3 C)  TempSrc:  Oral Oral  Resp: 18 18   Height: 5\' 11"  (1.803 m)    Weight: 99.791 kg (220 lb)    SpO2: 99%       General:  Moderate distress  Eyes: EOMI  ENT: dry mm  Cardiovascular: RRR, II/VI systolic murmur, 2+ pulses  Respiratory: CTAB, nml effort  Abdomen: distended, nonttp, hypoactive BS  Skin: intact,   Musculoskeletal: no joint effusion  Psychiatric: answers questions appropriately  Neurologic: CN2-12 grossly intact. AAOx2, falls asleep quickly.   Labs on Admission:  Basic Metabolic Panel:  Recent Labs Lab 01/13/14 2348  NA 137  K 6.4*  CL 102  CO2 16*  GLUCOSE 131*  BUN 39*  CREATININE 1.70*  CALCIUM 8.7   Liver Function Tests:  Recent Labs Lab 01/13/14 2348  AST 66*  ALT 27  ALKPHOS 67  BILITOT 2.0*  PROT 5.4*  ALBUMIN 2.4*   No results found for this basename: LIPASE, AMYLASE,  in the last 168 hours  Recent Labs Lab 01/14/14 0005  AMMONIA 102*   CBC:  Recent Labs Lab 01/13/14 2348  WBC 10.3  HGB 7.1*  HCT 20.9*  MCV 95.0  PLT 97*   Cardiac Enzymes: No results found for this basename: CKTOTAL, CKMB, CKMBINDEX, TROPONINI,  in the last 168 hours  BNP (last 3 results) No results found for this basename: PROBNP,  in the last 8760 hours CBG: No results found for this basename: GLUCAP,  in the last 168 hours  Radiological Exams on Admission: No results found.     Time spent: Greater than 70 min in direct pt care and coordination   MERRELL, DAVID J, MD [facility] [email or web address] Password [email or web address] 01/14/2014, 1:27 AM

## 2014-01-14 NOTE — ED Notes (Signed)
Report received from Hayley, RN.

## 2014-01-14 NOTE — ED Notes (Addendum)
CAT stopped in order to give oral meds.

## 2014-01-14 NOTE — Progress Notes (Signed)
Patient admitted after midnight by Dr. Marily Memos.  Please see H&P. +BMs x 2  Acute hepatic encephalopathy: baseline encephalopathy per daughter. Pt never oriented to time/date. Knows familiar people. Acute worsening likely from hyperammonemia from missed doses of lactulose from hepatic failure. Ammonia 102. INR 1.73. Protein 5.4, albumin 2.4, Bili 2. Unlikely from infection such as SBP or CAP. WBC nml at 10.3, afebrile. MELD score w/ baseline Cr of 1 = 15. Plt 97  - Admit to step down  - GI following per ED - Pts GI is Dr. Paulita Fujita  - Increase Lactulose to 60mg  until AMS improves and BMs at least 3/day - Continue spironolactone when K normalizes and pressure stabilizes  - Start lasix when pressure improves  - +/- paracentesis if pt becomes symptomatic  - continue home Rifaxamin   Anemia: Acute blood loss likely from bleed esophageal varices. Hgb on admission 7.1. Baseline around 11. Transfusion 2 units  PRBC in ED. Octreotide in ED  - Post transfusion CBC  - Cont home propranolol  -await GI   AKI: likely from hypotension and anemia in conjunction w/ ACEi.  - Fluids as above  - Hold ACEi   Wheezing: No h/o asthma. Moving good air and w/o consolidation.  - DUonebs Q4  - Solumedrol 60 x 1  Hypotension: likely secondary to dehydration, anemia, and Cirrhosis. Improved w/ NS bolus  - IVF NS 144ml/hr  - Hold hom spiro, lisinopril   DM: insulin dependent. A1c 09/13/13 7.5  - A1c  - SSI   H/o CVA: No signs of acute new onset infarct  - Neuro checks as above  Darrell Bear DO

## 2014-01-15 ENCOUNTER — Inpatient Hospital Stay (HOSPITAL_COMMUNITY): Payer: Medicare PPO

## 2014-01-15 LAB — COMPREHENSIVE METABOLIC PANEL
ALBUMIN: 2.3 g/dL — AB (ref 3.5–5.2)
ALK PHOS: 63 U/L (ref 39–117)
ALT: 39 U/L (ref 0–53)
AST: 98 U/L — ABNORMAL HIGH (ref 0–37)
Anion gap: 12 (ref 5–15)
BUN: 57 mg/dL — ABNORMAL HIGH (ref 6–23)
CO2: 19 meq/L (ref 19–32)
CREATININE: 2.16 mg/dL — AB (ref 0.50–1.35)
Calcium: 7.3 mg/dL — ABNORMAL LOW (ref 8.4–10.5)
Chloride: 108 mEq/L (ref 96–112)
GFR calc Af Amer: 37 mL/min — ABNORMAL LOW (ref >90)
GFR, EST NON AFRICAN AMERICAN: 32 mL/min — AB (ref >90)
Glucose, Bld: 162 mg/dL — ABNORMAL HIGH (ref 70–99)
POTASSIUM: 5.2 meq/L (ref 3.7–5.3)
SODIUM: 139 meq/L (ref 137–147)
Total Bilirubin: 1.6 mg/dL — ABNORMAL HIGH (ref 0.3–1.2)
Total Protein: 4.9 g/dL — ABNORMAL LOW (ref 6.0–8.3)

## 2014-01-15 LAB — RETICULOCYTES
RBC.: 2.51 MIL/uL — AB (ref 4.22–5.81)
Retic Count, Absolute: 80.3 10*3/uL (ref 19.0–186.0)
Retic Ct Pct: 3.2 % — ABNORMAL HIGH (ref 0.4–3.1)

## 2014-01-15 LAB — URINALYSIS, ROUTINE W REFLEX MICROSCOPIC
Bilirubin Urine: NEGATIVE
Glucose, UA: NEGATIVE mg/dL
KETONES UR: NEGATIVE mg/dL
Nitrite: NEGATIVE
PH: 6 (ref 5.0–8.0)
Protein, ur: NEGATIVE mg/dL
Specific Gravity, Urine: 1.017 (ref 1.005–1.030)
UROBILINOGEN UA: 0.2 mg/dL (ref 0.0–1.0)

## 2014-01-15 LAB — URINE MICROSCOPIC-ADD ON

## 2014-01-15 LAB — FOLATE: FOLATE: 14 ng/mL

## 2014-01-15 LAB — GLUCOSE, CAPILLARY
Glucose-Capillary: 128 mg/dL — ABNORMAL HIGH (ref 70–99)
Glucose-Capillary: 176 mg/dL — ABNORMAL HIGH (ref 70–99)
Glucose-Capillary: 190 mg/dL — ABNORMAL HIGH (ref 70–99)
Glucose-Capillary: 159 mg/dL — ABNORMAL HIGH (ref 70–99)

## 2014-01-15 LAB — POTASSIUM
POTASSIUM: 5.3 meq/L (ref 3.7–5.3)
Potassium: 5.3 mEq/L (ref 3.7–5.3)

## 2014-01-15 LAB — VITAMIN B12: Vitamin B-12: 1213 pg/mL — ABNORMAL HIGH (ref 211–911)

## 2014-01-15 LAB — IRON AND TIBC
Iron: 29 ug/dL — ABNORMAL LOW (ref 42–135)
SATURATION RATIOS: 9 % — AB (ref 20–55)
TIBC: 311 ug/dL (ref 215–435)
UIBC: 282 ug/dL (ref 125–400)

## 2014-01-15 LAB — FERRITIN: Ferritin: 38 ng/mL (ref 22–322)

## 2014-01-15 LAB — OSMOLALITY: Osmolality: 309 mOsm/kg — ABNORMAL HIGH (ref 275–300)

## 2014-01-15 LAB — OSMOLALITY, URINE: Osmolality, Ur: 618 mOsm/kg (ref 390–1090)

## 2014-01-15 LAB — CREATININE, URINE, RANDOM: Creatinine, Urine: 84.39 mg/dL

## 2014-01-15 LAB — SODIUM, URINE, RANDOM: Sodium, Ur: 98 mEq/L

## 2014-01-15 MED ORDER — LACTULOSE 10 GM/15ML PO SOLN
30.0000 g | Freq: Three times a day (TID) | ORAL | Status: DC
  Administered 2014-01-15 – 2014-01-18 (×7): 30 g via ORAL
  Filled 2014-01-15 (×12): qty 45

## 2014-01-15 MED ORDER — SODIUM CHLORIDE 0.9 % IV SOLN
INTRAVENOUS | Status: DC
  Administered 2014-01-16: 04:00:00 via INTRAVENOUS

## 2014-01-15 MED ORDER — SODIUM POLYSTYRENE SULFONATE 15 GM/60ML PO SUSP
15.0000 g | Freq: Once | ORAL | Status: AC
  Administered 2014-01-15: 15 g via ORAL
  Filled 2014-01-15: qty 60

## 2014-01-15 MED ORDER — CIPROFLOXACIN IN D5W 400 MG/200ML IV SOLN
400.0000 mg | Freq: Two times a day (BID) | INTRAVENOUS | Status: DC
  Administered 2014-01-16 – 2014-01-18 (×5): 400 mg via INTRAVENOUS
  Filled 2014-01-15 (×8): qty 200

## 2014-01-15 NOTE — Consult Note (Signed)
[facility] CONSULT Reason for consult: G.I. bleeding and history of cirrhosis Referring Physician: [facility]. Primary G.I.: Dr. Johna Baker [name] is an 58 y.o. male.  HPI: he has a history of alcoholic cirrhosis but is not had any alcohol for a year and a half according to his history. He has been followed for encephalopathy by Dr. Paulita Baker he has been treated with lactulose and rough eczema and. He came in with positive stools weakness and a drop in hemoglobin down to 7.1. He has been started on octreotide drip. He has had previous G.I. bleeding with EGD's. Last year EGD did not show any varices. Patient has had history in the past CVA is a diabetic. He also has psychiatric issues. He adamantly denies any NSAID use.  Past Medical History  Diagnosis Date  . Neuropathy   . Diabetes mellitus   . Bipolar affect, depressed   . Hypertension   . Arthritis   . Stroke     Mini stroke about 65yr ago  . Cirrhosis   . Alcohol abuse   . Chronic pain   . Cocaine abuse   . Muscle spasm     both legs  . Encephalopathy, hepatic   . Detached retina   . COPD (chronic obstructive pulmonary disease)     emphysema  . Bronchitis   . Barrett's esophagus   . GERD (gastroesophageal reflux disease)     has ulcer  . Anemia     Past Surgical History  Procedure Laterality Date  . Fracture surgery      Leg and arm 161yrago  . Esophagogastroduodenoscopy  04/04/2012    Procedure: ESOPHAGOGASTRODUODENOSCOPY (EGD);  Surgeon: JoIrene ShipperMD;  [facility];  Service: Endoscopy;  Laterality: N/A;  . Esophagogastroduodenoscopy Left 03/13/2013    Procedure: ESOPHAGOGASTRODUODENOSCOPY (EGD);  Surgeon: WiArta SilenceMD;  [facility];  Service: Endoscopy;  Laterality: Left;  . Marland Kitchenye surgery  8 months ago both eyes    cataracts both eyes, detached eye, gas pocket  . Vasectomy    . Pars plana vitrectomy Left 07/08/2013    Procedure: PARS PLANA VITRECTOMY WITH 25 GAUGE;   Surgeon: GaHurman HornMD;  [place] [facility];  Laterality: Left;  . Intraocular lens removal Left 07/08/2013    Procedure: REMOVAL OF INTRAOCULAR LENS;  Surgeon: GaHurman HornMD;  [facility];  Laterality: Left;  . Placement and suture of secondary intraocular lens Left 07/08/2013    Procedure: PLACEMENT AND SUTURE OF SECONDARY INTRAOCULAR LENS;  Surgeon: GaHurman HornMD;  [facility];  Laterality: Left;  Insertion of Anterior Capsule Intraocular Lens     Family History  Problem Relation Age of Onset  . Hypotension Mother     Social History:  reports that he has been smoking Cigarettes.  He has a 30 pack-year smoking history. He has never used smokeless tobacco. He reports that he drinks alcohol. He reports that he uses illicit drugs (Cocaine).  Allergies: No Known Allergies  Medications; Prior to Admission medications   Medication Sig Start Date End Date Taking? Authorizing Provider  ALPRAZolam (XDuanne Moron1 MG tablet Take 1 mg by mouth 2 (two) times daily as needed for anxiety.   Yes Historical Provider, MD  insulin aspart (NOVOLOG) 100 UNIT/ML FlexPen Inject 6 Units into the skin 3 (three) times daily as needed for high blood sugar. 11/17/13  Yes MeMalvin JohnsMD  Insulin Glargine (  LANTUS SOLOSTAR) 100 UNIT/ML Solostar Pen Inject 45 Units into the skin at bedtime. 11/17/13  Yes Malvin Johns, MD  lactulose (CHRONULAC) 10 GM/15ML solution Take 45 mLs (30 g total) by mouth daily. Titrate to at least 3 BMs daily 11/17/13  Yes Malvin Johns, MD  lisinopril (PRINIVIL,ZESTRIL) 20 MG tablet Take 20 mg by mouth daily.   Yes Historical Provider, MD  propranolol (INDERAL) 40 MG tablet Take 40 mg by mouth 2 (two) times daily.   Yes Historical Provider, MD  rifaximin (XIFAXAN) 550 MG TABS tablet Take 550 mg by mouth 2 (two) times daily.   Yes Historical Provider, MD  spironolactone (ALDACTONE) 100 MG tablet Take 100 mg by  mouth daily.   Yes Historical Provider, MD   . insulin aspart  0-15 Units Subcutaneous TID WC  . insulin aspart  0-5 Units Subcutaneous QHS  . lactulose  30 g Oral TID  . propranolol  40 mg Oral BID  . rifaximin  550 mg Oral BID  . sodium chloride  3 mL Intravenous Q12H   PRN Meds ALPRAZolam, ipratropium-albuterol, morphine injection, ondansetron (ZOFRAN) IV, oxyCODONE Results for orders placed during the hospital encounter of 01/13/14 (from the past 48 hour(s))  CBC     Status: Abnormal   Collection Time    01/13/14 11:48 PM      Result Value Ref Range   WBC 10.3  4.0 - 10.5 K/uL   RBC 2.20 (*) 4.22 - 5.81 MIL/uL   Hemoglobin 7.1 (*) 13.0 - 17.0 g/dL   HCT 20.9 (*) 39.0 - 52.0 %   MCV 95.0  78.0 - 100.0 fL   MCH 32.3  26.0 - 34.0 pg   MCHC 34.0  30.0 - 36.0 g/dL   RDW 16.2 (*) 11.5 - 15.5 %   Platelets 97 (*) 150 - 400 K/uL   Comment: CONSISTENT WITH PREVIOUS RESULT  COMPREHENSIVE METABOLIC PANEL     Status: Abnormal   Collection Time    01/13/14 11:48 PM      Result Value Ref Range   Sodium 137  137 - 147 mEq/L   Potassium 6.4 (*) 3.7 - 5.3 mEq/L   Chloride 102  96 - 112 mEq/L   CO2 16 (*) 19 - 32 mEq/L   Glucose, Bld 131 (*) 70 - 99 mg/dL   BUN 39 (*) 6 - 23 mg/dL   Creatinine, Ser 1.70 (*) 0.50 - 1.35 mg/dL   Calcium 8.7  8.4 - 10.5 mg/dL   Total Protein 5.4 (*) 6.0 - 8.3 g/dL   Albumin 2.4 (*) 3.5 - 5.2 g/dL   AST 66 (*) 0 - 37 U/L   ALT 27  0 - 53 U/L   Alkaline Phosphatase 67  39 - 117 U/L   Total Bilirubin 2.0 (*) 0.3 - 1.2 mg/dL   GFR calc non Af Amer 43 (*) >90 mL/min   GFR calc Af Amer 49 (*) >90 mL/min   Comment: (NOTE)     The eGFR has been calculated using the CKD EPI equation.     This calculation has not been validated in all clinical situations.     eGFR's persistently <90 mL/min signify possible Chronic Kidney     Disease.   Anion gap 19 (*) 5 - 15  TYPE AND SCREEN     Status: None   Collection Time    01/13/14 11:52 PM      Result Value Ref  Range   ABO/RH(D) O POS  Antibody Screen NEG     Sample Expiration 01/16/2014     Unit Number [id number]     Blood Component Type RED CELLS,LR     Unit division 00     Status of Unit ISSUED,FINAL     Transfusion Status OK TO TRANSFUSE     Crossmatch Result Compatible     Unit Number [id number]     Blood Component Type RED CELLS,LR     Unit division 00     Status of Unit ISSUED,FINAL     Transfusion Status OK TO TRANSFUSE     Crossmatch Result Compatible    PROTIME-INR     Status: Abnormal   Collection Time    01/13/14 11:56 PM      Result Value Ref Range   Prothrombin Time 20.3 (*) 11.6 - 15.2 seconds   INR 1.73 (*) 0.00 - 1.49  PREPARE RBC (CROSSMATCH)     Status: None   Collection Time    01/14/14 12:00 AM      Result Value Ref Range   Order Confirmation ORDER PROCESSED BY BLOOD BANK    AMMONIA     Status: Abnormal   Collection Time    01/14/14 12:05 AM      Result Value Ref Range   Ammonia 102 (*) 11 - 60 umol/L  CBG MONITORING, ED     Status: Abnormal   Collection Time    01/14/14  8:45 AM      Result Value Ref Range   Glucose-Capillary 131 (*) 70 - 99 mg/dL  COMPREHENSIVE METABOLIC PANEL     Status: Abnormal   Collection Time    01/14/14 10:30 AM      Result Value Ref Range   Sodium 135 (*) 137 - 147 mEq/L   Potassium 6.7 (*) 3.7 - 5.3 mEq/L   Comment: NO VISIBLE HEMOLYSIS     CRITICAL RESULT CALLED TO, READ BACK BY AND VERIFIED WITH:     H.RINGLEY,RN 01/14/14 1109 BY BSLADE   Chloride 105  96 - 112 mEq/L   CO2 18 (*) 19 - 32 mEq/L   Glucose, Bld 166 (*) 70 - 99 mg/dL   BUN 47 (*) 6 - 23 mg/dL   Creatinine, Ser 1.91 (*) 0.50 - 1.35 mg/dL   Calcium 7.4 (*) 8.4 - 10.5 mg/dL   Total Protein 5.1 (*) 6.0 - 8.3 g/dL   Albumin 2.4 (*) 3.5 - 5.2 g/dL   AST 90 (*) 0 - 37 U/L   ALT 37  0 - 53 U/L   Alkaline Phosphatase 64  39 - 117 U/L   Total Bilirubin 2.3 (*) 0.3 - 1.2 mg/dL   GFR calc non Af Amer 37 (*) >90 mL/min   GFR calc Af Amer 43 (*) >90 mL/min    Comment: (NOTE)     The eGFR has been calculated using the CKD EPI equation.     This calculation has not been validated in all clinical situations.     eGFR's persistently <90 mL/min signify possible Chronic Kidney     Disease.   Anion gap 12  5 - 15  APTT     Status: Abnormal   Collection Time    01/14/14 10:30 AM      Result Value Ref Range   aPTT 40 (*) 24 - 37 seconds   Comment:            IF BASELINE aPTT IS ELEVATED,     SUGGEST PATIENT RISK ASSESSMENT  BE USED TO DETERMINE APPROPRIATE     ANTICOAGULANT THERAPY.  PROTIME-INR     Status: Abnormal   Collection Time    01/14/14 10:30 AM      Result Value Ref Range   Prothrombin Time 19.6 (*) 11.6 - 15.2 seconds   INR 1.66 (*) 0.00 - 1.49  HEMOGLOBIN A1C     Status: Abnormal   Collection Time    01/14/14 10:30 AM      Result Value Ref Range   Hemoglobin A1C 6.4 (*) <5.7 %   Comment: (NOTE)                                                                               According to the ADA Clinical Practice Recommendations for 2011, when     HbA1c is used as a screening test:      >=6.5%   Diagnostic of Diabetes Mellitus               (if abnormal result is confirmed)     5.7-6.4%   Increased risk of developing Diabetes Mellitus     References:Diagnosis and Classification of Diabetes Mellitus,Diabetes     DJSH,7026,37(CHYIF 1):S62-S69 and Standards of Medical Care in             Diabetes - 2011,Diabetes Care,2011,34 (Suppl 1):S11-S61.   Mean Plasma Glucose 137 (*) <117 mg/dL   Comment: Performed at [facility]  CBC WITH DIFFERENTIAL     Status: Abnormal   Collection Time    01/14/14 10:30 AM      Result Value Ref Range   WBC 5.3  4.0 - 10.5 K/uL   RBC 2.65 (*) 4.22 - 5.81 MIL/uL   Hemoglobin 8.0 (*) 13.0 - 17.0 g/dL   HCT 24.5 (*) 39.0 - 52.0 %   MCV 92.5  78.0 - 100.0 fL   MCH 30.2  26.0 - 34.0 pg   MCHC 32.7  30.0 - 36.0 g/dL   RDW 17.5 (*) 11.5 - 15.5 %   Platelets 56 (*) 150 - 400 K/uL   Comment:  REPEATED TO VERIFY   Neutrophils Relative % 88 (*) 43 - 77 %   Neutro Abs 4.7  1.7 - 7.7 K/uL   Lymphocytes Relative 11 (*) 12 - 46 %   Lymphs Abs 0.6 (*) 0.7 - 4.0 K/uL   Monocytes Relative 1 (*) 3 - 12 %   Monocytes Absolute 0.1  0.1 - 1.0 K/uL   Eosinophils Relative 0  0 - 5 %   Eosinophils Absolute 0.0  0.0 - 0.7 K/uL   Basophils Relative 0  0 - 1 %   Basophils Absolute 0.0  0.0 - 0.1 K/uL  BASIC METABOLIC PANEL     Status: Abnormal   Collection Time    01/14/14 12:27 PM      Result Value Ref Range   Sodium 136 (*) 137 - 147 mEq/L   Potassium 5.9 (*) 3.7 - 5.3 mEq/L   Chloride 106  96 - 112 mEq/L   CO2 17 (*) 19 - 32 mEq/L   Glucose, Bld 221 (*) 70 - 99 mg/dL   BUN 50 (*) 6 - 23 mg/dL  Creatinine, Ser 2.02 (*) 0.50 - 1.35 mg/dL   Calcium 7.5 (*) 8.4 - 10.5 mg/dL   GFR calc non Af Amer 35 (*) >90 mL/min   GFR calc Af Amer 40 (*) >90 mL/min   Comment: (NOTE)     The eGFR has been calculated using the CKD EPI equation.     This calculation has not been validated in all clinical situations.     eGFR's persistently <90 mL/min signify possible Chronic Kidney     Disease.   Anion gap 13  5 - 15  CBG MONITORING, ED     Status: Abnormal   Collection Time    01/14/14 12:47 PM      Result Value Ref Range   Glucose-Capillary 197 (*) 70 - 99 mg/dL  GLUCOSE, RANDOM     Status: Abnormal   Collection Time    01/14/14  2:51 PM      Result Value Ref Range   Glucose, Bld 198 (*) 70 - 99 mg/dL  CBG MONITORING, ED     Status: Abnormal   Collection Time    01/14/14  2:51 PM      Result Value Ref Range   Glucose-Capillary 186 (*) 70 - 99 mg/dL  POTASSIUM     Status: Abnormal   Collection Time    01/14/14  4:14 PM      Result Value Ref Range   Potassium 6.1 (*) 3.7 - 5.3 mEq/L  MRSA PCR SCREENING     Status: None   Collection Time    01/14/14  4:52 PM      Result Value Ref Range   MRSA by PCR NEGATIVE  NEGATIVE   Comment:            The GeneXpert MRSA Assay (FDA     approved  for NASAL specimens     only), is one component of a     comprehensive MRSA colonization     surveillance program. It is not     intended to diagnose MRSA     infection nor to guide or     monitor treatment for     MRSA infections.  GLUCOSE, CAPILLARY     Status: Abnormal   Collection Time    01/14/14  4:56 PM      Result Value Ref Range   Glucose-Capillary 196 (*) 70 - 99 mg/dL   Comment 1 Notify RN     Comment 2 Documented in Chart    URINALYSIS, ROUTINE W REFLEX MICROSCOPIC     Status: Abnormal   Collection Time    01/14/14  5:00 PM      Result Value Ref Range   Color, Urine AMBER (*) YELLOW   Comment: BIOCHEMICALS MAY BE AFFECTED BY COLOR   APPearance CLEAR  CLEAR   Specific Gravity, Urine 1.018  1.005 - 1.030   pH 5.0  5.0 - 8.0   Glucose, UA NEGATIVE  NEGATIVE mg/dL   Hgb urine dipstick NEGATIVE  NEGATIVE   Bilirubin Urine NEGATIVE  NEGATIVE   Ketones, ur 15 (*) NEGATIVE mg/dL   Protein, ur NEGATIVE  NEGATIVE mg/dL   Urobilinogen, UA 0.2  0.0 - 1.0 mg/dL   Nitrite NEGATIVE  NEGATIVE   Leukocytes, UA NEGATIVE  NEGATIVE   Comment: MICROSCOPIC NOT DONE ON URINES WITH NEGATIVE PROTEIN, BLOOD, LEUKOCYTES, NITRITE, OR GLUCOSE <1000 mg/dL.  GLUCOSE, CAPILLARY     Status: Abnormal   Collection Time    01/14/14  9:45 PM  Result Value Ref Range   Glucose-Capillary 157 (*) 70 - 99 mg/dL   Comment 1 Documented in Chart     Comment 2 Notify RN    POTASSIUM     Status: Abnormal   Collection Time    01/14/14 11:26 PM      Result Value Ref Range   Potassium 5.8 (*) 3.7 - 5.3 mEq/L  CBC     Status: Abnormal   Collection Time    01/14/14 11:30 PM      Result Value Ref Range   WBC 7.2  4.0 - 10.5 K/uL   RBC 2.52 (*) 4.22 - 5.81 MIL/uL   Hemoglobin 7.9 (*) 13.0 - 17.0 g/dL   HCT 22.8 (*) 39.0 - 52.0 %   MCV 90.5  78.0 - 100.0 fL   MCH 31.3  26.0 - 34.0 pg   MCHC 34.6  30.0 - 36.0 g/dL   RDW 17.5 (*) 11.5 - 15.5 %   Platelets 55 (*) 150 - 400 K/uL   Comment: CONSISTENT  WITH PREVIOUS RESULT  COMPREHENSIVE METABOLIC PANEL     Status: Abnormal   Collection Time    01/15/14  3:04 AM      Result Value Ref Range   Sodium 139  137 - 147 mEq/L   Potassium 5.2  3.7 - 5.3 mEq/L   Chloride 108  96 - 112 mEq/L   CO2 19  19 - 32 mEq/L   Glucose, Bld 162 (*) 70 - 99 mg/dL   BUN 57 (*) 6 - 23 mg/dL   Creatinine, Ser 2.16 (*) 0.50 - 1.35 mg/dL   Calcium 7.3 (*) 8.4 - 10.5 mg/dL   Total Protein 4.9 (*) 6.0 - 8.3 g/dL   Albumin 2.3 (*) 3.5 - 5.2 g/dL   AST 98 (*) 0 - 37 U/L   ALT 39  0 - 53 U/L   Alkaline Phosphatase 63  39 - 117 U/L   Total Bilirubin 1.6 (*) 0.3 - 1.2 mg/dL   GFR calc non Af Amer 32 (*) >90 mL/min   GFR calc Af Amer 37 (*) >90 mL/min   Comment: (NOTE)     The eGFR has been calculated using the CKD EPI equation.     This calculation has not been validated in all clinical situations.     eGFR's persistently <90 mL/min signify possible Chronic Kidney     Disease.   Anion gap 12  5 - 15  GLUCOSE, CAPILLARY     Status: Abnormal   Collection Time    01/15/14  7:19 AM      Result Value Ref Range   Glucose-Capillary 128 (*) 70 - 99 mg/dL   Comment 1 Notify RN     Comment 2 Documented in Chart    POTASSIUM     Status: None   Collection Time    01/15/14  8:30 AM      Result Value Ref Range   Potassium 5.3  3.7 - 5.3 mEq/L  GLUCOSE, CAPILLARY     Status: Abnormal   Collection Time    01/15/14 11:17 AM      Result Value Ref Range   Glucose-Capillary 176 (*) 70 - 99 mg/dL   Comment 1 Notify RN     Comment 2 Documented in Chart    POTASSIUM     Status: None   Collection Time    01/15/14 11:20 AM      Result Value Ref Range   Potassium 5.3  3.7 - 5.3 mEq/L  RETICULOCYTES     Status: Abnormal   Collection Time    01/15/14  1:20 PM      Result Value Ref Range   Retic Ct Pct 3.2 (*) 0.4 - 3.1 %   RBC. 2.51 (*) 4.22 - 5.81 MIL/uL   Retic Count, Manual 80.3  19.0 - 186.0 K/uL  CREATININE, URINE, RANDOM     Status: None   Collection Time     01/15/14  2:00 PM      Result Value Ref Range   Creatinine, Urine 84.39    URINALYSIS, ROUTINE W REFLEX MICROSCOPIC     Status: Abnormal   Collection Time    01/15/14  2:00 PM      Result Value Ref Range   Color, Urine YELLOW  YELLOW   APPearance CLEAR  CLEAR   Specific Gravity, Urine 1.017  1.005 - 1.030   pH 6.0  5.0 - 8.0   Glucose, UA NEGATIVE  NEGATIVE mg/dL   Hgb urine dipstick SMALL (*) NEGATIVE   Bilirubin Urine NEGATIVE  NEGATIVE   Ketones, ur NEGATIVE  NEGATIVE mg/dL   Protein, ur NEGATIVE  NEGATIVE mg/dL   Urobilinogen, UA 0.2  0.0 - 1.0 mg/dL   Nitrite NEGATIVE  NEGATIVE   Leukocytes, UA SMALL (*) NEGATIVE  SODIUM, URINE, RANDOM     Status: None   Collection Time    01/15/14  2:00 PM      Result Value Ref Range   Sodium, Ur 98    URINE MICROSCOPIC-ADD ON     Status: None   Collection Time    01/15/14  2:00 PM      Result Value Ref Range   WBC, UA 0-2  <3 WBC/hpf   RBC / HPF 0-2  <3 RBC/hpf    US Renal  01/15/2014   CLINICAL DATA:  Acute renal failure  EXAM: RENAL/URINARY TRACT ULTRASOUND COMPLETE  COMPARISON:  August 12, 2013  FINDINGS: Right Kidney:  Length: 13.7 cm. Echogenicity and renal cortical thickness are within normal limits. No mass, perinephric fluid, or hydronephrosis visualized. No sonographically demonstrable calculus or ureterectasis.  Left Kidney:  Length: 12.2 cm. Echogenicity and renal cortical thickness are within normal limits. No mass, perinephric fluid, or hydronephrosis visualized. No sonographically demonstrable calculus or ureterectasis.  Bladder:  Appears normal for degree of bladder distention.  IMPRESSION: Study within normal limits.   Electronically Signed   By: Lowella Grip M.D.   On: 01/15/2014 14:34   Dg Chest Port 1 View  01/14/2014   CLINICAL DATA:  Hematemesis.  Wheezing.  History of COPD.  EXAM: PORTABLE CHEST - 1 VIEW  COMPARISON:  11/17/2013.  FINDINGS: Poor inspiration. Grossly normal sized heart. Clear lungs. Minimal central  peribronchial thickening. Unremarkable bones.  IMPRESSION: Minimal bronchitic changes.   Electronically Signed   By: Enrique Sack M.D.   On: 01/14/2014 02:17   ROS: patient reports that he has had colonoscopy but he is unable to tell me when it was done, who performed the procedure, or what the results were.           Blood pressure 132/63, pulse 69, temperature 98 F (36.7 C), temperature source Oral, resp. rate 14, height _0  (1.778 m), weight 111.4 kg (245 lb 9.5 oz), SpO2 100.00%.  Physical exam:   General-- anxious white male nonicteric. Heart-- regular rate and rhythm without murmurs are gallops Lungs--clear  Abdomen-- soft and nontender   Assessment: 1. Acute G.I. bleeding.  This could be variceal but he currently does not appear to be having a variceal bleed. Will need to check on the results of his previous colonoscopy. 2. 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 The patient reports that he has not had alcohol for 1 1/2 years. Hopefully this is the case. His liver tests to indicate some impairment. He has apparently had ascites in the past. This platelet count as low consistent with cirrhosis. 3. Type I diabetes 4. Chronic hepatic encephalopathy  Plan: 1. The patient seems quite alert now. He remembers seeing me in the hospital in the past. I think it would be reasonable to go ahead with EGD with possible banding of varices of indicate. 2. We will place him on IV Cipro. Systemic antibiotics have been shown to reduce encephalopathy in the face of G.I. bleeding.   Bricen Victory JR,Joyia Riehle L 01/15/2014, 3:36 PM

## 2014-01-15 NOTE — Progress Notes (Signed)
Patient Demographics  [name], is a 58 y.o. male, DOB - 01-May-1956, PYK:[id number]  Admit date - 01/13/2014   Admitting Physician Waldemar Dickens, MD  Outpatient Primary MD for the patient is Barbette Merino, MD  LOS - 2   Chief Complaint  Patient presents with  . GI Bleeding        Subjective:   [name] today has, No headache, No chest pain, No abdominal pain - No Nausea, No new weakness tingling or numbness, No Cough - SOB.   Assessment & Plan    1. Hepatic encephalopathy. Due to missed lactulose, currently on Xifaxan and lactulose but improving ammonia and mentation. GI will be consulted.   2. Alcoholic cirrhosis. Esophageal varices. GI to be consulted. Consult to stop alcohol and smoking.   3. Upper GI bleed with acute anemia requiring 2 units of packed RBC transfusion. Currently H&H stable, on octreotide and PPI drip. We'll call GI also.   4. ARF. Likely due to dehydration and anemia. H&H is stable after transfusion, gentle IV fluids, check urine osmolality urine osmolality urine sodium. Repeat BMP in the morning. Check renal ultrasound.   5. Pre DM - SSI  Lab Results  Component Value Date   HGBA1C 6.4* 01/14/2014    CBG (last 3)   Recent Labs  01/14/14 1656 01/14/14 2145 01/15/14 0719  GLUCAP 196* 157* 128*     6.H.O CVA - no FN deficits, supportive care.   Code Status: Full  Family Communication: Daughter bedside  Disposition Plan: Home with daughter   Procedures renal ultrasound   Consults GI [facility]   Medications  Scheduled Meds: . insulin aspart  0-15 Units Subcutaneous TID WC  . insulin aspart  0-5 Units Subcutaneous QHS  . lactulose  30 g Oral TID  . propranolol  40 mg Oral BID  . rifaximin  550 mg Oral BID  . sodium chloride  3 mL  Intravenous Q12H  . sodium polystyrene  15 g Oral Once   Continuous Infusions: . sodium chloride    . albuterol Stopped (01/14/14 1333)  . octreotide (SANDOSTATIN) infusion 25 mcg/hr (01/15/14 0200)  . pantoprozole (PROTONIX) infusion 8 mg/hr (01/15/14 0933)   PRN Meds:.ALPRAZolam, ipratropium-albuterol, morphine injection, ondansetron (ZOFRAN) IV, oxyCODONE  DVT Prophylaxis   SCDs    Lab Results  Component Value Date   PLT 55* 01/14/2014    Antibiotics   Anti-infectives   Start     Dose/Rate Route Frequency Ordered Stop   01/14/14 1100  rifaximin (XIFAXAN) tablet 550 mg     550 mg Oral 2 times daily 01/14/14 0417     01/14/14 0000  cefTRIAXone (ROCEPHIN) 1 g in dextrose 5 % 50 mL IVPB     1 g 100 mL/hr over 30 Minutes Intravenous  Once 01/13/14 2356 01/14/14 0153          Objective:   Filed Vitals:   01/14/14 1931 01/14/14 2307 01/15/14 0449 01/15/14 0720  BP: 147/66 142/60 135/79 143/69  Pulse: 92 86 87   Temp: 97.8 F (36.6 C) 98.1 F (36.7 C) 98.1 F (36.7 C) 98 F (36.7 C)  TempSrc: Oral Oral Oral Oral  Resp: 16 15 18 13   Height:      Weight:  SpO2: 100% 100% 100% 100%    Wt Readings from Last 3 Encounters:  01/14/14 111.4 kg (245 lb 9.5 oz)  01/01/14 97.523 kg (215 lb)  09/15/13 84.4 kg (186 lb 1.1 oz)     Intake/Output Summary (Last 24 hours) at 01/15/14 1253 Last data filed at 01/15/14 0720  Gross per 24 hour  Intake    540 ml  Output   1476 ml  Net   -936 ml     Physical Exam  Awake Alert, Oriented X 2, No new F.N deficits, Normal affect [place].AT,PERRAL Supple Neck,No JVD, No cervical lymphadenopathy appriciated.  Symmetrical Chest wall movement, Good air movement bilaterally, CTAB RRR,No Gallops,Rubs or new Murmurs, No Parasternal Heave +ve B.Sounds, Abd Soft, No tenderness, No organomegaly appriciated, No rebound - guarding or rigidity. No Cyanosis, Clubbing or edema, No new Rash or bruise      Data Review   Micro  Results Recent Results (from the past 240 hour(s))  MRSA PCR SCREENING     Status: None   Collection Time    01/14/14  4:52 PM      Result Value Ref Range Status   MRSA by PCR NEGATIVE  NEGATIVE Final   Comment:            The GeneXpert MRSA Assay (FDA     approved for NASAL specimens     only), is one component of a     comprehensive MRSA colonization     surveillance program. It is not     intended to diagnose MRSA     infection nor to guide or     monitor treatment for     MRSA infections.    Radiology Reports Dg Chest Port 1 View  01/14/2014   CLINICAL DATA:  Hematemesis.  Wheezing.  History of COPD.  EXAM: PORTABLE CHEST - 1 VIEW  COMPARISON:  11/17/2013.  FINDINGS: Poor inspiration. Grossly normal sized heart. Clear lungs. Minimal central peribronchial thickening. Unremarkable bones.  IMPRESSION: Minimal bronchitic changes.   Electronically Signed   By: Enrique Sack M.D.   On: 01/14/2014 02:17    CBC  Recent Labs Lab 01/13/14 2348 01/14/14 1030 01/14/14 2330  WBC 10.3 5.3 7.2  HGB 7.1* 8.0* 7.9*  HCT 20.9* 24.5* 22.8*  PLT 97* 56* 55*  MCV 95.0 92.5 90.5  MCH 32.3 30.2 31.3  MCHC 34.0 32.7 34.6  RDW 16.2* 17.5* 17.5*  LYMPHSABS  --  0.6*  --   MONOABS  --  0.1  --   EOSABS  --  0.0  --   BASOSABS  --  0.0  --     Chemistries   Recent Labs Lab 01/13/14 2348 01/14/14 1030 01/14/14 1227 01/14/14 1451 01/14/14 1614 01/14/14 2326 01/15/14 0304 01/15/14 0830 01/15/14 1120  NA 137 135* 136*  --   --   --  139  --   --   K 6.4* 6.7* 5.9*  --  6.1* 5.8* 5.2 5.3 5.3  CL 102 105 106  --   --   --  108  --   --   CO2 16* 18* 17*  --   --   --  19  --   --   GLUCOSE 131* 166* 221* 198*  --   --  162*  --   --   BUN 39* 47* 50*  --   --   --  57*  --   --   CREATININE 1.70* 1.91* 2.02*  --   --   --  2.16*  --   --   CALCIUM 8.7 7.4* 7.5*  --   --   --  7.3*  --   --   AST 66* 90*  --   --   --   --  98*  --   --   ALT 27 37  --   --   --   --  39  --   --    ALKPHOS 67 64  --   --   --   --  63  --   --   BILITOT 2.0* 2.3*  --   --   --   --  1.6*  --   --    ------------------------------------------------------------------------------------------------------------------ estimated creatinine clearance is 46.6 ml/min (by C-G formula based on Cr of 2.16). ------------------------------------------------------------------------------------------------------------------  Recent Labs  01/14/14 1030  HGBA1C 6.4*   ------------------------------------------------------------------------------------------------------------------ No results found for this basename: CHOL, HDL, LDLCALC, TRIG, CHOLHDL, LDLDIRECT,  in the last 72 hours ------------------------------------------------------------------------------------------------------------------ No results found for this basename: TSH, T4TOTAL, FREET3, T3FREE, THYROIDAB,  in the last 72 hours ------------------------------------------------------------------------------------------------------------------ No results found for this basename: VITAMINB12, FOLATE, FERRITIN, TIBC, IRON, RETICCTPCT,  in the last 72 hours  Coagulation profile  Recent Labs Lab 01/13/14 2356 01/14/14 1030  INR 1.73* 1.66*    No results found for this basename: DDIMER,  in the last 72 hours  Cardiac Enzymes No results found for this basename: CK, CKMB, TROPONINI, MYOGLOBIN,  in the last 168 hours ------------------------------------------------------------------------------------------------------------------ No components found with this basename: POCBNP,      Time Spent in minutes   35   Natthew Marlatt K M.D on 01/15/2014 at 12:53 PM  Between 7am to 7pm - Pager - [phone]  After 7pm go to [email or web address] - password [email or web address]  And look for the night coverage person covering for me after hours  [facility] Office  [phone]   **Disclaimer: This note may have been dictated with voice  recognition software. Similar sounding words can inadvertently be transcribed and this note may contain transcription errors which may not have been corrected upon publication of note.**

## 2014-01-15 NOTE — Progress Notes (Signed)
Utilization review completed.  

## 2014-01-16 ENCOUNTER — Encounter (HOSPITAL_COMMUNITY)
Admission: EM | Disposition: A | Discharge status: Home Health | Payer: Self-pay | Source: Home / Self Care | Attending: Internal Medicine

## 2014-01-16 ENCOUNTER — Encounter (HOSPITAL_COMMUNITY): Payer: Self-pay | Admitting: *Deleted

## 2014-01-16 HISTORY — PX: ESOPHAGOGASTRODUODENOSCOPY: SHX5428

## 2014-01-16 LAB — GLUCOSE, CAPILLARY
GLUCOSE-CAPILLARY: 161 mg/dL — AB (ref 70–99)
Glucose-Capillary: 71 mg/dL (ref 70–99)
Glucose-Capillary: 111 mg/dL — ABNORMAL HIGH (ref 70–99)

## 2014-01-16 LAB — URINE CULTURE
COLONY COUNT: NO GROWTH
CULTURE: NO GROWTH

## 2014-01-16 LAB — COMPREHENSIVE METABOLIC PANEL
ALT: 38 U/L (ref 0–53)
AST: 81 U/L — ABNORMAL HIGH (ref 0–37)
Albumin: 2.3 g/dL — ABNORMAL LOW (ref 3.5–5.2)
Alkaline Phosphatase: 63 U/L (ref 39–117)
Anion gap: 11 (ref 5–15)
BUN: 45 mg/dL — ABNORMAL HIGH (ref 6–23)
CALCIUM: 7 mg/dL — AB (ref 8.4–10.5)
CO2: 20 meq/L (ref 19–32)
CREATININE: 1.3 mg/dL (ref 0.50–1.35)
Chloride: 109 mEq/L (ref 96–112)
GFR calc non Af Amer: 59 mL/min — ABNORMAL LOW (ref >90)
GFR, EST AFRICAN AMERICAN: 68 mL/min — AB (ref >90)
GLUCOSE: 137 mg/dL — AB (ref 70–99)
Potassium: 4.5 mEq/L (ref 3.7–5.3)
Sodium: 140 mEq/L (ref 137–147)
Total Bilirubin: 1.3 mg/dL — ABNORMAL HIGH (ref 0.3–1.2)
Total Protein: 4.8 g/dL — ABNORMAL LOW (ref 6.0–8.3)

## 2014-01-16 LAB — CBC
HCT: 22.1 % — ABNORMAL LOW (ref 39.0–52.0)
Hemoglobin: 7.3 g/dL — ABNORMAL LOW (ref 13.0–17.0)
MCH: 31.2 pg (ref 26.0–34.0)
MCHC: 33 g/dL (ref 30.0–36.0)
MCV: 94.4 fL (ref 78.0–100.0)
PLATELETS: 52 10*3/uL — AB (ref 150–400)
RBC: 2.34 MIL/uL — ABNORMAL LOW (ref 4.22–5.81)
RDW: 18 % — AB (ref 11.5–15.5)
WBC: 5.9 10*3/uL (ref 4.0–10.5)

## 2014-01-16 LAB — PREPARE RBC (CROSSMATCH)

## 2014-01-16 LAB — AMMONIA: AMMONIA: 66 umol/L — AB (ref 11–60)

## 2014-01-16 SURGERY — EGD (ESOPHAGOGASTRODUODENOSCOPY)
Anesthesia: Moderate Sedation

## 2014-01-16 MED ORDER — DIPHENHYDRAMINE HCL 50 MG/ML IJ SOLN
INTRAMUSCULAR | Status: DC | PRN
  Administered 2014-01-16 (×2): 12.5 mg via INTRAVENOUS

## 2014-01-16 MED ORDER — LISINOPRIL 20 MG PO TABS: 20.0000 mg | ORAL_TABLET | Freq: Every day | ORAL | Status: DC

## 2014-01-16 MED ORDER — FENTANYL CITRATE 0.05 MG/ML IJ SOLN
INTRAMUSCULAR | Status: AC
  Filled 2014-01-16: qty 2

## 2014-01-16 MED ORDER — FUROSEMIDE 10 MG/ML IJ SOLN
20.0000 mg | Freq: Once | INTRAMUSCULAR | Status: AC
  Administered 2014-01-16: 20 mg via INTRAVENOUS

## 2014-01-16 MED ORDER — SPIRONOLACTONE 100 MG PO TABS: 100.0000 mg | ORAL_TABLET | Freq: Every day | ORAL | Status: DC

## 2014-01-16 MED ORDER — SODIUM CHLORIDE 0.9 % IJ SOLN
10.0000 mL | Freq: Two times a day (BID) | INTRAMUSCULAR | Status: DC
  Administered 2014-01-16 – 2014-01-18 (×4): 10 mL

## 2014-01-16 MED ORDER — BUTAMBEN-TETRACAINE-BENZOCAINE 2-2-14 % EX AERO
INHALATION_SPRAY | CUTANEOUS | Status: DC | PRN
  Administered 2014-01-16: 2 via TOPICAL

## 2014-01-16 MED ORDER — FENTANYL CITRATE 0.05 MG/ML IJ SOLN
INTRAMUSCULAR | Status: DC | PRN
  Administered 2014-01-16 (×3): 25 ug via INTRAVENOUS

## 2014-01-16 MED ORDER — FUROSEMIDE 10 MG/ML IJ SOLN
INTRAMUSCULAR | Status: AC
  Filled 2014-01-16: qty 4

## 2014-01-16 MED ORDER — SODIUM CHLORIDE 0.9 % IV SOLN: INTRAVENOUS | Status: DC

## 2014-01-16 MED ORDER — DIPHENHYDRAMINE HCL 50 MG/ML IJ SOLN
INTRAMUSCULAR | Status: AC
  Filled 2014-01-16: qty 1

## 2014-01-16 MED ORDER — INSULIN ASPART 100 UNIT/ML FLEXPEN: 6.0000 [IU] | PEN_INJECTOR | Freq: Three times a day (TID) | SUBCUTANEOUS | Status: DC | PRN

## 2014-01-16 MED ORDER — MIDAZOLAM HCL 5 MG/ML IJ SOLN
INTRAMUSCULAR | Status: AC
  Filled 2014-01-16: qty 2

## 2014-01-16 MED ORDER — MIDAZOLAM HCL 10 MG/2ML IJ SOLN
INTRAMUSCULAR | Status: DC | PRN
  Administered 2014-01-16 (×2): 2 mg via INTRAVENOUS
  Administered 2014-01-16: 1 mg via INTRAVENOUS

## 2014-01-16 MED ORDER — SODIUM CHLORIDE 0.9 % IV SOLN: Freq: Once | INTRAVENOUS | Status: DC

## 2014-01-16 MED ORDER — SODIUM CHLORIDE 0.9 % IJ SOLN: 10.0000 mL | INTRAMUSCULAR | Status: DC | PRN

## 2014-01-16 MED ORDER — LACTULOSE 10 GM/15ML PO SOLN: 30.0000 g | Freq: Every day | ORAL | Status: DC

## 2014-01-16 MED ORDER — INSULIN GLARGINE 100 UNIT/ML SOLOSTAR PEN: 45.0000 [IU] | PEN_INJECTOR | Freq: Every day | SUBCUTANEOUS | Status: DC

## 2014-01-16 NOTE — Progress Notes (Signed)
Paged IV team after unsuccessful attempts to get an IV in the pt. IV team unable to obtain access with ultrasound. IV team recommended central/PICC line. Paged Dr. Candiss Norse. He ordered PICC line placement.

## 2014-01-16 NOTE — Op Note (Signed)
[facility] [place], [id number]   ENDOSCOPY PROCEDURE REPORT  PATIENT: Darrell, Baker  MR#: [id number] BIRTHDATE: Dec 19, 1955 , 58  yrs. old GENDER: Male ENDOSCOPIST:Jemell Town Oletta Lamas, MD REFERRED BY:  [facility] PROCEDURE DATE:  01/16/2014 PROCEDURE:   EGD ASA CLASS:  class III INDICATIONS:   patient with history of cirrhosis came in with drop and hemoglobin. He has never had documented variceal bleed. Procedure is done to evaluate for variceal bleed in them to perform banding if possible MEDICATION:   Benadryl 25 mg, fentanyl 75 mcg, versed 5 mg IV TOPICAL ANESTHETIC:    cetacaine spray  DESCRIPTION OF PROCEDURE:   ThePentax adult upper endoscope was inserted in advance into the esophagus. The esophagus was vigorously irrigated. There was no bleeding seen throughout the entire esophagus. The patient may have had some very small varices in the distal esophagus that they flattened completely with gas insuflation. There were no cherry red spots or any other endoscopic signs of variceal bleed. There was no esophagitis stricture etc. The stomach was entered and there was no signs of back bleeding in the stomach. In the retroflex view, no gastric varices were identified. There was fairly marked portal gastropathy there was no active bleeding. This was located in the antrum. The pyloric channel, duodenal bulb, and 2nd duodenum were normal. The scope is withdrawn in the patient tolerated procedure well.     COMPLICATIONS: None  ENDOSCOPIC IMPRESSION: 1. Mild Varices in Esophagus. these flattened completely with insuflation of air. There are no signs to indicate recent variceal bleed this really is a very minimal fine. 2. Moderate portal gastropathy in the antrum. No signs of recent bleeding here. 3. Positive stool and anemia. Have reviewed the patient's records and cannot find any recent colonoscopy. Think he should have colonoscopy  performed while he is here in the hospital  RECOMMENDATIONS: 1. at this point we will start him all clinical liquids and will stop the octreotide infusion 2. We will arrange colonoscopy tomorrow.    _______________________________ Lorrin MaisLaurence Spates, MD 01/16/2014 9:14 AM    PATIENT NAME:  Darrell, Baker MR#: [id number]

## 2014-01-16 NOTE — Progress Notes (Signed)
Received report from Pauline, [place] in endoscopy.

## 2014-01-16 NOTE — Interval H&P Note (Signed)
History and Physical Interval Note:  01/16/2014 8:15 AM  [name]  has presented today for surgery, with the diagnosis of GI bleeding  The various methods of treatment have been discussed with the patient and family. After consideration of risks, benefits and other options for treatment, the patient has consented to  Procedure(s): ESOPHAGOGASTRODUODENOSCOPY (EGD) (N/A) ESOPHAGEAL BANDING (N/A) as a surgical intervention .  The patient's history has been reviewed, patient examined, no change in status, stable for surgery.  I have reviewed the patient's chart and labs.  Questions were answered to the patient's satisfaction.     [name],[name]

## 2014-01-16 NOTE — Progress Notes (Signed)
PT Cancellation Note  Patient Details Name: [name] MRN: [id number] DOB: 17-Oct-1955   Cancelled Treatment:    Reason Eval/Treat Not Completed: Patient at procedure or test/unavailable   Duncan Dull 01/16/2014, 7:53 AM Alben Deeds, PT DPT  [phone]

## 2014-01-16 NOTE — Progress Notes (Signed)
Patient Demographics  [name], is a 58 y.o. male, DOB - 12-28-1955, DGL:[id number]  Admit date - 01/13/2014   Admitting Physician Waldemar Dickens, MD  Outpatient Primary MD for the patient is Barbette Merino, MD  LOS - 3   Chief Complaint  Patient presents with  . GI Bleeding        Subjective:   [name] today has, No headache, No chest pain, No abdominal pain - No Nausea, No new weakness tingling or numbness, No Cough - SOB. Due to undergo EGD shortly.   Assessment & Plan    1. Hepatic encephalopathy. Due to missed lactulose, currently on Xifaxan and lactulose , improving ammonia and mentation. GI following. On SBP prophylaxis per GI with Cipro.   2. Alcoholic cirrhosis. Esophageal varices. GI saw the patient with EGD confirming varices but no upper GI bleed, supportive care as in #1 above. Patient says he has not had alcohol for 1-1/2 years, encouraged to continue abstaining.   3. Upper GI bleed with acute anemia. EGD shows varices but no upper GI bleeding source, IV of her tight stopped by GI, has received 2 units of packed RBC transfusion before and will give 2 more units on 01/16/2014, continue PPI, continue Cipro for SBP prophylaxis, GI following, due for colonoscopy 01/17/2014, monitor H&H.   4. ARF. Likely due to dehydration and anemia. Creatinine improved and ARF resolved with fluids and transfusion, we'll transfuse 2 more units on 01/09/2014 and monitor BMP.   5. Pre DM - SSI  Lab Results  Component Value Date   HGBA1C 6.4* 01/14/2014    CBG (last 3)   Recent Labs  01/15/14 1117 01/15/14 1648 01/15/14 2111  GLUCAP 176* 190* 159*     6.H.O CVA - no FN deficits, supportive care.     Code Status: Full  Family Communication: Daughter bedside  Disposition Plan:  Home with daughter   Procedures renal ultrasound   Consults GI [facility]   Medications  Scheduled Meds: . sodium chloride   Intravenous Once  . [facility] HOLD] ciprofloxacin  400 mg Intravenous Q12H  . furosemide  20 mg Intravenous Once  . [MAR HOLD] insulin aspart  0-15 Units Subcutaneous TID WC  . [MAR HOLD] insulin aspart  0-5 Units Subcutaneous QHS  . [MAR HOLD] lactulose  30 g Oral TID  . [facility] HOLD] propranolol  40 mg Oral BID  . [facility] HOLD] rifaximin  550 mg Oral BID  . [facility] HOLD] sodium chloride  3 mL Intravenous Q12H   Continuous Infusions: . albuterol Stopped (01/14/14 1333)  . pantoprozole (PROTONIX) infusion 8 mg/hr (01/16/14 0732)   PRN Meds:.[MAR HOLD] ALPRAZolam, [MAR HOLD] ipratropium-albuterol, [MAR HOLD]  morphine injection, [MAR HOLD] ondansetron (ZOFRAN) IV, [MAR HOLD] oxyCODONE  DVT Prophylaxis   SCDs    Lab Results  Component Value Date   PLT 52* 01/16/2014    Antibiotics   Anti-infectives   Start     Dose/Rate Route Frequency Ordered Stop   01/15/14 1630  [MAR Hold]  ciprofloxacin (CIPRO) IVPB 400 mg     (On MAR Hold since 01/16/14 0750)   400 mg 200 mL/hr over 60 Minutes Intravenous Every 12 hours 01/15/14 1548     01/14/14 1100  [MAR Hold]  rifaximin (XIFAXAN) tablet 550 mg     (On MAR Hold since 01/16/14 0750)   550 mg Oral 2 times daily 01/14/14 0417     01/14/14 0000  cefTRIAXone (ROCEPHIN) 1 g in dextrose 5 % 50 mL IVPB     1 g 100 mL/hr over 30 Minutes Intravenous  Once 01/13/14 2356 01/14/14 0153          Objective:   Filed Vitals:   01/16/14 0900 01/16/14 0910 01/16/14 0920 01/16/14 0930  BP:  122/58 131/58 120/56  Pulse: 59 59 59 57  Temp:  98.2 F (36.8 C)    TempSrc:      Resp: 11 10 12 13   Height:      Weight:      SpO2: 98% 98% 99% 100%    Wt Readings from Last 3 Encounters:  01/14/14 111.4 kg (245 lb 9.5 oz)  01/14/14 111.4 kg (245 lb 9.5 oz)  01/01/14 97.523 kg (215 lb)     Intake/Output Summary (Last 24 hours) at  01/16/14 1011 Last data filed at 01/16/14 0700  Gross per 24 hour  Intake 3209.37 ml  Output   2425 ml  Net 784.37 ml     Physical Exam  Awake Alert, Oriented X 2, No new F.N deficits, Normal affect [place].AT,PERRAL Supple Neck,No JVD, No cervical lymphadenopathy appriciated.  Symmetrical Chest wall movement, Good air movement bilaterally, CTAB RRR,No Gallops,Rubs or new Murmurs, No Parasternal Heave +ve B.Sounds, Abd Soft, No tenderness, No organomegaly appriciated, No rebound - guarding or rigidity. No Cyanosis, Clubbing or edema, No new Rash or bruise      Data Review   Micro Results Recent Results (from the past 240 hour(s))  MRSA PCR SCREENING     Status: None   Collection Time    01/14/14  4:52 PM      Result Value Ref Range Status   MRSA by PCR NEGATIVE  NEGATIVE Final   Comment:            The GeneXpert MRSA Assay (FDA     approved for NASAL specimens     only), is one component of a     comprehensive MRSA colonization     surveillance program. It is not     intended to diagnose MRSA     infection nor to guide or     monitor treatment for     MRSA infections.    Radiology Reports US Renal  01/15/2014   CLINICAL DATA:  Acute renal failure  EXAM: RENAL/URINARY TRACT ULTRASOUND COMPLETE  COMPARISON:  August 12, 2013  FINDINGS: Right Kidney:  Length: 13.7 cm. Echogenicity and renal cortical thickness are within normal limits. No mass, perinephric fluid, or hydronephrosis visualized. No sonographically demonstrable calculus or ureterectasis.  Left Kidney:  Length: 12.2 cm. Echogenicity and renal cortical thickness are within normal limits. No mass, perinephric fluid, or hydronephrosis visualized. No sonographically demonstrable calculus or ureterectasis.  Bladder:  Appears normal for degree of bladder distention.  IMPRESSION: Study within normal limits.   Electronically Signed   By: Lowella Grip M.D.   On: 01/15/2014 14:34   EGD by Dr. Oletta Lamas 01/16/2014  ENDOSCOPIC  IMPRESSION:  1. Mild Varices in Esophagus. these flattened completely with insuflation of air. There are no signs to indicate recent variceal bleed this really is a very minimal fine.  2. Moderate portal gastropathy in the antrum. No signs of recent bleeding here.  3. Positive stool and anemia. Have reviewed the patient's records  and cannot find any recent colonoscopy. Think he should have colonoscopy performed while he is here in the hospital.   RECOMMENDATIONS:  1. At this point we will start him all clinical liquids and will stop the octreotide infusion  2. We will arrange colonoscopy tomorrow.   Colonoscopy by Dr. Oletta Lamas due for 01/17/2014     CBC  Recent Labs Lab 01/13/14 2348 01/14/14 1030 01/14/14 2330 01/16/14 0334  WBC 10.3 5.3 7.2 5.9  HGB 7.1* 8.0* 7.9* 7.3*  HCT 20.9* 24.5* 22.8* 22.1*  PLT 97* 56* 55* 52*  MCV 95.0 92.5 90.5 94.4  MCH 32.3 30.2 31.3 31.2  MCHC 34.0 32.7 34.6 33.0  RDW 16.2* 17.5* 17.5* 18.0*  LYMPHSABS  --  0.6*  --   --   MONOABS  --  0.1  --   --   EOSABS  --  0.0  --   --   BASOSABS  --  0.0  --   --     Chemistries   Recent Labs Lab 01/13/14 2348 01/14/14 1030 01/14/14 1227 01/14/14 1451  01/14/14 2326 01/15/14 0304 01/15/14 0830 01/15/14 1120 01/16/14 0334  NA 137 135* 136*  --   --   --  139  --   --  140  K 6.4* 6.7* 5.9*  --   < > 5.8* 5.2 5.3 5.3 4.5  CL 102 105 106  --   --   --  108  --   --  109  CO2 16* 18* 17*  --   --   --  19  --   --  20  GLUCOSE 131* 166* 221* 198*  --   --  162*  --   --  137*  BUN 39* 47* 50*  --   --   --  57*  --   --  45*  CREATININE 1.70* 1.91* 2.02*  --   --   --  2.16*  --   --  1.30  CALCIUM 8.7 7.4* 7.5*  --   --   --  7.3*  --   --  7.0*  AST 66* 90*  --   --   --   --  98*  --   --  81*  ALT 27 37  --   --   --   --  39  --   --  38  ALKPHOS 67 64  --   --   --   --  63  --   --  63  BILITOT 2.0* 2.3*  --   --   --   --  1.6*  --   --  1.3*  < > = values in this interval not  displayed. ------------------------------------------------------------------------------------------------------------------ estimated creatinine clearance is 77.4 ml/min (by C-G formula based on Cr of 1.3). ------------------------------------------------------------------------------------------------------------------  Recent Labs  01/14/14 1030  HGBA1C 6.4*   ------------------------------------------------------------------------------------------------------------------ No results found for this basename: CHOL, HDL, LDLCALC, TRIG, CHOLHDL, LDLDIRECT,  in the last 72 hours ------------------------------------------------------------------------------------------------------------------ No results found for this basename: TSH, T4TOTAL, FREET3, T3FREE, THYROIDAB,  in the last 72 hours ------------------------------------------------------------------------------------------------------------------  Recent Labs  01/15/14 1320  VITAMINB12 1213*  FOLATE 14.0  FERRITIN 38  TIBC 311  IRON 29*  RETICCTPCT 3.2*    Coagulation profile  Recent Labs Lab 01/13/14 2356 01/14/14 1030  INR 1.73* 1.66*    No results found for this basename: DDIMER,  in the last 72 hours  Cardiac Enzymes No results found  for this basename: CK, CKMB, TROPONINI, MYOGLOBIN,  in the last 168 hours ------------------------------------------------------------------------------------------------------------------ No components found with this basename: POCBNP,      Time Spent in minutes   35   SINGH,PRASHANT K M.D on 01/16/2014 at 10:11 AM  Between 7am to 7pm - Pager - [phone]  After 7pm go to [email or web address] - password [email or web address]  And look for the night coverage person covering for me after hours  [facility] Office  [phone]   **Disclaimer: This note may have been dictated with voice recognition software. Similar sounding words can inadvertently be transcribed and this note  may contain transcription errors which may not have been corrected upon publication of note.**

## 2014-01-16 NOTE — Progress Notes (Signed)
Pt currently has protonix gtt infusing and also has orders to transfuse blood. Pt only has one IV and awaiting PICC placement. Paged Dr. Candiss Norse. Ordered to hold protonix gtt for now to infuse blood.

## 2014-01-16 NOTE — Progress Notes (Signed)
Per Renaldo Fiddler, RN with IV Team, pt's PICC line placement verified with EKG and tip is in SVC. Okay to use. This RN [facility]) conveyed to pt's current RN.

## 2014-01-16 NOTE — H&P (View-Only) (Signed)
[facility] CONS[facility] Reason for consult: G.I. bleeding and history of cirrhosis Referring Physician: [facility]. Primary G.I.: Dr. Johna Baker [name] is an 58 y.o. male.  HPI: he has a history of alcoholic cirrhosis but is not had any alcohol for a year and a half according to his history. He has been followed for encephalopathy by Dr. Paulita Baker he has been treated with lactulose and rough eczema and. He came in with positive stools weakness and a drop in hemoglobin down to 7.1. He has been started on octreotide drip. He has had previous G.I. bleeding with EGD's. Last year EGD did not show any varices. Patient has had history in the past CVA is a diabetic. He also has psychiatric issues. He adamantly denies any NSAID use.  Past Medical History  Diagnosis Date  . Neuropathy   . Diabetes mellitus   . Bipolar affect, depressed   . Hypertension   . Arthritis   . Stroke     Mini stroke about 65yr ago  . Cirrhosis   . Alcohol abuse   . Chronic pain   . Cocaine abuse   . Muscle spasm     both legs  . Encephalopathy, hepatic   . Detached retina   . COPD (chronic obstructive pulmonary disease)     emphysema  . Bronchitis   . Barrett's esophagus   . GERD (gastroesophageal reflux disease)     has ulcer  . Anemia     Past Surgical History  Procedure Laterality Date  . Fracture surgery      Leg and arm 161yrago  . Esophagogastroduodenoscopy  04/04/2012    Procedure: ESOPHAGOGASTRODUODENOSCOPY (EGD);  Surgeon: JoIrene ShipperMD;  [facility];  Service: Endoscopy;  Laterality: N/A;  . Esophagogastroduodenoscopy Left 03/13/2013    Procedure: ESOPHAGOGASTRODUODENOSCOPY (EGD);  Surgeon: WiArta SilenceMD;  [facility];  Service: Endoscopy;  Laterality: Left;  . Marland Kitchenye surgery  8 months ago both eyes    cataracts both eyes, detached eye, gas pocket  . Vasectomy    . Pars plana vitrectomy Left 07/08/2013    Procedure: PARS PLANA VITRECTOMY WITH 25 GAUGE;   Surgeon: GaHurman HornMD;  [place] [facility];  Laterality: Left;  . Intraocular lens removal Left 07/08/2013    Procedure: REMOVAL OF INTRAOCULAR LENS;  Surgeon: GaHurman HornMD;  [facility];  Laterality: Left;  . Placement and suture of secondary intraocular lens Left 07/08/2013    Procedure: PLACEMENT AND SUTURE OF SECONDARY INTRAOCULAR LENS;  Surgeon: GaHurman HornMD;  [facility];  Laterality: Left;  Insertion of Anterior Capsule Intraocular Lens     Family History  Problem Relation Age of Onset  . Hypotension Mother     Social History:  reports that he has been smoking Cigarettes.  He has a 30 pack-year smoking history. He has never used smokeless tobacco. He reports that he drinks alcohol. He reports that he uses illicit drugs (Cocaine).  Allergies: No Known Allergies  Medications; Prior to Admission medications   Medication Sig Start Date End Date Taking? Authorizing Provider  ALPRAZolam (XDuanne Moron1 MG tablet Take 1 mg by mouth 2 (two) times daily as needed for anxiety.   Yes Historical Provider, MD  insulin aspart (NOVOLOG) 100 UNIT/ML FlexPen Inject 6 Units into the skin 3 (three) times daily as needed for high blood sugar. 11/17/13  Yes MeMalvin JohnsMD  Insulin Glargine (  LANTUS SOLOSTAR) 100 UNIT/ML Solostar Pen Inject 45 Units into the skin at bedtime. 11/17/13  Yes Malvin Johns, MD  lactulose (CHRONULAC) 10 GM/15ML solution Take 45 mLs (30 g total) by mouth daily. Titrate to at least 3 BMs daily 11/17/13  Yes Malvin Johns, MD  lisinopril (PRINIVIL,ZESTRIL) 20 MG tablet Take 20 mg by mouth daily.   Yes Historical Provider, MD  propranolol (INDERAL) 40 MG tablet Take 40 mg by mouth 2 (two) times daily.   Yes Historical Provider, MD  rifaximin (XIFAXAN) 550 MG TABS tablet Take 550 mg by mouth 2 (two) times daily.   Yes Historical Provider, MD  spironolactone (ALDACTONE) 100 MG tablet Take 100 mg by  mouth daily.   Yes Historical Provider, MD   . insulin aspart  0-15 Units Subcutaneous TID WC  . insulin aspart  0-5 Units Subcutaneous QHS  . lactulose  30 g Oral TID  . propranolol  40 mg Oral BID  . rifaximin  550 mg Oral BID  . sodium chloride  3 mL Intravenous Q12H   PRN Meds ALPRAZolam, ipratropium-albuterol, morphine injection, ondansetron (ZOFRAN) IV, oxyCODONE Results for orders placed during the hospital encounter of 01/13/14 (from the past 48 hour(s))  CBC     Status: Abnormal   Collection Time    01/13/14 11:48 PM      Result Value Ref Range   WBC 10.3  4.0 - 10.5 K/uL   RBC 2.20 (*) 4.22 - 5.81 MIL/uL   Hemoglobin 7.1 (*) 13.0 - 17.0 g/dL   HCT 20.9 (*) 39.0 - 52.0 %   MCV 95.0  78.0 - 100.0 fL   MCH 32.3  26.0 - 34.0 pg   MCHC 34.0  30.0 - 36.0 g/dL   RDW 16.2 (*) 11.5 - 15.5 %   Platelets 97 (*) 150 - 400 K/uL   Comment: CONSISTENT WITH PREVIOUS RESULT  COMPREHENSIVE METABOLIC PANEL     Status: Abnormal   Collection Time    01/13/14 11:48 PM      Result Value Ref Range   Sodium 137  137 - 147 mEq/L   Potassium 6.4 (*) 3.7 - 5.3 mEq/L   Chloride 102  96 - 112 mEq/L   CO2 16 (*) 19 - 32 mEq/L   Glucose, Bld 131 (*) 70 - 99 mg/dL   BUN 39 (*) 6 - 23 mg/dL   Creatinine, Ser 1.70 (*) 0.50 - 1.35 mg/dL   Calcium 8.7  8.4 - 10.5 mg/dL   Total Protein 5.4 (*) 6.0 - 8.3 g/dL   Albumin 2.4 (*) 3.5 - 5.2 g/dL   AST 66 (*) 0 - 37 U/L   ALT 27  0 - 53 U/L   Alkaline Phosphatase 67  39 - 117 U/L   Total Bilirubin 2.0 (*) 0.3 - 1.2 mg/dL   GFR calc non Af Amer 43 (*) >90 mL/min   GFR calc Af Amer 49 (*) >90 mL/min   Comment: (NOTE)     The eGFR has been calculated using the CKD EPI equation.     This calculation has not been validated in all clinical situations.     eGFR's persistently <90 mL/min signify possible Chronic Kidney     Disease.   Anion gap 19 (*) 5 - 15  TYPE AND SCREEN     Status: None   Collection Time    01/13/14 11:52 PM      Result Value Ref  Range   ABO/RH(D) O POS  Antibody Screen NEG     Sample Expiration 01/16/2014     Unit Number [id number]     Blood Component Type RED CELLS,LR     Unit division 00     Status of Unit ISSUED,FINAL     Transfusion Status OK TO TRANSFUSE     Crossmatch Result Compatible     Unit Number [id number]     Blood Component Type RED CELLS,LR     Unit division 00     Status of Unit ISSUED,FINAL     Transfusion Status OK TO TRANSFUSE     Crossmatch Result Compatible    PROTIME-INR     Status: Abnormal   Collection Time    01/13/14 11:56 PM      Result Value Ref Range   Prothrombin Time 20.3 (*) 11.6 - 15.2 seconds   INR 1.73 (*) 0.00 - 1.49  PREPARE RBC (CROSSMATCH)     Status: None   Collection Time    01/14/14 12:00 AM      Result Value Ref Range   Order Confirmation ORDER PROCESSED BY BLOOD BANK    AMMONIA     Status: Abnormal   Collection Time    01/14/14 12:05 AM      Result Value Ref Range   Ammonia 102 (*) 11 - 60 umol/L  CBG MONITORING, ED     Status: Abnormal   Collection Time    01/14/14  8:45 AM      Result Value Ref Range   Glucose-Capillary 131 (*) 70 - 99 mg/dL  COMPREHENSIVE METABOLIC PANEL     Status: Abnormal   Collection Time    01/14/14 10:30 AM      Result Value Ref Range   Sodium 135 (*) 137 - 147 mEq/L   Potassium 6.7 (*) 3.7 - 5.3 mEq/L   Comment: NO VISIBLE HEMOLYSIS     CRITICAL RESULT CALLED TO, READ BACK BY AND VERIFIED WITH:     H.RINGLEY,RN 01/14/14 1109 BY BSLADE   Chloride 105  96 - 112 mEq/L   CO2 18 (*) 19 - 32 mEq/L   Glucose, Bld 166 (*) 70 - 99 mg/dL   BUN 47 (*) 6 - 23 mg/dL   Creatinine, Ser 1.91 (*) 0.50 - 1.35 mg/dL   Calcium 7.4 (*) 8.4 - 10.5 mg/dL   Total Protein 5.1 (*) 6.0 - 8.3 g/dL   Albumin 2.4 (*) 3.5 - 5.2 g/dL   AST 90 (*) 0 - 37 U/L   ALT 37  0 - 53 U/L   Alkaline Phosphatase 64  39 - 117 U/L   Total Bilirubin 2.3 (*) 0.3 - 1.2 mg/dL   GFR calc non Af Amer 37 (*) >90 mL/min   GFR calc Af Amer 43 (*) >90 mL/min    Comment: (NOTE)     The eGFR has been calculated using the CKD EPI equation.     This calculation has not been validated in all clinical situations.     eGFR's persistently <90 mL/min signify possible Chronic Kidney     Disease.   Anion gap 12  5 - 15  APTT     Status: Abnormal   Collection Time    01/14/14 10:30 AM      Result Value Ref Range   aPTT 40 (*) 24 - 37 seconds   Comment:            IF BASELINE aPTT IS ELEVATED,     SUGGEST PATIENT RISK ASSESSMENT  BE USED TO DETERMINE APPROPRIATE     ANTICOAGULANT THERAPY.  PROTIME-INR     Status: Abnormal   Collection Time    01/14/14 10:30 AM      Result Value Ref Range   Prothrombin Time 19.6 (*) 11.6 - 15.2 seconds   INR 1.66 (*) 0.00 - 1.49  HEMOGLOBIN A1C     Status: Abnormal   Collection Time    01/14/14 10:30 AM      Result Value Ref Range   Hemoglobin A1C 6.4 (*) <5.7 %   Comment: (NOTE)                                                                               According to the ADA Clinical Practice Recommendations for 2011, when     HbA1c is used as a screening test:      >=6.5%   Diagnostic of Diabetes Mellitus               (if abnormal result is confirmed)     5.7-6.4%   Increased risk of developing Diabetes Mellitus     References:Diagnosis and Classification of Diabetes Mellitus,Diabetes     DJSH,7026,37(CHYIF 1):S62-S69 and Standards of Medical Care in             Diabetes - 2011,Diabetes Care,2011,34 (Suppl 1):S11-S61.   Mean Plasma Glucose 137 (*) <117 mg/dL   Comment: Performed at [facility]  CBC WITH DIFFERENTIAL     Status: Abnormal   Collection Time    01/14/14 10:30 AM      Result Value Ref Range   WBC 5.3  4.0 - 10.5 K/uL   RBC 2.65 (*) 4.22 - 5.81 MIL/uL   Hemoglobin 8.0 (*) 13.0 - 17.0 g/dL   HCT 24.5 (*) 39.0 - 52.0 %   MCV 92.5  78.0 - 100.0 fL   MCH 30.2  26.0 - 34.0 pg   MCHC 32.7  30.0 - 36.0 g/dL   RDW 17.5 (*) 11.5 - 15.5 %   Platelets 56 (*) 150 - 400 K/uL   Comment:  REPEATED TO VERIFY   Neutrophils Relative % 88 (*) 43 - 77 %   Neutro Abs 4.7  1.7 - 7.7 K/uL   Lymphocytes Relative 11 (*) 12 - 46 %   Lymphs Abs 0.6 (*) 0.7 - 4.0 K/uL   Monocytes Relative 1 (*) 3 - 12 %   Monocytes Absolute 0.1  0.1 - 1.0 K/uL   Eosinophils Relative 0  0 - 5 %   Eosinophils Absolute 0.0  0.0 - 0.7 K/uL   Basophils Relative 0  0 - 1 %   Basophils Absolute 0.0  0.0 - 0.1 K/uL  BASIC METABOLIC PANEL     Status: Abnormal   Collection Time    01/14/14 12:27 PM      Result Value Ref Range   Sodium 136 (*) 137 - 147 mEq/L   Potassium 5.9 (*) 3.7 - 5.3 mEq/L   Chloride 106  96 - 112 mEq/L   CO2 17 (*) 19 - 32 mEq/L   Glucose, Bld 221 (*) 70 - 99 mg/dL   BUN 50 (*) 6 - 23 mg/dL  Creatinine, Ser 2.02 (*) 0.50 - 1.35 mg/dL   Calcium 7.5 (*) 8.4 - 10.5 mg/dL   GFR calc non Af Amer 35 (*) >90 mL/min   GFR calc Af Amer 40 (*) >90 mL/min   Comment: (NOTE)     The eGFR has been calculated using the CKD EPI equation.     This calculation has not been validated in all clinical situations.     eGFR's persistently <90 mL/min signify possible Chronic Kidney     Disease.   Anion gap 13  5 - 15  CBG MONITORING, ED     Status: Abnormal   Collection Time    01/14/14 12:47 PM      Result Value Ref Range   Glucose-Capillary 197 (*) 70 - 99 mg/dL  GLUCOSE, RANDOM     Status: Abnormal   Collection Time    01/14/14  2:51 PM      Result Value Ref Range   Glucose, Bld 198 (*) 70 - 99 mg/dL  CBG MONITORING, ED     Status: Abnormal   Collection Time    01/14/14  2:51 PM      Result Value Ref Range   Glucose-Capillary 186 (*) 70 - 99 mg/dL  POTASSIUM     Status: Abnormal   Collection Time    01/14/14  4:14 PM      Result Value Ref Range   Potassium 6.1 (*) 3.7 - 5.3 mEq/L  MRSA PCR SCREENING     Status: None   Collection Time    01/14/14  4:52 PM      Result Value Ref Range   MRSA by PCR NEGATIVE  NEGATIVE   Comment:            The GeneXpert MRSA Assay (FDA     approved  for NASAL specimens     only), is one component of a     comprehensive MRSA colonization     surveillance program. It is not     intended to diagnose MRSA     infection nor to guide or     monitor treatment for     MRSA infections.  GLUCOSE, CAPILLARY     Status: Abnormal   Collection Time    01/14/14  4:56 PM      Result Value Ref Range   Glucose-Capillary 196 (*) 70 - 99 mg/dL   Comment 1 Notify RN     Comment 2 Documented in Chart    URINALYSIS, ROUTINE W REFLEX MICROSCOPIC     Status: Abnormal   Collection Time    01/14/14  5:00 PM      Result Value Ref Range   Color, Urine AMBER (*) YELLOW   Comment: BIOCHEMICALS MAY BE AFFECTED BY COLOR   APPearance CLEAR  CLEAR   Specific Gravity, Urine 1.018  1.005 - 1.030   pH 5.0  5.0 - 8.0   Glucose, UA NEGATIVE  NEGATIVE mg/dL   Hgb urine dipstick NEGATIVE  NEGATIVE   Bilirubin Urine NEGATIVE  NEGATIVE   Ketones, ur 15 (*) NEGATIVE mg/dL   Protein, ur NEGATIVE  NEGATIVE mg/dL   Urobilinogen, UA 0.2  0.0 - 1.0 mg/dL   Nitrite NEGATIVE  NEGATIVE   Leukocytes, UA NEGATIVE  NEGATIVE   Comment: MICROSCOPIC NOT DONE ON URINES WITH NEGATIVE PROTEIN, BLOOD, LEUKOCYTES, NITRITE, OR GLUCOSE <1000 mg/dL.  GLUCOSE, CAPILLARY     Status: Abnormal   Collection Time    01/14/14  9:45 PM  Result Value Ref Range   Glucose-Capillary 157 (*) 70 - 99 mg/dL   Comment 1 Documented in Chart     Comment 2 Notify RN    POTASSIUM     Status: Abnormal   Collection Time    01/14/14 11:26 PM      Result Value Ref Range   Potassium 5.8 (*) 3.7 - 5.3 mEq/L  CBC     Status: Abnormal   Collection Time    01/14/14 11:30 PM      Result Value Ref Range   WBC 7.2  4.0 - 10.5 K/uL   RBC 2.52 (*) 4.22 - 5.81 MIL/uL   Hemoglobin 7.9 (*) 13.0 - 17.0 g/dL   HCT 22.8 (*) 39.0 - 52.0 %   MCV 90.5  78.0 - 100.0 fL   MCH 31.3  26.0 - 34.0 pg   MCHC 34.6  30.0 - 36.0 g/dL   RDW 17.5 (*) 11.5 - 15.5 %   Platelets 55 (*) 150 - 400 K/uL   Comment: CONSISTENT  WITH PREVIOUS RESULT  COMPREHENSIVE METABOLIC PANEL     Status: Abnormal   Collection Time    01/15/14  3:04 AM      Result Value Ref Range   Sodium 139  137 - 147 mEq/L   Potassium 5.2  3.7 - 5.3 mEq/L   Chloride 108  96 - 112 mEq/L   CO2 19  19 - 32 mEq/L   Glucose, Bld 162 (*) 70 - 99 mg/dL   BUN 57 (*) 6 - 23 mg/dL   Creatinine, Ser 2.16 (*) 0.50 - 1.35 mg/dL   Calcium 7.3 (*) 8.4 - 10.5 mg/dL   Total Protein 4.9 (*) 6.0 - 8.3 g/dL   Albumin 2.3 (*) 3.5 - 5.2 g/dL   AST 98 (*) 0 - 37 U/L   ALT 39  0 - 53 U/L   Alkaline Phosphatase 63  39 - 117 U/L   Total Bilirubin 1.6 (*) 0.3 - 1.2 mg/dL   GFR calc non Af Amer 32 (*) >90 mL/min   GFR calc Af Amer 37 (*) >90 mL/min   Comment: (NOTE)     The eGFR has been calculated using the CKD EPI equation.     This calculation has not been validated in all clinical situations.     eGFR's persistently <90 mL/min signify possible Chronic Kidney     Disease.   Anion gap 12  5 - 15  GLUCOSE, CAPILLARY     Status: Abnormal   Collection Time    01/15/14  7:19 AM      Result Value Ref Range   Glucose-Capillary 128 (*) 70 - 99 mg/dL   Comment 1 Notify RN     Comment 2 Documented in Chart    POTASSIUM     Status: None   Collection Time    01/15/14  8:30 AM      Result Value Ref Range   Potassium 5.3  3.7 - 5.3 mEq/L  GLUCOSE, CAPILLARY     Status: Abnormal   Collection Time    01/15/14 11:17 AM      Result Value Ref Range   Glucose-Capillary 176 (*) 70 - 99 mg/dL   Comment 1 Notify RN     Comment 2 Documented in Chart    POTASSIUM     Status: None   Collection Time    01/15/14 11:20 AM      Result Value Ref Range   Potassium 5.3  3.7 - 5.3 mEq/L  RETICULOCYTES     Status: Abnormal   Collection Time    01/15/14  1:20 PM      Result Value Ref Range   Retic Ct Pct 3.2 (*) 0.4 - 3.1 %   RBC. 2.51 (*) 4.22 - 5.81 MIL/uL   Retic Count, Manual 80.3  19.0 - 186.0 K/uL  CREATININE, URINE, RANDOM     Status: None   Collection Time     01/15/14  2:00 PM      Result Value Ref Range   Creatinine, Urine 84.39    URINALYSIS, ROUTINE W REFLEX MICROSCOPIC     Status: Abnormal   Collection Time    01/15/14  2:00 PM      Result Value Ref Range   Color, Urine YELLOW  YELLOW   APPearance CLEAR  CLEAR   Specific Gravity, Urine 1.017  1.005 - 1.030   pH 6.0  5.0 - 8.0   Glucose, UA NEGATIVE  NEGATIVE mg/dL   Hgb urine dipstick SMALL (*) NEGATIVE   Bilirubin Urine NEGATIVE  NEGATIVE   Ketones, ur NEGATIVE  NEGATIVE mg/dL   Protein, ur NEGATIVE  NEGATIVE mg/dL   Urobilinogen, UA 0.2  0.0 - 1.0 mg/dL   Nitrite NEGATIVE  NEGATIVE   Leukocytes, UA SMALL (*) NEGATIVE  SODIUM, URINE, RANDOM     Status: None   Collection Time    01/15/14  2:00 PM      Result Value Ref Range   Sodium, Ur 98    URINE MICROSCOPIC-ADD ON     Status: None   Collection Time    01/15/14  2:00 PM      Result Value Ref Range   WBC, UA 0-2  <3 WBC/hpf   RBC / HPF 0-2  <3 RBC/hpf    US Renal  01/15/2014   CLINICAL DATA:  Acute renal failure  EXAM: RENAL/URINARY TRACT ULTRASOUND COMPLETE  COMPARISON:  August 12, 2013  FINDINGS: Right Kidney:  Length: 13.7 cm. Echogenicity and renal cortical thickness are within normal limits. No mass, perinephric fluid, or hydronephrosis visualized. No sonographically demonstrable calculus or ureterectasis.  Left Kidney:  Length: 12.2 cm. Echogenicity and renal cortical thickness are within normal limits. No mass, perinephric fluid, or hydronephrosis visualized. No sonographically demonstrable calculus or ureterectasis.  Bladder:  Appears normal for degree of bladder distention.  IMPRESSION: Study within normal limits.   Electronically Signed   By: Lowella Grip M.D.   On: 01/15/2014 14:34   Dg Chest Port 1 View  01/14/2014   CLINICAL DATA:  Hematemesis.  Wheezing.  History of COPD.  EXAM: PORTABLE CHEST - 1 VIEW  COMPARISON:  11/17/2013.  FINDINGS: Poor inspiration. Grossly normal sized heart. Clear lungs. Minimal central  peribronchial thickening. Unremarkable bones.  IMPRESSION: Minimal bronchitic changes.   Electronically Signed   By: Enrique Sack M.D.   On: 01/14/2014 02:17   ROS: patient reports that he has had colonoscopy but he is unable to tell me when it was done, who performed the procedure, or what the results were.           Blood pressure 132/63, pulse 69, temperature 98 F (36.7 C), temperature source Oral, resp. rate 14, height _0  (1.778 m), weight 111.4 kg (245 lb 9.5 oz), SpO2 100.00%.  Physical exam:   General-- anxious white male nonicteric. Heart-- regular rate and rhythm without murmurs are gallops Lungs--clear  Abdomen-- soft and nontender   Assessment: 1. Acute G.I. bleeding.  This could be variceal but he currently does not appear to be having a variceal bleed. Will need to check on the results of his previous colonoscopy. 2. 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 The patient reports that he has not had alcohol for 1 1/2 years. Hopefully this is the case. His liver tests to indicate some impairment. He has apparently had ascites in the past. This platelet count as low consistent with cirrhosis. 3. Type I diabetes 4. Chronic hepatic encephalopathy  Plan: 1. The patient seems quite alert now. He remembers seeing me in the hospital in the past. I think it would be reasonable to go ahead with EGD with possible banding of varices of indicate. 2. We will place him on IV Cipro. Systemic antibiotics have been shown to reduce encephalopathy in the face of G.I. bleeding.   Taffany Heiser JR,Lovis More L 01/15/2014, 3:36 PM

## 2014-01-16 NOTE — Progress Notes (Signed)
Peripherally Inserted Central Catheter/Midline Placement  The IV Nurse has discussed with the patient and/or persons authorized to consent for the patient, the purpose of this procedure and the potential benefits and risks involved with this procedure.  The benefits include less needle sticks, lab draws from the catheter and patient may be discharged home with the catheter.  Risks include, but not limited to, infection, bleeding, blood clot (thrombus formation), and puncture of an artery; nerve damage and irregular heat beat.  Alternatives to this procedure were also discussed.  PICC/Midline Placement Documentation     Telephone consent from daughter   Murvin Natal 01/16/2014, 4:29 PM

## 2014-01-17 ENCOUNTER — Inpatient Hospital Stay (HOSPITAL_COMMUNITY): Payer: Medicare PPO | Admitting: Anesthesiology

## 2014-01-17 ENCOUNTER — Encounter (HOSPITAL_COMMUNITY): Payer: Self-pay | Admitting: Gastroenterology

## 2014-01-17 ENCOUNTER — Encounter (HOSPITAL_COMMUNITY)
Admission: EM | Disposition: A | Discharge status: Home Health | Payer: Self-pay | Source: Home / Self Care | Attending: Internal Medicine

## 2014-01-17 ENCOUNTER — Encounter (HOSPITAL_COMMUNITY): Payer: Medicare PPO | Admitting: Anesthesiology

## 2014-01-17 HISTORY — PX: COLONOSCOPY: SHX5424

## 2014-01-17 LAB — TYPE AND SCREEN
ABO/RH(D): O POS
Antibody Screen: NEGATIVE
UNIT DIVISION: 0
Unit division: 0
Unit division: 0
Unit division: 0

## 2014-01-17 LAB — CBC
HEMATOCRIT: 28 % — AB (ref 39.0–52.0)
HEMOGLOBIN: 9.5 g/dL — AB (ref 13.0–17.0)
MCH: 32 pg (ref 26.0–34.0)
MCHC: 33.9 g/dL (ref 30.0–36.0)
MCV: 94.3 fL (ref 78.0–100.0)
Platelets: 43 10*3/uL — ABNORMAL LOW (ref 150–400)
RBC: 2.97 MIL/uL — AB (ref 4.22–5.81)
RDW: 17.7 % — ABNORMAL HIGH (ref 11.5–15.5)
WBC: 4.9 10*3/uL (ref 4.0–10.5)

## 2014-01-17 LAB — COMPREHENSIVE METABOLIC PANEL
ALT: 37 U/L (ref 0–53)
AST: 72 U/L — AB (ref 0–37)
Albumin: 2.3 g/dL — ABNORMAL LOW (ref 3.5–5.2)
Alkaline Phosphatase: 66 U/L (ref 39–117)
Anion gap: 8 (ref 5–15)
BUN: 27 mg/dL — ABNORMAL HIGH (ref 6–23)
CALCIUM: 7.5 mg/dL — AB (ref 8.4–10.5)
CO2: 22 mEq/L (ref 19–32)
Chloride: 105 mEq/L (ref 96–112)
Creatinine, Ser: 1.02 mg/dL (ref 0.50–1.35)
GFR calc Af Amer: >90 mL/min (ref >90)
GFR calc non Af Amer: 79 mL/min — ABNORMAL LOW (ref >90)
Glucose, Bld: 97 mg/dL (ref 70–99)
POTASSIUM: 4.7 meq/L (ref 3.7–5.3)
SODIUM: 135 meq/L — AB (ref 137–147)
TOTAL PROTEIN: 5.1 g/dL — AB (ref 6.0–8.3)
Total Bilirubin: 4.2 mg/dL — ABNORMAL HIGH (ref 0.3–1.2)

## 2014-01-17 LAB — GLUCOSE, CAPILLARY
GLUCOSE-CAPILLARY: 105 mg/dL — AB (ref 70–99)
GLUCOSE-CAPILLARY: 205 mg/dL — AB (ref 70–99)
Glucose-Capillary: 87 mg/dL (ref 70–99)
Glucose-Capillary: 124 mg/dL — ABNORMAL HIGH (ref 70–99)

## 2014-01-17 SURGERY — COLONOSCOPY
Anesthesia: Monitor Anesthesia Care

## 2014-01-17 SURGERY — COLONOSCOPY
Anesthesia: Moderate Sedation

## 2014-01-17 MED ORDER — PROPOFOL INFUSION 10 MG/ML OPTIME
INTRAVENOUS | Status: DC | PRN
  Administered 2014-01-17: 50 ug/kg/min via INTRAVENOUS

## 2014-01-17 MED ORDER — PEG 3350-KCL-NA BICARB-NACL 420 G PO SOLR
4000.0000 mL | Freq: Once | ORAL | Status: AC
  Administered 2014-01-17: 4000 mL via ORAL
  Filled 2014-01-17: qty 4000

## 2014-01-17 MED ORDER — BISACODYL 5 MG PO TBEC
10.0000 mg | DELAYED_RELEASE_TABLET | Freq: Once | ORAL | Status: AC
  Administered 2014-01-17: 10 mg via ORAL
  Filled 2014-01-17: qty 2

## 2014-01-17 MED ORDER — PROPOFOL 10 MG/ML IV BOLUS
INTRAVENOUS | Status: DC | PRN
  Administered 2014-01-17: 20 mg via INTRAVENOUS
  Administered 2014-01-17: 50 mg via INTRAVENOUS

## 2014-01-17 NOTE — Op Note (Signed)
[facility] [place], [id number]   COLONOSCOPY PROCEDURE REPORT  PATIENT: [name], [name]  MR#: [id number] BIRTHDATE: 20-May-1956 , 42  yrs. old GENDER: Male ENDOSCOPIST: Laurence Spates, MD REFERRED BY:   [facility] PROCEDURE DATE:  01/17/2014 PROCEDURE:   Colonoscopy ASA CLASS:  class III INDICATIONS:  patient with known cirrhosis with G.I. bleeding. EGD showed portal gastropathy minimal varices no sign of upper G.I. bleeding. Patient has not had colonoscopy recently. MEDICATIONS: MAC. Propofol 120 mg  DESCRIPTION OF PROCEDURE: The procedure had been explained to the patient and consider obtain. The left lateral decubitus position digital rectal exam was performed. The Pentax adult: the scope was inserted in advanced. The prep was fair. The patient had been delayed in getting his colonoscopy prep so he did receive enemas this morning as well. The scope was passed to the right:colon. He had a very long and tortuous colon requiring multiple position changes of abdominal pressure. We were never able to reach the tip of the cecum but were able to pass to the level of the ileocecal valve. 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 The scope was withdrawn. The right:, transverse, descending and sigmoid colon world seen well with no further AVMs diverticulosis, polyps or masses. Direct was basically normal but did have some stool present. The patient tolerated procedure well.     COMPLICATIONS: None  ENDOSCOPIC IMPRESSION: 1. 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  RECOMMENDATIONS: 1. would go ahead and feed the patient. 2. Would continue antibiotics for 24 more hours to avoid SBP 3. Discharge when stable and follow-up with Dr. Paulita Fujita.    _______________________________ eSigned:  Laurence Spates, MD 01/17/2014 1:46 PM

## 2014-01-17 NOTE — Progress Notes (Addendum)
Apparently when meds were reconciled yesterday they were duplicated and pharmacy called to remove orders but colon prep was also removed Will give ASAP and use tap water enemas as well.  Will try to do at 1 with propofol.

## 2014-01-17 NOTE — Anesthesia Postprocedure Evaluation (Signed)
  Anesthesia Post-op Note  Patient: [name]  Procedure(s) Performed: Procedure(s) with comments: COLONOSCOPY (N/A) - possible banding  Patient Location: Endoscopy Unit  Anesthesia Type:MAC  Level of Consciousness: awake, alert , oriented and patient cooperative  Airway and Oxygen Therapy: Patient Spontanous Breathing  Post-op Pain: none  Post-op Assessment: Post-op Vital signs reviewed, Patient's Cardiovascular Status Stable, Respiratory Function Stable, Patent Airway, No signs of Nausea or vomiting and Pain level controlled  Post-op Vital Signs: Reviewed and stable  Last Vitals:  Filed Vitals:   01/17/14 1355  BP: 164/65  Pulse:   Temp:   Resp: 17    Complications: No apparent anesthesia complications

## 2014-01-17 NOTE — Anesthesia Preprocedure Evaluation (Addendum)
Anesthesia Evaluation  Patient identified by MRN, date of birth, ID band Patient awake    Reviewed: Allergy & Precautions, H&P , NPO status , Patient's Chart, lab work & pertinent test results  History of Anesthesia Complications Negative for: history of anesthetic complications  Airway Mallampati: II TM Distance: >3 FB Neck ROM: Full    Dental  (+) Poor Dentition, Loose, Missing, Chipped, Dental Advisory Given   Pulmonary COPD COPD inhaler, Current Smoker,  breath sounds clear to auscultation        Cardiovascular hypertension, Pt. on medications and Pt. on home beta blockers - anginaRhythm:Regular Rate:Normal  '10 ECHO: normal   Neuro/Psych TIA   GI/Hepatic PUD, GERD-  Controlled,(+) Cirrhosis - (hepatic encephalopathy)    substance abuse  cocaine use, Elevated LFTs   Endo/Other  diabetes (glu 87), Insulin DependentMorbid obesity  Renal/GU Renal disease (recent ATN)     Musculoskeletal   Abdominal (+) + obese,   Peds  Hematology  (+) Blood dyscrasia (Hb 9.5, plt 43K), anemia ,   Anesthesia Other Findings   Reproductive/Obstetrics                        Anesthesia Physical Anesthesia Plan  ASA: IV  Anesthesia Plan: MAC   Post-op Pain Management:    Induction:   Airway Management Planned: Natural Airway and Simple Face Mask  Additional Equipment:   Intra-op Plan:   Post-operative Plan:   Informed Consent: I have reviewed the patients History and Physical, chart, labs and discussed the procedure including the risks, benefits and alternatives for the proposed anesthesia with the patient or authorized representative who has indicated his/her understanding and acceptance.   Dental advisory given  Plan Discussed with: CRNA and Surgeon  Anesthesia Plan Comments: (Plan routine monitors, MAC)        Anesthesia Quick Evaluation

## 2014-01-17 NOTE — Progress Notes (Signed)
Patient Demographics  [name], is a 58 y.o. male, DOB - Aug 08, 1955, VOJ:[id number]  Admit date - 01/13/2014   Admitting Physician Waldemar Dickens, MD  Outpatient Primary MD for the patient is Barbette Merino, MD  LOS - 4   Chief Complaint  Patient presents with  . GI Bleeding        Subjective:   [name] today has, No headache, No chest pain, No abdominal pain - No Nausea, No new weakness tingling or numbness, No Cough - SOB. Post EGD colonoscopy. Mentation clear  Assessment & Plan    1. Hepatic encephalopathy. Due to missed lactulose, currently on Xifaxan and lactulose , improving ammonia and mentation. GI following. On SBP prophylaxis per GI with Cipro.   2. Alcoholic cirrhosis. Esophageal varices. GI saw the patient with EGD confirming varices but no upper GI bleed, supportive care as in #1 above. Patient says he has not had alcohol for 1-1/2 years, encouraged to continue abstaining.   3.GI bleed with acute anemia. EGD shows varices but no upper GI bleeding source, IV of her tight stopped by GI, has received 2 units of packed RBC transfusion before and will give 2 more units on 01/16/2014, continue PPI, continue Cipro for SBP prophylaxis, GI following, anoscopy on 01/17/2014 did not show any acute bleed but some AVMs. H&H now stable..   4. ARF. Likely due to dehydration and anemia. Creatinine improved and ARF resolved with fluids and transfusion, we'll transfuse 2 more units on 01/09/2014 and monitor BMP.   5. Pre DM - SSI  Lab Results  Component Value Date   HGBA1C 6.4* 01/14/2014    CBG (last 3)   Recent Labs  01/16/14 2127 01/17/14 0750 01/17/14 1159  GLUCAP 111* 87 124*     6.H.O CVA - no FN deficits, supportive care.     Code Status: Full  Family  Communication: Daughter bedside  Disposition Plan: Home with daughter   Procedures renal ultrasound, EGD, colonoscopy   Consults GI [facility]   Medications  Scheduled Meds: . sodium chloride   Intravenous Once  . ciprofloxacin  400 mg Intravenous Q12H  . insulin aspart  0-15 Units Subcutaneous TID WC  . insulin aspart  0-5 Units Subcutaneous QHS  . lactulose  30 g Oral TID  . propranolol  40 mg Oral BID  . rifaximin  550 mg Oral BID  . sodium chloride  10-40 mL Intracatheter Q12H  . sodium chloride  3 mL Intravenous Q12H   Continuous Infusions: . albuterol Stopped (01/14/14 1333)   PRN Meds:.ALPRAZolam, ipratropium-albuterol, morphine injection, ondansetron (ZOFRAN) IV, oxyCODONE, sodium chloride  DVT Prophylaxis   SCDs    Lab Results  Component Value Date   PLT 43* 01/17/2014    Antibiotics   Anti-infectives   Start     Dose/Rate Route Frequency Ordered Stop   01/15/14 1630  ciprofloxacin (CIPRO) IVPB 400 mg     400 mg 200 mL/hr over 60 Minutes Intravenous Every 12 hours 01/15/14 1548     01/14/14 1100  rifaximin (XIFAXAN) tablet 550 mg     550 mg Oral 2 times daily 01/14/14 0417     01/14/14 0000  cefTRIAXone (ROCEPHIN) 1 g in dextrose 5 % 50 mL IVPB  1 g 100 mL/hr over 30 Minutes Intravenous  Once 01/13/14 2356 01/14/14 0153          Objective:   Filed Vitals:   01/17/14 0439 01/17/14 0748 01/17/14 1203 01/17/14 1237  BP:    163/84  Pulse:    59  Temp:  98 F (36.7 C) 97.8 F (36.6 C) 98.1 F (36.7 C)  TempSrc:  Oral Oral Oral  Resp:    15  Height:      Weight:      SpO2: 97%   97%    Wt Readings from Last 3 Encounters:  01/14/14 111.4 kg (245 lb 9.5 oz)  01/14/14 111.4 kg (245 lb 9.5 oz)  01/14/14 111.4 kg (245 lb 9.5 oz)     Intake/Output Summary (Last 24 hours) at 01/17/14 1340 Last data filed at 01/17/14 1335  Gross per 24 hour  Intake 1387.5 ml  Output   3950 ml  Net -2562.5 ml     Physical Exam  Awake Alert, Oriented X  2, No new F.N deficits, Normal affect [place].AT,PERRAL Supple Neck,No JVD, No cervical lymphadenopathy appriciated.  Symmetrical Chest wall movement, Good air movement bilaterally, CTAB RRR,No Gallops,Rubs or new Murmurs, No Parasternal Heave +ve B.Sounds, Abd Soft, No tenderness, No organomegaly appriciated, No rebound - guarding or rigidity. No Cyanosis, Clubbing or edema, No new Rash or bruise      Data Review   Micro Results Recent Results (from the past 240 hour(s))  MRSA PCR SCREENING     Status: None   Collection Time    01/14/14  4:52 PM      Result Value Ref Range Status   MRSA by PCR NEGATIVE  NEGATIVE Final   Comment:            The GeneXpert MRSA Assay (FDA     approved for NASAL specimens     only), is one component of a     comprehensive MRSA colonization     surveillance program. It is not     intended to diagnose MRSA     infection nor to guide or     monitor treatment for     MRSA infections.  URINE CULTURE     Status: None   Collection Time    01/15/14  2:00 PM      Result Value Ref Range Status   Specimen Description URINE, CATHETERIZED   Final   Special Requests NONE   Final   Culture  Setup Time     Final   Value: 01/15/2014 20:51     Performed at [facility]     Final   Value: NO GROWTH     Performed at [facility]   Culture     Final   Value: NO GROWTH     Performed at [facility]   Report Status 01/16/2014 FINAL   Final    Radiology Reports US Renal  01/15/2014   CLINICAL DATA:  Acute renal failure  EXAM: RENAL/URINARY TRACT ULTRASOUND COMPLETE  COMPARISON:  August 12, 2013  FINDINGS: Right Kidney:  Length: 13.7 cm. Echogenicity and renal cortical thickness are within normal limits. No mass, perinephric fluid, or hydronephrosis visualized. No sonographically demonstrable calculus or ureterectasis.  Left Kidney:  Length: 12.2 cm. Echogenicity and renal cortical thickness are within normal limits. No mass,  perinephric fluid, or hydronephrosis visualized. No sonographically demonstrable calculus or ureterectasis.  Bladder:  Appears normal for degree of bladder  distention.  IMPRESSION: Study within normal limits.   Electronically Signed   By: Lowella Grip M.D.   On: 01/15/2014 14:34   EGD by Dr. Oletta Lamas 01/16/2014  ENDOSCOPIC IMPRESSION:  1. Mild Varices in Esophagus. these flattened completely with insuflation of air. There are no signs to indicate recent variceal bleed this really is a very minimal fine.  2. Moderate portal gastropathy in the antrum. No signs of recent bleeding here.  3. Positive stool and anemia. Have reviewed the patient's records and cannot find any recent colonoscopy. Think he should have colonoscopy performed while he is here in the hospital.   RECOMMENDATIONS:   1. At this point we will start him all clinical liquids and will stop the octreotide infusion  2. We will arrange colonoscopy tomorrow.   Colonoscopy by Dr. Oletta Lamas  01/17/2014     CBC  Recent Labs Lab 01/13/14 2348 01/14/14 1030 01/14/14 2330 01/16/14 0334 01/17/14 0300  WBC 10.3 5.3 7.2 5.9 4.9  HGB 7.1* 8.0* 7.9* 7.3* 9.5*  HCT 20.9* 24.5* 22.8* 22.1* 28.0*  PLT 97* 56* 55* 52* 43*  MCV 95.0 92.5 90.5 94.4 94.3  MCH 32.3 30.2 31.3 31.2 32.0  MCHC 34.0 32.7 34.6 33.0 33.9  RDW 16.2* 17.5* 17.5* 18.0* 17.7*  LYMPHSABS  --  0.6*  --   --   --   MONOABS  --  0.1  --   --   --   EOSABS  --  0.0  --   --   --   BASOSABS  --  0.0  --   --   --     Chemistries   Recent Labs Lab 01/13/14 2348 01/14/14 1030 01/14/14 1227 01/14/14 1451  01/15/14 0304 01/15/14 0830 01/15/14 1120 01/16/14 0334 01/17/14 0300  NA 137 135* 136*  --   --  139  --   --  140 135*  K 6.4* 6.7* 5.9*  --   < > 5.2 5.3 5.3 4.5 4.7  CL 102 105 106  --   --  108  --   --  109 105  CO2 16* 18* 17*  --   --  19  --   --  20 22  GLUCOSE 131* 166* 221* 198*  --  162*  --   --  137* 97  BUN 39* 47* 50*  --   --   57*  --   --  45* 27*  CREATININE 1.70* 1.91* 2.02*  --   --  2.16*  --   --  1.30 1.02  CALCIUM 8.7 7.4* 7.5*  --   --  7.3*  --   --  7.0* 7.5*  AST 66* 90*  --   --   --  98*  --   --  81* 72*  ALT 27 37  --   --   --  39  --   --  38 37  ALKPHOS 67 64  --   --   --  63  --   --  63 66  BILITOT 2.0* 2.3*  --   --   --  1.6*  --   --  1.3* 4.2*  < > = values in this interval not displayed. ------------------------------------------------------------------------------------------------------------------ estimated creatinine clearance is 98.7 ml/min (by C-G formula based on Cr of 1.02). ------------------------------------------------------------------------------------------------------------------ No results found for this basename: HGBA1C,  in the last 72 hours ------------------------------------------------------------------------------------------------------------------ No results found for this basename: CHOL, HDL, LDLCALC, TRIG, CHOLHDL,  LDLDIRECT,  in the last 72 hours ------------------------------------------------------------------------------------------------------------------ No results found for this basename: TSH, T4TOTAL, FREET3, T3FREE, THYROIDAB,  in the last 72 hours ------------------------------------------------------------------------------------------------------------------  Recent Labs  01/15/14 1320  VITAMINB12 1213*  FOLATE 14.0  FERRITIN 38  TIBC 311  IRON 29*  RETICCTPCT 3.2*    Coagulation profile  Recent Labs Lab 01/13/14 2356 01/14/14 1030  INR 1.73* 1.66*    No results found for this basename: DDIMER,  in the last 72 hours  Cardiac Enzymes No results found for this basename: CK, CKMB, TROPONINI, MYOGLOBIN,  in the last 168 hours ------------------------------------------------------------------------------------------------------------------ No components found with this basename: POCBNP,      Time Spent in minutes    35   Drury Ardizzone K M.D on 01/17/2014 at 1:40 PM  Between 7am to 7pm - Pager - [phone]  After 7pm go to [email or web address] - password [email or web address]  And look for the night coverage person covering for me after hours  [facility] Office  [phone]   **Disclaimer: This note may have been dictated with voice recognition software. Similar sounding words can inadvertently be transcribed and this note may contain transcription errors which may not have been corrected upon publication of note.**

## 2014-01-17 NOTE — Evaluation (Signed)
Physical Therapy Evaluation Patient Details Name: [name] MRN: [id number] DOB: Jul 12, 1955 Today's Date: 01/17/2014   History of Present Illness  Pt is a 58 yo male with complex medical history presenting with low HgB and possible GIB bleed, work up underway.  Clinical Impression  Patient demonstrates deficits in functional mobility as indicated below. Will need continued skilled PT to address deficits and maximize function. Will see as indicated and progress as tolerated. Recommend continued assist at home and HHPT.    Follow Up Recommendations Home health PT;Supervision/Assistance - 24 hour    Equipment Recommendations  None recommended by PT (pt reports he has RW)    Recommendations for Other Services       Precautions / Restrictions Precautions Precautions: Fall Restrictions Weight Bearing Restrictions: No      Mobility  Bed Mobility Overal bed mobility: Needs Assistance Bed Mobility: Rolling;Supine to Sit;Sit to Supine Rolling: Min assist   Supine to sit: Min assist Sit to supine: Min assist   General bed mobility comments: VCs for positioning, increased time to perform   Transfers Overall transfer level: Needs assistance Equipment used: Rolling walker (2 wheeled) Transfers: Sit to/from Stand Sit to Stand: Min assist         General transfer comment: VCs for safe hand placement and positioning with use of RW, assist for stability  Ambulation/Gait Ambulation/Gait assistance: Min guard Ambulation Distance (Feet): 60 Feet Assistive device: Rolling walker (2 wheeled) Gait Pattern/deviations: Step-through pattern;Decreased stride length;Narrow base of support (Impulsive) Gait velocity: decreased Gait velocity interpretation: Below normal speed for age/gender General Gait Details: patient very impulsive with ambulation, question baseline cognition  Stairs            Wheelchair Mobility    Modified Rankin (Stroke Patients Only)       Balance  Overall balance assessment: Needs assistance   Sitting balance-Leahy Scale: Good     Standing balance support: During functional activity;Bilateral upper extremity supported Standing balance-Leahy Scale: Fair Standing balance comment: 2 incidents of posterior lean requiring min assist for stability                             Pertinent Vitals/Pain      Home Living Family/patient expects to be discharged to:: Private residence Living Arrangements: Children Available Help at Discharge: Family;Available 24 hours/day Type of Home: Apartment Home Access: Stairs to enter Entrance Stairs-Rails: Right Entrance Stairs-Number of Steps: 3 Home Layout: One level Home Equipment: None      Prior Function Level of Independence: Independent         Comments: states that he has been able to get around and do the steps     Hand Dominance   Dominant Hand: Right    Extremity/Trunk Assessment               Lower Extremity Assessment: Generalized weakness         Communication   Communication: No difficulties  Cognition Arousal/Alertness: Awake/alert Behavior During Therapy: Anxious;Impulsive Overall Cognitive Status: No family/caregiver present to determine baseline cognitive functioning                      General Comments      Exercises        Assessment/Plan    PT Assessment Patient needs continued PT services  PT Diagnosis Difficulty walking;Abnormality of gait;Generalized weakness;Acute pain   PT Problem List Decreased strength;Decreased activity tolerance;Decreased balance;Decreased mobility;Pain  PT Treatment Interventions DME instruction;Gait training;Stair training;Functional mobility training;Therapeutic activities;Therapeutic exercise;Balance training;Patient/family education   PT Goals (Current goals can be found in the Care Plan section) Acute Rehab PT Goals Patient Stated Goal: to go home PT Goal Formulation: With patient Time  For Goal Achievement: 01/31/14 Potential to Achieve Goals: Good    Frequency Min 3X/week   Barriers to discharge        Co-evaluation               End of Session   Activity Tolerance: Patient tolerated treatment well Patient left: in bed;with call bell/phone within reach;with bed alarm set Nurse Communication: Mobility status         Time: 1529-1541 plus [id number]) (12 minutes plus additional 9 minutes from initial visit (prior to proc)) PT Time Calculation (min): 21 min total time   Charges:   PT Evaluation $Initial PT Evaluation Tier I: 1 Procedure PT Treatments $Therapeutic Activity: 8-22 mins   PT G CodesDuncan Dull 01/17/2014, 3:42 PM Alben Deeds, [place] DPT  [phone]

## 2014-01-17 NOTE — Transfer of Care (Signed)
Immediate Anesthesia Transfer of Care Note  Patient: [name]  Procedure(s) Performed: Procedure(s) with comments: COLONOSCOPY (N/A) - possible banding  Patient Location: PACU and Endoscopy Unit  Anesthesia Type:MAC  Level of Consciousness: awake, sedated and patient cooperative  Airway & Oxygen Therapy: Patient Spontanous Breathing and Patient connected to face mask oxygen  Post-op Assessment: Report given to PACU RN and Post -op Vital signs reviewed and stable  Post vital signs: Reviewed and stable  Complications: No apparent anesthesia complications

## 2014-01-17 NOTE — Interval H&P Note (Signed)
History and Physical Interval Note:  01/17/2014 12:51 PM  [name]  has presented today for surgery, with the diagnosis of gi bleeding cirrhosis  The various methods of treatment have been discussed with the patient and family. After consideration of risks, benefits and other options for treatment, the patient has consented to  Procedure(s) with comments: COLONOSCOPY (N/A) - possible banding as a surgical intervention .  The patient's history has been reviewed, patient examined, no change in status, stable for surgery.  I have reviewed the patient's chart and labs.  Questions were answered to the patient's satisfaction.     [name],[name]
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
## 2014-01-18 DIAGNOSIS — M7989 Other specified soft tissue disorders: Secondary | ICD-10-CM

## 2014-01-18 DIAGNOSIS — M79609 Pain in unspecified limb: Secondary | ICD-10-CM

## 2014-01-18 LAB — COMPREHENSIVE METABOLIC PANEL
ALBUMIN: 2.3 g/dL — AB (ref 3.5–5.2)
ALK PHOS: 87 U/L (ref 39–117)
ALT: 35 U/L (ref 0–53)
ANION GAP: 9 (ref 5–15)
AST: 62 U/L — ABNORMAL HIGH (ref 0–37)
BUN: 13 mg/dL (ref 6–23)
CALCIUM: 7.6 mg/dL — AB (ref 8.4–10.5)
CO2: 22 mEq/L (ref 19–32)
CREATININE: 0.86 mg/dL (ref 0.50–1.35)
Chloride: 104 mEq/L (ref 96–112)
GFR calc non Af Amer: >90 mL/min (ref >90)
Glucose, Bld: 214 mg/dL — ABNORMAL HIGH (ref 70–99)
POTASSIUM: 4.3 meq/L (ref 3.7–5.3)
Sodium: 135 mEq/L — ABNORMAL LOW (ref 137–147)
TOTAL PROTEIN: 4.9 g/dL — AB (ref 6.0–8.3)
Total Bilirubin: 2 mg/dL — ABNORMAL HIGH (ref 0.3–1.2)

## 2014-01-18 LAB — CBC WITH DIFFERENTIAL/PLATELET
BASOS PCT: 0 % (ref 0–1)
Basophils Absolute: 0 10*3/uL (ref 0.0–0.1)
EOS PCT: 12 % — AB (ref 0–5)
Eosinophils Absolute: 0.8 10*3/uL — ABNORMAL HIGH (ref 0.0–0.7)
HEMATOCRIT: 30.3 % — AB (ref 39.0–52.0)
HEMOGLOBIN: 10.1 g/dL — AB (ref 13.0–17.0)
LYMPHS ABS: 1.1 10*3/uL (ref 0.7–4.0)
Lymphocytes Relative: 15 % (ref 12–46)
MCH: 30.7 pg (ref 26.0–34.0)
MCHC: 33.3 g/dL (ref 30.0–36.0)
MCV: 92.1 fL (ref 78.0–100.0)
MONO ABS: 0.9 10*3/uL (ref 0.1–1.0)
Monocytes Relative: 13 % — ABNORMAL HIGH (ref 3–12)
NEUTROS PCT: 60 % (ref 43–77)
Neutro Abs: 4.2 10*3/uL (ref 1.7–7.7)
Platelets: 59 10*3/uL — ABNORMAL LOW (ref 150–400)
RBC: 3.29 MIL/uL — AB (ref 4.22–5.81)
RDW: 17.1 % — ABNORMAL HIGH (ref 11.5–15.5)
WBC: 7 10*3/uL (ref 4.0–10.5)

## 2014-01-18 LAB — GLUCOSE, CAPILLARY
Glucose-Capillary: 265 mg/dL — ABNORMAL HIGH (ref 70–99)
Glucose-Capillary: 160 mg/dL — ABNORMAL HIGH (ref 70–99)

## 2014-01-18 LAB — PROTIME-INR
INR: 1.88 — ABNORMAL HIGH (ref 0.00–1.49)
PROTHROMBIN TIME: 21.6 s — AB (ref 11.6–15.2)

## 2014-01-18 LAB — URIC ACID: URIC ACID, SERUM: 5.3 mg/dL (ref 4.0–7.8)

## 2014-01-18 MED ORDER — LACTULOSE 10 GM/15ML PO SOLN: 30.0000 g | Freq: Every day | ORAL | Status: DC

## 2014-01-18 MED ORDER — PANTOPRAZOLE SODIUM 40 MG PO TBEC: 40.0000 mg | DELAYED_RELEASE_TABLET | Freq: Every day | ORAL | Status: DC

## 2014-01-18 MED ORDER — RIFAXIMIN 550 MG PO TABS: 550.0000 mg | ORAL_TABLET | Freq: Two times a day (BID) | ORAL | Status: DC

## 2014-01-18 MED ORDER — OXYCODONE-ACETAMINOPHEN 5-325 MG PO TABS: 1.0000 | ORAL_TABLET | Freq: Three times a day (TID) | ORAL | Status: DC | PRN

## 2014-01-18 MED ORDER — CIPROFLOXACIN HCL 500 MG PO TABS: 500.0000 mg | ORAL_TABLET | Freq: Two times a day (BID) | ORAL | Status: DC

## 2014-01-18 NOTE — Progress Notes (Addendum)
Patient ID: [name], male   DOB: 1955-07-21, 58 y.o.   MRN: [id number]  Patient feels better. Sitting up on side of the bed eating solid food. Alert. Moving bowels on Lactulose. Patient being discharged home by [facility]. Needs to f/u with Dr. Paulita Fujita in September.

## 2014-01-18 NOTE — Progress Notes (Signed)
Pt discharged home with daughter, [name]. PICC line removed, no complications. All questions answered. Pt transported to car via wheelchair with belongings.

## 2014-01-18 NOTE — Progress Notes (Signed)
Addendum - Post DC patient says L arm chronic pain much worse ++, also chronic L arm weakness, slightly tender L wrist and elbow, giving unreliable and shifting story.    Will X Ray L wrist/elbow, Uric acid level, L arm Venous US, pain control, he says pain and weakness for 2 mths but now got much worse, did not say that this am. Will follow hold DC.

## 2014-01-18 NOTE — Discharge Summary (Addendum)
[name], is a 58 y.o. male  DOB 1956-01-04  MRN [id number].  Admission date:  01/13/2014  Admitting Physician  Waldemar Dickens, MD  Discharge Date:  01/18/2014   Primary MD  Barbette Merino, MD  Recommendations for primary care physician for things to follow:   Check CBC, CMP and ammonia level in a week. Needs close outpatient GI followup.   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.2] Acute encephalopathy [348.30] Acute kidney injury [584.9] Anemia requiring transfusions [285.9] Type 2 diabetes mellitus without complication [712.45] Hypotension, unspecified hypotension type [458.9] End stage liver disease [572.8]   Discharge Diagnosis  Hepatic encephalopathy [572.2] Hyperkalemia [276.7] Alcohol abuse [305.00] Thrombocytopenia [287.5] Upper GI bleed [578.9] Anemia associated with acute blood loss [809.9] Alcoholic cirrhosis of liver with ascites [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 Neuropathy   . Diabetes mellitus   . Bipolar affect, depressed   . Hypertension   . Arthritis   . Stroke     Mini stroke about 65yrs ago  . Cirrhosis   . Alcohol abuse   . Chronic pain   . Cocaine abuse   . Muscle spasm       both legs  . Encephalopathy, hepatic   . Detached retina   . COPD (chronic obstructive pulmonary disease)     emphysema  . Bronchitis   . Barrett's esophagus   . GERD (gastroesophageal reflux disease)     has ulcer  . Anemia     Past Surgical History  Procedure Laterality Date  . Fracture surgery      Leg and arm 41yrs ago  . Esophagogastroduodenoscopy  04/04/2012    Procedure: ESOPHAGOGASTRODUODENOSCOPY (EGD);  Surgeon: Irene Shipper, MD;  Location: [facility] ENDOSCOPY;  Service: Endoscopy;  Laterality: N/A;  . Esophagogastroduodenoscopy Left 03/13/2013    Procedure: ESOPHAGOGASTRODUODENOSCOPY (EGD);  Surgeon: Arta Silence, MD;  Location[facility] ENDOSCOPY;  Service: Endoscopy;  Laterality: Left;  Marland Kitchen Eye surgery  8 months ago both eyes    cataracts both eyes, detached eye, gas pocket  . Vasectomy    . Pars plana vitrectomy Left 07/08/2013    Procedure: PARS PLANA VITRECTOMY WITH 25 GAUGE;  Surgeon: Hurman Horn, MD;  Location: [place];  Service: Ophthalmology;  Laterality: Left;  . Intraocular lens removal Left 07/08/2013    Procedure: REMOVAL OF INTRAOCULAR LENS;  Surgeon: Hurman Horn, MD;  Location: [place];  Service: Ophthalmology;  Laterality: Left;  . Placement and suture  of secondary intraocular lens Left 07/08/2013    Procedure: PLACEMENT AND SUTURE OF SECONDARY INTRAOCULAR LENS;  Surgeon: Hurman Horn, MD;  Location: [place];  Service: Ophthalmology;  Laterality: Left;  Insertion of Anterior Capsule Intraocular Lens   . Esophagogastroduodenoscopy N/A 01/16/2014    Procedure: ESOPHAGOGASTRODUODENOSCOPY (EGD);  Surgeon: Winfield Cunas., MD;  Location: [facility] ENDOSCOPY;  Service: Endoscopy;  Laterality: N/A;       History of present illness and  Hospital Course:     Kindly see H&P for history of present illness and admission details, please review complete Labs, Consult reports and Test reports for all details in brief  HPI  from the history and physical done on the day of  admission  [name] is a 58 y.o. male came to  ed 01/14/2014 with Emesis x 1 of about 300 cc at home. Very weak at baseline but getting progressively weaker lately. At baseline pt is confused about the date. Became significantly more confused than normal on Monday. Missed Lactulose dose on Thu and Friday. Took nml dose on Sat and Sun. Started Wheezing on Thursday.     Hospital Course     1. Hepatic encephalopathy. Due to missed lactulose, currently on Xifaxan and lactulose , and mentation improved. Now at baseline.    2. Alcoholic cirrhosis. Esophageal varices. Stable from this standpoint, Xifaxan and lactulose will be continued along with Aldactone, will follow with GI outpatient. Per patient he has not been drinking alcohol for the last 18 months. Encouraged to continue abstaining.     3.GI bleed with acute anemia. EGD shows varices but no upper GI bleeding source, he status 4 units of packed RBC transfusions with now stable H&H, colonoscopy revealed AVMs without any acute bleed, continue Cipro for SBP prophylaxis for 2 more days, for discharge per GI with outpatient GI followup question if he needs capsule endoscopy for small bowel evaluation. We'll place on PPI for now.    4. ARF. Likely due to dehydration and anemia. Creatinine proved and renal failure resolved with hydration and transfusion.    5. Pre DM - resume home regimen A1c was 6.4 this admission.     6.H.O CVA - no FN deficits, supportive care.     7. Chronic left arm pain and weakness. Discussed with daughter and patient both the now confirm this problem has been going on for 2 months, patient and daughter do not want to pursue any further inpatient workup, they say that patient's primary care physician is aware of this problem and he will work it up in the outpatient setting.     Discharge Condition: Stable   Follow UP  Follow-up Information   Follow up with GARBA,LAWAL, MD. Schedule an appointment as  soon as possible for a visit in 1 week.   Specialty:  Internal Medicine   Contact information:   [place]. [place] [address] [phone]       Follow up with EDWARDS JR,JAMES L, MD. Schedule an appointment as soon as possible for a visit in 1 week.   Specialty:  Gastroenterology   Contact information:   [address] [place] [address] [phone]       Follow up with [facility]. (home health physical therapy, nurse and aide)    Contact information:  [address] [place] [address] [phone]       Follow up with [facility] (wheelchair and walker)    Contact information:   [place] [address] [phone]         Discharge Instructions  and  Discharge Medications         Discharge Instructions   Discharge instructions    Complete by:  As directed   Follow with Primary MD Barbette Merino, MD in 7 days   Get CBC, CMP, 2 view Chest X ray checked  by Primary MD next visit.    Activity: As tolerated with Full fall precautions use walker/cane & assistance as needed   Disposition Home     Diet: Low protein, low carb and heart healthy. Check your Weight same time everyday, if you gain over 2 pounds, or you develop in leg swelling, experience more shortness of breath or chest pain, call your Primary MD immediately. Follow Cardiac Low Salt Diet and 1.5 lit/day fluid restriction.   On your next visit with her primary care physician please Get Medicines reviewed and adjusted.  Please request your Prim.MD to go over all Hospital Tests and Procedure/Radiological results at the follow up, please get all Hospital records sent to your Prim MD by signing hospital release before you go home.   If you experience worsening of your admission symptoms, develop shortness of breath, life threatening emergency, suicidal or homicidal thoughts you must seek medical  attention immediately by calling 911 or calling your MD immediately  if symptoms less severe.  You Must read complete instructions/literature along with all the possible adverse reactions/side effects for all the Medicines you take and that have been prescribed to you. Take any new Medicines after you have completely understood and accpet all the possible adverse reactions/side effects.   Do not drive, operating heavy machinery, perform activities at heights, swimming or participation in water activities or provide baby sitting services if your were admitted for syncope or siezures until you have seen by Primary MD or a Neurologist and advised to do so again.  Do not drive when taking Pain medications.    Do not take more than prescribed Pain, Sleep and Anxiety Medications  Special Instructions: If you have smoked or chewed Tobacco  in the last 2 yrs please stop smoking, stop any regular Alcohol  and or any Recreational drug use.  Wear Seat belts while driving.   Please note  You were cared for by a hospitalist during your hospital stay. If you have any questions about your discharge medications or the care you received while you were in the hospital after you are discharged, you can call the unit and asked to speak with the hospitalist on call if the hospitalist that took care of you is not available. Once you are discharged, your primary care physician will handle any further medical issues. Please note that NO REFILLS for any discharge medications will be authorized once you are discharged, as it is imperative that you return to your primary care physician (or establish a relationship with a primary care physician if you do not have one) for your aftercare needs so that they can reassess your need for medications and monitor your lab values.     Increase activity slowly    Complete by:  As directed             Medication List         ALPRAZolam 1 MG tablet  Commonly known as:  XANAX   Take 1 mg by mouth 2 (two) times daily as needed for anxiety.     ciprofloxacin 500 MG tablet  Commonly known as:  CIPRO  Take 1 tablet (500 mg total) by mouth 2 (two) times daily.     insulin aspart 100 UNIT/ML FlexPen  Commonly known as:  NOVOLOG  Inject 6 Units into the skin 3 (three) times daily as needed for high blood sugar.     Insulin Glargine 100 UNIT/ML Solostar Pen  Commonly known as:  LANTUS SOLOSTAR  Inject 45 Units into the skin at bedtime.     lactulose 10 GM/15ML solution  Commonly known as:  CHRONULAC  Take 45 mLs (30 g total) by mouth daily. Titrate to at least 3 BMs daily     lisinopril 20 MG tablet  Commonly known as:  PRINIVIL,ZESTRIL  Take 20 mg by mouth daily.     oxyCODONE-acetaminophen 5-325 MG per tablet  Commonly known as:  ROXICET  Take 1 tablet by mouth every 8 (eight) hours as needed for severe pain.     pantoprazole 40 MG tablet  Commonly known as:  PROTONIX  Take 1 tablet (40 mg total) by mouth daily. Switch for any other PPI at similar dose and frequency     propranolol 40 MG tablet  Commonly known as:  INDERAL  Take 40 mg by mouth 2 (two) times daily.     rifaximin 550 MG Tabs tablet  Commonly known as:  XIFAXAN  Take 1 tablet (550 mg total) by mouth 2 (two) times daily.     spironolactone 100 MG tablet  Commonly known as:  ALDACTONE  Take 100 mg by mouth daily.          Diet and Activity recommendation: See Discharge Instructions above   Consults obtained - GI   Major procedures and Radiology Reports - PLEASE review detailed and final reports for all details, in brief -   Colonoscopy  ENDOSCOPIC IMPRESSION:  1. 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  RECOMMENDATIONS:  1. would go ahead and feed the patient.  2. Would continue antibiotics for 24 more hours to avoid SBP  3. Discharge when stable and follow-up .   EGD  ENDOSCOPIC IMPRESSION:  1. Mild Varices in Esophagus. these flattened completely with insuflation of air.  There are no signs to indicate recent variceal bleed this really is a very minimal fine.  2. Moderate portal gastropathy in the antrum. No signs of recent bleeding here.  3. Positive stool and anemia. Have reviewed the patient's records and cannot find any recent colonoscopy. Think he should have colonoscopy performed while he is here in the hospital.  RECOMMENDATIONS:  1. At this point we will start him all clinical liquids and will stop the octreotide infusion  2. We will arrange colonoscopy tomorrow.    Ct Head Wo Contrast  01/02/2014   CLINICAL DATA:  History of trauma from a fall with injury to the left eyebrow.  EXAM: CT HEAD WITHOUT CONTRAST  CT CERVICAL SPINE WITHOUT CONTRAST  TECHNIQUE: Multidetector CT imaging of the head and cervical spine was performed following the standard protocol without intravenous contrast. Multiplanar CT image reconstructions of the cervical spine were also generated.  COMPARISON:  Head CT 09/12/2013.  FINDINGS: CT HEAD FINDINGS  Mild cerebral atrophy. Patchy and confluent areas of decreased attenuation are noted throughout the deep and periventricular white matter of the cerebral hemispheres bilaterally, compatible with chronic microvascular ischemic disease. No  acute intracranial abnormalities. Specifically, no evidence of acute intracranial hemorrhage, no definite findings of acute/subacute cerebral ischemia, no mass, mass effect, hydrocephalus or abnormal intra or extra-axial fluid collections. Visualized paranasal sinuses and mastoids are well pneumatized. No acute displaced skull fractures are identified.  CT CERVICAL SPINE FINDINGS  No acute displaced fractures of the cervical spine. Alignment is anatomic. Prevertebral soft tissues are normal. Visualized portions of the upper thorax are unremarkable.  IMPRESSION: 1. No evidence of significant acute traumatic injury to the skull, brain or cervical spine. 2. Mild cerebral atrophy with mild chronic ischemic changes  throughout the cerebral white matter.   Electronically Signed   By: Vinnie Langton M.D.   On: 01/02/2014 01:51   Ct Cervical Spine Wo Contrast  01/02/2014   CLINICAL DATA:  History of trauma from a fall with injury to the left eyebrow.  EXAM: CT HEAD WITHOUT CONTRAST  CT CERVICAL SPINE WITHOUT CONTRAST  TECHNIQUE: Multidetector CT imaging of the head and cervical spine was performed following the standard protocol without intravenous contrast. Multiplanar CT image reconstructions of the cervical spine were also generated.  COMPARISON:  Head CT 09/12/2013.  FINDINGS: CT HEAD FINDINGS  Mild cerebral atrophy. Patchy and confluent areas of decreased attenuation are noted throughout the deep and periventricular white matter of the cerebral hemispheres bilaterally, compatible with chronic microvascular ischemic disease. No acute intracranial abnormalities. Specifically, no evidence of acute intracranial hemorrhage, no definite findings of acute/subacute cerebral ischemia, no mass, mass effect, hydrocephalus or abnormal intra or extra-axial fluid collections. Visualized paranasal sinuses and mastoids are well pneumatized. No acute displaced skull fractures are identified.  CT CERVICAL SPINE FINDINGS  No acute displaced fractures of the cervical spine. Alignment is anatomic. Prevertebral soft tissues are normal. Visualized portions of the upper thorax are unremarkable.  IMPRESSION: 1. No evidence of significant acute traumatic injury to the skull, brain or cervical spine. 2. Mild cerebral atrophy with mild chronic ischemic changes throughout the cerebral white matter.   Electronically Signed   By: Vinnie Langton M.D.   On: 01/02/2014 01:51   US Renal  01/15/2014   CLINICAL DATA:  Acute renal failure  EXAM: RENAL/URINARY TRACT ULTRASOUND COMPLETE  COMPARISON:  August 12, 2013  FINDINGS: Right Kidney:  Length: 13.7 cm. Echogenicity and renal cortical thickness are within normal limits. No mass, perinephric fluid, or  hydronephrosis visualized. No sonographically demonstrable calculus or ureterectasis.  Left Kidney:  Length: 12.2 cm. Echogenicity and renal cortical thickness are within normal limits. No mass, perinephric fluid, or hydronephrosis visualized. No sonographically demonstrable calculus or ureterectasis.  Bladder:  Appears normal for degree of bladder distention.  IMPRESSION: Study within normal limits.   Electronically Signed   By: Lowella Grip M.D.   On: 01/15/2014 14:34   Dg Chest Port 1 View  01/14/2014   CLINICAL DATA:  Hematemesis.  Wheezing.  History of COPD.  EXAM: PORTABLE CHEST - 1 VIEW  COMPARISON:  11/17/2013.  FINDINGS: Poor inspiration. Grossly normal sized heart. Clear lungs. Minimal central peribronchial thickening. Unremarkable bones.  IMPRESSION: Minimal bronchitic changes.   Electronically Signed   By: Enrique Sack M.D.   On: 01/14/2014 02:17    Micro Results     Recent Results (from the past 240 hour(s))  MRSA PCR SCREENING     Status: None   Collection Time    01/14/14  4:52 PM      Result Value Ref Range Status   MRSA by PCR NEGATIVE  NEGATIVE Final  Comment:            The GeneXpert MRSA Assay (FDA     approved for NASAL specimens     only), is one component of a     comprehensive MRSA colonization     surveillance program. It is not     intended to diagnose MRSA     infection nor to guide or     monitor treatment for     MRSA infections.  URINE CULTURE     Status: None   Collection Time    01/15/14  2:00 PM      Result Value Ref Range Status   Specimen Description URINE, CATHETERIZED   Final   Special Requests NONE   Final   Culture  Setup Time     Final   Value: 01/15/2014 20:51     Performed at [facility]     Final   Value: NO GROWTH     Performed at [facility]   Culture     Final   Value: NO GROWTH     Performed at [facility]   Report Status 01/16/2014 FINAL   Final       Today   Subjective:    [name] today has no headache,no chest abdominal pain,no new weakness tingling or numbness, feels much better wants to go home today.    Objective:   Blood pressure 119/66, pulse 62, temperature 98.4 F (36.9 C), temperature source Oral, resp. rate 16, height 5\' 10"  (1.778 m), weight 111.4 kg (245 lb 9.5 oz), SpO2 92.00%.   Intake/Output Summary (Last 24 hours) at 01/18/14 1711 Last data filed at 01/18/14 1200  Gross per 24 hour  Intake 1672.92 ml  Output   2426 ml  Net -753.08 ml    Exam Awake Alert, Oriented x 3, No new F.N deficits, Normal affect .AT,PERRAL Supple Neck,No JVD, No cervical lymphadenopathy appriciated.  Symmetrical Chest wall movement, Good air movement bilaterally, CTAB RRR,No Gallops,Rubs or new Murmurs, No Parasternal Heave +ve B.Sounds, Abd Soft, Non tender, No organomegaly appriciated, No rebound -guarding or rigidity. No Cyanosis, Clubbing or edema, No new Rash or bruise  Data Review   CBC w Diff: Lab Results  Component Value Date   WBC 7.0 01/18/2014   WBC 3.7* 04/16/2012   HGB 10.1* 01/18/2014   HGB 10.2* 04/16/2012   HCT 30.3* 01/18/2014   HCT 33.4* 04/16/2012   PLT 59* 01/18/2014   LYMPHOPCT 15 01/18/2014   MONOPCT 13* 01/18/2014   EOSPCT 12* 01/18/2014   BASOPCT 0 01/18/2014    CMP: Lab Results  Component Value Date   NA 135* 01/18/2014   K 4.3 01/18/2014   CL 104 01/18/2014   CO2 22 01/18/2014   BUN 13 01/18/2014   CREATININE 0.86 01/18/2014   CREATININE 0.62 04/16/2012   PROT 4.9* 01/18/2014   ALBUMIN 2.3* 01/18/2014   BILITOT 2.0* 01/18/2014   ALKPHOS 87 01/18/2014   AST 62* 01/18/2014   ALT 35 01/18/2014  .   Total Time in preparing paper work, data evaluation and todays exam - 35 minutes  Thurnell Lose M.D on 01/18/2014 at 5:11 PM  [place]  [phone]   **Disclaimer: This note may have been dictated with voice recognition software. Similar sounding words can inadvertently be transcribed and this  note may contain transcription errors which may not have been corrected upon publication of note.**

## 2014-01-18 NOTE — Progress Notes (Signed)
VASCULAR LAB PRELIMINARY  PRELIMINARY  PRELIMINARY  PRELIMINARY  Left upper extremity venous Doppler completed.    Preliminary report:  There is no DVT or SVT noted in the left upper extremity.  There is interstitial fluid noted throughout left upper extremity.   Meldon Hanzlik, RVT 01/18/2014, 6:15 PM

## 2014-01-18 NOTE — Discharge Instructions (Signed)
Follow with Primary MD Barbette Merino, MD in 7 days   Get CBC, CMP, 2 view Chest X ray checked  by Primary MD next visit.    Activity: As tolerated with Full fall precautions use walker/cane & assistance as needed   Disposition Home     Diet: Low protein, low carb and heart healthy. Check your Weight same time everyday, if you gain over 2 pounds, or you develop in leg swelling, experience more shortness of breath or chest pain, call your Primary MD immediately. Follow Cardiac Low Salt Diet and 1.5 lit/day fluid restriction.   On your next visit with her primary care physician please Get Medicines reviewed and adjusted.  Please request your Prim.MD to go over all Hospital Tests and Procedure/Radiological results at the follow up, please get all Hospital records sent to your Prim MD by signing hospital release before you go home.   If you experience worsening of your admission symptoms, develop shortness of breath, life threatening emergency, suicidal or homicidal thoughts you must seek medical attention immediately by calling 911 or calling your MD immediately  if symptoms less severe.  You Must read complete instructions/literature along with all the possible adverse reactions/side effects for all the Medicines you take and that have been prescribed to you. Take any new Medicines after you have completely understood and accpet all the possible adverse reactions/side effects.   Do not drive, operating heavy machinery, perform activities at heights, swimming or participation in water activities or provide baby sitting services if your were admitted for syncope or siezures until you have seen by Primary MD or a Neurologist and advised to do so again.  Do not drive when taking Pain medications.    Do not take more than prescribed Pain, Sleep and Anxiety Medications  Special Instructions: If you have smoked or chewed Tobacco  in the last 2 yrs please stop smoking, stop any regular Alcohol  and  or any Recreational drug use.  Wear Seat belts while driving.   Please note  You were cared for by a hospitalist during your hospital stay. If you have any questions about your discharge medications or the care you received while you were in the hospital after you are discharged, you can call the unit and asked to speak with the hospitalist on call if the hospitalist that took care of you is not available. Once you are discharged, your primary care physician will handle any further medical issues. Please note that NO REFILLS for any discharge medications will be authorized once you are discharged, as it is imperative that you return to your primary care physician (or establish a relationship with a primary care physician if you do not have one) for your aftercare needs so that they can reassess your need for medications and monitor your lab values.

## 2014-01-20 ENCOUNTER — Encounter (HOSPITAL_COMMUNITY): Payer: Self-pay | Admitting: Gastroenterology

## 2014-01-26 ENCOUNTER — Inpatient Hospital Stay (HOSPITAL_COMMUNITY)
Admission: EM | Admit: 2014-01-26 | Discharge: 2014-01-29 | DRG: 442 | Disposition: A | Discharge status: Home / Self Care | Payer: Medicare PPO | Attending: Internal Medicine | Admitting: Internal Medicine

## 2014-01-26 ENCOUNTER — Inpatient Hospital Stay (HOSPITAL_COMMUNITY): Payer: Medicare PPO

## 2014-01-26 ENCOUNTER — Encounter (HOSPITAL_COMMUNITY): Payer: Self-pay | Admitting: Emergency Medicine

## 2014-01-26 DIAGNOSIS — F319 Bipolar disorder, unspecified: Secondary | ICD-10-CM | POA: Diagnosis present

## 2014-01-26 DIAGNOSIS — K7460 Unspecified cirrhosis of liver: Secondary | ICD-10-CM | POA: Diagnosis present

## 2014-01-26 DIAGNOSIS — K219 Gastro-esophageal reflux disease without esophagitis: Secondary | ICD-10-CM | POA: Diagnosis present

## 2014-01-26 DIAGNOSIS — E872 Acidosis, unspecified: Secondary | ICD-10-CM

## 2014-01-26 DIAGNOSIS — Z79899 Other long term (current) drug therapy: Secondary | ICD-10-CM

## 2014-01-26 DIAGNOSIS — F17200 Nicotine dependence, unspecified, uncomplicated: Secondary | ICD-10-CM | POA: Diagnosis present

## 2014-01-26 DIAGNOSIS — M129 Arthropathy, unspecified: Secondary | ICD-10-CM | POA: Diagnosis present

## 2014-01-26 DIAGNOSIS — G589 Mononeuropathy, unspecified: Secondary | ICD-10-CM | POA: Diagnosis present

## 2014-01-26 DIAGNOSIS — K7290 Hepatic failure, unspecified without coma: Principal | ICD-10-CM

## 2014-01-26 DIAGNOSIS — L03119 Cellulitis of unspecified part of limb: Secondary | ICD-10-CM

## 2014-01-26 DIAGNOSIS — E119 Type 2 diabetes mellitus without complications: Secondary | ICD-10-CM | POA: Diagnosis present

## 2014-01-26 DIAGNOSIS — E860 Dehydration: Secondary | ICD-10-CM | POA: Diagnosis present

## 2014-01-26 DIAGNOSIS — N179 Acute kidney failure, unspecified: Secondary | ICD-10-CM | POA: Diagnosis present

## 2014-01-26 DIAGNOSIS — Z8673 Personal history of transient ischemic attack (TIA), and cerebral infarction without residual deficits: Secondary | ICD-10-CM | POA: Diagnosis not present

## 2014-01-26 DIAGNOSIS — G8929 Other chronic pain: Secondary | ICD-10-CM | POA: Diagnosis present

## 2014-01-26 DIAGNOSIS — K7682 Hepatic encephalopathy: Principal | ICD-10-CM | POA: Diagnosis present

## 2014-01-26 DIAGNOSIS — F32A Depression, unspecified: Secondary | ICD-10-CM

## 2014-01-26 DIAGNOSIS — E162 Hypoglycemia, unspecified: Secondary | ICD-10-CM

## 2014-01-26 DIAGNOSIS — I10 Essential (primary) hypertension: Secondary | ICD-10-CM | POA: Diagnosis present

## 2014-01-26 DIAGNOSIS — E7220 Disorder of urea cycle metabolism, unspecified: Secondary | ICD-10-CM

## 2014-01-26 DIAGNOSIS — F1010 Alcohol abuse, uncomplicated: Secondary | ICD-10-CM | POA: Diagnosis present

## 2014-01-26 DIAGNOSIS — R45851 Suicidal ideations: Secondary | ICD-10-CM

## 2014-01-26 DIAGNOSIS — D649 Anemia, unspecified: Secondary | ICD-10-CM | POA: Diagnosis present

## 2014-01-26 DIAGNOSIS — Z720 Tobacco use: Secondary | ICD-10-CM

## 2014-01-26 DIAGNOSIS — D696 Thrombocytopenia, unspecified: Secondary | ICD-10-CM | POA: Diagnosis present

## 2014-01-26 DIAGNOSIS — D638 Anemia in other chronic diseases classified elsewhere: Secondary | ICD-10-CM | POA: Diagnosis present

## 2014-01-26 DIAGNOSIS — K922 Gastrointestinal hemorrhage, unspecified: Secondary | ICD-10-CM

## 2014-01-26 DIAGNOSIS — Z794 Long term (current) use of insulin: Secondary | ICD-10-CM | POA: Diagnosis not present

## 2014-01-26 DIAGNOSIS — J438 Other emphysema: Secondary | ICD-10-CM | POA: Diagnosis present

## 2014-01-26 DIAGNOSIS — F29 Unspecified psychosis not due to a substance or known physiological condition: Secondary | ICD-10-CM | POA: Diagnosis not present

## 2014-01-26 DIAGNOSIS — F329 Major depressive disorder, single episode, unspecified: Secondary | ICD-10-CM

## 2014-01-26 DIAGNOSIS — G9340 Encephalopathy, unspecified: Secondary | ICD-10-CM | POA: Diagnosis present

## 2014-01-26 DIAGNOSIS — D61818 Other pancytopenia: Secondary | ICD-10-CM

## 2014-01-26 DIAGNOSIS — R531 Weakness: Secondary | ICD-10-CM

## 2014-01-26 LAB — CBC
HEMATOCRIT: 26 % — AB (ref 39.0–52.0)
HEMOGLOBIN: 8.8 g/dL — AB (ref 13.0–17.0)
MCH: 30.3 pg (ref 26.0–34.0)
MCHC: 33.8 g/dL (ref 30.0–36.0)
MCV: 89.7 fL (ref 78.0–100.0)
Platelets: 49 10*3/uL — ABNORMAL LOW (ref 150–400)
RBC: 2.9 MIL/uL — ABNORMAL LOW (ref 4.22–5.81)
RDW: 16.8 % — ABNORMAL HIGH (ref 11.5–15.5)
WBC: 4.4 10*3/uL (ref 4.0–10.5)

## 2014-01-26 LAB — AMMONIA: Ammonia: 154 umol/L — ABNORMAL HIGH (ref 11–60)

## 2014-01-26 LAB — COMPREHENSIVE METABOLIC PANEL
ALK PHOS: 75 U/L (ref 39–117)
ALT: 23 U/L (ref 0–53)
AST: 42 U/L — ABNORMAL HIGH (ref 0–37)
Albumin: 2.4 g/dL — ABNORMAL LOW (ref 3.5–5.2)
Anion gap: 11 (ref 5–15)
BILIRUBIN TOTAL: 1.9 mg/dL — AB (ref 0.3–1.2)
BUN: 28 mg/dL — ABNORMAL HIGH (ref 6–23)
CHLORIDE: 108 meq/L (ref 96–112)
CO2: 22 mEq/L (ref 19–32)
Calcium: 8.4 mg/dL (ref 8.4–10.5)
Creatinine, Ser: 2.13 mg/dL — ABNORMAL HIGH (ref 0.50–1.35)
GFR calc non Af Amer: 32 mL/min — ABNORMAL LOW (ref >90)
GFR, EST AFRICAN AMERICAN: 38 mL/min — AB (ref >90)
GLUCOSE: 113 mg/dL — AB (ref 70–99)
POTASSIUM: 5 meq/L (ref 3.7–5.3)
Sodium: 141 mEq/L (ref 137–147)
Total Protein: 5.3 g/dL — ABNORMAL LOW (ref 6.0–8.3)

## 2014-01-26 LAB — PROTIME-INR
INR: 1.47 (ref 0.00–1.49)
Prothrombin Time: 17.8 seconds — ABNORMAL HIGH (ref 11.6–15.2)

## 2014-01-26 LAB — CBG MONITORING, ED: GLUCOSE-CAPILLARY: 132 mg/dL — AB (ref 70–99)

## 2014-01-26 NOTE — ED Provider Notes (Signed)
CSN: [id number]     Arrival date & time 01/26/14  2035 History   First MD Initiated Contact with Patient 01/26/14 2043     Chief Complaint  Patient presents with  . Altered Mental Status    The history is provided by a relative.   [name] is a 58 y.o. male with PMHS for ETOH cirrhosis with known esophageal varices (nonbleeding on EGD last week), HTN, DM, multiple admissions for hepatic encephalopathy and GI bleeding who presents via EMS for eval of AMS for past 1 day.  Family states patient had awoken this AM with confusion.  Was well the day prior.  No recent falls.  Was recently discharged from this institution for similar presentation with hepatic encephalopathy.  Family is unsure of hematemesis or bloody stool but stool had been dark.  Unsure if compliant with lactulose, medication bottle was full. No recent fever, cough or chest pain.    Past Medical History  Diagnosis Date  . Neuropathy   . Diabetes mellitus   . Bipolar affect, depressed   . Hypertension   . Arthritis   . Stroke     Mini stroke about 56yrs ago  . Cirrhosis   . Alcohol abuse   . Chronic pain   . Cocaine abuse   . Muscle spasm     both legs  . Encephalopathy, hepatic   . Detached retina   . COPD (chronic obstructive pulmonary disease)     emphysema  . Bronchitis   . Barrett's esophagus   . GERD (gastroesophageal reflux disease)     has ulcer  . Anemia    Past Surgical History  Procedure Laterality Date  . Fracture surgery      Leg and arm 34yrs ago  . Esophagogastroduodenoscopy  04/04/2012    Procedure: ESOPHAGOGASTRODUODENOSCOPY (EGD);  Surgeon: Irene Shipper, MD;  Location: [facility] ENDOSCOPY;  Service: Endoscopy;  Laterality: N/A;  . Esophagogastroduodenoscopy Left 03/13/2013    Procedure: ESOPHAGOGASTRODUODENOSCOPY (EGD);  Surgeon: Arta Silence, MD;  Location: [facility] ENDOSCOPY;  Service: Endoscopy;  Laterality: Left;  Marland Kitchen Eye surgery  8 months ago both eyes    cataracts both eyes, detached eye, gas  pocket  . Vasectomy    . Pars plana vitrectomy Left 07/08/2013    Procedure: PARS PLANA VITRECTOMY WITH 25 GAUGE;  Surgeon: Hurman Horn, MD;  Location: [place];  Service: Ophthalmology;  Laterality: Left;  . Intraocular lens removal Left 07/08/2013    Procedure: REMOVAL OF INTRAOCULAR LENS;  Surgeon: Hurman Horn, MD;  Location: [place];  Service: Ophthalmology;  Laterality: Left;  . Placement and suture of secondary intraocular lens Left 07/08/2013    Procedure: PLACEMENT AND SUTURE OF SECONDARY INTRAOCULAR LENS;  Surgeon: Hurman Horn, MD;  Location: [place];  Service: Ophthalmology;  Laterality: Left;  Insertion of Anterior Capsule Intraocular Lens   . Esophagogastroduodenoscopy N/A 01/16/2014    Procedure: ESOPHAGOGASTRODUODENOSCOPY (EGD);  Surgeon: Winfield Cunas., MD;  Location: [facility] ENDOSCOPY;  Service: Endoscopy;  Laterality: N/A;  . Colonoscopy N/A 01/17/2014    Procedure: COLONOSCOPY;  Surgeon: Winfield Cunas., MD;  Location: [facility] ENDOSCOPY;  Service: Endoscopy;  Laterality: N/A;  possible banding   Family History  Problem Relation Age of Onset  . Hypotension Mother    History  Substance Use Topics  . Smoking status: Current Every Day Smoker -- 1.00 packs/day for 30 years    Types: Cigarettes    Last Attempt to Quit: 04/06/2012  .  Smokeless tobacco: Never Used     Comment: quit   . Alcohol Use: 0.0 oz/week     Comment: 12 pk beer daily  06/2013 - no alcohol since 11/2012    Review of Systems  Unable to perform ROS: Mental status change      Allergies  Review of patient's allergies indicates no known allergies.  Home Medications   Prior to Admission medications   Medication Sig Start Date End Date Taking? Authorizing Provider  ALPRAZolam Duanne Moron) 1 MG tablet Take 1 mg by mouth 2 (two) times daily as needed for anxiety.    Historical Provider, MD  ciprofloxacin (CIPRO) 500 MG tablet Take 1 tablet (500 mg total) by mouth 2 (two) times daily. 01/18/14   Thurnell Lose, MD   insulin aspart (NOVOLOG) 100 UNIT/ML FlexPen Inject 6 Units into the skin 3 (three) times daily as needed for high blood sugar. 11/17/13   Malvin Johns, MD  Insulin Glargine (LANTUS SOLOSTAR) 100 UNIT/ML Solostar Pen Inject 45 Units into the skin at bedtime. 11/17/13   Malvin Johns, MD  lactulose (CHRONULAC) 10 GM/15ML solution Take 45 mLs (30 g total) by mouth daily. Titrate to at least 3 BMs daily 01/18/14   Thurnell Lose, MD  lisinopril (PRINIVIL,ZESTRIL) 20 MG tablet Take 20 mg by mouth daily.    Historical Provider, MD  oxyCODONE-acetaminophen (ROXICET) 5-325 MG per tablet Take 1 tablet by mouth every 8 (eight) hours as needed for severe pain. 01/18/14   Thurnell Lose, MD  pantoprazole (PROTONIX) 40 MG tablet Take 1 tablet (40 mg total) by mouth daily. Switch for any other PPI at similar dose and frequency 01/18/14   Thurnell Lose, MD  propranolol (INDERAL) 40 MG tablet Take 40 mg by mouth 2 (two) times daily.    Historical Provider, MD  rifaximin (XIFAXAN) 550 MG TABS tablet Take 1 tablet (550 mg total) by mouth 2 (two) times daily. 01/18/14   Thurnell Lose, MD  spironolactone (ALDACTONE) 100 MG tablet Take 100 mg by mouth daily.    Historical Provider, MD   SpO2 96% Physical Exam  Nursing note and vitals reviewed. Constitutional: He appears well-developed and well-nourished. No distress.  HENT:  Head: Normocephalic and atraumatic.  Nose: Nose normal.  Eyes: Conjunctivae are normal. No scleral icterus.  No conj pallor  Neck: Normal range of motion. Neck supple. No tracheal deviation present.  Cardiovascular: Normal rate, regular rhythm and normal heart sounds.   No murmur heard. Pulmonary/Chest: Effort normal and breath sounds normal. No respiratory distress. He has no rales.  Abdominal: Soft. Bowel sounds are normal. He exhibits no distension and no mass. There is no tenderness. There is no rebound.  Musculoskeletal: Normal range of motion. He exhibits no edema and no  tenderness.  Neurological:  Somnolent, awakens to physical stimulation for brief period.  Oriented to person, not place nor time.  No focal weakness. Localizes to pain.    Skin: Skin is warm and dry. No rash noted.    ED Course  Procedures (including critical care time) Labs Review Labs Reviewed  CBC - Abnormal; Notable for the following:    RBC 2.90 (*)    Hemoglobin 8.8 (*)    HCT 26.0 (*)    RDW 16.8 (*)    Platelets 49 (*)    All other components within normal limits  COMPREHENSIVE METABOLIC PANEL - Abnormal; Notable for the following:    Glucose, Bld 113 (*)    BUN 28 (*)  Creatinine, Ser 2.13 (*)    Total Protein 5.3 (*)    Albumin 2.4 (*)    AST 42 (*)    Total Bilirubin 1.9 (*)    GFR calc non Af Amer 32 (*)    GFR calc Af Amer 38 (*)    All other components within normal limits  PROTIME-INR - Abnormal; Notable for the following:    Prothrombin Time 17.8 (*)    All other components within normal limits  AMMONIA - Abnormal; Notable for the following:    Ammonia 154 (*)    All other components within normal limits  CBG MONITORING, ED - Abnormal; Notable for the following:    Glucose-Capillary 132 (*)    All other components within normal limits  OCCULT BLOOD X 1 CARD TO LAB, STOOL  AMMONIA  HEPATIC FUNCTION PANEL  BASIC METABOLIC PANEL  CBC WITH DIFFERENTIAL  TSH  SODIUM, URINE, RANDOM  CREATININE, URINE, RANDOM  PROTIME-INR    Imaging Review Ct Head Wo Contrast  01/27/2014   CLINICAL DATA:  Woke up confused and falling since this morning. History of cirrhosis. Dark stools. Altered mental status.  EXAM: CT HEAD WITHOUT CONTRAST  CT CERVICAL SPINE WITHOUT CONTRAST  TECHNIQUE: Multidetector CT imaging of the head and cervical spine was performed following the standard protocol without intravenous contrast. Multiplanar CT image reconstructions of the cervical spine were also generated.  COMPARISON:  01/02/2014  FINDINGS: CT HEAD FINDINGS  Mild diffuse cerebral  atrophy. Patchy low-attenuation changes in the deep white matter consistent with small vessel ischemia. No ventricular dilatation. No mass effect or midline shift. No abnormal extra-axial fluid collections. Gray-white matter junctions are distinct. Basal cisterns are not effaced. No evidence of acute intracranial hemorrhage. No depressed skull fractures. Visualized paranasal sinuses and mastoid air cells are not opacified.  CT CERVICAL SPINE FINDINGS  Normal alignment of the cervical spine and facet joints. No vertebral compression deformities. Intervertebral disc space heights are preserved. No prevertebral soft tissue swelling. No focal bone lesion or bone destruction. C1-2 articulation appears intact. Vascular calcifications in the cervical carotid arteries. Soft tissues are otherwise unremarkable.  IMPRESSION: No acute intracranial abnormalities.  Normal alignment of the cervical spine. No displaced fractures identified.   Electronically Signed   By: Lucienne Capers M.D.   On: 01/27/2014 00:18   Ct Cervical Spine Wo Contrast  01/27/2014   CLINICAL DATA:  Woke up confused and falling since this morning. History of cirrhosis. Dark stools. Altered mental status.  EXAM: CT HEAD WITHOUT CONTRAST  CT CERVICAL SPINE WITHOUT CONTRAST  TECHNIQUE: Multidetector CT imaging of the head and cervical spine was performed following the standard protocol without intravenous contrast. Multiplanar CT image reconstructions of the cervical spine were also generated.  COMPARISON:  01/02/2014  FINDINGS: CT HEAD FINDINGS  Mild diffuse cerebral atrophy. Patchy low-attenuation changes in the deep white matter consistent with small vessel ischemia. No ventricular dilatation. No mass effect or midline shift. No abnormal extra-axial fluid collections. Gray-white matter junctions are distinct. Basal cisterns are not effaced. No evidence of acute intracranial hemorrhage. No depressed skull fractures. Visualized paranasal sinuses and  mastoid air cells are not opacified.  CT CERVICAL SPINE FINDINGS  Normal alignment of the cervical spine and facet joints. No vertebral compression deformities. Intervertebral disc space heights are preserved. No prevertebral soft tissue swelling. No focal bone lesion or bone destruction. C1-2 articulation appears intact. Vascular calcifications in the cervical carotid arteries. Soft tissues are otherwise unremarkable.  IMPRESSION: No acute  intracranial abnormalities.  Normal alignment of the cervical spine. No displaced fractures identified.   Electronically Signed   By: Lucienne Capers M.D.   On: 01/27/2014 00:18     EKG Interpretation   Date/Time:  Sunday January 26 2014 20:47:35 EDT Ventricular Rate:  63 PR Interval:  162 QRS Duration: 90 QT Interval:  431 QTC Calculation: 441 R Axis:   41 Text Interpretation:  Sinus rhythm Abnormal R-wave progression, early  transition Confirmed by WARD,  DO, KRISTEN ([id number]) on 01/26/2014 9:00:11 PM      MDM   Final diagnoses:  Hepatic encephalopathy  AKI (acute kidney injury)  Anemia, unspecified anemia type    Complicated PMH as detailed above.  Awoke with acute encephalopathy.  FSBG wnl. Outside window for tPA, CVA on ddx but less likely given primary diagnosis more likely.  Suspect hepatic encephalopathy due to history of similar admissions and history.  VSS, afebrile, borderline hypotensive but improved without intervention.  Unsure if compliant with lactulose.  Airway protected.  Ordered hemoccult.   9:44 PM Found pt on floor.  Guardrails were up.  Unwitnessed. CT head/ neck ordered. Pt cannot be cleared by exam due to acute encephalopathy.    Labs significant for hyperammonia well above baseline, AKI, thrombocytopenia, anemia.   Failed swallow study, defer PO lactulose at this time.   Tammy Sours, MD 01/27/14 [phone]

## 2014-01-26 NOTE — ED Notes (Signed)
Resident at bedside. MD at bedside.

## 2014-01-26 NOTE — ED Provider Notes (Signed)
I saw and evaluated the patient, reviewed the resident's note and I agree with the findings and plan.   EKG Interpretation   Date/Time:  Sunday January 26 2014 20:47:35 EDT Ventricular Rate:  63 PR Interval:  162 QRS Duration: 90 QT Interval:  431 QTC Calculation: 441 R Axis:   41 Text Interpretation:  Sinus rhythm Abnormal R-wave progression, early  transition Confirmed by Janith Nielson,  DO, Marquesa Rath [id number]) on 01/26/2014 9:00:11 PM      Pt is a 58 y.o. M with history of hypertension, diabetes, alcohol and cocaine abuse, COPD, packs of arthropathy, recent admission for GI bleed from esophageal varices who presents emergency department with altered mental status. Ammonia is elevated. He has a history of noncompliance with his Robaxin and lactulose. Neuro exam is otherwise nonfocal. He failed a swallow screen, unable to give lactulose by mouth. We'll discuss with hospitalist for admission.  [place], DO 01/26/14 2340

## 2014-01-26 NOTE — ED Notes (Addendum)
Woke up confused and falling since this morning. Alert to name. Previous episode before r/t elevated ammonia levels. Hx. Of cirrhosis. Family noticed that pts. Stools are darker than normal.

## 2014-01-27 ENCOUNTER — Encounter (HOSPITAL_COMMUNITY): Payer: Self-pay | Admitting: Internal Medicine

## 2014-01-27 ENCOUNTER — Inpatient Hospital Stay (HOSPITAL_COMMUNITY): Payer: Medicare PPO

## 2014-01-27 DIAGNOSIS — D649 Anemia, unspecified: Secondary | ICD-10-CM

## 2014-01-27 DIAGNOSIS — F1010 Alcohol abuse, uncomplicated: Secondary | ICD-10-CM

## 2014-01-27 DIAGNOSIS — N179 Acute kidney failure, unspecified: Secondary | ICD-10-CM

## 2014-01-27 LAB — CREATININE, URINE, RANDOM: Creatinine, Urine: 38.07 mg/dL

## 2014-01-27 LAB — CBC WITH DIFFERENTIAL/PLATELET
Basophils Absolute: 0 10*3/uL (ref 0.0–0.1)
Basophils Relative: 1 % (ref 0–1)
Eosinophils Absolute: 0.3 10*3/uL (ref 0.0–0.7)
Eosinophils Relative: 7 % — ABNORMAL HIGH (ref 0–5)
HCT: 26.8 % — ABNORMAL LOW (ref 39.0–52.0)
Hemoglobin: 9 g/dL — ABNORMAL LOW (ref 13.0–17.0)
LYMPHS ABS: 1.1 10*3/uL (ref 0.7–4.0)
LYMPHS PCT: 27 % (ref 12–46)
MCH: 30.5 pg (ref 26.0–34.0)
MCHC: 33.6 g/dL (ref 30.0–36.0)
MCV: 90.8 fL (ref 78.0–100.0)
Monocytes Absolute: 0.4 10*3/uL (ref 0.1–1.0)
Monocytes Relative: 11 % (ref 3–12)
Neutro Abs: 2.2 10*3/uL (ref 1.7–7.7)
Neutrophils Relative %: 56 % (ref 43–77)
Platelets: 53 10*3/uL — ABNORMAL LOW (ref 150–400)
RBC: 2.95 MIL/uL — AB (ref 4.22–5.81)
RDW: 17.2 % — ABNORMAL HIGH (ref 11.5–15.5)
WBC: 4 10*3/uL (ref 4.0–10.5)

## 2014-01-27 LAB — HEPATIC FUNCTION PANEL
ALT: 24 U/L (ref 0–53)
AST: 44 U/L — ABNORMAL HIGH (ref 0–37)
Albumin: 2.5 g/dL — ABNORMAL LOW (ref 3.5–5.2)
Alkaline Phosphatase: 79 U/L (ref 39–117)
BILIRUBIN TOTAL: 2.2 mg/dL — AB (ref 0.3–1.2)
Bilirubin, Direct: 0.9 mg/dL — ABNORMAL HIGH (ref 0.0–0.3)
Indirect Bilirubin: 1.3 mg/dL — ABNORMAL HIGH (ref 0.3–0.9)
Total Protein: 5.4 g/dL — ABNORMAL LOW (ref 6.0–8.3)

## 2014-01-27 LAB — PROTIME-INR
INR: 1.59 — ABNORMAL HIGH (ref 0.00–1.49)
PROTHROMBIN TIME: 19 s — AB (ref 11.6–15.2)

## 2014-01-27 LAB — BASIC METABOLIC PANEL
Anion gap: 11 (ref 5–15)
BUN: 27 mg/dL — ABNORMAL HIGH (ref 6–23)
CHLORIDE: 109 meq/L (ref 96–112)
CO2: 23 mEq/L (ref 19–32)
Calcium: 8.6 mg/dL (ref 8.4–10.5)
Creatinine, Ser: 1.9 mg/dL — ABNORMAL HIGH (ref 0.50–1.35)
GFR calc Af Amer: 43 mL/min — ABNORMAL LOW (ref >90)
GFR, EST NON AFRICAN AMERICAN: 37 mL/min — AB (ref >90)
GLUCOSE: 95 mg/dL (ref 70–99)
POTASSIUM: 4.2 meq/L (ref 3.7–5.3)
SODIUM: 143 meq/L (ref 137–147)

## 2014-01-27 LAB — AMMONIA: AMMONIA: 67 umol/L — AB (ref 11–60)

## 2014-01-27 LAB — TSH: TSH: 1.75 u[IU]/mL (ref 0.350–4.500)

## 2014-01-27 LAB — GLUCOSE, CAPILLARY
GLUCOSE-CAPILLARY: 154 mg/dL — AB (ref 70–99)
GLUCOSE-CAPILLARY: 166 mg/dL — AB (ref 70–99)
Glucose-Capillary: 82 mg/dL (ref 70–99)

## 2014-01-27 LAB — SODIUM, URINE, RANDOM: Sodium, Ur: 59 mEq/L

## 2014-01-27 LAB — OCCULT BLOOD X 1 CARD TO LAB, STOOL: Fecal Occult Bld: NEGATIVE

## 2014-01-27 MED ORDER — INSULIN GLARGINE 100 UNIT/ML ~~LOC~~ SOLN
20.0000 [IU] | Freq: Every day | SUBCUTANEOUS | Status: DC
  Administered 2014-01-27 – 2014-01-28 (×3): 20 [IU] via SUBCUTANEOUS
  Filled 2014-01-27 (×4): qty 0.2

## 2014-01-27 MED ORDER — ONDANSETRON HCL 4 MG/2ML IJ SOLN: 4.0000 mg | Freq: Four times a day (QID) | INTRAMUSCULAR | Status: DC | PRN

## 2014-01-27 MED ORDER — THIAMINE HCL 100 MG/ML IJ SOLN
100.0000 mg | Freq: Every day | INTRAMUSCULAR | Status: DC
  Administered 2014-01-28 – 2014-01-29 (×2): 100 mg via INTRAVENOUS
  Filled 2014-01-27 (×3): qty 1

## 2014-01-27 MED ORDER — INSULIN ASPART 100 UNIT/ML ~~LOC~~ SOLN
0.0000 [IU] | Freq: Three times a day (TID) | SUBCUTANEOUS | Status: DC
  Administered 2014-01-27 – 2014-01-28 (×2): 2 [IU] via SUBCUTANEOUS
  Administered 2014-01-29: 5 [IU] via SUBCUTANEOUS

## 2014-01-27 MED ORDER — LACTULOSE ENEMA
300.0000 mL | Freq: Once | ORAL | Status: AC
  Administered 2014-01-27: 300 mL via RECTAL
  Filled 2014-01-27: qty 300

## 2014-01-27 MED ORDER — CEFOTAXIME SODIUM 1 G IJ SOLR
1.0000 g | Freq: Three times a day (TID) | INTRAMUSCULAR | Status: DC
  Administered 2014-01-27 (×2): 1 g via INTRAVENOUS
  Filled 2014-01-27 (×5): qty 1

## 2014-01-27 MED ORDER — ACETAMINOPHEN 325 MG PO TABS: 650.0000 mg | ORAL_TABLET | Freq: Four times a day (QID) | ORAL | Status: DC | PRN

## 2014-01-27 MED ORDER — LACTULOSE 10 GM/15ML PO SOLN
30.0000 g | Freq: Four times a day (QID) | ORAL | Status: DC
  Administered 2014-01-27 – 2014-01-29 (×9): 30 g via ORAL
  Filled 2014-01-27 (×12): qty 45

## 2014-01-27 MED ORDER — ACETAMINOPHEN 650 MG RE SUPP: 650.0000 mg | Freq: Four times a day (QID) | RECTAL | Status: DC | PRN

## 2014-01-27 MED ORDER — SODIUM CHLORIDE 0.9 % IV SOLN
INTRAVENOUS | Status: DC
Start: 2014-01-27 — End: 2014-01-27
  Administered 2014-01-27: 50 mL/h via INTRAVENOUS

## 2014-01-27 MED ORDER — SODIUM CHLORIDE 0.9 % IV SOLN
INTRAVENOUS | Status: DC
  Administered 2014-01-28 – 2014-01-29 (×2): via INTRAVENOUS

## 2014-01-27 MED ORDER — ONDANSETRON HCL 4 MG PO TABS
4.0000 mg | ORAL_TABLET | Freq: Four times a day (QID) | ORAL | Status: DC | PRN
Start: 2014-01-27 — End: 2014-01-29

## 2014-01-27 MED ORDER — SODIUM CHLORIDE 0.9 % IJ SOLN
3.0000 mL | Freq: Two times a day (BID) | INTRAMUSCULAR | Status: DC
  Administered 2014-01-27 – 2014-01-28 (×3): 3 mL via INTRAVENOUS

## 2014-01-27 NOTE — ED Notes (Signed)
Pt. Found sitting on floor; all monitoring equipment in place; iv intact; Dr. Present; pt. Placed back in bed; bed in trendelenburg; safety sitter at bedside. Pt. Is close to nurses station; pt. Is now a high risk for fall than moderate risk for fall.

## 2014-01-27 NOTE — H&P (Signed)
[facility] History and Physical  [name] NFA:[id number] DOB: [birth year] DOA: 01/26/2014  Referring physician: ER physician. PCP: Barbette Merino, MD   History obtained from patient's daughter and previous records.  Chief Complaint: Altered mental status.  HPI: [name] is a 58 y.o. male with history of alcoholic liver cirrhosis was brought to the ER after patient was found to be increasingly confused since morning. Patient's daughter states patient has been taking his medications as advised. Patient is resubmitted for hepatic encephalopathy and GI bleed and had EGD and colonoscopy. EGD showed varices with no active bleed and colonoscopy showed AVMs. Patient as per patient's daughter did not have any nausea vomiting abdominal pain. Has been having some dark stools. Stool for occult blood in the ER has been negative. Rectal exam were showing yellow stools. On exam patient is still confused. Patient had failed swallow evaluation and patient has been admitted for further management. CT head and neck were negative for any acute. Patient has been limited for further management.  Review of Systems: As presented in the history of presenting illness, rest negative.  Past Medical History  Diagnosis Date  . Neuropathy   . Diabetes mellitus   . Bipolar affect, depressed   . Hypertension   . Arthritis   . Stroke     Mini stroke about 22yrs ago  . Cirrhosis   . Alcohol abuse   . Chronic pain   . Cocaine abuse   . Muscle spasm     both legs  . Encephalopathy, hepatic   . Detached retina   . COPD (chronic obstructive pulmonary disease)     emphysema  . Bronchitis   . Barrett's esophagus   . GERD (gastroesophageal reflux disease)     has ulcer  . Anemia    Past Surgical History  Procedure Laterality Date  . Fracture surgery      Leg and arm 45yrs ago  . Esophagogastroduodenoscopy  04/04/2012    Procedure: ESOPHAGOGASTRODUODENOSCOPY (EGD);  Surgeon: Irene Shipper, MD;  Location:  [facility] ENDOSCOPY;  Service: Endoscopy;  Laterality: N/A;  . Esophagogastroduodenoscopy Left 03/13/2013    Procedure: ESOPHAGOGASTRODUODENOSCOPY (EGD);  Surgeon: Arta Silence, MD;  Location: [facility] ENDOSCOPY;  Service: Endoscopy;  Laterality: Left;  Marland Kitchen Eye surgery  8 months ago both eyes    cataracts both eyes, detached eye, gas pocket  . Vasectomy    . Pars plana vitrectomy Left 07/08/2013    Procedure: PARS PLANA VITRECTOMY WITH 25 GAUGE;  Surgeon: Hurman Horn, MD;  Location: [place];  Service: Ophthalmology;  Laterality: Left;  . Intraocular lens removal Left 07/08/2013    Procedure: REMOVAL OF INTRAOCULAR LENS;  Surgeon: Hurman Horn, MD;  Location: [place];  Service: Ophthalmology;  Laterality: Left;  . Placement and suture of secondary intraocular lens Left 07/08/2013    Procedure: PLACEMENT AND SUTURE OF SECONDARY INTRAOCULAR LENS;  Surgeon: Hurman Horn, MD;  Location: [place];  Service: Ophthalmology;  Laterality: Left;  Insertion of Anterior Capsule Intraocular Lens   . Esophagogastroduodenoscopy N/A 01/16/2014    Procedure: ESOPHAGOGASTRODUODENOSCOPY (EGD);  Surgeon: Winfield Cunas., MD;  Location: [facility] ENDOSCOPY;  Service: Endoscopy;  Laterality: N/A;  . Colonoscopy N/A 01/17/2014    Procedure: COLONOSCOPY;  Surgeon: Winfield Cunas., MD;  Location: [facility] ENDOSCOPY;  Service: Endoscopy;  Laterality: N/A;  possible banding   Social History:  reports that he has been smoking Cigarettes.  He has a 30 pack-year smoking history. He has never  used smokeless tobacco. He reports that he drinks alcohol. He reports that he uses illicit drugs (Cocaine). Where does patient live  home. Can patient participate in ADLs?  Unsure.  No Known Allergies  Family History:  Family History  Problem Relation Age of Onset  . Hypotension Mother       Prior to Admission medications   Medication Sig Start Date End Date Taking? Authorizing Provider  ALPRAZolam Duanne Moron) 1 MG tablet Take 1 mg by mouth 2 (two) times  daily as needed for anxiety.    Historical Provider, MD  ciprofloxacin (CIPRO) 500 MG tablet Take 1 tablet (500 mg total) by mouth 2 (two) times daily. 01/18/14   Thurnell Lose, MD  insulin aspart (NOVOLOG) 100 UNIT/ML FlexPen Inject 6 Units into the skin 3 (three) times daily as needed for high blood sugar. 11/17/13   Malvin Johns, MD  Insulin Glargine (LANTUS SOLOSTAR) 100 UNIT/ML Solostar Pen Inject 45 Units into the skin at bedtime. 11/17/13   Malvin Johns, MD  lactulose (CHRONULAC) 10 GM/15ML solution Take 45 mLs (30 g total) by mouth daily. Titrate to at least 3 BMs daily 01/18/14   Thurnell Lose, MD  lisinopril (PRINIVIL,ZESTRIL) 20 MG tablet Take 20 mg by mouth daily.    Historical Provider, MD  oxyCODONE-acetaminophen (ROXICET) 5-325 MG per tablet Take 1 tablet by mouth every 8 (eight) hours as needed for severe pain. 01/18/14   Thurnell Lose, MD  pantoprazole (PROTONIX) 40 MG tablet Take 1 tablet (40 mg total) by mouth daily. Switch for any other PPI at similar dose and frequency 01/18/14   Thurnell Lose, MD  propranolol (INDERAL) 40 MG tablet Take 40 mg by mouth 2 (two) times daily.    Historical Provider, MD  rifaximin (XIFAXAN) 550 MG TABS tablet Take 1 tablet (550 mg total) by mouth 2 (two) times daily. 01/18/14   Thurnell Lose, MD  spironolactone (ALDACTONE) 100 MG tablet Take 100 mg by mouth daily.    Historical Provider, MD    Physical Exam: Filed Vitals:   01/26/14 2100 01/26/14 2115 01/26/14 2200 01/26/14 2232  BP: 95/49 110/56 141/70 123/58  Pulse: 59 58 72 65  Resp: 18 17 16 15   SpO2: 100% 100% 100% 99%     General:  Well developed and moderately nourished.  Eyes:  Anicteric no pallor.  ENT:  No discharge from ears eyes nose mouth.  Neck:  No mass felt.  Cardiovascular:  S1-S2 heard.  Respiratory:  No rhonchi or crepitations.  Abdomen:  Distended nontender bowel sounds present. No guarding rigidity.  Skin:  Multiple chronic skin  changes.  Musculoskeletal:  Bilateral lower extremity edema.  Psychiatric:  Patient is confused.  Neurologic:  Patient is alert but confused and does not follow commands.  Labs on Admission:  Basic Metabolic Panel:  Recent Labs Lab 01/26/14 2115  NA 141  K 5.0  CL 108  CO2 22  GLUCOSE 113*  BUN 28*  CREATININE 2.13*  CALCIUM 8.4   Liver Function Tests:  Recent Labs Lab 01/26/14 2115  AST 42*  ALT 23  ALKPHOS 75  BILITOT 1.9*  PROT 5.3*  ALBUMIN 2.4*   No results found for this basename: LIPASE, AMYLASE,  in the last 168 hours  Recent Labs Lab 01/26/14 2115  AMMONIA 154*   CBC:  Recent Labs Lab 01/26/14 2115  WBC 4.4  HGB 8.8*  HCT 26.0*  MCV 89.7  PLT 49*   Cardiac Enzymes: No results found  for this basename: CKTOTAL, CKMB, CKMBINDEX, TROPONINI,  in the last 168 hours  BNP (last 3 results) No results found for this basename: PROBNP,  in the last 8760 hours CBG:  Recent Labs Lab 01/26/14 2052  GLUCAP 132*    Radiological Exams on Admission: Ct Head Wo Contrast  01/27/2014   CLINICAL DATA:  Woke up confused and falling since this morning. History of cirrhosis. Dark stools. Altered mental status.  EXAM: CT HEAD WITHOUT CONTRAST  CT CERVICAL SPINE WITHOUT CONTRAST  TECHNIQUE: Multidetector CT imaging of the head and cervical spine was performed following the standard protocol without intravenous contrast. Multiplanar CT image reconstructions of the cervical spine were also generated.  COMPARISON:  01/02/2014  FINDINGS: CT HEAD FINDINGS  Mild diffuse cerebral atrophy. Patchy low-attenuation changes in the deep white matter consistent with small vessel ischemia. No ventricular dilatation. No mass effect or midline shift. No abnormal extra-axial fluid collections. Gray-white matter junctions are distinct. Basal cisterns are not effaced. No evidence of acute intracranial hemorrhage. No depressed skull fractures. Visualized paranasal sinuses and mastoid air  cells are not opacified.  CT CERVICAL SPINE FINDINGS  Normal alignment of the cervical spine and facet joints. No vertebral compression deformities. Intervertebral disc space heights are preserved. No prevertebral soft tissue swelling. No focal bone lesion or bone destruction. C1-2 articulation appears intact. Vascular calcifications in the cervical carotid arteries. Soft tissues are otherwise unremarkable.  IMPRESSION: No acute intracranial abnormalities.  Normal alignment of the cervical spine. No displaced fractures identified.   Electronically Signed   By: Lucienne Capers M.D.   On: 01/27/2014 00:18   Ct Cervical Spine Wo Contrast  01/27/2014   CLINICAL DATA:  Woke up confused and falling since this morning. History of cirrhosis. Dark stools. Altered mental status.  EXAM: CT HEAD WITHOUT CONTRAST  CT CERVICAL SPINE WITHOUT CONTRAST  TECHNIQUE: Multidetector CT imaging of the head and cervical spine was performed following the standard protocol without intravenous contrast. Multiplanar CT image reconstructions of the cervical spine were also generated.  COMPARISON:  01/02/2014  FINDINGS: CT HEAD FINDINGS  Mild diffuse cerebral atrophy. Patchy low-attenuation changes in the deep white matter consistent with small vessel ischemia. No ventricular dilatation. No mass effect or midline shift. No abnormal extra-axial fluid collections. Gray-white matter junctions are distinct. Basal cisterns are not effaced. No evidence of acute intracranial hemorrhage. No depressed skull fractures. Visualized paranasal sinuses and mastoid air cells are not opacified.  CT CERVICAL SPINE FINDINGS  Normal alignment of the cervical spine and facet joints. No vertebral compression deformities. Intervertebral disc space heights are preserved. No prevertebral soft tissue swelling. No focal bone lesion or bone destruction. C1-2 articulation appears intact. Vascular calcifications in the cervical carotid arteries. Soft tissues are otherwise  unremarkable.  IMPRESSION: No acute intracranial abnormalities.  Normal alignment of the cervical spine. No displaced fractures identified.   Electronically Signed   By: Lucienne Capers M.D.   On: 01/27/2014 00:18     Assessment/Plan Active Problems:   Thrombocytopenia   Hepatic encephalopathy   Acute encephalopathy   Acute kidney injury   Anemia   1. Acute encephalopathy most likely secondary to hepatic encephalopathy given the elevated ammonia levels - since patient had failed swallow have ordered one dose of per rectal lactulose and further doses to be decided based on the response and patient's alertness. If patient becomes more alert than change to oral dose. Closely follow ammonia levels and patient's mental status. Since patient has failed follow most  of patient's oral medications are on hold. Patient has been empirically placed on cefotaxime for SBP coverage. 2. Acute renal failure - at this time am holding off patient's diuretics and lisinopril. Check FeNa. May need IV fluids but patient is presently edematous. Closely follow intake output and metabolic panel. 3. Anemia and thrombocytopenia - probably related to cirrhosis. Closely follow CBC. Stool for occult blood has been negative. 4. Diabetes mellitus - since patient is n.p.o. Lantus dose has been decreased by half. Closely follow CBGs.   Chest x-ray and KUB are pending.    Code Status:  Full code.  Family Communication:  Patient's daughter thru the phone.  Disposition Plan:  Admit to inpatient.    Hallelujah Wysong N. [facility] Pager [phone].  If 7PM-7AM, please contact night-coverage [email or web address] Password [email or web address] 01/27/2014, 12:24 AM

## 2014-01-27 NOTE — Progress Notes (Signed)
Patient Demographics  [name], is a 58 y.o. male, DOB - 04/16/1956, UXL:[id number]  Admit date - 01/26/2014   Admitting Physician Rise Patience, MD  Outpatient Primary MD for the patient is Darrell Merino, MD  LOS - 1   Chief Complaint  Patient presents with  . Altered Mental Status      Brief history of present illness: 57 y.o. male with history of alcoholic liver cirrhosis was brought to the ER after patient was found to be increasingly confused since morning. Patient's daughter states patient has been taking his medications as advised. Patient is resubmitted for hepatic encephalopathy and GI bleed and had EGD and colonoscopy. EGD showed varices with no active bleed and colonoscopy showed AVMs. Patient as per patient's daughter did not have any nausea vomiting abdominal pain. Has been having some dark stools. Stool for occult blood in the ER has been negative. Rectal exam were showing yellow stools. On exam patient is still confused. Patient had failed swallow evaluation and patient has been admitted for further management. Received lactulos enema, ammonia level has great improvement, as well patient mentation continues to improve gradually.    Subjective:   [name] confusion much improved, currently communicative more coherent, afebrile, denies any abdominal pain.  Assessment & Plan    Active Problems:   Thrombocytopenia   Hepatic encephalopathy   Acute encephalopathy   Acute kidney injury   Anemia  1- acute encephalopathy:  This is secondary to hepatic encephalopathy and hyperammonemia, will continue with lactulose 3 males every 6 hours as currently he is tolerating by mouth intake, will recheck ammonia level in a.m. patient denies abdominal pain, does not have any tenderness on physical exam, a febrile, has no leukocytosis, does not appear to be  having any symptoms of SBP, will DC his IV antibiotics.  2-anemia and thrombocytopenia:  In the setting of chronic liver disease, they appear to be at baseline, we'll continue to monitor closely, will avoid chemical anticoagulation.  3-acute renal failure: Due to volume depletion, will start on IV fluid, hold his diuresis and lisinopril.  4-diabetes mellitus: Will continue with Lantus, will add insulin sliding scale as currently is tolerating by mouth intake.  Code Status: Full  Family Communication: No one at bedside.  Disposition Plan: Home   Procedures  None   Consults   None   Medications  Scheduled Meds: . cefoTAXime (CLAFORAN) IV  1 g Intravenous 3 times per day  . insulin aspart  0-9 Units Subcutaneous TID WC  . insulin glargine  20 Units Subcutaneous QHS  . lactulose  30 g Oral Q6H  . sodium chloride  3 mL Intravenous Q12H  . thiamine IV  100 mg Intravenous Daily   Continuous Infusions: . sodium chloride 50 mL/hr (01/27/14 1342)   PRN Meds:.acetaminophen, acetaminophen, ondansetron (ZOFRAN) IV, ondansetron  DVT Prophylaxis SCDs   Lab Results  Component Value Date   PLT 53* 01/27/2014    Antibiotics    Anti-infectives   Start     Dose/Rate Route Frequency Ordered Stop   01/27/14 0100  cefoTAXime (CLAFORAN) 1 g in dextrose 5 % 50 mL IVPB     1 g 100 mL/hr over 30 Minutes Intravenous 3 times per day 01/27/14 0024  Objective:   Filed Vitals:   01/26/14 2355 01/27/14 0043 01/27/14 0418 01/27/14 0638  BP:  137/84  125/92  Pulse:  67  57  Temp:  98.1 F (36.7 C)  98 F (36.7 C)  TempSrc:  Oral  Oral  Resp:  17  18  Height: 5\' 11"  (1.803 m)     Weight: 109.77 kg (242 lb) 109.77 kg (242 lb) 109.77 kg (242 lb)   SpO2:  100%  100%    Wt Readings from Last 3 Encounters:  01/27/14 109.77 kg (242 lb)  01/14/14 111.4 kg (245 lb 9.5 oz)  01/14/14 111.4 kg (245 lb 9.5 oz)     Intake/Output Summary (Last 24 hours) at 01/27/14  1609 Last data filed at 01/27/14 0956  Gross per 24 hour  Intake    103 ml  Output   1200 ml  Net  -1097 ml     Physical Exam  General: Well developed and moderately nourished.  Eyes: Anicteric no pallor.  ENT: No discharge from ears eyes nose mouth.  Neck: No mass felt.  Cardiovascular: S1-S2 heard.  Respiratory: No rhonchi or crepitations.  Abdomen: Distended nontender bowel sounds present. No guarding rigidity.  Skin: Multiple chronic skin changes.  Musculoskeletal: Bilateral lower extremity edema.  Psychiatric: Patient is confused.  Neurologic: Patient is alert but confused and does not follow commands.    Data Review   Micro Results No results found for this or any previous visit (from the past 240 hour(s)).  Radiology Reports Ct Head Wo Contrast  01/27/2014   CLINICAL DATA:  Woke up confused and falling since this morning. History of cirrhosis. Dark stools. Altered mental status.  EXAM: CT HEAD WITHOUT CONTRAST  CT CERVICAL SPINE WITHOUT CONTRAST  TECHNIQUE: Multidetector CT imaging of the head and cervical spine was performed following the standard protocol without intravenous contrast. Multiplanar CT image reconstructions of the cervical spine were also generated.  COMPARISON:  01/02/2014  FINDINGS: CT HEAD FINDINGS  Mild diffuse cerebral atrophy. Patchy low-attenuation changes in the deep white matter consistent with small vessel ischemia. No ventricular dilatation. No mass effect or midline shift. No abnormal extra-axial fluid collections. Gray-white matter junctions are distinct. Basal cisterns are not effaced. No evidence of acute intracranial hemorrhage. No depressed skull fractures. Visualized paranasal sinuses and mastoid air cells are not opacified.  CT CERVICAL SPINE FINDINGS  Normal alignment of the cervical spine and facet joints. No vertebral compression deformities. Intervertebral disc space heights are preserved. No prevertebral soft tissue swelling. No focal bone  lesion or bone destruction. C1-2 articulation appears intact. Vascular calcifications in the cervical carotid arteries. Soft tissues are otherwise unremarkable.  IMPRESSION: No acute intracranial abnormalities.  Normal alignment of the cervical spine. No displaced fractures identified.   Electronically Signed   By: Lucienne Capers M.D.   On: 01/27/2014 00:18   Ct Cervical Spine Wo Contrast  01/27/2014   CLINICAL DATA:  Woke up confused and falling since this morning. History of cirrhosis. Dark stools. Altered mental status.  EXAM: CT HEAD WITHOUT CONTRAST  CT CERVICAL SPINE WITHOUT CONTRAST  TECHNIQUE: Multidetector CT imaging of the head and cervical spine was performed following the standard protocol without intravenous contrast. Multiplanar CT image reconstructions of the cervical spine were also generated.  COMPARISON:  01/02/2014  FINDINGS: CT HEAD FINDINGS  Mild diffuse cerebral atrophy. Patchy low-attenuation changes in the deep white matter consistent with small vessel ischemia. No ventricular dilatation. No mass effect or midline shift. No abnormal  extra-axial fluid collections. Gray-white matter junctions are distinct. Basal cisterns are not effaced. No evidence of acute intracranial hemorrhage. No depressed skull fractures. Visualized paranasal sinuses and mastoid air cells are not opacified.  CT CERVICAL SPINE FINDINGS  Normal alignment of the cervical spine and facet joints. No vertebral compression deformities. Intervertebral disc space heights are preserved. No prevertebral soft tissue swelling. No focal bone lesion or bone destruction. C1-2 articulation appears intact. Vascular calcifications in the cervical carotid arteries. Soft tissues are otherwise unremarkable.  IMPRESSION: No acute intracranial abnormalities.  Normal alignment of the cervical spine. No displaced fractures identified.   Electronically Signed   By: Lucienne Capers M.D.   On: 01/27/2014 00:18   Dg Chest Port 1  View  01/27/2014   CLINICAL DATA:  Shortness of breath.  EXAM: PORTABLE CHEST - 1 VIEW  COMPARISON:  01/14/2014  FINDINGS: Shallow inspiration. The heart size and mediastinal contours are within normal limits. Both lungs are clear. The visualized skeletal structures are unremarkable.  IMPRESSION: No active disease.   Electronically Signed   By: Lucienne Capers M.D.   On: 01/27/2014 00:42   Dg Abd Portable 1v  01/27/2014   CLINICAL DATA:  Abdominal distention  EXAM: PORTABLE ABDOMEN - 1 VIEW  COMPARISON:  Lumbar spine 01/17/2013  FINDINGS: Technically limited portable supine study excludes most of the right abdomen from the field of view. There is suggestion of mild gaseous distention of mid abdominal small bowel with fold thickening. Changes likely to represent enteritis.  IMPRESSION: Technically limited study shows mild gaseous distention of mid abdominal small bowel with fold thickening suggesting enteritis.   Electronically Signed   By: Lucienne Capers M.D.   On: 01/27/2014 00:43    CBC  Recent Labs Lab 01/26/14 2115 01/27/14 0447  WBC 4.4 4.0  HGB 8.8* 9.0*  HCT 26.0* 26.8*  PLT 49* 53*  MCV 89.7 90.8  MCH 30.3 30.5  MCHC 33.8 33.6  RDW 16.8* 17.2*  LYMPHSABS  --  1.1  MONOABS  --  0.4  EOSABS  --  0.3  BASOSABS  --  0.0    Chemistries   Recent Labs Lab 01/26/14 2115 01/27/14 0447  NA 141 143  K 5.0 4.2  CL 108 109  CO2 22 23  GLUCOSE 113* 95  BUN 28* 27*  CREATININE 2.13* 1.90*  CALCIUM 8.4 8.6  AST 42* 44*  ALT 23 24  ALKPHOS 75 79  BILITOT 1.9* 2.2*   ------------------------------------------------------------------------------------------------------------------ estimated creatinine clearance is 53.4 ml/min (by C-G formula based on Cr of 1.9). ------------------------------------------------------------------------------------------------------------------ No results found for this basename: HGBA1C,  in the last 72  hours ------------------------------------------------------------------------------------------------------------------ No results found for this basename: CHOL, HDL, LDLCALC, TRIG, CHOLHDL, LDLDIRECT,  in the last 72 hours ------------------------------------------------------------------------------------------------------------------  Recent Labs  01/27/14 0447  TSH 1.750   ------------------------------------------------------------------------------------------------------------------ No results found for this basename: VITAMINB12, FOLATE, FERRITIN, TIBC, IRON, RETICCTPCT,  in the last 72 hours  Coagulation profile  Recent Labs Lab 01/26/14 2115 01/27/14 0447  INR 1.47 1.59*    No results found for this basename: DDIMER,  in the last 72 hours  Cardiac Enzymes No results found for this basename: CK, CKMB, TROPONINI, MYOGLOBIN,  in the last 168 hours ------------------------------------------------------------------------------------------------------------------ No components found with this basename: POCBNP,      Time Spent in minutes   30 minutes   Jenalyn Girdner M.D on 01/27/2014 at 4:09 PM  Between 7am to 7pm - Pager - [phone]  After 7pm go to  [email or web address] - password [email or web address]  And look for the night coverage person covering for me after hours  [facility] Office  [phone]   **Disclaimer: This note may have been dictated with voice recognition software. Similar sounding words can inadvertently be transcribed and this note may contain transcription errors which may not have been corrected upon publication of note.**

## 2014-01-27 NOTE — Progress Notes (Signed)
New Admission Note:   Arrival Method: Via stretcher from ED by Georgeanna Lea, NT Mental Orientation: Alert to self  Telemetry: No orders Assessment: Completed Skin: bilateral lower edema and discoloration from shin down; generalized old scars and scabs IV: 20G Left Forearm 01/26/14 Clean, Dry and Intact SL  Pain: None at this time  Tubes: Applied Condom Catheter Safety Measures: Safety Fall Prevention Plan has been given, discussed and not signed; needs reinforcement  Admission: Completed Unit Orientation: Patient has been orientated to the room, unit and staff; needs reinforcement  Family: None at this time   Given report from [facility] RN Patient fell in the ED around 2145 on 9/6. CTs were ordered on the patient and CXR. Portable CXR at the bedside at this time. Percell Miller also stated that patient failed his swallow eval at the bedside.   Orders have been reviewed and implemented. Will continue to monitor the patient. Call light has been placed within reach and bed alarm has been activated.   Charlsie Merles RN  [address] [facility] # [phone]

## 2014-01-28 LAB — CBC
HCT: 26 % — ABNORMAL LOW (ref 39.0–52.0)
HEMOGLOBIN: 8.5 g/dL — AB (ref 13.0–17.0)
MCH: 30 pg (ref 26.0–34.0)
MCHC: 32.7 g/dL (ref 30.0–36.0)
MCV: 91.9 fL (ref 78.0–100.0)
Platelets: 50 10*3/uL — ABNORMAL LOW (ref 150–400)
RBC: 2.83 MIL/uL — ABNORMAL LOW (ref 4.22–5.81)
RDW: 17.3 % — ABNORMAL HIGH (ref 11.5–15.5)
WBC: 3.6 10*3/uL — AB (ref 4.0–10.5)

## 2014-01-28 LAB — BASIC METABOLIC PANEL
Anion gap: 9 (ref 5–15)
BUN: 18 mg/dL (ref 6–23)
CHLORIDE: 110 meq/L (ref 96–112)
CO2: 24 meq/L (ref 19–32)
Calcium: 8.5 mg/dL (ref 8.4–10.5)
Creatinine, Ser: 1.3 mg/dL (ref 0.50–1.35)
GFR calc Af Amer: 68 mL/min — ABNORMAL LOW (ref >90)
GFR calc non Af Amer: 59 mL/min — ABNORMAL LOW (ref >90)
Glucose, Bld: 95 mg/dL (ref 70–99)
POTASSIUM: 4 meq/L (ref 3.7–5.3)
SODIUM: 143 meq/L (ref 137–147)

## 2014-01-28 LAB — GLUCOSE, CAPILLARY
GLUCOSE-CAPILLARY: 90 mg/dL (ref 70–99)
Glucose-Capillary: 117 mg/dL — ABNORMAL HIGH (ref 70–99)
Glucose-Capillary: 158 mg/dL — ABNORMAL HIGH (ref 70–99)

## 2014-01-28 LAB — AMMONIA: Ammonia: 57 umol/L (ref 11–60)

## 2014-01-28 MED ORDER — OXYCODONE HCL 5 MG PO TABS
5.0000 mg | ORAL_TABLET | Freq: Four times a day (QID) | ORAL | Status: DC | PRN
  Administered 2014-01-28 – 2014-01-29 (×3): 5 mg via ORAL
  Filled 2014-01-28 (×3): qty 1

## 2014-01-28 MED ORDER — INSULIN ASPART 100 UNIT/ML ~~LOC~~ SOLN: 0.0000 [IU] | Freq: Three times a day (TID) | SUBCUTANEOUS | Status: DC

## 2014-01-28 NOTE — Progress Notes (Signed)
Patient Demographics  [name], is a 58 y.o. male, DOB - 21-Apr-1956, QIO:[id number]  Admit date - 01/26/2014   Admitting Physician Rise Patience, MD  Outpatient Primary MD for the patient is Barbette Merino, MD  LOS - 2   Chief Complaint  Patient presents with  . Altered Mental Status      Brief history of present illness: 58 y.o. male with history of alcoholic liver cirrhosis was brought to the ER after patient was found to be increasingly confused since morning. Patient's daughter states patient has been taking his medications as advised. Patient is resubmitted for hepatic encephalopathy and GI bleed and had EGD and colonoscopy. EGD showed varices with no active bleed and colonoscopy showed AVMs. Patient as per patient's daughter did not have any nausea vomiting abdominal pain. Has been having some dark stools. Stool for occult blood in the ER has been negative. Rectal exam were showing yellow stools. On exam patient is still confused. Patient had failed swallow evaluation and patient has been admitted for further management. Received lactulos enema, ammonia level has great improvement, as well patient mentation continues to improve gradually. Review of back on his diet, which he tolerated very well, renal function continued to improve on IV fluid, his antibiotics were stopped as patient did not have any abdominal pain, no fever, no leukocytosis, no evidence of SBP.    Subjective:   [name] confusion much improved, currently communicative more coherent, afebrile, denies any abdominal pain.  Assessment & Plan    Active Problems:   Thrombocytopenia   Hepatic encephalopathy   Acute encephalopathy   Acute kidney injury   Anemia  1- acute encephalopathy:  This is secondary to hepatic encephalopathy and hyperammonemia, will continue with lactulose 30 ml every  6 hours as currently he is tolerating by mouth intake, will recheck ammonia level in a.m. patient denies abdominal pain, does not have any tenderness on physical exam, a febrile, has no leukocytosis, does not appear to be having any symptoms of SBP, his IV antibiotics were stopped. Will be resumed back rifaximin, and propranolol.  2-anemia and thrombocytopenia:  In the setting of chronic liver disease, they appear to be at baseline, we'll continue to monitor closely, will avoid chemical anticoagulation.  3-acute renal failure: Due to volume depletion, will continue on IV fluid, hold his diuresis and lisinopril.  4-diabetes mellitus: Will continue with Lantus, and insulin sliding scale as currently is tolerating by mouth intake.  5-alcoholic liver disease:   Continuous lactulose, propranolol, and rifaximin, will resume back on Aldactone and Lasix upon discharge.  Code Status: Full  Family Communication: Discussed with daughter over the phone.  Disposition Plan: Home   Procedures  None   Consults   None   Medications  Scheduled Meds: . insulin aspart  0-9 Units Subcutaneous TID WC  . insulin glargine  20 Units Subcutaneous QHS  . lactulose  30 g Oral Q6H  . sodium chloride  3 mL Intravenous Q12H  . thiamine IV  100 mg Intravenous Daily   Continuous Infusions: . sodium chloride 75 mL/hr at 01/28/14 1046   PRN Meds:.acetaminophen, acetaminophen, ondansetron (ZOFRAN) IV, ondansetron, oxyCODONE  DVT Prophylaxis SCDs   Lab Results  Component Value Date   PLT 50*  01/28/2014    Antibiotics    Anti-infectives   Start     Dose/Rate Route Frequency Ordered Stop   01/27/14 0100  cefoTAXime (CLAFORAN) 1 g in dextrose 5 % 50 mL IVPB  Status:  Discontinued     1 g 100 mL/hr over 30 Minutes Intravenous 3 times per day 01/27/14 0024 01/27/14 1618          Objective:   Filed Vitals:   01/27/14 1821 01/27/14 2025 01/28/14 0539 01/28/14 0919  BP: 141/69 134/52 143/103  132/82  Pulse: 72 70 69 72  Temp: 98.5 F (36.9 C) 98.4 F (36.9 C) 98 F (36.7 C) 97.6 F (36.4 C)  TempSrc: Oral Oral Oral Oral  Resp: 18 18 20 18   Height:      Weight:  106.9 kg (235 lb 10.8 oz)    SpO2:  98% 98% 99%    Wt Readings from Last 3 Encounters:  01/27/14 106.9 kg (235 lb 10.8 oz)  01/14/14 111.4 kg (245 lb 9.5 oz)  01/14/14 111.4 kg (245 lb 9.5 oz)     Intake/Output Summary (Last 24 hours) at 01/28/14 1853 Last data filed at 01/28/14 0500  Gross per 24 hour  Intake    765 ml  Output    600 ml  Net    165 ml     Physical Exam  General: Well developed and moderately nourished.  Eyes: Anicteric no pallor.  ENT: No discharge from ears eyes nose mouth.  Neck: No mass felt.  Cardiovascular: S1-S2 heard.  Respiratory: No rhonchi or crepitations.  Abdomen: Mildly Distended, nontender bowel sounds present. No guarding rigidity.  Skin: Multiple chronic skin changes.  Musculoskeletal: Bilateral lower extremity edema.  Psychiatric: Patient is much more coherent today, awake alert x2-3. Neurologic: Patient is alert , has no significant focal deficit   Data Review   Micro Results No results found for this or any previous visit (from the past 240 hour(s)).  Radiology Reports Ct Head Wo Contrast  01/27/2014   CLINICAL DATA:  Woke up confused and falling since this morning. History of cirrhosis. Dark stools. Altered mental status.  EXAM: CT HEAD WITHOUT CONTRAST  CT CERVICAL SPINE WITHOUT CONTRAST  TECHNIQUE: Multidetector CT imaging of the head and cervical spine was performed following the standard protocol without intravenous contrast. Multiplanar CT image reconstructions of the cervical spine were also generated.  COMPARISON:  01/02/2014  FINDINGS: CT HEAD FINDINGS  Mild diffuse cerebral atrophy. Patchy low-attenuation changes in the deep white matter consistent with small vessel ischemia. No ventricular dilatation. No mass effect or midline shift. No abnormal  extra-axial fluid collections. Gray-white matter junctions are distinct. Basal cisterns are not effaced. No evidence of acute intracranial hemorrhage. No depressed skull fractures. Visualized paranasal sinuses and mastoid air cells are not opacified.  CT CERVICAL SPINE FINDINGS  Normal alignment of the cervical spine and facet joints. No vertebral compression deformities. Intervertebral disc space heights are preserved. No prevertebral soft tissue swelling. No focal bone lesion or bone destruction. C1-2 articulation appears intact. Vascular calcifications in the cervical carotid arteries. Soft tissues are otherwise unremarkable.  IMPRESSION: No acute intracranial abnormalities.  Normal alignment of the cervical spine. No displaced fractures identified.   Electronically Signed   By: Lucienne Capers M.D.   On: 01/27/2014 00:18   Ct Cervical Spine Wo Contrast  01/27/2014   CLINICAL DATA:  Woke up confused and falling since this morning. History of cirrhosis. Dark stools. Altered mental status.  EXAM: CT  HEAD WITHOUT CONTRAST  CT CERVICAL SPINE WITHOUT CONTRAST  TECHNIQUE: Multidetector CT imaging of the head and cervical spine was performed following the standard protocol without intravenous contrast. Multiplanar CT image reconstructions of the cervical spine were also generated.  COMPARISON:  01/02/2014  FINDINGS: CT HEAD FINDINGS  Mild diffuse cerebral atrophy. Patchy low-attenuation changes in the deep white matter consistent with small vessel ischemia. No ventricular dilatation. No mass effect or midline shift. No abnormal extra-axial fluid collections. Gray-white matter junctions are distinct. Basal cisterns are not effaced. No evidence of acute intracranial hemorrhage. No depressed skull fractures. Visualized paranasal sinuses and mastoid air cells are not opacified.  CT CERVICAL SPINE FINDINGS  Normal alignment of the cervical spine and facet joints. No vertebral compression deformities. Intervertebral disc  space heights are preserved. No prevertebral soft tissue swelling. No focal bone lesion or bone destruction. C1-2 articulation appears intact. Vascular calcifications in the cervical carotid arteries. Soft tissues are otherwise unremarkable.  IMPRESSION: No acute intracranial abnormalities.  Normal alignment of the cervical spine. No displaced fractures identified.   Electronically Signed   By: Lucienne Capers M.D.   On: 01/27/2014 00:18   Dg Chest Port 1 View  01/27/2014   CLINICAL DATA:  Shortness of breath.  EXAM: PORTABLE CHEST - 1 VIEW  COMPARISON:  01/14/2014  FINDINGS: Shallow inspiration. The heart size and mediastinal contours are within normal limits. Both lungs are clear. The visualized skeletal structures are unremarkable.  IMPRESSION: No active disease.   Electronically Signed   By: Lucienne Capers M.D.   On: 01/27/2014 00:42   Dg Abd Portable 1v  01/27/2014   CLINICAL DATA:  Abdominal distention  EXAM: PORTABLE ABDOMEN - 1 VIEW  COMPARISON:  Lumbar spine 01/17/2013  FINDINGS: Technically limited portable supine study excludes most of the right abdomen from the field of view. There is suggestion of mild gaseous distention of mid abdominal small bowel with fold thickening. Changes likely to represent enteritis.  IMPRESSION: Technically limited study shows mild gaseous distention of mid abdominal small bowel with fold thickening suggesting enteritis.   Electronically Signed   By: Lucienne Capers M.D.   On: 01/27/2014 00:43    CBC  Recent Labs Lab 01/26/14 2115 01/27/14 0447 01/28/14 0403  WBC 4.4 4.0 3.6*  HGB 8.8* 9.0* 8.5*  HCT 26.0* 26.8* 26.0*  PLT 49* 53* 50*  MCV 89.7 90.8 91.9  MCH 30.3 30.5 30.0  MCHC 33.8 33.6 32.7  RDW 16.8* 17.2* 17.3*  LYMPHSABS  --  1.1  --   MONOABS  --  0.4  --   EOSABS  --  0.3  --   BASOSABS  --  0.0  --     Chemistries   Recent Labs Lab 01/26/14 2115 01/27/14 0447 01/28/14 0403  NA 141 143 143  K 5.0 4.2 4.0  CL 108 109 110  CO2  22 23 24   GLUCOSE 113* 95 95  BUN 28* 27* 18  CREATININE 2.13* 1.90* 1.30  CALCIUM 8.4 8.6 8.5  AST 42* 44*  --   ALT 23 24  --   ALKPHOS 75 79  --   BILITOT 1.9* 2.2*  --    ------------------------------------------------------------------------------------------------------------------ estimated creatinine clearance is 77 ml/min (by C-G formula based on Cr of 1.3). ------------------------------------------------------------------------------------------------------------------ No results found for this basename: HGBA1C,  in the last 72 hours ------------------------------------------------------------------------------------------------------------------ No results found for this basename: CHOL, HDL, LDLCALC, TRIG, CHOLHDL, LDLDIRECT,  in the last 72 hours ------------------------------------------------------------------------------------------------------------------  Recent Labs  01/27/14 0447  TSH 1.750   ------------------------------------------------------------------------------------------------------------------ No results found for this basename: VITAMINB12, FOLATE, FERRITIN, TIBC, IRON, RETICCTPCT,  in the last 72 hours  Coagulation profile  Recent Labs Lab 01/26/14 2115 01/27/14 0447  INR 1.47 1.59*    No results found for this basename: DDIMER,  in the last 72 hours  Cardiac Enzymes No results found for this basename: CK, CKMB, TROPONINI, MYOGLOBIN,  in the last 168 hours ------------------------------------------------------------------------------------------------------------------ No components found with this basename: POCBNP,      Time Spent in minutes   30 minutes   Claudis Giovanelli M.D on 01/28/2014 at 6:53 PM  Between 7am to 7pm - Pager - [phone]  After 7pm go to [email or web address] - password [email or web address]  And look for the night coverage person covering for me after hours  [facility] Office  [phone]   **Disclaimer: This  note may have been dictated with voice recognition software. Similar sounding words can inadvertently be transcribed and this note may contain transcription errors which may not have been corrected upon publication of note.**

## 2014-01-29 DIAGNOSIS — K7682 Hepatic encephalopathy: Principal | ICD-10-CM

## 2014-01-29 DIAGNOSIS — K7290 Hepatic failure, unspecified without coma: Principal | ICD-10-CM

## 2014-01-29 LAB — CBC
HCT: 25.4 % — ABNORMAL LOW (ref 39.0–52.0)
HEMOGLOBIN: 8.4 g/dL — AB (ref 13.0–17.0)
MCH: 30.5 pg (ref 26.0–34.0)
MCHC: 33.1 g/dL (ref 30.0–36.0)
MCV: 92.4 fL (ref 78.0–100.0)
PLATELETS: 48 10*3/uL — AB (ref 150–400)
RBC: 2.75 MIL/uL — ABNORMAL LOW (ref 4.22–5.81)
RDW: 16.9 % — ABNORMAL HIGH (ref 11.5–15.5)
WBC: 3.1 10*3/uL — ABNORMAL LOW (ref 4.0–10.5)

## 2014-01-29 LAB — BASIC METABOLIC PANEL
Anion gap: 10 (ref 5–15)
BUN: 9 mg/dL (ref 6–23)
CALCIUM: 7.9 mg/dL — AB (ref 8.4–10.5)
CO2: 20 mEq/L (ref 19–32)
CREATININE: 0.91 mg/dL (ref 0.50–1.35)
Chloride: 106 mEq/L (ref 96–112)
GFR calc Af Amer: >90 mL/min (ref >90)
GLUCOSE: 263 mg/dL — AB (ref 70–99)
Potassium: 4.1 mEq/L (ref 3.7–5.3)
SODIUM: 136 meq/L — AB (ref 137–147)

## 2014-01-29 LAB — GLUCOSE, CAPILLARY
Glucose-Capillary: 209 mg/dL — ABNORMAL HIGH (ref 70–99)
Glucose-Capillary: 113 mg/dL — ABNORMAL HIGH (ref 70–99)

## 2014-01-29 LAB — AMMONIA: AMMONIA: 70 umol/L — AB (ref 11–60)

## 2014-01-29 MED ORDER — RIFAXIMIN 550 MG PO TABS: 550.0000 mg | ORAL_TABLET | Freq: Two times a day (BID) | ORAL | Status: DC

## 2014-01-29 MED ORDER — ALPRAZOLAM 0.5 MG PO TABS: 1.0000 mg | ORAL_TABLET | Freq: Every evening | ORAL | Status: DC | PRN

## 2014-01-29 MED ORDER — LACTULOSE 10 GM/15ML PO SOLN: 30.0000 g | Freq: Three times a day (TID) | ORAL | Status: DC

## 2014-01-29 MED ORDER — ALPRAZOLAM 0.5 MG PO TABS
1.0000 mg | ORAL_TABLET | Freq: Every evening | ORAL | Status: DC | PRN
  Administered 2014-01-29: 1 mg via ORAL
  Filled 2014-01-29: qty 2

## 2014-01-29 MED ORDER — ALPRAZOLAM 0.25 MG PO TABS: 0.2500 mg | ORAL_TABLET | Freq: Three times a day (TID) | ORAL | Status: DC | PRN

## 2014-01-29 MED ORDER — RIFAXIMIN 550 MG PO TABS
550.0000 mg | ORAL_TABLET | Freq: Two times a day (BID) | ORAL | Status: DC
  Administered 2014-01-29: 550 mg via ORAL
  Filled 2014-01-29 (×3): qty 1

## 2014-01-29 MED ORDER — AMITRIPTYLINE HCL 25 MG PO TABS
25.0000 mg | ORAL_TABLET | Freq: Every day | ORAL | Status: DC
  Administered 2014-01-29: 25 mg via ORAL
  Filled 2014-01-29 (×2): qty 1

## 2014-01-29 MED ORDER — SPIRONOLACTONE 50 MG PO TABS: 50.0000 mg | ORAL_TABLET | Freq: Every day | ORAL | Status: DC

## 2014-01-29 MED ORDER — FUROSEMIDE 20 MG PO TABS: 20.0000 mg | ORAL_TABLET | Freq: Every day | ORAL | Status: DC

## 2014-01-29 MED ORDER — PANTOPRAZOLE SODIUM 40 MG PO TBEC
40.0000 mg | DELAYED_RELEASE_TABLET | Freq: Every day | ORAL | Status: DC
  Administered 2014-01-29: 40 mg via ORAL
  Filled 2014-01-29: qty 1

## 2014-01-29 MED ORDER — PROPRANOLOL HCL 20 MG PO TABS
20.0000 mg | ORAL_TABLET | Freq: Every day | ORAL | Status: DC
  Administered 2014-01-29: 20 mg via ORAL
  Filled 2014-01-29: qty 1

## 2014-01-29 NOTE — Discharge Summary (Signed)
[name], is a 58 y.o. male  DOB 16-Apr-1956  MRN [id number].  Admission date:  01/26/2014  Admitting Physician  Rise Patience, MD  Discharge Date:  01/29/2014   Primary MD  Barbette Merino, MD  Recommendations for primary care physician for things to follow:   All her BMP and ammonia closely. Adjust diuretic dose as needed.   Admission Diagnosis  Hepatic encephalopathy [572.2] AKI (acute kidney injury) [584.9] Anemia, unspecified anemia type [285.9]   Discharge Diagnosis  Hepatic encephalopathy [572.2] AKI (acute kidney injury) [584.9] Anemia, unspecified anemia type [285.9]     Active Problems:   Thrombocytopenia   Hepatic encephalopathy   Acute encephalopathy   Acute kidney injury   Anemia      Past Medical History  Diagnosis Date  . Neuropathy   . Diabetes mellitus   . Bipolar affect, depressed   . Hypertension   . Arthritis   . Stroke     Mini stroke about 18yrs ago  . Cirrhosis   . Alcohol abuse   . Chronic pain   . Cocaine abuse   . Muscle spasm     both legs  . Encephalopathy, hepatic   . Detached retina   . COPD (chronic obstructive pulmonary disease)     emphysema  . Bronchitis   . Barrett's esophagus   . GERD (gastroesophageal reflux disease)     has ulcer  . Anemia     Past Surgical History  Procedure Laterality Date  . Fracture surgery      Leg and arm 12yrs ago  . Esophagogastroduodenoscopy  04/04/2012    Procedure: ESOPHAGOGASTRODUODENOSCOPY (EGD);  Surgeon: Irene Shipper, MD;  Location: [facility] ENDOSCOPY;  Service: Endoscopy;  Laterality: N/A;  . Esophagogastroduodenoscopy Left 03/13/2013    Procedure: ESOPHAGOGASTRODUODENOSCOPY (EGD);  Surgeon: Arta Silence, MD;  Location: [facility] ENDOSCOPY;  Service: Endoscopy;  Laterality: Left;  Marland Kitchen Eye surgery  8 months ago both eyes    cataracts both  eyes, detached eye, gas pocket  . Vasectomy    . Pars plana vitrectomy Left 07/08/2013    Procedure: PARS PLANA VITRECTOMY WITH 25 GAUGE;  Surgeon: Hurman Horn, MD;  Location: [place];  Service: Ophthalmology;  Laterality: Left;  . Intraocular lens removal Left 07/08/2013    Procedure: REMOVAL OF INTRAOCULAR LENS;  Surgeon: Hurman Horn, MD;  Location: [place];  Service: Ophthalmology;  Laterality: Left;  . Placement and suture of secondary intraocular lens Left 07/08/2013    Procedure: PLACEMENT AND SUTURE OF SECONDARY INTRAOCULAR LENS;  Surgeon: Hurman Horn, MD;  Location: [place];  Service: Ophthalmology;  Laterality: Left;  Insertion of Anterior Capsule Intraocular Lens   . Esophagogastroduodenoscopy N/A 01/16/2014    Procedure: ESOPHAGOGASTRODUODENOSCOPY (EGD);  Surgeon: Winfield Cunas., MD;  Location: [facility] ENDOSCOPY;  Service: Endoscopy;  Laterality: N/A;  . Colonoscopy N/A 01/17/2014    Procedure: COLONOSCOPY;  Surgeon: Winfield Cunas., MD;  Location: [facility] ENDOSCOPY;  Service: Endoscopy;  Laterality: N/A;  possible banding  History of present illness and  Hospital Course:     Kindly see H&P for history of present illness and admission details, please review complete Labs, Consult reports and Test reports for all details in brief  HPI  from the history and physical done on the day of admission   [name] is a 58 y.o. male with history of alcoholic liver cirrhosis was brought to the ER after patient was found to be increasingly confused since morning. Patient's daughter states patient has been taking his medications as advised. Patient is resubmitted for hepatic encephalopathy and GI bleed and had EGD and colonoscopy. EGD showed varices with no active bleed and colonoscopy showed AVMs. Patient as per patient's daughter did not have any nausea vomiting abdominal pain. Has been having some dark stools. Stool for occult blood in the ER has been negative. Rectal exam were showing  yellow stools. On exam patient is still confused. Patient had failed swallow evaluation and patient has been admitted for further management. CT head and neck were negative for any acute. Patient has been limited for further management.    Hospital Course   1. Alcoholic cirrhosis with acute hepatic encephalopathy due to elevated ammonia. He has had issues with compliance with lactulose and Xifaxan, refuses placement, question home support. With lactulose and Xifaxan ammonia level stable and mental status back to baseline. He refused placement again. Will have home PT, RN, healthy dad social worker follow at home. Says girlfriend has recently moved and along with his daughter who has moved from [place] and that he will get better care. We'll follow with GI and PCP closely. Says he quit drinking close to 2 years now. Requested to continue abstaining. Cutdown his benzodiazepine does also.   2. Anemia of chronic disease with chronic thrombocytopenia in the setting of cirrhosis. Stable.   3. ARF due to dehydration. Resolved with IV fluids, discontinue ACE, cut back his diuretic dose into half. Request PCP to monitor BMP in diuretic dose closely.   4. DM type II. Resume home regimen. A1c recently was 6.4.   5. Alcoholic cirrhosis. Lactulose, Xifaxan, diuretic at half dose, beta blocker resume. Outpatient GI followup with Dr. Paulita Fujita or Dr. Oletta Lamas.    6. Chronic left arm pain and weakness. Outpatient followup.     Discharge Condition: Stable   Follow UP  Follow-up Information   Follow up with GARBA,LAWAL, MD. Schedule an appointment as soon as possible for a visit in 3 days.   Specialty:  Internal Medicine   Contact information:   [address] [place] [address] [phone]       Follow up with EDWARDS JR,JAMES L, MD. Schedule an appointment as soon as possible for a visit in 1 week. (Or Dr. Paulita Fujita)    Specialty:  Gastroenterology   Contact information:   [address] [place]  [address] [phone]         Discharge Instructions  and  Discharge Medications          Discharge Instructions   Discharge instructions    Complete by:  As directed   Follow with Primary MD Barbette Merino, MD in 3 days   Get CBC, CMP, Ammonia,  2 view Chest X ray checked  by Primary MD next visit.    Activity: As tolerated with Full fall precautions use  walker/cane & assistance as needed   Disposition Home **   Diet: Heart Healthy Low carb with feeding assistance and aspiration precautions.  Accuchecks 4 times/day, Once in AM empty stomach and then before each meal. Log in all results and show them to your Prim.MD in 3 days. If any glucose reading is under 80 or above 300 call your Prim MD immidiately. Follow Low glucose instructions for glucose under 80 as instructed.    For Heart failure patients - Check your Weight same time everyday, if you gain over 2 pounds, or you develop in leg swelling, experience more shortness of breath or chest pain, call your Primary MD immediately. Follow Cardiac Low Salt Diet and 1.8 lit/day fluid restriction.   On your next visit with her primary care physician please Get Medicines reviewed and adjusted.  Please request your Prim.MD to go over all Hospital Tests and Procedure/Radiological results at the follow up, please get all Hospital records sent to your Prim MD by signing hospital release before you go home.   If you experience worsening of your admission symptoms, develop shortness of breath, life threatening emergency, suicidal or homicidal thoughts you must seek medical attention immediately by calling 911 or calling your MD immediately  if symptoms less severe.  You Must read complete instructions/literature along with all the possible adverse reactions/side effects for all the Medicines you take and that have been prescribed to you. Take any new Medicines after you have  completely understood and accpet all the possible adverse reactions/side effects.   Do not drive, operating heavy machinery, perform activities at heights, swimming or participation in water activities or provide baby sitting services if your were admitted for syncope or siezures until you have seen by Primary MD or a Neurologist and advised to do so again.  Do not drive when taking Pain medications.    Do not take more than prescribed Pain, Sleep and Anxiety Medications  Special Instructions: If you have smoked or chewed Tobacco  in the last 2 yrs please stop smoking, stop any regular Alcohol  and or any Recreational drug use.  Wear Seat belts while driving.   Please note  You were cared for by a hospitalist during your hospital stay. If you have any questions about your discharge medications or the care you received while you were in the hospital after you are discharged, you can call the unit and asked to speak with the hospitalist on call if the hospitalist that took care of you is not available. Once you are discharged, your primary care physician will handle any further medical issues. Please note that NO REFILLS for any discharge medications will be authorized once you are discharged, as it is imperative that you return to your primary care physician (or establish a relationship with a primary care physician if you do not have one) for your aftercare needs so that they can reassess your need for medications and monitor your lab values.     Increase activity slowly    Complete by:  As directed             Medication List    STOP taking these medications       lisinopril 20 MG tablet  Commonly known as:  PRINIVIL,ZESTRIL     naproxen 500 MG tablet  Commonly known as:  NAPROSYN      TAKE these medications       ALPRAZolam 0.5 MG tablet  Commonly known as:  XANAX  Take 2  tablets (1 mg total) by mouth at bedtime as needed for anxiety.     amitriptyline 25 MG tablet   Commonly known as:  ELAVIL  Take 25 mg by mouth at bedtime.     ciprofloxacin 500 MG tablet  Commonly known as:  CIPRO  Take 1 tablet (500 mg total) by mouth 2 (two) times daily.     diclofenac sodium 1 % Gel  Commonly known as:  VOLTAREN  Apply 1 application topically 2 (two) times daily as needed (pain).     furosemide 20 MG tablet  Commonly known as:  LASIX  Take 1 tablet (20 mg total) by mouth daily.     Insulin Glargine 100 UNIT/ML Solostar Pen  Commonly known as:  LANTUS SOLOSTAR  Inject 45 Units into the skin at bedtime.     lactulose 10 GM/15ML solution  Commonly known as:  CHRONULAC  Take 45 mLs (30 g total) by mouth 3 (three) times daily. Titrate to at least 3 BMs daily     NOVOLOG FLEXPEN 100 UNIT/ML FlexPen  Generic drug:  insulin aspart  Inject 6 Units into the skin 3 (three) times daily with meals.     Oxycodone HCl 10 MG Tabs  Take 10 mg by mouth 3 (three) times daily. scheduled     pantoprazole 40 MG tablet  Commonly known as:  PROTONIX  Take 40 mg by mouth at bedtime.     propranolol 20 MG tablet  Commonly known as:  INDERAL  Take 20 mg by mouth daily.     rifaximin 550 MG Tabs tablet  Commonly known as:  XIFAXAN  Take 1 tablet (550 mg total) by mouth 2 (two) times daily.     spironolactone 50 MG tablet  Commonly known as:  ALDACTONE  Take 1 tablet (50 mg total) by mouth daily.          Diet and Activity recommendation: See Discharge Instructions above   Consults obtained - stable   Major procedures and Radiology Reports - PLEASE review detailed and final reports for all details, in brief -       Ct Head Wo Contrast  01/27/2014   CLINICAL DATA:  Woke up confused and falling since this morning. History of cirrhosis. Dark stools. Altered mental status.  EXAM: CT HEAD WITHOUT CONTRAST  CT CERVICAL SPINE WITHOUT CONTRAST  TECHNIQUE: Multidetector CT imaging of the head and cervical spine was performed following the standard protocol without  intravenous contrast. Multiplanar CT image reconstructions of the cervical spine were also generated.  COMPARISON:  01/02/2014  FINDINGS: CT HEAD FINDINGS  Mild diffuse cerebral atrophy. Patchy low-attenuation changes in the deep white matter consistent with small vessel ischemia. No ventricular dilatation. No mass effect or midline shift. No abnormal extra-axial fluid collections. Gray-white matter junctions are distinct. Basal cisterns are not effaced. No evidence of acute intracranial hemorrhage. No depressed skull fractures. Visualized paranasal sinuses and mastoid air cells are not opacified.  CT CERVICAL SPINE FINDINGS  Normal alignment of the cervical spine and facet joints. No vertebral compression deformities. Intervertebral disc space heights are preserved. No prevertebral soft tissue swelling. No focal bone lesion or bone destruction. C1-2 articulation appears intact. Vascular calcifications in the cervical carotid arteries. Soft tissues are otherwise unremarkable.  IMPRESSION: No acute intracranial abnormalities.  Normal alignment of the cervical spine. No displaced fractures identified.   Electronically Signed   By: Lucienne Capers M.D.   On: 01/27/2014 00:18      Ct Cervical  Spine Wo Contrast  01/27/2014   CLINICAL DATA:  Woke up confused and falling since this morning. History of cirrhosis. Dark stools. Altered mental status.  EXAM: CT HEAD WITHOUT CONTRAST  CT CERVICAL SPINE WITHOUT CONTRAST  TECHNIQUE: Multidetector CT imaging of the head and cervical spine was performed following the standard protocol without intravenous contrast. Multiplanar CT image reconstructions of the cervical spine were also generated.  COMPARISON:  01/02/2014  FINDINGS: CT HEAD FINDINGS  Mild diffuse cerebral atrophy. Patchy low-attenuation changes in the deep white matter consistent with small vessel ischemia. No ventricular dilatation. No mass effect or midline shift. No abnormal extra-axial fluid collections.  Gray-white matter junctions are distinct. Basal cisterns are not effaced. No evidence of acute intracranial hemorrhage. No depressed skull fractures. Visualized paranasal sinuses and mastoid air cells are not opacified.  CT CERVICAL SPINE FINDINGS  Normal alignment of the cervical spine and facet joints. No vertebral compression deformities. Intervertebral disc space heights are preserved. No prevertebral soft tissue swelling. No focal bone lesion or bone destruction. C1-2 articulation appears intact. Vascular calcifications in the cervical carotid arteries. Soft tissues are otherwise unremarkable.  IMPRESSION: No acute intracranial abnormalities.  Normal alignment of the cervical spine. No displaced fractures identified.   Electronically Signed   By: Lucienne Capers M.D.   On: 01/27/2014 00:18     Dg Chest Port 1 View  01/27/2014   CLINICAL DATA:  Shortness of breath.  EXAM: PORTABLE CHEST - 1 VIEW  COMPARISON:  01/14/2014  FINDINGS: Shallow inspiration. The heart size and mediastinal contours are within normal limits. Both lungs are clear. The visualized skeletal structures are unremarkable.  IMPRESSION: No active disease.   Electronically Signed   By: Lucienne Capers M.D.   On: 01/27/2014 00:42      Dg Abd Portable 1v  01/27/2014   CLINICAL DATA:  Abdominal distention  EXAM: PORTABLE ABDOMEN - 1 VIEW  COMPARISON:  Lumbar spine 01/17/2013  FINDINGS: Technically limited portable supine study excludes most of the right abdomen from the field of view. There is suggestion of mild gaseous distention of mid abdominal small bowel with fold thickening. Changes likely to represent enteritis.  IMPRESSION: Technically limited study shows mild gaseous distention of mid abdominal small bowel with fold thickening suggesting enteritis.   Electronically Signed   By: Lucienne Capers M.D.   On: 01/27/2014 00:43    Micro Results      No results found for this or any previous visit (from the past 240  hour(s)).     Today   Subjective:   [name] today has no headache,no chest abdominal pain,no new weakness tingling or numbness, feels much better wants to go home today.   Objective:   Blood pressure 127/57, pulse 79, temperature 98 F (36.7 C), temperature source Oral, resp. rate 18, height 5\' 11"  (1.803 m), weight 106.4 kg (234 lb 9.1 oz), SpO2 98.00%.   Intake/Output Summary (Last 24 hours) at 01/29/14 1106 Last data filed at 01/29/14 0903  Gross per 24 hour  Intake    120 ml  Output   3550 ml  Net  -3430 ml    Exam Awake Alert, Oriented x 3, No new F.N deficits, Normal affect [place].AT,PERRAL Supple Neck,No JVD, No cervical lymphadenopathy appriciated.  Symmetrical Chest wall movement, Good air movement bilaterally, CTAB RRR,No Gallops,Rubs or new Murmurs, No Parasternal Heave +ve B.Sounds, Abd Soft, Non tender, No organomegaly appriciated, No rebound -guarding or rigidity. No Cyanosis, Clubbing or edema, No new Rash  or bruise  Data Review   CBC w Diff: Lab Results  Component Value Date   WBC 3.1* 01/29/2014   WBC 3.7* 04/16/2012   HGB 8.4* 01/29/2014   HGB 10.2* 04/16/2012   HCT 25.4* 01/29/2014   HCT 33.4* 04/16/2012   PLT 48* 01/29/2014   LYMPHOPCT 27 01/27/2014   MONOPCT 11 01/27/2014   EOSPCT 7* 01/27/2014   BASOPCT 1 01/27/2014    CMP: Lab Results  Component Value Date   NA 136* 01/29/2014   K 4.1 01/29/2014   CL 106 01/29/2014   CO2 20 01/29/2014   BUN 9 01/29/2014   CREATININE 0.91 01/29/2014   CREATININE 0.62 04/16/2012   PROT 5.4* 01/27/2014   ALBUMIN 2.5* 01/27/2014   BILITOT 2.2* 01/27/2014   ALKPHOS 79 01/27/2014   AST 44* 01/27/2014   ALT 24 01/27/2014  .   Total Time in preparing paper work, data evaluation and todays exam - 35 minutes  Thurnell Lose M.D on 01/29/2014 at 11:06 AM  [facility] Office  [phone]   **Disclaimer: This note may have been dictated with voice recognition software. Similar sounding words can inadvertently be  transcribed and this note may contain transcription errors which may not have been corrected upon publication of note.**

## 2014-01-29 NOTE — Care Management Note (Addendum)
CARE MANAGEMENT NOTE 01/29/2014  Patient:  [name], [name]   Account Number:  [id number]  Date Initiated:  01/29/2014  Documentation initiated by:  Jasmine Pang  Subjective/Objective Assessment:   CM following for progression and d/c planning     Action/Plan:   01/29/2014 Met with pt who plans to d/c back to home. Daughter and grandchildren live with him and daughter cares for him. Active with [facility] and wishes to continue [facility], [place] and [place] aide. Has DME per pt.   Anticipated DC Date:     Anticipated DC Plan:  [place]         Choice offered to / List presented to:          [facility] arranged  HH-1 RN  [place] Worker  [place].   Status of service:  Completed, signed off Medicare Important Message given?  YES (If response is "NO", the following Medicare IM given date fields will be blank) Date Medicare IM given:  01/29/2014 Medicare IM given by:  Tammala Weider Date Additional Medicare IM given:   Additional Medicare IM given by:    Discharge Disposition:  [place]  Per UR Regulation:    If discussed at Long Length of Stay Meetings, dates discussed:    Comments:  01/29/2014 Met with pt and discussed HH needs and DME. Pt states that he has a walker, cane and BSC , no other needs identified. Call placed to pt daughter, whom he states provides his care to inquire of any other needs they may have. Jasmine Pang RN MPH, case manager, [phone]

## 2014-01-29 NOTE — Evaluation (Signed)
Physical Therapy Evaluation Patient Details Name: [name] MRN: [id number] DOB: 15-Aug-1955 Today's Date: 01/29/2014   History of Present Illness  58 y.o. male with history of alcoholic liver cirrhosis was brought to the ER after patient was found to be increasingly confused since morning. Patient's daughter states patient has been taking his medications as advised. Patient is resubmitted for hepatic encephalopathy and GI bleed and had EGD and colonoscopy. EGD showed varices with no active bleed and colonoscopy showed AVMs. Patient as per patient's daughter did not have any nausea vomiting abdominal pain. Has been having some dark stools. Stool for occult blood in the ER has been negative. Rectal exam were showing yellow stools. On exam patient is still confused. Patient had failed swallow evaluation and patient has been admitted for further management. Received lactulos enema, ammonia level has great improvement, as well patient mentation continues to improve gradually. Review of back on his diet, which he tolerated very well, renal function continued to improve on IV fluid, his antibiotics were stopped as patient did not have any abdominal pain, no fever, no leukocytosis, no evidence of SBP.; PT ordered 9/8  Clinical Impression   Patient evaluated by Physical Therapy with no further acute PT needs identified. All education has been completed and the patient has no further questions.  See below for any follow-up Physical Therapy or equipment needs. PT is signing off. Thank you for this referral.  Pt continues to have mobility deficits which will be addressed by HHPT; One time eval secondary to imminent discharge today.     Follow Up Recommendations Home health PT;Supervision/Assistance - 24 hour Strongly encouraged pt to follow-up with Primary Care Physician, and consider Ortho evaluation of his shoulder, with possible Outpt PT    Equipment Recommendations  None recommended by PT     Recommendations for Other Services       Precautions / Restrictions Precautions Precautions: Fall      Mobility  Bed Mobility Overal bed mobility: Needs Assistance Bed Mobility: Supine to Sit;Sit to Supine     Supine to sit: Min assist Sit to supine: Supervision   General bed mobility comments: handheld assist to pull to sit  Transfers Overall transfer level: Needs assistance Equipment used: Rolling walker (2 wheeled) Transfers: Sit to/from Stand Sit to Stand: Min guard         General transfer comment: VCs for safe hand placement and positioning with use of RW, assist for stability  Ambulation/Gait Ambulation/Gait assistance: Min guard (with and without physical contact) Ambulation Distance (Feet): 120 Feet Assistive device: Rolling walker (2 wheeled) Gait Pattern/deviations: Step-through pattern Gait velocity: decreased   General Gait Details: patient very impulsive with ambulation, question baseline cognition; Still no gross loss of balance  Stairs            Wheelchair Mobility    Modified Rankin (Stroke Patients Only)       Balance     Sitting balance-Leahy Scale: Good       Standing balance-Leahy Scale: Fair                               Pertinent Vitals/Pain Pain Assessment: Faces Faces Pain Scale: Hurts little more Pain Location: LUE: wrist pain with Weight bearing ; shoulder pain with elevation Pain Descriptors / Indicators: Grimacing Pain Intervention(s): Monitored during session    Home Living Family/patient expects to be discharged to:: Private residence Living Arrangements: Children Available Help at  Discharge: Family;Available 24 hours/day Type of Home: Apartment Home Access: Level entry (per pt)     Home Layout: One level Home Equipment: Walker - 2 wheels;Bedside commode (shower chair)      Prior Function Level of Independence: Needs assistance   Gait / Transfers Assistance Needed: with RW      Comments: Aide was setup last admission (approx 1 week ago)     Hand Dominance   Dominant Hand: Right    Extremity/Trunk Assessment   Upper Extremity Assessment: LUE deficits/detail       LUE Deficits / Details: Pain with L finger extension, wrist ext and Weight bearing ; Limited shoulder flexion   Lower Extremity Assessment: Generalized weakness         Communication   Communication: No difficulties  Cognition Arousal/Alertness: Awake/alert Behavior During Therapy: WFL for tasks assessed/performed Overall Cognitive Status: No family/caregiver present to determine baseline cognitive functioning                      General Comments      Exercises        Assessment/Plan    PT Assessment All further PT needs can be met in the next venue of care  PT Diagnosis Difficulty walking;Abnormality of gait;Generalized weakness;Acute pain   PT Problem List Decreased strength;Decreased range of motion;Decreased activity tolerance;Decreased balance;Decreased mobility;Decreased coordination;Decreased cognition;Decreased knowledge of use of DME;Pain  PT Treatment Interventions     PT Goals (Current goals can be found in the Care Plan section) Acute Rehab PT Goals Patient Stated Goal: to go home PT Goal Formulation: No goals set, d/c therapy (Imminent discharge)    Frequency     Barriers to discharge        Co-evaluation               End of Session Equipment Utilized During Treatment: Gait belt Activity Tolerance: Patient tolerated treatment well Patient left: in bed;with call bell/phone within reach;with bed alarm set Nurse Communication: Mobility status         Time: 1137-1203 PT Time Calculation (min): 26 min   Charges:   PT Evaluation $Initial PT Evaluation Tier I: 1 Procedure PT Treatments $Gait Training: 8-22 mins   PT G Codes:          Quin Hoop 01/29/2014, 1:26 PM  Roney [place], [place] Pager [phone] Office [phone]

## 2014-01-29 NOTE — Discharge Instructions (Signed)
Follow with Primary MD Barbette Merino, MD in 3 days   Get CBC, CMP, Ammonia,  2 view Chest X ray checked  by Primary MD next visit.    Activity: As tolerated with Full fall precautions use walker/cane & assistance as needed   Disposition Home **   Diet: Heart Healthy Low carb with feeding assistance and aspiration precautions.  Accuchecks 4 times/day, Once in AM empty stomach and then before each meal. Log in all results and show them to your Prim.MD in 3 days. If any glucose reading is under 80 or above 300 call your Prim MD immidiately. Follow Low glucose instructions for glucose under 80 as instructed.    For Heart failure patients - Check your Weight same time everyday, if you gain over 2 pounds, or you develop in leg swelling, experience more shortness of breath or chest pain, call your Primary MD immediately. Follow Cardiac Low Salt Diet and 1.8 lit/day fluid restriction.   On your next visit with her primary care physician please Get Medicines reviewed and adjusted.  Please request your Prim.MD to go over all Hospital Tests and Procedure/Radiological results at the follow up, please get all Hospital records sent to your Prim MD by signing hospital release before you go home.   If you experience worsening of your admission symptoms, develop shortness of breath, life threatening emergency, suicidal or homicidal thoughts you must seek medical attention immediately by calling 911 or calling your MD immediately  if symptoms less severe.  You Must read complete instructions/literature along with all the possible adverse reactions/side effects for all the Medicines you take and that have been prescribed to you. Take any new Medicines after you have completely understood and accpet all the possible adverse reactions/side effects.   Do not drive, operating heavy machinery, perform activities at heights, swimming or participation in water activities or provide baby sitting services if your  were admitted for syncope or siezures until you have seen by Primary MD or a Neurologist and advised to do so again.  Do not drive when taking Pain medications.    Do not take more than prescribed Pain, Sleep and Anxiety Medications  Special Instructions: If you have smoked or chewed Tobacco  in the last 2 yrs please stop smoking, stop any regular Alcohol  and or any Recreational drug use.  Wear Seat belts while driving.   Please note  You were cared for by a hospitalist during your hospital stay. If you have any questions about your discharge medications or the care you received while you were in the hospital after you are discharged, you can call the unit and asked to speak with the hospitalist on call if the hospitalist that took care of you is not available. Once you are discharged, your primary care physician will handle any further medical issues. Please note that NO REFILLS for any discharge medications will be authorized once you are discharged, as it is imperative that you return to your primary care physician (or establish a relationship with a primary care physician if you do not have one) for your aftercare needs so that they can reassess your need for medications and monitor your lab values.

## 2014-01-31 DIAGNOSIS — Z008 Encounter for other general examination: Secondary | ICD-10-CM | POA: Diagnosis present

## 2014-01-31 DIAGNOSIS — Z8673 Personal history of transient ischemic attack (TIA), and cerebral infarction without residual deficits: Secondary | ICD-10-CM | POA: Diagnosis not present

## 2014-01-31 DIAGNOSIS — F329 Major depressive disorder, single episode, unspecified: Secondary | ICD-10-CM | POA: Diagnosis not present

## 2014-01-31 DIAGNOSIS — J438 Other emphysema: Secondary | ICD-10-CM | POA: Insufficient documentation

## 2014-01-31 DIAGNOSIS — G8929 Other chronic pain: Secondary | ICD-10-CM | POA: Insufficient documentation

## 2014-01-31 DIAGNOSIS — R45851 Suicidal ideations: Secondary | ICD-10-CM | POA: Diagnosis not present

## 2014-01-31 DIAGNOSIS — F3289 Other specified depressive episodes: Secondary | ICD-10-CM | POA: Insufficient documentation

## 2014-01-31 DIAGNOSIS — Z862 Personal history of diseases of the blood and blood-forming organs and certain disorders involving the immune mechanism: Secondary | ICD-10-CM | POA: Diagnosis not present

## 2014-01-31 DIAGNOSIS — I10 Essential (primary) hypertension: Secondary | ICD-10-CM | POA: Diagnosis not present

## 2014-01-31 DIAGNOSIS — Z79899 Other long term (current) drug therapy: Secondary | ICD-10-CM | POA: Diagnosis not present

## 2014-01-31 DIAGNOSIS — Z794 Long term (current) use of insulin: Secondary | ICD-10-CM | POA: Insufficient documentation

## 2014-01-31 DIAGNOSIS — N289 Disorder of kidney and ureter, unspecified: Secondary | ICD-10-CM | POA: Insufficient documentation

## 2014-01-31 DIAGNOSIS — F1310 Sedative, hypnotic or anxiolytic abuse, uncomplicated: Secondary | ICD-10-CM | POA: Diagnosis not present

## 2014-01-31 DIAGNOSIS — R29898 Other symptoms and signs involving the musculoskeletal system: Secondary | ICD-10-CM | POA: Insufficient documentation

## 2014-01-31 DIAGNOSIS — K219 Gastro-esophageal reflux disease without esophagitis: Secondary | ICD-10-CM | POA: Diagnosis not present

## 2014-01-31 DIAGNOSIS — F17200 Nicotine dependence, unspecified, uncomplicated: Secondary | ICD-10-CM | POA: Diagnosis not present

## 2014-01-31 DIAGNOSIS — Z792 Long term (current) use of antibiotics: Secondary | ICD-10-CM | POA: Insufficient documentation

## 2014-01-31 DIAGNOSIS — E119 Type 2 diabetes mellitus without complications: Secondary | ICD-10-CM | POA: Diagnosis not present

## 2014-01-31 DIAGNOSIS — Z791 Long term (current) use of non-steroidal anti-inflammatories (NSAID): Secondary | ICD-10-CM | POA: Diagnosis not present

## 2014-02-01 ENCOUNTER — Emergency Department (HOSPITAL_COMMUNITY)
Admission: EM | Admit: 2014-02-01 | Discharge: 2014-02-01 | Disposition: A | Discharge status: Home / Self Care | Payer: Medicare PPO | Attending: Emergency Medicine | Admitting: Emergency Medicine

## 2014-02-01 ENCOUNTER — Encounter (HOSPITAL_COMMUNITY): Payer: Self-pay | Admitting: Emergency Medicine

## 2014-02-01 DIAGNOSIS — N289 Disorder of kidney and ureter, unspecified: Secondary | ICD-10-CM

## 2014-02-01 DIAGNOSIS — F32A Depression, unspecified: Secondary | ICD-10-CM

## 2014-02-01 DIAGNOSIS — F1010 Alcohol abuse, uncomplicated: Secondary | ICD-10-CM

## 2014-02-01 DIAGNOSIS — F329 Major depressive disorder, single episode, unspecified: Secondary | ICD-10-CM

## 2014-02-01 DIAGNOSIS — R45851 Suicidal ideations: Secondary | ICD-10-CM

## 2014-02-01 LAB — COMPREHENSIVE METABOLIC PANEL
ALK PHOS: 85 U/L (ref 39–117)
ALT: 23 U/L (ref 0–53)
ANION GAP: 9 (ref 5–15)
AST: 49 U/L — AB (ref 0–37)
Albumin: 2.7 g/dL — ABNORMAL LOW (ref 3.5–5.2)
BUN: 16 mg/dL (ref 6–23)
CHLORIDE: 106 meq/L (ref 96–112)
CO2: 23 mEq/L (ref 19–32)
Calcium: 8.7 mg/dL (ref 8.4–10.5)
Creatinine, Ser: 1.72 mg/dL — ABNORMAL HIGH (ref 0.50–1.35)
GFR calc Af Amer: 49 mL/min — ABNORMAL LOW (ref >90)
GFR calc non Af Amer: 42 mL/min — ABNORMAL LOW (ref >90)
Glucose, Bld: 147 mg/dL — ABNORMAL HIGH (ref 70–99)
POTASSIUM: 4.9 meq/L (ref 3.7–5.3)
Sodium: 138 mEq/L (ref 137–147)
TOTAL PROTEIN: 6.3 g/dL (ref 6.0–8.3)
Total Bilirubin: 2 mg/dL — ABNORMAL HIGH (ref 0.3–1.2)

## 2014-02-01 LAB — BASIC METABOLIC PANEL
Anion gap: 12 (ref 5–15)
BUN: 15 mg/dL (ref 6–23)
CO2: 19 mEq/L (ref 19–32)
CREATININE: 1.34 mg/dL (ref 0.50–1.35)
Calcium: 8.3 mg/dL — ABNORMAL LOW (ref 8.4–10.5)
Chloride: 108 mEq/L (ref 96–112)
GFR calc Af Amer: 66 mL/min — ABNORMAL LOW (ref >90)
GFR, EST NON AFRICAN AMERICAN: 57 mL/min — AB (ref >90)
Glucose, Bld: 146 mg/dL — ABNORMAL HIGH (ref 70–99)
Potassium: 4.4 mEq/L (ref 3.7–5.3)
Sodium: 139 mEq/L (ref 137–147)

## 2014-02-01 LAB — CBC
HCT: 29.7 % — ABNORMAL LOW (ref 39.0–52.0)
Hemoglobin: 9.8 g/dL — ABNORMAL LOW (ref 13.0–17.0)
MCH: 31.2 pg (ref 26.0–34.0)
MCHC: 33 g/dL (ref 30.0–36.0)
MCV: 94.6 fL (ref 78.0–100.0)
PLATELETS: 52 10*3/uL — AB (ref 150–400)
RBC: 3.14 MIL/uL — ABNORMAL LOW (ref 4.22–5.81)
RDW: 17.2 % — ABNORMAL HIGH (ref 11.5–15.5)
WBC: 4.3 10*3/uL (ref 4.0–10.5)

## 2014-02-01 LAB — RAPID URINE DRUG SCREEN, HOSP PERFORMED
Amphetamines: NOT DETECTED
BENZODIAZEPINES: POSITIVE — AB
Barbiturates: NOT DETECTED
COCAINE: NOT DETECTED
OPIATES: NOT DETECTED
Tetrahydrocannabinol: NOT DETECTED

## 2014-02-01 LAB — CBG MONITORING, ED
Glucose-Capillary: 110 mg/dL — ABNORMAL HIGH (ref 70–99)
Glucose-Capillary: 123 mg/dL — ABNORMAL HIGH (ref 70–99)
Glucose-Capillary: 194 mg/dL — ABNORMAL HIGH (ref 70–99)

## 2014-02-01 LAB — AMMONIA: Ammonia: 62 umol/L — ABNORMAL HIGH (ref 11–60)

## 2014-02-01 LAB — SALICYLATE LEVEL: Salicylate Lvl: <2 mg/dL — ABNORMAL LOW (ref 2.8–20.0)

## 2014-02-01 LAB — ETHANOL

## 2014-02-01 LAB — ACETAMINOPHEN LEVEL

## 2014-02-01 MED ORDER — AMITRIPTYLINE HCL 25 MG PO TABS: 25.0000 mg | ORAL_TABLET | Freq: Every day | ORAL | Status: DC

## 2014-02-01 MED ORDER — RIFAXIMIN 550 MG PO TABS
550.0000 mg | ORAL_TABLET | Freq: Two times a day (BID) | ORAL | Status: DC
Start: 2014-02-01 — End: 2014-02-01
  Administered 2014-02-01: 550 mg via ORAL
  Filled 2014-02-01 (×2): qty 1

## 2014-02-01 MED ORDER — ALPRAZOLAM 0.5 MG PO TABS: 1.0000 mg | ORAL_TABLET | Freq: Every evening | ORAL | Status: DC | PRN

## 2014-02-01 MED ORDER — PROPRANOLOL HCL 20 MG PO TABS
20.0000 mg | ORAL_TABLET | Freq: Every day | ORAL | Status: DC
  Administered 2014-02-01: 20 mg via ORAL
  Filled 2014-02-01 (×2): qty 1

## 2014-02-01 MED ORDER — PANTOPRAZOLE SODIUM 40 MG PO TBEC: 40.0000 mg | DELAYED_RELEASE_TABLET | Freq: Every day | ORAL | Status: DC

## 2014-02-01 MED ORDER — SODIUM CHLORIDE 0.9 % IV BOLUS (SEPSIS)
1000.0000 mL | Freq: Once | INTRAVENOUS | Status: AC
  Administered 2014-02-01: 1000 mL via INTRAVENOUS

## 2014-02-01 MED ORDER — LORAZEPAM 1 MG PO TABS: 1.0000 mg | ORAL_TABLET | Freq: Three times a day (TID) | ORAL | Status: DC | PRN

## 2014-02-01 MED ORDER — LACTULOSE 10 GM/15ML PO SOLN
30.0000 g | Freq: Three times a day (TID) | ORAL | Status: DC
  Administered 2014-02-01: 30 g via ORAL
  Filled 2014-02-01 (×3): qty 45

## 2014-02-01 MED ORDER — INSULIN ASPART 100 UNIT/ML ~~LOC~~ SOLN
6.0000 [IU] | Freq: Three times a day (TID) | SUBCUTANEOUS | Status: DC
  Administered 2014-02-01 (×2): 6 [IU] via SUBCUTANEOUS
  Filled 2014-02-01: qty 1

## 2014-02-01 MED ORDER — ALUM & MAG HYDROXIDE-SIMETH 200-200-20 MG/5ML PO SUSP: 30.0000 mL | ORAL | Status: DC | PRN

## 2014-02-01 MED ORDER — ONDANSETRON HCL 4 MG PO TABS
4.0000 mg | ORAL_TABLET | Freq: Three times a day (TID) | ORAL | Status: DC | PRN
Start: 2014-02-01 — End: 2014-02-01

## 2014-02-01 MED ORDER — DICLOFENAC SODIUM 1 % TD GEL
1.0000 | Freq: Two times a day (BID) | TRANSDERMAL | Status: DC | PRN
Start: 2014-02-01 — End: 2014-02-01
  Filled 2014-02-01: qty 100

## 2014-02-01 MED ORDER — OXYCODONE HCL 5 MG PO TABS
10.0000 mg | ORAL_TABLET | Freq: Three times a day (TID) | ORAL | Status: DC
  Administered 2014-02-01: 10 mg via ORAL
  Filled 2014-02-01: qty 2

## 2014-02-01 MED ORDER — NICOTINE 21 MG/24HR TD PT24
21.0000 mg | MEDICATED_PATCH | Freq: Every day | TRANSDERMAL | Status: DC
  Administered 2014-02-01: 21 mg via TRANSDERMAL
  Filled 2014-02-01: qty 1

## 2014-02-01 MED ORDER — SPIRONOLACTONE 50 MG PO TABS
50.0000 mg | ORAL_TABLET | Freq: Every day | ORAL | Status: DC
  Administered 2014-02-01: 50 mg via ORAL
  Filled 2014-02-01: qty 1

## 2014-02-01 MED ORDER — INSULIN GLARGINE 100 UNIT/ML ~~LOC~~ SOLN
45.0000 [IU] | Freq: Every day | SUBCUTANEOUS | Status: DC
Start: 2014-02-01 — End: 2014-02-01
  Filled 2014-02-01: qty 0.45

## 2014-02-01 NOTE — ED Notes (Signed)
Pt ate 100% of his meal .

## 2014-02-01 NOTE — ED Notes (Signed)
Discharge instructions given. Pt still making arrangements for ride home. Vw, rn.

## 2014-02-01 NOTE — ED Notes (Signed)
Daughter [name] called to receive update on father. She can be contacted at [phone] if any one has questions or updates.

## 2014-02-01 NOTE — BH Assessment (Signed)
Spoke with Alecia Lemming PA-C,EDP prior to assessment. Per Merrily Pew PA-C pt needs fluids due to kidney function. Worsening depression, and says if he does not get help with his mental health concerns he is going to kill himself. He has a lot of medical conditions and reports stressors at home which are increasing his depression.   Assessment to commence shortly.   Lear Ng, [facility] Triage Specialist 02/01/2014 3:26 AM

## 2014-02-01 NOTE — Consult Note (Signed)
[place] [facility]iat[facility] Consult   Reason for Consult:  Alcohol abuse Referring Physician:  EDP  [name] is an 58 y.o. male. Total Time spent with patient: 20 minutes  Assessment: AXIS I:  Alcohol Abuse AXIS II:  Deferred AXIS III:   Past Medical History  Diagnosis Date  . Neuropathy   . Diabetes mellitus   . Bipolar affect, depressed   . Hypertension   . Arthritis   . Stroke     Mini stroke about 22yr ago  . Cirrhosis   . Alcohol abuse   . Chronic pain   . Cocaine abuse   . Muscle spasm     both legs  . Encephalopathy, hepatic   . Detached retina   . COPD (chronic obstructive pulmonary disease)     emphysema  . Bronchitis   . Barrett's esophagus   . GERD (gastroesophageal reflux disease)     has ulcer  . Anemia    AXIS IV:  other psychosocial or environmental problems and problems related to social environment AXIS V:  61-70 mild symptoms  Plan:  No evidence of imminent risk to self or others at present.  Dr. JLouretta Shortenassessed the patient and concurs with the plan.  Subjective:   Darrell BROZis a 58y.o. male patient does not warrant admission.  HPI:  The patient initially wanted to go to groups and get some therapy.  His last alcohol binge was four days ago and he had one beer two days ago.  He now wants to return home and be with his daughter and grandchildren.  [place].  Denies suicidal/homicidal ideations, hallucinations, and drug abuse.  He states he wants to live for his grandchildren, no desire to die. HPI Elements:   Location:  generalized. Quality:  acute. Severity:  mild. Timing:  brief. Duration:  brief. Context:  stressors.  Past Psychiatric History: Past Medical History  Diagnosis Date  . Neuropathy   . Diabetes mellitus   . Bipolar affect, depressed   . Hypertension   . Arthritis   . Stroke     Mini stroke about 54yrago  . Cirrhosis   . Alcohol abuse   .  Chronic pain   . Cocaine abuse   . Muscle spasm     both legs  . Encephalopathy, hepatic   . Detached retina   . COPD (chronic obstructive pulmonary disease)     emphysema  . Bronchitis   . Barrett's esophagus   . GERD (gastroesophageal reflux disease)     has ulcer  . Anemia     reports that he has been smoking Cigarettes.  He has a 30 pack-year smoking history. He has never used smokeless tobacco. He reports that he drinks alcohol. He reports that he uses illicit drugs (Cocaine). Family History  Problem Relation Age of Onset  . Hypotension Mother    Family History Substance Abuse: Yes, Describe: (father etoh) Family Supports: Yes, List: (daughter) Living Arrangements: Children;Other relatives (daughter, and 3 grandkids) Can pt return to current living arrangement?: Yes [facility]: Denies Verbal Abuse: Denies Sexual Abuse: Denies Allergies:  No Known Allergies  ACT Assessment Complete:  Yes:    Educational Status    Risk to Self: Risk to self with the past 6 months Suicidal Ideation: Yes-Currently Present Suicidal Intent: Yes-Currently Present Is patient at risk for suicide?: Yes Suicidal Plan?: Yes-Currently Present Specify Current Suicidal  Plan: walk in front of bus or train Access to Means: Yes Specify Access to Suicidal Means: train or bus, difficult to access due to mobility issues What has been your use of drugs/alcohol within the last 12 months?: Pt reports history of abusing etoh and cocaine. Pt reports he has been sober since he was discharged from [facility] about a year ago. Previous Attempts/Gestures: No How many times?: 0 Other Self Harm Risks: none Triggers for Past Attempts: None known Intentional Self Injurious Behavior: None Family Suicide History: No Recent stressful life event(s):  (gf cheating on him, health problems, pain) Persecutory voices/beliefs?: No Depression: Yes Depression Symptoms: Despondent;Tearfulness;Insomnia;Loss  of interest in usual pleasures;Feeling worthless/self pity;Feeling angry/irritable Substance abuse history and/or treatment for substance abuse?: Yes Suicide prevention information given to non-admitted patients: Not applicable  Risk to Others: Risk to Others within the past 6 months Homicidal Ideation: No Thoughts of Harm to Others: No Current Homicidal Intent: No Current Homicidal Plan: No Access to Homicidal Means: No Identified Victim: none History of harm to others?: No Assessment of Violence: None Noted Violent Behavior Description: none Does patient have access to weapons?: No Criminal Charges Pending?: No Does patient have a court date: No  Abuse: Abuse/Neglect Assessment (Assessment to be complete while patient is alone) Physical Abuse: Denies Verbal Abuse: Denies Sexual Abuse: Denies Exploitation of patient/patient's resources: Denies Self-Neglect: Denies  Prior Inpatient Therapy: Prior Inpatient Therapy Prior Inpatient Therapy: Yes Prior Therapy Dates: [place] 2014, [place] about 4 years ago Prior Therapy Facilty/Provider(s): [facility], [place] Reason for Treatment: SA  Prior Outpatient Therapy: Prior Outpatient Therapy Prior Outpatient Therapy: No Prior Therapy Dates: none Prior Therapy Facilty/Provider(s): none Reason for Treatment: none  Additional Information: Additional Information 1:1 In Past 12 Months?: No CIRT Risk: No Elopement Risk: No Does patient have medical clearance?: No (still getting fluids)                  Objective: Blood pressure 117/65, pulse 67, temperature 97.7 F (36.5 C), temperature source Oral, resp. rate 16, height _0  (1.778 m), weight 219 lb (99.338 kg), SpO2 100.00%.Body mass index is 31.42 kg/(m^2). Results for orders placed during the hospital encounter of 02/01/14 (from the past 72 hour(s))  ACETAMINOPHEN LEVEL     Status: None   Collection Time    02/01/14  1:51 AM      Result Value Ref Range   Acetaminophen (Tylenol), Serum  <15.0  10 - 30 ug/mL   Comment:            THERAPEUTIC CONCENTRATIONS VARY     SIGNIFICANTLY. A RANGE OF 10-30     ug/mL MAY BE AN EFFECTIVE     CONCENTRATION FOR MANY PATIENTS.     HOWEVER, SOME ARE BEST TREATED     AT CONCENTRATIONS OUTSIDE THIS     RANGE.     ACETAMINOPHEN CONCENTRATIONS     >150 ug/mL AT 4 HOURS AFTER     INGESTION AND >50 ug/mL AT 12     HOURS AFTER INGESTION ARE     OFTEN ASSOCIATED WITH TOXIC     REACTIONS.  CBC     Status: Abnormal   Collection Time    02/01/14  1:51 AM      Result Value Ref Range   WBC 4.3  4.0 - 10.5 K/uL   RBC 3.14 (*) 4.22 - 5.81 MIL/uL   Hemoglobin 9.8 (*) 13.0 - 17.0 g/dL   HCT 29.7 (*) 39.0 - 52.0 %   MCV  94.6  78.0 - 100.0 fL   MCH 31.2  26.0 - 34.0 pg   MCHC 33.0  30.0 - 36.0 g/dL   RDW 17.2 (*) 11.5 - 15.5 %   Platelets 52 (*) 150 - 400 K/uL   Comment: SPECIMEN CHECKED FOR CLOTS     REPEATED TO VERIFY     PLATELET COUNT CONFIRMED BY SMEAR  COMPREHENSIVE METABOLIC PANEL     Status: Abnormal   Collection Time    02/01/14  1:51 AM      Result Value Ref Range   Sodium 138  137 - 147 mEq/L   Potassium 4.9  3.7 - 5.3 mEq/L   Chloride 106  96 - 112 mEq/L   CO2 23  19 - 32 mEq/L   Glucose, Bld 147 (*) 70 - 99 mg/dL   BUN 16  6 - 23 mg/dL   Creatinine, Ser 1.72 (*) 0.50 - 1.35 mg/dL   Calcium 8.7  8.4 - 10.5 mg/dL   Total Protein 6.3  6.0 - 8.3 g/dL   Albumin 2.7 (*) 3.5 - 5.2 g/dL   AST 49 (*) 0 - 37 U/L   ALT 23  0 - 53 U/L   Alkaline Phosphatase 85  39 - 117 U/L   Total Bilirubin 2.0 (*) 0.3 - 1.2 mg/dL   GFR calc non Af Amer 42 (*) >90 mL/min   GFR calc Af Amer 49 (*) >90 mL/min   Comment: (NOTE)     The eGFR has been calculated using the CKD EPI equation.     This calculation has not been validated in all clinical situations.     eGFR's persistently <90 mL/min signify possible Chronic Kidney     Disease.   Anion gap 9  5 - 15  ETHANOL     Status: None   Collection Time    02/01/14  1:51 AM      Result Value  Ref Range   Alcohol, Ethyl (B) <11  0 - 11 mg/dL   Comment:            LOWEST DETECTABLE LIMIT FOR     SERUM ALCOHOL IS 11 mg/dL     FOR MEDICAL PURPOSES ONLY  SALICYLATE LEVEL     Status: Abnormal   Collection Time    02/01/14  1:51 AM      Result Value Ref Range   Salicylate Lvl <8.2 (*) 2.8 - 20.0 mg/dL  AMMONIA     Status: Abnormal   Collection Time    02/01/14  1:51 AM      Result Value Ref Range   Ammonia 62 (*) 11 - 60 umol/L  URINE RAPID DRUG SCREEN (HOSP PERFORMED)     Status: Abnormal   Collection Time    02/01/14  2:21 AM      Result Value Ref Range   Opiates NONE DETECTED  NONE DETECTED   Cocaine NONE DETECTED  NONE DETECTED   Benzodiazepines POSITIVE (*) NONE DETECTED   Amphetamines NONE DETECTED  NONE DETECTED   Tetrahydrocannabinol NONE DETECTED  NONE DETECTED   Barbiturates NONE DETECTED  NONE DETECTED   Comment:            DRUG SCREEN FOR MEDICAL PURPOSES     ONLY.  IF CONFIRMATION IS NEEDED     FOR ANY PURPOSE, NOTIFY LAB     WITHIN 5 DAYS.                LOWEST DETECTABLE LIMITS  FOR URINE DRUG SCREEN     Drug Class       Cutoff (ng/mL)     Amphetamine      1000     Barbiturate      200     Benzodiazepine   867     Tricyclics       619     Opiates          300     Cocaine          300     THC              50  CBG MONITORING, ED     Status: Abnormal   Collection Time    02/01/14  4:12 AM      Result Value Ref Range   Glucose-Capillary 110 (*) 70 - 99 mg/dL  CBG MONITORING, ED     Status: Abnormal   Collection Time    02/01/14  8:36 AM      Result Value Ref Range   Glucose-Capillary 123 (*) 70 - 99 mg/dL   Comment 1 Documented in Chart     Comment 2 Notify RN    BASIC METABOLIC PANEL     Status: Abnormal   Collection Time    02/01/14  9:15 AM      Result Value Ref Range   Sodium 139  137 - 147 mEq/L   Potassium 4.4  3.7 - 5.3 mEq/L   Chloride 108  96 - 112 mEq/L   CO2 19  19 - 32 mEq/L   Glucose, Bld 146 (*) 70 - 99 mg/dL   BUN 15  6  - 23 mg/dL   Creatinine, Ser 1.34  0.50 - 1.35 mg/dL   Calcium 8.3 (*) 8.4 - 10.5 mg/dL   GFR calc non Af Amer 57 (*) >90 mL/min   GFR calc Af Amer 66 (*) >90 mL/min   Comment: (NOTE)     The eGFR has been calculated using the CKD EPI equation.     This calculation has not been validated in all clinical situations.     eGFR's persistently <90 mL/min signify possible Chronic Kidney     Disease.   Anion gap 12  5 - 15  CBG MONITORING, ED     Status: Abnormal   Collection Time    02/01/14 12:39 PM      Result Value Ref Range   Glucose-Capillary 194 (*) 70 - 99 mg/dL   Labs are reviewed and are pertinent for no medical issues noted.  Current Facility-Administered Medications  Medication Dose Route Frequency Provider Last Rate Last Dose  . ALPRAZolam (XANAX) tablet 1 mg  1 mg Oral QHS PRN Carlisle Cater, PA-C      . alum & mag hydroxide-simeth (MAALOX/MYLANTA) 200-200-20 MG/5ML suspension 30 mL  30 mL Oral PRN Carlisle Cater, PA-C      . amitriptyline (ELAVIL) tablet 25 mg  25 mg Oral QHS Carlisle Cater, PA-C      . diclofenac sodium (VOLTAREN) 1 % transdermal gel 1 application  1 application Topical BID PRN Carlisle Cater, PA-C      . insulin aspart (novoLOG) injection 6 Units  6 Units Subcutaneous TID WC Carlisle Cater, PA-C   6 Units at 02/01/14 1247  . insulin glargine (LANTUS) injection 45 Units  45 Units Subcutaneous QHS Carlisle Cater, PA-C      . lactulose (CHRONULAC) 10 GM/15ML solution 30 g  30 g Oral TID Carlisle Cater,  PA-C   30 g at 02/01/14 0910  . LORazepam (ATIVAN) tablet 1 mg  1 mg Oral Q8H PRN Carlisle Cater, PA-C      . nicotine (NICODERM CQ - dosed in mg/24 hours) patch 21 mg  21 mg Transdermal Daily Carlisle Cater, PA-C   21 mg at 02/01/14 0910  . ondansetron (ZOFRAN) tablet 4 mg  4 mg Oral Q8H PRN Carlisle Cater, PA-C      . oxyCODONE (Oxy IR/ROXICODONE) immediate release tablet 10 mg  10 mg Oral TID Carlisle Cater, PA-C   10 mg at 02/01/14 0913  . pantoprazole (PROTONIX) EC  tablet 40 mg  40 mg Oral QHS Carlisle Cater, PA-C      . propranolol (INDERAL) tablet 20 mg  20 mg Oral Daily Carlisle Cater, PA-C   20 mg at 02/01/14 1247  . rifaximin (XIFAXAN) tablet 550 mg  550 mg Oral BID Carlisle Cater, PA-C   550 mg at 02/01/14 1113  . spironolactone (ALDACTONE) tablet 50 mg  50 mg Oral Daily Carlisle Cater, PA-C   50 mg at 02/01/14 1113   Current Outpatient Prescriptions  Medication Sig Dispense Refill  . ALPRAZolam (XANAX) 0.5 MG tablet Take 2 tablets (1 mg total) by mouth at bedtime as needed for anxiety.  30 tablet  0  . amitriptyline (ELAVIL) 25 MG tablet Take 25 mg by mouth at bedtime.       . diclofenac sodium (VOLTAREN) 1 % GEL Apply 1 application topically 2 (two) times daily as needed (pain).       . furosemide (LASIX) 20 MG tablet Take 1 tablet (20 mg total) by mouth daily.  30 tablet  0  . insulin aspart (NOVOLOG FLEXPEN) 100 UNIT/ML FlexPen Inject 6 Units into the skin 3 (three) times daily with meals.      . Insulin Glargine (LANTUS SOLOSTAR) 100 UNIT/ML Solostar Pen Inject 45 Units into the skin at bedtime.  15 mL  0  . lactulose (CHRONULAC) 10 GM/15ML solution Take 45 mLs (30 g total) by mouth 3 (three) times daily. Titrate to at least 3 BMs daily  240 mL  0  . Oxycodone HCl 10 MG TABS Take 10 mg by mouth 3 (three) times daily. scheduled      . pantoprazole (PROTONIX) 40 MG tablet Take 40 mg by mouth at bedtime.       . propranolol (INDERAL) 20 MG tablet Take 20 mg by mouth daily.       . rifaximin (XIFAXAN) 550 MG TABS tablet Take 1 tablet (550 mg total) by mouth 2 (two) times daily.  60 tablet  1  . spironolactone (ALDACTONE) 50 MG tablet Take 1 tablet (50 mg total) by mouth daily.  30 tablet  0    Psychiatric Specialty Exam:     Blood pressure 117/65, pulse 67, temperature 97.7 F (36.5 C), temperature source Oral, resp. rate 16, height _0  (1.778 m), weight 219 lb (99.338 kg), SpO2 100.00%.Body mass index is 31.42 kg/(m^2).  General Appearance:  Casual  Eye Contact::  Good  Speech:  Normal Rate  Volume:  Normal  Mood:  Anxious  Affect:  Congruent  Thought Process:  Coherent  Orientation:  Full (Time, Place, and Person)  Thought Content:  WDL  Suicidal Thoughts:  No  Homicidal Thoughts:  No  Memory:  Immediate;   Good Recent;   Good Remote;   Good  Judgement:  Fair  Insight:  Fair  Psychomotor Activity:  Normal  Concentration:  Good  Recall:  Good  Fund of Knowledge:Good  Language: Good  Akathisia:  No  Handed:  Right  AIMS (if indicated):     Assets:  Communication Skills Desire for Improvement Financial Resources/Insurance Housing Leisure Time Resilience Social Support Transportation  Sleep:      Musculoskeletal: Strength & Muscle Tone: within normal limits Gait & Station: normal Patient leans: N/A  Treatment Plan Summary: Discharge home with follow-up with AA and outside providers, resources given.  Waylan Boga, [place] 02/01/2014 3:49 PM  Patient is seen face-to-face for psychiatric evaluation and case discussed with physician extender and treatment team, and formulated treatment plan. Reviewed the information documented and agree with the treatment plan.  Maleta Pacha,JANARDHAHA R. 02/01/2014 6:12 PM

## 2014-02-01 NOTE — ED Notes (Signed)
Pt gave RN verbal consent to speak with daughter, Probation officer called daughter and made her aware her father will call and update her after the doctor evaluates him. Message was relayed to daughter, she was very thankful for the follow up call.

## 2014-02-01 NOTE — BH Assessment (Signed)
Tele Assessment Note   [name] is an 58 y.o. male admitted to [facility] 9/14 for alcohol use disorder, cocaine use disorder and bipolar. Pt reports he has been clean an sober since last hospitalization, and has an [place] [employer] sponsor. Pt sees his PCP Dr. Jonelle Sidle for his medication management. Pt reports his doctor is currently out of the country and that pt would not have needed to come to ED if doctor was available. Pt reports he is working with his PCP to get a counselor and a psychiatrist so his medications can be adjusted. Pt is scheduled to meet with doctor on Tuesday.   Pt presents to ED due to worsening depression and SI with planning. Pt is alert and oriented times 4. Pt reports he has been feeling worsening depression for 4 days as he caught his girlfriend of 17 years cheating on him. Pt reports that his daughter and her three children moved in with him after this happened and that has helped some. Pt sts he feels like he needs his medications adjusted. He has been thinking it is not worth living due to his recent break up, pain conditions, medical decline, and depression. Pt sts he was thinking of walking in front of a bus or a train. Pt sts he is unable to contract for safety. Pt denies self-harm history, denies HI, denies a/v hallucinations.   Pt reports history of alcohol and cocaine abuse but reports being sober for one year since leaving [facility] last year. Pt denies past suicide attempts. Pt denies history of trauma or abuse. Pt denies current drug or alcohol use.   Pt reports depressive symptoms including staying in bed, trouble sleeping, not eating, SI with planning, ad sadness. Recent increase in depressive symptoms aligns with finding out his girlfriend was cheating on him.  Pt reports history of anxiety and being treated with xanax for more than 15 years. Pt reports occasional panic attacks triggered by stress, the most recent being three days ago.   Axis I: 296.53 Bipolar I, Most recent episode  depressed, severe          303.90 Alcohol Use Disorder, severe, in sustained remission          304.20 Cocaine Use Disorder, Moderate, in sustained remission          300.00 Unspecified Anxiety Disorder, with panic attacks Axis II: Deferred Axis III:  Past Medical History  Diagnosis Date  . Neuropathy   . Diabetes mellitus   . Bipolar affect, depressed   . Hypertension   . Arthritis   . Stroke     Mini stroke about 26yrs ago  . Cirrhosis   . Alcohol abuse   . Chronic pain   . Cocaine abuse   . Muscle spasm     both legs  . Encephalopathy, hepatic   . Detached retina   . COPD (chronic obstructive pulmonary disease)     emphysema  . Bronchitis   . Barrett's esophagus   . GERD (gastroesophageal reflux disease)     has ulcer  . Anemia    Axis IV: problems related to social environment, problems with access to health care services and problems with primary support group Axis V: 31-40   Past Medical History:  Past Medical History  Diagnosis Date  . Neuropathy   . Diabetes mellitus   . Bipolar affect, depressed   . Hypertension   . Arthritis   . Stroke     Mini stroke about 75yrs ago  .  Cirrhosis   . Alcohol abuse   . Chronic pain   . Cocaine abuse   . Muscle spasm     both legs  . Encephalopathy, hepatic   . Detached retina   . COPD (chronic obstructive pulmonary disease)     emphysema  . Bronchitis   . Barrett's esophagus   . GERD (gastroesophageal reflux disease)     has ulcer  . Anemia     Past Surgical History  Procedure Laterality Date  . Fracture surgery      Leg and arm 66yrs ago  . Esophagogastroduodenoscopy  04/04/2012    Procedure: ESOPHAGOGASTRODUODENOSCOPY (EGD);  Surgeon: Irene Shipper, MD;  Location: [facility] ENDOSCOPY;  Service: Endoscopy;  Laterality: N/A;  . Esophagogastroduodenoscopy Left 03/13/2013    Procedure: ESOPHAGOGASTRODUODENOSCOPY (EGD);  Surgeon: Arta Silence, MD;  Location: [facility] ENDOSCOPY;  Service: Endoscopy;  Laterality: Left;  Marland Kitchen  Eye surgery  8 months ago both eyes    cataracts both eyes, detached eye, gas pocket  . Vasectomy    . Pars plana vitrectomy Left 07/08/2013    Procedure: PARS PLANA VITRECTOMY WITH 25 GAUGE;  Surgeon: Hurman Horn, MD;  Location: [place];  Service: Ophthalmology;  Laterality: Left;  . Intraocular lens removal Left 07/08/2013    Procedure: REMOVAL OF INTRAOCULAR LENS;  Surgeon: Hurman Horn, MD;  Location: [place];  Service: Ophthalmology;  Laterality: Left;  . Placement and suture of secondary intraocular lens Left 07/08/2013    Procedure: PLACEMENT AND SUTURE OF SECONDARY INTRAOCULAR LENS;  Surgeon: Hurman Horn, MD;  Location: [place];  Service: Ophthalmology;  Laterality: Left;  Insertion of Anterior Capsule Intraocular Lens   . Esophagogastroduodenoscopy N/A 01/16/2014    Procedure: ESOPHAGOGASTRODUODENOSCOPY (EGD);  Surgeon: Winfield Cunas., MD;  Location: [facility] ENDOSCOPY;  Service: Endoscopy;  Laterality: N/A;  . Colonoscopy N/A 01/17/2014    Procedure: COLONOSCOPY;  Surgeon: Winfield Cunas., MD;  Location: [facility] ENDOSCOPY;  Service: Endoscopy;  Laterality: N/A;  possible banding    Family History:  Family History  Problem Relation Age of Onset  . Hypotension Mother     Social History:  reports that he has been smoking Cigarettes.  He has a 30 pack-year smoking history. He has never used smokeless tobacco. He reports that he drinks alcohol. He reports that he uses illicit drugs (Cocaine).  Additional Social History:  Alcohol / Drug Use Pain Medications: SEE MAR Prescriptions: SEE MAR Over the Counter: SEE MAR History of alcohol / drug use?: Yes Longest period of sobriety (when/how long): 1 year Negative Consequences of Use: Personal relationships;Financial;Work / School (in past) Withdrawal Symptoms:  (n/a currently) Substance #1 Name of Substance 1: alcohol  1 - Last Use / Amount: pt reports he has not used for 2.5 years when he was discharged from [facility] and got an [place] sponsor. Pt was  actually discharged about 1 year ago Substance #2 Name of Substance 2: cocaine 2 - Age of First Use: pt did not wish to talk about past drug use, "I used to use it to be up all night" 2 - Duration: pt reports he has not used for 2.5 years when he was discharged from [facility] and got an [place] sponsor. Pt was actually discharged about 1 year ago 2 - Last Use / Amount: about  1 year ago  CIWA: CIWA-Ar BP: 134/63 mmHg Pulse Rate: 64 COWS:    PATIENT STRENGTHS: (choose at least two) Has been able to maintain sobriety for  nearly a year Communicates with PCP about his mental health needs  Allergies: No Known Allergies  Home Medications:  (Not in a hospital admission)  OB/GYN Status:  No LMP for male patient.  General Assessment Data Location of Assessment: [facility] ED Is this a Tele or Face-to-Face Assessment?: Face-to-Face Is this an Initial Assessment or a Re-assessment for this encounter?: Initial Assessment Living Arrangements: Children;Other relatives (daughter, and 3 grandkids) Can pt return to current living arrangement?: Yes Admission Status: Voluntary Is patient capable of signing voluntary admission?: Yes Transfer from: Home Referral Source: Self/Family/Friend     [place] Living Arrangements: Children;Other relatives (daughter, and 3 grandkids) Name of Psychiatrist: none PCP Dr. Jonelle Sidle prescribes medications Name of Therapist: none  Education Status Is patient currently in school?: No Current Grade: na Highest grade of school patient has completed: 12 Name of school: na Contact person: na  Risk to self with the past 6 months Suicidal Ideation: Yes-Currently Present Suicidal Intent: Yes-Currently Present Is patient at risk for suicide?: Yes Suicidal Plan?: Yes-Currently Present Specify Current Suicidal Plan: walk in front of bus or train Access to Means: Yes Specify Access to Suicidal Means: train or bus, difficult to access due to mobility issues What has been  your use of drugs/alcohol within the last 12 months?: Pt reports history of abusing etoh and cocaine. Pt reports he has been sober since he was discharged from [facility] about a year ago. Previous Attempts/Gestures: No How many times?: 0 Other Self Harm Risks: none Triggers for Past Attempts: None known Intentional Self Injurious Behavior: None Family Suicide History: No Recent stressful life event(s):  (gf cheating on him, health problems, pain) Persecutory voices/beliefs?: No Depression: Yes Depression Symptoms: Despondent;Tearfulness;Insomnia;Loss of interest in usual pleasures;Feeling worthless/self pity;Feeling angry/irritable Substance abuse history and/or treatment for substance abuse?: Yes Suicide prevention information given to non-admitted patients: Not applicable  Risk to Others within the past 6 months Homicidal Ideation: No Thoughts of Harm to Others: No Current Homicidal Intent: No Current Homicidal Plan: No Access to Homicidal Means: No Identified Victim: none History of harm to others?: No Assessment of Violence: None Noted Violent Behavior Description: none Does patient have access to weapons?: No Criminal Charges Pending?: No Does patient have a court date: No  Psychosis Hallucinations: None noted Delusions: None noted  Mental Status Report Appear/Hygiene: Disheveled;In scrubs (scrapes and sores on arms, marks from recent IVs in hosp) Eye Contact: Good Motor Activity: Unremarkable Speech: Logical/coherent Level of Consciousness: Alert Mood: Depressed Affect: Appropriate to circumstance Anxiety Level: Panic Attacks Panic attack frequency: occassional Most recent panic attack: 3 days ago Thought Processes: Coherent;Relevant;Circumstantial Judgement: Partial Orientation: Person;Place;Time;Situation Obsessive Compulsive Thoughts/Behaviors: None  Cognitive Functioning Concentration: Normal Memory: Recent Intact;Remote Intact IQ: Average Insight:  Fair Impulse Control: Fair Appetite: Fair Weight Loss: 0 Weight Gain: 0 Sleep: Decreased Total Hours of Sleep: 0 (last two days) Vegetative Symptoms: Staying in bed  ADLScreening [facility] Assessment Services) Patient's cognitive ability adequate to safely complete daily activities?: Yes Patient able to express need for assistance with ADLs?: Yes Independently performs ADLs?: Yes (appropriate for developmental age) (with assistance devices. )  Prior Inpatient Therapy Prior Inpatient Therapy: Yes Prior Therapy Dates: [place] 2014, [place] about 4 years ago Prior Therapy Facilty/Provider(s): [facility], [place] Reason for Treatment: SA  Prior Outpatient Therapy Prior Outpatient Therapy: No Prior Therapy Dates: none Prior Therapy Facilty/Provider(s): none Reason for Treatment: none  ADL Screening (condition at time of admission) Patient's cognitive ability adequate to safely complete daily activities?: Yes  Is the patient deaf or have difficulty hearing?: Yes (has trouble hearing what this interiviewer asks) Does the patient have difficulty seeing, even when wearing glasses/contacts?: Yes (cataracts, wears glasses) Does the patient have difficulty concentrating, remembering, or making decisions?: Yes Patient able to express need for assistance with ADLs?: Yes Does the patient have difficulty dressing or bathing?: Yes (Pt reports he is able to shower indepedently but uses a cane, or walker at home, potty chair in his bedroom. ) Independently performs ADLs?: Yes (appropriate for developmental age) (with assistance devices. ) Communication: Independent Dressing (OT): Independent Grooming: Independent with device (comment) Feeding: Independent Bathing: Independent with device (comment) Toileting: Independent with device (comment) In/Out Bed: Independent with device (comment) Walks in Home: Independent with device (comment) Does the patient have difficulty walking or climbing stairs?: Yes Weakness of Legs:  Both Weakness of Arms/Hands: Both  Home Assistive Devices/Equipment Home Assistive Devices/Equipment: Eyeglasses;Cane (specify quad or straight);Walker (specify type);Bedside commode/3-in-1    Abuse/Neglect Assessment (Assessment to be complete while patient is alone) Physical Abuse: Denies Verbal Abuse: Denies Sexual Abuse: Denies Exploitation of patient/patient's resources: Denies Self-Neglect: Denies Values / Beliefs Cultural Requests During Hospitalization: None Spiritual Requests During Hospitalization: None   Advance Directives (For Healthcare) Does patient have an advance directive?: No Would patient like information on creating an advanced directive?: No - patient declined information Nutrition Screen- [facility] Patient's home diet: Regular  Additional Information 1:1 In Past 12 Months?: No CIRT Risk: No Elopement Risk: No Does patient have medical clearance?: No (still getting fluids)     Disposition:  Pt meets inpt criteria due to active SI with planing and inability to contract for safety. Pt is currently receiving fluids, and will be transferred to psych ed. EDP will let TTS know when pt is medically cleared and placement can be sought.   Lear Ng, [facility] Triage Specialist 02/01/2014 4:32 AM  Disposition Initial Assessment Completed for this Encounter: Yes Disposition of Patient: Inpatient treatment program Type of inpatient treatment program: Adult  Rhona Raider 02/01/2014 4:17 AM

## 2014-02-01 NOTE — ED Notes (Signed)
Pt alert x4, v/s stable, calm and cooperate, Pt is here for SI with no plan to harm self or others. Will continue to monitor. Jeanie Sewer, RN 4:38 AM 02/01/2014

## 2014-02-01 NOTE — [facility] Suicide Risk Assessment (Signed)
Suicide Risk Assessment  Discharge Assessment     Demographic Factors:  Male  Total Time spent with patient: 20 minutes  Psychiatric Specialty Exam:     Blood pressure 117/65, pulse 67, temperature 97.7 F (36.5 C), temperature source Oral, resp. rate 16, height 5\' 10"  (1.778 m), weight 219 lb (99.338 kg), SpO2 100.00%.Body mass index is 31.42 kg/(m^2).  General Appearance: Casual  Eye Contact::  Good  Speech:  Normal Rate  Volume:  Normal  Mood:  Anxious  Affect:  Congruent  Thought Process:  Coherent  Orientation:  Full (Time, Place, and Person)  Thought Content:  WDL  Suicidal Thoughts:  No  Homicidal Thoughts:  No  Memory:  Immediate;   Good Recent;   Good Remote;   Good  Judgement:  Fair  Insight:  Fair  Psychomotor Activity:  Normal  Concentration:  Good  Recall:  Good  Fund of Knowledge:Good  Language: Good  Akathisia:  No  Handed:  Right  AIMS (if indicated):     Assets:  Communication Skills Desire for Improvement Financial Resources/Insurance Housing Leisure Time Resilience Social Support Transportation  Sleep:      Musculoskeletal: Strength & Muscle Tone: within normal limits Gait & Station: normal Patient leans: N/A  Mental Status Per Nursing Assessment::   On Admission:   alcohol abuse  Current Mental Status by Physician: NA  Loss Factors: NA  Historical Factors: NA  Risk Reduction Factors:   Sense of responsibility to family, Living with another person, especially a relative, Positive social support and Positive coping skills or problem solving skills  Continued Clinical Symptoms:  Anxiety   Cognitive Features That Contribute To Risk:   None  Suicide Risk:  Minimal: No identifiable suicidal ideation.  Patients presenting with no risk factors but with morbid ruminations; may be classified as minimal risk based on the severity of the depressive symptoms  Discharge Diagnoses:   AXIS I:  Alcohol Abuse and Anxiety Disorder  NOS AXIS II:  Deferred AXIS III:   Past Medical History  Diagnosis Date  . Neuropathy   . Diabetes mellitus   . Bipolar affect, depressed   . Hypertension   . Arthritis   . Stroke     Mini stroke about 52yrs ago  . Cirrhosis   . Alcohol abuse   . Chronic pain   . Cocaine abuse   . Muscle spasm     both legs  . Encephalopathy, hepatic   . Detached retina   . COPD (chronic obstructive pulmonary disease)     emphysema  . Bronchitis   . Barrett's esophagus   . GERD (gastroesophageal reflux disease)     has ulcer  . Anemia    AXIS IV:  other psychosocial or environmental problems and problems related to social environment AXIS V:  61-70 mild symptoms  Plan Of Care/Follow-up recommendations:  Activity:  as tolerated Diet:  low-sodium heart healthy diet  Is patient on multiple antipsychotic therapies at discharge:  No   Has Patient had three or more failed trials of antipsychotic monotherapy by history:  No  Recommended Plan for Multiple Antipsychotic Therapies: NA    Lakiyah Arntson, PMH-NP 02/01/2014, 3:57 PM

## 2014-02-01 NOTE — ED Notes (Signed)
Placed pt belongings in locker 28.

## 2014-02-01 NOTE — ED Notes (Signed)
Pt does not want his care discussed with his daughter [name].

## 2014-02-01 NOTE — ED Notes (Signed)
Pt discharged to home. Left unit in good condition ambulating to checkout accompanied by nurse tech to catch ride outside. [name] dtr called and said she will pick pt up therefore pt up in front awaiting ride. No concerns voiced. All pt's belongings given to pt. Vwilliams,rn.

## 2014-02-01 NOTE — ED Notes (Addendum)
Pt transported from home by EMS requesting to speak with someone regarding psychiatric needs. Pt stated he is not being cared for properly by his family.  Pt states he needs help with drinking, pt states his has increased stress d/t daughter living with him with 3 children and recently caught girlfriend cheating on him. Pt states he does not want to live if he does not get help today.

## 2014-02-01 NOTE — ED Provider Notes (Signed)
Medical screening examination/treatment/procedure(s) were performed by non-physician practitioner and as supervising physician I was immediately available for consultation/collaboration.   EKG Interpretation None       Kalman Drape, MD 02/01/14 [phone]

## 2014-02-01 NOTE — ED Provider Notes (Signed)
CSN: [id number]     Arrival date & time 01/31/14  2356 History   First MD Initiated Contact with Patient 02/01/14 0227     Chief Complaint  Patient presents with  . Medical Clearance    (Consider location/radiation/quality/duration/timing/severity/associated sxs/prior Treatment) HPI Comments: Patient with history of alcoholic liver cirrhosis, recent admission for elevated ammonia and altered mental status, diabetes, neuropathy, bipolar depression -- presents with complaints of worsening depression due to stressors at home from family and living situation stating "if I don't get help, that's it, going to kill myself". He used to drink alcohol but has not done so in 2 years. Patient currently takes Xanax and pain medication for his chronic problems. He denies any current acute medical complaints including fever, nausea, vomiting, diarrhea. He is on Lasix. The onset of this condition was acute. The course is constant. Aggravating factors: none. Alleviating factors: none. Of note, AKI was treated during last admission as well. Dose of diuretic decreased.    The history is provided by the patient and medical records.    Past Medical History  Diagnosis Date  . Neuropathy   . Diabetes mellitus   . Bipolar affect, depressed   . Hypertension   . Arthritis   . Stroke     Mini stroke about 47yrs ago  . Cirrhosis   . Alcohol abuse   . Chronic pain   . Cocaine abuse   . Muscle spasm     both legs  . Encephalopathy, hepatic   . Detached retina   . COPD (chronic obstructive pulmonary disease)     emphysema  . Bronchitis   . Barrett's esophagus   . GERD (gastroesophageal reflux disease)     has ulcer  . Anemia    Past Surgical History  Procedure Laterality Date  . Fracture surgery      Leg and arm 58yrs ago  . Esophagogastroduodenoscopy  04/04/2012    Procedure: ESOPHAGOGASTRODUODENOSCOPY (EGD);  Surgeon: Irene Shipper, MD;  Location: [facility] ENDOSCOPY;  Service: Endoscopy;  Laterality: N/A;   . Esophagogastroduodenoscopy Left 03/13/2013    Procedure: ESOPHAGOGASTRODUODENOSCOPY (EGD);  Surgeon: Arta Silence, MD;  Location: [facility] ENDOSCOPY;  Service: Endoscopy;  Laterality: Left;  Marland Kitchen Eye surgery  8 months ago both eyes    cataracts both eyes, detached eye, gas pocket  . Vasectomy    . Pars plana vitrectomy Left 07/08/2013    Procedure: PARS PLANA VITRECTOMY WITH 25 GAUGE;  Surgeon: Hurman Horn, MD;  Location: [place];  Service: Ophthalmology;  Laterality: Left;  . Intraocular lens removal Left 07/08/2013    Procedure: REMOVAL OF INTRAOCULAR LENS;  Surgeon: Hurman Horn, MD;  Location: [place];  Service: Ophthalmology;  Laterality: Left;  . Placement and suture of secondary intraocular lens Left 07/08/2013    Procedure: PLACEMENT AND SUTURE OF SECONDARY INTRAOCULAR LENS;  Surgeon: Hurman Horn, MD;  Location: [place];  Service: Ophthalmology;  Laterality: Left;  Insertion of Anterior Capsule Intraocular Lens   . Esophagogastroduodenoscopy N/A 01/16/2014    Procedure: ESOPHAGOGASTRODUODENOSCOPY (EGD);  Surgeon: Winfield Cunas., MD;  Location: [facility] ENDOSCOPY;  Service: Endoscopy;  Laterality: N/A;  . Colonoscopy N/A 01/17/2014    Procedure: COLONOSCOPY;  Surgeon: Winfield Cunas., MD;  Location: [facility] ENDOSCOPY;  Service: Endoscopy;  Laterality: N/A;  possible banding   Family History  Problem Relation Age of Onset  . Hypotension Mother    History  Substance Use Topics  . Smoking status: Current Every Day Smoker --  1.00 packs/day for 30 years    Types: Cigarettes    Last Attempt to Quit: 04/06/2012  . Smokeless tobacco: Never Used     Comment: quit   . Alcohol Use: 0.0 oz/week     Comment: 12 pk beer daily  06/2013 - no alcohol since 11/2012    Review of Systems  Constitutional: Negative for fever.  HENT: Negative for rhinorrhea and sore throat.   Eyes: Negative for redness.  Respiratory: Negative for cough.   Cardiovascular: Negative for chest pain.  Gastrointestinal: Negative  for nausea, vomiting, abdominal pain and diarrhea.  Genitourinary: Negative for dysuria.  Musculoskeletal: Negative for myalgias.  Skin: Negative for rash.  Neurological: Negative for headaches.  Psychiatric/Behavioral: Positive for suicidal ideas.    Allergies  Review of patient's allergies indicates no known allergies.  Home Medications   Prior to Admission medications   Medication Sig Start Date End Date Taking? Authorizing Provider  ALPRAZolam Duanne Moron) 0.5 MG tablet Take 2 tablets (1 mg total) by mouth at bedtime as needed for anxiety. 01/29/14  Yes Thurnell Lose, MD  amitriptyline (ELAVIL) 25 MG tablet Take 25 mg by mouth at bedtime.    Yes Historical Provider, MD  diclofenac sodium (VOLTAREN) 1 % GEL Apply 1 application topically 2 (two) times daily as needed (pain).    Yes Historical Provider, MD  furosemide (LASIX) 20 MG tablet Take 1 tablet (20 mg total) by mouth daily. 01/29/14  Yes Thurnell Lose, MD  insulin aspart (NOVOLOG FLEXPEN) 100 UNIT/ML FlexPen Inject 6 Units into the skin 3 (three) times daily with meals.   Yes Historical Provider, MD  Insulin Glargine (LANTUS SOLOSTAR) 100 UNIT/ML Solostar Pen Inject 45 Units into the skin at bedtime. 11/17/13  Yes Malvin Johns, MD  lactulose (CHRONULAC) 10 GM/15ML solution Take 45 mLs (30 g total) by mouth 3 (three) times daily. Titrate to at least 3 BMs daily 01/29/14  Yes Thurnell Lose, MD  Oxycodone HCl 10 MG TABS Take 10 mg by mouth 3 (three) times daily. scheduled   Yes Historical Provider, MD  pantoprazole (PROTONIX) 40 MG tablet Take 40 mg by mouth at bedtime.    Yes Historical Provider, MD  propranolol (INDERAL) 20 MG tablet Take 20 mg by mouth daily.    Yes Historical Provider, MD  rifaximin (XIFAXAN) 550 MG TABS tablet Take 1 tablet (550 mg total) by mouth 2 (two) times daily. 01/29/14  Yes Thurnell Lose, MD  spironolactone (ALDACTONE) 50 MG tablet Take 1 tablet (50 mg total) by mouth daily. 01/29/14  Yes Thurnell Lose,  MD   BP 143/56  Pulse 78  Temp(Src) 98.5 F (36.9 C) (Oral)  Resp 20  Ht 5\' 10"  (1.778 m)  Wt 219 lb (99.338 kg)  BMI 31.42 kg/m2  SpO2 99%  Physical Exam  Nursing note and vitals reviewed. Constitutional: He appears well-developed and well-nourished.  HENT:  Head: Normocephalic and atraumatic.  Eyes: Conjunctivae are normal. Right eye exhibits no discharge. Left eye exhibits no discharge.  Neck: Normal range of motion. Neck supple.  Cardiovascular: Normal rate, regular rhythm and normal heart sounds.   Pulmonary/Chest: Effort normal and breath sounds normal.  Abdominal: Soft. There is no tenderness.  Musculoskeletal: He exhibits edema (baseline LE edema). He exhibits no tenderness.  Neurological: He is alert.  No asterixis.   Skin: Skin is warm and dry.  Psychiatric: His speech is normal. Thought content is not paranoid and not delusional. He exhibits a depressed mood. He  expresses suicidal ideation. He expresses no homicidal ideation. He expresses no suicidal plans and no homicidal plans.  Appropriate speech. No apparent confusion.     ED Course  Procedures (including critical care time) Labs Review Labs Reviewed  CBC - Abnormal; Notable for the following:    RBC 3.14 (*)    Hemoglobin 9.8 (*)    HCT 29.7 (*)    RDW 17.2 (*)    Platelets 52 (*)    All other components within normal limits  COMPREHENSIVE METABOLIC PANEL - Abnormal; Notable for the following:    Glucose, Bld 147 (*)    Creatinine, Ser 1.72 (*)    Albumin 2.7 (*)    AST 49 (*)    Total Bilirubin 2.0 (*)    GFR calc non Af Amer 42 (*)    GFR calc Af Amer 49 (*)    All other components within normal limits  SALICYLATE LEVEL - Abnormal; Notable for the following:    Salicylate Lvl <3.2 (*)    All other components within normal limits  URINE RAPID DRUG SCREEN (HOSP PERFORMED) - Abnormal; Notable for the following:    Benzodiazepines POSITIVE (*)    All other components within normal limits  AMMONIA  - Abnormal; Notable for the following:    Ammonia 62 (*)    All other components within normal limits  CBG MONITORING, ED - Abnormal; Notable for the following:    Glucose-Capillary 110 (*)    All other components within normal limits  ACETAMINOPHEN LEVEL  ETHANOL    Imaging Review No results found.   EKG Interpretation None      2:49 AM Patient seen and examined. Work-up initiated. Holding orders completed.   Vital signs reviewed and are as follows: BP 143/56  Pulse 78  Temp(Src) 98.5 F (36.9 C) (Oral)  Resp 20  Ht 5\' 10"  (1.778 m)  Wt 219 lb (99.338 kg)  BMI 31.42 kg/m2  SpO2 99%  2:57 AM Fluids ordered due to elevated creatinine. Discussed with Dr. Sharol Given. Will start TTS pathway.   6:07 AM TTS has evaluated. Patient unable to contract for safety and he will need placement.   Patient hydrated and is medically cleared from our standpoint (plan reviewed with Dr. Sharol Given). He needs to continue home medications which are ordered. TTS aware that patient cleared.   Pending placement.   MDM   Final diagnoses:  Depression  Suicidal ideation  Renal insufficiency, mild   Mild AKI, addressed at last hospitalization, treated with hydration here.   Hepatic encephalopathy, on lactulose. No altered mental status today. Mildly high ammonia. No need for additional treatments. Pt needs to continue home medications.   Pending placement.     Carlisle Cater, PA-C 02/01/14 [phone]

## 2014-02-01 NOTE — BH Assessment (Signed)
Received call from Carlisle Cater, PA-C stating Pt is now medically cleared.  Orpah Greek Rosana Hoes, [facility] Triage Specialist [phone]

## 2014-02-02 ENCOUNTER — Emergency Department (HOSPITAL_COMMUNITY)
Admission: EM | Admit: 2014-02-02 | Discharge: 2014-02-03 | Disposition: A | Discharge status: Home / Self Care | Payer: Medicare PPO | Attending: Emergency Medicine | Admitting: Emergency Medicine

## 2014-02-02 ENCOUNTER — Emergency Department (HOSPITAL_COMMUNITY): Payer: Medicare PPO

## 2014-02-02 ENCOUNTER — Encounter (HOSPITAL_COMMUNITY): Payer: Self-pay | Admitting: Emergency Medicine

## 2014-02-02 DIAGNOSIS — E119 Type 2 diabetes mellitus without complications: Secondary | ICD-10-CM | POA: Diagnosis not present

## 2014-02-02 DIAGNOSIS — G8929 Other chronic pain: Secondary | ICD-10-CM | POA: Diagnosis not present

## 2014-02-02 DIAGNOSIS — M129 Arthropathy, unspecified: Secondary | ICD-10-CM | POA: Diagnosis not present

## 2014-02-02 DIAGNOSIS — F411 Generalized anxiety disorder: Secondary | ICD-10-CM | POA: Diagnosis present

## 2014-02-02 DIAGNOSIS — Z862 Personal history of diseases of the blood and blood-forming organs and certain disorders involving the immune mechanism: Secondary | ICD-10-CM | POA: Diagnosis not present

## 2014-02-02 DIAGNOSIS — F419 Anxiety disorder, unspecified: Secondary | ICD-10-CM

## 2014-02-02 DIAGNOSIS — I10 Essential (primary) hypertension: Secondary | ICD-10-CM | POA: Insufficient documentation

## 2014-02-02 DIAGNOSIS — K219 Gastro-esophageal reflux disease without esophagitis: Secondary | ICD-10-CM | POA: Diagnosis not present

## 2014-02-02 DIAGNOSIS — F32A Depression, unspecified: Secondary | ICD-10-CM

## 2014-02-02 DIAGNOSIS — F17200 Nicotine dependence, unspecified, uncomplicated: Secondary | ICD-10-CM | POA: Insufficient documentation

## 2014-02-02 DIAGNOSIS — Z8673 Personal history of transient ischemic attack (TIA), and cerebral infarction without residual deficits: Secondary | ICD-10-CM | POA: Insufficient documentation

## 2014-02-02 DIAGNOSIS — G589 Mononeuropathy, unspecified: Secondary | ICD-10-CM | POA: Diagnosis not present

## 2014-02-02 DIAGNOSIS — Z79899 Other long term (current) drug therapy: Secondary | ICD-10-CM | POA: Diagnosis not present

## 2014-02-02 DIAGNOSIS — F3130 Bipolar disorder, current episode depressed, mild or moderate severity, unspecified: Secondary | ICD-10-CM | POA: Diagnosis not present

## 2014-02-02 DIAGNOSIS — Z794 Long term (current) use of insulin: Secondary | ICD-10-CM | POA: Diagnosis not present

## 2014-02-02 DIAGNOSIS — F329 Major depressive disorder, single episode, unspecified: Secondary | ICD-10-CM

## 2014-02-02 LAB — RAPID URINE DRUG SCREEN, HOSP PERFORMED
Amphetamines: NOT DETECTED
BARBITURATES: NOT DETECTED
BENZODIAZEPINES: POSITIVE — AB
Cocaine: NOT DETECTED
Opiates: NOT DETECTED
TETRAHYDROCANNABINOL: NOT DETECTED

## 2014-02-02 LAB — COMPREHENSIVE METABOLIC PANEL
ALK PHOS: 91 U/L (ref 39–117)
ALT: 22 U/L (ref 0–53)
AST: 54 U/L — AB (ref 0–37)
Albumin: 2.8 g/dL — ABNORMAL LOW (ref 3.5–5.2)
Anion gap: 13 (ref 5–15)
BILIRUBIN TOTAL: 1.8 mg/dL — AB (ref 0.3–1.2)
BUN: 16 mg/dL (ref 6–23)
CHLORIDE: 105 meq/L (ref 96–112)
CO2: 20 meq/L (ref 19–32)
Calcium: 8.7 mg/dL (ref 8.4–10.5)
Creatinine, Ser: 1.42 mg/dL — ABNORMAL HIGH (ref 0.50–1.35)
GFR calc Af Amer: 61 mL/min — ABNORMAL LOW (ref >90)
GFR, EST NON AFRICAN AMERICAN: 53 mL/min — AB (ref >90)
Glucose, Bld: 159 mg/dL — ABNORMAL HIGH (ref 70–99)
Potassium: 4.5 mEq/L (ref 3.7–5.3)
Sodium: 138 mEq/L (ref 137–147)
Total Protein: 6.3 g/dL (ref 6.0–8.3)

## 2014-02-02 LAB — CBC
HEMATOCRIT: 30.2 % — AB (ref 39.0–52.0)
Hemoglobin: 10.3 g/dL — ABNORMAL LOW (ref 13.0–17.0)
MCH: 31.5 pg (ref 26.0–34.0)
MCHC: 34.1 g/dL (ref 30.0–36.0)
MCV: 92.4 fL (ref 78.0–100.0)
PLATELETS: 48 10*3/uL — AB (ref 150–400)
RBC: 3.27 MIL/uL — AB (ref 4.22–5.81)
RDW: 17.2 % — ABNORMAL HIGH (ref 11.5–15.5)
WBC: 3.6 10*3/uL — AB (ref 4.0–10.5)

## 2014-02-02 LAB — ETHANOL

## 2014-02-02 LAB — ACETAMINOPHEN LEVEL: Acetaminophen (Tylenol), Serum: <15 ug/mL (ref 10–30)

## 2014-02-02 LAB — SALICYLATE LEVEL: Salicylate Lvl: <2 mg/dL — ABNORMAL LOW (ref 2.8–20.0)

## 2014-02-02 NOTE — ED Notes (Addendum)
Pt states that he is feeling anxious and depressed and that he "needs help." Pt was seen here last night for the same. Pt denies and SI/HI. Pt also c/o Left shoulder pain and has limited ROM.

## 2014-02-02 NOTE — ED Notes (Signed)
I attempt to collect patient blood and was unsuccessful

## 2014-02-03 NOTE — BH Assessment (Signed)
Tele Assessment Note   [name] is an 58 y.o. male pt is alert and oriented times 4, mood is pleasant but mildly anxious, affect congruent. Speech is logical and coherent. Pt is known to this Probation officer from recent assessment 01-31-14. Today pt is more alert, mood lifted, well-groomed and general appearance and presentation much more positive than several days ago. Pt reports he returned to ED because he was feeling overwhelmed after babysitting his grandchildren today. He has been out of his medication for 2 days and initially thought he could not wait for his scheduled appointment with PCP 02-03-14 at 11:30 am. Pt now reports he has calmed down and feels he can wait until his appointment. Pt reports he would like to go home to sleep, would like his medication, but is able to wait until tomorrow if necessary.   Pt denies SI/HI, denies self-harm, denies a/v hallucinations and has been sober for more than a year.    Axis I: 300.00 Unspecified Anxiety Disorder, with panic attacks            296.53 Bipolar I Disorder most recent episode depressed  303.90 Alcohol Use Disorder severe, in sustained remission  304.20 Cocaine use disorder, moderate, in sustained remission Axis II: Deferred Axis III:  Past Medical History  Diagnosis Date  . Neuropathy   . Diabetes mellitus   . Bipolar affect, depressed   . Hypertension   . Arthritis   . Stroke     Mini stroke about 71yrs ago  . Cirrhosis   . Alcohol abuse   . Chronic pain   . Cocaine abuse   . Muscle spasm     both legs  . Encephalopathy, hepatic   . Detached retina   . COPD (chronic obstructive pulmonary disease)     emphysema  . Bronchitis   . Barrett's esophagus   . GERD (gastroesophageal reflux disease)     has ulcer  . Anemia    Axis IV: other psychosocial or environmental problems, problems with access to health care services and problems with primary support group Axis V: 51-60 moderate symptoms  Past Medical History:  Past  Medical History  Diagnosis Date  . Neuropathy   . Diabetes mellitus   . Bipolar affect, depressed   . Hypertension   . Arthritis   . Stroke     Mini stroke about 43yrs ago  . Cirrhosis   . Alcohol abuse   . Chronic pain   . Cocaine abuse   . Muscle spasm     both legs  . Encephalopathy, hepatic   . Detached retina   . COPD (chronic obstructive pulmonary disease)     emphysema  . Bronchitis   . Barrett's esophagus   . GERD (gastroesophageal reflux disease)     has ulcer  . Anemia     Past Surgical History  Procedure Laterality Date  . Fracture surgery      Leg and arm 56yrs ago  . Esophagogastroduodenoscopy  04/04/2012    Procedure: ESOPHAGOGASTRODUODENOSCOPY (EGD);  Surgeon: Irene Shipper, MD;  Location: [facility] ENDOSCOPY;  Service: Endoscopy;  Laterality: N/A;  . Esophagogastroduodenoscopy Left 03/13/2013    Procedure: ESOPHAGOGASTRODUODENOSCOPY (EGD);  Surgeon: Arta Silence, MD;  Location: [facility] ENDOSCOPY;  Service: Endoscopy;  Laterality: Left;  Marland Kitchen Eye surgery  8 months ago both eyes    cataracts both eyes, detached eye, gas pocket  . Vasectomy    . Pars plana vitrectomy Left 07/08/2013    Procedure: PARS PLANA  VITRECTOMY WITH 25 GAUGE;  Surgeon: Hurman Horn, MD;  Location: [place];  Service: Ophthalmology;  Laterality: Left;  . Intraocular lens removal Left 07/08/2013    Procedure: REMOVAL OF INTRAOCULAR LENS;  Surgeon: Hurman Horn, MD;  Location: [place];  Service: Ophthalmology;  Laterality: Left;  . Placement and suture of secondary intraocular lens Left 07/08/2013    Procedure: PLACEMENT AND SUTURE OF SECONDARY INTRAOCULAR LENS;  Surgeon: Hurman Horn, MD;  Location: [place];  Service: Ophthalmology;  Laterality: Left;  Insertion of Anterior Capsule Intraocular Lens   . Esophagogastroduodenoscopy N/A 01/16/2014    Procedure: ESOPHAGOGASTRODUODENOSCOPY (EGD);  Surgeon: Winfield Cunas., MD;  Location: [facility] ENDOSCOPY;  Service: Endoscopy;  Laterality: N/A;  . Colonoscopy N/A  01/17/2014    Procedure: COLONOSCOPY;  Surgeon: Winfield Cunas., MD;  Location: [facility] ENDOSCOPY;  Service: Endoscopy;  Laterality: N/A;  possible banding    Family History:  Family History  Problem Relation Age of Onset  . Hypotension Mother     Social History:  reports that he has been smoking Cigarettes.  He has a 30 pack-year smoking history. He has never used smokeless tobacco. He reports that he drinks alcohol. He reports that he uses illicit drugs (Cocaine).  Additional Social History:  Alcohol / Drug Use Pain Medications: SEE MAR Prescriptions: SEE MAR, reports out of medication for 2 days Over the Counter: SEE MAR History of alcohol / drug use?: Yes Longest period of sobriety (when/how long): 1 year Negative Consequences of Use: Personal relationships;Financial;Work / School Withdrawal Symptoms:  (none currently) Substance #1 Name of Substance 1: alcohol  1 - Last Use / Amount: pt reports he has not used for 2.5 years when he was discharged from [facility] and got an [place] sponsor. Pt was actually discharged about 1 year ago Substance #2 Name of Substance 2: cocaine 2 - Age of First Use: pt did not wish to talk about past drug use, "I used to use it to be up all night" 2 - Duration: pt reports he has not used for 2.5 years when he was discharged from [facility] and got an [place] sponsor. Pt was actually discharged about 1 year ago 2 - Last Use / Amount: about  1 year ago  CIWA: CIWA-Ar BP: 137/66 mmHg Pulse Rate: 59 COWS:    PATIENT STRENGTHS: (choose at least two) Loving relationship with daughter and 3 grandkids Has established OP services  Allergies: No Known Allergies  Home Medications:  (Not in a hospital admission)  OB/GYN Status:  No LMP for male patient.  General Assessment Data Location of Assessment: [facility] ED Is this a Tele or Face-to-Face Assessment?: Face-to-Face Is this an Initial Assessment or a Re-assessment for this encounter?: Initial Assessment Living Arrangements:  Children;Other relatives (dtr, three grandkids) Can pt return to current living arrangement?: Yes Admission Status: Voluntary Is patient capable of signing voluntary admission?: Yes Transfer from: Home Referral Source: Self/Family/Friend     [place] Living Arrangements: Children;Other relatives (dtr, three grandkids) Name of Psychiatrist: none PCP Dr. Jonelle Sidle prescribes medications Name of Therapist: none  Education Status Is patient currently in school?: No Current Grade: na Highest grade of school patient has completed: 12 Name of school: na Contact person: na  Risk to self with the past 6 months Suicidal Ideation: No-Not Currently/Within Last 6 Months Suicidal Intent: No Is patient at risk for suicide?: No Suicidal Plan?: No Specify Current Suicidal Plan: none Access to Means: No Specify Access  to Suicidal Means: none What has been your use of drugs/alcohol within the last 12 months?: Pt reports sobriety for 1 year following [facility] discharge. Pt used to abuse etoh and cocaine Previous Attempts/Gestures: No How many times?: 0 Other Self Harm Risks: none Triggers for Past Attempts: None known Intentional Self Injurious Behavior: None Family Suicide History: No Recent stressful life event(s):  (relationship problems, pain, dtr moved in with grandkids) Persecutory voices/beliefs?: No Depression: Yes Depression Symptoms: Insomnia;Loss of interest in usual pleasures Substance abuse history and/or treatment for substance abuse?: Yes Suicide prevention information given to non-admitted patients: Yes  Risk to Others within the past 6 months Homicidal Ideation: No Thoughts of Harm to Others: No Current Homicidal Intent: No Current Homicidal Plan: No Access to Homicidal Means: No Identified Victim: none History of harm to others?: No Assessment of Violence: None Noted Violent Behavior Description: none Does patient have access to weapons?: No Criminal Charges  Pending?: No Does patient have a court date: No  Psychosis Hallucinations: None noted Delusions: None noted  Mental Status Report Appear/Hygiene:  (clear shaven, hair combed) Eye Contact: Good Motor Activity: Unremarkable Speech: Logical/coherent Level of Consciousness: Alert Mood: Anxious;Depressed (mildly) Affect: Appropriate to circumstance Anxiety Level: Panic Attacks Panic attack frequency: weekly Most recent panic attack: 5 days ago Thought Processes: Coherent;Relevant Judgement: Partial Orientation: Person;Place;Time;Situation Obsessive Compulsive Thoughts/Behaviors: None  Cognitive Functioning Concentration: Normal Memory: Recent Intact;Remote Intact IQ: Average Insight: Fair Impulse Control: Fair Appetite: Good Weight Loss: 0 Weight Gain: 0 Sleep: Decreased Total Hours of Sleep: 0 (2 days out of medication) Vegetative Symptoms: None  ADLScreening [facility] Assessment Services) Patient's cognitive ability adequate to safely complete daily activities?: Yes Patient able to express need for assistance with ADLs?: Yes Independently performs ADLs?: Yes (appropriate for developmental age)  Prior Inpatient Therapy Prior Inpatient Therapy: Yes Prior Therapy Dates: [facility] 2014, [place] about 4 years ago Prior Therapy Facilty/Provider(s): [facility] [place] Reason for Treatment: SA  Prior Outpatient Therapy Prior Outpatient Therapy: No Prior Therapy Dates: none Prior Therapy Facilty/Provider(s): none Reason for Treatment: none  ADL Screening (condition at time of admission) Patient's cognitive ability adequate to safely complete daily activities?: Yes Is the patient deaf or have difficulty hearing?: Yes Does the patient have difficulty seeing, even when wearing glasses/contacts?: Yes Does the patient have difficulty concentrating, remembering, or making decisions?: No Patient able to express need for assistance with ADLs?: Yes Does the patient have difficulty dressing or bathing?:  Yes Independently performs ADLs?: Yes (appropriate for developmental age)  Home Assistive Devices/Equipment Home Assistive Devices/Equipment: Eyeglasses;Cane (specify quad or straight);Walker (specify type);Bedside commode/3-in-1    Abuse/Neglect Assessment (Assessment to be complete while patient is alone) Physical Abuse: Denies Verbal Abuse: Denies Sexual Abuse: Denies Exploitation of patient/patient's resources: Denies Self-Neglect: Denies Values / Beliefs Cultural Requests During Hospitalization: None Spiritual Requests During Hospitalization: None   Advance Directives (For Healthcare) Does patient have an advance directive?: No Would patient like information on creating an advanced directive?: No - patient declined information Nutrition Screen- [facility] Adult/[facility] Patient's home diet: Regular  Additional Information 1:1 In Past 12 Months?: No CIRT Risk: No Elopement Risk: No Does patient have medical clearance?: Yes     Disposition:  Discharge home to OP appointment later in AM per EDP, Quincy Carnes PA-C. Pt does not meet inpt criteria and exhibits very mild anxiety this time.   Lear Ng, [facility] Triage Specialist 02/03/2014 1:30 AM  Disposition Initial Assessment Completed for this Encounter: Yes Disposition of Patient: Other dispositions Other disposition(s):  Other (Comment) (back to OP provider)  Renly Guedes M 02/03/2014 1:22 AM

## 2014-02-03 NOTE — BH Assessment (Signed)
Per EDP, Quincy Carnes, PA-C pt to be discharged home to OP providers. Appointment scheduled for 02-03-14 at 11:30 pm.   Lear Ng, [facility] Triage Specialist 02/03/2014 1:12 AM

## 2014-02-03 NOTE — ED Provider Notes (Signed)
CSN: [id number]     Arrival date & time 02/02/14  2040 History   First MD Initiated Contact with Patient 02/02/14 2339     Chief Complaint  Patient presents with  . Anxiety  . Depression     (Consider location/radiation/quality/duration/timing/severity/associated sxs/prior Treatment) The history is provided by the patient and medical records.   This is a 58 y.o. F with past medical history significant for hypertension, diabetes, bipolar disorder, cirrhosis, chronic pain, COPD, GERD, presenting to the ED for anxiety and depression. Patient was seen in the ED 2 days ago for the same. He states he does not feel that his symptoms are currently well controlled. He notes increased stress from general light situations, but does not elaborate further. He denies any recent alcohol or illicit drug use. He denies any suicidal or ideation. He denies any auditory or visual hallucinations. He has a appointment with his primary care physician tomorrow, and hopefully to see a psychiatrist later this week, but states he cannot wait for this appointment. He states he has not slept or had an appetite in the past 2 days. Does note some left shoulder pain which is chronic for him.  No new injuries, trauma, or falls. Denies numbness, weakness, or paresthesias of left arm.  He denies any chest pain, shortness of breath, abdominal pain, nausea, vomiting, or diarrhea. No fever chills.  Past Medical History  Diagnosis Date  . Neuropathy   . Diabetes mellitus   . Bipolar affect, depressed   . Hypertension   . Arthritis   . Stroke     Mini stroke about 62yrs ago  . Cirrhosis   . Alcohol abuse   . Chronic pain   . Cocaine abuse   . Muscle spasm     both legs  . Encephalopathy, hepatic   . Detached retina   . COPD (chronic obstructive pulmonary disease)     emphysema  . Bronchitis   . Barrett's esophagus   . GERD (gastroesophageal reflux disease)     has ulcer  . Anemia    Past Surgical History  Procedure  Laterality Date  . Fracture surgery      Leg and arm 75yrs ago  . Esophagogastroduodenoscopy  04/04/2012    Procedure: ESOPHAGOGASTRODUODENOSCOPY (EGD);  Surgeon: Irene Shipper, MD;  Location: [facility] ENDOSCOPY;  Service: Endoscopy;  Laterality: N/A;  . Esophagogastroduodenoscopy Left 03/13/2013    Procedure: ESOPHAGOGASTRODUODENOSCOPY (EGD);  Surgeon: Arta Silence, MD;  Location: [facility] ENDOSCOPY;  Service: Endoscopy;  Laterality: Left;  Marland Kitchen Eye surgery  8 months ago both eyes    cataracts both eyes, detached eye, gas pocket  . Vasectomy    . Pars plana vitrectomy Left 07/08/2013    Procedure: PARS PLANA VITRECTOMY WITH 25 GAUGE;  Surgeon: Hurman Horn, MD;  Location: [place];  Service: Ophthalmology;  Laterality: Left;  . Intraocular lens removal Left 07/08/2013    Procedure: REMOVAL OF INTRAOCULAR LENS;  Surgeon: Hurman Horn, MD;  Location: [place];  Service: Ophthalmology;  Laterality: Left;  . Placement and suture of secondary intraocular lens Left 07/08/2013    Procedure: PLACEMENT AND SUTURE OF SECONDARY INTRAOCULAR LENS;  Surgeon: Hurman Horn, MD;  Location: [place];  Service: Ophthalmology;  Laterality: Left;  Insertion of Anterior Capsule Intraocular Lens   . Esophagogastroduodenoscopy N/A 01/16/2014    Procedure: ESOPHAGOGASTRODUODENOSCOPY (EGD);  Surgeon: Winfield Cunas., MD;  Location: [facility] ENDOSCOPY;  Service: Endoscopy;  Laterality: N/A;  . Colonoscopy N/A 01/17/2014  Procedure: COLONOSCOPY;  Surgeon: Winfield Cunas., MD;  Location: [facility] ENDOSCOPY;  Service: Endoscopy;  Laterality: N/A;  possible banding   Family History  Problem Relation Age of Onset  . Hypotension Mother    History  Substance Use Topics  . Smoking status: Current Every Day Smoker -- 1.00 packs/day for 30 years    Types: Cigarettes    Last Attempt to Quit: 04/06/2012  . Smokeless tobacco: Never Used     Comment: quit   . Alcohol Use: 0.0 oz/week     Comment: 12 pk beer daily  06/2013 - no alcohol since 11/2012     Review of Systems  Psychiatric/Behavioral: The patient is nervous/anxious.   All other systems reviewed and are negative.     Allergies  Review of patient's allergies indicates no known allergies.  Home Medications   Prior to Admission medications   Medication Sig Start Date End Date Taking? Authorizing Provider  ALPRAZolam Duanne Moron) 0.5 MG tablet Take 2 tablets (1 mg total) by mouth at bedtime as needed for anxiety. 01/29/14  Yes Thurnell Lose, MD  amitriptyline (ELAVIL) 25 MG tablet Take 25 mg by mouth at bedtime as needed for sleep.    Yes Historical Provider, MD  diclofenac sodium (VOLTAREN) 1 % GEL Apply 1 application topically 2 (two) times daily as needed (pain).    Yes Historical Provider, MD  furosemide (LASIX) 20 MG tablet Take 1 tablet (20 mg total) by mouth daily. 01/29/14  Yes Thurnell Lose, MD  ibuprofen (ADVIL,MOTRIN) 200 MG tablet Take 200 mg by mouth every 6 (six) hours as needed for moderate pain.   Yes Historical Provider, MD  insulin aspart (NOVOLOG FLEXPEN) 100 UNIT/ML FlexPen Inject 6 Units into the skin 3 (three) times daily with meals.   Yes Historical Provider, MD  Insulin Glargine (LANTUS SOLOSTAR) 100 UNIT/ML Solostar Pen Inject 45 Units into the skin at bedtime. 11/17/13  Yes Malvin Johns, MD  lactulose (CHRONULAC) 10 GM/15ML solution Take 45 mLs (30 g total) by mouth 3 (three) times daily. Titrate to at least 3 BMs daily 01/29/14  Yes Thurnell Lose, MD  Oxycodone HCl 10 MG TABS Take 10 mg by mouth 3 (three) times daily. scheduled   Yes Historical Provider, MD  pantoprazole (PROTONIX) 40 MG tablet Take 40 mg by mouth at bedtime.    Yes Historical Provider, MD  propranolol (INDERAL) 20 MG tablet Take 20 mg by mouth daily.    Yes Historical Provider, MD  rifaximin (XIFAXAN) 550 MG TABS tablet Take 1 tablet (550 mg total) by mouth 2 (two) times daily. 01/29/14  Yes Thurnell Lose, MD  spironolactone (ALDACTONE) 50 MG tablet Take 1 tablet (50 mg total) by  mouth daily. 01/29/14  Yes Thurnell Lose, MD   BP 124/59  Pulse 59  Temp(Src) 98.1 F (36.7 C)  Resp 17  Ht 5\' 10"  (1.778 m)  Wt 219 lb (99.338 kg)  BMI 31.42 kg/m2  SpO2 100%  Physical Exam  Nursing note and vitals reviewed. Constitutional: He is oriented to person, place, and time. He appears well-developed and well-nourished. No distress.  HENT:  Head: Normocephalic and atraumatic.  Mouth/Throat: Oropharynx is clear and moist.  Eyes: Conjunctivae and EOM are normal. Pupils are equal, round, and reactive to light.  Neck: Normal range of motion. Neck supple.  Cardiovascular: Normal rate, regular rhythm and normal heart sounds.   Pulmonary/Chest: Effort normal and breath sounds normal. No respiratory distress. He has no wheezes.  Abdominal: Soft. Bowel sounds are normal. There is no tenderness. There is no guarding.  Musculoskeletal:       Left shoulder: He exhibits decreased range of motion and pain.  Left shoulder with decreased ROM secondary to pain; no bony deformities or signs of dislocation; normal grip strength; arm remains NVI  Neurological: He is alert and oriented to person, place, and time.  Skin: Skin is warm and dry. He is not diaphoretic.  Psychiatric: His mood appears anxious. He is not actively hallucinating. He expresses no homicidal and no suicidal ideation. He expresses no suicidal plans and no homicidal plans.  Appears anxious and jittery on exam    ED Course  Procedures (including critical care time) Labs Review Labs Reviewed  CBC - Abnormal; Notable for the following:    WBC 3.6 (*)    RBC 3.27 (*)    Hemoglobin 10.3 (*)    HCT 30.2 (*)    RDW 17.2 (*)    Platelets 48 (*)    All other components within normal limits  COMPREHENSIVE METABOLIC PANEL - Abnormal; Notable for the following:    Glucose, Bld 159 (*)    Creatinine, Ser 1.42 (*)    Albumin 2.8 (*)    AST 54 (*)    Total Bilirubin 1.8 (*)    GFR calc non Af Amer 53 (*)    GFR calc Af  Amer 61 (*)    All other components within normal limits  SALICYLATE LEVEL - Abnormal; Notable for the following:    Salicylate Lvl <6.4 (*)    All other components within normal limits  URINE RAPID DRUG SCREEN (HOSP PERFORMED) - Abnormal; Notable for the following:    Benzodiazepines POSITIVE (*)    All other components within normal limits  ACETAMINOPHEN LEVEL  ETHANOL    Imaging Review Dg Shoulder Left  02/02/2014   CLINICAL DATA:  Left shoulder pain.  EXAM: LEFT SHOULDER - 2+ VIEW  COMPARISON:  08/09/2013  FINDINGS: There is no evidence of fracture or dislocation. There is no evidence of arthropathy or other focal bone abnormality. Soft tissues are unremarkable.  IMPRESSION: No significant abnormality.   Electronically Signed   By: Rozetta Nunnery M.D.   On: 02/02/2014 22:05     EKG Interpretation None      MDM   Final diagnoses:  Anxiety  Depression   58 year old male with anxiety and depression. Seen and evaluated in ED 2 days ago for the same and was discharged home with outpatient psychiatric resources. He has a followup appointment with his primary care physician tomorrow, but states he feels he cannot wait that long. He denies any real change in his symptoms, but states he feels increasingly anxious. He continues to deny any auditory or visual hallucinations. Denies any suicidal or homicidal ideation.  He does note some left shoulder pain which is chronic, imaging negative today.  No chest pain, SOB, palpitations, dizziness, weakness. Lab work was obtained which appears baseline for patient when compared with previous.  Do not believe patient will meet IP criteria given that there is no new change in his sx and he does not appear to be a threat to himself or other at this time, however patient is requesting to speak to ACT team member.  Patient medically cleared and awaiting their evaluation.   TTS has evaluated patient, cleared for discharge with PCP FU tomorrow.  Discussed plan  with patient, he/she acknowledged understanding and agreed with plan of care.  Return  precautions given for new or worsening symptoms.  Larene Pickett, PA-C 02/03/14 0210

## 2014-02-03 NOTE — ED Notes (Signed)
[name] daughter states that she will come to pick him up.   [name] [phone]

## 2014-02-03 NOTE — Discharge Instructions (Signed)
Continue your regular meds. Follow up with Dr. Jonelle Sidle tomorrow. Return to the ED for new concerns.

## 2014-02-03 NOTE — ED Provider Notes (Signed)
Medical screening examination/treatment/procedure(s) were performed by non-physician practitioner and as supervising physician I was immediately available for consultation/collaboration.   EKG Interpretation None        [name], MD 02/03/14 1630

## 2014-02-04 ENCOUNTER — Encounter (HOSPITAL_COMMUNITY): Payer: Self-pay | Admitting: Emergency Medicine

## 2014-02-04 ENCOUNTER — Emergency Department (HOSPITAL_COMMUNITY)
Admission: EM | Admit: 2014-02-04 | Discharge: 2014-02-05 | Disposition: A | Discharge status: Other Acute Inpatient Hospital | Payer: Medicare PPO | Attending: Emergency Medicine | Admitting: Emergency Medicine

## 2014-02-04 ENCOUNTER — Emergency Department (HOSPITAL_COMMUNITY): Payer: Medicare PPO

## 2014-02-04 DIAGNOSIS — Z79899 Other long term (current) drug therapy: Secondary | ICD-10-CM | POA: Diagnosis not present

## 2014-02-04 DIAGNOSIS — G8929 Other chronic pain: Secondary | ICD-10-CM | POA: Diagnosis not present

## 2014-02-04 DIAGNOSIS — I10 Essential (primary) hypertension: Secondary | ICD-10-CM | POA: Insufficient documentation

## 2014-02-04 DIAGNOSIS — F329 Major depressive disorder, single episode, unspecified: Secondary | ICD-10-CM | POA: Insufficient documentation

## 2014-02-04 DIAGNOSIS — F3289 Other specified depressive episodes: Secondary | ICD-10-CM | POA: Diagnosis present

## 2014-02-04 DIAGNOSIS — G4700 Insomnia, unspecified: Secondary | ICD-10-CM | POA: Diagnosis not present

## 2014-02-04 DIAGNOSIS — R609 Edema, unspecified: Secondary | ICD-10-CM | POA: Diagnosis not present

## 2014-02-04 DIAGNOSIS — F17200 Nicotine dependence, unspecified, uncomplicated: Secondary | ICD-10-CM | POA: Insufficient documentation

## 2014-02-04 DIAGNOSIS — Z792 Long term (current) use of antibiotics: Secondary | ICD-10-CM | POA: Diagnosis not present

## 2014-02-04 DIAGNOSIS — M129 Arthropathy, unspecified: Secondary | ICD-10-CM | POA: Insufficient documentation

## 2014-02-04 DIAGNOSIS — F1310 Sedative, hypnotic or anxiolytic abuse, uncomplicated: Secondary | ICD-10-CM | POA: Insufficient documentation

## 2014-02-04 DIAGNOSIS — K7290 Hepatic failure, unspecified without coma: Secondary | ICD-10-CM

## 2014-02-04 DIAGNOSIS — Z862 Personal history of diseases of the blood and blood-forming organs and certain disorders involving the immune mechanism: Secondary | ICD-10-CM | POA: Diagnosis not present

## 2014-02-04 DIAGNOSIS — E139 Other specified diabetes mellitus without complications: Secondary | ICD-10-CM

## 2014-02-04 DIAGNOSIS — K7682 Hepatic encephalopathy: Secondary | ICD-10-CM | POA: Insufficient documentation

## 2014-02-04 DIAGNOSIS — R45851 Suicidal ideations: Secondary | ICD-10-CM | POA: Diagnosis not present

## 2014-02-04 DIAGNOSIS — K2270 Barrett's esophagus without dysplasia: Secondary | ICD-10-CM | POA: Diagnosis not present

## 2014-02-04 DIAGNOSIS — Z794 Long term (current) use of insulin: Secondary | ICD-10-CM | POA: Diagnosis not present

## 2014-02-04 DIAGNOSIS — J438 Other emphysema: Secondary | ICD-10-CM | POA: Diagnosis not present

## 2014-02-04 DIAGNOSIS — K219 Gastro-esophageal reflux disease without esophagitis: Secondary | ICD-10-CM | POA: Diagnosis not present

## 2014-02-04 DIAGNOSIS — E119 Type 2 diabetes mellitus without complications: Secondary | ICD-10-CM | POA: Insufficient documentation

## 2014-02-04 DIAGNOSIS — Z8673 Personal history of transient ischemic attack (TIA), and cerebral infarction without residual deficits: Secondary | ICD-10-CM | POA: Insufficient documentation

## 2014-02-04 DIAGNOSIS — F314 Bipolar disorder, current episode depressed, severe, without psychotic features: Secondary | ICD-10-CM | POA: Insufficient documentation

## 2014-02-04 DIAGNOSIS — M7989 Other specified soft tissue disorders: Secondary | ICD-10-CM

## 2014-02-04 DIAGNOSIS — F331 Major depressive disorder, recurrent, moderate: Secondary | ICD-10-CM

## 2014-02-04 LAB — CBC
HEMATOCRIT: 26.9 % — AB (ref 39.0–52.0)
Hemoglobin: 8.9 g/dL — ABNORMAL LOW (ref 13.0–17.0)
MCH: 30.8 pg (ref 26.0–34.0)
MCHC: 33.1 g/dL (ref 30.0–36.0)
MCV: 93.1 fL (ref 78.0–100.0)
PLATELETS: 44 10*3/uL — AB (ref 150–400)
RBC: 2.89 MIL/uL — AB (ref 4.22–5.81)
RDW: 17.6 % — ABNORMAL HIGH (ref 11.5–15.5)
WBC: 3.1 10*3/uL — ABNORMAL LOW (ref 4.0–10.5)

## 2014-02-04 LAB — COMPREHENSIVE METABOLIC PANEL
ALT: 20 U/L (ref 0–53)
AST: 48 U/L — AB (ref 0–37)
Albumin: 2.6 g/dL — ABNORMAL LOW (ref 3.5–5.2)
Alkaline Phosphatase: 82 U/L (ref 39–117)
Anion gap: 11 (ref 5–15)
BUN: 16 mg/dL (ref 6–23)
CALCIUM: 8.7 mg/dL (ref 8.4–10.5)
CO2: 24 meq/L (ref 19–32)
CREATININE: 1.23 mg/dL (ref 0.50–1.35)
Chloride: 106 mEq/L (ref 96–112)
GFR calc non Af Amer: 63 mL/min — ABNORMAL LOW (ref >90)
GFR, EST AFRICAN AMERICAN: 73 mL/min — AB (ref >90)
Glucose, Bld: 157 mg/dL — ABNORMAL HIGH (ref 70–99)
Potassium: 3.8 mEq/L (ref 3.7–5.3)
Sodium: 141 mEq/L (ref 137–147)
Total Bilirubin: 2.2 mg/dL — ABNORMAL HIGH (ref 0.3–1.2)
Total Protein: 5.8 g/dL — ABNORMAL LOW (ref 6.0–8.3)

## 2014-02-04 LAB — ETHANOL: Alcohol, Ethyl (B): <11 mg/dL (ref 0–11)

## 2014-02-04 LAB — SALICYLATE LEVEL

## 2014-02-04 LAB — ACETAMINOPHEN LEVEL: Acetaminophen (Tylenol), Serum: <15 ug/mL (ref 10–30)

## 2014-02-04 MED ORDER — AMITRIPTYLINE HCL 25 MG PO TABS
25.0000 mg | ORAL_TABLET | Freq: Every evening | ORAL | Status: DC | PRN
  Administered 2014-02-05: 25 mg via ORAL
  Filled 2014-02-04: qty 1

## 2014-02-04 MED ORDER — ALPRAZOLAM 1 MG PO TABS: 1.0000 mg | ORAL_TABLET | Freq: Every evening | ORAL | Status: DC | PRN

## 2014-02-04 MED ORDER — PANTOPRAZOLE SODIUM 40 MG PO TBEC
40.0000 mg | DELAYED_RELEASE_TABLET | Freq: Every day | ORAL | Status: DC
  Administered 2014-02-04: 40 mg via ORAL
  Filled 2014-02-04: qty 1

## 2014-02-04 MED ORDER — FUROSEMIDE 20 MG PO TABS
20.0000 mg | ORAL_TABLET | Freq: Every day | ORAL | Status: DC
  Administered 2014-02-04 – 2014-02-05 (×2): 20 mg via ORAL
  Filled 2014-02-04 (×2): qty 1

## 2014-02-04 MED ORDER — SPIRONOLACTONE 50 MG PO TABS
50.0000 mg | ORAL_TABLET | Freq: Every day | ORAL | Status: DC
  Administered 2014-02-05: 50 mg via ORAL
  Filled 2014-02-04: qty 1

## 2014-02-04 MED ORDER — RIFAXIMIN 550 MG PO TABS
550.0000 mg | ORAL_TABLET | Freq: Two times a day (BID) | ORAL | Status: DC
  Administered 2014-02-04 – 2014-02-05 (×2): 550 mg via ORAL
  Filled 2014-02-04 (×3): qty 1

## 2014-02-04 MED ORDER — LACTULOSE 10 GM/15ML PO SOLN
30.0000 g | Freq: Three times a day (TID) | ORAL | Status: DC
  Administered 2014-02-04: 30 g via ORAL
  Administered 2014-02-05: 10 g via ORAL
  Filled 2014-02-04 (×4): qty 45

## 2014-02-04 MED ORDER — INSULIN GLARGINE 100 UNIT/ML ~~LOC~~ SOLN
45.0000 [IU] | Freq: Every day | SUBCUTANEOUS | Status: DC
  Administered 2014-02-04: 45 [IU] via SUBCUTANEOUS
  Filled 2014-02-04: qty 0.45

## 2014-02-04 MED ORDER — OXYCODONE HCL 5 MG PO TABS
10.0000 mg | ORAL_TABLET | Freq: Three times a day (TID) | ORAL | Status: DC
  Administered 2014-02-04 – 2014-02-05 (×2): 10 mg via ORAL
  Filled 2014-02-04 (×2): qty 2

## 2014-02-04 MED ORDER — INSULIN ASPART 100 UNIT/ML ~~LOC~~ SOLN
6.0000 [IU] | Freq: Three times a day (TID) | SUBCUTANEOUS | Status: DC
  Administered 2014-02-05: 100 [IU] via SUBCUTANEOUS
  Administered 2014-02-05: 6 [IU] via SUBCUTANEOUS
  Filled 2014-02-04: qty 1

## 2014-02-04 MED ORDER — PROPRANOLOL HCL 20 MG PO TABS
20.0000 mg | ORAL_TABLET | Freq: Every day | ORAL | Status: DC
  Administered 2014-02-05: 20 mg via ORAL
  Filled 2014-02-04: qty 1
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
## 2014-02-04 NOTE — ED Notes (Signed)
Pt wanded by security. 
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
## 2014-02-04 NOTE — ED Notes (Signed)
Patient ambulatory with steady gait escorted by [facility] RN. Respiration equal and unlabored, skin warm and dry. No acute distress noted.

## 2014-02-04 NOTE — ED Notes (Signed)
Pt. Is unable to use the restroom at this time, but is aware that we need a urine specimen.  

## 2014-02-04 NOTE — ED Notes (Signed)
Pt eating at this time.  Pt given scrubs to change in to when he finishes.

## 2014-02-04 NOTE — ED Notes (Signed)
Bed: MGQ67 Expected date:  Expected time:  Means of arrival:  Comments: traige [place]

## 2014-02-05 ENCOUNTER — Observation Stay (HOSPITAL_COMMUNITY): Admission: EM | Admit: 2014-02-05 | Payer: Medicare PPO | Source: Intra-hospital | Admitting: Psychiatry

## 2014-02-05 ENCOUNTER — Observation Stay (HOSPITAL_COMMUNITY)
Admission: AD | Admit: 2014-02-05 | Discharge: 2014-02-06 | Disposition: A | Discharge status: Home / Self Care | Payer: Medicare PPO | Source: Intra-hospital | Attending: Psychiatry | Admitting: Psychiatry

## 2014-02-05 DIAGNOSIS — F3130 Bipolar disorder, current episode depressed, mild or moderate severity, unspecified: Secondary | ICD-10-CM

## 2014-02-05 DIAGNOSIS — F331 Major depressive disorder, recurrent, moderate: Secondary | ICD-10-CM | POA: Diagnosis present

## 2014-02-05 DIAGNOSIS — F411 Generalized anxiety disorder: Secondary | ICD-10-CM

## 2014-02-05 DIAGNOSIS — F1010 Alcohol abuse, uncomplicated: Secondary | ICD-10-CM

## 2014-02-05 LAB — CBG MONITORING, ED: GLUCOSE-CAPILLARY: 140 mg/dL — AB (ref 70–99)

## 2014-02-05 LAB — GLUCOSE, CAPILLARY: Glucose-Capillary: 136 mg/dL — ABNORMAL HIGH (ref 70–99)

## 2014-02-05 LAB — RAPID URINE DRUG SCREEN, HOSP PERFORMED
Amphetamines: NOT DETECTED
BENZODIAZEPINES: POSITIVE — AB
Barbiturates: POSITIVE — AB
Cocaine: NOT DETECTED
Opiates: NOT DETECTED
Tetrahydrocannabinol: NOT DETECTED

## 2014-02-05 MED ORDER — FUROSEMIDE 20 MG PO TABS
20.0000 mg | ORAL_TABLET | Freq: Every day | ORAL | Status: DC
  Administered 2014-02-06: 20 mg via ORAL
  Filled 2014-02-05 (×3): qty 1

## 2014-02-05 MED ORDER — INSULIN ASPART 100 UNIT/ML ~~LOC~~ SOLN
0.0000 [IU] | Freq: Three times a day (TID) | SUBCUTANEOUS | Status: DC
Start: 2014-02-06 — End: 2014-02-06
  Administered 2014-02-05: 2 [IU] via SUBCUTANEOUS

## 2014-02-05 MED ORDER — AMITRIPTYLINE HCL 25 MG PO TABS
25.0000 mg | ORAL_TABLET | Freq: Every evening | ORAL | Status: DC | PRN
  Administered 2014-02-05: 25 mg via ORAL
  Filled 2014-02-05: qty 1

## 2014-02-05 MED ORDER — RIFAXIMIN 550 MG PO TABS
550.0000 mg | ORAL_TABLET | Freq: Two times a day (BID) | ORAL | Status: DC
  Administered 2014-02-05 – 2014-02-06 (×2): 550 mg via ORAL
  Filled 2014-02-05 (×4): qty 1

## 2014-02-05 MED ORDER — PANTOPRAZOLE SODIUM 40 MG PO TBEC
40.0000 mg | DELAYED_RELEASE_TABLET | Freq: Every day | ORAL | Status: DC
  Administered 2014-02-05: 40 mg via ORAL
  Filled 2014-02-05 (×3): qty 1

## 2014-02-05 MED ORDER — PROPRANOLOL HCL 20 MG PO TABS
20.0000 mg | ORAL_TABLET | Freq: Every day | ORAL | Status: DC
  Administered 2014-02-06: 20 mg via ORAL
  Filled 2014-02-05: qty 1
  Filled 2014-02-05: qty 2
  Filled 2014-02-05: qty 1

## 2014-02-05 MED ORDER — INSULIN GLARGINE 100 UNIT/ML ~~LOC~~ SOLN
45.0000 [IU] | Freq: Every day | SUBCUTANEOUS | Status: DC
  Administered 2014-02-05: 45 [IU] via SUBCUTANEOUS

## 2014-02-05 MED ORDER — SPIRONOLACTONE 50 MG PO TABS
50.0000 mg | ORAL_TABLET | Freq: Every day | ORAL | Status: DC
  Administered 2014-02-06: 50 mg via ORAL
  Filled 2014-02-05 (×2): qty 1

## 2014-02-05 MED ORDER — ACETAMINOPHEN 325 MG PO TABS
650.0000 mg | ORAL_TABLET | Freq: Four times a day (QID) | ORAL | Status: DC | PRN
  Administered 2014-02-05 – 2014-02-06 (×2): 650 mg via ORAL
  Filled 2014-02-05 (×2): qty 2

## 2014-02-05 MED ORDER — INSULIN ASPART 100 UNIT/ML ~~LOC~~ SOLN: 6.0000 [IU] | Freq: Three times a day (TID) | SUBCUTANEOUS | Status: DC

## 2014-02-05 MED ORDER — LACTULOSE 10 GM/15ML PO SOLN
30.0000 g | Freq: Three times a day (TID) | ORAL | Status: DC
  Administered 2014-02-05 – 2014-02-06 (×3): 30 g via ORAL
  Filled 2014-02-05 (×6): qty 45

## 2014-02-05 MED ORDER — OXYCODONE HCL 5 MG PO TABS
10.0000 mg | ORAL_TABLET | Freq: Three times a day (TID) | ORAL | Status: DC
  Administered 2014-02-05 – 2014-02-06 (×3): 10 mg via ORAL
  Filled 2014-02-05 (×3): qty 2

## 2014-02-05 MED ORDER — ALPRAZOLAM 1 MG PO TABS
1.0000 mg | ORAL_TABLET | Freq: Every evening | ORAL | Status: DC | PRN
  Administered 2014-02-06: 1 mg via ORAL
  Filled 2014-02-05: qty 1

## 2014-02-05 MED ORDER — MAGNESIUM HYDROXIDE 400 MG/5ML PO SUSP: 30.0000 mL | Freq: Every day | ORAL | Status: DC | PRN

## 2014-02-05 MED ORDER — ALUM & MAG HYDROXIDE-SIMETH 200-200-20 MG/5ML PO SUSP: 30.0000 mL | ORAL | Status: DC | PRN

## 2014-02-05 NOTE — Plan of Care (Signed)
[facility] Crisis Plan  Reason for Crisis Plan:  Medication Management   Plan of Care:  Outpatient Therapy  Family Support:   lives with daughter and mom is supportive as well.  Current Living Environment:  Living Arrangements: Children  Insurance:   Hospital Account   Name Acct ID Class Status Primary Coverage   [name], [name] [id number] [place] - [employer] MEDICARE CHOICE PPO        Guarantor Account (for Hospital Account [id number])   Name Relation to [place]? Acct Type   [name], [name]   Address Phone       [address] [place], [place] [address] [phone](H)          Coverage Information (for Hospital Account [id number])   F/O Payor/Plan Precert #   [facility] MEDICARE CHOICE PPO    Subscriber Subscriber #   [name], [name] [id number]   Address Phone   [address] [place] [address] [phone]      Legal Guardian:     Primary Care Provider:  Barbette Merino, MD  Current Outpatient Providers:  pt. unsure  Psychiatrist:     Counselor/Therapist:     Compliant with Medications:  Yes   Additional Information:   [name] 9/16/20153:00 PM
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
## 2014-02-05 NOTE — Consult Note (Signed)
  Psychiatric Specialty Exam: Physical Exam  ROS  Blood pressure 124/64, pulse 66, temperature 98 F (36.7 C), temperature source Oral, resp. rate 16, SpO2 100.00%.There is no weight on file to calculate BMI.  General Appearance: Casual  Eye Contact::  Good  Speech:  Clear and Coherent  Volume:  Normal  Mood:  Depressed  Affect:  Appropriate  Thought Process:  Coherent and Logical  Orientation:  Full (Time, Place, and Person)  Thought Content:  Negative  Suicidal Thoughts:  No  Homicidal Thoughts:  No  Memory:  Immediate;   Good Recent;   Good Remote;   Good  Judgement:  Fair  Insight:  Fair  Psychomotor Activity:  Normal  Concentration:  Good  Recall:  Good  Akathisia:  Negative  Handed:  Right  AIMS (if indicated):     Assets:  Communication Skills Desire for Improvement Housing Social Support  Sleep:   adequate   Mr Morgenstern says he has some suicidal thoughts but would not kill himself because of his children and grandchildren.  He cannot reliably contract for safety at home.  He seems very passive in handling his affairs.  He has been accepted to OBS bed 3 for continued observation.

## 2014-02-05 NOTE — BH Assessment (Signed)
Per [name], PA, pt can be d/c'd after 8am with referrals for outpt svcs and no harm contract signed.

## 2014-02-05 NOTE — Progress Notes (Signed)
D) Pt is a 58 y/o caucasian male admitted to obs unit for major recurrent depression with suicidal ideation. Pt. denies plan at present. Contracts for safety while hospitalized, but unsure about being safe at home if d/c. Per chart pt does have multiple medical problems including COPD, HTN, Diabetes, Neuropathy, edema in bilateral lower extremities. Vitals stable without apparent distress at present.  [name] gait is unsteady, needs minimal assistance with getting OOB and standby assist during ambulation. Pt. reported a recent fall at home, resulting in left arm strain.  Pt alert & oriented to self, place, time and situation.  Calm / cooperative with initial assessment. Reported depressed mood with sleep disturbance. Unit orientation done after search was completed. A) Emotional support and availability offered. Lunch and fluid given to pt. Q15 minutes checks initiated and maintained for safety. R)  Pt receptive to care. Pt tolerated lunch well. Safety maintained while obs Unit.

## 2014-02-05 NOTE — ED Notes (Signed)
TTS consult in progress. °

## 2014-02-05 NOTE — Progress Notes (Signed)
T/C from Guadelupe Sabin NP responding to previous shift call. Care order intstruction given.

## 2014-02-05 NOTE — BH Assessment (Signed)
Per Dr. Lovena Le patient meets criteria for OBS Unit Bed 3.  The nurse will have the patient sign the support paperwork and then fax the paperwork to [facility].  The nurse will arrange transportation with [place].    The NP Shuvon wants the patient to have a scheduled appointment an outpatient provider before he is discharged from the OBS Unit.
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
## 2014-02-05 NOTE — Progress Notes (Signed)
Pt alert and resting in bed. Denies SI/HI, verbally contracts for safety. -A/[place]. C/o feet pain and generalized weakness. see MAR. Emotional support and encouragement given. Will continue to monitor closely and evaluate for stabilization.

## 2014-02-05 NOTE — Progress Notes (Signed)
[name] INPATIENT:  Family/Significant Other Suicide Prevention Education  Suicide Prevention Education:  Patient Refusal for Family/Significant Other Suicide Prevention Education: The patient [name] has refused to provide written consent for family/significant other to be provided Family/Significant Other Suicide Prevention Education during admission and/or prior to discharge.  Physician notified.  [place] [employer] 02/05/2014, 2:53 PM

## 2014-02-06 DIAGNOSIS — F331 Major depressive disorder, recurrent, moderate: Secondary | ICD-10-CM

## 2014-02-06 LAB — GLUCOSE, CAPILLARY
GLUCOSE-CAPILLARY: 98 mg/dL (ref 70–99)
Glucose-Capillary: 124 mg/dL — ABNORMAL HIGH (ref 70–99)
Glucose-Capillary: 115 mg/dL — ABNORMAL HIGH (ref 70–99)

## 2014-02-06 NOTE — [facility] Suicide Risk Assessment (Signed)
Suicide Risk Assessment  Discharge Assessment     Demographic Factors:  Male and Caucasian  Total Time spent with patient: 20 minutes  Psychiatric Specialty Exam:     Blood pressure 116/52, pulse 66, temperature 97.5 F (36.4 C), temperature source Oral, resp. rate 18, height 5\' 10"  (1.778 m), weight 93.441 kg (206 lb), SpO2 100.00%.Body mass index is 29.56 kg/(m^2).  General Appearance: Disheveled  Eye Sport and exercise psychologist::  Fair  Speech:  Clear and Coherent  Volume:  Decreased  Mood:  Euthymic  Affect:  Appropriate  Thought Process:  Coherent and Goal Directed  Orientation:  Full (Time, Place, and Person)  Thought Content:  symptoms worries concerns  Suicidal Thoughts:  No  Homicidal Thoughts:  No  Memory:  Immediate;   Fair Recent;   Fair Remote;   Fair  Judgement:  Fair  Insight:  Present  Psychomotor Activity:  Normal  Concentration:  Fair  Recall:  [employer] of Knowledge:NA  Language: Fair  Akathisia:  No  Handed:    AIMS (if indicated):     Assets:  Desire for Improvement Housing  Sleep:       Musculoskeletal: Strength & Muscle Tone: within normal limits Gait & Station: normal Patient leans: N/A   Mental Status Per Nursing Assessment::   On Admission:  Suicidal ideation indicated by patient  Current Mental Status by Physician: In full contact with reality. Denies SI plans or intent. Has needs having to do with general medical management. He will go back home with his daughter. He has an outpatient provider who he plans to call for an appointment  Loss Factors: Loss of significant relationship and Decline in physical health  Historical Factors: NA  Risk Reduction Factors:   Sense of responsibility to family and Living with another person, especially a relative  Continued Clinical Symptoms: Depression   Cognitive Features That Contribute To Risk:  Polarized thinking Thought constriction (tunnel vision)    Suicide Risk:  Minimal: No identifiable  suicidal ideation.  Patients presenting with no risk factors but with morbid ruminations; may be classified as minimal risk based on the severity of the depressive symptoms  Discharge Diagnoses:   AXIS I:  Major Depression recurrent, moderate AXIS II:  No diagnosis AXIS III:   Past Medical History  Diagnosis Date  . Neuropathy   . Diabetes mellitus   . Bipolar affect, depressed   . Hypertension   . Arthritis   . Stroke     Mini stroke about 36yrs ago  . Cirrhosis   . Alcohol abuse   . Chronic pain   . Cocaine abuse   . Muscle spasm     both legs  . Encephalopathy, hepatic   . Detached retina   . COPD (chronic obstructive pulmonary disease)     emphysema  . Bronchitis   . Barrett's esophagus   . GERD (gastroesophageal reflux disease)     has ulcer  . Anemia    AXIS IV:  other psychosocial or environmental problems AXIS V:  61-70 mild symptoms  Plan Of Care/Follow-up recommendations:  Activity:  as tolerated Diet:  regular Follow up outpatient basis(use the information given for outpatient resources) Is patient on multiple antipsychotic therapies at discharge:  No   Has Patient had three or more failed trials of antipsychotic monotherapy by history:  No  Recommended Plan for Multiple Antipsychotic Therapies: NA    Oyinkansola Truax A 02/06/2014, 4:24 PM

## 2014-02-06 NOTE — Progress Notes (Addendum)
11:15am:  Met with patient who at this time wants to return home with his daughter who is a CNA. He does have DME already in the home, cane, potty chair, walker and reports no SI present today. Reports he wants to see his grandchildren 61, 42, and 58 years old. Patient was pleasant in affect, reports being in some pain, but overall cooperative.  He does have a PCP whom he sees regularly : Dr. Jonelle Sidle and reports he has appointment. Daughter also manages appointments.  Plan to DC home when cleared by MD.   LCSW spoke with obs unit staff along with [facility]. Will speak with Dr. Sabra Heck regarding disposition and recommendations. Patient is passively suicidal and lives with daughter. He is a high fall risk with no psych follow up and not at baseline.  Patient would benefit from an inpatient admission to have a physical therapy consult to determine if patient is appropriate for a skilled nursing facility to decrease admissions back to ED along with inpatient.  Patient has been in ED over 12 times. He does not qualify for an ACT team due to insurance.  Dispo pending at this time.  Lane Hacker, MSW Clinical Social Work:  [facility] [phone]

## 2014-02-06 NOTE — Progress Notes (Signed)
Patient ID: [name], male   DOB: 1956-04-14, 58 y.o.   MRN: [id number] Discharge Note-Dr. Sabra Heck in to see him and discharged him home. Unable to reach Jarrett Soho the SW that saw him earlier but Dr. Nani Gasser recommendation was to follow up with his primary care Dr. So no further discharge plans or referral needed. He denies any thoughts to hurt self.States when hes home sometime he just gets worked up and reacts too fast and comes to the hospital He states he has a friend that says he can call him at any time, even at 2 am. Encouraged to use that resource the next time he gets upset.Pleasant and thanked staff for their help. Reviewed discharge to follow up with his medical dr and he expressed his understanding. His daughter was called by him to pick him up. No prescriptions were sent home with him.All property returned to him. Daughter arrived to pick him up for home.

## 2014-02-06 NOTE — Progress Notes (Signed)
Patient ID: [name], male   DOB: April 01, 1956, 58 y.o.   MRN: [id number] D-States he had a bad night last night, restless and feet are painful from swelling. Pleasant, displays humor. Verbal and appropriate. A-Support offered. Monitored for safety Medications as ordered. R-Planning for discharge today he wants a psychiatrist to follow up with. Seen by Evaristo Bury today to help with discharge plans. He is iving with his daughter and feels that is enough for now. No complaints voiced.

## 2014-02-07 NOTE — ED Provider Notes (Signed)
Medical screening examination/treatment/procedure(s) were performed by non-physician practitioner and as supervising physician I was immediately available for consultation/collaboration.   EKG Interpretation None        Houston Siren III, MD 02/07/14 [phone]

## 2014-02-08 ENCOUNTER — Emergency Department (HOSPITAL_COMMUNITY): Payer: Medicare PPO

## 2014-02-08 ENCOUNTER — Encounter (HOSPITAL_COMMUNITY): Payer: Self-pay | Admitting: Emergency Medicine

## 2014-02-08 ENCOUNTER — Emergency Department (HOSPITAL_COMMUNITY)
Admission: EM | Admit: 2014-02-08 | Discharge: 2014-02-08 | Disposition: A | Discharge status: Home / Self Care | Payer: Medicare PPO | Attending: Emergency Medicine | Admitting: Emergency Medicine

## 2014-02-08 DIAGNOSIS — Z794 Long term (current) use of insulin: Secondary | ICD-10-CM | POA: Insufficient documentation

## 2014-02-08 DIAGNOSIS — W1809XA Striking against other object with subsequent fall, initial encounter: Secondary | ICD-10-CM | POA: Diagnosis not present

## 2014-02-08 DIAGNOSIS — I10 Essential (primary) hypertension: Secondary | ICD-10-CM | POA: Insufficient documentation

## 2014-02-08 DIAGNOSIS — M129 Arthropathy, unspecified: Secondary | ICD-10-CM | POA: Diagnosis not present

## 2014-02-08 DIAGNOSIS — K219 Gastro-esophageal reflux disease without esophagitis: Secondary | ICD-10-CM | POA: Insufficient documentation

## 2014-02-08 DIAGNOSIS — F3130 Bipolar disorder, current episode depressed, mild or moderate severity, unspecified: Secondary | ICD-10-CM | POA: Diagnosis not present

## 2014-02-08 DIAGNOSIS — Y9289 Other specified places as the place of occurrence of the external cause: Secondary | ICD-10-CM | POA: Diagnosis not present

## 2014-02-08 DIAGNOSIS — F17200 Nicotine dependence, unspecified, uncomplicated: Secondary | ICD-10-CM | POA: Insufficient documentation

## 2014-02-08 DIAGNOSIS — S60229A Contusion of unspecified hand, initial encounter: Secondary | ICD-10-CM | POA: Diagnosis not present

## 2014-02-08 DIAGNOSIS — J449 Chronic obstructive pulmonary disease, unspecified: Secondary | ICD-10-CM | POA: Diagnosis not present

## 2014-02-08 DIAGNOSIS — Y9389 Activity, other specified: Secondary | ICD-10-CM | POA: Insufficient documentation

## 2014-02-08 DIAGNOSIS — R609 Edema, unspecified: Secondary | ICD-10-CM | POA: Diagnosis not present

## 2014-02-08 DIAGNOSIS — S0990XA Unspecified injury of head, initial encounter: Secondary | ICD-10-CM | POA: Diagnosis not present

## 2014-02-08 DIAGNOSIS — Z862 Personal history of diseases of the blood and blood-forming organs and certain disorders involving the immune mechanism: Secondary | ICD-10-CM | POA: Insufficient documentation

## 2014-02-08 DIAGNOSIS — Z79899 Other long term (current) drug therapy: Secondary | ICD-10-CM | POA: Diagnosis not present

## 2014-02-08 DIAGNOSIS — G589 Mononeuropathy, unspecified: Secondary | ICD-10-CM | POA: Diagnosis not present

## 2014-02-08 DIAGNOSIS — J4489 Other specified chronic obstructive pulmonary disease: Secondary | ICD-10-CM | POA: Insufficient documentation

## 2014-02-08 DIAGNOSIS — I959 Hypotension, unspecified: Secondary | ICD-10-CM

## 2014-02-08 DIAGNOSIS — W19XXXA Unspecified fall, initial encounter: Secondary | ICD-10-CM

## 2014-02-08 DIAGNOSIS — E1149 Type 2 diabetes mellitus with other diabetic neurological complication: Secondary | ICD-10-CM | POA: Diagnosis not present

## 2014-02-08 DIAGNOSIS — Z8673 Personal history of transient ischemic attack (TIA), and cerebral infarction without residual deficits: Secondary | ICD-10-CM | POA: Insufficient documentation

## 2014-02-08 DIAGNOSIS — G8929 Other chronic pain: Secondary | ICD-10-CM | POA: Insufficient documentation

## 2014-02-08 DIAGNOSIS — M7989 Other specified soft tissue disorders: Secondary | ICD-10-CM | POA: Diagnosis present

## 2014-02-08 DIAGNOSIS — E1140 Type 2 diabetes mellitus with diabetic neuropathy, unspecified: Secondary | ICD-10-CM

## 2014-02-08 DIAGNOSIS — S60222A Contusion of left hand, initial encounter: Secondary | ICD-10-CM

## 2014-02-08 LAB — COMPREHENSIVE METABOLIC PANEL
ALK PHOS: 82 U/L (ref 39–117)
ALT: 18 U/L (ref 0–53)
ANION GAP: 14 (ref 5–15)
AST: 46 U/L — ABNORMAL HIGH (ref 0–37)
Albumin: 2.6 g/dL — ABNORMAL LOW (ref 3.5–5.2)
BUN: 23 mg/dL (ref 6–23)
CHLORIDE: 99 meq/L (ref 96–112)
CO2: 23 mEq/L (ref 19–32)
CREATININE: 2.12 mg/dL — AB (ref 0.50–1.35)
Calcium: 7.9 mg/dL — ABNORMAL LOW (ref 8.4–10.5)
GFR, EST AFRICAN AMERICAN: 38 mL/min — AB (ref >90)
GFR, EST NON AFRICAN AMERICAN: 33 mL/min — AB (ref >90)
GLUCOSE: 67 mg/dL — AB (ref 70–99)
POTASSIUM: 4 meq/L (ref 3.7–5.3)
Sodium: 136 mEq/L — ABNORMAL LOW (ref 137–147)
Total Bilirubin: 1.9 mg/dL — ABNORMAL HIGH (ref 0.3–1.2)
Total Protein: 5.5 g/dL — ABNORMAL LOW (ref 6.0–8.3)

## 2014-02-08 LAB — CBC
HCT: 25.8 % — ABNORMAL LOW (ref 39.0–52.0)
HEMOGLOBIN: 9 g/dL — AB (ref 13.0–17.0)
MCH: 31.3 pg (ref 26.0–34.0)
MCHC: 34.9 g/dL (ref 30.0–36.0)
MCV: 89.6 fL (ref 78.0–100.0)
Platelets: 50 10*3/uL — ABNORMAL LOW (ref 150–400)
RBC: 2.88 MIL/uL — AB (ref 4.22–5.81)
RDW: 16.9 % — ABNORMAL HIGH (ref 11.5–15.5)
WBC: 4.5 10*3/uL (ref 4.0–10.5)

## 2014-02-08 LAB — TROPONIN I: Troponin I: <0.3 ng/mL (ref <0.30)

## 2014-02-08 LAB — PRO B NATRIURETIC PEPTIDE: Pro B Natriuretic peptide (BNP): 966.3 pg/mL — ABNORMAL HIGH (ref 0–125)

## 2014-02-08 LAB — PROTIME-INR
INR: 1.58 — ABNORMAL HIGH (ref 0.00–1.49)
PROTHROMBIN TIME: 18.9 s — AB (ref 11.6–15.2)

## 2014-02-08 LAB — CBG MONITORING, ED: GLUCOSE-CAPILLARY: 79 mg/dL (ref 70–99)

## 2014-02-08 MED ORDER — SODIUM CHLORIDE 0.9 % IV BOLUS (SEPSIS): 500.0000 mL | Freq: Once | INTRAVENOUS | Status: DC

## 2014-02-08 MED ORDER — FENTANYL CITRATE 0.05 MG/ML IJ SOLN
50.0000 ug | Freq: Once | INTRAMUSCULAR | Status: AC
  Administered 2014-02-08: 50 ug via INTRAVENOUS
  Filled 2014-02-08: qty 2

## 2014-02-08 MED ORDER — SODIUM CHLORIDE 0.9 % IV BOLUS (SEPSIS)
500.0000 mL | Freq: Once | INTRAVENOUS | Status: AC
  Administered 2014-02-08: 500 mL via INTRAVENOUS

## 2014-02-08 NOTE — Discharge Instructions (Signed)
Edema °Edema is an abnormal buildup of fluids in your body tissues. Edema is somewhat dependent on gravity to pull the fluid to the lowest place in your body. That makes the condition more common in the legs and thighs (lower extremities). Painless swelling of the feet and ankles is common and becomes more likely as you get older. It is also common in looser tissues, like around your eyes.  °When the affected area is squeezed, the fluid may move out of that spot and leave a dent for a few moments. This dent is called pitting.  °CAUSES  °There are many possible causes of edema. Eating too much salt and being on your feet or sitting for a long time can cause edema in your legs and ankles. Hot weather may make edema worse. Common medical causes of edema include: °· Heart failure. °· Liver disease. °· Kidney disease. °· Weak blood vessels in your legs. °· Cancer. °· An injury. °· Pregnancy. °· Some medications. °· Obesity.  °SYMPTOMS  °Edema is usually painless. Your skin may look swollen or shiny.  °DIAGNOSIS  °Your health care provider may be able to diagnose edema by asking about your medical history and doing a physical exam. You may need to have tests such as X-rays, an electrocardiogram, or blood tests to check for medical conditions that may cause edema.  °TREATMENT  °Edema treatment depends on the cause. If you have heart, liver, or kidney disease, you need the treatment appropriate for these conditions. General treatment may include: °· Elevation of the affected body part above the level of your heart. °· Compression of the affected body part. Pressure from elastic bandages or support stockings squeezes the tissues and forces fluid back into the blood vessels. This keeps fluid from entering the tissues. °· Restriction of fluid and salt intake. °· Use of a water pill (diuretic). These medications are appropriate only for some types of edema. They pull fluid out of your body and make you urinate more often. This  gets rid of fluid and reduces swelling, but diuretics can have side effects. Only use diuretics as directed by your health care provider. °HOME CARE INSTRUCTIONS  °· Keep the affected body part above the level of your heart when you are lying down.   °· Do not sit still or stand for prolonged periods.   °· Do not put anything directly under your knees when lying down. °· Do not wear constricting clothing or garters on your upper legs.   °· Exercise your legs to work the fluid back into your blood vessels. This may help the swelling go down.   °· Wear elastic bandages or support stockings to reduce ankle swelling as directed by your health care provider.   °· Eat a low-salt diet to reduce fluid if your health care provider recommends it.   °· Only take medicines as directed by your health care provider.  °SEEK MEDICAL CARE IF:  °· Your edema is not responding to treatment. °· You have heart, liver, or kidney disease and notice symptoms of edema. °· You have edema in your legs that does not improve after elevating them.   °· You have sudden and unexplained weight gain. °SEEK IMMEDIATE MEDICAL CARE IF:  °· You develop shortness of breath or chest pain.   °· You cannot breathe when you lie down. °· You develop pain, redness, or warmth in the swollen areas.   °· You have heart, liver, or kidney disease and suddenly get edema. °· You have a fever and your symptoms suddenly get worse. °MAKE SURE YOU:  °·   Understand these instructions.  Will watch your condition.  Will get help right away if you are not doing well or get worse. Document Released: 05/09/2005 Document Revised: 09/23/2013 Document Reviewed: 03/01/2013 [facility] Patient Information 2015 [place], [place]. This information is not intended to replace advice given to you by your health care provider. Make sure you discuss any questions you have with your health care provider.  Diabetic Neuropathy Diabetic neuropathy is a nerve disease or nerve damage that is  caused by diabetes mellitus. About half of all people with diabetes mellitus have some form of nerve damage. Nerve damage is more common in those who have had diabetes mellitus for many years and who generally have not had good control of their blood sugar (glucose) level. Diabetic neuropathy is a common complication of diabetes mellitus. There are three more common types of diabetic neuropathy and a fourth type that is less common and less understood:   Peripheral neuropathy--This is the most common type of diabetic neuropathy. It causes damage to the nerves of the feet and legs first and then eventually the hands and arms.The damage affects the ability to sense touch.  Autonomic neuropathy--This type causes damage to the autonomic nervous system, which controls the following functions:  Heartbeat.  Body temperature.  Blood pressure.  Urination.  Digestion.  Sweating.  Sexual function.  Focal neuropathy--Focal neuropathy can be painful and unpredictable and occurs most often in older adults with diabetes mellitus. It involves a specific nerve or one area and often comes on suddenly. It usually does not cause long-term problems.  Radiculoplexus neuropathy-- Sometimes called lumbosacral radiculoplexus neuropathy, radiculoplexus neuropathy affects the nerves of the thighs, hips, buttocks, or legs. It is more common in people with type 2 diabetes mellitus and in older men. It is characterized by debilitating pain, weakness, and atrophy, usually in the thigh muscles. CAUSES  The cause of peripheral, autonomic, and focal neuropathies is diabetes mellitus that is uncontrolled and high glucose levels. The cause of radiculoplexus neuropathy is unknown. However, it is thought to be caused by inflammation related to uncontrolled glucose levels. SIGNS AND SYMPTOMS  Peripheral Neuropathy Peripheral neuropathy develops slowly over time. When the nerves of the feet and legs no longer work there may be:    Burning, stabbing, or aching pain in the legs or feet.  Inability to feel pressure or pain in your feet. This can lead to:  Thick calluses over pressure areas.  Pressure sores.  Ulcers.  Foot deformities.  Reduced ability to feel temperature changes.  Muscle weakness. Autonomic Neuropathy The symptoms of autonomic neuropathy vary depending on which nerves are affected. Symptoms may include:  Problems with digestion, such as:  Feeling sick to your stomach (nausea).  Vomiting.  Bloating.  Constipation.  Diarrhea.  Abdominal pain.  Difficulty with urination. This occurs if you lose your ability to sense when your bladder is full. Problems include:  Urine leakage (incontinence).  Inability to empty your bladder completely (retention).  Rapid or irregular heartbeat (palpitations).  Blood pressure drops when you stand up (orthostatic hypotension). When you stand up you may feel:  Dizzy.  Weak.  Faint.  In men, inability to attain and maintain an erection.  In women, vaginal dryness and problems with decreased sexual desire and arousal.  Problems with body temperature regulation.  Increased or decreased sweating. Focal Neuropathy  Abnormal eye movements or abnormal alignment of both eyes.  Weakness in the wrist.  Foot drop. This results in an inability to lift the foot  properly and abnormal walking or foot movement.  Paralysis on one side of your face (Bell palsy).  Chest or abdominal pain. Radiculoplexus Neuropathy  Sudden, severe pain in your hip, thigh, or buttocks.  Weakness and wasting of thigh muscles.  Difficulty rising from a seated position.  Abdominal swelling.  Unexplained weight loss (usually more than 10 lb [4.5 kg]). DIAGNOSIS  Peripheral Neuropathy Your senses may be tested. Sensory function testing can be done with:  A light touch using a monofilament.  A vibration with tuning fork.  A sharp sensation with a pin  prick. Other tests that can help diagnose neuropathy are:  Nerve conduction velocity. This test checks the transmission of an electrical current through a nerve.  Electromyography. This shows how muscles respond to electrical signals transmitted by nearby nerves.  Quantitative sensory testing. This is used to assess how your nerves respond to vibrations and changes in temperature. Autonomic Neuropathy Diagnosis is often based on reported symptoms. Tell your health care provider if you experience:   Dizziness.   Constipation.   Diarrhea.   Inappropriate urination or inability to urinate.   Inability to get or maintain an erection.  Tests that may be done include:   Electrocardiography or Holter monitor. These are tests that can help show problems with the heart rate or heart rhythm.   An X-ray exam may be done. Focal Neuropathy Diagnosis is made based on your symptoms and what your health care provider finds during your exam. Other tests may be done. They may include:  Nerve conduction velocities. This checks the transmission of electrical current through a nerve.  Electromyography. This shows how muscles respond to electrical signals transmitted by nearby nerves.  Quantitative sensory testing. This test is used to assess how your nerves respond to vibration and changes in temperature. Radiculoplexus Neuropathy  Often the first thing is to eliminate any other issue or problems that might be the cause, as there is no stick test for diagnosis.  X-ray exam of your spine and lumbar region.  Spinal tap to rule out cancer.  MRI to rule out other lesions. TREATMENT  Once nerve damage occurs, it cannot be reversed. The goal of treatment is to keep the disease or nerve damage from getting worse and affecting more nerve fibers. Controlling your blood glucose level is the key. Most people with radiculoplexus neuropathy see at least a partial improvement over time. You will need  to keep your blood glucose and HbA1c levels in the target range determined by your health care provider. Things that help control blood glucose levels include:   Blood glucose monitoring.   Meal planning.   Physical activity.   Diabetes medicine.  Over time, maintaining lower blood glucose levels helps lessen symptoms. Sometimes, prescription pain medicine is needed. HOME CARE INSTRUCTIONS:  Do not smoke.  Keep your blood glucose level in the range that you and your health care provider have determined acceptable for you.  Keep your blood pressure level in the range that you and your health care provider have determined acceptable for you.  Eat a well-balanced diet.  Be active every day.  Check your feet every day. SEEK MEDICAL CARE IF:   You have burning, stabbing, or aching pain in the legs or feet.  You are unable to feel pressure or pain in your feet.  You develop problems with digestion such as:  Nausea.  Vomiting.  Bloating.  Constipation.  Diarrhea.  Abdominal pain.  You have difficulty with urination, such  as:  Incontinence.  Retention.  You have palpitations.  You develop orthostatic hypotension. When you stand up you may feel:  Dizzy.  Weak.  Faint.  You cannot attain and maintain an erection (in men).  You have vaginal dryness and problems with decreased sexual desire and arousal (in women).  You have severe pain in your thighs, legs, or buttocks.  You have unexplained weight loss. Document Released: 07/18/2001 Document Revised: 02/27/2013 Document Reviewed: 10/18/2012 [facility] Patient Information 2015 [place], [place]. This information is not intended to replace advice given to you by your health care provider. Make sure you discuss any questions you have with your health care provider.  Hand Contusion A hand contusion is a deep bruise on your hand area. Contusions are the result of an injury that caused bleeding under the skin. The  contusion may turn blue, purple, or yellow. Minor injuries will give you a painless contusion, but more severe contusions may stay painful and swollen for a few weeks. CAUSES  A contusion is usually caused by a blow, trauma, or direct force to an area of the body. SYMPTOMS   Swelling and redness of the injured area.  Discoloration of the injured area.  Tenderness and soreness of the injured area.  Pain. DIAGNOSIS  The diagnosis can be made by taking a history and performing a physical exam. An X-ray, CT scan, or MRI may be needed to determine if there were any associated injuries, such as broken bones (fractures). TREATMENT  Often, the best treatment for a hand contusion is resting, elevating, icing, and applying cold compresses to the injured area. Over-the-counter medicines may also be recommended for pain control. HOME CARE INSTRUCTIONS   Put ice on the injured area.  Put ice in a plastic bag.  Place a towel between your skin and the bag.  Leave the ice on for 15-20 minutes, 03-04 times a day.  Only take over-the-counter or prescription medicines as directed by your caregiver. Your caregiver may recommend avoiding anti-inflammatory medicines (aspirin, ibuprofen, and naproxen) for 48 hours because these medicines may increase bruising.  If told, use an elastic wrap as directed. This can help reduce swelling. You may remove the wrap for sleeping, showering, and bathing. If your fingers become numb, cold, or blue, take the wrap off and reapply it more loosely.  Elevate your hand with pillows to reduce swelling.  Avoid overusing your hand if it is painful. SEEK IMMEDIATE MEDICAL CARE IF:   You have increased redness, swelling, or pain in your hand.  Your swelling or pain is not relieved with medicines.  You have loss of feeling in your hand or are unable to move your fingers.  Your hand turns cold or blue.  You have pain when you move your fingers.  Your hand becomes warm  to the touch.  Your contusion does not improve in 2 days. MAKE SURE YOU:   Understand these instructions.  Will watch your condition.  Will get help right away if you are not doing well or get worse. Document Released: 10/29/2001 Document Revised: 02/01/2012 Document Reviewed: 10/31/2011 [facility] Patient Information 2015 [place], [place]. This information is not intended to replace advice given to you by your health care provider. Make sure you discuss any questions you have with your health care provider.  RICE: Routine Care for Injuries The routine care of many injuries includes Rest, Ice, Compression, and Elevation (RICE). HOME CARE INSTRUCTIONS  Rest is needed to allow your body to heal. Routine activities can  usually be resumed when comfortable. Injured tendons and bones can take up to 6 weeks to heal. Tendons are the cord-like structures that attach muscle to bone.  Ice following an injury helps keep the swelling down and reduces pain.  Put ice in a plastic bag.  Place a towel between your skin and the bag.  Leave the ice on for 15-20 minutes, 3-4 times a day, or as directed by your health care provider. Do this while awake, for the first 24 to 48 hours. After that, continue as directed by your caregiver.  Compression helps keep swelling down. It also gives support and helps with discomfort. If an elastic bandage has been applied, it should be removed and reapplied every 3 to 4 hours. It should not be applied tightly, but firmly enough to keep swelling down. Watch fingers or toes for swelling, bluish discoloration, coldness, numbness, or excessive pain. If any of these problems occur, remove the bandage and reapply loosely. Contact your caregiver if these problems continue.  Elevation helps reduce swelling and decreases pain. With extremities, such as the arms, hands, legs, and feet, the injured area should be placed near or above the level of the heart, if possible. SEEK IMMEDIATE  MEDICAL CARE IF:  You have persistent pain and swelling.  You develop redness, numbness, or unexpected weakness.  Your symptoms are getting worse rather than improving after several days. These symptoms may indicate that further evaluation or further X-rays are needed. Sometimes, X-rays may not show a small broken bone (fracture) until 1 week or 10 days later. Make a follow-up appointment with your caregiver. Ask when your X-ray results will be ready. Make sure you get your X-ray results. Document Released: 08/21/2000 Document Revised: 05/14/2013 Document Reviewed: 10/08/2010 [facility] Patient Information 2015 [place], [place]. This information is not intended to replace advice given to you by your health care provider. Make sure you discuss any questions you have with your health care provider.   Head Injury You have received a head injury. It does not appear serious at this time. Headaches and vomiting are common following head injury. It should be easy to awaken from sleeping. Sometimes it is necessary for you to stay in the emergency department for a while for observation. Sometimes admission to the hospital may be needed. After injuries such as yours, most problems occur within the first 24 hours, but side effects may occur up to 7-10 days after the injury. It is important for you to carefully monitor your condition and contact your health care provider or seek immediate medical care if there is a change in your condition. WHAT ARE THE TYPES OF HEAD INJURIES? Head injuries can be as minor as a bump. Some head injuries can be more severe. More severe head injuries include:  A jarring injury to the brain (concussion).  A bruise of the brain (contusion). This mean there is bleeding in the brain that can cause swelling.  A cracked skull (skull fracture).  Bleeding in the brain that collects, clots, and forms a bump (hematoma). WHAT CAUSES A HEAD INJURY? A serious head injury is most likely to  happen to someone who is in a car wreck and is not wearing a seat belt. Other causes of major head injuries include bicycle or motorcycle accidents, sports injuries, and falls. HOW ARE HEAD INJURIES DIAGNOSED? A complete history of the event leading to the injury and your current symptoms will be helpful in diagnosing head injuries. Many times, pictures of the brain, such  as CT or MRI are needed to see the extent of the injury. Often, an overnight hospital stay is necessary for observation.  WHEN SHOULD I SEEK IMMEDIATE MEDICAL CARE?  You should get help right away if:  You have confusion or drowsiness.  You feel sick to your stomach (nauseous) or have continued, forceful vomiting.  You have dizziness or unsteadiness that is getting worse.  You have severe, continued headaches not relieved by medicine. Only take over-the-counter or prescription medicines for pain, fever, or discomfort as directed by your health care provider.  You do not have normal function of the arms or legs or are unable to walk.  You notice changes in the black spots in the center of the colored part of your eye (pupil).  You have a clear or bloody fluid coming from your nose or ears.  You have a loss of vision. During the next 24 hours after the injury, you must stay with someone who can watch you for the warning signs. This person should contact local emergency services (911 in the [place] if you have seizures, you become unconscious, or you are unable to wake up. HOW CAN I PREVENT A HEAD INJURY IN THE FUTURE? The most important factor for preventing major head injuries is avoiding motor vehicle accidents. To minimize the potential for damage to your head, it is crucial to wear seat belts while riding in motor vehicles. Wearing helmets while bike riding and playing collision sports (like football) is also helpful. Also, avoiding dangerous activities around the house will further help reduce your risk of head injury.    WHEN CAN I RETURN TO NORMAL ACTIVITIES AND ATHLETICS? You should be reevaluated by your health care provider before returning to these activities. If you have any of the following symptoms, you should not return to activities or contact sports until 1 week after the symptoms have stopped:  Persistent headache.  Dizziness or vertigo.  Poor attention and concentration.  Confusion.  Memory problems.  Nausea or vomiting.  Fatigue or tire easily.  Irritability.  Intolerant of bright lights or loud noises.  Anxiety or depression.  Disturbed sleep. MAKE SURE YOU:   Understand these instructions.  Will watch your condition.  Will get help right away if you are not doing well or get worse. Document Released: 05/09/2005 Document Revised: 05/14/2013 Document Reviewed: 01/14/2013 [facility] Patient Information 2015 [place], [place]. This information is not intended to replace advice given to you by your health care provider. Make sure you discuss any questions you have with your health care provider.

## 2014-02-08 NOTE — ED Notes (Addendum)
Pt presents with bilateral foot swelling for the past 3 days, pedal pulses present.  Admits to pain and difficulty with ambulation.   Pt reports that he fell 3 days ago and injured left hand- bruising noted to hand.  Pt very poor historian.

## 2014-02-08 NOTE — ED Notes (Signed)
Pt provided with meal

## 2014-02-08 NOTE — ED Provider Notes (Signed)
TIME SEEN: 6:45 PM  CHIEF COMPLAINT: Lower extremity swelling and pain  HPI: Patient is a 58 year old male with history of hypertension, diabetes, CVA, prior history of alcohol and cocaine abuse, COPD, hepatic encephalopathy, neuropathy who presents to the emergency department with complaints of 3 days of lower extremity swelling and pain. He denies any injury. Denies any fevers, chills, cough, chest pain or shortness of breath, abdominal pain, vomiting or diarrhea. He states several days ago he also had a mechanical fall and he hit his head and landed on his left hand. He is left-hand dominant. No neck or back pain. No numbness, tingling or focal weakness.  ROS: See HPI Constitutional: no fever  Eyes: no drainage  ENT: no runny nose   Cardiovascular:  no chest pain  Resp: no SOB  GI: no vomiting GU: no dysuria Integumentary: no rash  Allergy: no hives  Musculoskeletal: no leg swelling  Neurological: no slurred speech ROS otherwise negative  PAST MEDICAL HISTORY/PAST SURGICAL HISTORY:  Past Medical History  Diagnosis Date  . Neuropathy   . Diabetes mellitus   . Bipolar affect, depressed   . Hypertension   . Arthritis   . Stroke     Mini stroke about 22yrs ago  . Cirrhosis   . Alcohol abuse   . Chronic pain   . Cocaine abuse   . Muscle spasm     both legs  . Encephalopathy, hepatic   . Detached retina   . COPD (chronic obstructive pulmonary disease)     emphysema  . Bronchitis   . Barrett's esophagus   . GERD (gastroesophageal reflux disease)     has ulcer  . Anemia     MEDICATIONS:  Prior to Admission medications   Medication Sig Start Date End Date Taking? Authorizing Provider  ALPRAZolam Duanne Moron) 0.5 MG tablet Take 2 tablets (1 mg total) by mouth at bedtime as needed for anxiety. 01/29/14   Thurnell Lose, MD  amitriptyline (ELAVIL) 25 MG tablet Take 25 mg by mouth at bedtime as needed for sleep.     Historical Provider, MD  diclofenac sodium (VOLTAREN) 1 % GEL  Apply 1 application topically 2 (two) times daily as needed (pain in shoulders and back.).     Historical Provider, MD  furosemide (LASIX) 20 MG tablet Take 1 tablet (20 mg total) by mouth daily. 01/29/14   Thurnell Lose, MD  ibuprofen (ADVIL,MOTRIN) 200 MG tablet Take 200 mg by mouth every 6 (six) hours as needed for moderate pain.    Historical Provider, MD  insulin aspart (NOVOLOG FLEXPEN) 100 UNIT/ML FlexPen Inject 6 Units into the skin 3 (three) times daily with meals.    Historical Provider, MD  Insulin Glargine (LANTUS SOLOSTAR) 100 UNIT/ML Solostar Pen Inject 45 Units into the skin at bedtime. 11/17/13   Malvin Johns, MD  lactulose (CHRONULAC) 10 GM/15ML solution Take 45 mLs (30 g total) by mouth 3 (three) times daily. Titrate to at least 3 BMs daily 01/29/14   Thurnell Lose, MD  Oxycodone HCl 10 MG TABS Take 10 mg by mouth 3 (three) times daily. scheduled    Historical Provider, MD  pantoprazole (PROTONIX) 40 MG tablet Take 40 mg by mouth at bedtime.     Historical Provider, MD  propranolol (INDERAL) 20 MG tablet Take 20 mg by mouth daily.     Historical Provider, MD  rifaximin (XIFAXAN) 550 MG TABS tablet Take 1 tablet (550 mg total) by mouth 2 (two) times daily. 01/29/14  Thurnell Lose, MD  spironolactone (ALDACTONE) 50 MG tablet Take 1 tablet (50 mg total) by mouth daily. 01/29/14   Thurnell Lose, MD    ALLERGIES:  No Known Allergies  SOCIAL HISTORY:  History  Substance Use Topics  . Smoking status: Current Every Day Smoker -- 1.00 packs/day for 30 years    Types: Cigarettes    Last Attempt to Quit: 04/06/2012  . Smokeless tobacco: Never Used     Comment: quit   . Alcohol Use: 0.0 oz/week     Comment: 12 pk beer daily  06/2013 - no alcohol since 11/2012    FAMILY HISTORY: Family History  Problem Relation Age of Onset  . Hypotension Mother     EXAM: BP 99/49  Pulse 63  Temp(Src) 98.2 F (36.8 C) (Oral)  Resp 29  Ht 5\' 11"  (1.803 m)  Wt 230 lb (104.327 kg)  BMI  32.09 kg/m2  SpO2 97% CONSTITUTIONAL: Alert and oriented x3 and responds appropriately to questions. Well-appearing; well-nourished; GCS 15 HEAD: Normocephalic; atraumatic EYES: Conjunctivae clear, PERRL, EOMI ENT: normal nose; no rhinorrhea; moist mucous membranes; pharynx without lesions noted; no dental injury;no septal hematoma NECK: Supple, no meningismus, no LAD; no midline spinal tenderness, step-off or deformity, no JVD CARD: RRR; S1 and S2 appreciated; no murmurs, no clicks, no rubs, no gallops RESP: Normal chest excursion without splinting or tachypnea; breath sounds clear and equal bilaterally; no wheezes, no rhonchi, no rales; chest wall stable, nontender to palpation ABD/GI: Normal bowel sounds; non-distended; soft, non-tender, no rebound, no guarding PELVIS:  stable, nontender to palpation BACK:  The back appears normal and is non-tender to palpation, there is no CVA tenderness; no midline spinal tenderness, step-off or deformity EXT: Normal ROM in all joints; non-tender to palpation; bilateral 2+ pitting edema to the distal lower extremities, 2+ DP pulses bilaterally; normal capillary refill; no cyanosis; ecchymosis to the dorsal left hand proximal to the fourth and fifth digits without any deformity, no tenderness over the wrist, no scaphoid tenderness or pain with axial loading of the thumb SKIN: Normal color for age and race; warm NEURO: Moves all extremities equally, sensation to light touch intact diffusely, cranial nerves II through XII intact PSYCH: The patient's mood and manner are appropriate. Grooming and personal hygiene are appropriate.  MEDICAL DECISION MAKING: Patient here with complaints of lower extremity swelling and pain. Suspect his pain is secondary to his chronic neuropathy. He states he is on Lasix 20 mg daily. He has no chest pain or shortness of breath his lungs are clear to auscultation. No history of CHF but does have a history of cirrhosis. No other sign of  iron overload on exam. He is mildly hypotensive here. Appears his blood pressure the passes range in the 500X to 381W systolic. Also complaining of left hand pain after a fall several days ago. We'll obtain labs including LFTs, INR, BNP. We'll obtain CT of his head, x-ray of his left hand. We'll give pain medication and a small amount of IV fluids and reassess.  ED PROGRESS: Labs are at his baseline other than mildly elevated creatinine of 2.12, has received IV fluids, will have him have this rechecked by his PCP. Blood pressure is markedly improved at 500 mL of fluid is now systolic is in the 299B. CT of his head and left hand x-ray show no acute abnormalities. There is a noted radiopaque density in the volar radial soft tissues he is not tender in this area there is  no lesion noted on his skin. His blood glucose is slightly low at 67. Will recheck CBG. Anticipate he can be discharged home. Have advised him that I suspect his pain in his lower extremities as for his chronic neuropathy. I have recommended that he keep his legs elevated wear compression stockings for his swelling. At this time I do not recommend increasing his Lasix given he was mildly hypotensive initially. He does not appear volume overload otherwise. He has no hypoxia, JVD, lungs are clear to auscultation. Will discharge home. He has outpatient followup. Discussed return precautions. He verbalizes understanding and is with plan.     EKG Interpretation  Date/Time:  Saturday February 08 2014 19:01:23 EDT Ventricular Rate:  61 PR Interval:  167 QRS Duration: 96 QT Interval:  476 QTC Calculation: 479 R Axis:   36 Text Interpretation:  Sinus rhythm Borderline prolonged QT interval Confirmed by Willo Yoon,  DO, Leone Putman [phone]) on 02/08/2014 8:45:15 PM        San German, DO 02/08/14 2214

## 2014-02-08 NOTE — ED Notes (Signed)
Pt comfortable with discharge and follow up instructions. No Prescriptions

## 2014-02-10 ENCOUNTER — Emergency Department (HOSPITAL_COMMUNITY)
Admission: EM | Admit: 2014-02-10 | Discharge: 2014-02-10 | Disposition: A | Discharge status: Home / Self Care | Payer: Medicare PPO | Attending: Emergency Medicine | Admitting: Emergency Medicine

## 2014-02-10 ENCOUNTER — Encounter (HOSPITAL_COMMUNITY): Payer: Self-pay | Admitting: Emergency Medicine

## 2014-02-10 ENCOUNTER — Emergency Department (HOSPITAL_COMMUNITY): Payer: Medicare PPO

## 2014-02-10 DIAGNOSIS — S0083XA Contusion of other part of head, initial encounter: Principal | ICD-10-CM | POA: Insufficient documentation

## 2014-02-10 DIAGNOSIS — K219 Gastro-esophageal reflux disease without esophagitis: Secondary | ICD-10-CM | POA: Diagnosis not present

## 2014-02-10 DIAGNOSIS — S60219A Contusion of unspecified wrist, initial encounter: Secondary | ICD-10-CM | POA: Insufficient documentation

## 2014-02-10 DIAGNOSIS — Z8673 Personal history of transient ischemic attack (TIA), and cerebral infarction without residual deficits: Secondary | ICD-10-CM | POA: Diagnosis not present

## 2014-02-10 DIAGNOSIS — M129 Arthropathy, unspecified: Secondary | ICD-10-CM | POA: Diagnosis not present

## 2014-02-10 DIAGNOSIS — F3130 Bipolar disorder, current episode depressed, mild or moderate severity, unspecified: Secondary | ICD-10-CM | POA: Diagnosis not present

## 2014-02-10 DIAGNOSIS — F17200 Nicotine dependence, unspecified, uncomplicated: Secondary | ICD-10-CM | POA: Insufficient documentation

## 2014-02-10 DIAGNOSIS — K7682 Hepatic encephalopathy: Secondary | ICD-10-CM | POA: Diagnosis not present

## 2014-02-10 DIAGNOSIS — K7290 Hepatic failure, unspecified without coma: Secondary | ICD-10-CM | POA: Diagnosis not present

## 2014-02-10 DIAGNOSIS — Z794 Long term (current) use of insulin: Secondary | ICD-10-CM | POA: Diagnosis not present

## 2014-02-10 DIAGNOSIS — S0990XA Unspecified injury of head, initial encounter: Secondary | ICD-10-CM | POA: Insufficient documentation

## 2014-02-10 DIAGNOSIS — S0093XA Contusion of unspecified part of head, initial encounter: Secondary | ICD-10-CM

## 2014-02-10 DIAGNOSIS — G8929 Other chronic pain: Secondary | ICD-10-CM | POA: Insufficient documentation

## 2014-02-10 DIAGNOSIS — Y9289 Other specified places as the place of occurrence of the external cause: Secondary | ICD-10-CM | POA: Diagnosis not present

## 2014-02-10 DIAGNOSIS — W1809XA Striking against other object with subsequent fall, initial encounter: Secondary | ICD-10-CM | POA: Diagnosis not present

## 2014-02-10 DIAGNOSIS — S1093XA Contusion of unspecified part of neck, initial encounter: Principal | ICD-10-CM

## 2014-02-10 DIAGNOSIS — G589 Mononeuropathy, unspecified: Secondary | ICD-10-CM | POA: Insufficient documentation

## 2014-02-10 DIAGNOSIS — J441 Chronic obstructive pulmonary disease with (acute) exacerbation: Secondary | ICD-10-CM | POA: Diagnosis not present

## 2014-02-10 DIAGNOSIS — S60212A Contusion of left wrist, initial encounter: Secondary | ICD-10-CM

## 2014-02-10 DIAGNOSIS — Z862 Personal history of diseases of the blood and blood-forming organs and certain disorders involving the immune mechanism: Secondary | ICD-10-CM | POA: Insufficient documentation

## 2014-02-10 DIAGNOSIS — E119 Type 2 diabetes mellitus without complications: Secondary | ICD-10-CM | POA: Diagnosis not present

## 2014-02-10 DIAGNOSIS — Y9301 Activity, walking, marching and hiking: Secondary | ICD-10-CM | POA: Insufficient documentation

## 2014-02-10 DIAGNOSIS — S0003XA Contusion of scalp, initial encounter: Secondary | ICD-10-CM | POA: Insufficient documentation

## 2014-02-10 DIAGNOSIS — Z79899 Other long term (current) drug therapy: Secondary | ICD-10-CM | POA: Insufficient documentation

## 2014-02-10 LAB — CBC WITH DIFFERENTIAL/PLATELET
BASOS ABS: 0 10*3/uL (ref 0.0–0.1)
BASOS PCT: 0 % (ref 0–1)
Eosinophils Absolute: 0.3 10*3/uL (ref 0.0–0.7)
Eosinophils Relative: 12 % — ABNORMAL HIGH (ref 0–5)
HCT: 27.1 % — ABNORMAL LOW (ref 39.0–52.0)
Hemoglobin: 9.2 g/dL — ABNORMAL LOW (ref 13.0–17.0)
Lymphocytes Relative: 27 % (ref 12–46)
Lymphs Abs: 0.7 10*3/uL (ref 0.7–4.0)
MCH: 30.9 pg (ref 26.0–34.0)
MCHC: 33.9 g/dL (ref 30.0–36.0)
MCV: 90.9 fL (ref 78.0–100.0)
Monocytes Absolute: 0.3 10*3/uL (ref 0.1–1.0)
Monocytes Relative: 9 % (ref 3–12)
NEUTROS PCT: 52 % (ref 43–77)
Neutro Abs: 1.4 10*3/uL — ABNORMAL LOW (ref 1.7–7.7)
PLATELETS: 46 10*3/uL — AB (ref 150–400)
RBC: 2.98 MIL/uL — ABNORMAL LOW (ref 4.22–5.81)
RDW: 17.9 % — AB (ref 11.5–15.5)
WBC: 2.7 10*3/uL — ABNORMAL LOW (ref 4.0–10.5)

## 2014-02-10 LAB — COMPREHENSIVE METABOLIC PANEL
ALBUMIN: 2.8 g/dL — AB (ref 3.5–5.2)
ALK PHOS: 98 U/L (ref 39–117)
ALT: 19 U/L (ref 0–53)
AST: 50 U/L — ABNORMAL HIGH (ref 0–37)
Anion gap: 12 (ref 5–15)
BILIRUBIN TOTAL: 2.1 mg/dL — AB (ref 0.3–1.2)
BUN: 27 mg/dL — ABNORMAL HIGH (ref 6–23)
CHLORIDE: 107 meq/L (ref 96–112)
CO2: 23 mEq/L (ref 19–32)
Calcium: 8.4 mg/dL (ref 8.4–10.5)
Creatinine, Ser: 1.59 mg/dL — ABNORMAL HIGH (ref 0.50–1.35)
GFR calc Af Amer: 54 mL/min — ABNORMAL LOW (ref >90)
GFR calc non Af Amer: 46 mL/min — ABNORMAL LOW (ref >90)
Glucose, Bld: 145 mg/dL — ABNORMAL HIGH (ref 70–99)
POTASSIUM: 4.1 meq/L (ref 3.7–5.3)
SODIUM: 142 meq/L (ref 137–147)
Total Protein: 6.2 g/dL (ref 6.0–8.3)

## 2014-02-10 LAB — URINALYSIS, ROUTINE W REFLEX MICROSCOPIC
Bilirubin Urine: NEGATIVE
Glucose, UA: 250 mg/dL — AB
Hgb urine dipstick: NEGATIVE
KETONES UR: NEGATIVE mg/dL
LEUKOCYTES UA: NEGATIVE
Nitrite: NEGATIVE
Protein, ur: NEGATIVE mg/dL
Specific Gravity, Urine: 1.018 (ref 1.005–1.030)
Urobilinogen, UA: 1 mg/dL (ref 0.0–1.0)
pH: 5.5 (ref 5.0–8.0)

## 2014-02-10 LAB — AMMONIA: Ammonia: 38 umol/L (ref 11–60)

## 2014-02-10 LAB — PRO B NATRIURETIC PEPTIDE: PRO B NATRI PEPTIDE: 805.5 pg/mL — AB (ref 0–125)

## 2014-02-10 NOTE — ED Provider Notes (Signed)
CSN: [id number]     Arrival date & time 02/10/14  1514 History   First MD Initiated Contact with Patient 02/10/14 1937     Chief Complaint  Patient presents with  . Fall     (Consider location/radiation/quality/duration/timing/severity/associated sxs/prior Treatment) HPI Comments: Patient is a 58 year old male past medical history of hypertension, diabetes, stroke, history of alcohol and cocaine abuse, COPD, hepatic encephalopathy presents with complaints of unwitnessed fall last night. The patient reports a mechanical fall while walking to the bathroom. He denies loss of consciousness. He reports he laid on the floor and was able to get up into bed last night. He reports persistent left-sided head ache. He denies change in vision from baseline. The patient reports increase lower tremor knee edema, with intermittent shortness of breath. Denies alcohol use.  The history is provided by the patient. No language interpreter was used.    Past Medical History  Diagnosis Date  . Neuropathy   . Diabetes mellitus   . Bipolar affect, depressed   . Hypertension   . Arthritis   . Stroke     Mini stroke about 61yrs ago  . Cirrhosis   . Alcohol abuse   . Chronic pain   . Cocaine abuse   . Muscle spasm     both legs  . Encephalopathy, hepatic   . Detached retina   . COPD (chronic obstructive pulmonary disease)     emphysema  . Bronchitis   . Barrett's esophagus   . GERD (gastroesophageal reflux disease)     has ulcer  . Anemia    Past Surgical History  Procedure Laterality Date  . Fracture surgery      Leg and arm 49yrs ago  . Esophagogastroduodenoscopy  04/04/2012    Procedure: ESOPHAGOGASTRODUODENOSCOPY (EGD);  Surgeon: Irene Shipper, MD;  [facility] ENDOSCOPY;  Service: Endoscopy;  Laterality: N/A;  . Esophagogastroduodenoscopy Left 03/13/2013    Procedure: ESOPHAGOGASTRODUODENOSCOPY (EGD);  Surgeon: Arta Silence, MD;  Location: [facility] ENDOSCOPY;  Service: Endoscopy;  Laterality:  Left;  Marland Kitchen Eye surgery  8 months ago both eyes    cataracts both eyes, detached eye, gas pocket  . Vasectomy    . Pars plana vitrectomy Left 07/08/2013    Procedure: PARS PLANA VITRECTOMY WITH 25 GAUGE;  Surgeon: Hurman Horn, MD;  Location: [place];  Service: Ophthalmology;  Laterality: Left;  . Intraocular lens removal Left 07/08/2013    Procedure: REMOVAL OF INTRAOCULAR LENS;  Surgeon: Hurman Horn, MD;  Location: [place];  Service: Ophthalmology;  Laterality: Left;  . Placement and suture of secondary intraocular lens Left 07/08/2013    Procedure: PLACEMENT AND SUTURE OF SECONDARY INTRAOCULAR LENS;  Surgeon: Hurman Horn, MD;  Location: [place];  Service: Ophthalmology;  Laterality: Left;  Insertion of Anterior Capsule Intraocular Lens   . Esophagogastroduodenoscopy N/A 01/16/2014    Procedure: ESOPHAGOGASTRODUODENOSCOPY (EGD);  Surgeon: Winfield Cunas., MD;  Location: [facility] ENDOSCOPY;  Service: Endoscopy;  Laterality: N/A;  . Colonoscopy N/A 01/17/2014    Procedure: COLONOSCOPY;  Surgeon: Winfield Cunas., MD;  Location: [facility] ENDOSCOPY;  Service: Endoscopy;  Laterality: N/A;  possible banding   Family History  Problem Relation Age of Onset  . Hypotension Mother    History  Substance Use Topics  . Smoking status: Current Every Day Smoker -- 1.00 packs/day for 30 years    Types: Cigarettes    Last Attempt to Quit: 04/06/2012  . Smokeless tobacco: Never Used  Comment: quit   . Alcohol Use: 0.0 oz/week     Comment: 12 pk beer daily  06/2013 - no alcohol since 11/2012    Review of Systems  Constitutional: Negative for fever and chills.  Respiratory: Positive for shortness of breath. Negative for cough.   Cardiovascular: Positive for leg swelling. Negative for chest pain.  Neurological: Positive for weakness (Chronic left hand) and headaches. Negative for syncope and light-headedness.      Allergies  Review of patient's allergies indicates no known allergies.  Home Medications    Prior to Admission medications   Medication Sig Start Date End Date Taking? Authorizing Provider  ALPRAZolam Duanne Moron) 0.5 MG tablet Take 2 tablets (1 mg total) by mouth at bedtime as needed for anxiety. 01/29/14   Thurnell Lose, MD  amitriptyline (ELAVIL) 25 MG tablet Take 25 mg by mouth at bedtime as needed for sleep.     Historical Provider, MD  diclofenac sodium (VOLTAREN) 1 % GEL Apply 1 application topically 2 (two) times daily as needed (pain in shoulders and back.).     Historical Provider, MD  furosemide (LASIX) 20 MG tablet Take 1 tablet (20 mg total) by mouth daily. 01/29/14   Thurnell Lose, MD  ibuprofen (ADVIL,MOTRIN) 200 MG tablet Take 200 mg by mouth every 6 (six) hours as needed for moderate pain.    Historical Provider, MD  insulin aspart (NOVOLOG FLEXPEN) 100 UNIT/ML FlexPen Inject 6 Units into the skin 3 (three) times daily with meals.    Historical Provider, MD  Insulin Glargine (LANTUS SOLOSTAR) 100 UNIT/ML Solostar Pen Inject 45 Units into the skin at bedtime. 11/17/13   Malvin Johns, MD  lactulose (CHRONULAC) 10 GM/15ML solution Take 45 mLs (30 g total) by mouth 3 (three) times daily. Titrate to at least 3 BMs daily 01/29/14   Thurnell Lose, MD  Oxycodone HCl 10 MG TABS Take 10 mg by mouth 3 (three) times daily. scheduled    Historical Provider, MD  pantoprazole (PROTONIX) 40 MG tablet Take 40 mg by mouth at bedtime.     Historical Provider, MD  propranolol (INDERAL) 20 MG tablet Take 20 mg by mouth daily.     Historical Provider, MD  rifaximin (XIFAXAN) 550 MG TABS tablet Take 1 tablet (550 mg total) by mouth 2 (two) times daily. 01/29/14   Thurnell Lose, MD  spironolactone (ALDACTONE) 50 MG tablet Take 1 tablet (50 mg total) by mouth daily. 01/29/14   Thurnell Lose, MD   BP 128/69  Pulse 67  Temp(Src) 98.4 F (36.9 C) (Oral)  Resp 18  SpO2 100% Physical Exam  Nursing note and vitals reviewed. Constitutional: He appears well-developed and well-nourished.  HENT:   Head: Normocephalic. Head is with contusion. Head is without raccoon's eyes and without Battle's sign.    Right Ear: No hemotympanum.  Left Ear: No hemotympanum.  Ecchymosis to left forehead.  Eyes: Conjunctivae are normal. Pupils are equal, round, and reactive to light.  Cardiovascular: Normal rate and regular rhythm.   Pitting edema +2 bilaterally.  Pulmonary/Chest: Effort normal and breath sounds normal. He has no wheezes. He has no rales.  Abdominal: Soft. There is no tenderness. There is no rebound.  Musculoskeletal:       Left hand: He exhibits tenderness and swelling. He exhibits normal range of motion.  Left hand with ecchymosis to dorsal surface, no obvious deformity. Sensation and good cap refill. No anatomic snuffbox tenderness. Left wrist with mild tenderness to palpation, no  obvious deformity, mild swelling.  Neurological: He is alert. He is disoriented. GCS eye subscore is 4. GCS verbal subscore is 5. GCS motor subscore is 6.  Oriented to person, place, disoriented to time. Speech is clear and goal oriented, follows commands Cranial nerves III - XII grossly intact, no facial droop Left upper extremity weakness with decreased grip strength and pronator drift. Normal strength in lower extremities bilaterally Sensation normal to light touch     Skin: Skin is warm. He is not diaphoretic.  Psychiatric: He has a normal mood and affect.    ED Course  Procedures (including critical care time) Labs Review Labs Reviewed  CBC WITH DIFFERENTIAL - Abnormal; Notable for the following:    WBC 2.7 (*)    RBC 2.98 (*)    Hemoglobin 9.2 (*)    HCT 27.1 (*)    RDW 17.9 (*)    Platelets 46 (*)    Neutro Abs 1.4 (*)    Eosinophils Relative 12 (*)    All other components within normal limits  COMPREHENSIVE METABOLIC PANEL - Abnormal; Notable for the following:    Glucose, Bld 145 (*)    BUN 27 (*)    Creatinine, Ser 1.59 (*)    Albumin 2.8 (*)    AST 50 (*)    Total  Bilirubin 2.1 (*)    GFR calc non Af Amer 46 (*)    GFR calc Af Amer 54 (*)    All other components within normal limits  URINALYSIS, ROUTINE W REFLEX MICROSCOPIC - Abnormal; Notable for the following:    Color, Urine AMBER (*)    Glucose, UA 250 (*)    All other components within normal limits  PRO B NATRIURETIC PEPTIDE - Abnormal; Notable for the following:    Pro B Natriuretic peptide (BNP) 805.5 (*)    All other components within normal limits  AMMONIA    Imaging Review Dg Chest 2 View  02/10/2014   CLINICAL DATA:  Pain post trauma  EXAM: CHEST  2 VIEW  COMPARISON:  February 04, 2014  FINDINGS: There is no edema or consolidation. The mild interstitial thickening noted in the bases, slightly more on the right than on the left, remains stable. The heart size and pulmonary vascularity are normal. No adenopathy. No pneumothorax. No bone lesions.  IMPRESSION: No edema or consolidation. Areas of mild interstitial thickening are stable.   Electronically Signed   By: Lowella Grip M.D.   On: 02/10/2014 20:55   Dg Wrist Complete Left  02/10/2014   CLINICAL DATA:  Fall, left wrist pain  EXAM: LEFT WRIST - COMPLETE 3+ VIEW  COMPARISON:  None.  FINDINGS: No fracture or dislocation is seen.  Degenerative changes of the 1st carpometacarpal joint and radiocarpal reticulation.  The visualized soft tissues are unremarkable.  IMPRESSION: No fracture or dislocation is seen.   Electronically Signed   By: Julian Hy M.D.   On: 02/10/2014 20:53   Ct Head Wo Contrast  02/10/2014   CLINICAL DATA:  Pain post trauma  EXAM: CT HEAD WITHOUT CONTRAST  TECHNIQUE: Contiguous axial images were obtained from the base of the skull through the vertex without intravenous contrast.  COMPARISON:  February 08, 2014  FINDINGS: The ventricles are normal in size and configuration. There is no appreciable mass, hemorrhage, extra-axial fluid collection, or midline shift. There is minimal periventricular small vessel  disease in the centra semiovale bilaterally. Elsewhere gray-white compartments appear normal. No demonstrable acute infarct. Bony calvarium appears  intact. There is mild opacification of inferior mastoids on the left, stable. Mastoids elsewhere bilaterally are clear.  IMPRESSION: Minimal periventricular small vessel disease. No intracranial mass, hemorrhage, or extra-axial fluid. Mild chronic thickening of the inferior mastoid air cells on the left. Mastoids elsewhere appear clear.   Electronically Signed   By: Lowella Grip M.D.   On: 02/10/2014 20:31   Dg Hand Complete Left  02/10/2014   CLINICAL DATA:  Fall with left wrist pain.  Subsequent encounter  EXAM: LEFT HAND - COMPLETE 3+ VIEW  COMPARISON:  02/08/2014  FINDINGS: Dorsal hand soft tissue swelling. No evidence of acute fracture or dislocation.  Unchanged metallic 3 mm foreign body in the soft tissues ventral and lateral to the wrist.  Osteopenia. There is degenerative narrowing and osteophytosis of the first [facility] joint with radial subluxation. Mild diffuse interphalangeal joint narrowing.  IMPRESSION: 1. No acute osseous findings. 2. Osteopenia.   Electronically Signed   By: Jorje Guild M.D.   On: 02/10/2014 20:58     EKG Interpretation None      MDM   Final diagnoses:  Head contusion, initial encounter  Wrist contusion, left, initial encounter  Fall against object, initial encounter   Patient presents after fall reports mechanical unwitnessed. Denies loss of consciousness, blow crit yesterday. Patient has moderate ecchymosis to the left side of face. No new neurologic deficits. CT negative for acute findings, labs at baseline, ammonia 38, negative. Chest x-ray without signs of volume overload, BNP 805, down from 966 3 days ago. Left wrist and hand negative for acute findings, will splint and followup with or so as needed.    Harvie Heck, PA-C 02/11/14 [phone]

## 2014-02-10 NOTE — ED Notes (Signed)
Per EMS: pt from home c/o fall yesterday hitting head; pt sts hx of similar when elevated ammonia levels

## 2014-02-10 NOTE — Discharge Instructions (Signed)
Call for a follow up appointment with a Family or Primary Care Provider.  Follow up with an orthopedic specialist as needed. Ice your wrist 3-4 times a day. Return if symptoms worsen.   Take medication as prescribed.

## 2014-02-12 ENCOUNTER — Emergency Department (HOSPITAL_COMMUNITY)
Admission: EM | Admit: 2014-02-12 | Discharge: 2014-02-12 | Disposition: A | Discharge status: Home / Self Care | Payer: Medicare PPO | Attending: Emergency Medicine | Admitting: Emergency Medicine

## 2014-02-12 ENCOUNTER — Encounter (HOSPITAL_COMMUNITY): Payer: Self-pay | Admitting: Emergency Medicine

## 2014-02-12 DIAGNOSIS — Z8669 Personal history of other diseases of the nervous system and sense organs: Secondary | ICD-10-CM | POA: Insufficient documentation

## 2014-02-12 DIAGNOSIS — Z862 Personal history of diseases of the blood and blood-forming organs and certain disorders involving the immune mechanism: Secondary | ICD-10-CM | POA: Insufficient documentation

## 2014-02-12 DIAGNOSIS — E119 Type 2 diabetes mellitus without complications: Secondary | ICD-10-CM | POA: Diagnosis not present

## 2014-02-12 DIAGNOSIS — G8929 Other chronic pain: Secondary | ICD-10-CM | POA: Insufficient documentation

## 2014-02-12 DIAGNOSIS — F17200 Nicotine dependence, unspecified, uncomplicated: Secondary | ICD-10-CM | POA: Diagnosis not present

## 2014-02-12 DIAGNOSIS — Z79899 Other long term (current) drug therapy: Secondary | ICD-10-CM | POA: Insufficient documentation

## 2014-02-12 DIAGNOSIS — Z791 Long term (current) use of non-steroidal anti-inflammatories (NSAID): Secondary | ICD-10-CM | POA: Diagnosis not present

## 2014-02-12 DIAGNOSIS — Z8659 Personal history of other mental and behavioral disorders: Secondary | ICD-10-CM | POA: Diagnosis not present

## 2014-02-12 DIAGNOSIS — Z792 Long term (current) use of antibiotics: Secondary | ICD-10-CM | POA: Insufficient documentation

## 2014-02-12 DIAGNOSIS — I10 Essential (primary) hypertension: Secondary | ICD-10-CM | POA: Insufficient documentation

## 2014-02-12 DIAGNOSIS — Z8673 Personal history of transient ischemic attack (TIA), and cerebral infarction without residual deficits: Secondary | ICD-10-CM | POA: Insufficient documentation

## 2014-02-12 DIAGNOSIS — R609 Edema, unspecified: Secondary | ICD-10-CM | POA: Diagnosis not present

## 2014-02-12 DIAGNOSIS — J45909 Unspecified asthma, uncomplicated: Secondary | ICD-10-CM | POA: Insufficient documentation

## 2014-02-12 DIAGNOSIS — Z794 Long term (current) use of insulin: Secondary | ICD-10-CM | POA: Diagnosis not present

## 2014-02-12 DIAGNOSIS — M79609 Pain in unspecified limb: Secondary | ICD-10-CM | POA: Diagnosis present

## 2014-02-12 DIAGNOSIS — M129 Arthropathy, unspecified: Secondary | ICD-10-CM | POA: Insufficient documentation

## 2014-02-12 DIAGNOSIS — Z760 Encounter for issue of repeat prescription: Secondary | ICD-10-CM | POA: Diagnosis not present

## 2014-02-12 DIAGNOSIS — Z8719 Personal history of other diseases of the digestive system: Secondary | ICD-10-CM | POA: Insufficient documentation

## 2014-02-12 MED ORDER — AMITRIPTYLINE HCL 25 MG PO TABS: 25.0000 mg | ORAL_TABLET | Freq: Every evening | ORAL | Status: DC | PRN

## 2014-02-12 MED ORDER — ALPRAZOLAM 0.25 MG PO TABS
0.5000 mg | ORAL_TABLET | Freq: Once | ORAL | Status: AC
  Administered 2014-02-12: 0.5 mg via ORAL
  Filled 2014-02-12: qty 2

## 2014-02-12 MED ORDER — PROPRANOLOL HCL 20 MG PO TABS: 20.0000 mg | ORAL_TABLET | Freq: Every day | ORAL | Status: DC

## 2014-02-12 MED ORDER — RIFAXIMIN 550 MG PO TABS: 550.0000 mg | ORAL_TABLET | Freq: Two times a day (BID) | ORAL | Status: DC

## 2014-02-12 MED ORDER — SPIRONOLACTONE 50 MG PO TABS: 50.0000 mg | ORAL_TABLET | Freq: Every day | ORAL | Status: DC

## 2014-02-12 MED ORDER — OXYCODONE HCL 5 MG PO TABS
10.0000 mg | ORAL_TABLET | Freq: Once | ORAL | Status: AC
  Administered 2014-02-12: 10 mg via ORAL
  Filled 2014-02-12: qty 2

## 2014-02-12 NOTE — Discharge Instructions (Signed)
Read the information below.  Use the prescribed medication as directed.  Please discuss all new medications with your pharmacist.  You may return to the Emergency Department at any time for worsening condition or any new symptoms that concern you.    °

## 2014-02-12 NOTE — ED Notes (Signed)
Pt states he ran out of meds and that his daughter was also stealing his meds. Pt states "my daughter was stealing my metformin so I switched them out with my lortab and so she was stealing those instead." Pt reports legs are swollen as they normally are, but feet are more swollen. Also reports severe itching to legs and feet. Pt reports [employer] is going to have someone come to his house to help him with Md appointments. Pt states he is just out of his meds which is why it has gotten worse.

## 2014-02-12 NOTE — ED Notes (Signed)
Pt reassessed for depression/SI. Denies any thoughts or feelings of depression/SI.
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
## 2014-02-12 NOTE — ED Notes (Signed)
Pt reports having bilateral foot pain due to DM and neuropathy. Denies any wounds to his feet. No acute distress noted at triage.

## 2014-02-13 ENCOUNTER — Emergency Department (HOSPITAL_COMMUNITY)
Admission: EM | Admit: 2014-02-13 | Discharge: 2014-02-14 | Disposition: A | Discharge status: Other Acute Inpatient Hospital | Payer: Medicare PPO | Source: Home / Self Care | Attending: Emergency Medicine | Admitting: Emergency Medicine

## 2014-02-13 ENCOUNTER — Encounter (HOSPITAL_COMMUNITY): Payer: Self-pay | Admitting: Emergency Medicine

## 2014-02-13 DIAGNOSIS — F17200 Nicotine dependence, unspecified, uncomplicated: Secondary | ICD-10-CM | POA: Insufficient documentation

## 2014-02-13 DIAGNOSIS — R45851 Suicidal ideations: Secondary | ICD-10-CM | POA: Insufficient documentation

## 2014-02-13 DIAGNOSIS — Z79899 Other long term (current) drug therapy: Secondary | ICD-10-CM

## 2014-02-13 DIAGNOSIS — G8929 Other chronic pain: Secondary | ICD-10-CM

## 2014-02-13 DIAGNOSIS — F1310 Sedative, hypnotic or anxiolytic abuse, uncomplicated: Secondary | ICD-10-CM

## 2014-02-13 DIAGNOSIS — Z8719 Personal history of other diseases of the digestive system: Secondary | ICD-10-CM

## 2014-02-13 DIAGNOSIS — I10 Essential (primary) hypertension: Secondary | ICD-10-CM

## 2014-02-13 DIAGNOSIS — Z8673 Personal history of transient ischemic attack (TIA), and cerebral infarction without residual deficits: Secondary | ICD-10-CM | POA: Insufficient documentation

## 2014-02-13 DIAGNOSIS — M129 Arthropathy, unspecified: Secondary | ICD-10-CM | POA: Insufficient documentation

## 2014-02-13 DIAGNOSIS — J438 Other emphysema: Secondary | ICD-10-CM | POA: Insufficient documentation

## 2014-02-13 DIAGNOSIS — Z792 Long term (current) use of antibiotics: Secondary | ICD-10-CM | POA: Insufficient documentation

## 2014-02-13 DIAGNOSIS — Z794 Long term (current) use of insulin: Secondary | ICD-10-CM

## 2014-02-13 DIAGNOSIS — F3130 Bipolar disorder, current episode depressed, mild or moderate severity, unspecified: Secondary | ICD-10-CM | POA: Insufficient documentation

## 2014-02-13 DIAGNOSIS — Z862 Personal history of diseases of the blood and blood-forming organs and certain disorders involving the immune mechanism: Secondary | ICD-10-CM

## 2014-02-13 DIAGNOSIS — F332 Major depressive disorder, recurrent severe without psychotic features: Secondary | ICD-10-CM | POA: Diagnosis not present

## 2014-02-13 DIAGNOSIS — E119 Type 2 diabetes mellitus without complications: Secondary | ICD-10-CM | POA: Insufficient documentation

## 2014-02-13 DIAGNOSIS — F32A Depression, unspecified: Secondary | ICD-10-CM

## 2014-02-13 DIAGNOSIS — F329 Major depressive disorder, single episode, unspecified: Secondary | ICD-10-CM

## 2014-02-13 LAB — CBC WITH DIFFERENTIAL/PLATELET
BASOS PCT: 0 % (ref 0–1)
Basophils Absolute: 0 10*3/uL (ref 0.0–0.1)
Eosinophils Absolute: 0.3 10*3/uL (ref 0.0–0.7)
Eosinophils Relative: 11 % — ABNORMAL HIGH (ref 0–5)
HCT: 26.4 % — ABNORMAL LOW (ref 39.0–52.0)
HEMOGLOBIN: 9 g/dL — AB (ref 13.0–17.0)
Lymphocytes Relative: 27 % (ref 12–46)
Lymphs Abs: 0.7 10*3/uL (ref 0.7–4.0)
MCH: 31 pg (ref 26.0–34.0)
MCHC: 34.1 g/dL (ref 30.0–36.0)
MCV: 91 fL (ref 78.0–100.0)
Monocytes Absolute: 0.3 10*3/uL (ref 0.1–1.0)
Monocytes Relative: 13 % — ABNORMAL HIGH (ref 3–12)
NEUTROS PCT: 48 % (ref 43–77)
Neutro Abs: 1.2 10*3/uL — ABNORMAL LOW (ref 1.7–7.7)
PLATELETS: 43 10*3/uL — AB (ref 150–400)
RBC: 2.9 MIL/uL — ABNORMAL LOW (ref 4.22–5.81)
RDW: 17.6 % — ABNORMAL HIGH (ref 11.5–15.5)
WBC: 2.5 10*3/uL — ABNORMAL LOW (ref 4.0–10.5)

## 2014-02-13 LAB — COMPREHENSIVE METABOLIC PANEL
ALT: 18 U/L (ref 0–53)
AST: 46 U/L — ABNORMAL HIGH (ref 0–37)
Albumin: 2.7 g/dL — ABNORMAL LOW (ref 3.5–5.2)
Alkaline Phosphatase: 91 U/L (ref 39–117)
Anion gap: 9 (ref 5–15)
BUN: 11 mg/dL (ref 6–23)
CALCIUM: 8.3 mg/dL — AB (ref 8.4–10.5)
CHLORIDE: 106 meq/L (ref 96–112)
CO2: 22 mEq/L (ref 19–32)
CREATININE: 0.89 mg/dL (ref 0.50–1.35)
GFR calc non Af Amer: >90 mL/min (ref >90)
Glucose, Bld: 176 mg/dL — ABNORMAL HIGH (ref 70–99)
POTASSIUM: 4.1 meq/L (ref 3.7–5.3)
SODIUM: 137 meq/L (ref 137–147)
Total Bilirubin: 1.9 mg/dL — ABNORMAL HIGH (ref 0.3–1.2)
Total Protein: 5.9 g/dL — ABNORMAL LOW (ref 6.0–8.3)

## 2014-02-13 LAB — ACETAMINOPHEN LEVEL: Acetaminophen (Tylenol), Serum: <15 ug/mL (ref 10–30)

## 2014-02-13 LAB — SALICYLATE LEVEL: Salicylate Lvl: <2 mg/dL — ABNORMAL LOW (ref 2.8–20.0)

## 2014-02-13 LAB — ETHANOL: Alcohol, Ethyl (B): <11 mg/dL (ref 0–11)

## 2014-02-13 MED ORDER — LORAZEPAM 1 MG PO TABS: 1.0000 mg | ORAL_TABLET | Freq: Three times a day (TID) | ORAL | Status: DC | PRN

## 2014-02-13 MED ORDER — INSULIN ASPART 100 UNIT/ML ~~LOC~~ SOLN: 0.0000 [IU] | Freq: Three times a day (TID) | SUBCUTANEOUS | Status: DC

## 2014-02-13 MED ORDER — ALPRAZOLAM 0.5 MG PO TABS: 0.5000 mg | ORAL_TABLET | Freq: Three times a day (TID) | ORAL | Status: DC | PRN

## 2014-02-13 MED ORDER — RIFAXIMIN 550 MG PO TABS
550.0000 mg | ORAL_TABLET | Freq: Two times a day (BID) | ORAL | Status: DC
  Administered 2014-02-14 (×2): 550 mg via ORAL
  Filled 2014-02-13 (×3): qty 1

## 2014-02-13 MED ORDER — ACETAMINOPHEN 325 MG PO TABS: 650.0000 mg | ORAL_TABLET | ORAL | Status: DC | PRN

## 2014-02-13 MED ORDER — INSULIN ASPART 100 UNIT/ML ~~LOC~~ SOLN: 6.0000 [IU] | Freq: Three times a day (TID) | SUBCUTANEOUS | Status: DC

## 2014-02-13 MED ORDER — INSULIN GLARGINE 100 UNIT/ML ~~LOC~~ SOLN
45.0000 [IU] | Freq: Every day | SUBCUTANEOUS | Status: DC
  Filled 2014-02-13 (×2): qty 0.45

## 2014-02-13 MED ORDER — IBUPROFEN 400 MG PO TABS: 600.0000 mg | ORAL_TABLET | Freq: Three times a day (TID) | ORAL | Status: DC | PRN

## 2014-02-13 MED ORDER — AMITRIPTYLINE HCL 25 MG PO TABS: 25.0000 mg | ORAL_TABLET | Freq: Every evening | ORAL | Status: DC | PRN

## 2014-02-13 MED ORDER — PROPRANOLOL HCL 20 MG PO TABS
20.0000 mg | ORAL_TABLET | Freq: Every day | ORAL | Status: DC
  Administered 2014-02-14: 20 mg via ORAL
  Filled 2014-02-13: qty 1

## 2014-02-13 MED ORDER — SPIRONOLACTONE 50 MG PO TABS
50.0000 mg | ORAL_TABLET | Freq: Every day | ORAL | Status: DC
  Administered 2014-02-14: 50 mg via ORAL
  Filled 2014-02-13: qty 1

## 2014-02-13 MED ORDER — LACTULOSE 10 GM/15ML PO SOLN
30.0000 g | Freq: Three times a day (TID) | ORAL | Status: DC
  Administered 2014-02-14 (×2): 30 g via ORAL
  Filled 2014-02-13 (×4): qty 45

## 2014-02-13 MED ORDER — INSULIN ASPART 100 UNIT/ML ~~LOC~~ SOLN: 0.0000 [IU] | Freq: Every day | SUBCUTANEOUS | Status: DC

## 2014-02-13 MED ORDER — OXYCODONE HCL 5 MG PO TABS
10.0000 mg | ORAL_TABLET | Freq: Three times a day (TID) | ORAL | Status: DC
  Administered 2014-02-14 (×2): 10 mg via ORAL
  Filled 2014-02-13 (×2): qty 2
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
## 2014-02-13 NOTE — ED Notes (Signed)
Per EMS, pt coming in tonight due to increased stress from family issues. Pt reports SI ideations at this time.

## 2014-02-13 NOTE — ED Provider Notes (Signed)
Medical screening examination/treatment/procedure(s) were performed by non-physician practitioner and as supervising physician I was immediately available for consultation/collaboration.   EKG Interpretation None        Wandra Arthurs, MD 02/13/14 1048

## 2014-02-13 NOTE — ED Provider Notes (Signed)
Medical screening examination/treatment/procedure(s) were performed by non-physician practitioner and as supervising physician I was immediately available for consultation/collaboration.   EKG Interpretation   Date/Time:  Monday February 10 2014 20:36:28 EDT Ventricular Rate:  67 PR Interval:  59 QRS Duration: 91 QT Interval:  439 QTC Calculation: 463 R Axis:   42 Text Interpretation:  Sinus rhythm Nonspecific ST and T wave abnormality  No significant change since last tracing Confirmed by Betsey Holiday  MD,  CHRISTOPHER [phone]) on 02/10/2014 9:16:38 PM        Orpah Greek, MD 02/13/14 [phone]

## 2014-02-14 ENCOUNTER — Inpatient Hospital Stay (HOSPITAL_COMMUNITY)
Admission: AD | Admit: 2014-02-14 | Discharge: 2014-02-19 | DRG: 885 | Disposition: A | Discharge status: Home / Self Care | Payer: Medicare PPO | Source: Intra-hospital | Attending: Psychiatry | Admitting: Psychiatry

## 2014-02-14 ENCOUNTER — Encounter (HOSPITAL_COMMUNITY): Payer: Self-pay | Admitting: *Deleted

## 2014-02-14 DIAGNOSIS — F411 Generalized anxiety disorder: Secondary | ICD-10-CM | POA: Diagnosis present

## 2014-02-14 DIAGNOSIS — G8929 Other chronic pain: Secondary | ICD-10-CM | POA: Diagnosis present

## 2014-02-14 DIAGNOSIS — G4700 Insomnia, unspecified: Secondary | ICD-10-CM | POA: Diagnosis present

## 2014-02-14 DIAGNOSIS — F1020 Alcohol dependence, uncomplicated: Secondary | ICD-10-CM | POA: Diagnosis present

## 2014-02-14 DIAGNOSIS — F17200 Nicotine dependence, unspecified, uncomplicated: Secondary | ICD-10-CM | POA: Diagnosis present

## 2014-02-14 DIAGNOSIS — F1010 Alcohol abuse, uncomplicated: Secondary | ICD-10-CM

## 2014-02-14 DIAGNOSIS — I10 Essential (primary) hypertension: Secondary | ICD-10-CM | POA: Diagnosis present

## 2014-02-14 DIAGNOSIS — R609 Edema, unspecified: Secondary | ICD-10-CM | POA: Diagnosis present

## 2014-02-14 DIAGNOSIS — K219 Gastro-esophageal reflux disease without esophagitis: Secondary | ICD-10-CM | POA: Diagnosis present

## 2014-02-14 DIAGNOSIS — F1410 Cocaine abuse, uncomplicated: Secondary | ICD-10-CM | POA: Diagnosis present

## 2014-02-14 DIAGNOSIS — F332 Major depressive disorder, recurrent severe without psychotic features: Principal | ICD-10-CM | POA: Diagnosis present

## 2014-02-14 DIAGNOSIS — F319 Bipolar disorder, unspecified: Secondary | ICD-10-CM | POA: Diagnosis present

## 2014-02-14 DIAGNOSIS — E119 Type 2 diabetes mellitus without complications: Secondary | ICD-10-CM | POA: Diagnosis present

## 2014-02-14 DIAGNOSIS — D61818 Other pancytopenia: Secondary | ICD-10-CM | POA: Diagnosis present

## 2014-02-14 DIAGNOSIS — G5790 Unspecified mononeuropathy of unspecified lower limb: Secondary | ICD-10-CM | POA: Diagnosis present

## 2014-02-14 DIAGNOSIS — K7030 Alcoholic cirrhosis of liver without ascites: Secondary | ICD-10-CM | POA: Diagnosis present

## 2014-02-14 DIAGNOSIS — Z5989 Other problems related to housing and economic circumstances: Secondary | ICD-10-CM | POA: Diagnosis not present

## 2014-02-14 DIAGNOSIS — Z79899 Other long term (current) drug therapy: Secondary | ICD-10-CM | POA: Diagnosis not present

## 2014-02-14 DIAGNOSIS — K589 Irritable bowel syndrome without diarrhea: Secondary | ICD-10-CM | POA: Diagnosis present

## 2014-02-14 DIAGNOSIS — R45851 Suicidal ideations: Secondary | ICD-10-CM

## 2014-02-14 DIAGNOSIS — F329 Major depressive disorder, single episode, unspecified: Secondary | ICD-10-CM | POA: Diagnosis present

## 2014-02-14 DIAGNOSIS — Z609 Problem related to social environment, unspecified: Secondary | ICD-10-CM

## 2014-02-14 DIAGNOSIS — M129 Arthropathy, unspecified: Secondary | ICD-10-CM | POA: Diagnosis present

## 2014-02-14 DIAGNOSIS — Z5987 Material hardship due to limited financial resources, not elsewhere classified: Secondary | ICD-10-CM

## 2014-02-14 DIAGNOSIS — Z598 Other problems related to housing and economic circumstances: Secondary | ICD-10-CM | POA: Diagnosis not present

## 2014-02-14 DIAGNOSIS — Z8673 Personal history of transient ischemic attack (TIA), and cerebral infarction without residual deficits: Secondary | ICD-10-CM | POA: Diagnosis not present

## 2014-02-14 DIAGNOSIS — Z794 Long term (current) use of insulin: Secondary | ICD-10-CM

## 2014-02-14 DIAGNOSIS — E118 Type 2 diabetes mellitus with unspecified complications: Secondary | ICD-10-CM | POA: Diagnosis present

## 2014-02-14 DIAGNOSIS — E8809 Other disorders of plasma-protein metabolism, not elsewhere classified: Secondary | ICD-10-CM | POA: Diagnosis present

## 2014-02-14 DIAGNOSIS — F3130 Bipolar disorder, current episode depressed, mild or moderate severity, unspecified: Secondary | ICD-10-CM

## 2014-02-14 DIAGNOSIS — K7460 Unspecified cirrhosis of liver: Secondary | ICD-10-CM | POA: Diagnosis present

## 2014-02-14 DIAGNOSIS — Z559 Problems related to education and literacy, unspecified: Secondary | ICD-10-CM

## 2014-02-14 DIAGNOSIS — D649 Anemia, unspecified: Secondary | ICD-10-CM | POA: Diagnosis present

## 2014-02-14 LAB — GLUCOSE, CAPILLARY
Glucose-Capillary: 145 mg/dL — ABNORMAL HIGH (ref 70–99)
Glucose-Capillary: 147 mg/dL — ABNORMAL HIGH (ref 70–99)

## 2014-02-14 LAB — CBG MONITORING, ED
GLUCOSE-CAPILLARY: 101 mg/dL — AB (ref 70–99)
Glucose-Capillary: 151 mg/dL — ABNORMAL HIGH (ref 70–99)

## 2014-02-14 LAB — RAPID URINE DRUG SCREEN, HOSP PERFORMED
Amphetamines: NOT DETECTED
BARBITURATES: POSITIVE — AB
Benzodiazepines: POSITIVE — AB
Cocaine: NOT DETECTED
Opiates: NOT DETECTED
Tetrahydrocannabinol: NOT DETECTED

## 2014-02-14 MED ORDER — DICLOFENAC SODIUM 1 % TD GEL
1.0000 "application " | Freq: Two times a day (BID) | TRANSDERMAL | Status: DC | PRN
  Filled 2014-02-14: qty 100

## 2014-02-14 MED ORDER — PROPRANOLOL HCL 20 MG PO TABS
20.0000 mg | ORAL_TABLET | Freq: Every day | ORAL | Status: DC
  Administered 2014-02-15 – 2014-02-19 (×5): 20 mg via ORAL
  Filled 2014-02-14 (×3): qty 1
  Filled 2014-02-14: qty 2
  Filled 2014-02-14 (×4): qty 1

## 2014-02-14 MED ORDER — MAGNESIUM HYDROXIDE 400 MG/5ML PO SUSP: 30.0000 mL | Freq: Every day | ORAL | Status: DC | PRN

## 2014-02-14 MED ORDER — AMITRIPTYLINE HCL 25 MG PO TABS: 25.0000 mg | ORAL_TABLET | Freq: Every evening | ORAL | Status: DC | PRN

## 2014-02-14 MED ORDER — ACETAMINOPHEN 325 MG PO TABS
650.0000 mg | ORAL_TABLET | Freq: Four times a day (QID) | ORAL | Status: DC | PRN
  Administered 2014-02-15 – 2014-02-18 (×3): 650 mg via ORAL
  Filled 2014-02-14 (×3): qty 2

## 2014-02-14 MED ORDER — LACTULOSE 10 GM/15ML PO SOLN
30.0000 g | Freq: Three times a day (TID) | ORAL | Status: DC
  Administered 2014-02-14 – 2014-02-19 (×12): 30 g via ORAL
  Filled 2014-02-14 (×23): qty 45

## 2014-02-14 MED ORDER — ALPRAZOLAM 0.5 MG PO TABS
0.5000 mg | ORAL_TABLET | Freq: Two times a day (BID) | ORAL | Status: DC | PRN
  Administered 2014-02-15 – 2014-02-19 (×4): 0.5 mg via ORAL
  Filled 2014-02-14 (×4): qty 1

## 2014-02-14 MED ORDER — OXYCODONE HCL 5 MG PO TABS
10.0000 mg | ORAL_TABLET | Freq: Two times a day (BID) | ORAL | Status: DC | PRN
  Administered 2014-02-15 – 2014-02-18 (×7): 10 mg via ORAL
  Filled 2014-02-14 (×7): qty 2

## 2014-02-14 MED ORDER — RIFAXIMIN 550 MG PO TABS
550.0000 mg | ORAL_TABLET | Freq: Two times a day (BID) | ORAL | Status: DC
  Administered 2014-02-14 – 2014-02-19 (×10): 550 mg via ORAL
  Filled 2014-02-14 (×17): qty 1

## 2014-02-14 MED ORDER — SPIRONOLACTONE 25 MG PO TABS
50.0000 mg | ORAL_TABLET | Freq: Every day | ORAL | Status: DC
  Administered 2014-02-15 – 2014-02-16 (×2): 50 mg via ORAL
  Filled 2014-02-14 (×2): qty 2
  Filled 2014-02-14: qty 1
  Filled 2014-02-14: qty 2
  Filled 2014-02-14: qty 1

## 2014-02-14 MED ORDER — INSULIN GLARGINE 100 UNIT/ML ~~LOC~~ SOLN
45.0000 [IU] | Freq: Every day | SUBCUTANEOUS | Status: DC
  Administered 2014-02-14: 45 [IU] via SUBCUTANEOUS

## 2014-02-14 MED ORDER — INSULIN GLARGINE 100 UNIT/ML SOLOSTAR PEN: 45.0000 [IU] | PEN_INJECTOR | Freq: Every day | SUBCUTANEOUS | Status: DC

## 2014-02-14 MED ORDER — IBUPROFEN 200 MG PO TABS
200.0000 mg | ORAL_TABLET | Freq: Four times a day (QID) | ORAL | Status: DC | PRN
  Administered 2014-02-14: 200 mg via ORAL
  Filled 2014-02-14: qty 1

## 2014-02-14 MED ORDER — FUROSEMIDE 20 MG PO TABS
20.0000 mg | ORAL_TABLET | Freq: Every day | ORAL | Status: DC
  Administered 2014-02-15: 20 mg via ORAL
  Filled 2014-02-14 (×4): qty 1

## 2014-02-14 MED ORDER — ALUM & MAG HYDROXIDE-SIMETH 200-200-20 MG/5ML PO SUSP: 30.0000 mL | ORAL | Status: DC | PRN
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
## 2014-02-14 NOTE — [facility] Group Notes (Signed)
Adult Psychoeducational Group Note  Date:  02/14/2014 Time:  10:00 PM  Group Topic/Focus:  Wrap-Up Group:   The focus of this group is to help patients review their daily goal of treatment and discuss progress on daily workbooks.  Participation Level:  Did Not Attend  Participation Quality:  None  Affect:  None  Cognitive:  None  Insight: None  Engagement in Group:  None  Modes of Intervention:  Discussion  Additional Comments:  [name] did not attend group.  Victorino Sparrow A 02/14/2014, 10:00 PM

## 2014-02-14 NOTE — ED Notes (Signed)
PT BELONGINGS SENT WITH WITH [facility] TO [facility]

## 2014-02-14 NOTE — BH Assessment (Signed)
Relayed results of assessment to Darlyne Russian, [place]. Per Juanda Crumble, pt should be observed overnight, and seen in AM by psychiatry for possible discharge as these symptoms has resolved in less the 24 hours on past ED visits.   Dr. Aline Brochure, EDP is in agreement with plan.   RN informed of the plan.   Lear Ng, [facility] Triage Specialist 02/14/2014 2:43 AM
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
## 2014-02-14 NOTE — Progress Notes (Signed)
Patient ID: [name], male   DOB: November 27, 1955, 58 y.o.   MRN: [id number] Seen by Heloise Purpura NP and he has determined that client will be admitted to the adult unit. He will be moved to the adult unit this evening but waiting for adult unit to have a bed and staff for admission. He is aware of the transfer and is happy to be staying in the hospital.He has been talking and restless in his sleep. He needs observation and often assist as he has an unsteady gait, hx of falls and pain and swelling in his legs and feet.

## 2014-02-14 NOTE — Plan of Care (Signed)
[place] Observation Crisis Plan  Reason for Crisis Plan:  Crisis Stabilization   Plan of Care:  Referral for Inpatient Hospitalization  Family Support:    Notes in EPIC report pt has no family supports.  However, his grandson took him to ED to get help.  Current Living Environment:  Living Arrangements: Alone  Insurance:  [facility] Account   Name Acct ID Class Status Primary Coverage   [name], [name] [id number] [place] - [employer] MEDICARE CHOICE PPO        Guarantor Account (for Hospital Account [id number])   Name Relation to [place]? Acct Type   [place], [place]   Address Phone       [address] [place], [place] [address] [phone](H)          Coverage Information (for Hospital Account [id number])   F/O Payor/Plan Precert #   [facility] MEDICARE CHOICE PPO    Subscriber Subscriber #   [name], [name] [id number]   Address [place] [address] [phone]      Legal Guardian:   Pt is presumed to be his own guardian.  Primary Care Provider:  Barbette Merino, [name]  Current Outpatient Providers:  None currently; has intake appointment with unspecified provider  Psychiatrist:    None currently; has intake appointment with unspecified provider  Counselor/Therapist:   None  Compliant with Medications:  Unknown  Additional Information:  After consulting with Darrell Garnet, [name] it has been determined that pt requires psychiatric hospitalization for safety.  Pt accepted to [facility], Rm 304-2.  Pt signed Voluntary Admission and Consent for Treatment.  He also signed Consent to Release Information, but does not have any current outpatient providers.  [name], [name] Ysidro Evert [place] 9/25/20154:12 PM
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
## 2014-02-14 NOTE — Progress Notes (Signed)
Called Debarah Crape [facility] at 1815 hrs to ask who was on called and made aware that according to the on call schedule that Dr. Adele Schilder was on call but also it says Arfeen out and I was instructed to call Guadelupe Sabin NP for the orders for insulin and his shoes; [place] NP called around 1820 hrs and message left call this Probation officer at [phone].

## 2014-02-14 NOTE — Progress Notes (Signed)
[facility] INPATIENT:  Family/Significant Other Suicide Prevention Education  Suicide Prevention Education:  Patient Refusal for Family/Significant Other Suicide Prevention Education: The patient [name] has refused to provide written consent for family/significant other to be provided Family/Significant Other Suicide Prevention Education during admission and/or prior to discharge.  Physician notified.  Davina Poke 02/14/2014, 3:10 PM

## 2014-02-14 NOTE — ED Notes (Signed)
[facility] CALLED TO TRANSPORT PATIENT TO [facility]

## 2014-02-14 NOTE — ED Notes (Signed)
Pt requesting sandwich at this time.
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
## 2014-02-14 NOTE — Progress Notes (Signed)
D: Patient laying in bed all shift. Gets up only for medication. Patient stated "I am too tired to get off the bed". Patient complained of leg pain rated 10/10. On assessment, both legs appear swollen and reddish, ++ pitting edema, warm to touch. Patient offered and accepted Ibuprofen 200 mg PO PRN for pain. Moderately effective. CBG at bedtime was 147 mg/dl.  A: Patient given support and encouragement. Patient encouraged to elevate his legs while sitting and try to get off the bed as much as he can. Patient encouraged to verbalize needs to staff and be compliant with treatment plans. Due medications given as ordered. Safety maintained. R: Patient very cooperative and compliant with medications.      Will continue to monitor patient.

## 2014-02-14 NOTE — BH Assessment (Signed)
Reviewed provider note, pt is medically cleared and having SI with plans to jump in front of a car. Pt is know to this writer from 2 recent assessments.  Requested TA equipment be placed with pt.   Assessment to begin shortly.    Lear Ng, [facility] Triage Specialist 02/14/2014 2:16 AM

## 2014-02-14 NOTE — Progress Notes (Signed)
Observation admission done and report given [facility]

## 2014-02-14 NOTE — ED Notes (Signed)
Tele psych at the bedside.

## 2014-02-14 NOTE — Progress Notes (Signed)
Patient ID: [name], male   DOB: Oct 29, 1955, 58 y.o.   MRN: [id number] Transferred to adult unit, Report given to [place]

## 2014-02-14 NOTE — Plan of Care (Signed)
[facility] Observation Crisis Plan  Reason for Crisis Plan:  Crisis Stabilization   Plan of Care:  Referral for Inpatient Hospitalization  Family Support:      Current Living Environment:  Living Arrangements: Alone  Insurance:   Hospital Account   Name Acct ID Class Status Primary Coverage   [name], [name] [id number] [place] PPO        Guarantor Account (for Hospital Account [id number])   Name Relation to [place]? Acct Type   [name], [name]   Address Phone       [address] [place], [place] [address] [phone](H)          Coverage Information (for Hospital Account [id number])   F/O Payor/Plan Precert #   [facility] MEDICARE CHOICE PPO    Subscriber Subscriber #   [name], [name] [id number]   Address Phone   [address] [place] [address] [phone]      Legal Guardian:     Primary Care Provider:  Barbette Merino, MD  Current Outpatient Providers:  medical dr  Psychiatrist:     Counselor/Therapist:     Compliant with Medications:  Yes  Additional Information:   [name] 9/25/20153:08 PM

## 2014-02-14 NOTE — ED Notes (Signed)
CBG 101 mg/dL reported to [facility]

## 2014-02-15 ENCOUNTER — Encounter (HOSPITAL_COMMUNITY): Payer: Self-pay | Admitting: Psychiatry

## 2014-02-15 DIAGNOSIS — D61818 Other pancytopenia: Secondary | ICD-10-CM

## 2014-02-15 DIAGNOSIS — I10 Essential (primary) hypertension: Secondary | ICD-10-CM

## 2014-02-15 DIAGNOSIS — K7030 Alcoholic cirrhosis of liver without ascites: Secondary | ICD-10-CM

## 2014-02-15 DIAGNOSIS — R45851 Suicidal ideations: Secondary | ICD-10-CM

## 2014-02-15 LAB — GLUCOSE, CAPILLARY
GLUCOSE-CAPILLARY: 250 mg/dL — AB (ref 70–99)
GLUCOSE-CAPILLARY: 177 mg/dL — AB (ref 70–99)
Glucose-Capillary: 124 mg/dL — ABNORMAL HIGH (ref 70–99)
Glucose-Capillary: 184 mg/dL — ABNORMAL HIGH (ref 70–99)
Glucose-Capillary: 213 mg/dL — ABNORMAL HIGH (ref 70–99)

## 2014-02-15 LAB — RETICULOCYTES
RBC.: 2.87 MIL/uL — AB (ref 4.22–5.81)
RETIC COUNT ABSOLUTE: 91.8 10*3/uL (ref 19.0–186.0)
Retic Ct Pct: 3.2 % — ABNORMAL HIGH (ref 0.4–3.1)

## 2014-02-15 LAB — AMMONIA: Ammonia: 63 umol/L — ABNORMAL HIGH (ref 11–60)

## 2014-02-15 MED ORDER — INSULIN ASPART 100 UNIT/ML ~~LOC~~ SOLN
0.0000 [IU] | Freq: Three times a day (TID) | SUBCUTANEOUS | Status: DC
  Administered 2014-02-15: 2 [IU] via SUBCUTANEOUS
  Administered 2014-02-16 (×2): 1 [IU] via SUBCUTANEOUS
  Administered 2014-02-16 – 2014-02-17 (×3): 2 [IU] via SUBCUTANEOUS
  Administered 2014-02-18: 3 [IU] via SUBCUTANEOUS
  Administered 2014-02-18: 5 [IU] via SUBCUTANEOUS
  Administered 2014-02-19: 3 [IU] via SUBCUTANEOUS

## 2014-02-15 MED ORDER — INSULIN GLARGINE 100 UNIT/ML ~~LOC~~ SOLN
38.0000 [IU] | Freq: Every day | SUBCUTANEOUS | Status: DC
  Administered 2014-02-15 – 2014-02-18 (×4): 38 [IU] via SUBCUTANEOUS

## 2014-02-15 MED ORDER — POTASSIUM CHLORIDE CRYS ER 20 MEQ PO TBCR
40.0000 meq | EXTENDED_RELEASE_TABLET | Freq: Once | ORAL | Status: AC
  Administered 2014-02-15: 40 meq via ORAL
  Filled 2014-02-15: qty 2

## 2014-02-15 MED ORDER — FUROSEMIDE 20 MG PO TABS
20.0000 mg | ORAL_TABLET | Freq: Two times a day (BID) | ORAL | Status: DC
  Administered 2014-02-15 – 2014-02-19 (×8): 20 mg via ORAL
  Filled 2014-02-15 (×12): qty 1

## 2014-02-15 MED ORDER — INSULIN GLARGINE 100 UNIT/ML ~~LOC~~ SOLN: 40.0000 [IU] | Freq: Every day | SUBCUTANEOUS | Status: DC

## 2014-02-15 NOTE — BHH Suicide Risk Assessment (Signed)
Suicide Risk Assessment  Admission Assessment     Nursing information obtained from:  Patient Demographic factors:  Male;Caucasian;Low socioeconomic status Current Mental Status:  Suicidal ideation indicated by patient Loss Factors:  Loss of significant relationship;Financial problems / change in socioeconomic status Historical Factors:  Family history of suicide Risk Reduction Factors:  Sense of responsibility to family Total Time spent with patient: 45 minutes  CLINICAL FACTORS:   Depression:   Hopelessness Insomnia Severe Chronic Pain Medical Diagnoses and Treatments/Surgeries  Psychiatric Specialty Exam:     Blood pressure 151/71, pulse 74, temperature 98.9 F (37.2 C), temperature source Oral, resp. rate 18, height 5\' 10"  (1.778 m), weight 104.327 kg (230 lb).Body mass index is 33 kg/(m^2).  General Appearance: Fairly Groomed  Engineer, water::  Fair  Speech:  Clear and Coherent  Volume:  Decreased  Mood:  Anxious and Depressed  Affect:  Depressed and worried  Thought Process:  Coherent and Goal Directed  Orientation:  Full (Time, Place, and Person)  Thought Content:  events symptoms worries concerns  Suicidal Thoughts:  No  Homicidal Thoughts:  No  Memory:  Immediate;   Fair Recent;   Fair Remote;   Fair  Judgement:  Fair  Insight:  Present and Shallow  Psychomotor Activity:  Restlessness  Concentration:  Fair  Recall:  [employer] of Knowledge:NA  Language: Fair  Akathisia:  No  Handed:    AIMS (if indicated):     Assets:  Desire for Improvement  Sleep:  Number of Hours: 6.5   Musculoskeletal: Strength & Muscle Tone: within normal limits Gait & Station: affected by the neuropathy Patient leans: N/A  COGNITIVE FEATURES THAT CONTRIBUTE TO RISK:  Closed-mindedness Polarized thinking Thought constriction (tunnel vision)    SUICIDE RISK:   Moderate:  Frequent suicidal ideation with limited intensity, and duration, some specificity in terms of plans, no  associated intent, good self-control, limited dysphoria/symptomatology, some risk factors present, and identifiable protective factors, including available and accessible social support.  PLAN OF CARE: Supportive approach/coping skills                               CBT;mindfulness                               Reassess and optimize treatment with psychotropics  I certify that inpatient services furnished can reasonably be expected to improve the patient's condition.  Sukhmani Fetherolf A 02/15/2014, 4:47 PM

## 2014-02-15 NOTE — Progress Notes (Signed)
Did not attended group 

## 2014-02-15 NOTE — Progress Notes (Signed)
Patient ID: [name], male   DOB: [birth year], 58 y.o.   MRN: [id number]   D: Pt has been appropriate on the unit today, he has been joking and playing with his peers all day. Pt reported that he felt much better, and that he needed to be released on Monday for court.  Pt was informed that he was going to be started on insulin, pt reported that he felt that the doctor was going to take his CBG to 0. Pts blood sugar was 184 at dinner, he received 2 units of insulin. Pt reported being negative SI/HI, no AH/VH noted. A: 15 min checks continued for patient safety. R: Pt safety maintained.
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
## 2014-02-15 NOTE — BHH Group Notes (Signed)
[place] [place] Group Notes:  (Nursing/MHT/Case Management/Adjunct)  Date:  02/15/2014  Time:  2:44 PM  Type of Therapy:  Psychoeducational Skills- Healthy Coping Skills  Participation Level:  Did Not Attend [place] Sellers 02/15/2014, 2:44 PM

## 2014-02-15 NOTE — H&P (Signed)
Psychiatric Admission Assessment Adult  Patient Identification:  [name] Date of Evaluation:  02/15/2014 Chief Complaint:  BIPOLAR 1 History of Present Illness:   [name] is an 58 y.o. male brought in by EMS after grandson called due to being concerned about pt's health and mental health. Pt has presented to ED three other times this month with similar symptoms.  He has had 10 ER visits over the past six months.   Mood is depressed, affect congruent. Judgement partially impaired. Pt reported SI prior to admission  with thoughts of walking in traffic or drinking antifreeze.   Pt presently denies current SIHI.  Pt denies a/v hallucinations, denies self-harm, denies substance abuse. Rates depression 0/10 Anxiety 0/10  Pt reports girlfriend has left him for another man, and dtr who moved in to take care of him has been stealing his medication. His PCP is Dr Jonelle Sidle.  He has multiple medical co-morbidities overladen with social issues.    Elements:  Location:  Depression SI. Quality:  long term girlfriend left him. Severity:  chronic. Timing:  worsening but chronic. Duration:  chronic. Context:  complex psycho-social medical issues. Associated Signs/Synptoms: Depression Symptoms:  depressed mood, feelings of worthlessness/guilt, hopelessness, suicidal thoughts without plan, anxiety, (Hypo) Manic Symptoms: NA Anxiety Symptoms:  NA Psychotic Symptoms: NA PTSD Symptoms:NA  Total Time spent with patient: 30 minutes  Psychiatric Specialty Exam: Physical Exam  Constitutional: He is oriented to person, place, and time. He appears well-developed.  HENT:  Head: Normocephalic.  Neck: Normal range of motion. Neck supple.  Musculoskeletal: Normal range of motion.  Neurological: He is alert and oriented to person, place, and time.  Skin: Skin is warm and dry.    Review of Systems  Eyes: Positive for blurred vision.  Cardiovascular: Positive for leg swelling.   Neurological: Positive for sensory change.  Psychiatric/Behavioral: Positive for depression.    Blood pressure 151/71, pulse 74, temperature 98.9 F (37.2 C), temperature source Oral, resp. rate 18, height 5\' 10"  (1.778 m), weight 104.327 kg (230 lb).Body mass index is 33 kg/(m^2).  General Appearance: Disheveled  Eye Sport and exercise psychologist::  Fair  Speech:  Normal Rate  Volume:  Decreased  Mood:  Depressed  Affect:  Flat  Thought Process:  Disorganized  Orientation:  Full (Time, Place, and Person)  Thought Content:  NA  Suicidal Thoughts:  Yes.  without intent/plan  Homicidal Thoughts:  No  Memory:  Immediate;   Poor Recent;   Poor Remote;   Poor  Judgement:  Impaired  Insight:  Lacking  Psychomotor Activity:  Negative  Concentration:  Fair  Recall:  Poor  Fund of Knowledge:Poor  Language: Fair  Akathisia:  Negative  Handed:  Right  AIMS (if indicated):     Assets:  Desire for Improvement Resilience  Sleep:  Number of Hours: 6.5    Musculoskeletal: Strength & Muscle Tone: decreased Gait & Station: normal Patient leans: N/A  Past Psychiatric History: Diagnosis:Stated bipolar, depression  Hospitalizations:many   Outpatient Care:yes non compliant   Substance Abuse Care:past alcohol   Self-Mutilation:no  Suicidal Attempts:reported no  Violent Behaviors:none   Past Medical History:   Past Medical History  Diagnosis Date  . Neuropathy   . Diabetes mellitus   . Bipolar affect, depressed   . Hypertension   . Arthritis   . Stroke     Mini stroke about 28yrs ago  . Cirrhosis   . Alcohol abuse   . Chronic pain   . Cocaine abuse   .  Muscle spasm     both legs  . Encephalopathy, hepatic   . Detached retina   . COPD (chronic obstructive pulmonary disease)     emphysema  . Bronchitis   . Barrett's esophagus   . GERD (gastroesophageal reflux disease)     has ulcer  . Anemia    Loss of Consciousness:  yes injury at work Allergies:  No Known Allergies PTA  Medications: Prescriptions prior to admission  Medication Sig Dispense Refill  . ALPRAZolam (XANAX) 0.5 MG tablet Take 0.5 mg by mouth 3 (three) times daily as needed for anxiety.      Marland Kitchen amitriptyline (ELAVIL) 25 MG tablet Take 1 tablet (25 mg total) by mouth at bedtime as needed for sleep.  7 tablet  0  . diclofenac sodium (VOLTAREN) 1 % GEL Apply 1 application topically 2 (two) times daily as needed (pain in shoulders and back.).       Marland Kitchen furosemide (LASIX) 20 MG tablet Take 1 tablet (20 mg total) by mouth daily.  30 tablet  0  . ibuprofen (ADVIL,MOTRIN) 200 MG tablet Take 200 mg by mouth every 6 (six) hours as needed for moderate pain.      Marland Kitchen insulin aspart (NOVOLOG FLEXPEN) 100 UNIT/ML FlexPen Inject 6 Units into the skin 3 (three) times daily with meals.      . Insulin Glargine (LANTUS SOLOSTAR) 100 UNIT/ML Solostar Pen Inject 45 Units into the skin at bedtime.  15 mL  0  . lactulose (CHRONULAC) 10 GM/15ML solution Take 45 mLs (30 g total) by mouth 3 (three) times daily. Titrate to at least 3 BMs daily  240 mL  0  . Oxycodone HCl 10 MG TABS Take 10 mg by mouth 3 (three) times daily. scheduled      . propranolol (INDERAL) 20 MG tablet Take 1 tablet (20 mg total) by mouth daily.  7 tablet  0  . rifaximin (XIFAXAN) 550 MG TABS tablet Take 1 tablet (550 mg total) by mouth 2 (two) times daily.  14 tablet  1  . spironolactone (ALDACTONE) 50 MG tablet Take 1 tablet (50 mg total) by mouth daily.  7 tablet  0    Previous Psychotropic Medications:  Medication/Dose    See med list             Substance Abuse History in the last 12 months:  Yes.    Consequences of Substance Abuse: Medical Consequences:  liver disease, DM  Social History:  reports that he has been smoking Cigarettes.  He has a 30 pack-year smoking history. He has never used smokeless tobacco. He reports that he drinks alcohol. He reports that he uses illicit drugs (Cocaine). Additional Social History: Pain Medications: not  abusing Prescriptions: not abusing Over the Counter: not abusing History of alcohol / drug use?: Yes Name of Substance 1: alcohol  1 - Last Use / Amount: 1 1/2 years ago Name of Substance 2: cocaine 2 - Last Use / Amount: 1 1/2 years ago                Current Place of Residence:   Place of Birth: [place]   Family Members: Marital Status:  Single Children:  Sons:  Daughters: Relationships: Recent breakup with GF of 17 years Education:  8th grade Educational Problems/Performance: Religious Beliefs/Practices: History of Abuse (Emotional/Phsycial/Sexual) Occupational Experiences; Military History:  None. Legal History: Hobbies/Interests:  Family History:   Family History  Problem Relation Age of Onset  . Hypotension Mother  Results for orders placed during the hospital encounter of 02/14/14 (from the past 72 hour(s))  GLUCOSE, CAPILLARY     Status: Abnormal   Collection Time    02/14/14  4:52 PM      Result Value Ref Range   Glucose-Capillary 145 (*) 70 - 99 mg/dL   Comment 1 Notify RN    GLUCOSE, CAPILLARY     Status: Abnormal   Collection Time    02/14/14  9:13 PM      Result Value Ref Range   Glucose-Capillary 147 (*) 70 - 99 mg/dL  GLUCOSE, CAPILLARY     Status: Abnormal   Collection Time    02/15/14 11:56 AM      Result Value Ref Range   Glucose-Capillary 124 (*) 70 - 99 mg/dL   Psychological Evaluations:  Assessment:   DSM5:  Schizophrenia Disorders:  NA Obsessive-Compulsive Disorders:  NA Trauma-Stressor Disorders: NA Substance/Addictive Disorders:  Alcohol Related Disorder - Severe (303.90) Depressive Disorders:  Major Depressive Disorder - Severe (296.23)  AXIS I:  Major Depression, Recurrent severe AXIS II:  Deferred AXIS III:   Past Medical History  Diagnosis Date  . Neuropathy   . Diabetes mellitus   . Bipolar affect, depressed   . Hypertension   . Arthritis   . Stroke     Mini stroke about 21yrs ago  . Cirrhosis   .  Alcohol abuse   . Chronic pain   . Cocaine abuse   . Muscle spasm     both legs  . Encephalopathy, hepatic   . Detached retina   . COPD (chronic obstructive pulmonary disease)     emphysema  . Bronchitis   . Barrett's esophagus   . GERD (gastroesophageal reflux disease)     has ulcer  . Anemia    AXIS IV:  economic problems, educational problems, housing problems, occupational problems, other psychosocial or environmental problems, problems related to social environment, problems with access to health care services and problems with primary support group AXIS V:  21-30 behavior considerably influenced by delusions or hallucinations OR serious impairment in judgment, communication OR inability to function in almost all areas  Treatment Plan/Recommendations:     Treatment Plan Summary: Review of chart, vital signs, medications and notes Daily contact with the patient to assess and evaluate synmptoms and progress in treatment  Plan: 1. Continue crisis management and stabilization. Estimated length of stay 5-7 days  2.  Medication management to reduce current symptoms to base line and improve patient's overall level of functioning    Medications reviewed with the pateint and no untoward effects    Individual and group therapy encouraged    Coping skills for depression, substance abuse, and anxiety 3.  Treat health problems as indicated.      -consult placed with [facility] for medical evaluation and management of chronic disease       -discussed with Dr Sabra Heck use of Amitriptyline with his h/o Thrombocytopenia and decision  Is to remain on current dose   4.  Develop treatment plan to decrease risk of relapse upon discharge and the need for readmission 5.  Psych-social education regarding relapse prevention and self care. 6.  Health care follow up as needed for medical problems 7.  Continue home medications where appropriate 8.  Disposition in progress   Treatment Plan  Summary: Daily contact with patient to assess and evaluate symptoms and progress in treatment Medication management Current Medications:  Current Facility-Administered Medications  Medication Dose Route  Frequency Provider Last Rate Last Dose  . acetaminophen (TYLENOL) tablet 650 mg  650 mg Oral Q6H PRN Benjamine Mola, FNP      . ALPRAZolam Duanne Moron) tablet 0.5 mg  0.5 mg Oral BID PRN Benjamine Mola, FNP   0.5 mg at 02/15/14 0827  . alum & mag hydroxide-simeth (MAALOX/MYLANTA) 200-200-20 MG/5ML suspension 30 mL  30 mL Oral Q4H PRN Benjamine Mola, FNP      . amitriptyline (ELAVIL) tablet 25 mg  25 mg Oral QHS PRN Benjamine Mola, FNP      . diclofenac sodium (VOLTAREN) 1 % transdermal gel 1 application  1 application Topical BID PRN Benjamine Mola, FNP      . furosemide (LASIX) tablet 20 mg  20 mg Oral Daily Benjamine Mola, FNP   20 mg at 02/15/14 0824  . ibuprofen (ADVIL,MOTRIN) tablet 200 mg  200 mg Oral Q6H PRN Benjamine Mola, FNP   200 mg at 02/14/14 2228  . insulin glargine (LANTUS) injection 45 Units  45 Units Subcutaneous QHS Hampton Abbot, MD   45 Units at 02/14/14 2224  . lactulose (CHRONULAC) 10 GM/15ML solution 30 g  30 g Oral TID Benjamine Mola, FNP   30 g at 02/15/14 0955  . magnesium hydroxide (MILK OF MAGNESIA) suspension 30 mL  30 mL Oral Daily PRN Benjamine Mola, FNP      . oxyCODONE (Oxy IR/ROXICODONE) immediate release tablet 10 mg  10 mg Oral BID PRN Benjamine Mola, FNP   10 mg at 02/15/14 0827  . propranolol (INDERAL) tablet 20 mg  20 mg Oral Daily Benjamine Mola, FNP   20 mg at 02/15/14 4580  . rifaximin (XIFAXAN) tablet 550 mg  550 mg Oral BID Benjamine Mola, FNP   550 mg at 02/15/14 0955  . spironolactone (ALDACTONE) tablet 50 mg  50 mg Oral Daily Benjamine Mola, FNP   50 mg at 02/15/14 0825    Observation Level/Precautions:  15 minute checks  Laboratory:  CBC Chemistry Profile     Multiple abnormals  Psychotherapy:  none  Medications:  See [facility]  Consultations:  Internal  medicine  Discharge Concerns:  F/u primary care and basic needs  Estimated LOS: 5-7 days  Other:     I certify that inpatient services furnished can reasonably be expected to improve the patient's condition.   [place], [place] 9/26/201512:13 PM  I personally assessed the patient, reviewed the physical exam and labs and formulated the treatment plan Geralyn Flash A. Sabra Heck, M.D.

## 2014-02-15 NOTE — [facility] Group Notes (Signed)
[place] Group Notes:  (Clinical Social Work)  02/15/2014     10-11AM  Summary of Progress/Problems:   The main focus of today's process group was to learn how to use a decisional balance exercise to move forward in the Stages of Change, which were described and discussed.  Motivational Interviewing and a worksheet were utilized to help patients explore in depth the perceived benefits and costs of a self-sabotaging behavior, as well as the  benefits and costs of replacing that with a healthy coping mechanism.   The patient expressed that he came to the hospital on his own when he started having cravings for alcohol that he could not control.  He feels he is in the Preparation Stage of change, trying to figure out how to "not get in that trap."  The patient became sleepy, and drowsed off several times throughout the remainder of group.  Type of Therapy:  Group Therapy - Process   Participation Level:  Active  Participation Quality:  Attentive and Drowsy  Affect:  Blunted  Cognitive:  Appropriate  Insight:  Developing/Improving  Engagement in Therapy:  Developing/Improving  Modes of Intervention:  Education, Motivational Interviewing  Selmer Dominion, LCSW 02/15/2014, 12:18 PM

## 2014-02-15 NOTE — [facility] Group Notes (Signed)
[place] Group Notes:  (Nursing/MHT/Case Management/Adjunct)  Date:  02/15/2014  Time:  2:12 PM  Type of Therapy:  Psychoeducational Skills  Participation Level:  Active  Participation Quality:  Appropriate  Affect:  Appropriate  Cognitive:  Appropriate  Insight:  Appropriate  Engagement in Group:  Engaged  Modes of Intervention:  Discussion  Summary of Progress/Problems: Pt did attend self inventory group, pt reported that he was negative SI/HI, no AH/VH noted. Pt rated his depression as a 4, and his helplessness/hopelessness as a 5.     Pt reported no issues or concerns, however did request to speak with a SW 1:1.   Ariyanna Oien, [place] 02/15/2014, 2:12 PM

## 2014-02-15 NOTE — Consult Note (Signed)
[facility] Medical Consultation  [name] FTD:[id number] DOB: 07-22-1955 DOA: 02/14/2014 PCP: Barbette Merino, MD   Requesting physician: Dr Sabra Heck.  Date of consultation: 9-26 Reason for consultation: Medical Management.   Impression/Recommendations Active Problems:   Cirrhosis   Pancytopenia   Anemia   MDD (major depressive disorder)   1-Pancytopenia: Chronic. No evidence of active bleeding. Thrombocytopenia was thought to be secondary to cirrhosis. Platelet stable. Reviewing medications Amitriptyline can cause BM suppression, leukopenia thrombocytopenia. Recommend stopping this medication. Will also order HIV, anemia panel. Might need hematologist evaluation out patient for consideration of BM biopsy.   2-Cirrhosis: continue with lactulose, spironolactone, inderal lasix, rifaximin. No evidence of hepatic encephalopathy. Check ammonia level.   3-Diabetes: Will decrease lantus to 38 unit from 45. CBG in am at 105. Will add SSI, CBG check. Change diet to carb modified.   4-Bilateral LE edema, chronic. Will increase lasix to 20 mg BID. Follow electrolytes and renal function.   5-Depression: per psych.  6-feet pain. History of neuropathy; avoid ibuprofen in setting thrombocytopenia.   I will followup again tomorrow. Please contact me if I can be of assistance in the meanwhile. Thank you for this consultation.  Chief Complaint: Depression.   HPI: [name] is a 58 y.o. male with history of alcoholic liver cirrhosis, diabetes, COPD, Chronic thrombocytopenia platelet range 40 and 50 since 2013 thought to be secondary to cirrhosis,  Chronic leukopenia, intermittent for last 2 years at least, anemia, who was admitted to [facility] 9-25 with suicidal thought and depression.  Patient alert and oriented to time , place, and situation. He relates that he came because he is depress. He is going through divorced. He denies dyspnea, chest pain, abdominal pain. No melena, hematemesis,  hematochezia. The last time he drank alcohol was 3 years ago. He relates bilateral LE edema for last 2 years, swelling is better than before. He  Has been taking his water pill. He report 3 BM per day.     Review of Systems:  Negative, except as per HPI.   Past Medical History  Diagnosis Date  . Neuropathy   . Diabetes mellitus   . Bipolar affect, depressed   . Hypertension   . Arthritis   . Stroke     Mini stroke about 13yrs ago  . Cirrhosis   . Alcohol abuse   . Chronic pain   . Cocaine abuse   . Muscle spasm     both legs  . Encephalopathy, hepatic   . Detached retina   . COPD (chronic obstructive pulmonary disease)     emphysema  . Bronchitis   . Barrett's esophagus   . GERD (gastroesophageal reflux disease)     has ulcer  . Anemia    Past Surgical History  Procedure Laterality Date  . Fracture surgery      Leg and arm 60yrs ago  . Esophagogastroduodenoscopy  04/04/2012    Procedure: ESOPHAGOGASTRODUODENOSCOPY (EGD);  Surgeon: Irene Shipper, MD;  Location: [facility] ENDOSCOPY;  Service: Endoscopy;  Laterality: N/A;  . Esophagogastroduodenoscopy Left 03/13/2013    Procedure: ESOPHAGOGASTRODUODENOSCOPY (EGD);  Surgeon: Arta Silence, MD;  Location: [facility] ENDOSCOPY;  Service: Endoscopy;  Laterality: Left;  Marland Kitchen Eye surgery  8 months ago both eyes    cataracts both eyes, detached eye, gas pocket  . Vasectomy    . Pars plana vitrectomy Left 07/08/2013    Procedure: PARS PLANA VITRECTOMY WITH 25 GAUGE;  Surgeon: Hurman Horn, MD;  Location: [place];  Service:  Ophthalmology;  Laterality: Left;  . Intraocular lens removal Left 07/08/2013    Procedure: REMOVAL OF INTRAOCULAR LENS;  Surgeon: Hurman Horn, MD;  Location: [place];  Service: Ophthalmology;  Laterality: Left;  . Placement and suture of secondary intraocular lens Left 07/08/2013    Procedure: PLACEMENT AND SUTURE OF SECONDARY INTRAOCULAR LENS;  Surgeon: Hurman Horn, MD;  Location: [place];  Service: Ophthalmology;  Laterality:  Left;  Insertion of Anterior Capsule Intraocular Lens   . Esophagogastroduodenoscopy N/A 01/16/2014    Procedure: ESOPHAGOGASTRODUODENOSCOPY (EGD);  Surgeon: Winfield Cunas., MD;  Location: [facility] ENDOSCOPY;  Service: Endoscopy;  Laterality: N/A;  . Colonoscopy N/A 01/17/2014    Procedure: COLONOSCOPY;  Surgeon: Winfield Cunas., MD;  Location: [facility] ENDOSCOPY;  Service: Endoscopy;  Laterality: N/A;  possible banding   Social History:  reports that he has been smoking Cigarettes.  He has a 30 pack-year smoking history. He has never used smokeless tobacco. He reports that he drinks alcohol. He reports that he uses illicit drugs (Cocaine).  No Known Allergies Family History  Problem Relation Age of Onset  . Hypotension Mother     Prior to Admission medications   Medication Sig Start Date End Date Taking? Authorizing Provider  ALPRAZolam Duanne Moron) 0.5 MG tablet Take 0.5 mg by mouth 3 (three) times daily as needed for anxiety.   Yes Historical Provider, MD  amitriptyline (ELAVIL) 25 MG tablet Take 1 tablet (25 mg total) by mouth at bedtime as needed for sleep. 02/12/14  Yes Clayton Bibles, PA-C  diclofenac sodium (VOLTAREN) 1 % GEL Apply 1 application topically 2 (two) times daily as needed (pain in shoulders and back.).    Yes Historical Provider, MD  furosemide (LASIX) 20 MG tablet Take 1 tablet (20 mg total) by mouth daily. 01/29/14  Yes Thurnell Lose, MD  ibuprofen (ADVIL,MOTRIN) 200 MG tablet Take 200 mg by mouth every 6 (six) hours as needed for moderate pain.   Yes Historical Provider, MD  insulin aspart (NOVOLOG FLEXPEN) 100 UNIT/ML FlexPen Inject 6 Units into the skin 3 (three) times daily with meals.   Yes Historical Provider, MD  Insulin Glargine (LANTUS SOLOSTAR) 100 UNIT/ML Solostar Pen Inject 45 Units into the skin at bedtime. 11/17/13  Yes Malvin Johns, MD  lactulose (CHRONULAC) 10 GM/15ML solution Take 45 mLs (30 g total) by mouth 3 (three) times daily. Titrate to at least 3 BMs daily  01/29/14  Yes Thurnell Lose, MD  Oxycodone HCl 10 MG TABS Take 10 mg by mouth 3 (three) times daily. scheduled   Yes Historical Provider, MD  propranolol (INDERAL) 20 MG tablet Take 1 tablet (20 mg total) by mouth daily. 02/12/14  Yes Clayton Bibles, PA-C  rifaximin (XIFAXAN) 550 MG TABS tablet Take 1 tablet (550 mg total) by mouth 2 (two) times daily. 02/12/14  Yes Clayton Bibles, PA-C  spironolactone (ALDACTONE) 50 MG tablet Take 1 tablet (50 mg total) by mouth daily. 02/12/14  Yes Clayton Bibles, PA-C   Physical Exam: Blood pressure 151/71, pulse 74, temperature 98.9 F (37.2 C), temperature source Oral, resp. rate 18, height 5\' 10"  (1.778 m), weight 104.327 kg (230 lb). Filed Vitals:   02/15/14 0649  BP: 151/71  Pulse: 74  Temp:   Resp:    General:  Appears calm and comfortable Eyes: PERRL, normal lids, irises & conjunctiva ENT: grossly normal hearing, lips & tongue, very poor dentition.  Neck: no LAD, masses or thyromegaly Cardiovascular: RRR, no m/r/g. Plus 2  LE edema. Respiratory: CTA bilaterally, no w/r/r. Normal respiratory effort. Abdomen: soft, ntnd Skin: no rash or induration seen on limited exam Musculoskeletal: grossly normal tone BUE/BLE Neurologic: grossly non-focal. Alert and oriented to place, time and situation.   Labs on Admission:  Basic Metabolic Panel:  Recent Labs Lab 02/08/14 1830 02/10/14 1547 02/13/14 2213  NA 136* 142 137  K 4.0 4.1 4.1  CL 99 107 106  CO2 23 23 22   GLUCOSE 67* 145* 176*  BUN 23 27* 11  CREATININE 2.12* 1.59* 0.89  CALCIUM 7.9* 8.4 8.3*   Liver Function Tests:  Recent Labs Lab 02/08/14 1830 02/10/14 1547 02/13/14 2213  AST 46* 50* 46*  ALT 18 19 18   ALKPHOS 82 98 91  BILITOT 1.9* 2.1* 1.9*  PROT 5.5* 6.2 5.9*  ALBUMIN 2.6* 2.8* 2.7*   No results found for this basename: LIPASE, AMYLASE,  in the last 168 hours  Recent Labs Lab 02/10/14 1547  AMMONIA 38   CBC:  Recent Labs Lab 02/08/14 1830 02/10/14 1547  02/13/14 2213  WBC 4.5 2.7* 2.5*  NEUTROABS  --  1.4* 1.2*  HGB 9.0* 9.2* 9.0*  HCT 25.8* 27.1* 26.4*  MCV 89.6 90.9 91.0  PLT 50* 46* 43*   Cardiac Enzymes:  Recent Labs Lab 02/08/14 1830  TROPONINI <0.30   BNP: No components found with this basename: POCBNP,  CBG:  Recent Labs Lab 02/14/14 0025 02/14/14 0547 02/14/14 1652 02/14/14 2113 02/15/14 1156  GLUCAP 151* 101* 145* 147* 124*    Radiological Exams on Admission: No results found.   Time spent: 65 minutes.   Niel Hummer A [facility] Pager [phone]  If 7PM-7AM, please contact night-coverage [email or web address] Password [email or web address] 02/15/2014, 1:44 PM

## 2014-02-16 DIAGNOSIS — F332 Major depressive disorder, recurrent severe without psychotic features: Principal | ICD-10-CM

## 2014-02-16 DIAGNOSIS — F411 Generalized anxiety disorder: Secondary | ICD-10-CM

## 2014-02-16 LAB — GLUCOSE, CAPILLARY
GLUCOSE-CAPILLARY: 124 mg/dL — AB (ref 70–99)
Glucose-Capillary: 147 mg/dL — ABNORMAL HIGH (ref 70–99)

## 2014-02-16 LAB — CBC WITH DIFFERENTIAL/PLATELET
BASOS ABS: 0 10*3/uL (ref 0.0–0.1)
Basophils Relative: 0 % (ref 0–1)
EOS PCT: 11 % — AB (ref 0–5)
Eosinophils Absolute: 0.4 10*3/uL (ref 0.0–0.7)
HCT: 27.4 % — ABNORMAL LOW (ref 39.0–52.0)
Hemoglobin: 9.3 g/dL — ABNORMAL LOW (ref 13.0–17.0)
LYMPHS ABS: 1.1 10*3/uL (ref 0.7–4.0)
LYMPHS PCT: 28 % (ref 12–46)
MCH: 30.9 pg (ref 26.0–34.0)
MCHC: 33.9 g/dL (ref 30.0–36.0)
MCV: 91 fL (ref 78.0–100.0)
Monocytes Absolute: 0.6 10*3/uL (ref 0.1–1.0)
Monocytes Relative: 14 % — ABNORMAL HIGH (ref 3–12)
NEUTROS PCT: 47 % (ref 43–77)
Neutro Abs: 1.9 10*3/uL (ref 1.7–7.7)
PLATELETS: 49 10*3/uL — AB (ref 150–400)
RBC: 3.01 MIL/uL — AB (ref 4.22–5.81)
RDW: 17.2 % — AB (ref 11.5–15.5)
WBC: 4 10*3/uL (ref 4.0–10.5)

## 2014-02-16 LAB — BASIC METABOLIC PANEL
Anion gap: 9 (ref 5–15)
BUN: 9 mg/dL (ref 6–23)
CALCIUM: 8.6 mg/dL (ref 8.4–10.5)
CO2: 23 mEq/L (ref 19–32)
Chloride: 104 mEq/L (ref 96–112)
Creatinine, Ser: 0.89 mg/dL (ref 0.50–1.35)
GFR calc non Af Amer: >90 mL/min (ref >90)
GLUCOSE: 126 mg/dL — AB (ref 70–99)
POTASSIUM: 4.2 meq/L (ref 3.7–5.3)
SODIUM: 136 meq/L — AB (ref 137–147)

## 2014-02-16 LAB — IRON AND TIBC
IRON: 65 ug/dL (ref 42–135)
Saturation Ratios: 20 % (ref 20–55)
TIBC: 324 ug/dL (ref 215–435)
UIBC: 259 ug/dL (ref 125–400)

## 2014-02-16 LAB — HIV ANTIBODY (ROUTINE TESTING W REFLEX): HIV: NONREACTIVE

## 2014-02-16 LAB — HEMOGLOBIN A1C
Hgb A1c MFr Bld: 5.9 % — ABNORMAL HIGH (ref <5.7)
MEAN PLASMA GLUCOSE: 123 mg/dL — AB (ref <117)

## 2014-02-16 LAB — VITAMIN B12: Vitamin B-12: 1413 pg/mL — ABNORMAL HIGH (ref 211–911)

## 2014-02-16 LAB — FOLATE: FOLATE: 14.4 ng/mL

## 2014-02-16 LAB — FERRITIN: Ferritin: 34 ng/mL (ref 22–322)

## 2014-02-16 MED ORDER — SPIRONOLACTONE 100 MG PO TABS
100.0000 mg | ORAL_TABLET | Freq: Every day | ORAL | Status: DC
  Administered 2014-02-17 – 2014-02-19 (×3): 100 mg via ORAL
  Filled 2014-02-16 (×5): qty 1

## 2014-02-16 NOTE — Clinical Social Work Note (Signed)
Clinical Social Work Note  At [employer] request, spoke with patient about his request to discharge tomorrow morning early.  He states that he has a court appearance due on Tuesday morning at [place] him that he can talk to his treatment team tomorrow about discharge.  He was satisfied with that answer.  Selmer Dominion, LCSW 02/16/2014, 4:09 PM

## 2014-02-16 NOTE — [facility] Group Notes (Signed)
[place] Group Notes:  (Nursing/MHT/Case Management/Adjunct)  Date:  02/16/2014  Time:  4:09 PM  Type of Therapy:  Psychoeducational Skills- Pt Self Inventory Group.   Participation Level:  Did Not Attend    Benancio Deeds [facility] 02/16/2014, 4:09 PM

## 2014-02-16 NOTE — Progress Notes (Signed)
Patient seen interacting with peers on day hall. Patient reports having a "bad headache". Acetaminophen 650 mg po prn given. Medication was effective. Denies SI, AH/VH. Due medications given as ordered.Safety maintained by every 15 minutes check. Will continue to monitor patient.

## 2014-02-16 NOTE — [facility] Group Notes (Signed)
[place] Group Notes:  (Nursing/MHT/Case Management/Adjunct)  Date:  02/16/2014  Time:  4:10 PM   Type of Therapy:  Psychoeducational Skills- Healthy Support Systems.   Participation Level:  Did Not Attend  Ventura Sellers 02/16/2014, 4:10 PM

## 2014-02-16 NOTE — Plan of Care (Signed)
Problem: Ineffective individual coping Goal: STG: Patient will remain free from self harm Outcome: Progressing Patient has not engaged in any self harm while on unit and denies SI  Problem: Diagnosis: Increased Risk For Suicide Attempt Goal: STG-Patient Will Comply With Medication Regime Outcome: Progressing Patient has been med compliant

## 2014-02-16 NOTE — [facility] Group Notes (Signed)
[facility] Group Notes: (Clinical Social Work)   02/16/2014      Type of Therapy:  Group Therapy   Participation Level:  Did Not Attend - was invited, and declined to get out of bed   Selmer Dominion, LCSW 02/16/2014, 12:23 PM

## 2014-02-16 NOTE — Progress Notes (Signed)
[facility] MD Progress Note  02/16/2014 3:47 PM [name]  MRN:  [id number] Subjective:  [name] is settling down. States that he still wants to be in court Tuesday morning to get all "this mess" cleared out. States that his brother impersonated him and got him in a lot of trouble. He will be then looking at getting " a small place " for himself. He did sleep last night. (roomate stated he could not sleep as [name] snored and talked all night long). Reports no other concerns other than being "bored." Diagnosis:   DSM5:  Depressive Disorders:  Major Depressive Disorder - Moderate (296.22) Total Time spent with patient: 30 minutes  Axis I: Generalized Anxiety Disorder  ADL's:  Intact  Sleep: Fair  Appetite:  Fair  Psychiatric Specialty Exam: Physical Exam  Review of Systems  Constitutional: Positive for malaise/fatigue.  HENT: Negative.   Eyes: Negative.   Respiratory: Negative.   Cardiovascular: Negative.   Gastrointestinal: Negative.   Genitourinary: Negative.   Musculoskeletal: Negative.   Skin: Negative.   Neurological: Positive for weakness.       Neuropathy  Endo/Heme/Allergies: Negative.   Psychiatric/Behavioral: Positive for depression. The patient is nervous/anxious.     Blood pressure 143/57, pulse 66, temperature 97.1 F (36.2 C), temperature source Oral, resp. rate 18, height 5' 10"  (1.778 m), weight 104.327 kg (230 lb).Body mass index is 33 kg/(m^2).  General Appearance: Fairly Groomed  Engineer, water::  Fair  Speech:  Clear and Coherent, Slow and not spontaneous  Volume:  Decreased  Mood:  Anxious and worried  Affect:  anxious, worried  Thought Process:  Coherent and Goal Directed  Orientation:  Full (Time, Place, and Person)  Thought Content:  symptoms events worries concerns  Suicidal Thoughts:  No  Homicidal Thoughts:  No  Memory:  Immediate;   Fair Recent;   Fair Remote;   Fair  Judgement:  Fair  Insight:  Present  Psychomotor Activity:  Restlessness   Concentration:  Fair  Recall:  [employer] of Knowledge:NA  Language: Fair  Akathisia:  No  Handed:    AIMS (if indicated):     Assets:  Desire for Improvement  Sleep:  Number of Hours: 6.5   Musculoskeletal: Strength & Muscle Tone: within normal limits Gait & Station: normal Patient leans: N/A  Current Medications: Current Facility-Administered Medications  Medication Dose Route Frequency Provider Last Rate Last Dose  . acetaminophen (TYLENOL) tablet 650 mg  650 mg Oral Q6H PRN Benjamine Mola, FNP   650 mg at 02/16/14 0644  . ALPRAZolam Duanne Moron) tablet 0.5 mg  0.5 mg Oral BID PRN Benjamine Mola, FNP   0.5 mg at 02/16/14 0835  . alum & mag hydroxide-simeth (MAALOX/MYLANTA) 200-200-20 MG/5ML suspension 30 mL  30 mL Oral Q4H PRN Benjamine Mola, FNP      . amitriptyline (ELAVIL) tablet 25 mg  25 mg Oral QHS PRN Benjamine Mola, FNP      . diclofenac sodium (VOLTAREN) 1 % transdermal gel 1 application  1 application Topical BID PRN Benjamine Mola, FNP      . furosemide (LASIX) tablet 20 mg  20 mg Oral BID Belkys A Regalado, MD   20 mg at 02/16/14 0835  . insulin aspart (novoLOG) injection 0-9 Units  0-9 Units Subcutaneous TID WC Belkys A Regalado, MD   1 Units at 02/16/14 1207  . insulin glargine (LANTUS) injection 38 Units  38 Units Subcutaneous QHS Belkys A Regalado, MD  38 Units at 02/15/14 2119  . lactulose (CHRONULAC) 10 GM/15ML solution 30 g  30 g Oral TID Benjamine Mola, FNP   30 g at 02/16/14 1207  . magnesium hydroxide (MILK OF MAGNESIA) suspension 30 mL  30 mL Oral Daily PRN Benjamine Mola, FNP      . oxyCODONE (Oxy IR/ROXICODONE) immediate release tablet 10 mg  10 mg Oral BID PRN Benjamine Mola, FNP   10 mg at 02/16/14 0835  . propranolol (INDERAL) tablet 20 mg  20 mg Oral Daily Benjamine Mola, FNP   20 mg at 02/16/14 0835  . rifaximin (XIFAXAN) tablet 550 mg  550 mg Oral BID Benjamine Mola, FNP   550 mg at 02/16/14 0835  . [START ON 02/17/2014] spironolactone (ALDACTONE) tablet  100 mg  100 mg Oral Daily Nishant Dhungel, MD        Lab Results:  Results for orders placed during the hospital encounter of 02/14/14 (from the past 48 hour(s))  GLUCOSE, CAPILLARY     Status: Abnormal   Collection Time    02/14/14  4:52 PM      Result Value Ref Range   Glucose-Capillary 145 (*) 70 - 99 mg/dL   Comment 1 Notify RN    GLUCOSE, CAPILLARY     Status: Abnormal   Collection Time    02/14/14  9:13 PM      Result Value Ref Range   Glucose-Capillary 147 (*) 70 - 99 mg/dL  GLUCOSE, CAPILLARY     Status: Abnormal   Collection Time    02/15/14 11:56 AM      Result Value Ref Range   Glucose-Capillary 124 (*) 70 - 99 mg/dL  GLUCOSE, CAPILLARY     Status: Abnormal   Collection Time    02/15/14  4:36 PM      Result Value Ref Range   Glucose-Capillary 213 (*) 70 - 99 mg/dL  GLUCOSE, CAPILLARY     Status: Abnormal   Collection Time    02/15/14  4:45 PM      Result Value Ref Range   Glucose-Capillary 184 (*) 70 - 99 mg/dL   Comment 1 Documented in Chart     Comment 2 Notify RN    HEMOGLOBIN A1C     Status: Abnormal   Collection Time    02/15/14  8:05 PM      Result Value Ref Range   Hemoglobin A1C 5.9 (*) <5.7 %   Comment: (NOTE)                                                                               According to the ADA Clinical Practice Recommendations for 2011, when     HbA1c is used as a screening test:      >=6.5%   Diagnostic of Diabetes Mellitus               (if abnormal result is confirmed)     5.7-6.4%   Increased risk of developing Diabetes Mellitus     References:Diagnosis and Classification of Diabetes Mellitus,Diabetes     Care,2011,34(Suppl 1):S62-S69 and Standards of Medical Care in  Diabetes - 2011,Diabetes Care,2011,34 (Suppl 1):S11-S61.   Mean Plasma Glucose 123 (*) <117 mg/dL   Comment: Performed at [facility] (ROUTINE TESTING)     Status: None   Collection Time    02/15/14  8:05 PM      Result Value Ref  Range   HIV 1&2 Ab, 4th Generation NONREACTIVE  NONREACTIVE   Comment: (NOTE)     A NONREACTIVE HIV Ag/Ab result does not exclude HIV infection since     the time frame for seroconversion is variable. If acute HIV infection     is suspected, a HIV-1 RNA Qualitative TMA test is recommended.     HIV-1/2 Antibody Diff         Not indicated.     HIV-1 RNA, Qual TMA           Not indicated.     PLEASE NOTE: This information has been disclosed to you from records     whose confidentiality may be protected by state law. If your state     requires such protection, then the state law prohibits you from making     any further disclosure of the information without the specific written     consent of the person to whom it pertains, or as otherwise permitted     by law. A general authorization for the release of medical or other     information is NOT sufficient for this purpose.     The performance of this assay has not been clinically validated in     patients less than 63 years old.     Performed at [facility]     Status: Abnormal   Collection Time    02/15/14  8:05 PM      Result Value Ref Range   Vitamin B-12 1413 (*) 211 - 911 pg/mL   Comment: Performed at [facility]     Status: None   Collection Time    02/15/14  8:05 PM      Result Value Ref Range   Folate 14.4     Comment: (NOTE)     Reference Ranges            Deficient:       0.4 - 3.3 ng/mL            Indeterminate:   3.4 - 5.4 ng/mL            Normal:              > 5.4 ng/mL     Performed at [facility]     Status: None   Collection Time    02/15/14  8:05 PM      Result Value Ref Range   Iron 65  42 - 135 ug/dL   TIBC 324  215 - 435 ug/dL   Saturation Ratios 20  20 - 55 %   UIBC 259  125 - 400 ug/dL   Comment: Performed at [facility]     Status: None   Collection Time    02/15/14  8:05 PM      Result Value Ref Range   Ferritin 34  22 -  322 ng/mL   Comment: Performed at [facility]     Status: Abnormal   Collection Time    02/15/14  8:05 PM      Result  Value Ref Range   Retic Ct Pct 3.2 (*) 0.4 - 3.1 %   RBC. 2.87 (*) 4.22 - 5.81 MIL/uL   Retic Count, Manual 91.8  19.0 - 186.0 K/uL   Comment: Performed at [facility]     Status: Abnormal   Collection Time    02/15/14  8:05 PM      Result Value Ref Range   Ammonia 63 (*) 11 - 60 umol/L   Comment: Performed at [place], CAPILLARY     Status: Abnormal   Collection Time    02/15/14  9:15 PM      Result Value Ref Range   Glucose-Capillary 250 (*) 70 - 99 mg/dL  GLUCOSE, CAPILLARY     Status: Abnormal   Collection Time    02/15/14  9:56 PM      Result Value Ref Range   Glucose-Capillary 177 (*) 70 - 99 mg/dL  GLUCOSE, CAPILLARY     Status: Abnormal   Collection Time    02/16/14  6:01 AM      Result Value Ref Range   Glucose-Capillary 124 (*) 70 - 99 mg/dL   Comment 1 Notify RN     Comment 2 Documented in Chart    BASIC METABOLIC PANEL     Status: Abnormal   Collection Time    02/16/14  6:28 AM      Result Value Ref Range   Sodium 136 (*) 137 - 147 mEq/L   Potassium 4.2  3.7 - 5.3 mEq/L   Chloride 104  96 - 112 mEq/L   CO2 23  19 - 32 mEq/L   Glucose, Bld 126 (*) 70 - 99 mg/dL   BUN 9  6 - 23 mg/dL   Creatinine, Ser 0.89  0.50 - 1.35 mg/dL   Calcium 8.6  8.4 - 10.5 mg/dL   GFR calc non Af Amer >90  >90 mL/min   GFR calc Af Amer >90  >90 mL/min   Comment: (NOTE)     The eGFR has been calculated using the CKD EPI equation.     This calculation has not been validated in all clinical situations.     eGFR's persistently <90 mL/min signify possible Chronic Kidney     Disease.   Anion gap 9  5 - 15   Comment: Performed at [facility]  CBC WITH DIFFERENTIAL     Status: Abnormal   Collection Time    02/16/14  6:28 AM      Result Value Ref Range   WBC 4.0   4.0 - 10.5 K/uL   RBC 3.01 (*) 4.22 - 5.81 MIL/uL   Hemoglobin 9.3 (*) 13.0 - 17.0 g/dL   HCT 27.4 (*) 39.0 - 52.0 %   MCV 91.0  78.0 - 100.0 fL   MCH 30.9  26.0 - 34.0 pg   MCHC 33.9  30.0 - 36.0 g/dL   RDW 17.2 (*) 11.5 - 15.5 %   Platelets 49 (*) 150 - 400 K/uL   Comment: REPEATED TO VERIFY     SPECIMEN CHECKED FOR CLOTS     PLATELET COUNT CONFIRMED BY SMEAR   Neutrophils Relative % 47  43 - 77 %   Lymphocytes Relative 28  12 - 46 %   Monocytes Relative 14 (*) 3 - 12 %   Eosinophils Relative 11 (*) 0 - 5 %   Basophils Relative 0  0 - 1 %  Neutro Abs 1.9  1.7 - 7.7 K/uL   Lymphs Abs 1.1  0.7 - 4.0 K/uL   Monocytes Absolute 0.6  0.1 - 1.0 K/uL   Eosinophils Absolute 0.4  0.0 - 0.7 K/uL   Basophils Absolute 0.0  0.0 - 0.1 K/uL   Smear Review PLATELET COUNT CONFIRMED BY SMEAR     Comment: Performed at [place], [facility]     Status: Abnormal   Collection Time    02/16/14 11:47 AM      Result Value Ref Range   Glucose-Capillary 147 (*) 70 - 99 mg/dL    Physical Findings: AIMS: Facial and Oral Movements Muscles of Facial Expression: None, normal Lips and Perioral Area: None, normal Jaw: None, normal Tongue: None, normal,Extremity Movements Upper (arms, wrists, hands, fingers): None, normal Lower (legs, knees, ankles, toes): None, normal, Trunk Movements Neck, shoulders, hips: None, normal, Overall Severity Severity of abnormal movements (highest score from questions above): None, normal Incapacitation due to abnormal movements: None, normal Patient's awareness of abnormal movements (rate only patient's report): No Awareness, Dental Status Current problems with teeth and/or dentures?: No Does patient usually wear dentures?: No  CIWA:    COWS:     Treatment Plan Summary: Daily contact with patient to assess and evaluate symptoms and progress in treatment Medication management  Plan: Supportive approach/coping skills           Will pursue  actual medication regime           Will facilitate getting to court on Tuesday AM ( states this is creating a lot of stress and           he wants to take care of this ASAP)  Medical Decision Making Problem Points:  Review of psycho-social stressors (1) Data Points:  Review of medication regiment & side effects (2)  I certify that inpatient services furnished can reasonably be expected to improve the patient's condition.   Sierra Bissonette A 02/16/2014, 3:47 PM

## 2014-02-16 NOTE — [facility] Group Notes (Signed)
[facility] Group Notes:  (Nursing/MHT/Case Management/Adjunct)  Date:  02/16/2014  Time:  4:00 PM  Type of Therapy:  Psychoeducational Skills- Pt Self Inventory Group.   Participation Level:  Did Not Attend  Benancio Deeds [facility] 02/16/2014, 4:00 PM

## 2014-02-16 NOTE — Progress Notes (Signed)
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  Assessment/Plan: Chronic Pancytopenia Likely in the setting of alcoholic cirrhosis.  Hemoglobin and platelets are low at their baseline. Patient also has mild leucopenia which could be related to the same.  -Monitor CBC, WBC has improved this a.m. Monitor for any bleeding. Avoid hepatotoxic agents.  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 Patient has had frequent hospitalization for hepatic encephalopathy. Also had EGD done recently for dropping hemoglobin with finding of mild esophageal varices and portal gastropathy.ammonia level is slightly elevated with no signs of encephalopathy at present. He is taking his lactulose as instructed and having 2-3 bowel movements daily.  -Continue Inderal , lactulose and rifaximine. Lasix and Aldactone dose increased given worsened lower extremity edema. Monitor chemistry in next 2-3 days. -Patient seems to have poor understanding of his disease. He will followup with [facility] as outpatient.  Diabetes mellitus FSG in the low 100s and given underlying liver disease Lantus dose has been reduced to 38 units from 45 units. Currently stable. Continue to monitor on sliding scale insulin  Bilateral lower extremity edema Secondary to cirrhosis of liver and hypoalbuminemia. Lasix and Aldactone dose doubled. Monitor electrolytes and kidney function ( recheck in 2-3 days)  Major depression Management per primary team.  Lower extremity pain Is chronic neuropathy pain. Avoid NSAIDs in the setting of cytopenia and  gastropathy. Continue amitriptyline which he has been on for a long time and unlikely primary cause of leucopenia   No further recommendations. Hospitalist will sign off. Please call for any questions. Thank for allowing [place] to participate in patient's care      HPI/Subjective: Patient seen and examined. He denies been depressed or having suicidal thoughts.  Objective: Filed Vitals:   02/16/14 0611  BP: 143/57  Pulse: 66  Temp:   Resp:    No intake or output data in the 24 hours ending 02/16/14 1149 Filed Weights   02/14/14 1815  Weight: 104.327 kg (230 lb)    Exam:   General:  Middle aged male in no acute distress  HEENT: No pallor, poor dentition, moist mucosa  Cardiovascular: Normal S1-S2, no murmurs  Respiratory: Clear bilaterally  Abdomen: Soft, nondistended, nontender, bowel sounds present  Extremities: Warm, 1+ pitting edema bilaterally  CNS: Alert and oriented, no tremors  Data Reviewed: Basic Metabolic Panel:  Recent Labs Lab 02/10/14 1547 02/13/14 2213 02/16/14 0628  NA 142 137 136*  K 4.1 4.1 4.2  CL 107 106 104  CO2 23 22 23   GLUCOSE 145* 176* 126*  BUN 27* 11 9  CREATININE 1.59* 0.89 0.89  CALCIUM 8.4 8.3* 8.6   Liver Function Tests:  Recent Labs Lab 02/10/14 1547 02/13/14 2213  AST 50* 46*  ALT 19 18  ALKPHOS 98 91  BILITOT 2.1* 1.9*  PROT 6.2 5.9*  ALBUMIN 2.8* 2.7*   No results found for this basename: LIPASE, AMYLASE,  in the last 168 hours  Recent Labs Lab 02/10/14 1547 02/15/14 2005  AMMONIA 38 63*   CBC:  Recent Labs Lab 02/10/14 1547 02/13/14 2213 02/16/14 0628  WBC 2.7* 2.5* 4.0  NEUTROABS 1.4* 1.2* 1.9  HGB 9.2*  9.0* 9.3*  HCT 27.1* 26.4* 27.4*  MCV 90.9 91.0 91.0  PLT 46* 43* 49*   Cardiac Enzymes: No results found for this basename: CKTOTAL, CKMB, CKMBINDEX, TROPONINI,  in the last 168 hours BNP (last 3 results)  Recent Labs  02/08/14 1830 02/10/14 1547  PROBNP 966.3* 805.5*    CBG:  Recent Labs Lab 02/15/14 1645 02/15/14 2115 02/15/14 2156 02/16/14 0601 02/16/14 1147  GLUCAP 184* 250* 177* 124* 147*    No results found for this or any previous visit (from the past 240 hour(s)).   Studies: No results found.  Scheduled Meds: . furosemide  20 mg Oral BID  . insulin aspart  0-9 Units Subcutaneous TID WC  . insulin glargine  38 Units Subcutaneous QHS  . lactulose  30 g Oral TID  . propranolol  20 mg Oral Daily  . rifaximin  550 mg Oral BID  . [START ON 02/17/2014] spironolactone  100 mg Oral Daily   Continuous Infusions:    Time spent: 25 minutes    [name], [place]  [facility] Pager [phone]. If 7PM-7AM, please contact night-coverage at [email or web address], password [facility] 02/16/2014, 11:49 AM  LOS: 2 days

## 2014-02-16 NOTE — Progress Notes (Signed)
Patient did attend the evening speaker AA meeting.  

## 2014-02-16 NOTE — Progress Notes (Signed)
Spoke with patient 1:1 briefly as he was anxious to get back to peers in the dayroom. Patient laughs and jokes frequently though affect and mood are both anxious. He complains of all over body pain of a 10/10 and is requesting his prn pain med. Roxicodone given along with support and encouragement. On reassess, pt is asleep. He denies SI/HI and is safe. [name]

## 2014-02-17 LAB — GLUCOSE, CAPILLARY
GLUCOSE-CAPILLARY: 183 mg/dL — AB (ref 70–99)
GLUCOSE-CAPILLARY: 161 mg/dL — AB (ref 70–99)
GLUCOSE-CAPILLARY: 189 mg/dL — AB (ref 70–99)
GLUCOSE-CAPILLARY: 190 mg/dL — AB (ref 70–99)
Glucose-Capillary: 116 mg/dL — ABNORMAL HIGH (ref 70–99)
Glucose-Capillary: 174 mg/dL — ABNORMAL HIGH (ref 70–99)

## 2014-02-17 MED ORDER — AMITRIPTYLINE HCL 25 MG PO TABS
50.0000 mg | ORAL_TABLET | Freq: Every evening | ORAL | Status: DC | PRN
  Administered 2014-02-17 – 2014-02-18 (×2): 50 mg via ORAL
  Filled 2014-02-17: qty 2
  Filled 2014-02-17: qty 8
  Filled 2014-02-17: qty 2

## 2014-02-17 NOTE — Clinical Social Work Note (Addendum)
CSW spoke with patient's friend, Serita Butcher.  She advised patient had been renting an apartment from her for he and his daughter and her children.  She stated neither patient nor his daughter is paying the rent at this time nor are they paying the electric bill.  She stated she is willing to allow patient to return to the apartment if he wants to pay the rent but cannot allow him to continue living there will the bills not being paid.  Patient advised of conversation and stated he is not interested in returning to the apartment  He also stated he is not interested in going into an assisted living facility as it will take all of his money.  He shared he plans to contact his brother in [place] and move in with him.  Patient advised brother plans to charge him $300 per month.    CSW also contacted the DA's office regarding patient's report of having a court date on Tuesday, 02/18/14.  CSW advised patient has a Nurse, adult, Harless Nakayama, and contact/letter should be addressed to him.  CSW spoke with Public Defender's office to obtain contact information.  Letter to be faxed as requested by patient.

## 2014-02-17 NOTE — Progress Notes (Signed)
[facility] MD Progress Note  02/17/2014 4:26 PM [name]  MRN:  [id number] Subjective:  [name] was initially wanting to get out today as he had a court date 41. Then when he knew we could send a statement and get it postponed he was wanting to do that. He initially was agreeing to go to an assisted living facility and then changed his mind. Medication wise he is focused on Xanax and opioids for his neuropathy. SW got information that he could go back to where he was before. He stated that he could go with his brother for $300 a month. States he is depressed more so because of his circumstance Diagnosis:   DSM5: Substance/Addictive Disorders:  Alcohol Related Disorder - Mild (305.00) Depressive Disorders:  Major Depressive Disorder - Moderate (296.22) Total Time spent with patient: 30 minutes  Axis I: Generalized Anxiety Disorder  ADL's:  Intact  Sleep: Fair  Appetite:  Fair   Psychiatric Specialty Exam: Physical Exam  Review of Systems  Constitutional: Positive for malaise/fatigue.  HENT: Negative.   Eyes: Negative.   Respiratory: Negative.   Cardiovascular: Negative.   Gastrointestinal: Negative.   Genitourinary: Negative.   Musculoskeletal: Negative.   Skin: Negative.   Neurological: Positive for weakness.  Endo/Heme/Allergies: Negative.   Psychiatric/Behavioral: Positive for depression and substance abuse. The patient is nervous/anxious and has insomnia.     Blood pressure 124/63, pulse 66, temperature 98.4 F (36.9 C), temperature source Oral, resp. rate 20, height 5' 10"  (1.778 m), weight 104.327 kg (230 lb).Body mass index is 33 kg/(m^2).  General Appearance: Fairly Groomed  Engineer, water::  Fair  Speech:  Clear and Coherent and Slow  Volume:  Decreased  Mood:  Anxious and Depressed  Affect:  Restricted  Thought Process:  Coherent and Goal Directed  Orientation:  Full (Time, Place, and Person)  Thought Content:  events symptoms worries concerns  Suicidal  Thoughts:  No  Homicidal Thoughts:  No  Memory:  Immediate;   Fair Recent;   Fair Remote;   Fair  Judgement:  Fair  Insight:  Present  Psychomotor Activity:  Restlessness  Concentration:  Fair  Recall:  [employer] of Knowledge:NA  Language: Fair  Akathisia:  No  Handed:    AIMS (if indicated):     Assets:  Desire for Improvement  Sleep:  Number of Hours: 5   Musculoskeletal: Strength & Muscle Tone: within normal limits Gait & Station: affected by the neuropathy   Patient leans: N/A  Current Medications: Current Facility-Administered Medications  Medication Dose Route Frequency Provider Last Rate Last Dose  . acetaminophen (TYLENOL) tablet 650 mg  650 mg Oral Q6H PRN Benjamine Mola, FNP   650 mg at 02/16/14 0644  . ALPRAZolam Duanne Moron) tablet 0.5 mg  0.5 mg Oral BID PRN Benjamine Mola, FNP   0.5 mg at 02/16/14 0835  . alum & mag hydroxide-simeth (MAALOX/MYLANTA) 200-200-20 MG/5ML suspension 30 mL  30 mL Oral Q4H PRN Benjamine Mola, FNP      . amitriptyline (ELAVIL) tablet 25 mg  25 mg Oral QHS PRN Benjamine Mola, FNP      . diclofenac sodium (VOLTAREN) 1 % transdermal gel 1 application  1 application Topical BID PRN Benjamine Mola, FNP      . furosemide (LASIX) tablet 20 mg  20 mg Oral BID Belkys A Regalado, MD   20 mg at 02/17/14 0758  . insulin aspart (novoLOG) injection 0-9 Units  0-9 Units Subcutaneous TID  WC Elmarie Shiley, MD   2 Units at 02/17/14 1212  . insulin glargine (LANTUS) injection 38 Units  38 Units Subcutaneous QHS Belkys A Regalado, MD   38 Units at 02/16/14 2200  . lactulose (CHRONULAC) 10 GM/15ML solution 30 g  30 g Oral TID Benjamine Mola, FNP   30 g at 02/17/14 1211  . magnesium hydroxide (MILK OF MAGNESIA) suspension 30 mL  30 mL Oral Daily PRN Benjamine Mola, FNP      . oxyCODONE (Oxy IR/ROXICODONE) immediate release tablet 10 mg  10 mg Oral BID PRN Benjamine Mola, FNP   10 mg at 02/17/14 1211  . propranolol (INDERAL) tablet 20 mg  20 mg Oral Daily Benjamine Mola, FNP   20 mg at 02/17/14 0758  . rifaximin (XIFAXAN) tablet 550 mg  550 mg Oral BID Benjamine Mola, FNP   550 mg at 02/17/14 0758  . spironolactone (ALDACTONE) tablet 100 mg  100 mg Oral Daily Nishant Dhungel, MD   100 mg at 02/17/14 4132    Lab Results:  Results for orders placed during the hospital encounter of 02/14/14 (from the past 48 hour(s))  GLUCOSE, CAPILLARY     Status: Abnormal   Collection Time    02/15/14  4:36 PM      Result Value Ref Range   Glucose-Capillary 213 (*) 70 - 99 mg/dL  GLUCOSE, CAPILLARY     Status: Abnormal   Collection Time    02/15/14  4:45 PM      Result Value Ref Range   Glucose-Capillary 184 (*) 70 - 99 mg/dL   Comment 1 Documented in Chart     Comment 2 Notify RN    HEMOGLOBIN A1C     Status: Abnormal   Collection Time    02/15/14  8:05 PM      Result Value Ref Range   Hemoglobin A1C 5.9 (*) <5.7 %   Comment: (NOTE)                                                                               According to the ADA Clinical Practice Recommendations for 2011, when     HbA1c is used as a screening test:      >=6.5%   Diagnostic of Diabetes Mellitus               (if abnormal result is confirmed)     5.7-6.4%   Increased risk of developing Diabetes Mellitus     References:Diagnosis and Classification of Diabetes Mellitus,Diabetes     GMWN,0272,53(GUYQI 1):S62-S69 and Standards of Medical Care in             Diabetes - 2011,Diabetes Care,2011,34 (Suppl 1):S11-S61.   Mean Plasma Glucose 123 (*) <117 mg/dL   Comment: Performed at [facility] (ROUTINE TESTING)     Status: None   Collection Time    02/15/14  8:05 PM      Result Value Ref Range   HIV 1&2 Ab, 4th Generation NONREACTIVE  NONREACTIVE   Comment: (NOTE)     A NONREACTIVE HIV Ag/Ab result does not exclude HIV infection since  the time frame for seroconversion is variable. If acute HIV infection     is suspected, a HIV-1 RNA Qualitative TMA test is  recommended.     HIV-1/2 Antibody Diff         Not indicated.     HIV-1 RNA, Qual TMA           Not indicated.     PLEASE NOTE: This information has been disclosed to you from records     whose confidentiality may be protected by state law. If your state     requires such protection, then the state law prohibits you from making     any further disclosure of the information without the specific written     consent of the person to whom it pertains, or as otherwise permitted     by law. A general authorization for the release of medical or other     information is NOT sufficient for this purpose.     The performance of this assay has not been clinically validated in     patients less than 19 years old.     Performed at [facility]     Status: Abnormal   Collection Time    02/15/14  8:05 PM      Result Value Ref Range   Vitamin B-12 1413 (*) 211 - 911 pg/mL   Comment: Performed at [facility]     Status: None   Collection Time    02/15/14  8:05 PM      Result Value Ref Range   Folate 14.4     Comment: (NOTE)     Reference Ranges            Deficient:       0.4 - 3.3 ng/mL            Indeterminate:   3.4 - 5.4 ng/mL            Normal:              > 5.4 ng/mL     Performed at [facility]     Status: None   Collection Time    02/15/14  8:05 PM      Result Value Ref Range   Iron 65  42 - 135 ug/dL   TIBC 324  215 - 435 ug/dL   Saturation Ratios 20  20 - 55 %   UIBC 259  125 - 400 ug/dL   Comment: Performed at [facility]     Status: None   Collection Time    02/15/14  8:05 PM      Result Value Ref Range   Ferritin 34  22 - 322 ng/mL   Comment: Performed at [facility]     Status: Abnormal   Collection Time    02/15/14  8:05 PM      Result Value Ref Range   Retic Ct Pct 3.2 (*) 0.4 - 3.1 %   RBC. 2.87 (*) 4.22 - 5.81 MIL/uL   Retic Count, Manual 91.8  19.0 - 186.0 K/uL    Comment: Performed at [place]     Status: Abnormal   Collection Time    02/15/14  8:05 PM      Result Value Ref Range   Ammonia 63 (*) 11 - 60 umol/L   Comment: Performed at [facility]  GLUCOSE, CAPILLARY     Status: Abnormal   Collection Time    02/15/14  9:15 PM      Result Value Ref Range   Glucose-Capillary 250 (*) 70 - 99 mg/dL  GLUCOSE, CAPILLARY     Status: Abnormal   Collection Time    02/15/14  9:56 PM      Result Value Ref Range   Glucose-Capillary 177 (*) 70 - 99 mg/dL  GLUCOSE, CAPILLARY     Status: Abnormal   Collection Time    02/16/14  6:01 AM      Result Value Ref Range   Glucose-Capillary 124 (*) 70 - 99 mg/dL   Comment 1 Notify RN     Comment 2 Documented in Chart    BASIC METABOLIC PANEL     Status: Abnormal   Collection Time    02/16/14  6:28 AM      Result Value Ref Range   Sodium 136 (*) 137 - 147 mEq/L   Potassium 4.2  3.7 - 5.3 mEq/L   Chloride 104  96 - 112 mEq/L   CO2 23  19 - 32 mEq/L   Glucose, Bld 126 (*) 70 - 99 mg/dL   BUN 9  6 - 23 mg/dL   Creatinine, Ser 0.89  0.50 - 1.35 mg/dL   Calcium 8.6  8.4 - 10.5 mg/dL   GFR calc non Af Amer >90  >90 mL/min   GFR calc Af Amer >90  >90 mL/min   Comment: (NOTE)     The eGFR has been calculated using the CKD EPI equation.     This calculation has not been validated in all clinical situations.     eGFR's persistently <90 mL/min signify possible Chronic Kidney     Disease.   Anion gap 9  5 - 15   Comment: Performed at [facility]  CBC WITH DIFFERENTIAL     Status: Abnormal   Collection Time    02/16/14  6:28 AM      Result Value Ref Range   WBC 4.0  4.0 - 10.5 K/uL   RBC 3.01 (*) 4.22 - 5.81 MIL/uL   Hemoglobin 9.3 (*) 13.0 - 17.0 g/dL   HCT 27.4 (*) 39.0 - 52.0 %   MCV 91.0  78.0 - 100.0 fL   MCH 30.9  26.0 - 34.0 pg   MCHC 33.9  30.0 - 36.0 g/dL   RDW 17.2 (*) 11.5 - 15.5 %   Platelets 49 (*) 150 - 400 K/uL    Comment: REPEATED TO VERIFY     SPECIMEN CHECKED FOR CLOTS     PLATELET COUNT CONFIRMED BY SMEAR   Neutrophils Relative % 47  43 - 77 %   Lymphocytes Relative 28  12 - 46 %   Monocytes Relative 14 (*) 3 - 12 %   Eosinophils Relative 11 (*) 0 - 5 %   Basophils Relative 0  0 - 1 %   Neutro Abs 1.9  1.7 - 7.7 K/uL   Lymphs Abs 1.1  0.7 - 4.0 K/uL   Monocytes Absolute 0.6  0.1 - 1.0 K/uL   Eosinophils Absolute 0.4  0.0 - 0.7 K/uL   Basophils Absolute 0.0  0.0 - 0.1 K/uL   Smear Review PLATELET COUNT CONFIRMED BY SMEAR     Comment: Performed at [place], CAPILLARY     Status: Abnormal   Collection Time    02/16/14 11:47 AM  Result Value Ref Range   Glucose-Capillary 147 (*) 70 - 99 mg/dL  GLUCOSE, CAPILLARY     Status: Abnormal   Collection Time    02/16/14  4:55 PM      Result Value Ref Range   Glucose-Capillary 183 (*) 70 - 99 mg/dL   Comment 1 Notify RN     Comment 2 Documented in Chart    GLUCOSE, CAPILLARY     Status: Abnormal   Collection Time    02/16/14  9:41 PM      Result Value Ref Range   Glucose-Capillary 161 (*) 70 - 99 mg/dL   Comment 1 Notify RN     Comment 2 Documented in Chart    GLUCOSE, CAPILLARY     Status: Abnormal   Collection Time    02/17/14  6:12 AM      Result Value Ref Range   Glucose-Capillary 116 (*) 70 - 99 mg/dL  GLUCOSE, CAPILLARY     Status: Abnormal   Collection Time    02/17/14 12:03 PM      Result Value Ref Range   Glucose-Capillary 189 (*) 70 - 99 mg/dL    Physical Findings: AIMS: Facial and Oral Movements Muscles of Facial Expression: None, normal Lips and Perioral Area: None, normal Jaw: None, normal Tongue: None, normal,Extremity Movements Upper (arms, wrists, hands, fingers): None, normal Lower (legs, knees, ankles, toes): None, normal, Trunk Movements Neck, shoulders, hips: None, normal, Overall Severity Severity of abnormal movements (highest score from questions above): None,  normal Incapacitation due to abnormal movements: None, normal Patient's awareness of abnormal movements (rate only patient's report): No Awareness, Dental Status Current problems with teeth and/or dentures?: No Does patient usually wear dentures?: No  CIWA:    COWS:     Treatment Plan Summary: Daily contact with patient to assess and evaluate symptoms and progress in treatment Medication management  Plan: Supportive approach/coping skills/relapse prevention           Reassess and optimize response to psychotropics  Medical Decision Making Problem Points:  Review of psycho-social stressors (1) Data Points:  Review of medication regiment & side effects (2)  I certify that inpatient services furnished can reasonably be expected to improve the patient's condition.   Jazia Faraci A 02/17/2014, 4:26 PM

## 2014-02-17 NOTE — Progress Notes (Signed)
Adult Psychoeducational Group Note  Date:  02/17/2014 Time:  8:15pm Group Topic/Focus:  Wrap-Up Group:   The focus of this group is to help patients review their daily goal of treatment and discuss progress on daily workbooks.  Participation Level:  Did Not Attend   Additional Comments: Pt didn't attend group.   Theodoro Grist D 02/17/2014, 9:55 PM

## 2014-02-17 NOTE — BHH Group Notes (Signed)
[facility] LCSW Aftercare Discharge Planning Group Note   02/17/2014 12:33 PM    Participation Quality:  Appropraite  Mood/Affect:  Appropriate  Depression Rating:  4  Anxiety Rating:  4  Thoughts of Suicide:  No  Will you contract for safety?   NA  Current AVH:  No  Plan for Discharge/Comments:  Patient attended discharge planning group and actively participated in group.  He advised of admitting to the hospital due to medical as well as mental health concerns.  He shared he is not being seen by an outpatient provider is uncertain where he will live at discharge.  Patient advised he is considering an assisted living facility. CSW provided all participants with daily workbook.   Transportation Means: Patient has transportation.   Supports:  Patient has a support system.   Tonianne Fine, Eulas Post

## 2014-02-17 NOTE — BHH Group Notes (Signed)
[facility] LCSW Group Therapy          Overcoming Obstacles       1:15 -2:30        02/17/2014       Type of Therapy:  Group Therapy  Participation Level:  Appropriate  Participation Quality:  Appropriate  Affect:  Appropriate, Alert  Cognitive:  Attentive Appropriate  Insight: Developing/Improving Engaged  Engagement in Therapy: Developing/Imprvoing Engaged  Modes of Intervention:  Discussion Exploration  Education Rapport BuildingProblem-Solving Support  Summary of Progress/Problems:  The main focus of today's group was overcoming obstacles.    Patient shared the obstacle he has to overcome is trying to always be the head.  He stated he often moves too fast on situation and ends up falling behind.  Patient able to identify appropriate coping skills including slowing down and taking a better look at the situaiton.  Concha Pyo 02/17/2014

## 2014-02-17 NOTE — Progress Notes (Signed)
D: Pt presents anxious this morning, fidgety and silly. Pt denies SI/HI/AVH. Pt rates depression 5/10 and anxiety 10/10. Pt c/o of ongoing leg pain. Pt compliant with taking meds and attending groups. No adverse reaction to meds verbalized by pt at this time.  A: medications administered as ordered per MD. Verbal support given. Pt encouraged to attend groups. 15 minute checks performed for safety. R: Pt safety maintained. Pt receptive to treatment.

## 2014-02-18 LAB — GLUCOSE, CAPILLARY
Glucose-Capillary: 116 mg/dL — ABNORMAL HIGH (ref 70–99)
Glucose-Capillary: 203 mg/dL — ABNORMAL HIGH (ref 70–99)
Glucose-Capillary: 251 mg/dL — ABNORMAL HIGH (ref 70–99)
Glucose-Capillary: 142 mg/dL — ABNORMAL HIGH (ref 70–99)

## 2014-02-18 NOTE — Tx Team (Signed)
Interdisciplinary Treatment Plan Update   Date Reviewed:  02/18/2014  Time Reviewed:  11:26 AM  Progress in Treatment:   Attending groups: Yes Participating in groups: Yes Taking medication as prescribed: Yes  Tolerating medication: Yes Family/Significant other contact made: Yes, collateral contact with friend Patient understands diagnosis: Yes, patient understands diagnosis and requesting stabilization on medications  Discussing patient identified problems/goals with staff: Yes Medical problems stabilized or resolved: Yes Denies suicidal/homicidal ideation: Yes Patient has not harmed self or others: Yes  For review of initial/current patient goals, please see plan of care.  Estimated Length of Stay:  1-2 days  Reasons for Continued Hospitalization:  Anxiety Depression Medication stabilization   New Problems/Goals identified:    Discharge Plan or Barriers:   Patient considering relocating to [facility] with brother.  F/u to be scheduled in county of residence.  Additional Comments:  MD to continue medication stabilization  Patient and CSW reviewed patient's identified goals and treatment plan.  Patient verbalized understanding and agreed to treatment plan.   Attendees:  Patient:  02/18/2014 11:26 AM   Signature:  Gabriel Earing, MD 02/18/2014 11:26 AM  Signature: Carlton [name], MD 02/18/2014 11:26 AM  Signature:  Satira Sark, RN  02/18/2014 11:26 AM  Signature:  Darrol Angel, RN 02/18/2014 11:26 AM  Signature:  02/18/2014 11:26 AM  Signature:  Joette Catching, LCSW 02/18/2014 11:26 AM  Signature: Maxie Better, LCSW-A 02/18/2014 11:26 AM  Signature:  Lucinda Dell, Care Coordinator [facility] 02/18/2014 11:26 AM  Signature:  02/18/2014 11:26 AM  Signature:  02/18/2014  11:26 AM  Signature:   Lars Pinks, RN [facility] 02/18/2014  11:26 AM  Signature   02/18/2014  11:26 AM    Scribe for Treatment Team:   Joette Catching,  02/18/2014 11:26 AM

## 2014-02-18 NOTE — Progress Notes (Addendum)
D: Pt presents anxious in mood this evening. Pt observed interacting appropriately with others in the dayroom. Pt was present for group this evening. Pt is currently denying any SI/HI/AVH.   A: Pt was started on his first dose of Elavil this evening. Pt was able to verbalize the indications of Elavil. Pt was encouraged to report the effectiveness in the morning to this Probation officer.  Continued support and availability as needed was extended to this pt. Staff continue to monitor pt with q52min checks.  R: No adverse drug reactions noted. Pt remains asleep at this time. Pt receptive to treatment. [name] safety maintained.

## 2014-02-18 NOTE — Progress Notes (Signed)
[facility] MD Progress Note  02/18/2014 5:30 PM [name]  MRN:  [id number] Subjective:  [name] is still having a hard time. States he does not want to go to an assisted living facility. He has couple of places he can go to. Still very vague as far a symptoms.  Diagnosis:   DSM5: Substance/Addictive Disorders:  Alcohol Related Disorder - Moderate (303.90) Depressive Disorders:  Major Depressive Disorder - Moderate (296.22) Total Time spent with patient: 30 minutes  Axis I: Generalized Anxiety Disorder  ADL's:  Intact  Sleep: Poor  Appetite:  Fair  Psychiatric Specialty Exam: Physical Exam  Review of Systems  Constitutional: Positive for malaise/fatigue.  HENT: Negative.   Eyes: Negative.   Respiratory: Negative.   Cardiovascular: Negative.   Gastrointestinal: Negative.   Genitourinary: Negative.   Musculoskeletal: Negative.   Skin: Negative.   Neurological: Positive for weakness.  Endo/Heme/Allergies: Negative.   Psychiatric/Behavioral: Positive for depression. The patient is nervous/anxious and has insomnia.     Blood pressure 116/59, pulse 71, temperature 98.2 F (36.8 C), temperature source Oral, resp. rate 18, height 5\' 10"  (1.778 m), weight 104.327 kg (230 lb).Body mass index is 33 kg/(m^2).  General Appearance: Fairly Groomed  Engineer, water::  Fair  Speech:  Clear and Coherent  Volume:  Normal  Mood:  Anxious, Depressed and worry  Affect:  anxious worried  Thought Process:  Coherent and Goal Directed  Orientation:  Full (Time, Place, and Person)  Thought Content:  symptoms (somatic) worries concerns  Suicidal Thoughts:  No  Homicidal Thoughts:  No  Memory:  Immediate;   Fair Recent;   Fair Remote;   Fair  Judgement:  Fair  Insight:  Present and Shallow  Psychomotor Activity:  Restlessness  Concentration:  Fair  Recall:  [employer] of Knowledge:NA  Language: Fair  Akathisia:  No  Handed:    AIMS (if indicated):     Assets:  Desire for Improvement  Sleep:   Number of Hours: 4.25   Musculoskeletal: Strength & Muscle Tone: within normal limits Gait & Station: affected by the neuropathy Patient leans: N/A  Current Medications: Current Facility-Administered Medications  Medication Dose Route Frequency Provider Last Rate Last Dose  . acetaminophen (TYLENOL) tablet 650 mg  650 mg Oral Q6H PRN Benjamine Mola, FNP   650 mg at 02/18/14 1555  . ALPRAZolam Duanne Moron) tablet 0.5 mg  0.5 mg Oral BID PRN Benjamine Mola, FNP   0.5 mg at 02/16/14 0835  . alum & mag hydroxide-simeth (MAALOX/MYLANTA) 200-200-20 MG/5ML suspension 30 mL  30 mL Oral Q4H PRN Benjamine Mola, FNP      . amitriptyline (ELAVIL) tablet 50 mg  50 mg Oral QHS PRN Nicholaus Bloom, MD   50 mg at 02/17/14 2138  . diclofenac sodium (VOLTAREN) 1 % transdermal gel 1 application  1 application Topical BID PRN Benjamine Mola, FNP      . furosemide (LASIX) tablet 20 mg  20 mg Oral BID Belkys A Regalado, MD   20 mg at 02/18/14 1703  . insulin aspart (novoLOG) injection 0-9 Units  0-9 Units Subcutaneous TID WC Belkys A Regalado, MD   5 Units at 02/18/14 1703  . insulin glargine (LANTUS) injection 38 Units  38 Units Subcutaneous QHS Elmarie Shiley, MD   38 Units at 02/17/14 2137  . lactulose (CHRONULAC) 10 GM/15ML solution 30 g  30 g Oral TID Benjamine Mola, FNP   30 g at 02/18/14 1207  .  magnesium hydroxide (MILK OF MAGNESIA) suspension 30 mL  30 mL Oral Daily PRN Benjamine Mola, FNP      . oxyCODONE (Oxy IR/ROXICODONE) immediate release tablet 10 mg  10 mg Oral BID PRN Benjamine Mola, FNP   10 mg at 02/18/14 0617  . propranolol (INDERAL) tablet 20 mg  20 mg Oral Daily Benjamine Mola, FNP   20 mg at 02/18/14 2694  . rifaximin (XIFAXAN) tablet 550 mg  550 mg Oral BID Benjamine Mola, FNP   550 mg at 02/18/14 1703  . spironolactone (ALDACTONE) tablet 100 mg  100 mg Oral Daily Nishant Dhungel, MD   100 mg at 02/18/14 8546    Lab Results:  Results for orders placed during the hospital encounter of  02/14/14 (from the past 48 hour(s))  GLUCOSE, CAPILLARY     Status: Abnormal   Collection Time    02/16/14  9:41 PM      Result Value Ref Range   Glucose-Capillary 161 (*) 70 - 99 mg/dL   Comment 1 Notify RN     Comment 2 Documented in Chart    GLUCOSE, CAPILLARY     Status: Abnormal   Collection Time    02/17/14  6:12 AM      Result Value Ref Range   Glucose-Capillary 116 (*) 70 - 99 mg/dL  GLUCOSE, CAPILLARY     Status: Abnormal   Collection Time    02/17/14 12:03 PM      Result Value Ref Range   Glucose-Capillary 189 (*) 70 - 99 mg/dL  GLUCOSE, CAPILLARY     Status: Abnormal   Collection Time    02/17/14  4:47 PM      Result Value Ref Range   Glucose-Capillary 190 (*) 70 - 99 mg/dL   Comment 1 Notify RN    GLUCOSE, CAPILLARY     Status: Abnormal   Collection Time    02/17/14  8:57 PM      Result Value Ref Range   Glucose-Capillary 174 (*) 70 - 99 mg/dL  GLUCOSE, CAPILLARY     Status: Abnormal   Collection Time    02/18/14  6:12 AM      Result Value Ref Range   Glucose-Capillary 116 (*) 70 - 99 mg/dL  GLUCOSE, CAPILLARY     Status: Abnormal   Collection Time    02/18/14 11:36 AM      Result Value Ref Range   Glucose-Capillary 203 (*) 70 - 99 mg/dL  GLUCOSE, CAPILLARY     Status: Abnormal   Collection Time    02/18/14  4:49 PM      Result Value Ref Range   Glucose-Capillary 251 (*) 70 - 99 mg/dL    Physical Findings: AIMS: Facial and Oral Movements Muscles of Facial Expression: None, normal Lips and Perioral Area: None, normal Jaw: None, normal Tongue: None, normal,Extremity Movements Upper (arms, wrists, hands, fingers): None, normal Lower (legs, knees, ankles, toes): None, normal, Trunk Movements Neck, shoulders, hips: None, normal, Overall Severity Severity of abnormal movements (highest score from questions above): None, normal Incapacitation due to abnormal movements: None, normal Patient's awareness of abnormal movements (rate only patient's report): No  Awareness, Dental Status Current problems with teeth and/or dentures?: No Does patient usually wear dentures?: No  CIWA:    COWS:     Treatment Plan Summary: Daily contact with patient to assess and evaluate symptoms and progress in treatment Medication management  Plan: Supportive approach/coping skills/relapse prevention  Will pursue further stabilization  Medical Decision Making Problem Points:  Review of psycho-social stressors (1) Data Points:  Review of medication regiment & side effects (2)  I certify that inpatient services furnished can reasonably be expected to improve the patient's condition.   Kennet Mccort A 02/18/2014, 5:30 PM

## 2014-02-18 NOTE — [facility] Group Notes (Signed)
Adult Psychoeducational Group Note  Date:  02/18/2014 Time:  10:34 PM  Group Topic/Focus:  AA Meeting  Participation Level:  Did Not Attend  Participation Quality:  None  Affect:  None  Cognitive:  None  Insight: None  Engagement in Group:  None  Modes of Intervention:  Discussion and Education  Additional Comments:  Judea did not attend group.  Victorino Sparrow A 02/18/2014, 10:34 PM

## 2014-02-18 NOTE — [facility] Group Notes (Signed)
[facility] L[facility] Group Therapy      Feelings About Diagnosis 1:15 - 2:30 PM         02/18/2014    Type of Therapy:  Group Therapy  Participation Level:  Active  Participation Quality:  Appropriate  Affect:  Depressed  Cognitive:  Alert and Appropriate  Insight:  Developing/Improving and Engaged  Engagement in Therapy:  Developing/Improving and Engaged  Modes of Intervention:  Discussion, Education, Exploration, Problem-Solving, Rapport Building, Support  Summary of Progress/Problems:  Patient actively participated in group. Patient discussed past and present diagnosis and the effects it has had on life.  Patient talked about family and society being judgmental and the stigma associated with having a mental health diagnosis.  He shared having a mental health diagnosis sucks but you have to learn to deal with it or it will deal with you.  Concha Pyo 02/18/2014

## 2014-02-18 NOTE — Progress Notes (Signed)
D: Pt presents with flat affect and depressed mood. Pt has minimal interaction on the milieu. Pt has poor insight for treatment. Pt is attention seeking and somatic. Pt has been assessed by writer and v/s are wnl.  Pt denies SI/HI/AVH. Pt denies having any depression today.  A: Medications administered as ordered per MD. Verbal support given. Pt encouraged to attend groups. 15 minute checks performed for safety.  R: Pt safety maintained at this time.
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
## 2014-02-18 NOTE — Progress Notes (Signed)
Recreation Therapy Notes  Animal-Assisted Activity/Therapy (AAA/T) Program Checklist/Progress Notes Patient Eligibility Criteria Checklist & Daily Group note for Rec Tx Intervention  Date: 09.29.2015 Time: 2:45pm Location: [address]   AAA/T Program Assumption of Risk Form signed by Patient/ or Parent Legal Guardian yes  Patient is free of allergies or sever asthma yes  Patient reports no fear of animals yes  Patient reports no history of cruelty to animals yes   Patient understands his/her participation is voluntary yes  Behavioral Response: Did not attend.   Laureen Ochs Mazzy Santarelli, LRT/CTRS  Randel Hargens L 02/18/2014 4:07 PM

## 2014-02-19 DIAGNOSIS — F329 Major depressive disorder, single episode, unspecified: Secondary | ICD-10-CM

## 2014-02-19 LAB — GLUCOSE, CAPILLARY
GLUCOSE-CAPILLARY: 93 mg/dL (ref 70–99)
GLUCOSE-CAPILLARY: 219 mg/dL — AB (ref 70–99)

## 2014-02-19 MED ORDER — INSULIN ASPART 100 UNIT/ML FLEXPEN: 6.0000 [IU] | PEN_INJECTOR | Freq: Three times a day (TID) | SUBCUTANEOUS | Status: DC

## 2014-02-19 MED ORDER — AMITRIPTYLINE HCL 50 MG PO TABS: 50.0000 mg | ORAL_TABLET | Freq: Every evening | ORAL | Status: DC | PRN

## 2014-02-19 MED ORDER — PROPRANOLOL HCL 20 MG PO TABS: 20.0000 mg | ORAL_TABLET | Freq: Every day | ORAL | Status: DC

## 2014-02-19 MED ORDER — RIFAXIMIN 550 MG PO TABS: 550.0000 mg | ORAL_TABLET | Freq: Two times a day (BID) | ORAL | Status: DC

## 2014-02-19 MED ORDER — INSULIN GLARGINE 100 UNIT/ML SOLOSTAR PEN: PEN_INJECTOR | SUBCUTANEOUS | Status: DC

## 2014-02-19 MED ORDER — ALPRAZOLAM 0.5 MG PO TABS: 0.5000 mg | ORAL_TABLET | Freq: Two times a day (BID) | ORAL | Status: DC | PRN

## 2014-02-19 MED ORDER — LACTULOSE 10 GM/15ML PO SOLN: 30.0000 g | Freq: Three times a day (TID) | ORAL | Status: DC

## 2014-02-19 MED ORDER — SPIRONOLACTONE 50 MG PO TABS: ORAL_TABLET | ORAL | Status: DC

## 2014-02-19 MED ORDER — FUROSEMIDE 20 MG PO TABS: 20.0000 mg | ORAL_TABLET | Freq: Two times a day (BID) | ORAL | Status: DC

## 2014-02-19 MED ORDER — OXYCODONE HCL 10 MG PO TABS: ORAL_TABLET | ORAL | Status: DC

## 2014-02-19 MED ORDER — DICLOFENAC SODIUM 1 % TD GEL: 1.0000 "application " | Freq: Two times a day (BID) | TRANSDERMAL | Status: DC | PRN

## 2014-02-19 NOTE — Progress Notes (Signed)
Pt reports he is doing about the same.  He still presents with frequent somatic complaints.  Writer was able to engage pt in conversation about his family and grandchildren.  He did make the statement that he needed to get well so he could enjoy his grandchildren.  Pt has not c/o of pain this evening, so has not requested any pain medications.  He has c/o anxiety and received prn med.  Pt denies SI/HI/AV at this time.  Pt has attended few groups.  Pt makes his needs known to staff.  Support and encouragement offered.  Safety maintained with q15 minute checks.

## 2014-02-19 NOTE — [facility] Suicide Risk Assessment (Signed)
Suicide Risk Assessment  Discharge Assessment     Demographic Factors:  Caucasian  Total Time spent with patient: 30 minutes  Psychiatric Specialty Exam:     Blood pressure 116/55, pulse 72, temperature 98.7 F (37.1 C), temperature source Oral, resp. rate 18, height 5\' 10"  (1.778 m), weight 104.327 kg (230 lb).Body mass index is 33 kg/(m^2).  General Appearance: Fairly Groomed  Engineer, water::  Fair  Speech:  Clear and Coherent  Volume:  Normal  Mood:  Euthymic  Affect:  Appropriate  Thought Process:  Coherent and Goal Directed  Orientation:  Full (Time, Place, and Person)  Thought Content:  plans as he moves on  Suicidal Thoughts:  No  Homicidal Thoughts:  No  Memory:  Immediate;   Fair Recent;   Fair Remote;   Fair  Judgement:  Fair  Insight:  Present and Shallow  Psychomotor Activity:  Normal  Concentration:  Fair  Recall:  [employer] of Knowledge:NA  Language: Fair  Akathisia:  No  Handed:    AIMS (if indicated):     Assets:  Desire for Improvement  Sleep:  Number of Hours: 6.5    Musculoskeletal: Strength & Muscle Tone: within normal limits Gait & Station: neuropathy Patient leans: N/A   Mental Status Per Nursing Assessment::   On Admission:  Suicidal ideation indicated by patient  Current Mental Status by Physician: In full contact with reality. There are no active SI plans or intent. He states he is going to talk to his family about a stable place to live   Loss Factors: Decline in physical health  Historical Factors: NA  Risk Reduction Factors:   Sense of responsibility to family and Positive social support  Continued Clinical Symptoms:  Depression:   Comorbid alcohol abuse/dependence  Cognitive Features That Contribute To Risk:  Closed-mindedness Polarized thinking Thought constriction (tunnel vision)    Suicide Risk:  Minimal: No identifiable suicidal ideation.  Patients presenting with no risk factors but with morbid ruminations; may  be classified as minimal risk based on the severity of the depressive symptoms  Discharge Diagnoses:   AXIS I:  Major Depression Recurrent moderate  AXIS II:  No diagnosis AXIS III:   Past Medical History  Diagnosis Date  . Neuropathy   . Diabetes mellitus   . Bipolar affect, depressed   . Hypertension   . Arthritis   . Stroke     Mini stroke about 53yrs ago  . Cirrhosis   . Alcohol abuse   . Chronic pain   . Cocaine abuse   . Muscle spasm     both legs  . Encephalopathy, hepatic   . Detached retina   . COPD (chronic obstructive pulmonary disease)     emphysema  . Bronchitis   . Barrett's esophagus   . GERD (gastroesophageal reflux disease)     has ulcer  . Anemia    AXIS IV:  other psychosocial or environmental problems AXIS V:  61-70 mild symptoms  Plan Of Care/Follow-up recommendations:  Activity:  as tolerated Diet:  regular Follow up outpatient basis Is patient on multiple antipsychotic therapies at discharge:  No   Has Patient had three or more failed trials of antipsychotic monotherapy by history:  No  Recommended Plan for Multiple Antipsychotic Therapies: NA    Cyndi Montejano A 02/19/2014, 12:08 PM

## 2014-02-19 NOTE — Progress Notes (Signed)
[facility] Adult Case Management Discharge Plan :  Will you be returning to the same living situation after discharge: Yes,  Patient is returning home with family. At discharge, do you have transportation home?:Yes,  Patient advised daughter will transport him home. Do you have the ability to pay for your medications:Yes,  Patient is able to afford medications.  Release of information consent forms completed and in the chart;  Patient's signature needed at discharge.  Patient to Follow up at: Follow-up Information   Follow up with Dr. Adele Schilder - [facility] On 03/12/2014. (Wednesday, Ocfobver 21, 2015 at 10:30 AM.  Please arrive at 10 AM)    Contact information:   [place], [place]   [id number]  [phone]      Patient denies SI/HI:   Patient no longer endorsing SI/HI or other thoughts of self harm.     Safety Planning and Suicide Prevention discussed: .Reviewed with all patients during discharge planning group   Raeshaun Simson, Eulas Post 02/19/2014, 9:53 AM

## 2014-02-19 NOTE — Progress Notes (Signed)
Discharge note: Pt received both written and verbal discharge instructions. Pt verbalized understanding of discharge instructions. Pt agreed to f/u appt and med regimen. Pt denies SI/HI/AVH. Pt received sample meds, prescriptions and belongings. Pt safely left [facility].

## 2014-02-19 NOTE — Discharge Summary (Signed)
Physician Discharge Summary Note  Patient:  [name] is an 58 y.o., male MRN:  [id number] DOB:  1955-07-19 Patient phone:  [phone] (home)  Patient address:   [address] [address],  Total Time spent with patient: Greater than 30 minutes  Date of Admission:  02/14/2014 Date of Discharge: 02/19/14  Reason for Admission: Mood stabilization treatments  Discharge Diagnoses: Active Problems:   Cirrhosis   Pancytopenia   Anemia   MDD (major depressive disorder)   Psychiatric Specialty Exam: Physical Exam  Psychiatric: His speech is normal and behavior is normal. Judgment and thought content normal. His mood appears not anxious. His affect is not angry, not blunt, not labile and not inappropriate. Cognition and memory are normal. He does not exhibit a depressed mood.    Review of Systems  Constitutional: Negative.   HENT: Negative.   Eyes: Negative.   Respiratory: Negative.   Cardiovascular: Negative.   Gastrointestinal: Negative.   Genitourinary: Negative.   Musculoskeletal: Negative.   Skin: Negative.   Neurological: Negative.   Endo/Heme/Allergies: Negative.   Psychiatric/Behavioral: Positive for depression (Stabilized wih medication prior to discharge) and substance abuse ([place], hx). Negative for suicidal ideas, hallucinations and memory loss. The patient has insomnia (Stable). The patient is not nervous/anxious.     Blood pressure 116/55, pulse 72, temperature 98.7 F (37.1 C), temperature source Oral, resp. rate 18, height 5\' 10"  (1.778 m), weight 104.327 kg (230 lb).Body mass index is 33 kg/(m^2).   General Appearance: Fairly Groomed   Engineer, water:: Fair   Speech: Clear and Coherent   Volume: Normal   Mood: Euthymic   Affect: Appropriate   Thought Process: Coherent and Goal Directed   Orientation: Full (Time, Place, and Person)   Thought Content: plans as he moves on   Suicidal Thoughts: No   Homicidal Thoughts: No    Memory: Immediate; Fair  Recent; Fair  Remote; Fair   Judgement: Fair   Insight: Present and Shallow   Psychomotor Activity: Normal   Concentration: Fair   Recall: [employer] of Knowledge:NA   Language: Fair   Akathisia: No   Handed:   AIMS (if indicated):   Assets: Desire for Improvement   Sleep: Number of Hours: 6.5    Past Psychiatric History: Diagnosis: MDD (major depressive disorder)  Hospitalizations: [facility] adult unit  Outpatient Care: [facility] with Dr. Adele Schilder  Substance Abuse Care: NA  Self-Mutilation: NA  Suicidal Attempts: NA  Violent Behaviors: NA   Musculoskeletal: Strength & Muscle Tone: within normal limits Gait & Station: normal Patient leans: N/A  DSM5: Schizophrenia Disorders:  NA Obsessive-Compulsive Disorders:  NA Trauma-Stressor Disorders:  NA Substance/Addictive Disorders:  Hx alcohol abuse Depressive Disorders:  MDD (major depressive disorder)  Axis Diagnosis:  AXIS I:  MDD (major depressive disorder) AXIS II:  Deferred AXIS III:   Past Medical History  Diagnosis Date  . Neuropathy   . Diabetes mellitus   . Bipolar affect, depressed   . Hypertension   . Arthritis   . Stroke     Mini stroke about 48yrs ago  . Cirrhosis   . Alcohol abuse   . Chronic pain   . Cocaine abuse   . Muscle spasm     both legs  . Encephalopathy, hepatic   . Detached retina   . COPD (chronic obstructive pulmonary disease)     emphysema  . Bronchitis   . Barrett's esophagus   . GERD (gastroesophageal reflux  disease)     has ulcer  . Anemia    AXIS IV:  other psychosocial or environmental problems and mental illness, chronic AXIS V:  63  Level of Care:  OP  Hospital Course:  [name] is an 58 y.o. male brought in by EMS after grandson called due to being concerned about pt's health and mental health. Pt has presented to ED three other times this month with similar symptoms. Pt is drowsy but able to answer questions, speech is  sometimes circumstantial and tangential. Mood is depressed, affect congruent. Judgement partially impaired. Pt reports SI earlier today with thoughts of walking in traffic or drink antifreeze.  While a patient in this hospital, Cantrell received mood stabilization treatments. He was medicated and discharged on Amitriptyline 50 mg Q bedtime for insomnia/depression and Inderal 10 mg for anxiety/high blood pressure. He was resumed on all his pertinent home medications for his other pre-existing medical issues. He tolerated his treatment regimen without any adverse effects and or reactions. Rishith was enrolled and participated in the group counseling sessions being offered and held on this unit. He learned coping skills.  Darral responded well to his treatment regimen. This is evidenced by his reports of improved mood, presentation of good affect/eye contact and absence of suicidal ideations. He is currently being discharged to continue psychiatric care on an outpatient basis with Dr. Adele Schilder. He is provided with all the pertinent information required to make this appointment without problems. Upon discharge, he denies any SIHI, AVH, delusional thoughts and or paranoia. He left [name] with all personal belongings in no distress. Transportation per daughter.  Consults:  psychiatry  Significant Diagnostic Studies:  labs: CBC with diff, CMP, UDS, toxicology tests, U/A, results reviewed, stable  Discharge Vitals:   Blood pressure 116/55, pulse 72, temperature 98.7 F (37.1 C), temperature source Oral, resp. rate 18, height 5\' 10"  (1.778 m), weight 104.327 kg (230 lb). Body mass index is 33 kg/(m^2). Lab Results:   Results for orders placed during the hospital encounter of 02/14/14 (from the past 72 hour(s))  GLUCOSE, CAPILLARY     Status: Abnormal   Collection Time    02/16/14 11:47 AM      Result Value Ref Range   Glucose-Capillary 147 (*) 70 - 99 mg/dL  GLUCOSE, CAPILLARY     Status: Abnormal   Collection Time     02/16/14  4:55 PM      Result Value Ref Range   Glucose-Capillary 183 (*) 70 - 99 mg/dL   Comment 1 Notify RN     Comment 2 Documented in Chart    GLUCOSE, CAPILLARY     Status: Abnormal   Collection Time    02/16/14  9:41 PM      Result Value Ref Range   Glucose-Capillary 161 (*) 70 - 99 mg/dL   Comment 1 Notify RN     Comment 2 Documented in Chart    GLUCOSE, CAPILLARY     Status: Abnormal   Collection Time    02/17/14  6:12 AM      Result Value Ref Range   Glucose-Capillary 116 (*) 70 - 99 mg/dL  GLUCOSE, CAPILLARY     Status: Abnormal   Collection Time    02/17/14 12:03 PM      Result Value Ref Range   Glucose-Capillary 189 (*) 70 - 99 mg/dL  GLUCOSE, CAPILLARY     Status: Abnormal   Collection Time    02/17/14  4:47 PM  Result Value Ref Range   Glucose-Capillary 190 (*) 70 - 99 mg/dL   Comment 1 Notify RN    GLUCOSE, CAPILLARY     Status: Abnormal   Collection Time    02/17/14  8:57 PM      Result Value Ref Range   Glucose-Capillary 174 (*) 70 - 99 mg/dL  GLUCOSE, CAPILLARY     Status: Abnormal   Collection Time    02/18/14  6:12 AM      Result Value Ref Range   Glucose-Capillary 116 (*) 70 - 99 mg/dL  GLUCOSE, CAPILLARY     Status: Abnormal   Collection Time    02/18/14 11:36 AM      Result Value Ref Range   Glucose-Capillary 203 (*) 70 - 99 mg/dL  GLUCOSE, CAPILLARY     Status: Abnormal   Collection Time    02/18/14  4:49 PM      Result Value Ref Range   Glucose-Capillary 251 (*) 70 - 99 mg/dL  GLUCOSE, CAPILLARY     Status: Abnormal   Collection Time    02/18/14  9:38 PM      Result Value Ref Range   Glucose-Capillary 142 (*) 70 - 99 mg/dL  GLUCOSE, CAPILLARY     Status: None   Collection Time    02/19/14  6:08 AM      Result Value Ref Range   Glucose-Capillary 93  70 - 99 mg/dL    Physical Findings: AIMS: Facial and Oral Movements Muscles of Facial Expression: None, normal Lips and Perioral Area: None, normal Jaw: None, normal Tongue:  None, normal,Extremity Movements Upper (arms, wrists, hands, fingers): None, normal Lower (legs, knees, ankles, toes): None, normal, Trunk Movements Neck, shoulders, hips: None, normal, Overall Severity Severity of abnormal movements (highest score from questions above): None, normal Incapacitation due to abnormal movements: None, normal Patient's awareness of abnormal movements (rate only patient's report): No Awareness, Dental Status Current problems with teeth and/or dentures?: No Does patient usually wear dentures?: No  CIWA:    COWS:     Psychiatric Specialty Exam: See Psychiatric Specialty Exam and Suicide Risk Assessment completed by Attending Physician prior to discharge.  Discharge destination:  Home  Is patient on multiple antipsychotic therapies at discharge:  No   Has Patient had three or more failed trials of antipsychotic monotherapy by history:  No  Recommended Plan for Multiple Antipsychotic Therapies: NA     Medication List    STOP taking these medications       ibuprofen 200 MG tablet  Commonly known as:  ADVIL,MOTRIN      TAKE these medications     Indication   ALPRAZolam 0.5 MG tablet  Commonly known as:  XANAX  Take 1 tablet (0.5 mg total) by mouth 2 (two) times daily as needed for anxiety.   Indication:  Feeling Anxious     amitriptyline 50 MG tablet  Commonly known as:  ELAVIL  Take 1 tablet (50 mg total) by mouth at bedtime as needed for sleep.   Indication:  Trouble Sleeping     diclofenac sodium 1 % Gel  Commonly known as:  VOLTAREN  Apply 1 application topically 2 (two) times daily as needed (pain in shoulders and back.).   Indication:  Joint Damage causing Pain and Loss of Function     furosemide 20 MG tablet  Commonly known as:  LASIX  Take 1 tablet (20 mg total) by mouth 2 (two) times daily. For swelling  Indication:  Edema     insulin aspart 100 UNIT/ML FlexPen  Commonly known as:  NOVOLOG FLEXPEN  Inject 6 Units into the skin 3  (three) times daily with meals. For diabetes   Indication:  Type 2 Diabetes     Insulin Glargine 100 UNIT/ML Solostar Pen  Commonly known as:  LANTUS SOLOSTAR  Inject 38 units into the skin at bedtime: For diabetes   Indication:  Type 2 Diabetes     lactulose 10 GM/15ML solution  Commonly known as:  CHRONULAC  Take 45 mLs (30 g total) by mouth 3 (three) times daily. Titrate to at least 3 BMs daily: For constipation   Indication:  Chronic Constipation     Oxycodone HCl 10 MG Tabs  Take 2 tablets as needed daily: For pain   Indication:  Moderate to Severe Pain     propranolol 20 MG tablet  Commonly known as:  INDERAL  Take 1 tablet (20 mg total) by mouth daily. For anxiety   Indication:  Anxiety     rifaximin 550 MG Tabs tablet  Commonly known as:  XIFAXAN  Take 1 tablet (550 mg total) by mouth 2 (two) times daily. For IBS   Indication:  Irritable Bowel Syndrome     spironolactone 50 MG tablet  Commonly known as:  ALDACTONE  Tale 2 tablets (100 mg) daily: For high blood pressure   Indication:  High Blood Pressure       Follow-up Information   Follow up with Dr. Adele Schilder - [facility] On 03/12/2014. (Wednesday, Ocfobver 21, 2015 at 10:30 AM.  Please arrive at 10 AM)    Contact information:   [place], [place]   [address]  [phone]     Follow-up recommendations: Activity:  As tolerated Diet: As recommended by your primary care doctor. Keep all scheduled follow-up appointments as recommended.    Comments:  Take all your medications as prescribed by your mental healthcare provider. Report any adverse effects and or reactions from your medicines to your outpatient provider promptly. Patient is instructed and cautioned to not engage in alcohol and or illegal drug use while on prescription medicines. In the event of worsening symptoms, patient is instructed to call the crisis hotline, 911 and or go to the nearest ED for appropriate evaluation and  treatment of symptoms. Follow-up with your primary care provider for your other medical issues, concerns and or health care needs.   Total Discharge Time:  Greater than 30 minutes.  Signed: Encarnacion Slates, [place] 02/19/2014, 10:58 AM I personally assessed the patient and formulated the plan Geralyn Flash A. Sabra Heck, M.D.

## 2014-02-19 NOTE — [facility] Suicide Risk Assessment (Signed)
[place] INPATIENT:  Family/Significant Other Suicide Prevention Education  Suicide Prevention Education:  Education Completed;  Serita Butcher, Friend, [phone];  has been identified by the patient as the family member/significant other with whom the patient will be residing, and identified as the person(s) who will aid the patient in the event of a mental health crisis (suicidal ideations/suicide attempt).  With written consent from the patient, the family member/significant other has been provided the following suicide prevention education, prior to the and/or following the discharge of the patient.  The suicide prevention education provided includes the following:  Suicide risk factors  Suicide prevention and interventions  National Suicide Hotline telephone number  [facility] assessment telephone number  [facility] Emergency Assistance [place] and/or Residential Mobile Crisis Unit telephone number  Request made of family/significant other to:  Remove weapons (e.g., guns, rifles, knives), all items previously/currently identified as safety concern.   Friend advised patient does not have access to weapons.     Remove drugs/medications (over-the-counter, prescriptions, illicit drugs), all items previously/currently identified as a safety concern.  The family member/significant other verbalizes understanding of the suicide prevention education information provided.  The family member/significant other agrees to remove the items of safety concern listed above.  Concha Pyo 02/19/2014, 12:26 PM

## 2014-03-12 ENCOUNTER — Ambulatory Visit (HOSPITAL_COMMUNITY): Payer: Self-pay | Admitting: Psychiatry

## 2014-03-29 ENCOUNTER — Emergency Department (EMERGENCY_DEPARTMENT_HOSPITAL)
Admission: EM | Admit: 2014-03-29 | Discharge: 2014-03-30 | Disposition: A | Discharge status: Other Acute Inpatient Hospital | Payer: Medicare PPO | Source: Home / Self Care | Attending: Emergency Medicine | Admitting: Emergency Medicine

## 2014-03-29 ENCOUNTER — Encounter (HOSPITAL_COMMUNITY): Payer: Self-pay | Admitting: *Deleted

## 2014-03-29 DIAGNOSIS — Z8659 Personal history of other mental and behavioral disorders: Secondary | ICD-10-CM

## 2014-03-29 DIAGNOSIS — Z79899 Other long term (current) drug therapy: Secondary | ICD-10-CM | POA: Diagnosis not present

## 2014-03-29 DIAGNOSIS — J449 Chronic obstructive pulmonary disease, unspecified: Secondary | ICD-10-CM | POA: Diagnosis not present

## 2014-03-29 DIAGNOSIS — R110 Nausea: Secondary | ICD-10-CM

## 2014-03-29 DIAGNOSIS — R630 Anorexia: Secondary | ICD-10-CM

## 2014-03-29 DIAGNOSIS — K7460 Unspecified cirrhosis of liver: Secondary | ICD-10-CM

## 2014-03-29 DIAGNOSIS — Z8669 Personal history of other diseases of the nervous system and sense organs: Secondary | ICD-10-CM

## 2014-03-29 DIAGNOSIS — F17210 Nicotine dependence, cigarettes, uncomplicated: Secondary | ICD-10-CM | POA: Diagnosis not present

## 2014-03-29 DIAGNOSIS — Z720 Tobacco use: Secondary | ICD-10-CM

## 2014-03-29 DIAGNOSIS — R51 Headache: Secondary | ICD-10-CM | POA: Insufficient documentation

## 2014-03-29 DIAGNOSIS — D649 Anemia, unspecified: Secondary | ICD-10-CM

## 2014-03-29 DIAGNOSIS — M1990 Unspecified osteoarthritis, unspecified site: Secondary | ICD-10-CM

## 2014-03-29 DIAGNOSIS — Z8719 Personal history of other diseases of the digestive system: Secondary | ICD-10-CM

## 2014-03-29 DIAGNOSIS — R609 Edema, unspecified: Secondary | ICD-10-CM

## 2014-03-29 DIAGNOSIS — D61818 Other pancytopenia: Secondary | ICD-10-CM | POA: Insufficient documentation

## 2014-03-29 DIAGNOSIS — Z8673 Personal history of transient ischemic attack (TIA), and cerebral infarction without residual deficits: Secondary | ICD-10-CM | POA: Insufficient documentation

## 2014-03-29 DIAGNOSIS — R45851 Suicidal ideations: Secondary | ICD-10-CM | POA: Insufficient documentation

## 2014-03-29 DIAGNOSIS — I10 Essential (primary) hypertension: Secondary | ICD-10-CM | POA: Insufficient documentation

## 2014-03-29 DIAGNOSIS — Z792 Long term (current) use of antibiotics: Secondary | ICD-10-CM | POA: Insufficient documentation

## 2014-03-29 DIAGNOSIS — G8929 Other chronic pain: Secondary | ICD-10-CM

## 2014-03-29 DIAGNOSIS — E119 Type 2 diabetes mellitus without complications: Secondary | ICD-10-CM | POA: Insufficient documentation

## 2014-03-29 DIAGNOSIS — R109 Unspecified abdominal pain: Secondary | ICD-10-CM

## 2014-03-29 DIAGNOSIS — F338 Other recurrent depressive disorders: Secondary | ICD-10-CM | POA: Diagnosis not present

## 2014-03-29 DIAGNOSIS — K219 Gastro-esophageal reflux disease without esophagitis: Secondary | ICD-10-CM | POA: Diagnosis not present

## 2014-03-29 DIAGNOSIS — F319 Bipolar disorder, unspecified: Secondary | ICD-10-CM | POA: Diagnosis present

## 2014-03-29 DIAGNOSIS — Z794 Long term (current) use of insulin: Secondary | ICD-10-CM | POA: Diagnosis not present

## 2014-03-29 LAB — CBC WITH DIFFERENTIAL/PLATELET
BASOS PCT: 1 % (ref 0–1)
Basophils Absolute: 0 10*3/uL (ref 0.0–0.1)
EOS PCT: 11 % — AB (ref 0–5)
Eosinophils Absolute: 0.4 10*3/uL (ref 0.0–0.7)
HEMATOCRIT: 29.4 % — AB (ref 39.0–52.0)
Hemoglobin: 9.7 g/dL — ABNORMAL LOW (ref 13.0–17.0)
Lymphocytes Relative: 26 % (ref 12–46)
Lymphs Abs: 0.9 10*3/uL (ref 0.7–4.0)
MCH: 30.2 pg (ref 26.0–34.0)
MCHC: 33 g/dL (ref 30.0–36.0)
MCV: 91.6 fL (ref 78.0–100.0)
MONOS PCT: 11 % (ref 3–12)
Monocytes Absolute: 0.4 10*3/uL (ref 0.1–1.0)
NEUTROS ABS: 1.6 10*3/uL — AB (ref 1.7–7.7)
NEUTROS PCT: 51 % (ref 43–77)
Platelets: 45 10*3/uL — ABNORMAL LOW (ref 150–400)
RBC: 3.21 MIL/uL — AB (ref 4.22–5.81)
RDW: 17 % — ABNORMAL HIGH (ref 11.5–15.5)
WBC: 3.3 10*3/uL — ABNORMAL LOW (ref 4.0–10.5)

## 2014-03-29 LAB — AMMONIA: AMMONIA: 35 umol/L (ref 11–60)

## 2014-03-29 LAB — ETHANOL: Alcohol, Ethyl (B): <11 mg/dL (ref 0–11)

## 2014-03-29 LAB — COMPREHENSIVE METABOLIC PANEL
ALK PHOS: 87 U/L (ref 39–117)
ALT: 20 U/L (ref 0–53)
AST: 32 U/L (ref 0–37)
Albumin: 3 g/dL — ABNORMAL LOW (ref 3.5–5.2)
Anion gap: 10 (ref 5–15)
BUN: 16 mg/dL (ref 6–23)
CO2: 25 mEq/L (ref 19–32)
Calcium: 8.6 mg/dL (ref 8.4–10.5)
Chloride: 106 mEq/L (ref 96–112)
Creatinine, Ser: 1.84 mg/dL — ABNORMAL HIGH (ref 0.50–1.35)
GFR calc Af Amer: 45 mL/min — ABNORMAL LOW (ref >90)
GFR, EST NON AFRICAN AMERICAN: 39 mL/min — AB (ref >90)
Glucose, Bld: 164 mg/dL — ABNORMAL HIGH (ref 70–99)
POTASSIUM: 3.5 meq/L — AB (ref 3.7–5.3)
SODIUM: 141 meq/L (ref 137–147)
Total Bilirubin: 2 mg/dL — ABNORMAL HIGH (ref 0.3–1.2)
Total Protein: 6.5 g/dL (ref 6.0–8.3)

## 2014-03-29 MED ORDER — AMITRIPTYLINE HCL 25 MG PO TABS: 50.0000 mg | ORAL_TABLET | Freq: Every evening | ORAL | Status: DC | PRN

## 2014-03-29 MED ORDER — ALPRAZOLAM 0.5 MG PO TABS: 0.5000 mg | ORAL_TABLET | Freq: Two times a day (BID) | ORAL | Status: DC | PRN

## 2014-03-29 MED ORDER — NICOTINE 21 MG/24HR TD PT24: 21.0000 mg | MEDICATED_PATCH | Freq: Every day | TRANSDERMAL | Status: DC

## 2014-03-29 MED ORDER — ONDANSETRON HCL 4 MG PO TABS: 4.0000 mg | ORAL_TABLET | Freq: Three times a day (TID) | ORAL | Status: DC | PRN

## 2014-03-29 MED ORDER — RIFAXIMIN 550 MG PO TABS
550.0000 mg | ORAL_TABLET | Freq: Two times a day (BID) | ORAL | Status: DC
  Administered 2014-03-29: 550 mg via ORAL
  Filled 2014-03-29: qty 1

## 2014-03-29 MED ORDER — ALUM & MAG HYDROXIDE-SIMETH 200-200-20 MG/5ML PO SUSP: 30.0000 mL | ORAL | Status: DC | PRN

## 2014-03-29 MED ORDER — SPIRONOLACTONE 100 MG PO TABS
100.0000 mg | ORAL_TABLET | Freq: Every day | ORAL | Status: DC
  Administered 2014-03-29: 100 mg via ORAL
  Filled 2014-03-29: qty 1

## 2014-03-29 MED ORDER — DICLOFENAC SODIUM 1 % TD GEL
1.0000 "application " | Freq: Two times a day (BID) | TRANSDERMAL | Status: DC | PRN
  Filled 2014-03-29: qty 100

## 2014-03-29 MED ORDER — INSULIN GLARGINE 100 UNIT/ML ~~LOC~~ SOLN
38.0000 [IU] | Freq: Every day | SUBCUTANEOUS | Status: DC
  Administered 2014-03-29: 38 [IU] via SUBCUTANEOUS
  Filled 2014-03-29: qty 0.38

## 2014-03-29 MED ORDER — PROPRANOLOL HCL 20 MG PO TABS
20.0000 mg | ORAL_TABLET | Freq: Every day | ORAL | Status: DC
  Administered 2014-03-29: 20 mg via ORAL
  Filled 2014-03-29: qty 1

## 2014-03-29 MED ORDER — INSULIN ASPART 100 UNIT/ML ~~LOC~~ SOLN: 6.0000 [IU] | Freq: Three times a day (TID) | SUBCUTANEOUS | Status: DC

## 2014-03-29 MED ORDER — LACTULOSE 10 GM/15ML PO SOLN
30.0000 g | Freq: Three times a day (TID) | ORAL | Status: DC
  Administered 2014-03-29: 30 g via ORAL
  Filled 2014-03-29: qty 45

## 2014-03-29 MED ORDER — FUROSEMIDE 40 MG PO TABS: 20.0000 mg | ORAL_TABLET | Freq: Two times a day (BID) | ORAL | Status: DC

## 2014-03-29 MED ORDER — SODIUM CHLORIDE 0.9 % IV BOLUS (SEPSIS)
500.0000 mL | Freq: Once | INTRAVENOUS | Status: AC
  Administered 2014-03-29: 500 mL via INTRAVENOUS

## 2014-03-29 NOTE — BH Assessment (Signed)
Spoke with provider [place], [place] prior to initiating assessment. She reports pt is medically cleared for assessment. She reports reports he was having SI today with thoughts to jump in traffic, and called EMS to bring him to ED.    Assessment to commence shortly.    Lear Ng, [facility] Triage Specialist 03/29/2014 11:22 PM
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
## 2014-03-29 NOTE — ED Notes (Signed)
Pt from home with nausea and abdominal pain related to liver cirrhosis.  Pt is having SI related to the pain and depression.  Pt has not had any attempts but did have "thoughts of running out in traffic today".  Pt denies any HI.  Pt was calm and cooperative for ems

## 2014-03-29 NOTE — ED Notes (Signed)
Bed: WA07 Expected date: 03/29/14 Expected time: 7:18 PM Means of arrival: Ambulance Comments: SI/ nausea abd pain

## 2014-03-29 NOTE — ED Notes (Signed)
Patient belongins bagged  $24.11 cash (in black sock in tennis shoe) Black sweat pants Black t-shirt Tan sweater One lighter One glucose strip bottle with 2 pills in it 2 black socks One pair white shoes  Pt is still wearing black athletic shorts

## 2014-03-30 ENCOUNTER — Observation Stay (HOSPITAL_COMMUNITY)
Admission: AD | Admit: 2014-03-30 | Discharge: 2014-03-30 | Disposition: A | Discharge status: Home / Self Care | Payer: Medicare PPO | Source: Intra-hospital | Attending: Psychiatry | Admitting: Psychiatry

## 2014-03-30 ENCOUNTER — Encounter (HOSPITAL_COMMUNITY): Payer: Self-pay | Admitting: Emergency Medicine

## 2014-03-30 DIAGNOSIS — F338 Other recurrent depressive disorders: Principal | ICD-10-CM | POA: Insufficient documentation

## 2014-03-30 DIAGNOSIS — K219 Gastro-esophageal reflux disease without esophagitis: Secondary | ICD-10-CM | POA: Insufficient documentation

## 2014-03-30 DIAGNOSIS — F331 Major depressive disorder, recurrent, moderate: Secondary | ICD-10-CM

## 2014-03-30 DIAGNOSIS — F17210 Nicotine dependence, cigarettes, uncomplicated: Secondary | ICD-10-CM | POA: Insufficient documentation

## 2014-03-30 DIAGNOSIS — Z8659 Personal history of other mental and behavioral disorders: Secondary | ICD-10-CM | POA: Insufficient documentation

## 2014-03-30 DIAGNOSIS — R45851 Suicidal ideations: Secondary | ICD-10-CM | POA: Insufficient documentation

## 2014-03-30 DIAGNOSIS — Z8673 Personal history of transient ischemic attack (TIA), and cerebral infarction without residual deficits: Secondary | ICD-10-CM | POA: Insufficient documentation

## 2014-03-30 DIAGNOSIS — G629 Polyneuropathy, unspecified: Secondary | ICD-10-CM | POA: Insufficient documentation

## 2014-03-30 DIAGNOSIS — R109 Unspecified abdominal pain: Secondary | ICD-10-CM | POA: Insufficient documentation

## 2014-03-30 DIAGNOSIS — Z79899 Other long term (current) drug therapy: Secondary | ICD-10-CM | POA: Insufficient documentation

## 2014-03-30 DIAGNOSIS — H3320 Serous retinal detachment, unspecified eye: Secondary | ICD-10-CM | POA: Insufficient documentation

## 2014-03-30 DIAGNOSIS — Z794 Long term (current) use of insulin: Secondary | ICD-10-CM | POA: Insufficient documentation

## 2014-03-30 DIAGNOSIS — K7031 Alcoholic cirrhosis of liver with ascites: Secondary | ICD-10-CM | POA: Insufficient documentation

## 2014-03-30 DIAGNOSIS — G8929 Other chronic pain: Secondary | ICD-10-CM | POA: Insufficient documentation

## 2014-03-30 DIAGNOSIS — R110 Nausea: Secondary | ICD-10-CM | POA: Insufficient documentation

## 2014-03-30 DIAGNOSIS — E119 Type 2 diabetes mellitus without complications: Secondary | ICD-10-CM | POA: Insufficient documentation

## 2014-03-30 DIAGNOSIS — J449 Chronic obstructive pulmonary disease, unspecified: Secondary | ICD-10-CM | POA: Insufficient documentation

## 2014-03-30 DIAGNOSIS — I10 Essential (primary) hypertension: Secondary | ICD-10-CM | POA: Insufficient documentation

## 2014-03-30 LAB — GLUCOSE, CAPILLARY: Glucose-Capillary: 161 mg/dL — ABNORMAL HIGH (ref 70–99)

## 2014-03-30 MED ORDER — ALUM & MAG HYDROXIDE-SIMETH 200-200-20 MG/5ML PO SUSP: 30.0000 mL | ORAL | Status: DC | PRN

## 2014-03-30 MED ORDER — ONDANSETRON HCL 4 MG PO TABS: 4.0000 mg | ORAL_TABLET | Freq: Three times a day (TID) | ORAL | Status: DC | PRN

## 2014-03-30 MED ORDER — LACTULOSE 10 GM/15ML PO SOLN
30.0000 g | Freq: Three times a day (TID) | ORAL | Status: DC
  Administered 2014-03-30 (×2): 30 g via ORAL
  Filled 2014-03-30 (×4): qty 45

## 2014-03-30 MED ORDER — ALPRAZOLAM 0.25 MG PO TABS
0.5000 mg | ORAL_TABLET | Freq: Two times a day (BID) | ORAL | Status: DC | PRN
Start: 2014-03-30 — End: 2014-03-30

## 2014-03-30 MED ORDER — MAGNESIUM HYDROXIDE 400 MG/5ML PO SUSP: 30.0000 mL | Freq: Every day | ORAL | Status: DC | PRN

## 2014-03-30 MED ORDER — INSULIN ASPART 100 UNIT/ML ~~LOC~~ SOLN
6.0000 [IU] | Freq: Three times a day (TID) | SUBCUTANEOUS | Status: DC
Start: 2014-03-30 — End: 2014-03-30
  Administered 2014-03-30 (×2): 6 [IU] via SUBCUTANEOUS

## 2014-03-30 MED ORDER — INSULIN GLARGINE 100 UNIT/ML ~~LOC~~ SOLN: 38.0000 [IU] | Freq: Every day | SUBCUTANEOUS | Status: DC

## 2014-03-30 MED ORDER — RIFAXIMIN 550 MG PO TABS
550.0000 mg | ORAL_TABLET | Freq: Two times a day (BID) | ORAL | Status: DC
  Administered 2014-03-30: 550 mg via ORAL
  Filled 2014-03-30 (×3): qty 1

## 2014-03-30 MED ORDER — DICLOFENAC SODIUM 1 % TD GEL: 1.0000 "application " | Freq: Two times a day (BID) | TRANSDERMAL | Status: DC | PRN

## 2014-03-30 MED ORDER — SPIRONOLACTONE 100 MG PO TABS
100.0000 mg | ORAL_TABLET | Freq: Every day | ORAL | Status: DC
  Administered 2014-03-30: 100 mg via ORAL
  Filled 2014-03-30 (×2): qty 1

## 2014-03-30 MED ORDER — NICOTINE 21 MG/24HR TD PT24
21.0000 mg | MEDICATED_PATCH | Freq: Every day | TRANSDERMAL | Status: DC
  Administered 2014-03-30: 21 mg via TRANSDERMAL
  Filled 2014-03-30 (×3): qty 1

## 2014-03-30 MED ORDER — AMITRIPTYLINE HCL 25 MG PO TABS
50.0000 mg | ORAL_TABLET | Freq: Every evening | ORAL | Status: DC | PRN
Start: 2014-03-30 — End: 2014-03-30

## 2014-03-30 MED ORDER — PROPRANOLOL HCL 20 MG PO TABS
20.0000 mg | ORAL_TABLET | Freq: Every day | ORAL | Status: DC
  Administered 2014-03-30: 20 mg via ORAL
  Filled 2014-03-30 (×2): qty 1

## 2014-03-30 MED ORDER — FUROSEMIDE 20 MG PO TABS
20.0000 mg | ORAL_TABLET | Freq: Two times a day (BID) | ORAL | Status: DC
  Administered 2014-03-30: 20 mg via ORAL
  Filled 2014-03-30 (×4): qty 1

## 2014-03-30 MED ORDER — IBUPROFEN 600 MG PO TABS
600.0000 mg | ORAL_TABLET | Freq: Four times a day (QID) | ORAL | Status: DC | PRN
  Administered 2014-03-30: 600 mg via ORAL
  Filled 2014-03-30: qty 1

## 2014-03-30 MED ORDER — ACETAMINOPHEN 325 MG PO TABS: 650.0000 mg | ORAL_TABLET | Freq: Four times a day (QID) | ORAL | Status: DC | PRN

## 2014-03-30 NOTE — BH Assessment (Addendum)
Relayed results of assessment to Darlyne Russian, [place]. Per Darlyne Russian PA pt can be admitted to Observation unit.   [place], [facility] to obtain bed assignment, per [facility] pt can be placed in bed 4 under the care of Dr. Lorrin Mais.   Informed Mercedes, PA about recommendations.  Informed Dorian Pod, RN of plan.  Discussed plan with pt and obtained support paperwork, faxed copy to [facility] and provided RN with originals to send with pt via [place].   Pt to be transported to [place] unit via [place], [facility] Triage Specialist 03/30/2014 12:02 AM
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
## 2014-03-30 NOTE — ED Notes (Signed)
[facility] present to transport patient. Patient ambulatory upon d/c in NAD.
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
## 2014-03-30 NOTE — Progress Notes (Signed)
Patient ID: [name], male   DOB: 1955-07-18, 58 y.o.   MRN: [id number] Shift note: D: pt has been very lethargic this am. He did take all of his am medications without any adverse effects. He is soft spoke and complained of a general body ache. He initially ask for percocet and I told him that our extenders will need to review his individual situation before they would order opiate medications. RN did order, thru the extender some ibuprofen for they pt. Once cleared by the Rx will administer the ibuprofen for the general body ache. He had no other complaints. He denied any si/hi/av presently. Staff will continue to monitor pt and he remains in the observation area.

## 2014-03-30 NOTE — Progress Notes (Signed)
Patient ID: [name], male   DOB: 1955/11/13, 58 y.o.   MRN: [id number] Discharge note: Pt denied any si/hi/av and is ready for discharge. His d/c order was written and confirmed, as well as his suicide risk completed by rn. Pt f/u consisted of f/u with his pcp and his psychiatrist. He stated he understood his d/c instructions and signed for his belongings. He was given a bus pass, and stated he called his friend/relative for transporting him home.
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
## 2014-03-30 NOTE — Plan of Care (Signed)
[place] Observation Crisis Plan  Reason for Crisis Plan:  Crisis Stabilization   Plan of Care:  Referral for Inpatient Hospitalization  Family Support:      Current Living Environment:  Living Arrangements: Children  Insurance:   Hospital Account    Name Acct ID Class Status Primary Coverage   [name], [name] [id number] [place] PPO        Guarantor Account (for Hospital Account [id number])    Name Relation to [name]? Acct Type   [name]   Address Phone       [address] [place], [place] [address] [phone](H)          Coverage Information (for Hospital Account [id number])    F/O Payor/Plan Precert #   [facility] MEDICARE CHOICE PPO    Subscriber Subscriber #   [name] [id number]   Address Phone   [address] [place] [address] [phone]      Legal Guardian:   self   Primary Care Provider:  Barbette Merino, Baker  Current Outpatient Providers:  NA  Psychiatrist:   NA  Counselor/Therapist:   NA  Compliant with Medications:  Yes  Additional Information:   [name] 11/8/20152:52 AM
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
## 2014-03-30 NOTE — Progress Notes (Signed)
[place] INPATIENT:  Family/Significant Other Suicide Prevention Education  Suicide Prevention Education:  Patient Refusal for Family/Significant Other Suicide Prevention Education: The patient [name] has refused to provide written consent for family/significant other to be provided Family/Significant Other Suicide Prevention Education during admission and/or prior to discharge.  Physician notified.  [name] 03/30/2014, 2:52 AM

## 2014-03-31 LAB — GLUCOSE, CAPILLARY: Glucose-Capillary: 133 mg/dL — ABNORMAL HIGH (ref 70–99)

## 2014-04-04 ENCOUNTER — Other Ambulatory Visit: Payer: Self-pay | Admitting: Gastroenterology

## 2014-04-04 DIAGNOSIS — K7030 Alcoholic cirrhosis of liver without ascites: Secondary | ICD-10-CM

## 2014-04-14 ENCOUNTER — Other Ambulatory Visit: Payer: Self-pay

## 2014-04-14 ENCOUNTER — Ambulatory Visit
Admission: RE | Admit: 2014-04-14 | Discharge: 2014-04-14 | Disposition: A | Discharge status: Home / Self Care | Payer: Medicare PPO | Source: Ambulatory Visit | Attending: Gastroenterology | Admitting: Gastroenterology

## 2014-04-14 DIAGNOSIS — K7030 Alcoholic cirrhosis of liver without ascites: Secondary | ICD-10-CM

## 2014-05-26 ENCOUNTER — Emergency Department (HOSPITAL_COMMUNITY)
Admission: EM | Admit: 2014-05-26 | Discharge: 2014-05-26 | Disposition: A | Discharge status: Home / Self Care | Payer: Medicare PPO | Attending: Emergency Medicine | Admitting: Emergency Medicine

## 2014-05-26 ENCOUNTER — Encounter (HOSPITAL_COMMUNITY): Payer: Self-pay | Admitting: Cardiology

## 2014-05-26 DIAGNOSIS — R42 Dizziness and giddiness: Secondary | ICD-10-CM | POA: Insufficient documentation

## 2014-05-26 DIAGNOSIS — Z8659 Personal history of other mental and behavioral disorders: Secondary | ICD-10-CM | POA: Diagnosis not present

## 2014-05-26 DIAGNOSIS — Z792 Long term (current) use of antibiotics: Secondary | ICD-10-CM | POA: Insufficient documentation

## 2014-05-26 DIAGNOSIS — J449 Chronic obstructive pulmonary disease, unspecified: Secondary | ICD-10-CM | POA: Diagnosis not present

## 2014-05-26 DIAGNOSIS — R531 Weakness: Secondary | ICD-10-CM | POA: Diagnosis present

## 2014-05-26 DIAGNOSIS — G8929 Other chronic pain: Secondary | ICD-10-CM | POA: Diagnosis not present

## 2014-05-26 DIAGNOSIS — Z720 Tobacco use: Secondary | ICD-10-CM | POA: Diagnosis not present

## 2014-05-26 DIAGNOSIS — Z79899 Other long term (current) drug therapy: Secondary | ICD-10-CM | POA: Diagnosis not present

## 2014-05-26 DIAGNOSIS — Z8673 Personal history of transient ischemic attack (TIA), and cerebral infarction without residual deficits: Secondary | ICD-10-CM | POA: Insufficient documentation

## 2014-05-26 DIAGNOSIS — Z79891 Long term (current) use of opiate analgesic: Secondary | ICD-10-CM | POA: Diagnosis not present

## 2014-05-26 DIAGNOSIS — R001 Bradycardia, unspecified: Secondary | ICD-10-CM

## 2014-05-26 DIAGNOSIS — E119 Type 2 diabetes mellitus without complications: Secondary | ICD-10-CM | POA: Insufficient documentation

## 2014-05-26 DIAGNOSIS — M1990 Unspecified osteoarthritis, unspecified site: Secondary | ICD-10-CM | POA: Insufficient documentation

## 2014-05-26 DIAGNOSIS — Z791 Long term (current) use of non-steroidal anti-inflammatories (NSAID): Secondary | ICD-10-CM | POA: Insufficient documentation

## 2014-05-26 DIAGNOSIS — K7290 Hepatic failure, unspecified without coma: Secondary | ICD-10-CM | POA: Insufficient documentation

## 2014-05-26 DIAGNOSIS — R630 Anorexia: Secondary | ICD-10-CM | POA: Diagnosis not present

## 2014-05-26 DIAGNOSIS — R5383 Other fatigue: Secondary | ICD-10-CM | POA: Diagnosis not present

## 2014-05-26 DIAGNOSIS — I10 Essential (primary) hypertension: Secondary | ICD-10-CM | POA: Insufficient documentation

## 2014-05-26 DIAGNOSIS — Z794 Long term (current) use of insulin: Secondary | ICD-10-CM | POA: Insufficient documentation

## 2014-05-26 LAB — CBC
HCT: 31.3 % — ABNORMAL LOW (ref 39.0–52.0)
HEMOGLOBIN: 10.5 g/dL — AB (ref 13.0–17.0)
MCH: 31.2 pg (ref 26.0–34.0)
MCHC: 33.5 g/dL (ref 30.0–36.0)
MCV: 92.9 fL (ref 78.0–100.0)
PLATELETS: 59 10*3/uL — AB (ref 150–400)
RBC: 3.37 MIL/uL — AB (ref 4.22–5.81)
RDW: 17.3 % — ABNORMAL HIGH (ref 11.5–15.5)
WBC: 3.5 10*3/uL — AB (ref 4.0–10.5)

## 2014-05-26 LAB — BASIC METABOLIC PANEL
Anion gap: 4 — ABNORMAL LOW (ref 5–15)
BUN: 11 mg/dL (ref 6–23)
CALCIUM: 8.3 mg/dL — AB (ref 8.4–10.5)
CHLORIDE: 111 meq/L (ref 96–112)
CO2: 22 mmol/L (ref 19–32)
Creatinine, Ser: 1.04 mg/dL (ref 0.50–1.35)
GFR calc Af Amer: 90 mL/min — ABNORMAL LOW (ref >90)
GFR calc non Af Amer: 77 mL/min — ABNORMAL LOW (ref >90)
GLUCOSE: 79 mg/dL (ref 70–99)
POTASSIUM: 3.9 mmol/L (ref 3.5–5.1)
Sodium: 137 mmol/L (ref 135–145)

## 2014-05-26 LAB — I-STAT TROPONIN, ED: Troponin i, poc: 0 ng/mL (ref 0.00–0.08)

## 2014-05-26 LAB — CBG MONITORING, ED
Glucose-Capillary: 72 mg/dL (ref 70–99)
Glucose-Capillary: 118 mg/dL — ABNORMAL HIGH (ref 70–99)

## 2014-05-26 LAB — PROTIME-INR
INR: 1.37 (ref 0.00–1.49)
PROTHROMBIN TIME: 17 s — AB (ref 11.6–15.2)

## 2014-05-26 NOTE — Discharge Instructions (Signed)
PLEASE HOLD YOUR INDERAL FOR THE NEXT TWO DAYS AND CALL YOUR PRIMARY DOCTOR FOR RECHECK.  YOUR HEART RATE SLIGHTLY LOW AND NEEDS TO BE RECHECKED Please hold your insulin tonight and recheck blood sugar in the morning.  If it is elevated you can restart your insulin tomorrow   Your caregiver has seen you today because you are having problems with feelings of weakness, dizziness, and/or fatigue. Weakness has many different causes, some of which are common and others are very rare. Your caregiver has considered some of the most common causes of weakness and feels it is safe for you to go home and be observed. Not every illness or injury can be identified during an emergency department visit, thus follow-up with your primary healthcare provider is important. Medical conditions can also worsen, so it is also important to return immediately as directed below, or if you have other serious concerns develop.  RETURN IMMEDIATELY IF  you develop new shortness of breath, chest pain, fever, have difficulty moving parts of your body (new weakness, numbness, or incoordination), sudden change in speech, vision, swallowing, or understanding, faint or develop new dizziness, severe headache, become poorly responsive or have an altered mental status compared to baseline for you, new rash, abdominal pain, or bloody stools,  Return sooner also if you develop new problems for which you have not talked to your caregiver but you feel may be emergency medical conditions.

## 2014-05-26 NOTE — ED Provider Notes (Signed)
CSN: [id number]     Arrival date & time 05/26/14  1433 History   First MD Initiated Contact with Patient 05/26/14 1628     Chief Complaint  Patient presents with  . Weakness    Patient is a 59 y.o. male presenting with dizziness. The history is provided by the patient.  Dizziness Quality:  Unable to specify Severity:  Mild Onset quality:  Gradual Timing:  Intermittent Progression:  Unchanged Chronicity:  New Relieved by:  None tried Worsened by:  Nothing tried Associated symptoms: no chest pain, no headaches, no shortness of breath, no syncope, no vomiting and no weakness   Patient reports he went to PCP office today for medication refill and was told his blood glucose and was low and he was sent for evaluation  He reports mild dizziness over past several days He denies HA/CP/SOB/weakness No abd pain He reports decreased appetite but otherwise feels at baseline     Past Medical History  Diagnosis Date  . Neuropathy   . Diabetes mellitus   . Bipolar affect, depressed   . Hypertension   . Arthritis   . Stroke     Mini stroke about 72yrs ago  . Cirrhosis   . Alcohol abuse   . Chronic pain   . Cocaine abuse   . Muscle spasm     both legs  . Encephalopathy, hepatic   . Detached retina   . COPD (chronic obstructive pulmonary disease)     emphysema  . Bronchitis   . Barrett's esophagus   . GERD (gastroesophageal reflux disease)     has ulcer  . Anemia    Past Surgical History  Procedure Laterality Date  . Fracture surgery      Leg and arm 55yrs ago  . Esophagogastroduodenoscopy  04/04/2012    Procedure: ESOPHAGOGASTRODUODENOSCOPY (EGD);  Surgeon: Irene Shipper, MD;  Location: [facility] ENDOSCOPY;  Service: Endoscopy;  Laterality: N/A;  . Esophagogastroduodenoscopy Left 03/13/2013    Procedure: ESOPHAGOGASTRODUODENOSCOPY (EGD);  Surgeon: Arta Silence, MD;  Location: [facility] ENDOSCOPY;  Service: Endoscopy;  Laterality: Left;  Marland Kitchen Eye surgery  8 months ago both eyes    cataracts  both eyes, detached eye, gas pocket  . Vasectomy    . Pars plana vitrectomy Left 07/08/2013    Procedure: PARS PLANA VITRECTOMY WITH 25 GAUGE;  Surgeon: Hurman Horn, MD;  Location: [place];  Service: Ophthalmology;  Laterality: Left;  . Intraocular lens removal Left 07/08/2013    Procedure: REMOVAL OF INTRAOCULAR LENS;  Surgeon: Hurman Horn, MD;  Location: [place];  Service: Ophthalmology;  Laterality: Left;  . Placement and suture of secondary intraocular lens Left 07/08/2013    Procedure: PLACEMENT AND SUTURE OF SECONDARY INTRAOCULAR LENS;  Surgeon: Hurman Horn, MD;  Location: [place];  Service: Ophthalmology;  Laterality: Left;  Insertion of Anterior Capsule Intraocular Lens   . Esophagogastroduodenoscopy N/A 01/16/2014    Procedure: ESOPHAGOGASTRODUODENOSCOPY (EGD);  Surgeon: Winfield Cunas., MD;  Location: [facility] ENDOSCOPY;  Service: Endoscopy;  Laterality: N/A;  . Colonoscopy N/A 01/17/2014    Procedure: COLONOSCOPY;  Surgeon: Winfield Cunas., MD;  Location: [facility] ENDOSCOPY;  Service: Endoscopy;  Laterality: N/A;  possible banding   Family History  Problem Relation Age of Onset  . Hypotension Mother    History  Substance Use Topics  . Smoking status: Current Every Day Smoker -- 1.00 packs/day for 30 years    Types: Cigarettes    Last Attempt to Quit: 04/06/2012  .  Smokeless tobacco: Never Used     Comment: quit   . Alcohol Use: 0.0 oz/week     Comment: 12 pk beer daily  06/2013 - no alcohol since 11/2012    Review of Systems  Constitutional: Positive for appetite change and fatigue. Negative for fever.  Respiratory: Negative for shortness of breath.   Cardiovascular: Negative for chest pain and syncope.  Gastrointestinal: Negative for vomiting.  Neurological: Positive for dizziness. Negative for syncope and headaches.  All other systems reviewed and are negative.     Allergies  Review of patient's allergies indicates no known allergies.  Home Medications   Prior to  Admission medications   Medication Sig Start Date End Date Taking? Authorizing Provider  diclofenac sodium (VOLTAREN) 1 % GEL Apply 1 application topically 2 (two) times daily as needed (pain in shoulders and back.). 02/19/14  Yes Encarnacion Slates, NP  furosemide (LASIX) 20 MG tablet Take 1 tablet (20 mg total) by mouth 2 (two) times daily. For swelling 02/19/14  Yes Encarnacion Slates, NP  insulin aspart (NOVOLOG FLEXPEN) 100 UNIT/ML FlexPen Inject 6 Units into the skin 3 (three) times daily with meals. For diabetes 02/19/14  Yes Encarnacion Slates, NP  Insulin Glargine (LANTUS SOLOSTAR) 100 UNIT/ML Solostar Pen Inject 38 units into the skin at bedtime: For diabetes 02/19/14  Yes Encarnacion Slates, NP  lactulose (CHRONULAC) 10 GM/15ML solution Take 45 mLs (30 g total) by mouth 3 (three) times daily. Titrate to at least 3 BMs daily: For constipation 02/19/14  Yes Encarnacion Slates, NP  Oxycodone HCl 10 MG TABS Take 2 tablets as needed daily: For pain 02/19/14  Yes Encarnacion Slates, NP  rifaximin (XIFAXAN) 550 MG TABS tablet Take 1 tablet (550 mg total) by mouth 2 (two) times daily. For IBS 02/19/14  Yes Encarnacion Slates, NP  spironolactone (ALDACTONE) 50 MG tablet Tale 2 tablets (100 mg) daily: For high blood pressure Patient taking differently: Take 100 mg by mouth daily.  02/19/14  Yes Encarnacion Slates, NP   BP 146/55 mmHg  Pulse 58  Temp(Src) 97.5 F (36.4 C) (Oral)  Resp 18  Ht 5\' 11"  (1.803 m)  Wt 211 lb (95.709 kg)  BMI 29.44 kg/m2  SpO2 100% Physical Exam CONSTITUTIONAL: Well developed/well nourished, watching TV HEAD: Normocephalic/atraumatic EYES: EOMI/PERRL ENMT: Mucous membranes moist, poor dentition NECK: supple no meningeal signs SPINE/BACK:entire spine nontender CV: S1/S2 noted, no murmurs/rubs/gallops noted LUNGS: Lungs are clear to auscultation bilaterally, no apparent distress ABDOMEN: soft, nontender, no rebound or guarding, bowel sounds noted throughout abdomen GU:no cva tenderness NEURO: Pt is  awake/alert/appropriate, moves all extremitiesx4.  No facial droop.   EXTREMITIES: pulses normal/equal, full ROM SKIN: warm, color normal PSYCH: no abnormalities of mood noted, alert and oriented to situation  ED Course  Procedures   5:48 PM Pt well appearing He reports only came to the ED because PCP office referred due to low blood glucose He reports mild dizziness and decreased appetite but no other acute issues Will give PO and reassess I tried to call the nurse that sent patient to ED [phone]) but no answer as after hours Also - will need hold his propanolol due to mild bradycardia   Pt felt improved and requested d/c home He was ambulatory without difficulty He is well appearing Advised need to hold propanolol as well as monitor glucose at home and hold next dose of insulin and recheck tomorrow BP 143/78 mmHg  Pulse 59  Temp(Src)  97.5 F (36.4 C) (Oral)  Resp 17  Ht 5\' 11"  (1.803 m)  Wt 211 lb (95.709 kg)  BMI 29.44 kg/m2  SpO2 100%  Labs Review Labs Reviewed  CBC - Abnormal; Notable for the following:    WBC 3.5 (*)    RBC 3.37 (*)    Hemoglobin 10.5 (*)    HCT 31.3 (*)    RDW 17.3 (*)    Platelets 59 (*)    All other components within normal limits  BASIC METABOLIC PANEL - Abnormal; Notable for the following:    Calcium 8.3 (*)    GFR calc non Af Amer 77 (*)    GFR calc Af Amer 90 (*)    Anion gap 4 (*)    All other components within normal limits  PROTIME-INR - Abnormal; Notable for the following:    Prothrombin Time 17.0 (*)    All other components within normal limits  CBG MONITORING, ED - Abnormal; Notable for the following:    Glucose-Capillary 118 (*)    All other components within normal limits  CBG MONITORING, ED  Randolm Idol, ED     EKG Interpretation   Date/Time:  Monday May 26 2014 14:48:51 EST Ventricular Rate:  50 PR Interval:  170 QRS Duration: 92 QT Interval:  472 QTC Calculation: 430 R Axis:   -12 Text  Interpretation:  Sinus bradycardia Left ventricular hypertrophy  Abnormal ECG T wave inversion changes noted from prior Confirmed by  Christy Gentles  MD, Elenore Rota ([id number]) on 05/26/2014 4:29:38 PM      MDM   Final diagnoses:  Other fatigue  Bradycardia    Nursing notes including past medical history and social history reviewed and considered in documentation Labs/vital reviewed myself and considered during evaluation     Sharyon Cable, MD 05/26/14 2002

## 2014-05-26 NOTE — ED Notes (Addendum)
Pt has c/o fatigue. Denies h/a, chest pain, diaphoresis, lightheadedness, dizziness, limited ROM, n/v/d. Pt states he hasn't eaten anything in two days rt loss of appetite. Pt states he fell about 3 months ago rt bleeding ulcer that caused him to get lightheaded, vomit blood and fall down.

## 2014-05-26 NOTE — ED Notes (Signed)
Pt reports that he went to his PCP office for refills and had his CBG checked and it was low. States that he has felt weak and dizzy for the past couple of days.

## 2014-05-26 NOTE — ED Notes (Signed)
Pt is stable upon d/c and refuses the use of a wheelchair. Pt verbalizes understanding rt d/c instructions.

## 2014-06-01 ENCOUNTER — Other Ambulatory Visit: Payer: Self-pay | Admitting: Internal Medicine

## 2014-06-26 ENCOUNTER — Other Ambulatory Visit: Payer: Self-pay | Admitting: Internal Medicine

## 2014-07-28 ENCOUNTER — Inpatient Hospital Stay (HOSPITAL_COMMUNITY)
Admission: EM | Admit: 2014-07-28 | Discharge: 2014-08-01 | DRG: 433 | Disposition: A | Discharge status: Home / Self Care | Payer: Medicare PPO | Attending: Internal Medicine | Admitting: Internal Medicine

## 2014-07-28 ENCOUNTER — Emergency Department (HOSPITAL_COMMUNITY): Payer: Medicare PPO

## 2014-07-28 ENCOUNTER — Encounter (HOSPITAL_COMMUNITY): Payer: Self-pay | Admitting: Emergency Medicine

## 2014-07-28 DIAGNOSIS — K7682 Hepatic encephalopathy: Secondary | ICD-10-CM | POA: Diagnosis present

## 2014-07-28 DIAGNOSIS — Z794 Long term (current) use of insulin: Secondary | ICD-10-CM | POA: Diagnosis not present

## 2014-07-28 DIAGNOSIS — N179 Acute kidney failure, unspecified: Secondary | ICD-10-CM | POA: Diagnosis present

## 2014-07-28 DIAGNOSIS — D61818 Other pancytopenia: Secondary | ICD-10-CM | POA: Diagnosis present

## 2014-07-28 DIAGNOSIS — F17210 Nicotine dependence, cigarettes, uncomplicated: Secondary | ICD-10-CM | POA: Diagnosis present

## 2014-07-28 DIAGNOSIS — J449 Chronic obstructive pulmonary disease, unspecified: Secondary | ICD-10-CM | POA: Diagnosis present

## 2014-07-28 DIAGNOSIS — F1410 Cocaine abuse, uncomplicated: Secondary | ICD-10-CM | POA: Diagnosis present

## 2014-07-28 DIAGNOSIS — N289 Disorder of kidney and ureter, unspecified: Secondary | ICD-10-CM

## 2014-07-28 DIAGNOSIS — I10 Essential (primary) hypertension: Secondary | ICD-10-CM | POA: Diagnosis present

## 2014-07-28 DIAGNOSIS — Z8673 Personal history of transient ischemic attack (TIA), and cerebral infarction without residual deficits: Secondary | ICD-10-CM | POA: Diagnosis not present

## 2014-07-28 DIAGNOSIS — R52 Pain, unspecified: Secondary | ICD-10-CM

## 2014-07-28 DIAGNOSIS — E119 Type 2 diabetes mellitus without complications: Secondary | ICD-10-CM | POA: Diagnosis present

## 2014-07-28 DIAGNOSIS — K7030 Alcoholic cirrhosis of liver without ascites: Secondary | ICD-10-CM | POA: Diagnosis not present

## 2014-07-28 DIAGNOSIS — E875 Hyperkalemia: Secondary | ICD-10-CM

## 2014-07-28 DIAGNOSIS — G9340 Encephalopathy, unspecified: Secondary | ICD-10-CM | POA: Diagnosis present

## 2014-07-28 DIAGNOSIS — K7290 Hepatic failure, unspecified without coma: Secondary | ICD-10-CM | POA: Diagnosis present

## 2014-07-28 DIAGNOSIS — Y92009 Unspecified place in unspecified non-institutional (private) residence as the place of occurrence of the external cause: Secondary | ICD-10-CM

## 2014-07-28 DIAGNOSIS — K219 Gastro-esophageal reflux disease without esophagitis: Secondary | ICD-10-CM | POA: Diagnosis present

## 2014-07-28 DIAGNOSIS — W19XXXA Unspecified fall, initial encounter: Secondary | ICD-10-CM

## 2014-07-28 DIAGNOSIS — M1990 Unspecified osteoarthritis, unspecified site: Secondary | ICD-10-CM | POA: Diagnosis present

## 2014-07-28 DIAGNOSIS — F1010 Alcohol abuse, uncomplicated: Secondary | ICD-10-CM | POA: Diagnosis present

## 2014-07-28 DIAGNOSIS — K7460 Unspecified cirrhosis of liver: Secondary | ICD-10-CM | POA: Diagnosis present

## 2014-07-28 LAB — DIFFERENTIAL
BASOS ABS: 0 10*3/uL (ref 0.0–0.1)
BASOS PCT: 0 % (ref 0–1)
EOS ABS: 0.1 10*3/uL (ref 0.0–0.7)
Eosinophils Relative: 5 % (ref 0–5)
LYMPHS ABS: 0.5 10*3/uL — AB (ref 0.7–4.0)
Lymphocytes Relative: 20 % (ref 12–46)
Monocytes Absolute: 0.3 10*3/uL (ref 0.1–1.0)
Monocytes Relative: 9 % (ref 3–12)
Neutro Abs: 1.8 10*3/uL (ref 1.7–7.7)
Neutrophils Relative %: 66 % (ref 43–77)

## 2014-07-28 LAB — COMPREHENSIVE METABOLIC PANEL
ALT: 26 U/L (ref 0–53)
AST: 75 U/L — ABNORMAL HIGH (ref 0–37)
Albumin: 2.7 g/dL — ABNORMAL LOW (ref 3.5–5.2)
Alkaline Phosphatase: 87 U/L (ref 39–117)
Anion gap: 8 (ref 5–15)
BUN: 17 mg/dL (ref 6–23)
CHLORIDE: 107 mmol/L (ref 96–112)
CO2: 20 mmol/L (ref 19–32)
CREATININE: 1.39 mg/dL — AB (ref 0.50–1.35)
Calcium: 8.4 mg/dL (ref 8.4–10.5)
GFR calc Af Amer: 63 mL/min — ABNORMAL LOW (ref >90)
GFR calc non Af Amer: 54 mL/min — ABNORMAL LOW (ref >90)
Glucose, Bld: 111 mg/dL — ABNORMAL HIGH (ref 70–99)
Potassium: 5.7 mmol/L — ABNORMAL HIGH (ref 3.5–5.1)
Sodium: 135 mmol/L (ref 135–145)
Total Bilirubin: 3.4 mg/dL — ABNORMAL HIGH (ref 0.3–1.2)
Total Protein: 5.6 g/dL — ABNORMAL LOW (ref 6.0–8.3)

## 2014-07-28 LAB — CBC
HCT: 29.5 % — ABNORMAL LOW (ref 39.0–52.0)
Hemoglobin: 10.4 g/dL — ABNORMAL LOW (ref 13.0–17.0)
MCH: 32.3 pg (ref 26.0–34.0)
MCHC: 35.3 g/dL (ref 30.0–36.0)
MCV: 91.6 fL (ref 78.0–100.0)
PLATELETS: 57 10*3/uL — AB (ref 150–400)
RBC: 3.22 MIL/uL — AB (ref 4.22–5.81)
RDW: 17.3 % — ABNORMAL HIGH (ref 11.5–15.5)
WBC: 2.8 10*3/uL — AB (ref 4.0–10.5)

## 2014-07-28 LAB — URINALYSIS, ROUTINE W REFLEX MICROSCOPIC
Bilirubin Urine: NEGATIVE
Glucose, UA: NEGATIVE mg/dL
Hgb urine dipstick: NEGATIVE
KETONES UR: NEGATIVE mg/dL
Leukocytes, UA: NEGATIVE
NITRITE: NEGATIVE
Protein, ur: NEGATIVE mg/dL
SPECIFIC GRAVITY, URINE: 1.012 (ref 1.005–1.030)
Urobilinogen, UA: 0.2 mg/dL (ref 0.0–1.0)
pH: 5 (ref 5.0–8.0)

## 2014-07-28 LAB — PROTIME-INR
INR: 1.66 — ABNORMAL HIGH (ref 0.00–1.49)
Prothrombin Time: 19.8 seconds — ABNORMAL HIGH (ref 11.6–15.2)

## 2014-07-28 LAB — CBG MONITORING, ED: GLUCOSE-CAPILLARY: 105 mg/dL — AB (ref 70–99)

## 2014-07-28 LAB — AMMONIA: Ammonia: 74 umol/L — ABNORMAL HIGH (ref 11–32)

## 2014-07-28 MED ORDER — VITAMIN B-1 100 MG PO TABS
100.0000 mg | ORAL_TABLET | Freq: Every day | ORAL | Status: DC
  Administered 2014-07-29: 100 mg via ORAL
  Filled 2014-07-28: qty 1

## 2014-07-28 MED ORDER — SODIUM CHLORIDE 0.9 % IV SOLN
INTRAVENOUS | Status: DC
  Administered 2014-07-29 (×2): via INTRAVENOUS

## 2014-07-28 MED ORDER — SODIUM CHLORIDE 0.9 % IJ SOLN
3.0000 mL | Freq: Two times a day (BID) | INTRAMUSCULAR | Status: DC
Start: 2014-07-28 — End: 2014-08-01
  Administered 2014-07-29 – 2014-08-01 (×6): 3 mL via INTRAVENOUS

## 2014-07-28 MED ORDER — LACTULOSE 10 GM/15ML PO SOLN
30.0000 g | Freq: Once | ORAL | Status: AC
  Administered 2014-07-28: 30 g via ORAL
  Filled 2014-07-28: qty 45

## 2014-07-28 MED ORDER — SODIUM POLYSTYRENE SULFONATE 15 GM/60ML PO SUSP
30.0000 g | Freq: Once | ORAL | Status: AC
  Administered 2014-07-29: 30 g via ORAL
  Filled 2014-07-28: qty 120

## 2014-07-28 MED ORDER — TRAZODONE HCL 100 MG PO TABS: 100.0000 mg | ORAL_TABLET | Freq: Every day | ORAL | Status: DC

## 2014-07-28 MED ORDER — ONDANSETRON HCL 4 MG/2ML IJ SOLN: 4.0000 mg | Freq: Four times a day (QID) | INTRAMUSCULAR | Status: DC | PRN

## 2014-07-28 MED ORDER — AMITRIPTYLINE HCL 25 MG PO TABS
25.0000 mg | ORAL_TABLET | Freq: Every day | ORAL | Status: DC
Start: 2014-07-28 — End: 2014-07-29
  Filled 2014-07-28: qty 1

## 2014-07-28 MED ORDER — ENSURE COMPLETE PO LIQD
237.0000 mL | Freq: Two times a day (BID) | ORAL | Status: DC
  Administered 2014-07-29 – 2014-08-01 (×8): 237 mL via ORAL

## 2014-07-28 MED ORDER — FOLIC ACID 1 MG PO TABS
1.0000 mg | ORAL_TABLET | Freq: Every day | ORAL | Status: DC
  Administered 2014-07-29 – 2014-08-01 (×4): 1 mg via ORAL
  Filled 2014-07-28 (×4): qty 1

## 2014-07-28 MED ORDER — ONDANSETRON HCL 4 MG PO TABS: 4.0000 mg | ORAL_TABLET | Freq: Four times a day (QID) | ORAL | Status: DC | PRN

## 2014-07-28 MED ORDER — INSULIN GLARGINE 100 UNIT/ML ~~LOC~~ SOLN
38.0000 [IU] | Freq: Every day | SUBCUTANEOUS | Status: DC
  Administered 2014-07-29 – 2014-07-31 (×4): 38 [IU] via SUBCUTANEOUS
  Filled 2014-07-28 (×5): qty 0.38

## 2014-07-28 MED ORDER — PANTOPRAZOLE SODIUM 40 MG PO TBEC
40.0000 mg | DELAYED_RELEASE_TABLET | Freq: Two times a day (BID) | ORAL | Status: DC
  Administered 2014-07-29 – 2014-08-01 (×8): 40 mg via ORAL
  Filled 2014-07-28 (×7): qty 1

## 2014-07-28 MED ORDER — FUROSEMIDE 10 MG/ML IJ SOLN
40.0000 mg | Freq: Once | INTRAMUSCULAR | Status: AC
  Administered 2014-07-29: 40 mg via INTRAVENOUS
  Filled 2014-07-28: qty 4

## 2014-07-28 MED ORDER — SODIUM CHLORIDE 0.9 % IJ SOLN: 3.0000 mL | INTRAMUSCULAR | Status: DC | PRN

## 2014-07-28 MED ORDER — LACTULOSE 10 GM/15ML PO SOLN
30.0000 g | Freq: Four times a day (QID) | ORAL | Status: DC
  Administered 2014-07-29 (×4): 30 g via ORAL
  Filled 2014-07-28 (×9): qty 45

## 2014-07-28 MED ORDER — ADULT MULTIVITAMIN W/MINERALS CH
1.0000 | ORAL_TABLET | Freq: Every day | ORAL | Status: DC
  Administered 2014-07-29 – 2014-08-01 (×4): 1 via ORAL
  Filled 2014-07-28 (×4): qty 1

## 2014-07-28 MED ORDER — RIFAXIMIN 550 MG PO TABS
550.0000 mg | ORAL_TABLET | Freq: Two times a day (BID) | ORAL | Status: DC
Start: 2014-07-28 — End: 2014-08-01
  Administered 2014-07-29 – 2014-08-01 (×8): 550 mg via ORAL
  Filled 2014-07-28 (×10): qty 1

## 2014-07-28 MED ORDER — SODIUM CHLORIDE 0.9 % IV SOLN: 250.0000 mL | INTRAVENOUS | Status: DC | PRN

## 2014-07-28 NOTE — H&P (Signed)
[facility] History and Physical  [name] MPN:[id number] DOB: 11-18-1955 DOA: 07/28/2014  Referring physician: Delora Fuel, MD PCP: Barbette Merino, MD   Chief Complaint: Confusion  HPI: [name] is a 59 y.o. male presents with confusion and falls. Patient was at home and states that he had been having episodes of falling. Patient states he has not been drinking for the past three years. He states that he has been taking all his medications. He states that his fiancee helps him to take all his medications. He has noted generalized weakness also. He states that this was the cause of him falling around the house. He states he has not lost conscioussness. He denies having headaches. No fevers noted. He has no increased swelling of his abdomen.   Review of Systems:  Constitutional:  No weight loss, night sweats, Fevers, chills, fatigue.  HEENT:  No headaches Cardio-vascular:  No chest pain, Orthopnea, PND, ++swelling in lower extremities, +dizziness GI:  No heartburn, indigestion, abdominal pain, nausea, vomiting Resp:  No shortness of breath with exertion or at rest.No coughing up of blood Skin:  no rash or lesions.  GU:  no dysuria, change in color of urine, no urgency or frequency Musculoskeletal:  No joint pain or swelling. No decreased range of motion Psych: confused  Past Medical History  Diagnosis Date  . Neuropathy   . Diabetes mellitus   . Bipolar affect, depressed   . Hypertension   . Arthritis   . Stroke     Mini stroke about 41yrs ago  . Cirrhosis   . Alcohol abuse   . Chronic pain   . Cocaine abuse   . Muscle spasm     both legs  . Encephalopathy, hepatic   . Detached retina   . COPD (chronic obstructive pulmonary disease)     emphysema  . Bronchitis   . Barrett's esophagus   . GERD (gastroesophageal reflux disease)     has ulcer  . Anemia    Past Surgical History  Procedure Laterality Date  . Fracture surgery      Leg and arm 62yrs ago    . Esophagogastroduodenoscopy  04/04/2012    Procedure: ESOPHAGOGASTRODUODENOSCOPY (EGD);  Surgeon: Irene Shipper, MD;  Location: [facility] ENDOSCOPY;  Service: Endoscopy;  Laterality: N/A;  . Esophagogastroduodenoscopy Left 03/13/2013    Procedure: ESOPHAGOGASTRODUODENOSCOPY (EGD);  Surgeon: Arta Silence, MD;  Location: [facility] ENDOSCOPY;  Service: Endoscopy;  Laterality: Left;  Marland Kitchen Eye surgery  8 months ago both eyes    cataracts both eyes, detached eye, gas pocket  . Vasectomy    . Pars plana vitrectomy Left 07/08/2013    Procedure: PARS PLANA VITRECTOMY WITH 25 GAUGE;  Surgeon: Hurman Horn, MD;  Location: [place];  Service: Ophthalmology;  Laterality: Left;  . Intraocular lens removal Left 07/08/2013    Procedure: REMOVAL OF INTRAOCULAR LENS;  Surgeon: Hurman Horn, MD;  Location: [place];  Service: Ophthalmology;  Laterality: Left;  . Placement and suture of secondary intraocular lens Left 07/08/2013    Procedure: PLACEMENT AND SUTURE OF SECONDARY INTRAOCULAR LENS;  Surgeon: Hurman Horn, MD;  Location: [place];  Service: Ophthalmology;  Laterality: Left;  Insertion of Anterior Capsule Intraocular Lens   . Esophagogastroduodenoscopy N/A 01/16/2014    Procedure: ESOPHAGOGASTRODUODENOSCOPY (EGD);  Surgeon: Winfield Cunas., MD;  Location: [facility] ENDOSCOPY;  Service: Endoscopy;  Laterality: N/A;  . Colonoscopy N/A 01/17/2014    Procedure: COLONOSCOPY;  Surgeon: Winfield Cunas., MD;  Location: [facility] ENDOSCOPY;  Service: Endoscopy;  Laterality: N/A;  possible banding   Social History:  reports that he has been smoking Cigarettes.  He has a 30 pack-year smoking history. He has never used smokeless tobacco. He reports that he drinks alcohol. He reports that he uses illicit drugs (Cocaine).  No Known Allergies  Family History  Problem Relation Age of Onset  . Hypotension Mother      Prior to Admission medications   Medication Sig Start Date End Date Taking? Authorizing Provider  ALPRAZolam Duanne Moron) 0.5 MG  tablet Take 0.5 mg by mouth 2 (two) times daily as needed. 05/07/14   Historical Provider, MD  amitriptyline (ELAVIL) 25 MG tablet Take 25 mg by mouth at bedtime. 07/15/14   Historical Provider, MD  B-D ULTRAFINE III SHORT PEN 31G X 8 MM MISC See admin instructions. 04/25/14   Historical Provider, MD  diazepam (VALIUM) 5 MG tablet Take 5 mg by mouth 3 (three) times daily. 06/26/14   Historical Provider, MD  diclofenac sodium (VOLTAREN) 1 % GEL Apply 1 application topically 2 (two) times daily as needed (pain in shoulders and back.). 02/19/14   Encarnacion Slates, NP  furosemide (LASIX) 20 MG tablet Take 1 tablet (20 mg total) by mouth 2 (two) times daily. For swelling 02/19/14   Encarnacion Slates, NP  furosemide (LASIX) 40 MG tablet Take 40 mg by mouth daily. 06/26/14   Historical Provider, MD  ibuprofen (ADVIL,MOTRIN) 800 MG tablet Take 800 mg by mouth 3 (three) times daily. 05/07/14   Historical Provider, MD  insulin aspart (NOVOLOG FLEXPEN) 100 UNIT/ML FlexPen Inject 6 Units into the skin 3 (three) times daily with meals. For diabetes 02/19/14   Encarnacion Slates, NP  Insulin Glargine (LANTUS SOLOSTAR) 100 UNIT/ML Solostar Pen Inject 38 units into the skin at bedtime: For diabetes 02/19/14   Encarnacion Slates, NP  lactulose (CHRONULAC) 10 GM/15ML solution Take 45 mLs (30 g total) by mouth 3 (three) times daily. Titrate to at least 3 BMs daily: For constipation 02/19/14   Encarnacion Slates, NP  lactulose, encephalopathy, (CHRONULAC) 10 GM/15ML SOLN Take 45 mLs by mouth 4 (four) times daily. 07/16/14   Historical Provider, MD  lisinopril (PRINIVIL,ZESTRIL) 20 MG tablet Take 20 mg by mouth daily. 06/29/14   Historical Provider, MD  Oxycodone HCl 10 MG TABS Take 2 tablets as needed daily: For pain 02/19/14   Encarnacion Slates, NP  pantoprazole (PROTONIX) 40 MG tablet Take 40 mg by mouth 2 (two) times daily. 07/09/14   Historical Provider, MD  rifaximin (XIFAXAN) 550 MG TABS tablet Take 1 tablet (550 mg total) by mouth 2 (two) times daily. For  IBS 02/19/14   Encarnacion Slates, NP  spironolactone (ALDACTONE) 50 MG tablet Tale 2 tablets (100 mg) daily: For high blood pressure Patient taking differently: Take 100 mg by mouth daily.  02/19/14   Encarnacion Slates, NP  traZODone (DESYREL) 100 MG tablet Take 100 mg by mouth daily. 05/28/14   Historical Provider, MD   Physical Exam: Filed Vitals:   07/28/14 2045 07/28/14 2100 07/28/14 2130 07/28/14 2145  BP: 144/89 142/77 143/70 136/68  Pulse: 104 98 89 86  Temp:      TempSrc:      Resp: 11 11 10 10   Height:      Weight:      SpO2: 98% 97% 97% 96%    Wt Readings from Last 3 Encounters:  07/28/14 95.709 kg (211 lb)  05/26/14  95.709 kg (211 lb)  03/30/14 97.523 kg (215 lb)    General:  Appears calm and comfortable Eyes: PERRL, normal lids, irises & conjunctiva ENT: grossly normal hearing, lips & tongue Neck: no LAD, masses or thyromegaly Cardiovascular: RRR, no m/r/g. ++LE edema. Respiratory: CTA bilaterally, no w/r/r. Normal respiratory effort. Abdomen: soft, increased girth no fluid thrill Skin: no rash or induration seen on limited exam Musculoskeletal: grossly normal tone BUE/BLE Psychiatric: confused Neurologic: grossly non-focal.          Labs on Admission:  Basic Metabolic Panel:  Recent Labs Lab 07/28/14 1927  NA 135  K 5.7*  CL 107  CO2 20  GLUCOSE 111*  BUN 17  CREATININE 1.39*  CALCIUM 8.4   Liver Function Tests:  Recent Labs Lab 07/28/14 1927  AST 75*  ALT 26  ALKPHOS 87  BILITOT 3.4*  PROT 5.6*  ALBUMIN 2.7*   No results for input(s): LIPASE, AMYLASE in the last 168 hours.  Recent Labs Lab 07/28/14 2034  AMMONIA 74*   CBC:  Recent Labs Lab 07/28/14 1927 07/28/14 1950  WBC 2.8*  --   NEUTROABS  --  1.8  HGB 10.4*  --   HCT 29.5*  --   MCV 91.6  --   PLT 57*  --    Cardiac Enzymes: No results for input(s): CKTOTAL, CKMB, CKMBINDEX, TROPONINI in the last 168 hours.  BNP (last 3 results) No results for input(s): BNP in the last  8760 hours.  ProBNP (last 3 results)  Recent Labs  02/08/14 1830 02/10/14 1547  PROBNP 966.3* 805.5*    CBG:  Recent Labs Lab 07/28/14 1945  GLUCAP 105*    Radiological Exams on Admission: Ct Head Wo Contrast  07/28/2014   CLINICAL DATA:  Weakness and fatigue beginning 07/27/2014.  EXAM: CT HEAD WITHOUT CONTRAST  TECHNIQUE: Contiguous axial images were obtained from the base of the skull through the vertex without intravenous contrast.  COMPARISON:  Head CT scan 02/10/2014.  Brain MRI 09/13/2013.  FINDINGS: The brain appears normal without hemorrhage, infarct, mass lesion, mass effect, midline shift or abnormal extra-axial fluid collection. No hydrocephalus or pneumocephalus. The calvarium is intact. Trace amount of fluid in the left mastoid air cells is noted and unchanged. No imaged paranasal sinuses and right mastoid air cells are clear.  IMPRESSION: No acute abnormality.   Electronically Signed   By: Inge Rise M.D.   On: 07/28/2014 20:20      Assessment/Plan Active Problems:   Alcohol abuse   HTN (hypertension)   Diabetes mellitus   Cirrhosis   Acute encephalopathy   Encephalopathy, hepatic   Acute kidney injury   Hyperkalemia   Encephalopathy   1. Hepatic Encephalopathy -will continue with lactulose   -continue with rifaximin -hold valium and xanax for now may be contributing to confusion -daily weights and neurochecks  2. Diabetes Mellitus on Insulin -monitor FSBS -insulin coverage as needed  3. HTN -Will monitor pressures -hold lisinopril due to hyperkalemia and elevated creatinine -consider cardiazem in place  4. AKI -monitor labs for now -may be due to third spacing -hold lisinopril  5. Hyperkalemia -will hold spironolactone for now -will give kayexalate now  6. Edema -will give lasix now -will reassess in am  Code Status: Full Code (must indicate code status--if unknown or must be presumed, indicate so) DVT Prophylaxis:SCDs Family  Communication: NOne (indicate person spoken with, if applicable, with phone number if by telephone) Disposition Plan: Home (indicate anticipated LOS)  Time  spent: 55min  Kelce Bouton A [facility] Pager [phone]

## 2014-07-28 NOTE — ED Notes (Signed)
pharmacy called about sending lactulose

## 2014-07-28 NOTE — ED Provider Notes (Signed)
CSN: [id number]     Arrival date & time 07/28/14  1851 History   First MD Initiated Contact with Patient 07/28/14 1941     Chief Complaint  Patient presents with  . Altered Mental Status     (Consider location/radiation/quality/duration/timing/severity/associated sxs/prior Treatment) Patient is a 59 y.o. male presenting with altered mental status. The history is provided by the patient and the EMS personnel. The history is limited by the condition of the patient (Altered mental status.).  Altered Mental Status He was reported to be very weak at home. He states he fell when he got out of bed. He has a history of cirrhosis and will get weak like this when his ammonia level goes up. He states he has been compliant with his medications. He denies fever chills there is no nausea or vomiting.  Past Medical History  Diagnosis Date  . Neuropathy   . Diabetes mellitus   . Bipolar affect, depressed   . Hypertension   . Arthritis   . Stroke     Mini stroke about 40yrs ago  . Cirrhosis   . Alcohol abuse   . Chronic pain   . Cocaine abuse   . Muscle spasm     both legs  . Encephalopathy, hepatic   . Detached retina   . COPD (chronic obstructive pulmonary disease)     emphysema  . Bronchitis   . Barrett's esophagus   . GERD (gastroesophageal reflux disease)     has ulcer  . Anemia    Past Surgical History  Procedure Laterality Date  . Fracture surgery      Leg and arm 85yrs ago  . Esophagogastroduodenoscopy  04/04/2012    Procedure: ESOPHAGOGASTRODUODENOSCOPY (EGD);  Surgeon: Irene Shipper, MD;  Location: [facility] ENDOSCOPY;  Service: Endoscopy;  Laterality: N/A;  . Esophagogastroduodenoscopy Left 03/13/2013    Procedure: ESOPHAGOGASTRODUODENOSCOPY (EGD);  Surgeon: Arta Silence, MD;  Location: [facility] ENDOSCOPY;  Service: Endoscopy;  Laterality: Left;  Marland Kitchen Eye surgery  8 months ago both eyes    cataracts both eyes, detached eye, gas pocket  . Vasectomy    . Pars plana vitrectomy Left 07/08/2013     Procedure: PARS PLANA VITRECTOMY WITH 25 GAUGE;  Surgeon: Hurman Horn, MD;  Location: [place];  Service: Ophthalmology;  Laterality: Left;  . Intraocular lens removal Left 07/08/2013    Procedure: REMOVAL OF INTRAOCULAR LENS;  Surgeon: Hurman Horn, MD;  Location: [place];  Service: Ophthalmology;  Laterality: Left;  . Placement and suture of secondary intraocular lens Left 07/08/2013    Procedure: PLACEMENT AND SUTURE OF SECONDARY INTRAOCULAR LENS;  Surgeon: Hurman Horn, MD;  Location: [place];  Service: Ophthalmology;  Laterality: Left;  Insertion of Anterior Capsule Intraocular Lens   . Esophagogastroduodenoscopy N/A 01/16/2014    Procedure: ESOPHAGOGASTRODUODENOSCOPY (EGD);  Surgeon: Winfield Cunas., MD;  Location: [facility] ENDOSCOPY;  Service: Endoscopy;  Laterality: N/A;  . Colonoscopy N/A 01/17/2014    Procedure: COLONOSCOPY;  Surgeon: Winfield Cunas., MD;  Location: [facility] ENDOSCOPY;  Service: Endoscopy;  Laterality: N/A;  possible banding   Family History  Problem Relation Age of Onset  . Hypotension Mother    History  Substance Use Topics  . Smoking status: Current Every Day Smoker -- 1.00 packs/day for 30 years    Types: Cigarettes    Last Attempt to Quit: 04/06/2012  . Smokeless tobacco: Never Used     Comment: quit   . Alcohol Use: 0.0 oz/week  Comment: 12 pk beer daily  06/2013 - no alcohol since 11/2012    Review of Systems  Unable to perform ROS: Mental status change      Allergies  Review of patient's allergies indicates no known allergies.  Home Medications   Prior to Admission medications   Medication Sig Start Date End Date Taking? Authorizing Provider  ALPRAZolam Duanne Moron) 0.5 MG tablet Take 0.5 mg by mouth 2 (two) times daily as needed. 05/07/14   Historical Provider, MD  amitriptyline (ELAVIL) 25 MG tablet Take 25 mg by mouth at bedtime. 07/15/14   Historical Provider, MD  B-D ULTRAFINE III SHORT PEN 31G X 8 MM MISC See admin instructions. 04/25/14    Historical Provider, MD  diazepam (VALIUM) 5 MG tablet Take 5 mg by mouth 3 (three) times daily. 06/26/14   Historical Provider, MD  diclofenac sodium (VOLTAREN) 1 % GEL Apply 1 application topically 2 (two) times daily as needed (pain in shoulders and back.). 02/19/14   Encarnacion Slates, NP  furosemide (LASIX) 20 MG tablet Take 1 tablet (20 mg total) by mouth 2 (two) times daily. For swelling 02/19/14   Encarnacion Slates, NP  furosemide (LASIX) 40 MG tablet Take 40 mg by mouth daily. 06/26/14   Historical Provider, MD  ibuprofen (ADVIL,MOTRIN) 800 MG tablet Take 800 mg by mouth 3 (three) times daily. 05/07/14   Historical Provider, MD  insulin aspart (NOVOLOG FLEXPEN) 100 UNIT/ML FlexPen Inject 6 Units into the skin 3 (three) times daily with meals. For diabetes 02/19/14   Encarnacion Slates, NP  Insulin Glargine (LANTUS SOLOSTAR) 100 UNIT/ML Solostar Pen Inject 38 units into the skin at bedtime: For diabetes 02/19/14   Encarnacion Slates, NP  lactulose (CHRONULAC) 10 GM/15ML solution Take 45 mLs (30 g total) by mouth 3 (three) times daily. Titrate to at least 3 BMs daily: For constipation 02/19/14   Encarnacion Slates, NP  lactulose, encephalopathy, (CHRONULAC) 10 GM/15ML SOLN Take 45 mLs by mouth 4 (four) times daily. 07/16/14   Historical Provider, MD  lisinopril (PRINIVIL,ZESTRIL) 20 MG tablet Take 20 mg by mouth daily. 06/29/14   Historical Provider, MD  Oxycodone HCl 10 MG TABS Take 2 tablets as needed daily: For pain 02/19/14   Encarnacion Slates, NP  pantoprazole (PROTONIX) 40 MG tablet Take 40 mg by mouth 2 (two) times daily. 07/09/14   Historical Provider, MD  rifaximin (XIFAXAN) 550 MG TABS tablet Take 1 tablet (550 mg total) by mouth 2 (two) times daily. For IBS 02/19/14   Encarnacion Slates, NP  spironolactone (ALDACTONE) 50 MG tablet Tale 2 tablets (100 mg) daily: For high blood pressure Patient taking differently: Take 100 mg by mouth daily.  02/19/14   Encarnacion Slates, NP  traZODone (DESYREL) 100 MG tablet Take 100 mg by mouth  daily. 05/28/14   Historical Provider, MD   BP 117/64 mmHg  Pulse 97  Temp(Src) 98.6 F (37 C) (Oral)  Resp 17  Ht 5\' 10"  (1.778 m)  Wt 211 lb (95.709 kg)  BMI 30.28 kg/m2  SpO2 95% Physical Exam  Nursing note and vitals reviewed.  59 year old male, resting comfortably and in no acute distress. Vital signs are normal. Oxygen saturation is 95%, which is normal. He is lethargic but arousable. Head is normocephalic and atraumatic. PERRLA, EOMI. Oropharynx is clear. Mild scleral icterus is present. Neck is nontender and supple without adenopathy or JVD. Back is nontender and there is no CVA tenderness. Lungs are clear  without rales, wheezes, or rhonchi. Chest is nontender. Heart has regular rate and rhythm without murmur. Abdomen is soft, flat, nontender without masses or hepatosplenomegaly and peristalsis is normoactive. Fluid wave is present. Extremities 2-3+ edema, full range of motion is present. Skin is warm and dry without rash. Neurologic: He is drowsy but arousable and answers questions appropriately. He is oriented. Speech is slightly slurred, cranial nerves are intact, there are no motor or sensory deficits. No asterixis is noted.  ED Course  Procedures (including critical care time) Labs Review Results for orders placed or performed during the hospital encounter of 07/28/14  CBC  Result Value Ref Range   WBC 2.8 (L) 4.0 - 10.5 K/uL   RBC 3.22 (L) 4.22 - 5.81 MIL/uL   Hemoglobin 10.4 (L) 13.0 - 17.0 g/dL   HCT 29.5 (L) 39.0 - 52.0 %   MCV 91.6 78.0 - 100.0 fL   MCH 32.3 26.0 - 34.0 pg   MCHC 35.3 30.0 - 36.0 g/dL   RDW 17.3 (H) 11.5 - 15.5 %   Platelets 57 (L) 150 - 400 K/uL  Comprehensive metabolic panel  Result Value Ref Range   Sodium 135 135 - 145 mmol/L   Potassium 5.7 (H) 3.5 - 5.1 mmol/L   Chloride 107 96 - 112 mmol/L   CO2 20 19 - 32 mmol/L   Glucose, Bld 111 (H) 70 - 99 mg/dL   BUN 17 6 - 23 mg/dL   Creatinine, Ser 1.39 (H) 0.50 - 1.35 mg/dL   Calcium  8.4 8.4 - 10.5 mg/dL   Total Protein 5.6 (L) 6.0 - 8.3 g/dL   Albumin 2.7 (L) 3.5 - 5.2 g/dL   AST 75 (H) 0 - 37 U/L   ALT 26 0 - 53 U/L   Alkaline Phosphatase 87 39 - 117 U/L   Total Bilirubin 3.4 (H) 0.3 - 1.2 mg/dL   GFR calc non Af Amer 54 (L) >90 mL/min   GFR calc Af Amer 63 (L) >90 mL/min   Anion gap 8 5 - 15  Ammonia  Result Value Ref Range   Ammonia 74 (H) 11 - 32 umol/L  Urinalysis, Routine w reflex microscopic  Result Value Ref Range   Color, Urine YELLOW YELLOW   APPearance CLEAR CLEAR   Specific Gravity, Urine 1.012 1.005 - 1.030   pH 5.0 5.0 - 8.0   Glucose, UA NEGATIVE NEGATIVE mg/dL   Hgb urine dipstick NEGATIVE NEGATIVE   Bilirubin Urine NEGATIVE NEGATIVE   Ketones, ur NEGATIVE NEGATIVE mg/dL   Protein, ur NEGATIVE NEGATIVE mg/dL   Urobilinogen, UA 0.2 0.0 - 1.0 mg/dL   Nitrite NEGATIVE NEGATIVE   Leukocytes, UA NEGATIVE NEGATIVE  Differential  Result Value Ref Range   Neutrophils Relative % 66 43 - 77 %   Neutro Abs 1.8 1.7 - 7.7 K/uL   Lymphocytes Relative 20 12 - 46 %   Lymphs Abs 0.5 (L) 0.7 - 4.0 K/uL   Monocytes Relative 9 3 - 12 %   Monocytes Absolute 0.3 0.1 - 1.0 K/uL   Eosinophils Relative 5 0 - 5 %   Eosinophils Absolute 0.1 0.0 - 0.7 K/uL   Basophils Relative 0 0 - 1 %   Basophils Absolute 0.0 0.0 - 0.1 K/uL  Protime-INR  Result Value Ref Range   Prothrombin Time 19.8 (H) 11.6 - 15.2 seconds   INR 1.66 (H) 0.00 - 1.49  CBG monitoring, ED  Result Value Ref Range   Glucose-Capillary 105 (H) 70 -  99 mg/dL   Imaging Review Ct Head Wo Contrast  07/28/2014   CLINICAL DATA:  Weakness and fatigue beginning 07/27/2014.  EXAM: CT HEAD WITHOUT CONTRAST  TECHNIQUE: Contiguous axial images were obtained from the base of the skull through the vertex without intravenous contrast.  COMPARISON:  Head CT scan 02/10/2014.  Brain MRI 09/13/2013.  FINDINGS: The brain appears normal without hemorrhage, infarct, mass lesion, mass effect, midline shift or abnormal  extra-axial fluid collection. No hydrocephalus or pneumocephalus. The calvarium is intact. Trace amount of fluid in the left mastoid air cells is noted and unchanged. No imaged paranasal sinuses and right mastoid air cells are clear.  IMPRESSION: No acute abnormality.   Electronically Signed   By: Inge Rise M.D.   On: 07/28/2014 20:20     EKG Interpretation   Date/Time:  Monday July 28 2014 18:55:28 EST Ventricular Rate:  98 PR Interval:  176 QRS Duration: 93 QT Interval:  363 QTC Calculation: 463 R Axis:   26 Text Interpretation:  Sinus rhythm Normal ECG When compared with ECG of  05/26/2014, HEART RATE has increased Confirmed by [facility]  MD, Elisah Parmer ([id number])  on 07/28/2014 7:04:30 PM      MDM   Final diagnoses:  Fall at home  Hepatic encephalopathy  Pancytopenia  Renal insufficiency    Altered mentation which is likely due to hepatic encephalopathy. Old records are reviewed and [facility]. Screening labs including ammonia level have been ordered.  Ammonia is only mildly elevated, but patient clinically does have hepatic encephalopathy. INR is mildly elevated. Pancytopenia is present not significantly changed from baseline. There has been slight worsening of renal function which will need to be followed. He is given a dose of lactulose and case is discussed with Dr. Humphrey Rolls of [facility] who agrees to admit the patient.  Delora Fuel, MD 50/[id number]

## 2014-07-28 NOTE — ED Notes (Addendum)
Per EMS pt started feeling bad yesterday morning and feeling weak. Pt lives at home with family and pt was unable to get out of bed this AM because he has felt weak and tired. EMS got pt out of bed. On arrival to ED pt is very lethargic but responds to voice, follows commands and is ox4.  Pt states this has happen before with elevated ammonia level.

## 2014-07-29 LAB — CBC
HCT: 28.2 % — ABNORMAL LOW (ref 39.0–52.0)
HEMOGLOBIN: 9.5 g/dL — AB (ref 13.0–17.0)
MCH: 31.8 pg (ref 26.0–34.0)
MCHC: 33.7 g/dL (ref 30.0–36.0)
MCV: 94.3 fL (ref 78.0–100.0)
Platelets: 39 10*3/uL — ABNORMAL LOW (ref 150–400)
RBC: 2.99 MIL/uL — AB (ref 4.22–5.81)
RDW: 17.6 % — ABNORMAL HIGH (ref 11.5–15.5)
WBC: 3 10*3/uL — ABNORMAL LOW (ref 4.0–10.5)

## 2014-07-29 LAB — COMPREHENSIVE METABOLIC PANEL
ALT: 26 U/L (ref 0–53)
ANION GAP: 9 (ref 5–15)
AST: 37 U/L (ref 0–37)
Albumin: 2.4 g/dL — ABNORMAL LOW (ref 3.5–5.2)
Alkaline Phosphatase: 85 U/L (ref 39–117)
BUN: 18 mg/dL (ref 6–23)
CALCIUM: 8.4 mg/dL (ref 8.4–10.5)
CO2: 22 mmol/L (ref 19–32)
Chloride: 107 mmol/L (ref 96–112)
Creatinine, Ser: 1.4 mg/dL — ABNORMAL HIGH (ref 0.50–1.35)
GFR calc non Af Amer: 54 mL/min — ABNORMAL LOW (ref >90)
GFR, EST AFRICAN AMERICAN: 63 mL/min — AB (ref >90)
Glucose, Bld: 140 mg/dL — ABNORMAL HIGH (ref 70–99)
Potassium: 3.6 mmol/L (ref 3.5–5.1)
Sodium: 138 mmol/L (ref 135–145)
TOTAL PROTEIN: 5.3 g/dL — AB (ref 6.0–8.3)
Total Bilirubin: 2.4 mg/dL — ABNORMAL HIGH (ref 0.3–1.2)

## 2014-07-29 LAB — PROTIME-INR
INR: 1.75 — ABNORMAL HIGH (ref 0.00–1.49)
PROTHROMBIN TIME: 20.6 s — AB (ref 11.6–15.2)

## 2014-07-29 LAB — GLUCOSE, CAPILLARY
GLUCOSE-CAPILLARY: 101 mg/dL — AB (ref 70–99)
GLUCOSE-CAPILLARY: 131 mg/dL — AB (ref 70–99)
GLUCOSE-CAPILLARY: 221 mg/dL — AB (ref 70–99)
Glucose-Capillary: 165 mg/dL — ABNORMAL HIGH (ref 70–99)

## 2014-07-29 LAB — APTT: aPTT: 47 seconds — ABNORMAL HIGH (ref 24–37)

## 2014-07-29 MED ORDER — DIAZEPAM 2 MG PO TABS
2.0000 mg | ORAL_TABLET | Freq: Three times a day (TID) | ORAL | Status: DC | PRN
  Administered 2014-07-30 – 2014-08-01 (×2): 2 mg via ORAL
  Filled 2014-07-29 (×4): qty 1

## 2014-07-29 MED ORDER — LORAZEPAM 2 MG/ML IJ SOLN
1.0000 mg | Freq: Four times a day (QID) | INTRAMUSCULAR | Status: AC | PRN
  Administered 2014-07-29 (×2): 1 mg via INTRAVENOUS
  Filled 2014-07-29 (×2): qty 1

## 2014-07-29 MED ORDER — THIAMINE HCL 100 MG/ML IJ SOLN
100.0000 mg | Freq: Every day | INTRAMUSCULAR | Status: DC
  Administered 2014-07-29: 100 mg via INTRAVENOUS
  Filled 2014-07-29 (×4): qty 1

## 2014-07-29 MED ORDER — ADULT MULTIVITAMIN W/MINERALS CH: 1.0000 | ORAL_TABLET | Freq: Every day | ORAL | Status: DC

## 2014-07-29 MED ORDER — INSULIN ASPART 100 UNIT/ML ~~LOC~~ SOLN
0.0000 [IU] | Freq: Three times a day (TID) | SUBCUTANEOUS | Status: DC
  Administered 2014-07-29: 3 [IU] via SUBCUTANEOUS
  Administered 2014-07-30 – 2014-08-01 (×5): 2 [IU] via SUBCUTANEOUS

## 2014-07-29 MED ORDER — ALBUTEROL SULFATE (2.5 MG/3ML) 0.083% IN NEBU
2.5000 mg | INHALATION_SOLUTION | RESPIRATORY_TRACT | Status: DC | PRN
Start: 2014-07-29 — End: 2014-08-01
  Administered 2014-07-29 (×2): 2.5 mg via RESPIRATORY_TRACT
  Filled 2014-07-29 (×2): qty 3

## 2014-07-29 MED ORDER — GUAIFENESIN-DM 100-10 MG/5ML PO SYRP
5.0000 mL | ORAL_SOLUTION | ORAL | Status: DC | PRN
  Administered 2014-07-30: 5 mL via ORAL
  Filled 2014-07-29 (×4): qty 5

## 2014-07-29 MED ORDER — INSULIN ASPART 100 UNIT/ML ~~LOC~~ SOLN: 0.0000 [IU] | Freq: Every day | SUBCUTANEOUS | Status: DC

## 2014-07-29 MED ORDER — VITAMIN B-1 100 MG PO TABS
100.0000 mg | ORAL_TABLET | Freq: Every day | ORAL | Status: DC
  Administered 2014-07-30 – 2014-08-01 (×3): 100 mg via ORAL
  Filled 2014-07-29 (×4): qty 1

## 2014-07-29 MED ORDER — FOLIC ACID 1 MG PO TABS: 1.0000 mg | ORAL_TABLET | Freq: Every day | ORAL | Status: DC

## 2014-07-29 MED ORDER — LORAZEPAM 1 MG PO TABS
1.0000 mg | ORAL_TABLET | Freq: Four times a day (QID) | ORAL | Status: AC | PRN
  Administered 2014-07-31 (×2): 1 mg via ORAL
  Filled 2014-07-29 (×2): qty 1

## 2014-07-29 NOTE — Progress Notes (Signed)
Received prescreen request for inpatient rehab from PT and noted pt's progress with therapy (currently min assist to min guard with mobility). 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   At this time, we recommend that skilled nursing or home with home health support be pursued.   Thank you.  Nanetta Batty, PT Rehabilitation Admissions Coordinator [phone]

## 2014-07-29 NOTE — Progress Notes (Signed)
PROGRESS NOTE  [name] VFI:[id number] DOB: 02-29-1956 DOA: 07/28/2014 PCP: Barbette Merino, MD  Assessment/Plan: 1. 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 Diabetes Mellitus on Insulin -SSI  3. HTN -Will monitor pressures -hold lisinopril due to hyperkalemia and elevated creatinine   4. AKI (baseline 1 but may be higher now that taking lasix/aldactone) -monitor labs for now -hold lisinopril  5. Hyperkalemia -resolved  6. Edema -chronic per patient  Code Status: full Family Communication: patient Disposition Plan: PT eval   Consultants:    Procedures:      HPI/Subjective: No SOB, no CP +BMs  Objective: Filed Vitals:   07/29/14 1000  BP: 122/56  Pulse: 76  Temp: 98.4 F (36.9 C)  Resp: 16    Intake/Output Summary (Last 24 hours) at 07/29/14 1129 Last data filed at 07/29/14 0900  Gross per 24 hour  Intake    670 ml  Output    802 ml  Net   -132 ml   Filed Weights   07/28/14 1901 07/28/14 2255  Weight: 95.709 kg (211 lb) 97.251 kg (214 lb 6.4 oz)    Exam:   General:  Pleasant/cooperative  Cardiovascular: rrr  Respiratory: clear  Abdomen: +BS, soft  Musculoskeletal: +edema, pitting   Data Reviewed: Basic Metabolic Panel:  Recent Labs Lab 07/28/14 1927 07/29/14 0713  NA 135 138  K 5.7* 3.6  CL 107 107  CO2 20 22  GLUCOSE 111* 140*  BUN 17 18  CREATININE 1.39* 1.40*  CALCIUM 8.4 8.4   Liver Function Tests:  Recent Labs Lab 07/28/14 1927 07/29/14 0713  AST 75* 37  ALT 26 26  ALKPHOS 87 85  BILITOT 3.4* 2.4*  PROT 5.6* 5.3*  ALBUMIN 2.7* 2.4*   No results for input(s): LIPASE, AMYLASE in the last 168 hours.  Recent Labs Lab 07/28/14 2034  AMMONIA 74*   CBC:  Recent Labs Lab 07/28/14 1927 07/28/14 1950 07/29/14 0713  WBC 2.8*  --  3.0*  NEUTROABS  --  1.8  --   HGB 10.4*  --  9.5*  HCT 29.5*  --  28.2*    MCV 91.6  --  94.3  PLT 57*  --  39*   Cardiac Enzymes: No results for input(s): CKTOTAL, CKMB, CKMBINDEX, TROPONINI in the last 168 hours. BNP (last 3 results) No results for input(s): BNP in the last 8760 hours.  ProBNP (last 3 results)  Recent Labs  02/08/14 1830 02/10/14 1547  PROBNP 966.3* 805.5*    CBG:  Recent Labs Lab 07/28/14 1945 07/28/14 2321  GLUCAP 105* 101*    No results found for this or any previous visit (from the past 240 hour(s)).   Studies: Ct Head Wo Contrast  07/28/2014   CLINICAL DATA:  Weakness and fatigue beginning 07/27/2014.  EXAM: CT HEAD WITHOUT CONTRAST  TECHNIQUE: Contiguous axial images were obtained from the base of the skull through the vertex without intravenous contrast.  COMPARISON:  Head CT scan 02/10/2014.  Brain MRI 09/13/2013.  FINDINGS: The brain appears normal without hemorrhage, infarct, mass lesion, mass effect, midline shift or abnormal extra-axial fluid collection. No hydrocephalus or pneumocephalus. The calvarium is intact. Trace amount of fluid in the left mastoid air cells is noted and unchanged. No imaged paranasal sinuses and right mastoid air cells are clear.  IMPRESSION: No acute abnormality.   Electronically Signed   By: Inge Rise M.D.   On: 07/28/2014  20:20    Scheduled Meds: . feeding supplement (ENSURE COMPLETE)  237 mL Oral BID BM  . folic acid  1 mg Oral Daily  . insulin glargine  38 Units Subcutaneous Q2200  . lactulose  30 g Oral QID  . multivitamin with minerals  1 tablet Oral Daily  . pantoprazole  40 mg Oral BID  . rifaximin  550 mg Oral BID  . sodium chloride  3 mL Intravenous Q12H  . thiamine  100 mg Oral Daily   Continuous Infusions: . sodium chloride 50 mL/hr at 07/29/14 0118   Antibiotics Given (last 72 hours)    Date/Time Action Medication Dose   07/29/14 0117 Given   rifaximin (XIFAXAN) tablet 550 mg 550 mg   07/29/14 1029 Given   rifaximin (XIFAXAN) tablet 550 mg 550 mg      Active  Problems:   Alcohol abuse   HTN (hypertension)   Diabetes mellitus   Cirrhosis   Acute encephalopathy   Encephalopathy, hepatic   Acute kidney injury   Hyperkalemia   Encephalopathy    Time spent: 35 min    [name]  [facility] Pager [phone] If 7PM-7AM, please contact night-coverage at [email or web address], password [facility] 07/29/2014, 11:29 AM  LOS: 1 day

## 2014-07-29 NOTE — Progress Notes (Addendum)
Nutrition Brief Note  Patient identified on the Malnutrition Screening Tool (MST) Report  Wt Readings from Last 15 Encounters:  07/28/14 214 lb 6.4 oz (97.251 kg)  05/26/14 211 lb (95.709 kg)  03/30/14 215 lb (97.523 kg)  02/14/14 230 lb (104.327 kg)  02/12/14 230 lb (104.327 kg)  02/08/14 230 lb (104.327 kg)  02/05/14 206 lb (93.441 kg)  02/02/14 219 lb (99.338 kg)  02/01/14 219 lb (99.338 kg)  01/29/14 234 lb 9.1 oz (106.4 kg)  01/14/14 245 lb 9.5 oz (111.4 kg)  01/01/14 215 lb (97.523 kg)  09/15/13 186 lb 1.1 oz (84.4 kg)  08/28/13 207 lb 0.2 oz (93.9 kg)  08/19/13 215 lb (97.523 kg)    Body mass index is 30.76 kg/(m^2). Patient meets criteria for class I obesity based on current BMI.   Current diet order is 2 gram sodium, patient is consuming approximately 100% of meals at this time. Pt reports having a good appetite with no other difficulties. Pt currently has Ensure ordered and reports he would like to continue with them. Labs and medications reviewed.   No further nutrition interventions warranted at this time. If nutrition issues arise, please consult RD.   Kallie Locks, MS, RD, LDN Pager # [phone] After hours/ weekend pager # [phone]

## 2014-07-29 NOTE — Evaluation (Signed)
Physical Therapy Evaluation Patient Details Name: [name] MRN: [id number] DOB: Jun 19, 1955 Today's Date: 07/29/2014   History of Present Illness  Pt is a 59 y.o. male presents with confusion and falls. Patient was at home and states that he had been having episodes of falling. Patient states he has not been drinking for the past three years. He states that he has been taking all his medications. He has noted generalized weakness also. He states that this was the cause of him falling around the house. He states he has not lost consciousness.   Clinical Impression  Pt admitted with above diagnosis. Pt currently with functional limitations due to the deficits listed below (see PT Problem List). At the time of PT eval pt was able to perform transfers and ambulation with assist for balance and safety. Pt demonstrated the inability to don/doff his own socks during session and feel an OT eval would be very beneficial for ADL's.   Pt will benefit from skilled PT to increase their independence and safety with mobility to allow discharge to the venue listed below.  As pt was independent PTA feel he would be a good candidate for CIR to be able to return home without assistance.     Follow Up Recommendations CIR;Supervision/Assistance - 24 hour    Equipment Recommendations  None recommended by PT    Recommendations for Other Services Rehab consult; OT eval    Precautions / Restrictions Precautions Precautions: Fall Restrictions Weight Bearing Restrictions: No      Mobility  Bed Mobility Overal bed mobility: Needs Assistance Bed Mobility: Supine to Sit     Supine to sit: Min guard     General bed mobility comments: Close guard for trunk control as pt transitions to EOB. Bed rails and increased time required.   Transfers Overall transfer level: Needs assistance Equipment used: Rolling walker (2 wheeled) Transfers: Sit to/from Stand Sit to Stand: Min assist         General  transfer comment: VC's for hand placement on seated surface for safety. Pt required assist to power-up to full stand and gain standing balance.   Ambulation/Gait Ambulation/Gait assistance: Min guard Ambulation Distance (Feet): 75 Feet Assistive device: Rolling walker (2 wheeled) Gait Pattern/deviations: Step-through pattern;Decreased stride length;Drifts right/left;Trunk flexed Gait velocity: Decreased Gait velocity interpretation: Below normal speed for age/gender General Gait Details: Pt with poor awareness of obstacles. Low vision may play a part in this as pt states he needs cataract surgery. Close guard for safety.   Stairs            Wheelchair Mobility    Modified Rankin (Stroke Patients Only)       Balance Overall balance assessment: Needs assistance Sitting-balance support: Feet supported;No upper extremity supported Sitting balance-Leahy Scale: Fair     Standing balance support: Bilateral upper extremity supported;During functional activity Standing balance-Leahy Scale: Poor                               Pertinent Vitals/Pain Pain Assessment: Faces Faces Pain Scale: Hurts little more Pain Location: R calf, Shoulders, Back Pain Descriptors / Indicators: Aching Pain Intervention(s): Monitored during session;Limited activity within patient's tolerance;Repositioned    Home Living Family/patient expects to be discharged to:: Private residence Living Arrangements: Alone   Type of Home: Apartment Home Access: Level entry     Home Layout: One level Home Equipment: Environmental consultant - 2 wheels;Bedside commode Additional Comments: Pt reports that  his girlfriend has left him and taken all his furniture from the apartment. No family available to assist at d/c as pt's brother is now in jail.    Prior Function Level of Independence: Needs assistance   Gait / Transfers Assistance Needed: with RW  ADL's / Homemaking Assistance Needed: States he was independent  with all ADL's PTA        Hand Dominance   Dominant Hand: Right    Extremity/Trunk Assessment   Upper Extremity Assessment: Defer to OT evaluation;Generalized weakness (L shoulder pain rfom fall; weak grip strength)           Lower Extremity Assessment: Generalized weakness (Grossly 4-/5 bilaterally.)      Cervical / Trunk Assessment: Normal  Communication   Communication: No difficulties  Cognition Arousal/Alertness: Awake/alert Behavior During Therapy: WFL for tasks assessed/performed Overall Cognitive Status: No family/caregiver present to determine baseline cognitive functioning                      General Comments      Exercises        Assessment/Plan    PT Assessment Patient needs continued PT services  PT Diagnosis Difficulty walking;Generalized weakness;Acute pain   PT Problem List Decreased strength;Decreased range of motion;Decreased activity tolerance;Decreased balance;Decreased mobility;Decreased knowledge of use of DME;Decreased safety awareness;Decreased knowledge of precautions  PT Treatment Interventions DME instruction;Gait training;Stair training;Functional mobility training;Therapeutic activities;Therapeutic exercise;Neuromuscular re-education;Patient/family education   PT Goals (Current goals can be found in the Care Plan section) Acute Rehab PT Goals Patient Stated Goal: "Do whatever it takes to get better." PT Goal Formulation: With patient Time For Goal Achievement: 08/12/14 Potential to Achieve Goals: Good    Frequency Min 3X/week   Barriers to discharge Decreased caregiver support Pt essentially alone as he reports his girlfriend has left him and his brother is now in jail.     Co-evaluation               End of Session Equipment Utilized During Treatment: Gait belt Activity Tolerance: Patient limited by fatigue;Patient limited by pain Patient left: in chair;with chair alarm set;with call bell/phone within  reach Nurse Communication: Mobility status         Time: 9381-0175 PT Time Calculation (min) (ACUTE ONLY): 28 min   Charges:   PT Evaluation $Initial PT Evaluation Tier I: 1 Procedure PT Treatments $Gait Training: 8-22 mins   PT G Codes:        Rolinda Roan 08-06-2014, 3:06 PM   Rolinda Roan, PT, DPT Acute Rehabilitation Services Pager: [phone]

## 2014-07-30 ENCOUNTER — Inpatient Hospital Stay (HOSPITAL_COMMUNITY): Payer: Medicare PPO

## 2014-07-30 LAB — GLUCOSE, CAPILLARY
GLUCOSE-CAPILLARY: 102 mg/dL — AB (ref 70–99)
GLUCOSE-CAPILLARY: 154 mg/dL — AB (ref 70–99)
Glucose-Capillary: 152 mg/dL — ABNORMAL HIGH (ref 70–99)
Glucose-Capillary: 163 mg/dL — ABNORMAL HIGH (ref 70–99)

## 2014-07-30 LAB — CBC
HCT: 27.4 % — ABNORMAL LOW (ref 39.0–52.0)
HEMOGLOBIN: 9.3 g/dL — AB (ref 13.0–17.0)
MCH: 32.3 pg (ref 26.0–34.0)
MCHC: 33.9 g/dL (ref 30.0–36.0)
MCV: 95.1 fL (ref 78.0–100.0)
Platelets: 34 10*3/uL — ABNORMAL LOW (ref 150–400)
RBC: 2.88 MIL/uL — ABNORMAL LOW (ref 4.22–5.81)
RDW: 17.3 % — ABNORMAL HIGH (ref 11.5–15.5)
WBC: 2.5 10*3/uL — ABNORMAL LOW (ref 4.0–10.5)

## 2014-07-30 LAB — BASIC METABOLIC PANEL
ANION GAP: 3 — AB (ref 5–15)
BUN: 16 mg/dL (ref 6–23)
CHLORIDE: 108 mmol/L (ref 96–112)
CO2: 27 mmol/L (ref 19–32)
CREATININE: 0.93 mg/dL (ref 0.50–1.35)
Calcium: 8.5 mg/dL (ref 8.4–10.5)
GFR calc non Af Amer: >90 mL/min (ref >90)
GLUCOSE: 128 mg/dL — AB (ref 70–99)
POTASSIUM: 4.1 mmol/L (ref 3.5–5.1)
Sodium: 138 mmol/L (ref 135–145)

## 2014-07-30 LAB — AMMONIA: Ammonia: 95 umol/L — ABNORMAL HIGH (ref 11–32)

## 2014-07-30 MED ORDER — LACTULOSE 10 GM/15ML PO SOLN
30.0000 g | Freq: Two times a day (BID) | ORAL | Status: DC
  Filled 2014-07-30: qty 45

## 2014-07-30 MED ORDER — SPIRONOLACTONE 100 MG PO TABS
100.0000 mg | ORAL_TABLET | Freq: Every day | ORAL | Status: DC
  Administered 2014-07-30 – 2014-08-01 (×3): 100 mg via ORAL
  Filled 2014-07-30 (×3): qty 1

## 2014-07-30 MED ORDER — LACTULOSE 10 GM/15ML PO SOLN
30.0000 g | Freq: Three times a day (TID) | ORAL | Status: DC
  Administered 2014-07-30 – 2014-08-01 (×8): 30 g via ORAL
  Filled 2014-07-30 (×9): qty 45

## 2014-07-30 MED ORDER — FUROSEMIDE 20 MG PO TABS
20.0000 mg | ORAL_TABLET | Freq: Two times a day (BID) | ORAL | Status: DC
  Administered 2014-07-30 – 2014-08-01 (×5): 20 mg via ORAL
  Filled 2014-07-30 (×6): qty 1

## 2014-07-30 NOTE — Progress Notes (Signed)
Utilization review completed. Jsean Taussig, RN, BSN. 

## 2014-07-30 NOTE — Progress Notes (Signed)
Physical Therapy Treatment Patient Details Name: [name] MRN: [id number] DOB: 03-Mar-1956 Today's Date: 07/30/2014    History of Present Illness Pt is a 59 y.o. male presents with confusion and falls. Patient was at home and states that he had been having episodes of falling. Patient states he has not been drinking for the past three years. He states that he has been taking all his medications. He has noted generalized weakness also. He states that this was the cause of him falling around the house. He states he has not lost consciousness.     PT Comments    Pt progressing towards physical therapy goals. Was able to perform transfers and ambulation with min assist for balance and safety. Pt very unsteady at this time. He distracts easily and requires cues to reorient to task. Continue to recommend CIR at this time to return to an independent level at home, however if CIR declines, pt will require SNF as he is unsafe to be alone at home at this time.   Follow Up Recommendations  CIR;Supervision/Assistance - 24 hour     Equipment Recommendations  None recommended by PT    Recommendations for Other Services Rehab consult     Precautions / Restrictions Precautions Precautions: Fall Restrictions Weight Bearing Restrictions: No    Mobility  Bed Mobility Overal bed mobility: Needs Assistance Bed Mobility: Supine to Sit     Supine to sit: Min guard     General bed mobility comments: Close guard for trunk control as pt transitions to EOB. Bed rails and increased time required. Pt was able to reach down to attempt donning socks. Required assist to pull them all the way on.   Transfers Overall transfer level: Needs assistance Equipment used: Rolling walker (2 wheeled) Transfers: Sit to/from Stand Sit to Stand: Min assist         General transfer comment: VC's for hand placement on seated surface for safety. Pt required assist to power-up to full stand and gain standing  balance.   Ambulation/Gait Ambulation/Gait assistance: Min assist Ambulation Distance (Feet): 400 Feet Assistive device: 1 person hand held assist Gait Pattern/deviations: Step-through pattern;Decreased stride length;Staggering left;Staggering right Gait velocity: Decreased   General Gait Details: Pt with poor awareness of obstacles. Low vision may play a part in this as pt states he needs cataract surgery. Min assist for unsteadiness and VC's for direction. One 2 minute standing rest break required for fatigue.    Stairs            Wheelchair Mobility    Modified Rankin (Stroke Patients Only)       Balance Overall balance assessment: Needs assistance Sitting-balance support: No upper extremity supported Sitting balance-Leahy Scale: Fair     Standing balance support: Single extremity supported Standing balance-Leahy Scale: Poor Standing balance comment: Requires assist to maintain dynamic balance.                     Cognition Arousal/Alertness: Awake/alert Behavior During Therapy: WFL for tasks assessed/performed Overall Cognitive Status: No family/caregiver present to determine baseline cognitive functioning                      Exercises      General Comments        Pertinent Vitals/Pain Pain Assessment: No/denies pain    Home Living                      Prior Function  PT Goals (current goals can now be found in the care plan section) Acute Rehab PT Goals Patient Stated Goal: "Do whatever it takes to get better." PT Goal Formulation: With patient Time For Goal Achievement: 08/12/14 Potential to Achieve Goals: Good Progress towards PT goals: Progressing toward goals    Frequency  Min 3X/week    PT Plan Current plan remains appropriate    Co-evaluation             End of Session Equipment Utilized During Treatment: Gait belt Activity Tolerance: Patient limited by fatigue;Patient limited by  pain Patient left: in chair;with chair alarm set;with call bell/phone within reach     Time: 0939-1002 PT Time Calculation (min) (ACUTE ONLY): 23 min  Charges:  $Gait Training: 8-22 mins $Therapeutic Activity: 8-22 mins                    G Codes:      Rolinda Roan 08/18/2014, 10:16 AM   Rolinda Roan, PT, DPT Acute Rehabilitation Services Pager: [phone]

## 2014-07-30 NOTE — Progress Notes (Signed)
PROGRESS NOTE  [name] FGH:[id number] DOB: 06/08/1955 DOA: 07/28/2014 PCP: Darrell Merino, MD  Assessment/Plan: 1. 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 Diabetes Mellitus on Insulin -SSI  3. HTN -Will monitor pressures -hold lisinopril due to hyperkalemia and elevated creatinine   4. AKI (baseline 1 but may be higher now that taking lasix/aldactone) -monitor labs for now -hold lisinopril  5. Hyperkalemia -resolved  6. Edema -chronic per patient -lasix/aldactone  7.  Left shoulder pain after fall- x ray   Code Status: full Family Communication: patient Disposition Plan: PT eval   Consultants:    Procedures:      HPI/Subjective: Lots of stool last PM   Objective: Filed Vitals:   07/30/14 0500  BP: 117/56  Pulse: 80  Temp: 97.9 F (36.6 C)  Resp: 18    Intake/Output Summary (Last 24 hours) at 07/30/14 1017 Last data filed at 07/30/14 0600  Gross per 24 hour  Intake   2165 ml  Output    550 ml  Net   1615 ml   Filed Weights   07/28/14 1901 07/28/14 2255 07/29/14 2100  Weight: 95.709 kg (211 lb) 97.251 kg (214 lb 6.4 oz) 99.973 kg (220 lb 6.4 oz)    Exam:   General:  Pleasant/cooperative  Cardiovascular: rrr  Respiratory: clear  Abdomen: +BS, soft  Musculoskeletal: +edema, pitting   Data Reviewed: Basic Metabolic Panel:  Recent Labs Lab 07/28/14 1927 07/29/14 0713 07/30/14 0619  NA 135 138 138  K 5.7* 3.6 4.1  CL 107 107 108  CO2 20 22 27   GLUCOSE 111* 140* 128*  BUN 17 18 16   CREATININE 1.39* 1.40* 0.93  CALCIUM 8.4 8.4 8.5   Liver Function Tests:  Recent Labs Lab 07/28/14 1927 07/29/14 0713  AST 75* 37  ALT 26 26  ALKPHOS 87 85  BILITOT 3.4* 2.4*  PROT 5.6* 5.3*  ALBUMIN 2.7* 2.4*   No results for input(s): LIPASE, AMYLASE in the last 168 hours.  Recent  Labs Lab 07/28/14 2034 07/30/14 0619  AMMONIA 74* 95*   CBC:  Recent Labs Lab 07/28/14 1927 07/28/14 1950 07/29/14 0713 07/30/14 0619  WBC 2.8*  --  3.0* 2.5*  NEUTROABS  --  1.8  --   --   HGB 10.4*  --  9.5* 9.3*  HCT 29.5*  --  28.2* 27.4*  MCV 91.6  --  94.3 95.1  PLT 57*  --  39* 34*   Cardiac Enzymes: No results for input(s): CKTOTAL, CKMB, CKMBINDEX, TROPONINI in the last 168 hours. BNP (last 3 results) No results for input(s): BNP in the last 8760 hours.  ProBNP (last 3 results)  Recent Labs  02/08/14 1830 02/10/14 1547  PROBNP 966.3* 805.5*    CBG:  Recent Labs Lab 07/28/14 2321 07/29/14 0746 07/29/14 1825 07/29/14 2119 07/30/14 0729  GLUCAP 101* 131* 221* 165* 102*    No results found for this or any previous visit (from the past 240 hour(s)).   Studies: Ct Head Wo Contrast  07/28/2014   CLINICAL DATA:  Weakness and fatigue beginning 07/27/2014.  EXAM: CT HEAD WITHOUT CONTRAST  TECHNIQUE: Contiguous axial images were obtained from the base of the skull through the vertex without intravenous contrast.  COMPARISON:  Head CT scan 02/10/2014.  Brain MRI 09/13/2013.  FINDINGS: The brain appears normal without hemorrhage, infarct, mass lesion, mass effect, midline shift or abnormal  extra-axial fluid collection. No hydrocephalus or pneumocephalus. The calvarium is intact. Trace amount of fluid in the left mastoid air cells is noted and unchanged. No imaged paranasal sinuses and right mastoid air cells are clear.  IMPRESSION: No acute abnormality.   Electronically Signed   By: Inge Rise M.D.   On: 07/28/2014 20:20    Scheduled Meds: . feeding supplement (ENSURE COMPLETE)  237 mL Oral BID BM  . folic acid  1 mg Oral Daily  . insulin aspart  0-5 Units Subcutaneous QHS  . insulin aspart  0-9 Units Subcutaneous TID WC  . insulin glargine  38 Units Subcutaneous Q2200  . lactulose  30 g Oral TID  . multivitamin with minerals  1 tablet Oral Daily  .  pantoprazole  40 mg Oral BID  . rifaximin  550 mg Oral BID  . sodium chloride  3 mL Intravenous Q12H  . thiamine  100 mg Oral Daily   Or  . thiamine  100 mg Intravenous Daily   Continuous Infusions:   Antibiotics Given (last 72 hours)    Date/Time Action Medication Dose   07/29/14 0117 Given   rifaximin (XIFAXAN) tablet 550 mg 550 mg   07/29/14 1029 Given   rifaximin (XIFAXAN) tablet 550 mg 550 mg   07/29/14 2141 Given   rifaximin (XIFAXAN) tablet 550 mg 550 mg      Active Problems:   Alcohol abuse   HTN (hypertension)   Diabetes mellitus   Cirrhosis   Acute encephalopathy   Encephalopathy, hepatic   Acute kidney injury   Hyperkalemia   Encephalopathy    Time spent: 25 min    [name]  [facility] Pager [phone] If 7PM-7AM, please contact night-coverage at [email or web address], password [facility] 07/30/2014, 10:17 AM  LOS: 2 days

## 2014-07-30 NOTE — Clinical Social Work Note (Signed)
CSW received consult for SNF placement. Assessment completed and patient agreeable to SNF placement for short-term rehab. Full assessment to follow.  Ernie Sagrero Givens, MSW, LCSW Licensed Clinical Social Worker [place] [phone]

## 2014-07-30 NOTE — Evaluation (Signed)
Occupational Therapy Evaluation Patient Details Name: [name] MRN: [id number] DOB: Oct 22, 1955 Today's Date: 07/30/2014    History of Present Illness Pt is a 59 y.o. male presents with confusion and falls. Patient was at home and states that he had been having episodes of falling. Patient states he has not been drinking for the past three years. He states that he has been taking all his medications. He has noted generalized weakness also. He states that this was the cause of him falling around the house. He states he has not lost consciousness.    Clinical Impression   Pt admitted with above. Pt reports being independent with ADLs, PTA. Feel pt will benefit from acute OT to increase independence, strength, and activity tolerance prior to d/c. Recommending CIR for rehab. If CIR denies, recommend SNF for rehab prior to d/c home.    Follow Up Recommendations  CIR;Supervision/Assistance - 24 hour    Equipment Recommendations  Other (comment) (defer to next venue)    Recommendations for Other Services Rehab consult     Precautions / Restrictions Precautions Precautions: Fall Restrictions Weight Bearing Restrictions: No      Mobility Bed Mobility Overal bed mobility: Needs Assistance Bed Mobility: Supine to Sit;Sit to Supine     Supine to sit: Min guard Sit to supine: Supervision      Transfers Overall transfer level: Needs assistance Equipment used: Rolling walker (2 wheeled) Transfers: Sit to/from Stand Sit to Stand: Min guard         General transfer comment: cues for hand placement.    Balance  Min guard for ambulation with RW.                                          ADL Overall ADL's : Needs assistance/impaired Eating/Feeding: Set up;Sitting   Grooming: Wash/dry face;Oral care;Min guard;Standing   Upper Body Bathing: Minimal assitance;Sitting   Lower Body Bathing: Minimal assistance;Sit to/from stand   Upper Body Dressing :  Minimal assistance;Sitting   Lower Body Dressing: Minimal assistance;Sit to/from stand   Toilet Transfer: Min guard;Ambulation;RW (bed)   Toileting- Clothing Manipulation and Hygiene: Min guard;Sit to/from stand       Functional mobility during ADLs: Min guard;Rolling walker General ADL Comments: Educated on energy conservation-pt out of breath with LB dressing. Pt ambulated to sink for grooming tasks.      Vision  Pt wears glasses. History of eye surgery in bilateral eyes.   Perception     Praxis      Pertinent Vitals/Pain Pain Assessment: 0-10 Pain Score: 9  Pain Location: both UE's Pain Intervention(s): Monitored during session     Hand Dominance Right   Extremity/Trunk Assessment Upper Extremity Assessment Upper Extremity Assessment: Generalized weakness (pain with shoulder flexion in both arms)   Lower Extremity Assessment Lower Extremity Assessment: Defer to PT evaluation       Communication Communication Communication: No difficulties   Cognition Arousal/Alertness: Lethargic Behavior During Therapy: WFL for tasks assessed/performed Overall Cognitive Status: No family/caregiver present to determine baseline cognitive functioning   Not oriented to place or time; decreased short-term memory; slow processing and follows one step commands inconsistently.                     General Comments       Exercises       Shoulder Instructions  Home Living Family/patient expects to be discharged to:: Private residence Living Arrangements: Alone   Type of Home: Apartment Home Access: Level entry     [place]: One level     Bathroom Shower/Tub: Tub/shower unit         Home Equipment: Environmental consultant - 2 wheels;Bedside commode   Additional Comments: Pt reports that his girlfriend has left him and taken all his furniture from the apartment. No family available to assist at d/c as pt's brother is now in jail.      Prior Functioning/Environment  Level of Independence: Needs assistance  Gait / Transfers Assistance Needed: with RW ADL's / Homemaking Assistance Needed: States he was independent with all ADL's PTA        OT Diagnosis: Acute pain;Generalized weakness;Altered mental status   OT Problem List: Decreased strength;Decreased range of motion;Decreased activity tolerance;Decreased knowledge of use of DME or AE;Decreased knowledge of precautions;Decreased safety awareness;Decreased cognition;Pain;Impaired UE functional use;Increased edema   OT Treatment/Interventions: Self-care/ADL training;Therapeutic exercise;Energy conservation;DME and/or AE instruction;Therapeutic activities;Cognitive remediation/compensation;Patient/family education;Balance training    OT Goals(Current goals can be found in the care plan section) Acute Rehab OT Goals Patient Stated Goal: get back to hunting and fishing OT Goal Formulation: With patient Time For Goal Achievement: 08/06/14 Potential to Achieve Goals: Good ADL Goals Pt Will Perform Lower Body Bathing: with set-up;sit to/from stand Pt Will Perform Upper Body Dressing: with set-up;sitting Pt Will Perform Lower Body Dressing: with set-up;sit to/from stand Pt Will Transfer to Toilet: ambulating;with modified independence Pt Will Perform Toileting - Clothing Manipulation and hygiene: sit to/from stand;with modified independence  OT Frequency: Min 2X/week   Barriers to D/C:            Co-evaluation              End of Session Equipment Utilized During Treatment: Gait belt;Rolling walker  Activity Tolerance: Patient tolerated treatment well Patient left: in bed;with call bell/phone within reach;with bed alarm set   Time: 2023-3435 OT Time Calculation (min): 16 min Charges:  OT General Charges $OT Visit: 1 Procedure OT Evaluation $Initial OT Evaluation Tier I: 1 Procedure G-CodesBenito Mccreedy OTR/L [id number] 07/30/2014, 2:54 PM

## 2014-07-30 NOTE — Progress Notes (Signed)
Fading bruised discoloration noted on the patient's right buttock .

## 2014-07-31 LAB — BASIC METABOLIC PANEL
ANION GAP: 4 — AB (ref 5–15)
BUN: 13 mg/dL (ref 6–23)
CALCIUM: 8.4 mg/dL (ref 8.4–10.5)
CHLORIDE: 106 mmol/L (ref 96–112)
CO2: 25 mmol/L (ref 19–32)
Creatinine, Ser: 0.81 mg/dL (ref 0.50–1.35)
GFR calc Af Amer: >90 mL/min (ref >90)
GLUCOSE: 75 mg/dL (ref 70–99)
POTASSIUM: 4 mmol/L (ref 3.5–5.1)
Sodium: 135 mmol/L (ref 135–145)

## 2014-07-31 LAB — CBC
HEMATOCRIT: 25.7 % — AB (ref 39.0–52.0)
Hemoglobin: 9 g/dL — ABNORMAL LOW (ref 13.0–17.0)
MCH: 32.4 pg (ref 26.0–34.0)
MCHC: 35 g/dL (ref 30.0–36.0)
MCV: 92.4 fL (ref 78.0–100.0)
Platelets: 35 10*3/uL — ABNORMAL LOW (ref 150–400)
RBC: 2.78 MIL/uL — ABNORMAL LOW (ref 4.22–5.81)
RDW: 17 % — AB (ref 11.5–15.5)
WBC: 2.7 10*3/uL — ABNORMAL LOW (ref 4.0–10.5)

## 2014-07-31 LAB — GLUCOSE, CAPILLARY
GLUCOSE-CAPILLARY: 80 mg/dL (ref 70–99)
GLUCOSE-CAPILLARY: 135 mg/dL — AB (ref 70–99)
Glucose-Capillary: 170 mg/dL — ABNORMAL HIGH (ref 70–99)
Glucose-Capillary: 157 mg/dL — ABNORMAL HIGH (ref 70–99)

## 2014-07-31 LAB — AMMONIA: Ammonia: 172 umol/L — ABNORMAL HIGH (ref 11–32)

## 2014-07-31 MED ORDER — THIAMINE HCL 100 MG PO TABS: 100.0000 mg | ORAL_TABLET | Freq: Every day | ORAL | Status: DC

## 2014-07-31 MED ORDER — FOLIC ACID 1 MG PO TABS: 1.0000 mg | ORAL_TABLET | Freq: Every day | ORAL | Status: DC

## 2014-07-31 MED ORDER — ADULT MULTIVITAMIN W/MINERALS CH: 1.0000 | ORAL_TABLET | Freq: Every day | ORAL | Status: AC

## 2014-07-31 MED ORDER — LACTULOSE 10 GM/15ML PO SOLN: 30.0000 g | Freq: Three times a day (TID) | ORAL | Status: DC

## 2014-07-31 MED ORDER — SPIRONOLACTONE 100 MG PO TABS: 100.0000 mg | ORAL_TABLET | Freq: Every day | ORAL | Status: DC

## 2014-07-31 MED ORDER — FUROSEMIDE 20 MG PO TABS: 20.0000 mg | ORAL_TABLET | Freq: Two times a day (BID) | ORAL | Status: DC

## 2014-07-31 MED ORDER — DIAZEPAM 2 MG PO TABS
2.0000 mg | ORAL_TABLET | Freq: Three times a day (TID) | ORAL | Status: DC | PRN
Start: 2014-07-31 — End: 2014-07-31

## 2014-07-31 MED ORDER — ALBUTEROL SULFATE (2.5 MG/3ML) 0.083% IN NEBU: 2.5000 mg | INHALATION_SOLUTION | RESPIRATORY_TRACT | Status: DC | PRN

## 2014-07-31 MED ORDER — ENSURE COMPLETE PO LIQD: 237.0000 mL | Freq: Two times a day (BID) | ORAL | Status: DC

## 2014-07-31 MED ORDER — DIAZEPAM 2 MG PO TABS: 2.0000 mg | ORAL_TABLET | Freq: Three times a day (TID) | ORAL | Status: DC | PRN

## 2014-07-31 NOTE — Clinical Social Work Placement (Addendum)
Clinical Social Work Department CLINICAL SOCIAL WORK PLACEMENT NOTE 07/31/2014  Patient:  [name], [name]  Account Number:  [id number]                 Admit date:  07/28/2014  Clinical Social Worker:  Jeff Frieden Givens, LCSW  Date/time:  07/31/2014 10:57 AM  Clinical Social Work is seeking post-discharge placement for this patient at the following level of care:   SKILLED NURSING   (*CSW will update this form in Epic as items are completed)   07/30/2014  Patient/family provided with [place] Department of Clinical Social Work's list of facilities offering this level of care within the geographic area requested by the patient (or if unable, by the patient's family).  07/30/2014  Patient/family informed of their freedom to choose among providers that offer the needed level of care, that participate in Medicare, Medicaid or managed care program needed by the patient, have an available bed and are willing to accept the patient.    Patient/family informed of [facility]' ownership interest in [facility], as well as of the fact that they are under no obligation to receive care at this facility.  PASARR submitted to EDS on 07/30/2014 PASARR number received on   FL2 transmitted to all facilities in geographic area requested by pt/family on  07/30/2014 FL2 transmitted to all facilities within larger geographic area on   Patient informed that his/her managed care company has contracts with or will negotiate with  certain facilities, including the following:  SNF authorization not received from [facility] on 07/31/14   Patient/family informed of bed offers received: 07/31/14  Patient chooses bed at [facility] Physician recommends and patient chooses bed at    Patient to be transferred to home on 08/01/14 - [employer] denied SNF rehab on 08/01/14. Patient to be discharged to home on 08/01/14  Patient and family notified of transfer on  Name of family member notified: [name]  The  following physician request were entered in Epic:  Additional Comments: 07/31/14: CSW was able to talk with patient's ex-girlfriend [name] [phone]) regarding patient's dishcarge to a skilled facility. She will complete admissions paperwork on Friday morning, 3/11 at [facility] and patient will be transported once authorization received.    Mahesh Sizemore Givens, MSW, LCSW Licensed Clinical Social Worker [place] [phone]

## 2014-07-31 NOTE — Clinical Social Work Psychosocial (Signed)
Clinical Social Work Department BRIEF PSYCHOSOCIAL ASSESSMENT 07/31/2014  Patient:  [name], [name]     Account Number:  [id number]     Admit date:  07/28/2014  Clinical Social Worker:  Frederico Hamman  Date/Time:  07/31/2014 10:40 AM  Referred by:  Physician  Date Referred:  07/30/2014 Referred for  SNF Placement   Other Referral:   Interview type:  Patient Other interview type:    PSYCHOSOCIAL DATA Living Status:  ALONE Admitted from facility:   Level of care:   Primary support name:  Serita Butcher Primary support relationship to patient:  FRIEND Degree of support available:   Ms. Belenda Cruise is patient's fiancee [phone]) and provides good support for him.    CURRENT CONCERNS Current Concerns  Post-Acute Placement   Other Concerns:    SOCIAL WORK ASSESSMENT / PLAN On 07/30/14 CSW talked with patient regarding discharge planning and recommendation of ST rehab. Patient was alert, oriented and open to talking with CSW. Mr. Giammarco was lying down when CSW entered room, but sat-up on the side of the bed to converse. Patient aware that he needs rehab and is agreeable, however he wants his fiancee to assist with choosing a facility and provided her phone number.    As CSW and patient were ending the conversation, patient became tearful and began to sob, but when asked about the change in his mood, patient nodded and did not response. CSW provided support and allowed patient to be alone.   Assessment/plan status:  Psychosocial Support/Ongoing Assessment of Needs Other assessment/ plan:   Information/referral to community resources:   Patient provided with skilled facility list for [place]: Patient receptive to speaking with CSW and in agreement with ST rehab.       Karin Griffith Givens, MSW, LCSW Licensed Clinical Social Worker [place] [phone]

## 2014-07-31 NOTE — Discharge Summary (Signed)
Physician Discharge Summary  HERMES WAFER KNL:[id number] DOB: [birth year] DOA: 07/28/2014  PCP: Barbette Merino, MD  Admit date: 07/28/2014 Discharge date: 07/31/2014  Time spent: 35 minutes  Recommendations for Outpatient Follow-up:  1. Cbc, bmp 1 week 2. Titrate lactulose for 3 BMs/day 3. To SNF  Discharge Diagnoses:  Active Problems:   Alcohol abuse   HTN (hypertension)   Diabetes mellitus   Cirrhosis   Acute encephalopathy   Encephalopathy, hepatic   Acute kidney injury   Hyperkalemia   Encephalopathy   Discharge Condition: improved  Diet recommendation: low Na/diabetic  Filed Weights   07/28/14 1901 07/28/14 2255 07/29/14 2100  Weight: 95.709 kg (211 lb) 97.251 kg (214 lb 6.4 oz) 99.973 kg (220 lb 6.4 oz)    History of present illness:  [name] is a 59 y.o. male presents with confusion and falls. Patient was at home and states that he had been having episodes of falling. Patient states he has not been drinking for the past three years. He states that he has been taking all his medications. He states that his fiancee helps him to take all his medications. He has noted generalized weakness also. He states that this was the cause of him falling around the house. He states he has not lost conscioussness. He denies having headaches. No fevers noted. He has no increased swelling of his abdomen.  Hospital Course:  1. 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 Diabetes Mellitus on Insulin Resume home meds  3. HTN -Will monitor pressures -hold lisinopril due to hyperkalemia and elevated creatinine   4. AKI (baseline 1 but may be higher now that taking lasix/aldactone) -monitor labs for now -hold lisinopril  5. Hyperkalemia -resolved  6. Edema -chronic per patient -lasix/aldactone  7. Left shoulder pain after fall- x  ray -no fractures  Procedures:    Consultations:    Discharge Exam: Filed Vitals:   07/31/14 1000  BP: 125/64  Pulse: 81  Temp: 98 F (36.7 C)  Resp: 18    General: A+Ox3, NAD Cardiovascular: rrr Respiratory: clear Belly large but soft  Discharge Instructions   Discharge Instructions    Diet - low sodium heart healthy    Complete by:  As directed      Discharge instructions    Complete by:  As directed   Cbc, bmp 1 week Lactulose goal of 3 BMs per day- may need to be adjusted up or down     Increase activity slowly    Complete by:  As directed           Current Discharge Medication List    START taking these medications   Details  albuterol (PROVENTIL) (2.5 MG/3ML) 0.083% nebulizer solution Take 3 mLs (2.5 mg total) by nebulization every 2 (two) hours as needed for wheezing or shortness of breath. Qty: 75 mL, Refills: 12    feeding supplement, ENSURE COMPLETE, (ENSURE COMPLETE) LIQD Take 237 mLs by mouth 2 (two) times daily between meals.    folic acid (FOLVITE) 1 MG tablet Take 1 tablet (1 mg total) by mouth daily.    Multiple Vitamin (MULTIVITAMIN WITH MINERALS) TABS tablet Take 1 tablet by mouth daily.    thiamine 100 MG tablet Take 1 tablet (100 mg total) by mouth daily.      CONTINUE these medications which have CHANGED   Details  diazepam (VALIUM) 2 MG  tablet Take 1 tablet (2 mg total) by mouth every 8 (eight) hours as needed for anxiety. Qty: 15 tablet, Refills: 0    furosemide (LASIX) 20 MG tablet Take 1 tablet (20 mg total) by mouth 2 (two) times daily. Qty: 30 tablet    lactulose (CHRONULAC) 10 GM/15ML solution Take 45 mLs (30 g total) by mouth 3 (three) times daily. Qty: 240 mL, Refills: 0    spironolactone (ALDACTONE) 100 MG tablet Take 1 tablet (100 mg total) by mouth daily.      CONTINUE these medications which have NOT CHANGED   Details  insulin aspart (NOVOLOG FLEXPEN) 100 UNIT/ML FlexPen Inject 6 Units into the skin 3 (three)  times daily with meals. For diabetes Qty: 15 mL, Refills: 11    Insulin Glargine (LANTUS SOLOSTAR) 100 UNIT/ML Solostar Pen Inject 38 units into the skin at bedtime: For diabetes Qty: 15 mL, Refills: 0    pantoprazole (PROTONIX) 40 MG tablet Take 40 mg by mouth 2 (two) times daily. Refills: 2    rifaximin (XIFAXAN) 550 MG TABS tablet Take 1 tablet (550 mg total) by mouth 2 (two) times daily. For IBS Qty: 14 tablet, Refills: 1      STOP taking these medications     amitriptyline (ELAVIL) 25 MG tablet      diclofenac sodium (VOLTAREN) 1 % GEL      lisinopril (PRINIVIL,ZESTRIL) 20 MG tablet      Oxycodone HCl 10 MG TABS        No Known Allergies    The results of significant diagnostics from this hospitalization (including imaging, microbiology, ancillary and laboratory) are listed below for reference.    Significant Diagnostic Studies: Ct Head Wo Contrast  07/28/2014   CLINICAL DATA:  Weakness and fatigue beginning 07/27/2014.  EXAM: CT HEAD WITHOUT CONTRAST  TECHNIQUE: Contiguous axial images were obtained from the base of the skull through the vertex without intravenous contrast.  COMPARISON:  Head CT scan 02/10/2014.  Brain MRI 09/13/2013.  FINDINGS: The brain appears normal without hemorrhage, infarct, mass lesion, mass effect, midline shift or abnormal extra-axial fluid collection. No hydrocephalus or pneumocephalus. The calvarium is intact. Trace amount of fluid in the left mastoid air cells is noted and unchanged. No imaged paranasal sinuses and right mastoid air cells are clear.  IMPRESSION: No acute abnormality.   Electronically Signed   By: Inge Rise M.D.   On: 07/28/2014 20:20   Dg Shoulder Left  07/30/2014   CLINICAL DATA:  [place] at home today.  Left shoulder pain.  EXAM: LEFT SHOULDER - 2+ VIEW  COMPARISON:  None.  FINDINGS: Examination is limited by clothing artifact.  The joint spaces are maintained. No acute fracture is identified. The visualized left upper lung  is clear.  IMPRESSION: Exam limited by clothing artifact.  No obvious fracture.   Electronically Signed   By: Marijo Sanes M.D.   On: 07/30/2014 15:28    Microbiology: No results found for this or any previous visit (from the past 240 hour(s)).   Labs: Basic Metabolic Panel:  Recent Labs Lab 07/28/14 1927 07/29/14 0713 07/30/14 0619 07/31/14 0650  NA 135 138 138 135  K 5.7* 3.6 4.1 4.0  CL 107 107 108 106  CO2 20 22 27 25   GLUCOSE 111* 140* 128* 75  BUN 17 18 16 13   CREATININE 1.39* 1.40* 0.93 0.81  CALCIUM 8.4 8.4 8.5 8.4   Liver Function Tests:  Recent Labs Lab 07/28/14 1927 07/29/14 0713  AST  75* 37  ALT 26 26  ALKPHOS 87 85  BILITOT 3.4* 2.4*  PROT 5.6* 5.3*  ALBUMIN 2.7* 2.4*   No results for input(s): LIPASE, AMYLASE in the last 168 hours.  Recent Labs Lab 07/28/14 2034 07/30/14 0619 07/31/14 0650  AMMONIA 74* 95* 172*   CBC:  Recent Labs Lab 07/28/14 1927 07/28/14 1950 07/29/14 0713 07/30/14 0619 07/31/14 0650  WBC 2.8*  --  3.0* 2.5* 2.7*  NEUTROABS  --  1.8  --   --   --   HGB 10.4*  --  9.5* 9.3* 9.0*  HCT 29.5*  --  28.2* 27.4* 25.7*  MCV 91.6  --  94.3 95.1 92.4  PLT 57*  --  39* 34* 35*   Cardiac Enzymes: No results for input(s): CKTOTAL, CKMB, CKMBINDEX, TROPONINI in the last 168 hours. BNP: BNP (last 3 results) No results for input(s): BNP in the last 8760 hours.  ProBNP (last 3 results)  Recent Labs  02/08/14 1830 02/10/14 1547  PROBNP 966.3* 805.5*    CBG:  Recent Labs Lab 07/30/14 0729 07/30/14 1200 07/30/14 1703 07/30/14 2136 07/31/14 0723  GLUCAP 102* 152* 154* 163* 80       Signed:  VANN, JESSICA  [facility] 07/31/2014, 10:54 AM

## 2014-08-01 LAB — GLUCOSE, CAPILLARY
GLUCOSE-CAPILLARY: 152 mg/dL — AB (ref 70–99)
Glucose-Capillary: 61 mg/dL — ABNORMAL LOW (ref 70–99)
Glucose-Capillary: 115 mg/dL — ABNORMAL HIGH (ref 70–99)
Glucose-Capillary: 160 mg/dL — ABNORMAL HIGH (ref 70–99)

## 2014-08-01 MED ORDER — FUROSEMIDE 20 MG PO TABS: 20.0000 mg | ORAL_TABLET | Freq: Two times a day (BID) | ORAL | Status: DC

## 2014-08-01 MED ORDER — LACTULOSE 10 GM/15ML PO SOLN: 30.0000 g | Freq: Three times a day (TID) | ORAL | Status: DC

## 2014-08-01 NOTE — Discharge Summary (Addendum)
Physician Discharge Summary  [name]:[id number] DOB: 1956-05-15 DOA: 07/28/2014  PCP: Barbette Merino, MD  Admit date: 07/28/2014 Discharge date: 08/01/2014  Time spent: 35 minutes  Recommendations for Outpatient Follow-up:  1. Cbc, bmp 1 week 2. Titrate lactulose for 3 BMs/day 3. Home health - RN as patient was denied SNF stay  Discharge Diagnoses:  Active Problems:   Alcohol abuse   HTN (hypertension)   Diabetes mellitus   Cirrhosis   Acute encephalopathy   Encephalopathy, hepatic   Acute kidney injury   Hyperkalemia   Encephalopathy   Discharge Condition: improved  Diet recommendation: low Na/diabetic  Filed Weights   07/28/14 2255 07/29/14 2100 07/31/14 2100  Weight: 97.251 kg (214 lb 6.4 oz) 99.973 kg (220 lb 6.4 oz) 99.927 kg (220 lb 4.8 oz)    History of present illness:  [name] is a 59 y.o. male presents with confusion and falls. Patient was at home and states that he had been having episodes of falling. Patient states he has not been drinking for the past three years. He states that he has been taking all his medications. He states that his fiancee helps him to take all his medications. He has noted generalized weakness also. He states that this was the cause of him falling around the house. He states he has not lost conscioussness. He denies having headaches. No fevers noted. He has no increased swelling of his abdomen.  Hospital Course:  1. 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 Diabetes Mellitus on Insulin Resume home meds  3. HTN -Will monitor pressures -hold lisinopril due to hyperkalemia and elevated creatinine   4. AKI (baseline 1 but may be higher now that taking lasix/aldactone) -monitor labs for now -hold lisinopril  5. Hyperkalemia -resolved  6. Edema -chronic per  patient -lasix/aldactone  7. Left shoulder pain after fall- x ray -no fractures  Procedures:    Consultations:    Discharge Exam: Filed Vitals:   08/01/14 0500  BP: 119/57  Pulse: 79  Temp: 98.1 F (36.7 C)  Resp: 16    General: A+Ox3, NAD Cardiovascular: rrr Respiratory: clear Belly large but soft  Discharge Instructions   Discharge Instructions    Diet - low sodium heart healthy    Complete by:  As directed      Discharge instructions    Complete by:  As directed   Cbc, bmp 1 week Lactulose goal of 3 BMs per day- may need to be adjusted up or down     Increase activity slowly    Complete by:  As directed           Current Discharge Medication List    START taking these medications   Details  albuterol (PROVENTIL) (2.5 MG/3ML) 0.083% nebulizer solution Take 3 mLs (2.5 mg total) by nebulization every 2 (two) hours as needed for wheezing or shortness of breath. Qty: 75 mL, Refills: 12    feeding supplement, ENSURE COMPLETE, (ENSURE COMPLETE) LIQD Take 237 mLs by mouth 2 (two) times daily between meals.    folic acid (FOLVITE) 1 MG tablet Take 1 tablet (1 mg total) by mouth daily.    Multiple Vitamin (MULTIVITAMIN WITH MINERALS) TABS tablet Take 1 tablet by mouth daily.    thiamine 100 MG tablet Take 1 tablet (100 mg total) by mouth daily.      CONTINUE these medications which  have CHANGED   Details  diazepam (VALIUM) 2 MG tablet Take 1 tablet (2 mg total) by mouth every 8 (eight) hours as needed for anxiety. Qty: 15 tablet, Refills: 0    furosemide (LASIX) 20 MG tablet Take 1 tablet (20 mg total) by mouth 2 (two) times daily. Qty: 30 tablet    lactulose (CHRONULAC) 10 GM/15ML solution Take 45 mLs (30 g total) by mouth 3 (three) times daily. Qty: 240 mL, Refills: 0    spironolactone (ALDACTONE) 100 MG tablet Take 1 tablet (100 mg total) by mouth daily.      CONTINUE these medications which have NOT CHANGED   Details  insulin aspart (NOVOLOG  FLEXPEN) 100 UNIT/ML FlexPen Inject 6 Units into the skin 3 (three) times daily with meals. For diabetes Qty: 15 mL, Refills: 11    Insulin Glargine (LANTUS SOLOSTAR) 100 UNIT/ML Solostar Pen Inject 38 units into the skin at bedtime: For diabetes Qty: 15 mL, Refills: 0    pantoprazole (PROTONIX) 40 MG tablet Take 40 mg by mouth 2 (two) times daily. Refills: 2    rifaximin (XIFAXAN) 550 MG TABS tablet Take 1 tablet (550 mg total) by mouth 2 (two) times daily. For IBS Qty: 14 tablet, Refills: 1      STOP taking these medications     amitriptyline (ELAVIL) 25 MG tablet      diclofenac sodium (VOLTAREN) 1 % GEL      lisinopril (PRINIVIL,ZESTRIL) 20 MG tablet      Oxycodone HCl 10 MG TABS        No Known Allergies Follow-up Information    Follow up with GARBA,LAWAL, MD In 1 week.   Specialty:  Internal Medicine   Why:  keep appointment with GI doctor   Contact information:   [address] [place] [phone]        The results of significant diagnostics from this hospitalization (including imaging, microbiology, ancillary and laboratory) are listed below for reference.    Significant Diagnostic Studies: Ct Head Wo Contrast  07/28/2014   CLINICAL DATA:  Weakness and fatigue beginning 07/27/2014.  EXAM: CT HEAD WITHOUT CONTRAST  TECHNIQUE: Contiguous axial images were obtained from the base of the skull through the vertex without intravenous contrast.  COMPARISON:  Head CT scan 02/10/2014.  Brain MRI 09/13/2013.  FINDINGS: The brain appears normal without hemorrhage, infarct, mass lesion, mass effect, midline shift or abnormal extra-axial fluid collection. No hydrocephalus or pneumocephalus. The calvarium is intact. Trace amount of fluid in the left mastoid air cells is noted and unchanged. No imaged paranasal sinuses and right mastoid air cells are clear.  IMPRESSION: No acute abnormality.   Electronically Signed   By: Inge Rise M.D.   On: 07/28/2014 20:20    Dg Shoulder Left  07/30/2014   CLINICAL DATA:  [place] at home today.  Left shoulder pain.  EXAM: LEFT SHOULDER - 2+ VIEW  COMPARISON:  None.  FINDINGS: Examination is limited by clothing artifact.  The joint spaces are maintained. No acute fracture is identified. The visualized left upper lung is clear.  IMPRESSION: Exam limited by clothing artifact.  No obvious fracture.   Electronically Signed   By: Marijo Sanes M.D.   On: 07/30/2014 15:28    Microbiology: No results found for this or any previous visit (from the past 240 hour(s)).   Labs: Basic Metabolic Panel:  Recent Labs Lab 07/28/14 1927 07/29/14 0713 07/30/14 0619 07/31/14 0650  NA 135 138 138 135  K  5.7* 3.6 4.1 4.0  CL 107 107 108 106  CO2 20 22 27 25   GLUCOSE 111* 140* 128* 75  BUN 17 18 16 13   CREATININE 1.39* 1.40* 0.93 0.81  CALCIUM 8.4 8.4 8.5 8.4   Liver Function Tests:  Recent Labs Lab 07/28/14 1927 07/29/14 0713  AST 75* 37  ALT 26 26  ALKPHOS 87 85  BILITOT 3.4* 2.4*  PROT 5.6* 5.3*  ALBUMIN 2.7* 2.4*   No results for input(s): LIPASE, AMYLASE in the last 168 hours.  Recent Labs Lab 07/28/14 2034 07/30/14 0619 07/31/14 0650  AMMONIA 74* 95* 172*   CBC:  Recent Labs Lab 07/28/14 1927 07/28/14 1950 07/29/14 0713 07/30/14 0619 07/31/14 0650  WBC 2.8*  --  3.0* 2.5* 2.7*  NEUTROABS  --  1.8  --   --   --   HGB 10.4*  --  9.5* 9.3* 9.0*  HCT 29.5*  --  28.2* 27.4* 25.7*  MCV 91.6  --  94.3 95.1 92.4  PLT 57*  --  39* 34* 35*   Cardiac Enzymes: No results for input(s): CKTOTAL, CKMB, CKMBINDEX, TROPONINI in the last 168 hours. BNP: BNP (last 3 results) No results for input(s): BNP in the last 8760 hours.  ProBNP (last 3 results)  Recent Labs  02/08/14 1830 02/10/14 1547  PROBNP 966.3* 805.5*    CBG:  Recent Labs Lab 07/31/14 1121 07/31/14 1612 07/31/14 2118 08/01/14 0751 08/01/14 0828  GLUCAP 170* 157* 135* 61* 152*       Signed:  Damarco Keysor  [facility] 08/01/2014, 8:56 AM

## 2014-08-01 NOTE — Care Management Note (Signed)
CARE MANAGEMENT NOTE 08/01/2014  Patient:  [name], [name]   Account Number:  [id number]  Date Initiated:  08/01/2014  Documentation initiated by:  Maxey Ransom  Subjective/Objective Assessment:   CM has been following for d/c need and progression.     Action/Plan:   07/31/14 Met with CSW and pt to assist pt in d/c planning. Final plan was to d/c to SNF for short term rehab.  08/01/2014 Met with pt who is very alert today and pleased with his plan for short term SNF, IM given.   Anticipated DC Date:  08/01/2014   Anticipated DC Plan:  [place]         Choice offered to / List presented to:          [facility] arranged  HH-1 RN      [place].   Status of service:  Completed, signed off Medicare Important Message given?  YES (If response is "NO", the following Medicare IM given date fields will be blank) Date Medicare IM given:  08/01/2014 Medicare IM given by:  Shanaya Schneck Date Additional Medicare IM given:   Additional Medicare IM given by:    Discharge Disposition:  [place]  Per UR Regulation:    If discussed at Long Length of Stay Meetings, dates discussed:    Comments:  08/01/14 Notified by CSW that pt insurance has denied SNF rehab services. Pt will need HHRN for Medication management. Spoke with [facility] , they are out of network with this pt insurance, [facility] is able to provide services for this pt. Multiple attempts to contact pt friend, Serita Butcher and message left, no response as of 1645 today. Pt is unable to make a decision with out her input therefore this CM arranged HH with [facility] as they are in pt network. Jasmine Pang RN MPH, case manager [phone]

## 2014-08-01 NOTE — Progress Notes (Signed)
PT Cancellation Note  Patient Details Name: DAMACIO WEISGERBER MRN: [id number] DOB: 12-Nov-1955   Cancelled Treatment:    Reason Eval/Treat Not Completed: Pt to d/c to SNF today and confirmed with CSW. Will defer further PT treatment to SNF. Will continue to follow until d/c.    Rolinda Roan 08/01/2014, 1:21 PM   Rolinda Roan, PT, DPT Acute Rehabilitation Services Pager: [phone]

## 2014-08-01 NOTE — Care Management Note (Signed)
CARE MANAGEMENT NOTE 08/01/2014  Patient:  [name], [name]   Account Number:  [id number]  Date Initiated:  08/01/2014  Documentation initiated by:  Gustavo Meditz  Subjective/Objective Assessment:   CM has been following for d/c need and progression.     Action/Plan:   07/31/14 Met with CSW and pt to assist pt in d/c planning. Final plan was to d/c to SNF for short term rehab.  08/01/2014 Met with pt who is very alert today and pleased with his plan for short term SNF, IM given.   Anticipated DC Date:  08/01/2014   Anticipated DC Plan:  SKILLED NURSING FACILITY         Choice offered to / List presented to:             Status of service:  Completed, signed off Medicare Important Message given?  YES (If response is "NO", the following Medicare IM given date fields will be blank) Date Medicare IM given:  08/01/2014 Medicare IM given by:  Dyane Broberg Date Additional Medicare IM given:   Additional Medicare IM given by:    Discharge Disposition:  [place]  Per UR Regulation:    If discussed at Long Length of Stay Meetings, dates discussed:    Comments:

## 2014-08-01 NOTE — Progress Notes (Signed)
Patient Discharge: Disposition: Patient discharged to home with home health. Education: Educated about medication, prescription, discharge instructions, and follow-up appointment. IV: Discontinued before discharge. Transportation: Patient walked accompanied by his girl friend. Belongings: Patient took all his belongings with him.

## 2014-08-14 ENCOUNTER — Encounter (HOSPITAL_COMMUNITY): Payer: Self-pay | Admitting: Emergency Medicine

## 2014-08-14 ENCOUNTER — Inpatient Hospital Stay (HOSPITAL_COMMUNITY): Payer: Medicare PPO

## 2014-08-14 ENCOUNTER — Inpatient Hospital Stay (HOSPITAL_COMMUNITY)
Admission: EM | Admit: 2014-08-14 | Discharge: 2014-08-18 | DRG: 441 | Disposition: A | Discharge status: Home Health | Payer: Medicare PPO | Attending: Internal Medicine | Admitting: Internal Medicine

## 2014-08-14 ENCOUNTER — Emergency Department (HOSPITAL_COMMUNITY): Payer: Medicare PPO

## 2014-08-14 DIAGNOSIS — N179 Acute kidney failure, unspecified: Secondary | ICD-10-CM | POA: Diagnosis present

## 2014-08-14 DIAGNOSIS — E119 Type 2 diabetes mellitus without complications: Secondary | ICD-10-CM | POA: Diagnosis not present

## 2014-08-14 DIAGNOSIS — R4182 Altered mental status, unspecified: Secondary | ICD-10-CM

## 2014-08-14 DIAGNOSIS — R52 Pain, unspecified: Secondary | ICD-10-CM

## 2014-08-14 DIAGNOSIS — J9602 Acute respiratory failure with hypercapnia: Secondary | ICD-10-CM | POA: Diagnosis present

## 2014-08-14 DIAGNOSIS — M25519 Pain in unspecified shoulder: Secondary | ICD-10-CM | POA: Diagnosis present

## 2014-08-14 DIAGNOSIS — Z79899 Other long term (current) drug therapy: Secondary | ICD-10-CM | POA: Diagnosis not present

## 2014-08-14 DIAGNOSIS — F17210 Nicotine dependence, cigarettes, uncomplicated: Secondary | ICD-10-CM | POA: Diagnosis present

## 2014-08-14 DIAGNOSIS — M1990 Unspecified osteoarthritis, unspecified site: Secondary | ICD-10-CM | POA: Diagnosis present

## 2014-08-14 DIAGNOSIS — E875 Hyperkalemia: Secondary | ICD-10-CM | POA: Diagnosis present

## 2014-08-14 DIAGNOSIS — E1140 Type 2 diabetes mellitus with diabetic neuropathy, unspecified: Secondary | ICD-10-CM | POA: Diagnosis present

## 2014-08-14 DIAGNOSIS — F1021 Alcohol dependence, in remission: Secondary | ICD-10-CM | POA: Diagnosis present

## 2014-08-14 DIAGNOSIS — G8929 Other chronic pain: Secondary | ICD-10-CM | POA: Diagnosis present

## 2014-08-14 DIAGNOSIS — B952 Enterococcus as the cause of diseases classified elsewhere: Secondary | ICD-10-CM | POA: Diagnosis present

## 2014-08-14 DIAGNOSIS — I10 Essential (primary) hypertension: Secondary | ICD-10-CM | POA: Diagnosis present

## 2014-08-14 DIAGNOSIS — D6959 Other secondary thrombocytopenia: Secondary | ICD-10-CM | POA: Diagnosis present

## 2014-08-14 DIAGNOSIS — J449 Chronic obstructive pulmonary disease, unspecified: Secondary | ICD-10-CM | POA: Diagnosis present

## 2014-08-14 DIAGNOSIS — K7682 Hepatic encephalopathy: Secondary | ICD-10-CM

## 2014-08-14 DIAGNOSIS — J9601 Acute respiratory failure with hypoxia: Secondary | ICD-10-CM | POA: Diagnosis present

## 2014-08-14 DIAGNOSIS — F319 Bipolar disorder, unspecified: Secondary | ICD-10-CM | POA: Diagnosis present

## 2014-08-14 DIAGNOSIS — D696 Thrombocytopenia, unspecified: Secondary | ICD-10-CM | POA: Diagnosis present

## 2014-08-14 DIAGNOSIS — K219 Gastro-esophageal reflux disease without esophagitis: Secondary | ICD-10-CM | POA: Diagnosis present

## 2014-08-14 DIAGNOSIS — R0902 Hypoxemia: Secondary | ICD-10-CM | POA: Diagnosis not present

## 2014-08-14 DIAGNOSIS — G9340 Encephalopathy, unspecified: Secondary | ICD-10-CM | POA: Diagnosis not present

## 2014-08-14 DIAGNOSIS — Z8673 Personal history of transient ischemic attack (TIA), and cerebral infarction without residual deficits: Secondary | ICD-10-CM | POA: Diagnosis not present

## 2014-08-14 DIAGNOSIS — K2270 Barrett's esophagus without dysplasia: Secondary | ICD-10-CM | POA: Diagnosis present

## 2014-08-14 DIAGNOSIS — D649 Anemia, unspecified: Secondary | ICD-10-CM | POA: Diagnosis present

## 2014-08-14 DIAGNOSIS — R7989 Other specified abnormal findings of blood chemistry: Secondary | ICD-10-CM | POA: Diagnosis not present

## 2014-08-14 DIAGNOSIS — R601 Generalized edema: Secondary | ICD-10-CM | POA: Diagnosis not present

## 2014-08-14 DIAGNOSIS — K7030 Alcoholic cirrhosis of liver without ascites: Secondary | ICD-10-CM | POA: Diagnosis present

## 2014-08-14 DIAGNOSIS — Z8659 Personal history of other mental and behavioral disorders: Secondary | ICD-10-CM

## 2014-08-14 DIAGNOSIS — R40243 Glasgow coma scale score 3-8, unspecified time: Secondary | ICD-10-CM

## 2014-08-14 DIAGNOSIS — K7290 Hepatic failure, unspecified without coma: Principal | ICD-10-CM | POA: Diagnosis present

## 2014-08-14 DIAGNOSIS — Z794 Long term (current) use of insulin: Secondary | ICD-10-CM | POA: Diagnosis not present

## 2014-08-14 DIAGNOSIS — K7460 Unspecified cirrhosis of liver: Secondary | ICD-10-CM | POA: Diagnosis present

## 2014-08-14 LAB — COMPREHENSIVE METABOLIC PANEL
ALBUMIN: 3 g/dL — AB (ref 3.5–5.2)
ALT: 27 U/L (ref 0–53)
AST: 51 U/L — AB (ref 0–37)
Alkaline Phosphatase: 117 U/L (ref 39–117)
Anion gap: 6 (ref 5–15)
BUN: 19 mg/dL (ref 6–23)
CALCIUM: 8.3 mg/dL — AB (ref 8.4–10.5)
CO2: 28 mmol/L (ref 19–32)
CREATININE: 1.45 mg/dL — AB (ref 0.50–1.35)
Chloride: 105 mmol/L (ref 96–112)
GFR calc Af Amer: 60 mL/min — ABNORMAL LOW (ref >90)
GFR, EST NON AFRICAN AMERICAN: 52 mL/min — AB (ref >90)
Glucose, Bld: 94 mg/dL (ref 70–99)
POTASSIUM: 5.8 mmol/L — AB (ref 3.5–5.1)
SODIUM: 139 mmol/L (ref 135–145)
Total Bilirubin: 2.6 mg/dL — ABNORMAL HIGH (ref 0.3–1.2)
Total Protein: 6.6 g/dL (ref 6.0–8.3)

## 2014-08-14 LAB — CBC
HEMATOCRIT: 30.5 % — AB (ref 39.0–52.0)
HEMOGLOBIN: 10.2 g/dL — AB (ref 13.0–17.0)
MCH: 32.4 pg (ref 26.0–34.0)
MCHC: 33.4 g/dL (ref 30.0–36.0)
MCV: 96.8 fL (ref 78.0–100.0)
Platelets: 48 10*3/uL — ABNORMAL LOW (ref 150–400)
RBC: 3.15 MIL/uL — ABNORMAL LOW (ref 4.22–5.81)
RDW: 17.2 % — ABNORMAL HIGH (ref 11.5–15.5)
WBC: 3.1 10*3/uL — ABNORMAL LOW (ref 4.0–10.5)

## 2014-08-14 LAB — URINALYSIS, ROUTINE W REFLEX MICROSCOPIC
BILIRUBIN URINE: NEGATIVE
Glucose, UA: NEGATIVE mg/dL
HGB URINE DIPSTICK: NEGATIVE
Ketones, ur: NEGATIVE mg/dL
Leukocytes, UA: NEGATIVE
Nitrite: NEGATIVE
PROTEIN: NEGATIVE mg/dL
Specific Gravity, Urine: 1.018 (ref 1.005–1.030)
UROBILINOGEN UA: 0.2 mg/dL (ref 0.0–1.0)
pH: 5 (ref 5.0–8.0)

## 2014-08-14 LAB — PROCALCITONIN: PROCALCITONIN: 0.1 ng/mL

## 2014-08-14 LAB — I-STAT TROPONIN, ED: Troponin i, poc: 0.01 ng/mL (ref 0.00–0.08)

## 2014-08-14 LAB — I-STAT ARTERIAL BLOOD GAS, ED
Acid-Base Excess: 2 mmol/L (ref 0.0–2.0)
Bicarbonate: 28.3 mEq/L — ABNORMAL HIGH (ref 20.0–24.0)
O2 Saturation: 100 %
TCO2: 30 mmol/L (ref 0–100)
pCO2 arterial: 53.7 mmHg — ABNORMAL HIGH (ref 35.0–45.0)
pH, Arterial: 7.33 — ABNORMAL LOW (ref 7.350–7.450)
pO2, Arterial: 416 mmHg — ABNORMAL HIGH (ref 80.0–100.0)

## 2014-08-14 LAB — LIPASE, BLOOD: LIPASE: 27 U/L (ref 11–59)

## 2014-08-14 LAB — AMYLASE: AMYLASE: 31 U/L (ref 0–105)

## 2014-08-14 LAB — BASIC METABOLIC PANEL
ANION GAP: 8 (ref 5–15)
BUN: 20 mg/dL (ref 6–23)
CALCIUM: 8.2 mg/dL — AB (ref 8.4–10.5)
CO2: 26 mmol/L (ref 19–32)
Chloride: 103 mmol/L (ref 96–112)
Creatinine, Ser: 1.36 mg/dL — ABNORMAL HIGH (ref 0.50–1.35)
GFR calc non Af Amer: 56 mL/min — ABNORMAL LOW (ref >90)
GFR, EST AFRICAN AMERICAN: 65 mL/min — AB (ref >90)
Glucose, Bld: 97 mg/dL (ref 70–99)
Potassium: 4.9 mmol/L (ref 3.5–5.1)
SODIUM: 137 mmol/L (ref 135–145)

## 2014-08-14 LAB — GLUCOSE, CAPILLARY
GLUCOSE-CAPILLARY: 85 mg/dL (ref 70–99)
Glucose-Capillary: 71 mg/dL (ref 70–99)

## 2014-08-14 LAB — CBC WITH DIFFERENTIAL/PLATELET
Basophils Absolute: 0 10*3/uL (ref 0.0–0.1)
Basophils Relative: 1 % (ref 0–1)
EOS ABS: 0.3 10*3/uL (ref 0.0–0.7)
Eosinophils Relative: 6 % — ABNORMAL HIGH (ref 0–5)
HCT: 31.6 % — ABNORMAL LOW (ref 39.0–52.0)
HEMOGLOBIN: 10.5 g/dL — AB (ref 13.0–17.0)
LYMPHS ABS: 0.8 10*3/uL (ref 0.7–4.0)
Lymphocytes Relative: 19 % (ref 12–46)
MCH: 32.2 pg (ref 26.0–34.0)
MCHC: 33.2 g/dL (ref 30.0–36.0)
MCV: 96.9 fL (ref 78.0–100.0)
MONOS PCT: 10 % (ref 3–12)
Monocytes Absolute: 0.5 10*3/uL (ref 0.1–1.0)
NEUTROS PCT: 65 % (ref 43–77)
Neutro Abs: 2.8 10*3/uL (ref 1.7–7.7)
Platelets: 55 10*3/uL — ABNORMAL LOW (ref 150–400)
RBC: 3.26 MIL/uL — ABNORMAL LOW (ref 4.22–5.81)
RDW: 17.2 % — AB (ref 11.5–15.5)
WBC: 4.4 10*3/uL (ref 4.0–10.5)

## 2014-08-14 LAB — I-STAT CHEM 8, ED
BUN: 23 mg/dL (ref 6–23)
CHLORIDE: 104 mmol/L (ref 96–112)
Calcium, Ion: 1.06 mmol/L — ABNORMAL LOW (ref 1.12–1.23)
Creatinine, Ser: 1.3 mg/dL (ref 0.50–1.35)
Glucose, Bld: 96 mg/dL (ref 70–99)
HEMATOCRIT: 33 % — AB (ref 39.0–52.0)
Hemoglobin: 11.2 g/dL — ABNORMAL LOW (ref 13.0–17.0)
POTASSIUM: 5.7 mmol/L — AB (ref 3.5–5.1)
SODIUM: 138 mmol/L (ref 135–145)
TCO2: 24 mmol/L (ref 0–100)

## 2014-08-14 LAB — PROTIME-INR
INR: 1.53 — AB (ref 0.00–1.49)
PROTHROMBIN TIME: 18.5 s — AB (ref 11.6–15.2)

## 2014-08-14 LAB — RAPID URINE DRUG SCREEN, HOSP PERFORMED
Amphetamines: NOT DETECTED
Barbiturates: NOT DETECTED
Benzodiazepines: POSITIVE — AB
Cocaine: NOT DETECTED
Opiates: POSITIVE — AB
Tetrahydrocannabinol: NOT DETECTED

## 2014-08-14 LAB — BRAIN NATRIURETIC PEPTIDE: B NATRIURETIC PEPTIDE 5: 29.3 pg/mL (ref 0.0–100.0)

## 2014-08-14 LAB — TROPONIN I: Troponin I: <0.03 ng/mL (ref <0.031)

## 2014-08-14 LAB — LACTIC ACID, PLASMA: Lactic Acid, Venous: 1.8 mmol/L (ref 0.5–2.0)

## 2014-08-14 LAB — MRSA PCR SCREENING: MRSA BY PCR: NEGATIVE

## 2014-08-14 LAB — CBG MONITORING, ED: Glucose-Capillary: 119 mg/dL — ABNORMAL HIGH (ref 70–99)

## 2014-08-14 LAB — I-STAT CG4 LACTIC ACID, ED: Lactic Acid, Venous: 1.76 mmol/L (ref 0.5–2.0)

## 2014-08-14 LAB — ACETAMINOPHEN LEVEL: Acetaminophen (Tylenol), Serum: <10 ug/mL — ABNORMAL LOW (ref 10–30)

## 2014-08-14 LAB — ETHANOL

## 2014-08-14 LAB — SALICYLATE LEVEL: Salicylate Lvl: <4 mg/dL (ref 2.8–20.0)

## 2014-08-14 LAB — TRIGLYCERIDES: TRIGLYCERIDES: 103 mg/dL (ref <150)

## 2014-08-14 LAB — AMMONIA: Ammonia: 83 umol/L — ABNORMAL HIGH (ref 11–32)

## 2014-08-14 MED ORDER — LACTULOSE 10 GM/15ML PO SOLN
30.0000 g | Freq: Three times a day (TID) | ORAL | Status: DC
  Administered 2014-08-15 – 2014-08-16 (×5): 30 g
  Filled 2014-08-14 (×9): qty 45

## 2014-08-14 MED ORDER — RIFAXIMIN 550 MG PO TABS
550.0000 mg | ORAL_TABLET | Freq: Two times a day (BID) | ORAL | Status: DC
  Administered 2014-08-14 – 2014-08-16 (×4): 550 mg
  Filled 2014-08-14 (×7): qty 1

## 2014-08-14 MED ORDER — INSULIN ASPART 100 UNIT/ML ~~LOC~~ SOLN
0.0000 [IU] | SUBCUTANEOUS | Status: DC
  Administered 2014-08-16: 2 [IU] via SUBCUTANEOUS

## 2014-08-14 MED ORDER — ALBUTEROL SULFATE (2.5 MG/3ML) 0.083% IN NEBU: 2.5000 mg | INHALATION_SOLUTION | RESPIRATORY_TRACT | Status: DC | PRN

## 2014-08-14 MED ORDER — FENTANYL CITRATE 0.05 MG/ML IJ SOLN
100.0000 ug | INTRAMUSCULAR | Status: DC | PRN
Start: 2014-08-14 — End: 2014-08-15

## 2014-08-14 MED ORDER — FOLIC ACID 1 MG PO TABS
1.0000 mg | ORAL_TABLET | Freq: Every day | ORAL | Status: DC
  Administered 2014-08-15 – 2014-08-16 (×2): 1 mg
  Filled 2014-08-14 (×3): qty 1

## 2014-08-14 MED ORDER — PROPOFOL 10 MG/ML IV EMUL
5.0000 ug/kg/min | Freq: Once | INTRAVENOUS | Status: AC
  Administered 2014-08-14: 5 ug/kg/min via INTRAVENOUS
  Filled 2014-08-14: qty 100

## 2014-08-14 MED ORDER — SUCCINYLCHOLINE CHLORIDE 20 MG/ML IJ SOLN
INTRAMUSCULAR | Status: AC | PRN
  Administered 2014-08-14: 140 mg via INTRAVENOUS

## 2014-08-14 MED ORDER — CHLORHEXIDINE GLUCONATE 0.12 % MT SOLN
15.0000 mL | Freq: Two times a day (BID) | OROMUCOSAL | Status: DC
  Administered 2014-08-14 – 2014-08-15 (×3): 15 mL via OROMUCOSAL
  Filled 2014-08-14 (×3): qty 15

## 2014-08-14 MED ORDER — PANTOPRAZOLE SODIUM 40 MG IV SOLR
40.0000 mg | Freq: Every day | INTRAVENOUS | Status: DC
  Administered 2014-08-14 – 2014-08-16 (×3): 40 mg via INTRAVENOUS
  Filled 2014-08-14 (×5): qty 40

## 2014-08-14 MED ORDER — ETOMIDATE 2 MG/ML IV SOLN
INTRAVENOUS | Status: AC | PRN
  Administered 2014-08-14: 20 mg via INTRAVENOUS

## 2014-08-14 MED ORDER — PROPOFOL 10 MG/ML IV EMUL
0.0000 ug/kg/min | INTRAVENOUS | Status: DC
  Administered 2014-08-14: 10 ug/kg/min via INTRAVENOUS
  Filled 2014-08-14: qty 100

## 2014-08-14 MED ORDER — CETYLPYRIDINIUM CHLORIDE 0.05 % MT LIQD
7.0000 mL | Freq: Four times a day (QID) | OROMUCOSAL | Status: DC
  Administered 2014-08-15 (×3): 7 mL via OROMUCOSAL

## 2014-08-14 MED ORDER — VITAMIN B-1 100 MG PO TABS
100.0000 mg | ORAL_TABLET | Freq: Every day | ORAL | Status: DC
  Administered 2014-08-15 – 2014-08-16 (×2): 100 mg
  Filled 2014-08-14 (×3): qty 1

## 2014-08-14 MED ORDER — FENTANYL CITRATE 0.05 MG/ML IJ SOLN: 100.0000 ug | INTRAMUSCULAR | Status: DC | PRN

## 2014-08-14 MED ORDER — SODIUM CHLORIDE 0.9 % IV SOLN
INTRAVENOUS | Status: DC
Start: 2014-08-14 — End: 2014-08-15
  Administered 2014-08-14 – 2014-08-15 (×2): via INTRAVENOUS

## 2014-08-14 MED ORDER — ONDANSETRON HCL 4 MG/2ML IJ SOLN: 4.0000 mg | Freq: Four times a day (QID) | INTRAMUSCULAR | Status: DC | PRN

## 2014-08-14 NOTE — ED Notes (Signed)
Pt returned from CT in no apparent distress  

## 2014-08-14 NOTE — ED Notes (Signed)
Patient transported to CT 

## 2014-08-14 NOTE — H&P (Signed)
PULMONARY / CRITICAL CARE MEDICINE   Name: [name] MRN: [id number] DOB: March 17, 1956    ADMISSION DATE:  08/14/2014 CONSULTATION DATE:  08/14/2014  REFERRING MD :  EDP Dr. Ralene Bathe  CHIEF COMPLAINT:  AMS  INITIAL PRESENTATION: 60 year old male with PMH of alcoholic cirrhosis presented to [facility] ED after being found down at home. On EMS arrival he reportedly had GCS of 3. He was intubated in ED. PCCM to admit.  STUDIES:  3/24 CT head >>   SIGNIFICANT EVENTS: 3/7 - 3/11 Admission to [facility] for AMS, hepatic encephalopathy.  HISTORY OF PRESENT ILLNESS:  59 year old male with PMH as below, which includes DM, Alcoholic Cirrhosis, HTN, CVA, COPD, and cocaine abuse. He was recently admitted to [facility] with AMS after running out of lactulose and rifaximin. He was provided with those therapies and he improved. Discharged 3/11. 3/24 he was found down at home. Upon EMS arrival he was noted to have GCS 3 and respiratory rate of 4. EMS attempted to nasally intubate, but was unsuccessful. They bagged him to ED where he was intubated for airway protection. PCCM called for admission.   PAST MEDICAL HISTORY :   has a past medical history of Neuropathy; Diabetes mellitus; Bipolar affect, depressed; Hypertension; Arthritis; Stroke; Cirrhosis; Alcohol abuse; Chronic pain; Cocaine abuse; Muscle spasm; Encephalopathy, hepatic; Detached retina; COPD (chronic obstructive pulmonary disease); Bronchitis; Barrett's esophagus; GERD (gastroesophageal reflux disease); and Anemia.  has past surgical history that includes Fracture surgery; Esophagogastroduodenoscopy (04/04/2012); Esophagogastroduodenoscopy (Left, 03/13/2013); Eye surgery (8 months ago both eyes); Vasectomy; Pars plana vitrectomy (Left, 07/08/2013); Intraocular lens removal (Left, 07/08/2013); Placement and suture of secondary intraoculaer lens (Left, 07/08/2013); Esophagogastroduodenoscopy (N/A, 01/16/2014); and Colonoscopy (N/A, 01/17/2014). Prior to Admission medications    Medication Sig Start Date End Date Taking? Authorizing Provider  amitriptyline (ELAVIL) 25 MG tablet Take 25 mg by mouth at bedtime.   Yes Historical Provider, MD  diclofenac sodium (VOLTAREN) 1 % GEL Apply 4 g topically as needed (for back of leg pain).   Yes Historical Provider, MD  furosemide (LASIX) 40 MG tablet Take 40 mg by mouth daily.   Yes Historical Provider, MD  gabapentin (NEURONTIN) 600 MG tablet Take 600 mg by mouth 3 (three) times daily.   Yes Historical Provider, MD  lactulose (CHRONULAC) 10 GM/15ML solution Take 45 mLs (30 g total) by mouth 3 (three) times daily. Patient taking differently: Take 30 g by mouth 4 (four) times daily.  08/01/14  Yes Jessica U Vann, DO  pantoprazole (PROTONIX) 40 MG tablet Take 40 mg by mouth 2 (two) times daily. 07/09/14  Yes Historical Provider, MD  diazepam (VALIUM) 2 MG tablet Take 1 tablet (2 mg total) by mouth every 8 (eight) hours as needed for anxiety. 07/31/14   Geradine Girt, DO  folic acid (FOLVITE) 1 MG tablet Take 1 tablet (1 mg total) by mouth daily. 07/31/14   Geradine Girt, DO  furosemide (LASIX) 20 MG tablet Take 1 tablet (20 mg total) by mouth 2 (two) times daily. 08/01/14   Geradine Girt, DO  insulin aspart (NOVOLOG FLEXPEN) 100 UNIT/ML FlexPen Inject 6 Units into the skin 3 (three) times daily with meals. For diabetes 02/19/14   Encarnacion Slates, NP  Insulin Glargine (LANTUS SOLOSTAR) 100 UNIT/ML Solostar Pen Inject 38 units into the skin at bedtime: For diabetes Patient taking differently: Inject 45 Units into the skin daily at 10 pm.  02/19/14   Encarnacion Slates, NP  lisinopril (PRINIVIL,ZESTRIL)  20 MG tablet Take 20 mg by mouth daily.    Historical Provider, MD  Multiple Vitamin (MULTIVITAMIN WITH MINERALS) TABS tablet Take 1 tablet by mouth daily. 07/31/14   Geradine Girt, DO  rifaximin (XIFAXAN) 550 MG TABS tablet Take 1 tablet (550 mg total) by mouth 2 (two) times daily. For IBS 02/19/14   Encarnacion Slates, NP  spironolactone (ALDACTONE)  100 MG tablet Take 1 tablet (100 mg total) by mouth daily. 07/31/14   Geradine Girt, DO  thiamine 100 MG tablet Take 1 tablet (100 mg total) by mouth daily. 07/31/14   Geradine Girt, DO   No Known Allergies  FAMILY HISTORY:  indicated that his mother is alive. He indicated that his father is deceased. He indicated that his maternal grandmother is deceased. He indicated that his maternal grandfather is deceased. He indicated that his paternal grandmother is deceased. He indicated that his paternal grandfather is deceased.  SOCIAL HISTORY:  reports that he has been smoking Cigarettes.  He has a 30 pack-year smoking history. He has never used smokeless tobacco. He reports that he drinks alcohol. He reports that he uses illicit drugs (Cocaine).  REVIEW OF SYSTEMS:  Unable, intubated  SUBJECTIVE:   VITAL SIGNS: Temp:  [97.3 F (36.3 C)-98.1 F (36.7 C)] 97.3 F (36.3 C) (03/24 1545) Pulse Rate:  [73-92] 73 (03/24 1545) Resp:  [9-16] 16 (03/24 1545) BP: (127-152)/(60-86) 129/60 mmHg (03/24 1545) SpO2:  [100 %] 100 % (03/24 1545) FiO2 (%):  [100 %] 100 % (03/24 1450) HEMODYNAMICS:   VENTILATOR SETTINGS: Vent Mode:  [-] PRVC FiO2 (%):  [100 %] 100 % Set Rate:  [16 bmp] 16 bmp Vt Set:  [580 mL] 580 mL PEEP:  [5 cmH20] 5 cmH20 Plateau Pressure:  [20 cmH20] 20 cmH20 INTAKE / OUTPUT: No intake or output data in the 24 hours ending 08/14/14 1604  PHYSICAL EXAMINATION: General:  Male of normal body habitus in NAD Neuro:  Sedated on vent. RASS -2 HEENT:  [place]/AT, PERRL, no JVD Cardiovascular:  RRR, no MRG Lungs:  Diffusely course Abdomen:  Soft, non-distended Musculoskeletal:  No acute deformity Skin:  +3 pitting edema to BLE.   LABS:  CBC  Recent Labs Lab 08/14/14 1445 08/14/14 1503  WBC 4.4  --   HGB 10.5* 11.2*  HCT 31.6* 33.0*  PLT 55*  --    Coag's No results for input(s): APTT, INR in the last 168 hours. BMET  Recent Labs Lab 08/14/14 1445 08/14/14 1503  NA 139  138  K 5.8* 5.7*  CL 105 104  CO2 28  --   BUN 19 23  CREATININE 1.45* 1.30  GLUCOSE 94 96   Electrolytes  Recent Labs Lab 08/14/14 1445  CALCIUM 8.3*   Sepsis Markers  Recent Labs Lab 08/14/14 1502  LATICACIDVEN 1.76   ABG  Recent Labs Lab 08/14/14 1544  PHART 7.330*  PCO2ART 53.7*  PO2ART 416.0*   Liver Enzymes  Recent Labs Lab 08/14/14 1445  AST 51*  ALT 27  ALKPHOS 117  BILITOT 2.6*  ALBUMIN 3.0*   Cardiac Enzymes No results for input(s): TROPONINI, PROBNP in the last 168 hours. Glucose No results for input(s): GLUCAP in the last 168 hours.  Imaging No results found.   ASSESSMENT / PLAN:  PULMONARY OETT 3/24 >> A: Acute hypercarbic/hypoxemic respiratory failure due to altered mental status likely from high ammonia. COPD no exacerbation. P:   Full vent support ABG Follow CXR VAP bundle See GI  section.  CARDIOVASCULAR A:  H/o HTN ? CHF P:  Telemetry monitoring Echo ordered.  RENAL A:   AKI Hyperkalemia due supplementation. P:   Holding lasix, spironolactone Kayexalate   GASTROINTESTINAL A:   Alcoholic cirrhosis ? GIB, suspect bloody oral secretions from traumatic intubation.   P:   Check amylase, lipase, EtOH level Ammonia 83 Lactulose. See neuro IV PPI  HEMATOLOGIC A:   Anemia of chronic disease ? Acute component from traumatic nasal intubation attempt.   P:  CBC q 6 hours Transfuse per ICU guidelines CBC pending  INFECTIOUS A:   No acute issues  P:   [facility] 3/24 >> Monitor off ABX Check PCT  ENDOCRINE A:   DM P:   CBG monitoring and SSI Check cortisol  NEUROLOGIC A:   Acute metabolic encephalopathy EtOH abuse (reportedly clean for 3 years) H/o substance abuse (opiates benzo + on UDS) P:   RASS goal: 0 to 1 Check ammonia Lactulose Rifaximin Propofol gtt  PRN fentanyl for sedation Hold amitriptyline  FAMILY  - Updates: No family at bedside  - Inter-disciplinary family meet or  Palliative Care meeting due by: 3/31  Georgann Housekeeper, AGACNP-BC [place] Pulmonology/Critical Care Pager [phone] or [phone]  Above note edited in full.  High ammonia resulting in AMS and VDRF.  Vitals stable.  Lactulose as ordered.  F/U ammonia not necessary.  Sedation as ordered.  Full vent support.  The patient is critically ill with multiple organ systems failure and requires high complexity decision making for assessment and support, frequent evaluation and titration of therapies, application of advanced monitoring technologies and extensive interpretation of multiple databases.   Critical Care Time devoted to patient care services described in this note is  35  Minutes. This time reflects time of care of this signee Dr Jennet Maduro. This critical care time does not reflect procedure time, or teaching time or supervisory time of PA/NP/Med student/Med Resident etc but could involve care discussion time.  Rush Farmer, M.D. [facility] Pulmonary/Critical Care Medicine. Pager: [phone]. After hours pager: [phone].

## 2014-08-14 NOTE — ED Notes (Signed)
Intensivists at bedside.

## 2014-08-14 NOTE — ED Notes (Signed)
Per EMS, pt coming from home due to increased AMS and decreased LOC. A friend reported that he was confused last night. Pts nurse came to follow up with him and then called EMS due to decreased LOC. Pt responded to voice for EMS at his home.   EMS assisted respiration in route and placed NPA in right nair. Pt not responding to painful stimuli at this time.

## 2014-08-14 NOTE — Code Documentation (Signed)
Intubation successfully completed by Dr. Ralene Bathe

## 2014-08-14 NOTE — ED Notes (Signed)
Pt ET tube began to fill with blood again. Tube suctioned. Mouth suctioned with regular suction. Intensivists at bedside during this.

## 2014-08-14 NOTE — ED Notes (Signed)
Pt responding to voice at present, grips weak bilaterally. Pt ET tube noted to fill with blood, tube suctioned and RT called for further evaluation. Pt mouth noted to have several large blood clots come out.

## 2014-08-14 NOTE — Code Documentation (Addendum)
Dr. Ralene Bathe preparing for intubation due to pts unresponsiveness.

## 2014-08-14 NOTE — ED Notes (Signed)
Pt CBG 119. CBG machine not copying into chart.

## 2014-08-14 NOTE — ED Provider Notes (Addendum)
CSN: [id number]     Arrival date & time 08/14/14  1429 History   First MD Initiated Contact with Patient 08/14/14 1437     Chief Complaint  Patient presents with  . Altered Mental Status     Patient is a 59 y.o. male presenting with altered mental status. The history is provided by the EMS personnel. No language interpreter was used.  Altered Mental Status   [name] presents by EMS for evaluation of AMS. Level V caveat due to unresponsiveness. Per EMS report he was noted to be less responsive yesterday. When home health came out to see him today he was minimally responsive. When EMS arrived the patient was talking a little bit but became completely unresponsive and routes with respirations of 4. Nasal tracheal intubation was attempted by EMS without success.  Past Medical History  Diagnosis Date  . Neuropathy   . Diabetes mellitus   . Bipolar affect, depressed   . Hypertension   . Arthritis   . Stroke     Mini stroke about 32yrs ago  . Cirrhosis   . Alcohol abuse   . Chronic pain   . Cocaine abuse   . Muscle spasm     both legs  . Encephalopathy, hepatic   . Detached retina   . COPD (chronic obstructive pulmonary disease)     emphysema  . Bronchitis   . Barrett's esophagus   . GERD (gastroesophageal reflux disease)     has ulcer  . Anemia    Past Surgical History  Procedure Laterality Date  . Fracture surgery      Leg and arm 29yrs ago  . Esophagogastroduodenoscopy  04/04/2012    Procedure: ESOPHAGOGASTRODUODENOSCOPY (EGD);  Surgeon: Irene Shipper, MD;  Location: [facility] ENDOSCOPY;  Service: Endoscopy;  Laterality: N/A;  . Esophagogastroduodenoscopy Left 03/13/2013    Procedure: ESOPHAGOGASTRODUODENOSCOPY (EGD);  Surgeon: Arta Silence, MD;  [facility] END[facility]COPY;  Service: Endoscopy;  Laterality: Left;  Marland Kitchen Eye surgery  8 months ago both eyes    cataracts both eyes, detached eye, gas pocket  . Vasectomy    . Pars plana vitrectomy Left 07/08/2013    Procedure: PARS PLANA  VITRECTOMY WITH 25 GAUGE;  Surgeon: Hurman Horn, MD;  Location: [place];  Service: Ophthalmology;  Laterality: Left;  . Intraocular lens removal Left 07/08/2013    Procedure: REMOVAL OF INTRAOCULAR LENS;  Surgeon: Hurman Horn, MD;  Location: [place];  Service: Ophthalmology;  Laterality: Left;  . Placement and suture of secondary intraocular lens Left 07/08/2013    Procedure: PLACEMENT AND SUTURE OF SECONDARY INTRAOCULAR LENS;  Surgeon: Hurman Horn, MD;  Location: [place];  Service: Ophthalmology;  Laterality: Left;  Insertion of Anterior Capsule Intraocular Lens   . Esophagogastroduodenoscopy N/A 01/16/2014    Procedure: ESOPHAGOGASTRODUODENOSCOPY (EGD);  Surgeon: Winfield Cunas., MD;  Location: [facility] ENDOSCOPY;  Service: Endoscopy;  Laterality: N/A;  . Colonoscopy N/A 01/17/2014    Procedure: COLONOSCOPY;  Surgeon: Winfield Cunas., MD;  Location: [facility] ENDOSCOPY;  Service: Endoscopy;  Laterality: N/A;  possible banding   Family History  Problem Relation Age of Onset  . Hypotension Mother    History  Substance Use Topics  . Smoking status: Current Every Day Smoker -- 1.00 packs/day for 30 years    Types: Cigarettes    Last Attempt to Quit: 04/06/2012  . Smokeless tobacco: Never Used     Comment: quit   . Alcohol Use: 0.0 oz/week  Comment: 12 pk beer daily  06/2013 - no alcohol since 11/2012    Review of Systems  Unable to perform ROS     Allergies  Review of patient's allergies indicates no known allergies.  Home Medications   Prior to Admission medications   Medication Sig Start Date End Date Taking? Authorizing Provider  diazepam (VALIUM) 2 MG tablet Take 1 tablet (2 mg total) by mouth every 8 (eight) hours as needed for anxiety. 07/31/14   Geradine Girt, DO  folic acid (FOLVITE) 1 MG tablet Take 1 tablet (1 mg total) by mouth daily. 07/31/14   Geradine Girt, DO  furosemide (LASIX) 20 MG tablet Take 1 tablet (20 mg total) by mouth 2 (two) times daily. 08/01/14   Geradine Girt, DO  insulin aspart (NOVOLOG FLEXPEN) 100 UNIT/ML FlexPen Inject 6 Units into the skin 3 (three) times daily with meals. For diabetes 02/19/14   Encarnacion Slates, NP  Insulin Glargine (LANTUS SOLOSTAR) 100 UNIT/ML Solostar Pen Inject 38 units into the skin at bedtime: For diabetes 02/19/14   Encarnacion Slates, NP  lactulose (CHRONULAC) 10 GM/15ML solution Take 45 mLs (30 g total) by mouth 3 (three) times daily. 08/01/14   Geradine Girt, DO  Multiple Vitamin (MULTIVITAMIN WITH MINERALS) TABS tablet Take 1 tablet by mouth daily. 07/31/14   Geradine Girt, DO  pantoprazole (PROTONIX) 40 MG tablet Take 40 mg by mouth 2 (two) times daily. 07/09/14   Historical Provider, MD  rifaximin (XIFAXAN) 550 MG TABS tablet Take 1 tablet (550 mg total) by mouth 2 (two) times daily. For IBS 02/19/14   Encarnacion Slates, NP  spironolactone (ALDACTONE) 100 MG tablet Take 1 tablet (100 mg total) by mouth daily. 07/31/14   Geradine Girt, DO  thiamine 100 MG tablet Take 1 tablet (100 mg total) by mouth daily. 07/31/14   Jessica U Vann, DO   BP 140/86 mmHg  Pulse 88  Resp 14  SpO2 100% Physical Exam  Constitutional: He appears well-developed and well-nourished.  HENT:  Head: Normocephalic.  Active bleeding from the right naris  Eyes:  Pupils midsize and reactive bilaterally  Cardiovascular: Normal rate and regular rhythm.   No murmur heard. Pulmonary/Chest:  Poor respiratory effort with decreased air movement bilaterally  Abdominal: Soft. There is no tenderness. There is no rebound and no guarding.  Musculoskeletal: He exhibits no edema or tenderness.  2-3+ pitting edema bilateral lower extremities  Neurological:  GCS 1-1-1  Skin: Skin is warm and dry.  Psychiatric: He has a normal mood and affect. His behavior is normal.  Nursing note and vitals reviewed.   ED Course  Procedures (including critical care time)  CRITICAL CARE Performed by: Quintella Reichert   Total critical care time: 30 minutes  Critical care  time was exclusive of separately billable procedures and treating other patients.  Critical care was necessary to treat or prevent imminent or life-threatening deterioration.  Critical care was time spent personally by me on the following activities: development of treatment plan with patient and/or surrogate as well as nursing, discussions with consultants, evaluation of patient's response to treatment, examination of patient, obtaining history from patient or surrogate, ordering and performing treatments and interventions, ordering and review of laboratory studies, ordering and review of radiographic studies, pulse oximetry and re-evaluation of patient's condition.  INTUBATION Performed by: Quintella Reichert  Required items: required blood products, implants, devices, and special equipment available Patient identity confirmed: provided demographic data and hospital-assigned  identification number Time out: Immediately prior to procedure a "time out" was called to verify the correct patient, procedure, equipment, support staff and site/side marked as required.  Indications: respiratory failure  Intubation method: Glidescope Laryngoscopy   Preoxygenation: BVM  Sedatives: Etomidate Paralytic: Succinylcholine  Tube Size: 8.0 cuffed  Post-procedure assessment: chest rise and ETCO2 monitor Breath sounds: equal and absent over the epigastrium Tube secured with: ETT holder Chest x-ray interpreted by radiologist and me.  Chest x-ray findings: endotracheal tube in appropriate position  Patient tolerated the procedure well with no immediate complications.     Labs Review Labs Reviewed  COMPREHENSIVE METABOLIC PANEL - Abnormal; Notable for the following:    Potassium 5.8 (*)    Creatinine, Ser 1.45 (*)    Calcium 8.3 (*)    Albumin 3.0 (*)    AST 51 (*)    Total Bilirubin 2.6 (*)    GFR calc non Af Amer 52 (*)    GFR calc Af Amer 60 (*)    All other components within normal limits   CBC WITH DIFFERENTIAL/PLATELET - Abnormal; Notable for the following:    RBC 3.26 (*)    Hemoglobin 10.5 (*)    HCT 31.6 (*)    RDW 17.2 (*)    Platelets 55 (*)    Eosinophils Relative 6 (*)    All other components within normal limits  URINALYSIS, ROUTINE W REFLEX MICROSCOPIC - Abnormal; Notable for the following:    Color, Urine AMBER (*)    All other components within normal limits  URINE RAPID DRUG SCREEN (HOSP PERFORMED) - Abnormal; Notable for the following:    Opiates POSITIVE (*)    Benzodiazepines POSITIVE (*)    All other components within normal limits  AMMONIA - Abnormal; Notable for the following:    Ammonia 83 (*)    All other components within normal limits  I-STAT CHEM 8, ED - Abnormal; Notable for the following:    Potassium 5.7 (*)    Calcium, Ion 1.06 (*)    Hemoglobin 11.2 (*)    HCT 33.0 (*)    All other components within normal limits  I-STAT ARTERIAL BLOOD GAS, ED - Abnormal; Notable for the following:    pH, Arterial 7.330 (*)    pCO2 arterial 53.7 (*)    pO2, Arterial 416.0 (*)    Bicarbonate 28.3 (*)    All other components within normal limits  URINE CULTURE  CULTURE, BLOOD (ROUTINE X 2)  CULTURE, BLOOD (ROUTINE X 2)  ETHANOL  BLOOD GAS, ARTERIAL  LACTIC ACID, PLASMA  PROCALCITONIN  AMYLASE  LIPASE, BLOOD  TROPONIN I  TROPONIN I  TROPONIN I  CBC  CBC  BRAIN NATRIURETIC PEPTIDE  BLOOD GAS, ARTERIAL  ACETAMINOPHEN LEVEL  SALICYLATE LEVEL  BASIC METABOLIC PANEL  PROTIME-INR  TRIGLYCERIDES  HEMOGLOBIN A1C  I-STAT CG4 LACTIC ACID, ED  I-STAT TROPOININ, ED    Imaging Review Ct Head Wo Contrast  08/14/2014   CLINICAL DATA:  Altered mental status  EXAM: CT HEAD WITHOUT CONTRAST  TECHNIQUE: Contiguous axial images were obtained from the base of the skull through the vertex without intravenous contrast.  COMPARISON:  07/28/2014  FINDINGS: No skull fracture is noted. There is mucosal thickening with partial opacification bilateral sphenoid  sinus. Mucosal thickening bilateral ethmoid air cells. The mastoid air cells are unremarkable. Abundant secretions are noted in nasopharynx.  No intracranial hemorrhage, mass effect or midline shift.  Stable mild cerebral atrophy. Stable mild periventricular chronic white matter  disease. No definite acute cortical infarction. No mass lesion is noted on this unenhanced scan.  IMPRESSION: No acute intracranial abnormality. Paranasal sinuses as described above. Stable atrophy and chronic white matter disease. No definite acute cortical infarction. Abundant secretions are noted in nasopharynx.   Electronically Signed   By: Lahoma Crocker M.D.   On: 08/14/2014 16:53   Dg Chest Port 1 View  08/14/2014   CLINICAL DATA:  Altered mental status.  EXAM: PORTABLE CHEST - 1 VIEW  COMPARISON:  02/10/2014  FINDINGS: Endotracheal tube with tip just below the clavicular heads. The gastric suction tube reaches the stomach, although the stomach remains moderately gas and fluid distended.  Low lung volumes with interstitial crowding. No suspected pneumonia or edema. No effusion or pneumothorax.  Normal heart size and aortic contours for technique.  IMPRESSION: 1. Endotracheal and orogastric tubes are in good position. 2. Low lung volumes.   Electronically Signed   By: Monte Fantasia M.D.   On: 08/14/2014 15:05     EKG Interpretation   Date/Time:  Thursday August 14 2014 14:53:41 EDT Ventricular Rate:  81 PR Interval:  171 QRS Duration: 90 QT Interval:  383 QTC Calculation: 445 R Axis:   31 Text Interpretation:  Sinus rhythm Confirmed by Hazle Coca [phone]) on  08/14/2014 3:01:33 PM      MDM   Final diagnoses:  Glasgow coma scale total score 3-8  Thrombocytopenia    Patient with history of cirrhosis and alcohol abuse here for altered mental status. Patient with GCS of 3 on ED arrival, blood in his naris from attempted nasotracheal intubation. Patient also with poor respiratory effort. Patient intubated for  respiratory support. Labs demonstrate stable thrombocytopenia. Patient with minimal improvement in mental status following intubation. Consulted with critical care regarding admission for further management.  Quintella Reichert, MD 08/14/14 [place], MD 08/14/14 [phone]

## 2014-08-14 NOTE — ED Provider Notes (Signed)
Dr. Ralene Bathe saw and evaluated patient.  Critical care consulted but patient awaiting bed.  Patient currently on vent and sedated- appears stable.  Awaiting ICU bed.   Pattricia Boss, MD 08/14/14 [phone]

## 2014-08-15 ENCOUNTER — Inpatient Hospital Stay (HOSPITAL_COMMUNITY): Payer: Medicare PPO

## 2014-08-15 DIAGNOSIS — G9340 Encephalopathy, unspecified: Secondary | ICD-10-CM

## 2014-08-15 DIAGNOSIS — I10 Essential (primary) hypertension: Secondary | ICD-10-CM

## 2014-08-15 LAB — CBC
HCT: 29.5 % — ABNORMAL LOW (ref 39.0–52.0)
HEMATOCRIT: 29.7 % — AB (ref 39.0–52.0)
HEMOGLOBIN: 10.1 g/dL — AB (ref 13.0–17.0)
Hemoglobin: 10 g/dL — ABNORMAL LOW (ref 13.0–17.0)
MCH: 32.3 pg (ref 26.0–34.0)
MCH: 32.8 pg (ref 26.0–34.0)
MCHC: 33.7 g/dL (ref 30.0–36.0)
MCHC: 34.2 g/dL (ref 30.0–36.0)
MCV: 95.8 fL (ref 78.0–100.0)
MCV: 95.8 fL (ref 78.0–100.0)
Platelets: 48 10*3/uL — ABNORMAL LOW (ref 150–400)
Platelets: 47 10*3/uL — ABNORMAL LOW (ref 150–400)
RBC: 3.1 MIL/uL — AB (ref 4.22–5.81)
RBC: 3.08 MIL/uL — AB (ref 4.22–5.81)
RDW: 17.1 % — ABNORMAL HIGH (ref 11.5–15.5)
RDW: 17 % — ABNORMAL HIGH (ref 11.5–15.5)
WBC: 3.8 10*3/uL — AB (ref 4.0–10.5)
WBC: 3.6 10*3/uL — AB (ref 4.0–10.5)

## 2014-08-15 LAB — BASIC METABOLIC PANEL
Anion gap: 4 — ABNORMAL LOW (ref 5–15)
BUN: 21 mg/dL (ref 6–23)
CO2: 26 mmol/L (ref 19–32)
Calcium: 8.1 mg/dL — ABNORMAL LOW (ref 8.4–10.5)
Chloride: 108 mmol/L (ref 96–112)
Creatinine, Ser: 1.23 mg/dL (ref 0.50–1.35)
GFR calc Af Amer: 73 mL/min — ABNORMAL LOW (ref >90)
GFR calc non Af Amer: 63 mL/min — ABNORMAL LOW (ref >90)
GLUCOSE: 70 mg/dL (ref 70–99)
POTASSIUM: 4.2 mmol/L (ref 3.5–5.1)
SODIUM: 138 mmol/L (ref 135–145)

## 2014-08-15 LAB — GLUCOSE, CAPILLARY
GLUCOSE-CAPILLARY: 58 mg/dL — AB (ref 70–99)
GLUCOSE-CAPILLARY: 100 mg/dL — AB (ref 70–99)
Glucose-Capillary: 105 mg/dL — ABNORMAL HIGH (ref 70–99)
Glucose-Capillary: 108 mg/dL — ABNORMAL HIGH (ref 70–99)
Glucose-Capillary: 70 mg/dL (ref 70–99)
Glucose-Capillary: 89 mg/dL (ref 70–99)

## 2014-08-15 LAB — TROPONIN I: Troponin I: <0.03 ng/mL (ref <0.031)

## 2014-08-15 MED ORDER — CETYLPYRIDINIUM CHLORIDE 0.05 % MT LIQD
7.0000 mL | Freq: Two times a day (BID) | OROMUCOSAL | Status: DC
  Administered 2014-08-16 – 2014-08-17 (×4): 7 mL via OROMUCOSAL

## 2014-08-15 MED ORDER — CHLORHEXIDINE GLUCONATE 0.12 % MT SOLN
15.0000 mL | Freq: Two times a day (BID) | OROMUCOSAL | Status: DC
  Administered 2014-08-16 – 2014-08-18 (×4): 15 mL via OROMUCOSAL
  Filled 2014-08-15 (×7): qty 15

## 2014-08-15 MED ORDER — SODIUM CHLORIDE 0.9 % IV SOLN
INTRAVENOUS | Status: DC
  Administered 2014-08-15: 11:00:00 via INTRAVENOUS

## 2014-08-15 MED ORDER — DEXTROSE 50 % IV SOLN
1.0000 | Freq: Once | INTRAVENOUS | Status: AC
  Administered 2014-08-15: 50 mL via INTRAVENOUS
  Filled 2014-08-15: qty 50

## 2014-08-15 MED ORDER — DEXTROSE 50 % IV SOLN
INTRAVENOUS | Status: AC
  Filled 2014-08-15: qty 50

## 2014-08-15 MED ORDER — FUROSEMIDE 10 MG/ML IJ SOLN
40.0000 mg | Freq: Once | INTRAMUSCULAR | Status: AC
  Administered 2014-08-15: 40 mg via INTRAVENOUS
  Filled 2014-08-15: qty 4

## 2014-08-15 NOTE — Care Management Note (Signed)
    Page 1 of 1   08/15/2014     7:51:52 AM CARE MANAGEMENT NOTE 08/15/2014  Patient:  [name], [name]   Account Number:  [id number]  Date Initiated:  08/15/2014  Documentation initiated by:  Elissa Hefty  Subjective/Objective Assessment:   adm w encepalopathy-vent     Action/Plan:   lives alone, pcp dr Merrilee Jansky garba   Anticipated DC Date:     Anticipated DC Plan:  [place] referral  Clinical Social Worker         [facility] Choice  Resumption Of Svcs/PTA Provider   Choice offered to / List presented to:          [facility] arranged  HH-1 RN  [place].   Status of service:   Medicare Important Message given?   (If response is "NO", the following Medicare IM given date fields will be blank) Date Medicare IM given:   Medicare IM given by:   Date Additional Medicare IM given:   Additional Medicare IM given by:    Discharge Disposition:    Per UR Regulation:    If discussed at Long Length of Stay Meetings, dates discussed:    Comments:

## 2014-08-15 NOTE — Progress Notes (Signed)
PULMONARY / CRITICAL CARE MEDICINE   Name: [name] MRN: [id number] DOB: May 28, 1955    ADMISSION DATE:  08/14/2014 CONSULTATION DATE:  08/14/2014  REFERRING MD :  EDP Dr. Ralene Bathe  CHIEF COMPLAINT:  AMS  INITIAL PRESENTATION: 59 year old male with PMH of alcoholic cirrhosis presented to [facility] ED after being found down at home. On EMS arrival he reportedly had GCS of 3. He was intubated in ED. PCCM to admit. PMH - DM, Alcoholic Cirrhosis, HTN, CVA, COPD, and cocaine abuse.  STUDIES:  3/24 CT head >> sinusitis  SIGNIFICANT EVENTS: 3/7 - 3/11 Admission to [facility] for AMS, hepatic encephalopathy.   SUBJECTIVE: afebrile Denies pain RASS 0 on propofol gtt  VITAL SIGNS: Temp:  [97.3 F (36.3 C)-98.1 F (36.7 C)] 98.1 F (36.7 C) (03/25 0300) Pulse Rate:  [61-92] 69 (03/25 0700) Resp:  [9-18] 18 (03/25 0700) BP: (79-152)/(42-86) 117/55 mmHg (03/25 0700) SpO2:  [95 %-100 %] 100 % (03/25 0700) FiO2 (%):  [40 %-100 %] 40 % (03/25 0400) Weight:  [97.7 kg (215 lb 6.2 oz)] 97.7 kg (215 lb 6.2 oz) (03/25 0400) HEMODYNAMICS:   VENTILATOR SETTINGS: Vent Mode:  [-] PRVC FiO2 (%):  [40 %-100 %] 40 % Set Rate:  [16 bmp-18 bmp] 18 bmp Vt Set:  [580 mL] 580 mL PEEP:  [5 cmH20] 5 cmH20 Plateau Pressure:  [18 cmH20-20 cmH20] 19 cmH20 INTAKE / OUTPUT:  Intake/Output Summary (Last 24 hours) at 08/15/14 0800 Last data filed at 08/15/14 0600  Gross per 24 hour  Intake 1246.85 ml  Output    720 ml  Net 526.85 ml    PHYSICAL EXAMINATION: General:  Male of normal body habitus in NAD Neuro:  Sedated on vent. RASS 0 HEENT:  [place]/AT, PERRL, no JVD Cardiovascular:  RRR, no MRG Lungs:  Diffusely course Abdomen:  Soft, non-distended Musculoskeletal:  No acute deformity Skin:  +3 pitting edema to BLE.   LABS:  CBC  Recent Labs Lab 08/14/14 1910 08/15/14 0135 08/15/14 0330  WBC 3.1* 3.8* 3.6*  HGB 10.2* 10.0* 10.1*  HCT 30.5* 29.7* 29.5*  PLT 48* 48* 47*   Coag's  Recent Labs Lab  08/14/14 1910  INR 1.53*   BMET  Recent Labs Lab 08/14/14 1445 08/14/14 1503 08/14/14 1910 08/15/14 0330  NA 139 138 137 138  K 5.8* 5.7* 4.9 4.2  CL 105 104 103 108  CO2 28  --  26 26  BUN 19 23 20 21   CREATININE 1.45* 1.30 1.36* 1.23  GLUCOSE 94 96 97 70   Electrolytes  Recent Labs Lab 08/14/14 1445 08/14/14 1910 08/15/14 0330  CALCIUM 8.3* 8.2* 8.1*   Sepsis Markers  Recent Labs Lab 08/14/14 1502 08/14/14 1910  LATICACIDVEN 1.76 1.8  PROCALCITON  --  0.10   ABG  Recent Labs Lab 08/14/14 1544  PHART 7.330*  PCO2ART 53.7*  PO2ART 416.0*   Liver Enzymes  Recent Labs Lab 08/14/14 1445  AST 51*  ALT 27  ALKPHOS 117  BILITOT 2.6*  ALBUMIN 3.0*   Cardiac Enzymes  Recent Labs Lab 08/14/14 1910 08/14/14 2141 08/15/14 0330  TROPONINI <0.03 <0.03 <0.03   Glucose  Recent Labs Lab 08/14/14 1704 08/14/14 2005 08/14/14 2340 08/15/14 0318 08/15/14 0402  GLUCAP 119* 85 71 58* 108*    Imaging Ct Head Wo Contrast  08/14/2014   CLINICAL DATA:  Altered mental status  EXAM: CT HEAD WITHOUT CONTRAST  TECHNIQUE: Contiguous axial images were obtained from the base of the skull  through the vertex without intravenous contrast.  COMPARISON:  07/28/2014  FINDINGS: No skull fracture is noted. There is mucosal thickening with partial opacification bilateral sphenoid sinus. Mucosal thickening bilateral ethmoid air cells. The mastoid air cells are unremarkable. Abundant secretions are noted in nasopharynx.  No intracranial hemorrhage, mass effect or midline shift.  Stable mild cerebral atrophy. Stable mild periventricular chronic white matter disease. No definite acute cortical infarction. No mass lesion is noted on this unenhanced scan.  IMPRESSION: No acute intracranial abnormality. Paranasal sinuses as described above. Stable atrophy and chronic white matter disease. No definite acute cortical infarction. Abundant secretions are noted in nasopharynx.    Electronically Signed   By: Lahoma Crocker M.D.   On: 08/14/2014 16:53   Dg Chest Port 1 View  08/14/2014   CLINICAL DATA:  Altered mental status.  EXAM: PORTABLE CHEST - 1 VIEW  COMPARISON:  02/10/2014  FINDINGS: Endotracheal tube with tip just below the clavicular heads. The gastric suction tube reaches the stomach, although the stomach remains moderately gas and fluid distended.  Low lung volumes with interstitial crowding. No suspected pneumonia or edema. No effusion or pneumothorax.  Normal heart size and aortic contours for technique.  IMPRESSION: 1. Endotracheal and orogastric tubes are in good position. 2. Low lung volumes.   Electronically Signed   By: Monte Fantasia M.D.   On: 08/14/2014 15:05     ASSESSMENT / PLAN:  PULMONARY OETT 3/24 >> A: Acute hypercarbic/hypoxemic respiratory failure due to altered mental status likely from high ammonia. COPD no exacerbation. P:   SBTs with goal extubation VAP bundle   CARDIOVASCULAR A:  H/o HTN ? CHF P:  Telemetry monitoring Echo ordered.  RENAL A:   AKI Hyperkalemia due supplementation. P:   Holding  spironolactone Lasix x1   GASTROINTESTINAL A:   Alcoholic cirrhosis ? GIB, suspect bloody oral secretions from traumatic intubation.   P:   Lactulose. IV PPI  HEMATOLOGIC A:   Anemia of chronic disease ? Acute component from traumatic nasal intubation attempt.   P:  Transfuse per ICU guidelines  INFECTIOUS A:   No acute issues Low PCT reassuring P:   [facility] 3/24 >> Monitor off ABX   ENDOCRINE A:   DM P:   CBG monitoring and SSI Check cortisol  NEUROLOGIC A:   Acute metabolic encephalopathy - ammonia 83 EtOH abuse (reportedly clean for 3 years) H/o substance abuse (opiates benzo + on UDS) P:   RASS goal: 0 to 1 Lactulose Rifaximin Dc Propofol gtt , resume low dose benzo (home diazepam) PRN fentanyl for sedation Hold amitriptyline  Summary - Hepatic encephalopathy - hope to extubate today  FAMILY   - Updates: No family at bedside  - Inter-disciplinary family meet or Palliative Care meeting due by: 3/31   The patient is critically ill with multiple organ systems failure and requires high complexity decision making for assessment and support, frequent evaluation and titration of therapies, application of advanced monitoring technologies and extensive interpretation of multiple databases. Critical Care Time devoted to patient care services described in this note independent of APP time is 35 minutes.    Kara Mead MD. [place] Flood. [facility] Pager [phone] If no response call [phone]

## 2014-08-15 NOTE — Progress Notes (Signed)
  Echocardiogram 2D Echocardiogram has been performed.  Darlina Sicilian M 08/15/2014, 2:34 PM

## 2014-08-15 NOTE — Procedures (Signed)
Extubation Procedure Note  Patient Details:   Name: [name] DOB: 1955-10-04 MRN: [id number]   Airway Documentation:     Evaluation  O2 sats: stable throughout Complications: No apparent complications Patient did tolerate procedure well. Bilateral Breath Sounds: Rhonchi Suctioning: Airway Yes   Patient extubated to 2L nasal cannula.  Positive cuff leak noted.  No evidence of stridor.  Patient able to cough post extubation and speak post extubation.  Attempted to perform incentive spirometry, however patient still lethargic.  Sats currently 96%.  Vitals are stable.  No apparent complications.  Alphia Moh N 08/15/2014, 3:10 PM

## 2014-08-15 NOTE — Progress Notes (Signed)
[facility]  Patient Status: Active (receiving services up to time of hospitalization)  [facility] is providing the following services: RN  If patient discharges after hours, please call [phone].   Janae Sauce 08/15/2014, 4:30 PM

## 2014-08-16 DIAGNOSIS — K7290 Hepatic failure, unspecified without coma: Principal | ICD-10-CM

## 2014-08-16 DIAGNOSIS — R0902 Hypoxemia: Secondary | ICD-10-CM

## 2014-08-16 DIAGNOSIS — R601 Generalized edema: Secondary | ICD-10-CM

## 2014-08-16 LAB — CBC
HCT: 26.9 % — ABNORMAL LOW (ref 39.0–52.0)
Hemoglobin: 9.1 g/dL — ABNORMAL LOW (ref 13.0–17.0)
MCH: 32.5 pg (ref 26.0–34.0)
MCHC: 33.8 g/dL (ref 30.0–36.0)
MCV: 96.1 fL (ref 78.0–100.0)
PLATELETS: 44 10*3/uL — AB (ref 150–400)
RBC: 2.8 MIL/uL — ABNORMAL LOW (ref 4.22–5.81)
RDW: 17.2 % — ABNORMAL HIGH (ref 11.5–15.5)
WBC: 3.9 10*3/uL — AB (ref 4.0–10.5)

## 2014-08-16 LAB — GLUCOSE, CAPILLARY
Glucose-Capillary: 92 mg/dL (ref 70–99)
Glucose-Capillary: 106 mg/dL — ABNORMAL HIGH (ref 70–99)
Glucose-Capillary: 98 mg/dL (ref 70–99)
Glucose-Capillary: 120 mg/dL — ABNORMAL HIGH (ref 70–99)
Glucose-Capillary: 139 mg/dL — ABNORMAL HIGH (ref 70–99)
Glucose-Capillary: 91 mg/dL (ref 70–99)
Glucose-Capillary: 108 mg/dL — ABNORMAL HIGH (ref 70–99)

## 2014-08-16 LAB — BASIC METABOLIC PANEL
ANION GAP: 7 (ref 5–15)
BUN: 20 mg/dL (ref 6–23)
CALCIUM: 7.9 mg/dL — AB (ref 8.4–10.5)
CHLORIDE: 108 mmol/L (ref 96–112)
CO2: 25 mmol/L (ref 19–32)
Creatinine, Ser: 1.17 mg/dL (ref 0.50–1.35)
GFR calc Af Amer: 78 mL/min — ABNORMAL LOW (ref >90)
GFR calc non Af Amer: 67 mL/min — ABNORMAL LOW (ref >90)
GLUCOSE: 111 mg/dL — AB (ref 70–99)
Potassium: 4.3 mmol/L (ref 3.5–5.1)
Sodium: 140 mmol/L (ref 135–145)

## 2014-08-16 LAB — HEMOGLOBIN A1C
Hgb A1c MFr Bld: 5.9 % — ABNORMAL HIGH (ref 4.8–5.6)
Mean Plasma Glucose: 123 mg/dL

## 2014-08-16 LAB — URINE CULTURE: Colony Count: 80000

## 2014-08-16 MED ORDER — IBUPROFEN 200 MG PO TABS
400.0000 mg | ORAL_TABLET | Freq: Four times a day (QID) | ORAL | Status: DC | PRN
  Administered 2014-08-17: 400 mg via ORAL
  Filled 2014-08-16: qty 2

## 2014-08-16 MED ORDER — FUROSEMIDE 10 MG/ML IJ SOLN
20.0000 mg | Freq: Four times a day (QID) | INTRAMUSCULAR | Status: AC
  Administered 2014-08-16 (×2): 20 mg via INTRAVENOUS
  Filled 2014-08-16 (×2): qty 2

## 2014-08-16 NOTE — Evaluation (Signed)
Physical Therapy Evaluation Patient Details Name: [name] MRN: [id number] DOB: 09-Jun-1955 Today's Date: 08/16/2014   History of Present Illness  Patient is a 59 yo male admitted 08/14/14 with AMS, hepatic encephalopathy.  PMH:  alcoholic cirrhosis, DM, HTN, CVA, COPD, polysubstance abuse, bipolar disorder, depression  Clinical Impression  Patient presents with problems listed below.  Will benefit from acute PT to maximize independence prior to discharge. Patient lives alone.  Requires assist for all mobility.  Recommend SNF at discharge for continued therapy.    Follow Up Recommendations SNF;Supervision/Assistance - 24 hour    Equipment Recommendations  None recommended by PT    Recommendations for Other Services       Precautions / Restrictions Precautions Precautions: Fall Precaution Comments: Patient reports "I fall alot" Restrictions Weight Bearing Restrictions: No      Mobility  Bed Mobility Overal bed mobility: Needs Assistance Bed Mobility: Supine to Sit     Supine to sit: Mod assist     General bed mobility comments: Verbal cues for technique.  Assist to bring LE's off of bed, and to raise trunk to sitting.  Assist to scoot forward to EOB.  Once upright, patient able to maintain sitting balance.  Transfers Overall transfer level: Needs assistance Equipment used: 2 person hand held assist Transfers: Sit to/from [facility] Sit to Stand: Min assist;+2 physical assistance Stand pivot transfers: Min assist;+2 physical assistance       General transfer comment: Verbal cues for hand placement and technique.  Assist to power up to standing, and for balance for static standing.  Patient able to take several shuffle steps to pivot to chair.  Required cuing to step backward toward chair, and to wait until in correct place before sitting - started sitting too early (too far from chair).  Ambulation/Gait                Stairs             Wheelchair Mobility    Modified Rankin (Stroke Patients Only)       Balance Overall balance assessment: Needs assistance         Standing balance support: Bilateral upper extremity supported Standing balance-Leahy Scale: Poor                               Pertinent Vitals/Pain Pain Assessment: Faces Faces Pain Scale: Hurts even more Pain Location: shoulders, "all over" Pain Descriptors / Indicators: Aching;Sore Pain Intervention(s): Monitored during session;Repositioned    Home Living Family/patient expects to be discharged to:: Skilled nursing facility Living Arrangements: Alone             Home Equipment: Gilford Rile - 2 wheels;Bedside commode Additional Comments: Patient reports there is no one to assist him.    Prior Function Level of Independence: Independent with assistive device(s);Needs assistance   Gait / Transfers Assistance Needed: Uses RW  ADL's / Homemaking Assistance Needed: Per chart, patient had aide to assist with ADL's        Hand Dominance   Dominant Hand: Right    Extremity/Trunk Assessment   Upper Extremity Assessment: Generalized weakness           Lower Extremity Assessment: Generalized weakness (Edema BLE's)         Communication   Communication: No difficulties  Cognition Arousal/Alertness: Awake/alert Behavior During Therapy: Anxious (Tearful) Overall Cognitive Status: No family/caregiver present to determine baseline cognitive functioning Area of  Impairment: Orientation;Attention;Memory;Safety/judgement;Awareness;Problem solving Orientation Level: Disoriented to;Time;Situation Current Attention Level: Sustained Memory: Decreased short-term memory   Safety/Judgement: Decreased awareness of safety   Problem Solving: Slow processing;Decreased initiation;Difficulty sequencing;Requires verbal cues      General Comments      Exercises        Assessment/Plan    PT Assessment Patient needs  continued PT services  PT Diagnosis Difficulty walking;Generalized weakness;Acute pain;Altered mental status   PT Problem List Decreased strength;Decreased activity tolerance;Decreased balance;Decreased mobility;Decreased cognition;Decreased knowledge of use of DME;Decreased safety awareness;Pain  PT Treatment Interventions DME instruction;Gait training;Functional mobility training;Therapeutic activities;Therapeutic exercise;Cognitive remediation;Patient/family education   PT Goals (Current goals can be found in the Care Plan section) Acute Rehab PT Goals Patient Stated Goal: None stated PT Goal Formulation: With patient Time For Goal Achievement: 08/30/14 Potential to Achieve Goals: Good    Frequency Min 3X/week   Barriers to discharge Decreased caregiver support Patient lives alone - has no assist available.    Co-evaluation               End of Session Equipment Utilized During Treatment: Gait belt Activity Tolerance: Patient limited by pain;Patient limited by fatigue Patient left: in chair;with call bell/phone within reach Nurse Communication: Mobility status (request for cough drop and to call wife)         Time: 1047-1100 PT Time Calculation (min) (ACUTE ONLY): 13 min   Charges:   PT Evaluation $Initial PT Evaluation Tier I: 1 Procedure     PT G CodesDespina Pole 2014-08-30, 1:04 PM Carita Pian. Sanjuana Kava, [place] Pager [phone]

## 2014-08-16 NOTE — Progress Notes (Signed)
eLink Physician-Brief Progress Note Patient Name: [name] DOB: [birth year] MRN: [id number]   Date of Service  08/16/2014  HPI/Events of Note  Patient c/o shoulder pain related to old injury. Creatinine = 1.17.  eICU Interventions  Will order Motrin 400 mg Q 6 hours PRN mild to moderate pain. Follow Creatinine closely.     Intervention Category Minor Interventions: Routine modifications to care plan (e.g. PRN medications for pain, fever)  Sommer,Steven Eugene 08/16/2014, 8:56 PM

## 2014-08-16 NOTE — Progress Notes (Signed)
PULMONARY / CRITICAL CARE MEDICINE   Name: [name] MRN: [id number] DOB: 1956-04-26    ADMISSION DATE:  08/14/2014 CONSULTATION DATE:  08/14/2014  REFERRING MD :  EDP Dr. Ralene Bathe  CHIEF COMPLAINT:  AMS  INITIAL PRESENTATION: 59 year old male with PMH of alcoholic cirrhosis presented to [facility] ED after being found down at home. On EMS arrival he reportedly had GCS of 3. He was intubated in ED. PCCM to admit. PMH - DM, Alcoholic Cirrhosis, HTN, CVA, COPD, and cocaine abuse.  STUDIES:  3/24 CT head >> sinusitis  SIGNIFICANT EVENTS: 3/7 - 3/11 Admission to [facility] for AMS, hepatic encephalopathy.  SUBJECTIVE: Alert, interactive, eating  VITAL SIGNS: Temp:  [97.7 F (36.5 C)-99.7 F (37.6 C)] 98.7 F (37.1 C) (03/26 0800) Pulse Rate:  [80-102] 83 (03/26 0900) Resp:  [11-24] 11 (03/26 0900) BP: (96-145)/(40-88) 125/74 mmHg (03/26 0900) SpO2:  [91 %-100 %] 100 % (03/26 0900) FiO2 (%):  [40 %] 40 % (03/25 1155) Weight:  [97 kg (213 lb 13.5 oz)] 97 kg (213 lb 13.5 oz) (03/26 0500) HEMODYNAMICS:   VENTILATOR SETTINGS: Vent Mode:  [-] PSV;CPAP FiO2 (%):  [40 %] 40 % PEEP:  [5 cmH20] 5 cmH20 Pressure Support:  [5 cmH20] 5 cmH20 INTAKE / OUTPUT:  Intake/Output Summary (Last 24 hours) at 08/16/14 1003 Last data filed at 08/16/14 1000  Gross per 24 hour  Intake   1480 ml  Output   1800 ml  Net   -320 ml   PHYSICAL EXAMINATION: General:  Male of normal body habitus in NAD Neuro:  Alert and interactive, moving all ext to commands. HEENT:  [place]/AT, no JVD and MMM Eyes: PERRL, EOM-I. Cardiovascular:  RRR, no MRG Lungs:  Bibasilar crackles.  Abdomen:  Soft, non-distended, ND and +BS. Musculoskeletal:  No acute deformity Skin:  +3 pitting edema to BLE.   LABS:  CBC  Recent Labs Lab 08/15/14 0135 08/15/14 0330 08/16/14 0223  WBC 3.8* 3.6* 3.9*  HGB 10.0* 10.1* 9.1*  HCT 29.7* 29.5* 26.9*  PLT 48* 47* 44*   Coag's  Recent Labs Lab 08/14/14 1910  INR 1.53*    BMET  Recent Labs Lab 08/14/14 1910 08/15/14 0330 08/16/14 0223  NA 137 138 140  K 4.9 4.2 4.3  CL 103 108 108  CO2 26 26 25   BUN 20 21 20   CREATININE 1.36* 1.23 1.17  GLUCOSE 97 70 111*   Electrolytes  Recent Labs Lab 08/14/14 1910 08/15/14 0330 08/16/14 0223  CALCIUM 8.2* 8.1* 7.9*   Sepsis Markers  Recent Labs Lab 08/14/14 1502 08/14/14 1910  LATICACIDVEN 1.76 1.8  PROCALCITON  --  0.10   ABG  Recent Labs Lab 08/14/14 1544  PHART 7.330*  PCO2ART 53.7*  PO2ART 416.0*   Liver Enzymes  Recent Labs Lab 08/14/14 1445  AST 51*  ALT 27  ALKPHOS 117  BILITOT 2.6*  ALBUMIN 3.0*   Cardiac Enzymes  Recent Labs Lab 08/14/14 1910 08/14/14 2141 08/15/14 0330  TROPONINI <0.03 <0.03 <0.03   Glucose  Recent Labs Lab 08/15/14 0810 08/15/14 1141 08/15/14 1712 08/15/14 2330 08/16/14 0413 08/16/14 0750  GLUCAP 70 89 100* 105* 106* 98   Imaging Dg Chest Port 1 View  08/15/2014   CLINICAL DATA:  Respiratory failure.  EXAM: PORTABLE CHEST - 1 VIEW  COMPARISON:  08/14/2014.  FINDINGS: Endotracheal tube and NG tube in good anatomic position. Heart size normal. Pulmonary vascularity normal. Mild basilar atelectasis. No pleural effusion or pneumothorax. No acute osseous  abnormality .  IMPRESSION: 1. Lines and tubes in stable position. 2. Low lung volumes with mild basilar atelectasis.   Electronically Signed   By: Marcello Moores  Register   On: 08/15/2014 07:30   ASSESSMENT / PLAN:  PULMONARY OETT 3/24 >>3/26 A: Acute hypercarbic/hypoxemic respiratory failure due to altered mental status likely from high ammonia. COPD no exacerbation. Remains hypoxemic significantly P:   Titrate O2 for sat of 88-92%. Mobilize. IS per RT protocol.  CARDIOVASCULAR A:  H/o HTN CHF questionable, I suspect the diffuse edema (unfortunately not terribly improving here) is due to hepatic failure. P:  Telemetry monitoring Echo EF 60-65% with grade 1 diastolic  dysfunction. Gentle diureses.  RENAL A:   AKI Worsening diffuse anasarca Hyperkalemia due supplementation improving but remains borderline. P:   Holding  spironolactone Lasix 20 mg IV q6 x3 doses to address volume and potassium. BMET in AM.  GASTROINTESTINAL A:   Alcoholic cirrhosis ? GIB, suspect bloody oral secretions from traumatic intubation.  Now stable. P:   Lactulose. IV PPI  HEMATOLOGIC A:   Anemia of chronic disease ? Acute component from traumatic nasal intubation attempt.   P:  Transfuse per ICU guidelines  INFECTIOUS A:   No acute issues Low PCT reassuring P:   [facility] 3/24 >> D/C abx except for rifaximin (for hepatic encephalopathy)  ENDOCRINE A:   DM P:   CBG monitoring and SSI  NEUROLOGIC A:   Acute metabolic encephalopathy - ammonia 83 EtOH abuse (reportedly clean for 3 years) H/o substance abuse (opiates benzo + on UDS) P:   D/C all sedation. Lactulose Rifaximin Home diazepam as ordered. D/C fentanyl Hold amitriptyline  Summary - Hepatic encephalopathy improved, will mobilize, PT evaluation, titrate O2 down.  I reviewed the CXR personally, remains with pleural effusion and edema.  Discussed with [facility] hospitalist, will transfer to SDU and to [facility], PCCM will be available as needed.  FAMILY  - Updates: No family at bedside  Rush Farmer, M.D. [facility] Pulmonary/Critical Care Medicine. Pager: [phone]. After hours pager: [phone].

## 2014-08-17 ENCOUNTER — Inpatient Hospital Stay (HOSPITAL_COMMUNITY): Payer: Medicare PPO

## 2014-08-17 DIAGNOSIS — K7030 Alcoholic cirrhosis of liver without ascites: Secondary | ICD-10-CM

## 2014-08-17 DIAGNOSIS — D696 Thrombocytopenia, unspecified: Secondary | ICD-10-CM

## 2014-08-17 DIAGNOSIS — E119 Type 2 diabetes mellitus without complications: Secondary | ICD-10-CM

## 2014-08-17 LAB — CBC
HEMATOCRIT: 26.4 % — AB (ref 39.0–52.0)
Hemoglobin: 9 g/dL — ABNORMAL LOW (ref 13.0–17.0)
MCH: 32.1 pg (ref 26.0–34.0)
MCHC: 34.1 g/dL (ref 30.0–36.0)
MCV: 94.3 fL (ref 78.0–100.0)
PLATELETS: 45 10*3/uL — AB (ref 150–400)
RBC: 2.8 MIL/uL — ABNORMAL LOW (ref 4.22–5.81)
RDW: 16.7 % — AB (ref 11.5–15.5)
WBC: 3.1 10*3/uL — ABNORMAL LOW (ref 4.0–10.5)

## 2014-08-17 LAB — BASIC METABOLIC PANEL
Anion gap: 5 (ref 5–15)
BUN: 16 mg/dL (ref 6–23)
CHLORIDE: 104 mmol/L (ref 96–112)
CO2: 28 mmol/L (ref 19–32)
CREATININE: 0.92 mg/dL (ref 0.50–1.35)
Calcium: 8 mg/dL — ABNORMAL LOW (ref 8.4–10.5)
GFR calc Af Amer: >90 mL/min (ref >90)
GLUCOSE: 114 mg/dL — AB (ref 70–99)
Potassium: 3.7 mmol/L (ref 3.5–5.1)
Sodium: 137 mmol/L (ref 135–145)

## 2014-08-17 LAB — GLUCOSE, CAPILLARY
GLUCOSE-CAPILLARY: 113 mg/dL — AB (ref 70–99)
GLUCOSE-CAPILLARY: 170 mg/dL — AB (ref 70–99)
GLUCOSE-CAPILLARY: 146 mg/dL — AB (ref 70–99)
Glucose-Capillary: 110 mg/dL — ABNORMAL HIGH (ref 70–99)
Glucose-Capillary: 150 mg/dL — ABNORMAL HIGH (ref 70–99)

## 2014-08-17 LAB — PHOSPHORUS: Phosphorus: 2.9 mg/dL (ref 2.3–4.6)

## 2014-08-17 LAB — MAGNESIUM: Magnesium: 1.5 mg/dL (ref 1.5–2.5)

## 2014-08-17 MED ORDER — OXYCODONE HCL 5 MG PO TABS
5.0000 mg | ORAL_TABLET | Freq: Four times a day (QID) | ORAL | Status: DC | PRN
  Administered 2014-08-17: 5 mg via ORAL
  Filled 2014-08-17: qty 1

## 2014-08-17 MED ORDER — INSULIN ASPART 100 UNIT/ML ~~LOC~~ SOLN
0.0000 [IU] | Freq: Three times a day (TID) | SUBCUTANEOUS | Status: DC
  Administered 2014-08-17: 2 [IU] via SUBCUTANEOUS
  Administered 2014-08-17: 3 [IU] via SUBCUTANEOUS
  Administered 2014-08-18: 2 [IU] via SUBCUTANEOUS

## 2014-08-17 MED ORDER — ADULT MULTIVITAMIN W/MINERALS CH
1.0000 | ORAL_TABLET | Freq: Every day | ORAL | Status: DC
  Administered 2014-08-17 – 2014-08-18 (×2): 1 via ORAL
  Filled 2014-08-17 (×2): qty 1

## 2014-08-17 MED ORDER — POTASSIUM CHLORIDE CRYS ER 20 MEQ PO TBCR
40.0000 meq | EXTENDED_RELEASE_TABLET | Freq: Once | ORAL | Status: AC
  Administered 2014-08-17: 40 meq via ORAL
  Filled 2014-08-17: qty 2

## 2014-08-17 MED ORDER — FOLIC ACID 1 MG PO TABS
1.0000 mg | ORAL_TABLET | Freq: Every day | ORAL | Status: DC
  Administered 2014-08-17 – 2014-08-18 (×2): 1 mg via ORAL
  Filled 2014-08-17 (×2): qty 1

## 2014-08-17 MED ORDER — RIFAXIMIN 550 MG PO TABS
550.0000 mg | ORAL_TABLET | Freq: Two times a day (BID) | ORAL | Status: DC
  Administered 2014-08-17 – 2014-08-18 (×3): 550 mg via ORAL
  Filled 2014-08-17 (×4): qty 1

## 2014-08-17 MED ORDER — VITAMIN B-1 100 MG PO TABS
100.0000 mg | ORAL_TABLET | Freq: Every day | ORAL | Status: DC
  Administered 2014-08-17 – 2014-08-18 (×2): 100 mg via ORAL
  Filled 2014-08-17 (×2): qty 1

## 2014-08-17 MED ORDER — INSULIN ASPART 100 UNIT/ML ~~LOC~~ SOLN: 0.0000 [IU] | Freq: Every day | SUBCUTANEOUS | Status: DC

## 2014-08-17 MED ORDER — LACTULOSE 10 GM/15ML PO SOLN
30.0000 g | Freq: Three times a day (TID) | ORAL | Status: DC
  Administered 2014-08-17 – 2014-08-18 (×4): 30 g via ORAL
  Filled 2014-08-17 (×6): qty 45

## 2014-08-17 MED ORDER — MAGNESIUM SULFATE 2 GM/50ML IV SOLN
2.0000 g | Freq: Once | INTRAVENOUS | Status: AC
  Administered 2014-08-17: 2 g via INTRAVENOUS
  Filled 2014-08-17: qty 50

## 2014-08-17 MED ORDER — FUROSEMIDE 20 MG PO TABS
20.0000 mg | ORAL_TABLET | Freq: Two times a day (BID) | ORAL | Status: DC
  Administered 2014-08-17 – 2014-08-18 (×3): 20 mg via ORAL
  Filled 2014-08-17 (×5): qty 1

## 2014-08-17 MED ORDER — PANTOPRAZOLE SODIUM 40 MG PO TBEC
40.0000 mg | DELAYED_RELEASE_TABLET | Freq: Two times a day (BID) | ORAL | Status: DC
  Administered 2014-08-17 – 2014-08-18 (×3): 40 mg via ORAL
  Filled 2014-08-17 (×3): qty 1

## 2014-08-17 NOTE — Progress Notes (Signed)
Patient c/o severe right shoulder pain and limited movement in that arm. Dr. Curly Rim notified - new orders received. Will monitor.  Joellen Jersey, RN.

## 2014-08-17 NOTE — Progress Notes (Signed)
[facility] PROGRESS NOTE  [name] YBO:[id number] DOB: Sep 30, 1955 DOA: 08/14/2014  PCP: Barbette Merino, MD  Brief HPI: 59 year old male with PMH of alcoholic cirrhosis presented to [facility] ED after being found down at home. On EMS arrival he reportedly had GCS of 3. He was intubated in ED. He was admitted to the intensive care unit. He was noted to have elevated ammonia. He was started back on his home medications. His mental status started improving. Patient was subsequently extubated. He was then transferred to the hospitalist service.   Past medical history:  Past Medical History  Diagnosis Date  . Neuropathy   . Diabetes mellitus   . Bipolar affect, depressed   . Hypertension   . Arthritis   . Stroke     Mini stroke about 18yrs ago  . Cirrhosis   . Alcohol abuse   . Chronic pain   . Cocaine abuse   . Muscle spasm     both legs  . Encephalopathy, hepatic   . Detached retina   . COPD (chronic obstructive pulmonary disease)     emphysema  . Bronchitis   . Barrett's esophagus   . GERD (gastroesophageal reflux disease)     has ulcer  . Anemia     Consultants: PCCM  Procedures: Intubation  2-D echocardiogram Study Conclusions - Left ventricle: The cavity size was moderately dilated. Systolicfunction was normal. The estimated ejection fraction was in therange of 60% to 65%. Doppler parameters are consistent withabnormal left ventricular relaxation (grade 1 diastolicdysfunction). - Aortic valve: Valve area (VTI): 1.83 cm^2. Valve area (Vmax):1.76 cm^2. Valve area (Vmean): 1.73 cm^2. - Left atrium: The atrium was mildly dilated.  Antibiotics: None  Subjective: Patient feels well this morning. Denies any complaints. No pain. No difficulty breathing.  Objective: Vital Signs  Filed Vitals:   08/17/14 0600 08/17/14 0744 08/17/14 0800 08/17/14 1111  BP: 120/52  111/68 152/74  Pulse: 71  84 89  Temp:  98.2 F (36.8 C)  98.1 F (36.7 C)  TempSrc:  Oral  Oral   Resp: 18  12 18   Height:      Weight:      SpO2: 96%  95% 99%    Intake/Output Summary (Last 24 hours) at 08/17/14 1114 Last data filed at 08/17/14 0800  Gross per 24 hour  Intake   1070 ml  Output   1950 ml  Net   -880 ml   Filed Weights   08/15/14 0400 08/16/14 0500 08/17/14 0300  Weight: 97.7 kg (215 lb 6.2 oz) 97 kg (213 lb 13.5 oz) 95.1 kg (209 lb 10.5 oz)    General appearance: alert, cooperative, appears stated age and no distress Head: Normocephalic, without obvious abnormality, atraumatic Throat: lips, mucosa, and tongue normal; teeth and gums normal Neck: no adenopathy, no carotid bruit, no JVD, supple, symmetrical, trachea midline and thyroid not enlarged, symmetric, no tenderness/mass/nodules Resp: Decreased air entry at the bases. No crackles. No wheezing. Cardio: regular rate and rhythm, S1, S2 normal, no murmur, click, rub or gallop GI: soft, non-tender; bowel sounds normal; no masses,  no organomegaly Extremities: extremities normal, atraumatic, no cyanosis or edema Neurologic: He is awake, alert. Oriented to place, month, year. Cranial nerves II-12 intact. No focal neurological deficits otherwise.  Lab Results:  Basic Metabolic Panel:  Recent Labs Lab 08/14/14 1445 08/14/14 1503 08/14/14 1910 08/15/14 0330 08/16/14 0223 08/17/14 0303  NA 139 138 137 138 140 137  K 5.8* 5.7* 4.9 4.2 4.3  3.7  CL 105 104 103 108 108 104  CO2 28  --  26 26 25 28   GLUCOSE 94 96 97 70 111* 114*  BUN 19 23 20 21 20 16   CREATININE 1.45* 1.30 1.36* 1.23 1.17 0.92  CALCIUM 8.3*  --  8.2* 8.1* 7.9* 8.0*  MG  --   --   --   --   --  1.5  PHOS  --   --   --   --   --  2.9   Liver Function Tests:  Recent Labs Lab 08/14/14 1445  AST 51*  ALT 27  ALKPHOS 117  BILITOT 2.6*  PROT 6.6  ALBUMIN 3.0*    Recent Labs Lab 08/14/14 1910  LIPASE 27  AMYLASE 31    Recent Labs Lab 08/14/14 1457  AMMONIA 83*   CBC:  Recent Labs Lab 08/14/14 1445  08/14/14 1910  08/15/14 0135 08/15/14 0330 08/16/14 0223 08/17/14 0303  WBC 4.4  --  3.1* 3.8* 3.6* 3.9* 3.1*  NEUTROABS 2.8  --   --   --   --   --   --   HGB 10.5*  < > 10.2* 10.0* 10.1* 9.1* 9.0*  HCT 31.6*  < > 30.5* 29.7* 29.5* 26.9* 26.4*  MCV 96.9  --  96.8 95.8 95.8 96.1 94.3  PLT 55*  --  48* 48* 47* 44* 45*  < > = values in this interval not displayed. Cardiac Enzymes:  Recent Labs Lab 08/14/14 1910 08/14/14 2141 08/15/14 0330  TROPONINI <0.03 <0.03 <0.03   BNP (last 3 results)  Recent Labs  08/14/14 1910  BNP 29.3    CBG:  Recent Labs Lab 08/16/14 1633 08/16/14 1934 08/16/14 2310 08/17/14 0306 08/17/14 0747  GLUCAP 139* 91 108* 110* 113*    Recent Results (from the past 240 hour(s))  Urine culture     Status: None   Collection Time: 08/14/14  2:59 PM  Result Value Ref Range Status   Specimen Description URINE, CATHETERIZED  Final   Special Requests NONE  Final   Colony Count   Final    80,000 COLONIES/ML Performed at [facility]    Culture   Final    ENTEROCOCCUS SPECIES Performed at [facility]    Report Status 08/16/2014 FINAL  Final   Organism ID, Bacteria ENTEROCOCCUS SPECIES  Final      Susceptibility   Enterococcus species - MIC*    AMPICILLIN <=2 SENSITIVE Sensitive     LEVOFLOXACIN 1 SENSITIVE Sensitive     NITROFURANTOIN <=16 SENSITIVE Sensitive     VANCOMYCIN 1 SENSITIVE Sensitive     TETRACYCLINE >=16 RESISTANT Resistant     * ENTEROCOCCUS SPECIES  MRSA PCR Screening     Status: None   Collection Time: 08/14/14  6:31 PM  Result Value Ref Range Status   MRSA by PCR NEGATIVE NEGATIVE Final    Comment:        The GeneXpert MRSA Assay (FDA approved for NASAL specimens only), is one component of a comprehensive MRSA colonization surveillance program. It is not intended to diagnose MRSA infection nor to guide or monitor treatment for MRSA infections.   Culture, blood (routine x 2)     Status: None (Preliminary result)     Collection Time: 08/14/14  7:10 PM  Result Value Ref Range Status   Specimen Description BLOOD RIGHT ANTECUBITAL  Final   Special Requests BOTTLES DRAWN AEROBIC AND ANAEROBIC 5CC  Final   Culture  Final           BLOOD CULTURE RECEIVED NO GROWTH TO DATE CULTURE WILL BE HELD FOR 5 DAYS BEFORE ISSUING A FINAL NEGATIVE REPORT Performed at [facility]    Report Status PENDING  Incomplete  Culture, blood (routine x 2)     Status: None (Preliminary result)   Collection Time: 08/14/14  7:15 PM  Result Value Ref Range Status   Specimen Description BLOOD RIGHT HAND  Final   Special Requests BOTTLES DRAWN AEROBIC AND ANAEROBIC 5CC  Final   Culture   Final           BLOOD CULTURE RECEIVED NO GROWTH TO DATE CULTURE WILL BE HELD FOR 5 DAYS BEFORE ISSUING A FINAL NEGATIVE REPORT Performed at [facility]    Report Status PENDING  Incomplete      Studies/Results: No results found.  Medications:  Scheduled: . antiseptic oral rinse  7 mL Mouth Rinse q12n4p  . chlorhexidine  15 mL Mouth Rinse BID  . folic acid  1 mg Oral Daily  . furosemide  20 mg Oral BID  . insulin aspart  0-15 Units Subcutaneous TID WC  . insulin aspart  0-5 Units Subcutaneous QHS  . lactulose  30 g Oral TID  . multivitamin with minerals  1 tablet Oral Daily  . pantoprazole  40 mg Oral BID  . rifaximin  550 mg Oral BID  . thiamine  100 mg Oral Daily   Continuous: . sodium chloride 10 mL/hr at 08/15/14 1900   JOI:TGPQDIYME, ibuprofen, ondansetron (ZOFRAN) IV  Assessment/Plan:  Active Problems:   Thrombocytopenia   Cirrhosis   Hepatic encephalopathy   Altered mental status   Hx of bipolar disorder    Acute respiratory failure requiring intubation This was secondary to altered mental status from hepatic encephalopathy. Patient was extubated. He remains stable. Respiratory failure appears to have resolved.  Hepatic encephalopathy Compliance appears to be an issue. Patient has been started  back on his usual home medication regimen including lactulose and Xifaxan. We'll repeat ammonia level tomorrow morning. Mental status appears to be close to baseline.  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 Continue Lasix.  Diabetes mellitus type 2. Patient is on Lantus at home. Continue monitoring blood sugars. Currently only on sliding scale. Reinitiate Lantus as his blood sugar starts climbing. Monitor oral intake. HbA1c 5.9.  Enterococcus in urine, less than 100,000 colonies His UA was actually did not suggest infection. The patient does not have any symptoms. He is afebrile. At this time there is no indication to treat.  Normocytic anemia. Hemoglobin is stable.  Thrombocytopenia Secondary to liver cirrhosis. Counts are stable. No bleeding. Continue to monitor.  History of alcohol abuse. 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 Continue thiamine.  DVT Prophylaxis: SCDs.    Code Status: Full code  Family Communication: No family at bedside  Disposition Plan: PT recommends SNF. Patient appears to be reluctant to consider this. Will have social worker see patient. He can be transferred to the floor.    LOS: 3 days   [facility] Pager [phone] 08/17/2014, 11:14 AM  If 7PM-7AM, please contact night-coverage at [email or web address], password [facility]

## 2014-08-17 NOTE — Progress Notes (Signed)
Admission note:   Arrival Method: Transfer from [facility]. Mental Status: A&OX4, with some coaching. Telemetry: N/A  Skin: Intact. Left hand ecchymotic. Healed wound on left buttock.  Tubes: N/A IV: LFA, RAC - both NSL. Pain: Denies.  Family: No one at bedside. Living Situation: From home. Safety Measures: Yellow bracelet, yellow socks, bed alarm in place, call bell within reach. 6E Orientation: Oriented to unit and surroundings. Lunch tray ordered. Instructed to use call bell for assistance.  Joellen Jersey, RN.

## 2014-08-17 NOTE — Progress Notes (Signed)
Pt assisted to bathroom, pt voided and had small  soft BM

## 2014-08-17 NOTE — Progress Notes (Signed)
[facility] [place] Progress Note Patient Name: [name] DOB: 01-21-1956 MRN: [id number]   Date of Service  08/17/2014  HPI/Events of Note  Magnesium 1.5  eICU Interventions  Replaced IV     Intervention Category Minor Interventions: Electrolytes abnormality - evaluation and management  Mauri Brooklyn, P 08/17/2014, 5:15 AM

## 2014-08-18 LAB — COMPREHENSIVE METABOLIC PANEL
ALBUMIN: 2.2 g/dL — AB (ref 3.5–5.2)
ALT: 22 U/L (ref 0–53)
AST: 44 U/L — ABNORMAL HIGH (ref 0–37)
Alkaline Phosphatase: 89 U/L (ref 39–117)
Anion gap: 4 — ABNORMAL LOW (ref 5–15)
BUN: 17 mg/dL (ref 6–23)
CALCIUM: 8.1 mg/dL — AB (ref 8.4–10.5)
CO2: 26 mmol/L (ref 19–32)
Chloride: 105 mmol/L (ref 96–112)
Creatinine, Ser: 0.98 mg/dL (ref 0.50–1.35)
GFR calc Af Amer: >90 mL/min (ref >90)
GFR calc non Af Amer: 89 mL/min — ABNORMAL LOW (ref >90)
Glucose, Bld: 135 mg/dL — ABNORMAL HIGH (ref 70–99)
POTASSIUM: 3.7 mmol/L (ref 3.5–5.1)
SODIUM: 135 mmol/L (ref 135–145)
Total Bilirubin: 2.1 mg/dL — ABNORMAL HIGH (ref 0.3–1.2)
Total Protein: 5.2 g/dL — ABNORMAL LOW (ref 6.0–8.3)

## 2014-08-18 LAB — CBC
HEMATOCRIT: 25.7 % — AB (ref 39.0–52.0)
HEMOGLOBIN: 8.8 g/dL — AB (ref 13.0–17.0)
MCH: 32.6 pg (ref 26.0–34.0)
MCHC: 34.2 g/dL (ref 30.0–36.0)
MCV: 95.2 fL (ref 78.0–100.0)
Platelets: 49 10*3/uL — ABNORMAL LOW (ref 150–400)
RBC: 2.7 MIL/uL — AB (ref 4.22–5.81)
RDW: 16.8 % — ABNORMAL HIGH (ref 11.5–15.5)
WBC: 3 10*3/uL — AB (ref 4.0–10.5)

## 2014-08-18 LAB — AMMONIA: AMMONIA: 59 umol/L — AB (ref 11–32)

## 2014-08-18 LAB — GLUCOSE, CAPILLARY
Glucose-Capillary: 138 mg/dL — ABNORMAL HIGH (ref 70–99)
Glucose-Capillary: 138 mg/dL — ABNORMAL HIGH (ref 70–99)

## 2014-08-18 MED ORDER — INSULIN GLARGINE 100 UNIT/ML SOLOSTAR PEN: PEN_INJECTOR | SUBCUTANEOUS | Status: DC

## 2014-08-18 MED ORDER — OXYCODONE HCL 5 MG PO TABS: 5.0000 mg | ORAL_TABLET | Freq: Four times a day (QID) | ORAL | Status: DC | PRN

## 2014-08-18 MED ORDER — FUROSEMIDE 20 MG PO TABS: 20.0000 mg | ORAL_TABLET | Freq: Two times a day (BID) | ORAL | Status: DC

## 2014-08-18 MED ORDER — GABAPENTIN 600 MG PO TABS: 300.0000 mg | ORAL_TABLET | Freq: Three times a day (TID) | ORAL | Status: DC

## 2014-08-18 NOTE — Progress Notes (Signed)
Physical Therapy Treatment Patient Details Name: [name] MRN: [id number] DOB: 09/08/1955 Today's Date: 08/18/2014    History of Present Illness Patient is a 59 yo male admitted 08/14/14 with AMS, hepatic encephalopathy.  PMH:  alcoholic cirrhosis, DM, HTN, CVA, COPD, polysubstance abuse, bipolar disorder, depression    PT Comments    Pt much improved over initial PT eval on 3/26; Able to ambulate without loss of balance and without physical assist, however noted stiffness of trunk and gait, which decreases his ability to respond to balance perturbations and increases his fall risk; I favor him using a cane with amb for this reason;   Given multiple admissions recently, I favor increasing [place] services for chronic disease management and home safety assessments   Follow Up Recommendations  Home health PT;Other (comment) ([place], [place]; paged MD with these recs)     Equipment Recommendations  None recommended by PT (reports has a cane)    Recommendations for Other Services       Precautions / Restrictions Precautions Precautions: Fall Precaution Comments: Highly recommended to pt to use a cane for steadiness with amb    Mobility  Bed Mobility                  Transfers Overall transfer level: Needs assistance Equipment used: None Transfers: Sit to/from Stand Sit to Stand: Supervision         General transfer comment: Cues for safety  Ambulation/Gait Ambulation/Gait assistance: Min guard;Supervision Ambulation Distance (Feet): 250 Feet Assistive device: None Gait Pattern/deviations: Step-through pattern     General Gait Details: Noted trunk stiffness and staccato gait pattern; no gross loss of balance, however stiffness of trunk will decrease a person's ability to respond to balance perturbations, and increase fall risk   Stairs            Wheelchair Mobility    Modified Rankin (Stroke Patients Only)       Balance Overall balance assessment:  Needs assistance     Sitting balance - Comments: able to lean forward enough to pick up shoes from the floor while sitting on edge of chair; able to manage changing socks and donning shoes without assist     Standing balance-Leahy Scale: Fair                      Cognition Arousal/Alertness: Awake/alert Behavior During Therapy: WFL for tasks assessed/performed Overall Cognitive Status: No family/caregiver present to determine baseline cognitive functioning (tangential in conversation)                      Exercises      General Comments        Pertinent Vitals/Pain Pain Assessment: Faces Faces Pain Scale: Hurts a little bit Pain Location: R lateral elbow, shoulder Pain Descriptors / Indicators: Discomfort Pain Intervention(s): Monitored during session    Home Living                      Prior Function            PT Goals (current goals can now be found in the care plan section) Acute Rehab PT Goals Patient Stated Goal: None stated PT Goal Formulation: With patient Time For Goal Achievement: 08/30/14 Potential to Achieve Goals: Good Progress towards PT goals: Goals met and updated - see care plan    Frequency  Min 3X/week    PT Plan Discharge plan needs to be updated  Co-evaluation             End of Session Equipment Utilized During Treatment: Gait belt Activity Tolerance: Patient tolerated treatment well Patient left: in chair;with call bell/phone within reach     Time: 1137-1207 PT Time Calculation (min) (ACUTE ONLY): 30 min  Charges:  $Gait Training: 8-22 mins $Therapeutic Activity: 8-22 mins                    G Codes:      Quin Hoop 08/18/2014, 2:15 PM  Roney Marion, [place] Pager [phone] Office [phone]

## 2014-08-18 NOTE — Progress Notes (Signed)
Patient Discharge: Disposition: Patient discharged to home with home health. Education: Educated about medications, prescriptions, discharge instructions, diet, and follow-up appointment. IV: Discontinued 2 peripheral IV before discharge. Transportation: Patient transported in w/c accompanied by the transporter. Belongings: Patient took all his belongings with him.  Called significant other and talked to her about the patient discharge; she couldn't pick him up but called the cab to pick the pt.

## 2014-08-18 NOTE — Care Management Note (Signed)
CARE MANAGEMENT NOTE 08/18/2014  Patient:  [name], [name]   Account Number:  [id number]  Date Initiated:  08/01/2014  Documentation initiated by:  Tanaja Ganger  Subjective/Objective Assessment:   CM has been following for d/c need and progression.     Action/Plan:   07/31/14 Met with CSW and pt to assist pt in d/c planning. Final plan was to d/c to SNF for short term rehab.  08/01/2014 Met with pt who is very alert today and pleased with his plan for short term SNF, IM given.   Anticipated DC Date:  08/01/2014   Anticipated DC Plan:  [place]         Choice offered to / List presented to:          [facility] arranged  HH-1 RN      [place].   Status of service:  Completed, signed off Medicare Important Message given?  YES (If response is "NO", the following Medicare IM given date fields will be blank) Date Medicare IM given:  08/01/2014 Medicare IM given by:  Salimatou Simone Date Additional Medicare IM given:   Additional Medicare IM given by:    Discharge Disposition:  [place]  Per UR Regulation:    If discussed at Long Length of Stay Meetings, dates discussed:    Comments:  08/01/14 Notified by CSW that pt insurance has denied SNF rehab services. Pt will need HHRN for Medication management. Spoke with [facility] , they are out of network with this pt insurance, [facility] is able to provide services for this pt. Multiple attempts to contact pt friend, Serita Butcher and message left, no response as of 1645 today. Pt is unable to make a decision with out her input therefore this CM arranged HH with [facility] as they are in pt network. Jasmine Pang RN MPH, case manager [phone]

## 2014-08-18 NOTE — Discharge Summary (Signed)
[facility]  Physician Discharge Summary   Patient ID: [name] MRN: [id number] DOB/AGE: [birth year] 59 y.o.  Admit date: 08/14/2014 Discharge date: 08/18/2014  PCP: Barbette Merino, MD  DISCHARGE DIAGNOSES:  Active Problems:   Thrombocytopenia   Cirrhosis   Hepatic encephalopathy   Altered mental status   Hx of bipolar disorder   RECOMMENDATIONS FOR OUTPATIENT FOLLOW UP: 1. Compliance is his main issue 2. Lantus dose has been reduced.  DISCHARGE CONDITION: fair  Diet recommendation: Low sodium  Filed Weights   08/16/14 0500 08/17/14 0300 08/17/14 2115  Weight: 97 kg (213 lb 13.5 oz) 95.1 kg (209 lb 10.5 oz) 95.709 kg (211 lb)    INITIAL HISTORY: 59 year old male with PMH of alcoholic cirrhosis presented to [facility] ED after being found down at home. On EMS arrival he reportedly had GCS of 3. He was intubated in ED. He was admitted to the intensive care unit. He was noted to have elevated ammonia. He was started back on his home medications. His mental status started improving. Patient was subsequently extubated. He was then transferred to the hospitalist service.   Consultants: PCCM  Procedures: Intubation  2-D echocardiogram Study Conclusions - Left ventricle: The cavity size was moderately dilated. Systolicfunction was normal. The estimated ejection fraction was in therange of 60% to 65%. Doppler parameters are consistent withabnormal left ventricular relaxation (grade 1 diastolicdysfunction). - Aortic valve: Valve area (VTI): 1.83 cm^2. Valve area (Vmax):1.76 cm^2. Valve area (Vmean): 1.73 cm^2. - Left atrium: The atrium was mildly dilated.  HOSPITAL COURSE:   Acute respiratory failure requiring intubation This was secondary to altered mental status from hepatic encephalopathy. Patient was extubated once his encephalopathy improved. He remained stable. Respiratory failure appears to have resolved. Echocardiogram report as above.  Hepatic  encephalopathy Compliance appears to be an issue. Patient has been started back on his usual home medication regimen including lactulose and Xifaxan. Ammonia level was in the 80s at initial presentation. It has improved 59. His mental status is close to baseline. His amitriptyline and diazepam, having discontinued.  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 Continue Lasix.  Diabetes mellitus type 2. Patient is on Lantus at home. Continue monitoring blood sugars at home. CBGs here have not been greater than 150. Despite being off of his Lantus. His HbA1c was high 0.9. He will be discharged on a lower dose of Lantus. Patient knows to check his blood sugars regularly at home. He has been instructed to call his provider if he notices either high values, greater than 200s consistently, or low values, less than 100 consistently.   Enterococcus in urine, less than 100,000 colonies His UA was actually did not suggest infection. The patient did not have any symptoms. He was afebrile. At this time there is no indication to treat.  Normocytic anemia. Hemoglobin is stable.  Thrombocytopenia Secondary to liver cirrhosis. Counts are stable. No bleeding.   History of alcohol abuse. 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 Continue thiamine.  Patient is improved. He was initially seen by physical therapy when he was in the step down unit. At that time SNF was recommended. However, he is significantly improved in the last 2 days. He was once again evaluated today and home health has been recommended. This will be arranged.  Patient is keen on going home. He stable for discharge.   PERTINENT LABS:  The results of significant diagnostics from this hospitalization (including imaging, microbiology, ancillary and laboratory) are listed below for reference.  Microbiology: Recent Results (from the past 240 hour(s))  Urine culture     Status: None   Collection Time: 08/14/14  2:59 PM  Result  Value Ref Range Status   Specimen Description URINE, CATHETERIZED  Final   Special Requests NONE  Final   Colony Count   Final    80,000 COLONIES/ML Performed at [facility]    Culture   Final    ENTEROCOCCUS SPECIES Performed at [facility]    Report Status 08/16/2014 FINAL  Final   Organism ID, Bacteria ENTEROCOCCUS SPECIES  Final      Susceptibility   Enterococcus species - MIC*    AMPICILLIN <=2 SENSITIVE Sensitive     LEVOFLOXACIN 1 SENSITIVE Sensitive     NITROFURANTOIN <=16 SENSITIVE Sensitive     VANCOMYCIN 1 SENSITIVE Sensitive     TETRACYCLINE >=16 RESISTANT Resistant     * ENTEROCOCCUS SPECIES  MRSA PCR Screening     Status: None   Collection Time: 08/14/14  6:31 PM  Result Value Ref Range Status   MRSA by PCR NEGATIVE NEGATIVE Final    Comment:        The GeneXpert MRSA Assay (FDA approved for NASAL specimens only), is one component of a comprehensive MRSA colonization surveillance program. It is not intended to diagnose MRSA infection nor to guide or monitor treatment for MRSA infections.   Culture, blood (routine x 2)     Status: None (Preliminary result)   Collection Time: 08/14/14  7:10 PM  Result Value Ref Range Status   Specimen Description BLOOD RIGHT ANTECUBITAL  Final   Special Requests BOTTLES DRAWN AEROBIC AND ANAEROBIC 5CC  Final   Culture   Final           BLOOD CULTURE RECEIVED NO GROWTH TO DATE CULTURE WILL BE HELD FOR 5 DAYS BEFORE ISSUING A FINAL NEGATIVE REPORT Performed at [facility]    Report Status PENDING  Incomplete  Culture, blood (routine x 2)     Status: None (Preliminary result)   Collection Time: 08/14/14  7:15 PM  Result Value Ref Range Status   Specimen Description BLOOD RIGHT HAND  Final   Special Requests BOTTLES DRAWN AEROBIC AND ANAEROBIC 5CC  Final   Culture   Final           BLOOD CULTURE RECEIVED NO GROWTH TO DATE CULTURE WILL BE HELD FOR 5 DAYS BEFORE ISSUING A FINAL NEGATIVE  REPORT Performed at [facility]    Report Status PENDING  Incomplete     Labs: Basic Metabolic Panel:  Recent Labs Lab 08/14/14 1910 08/15/14 0330 08/16/14 0223 08/17/14 0303 08/18/14 0631  NA 137 138 140 137 135  K 4.9 4.2 4.3 3.7 3.7  CL 103 108 108 104 105  CO2 26 26 25 28 26   GLUCOSE 97 70 111* 114* 135*  BUN 20 21 20 16 17   CREATININE 1.36* 1.23 1.17 0.92 0.98  CALCIUM 8.2* 8.1* 7.9* 8.0* 8.1*  MG  --   --   --  1.5  --   PHOS  --   --   --  2.9  --    Liver Function Tests:  Recent Labs Lab 08/14/14 1445 08/18/14 0631  AST 51* 44*  ALT 27 22  ALKPHOS 117 89  BILITOT 2.6* 2.1*  PROT 6.6 5.2*  ALBUMIN 3.0* 2.2*    Recent Labs Lab 08/14/14 1910  LIPASE 27  AMYLASE 31    Recent Labs Lab  08/14/14 1457 08/18/14 0631  AMMONIA 83* 59*   CBC:  Recent Labs Lab 08/14/14 1445  08/15/14 0135 08/15/14 0330 08/16/14 0223 08/17/14 0303 08/18/14 0631  WBC 4.4  < > 3.8* 3.6* 3.9* 3.1* 3.0*  NEUTROABS 2.8  --   --   --   --   --   --   HGB 10.5*  < > 10.0* 10.1* 9.1* 9.0* 8.8*  HCT 31.6*  < > 29.7* 29.5* 26.9* 26.4* 25.7*  MCV 96.9  < > 95.8 95.8 96.1 94.3 95.2  PLT 55*  < > 48* 47* 44* 45* 49*  < > = values in this interval not displayed. Cardiac Enzymes:  Recent Labs Lab 08/14/14 1910 08/14/14 2141 08/15/14 0330  TROPONINI <0.03 <0.03 <0.03   BNP: BNP (last 3 results)  Recent Labs  08/14/14 1910  BNP 29.3   CBG:  Recent Labs Lab 08/17/14 1116 08/17/14 1725 08/17/14 2115 08/18/14 0742 08/18/14 1156  GLUCAP 170* 146* 150* 138* 138*     IMAGING STUDIES Dg Shoulder Right  08/17/2014   CLINICAL DATA:  Shoulder pain following fall a few days ago, initial encounter  EXAM: RIGHT SHOULDER - 2+ VIEW  COMPARISON:  None.  FINDINGS: There is no evidence of fracture or dislocation. There is no evidence of arthropathy or other focal bone abnormality. Soft tissues are unremarkable.  IMPRESSION: No acute abnormality noted.    Electronically Signed   By: Inez Catalina M.D.   On: 08/17/2014 20:58   Ct Head Wo Contrast  08/14/2014   CLINICAL DATA:  Altered mental status  EXAM: CT HEAD WITHOUT CONTRAST  TECHNIQUE: Contiguous axial images were obtained from the base of the skull through the vertex without intravenous contrast.  COMPARISON:  07/28/2014  FINDINGS: No skull fracture is noted. There is mucosal thickening with partial opacification bilateral sphenoid sinus. Mucosal thickening bilateral ethmoid air cells. The mastoid air cells are unremarkable. Abundant secretions are noted in nasopharynx.  No intracranial hemorrhage, mass effect or midline shift.  Stable mild cerebral atrophy. Stable mild periventricular chronic white matter disease. No definite acute cortical infarction. No mass lesion is noted on this unenhanced scan.  IMPRESSION: No acute intracranial abnormality. Paranasal sinuses as described above. Stable atrophy and chronic white matter disease. No definite acute cortical infarction. Abundant secretions are noted in nasopharynx.   Electronically Signed   By: Lahoma Crocker M.D.   On: 08/14/2014 16:53   Dg Chest Port 1 View  08/15/2014   CLINICAL DATA:  Respiratory failure.  EXAM: PORTABLE CHEST - 1 VIEW  COMPARISON:  08/14/2014.  FINDINGS: Endotracheal tube and NG tube in good anatomic position. Heart size normal. Pulmonary vascularity normal. Mild basilar atelectasis. No pleural effusion or pneumothorax. No acute osseous abnormality .  IMPRESSION: 1. Lines and tubes in stable position. 2. Low lung volumes with mild basilar atelectasis.   Electronically Signed   By: [place]   On: 08/15/2014 07:30   Dg Chest Port 1 View  08/14/2014   CLINICAL DATA:  Altered mental status.  EXAM: PORTABLE CHEST - 1 VIEW  COMPARISON:  02/10/2014  FINDINGS: Endotracheal tube with tip just below the clavicular heads. The gastric suction tube reaches the stomach, although the stomach remains moderately gas and fluid distended.  Low  lung volumes with interstitial crowding. No suspected pneumonia or edema. No effusion or pneumothorax.  Normal heart size and aortic contours for technique.  IMPRESSION: 1. Endotracheal and orogastric tubes are in good position. 2. Low lung  volumes.   Electronically Signed   By: Monte Fantasia M.D.   On: 08/14/2014 15:05    DISCHARGE EXAMINATION: Filed Vitals:   08/17/14 1702 08/17/14 2115 08/18/14 0509 08/18/14 0933  BP: 118/54 113/63 107/49 113/61  Pulse: 74 69 66 72  Temp: 98.4 F (36.9 C) 98.2 F (36.8 C) 97.8 F (36.6 C) 98 F (36.7 C)  TempSrc: Oral   Oral  Resp: 17 18 18 18   Height:      Weight:  95.709 kg (211 lb)    SpO2: 98% 97% 97% 98%   General appearance: alert, cooperative, appears stated age and no distress Resp: clear to auscultation bilaterally Cardio: regular rate and rhythm, S1, S2 normal, no murmur, click, rub or gallop GI: soft, non-tender; bowel sounds normal; no masses,  no organomegaly Extremities: extremities normal, atraumatic, no cyanosis or edema Neurologic: Alert and oriented 3. No focal neurological deficits noted.  DISPOSITION: Home with family  Discharge Instructions    Call MD for:  difficulty breathing, headache or visual disturbances    Complete by:  As directed      Call MD for:  extreme fatigue    Complete by:  As directed      Call MD for:  persistant dizziness or light-headedness    Complete by:  As directed      Call MD for:  persistant nausea and vomiting    Complete by:  As directed      Call MD for:  redness, tenderness, or signs of infection (pain, swelling, redness, odor or green/yellow discharge around incision site)    Complete by:  As directed      Call MD for:  severe uncontrolled pain    Complete by:  As directed      Call MD for:  temperature >100.4    Complete by:  As directed      Diet - low sodium heart healthy    Complete by:  As directed      Diet Carb Modified    Complete by:  As directed      Discharge  instructions    Complete by:  As directed   Check your blood sugars at home three times a day. Talk to your doctor if the levels are greater than 200 or less than 90 consistently. Please see you doctor by early next week. Take your medications as prescribed.     Increase activity slowly    Complete by:  As directed            ALLERGIES: No Known Allergies   Discharge Medication List as of 08/18/2014  1:00 PM    START taking these medications   Details  oxyCODONE (OXY IR/ROXICODONE) 5 MG immediate release tablet Take 1 tablet (5 mg total) by mouth every 6 (six) hours as needed for moderate pain., Starting 08/18/2014, Until Discontinued, Print      CONTINUE these medications which have CHANGED   Details  furosemide (LASIX) 20 MG tablet Take 1 tablet (20 mg total) by mouth 2 (two) times daily., Starting 08/18/2014, Until Discontinued, Print    gabapentin (NEURONTIN) 600 MG tablet Take 0.5 tablets (300 mg total) by mouth 3 (three) times daily., Starting 08/18/2014, Until Discontinued, Print    Insulin Glargine (LANTUS SOLOSTAR) 100 UNIT/ML Solostar Pen Inject 20 units into the skin at bedtime: For diabetes, Print      CONTINUE these medications which have NOT CHANGED   Details  ibuprofen (ADVIL,MOTRIN) 800 MG tablet Take 800  mg by mouth every 8 (eight) hours as needed., Until Discontinued, Historical Med    insulin aspart (NOVOLOG FLEXPEN) 100 UNIT/ML FlexPen Inject 6 Units into the skin 3 (three) times daily with meals. For diabetes, Starting 02/19/2014, Until Discontinued, No Print    lactulose (CHRONULAC) 10 GM/15ML solution Take 45 mLs (30 g total) by mouth 3 (three) times daily., Starting 08/01/2014, Until Discontinued, Print    lisinopril (PRINIVIL,ZESTRIL) 20 MG tablet Take 20 mg by mouth daily., Until Discontinued, Historical Med    Multiple Vitamin (MULTIVITAMIN WITH MINERALS) TABS tablet Take 1 tablet by mouth daily., Starting 07/31/2014, Until Discontinued, No Print     pantoprazole (PROTONIX) 40 MG tablet Take 40 mg by mouth 2 (two) times daily., Starting 07/09/2014, Until Discontinued, Historical Med    rifaximin (XIFAXAN) 550 MG TABS tablet Take 1 tablet (550 mg total) by mouth 2 (two) times daily. For IBS, Starting 02/19/2014, Until Discontinued, No Print    diclofenac sodium (VOLTAREN) 1 % GEL Apply 4 g topically as needed (for back of leg pain)., Until Discontinued, Historical Med    folic acid (FOLVITE) 1 MG tablet Take 1 tablet (1 mg total) by mouth daily., Starting 07/31/2014, Until Discontinued, No Print    spironolactone (ALDACTONE) 100 MG tablet Take 1 tablet (100 mg total) by mouth daily., Starting 07/31/2014, Until Discontinued, No Print    thiamine 100 MG tablet Take 1 tablet (100 mg total) by mouth daily., Starting 07/31/2014, Until Discontinued, No Print      STOP taking these medications     amitriptyline (ELAVIL) 25 MG tablet      diazepam (VALIUM) 2 MG tablet        Follow-up Information    Follow up with GARBA,LAWAL, MD. Schedule an appointment as soon as possible for a visit in 1 week.   Specialty:  Internal Medicine   Why:  post hospitalization follow up   Contact information:   [address]. [place] [address] [phone]       TOTAL DISCHARGE TIME: 65 minutes  [facility] Pager [phone]  08/18/2014, 3:57 PM

## 2014-08-18 NOTE — Discharge Instructions (Signed)
Hepatic Encephalopathy °Hepatic encephalopathy is a syndrome. This is a set of symptoms that occur together. It is seen mostly in patients with damage to the liver known as cirrhosis. This is where normal liver tissue has been replaced by scar tissue.  °Symptoms of the syndrome include: °· Changes in personality. °· Mental impairment. °· A depressed level of consciousness. °These changes occur because toxins build up in the bloodstream. The build up occurs because the scarred liver cannot rid toxins from the body. The most important of these toxins is ammonia. Toxins can cause abnormal behavior and confusion. Toxins in the blood stream can impair your ability to take care of yourself or others. Some people become very sleepy and cannot be woken easily. In severe cases, the patient lapses into a coma.  °CAUSES  °There are many things that can cause liver damage that can lead to buildup of toxins. These include: °· Diseases that cause cirrhosis of the liver. °· Long-term alcohol use with progressive liver damage. °· Hepatitis B or C with ongoing infection and liver damage. °· Patients without cirrhosis who have undergone shunt surgery. °· Kidney failure. °· Bleeding in the stomach or intestines. °· Infection. °· Constipation. °· Medications that act upon the central nervous system. °· Diuretic therapy. °· Excessive dietary protein. °SYMPTOMS  °Symptoms of this syndrome are categorized or "staged" based on severity.  °· Stage 0. Minimal hepatic encephalopathy. No detectable changes in personality or behavior. Minimal changes in memory, concentration, mental function, and physical ability. °· Stage 1. Some lack of awareness. Shortened attention span. Problems with addition or subtraction. Possible problems with sleeping or a reversal of the normal sleep pattern. Euphoria, depression, or irritability may be present. Mild confusion. Slowing of mental ability. Tremors may be detected. °· Stage 2. Lethargy or apathy.  Disoriented. Strange behavior. Slurred speech. Obvious tremors. Drowsiness, unable to perform mental tasks. Personality changes, and confusion about time. °· Stage 3. Very sleepy but can be aroused. Unable to perform mental tasks, cannot keep track of time and place, marked confusion, amnesia, occasional fits of rage, speech cannot be understood. °· Stage 4. Coma with or without response to painful stimuli. °DIAGNOSIS  °In mild cases, a careful history and physical exam may lead your caregiver to consider possible mild hepatic encephalopathy as the cause of symptoms. The diagnosis is clearer in more severe cases. An elevated blood ammonia level is the classic blood test abnormality in patients with this syndrome. Other tests can be helpful to rule out other diseases.  °TREATMENT  °· Medications are often used to lower the ammonia level in the blood. This usually leads to improvement. °· Diets containing vegetable proteins are better than diets rich in animal protein, especially proteins derived from red meats. Eating well-cooked chicken and fish in addition to vegetable protein should be discussed with your caregiver. Malnourished patients are encouraged to add liquid nutritional supplements to their diet. °· Antibiotics are sometimes used to try to lessen the volume of bacteria in the intestines that produce ammonia. °· Moderate to severe cases of this syndrome usually require a hospital stay and medicine that is given directly into a vein (intravenously). °HOME CARE INSTRUCTIONS  °The goal at home is to avoid things that can make the condition worse and lead to a buildup of ammonia in the blood. °· Eat a well balanced diet. Your caregiver can help you with suggestions on this. °· Talk to your caregiver before taking vitamin supplements. Large doses of vitamins and minerals,   especially vitamin A, iron, or copper, can worsen liver damage. °· A low salt diet, water restriction, or diuretic medicine may be needed to  reduce fluid retention. °· Avoid alcohol and acetaminophen as well as any over-the-counter medications that contain acetaminophen (check labels). Only take over-the-counter or prescription medicines for pain, discomfort, or fever as directed by your caregiver. °· Avoid drugs that are toxic to the liver. Review your medications (both prescription and non-prescription) with your caregiver to make sure those you are taking will not be harmful. °· Blood tests may be needed. Follow your caregiver's advice regarding the timing of these. °· With this condition you play a critical role in maintaining your own good health. The failure to follow your caregiver's advice and these instructions may result in permanent disability or death. °SEEK MEDICAL CARE IF:  °· You have increasing fatigue or weakness. °· You develop increasing swelling of the abdomen, hands, feet, legs or face. °· You develop loss of appetite. °· You are feeling sick to your stomach (nausea) and vomiting. °· You develop jaundice. This is a yellow discoloration of the skin. °· You develop worsening problems with concentration, confusion, and/or problems with sleep. °SEEK IMMEDIATE MEDICAL CARE IF:  °· You vomit bright red blood or a coffee ground-looking material. °· You have blood in your stools. Or the stools turn black and tarry. °· You have a fever. °· You develop easy bruising or bleeding. °· You have a return of slurred speech, change in behavior, or confusion. °MAKE SURE YOU:  °· Understand these instructions. °· Will watch your condition. °· Will get help right away if you are not doing well or get worse. °Document Released: 07/19/2006 Document Revised: 08/01/2011 Document Reviewed: 04/25/2007 °[employer]® Patient Information ©2015 [employer]. This information is not intended to replace advice given to you by your health care provider. Make sure you discuss any questions you have with your health care provider. ° °

## 2014-08-21 LAB — CULTURE, BLOOD (ROUTINE X 2)
CULTURE: NO GROWTH
Culture: NO GROWTH

## 2014-08-26 ENCOUNTER — Encounter (HOSPITAL_COMMUNITY): Payer: Self-pay | Admitting: *Deleted

## 2014-08-26 ENCOUNTER — Emergency Department (HOSPITAL_COMMUNITY)
Admission: EM | Admit: 2014-08-26 | Discharge: 2014-08-26 | Disposition: A | Discharge status: Home / Self Care | Payer: Medicare PPO | Source: Home / Self Care | Attending: Emergency Medicine | Admitting: Emergency Medicine

## 2014-08-26 DIAGNOSIS — Z79899 Other long term (current) drug therapy: Secondary | ICD-10-CM | POA: Insufficient documentation

## 2014-08-26 DIAGNOSIS — I10 Essential (primary) hypertension: Secondary | ICD-10-CM | POA: Insufficient documentation

## 2014-08-26 DIAGNOSIS — M1990 Unspecified osteoarthritis, unspecified site: Secondary | ICD-10-CM

## 2014-08-26 DIAGNOSIS — G629 Polyneuropathy, unspecified: Secondary | ICD-10-CM

## 2014-08-26 DIAGNOSIS — R600 Localized edema: Secondary | ICD-10-CM | POA: Insufficient documentation

## 2014-08-26 DIAGNOSIS — K219 Gastro-esophageal reflux disease without esophagitis: Secondary | ICD-10-CM

## 2014-08-26 DIAGNOSIS — Z720 Tobacco use: Secondary | ICD-10-CM

## 2014-08-26 DIAGNOSIS — Z794 Long term (current) use of insulin: Secondary | ICD-10-CM

## 2014-08-26 DIAGNOSIS — Z8673 Personal history of transient ischemic attack (TIA), and cerebral infarction without residual deficits: Secondary | ICD-10-CM

## 2014-08-26 DIAGNOSIS — D649 Anemia, unspecified: Secondary | ICD-10-CM | POA: Insufficient documentation

## 2014-08-26 DIAGNOSIS — Z792 Long term (current) use of antibiotics: Secondary | ICD-10-CM

## 2014-08-26 DIAGNOSIS — E119 Type 2 diabetes mellitus without complications: Secondary | ICD-10-CM

## 2014-08-26 DIAGNOSIS — K7040 Alcoholic hepatic failure without coma: Secondary | ICD-10-CM | POA: Diagnosis not present

## 2014-08-26 DIAGNOSIS — J449 Chronic obstructive pulmonary disease, unspecified: Secondary | ICD-10-CM | POA: Insufficient documentation

## 2014-08-26 DIAGNOSIS — F319 Bipolar disorder, unspecified: Secondary | ICD-10-CM

## 2014-08-26 DIAGNOSIS — G8929 Other chronic pain: Secondary | ICD-10-CM | POA: Insufficient documentation

## 2014-08-26 DIAGNOSIS — G9340 Encephalopathy, unspecified: Secondary | ICD-10-CM | POA: Diagnosis not present

## 2014-08-26 MED ORDER — KETOROLAC TROMETHAMINE 60 MG/2ML IM SOLN
60.0000 mg | Freq: Once | INTRAMUSCULAR | Status: AC
  Administered 2014-08-26: 60 mg via INTRAMUSCULAR
  Filled 2014-08-26: qty 2

## 2014-08-26 NOTE — ED Provider Notes (Signed)
CSN: [id number]     Arrival date & time 08/26/14  1640 History   First MD Initiated Contact with Patient 08/26/14 1728     Chief Complaint  Patient presents with  . Pain     (Consider location/radiation/quality/duration/timing/severity/associated sxs/prior Treatment) HPI  [name] is a 59 y.o. male with PMH of diabetes, hypertension, cirrhosis, hepatic encephalopathy, hypertension, GERD presenting with generalized pain worse in bilateral feet with worsening today. Patient is out of his pain medicines oxycodone. He states he did have his insulin and Lasix filled. Pain is worse with movement described as throbbing. Patient has not taken anything for his symptoms. No numbness, tingling weakness. Pt with swelling but reports its better than normal.   Past Medical History  Diagnosis Date  . Neuropathy   . Diabetes mellitus   . Bipolar affect, depressed   . Hypertension   . Arthritis   . Stroke     Mini stroke about 47yrs ago  . Cirrhosis   . Alcohol abuse   . Chronic pain   . Cocaine abuse   . Muscle spasm     both legs  . Encephalopathy, hepatic   . Detached retina   . COPD (chronic obstructive pulmonary disease)     emphysema  . Bronchitis   . Barrett's esophagus   . GERD (gastroesophageal reflux disease)     has ulcer  . Anemia    Past Surgical History  Procedure Laterality Date  . Fracture surgery      Leg and arm 57yrs ago  . Esophagogastroduodenoscopy  04/04/2012    Procedure: ESOPHAGOGASTRODUODENOSCOPY (EGD);  Surgeon: Irene Shipper, MD;  Location: [facility] ENDOSCOPY;  Service: Endoscopy;  Laterality: N/A;  . Esophagogastroduodenoscopy Left 03/13/2013    Procedure: ESOPHAGOGASTRODUODENOSCOPY (EGD);  Surgeon: Arta Silence, MD;  Location: [facility] ENDOSCOPY;  Service: Endoscopy;  Laterality: Left;  Marland Kitchen Eye surgery  8 months ago both eyes    cataracts both eyes, detached eye, gas pocket  . Vasectomy    . Pars plana vitrectomy Left 07/08/2013    Procedure: PARS PLANA VITRECTOMY  WITH 25 GAUGE;  Surgeon: Hurman Horn, MD;  Location: [place];  Service: Ophthalmology;  Laterality: Left;  . Intraocular lens removal Left 07/08/2013    Procedure: REMOVAL OF INTRAOCULAR LENS;  Surgeon: Hurman Horn, MD;  Location: [place];  Service: Ophthalmology;  Laterality: Left;  . Placement and suture of secondary intraocular lens Left 07/08/2013    Procedure: PLACEMENT AND SUTURE OF SECONDARY INTRAOCULAR LENS;  Surgeon: Hurman Horn, MD;  Location: [place];  Service: Ophthalmology;  Laterality: Left;  Insertion of Anterior Capsule Intraocular Lens   . Esophagogastroduodenoscopy N/A 01/16/2014    Procedure: ESOPHAGOGASTRODUODENOSCOPY (EGD);  Surgeon: Winfield Cunas., MD;  Location: [facility] ENDOSCOPY;  Service: Endoscopy;  Laterality: N/A;  . Colonoscopy N/A 01/17/2014    Procedure: COLONOSCOPY;  Surgeon: Winfield Cunas., MD;  Location: [facility] ENDOSCOPY;  Service: Endoscopy;  Laterality: N/A;  possible banding   Family History  Problem Relation Age of Onset  . Hypotension Mother    History  Substance Use Topics  . Smoking status: Current Every Day Smoker -- 1.00 packs/day for 30 years    Types: Cigarettes    Last Attempt to Quit: 04/06/2012  . Smokeless tobacco: Never Used     Comment: quit   . Alcohol Use: 0.0 oz/week     Comment: 12 pk beer daily  06/2013 - no alcohol since 11/2012    Review  of Systems  Musculoskeletal: Positive for myalgias. Negative for joint swelling.  Skin: Negative for color change and wound.  Neurological: Negative for weakness and numbness.      Allergies  Review of patient's allergies indicates no known allergies.  Home Medications   Prior to Admission medications   Medication Sig Start Date End Date Taking? Authorizing Provider  diclofenac sodium (VOLTAREN) 1 % GEL Apply 4 g topically as needed (for back of leg pain).    Historical Provider, MD  folic acid (FOLVITE) 1 MG tablet Take 1 tablet (1 mg total) by mouth daily. Patient not taking: Reported on  08/15/2014 07/31/14   Geradine Girt, DO  furosemide (LASIX) 20 MG tablet Take 1 tablet (20 mg total) by mouth 2 (two) times daily. 08/18/14   Bonnielee Haff, MD  gabapentin (NEURONTIN) 600 MG tablet Take 0.5 tablets (300 mg total) by mouth 3 (three) times daily. 08/18/14   Bonnielee Haff, MD  ibuprofen (ADVIL,MOTRIN) 800 MG tablet Take 800 mg by mouth every 8 (eight) hours as needed.    Historical Provider, MD  insulin aspart (NOVOLOG FLEXPEN) 100 UNIT/ML FlexPen Inject 6 Units into the skin 3 (three) times daily with meals. For diabetes 02/19/14   Encarnacion Slates, NP  Insulin Glargine (LANTUS SOLOSTAR) 100 UNIT/ML Solostar Pen Inject 20 units into the skin at bedtime: For diabetes 08/18/14   Bonnielee Haff, MD  lactulose (CHRONULAC) 10 GM/15ML solution Take 45 mLs (30 g total) by mouth 3 (three) times daily. Patient taking differently: Take 30 g by mouth daily. Take every day per spouse 08/01/14   Geradine Girt, DO  lisinopril (PRINIVIL,ZESTRIL) 20 MG tablet Take 20 mg by mouth daily.    Historical Provider, MD  Multiple Vitamin (MULTIVITAMIN WITH MINERALS) TABS tablet Take 1 tablet by mouth daily. 07/31/14   Geradine Girt, DO  oxyCODONE (OXY IR/ROXICODONE) 5 MG immediate release tablet Take 1 tablet (5 mg total) by mouth every 6 (six) hours as needed for moderate pain. 08/18/14   Bonnielee Haff, MD  pantoprazole (PROTONIX) 40 MG tablet Take 40 mg by mouth 2 (two) times daily. 07/09/14   Historical Provider, MD  rifaximin (XIFAXAN) 550 MG TABS tablet Take 1 tablet (550 mg total) by mouth 2 (two) times daily. For IBS 02/19/14   Encarnacion Slates, NP  spironolactone (ALDACTONE) 100 MG tablet Take 1 tablet (100 mg total) by mouth daily. Patient not taking: Reported on 08/15/2014 07/31/14   Geradine Girt, DO  thiamine 100 MG tablet Take 1 tablet (100 mg total) by mouth daily. Patient not taking: Reported on 08/15/2014 07/31/14   Jessica U Vann, DO   BP 121/60 mmHg  Pulse 86  Temp(Src) 98 F (36.7 C) (Oral)  Resp  18  SpO2 99% Physical Exam  Constitutional: He is oriented to person, place, and time. He appears well-developed and well-nourished. No distress.  HENT:  Head: Normocephalic and atraumatic.  Eyes: Conjunctivae and EOM are normal. Pupils are equal, round, and reactive to light. Right eye exhibits no discharge. Left eye exhibits no discharge.  Cardiovascular: Normal rate and regular rhythm.   Pulmonary/Chest: Effort normal. No respiratory distress. He has no wheezes.  Abdominal: Soft. Bowel sounds are normal. He exhibits no distension. There is no tenderness.  Musculoskeletal:  2+ pitting edema bilaterally.  Neurological: He is alert and oriented to person, place, and time. He exhibits normal muscle tone. Coordination normal.  Speech fluent and goal oriented. Patient moves all four extremities without any  focal neurological deficits and has no facial droop. Normal gait.  Skin: Skin is warm and dry. He is not diaphoretic.  Nursing note and vitals reviewed.   ED Course  Procedures (including critical care time) Labs Review Labs Reviewed - No data to display  Imaging Review No results found.   EKG Interpretation None      MDM   Final diagnoses:  Chronic pain   Patient presenting with pain everywhere worse in bilateral feet with reported decreased in swelling which is equal bilaterally. Patient on oxycodone chronically provided by patient's PCP with 30 day prescription filled March 14 as well as supplemental oxycodone after discharge from the hospital on the 28th. VSS. Normal neurological exam. Pt ambulatory. Pt pain managed in ED. No script for narcotics due to the chronic nature of patient's pain. Pt agreeable. Pt to follow up with PCP. Discussed return precautions.   Al Corpus, PA-C 08/26/14 2031  Evelina Bucy, MD 08/26/14 [phone]

## 2014-08-26 NOTE — Discharge Instructions (Signed)
Return to the emergency room with worsening of symptoms, new symptoms or with symptoms that are concerning , especially chest pain that feels like a pressure, spreads to left arm or jaw, worse with exertion, associated with nausea, vomiting, shortness of breath and/or sweating.  Follow up with PCP as soon as possible, call to make earlier appointment. Read below information and follow recommendations.  Chronic Pain Chronic pain can be defined as pain that is off and on and lasts for 3-6 months or longer. Many things cause chronic pain, which can make it difficult to make a diagnosis. There are many treatment options available for chronic pain. However, finding a treatment that works well for you may require trying various approaches until the right one is found. Many people benefit from a combination of two or more types of treatment to control their pain. SYMPTOMS  Chronic pain can occur anywhere in the body and can range from mild to very severe. Some types of chronic pain include:  Headache.  Low back pain.  Cancer pain.  Arthritis pain.  Neurogenic pain. This is pain resulting from damage to nerves. People with chronic pain may also have other symptoms such as:  Depression.  Anger.  Insomnia.  Anxiety. DIAGNOSIS  Your health care provider will help diagnose your condition over time. In many cases, the initial focus will be on excluding possible conditions that could be causing the pain. Depending on your symptoms, your health care provider may order tests to diagnose your condition. Some of these tests may include:   Blood tests.   CT scan.   MRI.   X-rays.   Ultrasounds.   Nerve conduction studies.  You may need to see a specialist.  TREATMENT  Finding treatment that works well may take time. You may be referred to a pain specialist. He or she may prescribe medicine or therapies, such as:   Mindful meditation or yoga.  Shots (injections) of numbing or  pain-relieving medicines into the spine or area of pain.  Local electrical stimulation.  Acupuncture.   Massage therapy.   Aroma, color, light, or sound therapy.   Biofeedback.   Working with a physical therapist to keep from getting stiff.   Regular, gentle exercise.   Cognitive or behavioral therapy.   Group support.  Sometimes, surgery may be recommended.  HOME CARE INSTRUCTIONS   Take all medicines as directed by your health care provider.   Lessen stress in your life by relaxing and doing things such as listening to calming music.   Exercise or be active as directed by your health care provider.   Eat a healthy diet and include things such as vegetables, fruits, fish, and lean meats in your diet.   Keep all follow-up appointments with your health care provider.   Attend a support group with others suffering from chronic pain. SEEK MEDICAL CARE IF:   Your pain gets worse.   You develop a new pain that was not there before.   You cannot tolerate medicines given to you by your health care provider.   You have new symptoms since your last visit with your health care provider.  SEEK IMMEDIATE MEDICAL CARE IF:   You feel weak.   You have decreased sensation or numbness.   You lose control of bowel or bladder function.   Your pain suddenly gets much worse.   You develop shaking.  You develop chills.  You develop confusion.  You develop chest pain.  You develop shortness of  breath.  MAKE SURE YOU:  Understand these instructions.  Will watch your condition.  Will get help right away if you are not doing well or get worse. Document Released: 01/29/2002 Document Revised: 01/09/2013 Document Reviewed: 11/02/2012 [facility] Patient Information 2015 [place], [place]. This information is not intended to replace advice given to you by your health care provider. Make sure you discuss any questions you have with your health care provider.

## 2014-08-26 NOTE — ED Notes (Signed)
Pt was able to ambulate effectively with cane.

## 2014-08-26 NOTE — ED Notes (Signed)
Pt in by ems, c/o chronic pain all over 2/2 neuropathy, out of pain meds (oxycodone), insulin, lasix.

## 2014-08-29 ENCOUNTER — Other Ambulatory Visit (HOSPITAL_COMMUNITY): Payer: Self-pay

## 2014-08-29 ENCOUNTER — Emergency Department (HOSPITAL_COMMUNITY): Payer: Medicare PPO

## 2014-08-29 ENCOUNTER — Inpatient Hospital Stay (HOSPITAL_COMMUNITY)
Admission: EM | Admit: 2014-08-29 | Discharge: 2014-09-03 | DRG: 432 | Disposition: A | Discharge status: Skilled Nursing Facility | Payer: Medicare PPO | Attending: Internal Medicine | Admitting: Internal Medicine

## 2014-08-29 ENCOUNTER — Inpatient Hospital Stay (HOSPITAL_COMMUNITY): Payer: Medicare PPO

## 2014-08-29 DIAGNOSIS — K766 Portal hypertension: Secondary | ICD-10-CM | POA: Diagnosis present

## 2014-08-29 DIAGNOSIS — R4189 Other symptoms and signs involving cognitive functions and awareness: Secondary | ICD-10-CM | POA: Insufficient documentation

## 2014-08-29 DIAGNOSIS — Z8673 Personal history of transient ischemic attack (TIA), and cerebral infarction without residual deficits: Secondary | ICD-10-CM

## 2014-08-29 DIAGNOSIS — D638 Anemia in other chronic diseases classified elsewhere: Secondary | ICD-10-CM | POA: Diagnosis present

## 2014-08-29 DIAGNOSIS — J9690 Respiratory failure, unspecified, unspecified whether with hypoxia or hypercapnia: Secondary | ICD-10-CM | POA: Diagnosis not present

## 2014-08-29 DIAGNOSIS — I8500 Esophageal varices without bleeding: Secondary | ICD-10-CM | POA: Diagnosis present

## 2014-08-29 DIAGNOSIS — Q2733 Arteriovenous malformation of digestive system vessel: Secondary | ICD-10-CM | POA: Diagnosis not present

## 2014-08-29 DIAGNOSIS — K7031 Alcoholic cirrhosis of liver with ascites: Secondary | ICD-10-CM | POA: Diagnosis not present

## 2014-08-29 DIAGNOSIS — Z794 Long term (current) use of insulin: Secondary | ICD-10-CM | POA: Diagnosis not present

## 2014-08-29 DIAGNOSIS — R401 Stupor: Secondary | ICD-10-CM | POA: Diagnosis not present

## 2014-08-29 DIAGNOSIS — G9340 Encephalopathy, unspecified: Secondary | ICD-10-CM | POA: Diagnosis present

## 2014-08-29 DIAGNOSIS — N179 Acute kidney failure, unspecified: Secondary | ICD-10-CM | POA: Diagnosis present

## 2014-08-29 DIAGNOSIS — K219 Gastro-esophageal reflux disease without esophagitis: Secondary | ICD-10-CM | POA: Diagnosis present

## 2014-08-29 DIAGNOSIS — D62 Acute posthemorrhagic anemia: Secondary | ICD-10-CM | POA: Diagnosis present

## 2014-08-29 DIAGNOSIS — B952 Enterococcus as the cause of diseases classified elsewhere: Secondary | ICD-10-CM | POA: Diagnosis present

## 2014-08-29 DIAGNOSIS — K922 Gastrointestinal hemorrhage, unspecified: Secondary | ICD-10-CM | POA: Diagnosis not present

## 2014-08-29 DIAGNOSIS — F17210 Nicotine dependence, cigarettes, uncomplicated: Secondary | ICD-10-CM | POA: Diagnosis present

## 2014-08-29 DIAGNOSIS — K3189 Other diseases of stomach and duodenum: Secondary | ICD-10-CM | POA: Diagnosis present

## 2014-08-29 DIAGNOSIS — F319 Bipolar disorder, unspecified: Secondary | ICD-10-CM | POA: Diagnosis present

## 2014-08-29 DIAGNOSIS — K7030 Alcoholic cirrhosis of liver without ascites: Secondary | ICD-10-CM | POA: Diagnosis not present

## 2014-08-29 DIAGNOSIS — I5043 Acute on chronic combined systolic (congestive) and diastolic (congestive) heart failure: Secondary | ICD-10-CM | POA: Diagnosis present

## 2014-08-29 DIAGNOSIS — K7040 Alcoholic hepatic failure without coma: Secondary | ICD-10-CM | POA: Diagnosis present

## 2014-08-29 DIAGNOSIS — E872 Acidosis: Secondary | ICD-10-CM | POA: Diagnosis present

## 2014-08-29 DIAGNOSIS — E119 Type 2 diabetes mellitus without complications: Secondary | ICD-10-CM | POA: Diagnosis present

## 2014-08-29 DIAGNOSIS — J449 Chronic obstructive pulmonary disease, unspecified: Secondary | ICD-10-CM | POA: Diagnosis present

## 2014-08-29 DIAGNOSIS — Z79899 Other long term (current) drug therapy: Secondary | ICD-10-CM | POA: Diagnosis not present

## 2014-08-29 DIAGNOSIS — K7290 Hepatic failure, unspecified without coma: Secondary | ICD-10-CM

## 2014-08-29 DIAGNOSIS — I10 Essential (primary) hypertension: Secondary | ICD-10-CM | POA: Diagnosis present

## 2014-08-29 DIAGNOSIS — J9600 Acute respiratory failure, unspecified whether with hypoxia or hypercapnia: Secondary | ICD-10-CM | POA: Diagnosis not present

## 2014-08-29 DIAGNOSIS — I5041 Acute combined systolic (congestive) and diastolic (congestive) heart failure: Secondary | ICD-10-CM | POA: Insufficient documentation

## 2014-08-29 DIAGNOSIS — D696 Thrombocytopenia, unspecified: Secondary | ICD-10-CM | POA: Diagnosis present

## 2014-08-29 DIAGNOSIS — N390 Urinary tract infection, site not specified: Secondary | ICD-10-CM | POA: Diagnosis present

## 2014-08-29 DIAGNOSIS — F1010 Alcohol abuse, uncomplicated: Secondary | ICD-10-CM | POA: Diagnosis present

## 2014-08-29 DIAGNOSIS — R680 Hypothermia, not associated with low environmental temperature: Secondary | ICD-10-CM | POA: Diagnosis present

## 2014-08-29 DIAGNOSIS — Z9114 Patient's other noncompliance with medication regimen: Secondary | ICD-10-CM | POA: Diagnosis present

## 2014-08-29 DIAGNOSIS — R404 Transient alteration of awareness: Secondary | ICD-10-CM | POA: Diagnosis not present

## 2014-08-29 LAB — CBC WITH DIFFERENTIAL/PLATELET
BASOS PCT: 0 % (ref 0–1)
Basophils Absolute: 0 10*3/uL (ref 0.0–0.1)
EOS PCT: 0 % (ref 0–5)
Eosinophils Absolute: 0 10*3/uL (ref 0.0–0.7)
HEMATOCRIT: 33.1 % — AB (ref 39.0–52.0)
HEMOGLOBIN: 11.1 g/dL — AB (ref 13.0–17.0)
Lymphocytes Relative: 9 % — ABNORMAL LOW (ref 12–46)
Lymphs Abs: 0.4 10*3/uL — ABNORMAL LOW (ref 0.7–4.0)
MCH: 32.1 pg (ref 26.0–34.0)
MCHC: 33.5 g/dL (ref 30.0–36.0)
MCV: 95.7 fL (ref 78.0–100.0)
MONO ABS: 0.4 10*3/uL (ref 0.1–1.0)
MONOS PCT: 8 % (ref 3–12)
NEUTROS ABS: 4.3 10*3/uL (ref 1.7–7.7)
Neutrophils Relative %: 84 % — ABNORMAL HIGH (ref 43–77)
Platelets: 72 10*3/uL — ABNORMAL LOW (ref 150–400)
RBC: 3.46 MIL/uL — ABNORMAL LOW (ref 4.22–5.81)
RDW: 16.9 % — ABNORMAL HIGH (ref 11.5–15.5)
WBC: 5.2 10*3/uL (ref 4.0–10.5)

## 2014-08-29 LAB — TYPE AND SCREEN
ABO/RH(D): O POS
ANTIBODY SCREEN: NEGATIVE

## 2014-08-29 LAB — I-STAT ARTERIAL BLOOD GAS, ED
ACID-BASE DEFICIT: 6 mmol/L — AB (ref 0.0–2.0)
Acid-base deficit: 5 mmol/L — ABNORMAL HIGH (ref 0.0–2.0)
Bicarbonate: 17.3 mEq/L — ABNORMAL LOW (ref 20.0–24.0)
Bicarbonate: 18.9 mEq/L — ABNORMAL LOW (ref 20.0–24.0)
O2 SAT: 99 %
O2 SAT: 99 %
PCO2 ART: 28.2 mmHg — AB (ref 35.0–45.0)
PH ART: 7.408 (ref 7.350–7.450)
TCO2: 18 mmol/L (ref 0–100)
TCO2: 20 mmol/L (ref 0–100)
pCO2 arterial: 27.4 mmHg — ABNORMAL LOW (ref 35.0–45.0)
pH, Arterial: 7.433 (ref 7.350–7.450)
pO2, Arterial: 126 mmHg — ABNORMAL HIGH (ref 80.0–100.0)
pO2, Arterial: 141 mmHg — ABNORMAL HIGH (ref 80.0–100.0)

## 2014-08-29 LAB — COMPREHENSIVE METABOLIC PANEL
ALBUMIN: 3 g/dL — AB (ref 3.5–5.2)
ALT: 39 U/L (ref 0–53)
ANION GAP: 15 (ref 5–15)
AST: 61 U/L — ABNORMAL HIGH (ref 0–37)
Alkaline Phosphatase: 106 U/L (ref 39–117)
BUN: 36 mg/dL — ABNORMAL HIGH (ref 6–23)
CO2: 17 mmol/L — ABNORMAL LOW (ref 19–32)
Calcium: 8.5 mg/dL (ref 8.4–10.5)
Chloride: 109 mmol/L (ref 96–112)
Creatinine, Ser: 1.65 mg/dL — ABNORMAL HIGH (ref 0.50–1.35)
GFR calc non Af Amer: 44 mL/min — ABNORMAL LOW (ref >90)
GFR, EST AFRICAN AMERICAN: 51 mL/min — AB (ref >90)
GLUCOSE: 203 mg/dL — AB (ref 70–99)
POTASSIUM: 4.9 mmol/L (ref 3.5–5.1)
SODIUM: 141 mmol/L (ref 135–145)
Total Bilirubin: 3.5 mg/dL — ABNORMAL HIGH (ref 0.3–1.2)
Total Protein: 6.7 g/dL (ref 6.0–8.3)

## 2014-08-29 LAB — GLUCOSE, CAPILLARY
GLUCOSE-CAPILLARY: 162 mg/dL — AB (ref 70–99)
Glucose-Capillary: 179 mg/dL — ABNORMAL HIGH (ref 70–99)

## 2014-08-29 LAB — SALICYLATE LEVEL: Salicylate Lvl: <4 mg/dL (ref 2.8–20.0)

## 2014-08-29 LAB — RAPID URINE DRUG SCREEN, HOSP PERFORMED
Amphetamines: NOT DETECTED
Barbiturates: NOT DETECTED
Benzodiazepines: POSITIVE — AB
Cocaine: POSITIVE — AB
Opiates: NOT DETECTED
Tetrahydrocannabinol: NOT DETECTED

## 2014-08-29 LAB — CK: Total CK: 172 U/L (ref 7–232)

## 2014-08-29 LAB — BASIC METABOLIC PANEL
Anion gap: 14 (ref 5–15)
BUN: 36 mg/dL — AB (ref 6–23)
CO2: 17 mmol/L — ABNORMAL LOW (ref 19–32)
CREATININE: 1.38 mg/dL — AB (ref 0.50–1.35)
Calcium: 8.3 mg/dL — ABNORMAL LOW (ref 8.4–10.5)
Chloride: 111 mmol/L (ref 96–112)
GFR calc Af Amer: 64 mL/min — ABNORMAL LOW (ref >90)
GFR calc non Af Amer: 55 mL/min — ABNORMAL LOW (ref >90)
GLUCOSE: 191 mg/dL — AB (ref 70–99)
POTASSIUM: 4.1 mmol/L (ref 3.5–5.1)
Sodium: 142 mmol/L (ref 135–145)

## 2014-08-29 LAB — I-STAT CG4 LACTIC ACID, ED: LACTIC ACID, VENOUS: 10.58 mmol/L — AB (ref 0.5–2.0)

## 2014-08-29 LAB — LACTIC ACID, PLASMA
LACTIC ACID, VENOUS: 7 mmol/L — AB (ref 0.5–2.0)
Lactic Acid, Venous: 3.7 mmol/L (ref 0.5–2.0)

## 2014-08-29 LAB — PROTIME-INR
INR: 1.48 (ref 0.00–1.49)
PROTHROMBIN TIME: 18.1 s — AB (ref 11.6–15.2)

## 2014-08-29 LAB — AMMONIA: Ammonia: 210 umol/L — ABNORMAL HIGH (ref 11–32)

## 2014-08-29 LAB — CBC
HCT: 27.2 % — ABNORMAL LOW (ref 39.0–52.0)
Hemoglobin: 9.4 g/dL — ABNORMAL LOW (ref 13.0–17.0)
MCH: 32.4 pg (ref 26.0–34.0)
MCHC: 34.6 g/dL (ref 30.0–36.0)
MCV: 93.8 fL (ref 78.0–100.0)
Platelets: 57 10*3/uL — ABNORMAL LOW (ref 150–400)
RBC: 2.9 MIL/uL — ABNORMAL LOW (ref 4.22–5.81)
RDW: 16.7 % — ABNORMAL HIGH (ref 11.5–15.5)
WBC: 4.6 10*3/uL (ref 4.0–10.5)

## 2014-08-29 LAB — ACETAMINOPHEN LEVEL: Acetaminophen (Tylenol), Serum: <10 ug/mL — ABNORMAL LOW (ref 10–30)

## 2014-08-29 LAB — POC OCCULT BLOOD, ED: Fecal Occult Bld: POSITIVE — AB

## 2014-08-29 LAB — LIPASE, BLOOD: Lipase: 35 U/L (ref 11–59)

## 2014-08-29 LAB — TROPONIN I: Troponin I: <0.03 ng/mL (ref <0.031)

## 2014-08-29 LAB — MRSA PCR SCREENING: MRSA by PCR: NEGATIVE

## 2014-08-29 LAB — CBG MONITORING, ED: GLUCOSE-CAPILLARY: 177 mg/dL — AB (ref 70–99)

## 2014-08-29 LAB — PROCALCITONIN: Procalcitonin: <0.1 ng/mL

## 2014-08-29 MED ORDER — CHLORHEXIDINE GLUCONATE 0.12 % MT SOLN
15.0000 mL | Freq: Two times a day (BID) | OROMUCOSAL | Status: DC
  Administered 2014-08-29 – 2014-08-31 (×4): 15 mL via OROMUCOSAL
  Filled 2014-08-29 (×3): qty 15

## 2014-08-29 MED ORDER — VECURONIUM BROMIDE 10 MG IV SOLR
10.0000 mg | Freq: Once | INTRAVENOUS | Status: AC
  Administered 2014-08-29: 10 mg via INTRAVENOUS

## 2014-08-29 MED ORDER — LIDOCAINE HCL (CARDIAC) 20 MG/ML IV SOLN
INTRAVENOUS | Status: AC | PRN
  Administered 2014-08-29: 100 mg via INTRAVENOUS

## 2014-08-29 MED ORDER — ETOMIDATE 2 MG/ML IV SOLN
INTRAVENOUS | Status: AC | PRN
Start: 2014-08-29 — End: 2014-08-29
  Administered 2014-08-29: 20 mg via INTRAVENOUS

## 2014-08-29 MED ORDER — OCTREOTIDE LOAD VIA INFUSION
50.0000 ug | Freq: Once | INTRAVENOUS | Status: DC
  Filled 2014-08-29: qty 25

## 2014-08-29 MED ORDER — RIFAXIMIN 550 MG PO TABS
550.0000 mg | ORAL_TABLET | Freq: Two times a day (BID) | ORAL | Status: DC
  Administered 2014-08-29 (×2): 550 mg
  Filled 2014-08-29 (×4): qty 1

## 2014-08-29 MED ORDER — DEXTROSE-NACL 5-0.45 % IV SOLN
INTRAVENOUS | Status: DC
  Administered 2014-08-29 – 2014-09-01 (×2): via INTRAVENOUS

## 2014-08-29 MED ORDER — SODIUM CHLORIDE 0.9 % IV SOLN
80.0000 mg | Freq: Once | INTRAVENOUS | Status: DC
  Administered 2014-08-29: 80 mg via INTRAVENOUS
  Filled 2014-08-29: qty 80

## 2014-08-29 MED ORDER — CETYLPYRIDINIUM CHLORIDE 0.05 % MT LIQD
7.0000 mL | Freq: Four times a day (QID) | OROMUCOSAL | Status: DC
  Administered 2014-08-29 – 2014-08-31 (×7): 7 mL via OROMUCOSAL

## 2014-08-29 MED ORDER — SODIUM CHLORIDE 0.9 % IV SOLN: 250.0000 mL | INTRAVENOUS | Status: DC | PRN

## 2014-08-29 MED ORDER — INSULIN ASPART 100 UNIT/ML ~~LOC~~ SOLN
0.0000 [IU] | SUBCUTANEOUS | Status: DC
  Administered 2014-08-29 (×2): 2 [IU] via SUBCUTANEOUS
  Administered 2014-08-30 (×3): 1 [IU] via SUBCUTANEOUS
  Administered 2014-08-31: 2 [IU] via SUBCUTANEOUS
  Administered 2014-08-31: 3 [IU] via SUBCUTANEOUS
  Administered 2014-09-01 (×2): 1 [IU] via SUBCUTANEOUS

## 2014-08-29 MED ORDER — SODIUM CHLORIDE 0.9 % IV SOLN
50.0000 ug/h | INTRAVENOUS | Status: DC
  Filled 2014-08-29 (×2): qty 1

## 2014-08-29 MED ORDER — STERILE WATER FOR INJECTION IJ SOLN
INTRAMUSCULAR | Status: AC
  Administered 2014-08-29: 10 mL
  Filled 2014-08-29: qty 10

## 2014-08-29 MED ORDER — SODIUM CHLORIDE 0.9 % IV BOLUS (SEPSIS)
2000.0000 mL | Freq: Once | INTRAVENOUS | Status: AC
  Administered 2014-08-29: 2000 mL via INTRAVENOUS

## 2014-08-29 MED ORDER — VECURONIUM BROMIDE 10 MG IV SOLR
INTRAVENOUS | Status: AC
  Filled 2014-08-29: qty 10

## 2014-08-29 MED ORDER — LACTULOSE 10 GM/15ML PO SOLN
30.0000 g | Freq: Three times a day (TID) | ORAL | Status: DC
  Administered 2014-08-29 (×2): 30 g
  Filled 2014-08-29 (×5): qty 45

## 2014-08-29 MED ORDER — PANTOPRAZOLE SODIUM 40 MG IV SOLR
40.0000 mg | Freq: Two times a day (BID) | INTRAVENOUS | Status: DC
  Administered 2014-08-29 (×2): 40 mg via INTRAVENOUS
  Filled 2014-08-29 (×4): qty 40

## 2014-08-29 MED ORDER — FENTANYL CITRATE 0.05 MG/ML IJ SOLN
100.0000 ug | INTRAMUSCULAR | Status: DC | PRN
  Administered 2014-08-29 – 2014-08-30 (×4): 100 ug via INTRAVENOUS
  Filled 2014-08-29 (×4): qty 2

## 2014-08-29 MED ORDER — PROPOFOL 10 MG/ML IV EMUL
5.0000 ug/kg/min | Freq: Once | INTRAVENOUS | Status: AC
  Administered 2014-08-29: 5.051 ug/kg/min via INTRAVENOUS

## 2014-08-29 MED ORDER — FENTANYL CITRATE 0.05 MG/ML IJ SOLN
100.0000 ug | INTRAMUSCULAR | Status: DC | PRN
  Administered 2014-08-30: 100 ug via INTRAVENOUS
  Filled 2014-08-29: qty 2

## 2014-08-29 MED ORDER — SUCCINYLCHOLINE CHLORIDE 20 MG/ML IJ SOLN
INTRAMUSCULAR | Status: AC | PRN
  Administered 2014-08-29: 150 mg via INTRAVENOUS

## 2014-08-29 MED ORDER — PROPOFOL 10 MG/ML IV EMUL
INTRAVENOUS | Status: AC
  Filled 2014-08-29: qty 100

## 2014-08-29 MED ORDER — SODIUM CHLORIDE 0.9 % IV SOLN
8.0000 mg/h | INTRAVENOUS | Status: DC
  Administered 2014-08-29: 8 mg/h via INTRAVENOUS
  Filled 2014-08-29 (×2): qty 80

## 2014-08-29 NOTE — Sedation Documentation (Signed)
MD at bedside to intubate 

## 2014-08-29 NOTE — ED Notes (Signed)
Critical care at bedside  

## 2014-08-29 NOTE — Procedures (Signed)
Intubation Procedure Note [name] [id number] 01-16-1956  Procedure: Intubation Indications: Airway protection and maintenance  Procedure Details Consent: Unable to obtain consent because of emergent medical necessity. Time Out: Verified patient identification, verified procedure, site/side was marked, verified correct patient position, special equipment/implants available, medications/allergies/relevent history reviewed, required imaging and test results available.  Performed  Maximum sterile technique was used including cap, gown, hand hygiene and mask.  MAC and 4    Evaluation Hemodynamic Status: BP stable throughout; O2 sats: stable throughout Patient's Current Condition: stable Complications: No apparent complications Patient did tolerate procedure well. Chest X-ray ordered to verify placement.  CXR: pending.   Chipper Herb 08/29/2014

## 2014-08-29 NOTE — ED Notes (Signed)
Transporting pt to CT.

## 2014-08-29 NOTE — ED Notes (Signed)
Portable xray at bedside.

## 2014-08-29 NOTE — ED Provider Notes (Signed)
CSN: [id number]     Arrival date & time 08/29/14  1103 History   First MD Initiated Contact with Patient 08/29/14 1110     No chief complaint on file.     HPI  Patient presents via EMS unresponsive.  Patient has visiting home nurse. She arrived at his house today. Found him on the floor in the bedroom several pieces of knocked over furniture he was on the floor supine had urinated on himself. No vomitus. Unresponsive but breathing. EMS was summoned. They arrived to find him as above. Breathing but unresponsive. Normal sugar. Stable vitals en route. "Clenching his teeth" but no documented seizure activity.  If given history for hepatic encephalopathy drug use diabetes, neuropathy, narcotic addiction, alcohol abuse,  cocaine abuse.  Past Medical History  Diagnosis Date  . Neuropathy   . Diabetes mellitus   . Bipolar affect, depressed   . Hypertension   . Arthritis   . Stroke     Mini stroke about 29yrs ago  . Cirrhosis   . Alcohol abuse   . Chronic pain   . Cocaine abuse   . Muscle spasm     both legs  . Encephalopathy, hepatic   . Detached retina   . COPD (chronic obstructive pulmonary disease)     emphysema  . Bronchitis   . Barrett's esophagus   . GERD (gastroesophageal reflux disease)     has ulcer  . Anemia    Past Surgical History  Procedure Laterality Date  . Fracture surgery      Leg and arm 42yrs ago  . Esophagogastroduodenoscopy  04/04/2012    Procedure: ESOPHAGOGASTRODUODENOSCOPY (EGD);  Surgeon: Irene Shipper, MD;  Location: [facility] ENDOSCOPY;  Service: Endoscopy;  Laterality: N/A;  . Esophagogastroduodenoscopy Left 03/13/2013    Procedure: ESOPHAGOGASTRODUODENOSCOPY (EGD);  Surgeon: Arta Silence, MD;  Location: [facility] ENDOSCOPY;  Service: Endoscopy;  Laterality: Left;  Marland Kitchen Eye surgery  8 months ago both eyes    cataracts both eyes, detached eye, gas pocket  . Vasectomy    . Pars plana vitrectomy Left 07/08/2013    Procedure: PARS PLANA VITRECTOMY WITH 25 GAUGE;   Surgeon: Hurman Horn, MD;  Location: [place];  Service: Ophthalmology;  Laterality: Left;  . Intraocular lens removal Left 07/08/2013    Procedure: REMOVAL OF INTRAOCULAR LENS;  Surgeon: Hurman Horn, MD;  Location: [place];  Service: Ophthalmology;  Laterality: Left;  . Placement and suture of secondary intraocular lens Left 07/08/2013    Procedure: PLACEMENT AND SUTURE OF SECONDARY INTRAOCULAR LENS;  Surgeon: Hurman Horn, MD;  Location: [place];  Service: Ophthalmology;  Laterality: Left;  Insertion of Anterior Capsule Intraocular Lens   . Esophagogastroduodenoscopy N/A 01/16/2014    Procedure: ESOPHAGOGASTRODUODENOSCOPY (EGD);  Surgeon: Winfield Cunas., MD;  Location: [facility] ENDOSCOPY;  Service: Endoscopy;  Laterality: N/A;  . Colonoscopy N/A 01/17/2014    Procedure: COLONOSCOPY;  Surgeon: Winfield Cunas., MD;  Location: [facility] ENDOSCOPY;  Service: Endoscopy;  Laterality: N/A;  possible banding   Family History  Problem Relation Age of Onset  . Hypotension Mother    History  Substance Use Topics  . Smoking status: Current Every Day Smoker -- 1.00 packs/day for 30 years    Types: Cigarettes    Last Attempt to Quit: 04/06/2012  . Smokeless tobacco: Never Used     Comment: quit   . Alcohol Use: 0.0 oz/week     Comment: 12 pk beer daily  06/2013 - no alcohol  since 11/2012    Review of Systems  Unable to perform ROS: Mental status change      Allergies  Review of patient's allergies indicates no known allergies.  Home Medications   Prior to Admission medications   Medication Sig Start Date End Date Taking? Authorizing Provider  amitriptyline (ELAVIL) 25 MG tablet Take 25 mg by mouth at bedtime.   Yes Historical Provider, MD  clonazePAM (KLONOPIN) 0.5 MG tablet Take 0.5 mg by mouth 2 (two) times daily. 08/04/14  Yes Historical Provider, MD  diazepam (VALIUM) 2 MG tablet Take 2 mg by mouth 3 (three) times daily. 08/18/14  Yes Historical Provider, MD  diazepam (VALIUM) 5 MG tablet Take 5  mg by mouth 3 (three) times daily. 08/01/14  Yes Historical Provider, MD  diclofenac sodium (VOLTAREN) 1 % GEL Apply 4 g topically as needed (for back of leg pain).   Yes Historical Provider, MD  furosemide (LASIX) 40 MG tablet Take 40 mg by mouth daily. 08/18/14  Yes Historical Provider, MD  gabapentin (NEURONTIN) 600 MG tablet Take 0.5 tablets (300 mg total) by mouth 3 (three) times daily. 08/18/14  Yes Bonnielee Haff, MD  ibuprofen (ADVIL,MOTRIN) 800 MG tablet Take 800 mg by mouth every 8 (eight) hours as needed.   Yes Historical Provider, MD  insulin aspart (NOVOLOG FLEXPEN) 100 UNIT/ML FlexPen Inject 6 Units into the skin 3 (three) times daily with meals. For diabetes 02/19/14  Yes Encarnacion Slates, NP  Insulin Glargine (LANTUS SOLOSTAR) 100 UNIT/ML Solostar Pen Inject 20 units into the skin at bedtime: For diabetes 08/18/14  Yes Bonnielee Haff, MD  lactulose (CHRONULAC) 10 GM/15ML solution Take 45 mLs (30 g total) by mouth 3 (three) times daily. Patient taking differently: Take 30 g by mouth daily. Take every day per spouse 08/01/14  Yes Jessica U Vann, DO  lisinopril (PRINIVIL,ZESTRIL) 20 MG tablet Take 20 mg by mouth daily.   Yes Historical Provider, MD  Multiple Vitamin (MULTIVITAMIN WITH MINERALS) TABS tablet Take 1 tablet by mouth daily. 07/31/14  Yes Geradine Girt, DO  oxyCODONE (OXY IR/ROXICODONE) 5 MG immediate release tablet Take 1 tablet (5 mg total) by mouth every 6 (six) hours as needed for moderate pain. 08/18/14  Yes Bonnielee Haff, MD  Oxycodone HCl 10 MG TABS Take 10 mg by mouth 3 (three) times daily. 08/04/14  Yes Historical Provider, MD  pantoprazole (PROTONIX) 40 MG tablet Take 40 mg by mouth 2 (two) times daily. 07/09/14  Yes Historical Provider, MD   BP 145/65 mmHg  Pulse 103  Temp(Src) 95.5 F (35.3 C) (Rectal)  Resp 22  Wt 211 lb (95.709 kg)  SpO2 98% Physical Exam  Constitutional:  Supine. Not verbally responsive.  HENT:  No signs of trauma the head. Conjunctiva not pale.  No scleral icterus. Teeth clenched. No bite of the tongue.  Eyes:  Roving eye movements. Disconjugate gaze. 3 mm reactive pupils.  Neck:  Neck place a collar on arrival. No JVD.  Cardiovascular:  Tachycardic. Sinus.  Pulmonary/Chest:  Cheyne-Stokes respirations. Deep breath, small breath, pause, cycle repeats.  Abdominal:  Soft benign. Brown stool. No blood on rectal.  Neurological:  GC S4. The cerebral posturing with the upper extremities to noxious stimulation. No movement of the lower extremities.  Skin:  No decubiti or skin breakdown.    ED Course  Procedures (including critical care time) Labs Review Labs Reviewed  CBC WITH DIFFERENTIAL/PLATELET - Abnormal; Notable for the following:    RBC 3.46 (*)  Hemoglobin 11.1 (*)    HCT 33.1 (*)    RDW 16.9 (*)    Platelets 72 (*)    Neutrophils Relative % 84 (*)    Lymphocytes Relative 9 (*)    Lymphs Abs 0.4 (*)    All other components within normal limits  COMPREHENSIVE METABOLIC PANEL - Abnormal; Notable for the following:    CO2 17 (*)    Glucose, Bld 203 (*)    BUN 36 (*)    Creatinine, Ser 1.65 (*)    Albumin 3.0 (*)    AST 61 (*)    Total Bilirubin 3.5 (*)    GFR calc non Af Amer 44 (*)    GFR calc Af Amer 51 (*)    All other components within normal limits  PROTIME-INR - Abnormal; Notable for the following:    Prothrombin Time 18.1 (*)    All other components within normal limits  URINE RAPID DRUG SCREEN (HOSP PERFORMED) - Abnormal; Notable for the following:    Cocaine POSITIVE (*)    Benzodiazepines POSITIVE (*)    All other components within normal limits  ACETAMINOPHEN LEVEL - Abnormal; Notable for the following:    Acetaminophen (Tylenol), Serum <10.0 (*)    All other components within normal limits  AMMONIA - Abnormal; Notable for the following:    Ammonia 210 (*)    All other components within normal limits  BASIC METABOLIC PANEL - Abnormal; Notable for the following:    CO2 17 (*)    Glucose, Bld  191 (*)    BUN 36 (*)    Creatinine, Ser 1.38 (*)    Calcium 8.3 (*)    GFR calc non Af Amer 55 (*)    GFR calc Af Amer 64 (*)    All other components within normal limits  LACTIC ACID, PLASMA - Abnormal; Notable for the following:    Lactic Acid, Venous 7.0 (*)    All other components within normal limits  CBC - Abnormal; Notable for the following:    RBC 2.90 (*)    Hemoglobin 9.4 (*)    HCT 27.2 (*)    RDW 16.7 (*)    Platelets 57 (*)    All other components within normal limits  I-STAT CG4 LACTIC ACID, ED - Abnormal; Notable for the following:    Lactic Acid, Venous 10.58 (*)    All other components within normal limits  CBG MONITORING, ED - Abnormal; Notable for the following:    Glucose-Capillary 177 (*)    All other components within normal limits  I-STAT ARTERIAL BLOOD GAS, ED - Abnormal; Notable for the following:    pCO2 arterial 27.4 (*)    pO2, Arterial 141.0 (*)    Bicarbonate 17.3 (*)    Acid-base deficit 6.0 (*)    All other components within normal limits  I-STAT ARTERIAL BLOOD GAS, ED - Abnormal; Notable for the following:    pCO2 arterial 28.2 (*)    pO2, Arterial 126.0 (*)    Bicarbonate 18.9 (*)    Acid-base deficit 5.0 (*)    All other components within normal limits  POC OCCULT BLOOD, ED - Abnormal; Notable for the following:    Fecal Occult Bld POSITIVE (*)    All other components within normal limits  CULTURE, BLOOD (ROUTINE X 2)  CULTURE, BLOOD (ROUTINE X 2)  URINE CULTURE  CULTURE, RESPIRATORY (NON-EXPECTORATED)  MRSA PCR SCREENING  TROPONIN I  SALICYLATE LEVEL  CK  LIPASE, BLOOD  PROCALCITONIN  LACTIC ACID, PLASMA  TYPE AND SCREEN    Imaging Review Ct Head Wo Contrast  08/29/2014   CLINICAL DATA:  Patient found unresponsive on the floor today.  EXAM: CT HEAD WITHOUT CONTRAST  CT CERVICAL SPINE WITHOUT CONTRAST  TECHNIQUE: Multidetector CT imaging of the head and cervical spine was performed following the standard protocol without  intravenous contrast. Multiplanar CT image reconstructions of the cervical spine were also generated.  COMPARISON:  Head CT scan 08/14/2014. Head and cervical spine CT scan 01/03/2004.  FINDINGS: CT HEAD FINDINGS  Cortical atrophy and chronic microvascular ischemic change are again seen. No evidence of acute intracranial abnormality including hemorrhage, infarct, mass lesion, mass effect, midline shift abnormal extra-axial fluid collection is identified. There is no hydrocephalus or pneumocephalus. Small left mastoid effusion is unchanged.  CT CERVICAL SPINE FINDINGS  There is no fracture or malalignment of the cervical spine. Intervertebral disc space height is maintained. Endotracheal tube and NG tube are in place. Lung apices are clear. Paraspinous soft tissue structures are unremarkable. Carotid atherosclerosis is noted.  IMPRESSION: No acute finding head or cervical spine.  Atrophy and chronic microvascular ischemic change.   Electronically Signed   By: Inge Rise M.D.   On: 08/29/2014 12:23   Ct Cervical Spine Wo Contrast  08/29/2014   CLINICAL DATA:  Patient found unresponsive on the floor today.  EXAM: CT HEAD WITHOUT CONTRAST  CT CERVICAL SPINE WITHOUT CONTRAST  TECHNIQUE: Multidetector CT imaging of the head and cervical spine was performed following the standard protocol without intravenous contrast. Multiplanar CT image reconstructions of the cervical spine were also generated.  COMPARISON:  Head CT scan 08/14/2014. Head and cervical spine CT scan 01/03/2004.  FINDINGS: CT HEAD FINDINGS  Cortical atrophy and chronic microvascular ischemic change are again seen. No evidence of acute intracranial abnormality including hemorrhage, infarct, mass lesion, mass effect, midline shift abnormal extra-axial fluid collection is identified. There is no hydrocephalus or pneumocephalus. Small left mastoid effusion is unchanged.  CT CERVICAL SPINE FINDINGS  There is no fracture or malalignment of the cervical  spine. Intervertebral disc space height is maintained. Endotracheal tube and NG tube are in place. Lung apices are clear. Paraspinous soft tissue structures are unremarkable. Carotid atherosclerosis is noted.  IMPRESSION: No acute finding head or cervical spine.  Atrophy and chronic microvascular ischemic change.   Electronically Signed   By: Inge Rise M.D.   On: 08/29/2014 12:23   Dg Chest Port 1 View  08/29/2014   CLINICAL DATA:  Respiratory failure.  EXAM: PORTABLE CHEST - 1 VIEW  COMPARISON:  08/29/2014 at 11:28 a.m.  FINDINGS: Endotracheal tube with tip 4.2 cm above the carina. Enteric tube courses into the region of the stomach and off the inferior portion of the film. Interval placement of left-sided catheter over the left neck extending inferiorly following a course more consistent with arterial location left of midline below the level of the carina possibly within the descending thoracic aorta or proximal ascending thoracic aorta.  Lungs are hypoinflated. No evidence of left pneumothorax. Mild prominence of the perihilar markings suggesting minimal vascular congestion. Cardiomediastinal silhouette and remainder of the exam is unchanged.  IMPRESSION: Hypoinflation with suggestion of mild vascular congestion.  Tubes and lines as described. Note that the left sided vascular catheter appears to take a course more consistent with arterial location possibly within the proximal ascending aorta or descending aorta. No pneumothorax.  Critical Value/emergent results were called by telephone at the time of interpretation  on 08/29/2014 at 2:06 pm to Dr. Tanna Furry, who verbally acknowledged these results.   Electronically Signed   By: Marin Olp M.D.   On: 08/29/2014 14:06   Dg Chest Port 1 View  08/29/2014   CLINICAL DATA:  Unresponsive.  EXAM: PORTABLE CHEST - 1 VIEW  COMPARISON:  Radiograph 08/15/2014  FINDINGS: Endotracheal tube approximately 5.8 cm from carina. NG tube in stomach. No effusion,  infiltrate, pneumothorax. Low lung volumes.  IMPRESSION: Endotracheal tube in appropriate position.  Low lung volumes.   Electronically Signed   By: Suzy Bouchard M.D.   On: 08/29/2014 11:41     EKG Interpretation None      INTUBATION Performed by: Lolita Patella  Required items: required blood products, implants, devices, and special equipment available Patient identity confirmed: provided demographic data and hospital-assigned identification number Time out: Immediately prior to procedure a "time out" was called to verify the correct patient, procedure, equipment, support staff and site/side marked as required.  Indications: unresponsive, decerebrate  Intubation method: Glidescope Laryngoscopy   Preoxygenation: BVM Pre treatment:  Lidocaine 100mg  Sedatives: 20 mg Etomidate Paralytic: 150Succinylcholine  Tube Size: 8-0 cuffed  Post-procedure assessment: chest rise and ETCO2 monitor Breath sounds: equal and absent over the epigastrium Tube secured with: ETT holder Chest x-ray interpreted by radiologist and me.  Chest x-ray findings: endotracheal tube in appropriate position  Patient tolerated the procedure well with no immediate complications.     MDM   Final diagnoses:  Unresponsive    Centigrade. After studies completed I discussed the case with critical care. Patient has markedly elevated ammonia. Metabolic acidosis. Was given multiple fluid boluses. Has good urine output. X-ray shows no acute abnormalities, proper positioning of the endotracheal and NG tube. Foley draining clear urine.  CRITICAL CARE Performed by: Tanna Furry JOSEPH   Total critical care time: 60 min  Critical care time was exclusive of separately billable procedures and treating other patients.  Critical care was necessary to treat or prevent imminent or life-threatening deterioration.  Critical care was time spent personally by me on the following activities: development of  treatment plan with patient and/or surrogate as well as nursing, discussions with consultants, evaluation of patient's response to treatment, examination of patient, obtaining history from patient or surrogate, ordering and performing treatments and interventions, ordering and review of laboratory studies, ordering and review of radiographic studies, pulse oximetry and re-evaluation of patient's condition. care    Tanna Furry, MD 08/29/14 1640

## 2014-08-29 NOTE — ED Notes (Signed)
Pt found unresponsive on floor by home health RN. TV and desk knocked over. Pt was incontinent, decorb posturing, pt eyes darting back and forth. EMS gave 2.5 mg versed. Lungs have ronchi. 100% on nonrebreather, CBG 196, BP 180/100.

## 2014-08-29 NOTE — Progress Notes (Addendum)
INITIAL NUTRITION ASSESSMENT  DOCUMENTATION CODES Per approved criteria  -Obesity Unspecified   INTERVENTION:  If unable to extubate patient within the next 24 hours, recommend initiate TF via OGT with Vital High Protein at 25 ml/h and Prostat 60 ml TID on day 1; on day 2, increase to goal rate of 30 ml/h (720 ml per day) to provide 1320 kcals (14 kcals/kg actual weight and 66% of estimated needs), 153 gm protein, 602 ml free water daily.  NUTRITION DIAGNOSIS: Inadequate oral intake related to inability to eat as evidenced by NPO status.  Goal: Enteral nutrition to provide 65-70% of estimated calorie needs based on ASPEN guidelines for hypocaloric, high protein feeding in critically ill obese individuals.  Monitor:  TF tolerance/adequacy, weight trend, labs, vent status.  Reason for Assessment: VDRF  58 y.o. male  Admitting Dx: AMS  ASSESSMENT: Patient admitted on 4/8 after being found unresponsive at home on the floor by home health nurse. Ammonia 210. Required intubation in the ED. History of smoking cigarettes, cocaine abuse, and ETOH abuse.  Patient with coffee ground aspirate initially after intubation, ? GIB. History of alcoholic cirrhosis.  Patient is currently intubated on ventilator support MV: 10.4 L/min Temp (24hrs), Avg:93.9 F (34.4 C), Min:92.1 F (33.4 C), Max:95.5 F (35.3 C)  Propofol: off   Height: Ht Readings from Last 1 Encounters:  08/17/14 5\' 10"  (1.778 m)    Weight: Wt Readings from Last 1 Encounters:  08/29/14 211 lb (95.709 kg)    Ideal Body Weight: 75.5 kg  % Ideal Body Weight: 127%  Wt Readings from Last 10 Encounters:  08/29/14 211 lb (95.709 kg)  08/17/14 211 lb (95.709 kg)  07/31/14 220 lb 4.8 oz (99.927 kg)  05/26/14 211 lb (95.709 kg)  03/30/14 215 lb (97.523 kg)  02/14/14 230 lb (104.327 kg)  02/12/14 230 lb (104.327 kg)  02/08/14 230 lb (104.327 kg)  02/05/14 206 lb (93.441 kg)  02/02/14 219 lb (99.338 kg)    Usual  Body Weight: 206-230 lbs  % Usual Body Weight: 100%  BMI:  Body mass index is 30.28 kg/(m^2).  Estimated Nutritional Needs: Kcal: 2005 Protein: > 150 gm Fluid: 2 L  Skin: stage 2 pressure ulcer to buttocks (documented on previous admission in March 2016)  Diet Order: Diet NPO time specified  EDUCATION NEEDS: -Education not appropriate at this time   Intake/Output Summary (Last 24 hours) at 08/29/14 1535 Last data filed at 08/29/14 1414  Gross per 24 hour  Intake   3000 ml  Output    500 ml  Net   2500 ml    Last BM: unknown   Labs:   Recent Labs Lab 08/29/14 1126 08/29/14 1349  NA 141 142  K 4.9 4.1  CL 109 111  CO2 17* 17*  BUN 36* 36*  CREATININE 1.65* 1.38*  CALCIUM 8.5 8.3*  GLUCOSE 203* 191*    CBG (last 3)   Recent Labs  08/29/14 1112  GLUCAP 177*    Scheduled Meds: . antiseptic oral rinse  7 mL Mouth Rinse QID  . chlorhexidine  15 mL Mouth Rinse BID  . insulin aspart  0-9 Units Subcutaneous 6 times per day  . lactulose  30 g Per Tube TID  . pantoprazole (PROTONIX) IV  40 mg Intravenous Q12H  . rifaximin  550 mg Per Tube BID    Continuous Infusions: . dextrose 5 % and 0.45% NaCl      Past Medical History  Diagnosis Date  .  Neuropathy   . Diabetes mellitus   . Bipolar affect, depressed   . Hypertension   . Arthritis   . Stroke     Mini stroke about 50yrs ago  . Cirrhosis   . Alcohol abuse   . Chronic pain   . Cocaine abuse   . Muscle spasm     both legs  . Encephalopathy, hepatic   . Detached retina   . COPD (chronic obstructive pulmonary disease)     emphysema  . Bronchitis   . Barrett's esophagus   . GERD (gastroesophageal reflux disease)     has ulcer  . Anemia     Past Surgical History  Procedure Laterality Date  . Fracture surgery      Leg and arm 51yrs ago  . Esophagogastroduodenoscopy  04/04/2012    Procedure: ESOPHAGOGASTRODUODENOSCOPY (EGD);  Surgeon: Irene Shipper, MD;  Location[facility] ENDOSCOPY;  Service:  Endoscopy;  Laterality: N/A;  . Esophagogastroduodenoscopy Left 03/13/2013    Procedure: ESOPHAGOGASTRODUODENOSCOPY (EGD);  Surgeon: Arta Silence, MD;  Location: [facility] ENDOSCOPY;  Service: Endoscopy;  Laterality: Left;  Marland Kitchen Eye surgery  8 months ago both eyes    cataracts both eyes, detached eye, gas pocket  . Vasectomy    . Pars plana vitrectomy Left 07/08/2013    Procedure: PARS PLANA VITRECTOMY WITH 25 GAUGE;  Surgeon: Hurman Horn, MD;  Location: [place];  Service: Ophthalmology;  Laterality: Left;  . Intraocular lens removal Left 07/08/2013    Procedure: REMOVAL OF INTRAOCULAR LENS;  Surgeon: Hurman Horn, MD;  Location: [place];  Service: Ophthalmology;  Laterality: Left;  . Placement and suture of secondary intraocular lens Left 07/08/2013    Procedure: PLACEMENT AND SUTURE OF SECONDARY INTRAOCULAR LENS;  Surgeon: Hurman Horn, MD;  Location: [place];  Service: Ophthalmology;  Laterality: Left;  Insertion of Anterior Capsule Intraocular Lens   . Esophagogastroduodenoscopy N/A 01/16/2014    Procedure: ESOPHAGOGASTRODUODENOSCOPY (EGD);  Surgeon: Winfield Cunas., MD;  Location: [facility] ENDOSCOPY;  Service: Endoscopy;  Laterality: N/A;  . Colonoscopy N/A 01/17/2014    Procedure: COLONOSCOPY;  Surgeon: Winfield Cunas., MD;  Location: [facility] ENDOSCOPY;  Service: Endoscopy;  Laterality: N/A;  possible banding    Molli Barrows, RD, LDN, [place] Pager [phone] After Hours Pager [phone]

## 2014-08-29 NOTE — ED Notes (Signed)
Attempted report x1. 

## 2014-08-29 NOTE — ED Notes (Signed)
NP removed central line. No fluids were given through the central line

## 2014-08-29 NOTE — ED Notes (Signed)
Lab results reported to Dr.James. 

## 2014-08-29 NOTE — Procedures (Signed)
Central Venous Catheter Insertion Procedure Note [name] [id number] Jun 13, 1955  Procedure: Insertion of Central Venous Catheter Indications: Drug and/or fluid administration  Procedure Details Consent: Unable to obtain consent because of altered level of consciousness. Time Out: Verified patient identification, verified procedure, site/side was marked, verified correct patient position, special equipment/implants available, medications/allergies/relevent history reviewed, required imaging and test results available.  Performed Real time [place] was used to ID and cannulate.   Maximum sterile technique was used including antiseptics, cap, gloves, gown, hand hygiene, mask and sheet. Skin prep: Chlorhexidine; local anesthetic administered. A antimicrobial bonded/coated triple lumen catheter was placed in the left internal jugular vein using the Seldinger technique.  Evaluation Blood flow good Complications: none Patient did tolerate procedure well. Chest X-ray ordered to verify placement.  CXR: Line in artery. By [place] it appears as though the CVL went thru the vein and into the artery. Removed, pressure held to site. No hematoma observed.   [name],[name] 08/29/2014, 2:46 PM  I was present and supervised procedure.  Carotid insertion, line removed, pressure held, no hematoma and no foreseen complications noted.  Rush Farmer, M.D. [facility] Pulmonary/Critical Care Medicine. Pager: [phone]. After hours pager: [phone].

## 2014-08-29 NOTE — H&P (Signed)
PULMONARY / CRITICAL CARE MEDICINE   Name: [name] MRN: [id number] DOB: Mar 06, 1956    ADMISSION DATE:  08/29/2014 CONSULTATION DATE:  08/29/2014  REFERRING MD :Jeneen Rinks  CHIEF COMPLAINT:  Altered mental status  INITIAL PRESENTATION:  59 year-old male with known history of alcoholic cirrhosis and noncompliance with lactulose found unresponsive at home on floor 4/8 by home health nurse with TV and desk knocked over. Patient was incontinent, posturing, with erratic eye movements. Intubated in ED. Ammonia level was  210. PCCM to admit.   STUDIES:  4/8 cervical spine - no acute finding  4/8 head CT - no acute finding    SIGNIFICANT EVENTS: 4/8 - intubated    HISTORY OF PRESENT ILLNESS:   60 year-old male with PMH as below, which includes alcoholic cirrhosis, HTN, CVA, COPD, DM, and cocaine abuse found unresponsive at home on floor by home health nurse with TV and desk knocked over. Patient was incontinent, posturing, with erratic eye movements. Glucose 196 and BP 180/100. Rigidity noted on admission to ED per nurse prior to RSI meds given for intubation. Ammonia 210. Hypothermic to 33.3 on arrival to ED. Tox screen positive for cocaine benzodiazepines. +coffee ground aspirate from OG tube. Multiple previous admissions for AMS r/t noncompliance with lactulose and/or rifaximin, most recently discharged 3/24 for same.    PAST MEDICAL HISTORY :   has a past medical history of Neuropathy; Diabetes mellitus; Bipolar affect, depressed; Hypertension; Arthritis; Stroke; Cirrhosis; Alcohol abuse; Chronic pain; Cocaine abuse; Muscle spasm; Encephalopathy, hepatic; Detached retina; COPD (chronic obstructive pulmonary disease); Bronchitis; Barrett's esophagus; GERD (gastroesophageal reflux disease); and Anemia.  has past surgical history that includes Fracture surgery; Esophagogastroduodenoscopy (04/04/2012); Esophagogastroduodenoscopy (Left, 03/13/2013); Eye surgery (8 months ago both eyes); Vasectomy;  Pars plana vitrectomy (Left, 07/08/2013); Intraocular lens removal (Left, 07/08/2013); Placement and suture of secondary intraoculaer lens (Left, 07/08/2013); Esophagogastroduodenoscopy (N/A, 01/16/2014); and Colonoscopy (N/A, 01/17/2014). Prior to Admission medications   Medication Sig Start Date End Date Taking? Authorizing Provider  diclofenac sodium (VOLTAREN) 1 % GEL Apply 4 g topically as needed (for back of leg pain).    Historical Provider, MD  folic acid (FOLVITE) 1 MG tablet Take 1 tablet (1 mg total) by mouth daily. Patient not taking: Reported on 08/15/2014 07/31/14   Geradine Girt, DO  furosemide (LASIX) 20 MG tablet Take 1 tablet (20 mg total) by mouth 2 (two) times daily. 08/18/14   Bonnielee Haff, MD  gabapentin (NEURONTIN) 600 MG tablet Take 0.5 tablets (300 mg total) by mouth 3 (three) times daily. 08/18/14   Bonnielee Haff, MD  ibuprofen (ADVIL,MOTRIN) 800 MG tablet Take 800 mg by mouth every 8 (eight) hours as needed.    Historical Provider, MD  insulin aspart (NOVOLOG FLEXPEN) 100 UNIT/ML FlexPen Inject 6 Units into the skin 3 (three) times daily with meals. For diabetes 02/19/14   Encarnacion Slates, NP  Insulin Glargine (LANTUS SOLOSTAR) 100 UNIT/ML Solostar Pen Inject 20 units into the skin at bedtime: For diabetes 08/18/14   Bonnielee Haff, MD  lactulose (CHRONULAC) 10 GM/15ML solution Take 45 mLs (30 g total) by mouth 3 (three) times daily. Patient taking differently: Take 30 g by mouth daily. Take every day per spouse 08/01/14   Geradine Girt, DO  lisinopril (PRINIVIL,ZESTRIL) 20 MG tablet Take 20 mg by mouth daily.    Historical Provider, MD  Multiple Vitamin (MULTIVITAMIN WITH MINERALS) TABS tablet Take 1 tablet by mouth daily. 07/31/14   Geradine Girt, DO  oxyCODONE (OXY  IR/ROXICODONE) 5 MG immediate release tablet Take 1 tablet (5 mg total) by mouth every 6 (six) hours as needed for moderate pain. 08/18/14   Bonnielee Haff, MD  pantoprazole (PROTONIX) 40 MG tablet Take 40 mg by mouth 2  (two) times daily. 07/09/14   Historical Provider, MD  rifaximin (XIFAXAN) 550 MG TABS tablet Take 1 tablet (550 mg total) by mouth 2 (two) times daily. For IBS 02/19/14   Encarnacion Slates, NP  spironolactone (ALDACTONE) 100 MG tablet Take 1 tablet (100 mg total) by mouth daily. Patient not taking: Reported on 08/15/2014 07/31/14   Geradine Girt, DO  thiamine 100 MG tablet Take 1 tablet (100 mg total) by mouth daily. Patient not taking: Reported on 08/15/2014 07/31/14   Geradine Girt, DO   No Known Allergies  FAMILY HISTORY:  indicated that his mother is alive. He indicated that his father is deceased. He indicated that his maternal grandmother is deceased. He indicated that his maternal grandfather is deceased. He indicated that his paternal grandmother is deceased. He indicated that his paternal grandfather is deceased.  SOCIAL HISTORY:  reports that he has been smoking Cigarettes.  He has a 30 pack-year smoking history. He has never used smokeless tobacco. He reports that he drinks alcohol. He reports that he uses illicit drugs (Cocaine).  REVIEW OF SYSTEMS:  UTA   SUBJECTIVE: sedated, hypothermic   VITAL SIGNS: Temp:  [92.1 F (33.4 C)-95.3 F (35.2 C)] 95.3 F (35.2 C) (04/08 1500) Pulse Rate:  [64-95] 89 (04/08 1430) Resp:  [16-20] 18 (04/08 1430) BP: (118-189)/(62-81) 148/72 mmHg (04/08 1430) SpO2:  [97 %-100 %] 97 % (04/08 1430) FiO2 (%):  [50 %-100 %] 50 % (04/08 1131) Weight:  [95.709 kg (211 lb)] 95.709 kg (211 lb) (04/08 1209) HEMODYNAMICS:   VENTILATOR SETTINGS: Vent Mode:  [-] PRVC FiO2 (%):  [50 %-100 %] 50 % Set Rate:  [16 bmp-20 bmp] 20 bmp Vt Set:  [580 mL] 580 mL PEEP:  [5 cmH20] 5 cmH20 Plateau Pressure:  [19 cmH20] 19 cmH20 INTAKE / OUTPUT:  Intake/Output Summary (Last 24 hours) at 08/29/14 1502 Last data filed at 08/29/14 1414  Gross per 24 hour  Intake   3000 ml  Output    500 ml  Net   2500 ml    PHYSICAL EXAMINATION: General:  Intubated, sedated male  in no acute distress Neuro:  Sedated. Pupils sluggishly reactive.  HEENT:  NCAT.  Cardiovascular:  S1, S2. 2/6 murmur left sternal border. 3+ pitting edema of BLE.  Lungs:  Rhonchi throughout.  Abdomen:  Soft, bowel sounds present.  Musculoskeletal:  Intact. Cervical collar in place.  Skin:  Macular rash noted of chest and shoulders. Chronic skin changes of BLE below knees.   LABS:  CBC  Recent Labs Lab 08/29/14 1126 08/29/14 1349  WBC 5.2 4.6  HGB 11.1* 9.4*  HCT 33.1* 27.2*  PLT 72* 57*   Coag's  Recent Labs Lab 08/29/14 1126  INR 1.48   BMET  Recent Labs Lab 08/29/14 1126 08/29/14 1349  NA 141 142  K 4.9 4.1  CL 109 111  CO2 17* 17*  BUN 36* 36*  CREATININE 1.65* 1.38*  GLUCOSE 203* 191*   Electrolytes  Recent Labs Lab 08/29/14 1126 08/29/14 1349  CALCIUM 8.5 8.3*   Sepsis Markers  Recent Labs Lab 08/29/14 1137 08/29/14 1349  LATICACIDVEN 10.58* 7.0*   ABG  Recent Labs Lab 08/29/14 1414 08/29/14 1423  PHART 7.408 7.433  PCO2ART  27.4* 28.2*  PO2ART 141.0* 126.0*   Liver Enzymes  Recent Labs Lab 08/29/14 1126  AST 61*  ALT 39  ALKPHOS 106  BILITOT 3.5*  ALBUMIN 3.0*   Cardiac Enzymes  Recent Labs Lab 08/29/14 1126  TROPONINI <0.03   Glucose  Recent Labs Lab 08/29/14 1112  GLUCAP 177*    Imaging CXR - vascular congestion  ASSESSMENT / PLAN:  PULMONARY OETT 4/8  Acute respiratory failure due to altered mental status  H/o COPD Risk for aspiration pneumonia P:   Full vent support Check ABG Chest xray in am VAP bundle  CARDIOVASCULAR H/o hypertension P:  Maintain normotension, goal MAP >65 Cont IVFs   RENAL Lactic acidosis - improving with volume AKI - creatinine improving with volume P:   Volume resuscitation Trend lactic acid until clears  GASTROINTESTINAL Alcoholic cirrhosis Elevated ammonia ? GIB - coffee ground aspirate initially after intubation, since resolved P:    Lactulose Rifaximin Stress ulcer prophylaxis - pantoprazole Follow ammonia   HEMATOLOGIC Thrombocytopenia Anemia of chronic disease P:  Trend CBC VTE prophylaxis - SCDs  INFECTIOUS Risk for aspiration pneumonia P:   BCx2 4/8 >> Urine culture 4/8 >> Sputum 4/8 >>   ENDOCRINE Diabetes mellitus R/o hypothyroidism in setting of hypothermia P:   Trend CBG.  SSI. Send TSH.  Slowly rewarm to normothermia.   NEUROLOGIC Acute hepatic encephalopathy in setting of elevated ammonia  Substance abuse P:   See GI section RASS goal: -1 to -2.  Monitor for withdrawal  FAMILY  - Updates:   - Inter-disciplinary family meet or Palliative Care meeting due by:  4/15    TODAY'S SUMMARY: 59 year-old male with history of alcoholic cirrhosis admitted with AMS, respiratory failure, lactic acidosis, and hypothermia in setting of elevated ammonia. Lactulose and rifaximin as ordered. Lactic acid and creatinine improving with volume resuscitation.    Erick Colace ACNP-BC [place] Pager # [phone] OR # [phone] if no answer  59 year old alcoholic male with cirrhosis admitted due to altered mental status from hepatic encephalopathy.  Unable to protect his airway.  Intubated.  Markedly elevated ammonia, lactulose started and rifaximin ordered.  Will lower ammonia levels with these measures then likely be able to extubate in the next few days.  The patient is critically ill with multiple organ systems failure and requires high complexity decision making for assessment and support, frequent evaluation and titration of therapies, application of advanced monitoring technologies and extensive interpretation of multiple databases.   Critical Care Time devoted to patient care services described in this note is  100  Minutes. This time reflects time of care of this signee Dr Jennet Maduro. This critical care time does not reflect procedure time, or teaching time or supervisory  time of PA/NP/Med student/Med Resident etc but could involve care discussion time.  Rush Farmer, M.D. [facility] Pulmonary/Critical Care Medicine. Pager: [phone]. After hours pager: [phone].  08/29/2014, 3:02 PM

## 2014-08-29 NOTE — ED Notes (Signed)
Critical care NP at bedside, placing central line.

## 2014-08-29 NOTE — ED Notes (Signed)
C Collar placed on pt per MD verbal order

## 2014-08-29 NOTE — Sedation Documentation (Signed)
Pt successfully intubated 

## 2014-08-30 ENCOUNTER — Encounter (HOSPITAL_COMMUNITY): Payer: Self-pay | Admitting: Gastroenterology

## 2014-08-30 ENCOUNTER — Encounter (HOSPITAL_COMMUNITY)
Admission: EM | Disposition: A | Discharge status: Skilled Nursing Facility | Payer: Self-pay | Source: Home / Self Care | Attending: Internal Medicine

## 2014-08-30 DIAGNOSIS — G9340 Encephalopathy, unspecified: Secondary | ICD-10-CM

## 2014-08-30 HISTORY — PX: ESOPHAGOGASTRODUODENOSCOPY: SHX5428

## 2014-08-30 LAB — BLOOD GAS, ARTERIAL
ACID-BASE DEFICIT: 0.2 mmol/L (ref 0.0–2.0)
BICARBONATE: 22.9 meq/L (ref 20.0–24.0)
DRAWN BY: 41308
FIO2: 0.4 %
MECHVT: 580 mL
O2 SAT: 99.3 %
PATIENT TEMPERATURE: 99.1
PEEP/CPAP: 5 cmH2O
PO2 ART: 128 mmHg — AB (ref 80.0–100.0)
RATE: 18 resp/min
TCO2: 23.8 mmol/L (ref 0–100)
pCO2 arterial: 31 mmHg — ABNORMAL LOW (ref 35.0–45.0)
pH, Arterial: 7.482 — ABNORMAL HIGH (ref 7.350–7.450)

## 2014-08-30 LAB — BASIC METABOLIC PANEL
Anion gap: 12 (ref 5–15)
BUN: 38 mg/dL — AB (ref 6–23)
CALCIUM: 8.1 mg/dL — AB (ref 8.4–10.5)
CO2: 20 mmol/L (ref 19–32)
Chloride: 113 mmol/L — ABNORMAL HIGH (ref 96–112)
Creatinine, Ser: 1.32 mg/dL (ref 0.50–1.35)
GFR calc Af Amer: 67 mL/min — ABNORMAL LOW (ref >90)
GFR calc non Af Amer: 58 mL/min — ABNORMAL LOW (ref >90)
GLUCOSE: 138 mg/dL — AB (ref 70–99)
Potassium: 3.8 mmol/L (ref 3.5–5.1)
Sodium: 145 mmol/L (ref 135–145)

## 2014-08-30 LAB — GLUCOSE, CAPILLARY
GLUCOSE-CAPILLARY: 93 mg/dL (ref 70–99)
Glucose-Capillary: 111 mg/dL — ABNORMAL HIGH (ref 70–99)
Glucose-Capillary: 124 mg/dL — ABNORMAL HIGH (ref 70–99)
Glucose-Capillary: 127 mg/dL — ABNORMAL HIGH (ref 70–99)
Glucose-Capillary: 138 mg/dL — ABNORMAL HIGH (ref 70–99)
Glucose-Capillary: 138 mg/dL — ABNORMAL HIGH (ref 70–99)

## 2014-08-30 LAB — CBC
HCT: 25.5 % — ABNORMAL LOW (ref 39.0–52.0)
HEMATOCRIT: 24.8 % — AB (ref 39.0–52.0)
HEMOGLOBIN: 8.4 g/dL — AB (ref 13.0–17.0)
HEMOGLOBIN: 8.4 g/dL — AB (ref 13.0–17.0)
MCH: 32.3 pg (ref 26.0–34.0)
MCH: 31.7 pg (ref 26.0–34.0)
MCHC: 33.9 g/dL (ref 30.0–36.0)
MCHC: 32.9 g/dL (ref 30.0–36.0)
MCV: 95.4 fL (ref 78.0–100.0)
MCV: 96.2 fL (ref 78.0–100.0)
PLATELETS: 69 10*3/uL — AB (ref 150–400)
Platelets: 59 10*3/uL — ABNORMAL LOW (ref 150–400)
RBC: 2.6 MIL/uL — ABNORMAL LOW (ref 4.22–5.81)
RBC: 2.65 MIL/uL — ABNORMAL LOW (ref 4.22–5.81)
RDW: 17.2 % — AB (ref 11.5–15.5)
RDW: 17.3 % — AB (ref 11.5–15.5)
WBC: 7.1 10*3/uL (ref 4.0–10.5)
WBC: 7.2 10*3/uL (ref 4.0–10.5)

## 2014-08-30 LAB — AMMONIA: AMMONIA: 104 umol/L — AB (ref 11–32)

## 2014-08-30 LAB — PHOSPHORUS: PHOSPHORUS: 2.9 mg/dL (ref 2.3–4.6)

## 2014-08-30 LAB — LACTIC ACID, PLASMA: Lactic Acid, Venous: 2.1 mmol/L (ref 0.5–2.0)

## 2014-08-30 LAB — HEMOGLOBIN AND HEMATOCRIT, BLOOD
HCT: 24.8 % — ABNORMAL LOW (ref 39.0–52.0)
HCT: 24.8 % — ABNORMAL LOW (ref 39.0–52.0)
HEMOGLOBIN: 8.2 g/dL — AB (ref 13.0–17.0)
Hemoglobin: 8.2 g/dL — ABNORMAL LOW (ref 13.0–17.0)

## 2014-08-30 LAB — MAGNESIUM: Magnesium: 2 mg/dL (ref 1.5–2.5)

## 2014-08-30 SURGERY — EGD (ESOPHAGOGASTRODUODENOSCOPY)
Anesthesia: Moderate Sedation

## 2014-08-30 MED ORDER — FENTANYL CITRATE 0.05 MG/ML IJ SOLN
INTRAMUSCULAR | Status: DC | PRN
  Administered 2014-08-30 (×2): 25 ug via INTRAVENOUS

## 2014-08-30 MED ORDER — OCTREOTIDE LOAD VIA INFUSION
50.0000 ug | Freq: Once | INTRAVENOUS | Status: AC
  Administered 2014-08-30: 50 ug via INTRAVENOUS
  Filled 2014-08-30: qty 25

## 2014-08-30 MED ORDER — PANTOPRAZOLE SODIUM 40 MG IV SOLR
40.0000 mg | Freq: Two times a day (BID) | INTRAVENOUS | Status: DC
  Administered 2014-08-30 – 2014-09-01 (×4): 40 mg via INTRAVENOUS
  Filled 2014-08-30 (×5): qty 40

## 2014-08-30 MED ORDER — FENTANYL CITRATE 0.05 MG/ML IJ SOLN
INTRAMUSCULAR | Status: AC
  Filled 2014-08-30: qty 2

## 2014-08-30 MED ORDER — FENTANYL CITRATE 0.05 MG/ML IJ SOLN
25.0000 ug | INTRAMUSCULAR | Status: DC | PRN
  Administered 2014-08-31: 50 ug via INTRAVENOUS
  Filled 2014-08-30: qty 2

## 2014-08-30 MED ORDER — OCTREOTIDE ACETATE 500 MCG/ML IJ SOLN
50.0000 ug/h | INTRAMUSCULAR | Status: DC
  Administered 2014-08-30 – 2014-08-31 (×4): 50 ug/h via INTRAVENOUS
  Filled 2014-08-30 (×8): qty 1

## 2014-08-30 MED ORDER — CEFTRIAXONE SODIUM IN DEXTROSE 20 MG/ML IV SOLN
1.0000 g | INTRAVENOUS | Status: DC
  Administered 2014-08-30 – 2014-08-31 (×2): 1 g via INTRAVENOUS
  Filled 2014-08-30 (×2): qty 50

## 2014-08-30 MED ORDER — MIDAZOLAM HCL 5 MG/ML IJ SOLN
INTRAMUSCULAR | Status: AC
  Filled 2014-08-30: qty 1

## 2014-08-30 MED ORDER — PANTOPRAZOLE SODIUM 40 MG IV SOLR: 40.0000 mg | Freq: Two times a day (BID) | INTRAVENOUS | Status: DC

## 2014-08-30 MED ORDER — SODIUM CHLORIDE 0.9 % IV SOLN
80.0000 mg | Freq: Once | INTRAVENOUS | Status: AC
  Administered 2014-08-30: 80 mg via INTRAVENOUS
  Filled 2014-08-30: qty 80

## 2014-08-30 MED ORDER — LORAZEPAM 2 MG/ML IJ SOLN: 1.0000 mg | INTRAMUSCULAR | Status: DC | PRN

## 2014-08-30 MED ORDER — MIDAZOLAM HCL 10 MG/2ML IJ SOLN
INTRAMUSCULAR | Status: DC | PRN
  Administered 2014-08-30 (×2): 2 mg via INTRAVENOUS

## 2014-08-30 MED ORDER — SODIUM CHLORIDE 0.9 % IV SOLN
8.0000 mg/h | INTRAVENOUS | Status: DC
  Filled 2014-08-30 (×5): qty 80

## 2014-08-30 NOTE — Progress Notes (Signed)
Pt is a 59yo M w/ PMH polysubstance abuse, DM2 w/ neuropathy, HTN, thrombocytopenia, GERD, EtOH cirrhosis, who was admitted with hepatic encephalopathy on 4/8.  Called by nursing regarding increased bright red output for NG tube at 4:50am. Per nursing, the patient had put out 200cc of bright red output from his NGT during the day. The nurse states that she clamped the NGT for a few hours overnight to administer the patient's lactulose. She unclamped the NGT around 2-3 am, and noticed approximately 200cc of new bright red drainage from the NGT.   He does have a h/o portal gastropathy, mild esophageal varices that flatten to air, and non-bleeding cecal AVMs seen on EGD and colonoscopy 12/2013 done by Dr. Oletta Lamas and appears to follow with Dr. Paulita Fujita as an outpatient.   PE: T 99.3  HR 68  RR 18  BP 119/50  O2 100% on vent General: Resting in bed, NAD Cardiac: RRR, no rubs, murmurs or gallops Pulm: Intubated. ETT in place. Clear to auscultation bilaterally, no wheezes, rales, or rhonchi Abd: Soft, nondistended, BS present. NGT in place with clearing drainage in the tube, now yellow to clear in color, but 400+ cc bright red drainage in container, does not appear to be only frank blood. Ext: Warm and well perfused Neuro: Sedated, but will respond to stimuli with partial opening of his eyes.   Labs: CBC: WBC 7.1 Hgb 8.4 from 9.4, and 11.1 on admission Hct 24.8 from 27.2, and 33.1 on admission Platelets 69 from 57, and 72 on admission  A&P: Pt is a 59yo M w/ PMH polysubstance abuse, DM2 w/ neuropathy, HTN, GERD, EtOH cirrhosis, who was admitted with hepatic encephalopathy on 4/8, and now likely has a GIB. GIB in setting of know alcoholic cirrhosis: H/o GIB with unimpressive scope in August '15. Now with new GIB. - Repeating CBC, and will transfuse based on results - Starting Rocephin in the setting of cirrhosis and bleed as SBP prophylaxis - Starting octreotide drip - Changing q12h IV PPI to  Protonix drip - Consulting GI for further evaluation --> spoke to Dr. Michail Sermon who will send someone to see the patient

## 2014-08-30 NOTE — H&P (View-Only) (Signed)
Referring Provider: Dr. Halford Chessman Primary Care Physician:  Barbette Merino, MD Primary Gastroenterologist:  Dr. Paulita Fujita  Reason for Consultation:  Hematemesis  HPI: [name] is a 59 y.o. male with history of alcoholic cirrhosis and polysubstance abuse (UDS positive for cocaine, benzos) with recent admit last month for hepatic encephalopathy. Patient was found unresponsive yesterday and intubated with initial coffee ground aspirate from OG tube. Early this morning he developed about 200 cc of bright red blood per NGT. No melena or hematochezia reported. Patient is sedated and on the ventilator. EGD in August 2015 showed small esophageal varices and mild portal hypertensive gastropathy. Colonoscopy at that time showed a nonbleeding cecal AVM. Ammonia 210 (Ammonia 59 on 08/18/14). No family available.   Past Medical History  Diagnosis Date  . Neuropathy   . Diabetes mellitus   . Bipolar affect, depressed   . Hypertension   . Arthritis   . Stroke     Mini stroke about 74yrs ago  . Cirrhosis   . Alcohol abuse   . Chronic pain   . Cocaine abuse   . Muscle spasm     both legs  . Encephalopathy, hepatic   . Detached retina   . COPD (chronic obstructive pulmonary disease)     emphysema  . Bronchitis   . Barrett's esophagus   . GERD (gastroesophageal reflux disease)     has ulcer  . Anemia     Past Surgical History  Procedure Laterality Date  . Fracture surgery      Leg and arm 15yrs ago  . Esophagogastroduodenoscopy  04/04/2012    Procedure: ESOPHAGOGASTRODUODENOSCOPY (EGD);  Surgeon: Irene Shipper, MD;  Location: [facility] ENDOSCOPY;  Service: Endoscopy;  Laterality: N/A;  . Esophagogastroduodenoscopy Left 03/13/2013    Procedure: ESOPHAGOGASTRODUODENOSCOPY (EGD);  Surgeon: Arta Silence, MD;  Location: [facility] ENDOSCOPY;  Service: Endoscopy;  Laterality: Left;  Marland Kitchen Eye surgery  8 months ago both eyes    cataracts both eyes, detached eye, gas pocket  . Vasectomy    . Pars plana vitrectomy Left  07/08/2013    Procedure: PARS PLANA VITRECTOMY WITH 25 GAUGE;  Surgeon: Hurman Horn, MD;  Location: [place];  Service: Ophthalmology;  Laterality: Left;  . Intraocular lens removal Left 07/08/2013    Procedure: REMOVAL OF INTRAOCULAR LENS;  Surgeon: Hurman Horn, MD;  Location: [place];  Service: Ophthalmology;  Laterality: Left;  . Placement and suture of secondary intraocular lens Left 07/08/2013    Procedure: PLACEMENT AND SUTURE OF SECONDARY INTRAOCULAR LENS;  Surgeon: Hurman Horn, MD;  Location: [place];  Service: Ophthalmology;  Laterality: Left;  Insertion of Anterior Capsule Intraocular Lens   . Esophagogastroduodenoscopy N/A 01/16/2014    Procedure: ESOPHAGOGASTRODUODENOSCOPY (EGD);  Surgeon: Winfield Cunas., MD;  Location: [facility] ENDOSCOPY;  Service: Endoscopy;  Laterality: N/A;  . Colonoscopy N/A 01/17/2014    Procedure: COLONOSCOPY;  Surgeon: Winfield Cunas., MD;  Location: [facility] ENDOSCOPY;  Service: Endoscopy;  Laterality: N/A;  possible banding    Prior to Admission medications   Medication Sig Start Date End Date Taking? Authorizing Provider  amitriptyline (ELAVIL) 25 MG tablet Take 25 mg by mouth at bedtime.   Yes Historical Provider, MD  clonazePAM (KLONOPIN) 0.5 MG tablet Take 0.5 mg by mouth 2 (two) times daily. 08/04/14  Yes Historical Provider, MD  diazepam (VALIUM) 2 MG tablet Take 2 mg by mouth 3 (three) times daily. 08/18/14  Yes Historical Provider, MD  diazepam (VALIUM) 5 MG tablet Take  5 mg by mouth 3 (three) times daily. 08/01/14  Yes Historical Provider, MD  diclofenac sodium (VOLTAREN) 1 % GEL Apply 4 g topically as needed (for back of leg pain).   Yes Historical Provider, MD  furosemide (LASIX) 40 MG tablet Take 40 mg by mouth daily. 08/18/14  Yes Historical Provider, MD  gabapentin (NEURONTIN) 600 MG tablet Take 0.5 tablets (300 mg total) by mouth 3 (three) times daily. 08/18/14  Yes Bonnielee Haff, MD  ibuprofen (ADVIL,MOTRIN) 800 MG tablet Take 800 mg by mouth every 8  (eight) hours as needed.   Yes Historical Provider, MD  insulin aspart (NOVOLOG FLEXPEN) 100 UNIT/ML FlexPen Inject 6 Units into the skin 3 (three) times daily with meals. For diabetes 02/19/14  Yes Encarnacion Slates, NP  Insulin Glargine (LANTUS SOLOSTAR) 100 UNIT/ML Solostar Pen Inject 20 units into the skin at bedtime: For diabetes 08/18/14  Yes Bonnielee Haff, MD  lactulose (CHRONULAC) 10 GM/15ML solution Take 45 mLs (30 g total) by mouth 3 (three) times daily. Patient taking differently: Take 30 g by mouth daily. Take every day per spouse 08/01/14  Yes Jessica U Vann, DO  lisinopril (PRINIVIL,ZESTRIL) 20 MG tablet Take 20 mg by mouth daily.   Yes Historical Provider, MD  Multiple Vitamin (MULTIVITAMIN WITH MINERALS) TABS tablet Take 1 tablet by mouth daily. 07/31/14  Yes Geradine Girt, DO  oxyCODONE (OXY IR/ROXICODONE) 5 MG immediate release tablet Take 1 tablet (5 mg total) by mouth every 6 (six) hours as needed for moderate pain. 08/18/14  Yes Bonnielee Haff, MD  Oxycodone HCl 10 MG TABS Take 10 mg by mouth 3 (three) times daily. 08/04/14  Yes Historical Provider, MD  pantoprazole (PROTONIX) 40 MG tablet Take 40 mg by mouth 2 (two) times daily. 07/09/14  Yes Historical Provider, MD    Scheduled Meds: . antiseptic oral rinse  7 mL Mouth Rinse QID  . cefTRIAXone (ROCEPHIN)  IV  1 g Intravenous Q24H  . chlorhexidine  15 mL Mouth Rinse BID  . insulin aspart  0-9 Units Subcutaneous 6 times per day  . lactulose  30 g Per Tube TID  . [START ON 09/02/2014] pantoprazole (PROTONIX) IV  40 mg Intravenous Q12H  . rifaximin  550 mg Per Tube BID   Continuous Infusions: . dextrose 5 % and 0.45% NaCl 100 mL/hr at 08/29/14 2000  . octreotide  (SANDOSTATIN)    IV infusion 50 mcg/hr (08/30/14 0834)  . pantoprozole (PROTONIX) infusion     PRN Meds:.sodium chloride, fentaNYL  Allergies as of 08/29/2014  . (No Known Allergies)    Family History  Problem Relation Age of Onset  . Hypotension Mother      History   Social History  . Marital Status: Single    Spouse Name: N/A  . Number of Children: N/A  . Years of Education: N/A   Occupational History  . Not on file.   Social History Main Topics  . Smoking status: Current Every Day Smoker -- 1.00 packs/day for 30 years    Types: Cigarettes    Last Attempt to Quit: 04/06/2012  . Smokeless tobacco: Never Used     Comment: quit   . Alcohol Use: 0.0 oz/week     Comment: 12 pk beer daily  06/2013 - no alcohol since 11/2012  . Drug Use: Yes    Special: Cocaine     Comment: 1 gram weekly  06/2013 - has not had any for a year  . Sexual Activity: Yes  Birth Control/ Protection: None   Other Topics Concern  . Not on file   Social History Narrative    Review of Systems: Unable to obtain (patient on ventilator)  Physical Exam: Vital signs: Filed Vitals:   08/30/14 0736  BP: 137/58  Pulse: 82  Temp: 98.9  Resp: 18   Last BM Date: 08/30/14 General:   Sedated, intubtated Head: normocephalic Lungs:  Coarse breath sounds Heart:  Regular rate and rhythm; no murmurs, clicks, rubs,  or gallops. Abdomen: soft, nontender, nondistended, +BS  Rectal:  Deferred Ext: no edema Skin: no rash  GI:  Lab Results:  Recent Labs  08/29/14 1349 08/30/14 0253 08/30/14 0737  WBC 4.6 7.1 7.2  HGB 9.4* 8.4* 8.4*  HCT 27.2* 24.8* 25.5*  PLT 57* 69* PENDING   BMET  Recent Labs  08/29/14 1126 08/29/14 1349 08/30/14 0253  NA 141 142 145  K 4.9 4.1 3.8  CL 109 111 113*  CO2 17* 17* 20  GLUCOSE 203* 191* 138*  BUN 36* 36* 38*  CREATININE 1.65* 1.38* 1.32  CALCIUM 8.5 8.3* 8.1*   LFT  Recent Labs  08/29/14 1126  PROT 6.7  ALBUMIN 3.0*  AST 61*  ALT 39  ALKPHOS 106  BILITOT 3.5*   PT/INR  Recent Labs  08/29/14 1126  LABPROT 18.1*  INR 1.48     Studies/Results: Ct Head Wo Contrast  08/29/2014   CLINICAL DATA:  Patient found unresponsive on the floor today.  EXAM: CT HEAD WITHOUT CONTRAST  CT CERVICAL SPINE  WITHOUT CONTRAST  TECHNIQUE: Multidetector CT imaging of the head and cervical spine was performed following the standard protocol without intravenous contrast. Multiplanar CT image reconstructions of the cervical spine were also generated.  COMPARISON:  Head CT scan 08/14/2014. Head and cervical spine CT scan 01/03/2004.  FINDINGS: CT HEAD FINDINGS  Cortical atrophy and chronic microvascular ischemic change are again seen. No evidence of acute intracranial abnormality including hemorrhage, infarct, mass lesion, mass effect, midline shift abnormal extra-axial fluid collection is identified. There is no hydrocephalus or pneumocephalus. Small left mastoid effusion is unchanged.  CT CERVICAL SPINE FINDINGS  There is no fracture or malalignment of the cervical spine. Intervertebral disc space height is maintained. Endotracheal tube and NG tube are in place. Lung apices are clear. Paraspinous soft tissue structures are unremarkable. Carotid atherosclerosis is noted.  IMPRESSION: No acute finding head or cervical spine.  Atrophy and chronic microvascular ischemic change.   Electronically Signed   By: Inge Rise M.D.   On: 08/29/2014 12:23   Ct Cervical Spine Wo Contrast  08/29/2014   CLINICAL DATA:  Patient found unresponsive on the floor today.  EXAM: CT HEAD WITHOUT CONTRAST  CT CERVICAL SPINE WITHOUT CONTRAST  TECHNIQUE: Multidetector CT imaging of the head and cervical spine was performed following the standard protocol without intravenous contrast. Multiplanar CT image reconstructions of the cervical spine were also generated.  COMPARISON:  Head CT scan 08/14/2014. Head and cervical spine CT scan 01/03/2004.  FINDINGS: CT HEAD FINDINGS  Cortical atrophy and chronic microvascular ischemic change are again seen. No evidence of acute intracranial abnormality including hemorrhage, infarct, mass lesion, mass effect, midline shift abnormal extra-axial fluid collection is identified. There is no hydrocephalus or  pneumocephalus. Small left mastoid effusion is unchanged.  CT CERVICAL SPINE FINDINGS  There is no fracture or malalignment of the cervical spine. Intervertebral disc space height is maintained. Endotracheal tube and NG tube are in place. Lung apices are clear. Paraspinous soft  tissue structures are unremarkable. Carotid atherosclerosis is noted.  IMPRESSION: No acute finding head or cervical spine.  Atrophy and chronic microvascular ischemic change.   Electronically Signed   By: Inge Rise M.D.   On: 08/29/2014 12:23   Dg Chest Port 1 View  08/29/2014   CLINICAL DATA:  Respiratory failure.  EXAM: PORTABLE CHEST - 1 VIEW  COMPARISON:  08/29/2014 at 11:28 a.m.  FINDINGS: Endotracheal tube with tip 4.2 cm above the carina. Enteric tube courses into the region of the stomach and off the inferior portion of the film. Interval placement of left-sided catheter over the left neck extending inferiorly following a course more consistent with arterial location left of midline below the level of the carina possibly within the descending thoracic aorta or proximal ascending thoracic aorta.  Lungs are hypoinflated. No evidence of left pneumothorax. Mild prominence of the perihilar markings suggesting minimal vascular congestion. Cardiomediastinal silhouette and remainder of the exam is unchanged.  IMPRESSION: Hypoinflation with suggestion of mild vascular congestion.  Tubes and lines as described. Note that the left sided vascular catheter appears to take a course more consistent with arterial location possibly within the proximal ascending aorta or descending aorta. No pneumothorax.  Critical Value/emergent results were called by telephone at the time of interpretation on 08/29/2014 at 2:06 pm to Dr. Tanna Furry, who verbally acknowledged these results.   Electronically Signed   By: Marin Olp M.D.   On: 08/29/2014 14:06   Dg Chest Port 1 View  08/29/2014   CLINICAL DATA:  Unresponsive.  EXAM: PORTABLE CHEST - 1 VIEW   COMPARISON:  Radiograph 08/15/2014  FINDINGS: Endotracheal tube approximately 5.8 cm from carina. NG tube in stomach. No effusion, infiltrate, pneumothorax. Low lung volumes.  IMPRESSION: Endotracheal tube in appropriate position.  Low lung volumes.   Electronically Signed   By: Suzy Bouchard M.D.   On: 08/29/2014 11:41    Impression/Plan: 59 yo with alcoholic cirrhosis and polysubstance abuse (+cocaine on UDS) being seen for an upper GI bleed with bright red blood from NG aspirate. No melena or hematochezia reported. DDx peptic ulcer bleed vs. Esophagitis vs. Gastritis vs. Variceal bleed (doubt) vs. NG trauma (most likely). Needs bedside EGD this morning to further evaluate. Continue Protonix drip, Octreotide drip. Agree with IV Abx. Continue Lactulose and Rifaximin. Poor overall prognosis especially with his ongoing polysubstance abuse and noncompliance in the setting of cirrhosis. No family available to obtain consent from so will do emergent consent for the EGD.    LOS: 1 day   Willard C.  08/30/2014, 9:26 AM

## 2014-08-30 NOTE — Progress Notes (Signed)
59yo male to begin IV ABX during GIB in setting of cirrhosis.  Will start Rocephin 1g IV Q24H and monitor CBC and Cx.  Wynona Neat, PharmD, BCPS 08/30/2014 5:43 AM

## 2014-08-30 NOTE — Brief Op Note (Signed)
Mild to moderate portal gastropathy with friable mucosa and small amount of active bleeding noted likely due to NG trauma. Minimal esophageal varices without any stigmata. No ulcers noted. NG tube discontinued during the EGD and needs to remain out. Continue Octreotide due to bleeding noted from gastropathy. Will change to IV PPI Q 12 hours. Supportive care. See endopro note for complete details. D/W Dr. Halford Chessman.

## 2014-08-30 NOTE — Interval H&P Note (Signed)
History and Physical Interval Note:  08/30/2014 10:35 AM  [name]  has presented today for surgery, with the diagnosis of gi bleed  The various methods of treatment have been discussed with the patient and family. After consideration of risks, benefits and other options for treatment, the patient has consented to  Procedure(s) with comments: ESOPHAGOGASTRODUODENOSCOPY (EGD) (N/A) - bedside as a surgical intervention .  The patient's history has been reviewed, patient examined, no change in status, stable for surgery.  I have reviewed the patient's chart and labs.  Questions were answered to the patient's satisfaction.     [place] C.

## 2014-08-30 NOTE — Consult Note (Signed)
Referring Provider: Dr. Halford Chessman Primary Care Physician:  Barbette Merino, MD Primary Gastroenterologist:  Dr. Paulita Fujita  Reason for Consultation:  Hematemesis  HPI: [name] is a 59 y.o. male with history of alcoholic cirrhosis and polysubstance abuse (UDS positive for cocaine, benzos) with recent admit last month for hepatic encephalopathy. Patient was found unresponsive yesterday and intubated with initial coffee ground aspirate from OG tube. Early this morning he developed about 200 cc of bright red blood per NGT. No melena or hematochezia reported. Patient is sedated and on the ventilator. EGD in August 2015 showed small esophageal varices and mild portal hypertensive gastropathy. Colonoscopy at that time showed a nonbleeding cecal AVM. Ammonia 210 (Ammonia 59 on 08/18/14). No family available.   Past Medical History  Diagnosis Date  . Neuropathy   . Diabetes mellitus   . Bipolar affect, depressed   . Hypertension   . Arthritis   . Stroke     Mini stroke about 67yrs ago  . Cirrhosis   . Alcohol abuse   . Chronic pain   . Cocaine abuse   . Muscle spasm     both legs  . Encephalopathy, hepatic   . Detached retina   . COPD (chronic obstructive pulmonary disease)     emphysema  . Bronchitis   . Barrett's esophagus   . GERD (gastroesophageal reflux disease)     has ulcer  . Anemia     Past Surgical History  Procedure Laterality Date  . Fracture surgery      Leg and arm 73yrs ago  . Esophagogastroduodenoscopy  04/04/2012    Procedure: ESOPHAGOGASTRODUODENOSCOPY (EGD);  Surgeon: Irene Shipper, MD;  Location: [facility] ENDOSCOPY;  Service: Endoscopy;  Laterality: N/A;  . Esophagogastroduodenoscopy Left 03/13/2013    Procedure: ESOPHAGOGASTRODUODENOSCOPY (EGD);  Surgeon: Arta Silence, MD;  Location: [facility] ENDOSCOPY;  Service: Endoscopy;  Laterality: Left;  Marland Kitchen Eye surgery  8 months ago both eyes    cataracts both eyes, detached eye, gas pocket  . Vasectomy    . Pars plana vitrectomy Left  07/08/2013    Procedure: PARS PLANA VITRECTOMY WITH 25 GAUGE;  Surgeon: Hurman Horn, MD;  Location: [place];  Service: Ophthalmology;  Laterality: Left;  . Intraocular lens removal Left 07/08/2013    Procedure: REMOVAL OF INTRAOCULAR LENS;  Surgeon: Hurman Horn, MD;  Location: [place];  Service: Ophthalmology;  Laterality: Left;  . Placement and suture of secondary intraocular lens Left 07/08/2013    Procedure: PLACEMENT AND SUTURE OF SECONDARY INTRAOCULAR LENS;  Surgeon: Hurman Horn, MD;  Location: [place];  Service: Ophthalmology;  Laterality: Left;  Insertion of Anterior Capsule Intraocular Lens   . Esophagogastroduodenoscopy N/A 01/16/2014    Procedure: ESOPHAGOGASTRODUODENOSCOPY (EGD);  Surgeon: Winfield Cunas., MD;  Location: [facility] ENDOSCOPY;  Service: Endoscopy;  Laterality: N/A;  . Colonoscopy N/A 01/17/2014    Procedure: COLONOSCOPY;  Surgeon: Winfield Cunas., MD;  Location: [facility] ENDOSCOPY;  Service: Endoscopy;  Laterality: N/A;  possible banding    Prior to Admission medications   Medication Sig Start Date End Date Taking? Authorizing Provider  amitriptyline (ELAVIL) 25 MG tablet Take 25 mg by mouth at bedtime.   Yes Historical Provider, MD  clonazePAM (KLONOPIN) 0.5 MG tablet Take 0.5 mg by mouth 2 (two) times daily. 08/04/14  Yes Historical Provider, MD  diazepam (VALIUM) 2 MG tablet Take 2 mg by mouth 3 (three) times daily. 08/18/14  Yes Historical Provider, MD  diazepam (VALIUM) 5 MG tablet Take  5 mg by mouth 3 (three) times daily. 08/01/14  Yes Historical Provider, MD  diclofenac sodium (VOLTAREN) 1 % GEL Apply 4 g topically as needed (for back of leg pain).   Yes Historical Provider, MD  furosemide (LASIX) 40 MG tablet Take 40 mg by mouth daily. 08/18/14  Yes Historical Provider, MD  gabapentin (NEURONTIN) 600 MG tablet Take 0.5 tablets (300 mg total) by mouth 3 (three) times daily. 08/18/14  Yes Bonnielee Haff, MD  ibuprofen (ADVIL,MOTRIN) 800 MG tablet Take 800 mg by mouth every 8  (eight) hours as needed.   Yes Historical Provider, MD  insulin aspart (NOVOLOG FLEXPEN) 100 UNIT/ML FlexPen Inject 6 Units into the skin 3 (three) times daily with meals. For diabetes 02/19/14  Yes Encarnacion Slates, NP  Insulin Glargine (LANTUS SOLOSTAR) 100 UNIT/ML Solostar Pen Inject 20 units into the skin at bedtime: For diabetes 08/18/14  Yes Bonnielee Haff, MD  lactulose (CHRONULAC) 10 GM/15ML solution Take 45 mLs (30 g total) by mouth 3 (three) times daily. Patient taking differently: Take 30 g by mouth daily. Take every day per spouse 08/01/14  Yes Jessica U Vann, DO  lisinopril (PRINIVIL,ZESTRIL) 20 MG tablet Take 20 mg by mouth daily.   Yes Historical Provider, MD  Multiple Vitamin (MULTIVITAMIN WITH MINERALS) TABS tablet Take 1 tablet by mouth daily. 07/31/14  Yes Geradine Girt, DO  oxyCODONE (OXY IR/ROXICODONE) 5 MG immediate release tablet Take 1 tablet (5 mg total) by mouth every 6 (six) hours as needed for moderate pain. 08/18/14  Yes Bonnielee Haff, MD  Oxycodone HCl 10 MG TABS Take 10 mg by mouth 3 (three) times daily. 08/04/14  Yes Historical Provider, MD  pantoprazole (PROTONIX) 40 MG tablet Take 40 mg by mouth 2 (two) times daily. 07/09/14  Yes Historical Provider, MD    Scheduled Meds: . antiseptic oral rinse  7 mL Mouth Rinse QID  . cefTRIAXone (ROCEPHIN)  IV  1 g Intravenous Q24H  . chlorhexidine  15 mL Mouth Rinse BID  . insulin aspart  0-9 Units Subcutaneous 6 times per day  . lactulose  30 g Per Tube TID  . [START ON 09/02/2014] pantoprazole (PROTONIX) IV  40 mg Intravenous Q12H  . rifaximin  550 mg Per Tube BID   Continuous Infusions: . dextrose 5 % and 0.45% NaCl 100 mL/hr at 08/29/14 2000  . octreotide  (SANDOSTATIN)    IV infusion 50 mcg/hr (08/30/14 0834)  . pantoprozole (PROTONIX) infusion     PRN Meds:.sodium chloride, fentaNYL  Allergies as of 08/29/2014  . (No Known Allergies)    Family History  Problem Relation Age of Onset  . Hypotension Mother      History   Social History  . Marital Status: Single    Spouse Name: N/A  . Number of Children: N/A  . Years of Education: N/A   Occupational History  . Not on file.   Social History Main Topics  . Smoking status: Current Every Day Smoker -- 1.00 packs/day for 30 years    Types: Cigarettes    Last Attempt to Quit: 04/06/2012  . Smokeless tobacco: Never Used     Comment: quit   . Alcohol Use: 0.0 oz/week     Comment: 12 pk beer daily  06/2013 - no alcohol since 11/2012  . Drug Use: Yes    Special: Cocaine     Comment: 1 gram weekly  06/2013 - has not had any for a year  . Sexual Activity: Yes  Birth Control/ Protection: None   Other Topics Concern  . Not on file   Social History Narrative    Review of Systems: Unable to obtain (patient on ventilator)  Physical Exam: Vital signs: Filed Vitals:   08/30/14 0736  BP: 137/58  Pulse: 82  Temp: 98.9  Resp: 18   Last BM Date: 08/30/14 General:   Sedated, intubtated Head: normocephalic Lungs:  Coarse breath sounds Heart:  Regular rate and rhythm; no murmurs, clicks, rubs,  or gallops. Abdomen: soft, nontender, nondistended, +BS  Rectal:  Deferred Ext: no edema Skin: no rash  GI:  Lab Results:  Recent Labs  08/29/14 1349 08/30/14 0253 08/30/14 0737  WBC 4.6 7.1 7.2  HGB 9.4* 8.4* 8.4*  HCT 27.2* 24.8* 25.5*  PLT 57* 69* PENDING   BMET  Recent Labs  08/29/14 1126 08/29/14 1349 08/30/14 0253  NA 141 142 145  K 4.9 4.1 3.8  CL 109 111 113*  CO2 17* 17* 20  GLUCOSE 203* 191* 138*  BUN 36* 36* 38*  CREATININE 1.65* 1.38* 1.32  CALCIUM 8.5 8.3* 8.1*   LFT  Recent Labs  08/29/14 1126  PROT 6.7  ALBUMIN 3.0*  AST 61*  ALT 39  ALKPHOS 106  BILITOT 3.5*   PT/INR  Recent Labs  08/29/14 1126  LABPROT 18.1*  INR 1.48     Studies/Results: Ct Head Wo Contrast  08/29/2014   CLINICAL DATA:  Patient found unresponsive on the floor today.  EXAM: CT HEAD WITHOUT CONTRAST  CT CERVICAL SPINE  WITHOUT CONTRAST  TECHNIQUE: Multidetector CT imaging of the head and cervical spine was performed following the standard protocol without intravenous contrast. Multiplanar CT image reconstructions of the cervical spine were also generated.  COMPARISON:  Head CT scan 08/14/2014. Head and cervical spine CT scan 01/03/2004.  FINDINGS: CT HEAD FINDINGS  Cortical atrophy and chronic microvascular ischemic change are again seen. No evidence of acute intracranial abnormality including hemorrhage, infarct, mass lesion, mass effect, midline shift abnormal extra-axial fluid collection is identified. There is no hydrocephalus or pneumocephalus. Small left mastoid effusion is unchanged.  CT CERVICAL SPINE FINDINGS  There is no fracture or malalignment of the cervical spine. Intervertebral disc space height is maintained. Endotracheal tube and NG tube are in place. Lung apices are clear. Paraspinous soft tissue structures are unremarkable. Carotid atherosclerosis is noted.  IMPRESSION: No acute finding head or cervical spine.  Atrophy and chronic microvascular ischemic change.   Electronically Signed   By: Inge Rise M.D.   On: 08/29/2014 12:23   Ct Cervical Spine Wo Contrast  08/29/2014   CLINICAL DATA:  Patient found unresponsive on the floor today.  EXAM: CT HEAD WITHOUT CONTRAST  CT CERVICAL SPINE WITHOUT CONTRAST  TECHNIQUE: Multidetector CT imaging of the head and cervical spine was performed following the standard protocol without intravenous contrast. Multiplanar CT image reconstructions of the cervical spine were also generated.  COMPARISON:  Head CT scan 08/14/2014. Head and cervical spine CT scan 01/03/2004.  FINDINGS: CT HEAD FINDINGS  Cortical atrophy and chronic microvascular ischemic change are again seen. No evidence of acute intracranial abnormality including hemorrhage, infarct, mass lesion, mass effect, midline shift abnormal extra-axial fluid collection is identified. There is no hydrocephalus or  pneumocephalus. Small left mastoid effusion is unchanged.  CT CERVICAL SPINE FINDINGS  There is no fracture or malalignment of the cervical spine. Intervertebral disc space height is maintained. Endotracheal tube and NG tube are in place. Lung apices are clear. Paraspinous soft  tissue structures are unremarkable. Carotid atherosclerosis is noted.  IMPRESSION: No acute finding head or cervical spine.  Atrophy and chronic microvascular ischemic change.   Electronically Signed   By: Inge Rise M.D.   On: 08/29/2014 12:23   Dg Chest Port 1 View  08/29/2014   CLINICAL DATA:  Respiratory failure.  EXAM: PORTABLE CHEST - 1 VIEW  COMPARISON:  08/29/2014 at 11:28 a.m.  FINDINGS: Endotracheal tube with tip 4.2 cm above the carina. Enteric tube courses into the region of the stomach and off the inferior portion of the film. Interval placement of left-sided catheter over the left neck extending inferiorly following a course more consistent with arterial location left of midline below the level of the carina possibly within the descending thoracic aorta or proximal ascending thoracic aorta.  Lungs are hypoinflated. No evidence of left pneumothorax. Mild prominence of the perihilar markings suggesting minimal vascular congestion. Cardiomediastinal silhouette and remainder of the exam is unchanged.  IMPRESSION: Hypoinflation with suggestion of mild vascular congestion.  Tubes and lines as described. Note that the left sided vascular catheter appears to take a course more consistent with arterial location possibly within the proximal ascending aorta or descending aorta. No pneumothorax.  Critical Value/emergent results were called by telephone at the time of interpretation on 08/29/2014 at 2:06 pm to Dr. Tanna Furry, who verbally acknowledged these results.   Electronically Signed   By: Marin Olp M.D.   On: 08/29/2014 14:06   Dg Chest Port 1 View  08/29/2014   CLINICAL DATA:  Unresponsive.  EXAM: PORTABLE CHEST - 1 VIEW   COMPARISON:  Radiograph 08/15/2014  FINDINGS: Endotracheal tube approximately 5.8 cm from carina. NG tube in stomach. No effusion, infiltrate, pneumothorax. Low lung volumes.  IMPRESSION: Endotracheal tube in appropriate position.  Low lung volumes.   Electronically Signed   By: Suzy Bouchard M.D.   On: 08/29/2014 11:41    Impression/Plan: 59 yo with alcoholic cirrhosis and polysubstance abuse (+cocaine on UDS) being seen for an upper GI bleed with bright red blood from NG aspirate. No melena or hematochezia reported. DDx peptic ulcer bleed vs. Esophagitis vs. Gastritis vs. Variceal bleed (doubt) vs. NG trauma (most likely). Needs bedside EGD this morning to further evaluate. Continue Protonix drip, Octreotide drip. Agree with IV Abx. Continue Lactulose and Rifaximin. Poor overall prognosis especially with his ongoing polysubstance abuse and noncompliance in the setting of cirrhosis. No family available to obtain consent from so will do emergent consent for the EGD.    LOS: 1 day   [place] C.  08/30/2014, 9:26 AM

## 2014-08-30 NOTE — Op Note (Signed)
[facility] [place], [id number]   ENDOSCOPY PROCEDURE REPORT  PATIENT: [name], [name]  MR#: [id number] BIRTHDATE: [birth year] , 59  yrs. old GENDER: male ENDOSCOPIST: Wilford Corner, MD REFERRED BY: PROCEDURE DATE:  2014/09/03 PROCEDURE:  EGD, diagnostic ASA CLASS:     Class V INDICATIONS:  hematemesis. MEDICATIONS: Fentanyl 50 mcg IV and Versed 4 mg IV   (patient intubated on admit) TOPICAL ANESTHETIC: none  DESCRIPTION OF PROCEDURE: After the risks benefits and alternatives of the procedure were thoroughly explained, informed consent was obtained.  The Pentax Gastroscope [id number] endoscope was introduced through the mouth and advanced to the second portion of the duodenum , Without limitations.  The instrument was slowly withdrawn as the mucosa was fully examined. Estimated blood loss is zero unless otherwise noted in this procedure report.    Minimal esophageal varices noted in the mid-distal esophagus that flattened with insufflation. No bleeding stigmata seen. GEJ 45 cm from the incisors. Mild to moderate portal gastropathy noted in the distal stomach with a few areas with a small amount of bleeding seen. Distal end of NG tube noted in this area and NG tube removed. Duodenal bulb and 2nd portion of the duodenum were normal in appearance.        Retroflexed views revealed portal gastropathy with a mosaic pattern noted and one focal area with a small amount of oozing of blood seen.     The scope was then withdrawn from the patient and the procedure completed.  COMPLICATIONS: There were no immediate complications.  ENDOSCOPIC IMPRESSION:     1. Mild to moderate portal gastropathy with small amount of active bleeding likely due to NGT suction 2. Minimal nonbleeding esophageal varices  RECOMMENDATIONS:     Continue Octreotide drip; Supportive care; No OG or NG tubes; Change to IV PPI Q 12 hours   eSigned:  Wilford Corner, MD  Sep 03, 2014 11:39 AM    CC:  CPT CODES: ICD CODES:  The ICD and CPT codes recommended by this software are interpretations from the data that the clinical staff has captured with the software.  The verification of the translation of this report to the ICD and CPT codes and modifiers is the sole responsibility of the health care institution and practicing physician where this report was generated.  [employer] will not be held responsible for the validity of the ICD and CPT codes included on this report.  AMA assumes no liability for data contained or not contained herein. CPT is a Designer, television/film set of the [employer].  PATIENT NAME:  [name], [name] MR#: [id number]

## 2014-08-30 NOTE — Progress Notes (Signed)
08/30/2014- Respiratory care note- Pt suctioned and extubated at 1435 as per MD order.  Vital signs post extubation- [id number], sats 99% on 3lpm cannula, rr12.  Pt tolerated procedure well with pt vocalizing post extubation.  Ventilator and ambu-bag on standby at bedside.  Will cont to monitor pt progress and wean oxygen as tolerated.  S Charlesa Ehle rrt, rcp

## 2014-08-30 NOTE — Progress Notes (Signed)
PULMONARY / CRITICAL CARE MEDICINE   Name: [name] MRN: [id number] DOB: 19-Feb-1956    ADMISSION DATE:  08/29/2014 CONSULTATION DATE:  08/29/2014  REFERRING MD :Jeneen Rinks  CHIEF COMPLAINT:  Altered mental status  INITIAL PRESENTATION:  59 year-old male with known history of alcoholic cirrhosis and noncompliance with lactulose found unresponsive at home on floor 4/8 by home health nurse with TV and desk knocked over. Patient was incontinent, posturing, with erratic eye movements. Intubated in ED. Ammonia level was  210. PCCM to admit.   STUDIES:  4/8 cervical spine - no acute finding  4/8 head CT - no acute finding    SIGNIFICANT EVENTS: 4/8 - intubated   SUBJECTIVE:  Bleeding overnight.  VITAL SIGNS: Temp:  [92.1 F (33.4 C)-99.7 F (37.6 C)] 98.9 F (37.2 C) (04/09 0600) Pulse Rate:  [64-103] 82 (04/09 0736) Resp:  [16-23] 18 (04/09 0736) BP: (101-189)/(44-126) 137/58 mmHg (04/09 0736) SpO2:  [97 %-100 %] 100 % (04/09 0736) FiO2 (%):  [35 %-100 %] 40 % (04/09 0736) Weight:  [211 lb (95.709 kg)-217 lb 6 oz (98.6 kg)] 217 lb 6 oz (98.6 kg) (04/09 0500) HEMODYNAMICS:   VENTILATOR SETTINGS: Vent Mode:  [-] PRVC FiO2 (%):  [35 %-100 %] 40 % Set Rate:  [16 bmp-20 bmp] 18 bmp Vt Set:  [580 mL] 580 mL PEEP:  [5 cmH20] 5 cmH20 Plateau Pressure:  [15 cmH20-19 cmH20] 15 cmH20 INTAKE / OUTPUT:  Intake/Output Summary (Last 24 hours) at 08/30/14 0853 Last data filed at 08/30/14 0600  Gross per 24 hour  Intake 4451.25 ml  Output   1480 ml  Net 2971.25 ml    PHYSICAL EXAMINATION: General:  Intubated, sedated male in no acute distress Neuro:  Sedated. Pupils sluggishly reactive.  HEENT:  NCAT.  Cardiovascular:  S1, S2. 2/6 murmur left sternal border. 3+ pitting edema of BLE.  Lungs:  Rhonchi throughout.  Abdomen:  Soft, bowel sounds present.  Musculoskeletal:  Intact. Cervical collar in place.  Skin:  Macular rash noted of chest and shoulders. Chronic skin changes of BLE  below knees.   LABS:  CBC  Recent Labs Lab 08/29/14 1126 08/29/14 1349 08/30/14 0253  WBC 5.2 4.6 7.1  HGB 11.1* 9.4* 8.4*  HCT 33.1* 27.2* 24.8*  PLT 72* 57* 69*   Coag's  Recent Labs Lab 08/29/14 1126  INR 1.48   BMET  Recent Labs Lab 08/29/14 1126 08/29/14 1349 08/30/14 0253  NA 141 142 145  K 4.9 4.1 3.8  CL 109 111 113*  CO2 17* 17* 20  BUN 36* 36* 38*  CREATININE 1.65* 1.38* 1.32  GLUCOSE 203* 191* 138*   Electrolytes  Recent Labs Lab 08/29/14 1126 08/29/14 1349 08/30/14 0253  CALCIUM 8.5 8.3* 8.1*  MG  --   --  2.0  PHOS  --   --  2.9   Sepsis Markers  Recent Labs Lab 08/29/14 1349 08/29/14 1844 08/29/14 2315  LATICACIDVEN 7.0* 3.7* 2.1*  PROCALCITON <0.10  --   --    ABG  Recent Labs Lab 08/29/14 1414 08/29/14 1423 08/30/14 0400  PHART 7.408 7.433 7.482*  PCO2ART 27.4* 28.2* 31.0*  PO2ART 141.0* 126.0* 128.0*   Liver Enzymes  Recent Labs Lab 08/29/14 1126  AST 61*  ALT 39  ALKPHOS 106  BILITOT 3.5*  ALBUMIN 3.0*   Cardiac Enzymes  Recent Labs Lab 08/29/14 1126  TROPONINI <0.03   Glucose  Recent Labs Lab 08/29/14 1112 08/29/14 1659 08/29/14 2031 08/30/14 0022 08/30/14  0428 08/30/14 0746  GLUCAP 177* 162* 179* 138* 127* 124*    Imaging Ct Head Wo Contrast  08/29/2014   CLINICAL DATA:  Patient found unresponsive on the floor today.  EXAM: CT HEAD WITHOUT CONTRAST  CT CERVICAL SPINE WITHOUT CONTRAST  TECHNIQUE: Multidetector CT imaging of the head and cervical spine was performed following the standard protocol without intravenous contrast. Multiplanar CT image reconstructions of the cervical spine were also generated.  COMPARISON:  Head CT scan 08/14/2014. Head and cervical spine CT scan 01/03/2004.  FINDINGS: CT HEAD FINDINGS  Cortical atrophy and chronic microvascular ischemic change are again seen. No evidence of acute intracranial abnormality including hemorrhage, infarct, mass lesion, mass effect, midline  shift abnormal extra-axial fluid collection is identified. There is no hydrocephalus or pneumocephalus. Small left mastoid effusion is unchanged.  CT CERVICAL SPINE FINDINGS  There is no fracture or malalignment of the cervical spine. Intervertebral disc space height is maintained. Endotracheal tube and NG tube are in place. Lung apices are clear. Paraspinous soft tissue structures are unremarkable. Carotid atherosclerosis is noted.  IMPRESSION: No acute finding head or cervical spine.  Atrophy and chronic microvascular ischemic change.   Electronically Signed   By: Inge Rise M.D.   On: 08/29/2014 12:23   Ct Cervical Spine Wo Contrast  08/29/2014   CLINICAL DATA:  Patient found unresponsive on the floor today.  EXAM: CT HEAD WITHOUT CONTRAST  CT CERVICAL SPINE WITHOUT CONTRAST  TECHNIQUE: Multidetector CT imaging of the head and cervical spine was performed following the standard protocol without intravenous contrast. Multiplanar CT image reconstructions of the cervical spine were also generated.  COMPARISON:  Head CT scan 08/14/2014. Head and cervical spine CT scan 01/03/2004.  FINDINGS: CT HEAD FINDINGS  Cortical atrophy and chronic microvascular ischemic change are again seen. No evidence of acute intracranial abnormality including hemorrhage, infarct, mass lesion, mass effect, midline shift abnormal extra-axial fluid collection is identified. There is no hydrocephalus or pneumocephalus. Small left mastoid effusion is unchanged.  CT CERVICAL SPINE FINDINGS  There is no fracture or malalignment of the cervical spine. Intervertebral disc space height is maintained. Endotracheal tube and NG tube are in place. Lung apices are clear. Paraspinous soft tissue structures are unremarkable. Carotid atherosclerosis is noted.  IMPRESSION: No acute finding head or cervical spine.  Atrophy and chronic microvascular ischemic change.   Electronically Signed   By: Inge Rise M.D.   On: 08/29/2014 12:23   Dg Chest  Port 1 View  08/29/2014   CLINICAL DATA:  Respiratory failure.  EXAM: PORTABLE CHEST - 1 VIEW  COMPARISON:  08/29/2014 at 11:28 a.m.  FINDINGS: Endotracheal tube with tip 4.2 cm above the carina. Enteric tube courses into the region of the stomach and off the inferior portion of the film. Interval placement of left-sided catheter over the left neck extending inferiorly following a course more consistent with arterial location left of midline below the level of the carina possibly within the descending thoracic aorta or proximal ascending thoracic aorta.  Lungs are hypoinflated. No evidence of left pneumothorax. Mild prominence of the perihilar markings suggesting minimal vascular congestion. Cardiomediastinal silhouette and remainder of the exam is unchanged.  IMPRESSION: Hypoinflation with suggestion of mild vascular congestion.  Tubes and lines as described. Note that the left sided vascular catheter appears to take a course more consistent with arterial location possibly within the proximal ascending aorta or descending aorta. No pneumothorax.  Critical Value/emergent results were called by telephone at the time of  interpretation on 08/29/2014 at 2:06 pm to Dr. Tanna Furry, who verbally acknowledged these results.   Electronically Signed   By: Marin Olp M.D.   On: 08/29/2014 14:06   Dg Chest Port 1 View  08/29/2014   CLINICAL DATA:  Unresponsive.  EXAM: PORTABLE CHEST - 1 VIEW  COMPARISON:  Radiograph 08/15/2014  FINDINGS: Endotracheal tube approximately 5.8 cm from carina. NG tube in stomach. No effusion, infiltrate, pneumothorax. Low lung volumes.  IMPRESSION: Endotracheal tube in appropriate position.  Low lung volumes.   Electronically Signed   By: Suzy Bouchard M.D.   On: 08/29/2014 11:41    ASSESSMENT / PLAN:  PULMONARY OETT 4/8  A: Acute respiratory failure due to altered mental status  H/o COPD Risk for aspiration pneumonia P:   Full vent support Chest xray in am VAP  bundle  CARDIOVASCULAR A: H/o hypertension P:  Maintain normotension, goal MAP >65 Continue IVFs   RENAL A: Lactic acidosis - improving with volume AKI - creatinine improving with volume Non gap met acidosis P:   Volume resuscitation  GASTROINTESTINAL A: Alcoholic cirrhosis Elevated ammonia GI bleed P:   Lactulose, Rifaximin when able to use stomach Protonix, octreotide GI to assess  HEMATOLOGIC A: Thrombocytopenia Anemia from GI bleed P:  Trend CBC VTE prophylaxis - SCDs  INFECTIOUS A: Risk for aspiration pneumonia P:   BCx2 4/8 >> Urine culture 4/8 >> Sputum 4/8 >>  ENDOCRINE A: Diabetes mellitus P:   Trend CBG.  SSI.  NEUROLOGIC A: Acute hepatic encephalopathy in setting of elevated ammonia  Substance abuse P:   RASS goal: -1  Monitor for withdrawal  CC time 35 minutes.  Chesley Mires, MD [facility] Pulmonary/Critical Care 08/30/2014, 8:56 AM Pager:  [phone] After 3pm call: [phone]

## 2014-08-31 ENCOUNTER — Inpatient Hospital Stay (HOSPITAL_COMMUNITY): Payer: Medicare PPO

## 2014-08-31 DIAGNOSIS — K7030 Alcoholic cirrhosis of liver without ascites: Secondary | ICD-10-CM

## 2014-08-31 DIAGNOSIS — K922 Gastrointestinal hemorrhage, unspecified: Secondary | ICD-10-CM

## 2014-08-31 LAB — COMPREHENSIVE METABOLIC PANEL
ALT: 31 U/L (ref 0–53)
ANION GAP: 3 — AB (ref 5–15)
AST: 55 U/L — ABNORMAL HIGH (ref 0–37)
Albumin: 2.2 g/dL — ABNORMAL LOW (ref 3.5–5.2)
Alkaline Phosphatase: 76 U/L (ref 39–117)
BUN: 29 mg/dL — ABNORMAL HIGH (ref 6–23)
CHLORIDE: 114 mmol/L — AB (ref 96–112)
CO2: 27 mmol/L (ref 19–32)
Calcium: 7.8 mg/dL — ABNORMAL LOW (ref 8.4–10.5)
Creatinine, Ser: 1.23 mg/dL (ref 0.50–1.35)
GFR calc Af Amer: 73 mL/min — ABNORMAL LOW (ref >90)
GFR calc non Af Amer: 63 mL/min — ABNORMAL LOW (ref >90)
Glucose, Bld: 101 mg/dL — ABNORMAL HIGH (ref 70–99)
POTASSIUM: 4.2 mmol/L (ref 3.5–5.1)
Sodium: 144 mmol/L (ref 135–145)
Total Bilirubin: 2.8 mg/dL — ABNORMAL HIGH (ref 0.3–1.2)
Total Protein: 5 g/dL — ABNORMAL LOW (ref 6.0–8.3)

## 2014-08-31 LAB — CULTURE, RESPIRATORY W GRAM STAIN: Special Requests: NORMAL

## 2014-08-31 LAB — GLUCOSE, CAPILLARY
GLUCOSE-CAPILLARY: 100 mg/dL — AB (ref 70–99)
GLUCOSE-CAPILLARY: 96 mg/dL (ref 70–99)
GLUCOSE-CAPILLARY: 161 mg/dL — AB (ref 70–99)
Glucose-Capillary: 106 mg/dL — ABNORMAL HIGH (ref 70–99)
Glucose-Capillary: 214 mg/dL — ABNORMAL HIGH (ref 70–99)
Glucose-Capillary: 95 mg/dL (ref 70–99)

## 2014-08-31 LAB — CBC
HCT: 23.6 % — ABNORMAL LOW (ref 39.0–52.0)
HEMOGLOBIN: 7.7 g/dL — AB (ref 13.0–17.0)
MCH: 32.4 pg (ref 26.0–34.0)
MCHC: 32.6 g/dL (ref 30.0–36.0)
MCV: 99.2 fL (ref 78.0–100.0)
Platelets: 45 10*3/uL — ABNORMAL LOW (ref 150–400)
RBC: 2.38 MIL/uL — AB (ref 4.22–5.81)
RDW: 16.9 % — ABNORMAL HIGH (ref 11.5–15.5)
WBC: 4.1 10*3/uL (ref 4.0–10.5)

## 2014-08-31 LAB — CULTURE, RESPIRATORY

## 2014-08-31 MED ORDER — CLONAZEPAM 0.5 MG PO TABS
0.5000 mg | ORAL_TABLET | Freq: Two times a day (BID) | ORAL | Status: DC
  Administered 2014-08-31 – 2014-09-01 (×3): 0.5 mg via ORAL
  Filled 2014-08-31 (×3): qty 1

## 2014-08-31 MED ORDER — LACTULOSE 10 GM/15ML PO SOLN
30.0000 g | Freq: Two times a day (BID) | ORAL | Status: DC
  Administered 2014-08-31 – 2014-09-01 (×3): 30 g via ORAL
  Filled 2014-08-31 (×4): qty 45

## 2014-08-31 MED ORDER — AMITRIPTYLINE HCL 25 MG PO TABS
25.0000 mg | ORAL_TABLET | Freq: Every day | ORAL | Status: DC
  Administered 2014-08-31 – 2014-09-02 (×3): 25 mg via ORAL
  Filled 2014-08-31 (×4): qty 1

## 2014-08-31 MED ORDER — AMOXICILLIN 250 MG/5ML PO SUSR
500.0000 mg | Freq: Two times a day (BID) | ORAL | Status: DC
  Administered 2014-08-31 – 2014-09-01 (×3): 500 mg
  Filled 2014-08-31 (×4): qty 10

## 2014-08-31 MED ORDER — PHENOL 1.4 % MT LIQD
1.0000 | OROMUCOSAL | Status: DC | PRN
  Filled 2014-08-31 (×2): qty 177

## 2014-08-31 MED ORDER — CHLORHEXIDINE GLUCONATE 0.12 % MT SOLN: 15.0000 mL | OROMUCOSAL | Status: DC | PRN

## 2014-08-31 MED ORDER — AMOXICILLIN 250 MG/5ML PO SUSR
250.0000 mg | Freq: Two times a day (BID) | ORAL | Status: DC
  Filled 2014-08-31 (×2): qty 5

## 2014-08-31 NOTE — Progress Notes (Signed)
PULMONARY / CRITICAL CARE MEDICINE   Name: [name] MRN: [id number] DOB: [birth year]    ADMISSION DATE:  08/29/2014 CONSULTATION DATE:  08/29/2014  REFERRING MD :Jeneen Rinks  CHIEF COMPLAINT:  Altered mental status  INITIAL PRESENTATION:  59 year-old male with known history of alcoholic cirrhosis and noncompliance with lactulose found unresponsive at home on floor 4/8 by home health nurse with TV and desk knocked over. Patient was incontinent, posturing, with erratic eye movements. Intubated in ED. Ammonia level was  210. PCCM to admit.   STUDIES:  4/8 cervical spine - no acute finding  4/8 head CT - no acute finding 4/9 EGD with mild/moderate portal gastropathy, minimal varices, no ulcers   SIGNIFICANT EVENTS: 4/8 - intubated  4/9 - extubated  SUBJECTIVE:  This AM, he reports he would like something to drink. He does not remember what happened before he came in to the hospital. His ex-girlfriend was at bedside and reports helping him with his meds though notes he has been out of rifaximin for some time time. He does not take his lactulose b/c of diarrhea.   VITAL SIGNS: Temp:  [97.9 F (36.6 C)-99.1 F (37.3 C)] 98 F (36.7 C) (04/10 0700) Pulse Rate:  [58-99] 73 (04/10 0700) Resp:  [8-19] 13 (04/10 0700) BP: (107-152)/(52-97) 127/76 mmHg (04/10 0700) SpO2:  [99 %-100 %] 100 % (04/10 0700) FiO2 (%):  [40 %] 40 % (04/09 1135) HEMODYNAMICS:   VENTILATOR SETTINGS: Vent Mode:  [-] PRVC FiO2 (%):  [40 %] 40 % Set Rate:  [18 bmp] 18 bmp Vt Set:  [580 mL] 580 mL PEEP:  [5 cmH20] 5 cmH20 Plateau Pressure:  [12 cmH20] 12 cmH20 INTAKE / OUTPUT:  Intake/Output Summary (Last 24 hours) at 08/31/14 0805 Last data filed at 08/31/14 0700  Gross per 24 hour  Intake    900 ml  Output    970 ml  Net    -70 ml    PHYSICAL EXAMINATION: General:  Malodorous Caucasian male, tearful Neuro:  PERRLA, follows commands, responds appropriately to questions, oriented to place but not month or  year  HEENT:  NCAT, dry mucous membranes Cardiovascular:  S1, S2. 2/6 murmur left sternal border Lungs:  Rhonchi throughout.  Abdomen:  Soft, bowel sounds present. Mild tenderness to palpation overlying LUQ.   Skin: Chronic skin changes of BLE below knees. SCDs in place in BLE.  LABS:  CBC  Recent Labs Lab 08/30/14 0253 08/30/14 0737 08/30/14 1330 08/30/14 1830 08/31/14 0622  WBC 7.1 7.2  --   --  4.1  HGB 8.4* 8.4* 8.2* 8.2* 7.7*  HCT 24.8* 25.5* 24.8* 24.8* 23.6*  PLT 69* 59*  --   --  45*   Coag's  Recent Labs Lab 08/29/14 1126  INR 1.48   BMET  Recent Labs Lab 08/29/14 1349 08/30/14 0253 08/31/14 0622  NA 142 145 144  K 4.1 3.8 4.2  CL 111 113* 114*  CO2 17* 20 27  BUN 36* 38* 29*  CREATININE 1.38* 1.32 1.23  GLUCOSE 191* 138* 101*   Electrolytes  Recent Labs Lab 08/29/14 1349 08/30/14 0253 08/31/14 0622  CALCIUM 8.3* 8.1* 7.8*  MG  --  2.0  --   PHOS  --  2.9  --    Sepsis Markers  Recent Labs Lab 08/29/14 1349 08/29/14 1844 08/29/14 2315  LATICACIDVEN 7.0* 3.7* 2.1*  PROCALCITON <0.10  --   --    ABG  Recent Labs Lab 08/29/14 1414 08/29/14 1423 08/30/14  0400  PHART 7.408 7.433 7.482*  PCO2ART 27.4* 28.2* 31.0*  PO2ART 141.0* 126.0* 128.0*   Liver Enzymes  Recent Labs Lab 08/29/14 1126 08/31/14 0622  AST 61* 55*  ALT 39 31  ALKPHOS 106 76  BILITOT 3.5* 2.8*  ALBUMIN 3.0* 2.2*   Cardiac Enzymes  Recent Labs Lab 08/29/14 1126  TROPONINI <0.03   Glucose  Recent Labs Lab 08/30/14 0746 08/30/14 1202 08/30/14 1546 08/30/14 1953 08/30/14 2357 08/31/14 0413  GLUCAP 124* 138* 111* 93 106* 100*    Imaging Ct Head Wo Contrast  08/29/2014   CLINICAL DATA:  Patient found unresponsive on the floor today.  EXAM: CT HEAD WITHOUT CONTRAST  CT CERVICAL SPINE WITHOUT CONTRAST  TECHNIQUE: Multidetector CT imaging of the head and cervical spine was performed following the standard protocol without intravenous contrast.  Multiplanar CT image reconstructions of the cervical spine were also generated.  COMPARISON:  Head CT scan 08/14/2014. Head and cervical spine CT scan 01/03/2004.  FINDINGS: CT HEAD FINDINGS  Cortical atrophy and chronic microvascular ischemic change are again seen. No evidence of acute intracranial abnormality including hemorrhage, infarct, mass lesion, mass effect, midline shift abnormal extra-axial fluid collection is identified. There is no hydrocephalus or pneumocephalus. Small left mastoid effusion is unchanged.  CT CERVICAL SPINE FINDINGS  There is no fracture or malalignment of the cervical spine. Intervertebral disc space height is maintained. Endotracheal tube and NG tube are in place. Lung apices are clear. Paraspinous soft tissue structures are unremarkable. Carotid atherosclerosis is noted.  IMPRESSION: No acute finding head or cervical spine.  Atrophy and chronic microvascular ischemic change.   Electronically Signed   By: Inge Rise M.D.   On: 08/29/2014 12:23   Ct Cervical Spine Wo Contrast  08/29/2014   CLINICAL DATA:  Patient found unresponsive on the floor today.  EXAM: CT HEAD WITHOUT CONTRAST  CT CERVICAL SPINE WITHOUT CONTRAST  TECHNIQUE: Multidetector CT imaging of the head and cervical spine was performed following the standard protocol without intravenous contrast. Multiplanar CT image reconstructions of the cervical spine were also generated.  COMPARISON:  Head CT scan 08/14/2014. Head and cervical spine CT scan 01/03/2004.  FINDINGS: CT HEAD FINDINGS  Cortical atrophy and chronic microvascular ischemic change are again seen. No evidence of acute intracranial abnormality including hemorrhage, infarct, mass lesion, mass effect, midline shift abnormal extra-axial fluid collection is identified. There is no hydrocephalus or pneumocephalus. Small left mastoid effusion is unchanged.  CT CERVICAL SPINE FINDINGS  There is no fracture or malalignment of the cervical spine. Intervertebral  disc space height is maintained. Endotracheal tube and NG tube are in place. Lung apices are clear. Paraspinous soft tissue structures are unremarkable. Carotid atherosclerosis is noted.  IMPRESSION: No acute finding head or cervical spine.  Atrophy and chronic microvascular ischemic change.   Electronically Signed   By: Inge Rise M.D.   On: 08/29/2014 12:23   Dg Chest Port 1 View  08/29/2014   CLINICAL DATA:  Respiratory failure.  EXAM: PORTABLE CHEST - 1 VIEW  COMPARISON:  08/29/2014 at 11:28 a.m.  FINDINGS: Endotracheal tube with tip 4.2 cm above the carina. Enteric tube courses into the region of the stomach and off the inferior portion of the film. Interval placement of left-sided catheter over the left neck extending inferiorly following a course more consistent with arterial location left of midline below the level of the carina possibly within the descending thoracic aorta or proximal ascending thoracic aorta.  Lungs are hypoinflated. No evidence  of left pneumothorax. Mild prominence of the perihilar markings suggesting minimal vascular congestion. Cardiomediastinal silhouette and remainder of the exam is unchanged.  IMPRESSION: Hypoinflation with suggestion of mild vascular congestion.  Tubes and lines as described. Note that the left sided vascular catheter appears to take a course more consistent with arterial location possibly within the proximal ascending aorta or descending aorta. No pneumothorax.  Critical Value/emergent results were called by telephone at the time of interpretation on 08/29/2014 at 2:06 pm to Dr. Tanna Furry, who verbally acknowledged these results.   Electronically Signed   By: Marin Olp M.D.   On: 08/29/2014 14:06   Dg Chest Port 1 View  08/29/2014   CLINICAL DATA:  Unresponsive.  EXAM: PORTABLE CHEST - 1 VIEW  COMPARISON:  Radiograph 08/15/2014  FINDINGS: Endotracheal tube approximately 5.8 cm from carina. NG tube in stomach. No effusion, infiltrate, pneumothorax. Low  lung volumes.  IMPRESSION: Endotracheal tube in appropriate position.  Low lung volumes.   Electronically Signed   By: Suzy Bouchard M.D.   On: 08/29/2014 11:41    ASSESSMENT / PLAN:  PULMONARY A: Acute respiratory failure due to altered mental status  H/o COPD Risk for aspiration pneumonia: CXR only with mild bibasilar atelectasis P:   Full vent support VAP bundle  CARDIOVASCULAR A: CHF: diastolic dysfunction grade 1 per echo March 2016 H/o hypertension P:  Maintain normotension, goal MAP >65 Continue IVFs   RENAL A: AKI: improving with IV hydration AG met acidosis, resolved P:   Volume resuscitation  GASTROINTESTINAL A: Alcoholic cirrhosis Elevated ammonia GI bleed P:   Lactulose, Rifaximin when able to use stomach Discontinued ceftriaxone given small amount of GI bleeding Protonix, octreotide  Advanced diet to clears GI following, appreciate recs  HEMATOLOGIC A: Thrombocytopenia Anemia from GI bleed: Hb stable at 8 P:  Trend CBC VTE prophylaxis - SCDs  INFECTIOUS A: Risk for aspiration pneumonia P:   BCx2 4/8 >> Urine culture 4/8 >>Enterococcus Sputum 4/8 >>Gram+/- rods  Consider starting PO abx tomorrow, likely amoxicillin x 3 days to complete total 5-day course.   ENDOCRINE A: Diabetes mellitus P:   Trend CBG.  SSI.  NEUROLOGIC A: Acute hepatic encephalopathy in setting of elevated ammonia  Substance abuse P:   RASS goal: -1  Monitor for withdrawal  CLINICAL SUMMARY: 59 year old male with DM2, COPD, alcoholic cirrhosis complicated by polysubstance abuse who presented with acute hepatic encephalopathy and upper GI bleeding likely 2/2 trauma from tube placement. Stable for transfer to stepdown today.  Charlott Rakes, PGY1 Internal Medicine Pager: [phone]  08/31/14 10:49 AM  Reviewed above and examined.  He c/o sore throat.  Otherwise no issues after extubation.  He is depressed about this health issues.  He denies chest  pain/abd pain.  Alert, follows commands, chest clear, abd soft.  F/u Hb, advance diet per GI, protonix/octreotide per GI, restart elavil/klonopin/lactulose.  Transfer to SDU 4/10.  Will ask [facility] to assume care from 4/11 and PCCM sign off.  Chesley Mires, MD [facility] Pulmonary/Critical Care 08/31/2014, 12:02 PM Pager:  [phone] After 3pm call: [phone]

## 2014-08-31 NOTE — Progress Notes (Addendum)
Patient ID: [name], male   DOB: 04/11/1956, 59 y.o.   MRN: [id number] [facility] Gastroenterology Progress Note  [name] 59 y.o. [birth year]   Subjective: Extubated. Wants to eat. Denies abdominal pain. No further bleeding.  Objective: Vital signs in last 24 hours: Filed Vitals:   08/31/14 1000  BP: 121/77  Pulse: 53  Temp: 97.9 F (36.6 C)  Resp: 11    Physical Exam: Gen: lethargic, no acute distress CV: RRR Chest: clear anteriorly Abd: soft, nontender, nondistended, +BS  Lab Results:  Recent Labs  08/30/14 0253 08/31/14 0622  NA 145 144  K 3.8 4.2  CL 113* 114*  CO2 20 27  GLUCOSE 138* 101*  BUN 38* 29*  CREATININE 1.32 1.23  CALCIUM 8.1* 7.8*  MG 2.0  --   PHOS 2.9  --     Recent Labs  08/29/14 1126 08/31/14 0622  AST 61* 55*  ALT 39 31  ALKPHOS 106 76  BILITOT 3.5* 2.8*  PROT 6.7 5.0*  ALBUMIN 3.0* 2.2*    Recent Labs  08/29/14 1126  08/30/14 0737  08/30/14 1830 08/31/14 0622  WBC 5.2  < > 7.2  --   --  4.1  NEUTROABS 4.3  --   --   --   --   --   HGB 11.1*  < > 8.4*  < > 8.2* 7.7*  HCT 33.1*  < > 25.5*  < > 24.8* 23.6*  MCV 95.7  < > 96.2  --   --  99.2  PLT 72*  < > 59*  --   --  45*  < > = values in this interval not displayed.  Recent Labs  08/29/14 1126  LABPROT 18.1*  INR 1.48      Assessment/Plan: S/P upper GI bleed from portal gastropathy that has resolved. Slight decrease in Hgb without any further bleeding. Advance diet. D/C Octreotide. Continue IV PPI Q 12 hours until on POs and then change to PO BID for 4 weeks. Avoid NSAIDs. F/U with GI prn. Patient reports that he stopped drinking alcohol 3 years ago. Will sign off. Call if questions. Dr. Watt Climes available to see this week if needed.   [place] C. 08/31/2014, 12:50 PM

## 2014-09-01 ENCOUNTER — Encounter (HOSPITAL_COMMUNITY): Payer: Self-pay | Admitting: Gastroenterology

## 2014-09-01 DIAGNOSIS — R401 Stupor: Secondary | ICD-10-CM

## 2014-09-01 DIAGNOSIS — K7031 Alcoholic cirrhosis of liver with ascites: Secondary | ICD-10-CM

## 2014-09-01 LAB — CBC
HEMATOCRIT: 26 % — AB (ref 39.0–52.0)
Hemoglobin: 8.5 g/dL — ABNORMAL LOW (ref 13.0–17.0)
MCH: 31.7 pg (ref 26.0–34.0)
MCHC: 32.7 g/dL (ref 30.0–36.0)
MCV: 97 fL (ref 78.0–100.0)
Platelets: 44 10*3/uL — ABNORMAL LOW (ref 150–400)
RBC: 2.68 MIL/uL — ABNORMAL LOW (ref 4.22–5.81)
RDW: 16.1 % — ABNORMAL HIGH (ref 11.5–15.5)
WBC: 3.7 10*3/uL — AB (ref 4.0–10.5)

## 2014-09-01 LAB — URINE CULTURE
Colony Count: >=100000
Special Requests: NORMAL

## 2014-09-01 LAB — BASIC METABOLIC PANEL
ANION GAP: 4 — AB (ref 5–15)
BUN: 19 mg/dL (ref 6–23)
CHLORIDE: 112 mmol/L (ref 96–112)
CO2: 23 mmol/L (ref 19–32)
CREATININE: 0.95 mg/dL (ref 0.50–1.35)
Calcium: 7.7 mg/dL — ABNORMAL LOW (ref 8.4–10.5)
GFR calc non Af Amer: >90 mL/min (ref >90)
Glucose, Bld: 123 mg/dL — ABNORMAL HIGH (ref 70–99)
Potassium: 3.9 mmol/L (ref 3.5–5.1)
Sodium: 139 mmol/L (ref 135–145)

## 2014-09-01 LAB — GLUCOSE, CAPILLARY
GLUCOSE-CAPILLARY: 136 mg/dL — AB (ref 70–99)
Glucose-Capillary: 115 mg/dL — ABNORMAL HIGH (ref 70–99)
Glucose-Capillary: 145 mg/dL — ABNORMAL HIGH (ref 70–99)
Glucose-Capillary: 146 mg/dL — ABNORMAL HIGH (ref 70–99)
Glucose-Capillary: 159 mg/dL — ABNORMAL HIGH (ref 70–99)
Glucose-Capillary: 93 mg/dL (ref 70–99)

## 2014-09-01 MED ORDER — CLONAZEPAM 0.5 MG PO TABS: 0.5000 mg | ORAL_TABLET | Freq: Two times a day (BID) | ORAL | Status: DC

## 2014-09-01 MED ORDER — AMITRIPTYLINE HCL 25 MG PO TABS: 25.0000 mg | ORAL_TABLET | Freq: Every day | ORAL | Status: DC

## 2014-09-01 MED ORDER — OXYCODONE HCL 5 MG PO TABS
5.0000 mg | ORAL_TABLET | Freq: Three times a day (TID) | ORAL | Status: DC | PRN
Start: 2014-09-01 — End: 2014-09-03
  Administered 2014-09-02 – 2014-09-03 (×3): 5 mg via ORAL
  Filled 2014-09-01 (×3): qty 1

## 2014-09-01 MED ORDER — CLONAZEPAM 0.5 MG PO TABS
0.2500 mg | ORAL_TABLET | Freq: Two times a day (BID) | ORAL | Status: DC
  Administered 2014-09-01 – 2014-09-03 (×4): 0.25 mg via ORAL
  Filled 2014-09-01 (×4): qty 1

## 2014-09-01 MED ORDER — INSULIN ASPART 100 UNIT/ML ~~LOC~~ SOLN
0.0000 [IU] | Freq: Three times a day (TID) | SUBCUTANEOUS | Status: DC
  Administered 2014-09-01: 1 [IU] via SUBCUTANEOUS
  Administered 2014-09-02 (×3): 2 [IU] via SUBCUTANEOUS

## 2014-09-01 MED ORDER — AMOXICILLIN 250 MG/5ML PO SUSR
500.0000 mg | Freq: Two times a day (BID) | ORAL | Status: DC
  Administered 2014-09-01 – 2014-09-03 (×4): 500 mg via ORAL
  Filled 2014-09-01 (×7): qty 10

## 2014-09-01 MED ORDER — FUROSEMIDE 40 MG PO TABS
40.0000 mg | ORAL_TABLET | Freq: Every day | ORAL | Status: DC
  Administered 2014-09-01 – 2014-09-03 (×3): 40 mg via ORAL
  Filled 2014-09-01 (×3): qty 1

## 2014-09-01 MED ORDER — CETYLPYRIDINIUM CHLORIDE 0.05 % MT LIQD
7.0000 mL | Freq: Two times a day (BID) | OROMUCOSAL | Status: DC
  Administered 2014-09-01 (×2): 7 mL via OROMUCOSAL

## 2014-09-01 MED ORDER — LACTULOSE 10 GM/15ML PO SOLN
30.0000 g | Freq: Three times a day (TID) | ORAL | Status: DC
  Administered 2014-09-01 – 2014-09-03 (×6): 30 g via ORAL
  Filled 2014-09-01 (×8): qty 45

## 2014-09-01 MED ORDER — PANTOPRAZOLE SODIUM 40 MG PO TBEC
40.0000 mg | DELAYED_RELEASE_TABLET | Freq: Two times a day (BID) | ORAL | Status: DC
  Administered 2014-09-01 – 2014-09-03 (×4): 40 mg via ORAL
  Filled 2014-09-01 (×4): qty 1

## 2014-09-01 MED ORDER — OXYCODONE HCL 5 MG PO TABS
5.0000 mg | ORAL_TABLET | Freq: Four times a day (QID) | ORAL | Status: DC | PRN
  Administered 2014-09-01: 5 mg via ORAL
  Filled 2014-09-01: qty 1

## 2014-09-01 NOTE — Care Management Note (Signed)
  Page 1 of 1   09/03/2014     8:14:43 AM CARE MANAGEMENT NOTE 09/03/2014  Patient:  [name], [name]   Account Number:  [id number]  Date Initiated:  09/01/2014  Documentation initiated by:  Luz Lex  Subjective/Objective Assessment:   Admitted with AMS - found down, posturing.  h/o Cirrhosis and noncompliance with ETOH and lactulose.     Action/Plan:   Anticipated DC Date:  09/03/2014   Anticipated DC Plan:  SKILLED NURSING FACILITY  In-house referral  Clinical Social Worker      DC Planning Services  CM consult      Choice offered to / List presented to:             Status of service:  In process, will continue to follow Medicare Important Message given?  YES (If response is "NO", the following Medicare IM given date fields will be blank) Date Medicare IM given:  09/01/2014 Medicare IM given by:  [facility] Date Additional Medicare IM given:   Additional Medicare IM given by:    Discharge Disposition:    Per UR Regulation:  Reviewed for med. necessity/level of care/duration of stay  If discussed at [place] of Stay Meetings, dates discussed:    Comments:  Contact:  Gregory,Norma Significant other [phone]  [phone]   09-03-14 Spoke to Joellen Jersey with [facility] 09-02-14 , advanced unable to resume services with patient . Advanced will call patient and explain same to him. Magdalen Spatz RN BSN  09-01-14 10:50am Luz Lex, RNBSN [phone] Last admission was discharged home with a [facility] with [place]. Will need resumption of services on discharge.

## 2014-09-01 NOTE — Progress Notes (Addendum)
[facility] TEAM 1 - Stepdown/ICU TEAM Progress Note  TION TSE BJY:[id number] DOB: 1955/07/10 DOA: 08/29/2014 PCP: Barbette Merino, MD  Admit HPI / Brief Narrative: 59 year-old male with history of alcoholic cirrhosis and noncompliance with lactulose found unresponsive in the floor at home 4/8 by home health nurse. Patient was incontinent and posturing, with erratic eye movements. He was intubated in the ED. Ammonia level was 210. PCCM admitted the pt to the ICU.    Significant Events: 4/8 intubated 4/9 extubated 4/9 EGD - Mild to moderate portal gastropathy with small amount of active bleeding likely due to NGT suction - Minimal nonbleeding esophageal varices  HPI/Subjective: Pt is alert but confused.  He thinks he is here for tx of a pneumonia.  He c/o "bad neuropathy pain."  He denies cp, sob, n/v, or abdom pain.    Assessment/Plan:  Acute respiratory failure due to altered mental status  Resolved - appears to have been due to hepatic encephalopathy + benzo sedation   Alcoholic cirrhosis w/ hepatic encephalopathy - obtundation Pt had an almost identical admit in March 2016 - ammonia 210 at admission - compliance is the issue - explained in very frank terms that failure to manage his ammonia level will lead to his early death if this pattern continues - f/u ammonia level and titrate lactulose as needed - BP currently too low to allow BB  GI bleed Due to portal gastropathy - now off octreotide - continue IV PPI Q 12 hours until on POs and then change to PO BID for 4 weeks per GI - avoid NSAIDs  Acute blood loss anemia on chronic anemia due to cirrhosis  Hgb climbing - follow trend   Thrombocytopenia Secondary to liver cirrhosis - stable at ~45k  AKI Renal function has normalized with IV hydration  Substance abuse UDS + for benzos and cocaine at admit - Rx benzos had been stopped at time of d/c 08/18/14 - will NOT prescribe narcotics or benzos at time of d/c, and suggest his PCP  also NOT prescribe these meds in f/u   COPD Well compensated at present   Enterococcus UTI Colony count now >100K so tx underway   DM2 CBG reasonably controlled at present   Diastolic CHF grade 1 per echo March 2016 Appears modestly volume overloaded at this time - adjust diuretic - follow Is/Os   Hx bipolar d/o  Code Status: FULL Family Communication: spoke w/ "friend" at bedside  Disposition Plan: transfer to medical bed - titrate lactulose - titrate diuretic - PT/OT - ?if pt safe to live alone   Consultants: PCCM GI - [facility]   Antibiotics: Rocephin 4/8 > 4/9 Amoxicillin 4/10 >  DVT prophylaxis: SCDs  Objective: Blood pressure 104/62, pulse 65, temperature 98.4 F (36.9 C), temperature source Core (Comment), resp. rate 12, height 5\' 10"  (1.778 m), weight 101.5 kg (223 lb 12.3 oz), SpO2 98 %.  Intake/Output Summary (Last 24 hours) at 09/01/14 0901 Last data filed at 09/01/14 0800  Gross per 24 hour  Intake   1195 ml  Output   1425 ml  Net   -230 ml    Exam: General: No acute respiratory distress - alert but confused  Lungs: Clear to auscultation bilaterally without wheezes or crackles Cardiovascular: Regular rate and rhythm without murmur gallop or rub normal S1 and S2 Abdomen: Nontender, soft, bowel sounds positive, no rebound, ~1+ ascites, no appreciable mass Extremities: No significant cyanosis, or clubbing; 2+ edema bilateral lower extremities  Data Reviewed: Basic  Metabolic Panel:  Recent Labs Lab 08/29/14 1126 08/29/14 1349 08/30/14 0253 08/31/14 0622 09/01/14 0502  NA 141 142 145 144 139  K 4.9 4.1 3.8 4.2 3.9  CL 109 111 113* 114* 112  CO2 17* 17* 20 27 23   GLUCOSE 203* 191* 138* 101* 123*  BUN 36* 36* 38* 29* 19  CREATININE 1.65* 1.38* 1.32 1.23 0.95  CALCIUM 8.5 8.3* 8.1* 7.8* 7.7*  MG  --   --  2.0  --   --   PHOS  --   --  2.9  --   --     Liver Function Tests:  Recent Labs Lab 08/29/14 1126 08/31/14 0622  AST 61* 55*  ALT 39  31  ALKPHOS 106 76  BILITOT 3.5* 2.8*  PROT 6.7 5.0*  ALBUMIN 3.0* 2.2*    Recent Labs Lab 08/29/14 1349  LIPASE 35    Recent Labs Lab 08/29/14 1126 08/30/14 0235  AMMONIA 210* 104*    Coags:  Recent Labs Lab 08/29/14 1126  INR 1.48    CBC:  Recent Labs Lab 08/29/14 1126 08/29/14 1349 08/30/14 0253 08/30/14 0737 08/30/14 1330 08/30/14 1830 08/31/14 0622 09/01/14 0502  WBC 5.2 4.6 7.1 7.2  --   --  4.1 3.7*  NEUTROABS 4.3  --   --   --   --   --   --   --   HGB 11.1* 9.4* 8.4* 8.4* 8.2* 8.2* 7.7* 8.5*  HCT 33.1* 27.2* 24.8* 25.5* 24.8* 24.8* 23.6* 26.0*  MCV 95.7 93.8 95.4 96.2  --   --  99.2 97.0  PLT 72* 57* 69* 59*  --   --  45* 44*    Cardiac Enzymes:  Recent Labs Lab 08/29/14 1126  CKTOTAL 172  TROPONINI <0.03    CBG:  Recent Labs Lab 08/31/14 1605 08/31/14 1957 08/31/14 2334 09/01/14 0415 09/01/14 0833  GLUCAP 214* 161* 146* 115* 136*    Recent Results (from the past 240 hour(s))  Culture, blood (routine x 2)     Status: None (Preliminary result)   Collection Time: 08/29/14 11:26 AM  Result Value Ref Range Status   Specimen Description BLOOD LEFT ANTECUBITAL  Final   Special Requests BOTTLES DRAWN AEROBIC ONLY 2CC  Final   Culture   Final           BLOOD CULTURE RECEIVED NO GROWTH TO DATE CULTURE WILL BE HELD FOR 5 DAYS BEFORE ISSUING A FINAL NEGATIVE REPORT Performed at [facility]    Report Status PENDING  Incomplete  Culture, blood (routine x 2)     Status: None (Preliminary result)   Collection Time: 08/29/14  1:49 PM  Result Value Ref Range Status   Specimen Description BLOOD LEFT NECK  Final   Special Requests BOTTLES DRAWN AEROBIC AND ANAEROBIC 5CC LEFT IJ  Final   Culture   Final           BLOOD CULTURE RECEIVED NO GROWTH TO DATE CULTURE WILL BE HELD FOR 5 DAYS BEFORE ISSUING A FINAL NEGATIVE REPORT Performed at [facility]    Report Status PENDING  Incomplete  Urine culture     Status: None  (Preliminary result)   Collection Time: 08/29/14  4:15 PM  Result Value Ref Range Status   Specimen Description URINE, RANDOM  Final   Special Requests Normal  Final   Colony Count   Final    >=100,000 COLONIES/ML Performed at [facility]  Final    ENTEROCOCCUS SPECIES Performed at [facility]    Report Status PENDING  Incomplete  MRSA PCR Screening     Status: None   Collection Time: 08/29/14  4:15 PM  Result Value Ref Range Status   MRSA by PCR NEGATIVE NEGATIVE Final    Comment:        The GeneXpert MRSA Assay (FDA approved for NASAL specimens only), is one component of a comprehensive MRSA colonization surveillance program. It is not intended to diagnose MRSA infection nor to guide or monitor treatment for MRSA infections.   Culture, respiratory (tracheal aspirate)     Status: None   Collection Time: 08/29/14  4:20 PM  Result Value Ref Range Status   Specimen Description TRACHEAL ASPIRATE  Final   Special Requests Normal  Final   Gram Stain   Final    FEW WBC PRESENT,BOTH PMN AND MONONUCLEAR RARE SQUAMOUS EPITHELIAL CELLS PRESENT ABUNDANT GRAM POSITIVE RODS ABUNDANT GRAM NEGATIVE RODS Performed at [facility]    Culture   Final    Non-Pathogenic Oropharyngeal-type Flora Isolated. Performed at [facility]    Report Status 08/31/2014 FINAL  Final     Studies:   Recent x-ray studies have been reviewed in detail by the Attending Physician  Scheduled Meds:  Scheduled Meds: . amitriptyline  25 mg Oral QHS  . amoxicillin  500 mg Per Tube Q12H  . antiseptic oral rinse  7 mL Mouth Rinse BID  . clonazePAM  0.5 mg Oral BID  . insulin aspart  0-9 Units Subcutaneous 6 times per day  . lactulose  30 g Oral BID  . pantoprazole (PROTONIX) IV  40 mg Intravenous Q12H    Time spent on care of this patient: 35 mins   MCCLUNG,JEFFREY T , MD   [facility] Office  [phone] Pager - Text Page per Shea Evans as per  below:  On-Call/Text Page:      [email or web address]      password [email or web address]  If 7PM-7AM, please contact night-coverage [email or web address] Password [email or web address] 09/01/2014, 9:01 AM   LOS: 3 days

## 2014-09-01 NOTE — Clinical Documentation Improvement (Signed)
Supporting Information: Labs: Lactic acid: 4/08:  7.0. 4/08:  10.58.    Treatment: 4/09:  Rocephin 1 gm IV q 24 hr per Pharmacy.    Possible Clinical Condition: . Bacteremia (positive blood cultures only) . Sepsis with UTI, versus UTI only . Sepsis-specify causative organism if known . Sepsis due to: --Device --Implant --Graft --Infusion . Severe sepsis-sepsis with organ dysfunction --Specify organ dysfunction Respiratory failure Encephalopathy Acute kidney failure Other (specify) . SIRS (Systemic Inflammatory Response Syndrome --With or without organ dysfunction . Document septic shock if present . Document any associated diagnoses/conditions      Thank Sherian Maroon Documentation Specialist [phone] Serenidy [email or web address]

## 2014-09-01 NOTE — Evaluation (Addendum)
Physical Therapy Evaluation Patient Details Name: [name] MRN: [id number] DOB: 04-17-1956 Today's Date: 09/01/2014   History of Present Illness  Patient is a 89 Admitted with AMS - found down, posturing. h/o Cirrhosis and noncompliance with ETOH and lactulose. Recently admitted 08/14/14 as well as  3/7. PMH:  alcoholic cirrhosis, DM, HTN, CVA, COPD, polysubstance abuse, bipolar disorder, depression  Clinical Impression  Patient demonstrates deficits in functional mobility as indicated below. Will need continued skilled PT to address deficits and maximize function. Will see as indicated and progress as tolerated. OF NOTE patient very high fall risk, multiple admissions 3 in one month. Feel patient may benefit from [facility] upon acute discharge.    Follow Up Recommendations SNF    Equipment Recommendations   (TBD)    Recommendations for Other Services       Precautions / Restrictions Precautions Precautions: Fall Restrictions Weight Bearing Restrictions: No      Mobility  Bed Mobility Overal bed mobility: Needs Assistance Bed Mobility: Supine to Sit;Sit to Supine     Supine to sit: Mod assist Sit to supine: Min assist   General bed mobility comments: VCs for technique and sequencing, max cues to direct to task, assist to elevate trunk and rotate to EOB, patient stopping in the middle of mobility.  Transfers Overall transfer level: Needs assistance Equipment used: None Transfers: Sit to/from Stand Sit to Stand: Min assist         General transfer comment: VCs for hand placement, non-compliance, assist for stability with posterior loss of balance x3, Cues for safety  Ambulation/Gait Ambulation/Gait assistance: Min assist Ambulation Distance (Feet): 20 Feet Assistive device: Rolling walker (2 wheeled) Gait Pattern/deviations: Step-through pattern;Decreased stride length;Shuffle;Ataxic     General Gait Details: instbaility noted with ambulation, VCs for controlled  use of AD, assist for stability, cues to direct to task  Stairs            Wheelchair Mobility    Modified Rankin (Stroke Patients Only)       Balance           Standing balance support: Bilateral upper extremity supported Standing balance-Leahy Scale: Fair                               Pertinent Vitals/Pain Pain Assessment: No/denies pain    Home Living Family/patient expects to be discharged to:: Private residence Living Arrangements: Alone   Type of Home: Apartment Home Access: Level entry     Home Layout: One level Home Equipment: Walker - 2 wheels;Bedside commode Additional Comments: patient states that he lives by himself and that he has no steps to get in, he can just walk under the bridge    Prior Function Level of Independence: Independent with assistive device(s);Needs assistance   Gait / Transfers Assistance Needed: Uses RW  ADL's / Homemaking Assistance Needed: Per chart, patient had aide to assist with ADL's  Comments: Aide was setup last admission (approx 1 week ago)     Hand Dominance   Dominant Hand: Right    Extremity/Trunk Assessment   Upper Extremity Assessment: Generalized weakness           Lower Extremity Assessment: Generalized weakness (history of peripheral neuropathy per patient)         Communication   Communication: No difficulties  Cognition Arousal/Alertness: Awake/alert Behavior During Therapy: WFL for tasks assessed/performed Overall Cognitive Status: No family/caregiver present to determine baseline cognitive  functioning Area of Impairment: Attention;Memory;Safety/judgement;Awareness;Problem solving   Current Attention Level: Sustained Memory: Decreased short-term memory   Safety/Judgement: Decreased awareness of safety Awareness: Intellectual Problem Solving: Slow processing;Decreased initiation;Difficulty sequencing;Requires verbal cues      General Comments      Exercises         Assessment/Plan    PT Assessment Patient needs continued PT services  PT Diagnosis Difficulty walking;Generalized weakness;Acute pain;Altered mental status   PT Problem List Decreased strength;Decreased activity tolerance;Decreased balance;Decreased mobility;Decreased cognition;Decreased knowledge of use of DME;Decreased safety awareness;Pain  PT Treatment Interventions DME instruction;Gait training;Functional mobility training;Therapeutic activities;Therapeutic exercise;Cognitive remediation;Patient/family education   PT Goals (Current goals can be found in the Care Plan section) Acute Rehab PT Goals Patient Stated Goal: None stated PT Goal Formulation: With patient Time For Goal Achievement: 09/15/14 Potential to Achieve Goals: Good    Frequency Min 3X/week   Barriers to discharge Decreased caregiver support      Co-evaluation               End of Session Equipment Utilized During Treatment: Gait belt Activity Tolerance: Patient limited by fatigue Patient left: in bed;with call bell/phone within reach;with bed alarm set Nurse Communication: Mobility status         Time: 3419-6222 PT Time Calculation (min) (ACUTE ONLY): 19 min   Charges:   PT Evaluation $Initial PT Evaluation Tier I: 1 Procedure     PT G CodesDuncan Dull Sep 22, 2014, 1:56 PM Alben Deeds, [place] DPT  [phone]

## 2014-09-01 NOTE — Progress Notes (Signed)
Received call from [facility] that the [facility] providers that had been going into the home had concerns that patient had drug and ETOH use in the home by the patient, repeated noncompliance w/ medications and instructions.  They question his safety to return home and the support that patient had reported to have (girlfriend) is not visiting the patient as often as the patient said she would at discharge. [facility] has attempted to do a CSW visit to evaluate the home situation and the patient has repeatedly turned them away and asked the CSW to return at another time.  Therapies are recommending SNF right now.  [facility] has not said for certain that they will not take him back but they will need to reassess closer to time for pt to d/c.   Sandi Mariscal, RN BSN MHA CCM  Case Manager, Trauma Service/Unit 47M [phone]

## 2014-09-01 NOTE — Progress Notes (Signed)
Report called to nurse Mendel Ryder. Patient is alert. Belongings gathered and will be transported with patient.

## 2014-09-01 NOTE — Clinical Documentation Improvement (Signed)
Supporting Information: Anemia from chronic GIB per 4/09 progress notes. Anemia of chronic disease per 4/08 progress notes.   Labs:  H/H: 4/08:  11.1/33.1. 4/09:   8.2/24.8. 4/10:   7.7/23.6.   Treatment: Continue IV Protonix and Octreotide drip per 4/09 progress notes.     Possible Clinical Condition: . Documentation of Anemia should include the type of anemia: --Nutritional --Acute Blood Loss Anemia --Acute on Chronic Blood Loss Anemia --Other (please specify) . Include in documentation if Anemia is due to nutrition or mineral deficits, resulting in a nutritional anemia . Document if the Anemia is due to a neoplasm (primary and/or secondary) . Document whether the ANEMIA is "related to or due to" chemo or radiotherapy treatments . Document any "cause-and-effect" relationship between the intervention and the blood or immune disorder . Document the specific drug if anemia is drug-induced . Link any laboratory findings to a related diagnosis (if appropriate) . Document any associated diagnoses/conditions     Thank Sherian Maroon Documentation Specialist [phone] [email or web address] [email or web address]

## 2014-09-02 DIAGNOSIS — D62 Acute posthemorrhagic anemia: Secondary | ICD-10-CM | POA: Insufficient documentation

## 2014-09-02 DIAGNOSIS — N390 Urinary tract infection, site not specified: Secondary | ICD-10-CM

## 2014-09-02 DIAGNOSIS — J9690 Respiratory failure, unspecified, unspecified whether with hypoxia or hypercapnia: Secondary | ICD-10-CM

## 2014-09-02 DIAGNOSIS — I5041 Acute combined systolic (congestive) and diastolic (congestive) heart failure: Secondary | ICD-10-CM | POA: Insufficient documentation

## 2014-09-02 DIAGNOSIS — N179 Acute kidney failure, unspecified: Secondary | ICD-10-CM | POA: Insufficient documentation

## 2014-09-02 DIAGNOSIS — K7030 Alcoholic cirrhosis of liver without ascites: Secondary | ICD-10-CM | POA: Insufficient documentation

## 2014-09-02 LAB — GLUCOSE, CAPILLARY
GLUCOSE-CAPILLARY: 124 mg/dL — AB (ref 70–99)
GLUCOSE-CAPILLARY: 151 mg/dL — AB (ref 70–99)
GLUCOSE-CAPILLARY: 142 mg/dL — AB (ref 70–99)
Glucose-Capillary: 157 mg/dL — ABNORMAL HIGH (ref 70–99)

## 2014-09-02 LAB — COMPREHENSIVE METABOLIC PANEL
ALBUMIN: 2.4 g/dL — AB (ref 3.5–5.2)
ALK PHOS: 92 U/L (ref 39–117)
ALT: 31 U/L (ref 0–53)
AST: 61 U/L — AB (ref 0–37)
Anion gap: 8 (ref 5–15)
BILIRUBIN TOTAL: 2.3 mg/dL — AB (ref 0.3–1.2)
BUN: 14 mg/dL (ref 6–23)
CO2: 24 mmol/L (ref 19–32)
Calcium: 7.9 mg/dL — ABNORMAL LOW (ref 8.4–10.5)
Chloride: 105 mmol/L (ref 96–112)
Creatinine, Ser: 0.94 mg/dL (ref 0.50–1.35)
GFR calc Af Amer: >90 mL/min (ref >90)
GFR calc non Af Amer: >90 mL/min (ref >90)
Glucose, Bld: 140 mg/dL — ABNORMAL HIGH (ref 70–99)
Potassium: 3.7 mmol/L (ref 3.5–5.1)
SODIUM: 137 mmol/L (ref 135–145)
Total Protein: 5.6 g/dL — ABNORMAL LOW (ref 6.0–8.3)

## 2014-09-02 LAB — AMMONIA: AMMONIA: 49 umol/L — AB (ref 11–32)

## 2014-09-02 LAB — CBC
HCT: 27.4 % — ABNORMAL LOW (ref 39.0–52.0)
HEMOGLOBIN: 9.2 g/dL — AB (ref 13.0–17.0)
MCH: 32.1 pg (ref 26.0–34.0)
MCHC: 33.6 g/dL (ref 30.0–36.0)
MCV: 95.5 fL (ref 78.0–100.0)
Platelets: 58 10*3/uL — ABNORMAL LOW (ref 150–400)
RBC: 2.87 MIL/uL — ABNORMAL LOW (ref 4.22–5.81)
RDW: 15.8 % — ABNORMAL HIGH (ref 11.5–15.5)
WBC: 4.5 10*3/uL (ref 4.0–10.5)

## 2014-09-02 MED ORDER — ENSURE ENLIVE PO LIQD
237.0000 mL | Freq: Two times a day (BID) | ORAL | Status: DC
  Administered 2014-09-02 – 2014-09-03 (×2): 237 mL via ORAL

## 2014-09-02 NOTE — Clinical Social Work Psychosocial (Signed)
Clinical Social Work Department BRIEF PSYCHOSOCIAL ASSESSMENT 09/02/2014  Patient:  [name], [name]     Account Number:  [id number]     Admit date:  03/04/2009  Clinical Social Worker:  Delrae Sawyers  Date/Time:  09/02/2014 04:17 PM  Referred by:  Physician  Date Referred:  09/02/2014 Referred for  SNF Placement   Other Referral:   none.   Interview type:  Patient Other interview type:   none.    PSYCHOSOCIAL DATA Living Status:  ALONE Admitted from facility:   Level of care:   Primary support name:  none. Primary support relationship to patient:   Degree of support available:   Patient reports patient and patient's girlfriend broke up and patient has no support system.    CURRENT CONCERNS Current Concerns  Post-Acute Placement   Other Concerns:   none.    SOCIAL WORK ASSESSMENT / PLAN CSW received referral for possible SNF placement at time of discharge. CSW met with patient at bedside regarding discharge diposition. Patient informed CSW patient lives home alone after a recent break-up with patient's girlfriend of 72 years. Patient expressed concern regarding multiple pending appointments (eye appointment and psychiatry appointment). Patient could not confim appointment dates with CSW. Patient understanding of PT recommendation, but would prefer to discharge home. CSW expressed medical team concern for patient's safety at home. Patient states patient is "constantly surrounded" by people and two RN's and a PT from [facility].    CSW informed patient of bed availability at [facility], however, patient stated patient would prefer to discharge home. CSW updated RNCM and MD regarding information above.    RNCM currently checking with [facility] if patient can return under their service.   Assessment/plan status:  Psychosocial Support/Ongoing Assessment of Needs Other assessment/ plan:   none.   Information/referral to community  resources:   SNF placement.    PATIENT'S/FAMILY'S RESPONSE TO PLAN OF CARE: Patient understanding of CSW plan of care, but is currently not agreeable. Patient expressed psychosocial concerns, CSW offered support to patient.       [name], [place] Cell: [phone]       Fax: [phone] Clinical Social Work: Orthopedics [phone]) and Surgical [phone])

## 2014-09-02 NOTE — Clinical Social Work Placement (Addendum)
Clinical Social Work Department CLINICAL SOCIAL WORK PLACEMENT NOTE 09/02/2014  Patient:  [name], [name]  Account Number:  [id number] Admit date:  03/04/2009  Clinical Social Worker:  Delrae Sawyers  Date/time:  09/02/2014 04:31 PM  Clinical Social Work is seeking post-discharge placement for this patient at the following level of care:   [place]   (*CSW will update this form in Epic as items are completed)   09/02/2014  Patient/family provided with [facility] Department of Clinical Social Work's list of facilities offering this level of care within the geographic area requested by the patient (or if unable, by the patient's family).  09/02/2014  Patient/family informed of their freedom to choose among providers that offer the needed level of care, that participate in Medicare, Medicaid or managed care program needed by the patient, have an available bed and are willing to accept the patient.  09/02/2014  Patient/family informed of [facility]' ownership interest in [facility], as well as of the fact that they are under no obligation to receive care at this facility.  PASARR submitted to EDS on 09/02/2014 PASARR number received on 09/02/2014  FL2 transmitted to all facilities in geographic area requested by pt/family on  09/02/2014 FL2 transmitted to all facilities within larger geographic area on   Patient informed that his/her managed care company has contracts with or will negotiate with  certain facilities, including the following:     Patient/family informed of bed offers received:  09/02/2014 Patient chooses bed at [facility] and [place] recommends and patient chooses bed at    Patient to be transferred to  [facility] on  09/03/2014 Patient to be transferred to facility by [facility] Patient and family notified of transfer on 09/03/2014 Name of family member notified:  Serita Butcher (patient's ex girlfriend)  The following  physician request were entered in Epic:   Additional Comments:  [facility] [place] Cell: [phone]       Fax: [phone] Clinical Social Work: Orthopedics [phone]) and Surgical [phone])
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
## 2014-09-02 NOTE — Progress Notes (Signed)
NUTRITION FOLLOW UP  Intervention:   Ensure Enlive po BID, each supplement provides 350 kcal and 20 grams of protein  Nutrition Dx:   Inadequate oral intake related to increased needs due to alcoholic cirrhosis as evidenced by PO: 50%; ongoing  Goal:   Pt will meet >90% of estimated nutritional needs; progressing  Monitor:   PO/supplement intake, labs, weight changes, I/O's  Assessment:   Patient admitted on 4/8 after being found unresponsive at home on the floor by home health nurse. Ammonia 210. Required intubation in the ED. History of smoking cigarettes, cocaine abuse, and ETOH abuse.  Pt extubated on 08/30/14. Nutritional needs re-estimated due to change in status.  Pt transferred to surgical unit from ICU on 09/01/14. Pt is now on a regular diet. Noted fair appetite (PO: 50% at breakfast this AM). Due to hx of polysubstance abuse, RD will add supplement to maximize nutritional status.  CSW and RNCM following. Therapy is recommending SNF placement. Medical team feel that it is unsafe for pt to return home, due to hx of polysubstance abuse and poor social support.  Calcium: 7.9, Glucose: 140. CBGS: 129-153.   Height: Ht Readings from Last 1 Encounters:  08/29/14 5\' 10"  (1.778 m)    Weight Status:   Wt Readings from Last 1 Encounters:  09/02/14 228 lb 13.4 oz (103.8 kg)   08/29/14 211 lb (95.709 kg)       Re-estimated needs:  Kcal: 1900-2100 Protein: 100-110 grams Fluid: 1.9-2.1 L  Skin: Noted stage II sacral pressure ulcer healed per RN note on 08/17/14, ecchymosis to left arm  Diet Order: Diet regular Room service appropriate?: Yes; Fluid consistency:: Thin   Intake/Output Summary (Last 24 hours) at 09/02/14 1352 Last data filed at 09/02/14 1030  Gross per 24 hour  Intake    880 ml  Output   1400 ml  Net   -520 ml    Last BM: 09/01/14   Labs:   Recent Labs Lab 08/30/14 0253 08/31/14 0622 09/01/14 0502 09/02/14 0456  NA 145 144 139 137  K 3.8 4.2 3.9  3.7  CL 113* 114* 112 105  CO2 20 27 23 24   BUN 38* 29* 19 14  CREATININE 1.32 1.23 0.95 0.94  CALCIUM 8.1* 7.8* 7.7* 7.9*  MG 2.0  --   --   --   PHOS 2.9  --   --   --   GLUCOSE 138* 101* 123* 140*    CBG (last 3)   Recent Labs  09/01/14 1713 09/01/14 2200 09/02/14 0845  GLUCAP 93 159* 124*    Scheduled Meds: . amitriptyline  25 mg Oral QHS  . amoxicillin  500 mg Oral Q12H  . clonazePAM  0.25 mg Oral BID  . furosemide  40 mg Oral Daily  . insulin aspart  0-9 Units Subcutaneous TID WC  . lactulose  30 g Oral TID  . pantoprazole  40 mg Oral BID AC    Continuous Infusions:   [place] A. Jimmye Norman, RD, LDN, CDE Pager: [phone] After hours Pager: [phone]

## 2014-09-02 NOTE — Progress Notes (Addendum)
[facility]                                                                              Patient Demographics  [name], is a 59 y.o. male, DOB - 06/07/1955, MWU:[id number]  Admit date - 08/29/2014   Admitting Physician Rush Farmer, MD  Outpatient Primary MD for the patient is Barbette Merino, MD  LOS - 4   Interim history 59 year old male history of alcoholic cirrhosis with noncompliance was found unresponsive at home on 48 by a home health nurse. Patient was found to be incontinent with posturing. He was intubated in the ED. Ammonia level was found to be 210. Patient subsequently extubated on 08/30/2014. He also had EGD showing mild to moderate portal gastropathy with minimal nonbleeding esophageal varices. Patient was sent to the stepdown and then to the floor. Physical therapy has seen the patient and recommended nursing home placement, currently pending.     Assessment & Plan   Acute respiratory failure due to altered mental status  -Resolved -Likely secondary to hepatic encephalopathy as well as medications -CT head showed no acute findings  Alcoholic cirrhosis w/ hepatic encephalopathy - obtundation -Patient had a recent admission in March 2016 -Ammonia upon admission was 210 -Question compliance with lactulose -Ammonia currently 49 -Continue to monitor  GI Bleed/Acute blood loss anemia on chronic anemia due to cirrhosis  -Due to portal gastropathy -Gastroenterology consulted and appreciated -Patient had EGD: Mild to moderate portal gastropathy with small amount of active bleeding likely due to NGTsuction. Minimal nonbleeding esophageal varices -Patient did require octreotide however this has been continued -Continue PPI twice a day for 4 weeks -Avoid aspirin-containing products and NSAIDs -Hemoglobin appears to be stable and improving, currently 9.2  Thrombocytopenia -Secondary to liver cirrhosis  -Appears to be slightly improving, currently  58  AKI -Resolved  Substance abuse -UDS + for benzos and cocaine at admit  -Rx benzos had been stopped at time of d/c 08/18/14 - will NOT prescribe narcotics or benzos at time of d/c, and suggest his PCP also NOT prescribe these meds in f/u   COPD -Well compensated at present  -Maintaining oxygenation on room air  Enterococcus UTI -Urine culture shows greater than 100 K enterococcus species, sensitive to ampicillin -Continue amoxicillin  Diabetes mellitus, type II -Hemoglobin A1c 5.9 (08/14/2014) -Continue insulin sliding scale CBG monitoring  Chronic Diastolic CHF -Echocardiogram 08/15/2014: EF 60-65%. Grade 1 diastolic dysfunction  -Patient does have lower extremity swelling which she states is chronic -Continue to monitor intake and output, daily weights -Continue diuresis  Hx bipolar d/o  Code Status: Full  Family Communication: None at bedside.  Per patient, his girfriend has another boyfriend.  Disposition Plan: Admitted. Pending SNF.  Not sure patient can safely go home alone.  Time Spent in minutes   35 minutes  Procedures/Significant Events  4/8 intubated 4/9 extubated 4/9 EGD - Mild to moderate portal gastropathy with small amount of active bleeding likely due to NGT suction - Minimal nonbleeding esophageal varices  Consults   PCCM Gastroenterology  DVT Prophylaxis  SCDs  Lab Results  Component Value Date   PLT 58* 09/02/2014    Medications  Scheduled Meds: . amitriptyline  25 mg Oral QHS  . amoxicillin  500 mg Oral Q12H  . clonazePAM  0.25 mg Oral BID  . furosemide  40 mg Oral Daily  . insulin aspart  0-9 Units Subcutaneous TID WC  . lactulose  30 g Oral TID  . pantoprazole  40 mg Oral BID AC   Continuous Infusions:  PRN Meds:.oxyCODONE, phenol  Antibiotics    Anti-infectives    Start     Dose/Rate Route Frequency Ordered Stop   09/01/14 2200  amoxicillin (AMOXIL) 250 MG/5ML suspension 500 mg     500 mg Oral Every 12 hours 09/01/14 1009      08/31/14 1500  amoxicillin (AMOXIL) 250 MG/5ML suspension 250 mg  Status:  Discontinued     250 mg Per Tube Every 12 hours 08/31/14 1408 08/31/14 1420   08/31/14 1500  amoxicillin (AMOXIL) 250 MG/5ML suspension 500 mg  Status:  Discontinued     500 mg Per Tube Every 12 hours 08/31/14 1420 09/01/14 1009   08/30/14 0600  cefTRIAXone (ROCEPHIN) 1 g in dextrose 5 % 50 mL IVPB - Premix  Status:  Discontinued     1 g 100 mL/hr over 30 Minutes Intravenous Every 24 hours 08/30/14 0543 08/31/14 0942   08/29/14 1315  rifaximin (XIFAXAN) tablet 550 mg  Status:  Discontinued     550 mg Per Tube 2 times daily 08/29/14 1311 08/30/14 1124      Subjective:   [name] seen and examined today.  Patient denies pain at this time.  Denies chest pain, shortness of breath, abdominal pain, headache, dizziness.  Patient inquires about his discharge and is willing to hear possible nursing home options. He is very sad regarding his current girlfriend situation.  Objective:   Filed Vitals:   09/01/14 1400 09/01/14 2158 09/02/14 0501 09/02/14 0955  BP: 101/55 141/66 128/64 122/52  Pulse: 65 70 66 81  Temp: 98.3 F (36.8 C) 98.4 F (36.9 C) 98.3 F (36.8 C) 98.1 F (36.7 C)  TempSrc: Oral Oral Oral Oral  Resp: 16 17 18 18   Height:      Weight:   103.8 kg (228 lb 13.4 oz)   SpO2: 98% 100% 100% 99%    Wt Readings from Last 3 Encounters:  09/02/14 103.8 kg (228 lb 13.4 oz)  08/17/14 95.709 kg (211 lb)  07/31/14 99.927 kg (220 lb 4.8 oz)     Intake/Output Summary (Last 24 hours) at 09/02/14 1226 Last data filed at 09/02/14 1030  Gross per 24 hour  Intake    880 ml  Output   1400 ml  Net   -520 ml    Exam  General: Well developed, well nourished, NAD, appears stated age  88: NCAT,mucous membranes moist.   Cardiovascular: S1 S2 auscultated, no rubs, murmurs or gallops. Regular rate and rhythm.  Respiratory: Clear to auscultation bilaterally  Abdomen: Soft, nontender, nondistended, +  bowel sounds  Extremities: warm dry without cyanosis clubbing. 2+ edema in LE B/L  Data Review   Micro Results Recent Results (from the past 240 hour(s))  Culture, blood (routine x 2)     Status: None (Preliminary result)   Collection Time: 08/29/14 11:26 AM  Result Value Ref Range Status   Specimen Description BLOOD LEFT ANTECUBITAL  Final   Special Requests BOTTLES DRAWN AEROBIC ONLY 2CC  Final   Culture   Final           BLOOD CULTURE RECEIVED NO GROWTH TO DATE CULTURE WILL BE HELD  FOR 5 DAYS BEFORE ISSUING A FINAL NEGATIVE REPORT Performed at [facility]    Report Status PENDING  Incomplete  Culture, blood (routine x 2)     Status: None (Preliminary result)   Collection Time: 08/29/14  1:49 PM  Result Value Ref Range Status   Specimen Description BLOOD LEFT NECK  Final   Special Requests BOTTLES DRAWN AEROBIC AND ANAEROBIC 5CC LEFT IJ  Final   Culture   Final           BLOOD CULTURE RECEIVED NO GROWTH TO DATE CULTURE WILL BE HELD FOR 5 DAYS BEFORE ISSUING A FINAL NEGATIVE REPORT Performed at [facility]    Report Status PENDING  Incomplete  Urine culture     Status: None   Collection Time: 08/29/14  4:15 PM  Result Value Ref Range Status   Specimen Description URINE, RANDOM  Final   Special Requests Normal  Final   Colony Count   Final    >=100,000 COLONIES/ML Performed at [facility]    Culture   Final    ENTEROCOCCUS SPECIES Performed at [facility]    Report Status 09/01/2014 FINAL  Final   Organism ID, Bacteria ENTEROCOCCUS SPECIES  Final      Susceptibility   Enterococcus species - MIC*    AMPICILLIN <=2 SENSITIVE Sensitive     LEVOFLOXACIN 1 SENSITIVE Sensitive     NITROFURANTOIN <=16 SENSITIVE Sensitive     VANCOMYCIN 1 SENSITIVE Sensitive     TETRACYCLINE >=16 RESISTANT Resistant     * ENTEROCOCCUS SPECIES  MRSA PCR Screening     Status: None   Collection Time: 08/29/14  4:15 PM  Result Value Ref Range Status   MRSA  by PCR NEGATIVE NEGATIVE Final    Comment:        The GeneXpert MRSA Assay (FDA approved for NASAL specimens only), is one component of a comprehensive MRSA colonization surveillance program. It is not intended to diagnose MRSA infection nor to guide or monitor treatment for MRSA infections.   Culture, respiratory (tracheal aspirate)     Status: None   Collection Time: 08/29/14  4:20 PM  Result Value Ref Range Status   Specimen Description TRACHEAL ASPIRATE  Final   Special Requests Normal  Final   Gram Stain   Final    FEW WBC PRESENT,BOTH PMN AND MONONUCLEAR RARE SQUAMOUS EPITHELIAL CELLS PRESENT ABUNDANT GRAM POSITIVE RODS ABUNDANT GRAM NEGATIVE RODS Performed at [facility]    Culture   Final    Non-Pathogenic Oropharyngeal-type Flora Isolated. Performed at [facility]    Report Status 08/31/2014 FINAL  Final    Radiology Reports Dg Shoulder Right  08/17/2014   CLINICAL DATA:  Shoulder pain following fall a few days ago, initial encounter  EXAM: RIGHT SHOULDER - 2+ VIEW  COMPARISON:  None.  FINDINGS: There is no evidence of fracture or dislocation. There is no evidence of arthropathy or other focal bone abnormality. Soft tissues are unremarkable.  IMPRESSION: No acute abnormality noted.   Electronically Signed   By: Inez Catalina M.D.   On: 08/17/2014 20:58   Ct Head Wo Contrast  08/29/2014   CLINICAL DATA:  Patient found unresponsive on the floor today.  EXAM: CT HEAD WITHOUT CONTRAST  CT CERVICAL SPINE WITHOUT CONTRAST  TECHNIQUE: Multidetector CT imaging of the head and cervical spine was performed following the standard protocol without intravenous contrast. Multiplanar CT image reconstructions of the cervical spine were also generated.  COMPARISON:  Head CT scan 08/14/2014. Head and cervical spine CT scan 01/03/2004.  FINDINGS: CT HEAD FINDINGS  Cortical atrophy and chronic microvascular ischemic change are again seen. No evidence of acute intracranial  abnormality including hemorrhage, infarct, mass lesion, mass effect, midline shift abnormal extra-axial fluid collection is identified. There is no hydrocephalus or pneumocephalus. Small left mastoid effusion is unchanged.  CT CERVICAL SPINE FINDINGS  There is no fracture or malalignment of the cervical spine. Intervertebral disc space height is maintained. Endotracheal tube and NG tube are in place. Lung apices are clear. Paraspinous soft tissue structures are unremarkable. Carotid atherosclerosis is noted.  IMPRESSION: No acute finding head or cervical spine.  Atrophy and chronic microvascular ischemic change.   Electronically Signed   By: Inge Rise M.D.   On: 08/29/2014 12:23   Ct Head Wo Contrast  08/14/2014   CLINICAL DATA:  Altered mental status  EXAM: CT HEAD WITHOUT CONTRAST  TECHNIQUE: Contiguous axial images were obtained from the base of the skull through the vertex without intravenous contrast.  COMPARISON:  07/28/2014  FINDINGS: No skull fracture is noted. There is mucosal thickening with partial opacification bilateral sphenoid sinus. Mucosal thickening bilateral ethmoid air cells. The mastoid air cells are unremarkable. Abundant secretions are noted in nasopharynx.  No intracranial hemorrhage, mass effect or midline shift.  Stable mild cerebral atrophy. Stable mild periventricular chronic white matter disease. No definite acute cortical infarction. No mass lesion is noted on this unenhanced scan.  IMPRESSION: No acute intracranial abnormality. Paranasal sinuses as described above. Stable atrophy and chronic white matter disease. No definite acute cortical infarction. Abundant secretions are noted in nasopharynx.   Electronically Signed   By: Lahoma Crocker M.D.   On: 08/14/2014 16:53   Ct Cervical Spine Wo Contrast  08/29/2014   CLINICAL DATA:  Patient found unresponsive on the floor today.  EXAM: CT HEAD WITHOUT CONTRAST  CT CERVICAL SPINE WITHOUT CONTRAST  TECHNIQUE: Multidetector CT imaging  of the head and cervical spine was performed following the standard protocol without intravenous contrast. Multiplanar CT image reconstructions of the cervical spine were also generated.  COMPARISON:  Head CT scan 08/14/2014. Head and cervical spine CT scan 01/03/2004.  FINDINGS: CT HEAD FINDINGS  Cortical atrophy and chronic microvascular ischemic change are again seen. No evidence of acute intracranial abnormality including hemorrhage, infarct, mass lesion, mass effect, midline shift abnormal extra-axial fluid collection is identified. There is no hydrocephalus or pneumocephalus. Small left mastoid effusion is unchanged.  CT CERVICAL SPINE FINDINGS  There is no fracture or malalignment of the cervical spine. Intervertebral disc space height is maintained. Endotracheal tube and NG tube are in place. Lung apices are clear. Paraspinous soft tissue structures are unremarkable. Carotid atherosclerosis is noted.  IMPRESSION: No acute finding head or cervical spine.  Atrophy and chronic microvascular ischemic change.   Electronically Signed   By: Inge Rise M.D.   On: 08/29/2014 12:23   Dg Chest Port 1 View  08/31/2014   CLINICAL DATA:  59 year old male with respiratory failure and shortness of breath.  EXAM: PORTABLE CHEST - 1 VIEW  COMPARISON:  08/29/2014 and prior radiographs  FINDINGS: There has been interval removal of an endotracheal tube, NG tube and left IJ central venous catheter.  Vascular congestion has decreased.  Mild bibasilar atelectasis again noted.  There is no evidence of pneumothorax or definite pleural effusion.  IMPRESSION: Support tube removal with decreased pulmonary vascular congestion.  Unchanged mild bibasilar atelectasis.   Electronically Signed  By: Margarette Canada M.D.   On: 08/31/2014 09:05   Dg Chest Port 1 View  08/29/2014   CLINICAL DATA:  Respiratory failure.  EXAM: PORTABLE CHEST - 1 VIEW  COMPARISON:  08/29/2014 at 11:28 a.m.  FINDINGS: Endotracheal tube with tip 4.2 cm above  the carina. Enteric tube courses into the region of the stomach and off the inferior portion of the film. Interval placement of left-sided catheter over the left neck extending inferiorly following a course more consistent with arterial location left of midline below the level of the carina possibly within the descending thoracic aorta or proximal ascending thoracic aorta.  Lungs are hypoinflated. No evidence of left pneumothorax. Mild prominence of the perihilar markings suggesting minimal vascular congestion. Cardiomediastinal silhouette and remainder of the exam is unchanged.  IMPRESSION: Hypoinflation with suggestion of mild vascular congestion.  Tubes and lines as described. Note that the left sided vascular catheter appears to take a course more consistent with arterial location possibly within the proximal ascending aorta or descending aorta. No pneumothorax.  Critical Value/emergent results were called by telephone at the time of interpretation on 08/29/2014 at 2:06 pm to Dr. Tanna Furry, who verbally acknowledged these results.   Electronically Signed   By: Marin Olp M.D.   On: 08/29/2014 14:06   Dg Chest Port 1 View  08/29/2014   CLINICAL DATA:  Unresponsive.  EXAM: PORTABLE CHEST - 1 VIEW  COMPARISON:  Radiograph 08/15/2014  FINDINGS: Endotracheal tube approximately 5.8 cm from carina. NG tube in stomach. No effusion, infiltrate, pneumothorax. Low lung volumes.  IMPRESSION: Endotracheal tube in appropriate position.  Low lung volumes.   Electronically Signed   By: Suzy Bouchard M.D.   On: 08/29/2014 11:41   Dg Chest Port 1 View  08/15/2014   CLINICAL DATA:  Respiratory failure.  EXAM: PORTABLE CHEST - 1 VIEW  COMPARISON:  08/14/2014.  FINDINGS: Endotracheal tube and NG tube in good anatomic position. Heart size normal. Pulmonary vascularity normal. Mild basilar atelectasis. No pleural effusion or pneumothorax. No acute osseous abnormality .  IMPRESSION: 1. Lines and tubes in stable position. 2.  Low lung volumes with mild basilar atelectasis.   Electronically Signed   By: Harlan   On: 08/15/2014 07:30   Dg Chest Port 1 View  08/14/2014   CLINICAL DATA:  Altered mental status.  EXAM: PORTABLE CHEST - 1 VIEW  COMPARISON:  02/10/2014  FINDINGS: Endotracheal tube with tip just below the clavicular heads. The gastric suction tube reaches the stomach, although the stomach remains moderately gas and fluid distended.  Low lung volumes with interstitial crowding. No suspected pneumonia or edema. No effusion or pneumothorax.  Normal heart size and aortic contours for technique.  IMPRESSION: 1. Endotracheal and orogastric tubes are in good position. 2. Low lung volumes.   Electronically Signed   By: Monte Fantasia M.D.   On: 08/14/2014 15:05    CBC  Recent Labs Lab 08/29/14 1126  08/30/14 0253 08/30/14 0737 08/30/14 1330 08/30/14 1830 08/31/14 0622 09/01/14 0502 09/02/14 0456  WBC 5.2  < > 7.1 7.2  --   --  4.1 3.7* 4.5  HGB 11.1*  < > 8.4* 8.4* 8.2* 8.2* 7.7* 8.5* 9.2*  HCT 33.1*  < > 24.8* 25.5* 24.8* 24.8* 23.6* 26.0* 27.4*  PLT 72*  < > 69* 59*  --   --  45* 44* 58*  MCV 95.7  < > 95.4 96.2  --   --  99.2 97.0 95.5  MCH 32.1  < > 32.3 31.7  --   --  32.4 31.7 32.1  MCHC 33.5  < > 33.9 32.9  --   --  32.6 32.7 33.6  RDW 16.9*  < > 17.2* 17.3*  --   --  16.9* 16.1* 15.8*  LYMPHSABS 0.4*  --   --   --   --   --   --   --   --   MONOABS 0.4  --   --   --   --   --   --   --   --   EOSABS 0.0  --   --   --   --   --   --   --   --   BASOSABS 0.0  --   --   --   --   --   --   --   --   < > = values in this interval not displayed.  Chemistries   Recent Labs Lab 08/29/14 1126 08/29/14 1349 08/30/14 0253 08/31/14 0622 09/01/14 0502 09/02/14 0456  NA 141 142 145 144 139 137  K 4.9 4.1 3.8 4.2 3.9 3.7  CL 109 111 113* 114* 112 105  CO2 17* 17* 20 27 23 24   GLUCOSE 203* 191* 138* 101* 123* 140*  BUN 36* 36* 38* 29* 19 14  CREATININE 1.65* 1.38* 1.32 1.23 0.95 0.94    CALCIUM 8.5 8.3* 8.1* 7.8* 7.7* 7.9*  MG  --   --  2.0  --   --   --   AST 61*  --   --  55*  --  61*  ALT 39  --   --  31  --  31  ALKPHOS 106  --   --  76  --  92  BILITOT 3.5*  --   --  2.8*  --  2.3*   ------------------------------------------------------------------------------------------------------------------ estimated creatinine clearance is 103.3 mL/min (by C-G formula based on Cr of 0.94). ------------------------------------------------------------------------------------------------------------------ No results for input(s): HGBA1C in the last 72 hours. ------------------------------------------------------------------------------------------------------------------ No results for input(s): CHOL, HDL, LDLCALC, TRIG, CHOLHDL, LDLDIRECT in the last 72 hours. ------------------------------------------------------------------------------------------------------------------ No results for input(s): TSH, T4TOTAL, T3FREE, THYROIDAB in the last 72 hours.  Invalid input(s): FREET3 ------------------------------------------------------------------------------------------------------------------ No results for input(s): VITAMINB12, FOLATE, FERRITIN, TIBC, IRON, RETICCTPCT in the last 72 hours.  Coagulation profile  Recent Labs Lab 08/29/14 1126  INR 1.48    No results for input(s): DDIMER in the last 72 hours.  Cardiac Enzymes  Recent Labs Lab 08/29/14 1126  TROPONINI <0.03   ------------------------------------------------------------------------------------------------------------------ Invalid input(s): POCBNP    Bianco Cange D.O. on 09/02/2014 at 12:26 PM  Between 7am to 7pm - Pager - [phone]  After 7pm go to [email or web address] - password [email or web address]  And look for the night coverage person covering for me after hours  [facility] Office  [phone]

## 2014-09-03 LAB — CBC
HEMATOCRIT: 25.6 % — AB (ref 39.0–52.0)
Hemoglobin: 8.6 g/dL — ABNORMAL LOW (ref 13.0–17.0)
MCH: 32.2 pg (ref 26.0–34.0)
MCHC: 33.6 g/dL (ref 30.0–36.0)
MCV: 95.9 fL (ref 78.0–100.0)
PLATELETS: 51 10*3/uL — AB (ref 150–400)
RBC: 2.67 MIL/uL — ABNORMAL LOW (ref 4.22–5.81)
RDW: 15.8 % — ABNORMAL HIGH (ref 11.5–15.5)
WBC: 3.5 10*3/uL — ABNORMAL LOW (ref 4.0–10.5)

## 2014-09-03 LAB — BASIC METABOLIC PANEL
Anion gap: 4 — ABNORMAL LOW (ref 5–15)
BUN: 11 mg/dL (ref 6–23)
CALCIUM: 7.6 mg/dL — AB (ref 8.4–10.5)
CO2: 26 mmol/L (ref 19–32)
CREATININE: 0.86 mg/dL (ref 0.50–1.35)
Chloride: 107 mmol/L (ref 96–112)
Glucose, Bld: 120 mg/dL — ABNORMAL HIGH (ref 70–99)
Potassium: 3.5 mmol/L (ref 3.5–5.1)
Sodium: 137 mmol/L (ref 135–145)

## 2014-09-03 LAB — GLUCOSE, CAPILLARY
GLUCOSE-CAPILLARY: 110 mg/dL — AB (ref 70–99)
GLUCOSE-CAPILLARY: 175 mg/dL — AB (ref 70–99)

## 2014-09-03 MED ORDER — CLONAZEPAM 0.5 MG PO TABS: 0.2500 mg | ORAL_TABLET | Freq: Two times a day (BID) | ORAL | Status: DC

## 2014-09-03 MED ORDER — MAGNESIUM OXIDE 400 (241.3 MG) MG PO TABS: 400.0000 mg | ORAL_TABLET | Freq: Two times a day (BID) | ORAL | Status: AC

## 2014-09-03 MED ORDER — LACTULOSE 10 GM/15ML PO SOLN: 30.0000 g | Freq: Three times a day (TID) | ORAL | Status: DC

## 2014-09-03 MED ORDER — AMOXICILLIN 250 MG/5ML PO SUSR: 500.0000 mg | Freq: Two times a day (BID) | ORAL | Status: AC

## 2014-09-03 MED ORDER — MAGNESIUM OXIDE 400 (241.3 MG) MG PO TABS
400.0000 mg | ORAL_TABLET | Freq: Two times a day (BID) | ORAL | Status: DC
  Administered 2014-09-03: 400 mg via ORAL
  Filled 2014-09-03 (×2): qty 1

## 2014-09-03 MED ORDER — ENSURE ENLIVE PO LIQD: 237.0000 mL | Freq: Two times a day (BID) | ORAL | Status: DC

## 2014-09-03 MED ORDER — OXYCODONE HCL 5 MG PO TABS: 5.0000 mg | ORAL_TABLET | Freq: Three times a day (TID) | ORAL | Status: DC | PRN

## 2014-09-03 NOTE — Discharge Summary (Signed)
Physician Discharge Summary  [name] MRN: [id number] DOB/AGE: Sep 22, 1955 59 y.o.  PCP: Barbette Merino, MD   Admit date: 08/29/2014 Discharge date: 09/03/2014  Discharge Diagnoses:     Active Problems:   Acute encephalopathy   UTI (lower urinary tract infection)   Acute blood loss anemia   Alcoholic cirrhosis of liver without ascites   Acute combined systolic and diastolic congestive heart failure   AKI (acute kidney injury)   Follow-up recommendations Follow-up with PCP in 5-7 days, follow-up CBC, CMP, magnesium weekly    Medication List    STOP taking these medications        diazepam 2 MG tablet  Commonly known as:  VALIUM     diazepam 5 MG tablet  Commonly known as:  VALIUM     gabapentin 600 MG tablet  Commonly known as:  NEURONTIN     ibuprofen 800 MG tablet  Commonly known as:  ADVIL,MOTRIN     lisinopril 20 MG tablet  Commonly known as:  PRINIVIL,ZESTRIL      TAKE these medications        amitriptyline 25 MG tablet  Commonly known as:  ELAVIL  Take 25 mg by mouth at bedtime.     amoxicillin 250 MG/5ML suspension  Commonly known as:  AMOXIL  Take 10 mLs (500 mg total) by mouth every 12 (twelve) hours.     clonazePAM 0.5 MG tablet  Commonly known as:  KLONOPIN  Take 0.5 tablets (0.25 mg total) by mouth 2 (two) times daily.     diclofenac sodium 1 % Gel  Commonly known as:  VOLTAREN  Apply 4 g topically as needed (for back of leg pain).     feeding supplement (ENSURE ENLIVE) Liqd  Take 237 mLs by mouth 2 (two) times daily between meals.     furosemide 40 MG tablet  Commonly known as:  LASIX  Take 40 mg by mouth daily.     insulin aspart 100 UNIT/ML FlexPen  Commonly known as:  NOVOLOG FLEXPEN  Inject 6 Units into the skin 3 (three) times daily with meals. For diabetes     Insulin Glargine 100 UNIT/ML Solostar Pen  Commonly known as:  LANTUS SOLOSTAR  Inject 20 units into the skin at bedtime: For diabetes     lactulose 10 GM/15ML  solution  Commonly known as:  CHRONULAC  Take 45 mLs (30 g total) by mouth 3 (three) times daily.     magnesium oxide 400 (241.3 MG) MG tablet  Commonly known as:  MAG-OX  Take 1 tablet (400 mg total) by mouth 2 (two) times daily.     multivitamin with minerals Tabs tablet  Take 1 tablet by mouth daily.     oxyCODONE 5 MG immediate release tablet  Commonly known as:  Oxy IR/ROXICODONE  Take 1 tablet (5 mg total) by mouth every 8 (eight) hours as needed for moderate pain.     pantoprazole 40 MG tablet  Commonly known as:  PROTONIX  Take 40 mg by mouth 2 (two) times daily.        Discharge Condition: Stable  Disposition: SNF    Consults: None  Significant Diagnostic Studies: Dg Shoulder Right  08/17/2014   CLINICAL DATA:  Shoulder pain following fall a few days ago, initial encounter  EXAM: RIGHT SHOULDER - 2+ VIEW  COMPARISON:  None.  FINDINGS: There is no evidence of fracture or dislocation. There is no evidence of arthropathy or other focal bone abnormality. Soft  tissues are unremarkable.  IMPRESSION: No acute abnormality noted.   Electronically Signed   By: Inez Catalina M.D.   On: 08/17/2014 20:58   Ct Head Wo Contrast  08/29/2014   CLINICAL DATA:  Patient found unresponsive on the floor today.  EXAM: CT HEAD WITHOUT CONTRAST  CT CERVICAL SPINE WITHOUT CONTRAST  TECHNIQUE: Multidetector CT imaging of the head and cervical spine was performed following the standard protocol without intravenous contrast. Multiplanar CT image reconstructions of the cervical spine were also generated.  COMPARISON:  Head CT scan 08/14/2014. Head and cervical spine CT scan 01/03/2004.  FINDINGS: CT HEAD FINDINGS  Cortical atrophy and chronic microvascular ischemic change are again seen. No evidence of acute intracranial abnormality including hemorrhage, infarct, mass lesion, mass effect, midline shift abnormal extra-axial fluid collection is identified. There is no hydrocephalus or pneumocephalus. Small  left mastoid effusion is unchanged.  CT CERVICAL SPINE FINDINGS  There is no fracture or malalignment of the cervical spine. Intervertebral disc space height is maintained. Endotracheal tube and NG tube are in place. Lung apices are clear. Paraspinous soft tissue structures are unremarkable. Carotid atherosclerosis is noted.  IMPRESSION: No acute finding head or cervical spine.  Atrophy and chronic microvascular ischemic change.   Electronically Signed   By: Inge Rise M.D.   On: 08/29/2014 12:23   Ct Head Wo Contrast  08/14/2014   CLINICAL DATA:  Altered mental status  EXAM: CT HEAD WITHOUT CONTRAST  TECHNIQUE: Contiguous axial images were obtained from the base of the skull through the vertex without intravenous contrast.  COMPARISON:  07/28/2014  FINDINGS: No skull fracture is noted. There is mucosal thickening with partial opacification bilateral sphenoid sinus. Mucosal thickening bilateral ethmoid air cells. The mastoid air cells are unremarkable. Abundant secretions are noted in nasopharynx.  No intracranial hemorrhage, mass effect or midline shift.  Stable mild cerebral atrophy. Stable mild periventricular chronic white matter disease. No definite acute cortical infarction. No mass lesion is noted on this unenhanced scan.  IMPRESSION: No acute intracranial abnormality. Paranasal sinuses as described above. Stable atrophy and chronic white matter disease. No definite acute cortical infarction. Abundant secretions are noted in nasopharynx.   Electronically Signed   By: Lahoma Crocker M.D.   On: 08/14/2014 16:53   Ct Cervical Spine Wo Contrast  08/29/2014   CLINICAL DATA:  Patient found unresponsive on the floor today.  EXAM: CT HEAD WITHOUT CONTRAST  CT CERVICAL SPINE WITHOUT CONTRAST  TECHNIQUE: Multidetector CT imaging of the head and cervical spine was performed following the standard protocol without intravenous contrast. Multiplanar CT image reconstructions of the cervical spine were also generated.   COMPARISON:  Head CT scan 08/14/2014. Head and cervical spine CT scan 01/03/2004.  FINDINGS: CT HEAD FINDINGS  Cortical atrophy and chronic microvascular ischemic change are again seen. No evidence of acute intracranial abnormality including hemorrhage, infarct, mass lesion, mass effect, midline shift abnormal extra-axial fluid collection is identified. There is no hydrocephalus or pneumocephalus. Small left mastoid effusion is unchanged.  CT CERVICAL SPINE FINDINGS  There is no fracture or malalignment of the cervical spine. Intervertebral disc space height is maintained. Endotracheal tube and NG tube are in place. Lung apices are clear. Paraspinous soft tissue structures are unremarkable. Carotid atherosclerosis is noted.  IMPRESSION: No acute finding head or cervical spine.  Atrophy and chronic microvascular ischemic change.   Electronically Signed   By: Inge Rise M.D.   On: 08/29/2014 12:23   Dg Chest [facility] 1 [address]  08/31/2014   CLINICAL DATA:  59 year old male with respiratory failure and shortness of breath.  EXAM: PORTABLE CHEST - 1 VIEW  COMPARISON:  08/29/2014 and prior radiographs  FINDINGS: There has been interval removal of an endotracheal tube, NG tube and left IJ central venous catheter.  Vascular congestion has decreased.  Mild bibasilar atelectasis again noted.  There is no evidence of pneumothorax or definite pleural effusion.  IMPRESSION: Support tube removal with decreased pulmonary vascular congestion.  Unchanged mild bibasilar atelectasis.   Electronically Signed   By: Margarette Canada M.D.   On: 08/31/2014 09:05   Dg Chest Port 1 View  08/29/2014   CLINICAL DATA:  Respiratory failure.  EXAM: PORTABLE CHEST - 1 VIEW  COMPARISON:  08/29/2014 at 11:28 a.m.  FINDINGS: Endotracheal tube with tip 4.2 cm above the carina. Enteric tube courses into the region of the stomach and off the inferior portion of the film. Interval placement of left-sided catheter over the left neck extending inferiorly  following a course more consistent with arterial location left of midline below the level of the carina possibly within the descending thoracic aorta or proximal ascending thoracic aorta.  Lungs are hypoinflated. No evidence of left pneumothorax. Mild prominence of the perihilar markings suggesting minimal vascular congestion. Cardiomediastinal silhouette and remainder of the exam is unchanged.  IMPRESSION: Hypoinflation with suggestion of mild vascular congestion.  Tubes and lines as described. Note that the left sided vascular catheter appears to take a course more consistent with arterial location possibly within the proximal ascending aorta or descending aorta. No pneumothorax.  Critical Value/emergent results were called by telephone at the time of interpretation on 08/29/2014 at 2:06 pm to Dr. Tanna Furry, who verbally acknowledged these results.   Electronically Signed   By: Marin Olp M.D.   On: 08/29/2014 14:06   Dg Chest Port 1 View  08/29/2014   CLINICAL DATA:  Unresponsive.  EXAM: PORTABLE CHEST - 1 VIEW  COMPARISON:  Radiograph 08/15/2014  FINDINGS: Endotracheal tube approximately 5.8 cm from carina. NG tube in stomach. No effusion, infiltrate, pneumothorax. Low lung volumes.  IMPRESSION: Endotracheal tube in appropriate position.  Low lung volumes.   Electronically Signed   By: Suzy Bouchard M.D.   On: 08/29/2014 11:41   Dg Chest Port 1 View  08/15/2014   CLINICAL DATA:  Respiratory failure.  EXAM: PORTABLE CHEST - 1 VIEW  COMPARISON:  08/14/2014.  FINDINGS: Endotracheal tube and NG tube in good anatomic position. Heart size normal. Pulmonary vascularity normal. Mild basilar atelectasis. No pleural effusion or pneumothorax. No acute osseous abnormality .  IMPRESSION: 1. Lines and tubes in stable position. 2. Low lung volumes with mild basilar atelectasis.   Electronically Signed   By: [place]   On: 08/15/2014 07:30   Dg Chest Port 1 View  08/14/2014   CLINICAL DATA:  Altered mental  status.  EXAM: PORTABLE CHEST - 1 VIEW  COMPARISON:  02/10/2014  FINDINGS: Endotracheal tube with tip just below the clavicular heads. The gastric suction tube reaches the stomach, although the stomach remains moderately gas and fluid distended.  Low lung volumes with interstitial crowding. No suspected pneumonia or edema. No effusion or pneumothorax.  Normal heart size and aortic contours for technique.  IMPRESSION: 1. Endotracheal and orogastric tubes are in good position. 2. Low lung volumes.   Electronically Signed   By: Monte Fantasia M.D.   On: 08/14/2014 15:05      Microbiology: Recent Results (from the past 240  hour(s))  Culture, blood (routine x 2)     Status: None (Preliminary result)   Collection Time: 08/29/14 11:26 AM  Result Value Ref Range Status   Specimen Description BLOOD LEFT ANTECUBITAL  Final   Special Requests BOTTLES DRAWN AEROBIC ONLY 2CC  Final   Culture   Final           BLOOD CULTURE RECEIVED NO GROWTH TO DATE CULTURE WILL BE HELD FOR 5 DAYS BEFORE ISSUING A FINAL NEGATIVE REPORT Performed at [facility]    Report Status PENDING  Incomplete  Culture, blood (routine x 2)     Status: None (Preliminary result)   Collection Time: 08/29/14  1:49 PM  Result Value Ref Range Status   Specimen Description BLOOD LEFT NECK  Final   Special Requests BOTTLES DRAWN AEROBIC AND ANAEROBIC 5CC LEFT IJ  Final   Culture   Final           BLOOD CULTURE RECEIVED NO GROWTH TO DATE CULTURE WILL BE HELD FOR 5 DAYS BEFORE ISSUING A FINAL NEGATIVE REPORT Performed at [facility]    Report Status PENDING  Incomplete  Urine culture     Status: None   Collection Time: 08/29/14  4:15 PM  Result Value Ref Range Status   Specimen Description URINE, RANDOM  Final   Special Requests Normal  Final   Colony Count   Final    >=100,000 COLONIES/ML Performed at [facility]    Culture   Final    ENTEROCOCCUS SPECIES Performed at [facility]    Report  Status 09/01/2014 FINAL  Final   Organism ID, Bacteria ENTEROCOCCUS SPECIES  Final      Susceptibility   Enterococcus species - MIC*    AMPICILLIN <=2 SENSITIVE Sensitive     LEVOFLOXACIN 1 SENSITIVE Sensitive     NITROFURANTOIN <=16 SENSITIVE Sensitive     VANCOMYCIN 1 SENSITIVE Sensitive     TETRACYCLINE >=16 RESISTANT Resistant     * ENTEROCOCCUS SPECIES  MRSA PCR Screening     Status: None   Collection Time: 08/29/14  4:15 PM  Result Value Ref Range Status   MRSA by PCR NEGATIVE NEGATIVE Final    Comment:        The GeneXpert MRSA Assay (FDA approved for NASAL specimens only), is one component of a comprehensive MRSA colonization surveillance program. It is not intended to diagnose MRSA infection nor to guide or monitor treatment for MRSA infections.   Culture, respiratory (tracheal aspirate)     Status: None   Collection Time: 08/29/14  4:20 PM  Result Value Ref Range Status   Specimen Description TRACHEAL ASPIRATE  Final   Special Requests Normal  Final   Gram Stain   Final    FEW WBC PRESENT,BOTH PMN AND MONONUCLEAR RARE SQUAMOUS EPITHELIAL CELLS PRESENT ABUNDANT GRAM POSITIVE RODS ABUNDANT GRAM NEGATIVE RODS Performed at [facility]    Culture   Final    Non-Pathogenic Oropharyngeal-type Flora Isolated. Performed at [facility]    Report Status 08/31/2014 FINAL  Final     Labs: Results for orders placed or performed during the hospital encounter of 08/29/14 (from the past 48 hour(s))  Glucose, capillary     Status: Abnormal   Collection Time: 09/01/14 11:31 AM  Result Value Ref Range   Glucose-Capillary 145 (H) 70 - 99 mg/dL  Glucose, capillary     Status: None   Collection Time: 09/01/14  5:13 PM  Result  Value Ref Range   Glucose-Capillary 93 70 - 99 mg/dL  Glucose, capillary     Status: Abnormal   Collection Time: 09/01/14 10:00 PM  Result Value Ref Range   Glucose-Capillary 159 (H) 70 - 99 mg/dL   Comment 1 Notify RN   Ammonia      Status: Abnormal   Collection Time: 09/02/14  4:56 AM  Result Value Ref Range   Ammonia 49 (H) 11 - 32 umol/L  Comprehensive metabolic panel     Status: Abnormal   Collection Time: 09/02/14  4:56 AM  Result Value Ref Range   Sodium 137 135 - 145 mmol/L   Potassium 3.7 3.5 - 5.1 mmol/L   Chloride 105 96 - 112 mmol/L   CO2 24 19 - 32 mmol/L   Glucose, Bld 140 (H) 70 - 99 mg/dL   BUN 14 6 - 23 mg/dL   Creatinine, Ser 0.94 0.50 - 1.35 mg/dL   Calcium 7.9 (L) 8.4 - 10.5 mg/dL   Total Protein 5.6 (L) 6.0 - 8.3 g/dL   Albumin 2.4 (L) 3.5 - 5.2 g/dL   AST 61 (H) 0 - 37 U/L   ALT 31 0 - 53 U/L   Alkaline Phosphatase 92 39 - 117 U/L   Total Bilirubin 2.3 (H) 0.3 - 1.2 mg/dL   GFR calc non Af Amer >90 >90 mL/min   GFR calc Af Amer >90 >90 mL/min    Comment: (NOTE) The eGFR has been calculated using the CKD EPI equation. This calculation has not been validated in all clinical situations. eGFR's persistently <90 mL/min signify possible Chronic Kidney Disease.    Anion gap 8 5 - 15  CBC     Status: Abnormal   Collection Time: 09/02/14  4:56 AM  Result Value Ref Range   WBC 4.5 4.0 - 10.5 K/uL   RBC 2.87 (L) 4.22 - 5.81 MIL/uL   Hemoglobin 9.2 (L) 13.0 - 17.0 g/dL   HCT 27.4 (L) 39.0 - 52.0 %   MCV 95.5 78.0 - 100.0 fL   MCH 32.1 26.0 - 34.0 pg   MCHC 33.6 30.0 - 36.0 g/dL   RDW 15.8 (H) 11.5 - 15.5 %   Platelets 58 (L) 150 - 400 K/uL    Comment: DELTA CHECK NOTED REPEATED TO VERIFY PLATELET COUNT CONFIRMED BY SMEAR   Glucose, capillary     Status: Abnormal   Collection Time: 09/02/14  8:45 AM  Result Value Ref Range   Glucose-Capillary 124 (H) 70 - 99 mg/dL  Glucose, capillary     Status: Abnormal   Collection Time: 09/02/14  1:10 PM  Result Value Ref Range   Glucose-Capillary 157 (H) 70 - 99 mg/dL  Glucose, capillary     Status: Abnormal   Collection Time: 09/02/14  4:56 PM  Result Value Ref Range   Glucose-Capillary 151 (H) 70 - 99 mg/dL  Glucose, capillary     Status:  Abnormal   Collection Time: 09/02/14  9:57 PM  Result Value Ref Range   Glucose-Capillary 142 (H) 70 - 99 mg/dL   Comment 1 Notify RN    Comment 2 Document in Chart   CBC     Status: Abnormal   Collection Time: 09/03/14  4:00 AM  Result Value Ref Range   WBC 3.5 (L) 4.0 - 10.5 K/uL   RBC 2.67 (L) 4.22 - 5.81 MIL/uL   Hemoglobin 8.6 (L) 13.0 - 17.0 g/dL   HCT 25.6 (L) 39.0 - 52.0 %  MCV 95.9 78.0 - 100.0 fL   MCH 32.2 26.0 - 34.0 pg   MCHC 33.6 30.0 - 36.0 g/dL   RDW 15.8 (H) 11.5 - 15.5 %   Platelets 51 (L) 150 - 400 K/uL    Comment: CONSISTENT WITH PREVIOUS RESULT  Basic metabolic panel     Status: Abnormal   Collection Time: 09/03/14  4:00 AM  Result Value Ref Range   Sodium 137 135 - 145 mmol/L   Potassium 3.5 3.5 - 5.1 mmol/L   Chloride 107 96 - 112 mmol/L   CO2 26 19 - 32 mmol/L   Glucose, Bld 120 (H) 70 - 99 mg/dL   BUN 11 6 - 23 mg/dL   Creatinine, Ser 0.86 0.50 - 1.35 mg/dL   Calcium 7.6 (L) 8.4 - 10.5 mg/dL   GFR calc non Af Amer >90 >90 mL/min   GFR calc Af Amer >90 >90 mL/min    Comment: (NOTE) The eGFR has been calculated using the CKD EPI equation. This calculation has not been validated in all clinical situations. eGFR's persistently <90 mL/min signify possible Chronic Kidney Disease.    Anion gap 4 (L) 5 - 15  Glucose, capillary     Status: Abnormal   Collection Time: 09/03/14  7:36 AM  Result Value Ref Range   Glucose-Capillary 110 (H) 70 - 99 mg/dL     Interim history 59 year old male history of alcoholic cirrhosis with noncompliance was found unresponsive at home on 48 by a home health nurse. Patient was found to be incontinent with posturing. He was intubated in the ED. Ammonia level was found to be 210. Patient subsequently extubated on 08/30/2014. He also had EGD showing mild to moderate portal gastropathy with minimal nonbleeding esophageal varices. Patient was sent to the stepdown and then to the floor. Physical therapy has seen the patient and  recommended nursing home placement, currently pending.    Assessment & Plan   Acute respiratory failure due to altered mental status  -Resolved, patient was intubated 4/8, extubated 4/9, -Likely secondary to hepatic encephalopathy as well as medications -CT head showed no acute findings Completely resolved  Alcoholic cirrhosis w/ hepatic encephalopathy - obtundation -Patient had a recent admission in March 2016 -Ammonia upon admission was 210 -Question compliance with lactulose -Ammonia currently 49 -Continue to lactulose  GI Bleed/Acute blood loss anemia on chronic anemia due to cirrhosis  -Due to portal gastropathy -Gastroenterology consulted and appreciated 4/9 EGD - Mild to moderate portal gastropathy with small amount of active bleeding likely due to NGT suction - Minimal nonbleeding esophageal varices -Patient did require octreotide however this has been continued -Continue PPI twice a day for 4 weeks, then at least once a day -Avoid aspirin-containing products and NSAIDs -Hemoglobin appears to be stable and improving, currently 9.2  Thrombocytopenia -Secondary to liver cirrhosis  -Appears to be slightly improving, currently 58 Check CBC weekly  AKI -Resolved  Substance abuse -UDS + for benzos and cocaine at admit  -Rx benzos had been stopped at time of d/c 08/18/14 - will NOT prescribe narcotics or benzos at time of d/c, and suggest his PCP also NOT prescribe these meds in f/u   COPD -Well compensated at present  -Maintaining oxygenation on room air  Enterococcus UTI -Urine culture shows greater than 100 K enterococcus species, sensitive to ampicillin -Continue amoxicillin for another 7 days  Diabetes mellitus, type II -Hemoglobin A1c 5.9 (08/14/2014) -Resume home regimen  Chronic Diastolic CHF -Echocardiogram 08/15/2014: EF 60-65%. Grade 1 diastolic  dysfunction  -Patient does have lower extremity swelling which she states is chronic -Continue to  monitor intake and output, daily weights Continue Lasix  Hx bipolar d/o  Code Status: Full          Discharge Exam: Blood pressure 132/66, pulse 78, temperature 98.6 F (37 C), temperature source Oral, resp. rate 18, height 5' 10"  (1.778 m), weight 102.4 kg (225 lb 12 oz), SpO2 99 %.  General: Well developed, well nourished, NAD, appears stated age  75: NCAT,mucous membranes moist.   Cardiovascular: S1 S2 auscultated, no rubs, murmurs or gallops. Regular rate and rhythm.  Respiratory: Clear to auscultation bilaterally  Abdomen: Soft, nontender, nondistended, + bowel sounds  Extremities: warm dry without cyanosis clubbing. 2+ edema in LE B/L        Discharge Instructions    Diet - low sodium heart healthy    Complete by:  As directed      Increase activity slowly    Complete by:  As directed              Signed: Afiya Ferrebee 09/03/2014, 9:32 AM         \

## 2014-09-03 NOTE — Progress Notes (Signed)
Called report to [employer]. Discharged by [facility] in good condition.

## 2014-09-03 NOTE — Discharge Planning (Signed)
Patient to be discharged to [facility]. Patient and patient's ex-girlfriend, Serita Butcher, notified.  CSW Surveyor, quantity approved 7 day LOG (Letter of Guarantee).  Facility: [facility] RN to call report: [phone] Transport: EMS ([place])  [name], [place] Cell: [phone]       Fax: [phone] Clinical Social Work: Orthopedics [phone]) and Surgical [phone])

## 2014-09-04 LAB — CULTURE, BLOOD (ROUTINE X 2)
CULTURE: NO GROWTH
CULTURE: NO GROWTH

## 2014-09-26 ENCOUNTER — Encounter (HOSPITAL_COMMUNITY): Payer: Self-pay | Admitting: Nurse Practitioner

## 2014-09-26 ENCOUNTER — Inpatient Hospital Stay (HOSPITAL_COMMUNITY)
Admission: EM | Admit: 2014-09-26 | Discharge: 2014-10-07 | DRG: 432 | Disposition: A | Discharge status: Skilled Nursing Facility | Payer: Medicare PPO | Attending: Family Medicine | Admitting: Family Medicine

## 2014-09-26 DIAGNOSIS — N179 Acute kidney failure, unspecified: Secondary | ICD-10-CM | POA: Diagnosis present

## 2014-09-26 DIAGNOSIS — K219 Gastro-esophageal reflux disease without esophagitis: Secondary | ICD-10-CM | POA: Diagnosis present

## 2014-09-26 DIAGNOSIS — I8500 Esophageal varices without bleeding: Secondary | ICD-10-CM | POA: Diagnosis present

## 2014-09-26 DIAGNOSIS — Z6830 Body mass index (BMI) 30.0-30.9, adult: Secondary | ICD-10-CM | POA: Diagnosis not present

## 2014-09-26 DIAGNOSIS — R1900 Intra-abdominal and pelvic swelling, mass and lump, unspecified site: Secondary | ICD-10-CM | POA: Diagnosis present

## 2014-09-26 DIAGNOSIS — F1010 Alcohol abuse, uncomplicated: Secondary | ICD-10-CM | POA: Diagnosis present

## 2014-09-26 DIAGNOSIS — Z794 Long term (current) use of insulin: Secondary | ICD-10-CM | POA: Diagnosis not present

## 2014-09-26 DIAGNOSIS — Z8673 Personal history of transient ischemic attack (TIA), and cerebral infarction without residual deficits: Secondary | ICD-10-CM | POA: Diagnosis not present

## 2014-09-26 DIAGNOSIS — I8510 Secondary esophageal varices without bleeding: Secondary | ICD-10-CM | POA: Diagnosis present

## 2014-09-26 DIAGNOSIS — Z79899 Other long term (current) drug therapy: Secondary | ICD-10-CM

## 2014-09-26 DIAGNOSIS — E1122 Type 2 diabetes mellitus with diabetic chronic kidney disease: Secondary | ICD-10-CM | POA: Diagnosis present

## 2014-09-26 DIAGNOSIS — Z9119 Patient's noncompliance with other medical treatment and regimen: Secondary | ICD-10-CM | POA: Diagnosis present

## 2014-09-26 DIAGNOSIS — N508 Other specified disorders of male genital organs: Secondary | ICD-10-CM | POA: Diagnosis present

## 2014-09-26 DIAGNOSIS — G9340 Encephalopathy, unspecified: Secondary | ICD-10-CM | POA: Diagnosis present

## 2014-09-26 DIAGNOSIS — N189 Chronic kidney disease, unspecified: Secondary | ICD-10-CM | POA: Diagnosis present

## 2014-09-26 DIAGNOSIS — E119 Type 2 diabetes mellitus without complications: Secondary | ICD-10-CM

## 2014-09-26 DIAGNOSIS — D649 Anemia, unspecified: Secondary | ICD-10-CM | POA: Diagnosis present

## 2014-09-26 DIAGNOSIS — F17210 Nicotine dependence, cigarettes, uncomplicated: Secondary | ICD-10-CM | POA: Diagnosis present

## 2014-09-26 DIAGNOSIS — F1410 Cocaine abuse, uncomplicated: Secondary | ICD-10-CM | POA: Diagnosis present

## 2014-09-26 DIAGNOSIS — I129 Hypertensive chronic kidney disease with stage 1 through stage 4 chronic kidney disease, or unspecified chronic kidney disease: Secondary | ICD-10-CM | POA: Diagnosis present

## 2014-09-26 DIAGNOSIS — K7031 Alcoholic cirrhosis of liver with ascites: Secondary | ICD-10-CM | POA: Diagnosis present

## 2014-09-26 DIAGNOSIS — Z8659 Personal history of other mental and behavioral disorders: Secondary | ICD-10-CM

## 2014-09-26 DIAGNOSIS — E1140 Type 2 diabetes mellitus with diabetic neuropathy, unspecified: Secondary | ICD-10-CM | POA: Diagnosis present

## 2014-09-26 DIAGNOSIS — E669 Obesity, unspecified: Secondary | ICD-10-CM | POA: Diagnosis present

## 2014-09-26 DIAGNOSIS — R188 Other ascites: Secondary | ICD-10-CM

## 2014-09-26 DIAGNOSIS — J449 Chronic obstructive pulmonary disease, unspecified: Secondary | ICD-10-CM | POA: Diagnosis present

## 2014-09-26 DIAGNOSIS — F319 Bipolar disorder, unspecified: Secondary | ICD-10-CM | POA: Diagnosis present

## 2014-09-26 DIAGNOSIS — I5032 Chronic diastolic (congestive) heart failure: Secondary | ICD-10-CM | POA: Diagnosis present

## 2014-09-26 DIAGNOSIS — D6959 Other secondary thrombocytopenia: Secondary | ICD-10-CM | POA: Diagnosis present

## 2014-09-26 DIAGNOSIS — Z91199 Patient's noncompliance with other medical treatment and regimen due to unspecified reason: Secondary | ICD-10-CM

## 2014-09-26 LAB — URINALYSIS, ROUTINE W REFLEX MICROSCOPIC
Glucose, UA: NEGATIVE mg/dL
Hgb urine dipstick: NEGATIVE
KETONES UR: 15 mg/dL — AB
Leukocytes, UA: NEGATIVE
NITRITE: POSITIVE — AB
PH: 5 (ref 5.0–8.0)
Protein, ur: NEGATIVE mg/dL
SPECIFIC GRAVITY, URINE: 1.014 (ref 1.005–1.030)
UROBILINOGEN UA: 0.2 mg/dL (ref 0.0–1.0)

## 2014-09-26 LAB — URINE MICROSCOPIC-ADD ON

## 2014-09-26 LAB — COMPREHENSIVE METABOLIC PANEL
ALT: 25 U/L (ref 17–63)
AST: 67 U/L — AB (ref 15–41)
Albumin: 2.1 g/dL — ABNORMAL LOW (ref 3.5–5.0)
Alkaline Phosphatase: 102 U/L (ref 38–126)
Anion gap: 7 (ref 5–15)
BILIRUBIN TOTAL: 2.9 mg/dL — AB (ref 0.3–1.2)
BUN: 39 mg/dL — ABNORMAL HIGH (ref 6–20)
CO2: 23 mmol/L (ref 22–32)
Calcium: 8 mg/dL — ABNORMAL LOW (ref 8.9–10.3)
Chloride: 96 mmol/L — ABNORMAL LOW (ref 101–111)
Creatinine, Ser: 1.94 mg/dL — ABNORMAL HIGH (ref 0.61–1.24)
GFR calc Af Amer: 42 mL/min — ABNORMAL LOW (ref >60)
GFR, EST NON AFRICAN AMERICAN: 36 mL/min — AB (ref >60)
Glucose, Bld: 134 mg/dL — ABNORMAL HIGH (ref 70–99)
Potassium: 4.1 mmol/L (ref 3.5–5.1)
SODIUM: 126 mmol/L — AB (ref 135–145)
Total Protein: 5.8 g/dL — ABNORMAL LOW (ref 6.5–8.1)

## 2014-09-26 LAB — CBC
HCT: 34 % — ABNORMAL LOW (ref 39.0–52.0)
Hemoglobin: 11.9 g/dL — ABNORMAL LOW (ref 13.0–17.0)
MCH: 31 pg (ref 26.0–34.0)
MCHC: 35 g/dL (ref 30.0–36.0)
MCV: 88.5 fL (ref 78.0–100.0)
Platelets: 82 10*3/uL — ABNORMAL LOW (ref 150–400)
RBC: 3.84 MIL/uL — ABNORMAL LOW (ref 4.22–5.81)
RDW: 15.9 % — AB (ref 11.5–15.5)
WBC: 7.4 10*3/uL (ref 4.0–10.5)

## 2014-09-26 LAB — GLUCOSE, CAPILLARY: Glucose-Capillary: 107 mg/dL — ABNORMAL HIGH (ref 70–99)

## 2014-09-26 LAB — POC OCCULT BLOOD, ED: Fecal Occult Bld: NEGATIVE

## 2014-09-26 LAB — RAPID URINE DRUG SCREEN, HOSP PERFORMED
Amphetamines: NOT DETECTED
Barbiturates: NOT DETECTED
Benzodiazepines: POSITIVE — AB
COCAINE: NOT DETECTED
Opiates: POSITIVE — AB
TETRAHYDROCANNABINOL: NOT DETECTED

## 2014-09-26 LAB — PROTIME-INR
INR: 1.75 — AB (ref 0.00–1.49)
Prothrombin Time: 20.6 seconds — ABNORMAL HIGH (ref 11.6–15.2)

## 2014-09-26 LAB — AMMONIA: Ammonia: 47 umol/L — ABNORMAL HIGH (ref 9–35)

## 2014-09-26 LAB — ETHANOL: Alcohol, Ethyl (B): <5 mg/dL (ref <5)

## 2014-09-26 LAB — CBG MONITORING, ED: Glucose-Capillary: 129 mg/dL — ABNORMAL HIGH (ref 70–99)

## 2014-09-26 MED ORDER — PANTOPRAZOLE SODIUM 40 MG PO TBEC
40.0000 mg | DELAYED_RELEASE_TABLET | Freq: Two times a day (BID) | ORAL | Status: DC
  Administered 2014-09-26 – 2014-10-07 (×22): 40 mg via ORAL
  Filled 2014-09-26 (×18): qty 1

## 2014-09-26 MED ORDER — LACTULOSE 10 GM/15ML PO SOLN
20.0000 g | Freq: Once | ORAL | Status: AC
  Administered 2014-09-26: 20 g via ORAL
  Filled 2014-09-26: qty 30

## 2014-09-26 MED ORDER — FUROSEMIDE 10 MG/ML IJ SOLN
40.0000 mg | Freq: Every day | INTRAMUSCULAR | Status: DC
  Administered 2014-09-26 – 2014-09-28 (×3): 40 mg via INTRAVENOUS
  Filled 2014-09-26 (×3): qty 4

## 2014-09-26 MED ORDER — SODIUM CHLORIDE 0.9 % IJ SOLN
3.0000 mL | Freq: Two times a day (BID) | INTRAMUSCULAR | Status: DC
  Administered 2014-09-27 – 2014-10-07 (×5): 3 mL via INTRAVENOUS

## 2014-09-26 MED ORDER — SODIUM CHLORIDE 0.9 % IV SOLN
INTRAVENOUS | Status: DC
  Administered 2014-09-26: 16:00:00 via INTRAVENOUS

## 2014-09-26 MED ORDER — SODIUM CHLORIDE 0.9 % IJ SOLN
3.0000 mL | INTRAMUSCULAR | Status: DC | PRN
  Administered 2014-10-04: 3 mL via INTRAVENOUS
  Filled 2014-09-26: qty 3

## 2014-09-26 MED ORDER — INSULIN ASPART 100 UNIT/ML ~~LOC~~ SOLN
0.0000 [IU] | SUBCUTANEOUS | Status: DC
  Administered 2014-09-27 (×2): 2 [IU] via SUBCUTANEOUS
  Administered 2014-09-27: 1 [IU] via SUBCUTANEOUS
  Administered 2014-09-28 (×3): 2 [IU] via SUBCUTANEOUS
  Administered 2014-09-28 (×2): 1 [IU] via SUBCUTANEOUS
  Administered 2014-09-28 – 2014-09-29 (×2): 2 [IU] via SUBCUTANEOUS
  Administered 2014-09-29: 1 [IU] via SUBCUTANEOUS
  Administered 2014-09-29 (×3): 2 [IU] via SUBCUTANEOUS
  Administered 2014-09-30 (×2): 1 [IU] via SUBCUTANEOUS
  Administered 2014-09-30 (×3): 2 [IU] via SUBCUTANEOUS
  Administered 2014-10-01 (×3): 1 [IU] via SUBCUTANEOUS
  Administered 2014-10-01: 2 [IU] via SUBCUTANEOUS
  Administered 2014-10-02: 1 [IU] via SUBCUTANEOUS
  Administered 2014-10-02: 2 [IU] via SUBCUTANEOUS
  Administered 2014-10-02 – 2014-10-03 (×4): 1 [IU] via SUBCUTANEOUS
  Administered 2014-10-03: 2 [IU] via SUBCUTANEOUS
  Administered 2014-10-03 – 2014-10-04 (×3): 1 [IU] via SUBCUTANEOUS
  Administered 2014-10-04 (×2): 2 [IU] via SUBCUTANEOUS

## 2014-09-26 MED ORDER — LORAZEPAM 1 MG PO TABS: 1.0000 mg | ORAL_TABLET | Freq: Four times a day (QID) | ORAL | Status: DC | PRN

## 2014-09-26 MED ORDER — ADULT MULTIVITAMIN W/MINERALS CH
1.0000 | ORAL_TABLET | Freq: Every day | ORAL | Status: DC
  Administered 2014-09-27 – 2014-10-07 (×11): 1 via ORAL
  Filled 2014-09-26 (×12): qty 1

## 2014-09-26 MED ORDER — SODIUM CHLORIDE 0.9 % IV SOLN
INTRAVENOUS | Status: DC
  Administered 2014-09-26: 23:00:00 via INTRAVENOUS

## 2014-09-26 MED ORDER — LACTULOSE 10 GM/15ML PO SOLN
30.0000 g | Freq: Three times a day (TID) | ORAL | Status: DC
  Administered 2014-09-26 – 2014-09-28 (×5): 30 g via ORAL
  Filled 2014-09-26 (×7): qty 45

## 2014-09-26 MED ORDER — SODIUM CHLORIDE 0.9 % IJ SOLN
3.0000 mL | Freq: Two times a day (BID) | INTRAMUSCULAR | Status: DC
  Administered 2014-09-27 – 2014-10-07 (×19): 3 mL via INTRAVENOUS

## 2014-09-26 MED ORDER — VITAMIN B-1 100 MG PO TABS
100.0000 mg | ORAL_TABLET | Freq: Every day | ORAL | Status: DC
  Administered 2014-09-27 – 2014-10-07 (×11): 100 mg via ORAL
  Filled 2014-09-26 (×12): qty 1

## 2014-09-26 MED ORDER — CLONAZEPAM 0.5 MG PO TABS
0.2500 mg | ORAL_TABLET | Freq: Two times a day (BID) | ORAL | Status: DC
  Administered 2014-09-26: 0.25 mg via ORAL
  Filled 2014-09-26: qty 1

## 2014-09-26 MED ORDER — LORAZEPAM 2 MG/ML IJ SOLN: 1.0000 mg | Freq: Four times a day (QID) | INTRAMUSCULAR | Status: DC | PRN

## 2014-09-26 MED ORDER — FOLIC ACID 1 MG PO TABS
1.0000 mg | ORAL_TABLET | Freq: Every day | ORAL | Status: DC
  Administered 2014-09-27 – 2014-10-07 (×11): 1 mg via ORAL
  Filled 2014-09-26 (×12): qty 1

## 2014-09-26 MED ORDER — THIAMINE HCL 100 MG/ML IJ SOLN
100.0000 mg | Freq: Every day | INTRAMUSCULAR | Status: DC
  Administered 2014-09-26: 100 mg via INTRAVENOUS
  Filled 2014-09-26 (×2): qty 1

## 2014-09-26 MED ORDER — SODIUM CHLORIDE 0.9 % IV SOLN: 250.0000 mL | INTRAVENOUS | Status: DC | PRN

## 2014-09-26 NOTE — ED Provider Notes (Signed)
CSN: [id number]     Arrival date & time 09/26/14  1457 History   First MD Initiated Contact with Patient 09/26/14 1518     Chief Complaint  Patient presents with  . Abdominal Pain    Level V caveat altered mental status. I attempted to call patient's home, no answer (Consider location/radiation/quality/duration/timing/severity/associated sxs/prior Treatment) HPI Patient complains of swollen, painful testicles and swollen abdomen for one month. He reports that he presents today as testicles became more painful today. He reports difficulty urinating earlier today however presently denies urgency urinate. He denies shortness of breath. Denies other associated symptoms. Also states arms and legs are swollen. Reports last cocaine use 6 months ago. Denies IV drug use. Last alcohol was 3 years ago Past Medical History  Diagnosis Date  . Neuropathy   . Diabetes mellitus   . Bipolar affect, depressed   . Hypertension   . Arthritis   . Stroke     Mini stroke about 37yrs ago  . Cirrhosis   . Alcohol abuse   . Chronic pain   . Cocaine abuse   . Muscle spasm     both legs  . Encephalopathy, hepatic   . Detached retina   . COPD (chronic obstructive pulmonary disease)     emphysema  . Bronchitis   . Barrett's esophagus   . GERD (gastroesophageal reflux disease)     has ulcer  . Anemia    Past Surgical History  Procedure Laterality Date  . Fracture surgery      Leg and arm 27yrs ago  . Esophagogastroduodenoscopy  04/04/2012    Procedure: ESOPHAGOGASTRODUODENOSCOPY (EGD);  Surgeon: Irene Shipper, MD;  Location: [facility] ENDOSCOPY;  Service: Endoscopy;  Laterality: N/A;  . Esophagogastroduodenoscopy Left 03/13/2013    Procedure: ESOPHAGOGASTRODUODENOSCOPY (EGD);  Surgeon: Arta Silence, MD;  Location: [facility] ENDOSCOPY;  Service: Endoscopy;  Laterality: Left;  Marland Kitchen Eye surgery  8 months ago both eyes    cataracts both eyes, detached eye, gas pocket  . Vasectomy    . Pars plana vitrectomy Left 07/08/2013     Procedure: PARS PLANA VITRECTOMY WITH 25 GAUGE;  Surgeon: Hurman Horn, MD;  Location: [place];  Service: Ophthalmology;  Laterality: Left;  . Intraocular lens removal Left 07/08/2013    Procedure: REMOVAL OF INTRAOCULAR LENS;  Surgeon: Hurman Horn, MD;  Location: [place];  Service: Ophthalmology;  Laterality: Left;  . Placement and suture of secondary intraocular lens Left 07/08/2013    Procedure: PLACEMENT AND SUTURE OF SECONDARY INTRAOCULAR LENS;  Surgeon: Hurman Horn, MD;  Location: [place];  Service: Ophthalmology;  Laterality: Left;  Insertion of Anterior Capsule Intraocular Lens   . Esophagogastroduodenoscopy N/A 01/16/2014    Procedure: ESOPHAGOGASTRODUODENOSCOPY (EGD);  Surgeon: Winfield Cunas., MD;  Location: [facility] ENDOSCOPY;  Service: Endoscopy;  Laterality: N/A;  . Colonoscopy N/A 01/17/2014    Procedure: COLONOSCOPY;  Surgeon: Winfield Cunas., MD;  Location: [facility] ENDOSCOPY;  Service: Endoscopy;  Laterality: N/A;  possible banding  . Esophagogastroduodenoscopy N/A 08/30/2014    Procedure: ESOPHAGOGASTRODUODENOSCOPY (EGD);  Surgeon: Wilford Corner, MD;  Location: [facility] ENDOSCOPY;  Service: Endoscopy;  Laterality: N/A;  bedside   Family History  Problem Relation Age of Onset  . Hypotension Mother    History  Substance Use Topics  . Smoking status: Current Every Day Smoker -- 1.00 packs/day for 30 years    Types: Cigarettes  . Smokeless tobacco: Never Used  . Alcohol Use: 0.0 oz/week  Comment: 12 pk beer daily  06/2013 - no alcohol since 11/2012    Review of Systems  Unable to perform ROS Gastrointestinal: Positive for abdominal distention.  Genitourinary: Positive for scrotal swelling.   altered mental status    Allergies  Review of patient's allergies indicates no known allergies.  Home Medications   Prior to Admission medications   Medication Sig Start Date End Date Taking? Authorizing Provider  amitriptyline (ELAVIL) 25 MG tablet Take 25 mg by mouth at bedtime.     Historical Provider, MD  clonazePAM (KLONOPIN) 0.5 MG tablet Take 0.5 tablets (0.25 mg total) by mouth 2 (two) times daily. 09/03/14   Reyne Dumas, MD  diclofenac sodium (VOLTAREN) 1 % GEL Apply 4 g topically as needed (for back of leg pain).    Historical Provider, MD  feeding supplement, ENSURE ENLIVE, (ENSURE ENLIVE) LIQD Take 237 mLs by mouth 2 (two) times daily between meals. 09/03/14   Reyne Dumas, MD  furosemide (LASIX) 40 MG tablet Take 40 mg by mouth daily. 08/18/14   Historical Provider, MD  insulin aspart (NOVOLOG FLEXPEN) 100 UNIT/ML FlexPen Inject 6 Units into the skin 3 (three) times daily with meals. For diabetes 02/19/14   Encarnacion Slates, NP  Insulin Glargine (LANTUS SOLOSTAR) 100 UNIT/ML Solostar Pen Inject 20 units into the skin at bedtime: For diabetes 08/18/14   Bonnielee Haff, MD  lactulose (CHRONULAC) 10 GM/15ML solution Take 45 mLs (30 g total) by mouth 3 (three) times daily. 09/03/14   Reyne Dumas, MD  magnesium oxide (MAG-OX) 400 (241.3 MG) MG tablet Take 1 tablet (400 mg total) by mouth 2 (two) times daily. 09/03/14   Reyne Dumas, MD  Multiple Vitamin (MULTIVITAMIN WITH MINERALS) TABS tablet Take 1 tablet by mouth daily. 07/31/14   Geradine Girt, DO  oxyCODONE (OXY IR/ROXICODONE) 5 MG immediate release tablet Take 1 tablet (5 mg total) by mouth every 8 (eight) hours as needed for moderate pain. 09/03/14   Reyne Dumas, MD  pantoprazole (PROTONIX) 40 MG tablet Take 40 mg by mouth 2 (two) times daily. 07/09/14   Historical Provider, MD   BP 131/83 mmHg  Pulse 67  Temp(Src) 98.1 F (36.7 C) (Oral)  Resp 18  Ht 5\' 11"  (1.803 m)  Wt 256 lb 6.4 oz (116.302 kg)  BMI 35.78 kg/m2  SpO2 96% Physical Exam  Constitutional:  Chronically ill-appearing  HENT:  Head: Normocephalic and atraumatic.  Eyes: Conjunctivae are normal. Pupils are equal, round, and reactive to light.  Neck: Neck supple. No tracheal deviation present. No thyromegaly present.  Cardiovascular: Normal rate and  regular rhythm.   No murmur heard. Pulmonary/Chest: Effort normal and breath sounds normal.  Abdominal: Soft. Bowel sounds are normal. He exhibits distension. There is no tenderness.  Urostomy bag in place  Genitourinary: Guaiac negative stool. No penile tenderness.  Scrotal swelling, penis swollen. Good rectal normal tone brown stool Hemoccult negative  Musculoskeletal: Normal range of motion. He exhibits edema. He exhibits no tenderness.  Lateral lower extremities with 3+ pretibial pitting edema  Neurological: He is alert. Coordination normal.  Oriented to name and hospital does not know month or does not know present. Moves all extremities cranial nerves II through XII grossly intact. Positive asterixis  Skin: Skin is warm and dry. No rash noted.  Nursing note and vitals reviewed.  questionable urostomy bag in place there is a bag at right side of abdomen which appears to have urine in it.  ED Course  Procedures (including critical  care time) Labs Review Labs Reviewed - No data to display  Imaging Review No results found.   EKG Interpretation   Date/Time:  Friday Sep 26 2014 15:04:34 EDT Ventricular Rate:  70 PR Interval:  179 QRS Duration: 100 QT Interval:  425 QTC Calculation: 459 R Axis:   31 Text Interpretation:  Age not entered, assumed to be  59 years old for  purpose of ECG interpretation Sinus rhythm Low voltage, precordial leads  Baseline wander in lead(s) V3 No significant change since last tracing  Confirmed by Hatim Homann  MD, Nakiyah Beverley [phone]) on 09/26/2014 4:08:47 PM     BLadder scan shows 600 mL of urine. Foley ordered As of 6:45 PM patient has 600 mL of urine in Foley bag. He is resting comfortably states "I feel drowsy".  He is alert gcs 15 appears in no acute distrress.Lactulose ordered. Results for orders placed or performed during the hospital encounter of 09/26/14  Drug screen panel, emergency  Result Value Ref Range   Opiates POSITIVE (A) NONE DETECTED    Cocaine NONE DETECTED NONE DETECTED   Benzodiazepines POSITIVE (A) NONE DETECTED   Amphetamines NONE DETECTED NONE DETECTED   Tetrahydrocannabinol NONE DETECTED NONE DETECTED   Barbiturates NONE DETECTED NONE DETECTED  Urinalysis, Routine w reflex microscopic  Result Value Ref Range   Color, Urine AMBER (A) YELLOW   APPearance CLOUDY (A) CLEAR   Specific Gravity, Urine 1.014 1.005 - 1.030   pH 5.0 5.0 - 8.0   Glucose, UA NEGATIVE NEGATIVE mg/dL   Hgb urine dipstick NEGATIVE NEGATIVE   Bilirubin Urine SMALL (A) NEGATIVE   Ketones, ur 15 (A) NEGATIVE mg/dL   Protein, ur NEGATIVE NEGATIVE mg/dL   Urobilinogen, UA 0.2 0.0 - 1.0 mg/dL   Nitrite POSITIVE (A) NEGATIVE   Leukocytes, UA NEGATIVE NEGATIVE  Comprehensive metabolic panel  Result Value Ref Range   Sodium 126 (L) 135 - 145 mmol/L   Potassium 4.1 3.5 - 5.1 mmol/L   Chloride 96 (L) 101 - 111 mmol/L   CO2 23 22 - 32 mmol/L   Glucose, Bld 134 (H) 70 - 99 mg/dL   BUN 39 (H) 6 - 20 mg/dL   Creatinine, Ser 1.94 (H) 0.61 - 1.24 mg/dL   Calcium 8.0 (L) 8.9 - 10.3 mg/dL   Total Protein 5.8 (L) 6.5 - 8.1 g/dL   Albumin 2.1 (L) 3.5 - 5.0 g/dL   AST 67 (H) 15 - 41 U/L   ALT 25 17 - 63 U/L   Alkaline Phosphatase 102 38 - 126 U/L   Total Bilirubin 2.9 (H) 0.3 - 1.2 mg/dL   GFR calc non Af Amer 36 (L) >60 mL/min   GFR calc Af Amer 42 (L) >60 mL/min   Anion gap 7 5 - 15  CBC  Result Value Ref Range   WBC 7.4 4.0 - 10.5 K/uL   RBC 3.84 (L) 4.22 - 5.81 MIL/uL   Hemoglobin 11.9 (L) 13.0 - 17.0 g/dL   HCT 34.0 (L) 39.0 - 52.0 %   MCV 88.5 78.0 - 100.0 fL   MCH 31.0 26.0 - 34.0 pg   MCHC 35.0 30.0 - 36.0 g/dL   RDW 15.9 (H) 11.5 - 15.5 %   Platelets 82 (L) 150 - 400 K/uL  Ethanol  Result Value Ref Range   Alcohol, Ethyl (B) <5 <5 mg/dL  Urine microscopic-add on  Result Value Ref Range   Squamous Epithelial / LPF FEW (A) RARE   WBC, UA 0-2 <3 WBC/hpf  RBC / HPF 0-2 <3 RBC/hpf   Bacteria, UA RARE RARE   Urine-Other AMORPHOUS  URATES/PHOSPHATES   CBG monitoring, ED  Result Value Ref Range   Glucose-Capillary 129 (H) 70 - 99 mg/dL  POC occult blood, ED  Result Value Ref Range   Fecal Occult Bld NEGATIVE NEGATIVE   Ct Head Wo Contrast  08/29/2014   CLINICAL DATA:  Patient found unresponsive on the floor today.  EXAM: CT HEAD WITHOUT CONTRAST  CT CERVICAL SPINE WITHOUT CONTRAST  TECHNIQUE: Multidetector CT imaging of the head and cervical spine was performed following the standard protocol without intravenous contrast. Multiplanar CT image reconstructions of the cervical spine were also generated.  COMPARISON:  Head CT scan 08/14/2014. Head and cervical spine CT scan 01/03/2004.  FINDINGS: CT HEAD FINDINGS  Cortical atrophy and chronic microvascular ischemic change are again seen. No evidence of acute intracranial abnormality including hemorrhage, infarct, mass lesion, mass effect, midline shift abnormal extra-axial fluid collection is identified. There is no hydrocephalus or pneumocephalus. Small left mastoid effusion is unchanged.  CT CERVICAL SPINE FINDINGS  There is no fracture or malalignment of the cervical spine. Intervertebral disc space height is maintained. Endotracheal tube and NG tube are in place. Lung apices are clear. Paraspinous soft tissue structures are unremarkable. Carotid atherosclerosis is noted.  IMPRESSION: No acute finding head or cervical spine.  Atrophy and chronic microvascular ischemic change.   Electronically Signed   By: Inge Rise M.D.   On: 08/29/2014 12:23   Ct Cervical Spine Wo Contrast  08/29/2014   CLINICAL DATA:  Patient found unresponsive on the floor today.  EXAM: CT HEAD WITHOUT CONTRAST  CT CERVICAL SPINE WITHOUT CONTRAST  TECHNIQUE: Multidetector CT imaging of the head and cervical spine was performed following the standard protocol without intravenous contrast. Multiplanar CT image reconstructions of the cervical spine were also generated.  COMPARISON:  Head CT scan 08/14/2014. Head  and cervical spine CT scan 01/03/2004.  FINDINGS: CT HEAD FINDINGS  Cortical atrophy and chronic microvascular ischemic change are again seen. No evidence of acute intracranial abnormality including hemorrhage, infarct, mass lesion, mass effect, midline shift abnormal extra-axial fluid collection is identified. There is no hydrocephalus or pneumocephalus. Small left mastoid effusion is unchanged.  CT CERVICAL SPINE FINDINGS  There is no fracture or malalignment of the cervical spine. Intervertebral disc space height is maintained. Endotracheal tube and NG tube are in place. Lung apices are clear. Paraspinous soft tissue structures are unremarkable. Carotid atherosclerosis is noted.  IMPRESSION: No acute finding head or cervical spine.  Atrophy and chronic microvascular ischemic change.   Electronically Signed   By: Inge Rise M.D.   On: 08/29/2014 12:23   Dg Chest Port 1 View  08/31/2014   CLINICAL DATA:  59 year old male with respiratory failure and shortness of breath.  EXAM: PORTABLE CHEST - 1 VIEW  COMPARISON:  08/29/2014 and prior radiographs  FINDINGS: There has been interval removal of an endotracheal tube, NG tube and left IJ central venous catheter.  Vascular congestion has decreased.  Mild bibasilar atelectasis again noted.  There is no evidence of pneumothorax or definite pleural effusion.  IMPRESSION: Support tube removal with decreased pulmonary vascular congestion.  Unchanged mild bibasilar atelectasis.   Electronically Signed   By: Margarette Canada M.D.   On: 08/31/2014 09:05   Dg Chest Port 1 View  08/29/2014   CLINICAL DATA:  Respiratory failure.  EXAM: PORTABLE CHEST - 1 VIEW  COMPARISON:  08/29/2014 at 11:28 a.m.  FINDINGS: Endotracheal tube with tip 4.2 cm above the carina. Enteric tube courses into the region of the stomach and off the inferior portion of the film. Interval placement of left-sided catheter over the left neck extending inferiorly following a course more consistent with  arterial location left of midline below the level of the carina possibly within the descending thoracic aorta or proximal ascending thoracic aorta.  Lungs are hypoinflated. No evidence of left pneumothorax. Mild prominence of the perihilar markings suggesting minimal vascular congestion. Cardiomediastinal silhouette and remainder of the exam is unchanged.  IMPRESSION: Hypoinflation with suggestion of mild vascular congestion.  Tubes and lines as described. Note that the left sided vascular catheter appears to take a course more consistent with arterial location possibly within the proximal ascending aorta or descending aorta. No pneumothorax.  Critical Value/emergent results were called by telephone at the time of interpretation on 08/29/2014 at 2:06 pm to Dr. Tanna Furry, who verbally acknowledged these results.   Electronically Signed   By: Marin Olp M.D.   On: 08/29/2014 14:06   Dg Chest Port 1 View  08/29/2014   CLINICAL DATA:  Unresponsive.  EXAM: PORTABLE CHEST - 1 VIEW  COMPARISON:  Radiograph 08/15/2014  FINDINGS: Endotracheal tube approximately 5.8 cm from carina. NG tube in stomach. No effusion, infiltrate, pneumothorax. Low lung volumes.  IMPRESSION: Endotracheal tube in appropriate position.  Low lung volumes.   Electronically Signed   By: Suzy Bouchard M.D.   On: 08/29/2014 11:41    MDM   suspect hepatic encephalopathy. Spoke with Dr.David plan admit telemetry Final diagnoses:  None  Dx#1Acute encephalopathy #2hyponatremia #3renal insufficiency #4 hyperbilirubinemia #5 ana sarca  CRITICAL CARE Performed by: Orlie Dakin Total critical care time: 30 minute Critical care time was exclusive of separately billable procedures and treating other patients. Critical care was necessary to treat or prevent imminent or life-threatening deterioration. Critical care was time spent personally by me on the following activities: development of treatment plan with patient and/or surrogate as  well as nursing, discussions with consultants, evaluation of patient's response to treatment, examination of patient, obtaining history from patient or surrogate, ordering and performing treatments and interventions, ordering and review of laboratory studies, ordering and review of radiographic studies, pulse oximetry and re-evaluation of patient's condition.    Orlie Dakin, MD 09/26/14 [phone]

## 2014-09-26 NOTE — Progress Notes (Signed)
Report called and received from ED nurse.  Shelbie Hutching, RN, BSN

## 2014-09-26 NOTE — ED Notes (Signed)
Per EMS pt from home called out for 10/10 abdominal pain & distention and swollen testicles x 6 weeks progressively increased. Pt endorses n/v/d x 1 week. Pt endorses mild nausea, no active vomiting or dry heaving.  Hx of cirrhosis, HTN, DM

## 2014-09-26 NOTE — H&P (Signed)
PCP:   Barbette Merino, MD   Chief Complaint:  Swelling and confusion  HPI: 59 yo male with h/o etoh cirrhosis of the liver, hepatic encephalopathy, noncompliance comes in with one month of worsening swelling in his testicles, abdomen and legs.  Mildly confused.  Says he has not drank any etoh in 3 years.  Also has not been taking his medications at home at all.  Came to the ED because he is unable to urinate.  Denies sob or pain anywhere.  No fevers.    Review of Systems:  Positive and negative as per HPI otherwise all other systems are negative  Past Medical History: Past Medical History  Diagnosis Date  . Neuropathy   . Diabetes mellitus   . Bipolar affect, depressed   . Hypertension   . Arthritis   . Stroke     Mini stroke about 21yrs ago  . Cirrhosis   . Alcohol abuse   . Chronic pain   . Cocaine abuse   . Muscle spasm     both legs  . Encephalopathy, hepatic   . Detached retina   . COPD (chronic obstructive pulmonary disease)     emphysema  . Bronchitis   . Barrett's esophagus   . GERD (gastroesophageal reflux disease)     has ulcer  . Anemia    Past Surgical History  Procedure Laterality Date  . Fracture surgery      Leg and arm 54yrs ago  . Esophagogastroduodenoscopy  04/04/2012    Procedure: ESOPHAGOGASTRODUODENOSCOPY (EGD);  Surgeon: Irene Shipper, MD;  Location: [facility] ENDOSCOPY;  Service: Endoscopy;  Laterality: N/A;  . Esophagogastroduodenoscopy Left 03/13/2013    Procedure: ESOPHAGOGASTRODUODENOSCOPY (EGD);  Surgeon: Arta Silence, MD;  Location: [facility] ENDOSCOPY;  Service: Endoscopy;  Laterality: Left;  Marland Kitchen Eye surgery  8 months ago both eyes    cataracts both eyes, detached eye, gas pocket  . Vasectomy    . Pars plana vitrectomy Left 07/08/2013    Procedure: PARS PLANA VITRECTOMY WITH 25 GAUGE;  Surgeon: Hurman Horn, MD;  Location: [place];  Service: Ophthalmology;  Laterality: Left;  . Intraocular lens removal Left 07/08/2013    Procedure: REMOVAL OF INTRAOCULAR  LENS;  Surgeon: Hurman Horn, MD;  Location: [place];  Service: Ophthalmology;  Laterality: Left;  . Placement and suture of secondary intraocular lens Left 07/08/2013    Procedure: PLACEMENT AND SUTURE OF SECONDARY INTRAOCULAR LENS;  Surgeon: Hurman Horn, MD;  Location: [place];  Service: Ophthalmology;  Laterality: Left;  Insertion of Anterior Capsule Intraocular Lens   . Esophagogastroduodenoscopy N/A 01/16/2014    Procedure: ESOPHAGOGASTRODUODENOSCOPY (EGD);  Surgeon: Winfield Cunas., MD;  Location: [facility] ENDOSCOPY;  Service: Endoscopy;  Laterality: N/A;  . Colonoscopy N/A 01/17/2014    Procedure: COLONOSCOPY;  Surgeon: Winfield Cunas., MD;  Location: [facility] ENDOSCOPY;  Service: Endoscopy;  Laterality: N/A;  possible banding  . Esophagogastroduodenoscopy N/A 08/30/2014    Procedure: ESOPHAGOGASTRODUODENOSCOPY (EGD);  Surgeon: Wilford Corner, MD;  Location: [facility] ENDOSCOPY;  Service: Endoscopy;  Laterality: N/A;  bedside    Medications: Prior to Admission medications   Medication Sig Start Date End Date Taking? Authorizing Provider  amitriptyline (ELAVIL) 25 MG tablet Take 25 mg by mouth at bedtime.   Yes Historical Provider, MD  clonazePAM (KLONOPIN) 0.5 MG tablet Take 0.5 tablets (0.25 mg total) by mouth 2 (two) times daily. 09/03/14  Yes Reyne Dumas, MD  diclofenac sodium (VOLTAREN) 1 % GEL Apply 4 g topically as  needed (for back of leg pain).   Yes Historical Provider, MD  furosemide (LASIX) 40 MG tablet Take 40 mg by mouth daily. 08/18/14  Yes Historical Provider, MD  insulin aspart (NOVOLOG FLEXPEN) 100 UNIT/ML FlexPen Inject 6 Units into the skin 3 (three) times daily with meals. For diabetes 02/19/14  Yes Encarnacion Slates, NP  Insulin Glargine (LANTUS SOLOSTAR) 100 UNIT/ML Solostar Pen Inject 20 units into the skin at bedtime: For diabetes 08/18/14  Yes Bonnielee Haff, MD  lactulose (CHRONULAC) 10 GM/15ML solution Take 45 mLs (30 g total) by mouth 3 (three) times daily. 09/03/14  Yes Reyne Dumas,  MD  Multiple Vitamin (MULTIVITAMIN WITH MINERALS) TABS tablet Take 1 tablet by mouth daily. 07/31/14  Yes Geradine Girt, DO  oxyCODONE (OXY IR/ROXICODONE) 5 MG immediate release tablet Take 1 tablet (5 mg total) by mouth every 8 (eight) hours as needed for moderate pain. 09/03/14  Yes Reyne Dumas, MD  pantoprazole (PROTONIX) 40 MG tablet Take 40 mg by mouth 2 (two) times daily. 07/09/14  Yes Historical Provider, MD  feeding supplement, ENSURE ENLIVE, (ENSURE ENLIVE) LIQD Take 237 mLs by mouth 2 (two) times daily between meals. Patient not taking: Reported on 09/26/2014 09/03/14   Reyne Dumas, MD  magnesium oxide (MAG-OX) 400 (241.3 MG) MG tablet Take 1 tablet (400 mg total) by mouth 2 (two) times daily. Patient not taking: Reported on 09/26/2014 09/03/14   Reyne Dumas, MD    Allergies:  No Known Allergies  Social History:  reports that he has been smoking Cigarettes.  He has a 30 pack-year smoking history. He has never used smokeless tobacco. He reports that he drinks alcohol. He reports that he uses illicit drugs (Cocaine).  Family History: Family History  Problem Relation Age of Onset  . Hypotension Mother     Physical Exam: Filed Vitals:   09/26/14 1630 09/26/14 1645 09/26/14 1700 09/26/14 1715  BP: 100/48 116/51 109/64 112/53  Pulse: 63 64 62 63  Temp:      TempSrc:      Resp: 12 12 12 11   Height:      Weight:      SpO2: 98% 97% 98% 96%   General appearance: alert, cooperative, no distress and slowed mentation  Diffuse severe anasarca Head: Normocephalic, without obvious abnormality, atraumatic Eyes: negative Nose: Nares normal. Septum midline. Mucosa normal. No drainage or sinus tenderness. Neck: no JVD and supple, symmetrical, trachea midline Lungs: clear to auscultation bilaterally Heart: regular rate and rhythm, S1, S2 normal, no murmur, click, rub or gallop Abdomen: soft, non-tender; bowel sounds normal; no masses,  no organomegaly Extremities: edema 4+ up to  thighs Pulses: 2+ and symmetric Skin: Skin color, texture, turgor normal. No rashes or lesions Neurologic: Grossly normal  Mildly slowed mentations, oriented to person place and time.    Labs on Admission:   Recent Labs  09/26/14 1618  NA 126*  K 4.1  CL 96*  CO2 23  GLUCOSE 134*  BUN 39*  CREATININE 1.94*  CALCIUM 8.0*    Recent Labs  09/26/14 1618  AST 67*  ALT 25  ALKPHOS 102  BILITOT 2.9*  PROT 5.8*  ALBUMIN 2.1*    Recent Labs  09/26/14 1618  WBC 7.4  HGB 11.9*  HCT 34.0*  MCV 88.5  PLT 82*    Radiological Exams on Admission:    Assessment/Plan  59 yo male with advanced decompensated cirrhosis of the liver secondary to h/o etoh abuse with possible hepatorenal syndrome and  encephalopathy  Principal Problem:   Acute encephalopathy-  Ammonia level is pending but very likely will be elevated.  Restart lactulose.  Likely due to hepatic encephalopathy.  No other focal neurological deficits, follows simple commands, just slow to respond overall.  Active Problems:   H/o Alcohol abuse-  Reports sober for 3 years.  Place on CIWA just in case not.  etoh level in negative.   Diabetes mellitus-  ssi.   Acute kidney injury-  Possible hepatorenal syndrome.  Given some ivf in the ED, ua is negative.  Grossly fluid overloaded.  Resume lasix, follow cr closely.   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  Foley has been placed.     Medically noncompliant noted  Overall very poor prognosis.  Consider palliative care consult.  Admit to tele bed.  Presumptive full code due to mild confusion.  Jaxyn Rout A 09/26/2014, 7:05 PM

## 2014-09-27 ENCOUNTER — Inpatient Hospital Stay (HOSPITAL_COMMUNITY): Payer: Medicare PPO

## 2014-09-27 DIAGNOSIS — N179 Acute kidney failure, unspecified: Secondary | ICD-10-CM

## 2014-09-27 LAB — COMPREHENSIVE METABOLIC PANEL
ALBUMIN: 1.7 g/dL — AB (ref 3.5–5.0)
ALK PHOS: 78 U/L (ref 38–126)
ALT: 22 U/L (ref 17–63)
ANION GAP: 6 (ref 5–15)
AST: 52 U/L — ABNORMAL HIGH (ref 15–41)
BUN: 40 mg/dL — ABNORMAL HIGH (ref 6–20)
CO2: 23 mmol/L (ref 22–32)
Calcium: 7.5 mg/dL — ABNORMAL LOW (ref 8.9–10.3)
Chloride: 99 mmol/L — ABNORMAL LOW (ref 101–111)
Creatinine, Ser: 1.77 mg/dL — ABNORMAL HIGH (ref 0.61–1.24)
GFR calc Af Amer: 47 mL/min — ABNORMAL LOW (ref >60)
GFR calc non Af Amer: 41 mL/min — ABNORMAL LOW (ref >60)
Glucose, Bld: 89 mg/dL (ref 70–99)
POTASSIUM: 3.5 mmol/L (ref 3.5–5.1)
SODIUM: 128 mmol/L — AB (ref 135–145)
TOTAL PROTEIN: 4.4 g/dL — AB (ref 6.5–8.1)
Total Bilirubin: 3.1 mg/dL — ABNORMAL HIGH (ref 0.3–1.2)

## 2014-09-27 LAB — BODY FLUID CELL COUNT WITH DIFFERENTIAL
Eos, Fluid: 0 %
LYMPHS FL: 41 %
MONOCYTE-MACROPHAGE-SEROUS FLUID: 54 % (ref 50–90)
Neutrophil Count, Fluid: 5 % (ref 0–25)
Total Nucleated Cell Count, Fluid: 81 cu mm (ref 0–1000)

## 2014-09-27 LAB — CBC
HEMATOCRIT: 29 % — AB (ref 39.0–52.0)
HEMOGLOBIN: 9.8 g/dL — AB (ref 13.0–17.0)
MCH: 30.4 pg (ref 26.0–34.0)
MCHC: 33.8 g/dL (ref 30.0–36.0)
MCV: 90.1 fL (ref 78.0–100.0)
Platelets: 62 10*3/uL — ABNORMAL LOW (ref 150–400)
RBC: 3.22 MIL/uL — AB (ref 4.22–5.81)
RDW: 16 % — ABNORMAL HIGH (ref 11.5–15.5)
WBC: 4.6 10*3/uL (ref 4.0–10.5)

## 2014-09-27 LAB — GLUCOSE, CAPILLARY
GLUCOSE-CAPILLARY: 103 mg/dL — AB (ref 70–99)
GLUCOSE-CAPILLARY: 143 mg/dL — AB (ref 70–99)
GLUCOSE-CAPILLARY: 145 mg/dL — AB (ref 70–99)
GLUCOSE-CAPILLARY: 182 mg/dL — AB (ref 70–99)
Glucose-Capillary: 82 mg/dL (ref 70–99)

## 2014-09-27 LAB — PROTEIN, BODY FLUID: Total protein, fluid: <3 g/dL

## 2014-09-27 MED ORDER — HYDROCODONE-ACETAMINOPHEN 5-325 MG PO TABS
1.0000 | ORAL_TABLET | Freq: Four times a day (QID) | ORAL | Status: DC | PRN
  Administered 2014-09-27 – 2014-10-07 (×17): 1 via ORAL
  Filled 2014-09-27 (×17): qty 1

## 2014-09-27 MED ORDER — ONDANSETRON HCL 4 MG/2ML IJ SOLN
4.0000 mg | Freq: Four times a day (QID) | INTRAMUSCULAR | Status: DC | PRN
  Administered 2014-09-27 – 2014-10-02 (×4): 4 mg via INTRAVENOUS
  Filled 2014-09-27 (×6): qty 2

## 2014-09-27 MED ORDER — LIDOCAINE HCL (PF) 1 % IJ SOLN
INTRAMUSCULAR | Status: AC
  Filled 2014-09-27: qty 10

## 2014-09-27 NOTE — Procedures (Signed)
US paracentesis RLQ No complication No blood loss. See complete dictation in [facility].

## 2014-09-27 NOTE — Progress Notes (Signed)
Admission note:  Arrival Method: Pt arrived on stretcher from ED with nurse tech Mental Orientation: Alert and oriented x 4 Telemetry: Telemetry box 6E07 applied. CCMD notified  Assessment: See doc flowsheets  Skin: Abrasion to right hip, stage 2 pressure wound to left buttock. Foam dressings applied. See flowsheets for full assessment.  IV: Left AC IV. Clean, dry and intact.  Pain: States no pain at this time Tubes: Ostomy to right mid abdomen Safety Measures: Bed in lowest position, non-skid socks placed, call light within reach Fall Prevention Safety Plan: Reviewed with patient Admission Screening: Completed 6700 Orientation: Patient has been oriented to the unit, staff and to the room. Patient lying in bed, tearful at times stating "he just wants to get better". Emotional support given. Patient states no needs at this time. Call light within reach, will continue to monitor.   Shelbie Hutching, RN, BSN

## 2014-09-27 NOTE — Progress Notes (Addendum)
[facility] PROGRESS NOTE  [name] OIZ:[id number] DOB: 1956/02/22 DOA: 09/26/2014 PCP: Barbette Merino, MD  Assessment/Plan: Anasarca/worsening ascites/scrotal swelling -due to cirrhosis, hypoalbuminemia (1.7), diastolic CHF -continue IV lasix, monitor creatinine -Paracentesis today-diagnostic and therapeutic -Rd consult -follow I/Os, weights closely  AKi on CKD -baseline creatinine normal -due to third spacing/intravascular volume depletion, improved with IVF overnight -at risk of hepatorenal syndrome, urine lytes not reliable since on lasix -continue low dose lasix, monitor Bmet, urine output -may need renal involvement this admission  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 Minimal nonbleeding esophageal varices -continue PPI, monitor Hb  Thrombocytopenia -Secondary to liver cirrhosis   Substance abuse -UDS + for benzos and cocaine in past -reported on narcotics per PCP for chronic pain  COPD -stable  Diabetes mellitus, type II -Hemoglobin A1c 5.9 (08/14/2014) -Continue insulin sliding scale CBG monitoring  Chronic Diastolic CHF -Echocardiogram 08/15/2014: EF 60-65%. Grade 1 diastolic dysfunction  -Patient does have lower extremity swelling which she states is chronic -diuresis as tolerated as above  Hx bipolar d/o  Code Status: Full Code Family Communication: none at bedside Disposition Plan: home when stable   Procedures:  Paracentesis pending  HPI/Subjective: Scrotal swelling  Objective: Filed Vitals:   09/27/14 1018  BP: 102/41  Pulse:   Temp:   Resp:     Intake/Output Summary (Last 24 hours) at 09/27/14 1159 Last data filed at 09/27/14 0945  Gross per 24 hour  Intake      0 ml  Output   1800 ml  Net  -1800 ml   Filed  Weights   09/26/14 1521 09/26/14 2023  Weight: 116.302 kg (256 lb 6.4 oz) 119.886 kg (264 lb 4.8 oz)    Exam:   General:  AAox3  Cardiovascular: S1S2/RRR  Respiratory: diminished BS at bases  Abdomen: soft, obese, distended, NT, BS present  Musculoskeletal: 3plus edema  Data Reviewed: Basic Metabolic Panel:  Recent Labs Lab 09/26/14 1618 09/27/14 0528  NA 126* 128*  K 4.1 3.5  CL 96* 99*  CO2 23 23  GLUCOSE 134* 89  BUN 39* 40*  CREATININE 1.94* 1.77*  CALCIUM 8.0* 7.5*   Liver Function Tests:  Recent Labs Lab 09/26/14 1618 09/27/14 0528  AST 67* 52*  ALT 25 22  ALKPHOS 102 78  BILITOT 2.9* 3.1*  PROT 5.8* 4.4*  ALBUMIN 2.1* 1.7*   No results for input(s): LIPASE, AMYLASE in the last 168 hours.  Recent Labs Lab 09/26/14 2049  AMMONIA 47*   CBC:  Recent Labs Lab 09/26/14 1618 09/27/14 0528  WBC 7.4 4.6  HGB 11.9* 9.8*  HCT 34.0* 29.0*  MCV 88.5 90.1  PLT 82* 62*   Cardiac Enzymes: No results for input(s): CKTOTAL, CKMB, CKMBINDEX, TROPONINI in the last 168 hours. BNP (last 3 results)  Recent Labs  08/14/14 1910  BNP 29.3    ProBNP (last 3 results)  Recent Labs  02/08/14 1830 02/10/14 1547  PROBNP 966.3* 805.5*    CBG:  Recent Labs Lab 09/26/14 1603 09/26/14 2018 09/26/14 2354 09/27/14 0753  GLUCAP 129* 107* 103* 82    No results found for this or any previous visit (from the past 240 hour(s)).   Studies: US Paracentesis  09/27/2014   CLINICAL DATA:  Recurrent large volume ascites. Cirrhosis. Abdominaldistention.  EXAM: ULTRASOUND GUIDED PARACENTESIS  TECHNIQUE: The procedure, risks (including  but not limited to bleeding, infection, organ damage ), benefits, and alternatives were explained to the patient. Questions regarding the procedure were encouraged and answered. The patient understands and consents to the procedure. Survey ultrasound of the abdomen was performed and an appropriate skin entry site in the right lower  abdomen was selected. Skin site was marked, prepped with Betadine, and draped in usual sterile fashion, and infiltrated locally with 1% lidocaine. A Safe-T-Centesis needle was advanced into the peritoneal space until fluid could be aspirated. The sheath was advanced and the needle removed. 4.5 L of clear yellowascites were aspirated. Samples sent for the requested laboratory studies.  COMPLICATIONS: COMPLICATIONS none  IMPRESSION: Technically successful ultrasound guided paracentesis, removing 4.5 L of ascites.   Electronically Signed   By: Lucrezia Europe M.D.   On: 09/27/2014 10:54    Scheduled Meds: . folic acid  1 mg Oral Daily  . furosemide  40 mg Intravenous Daily  . insulin aspart  0-9 Units Subcutaneous 6 times per day  . lactulose  30 g Oral TID  . lidocaine (PF)      . multivitamin with minerals  1 tablet Oral Daily  . pantoprazole  40 mg Oral BID  . sodium chloride  3 mL Intravenous Q12H  . sodium chloride  3 mL Intravenous Q12H  . thiamine  100 mg Oral Daily   Continuous Infusions:  Antibiotics Given (last 72 hours)    None      Principal Problem:   Acute encephalopathy Active Problems:   Alcohol abuse   Diabetes mellitus   Acute kidney injury   Hx of bipolar disorder   Alcoholic cirrhosis of liver with ascites   Medically noncompliant    Time spent: 36min    Sully Manzi  [facility] Pager [phone]. If 7PM-7AM, please contact night-coverage at [email or web address], password [facility] 09/27/2014, 11:59 AM  LOS: 1 day

## 2014-09-28 LAB — CBC
HEMATOCRIT: 29.7 % — AB (ref 39.0–52.0)
HEMOGLOBIN: 10.1 g/dL — AB (ref 13.0–17.0)
MCH: 30.4 pg (ref 26.0–34.0)
MCHC: 34 g/dL (ref 30.0–36.0)
MCV: 89.5 fL (ref 78.0–100.0)
PLATELETS: 59 10*3/uL — AB (ref 150–400)
RBC: 3.32 MIL/uL — ABNORMAL LOW (ref 4.22–5.81)
RDW: 16.1 % — ABNORMAL HIGH (ref 11.5–15.5)
WBC: 4.7 10*3/uL (ref 4.0–10.5)

## 2014-09-28 LAB — GLUCOSE, CAPILLARY
GLUCOSE-CAPILLARY: 130 mg/dL — AB (ref 70–99)
GLUCOSE-CAPILLARY: 165 mg/dL — AB (ref 70–99)
GLUCOSE-CAPILLARY: 200 mg/dL — AB (ref 70–99)
Glucose-Capillary: 144 mg/dL — ABNORMAL HIGH (ref 70–99)
Glucose-Capillary: 160 mg/dL — ABNORMAL HIGH (ref 70–99)
Glucose-Capillary: 199 mg/dL — ABNORMAL HIGH (ref 70–99)

## 2014-09-28 LAB — COMPREHENSIVE METABOLIC PANEL
ALT: 20 U/L (ref 17–63)
ANION GAP: 8 (ref 5–15)
AST: 53 U/L — ABNORMAL HIGH (ref 15–41)
Albumin: 1.7 g/dL — ABNORMAL LOW (ref 3.5–5.0)
Alkaline Phosphatase: 82 U/L (ref 38–126)
BILIRUBIN TOTAL: 2.2 mg/dL — AB (ref 0.3–1.2)
BUN: 38 mg/dL — ABNORMAL HIGH (ref 6–20)
CO2: 21 mmol/L — ABNORMAL LOW (ref 22–32)
Calcium: 7.9 mg/dL — ABNORMAL LOW (ref 8.9–10.3)
Chloride: 102 mmol/L (ref 101–111)
Creatinine, Ser: 1.57 mg/dL — ABNORMAL HIGH (ref 0.61–1.24)
GFR calc Af Amer: 54 mL/min — ABNORMAL LOW (ref >60)
GFR calc non Af Amer: 47 mL/min — ABNORMAL LOW (ref >60)
GLUCOSE: 143 mg/dL — AB (ref 70–99)
Potassium: 3.8 mmol/L (ref 3.5–5.1)
SODIUM: 131 mmol/L — AB (ref 135–145)
TOTAL PROTEIN: 4.6 g/dL — AB (ref 6.5–8.1)

## 2014-09-28 MED ORDER — LORAZEPAM 1 MG PO TABS
2.0000 mg | ORAL_TABLET | Freq: Once | ORAL | Status: AC
Start: 2014-09-28 — End: 2014-09-28
  Administered 2014-09-28: 2 mg via ORAL
  Filled 2014-09-28: qty 2

## 2014-09-28 MED ORDER — FUROSEMIDE 10 MG/ML IJ SOLN
40.0000 mg | Freq: Two times a day (BID) | INTRAMUSCULAR | Status: DC
  Administered 2014-09-28 – 2014-10-03 (×10): 40 mg via INTRAVENOUS
  Filled 2014-09-28 (×11): qty 4

## 2014-09-28 MED ORDER — LACTULOSE 10 GM/15ML PO SOLN
20.0000 g | Freq: Two times a day (BID) | ORAL | Status: DC
  Administered 2014-09-28 – 2014-10-07 (×18): 20 g via ORAL
  Filled 2014-09-28 (×19): qty 30

## 2014-09-28 NOTE — Progress Notes (Signed)
[facility] PROGRESS NOTE  [name] BLT:[id number] DOB: 21-Feb-1956 DOA: 09/26/2014 PCP: Barbette Merino, MD  Assessment/Plan: Anasarca/worsening ascites/scrotal swelling -due to cirrhosis, hypoalbuminemia (1.7), diastolic CHF -continue IV lasix, negative 2.4L,  -monitor creatinine -s/p Paracentesis 5/7, 4.5L removed, no SBP -Rd consult -follow I/Os, weights closely -scrotal swelling unchanged yet, may take a while  AKi on CKD -baseline creatinine normal -due to third spacing/intravascular volume depletion, improved with IVF overnight -at risk of hepatorenal syndrome, urine lytes not reliable since on lasix -continue low dose lasix, improving creatinine so far -monitor Bmet, urine output -may need renal involvement this admission, no need at this point  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 Minimal nonbleeding esophageal varices -continue PPI, monitor Hb  Thrombocytopenia -Secondary to liver cirrhosis   Substance abuse -UDS + for benzos and cocaine in past -reported on narcotics per PCP for chronic pain  COPD -stable  Diabetes mellitus, type II -Hemoglobin A1c 5.9 (08/14/2014) -Continue insulin sliding scale CBG monitoring  Chronic Diastolic CHF -Echocardiogram 08/15/2014: EF 60-65%. Grade 1 diastolic dysfunction  -Patient does have lower extremity swelling which she states is chronic -diuresis as tolerated as above  Hx bipolar d/o  Code Status: Full Code Family Communication: none at bedside Disposition Plan: home when stable   Procedures:  Paracentesis pending  HPI/Subjective: Scrotal swelling  Objective: Filed Vitals:   09/28/14 0940  BP: 126/75  Pulse: 68  Temp: 97.5 F (36.4 C)  Resp: 18    Intake/Output  Summary (Last 24 hours) at 09/28/14 1044 Last data filed at 09/28/14 0900  Gross per 24 hour  Intake    480 ml  Output   1278 ml  Net   -798 ml   Filed Weights   09/26/14 1521 09/26/14 2023 09/27/14 2300  Weight: 116.302 kg (256 lb 6.4 oz) 119.886 kg (264 lb 4.8 oz) 113.309 kg (249 lb 12.8 oz)    Exam:   General:  AAox3  Cardiovascular: S1S2/RRR  Respiratory: diminished BS at bases  Abdomen: soft, obese, distended, NT, BS present  Musculoskeletal: 3plus edema  Data Reviewed: Basic Metabolic Panel:  Recent Labs Lab 09/26/14 1618 09/27/14 0528 09/28/14 0520  NA 126* 128* 131*  K 4.1 3.5 3.8  CL 96* 99* 102  CO2 23 23 21*  GLUCOSE 134* 89 143*  BUN 39* 40* 38*  CREATININE 1.94* 1.77* 1.57*  CALCIUM 8.0* 7.5* 7.9*   Liver Function Tests:  Recent Labs Lab 09/26/14 1618 09/27/14 0528 09/28/14 0520  AST 67* 52* 53*  ALT 25 22 20   ALKPHOS 102 78 82  BILITOT 2.9* 3.1* 2.2*  PROT 5.8* 4.4* 4.6*  ALBUMIN 2.1* 1.7* 1.7*   No results for input(s): LIPASE, AMYLASE in the last 168 hours.  Recent Labs Lab 09/26/14 2049  AMMONIA 47*   CBC:  Recent Labs Lab 09/26/14 1618 09/27/14 0528 09/28/14 0520  WBC 7.4 4.6 4.7  HGB 11.9* 9.8* 10.1*  HCT 34.0* 29.0* 29.7*  MCV 88.5 90.1 89.5  PLT 82* 62* 59*   Cardiac Enzymes: No results for input(s): CKTOTAL, CKMB, CKMBINDEX, TROPONINI in the last 168 hours. BNP (last 3 results)  Recent Labs  08/14/14 1910  BNP 29.3    ProBNP (last 3 results)  Recent Labs  02/08/14 1830 02/10/14 1547  PROBNP 966.3* 805.5*    CBG:  Recent Labs  Lab 09/27/14 1554 09/27/14 2016 09/28/14 0009 09/28/14 0441 09/28/14 0732  GLUCAP 182* 145* 160* 144* 130*    Recent Results (from the past 240 hour(s))  Body fluid culture     Status: None (Preliminary result)   Collection Time: 09/27/14 10:14 AM  Result Value Ref Range Status   Specimen Description PERITONEAL FLUID  Final   Special Requests NONE  Final   Gram  Stain   Final    FEW WBC PRESENT, PREDOMINANTLY MONONUCLEAR NO ORGANISMS SEEN Performed at [facility]    Culture NO GROWTH Performed at [facility]   Final   Report Status PENDING  Incomplete     Studies: US Paracentesis  09/27/2014   CLINICAL DATA:  Recurrent large volume ascites. Cirrhosis. Abdominaldistention.  EXAM: ULTRASOUND GUIDED PARACENTESIS  TECHNIQUE: The procedure, risks (including but not limited to bleeding, infection, organ damage ), benefits, and alternatives were explained to the patient. Questions regarding the procedure were encouraged and answered. The patient understands and consents to the procedure. Survey ultrasound of the abdomen was performed and an appropriate skin entry site in the right lower abdomen was selected. Skin site was marked, prepped with Betadine, and draped in usual sterile fashion, and infiltrated locally with 1% lidocaine. A Safe-T-Centesis needle was advanced into the peritoneal space until fluid could be aspirated. The sheath was advanced and the needle removed. 4.5 L of clear yellowascites were aspirated. Samples sent for the requested laboratory studies.  COMPLICATIONS: COMPLICATIONS none  IMPRESSION: Technically successful ultrasound guided paracentesis, removing 4.5 L of ascites.   Electronically Signed   By: Lucrezia Europe M.D.   On: 09/27/2014 10:54    Scheduled Meds: . folic acid  1 mg Oral Daily  . furosemide  40 mg Intravenous Q12H  . insulin aspart  0-9 Units Subcutaneous 6 times per day  . lactulose  20 g Oral BID  . multivitamin with minerals  1 tablet Oral Daily  . pantoprazole  40 mg Oral BID  . sodium chloride  3 mL Intravenous Q12H  . sodium chloride  3 mL Intravenous Q12H  . thiamine  100 mg Oral Daily   Continuous Infusions:  Antibiotics Given (last 72 hours)    None      Principal Problem:   Acute encephalopathy Active Problems:   Alcohol abuse   Diabetes mellitus   Acute kidney injury   Hx of bipolar  disorder   Alcoholic cirrhosis of liver with ascites   Medically noncompliant    Time spent: 41min    [name]  [facility] Pager [phone]. If 7PM-7AM, please contact night-coverage at [email or web address], password [name] 09/28/2014, 10:44 AM  LOS: 2 days

## 2014-09-29 LAB — GLUCOSE, CAPILLARY
GLUCOSE-CAPILLARY: 124 mg/dL — AB (ref 70–99)
GLUCOSE-CAPILLARY: 159 mg/dL — AB (ref 70–99)
Glucose-Capillary: 116 mg/dL — ABNORMAL HIGH (ref 70–99)
Glucose-Capillary: 189 mg/dL — ABNORMAL HIGH (ref 70–99)
Glucose-Capillary: 162 mg/dL — ABNORMAL HIGH (ref 70–99)

## 2014-09-29 LAB — BASIC METABOLIC PANEL
Anion gap: 7 (ref 5–15)
BUN: 32 mg/dL — AB (ref 6–20)
CO2: 23 mmol/L (ref 22–32)
Calcium: 8 mg/dL — ABNORMAL LOW (ref 8.9–10.3)
Chloride: 104 mmol/L (ref 101–111)
Creatinine, Ser: 1.4 mg/dL — ABNORMAL HIGH (ref 0.61–1.24)
GFR calc Af Amer: >60 mL/min (ref >60)
GFR calc non Af Amer: 54 mL/min — ABNORMAL LOW (ref >60)
GLUCOSE: 120 mg/dL — AB (ref 70–99)
Potassium: 3.7 mmol/L (ref 3.5–5.1)
Sodium: 134 mmol/L — ABNORMAL LOW (ref 135–145)

## 2014-09-29 LAB — CBC
HCT: 30.3 % — ABNORMAL LOW (ref 39.0–52.0)
Hemoglobin: 10.2 g/dL — ABNORMAL LOW (ref 13.0–17.0)
MCH: 30.1 pg (ref 26.0–34.0)
MCHC: 33.7 g/dL (ref 30.0–36.0)
MCV: 89.4 fL (ref 78.0–100.0)
PLATELETS: 61 10*3/uL — AB (ref 150–400)
RBC: 3.39 MIL/uL — ABNORMAL LOW (ref 4.22–5.81)
RDW: 16.2 % — ABNORMAL HIGH (ref 11.5–15.5)
WBC: 4.4 10*3/uL (ref 4.0–10.5)

## 2014-09-29 LAB — PATHOLOGIST SMEAR REVIEW: Path Review: REACTIVE

## 2014-09-29 MED ORDER — GLUCERNA SHAKE PO LIQD
237.0000 mL | Freq: Two times a day (BID) | ORAL | Status: DC
  Administered 2014-09-29 – 2014-10-07 (×9): 237 mL via ORAL

## 2014-09-29 NOTE — Progress Notes (Addendum)
[facility] PROGRESS NOTE  [name] JGG:[id number] DOB: 1956-04-15 DOA: 09/26/2014 PCP: Barbette Merino, MD  Assessment/Plan: Anasarca/worsening ascites/scrotal swelling -due to cirrhosis, hypoalbuminemia (1.7), diastolic CHF -improving -continue IV lasix, negative >4L,  -s/p Paracentesis 5/7, 4.5L removed, no SBP -Rd consult -follow I/Os, weights closely -scrotal swelling unchanged yet, may take a while  AKi on CKD -baseline creatinine normal -due to third spacing/intravascular volume depletion, improved with IVF overnight -at risk of hepatorenal syndrome, urine lytes not reliable since on lasix -continue IV lasix, creatinine continues to improve -monitor Bmet, urine output  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 Minimal nonbleeding esophageal varices -continue PPI, monitor Hb  Thrombocytopenia -Secondary to liver cirrhosis   Substance abuse -UDS + for benzos and cocaine in past -reported on narcotics per PCP for chronic pain  COPD -stable  Diabetes mellitus, type II -Hemoglobin A1c 5.9 (08/14/2014) -Continue insulin sliding scale CBG monitoring  Chronic Diastolic CHF -Echocardiogram 08/15/2014: EF 60-65%. Grade 1 diastolic dysfunction  -Patient does have lower extremity swelling which she states is chronic -diuresis as tolerated as above  Hx bipolar d/o  Code Status: Full Code Family Communication: none at bedside Disposition Plan: home in 1-2days   Procedures:  Paracentesis 4.5L on 5/7  HPI/Subjective: Scrotal swelling improving, feels better  Objective: Filed Vitals:   09/29/14 0939  BP: 126/71  Pulse: 91  Temp: 98.1 F (36.7 C)  Resp: 16    Intake/Output Summary (Last 24 hours) at 09/29/14 0947 Last data  filed at 09/29/14 5465  Gross per 24 hour  Intake    820 ml  Output   2604 ml  Net  -1784 ml   Filed Weights   09/26/14 2023 09/27/14 2300 09/29/14 0644  Weight: 119.886 kg (264 lb 4.8 oz) 113.309 kg (249 lb 12.8 oz) 112.175 kg (247 lb 4.8 oz)    Exam:   General:  AAox3  Cardiovascular: S1S2/RRR  Respiratory: diminished BS at bases  Abdomen: soft, obese, distended, NT, BS present  Musculoskeletal: 3plus edema  Data Reviewed: Basic Metabolic Panel:  Recent Labs Lab 09/26/14 1618 09/27/14 0528 09/28/14 0520 09/29/14 0551  NA 126* 128* 131* 134*  K 4.1 3.5 3.8 3.7  CL 96* 99* 102 104  CO2 23 23 21* 23  GLUCOSE 134* 89 143* 120*  BUN 39* 40* 38* 32*  CREATININE 1.94* 1.77* 1.57* 1.40*  CALCIUM 8.0* 7.5* 7.9* 8.0*   Liver Function Tests:  Recent Labs Lab 09/26/14 1618 09/27/14 0528 09/28/14 0520  AST 67* 52* 53*  ALT 25 22 20   ALKPHOS 102 78 82  BILITOT 2.9* 3.1* 2.2*  PROT 5.8* 4.4* 4.6*  ALBUMIN 2.1* 1.7* 1.7*   No results for input(s): LIPASE, AMYLASE in the last 168 hours.  Recent Labs Lab 09/26/14 2049  AMMONIA 47*   CBC:  Recent Labs Lab 09/26/14 1618 09/27/14 0528 09/28/14 0520 09/29/14 0551  WBC 7.4 4.6 4.7 4.4  HGB 11.9* 9.8* 10.1* 10.2*  HCT 34.0* 29.0* 29.7* 30.3*  MCV 88.5 90.1 89.5 89.4  PLT 82* 62* 59* 61*   Cardiac Enzymes: No results for input(s): CKTOTAL, CKMB, CKMBINDEX, TROPONINI in the last 168 hours. BNP (last 3 results)  Recent Labs  08/14/14 1910  BNP 29.3    ProBNP (last 3 results)  Recent Labs  02/08/14 1830 02/10/14 1547  PROBNP 966.3*  805.5*    CBG:  Recent Labs Lab 09/28/14 1154 09/28/14 1613 09/28/14 2334 09/29/14 0420 09/29/14 0754  GLUCAP 199* 200* 165* 124* 116*    Recent Results (from the past 240 hour(s))  Body fluid culture     Status: None (Preliminary result)   Collection Time: 09/27/14 10:14 AM  Result Value Ref Range Status   Specimen Description PERITONEAL FLUID  Final    Special Requests NONE  Final   Gram Stain   Final    FEW WBC PRESENT, PREDOMINANTLY MONONUCLEAR NO ORGANISMS SEEN Performed at [facility]    Culture NO GROWTH Performed at [facility]   Final   Report Status PENDING  Incomplete     Studies: US Paracentesis  09/27/2014   CLINICAL DATA:  Recurrent large volume ascites. Cirrhosis. Abdominaldistention.  EXAM: ULTRASOUND GUIDED PARACENTESIS  TECHNIQUE: The procedure, risks (including but not limited to bleeding, infection, organ damage ), benefits, and alternatives were explained to the patient. Questions regarding the procedure were encouraged and answered. The patient understands and consents to the procedure. Survey ultrasound of the abdomen was performed and an appropriate skin entry site in the right lower abdomen was selected. Skin site was marked, prepped with Betadine, and draped in usual sterile fashion, and infiltrated locally with 1% lidocaine. A Safe-T-Centesis needle was advanced into the peritoneal space until fluid could be aspirated. The sheath was advanced and the needle removed. 4.5 L of clear yellowascites were aspirated. Samples sent for the requested laboratory studies.  COMPLICATIONS: COMPLICATIONS none  IMPRESSION: Technically successful ultrasound guided paracentesis, removing 4.5 L of ascites.   Electronically Signed   By: Lucrezia Europe M.D.   On: 09/27/2014 10:54    Scheduled Meds: . folic acid  1 mg Oral Daily  . furosemide  40 mg Intravenous BID  . insulin aspart  0-9 Units Subcutaneous 6 times per day  . lactulose  20 g Oral BID  . multivitamin with minerals  1 tablet Oral Daily  . pantoprazole  40 mg Oral BID  . sodium chloride  3 mL Intravenous Q12H  . sodium chloride  3 mL Intravenous Q12H  . thiamine  100 mg Oral Daily   Continuous Infusions:  Antibiotics Given (last 72 hours)    None      Principal Problem:   Acute encephalopathy Active Problems:   Alcohol abuse   Diabetes mellitus    Acute kidney injury   Hx of bipolar disorder   Alcoholic cirrhosis of liver with ascites   Medically noncompliant    Time spent: 60min    [name]  [facility] Pager [phone]. If 7PM-7AM, please contact night-coverage at [email or web address], password [facility] 09/29/2014, 9:47 AM  LOS: 3 days

## 2014-09-29 NOTE — Progress Notes (Signed)
Initial Nutrition Assessment  DOCUMENTATION CODES:  Obesity unspecified  INTERVENTION:  Glucerna shake (BID), each supplement provides 220 kcal and 10 grams of protein.  Diabetes diet education handout given.  NUTRITION DIAGNOSIS:  Increased nutrient needs related to chronic illness as evidenced by estimated needs.   GOAL:  Patient will meet greater than or equal to 90% of their needs   MONITOR:  PO intake, Labs, Weight trends, I & O's, Skin  REASON FOR ASSESSMENT:  Consult Diet education  ASSESSMENT: Pt with h/o etoh cirrhosis of the liver, hepatic encephalopathy, noncompliance comes in with one month of worsening swelling in his testicles, abdomen and legs. Mildly confused.  Pt was emotional and was having anxiety during time of visit. RD was consulted for a diet education. Education was no appropriate during time of visit. Handout "Carbohydrate counting for people with diabetes" was set on table by bedside. Pt reports having a good appetite currently and PTA with 3 meals a day with no other difficulties. Noted pt with pressure ulcer and swelling. Pt is agreeable to Glucerna Shake. RD to order.   Labs: Low calcium and GFR. High BUN and creatinine.  Height:  Ht Readings from Last 1 Encounters:  09/26/14 5\' 11"  (1.803 m)    Weight:  Wt Readings from Last 1 Encounters:  09/29/14 247 lb 4.8 oz (112.175 kg)    Ideal Body Weight:  78 kg  Wt Readings from Last 10 Encounters:  09/29/14 247 lb 4.8 oz (112.175 kg)  09/03/14 225 lb 12 oz (102.4 kg)  08/17/14 211 lb (95.709 kg)  07/31/14 220 lb 4.8 oz (99.927 kg)  05/26/14 211 lb (95.709 kg)  03/30/14 215 lb (97.523 kg)  02/14/14 230 lb (104.327 kg)  02/12/14 230 lb (104.327 kg)  02/08/14 230 lb (104.327 kg)  02/05/14 206 lb (93.441 kg)    BMI:  Body mass index is 34.51 kg/(m^2). Class I obesity  Estimated Nutritional Needs:  Kcal:  2000-2200  Protein:  125-140 lbs  Fluid:  1.5 L/day  Skin:  Wound  (see comment) (Stage II pressure ulcer on buttocks, +2 edema)  Diet Order:  Diet heart healthy/carb modified Room service appropriate?: Yes; Fluid consistency:: Thin; Fluid restriction:: 1500 mL Fluid  EDUCATION NEEDS:  Education needs no appropriate at this time   Intake/Output Summary (Last 24 hours) at 09/29/14 1425 Last data filed at 09/29/14 1239  Gross per 24 hour  Intake   1300 ml  Output   2653 ml  Net  -1353 ml    Last BM:  5/6  Kallie Locks, MS, RD, LDN Pager # [phone] After hours/ weekend pager # [phone]

## 2014-09-30 LAB — CBC
HCT: 29.5 % — ABNORMAL LOW (ref 39.0–52.0)
Hemoglobin: 10 g/dL — ABNORMAL LOW (ref 13.0–17.0)
MCH: 30.8 pg (ref 26.0–34.0)
MCHC: 33.9 g/dL (ref 30.0–36.0)
MCV: 90.8 fL (ref 78.0–100.0)
Platelets: 56 10*3/uL — ABNORMAL LOW (ref 150–400)
RBC: 3.25 MIL/uL — ABNORMAL LOW (ref 4.22–5.81)
RDW: 16.5 % — ABNORMAL HIGH (ref 11.5–15.5)
WBC: 4.3 10*3/uL (ref 4.0–10.5)

## 2014-09-30 LAB — BASIC METABOLIC PANEL
Anion gap: 8 (ref 5–15)
BUN: 28 mg/dL — ABNORMAL HIGH (ref 6–20)
CHLORIDE: 102 mmol/L (ref 101–111)
CO2: 25 mmol/L (ref 22–32)
CREATININE: 1.33 mg/dL — AB (ref 0.61–1.24)
Calcium: 7.9 mg/dL — ABNORMAL LOW (ref 8.9–10.3)
GFR calc Af Amer: >60 mL/min (ref >60)
GFR calc non Af Amer: 57 mL/min — ABNORMAL LOW (ref >60)
Glucose, Bld: 112 mg/dL — ABNORMAL HIGH (ref 70–99)
Potassium: 3.8 mmol/L (ref 3.5–5.1)
Sodium: 135 mmol/L (ref 135–145)

## 2014-09-30 LAB — GLUCOSE, CAPILLARY
GLUCOSE-CAPILLARY: 112 mg/dL — AB (ref 70–99)
Glucose-Capillary: 125 mg/dL — ABNORMAL HIGH (ref 70–99)
Glucose-Capillary: 131 mg/dL — ABNORMAL HIGH (ref 70–99)
Glucose-Capillary: 162 mg/dL — ABNORMAL HIGH (ref 70–99)
Glucose-Capillary: 169 mg/dL — ABNORMAL HIGH (ref 70–99)
Glucose-Capillary: 178 mg/dL — ABNORMAL HIGH (ref 70–99)

## 2014-09-30 NOTE — Care Management Note (Signed)
Case Management Note  Patient Details  Name: [name] MRN: [id number] Date of Birth: 23-Sep-1955  Subjective/Objective:                    Action/Plan:   09/30/2014   3pm After requesting HH from [facility], they are declining to provide services to this pt as they feel that he is unsafe in the home, [facility] has found him unresponsive several times. Suspect drug use in the home.  MD notifed. Will ask another agency,however will need to disclose this to anyother agency.     Expected Discharge Date:                  Expected Discharge Plan:  [place]  In-House Referral:     Discharge planning Services  NA  Post Acute Care Choice:  Home Health Choice offered to:  Patient  DME Arranged:    DME Agency:     HH Arranged:  RN, Disease Management [place] Agency:  [facility]  Status of Service:  Completed, signed off  Medicare Important Message Given:  Yes Date Medicare IM Given:  09/30/14 Medicare IM give by:  Jasmine Pang RN MPH, case manager, [phone] Date Additional Medicare IM Given:    Additional Medicare Important Message give by:     If discussed at [place] of Stay Meetings, dates discussed:    Additional Comments:  Adron Bene, RN 09/30/2014, 3:54 PM

## 2014-09-30 NOTE — Care Management Note (Signed)
Case Management Note  Patient Details  Name: [name] MRN: [id number] Date of Birth: 27-Oct-1955  Subjective/Objective:                    Action/Plan: Met with pt and offered [facility] for disease management , pt selected [facility]. [place] notified.   Expected Discharge Date:        10/02/2014          Expected Discharge Plan:  [place]  In-House Referral:     Discharge planning Services  NA  Post Acute Care Choice:  Home Health Choice offered to:  Patient  DME Arranged:    DME Agency:     HH Arranged:  RN, Disease Management [place] Agency:  [facility]  Status of Service:  Completed, signed off  Medicare Important Message Given:  Yes Date Medicare IM Given:  09/30/14 Medicare IM give by:  Jasmine Pang RN MPH, case manager, [phone] Date Additional Medicare IM Given:    Additional Medicare Important Message give by:     If discussed at [place] of Stay Meetings, dates discussed:    Additional Comments:  Adron Bene, RN 09/30/2014, 2:02 PM

## 2014-09-30 NOTE — Progress Notes (Signed)
[facility] PROGRESS NOTE  [name] OTL:[id number] DOB: 1956/01/22 DOA: 09/26/2014 PCP: Darrell Merino, MD  Assessment/Plan: Anasarca/worsening ascites/scrotal swelling -due to cirrhosis, hypoalbuminemia (1.7), diastolic CHF -improving -continue IV lasix 40mg  q12, negative >5L, weight down 12lbs, creatinine improving -s/p Paracentesis 5/7, 4.5L removed, no SBP -Rd consult appreciated -follow I/Os, weights closely -scrotal swelling starting to improve  AKi on CKD -baseline creatinine normal -due to third spacing/intravascular volume depletion, -at risk of hepatorenal syndrome, urine lytes not reliable since on lasix -continue IV lasix, creatinine continues to improve -monitor Bmet, urine output  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 Minimal nonbleeding esophageal varices -continue PPI, monitor Hb  Thrombocytopenia -Secondary to liver cirrhosis   Substance abuse -UDS + for benzos and cocaine in past -reported on narcotics per PCP for chronic pain  COPD -stable  Diabetes mellitus, type II -Hemoglobin A1c 5.9 (08/14/2014) -Continue insulin sliding scale CBG monitoring  Chronic Diastolic CHF -Echocardiogram 08/15/2014: EF 60-65%. Grade 1 diastolic dysfunction  -Patient does have lower extremity swelling which she states is chronic -diuresis as tolerated as above  Hx bipolar d/o  Ambulate, Pt eval  DC tele  Code Status: Full Code Family Communication: none at bedside Disposition Plan: home in 1-2days   Procedures:  Paracentesis 4.5L on 5/7  HPI/Subjective: Scrotal swelling improving, breathing good, swelling improving  Objective: Filed Vitals:   09/30/14 0904  BP: 124/67  Pulse: 95  Temp: 98.1 F (36.7 C)  Resp: 18     Intake/Output Summary (Last 24 hours) at 09/30/14 1139 Last data filed at 09/30/14 1134  Gross per 24 hour  Intake   1800 ml  Output   3101 ml  Net  -1301 ml   Filed Weights   09/27/14 2300 09/29/14 0644 09/30/14 0514  Weight: 113.309 kg (249 lb 12.8 oz) 112.175 kg (247 lb 4.8 oz) 110.814 kg (244 lb 4.8 oz)    Exam:   General:  AAox3, no asterixes  Cardiovascular: S1S2/RRR  Respiratory: diminished BS at bases  Abdomen: soft, obese, less distended, NT, BS present  Musculoskeletal: 1-2plus edema  Scrotal swelling improving  Data Reviewed: Basic Metabolic Panel:  Recent Labs Lab 09/26/14 1618 09/27/14 0528 09/28/14 0520 09/29/14 0551 09/30/14 0546  NA 126* 128* 131* 134* 135  K 4.1 3.5 3.8 3.7 3.8  CL 96* 99* 102 104 102  CO2 23 23 21* 23 25  GLUCOSE 134* 89 143* 120* 112*  BUN 39* 40* 38* 32* 28*  CREATININE 1.94* 1.77* 1.57* 1.40* 1.33*  CALCIUM 8.0* 7.5* 7.9* 8.0* 7.9*   Liver Function Tests:  Recent Labs Lab 09/26/14 1618 09/27/14 0528 09/28/14 0520  AST 67* 52* 53*  ALT 25 22 20   ALKPHOS 102 78 82  BILITOT 2.9* 3.1* 2.2*  PROT 5.8* 4.4* 4.6*  ALBUMIN 2.1* 1.7* 1.7*   No results for input(s): LIPASE, AMYLASE in the last 168 hours.  Recent Labs Lab 09/26/14 2049  AMMONIA 47*   CBC:  Recent Labs Lab 09/26/14 1618 09/27/14 0528 09/28/14 0520 09/29/14 0551 09/30/14 0546  WBC 7.4 4.6 4.7 4.4 4.3  HGB 11.9* 9.8* 10.1* 10.2* 10.0*  HCT 34.0* 29.0* 29.7* 30.3* 29.5*  MCV 88.5 90.1 89.5 89.4 90.8  PLT 82* 62* 59* 61* 56*   Cardiac Enzymes: No results for input(s): CKTOTAL, CKMB, CKMBINDEX, TROPONINI in the last 168  hours. BNP (last 3 results)  Recent Labs  08/14/14 1910  BNP 29.3    ProBNP (last 3 results)  Recent Labs  02/08/14 1830 02/10/14 1547  PROBNP 966.3* 805.5*    CBG:  Recent Labs Lab 09/29/14 1657 09/29/14 2037 09/30/14 0009 09/30/14 0405 09/30/14 0803  GLUCAP 189* 162* 131* 112* 125*    Recent  Results (from the past 240 hour(s))  Body fluid culture     Status: None (Preliminary result)   Collection Time: 09/27/14 10:14 AM  Result Value Ref Range Status   Specimen Description PERITONEAL FLUID  Final   Special Requests NONE  Final   Gram Stain   Final    FEW WBC PRESENT, PREDOMINANTLY MONONUCLEAR NO ORGANISMS SEEN Performed at [facility]    Culture   Final    NO GROWTH 1 DAY Performed at [facility]    Report Status PENDING  Incomplete     Studies: No results found.  Scheduled Meds: . feeding supplement (GLUCERNA SHAKE)  237 mL Oral BID BM  . folic acid  1 mg Oral Daily  . furosemide  40 mg Intravenous BID  . insulin aspart  0-9 Units Subcutaneous 6 times per day  . lactulose  20 g Oral BID  . multivitamin with minerals  1 tablet Oral Daily  . pantoprazole  40 mg Oral BID  . sodium chloride  3 mL Intravenous Q12H  . sodium chloride  3 mL Intravenous Q12H  . thiamine  100 mg Oral Daily   Continuous Infusions:  Antibiotics Given (last 72 hours)    None      Principal Problem:   Acute encephalopathy Active Problems:   Alcohol abuse   Diabetes mellitus   Acute kidney injury   Hx of bipolar disorder   Alcoholic cirrhosis of liver with ascites   Medically noncompliant    Time spent: 69min    [name]  [facility] Pager [phone]. If 7PM-7AM, please contact night-coverage at [email or web address], password [facility] 09/30/2014, 11:39 AM  LOS: 4 days

## 2014-10-01 LAB — COMPREHENSIVE METABOLIC PANEL
ALBUMIN: 1.9 g/dL — AB (ref 3.5–5.0)
ALT: 24 U/L (ref 17–63)
AST: 59 U/L — AB (ref 15–41)
Alkaline Phosphatase: 90 U/L (ref 38–126)
Anion gap: 9 (ref 5–15)
BILIRUBIN TOTAL: 3.6 mg/dL — AB (ref 0.3–1.2)
BUN: 25 mg/dL — ABNORMAL HIGH (ref 6–20)
CHLORIDE: 100 mmol/L — AB (ref 101–111)
CO2: 25 mmol/L (ref 22–32)
Calcium: 8 mg/dL — ABNORMAL LOW (ref 8.9–10.3)
Creatinine, Ser: 1.28 mg/dL — ABNORMAL HIGH (ref 0.61–1.24)
GFR calc Af Amer: >60 mL/min (ref >60)
GFR calc non Af Amer: >60 mL/min (ref >60)
Glucose, Bld: 180 mg/dL — ABNORMAL HIGH (ref 70–99)
POTASSIUM: 4.1 mmol/L (ref 3.5–5.1)
SODIUM: 134 mmol/L — AB (ref 135–145)
Total Protein: 5.5 g/dL — ABNORMAL LOW (ref 6.5–8.1)

## 2014-10-01 LAB — NA AND K (SODIUM & POTASSIUM), RAND UR
Potassium Urine: 30 mmol/L
Sodium, Ur: 59 mmol/L

## 2014-10-01 LAB — CBC
HCT: 32.7 % — ABNORMAL LOW (ref 39.0–52.0)
HEMOGLOBIN: 10.8 g/dL — AB (ref 13.0–17.0)
MCH: 30.1 pg (ref 26.0–34.0)
MCHC: 33 g/dL (ref 30.0–36.0)
MCV: 91.1 fL (ref 78.0–100.0)
Platelets: 61 10*3/uL — ABNORMAL LOW (ref 150–400)
RBC: 3.59 MIL/uL — ABNORMAL LOW (ref 4.22–5.81)
RDW: 16.5 % — ABNORMAL HIGH (ref 11.5–15.5)
WBC: 4.1 10*3/uL (ref 4.0–10.5)

## 2014-10-01 LAB — GLUCOSE, CAPILLARY
GLUCOSE-CAPILLARY: 108 mg/dL — AB (ref 70–99)
GLUCOSE-CAPILLARY: 118 mg/dL — AB (ref 70–99)
GLUCOSE-CAPILLARY: 184 mg/dL — AB (ref 70–99)
Glucose-Capillary: 135 mg/dL — ABNORMAL HIGH (ref 70–99)
Glucose-Capillary: 146 mg/dL — ABNORMAL HIGH (ref 70–99)
Glucose-Capillary: 148 mg/dL — ABNORMAL HIGH (ref 70–99)

## 2014-10-01 LAB — BODY FLUID CULTURE: CULTURE: NO GROWTH

## 2014-10-01 LAB — PROTIME-INR
INR: 1.55 — AB (ref 0.00–1.49)
PROTHROMBIN TIME: 18.7 s — AB (ref 11.6–15.2)

## 2014-10-01 MED ORDER — SPIRONOLACTONE 25 MG PO TABS
25.0000 mg | ORAL_TABLET | Freq: Every day | ORAL | Status: DC
  Administered 2014-10-01 – 2014-10-03 (×3): 25 mg via ORAL
  Filled 2014-10-01 (×3): qty 1

## 2014-10-01 NOTE — Progress Notes (Addendum)
[facility] PROGRESS NOTE  [name] WER:[id number] DOB: 1955-11-25 DOA: 09/26/2014 PCP: Barbette Merino, MD     Anasarca/worsening ascites/scrotal swelling -due to cirrhosis, hypoalbuminemia (1.7), diastolic CHF -continue IV lasix 40mg  q12, negative >6.6L, weight down 12lbs, creatinine improving -Add Aldactone 25 po daily as still overloaded -consider Urine na/k -s/p Paracentesis 5/7, 4.5L removed, no SBP -Rd consult appreciated -follow I/Os, weights closely -overall still very swollen and will need more inpatietn diuresis  AKi on CKD -baseline creatinine normal -due to third spacing/intravascular volume depletion, -at risk of hepatorenal syndrome, urine lytes not reliable since on lasix -continue IV lasix, creatinine continues to improve  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 Minimal nonbleeding esophageal varices -continue PPI, monitor Hb  Thrombocytopenia -Secondary to liver cirrhosis   Substance abuse -UDS + for benzos and cocaine in past -reported on narcotics per PCP for chronic pain  COPD -stable  Diabetes mellitus, type II -Hemoglobin A1c 5.9 (08/14/2014) -Continue insulin sliding scale CBG monitoring  Chronic Diastolic CHF -Echocardiogram 08/15/2014: EF 60-65%. Grade 1 diastolic dysfunction  -Patient does have lower extremity swelling which she states is chronic -diuresis as tolerated as above  Hx bipolar d/o  Ambulate, Pt eval  DC tele  Code Status: Full Code Family Communication: none at bedside Disposition Plan: no resources and cannot get to SNF-will need to re-apply for medicare when patient   Procedures:  Paracentesis 4.5L on  5/7  HPI/Subjective:  Eating Tearful Cannot get medicare No n/v/sob Scrotal swelling imporved    Objective: Filed Vitals:   10/01/14 0430  BP: 129/62  Pulse: 80  Temp: 98.3 F (36.8 C)  Resp: 16    Intake/Output Summary (Last 24 hours) at 10/01/14 1520 Last data filed at 10/01/14 0904  Gross per 24 hour  Intake    943 ml  Output   2101 ml  Net  -1158 ml   Filed Weights   09/29/14 0644 09/30/14 0514 09/30/14 2013  Weight: 112.175 kg (247 lb 4.8 oz) 110.814 kg (244 lb 4.8 oz) 112.492 kg (248 lb)    Exam:   General:  AAox3, no asterixes  Cardiovascular: S1S2/RRR  Respiratory: diminished BS at bases  Abdomen: soft, obese, less distended, NT, BS present  Musculoskeletal: 1-2plus edema  Anasarca -pittign edema up thighs  Data Reviewed: Basic Metabolic Panel:  Recent Labs Lab 09/27/14 0528 09/28/14 0520 09/29/14 0551 09/30/14 0546 10/01/14 0900  NA 128* 131* 134* 135 134*  K 3.5 3.8 3.7 3.8 4.1  CL 99* 102 104 102 100*  CO2 23 21* 23 25 25   GLUCOSE 89 143* 120* 112* 180*  BUN 40* 38* 32* 28* 25*  CREATININE 1.77* 1.57* 1.40* 1.33* 1.28*  CALCIUM 7.5* 7.9* 8.0* 7.9* 8.0*   Liver Function Tests:  Recent Labs Lab 09/26/14 1618 09/27/14 0528 09/28/14 0520 10/01/14 0900  AST 67* 52* 53* 59*  ALT 25 22 20 24   ALKPHOS 102 78 82 90  BILITOT 2.9* 3.1* 2.2* 3.6*  PROT 5.8* 4.4* 4.6* 5.5*  ALBUMIN 2.1* 1.7* 1.7* 1.9*   No results for input(s): LIPASE, AMYLASE in the last 168 hours.  Recent Labs Lab 09/26/14 2049  AMMONIA 47*   CBC:  Recent Labs Lab 09/27/14 0528 09/28/14 0520 09/29/14 0551 09/30/14 0546 10/01/14 0900  WBC 4.6 4.7  4.4 4.3 4.1  HGB 9.8* 10.1* 10.2* 10.0* 10.8*  HCT 29.0* 29.7* 30.3* 29.5* 32.7*  MCV 90.1 89.5 89.4 90.8 91.1  PLT 62* 59* 61* 56* 61*   Cardiac Enzymes: No results for input(s): CKTOTAL, CKMB, CKMBINDEX, TROPONINI in the last 168 hours. BNP (last 3 results)  Recent Labs  08/14/14 1910  BNP 29.3     ProBNP (last 3 results)  Recent Labs  02/08/14 1830 02/10/14 1547  PROBNP 966.3* 805.5*    CBG:  Recent Labs Lab 09/30/14 2009 10/01/14 0009 10/01/14 0434 10/01/14 0759 10/01/14 1159  GLUCAP 162* 135* 108* 118* 148*    Recent Results (from the past 240 hour(s))  Body fluid culture     Status: None   Collection Time: 09/27/14 10:14 AM  Result Value Ref Range Status   Specimen Description PERITONEAL FLUID  Final   Special Requests NONE  Final   Gram Stain   Final    FEW WBC PRESENT, PREDOMINANTLY MONONUCLEAR NO ORGANISMS SEEN Performed at [facility]    Culture   Final    NO GROWTH 3 DAYS Performed at [facility]    Report Status 10/01/2014 FINAL  Final     Studies: No results found.  Scheduled Meds: . feeding supplement (GLUCERNA SHAKE)  237 mL Oral BID BM  . folic acid  1 mg Oral Daily  . furosemide  40 mg Intravenous BID  . insulin aspart  0-9 Units Subcutaneous 6 times per day  . lactulose  20 g Oral BID  . multivitamin with minerals  1 tablet Oral Daily  . pantoprazole  40 mg Oral BID  . sodium chloride  3 mL Intravenous Q12H  . sodium chloride  3 mL Intravenous Q12H  . thiamine  100 mg Oral Daily   Continuous Infusions:  Antibiotics Given (last 72 hours)    None      Principal Problem:   Acute encephalopathy Active Problems:   Alcohol abuse   Diabetes mellitus   Acute kidney injury   Hx of bipolar disorder   Alcoholic cirrhosis of liver with ascites   Medically noncompliant    Time spent: 34min   Verneita Griffes, MD [facility] (P) [phone]

## 2014-10-01 NOTE — Evaluation (Signed)
Physical Therapy Evaluation Patient Details Name: [name] MRN: [id number] DOB: [birth year] Today's Date: 10/01/2014   History of Present Illness  59 yo male with h/o etoh cirrhosis of the liver, hepatic encephalopathy, noncompliance comes in with one month of worsening swelling in his testicles, abdomen and legs. Mildly confused. Says he has not drank any etoh in 3 years. Also has not been taking his medications at home at all. Came to the ED because he is unable to urinate. Denies sob or pain anywhere. No fevers.   Clinical Impression  Pt admitted with above diagnosis. Pt currently with functional limitations due to the deficits listed below (see PT Problem List). Pt ambulated with RW with unsteady gait at times.  Pt a questionable historian regarding who will be with him at home. Recommend SNF for therapy at d/c.  Pt will benefit from skilled PT to increase their independence and safety with mobility to allow discharge to the venue listed below.      Follow Up Recommendations SNF;Supervision/Assistance - 24 hour    Equipment Recommendations  Other (comment) (TBA)    Recommendations for Other Services       Precautions / Restrictions Precautions Precautions: Fall Restrictions Weight Bearing Restrictions: No      Mobility  Bed Mobility Overal bed mobility: Needs Assistance Bed Mobility: Supine to Sit     Supine to sit: Min guard     General bed mobility comments: Needed incr time to get to EOb.   Transfers Overall transfer level: Needs assistance   Transfers: Sit to/from Stand Sit to Stand: Min assist;From elevated surface         General transfer comment: Needed cues for hand placement.  needed assist for anterior lean to power up.   Ambulation/Gait Ambulation/Gait assistance: Min assist Ambulation Distance (Feet): 40 Feet Assistive device: Rolling walker (2 wheeled) Gait Pattern/deviations: Step-through pattern;Decreased stride length;Wide base of  support   Gait velocity interpretation: Below normal speed for age/gender General Gait Details: Ambulated 20 feet to sink and washed face and then 20 feet back to chair.  Pt generally unsteady at times due to shakiness in knees.  Needed min assist to steady.  Pt with very wide BOS.    Stairs            Wheelchair Mobility    Modified Rankin (Stroke Patients Only)       Balance Overall balance assessment: Needs assistance;History of Falls         Standing balance support: Bilateral upper extremity supported;During functional activity Standing balance-Leahy Scale: Poor Standing balance comment: Requires UE support for balance. Washed face at sink needing to reach for sink to steady self at times.  Wet the clothes and water was dripping on floor beneath him as he could not wring them out enough.  Pt appeared unaware of water on floor.  Decr safety awareness.               High level balance activites: Turns;Direction changes;Sudden stops High Level Balance Comments: Needed min assist with challenges.              Pertinent Vitals/Pain Pain Assessment: No/denies pain  VSS    Home Living Family/patient expects to be discharged to:: Private residence Living Arrangements: Alone Available Help at Discharge: Family;Available 24 hours/day (questionably has 24 hours, says brother may come, girlfriend may come back?) Type of Home: Apartment Home Access: Level entry     Home Layout: One level Home Equipment: Walker - 2  wheels;Bedside commode      Prior Function Level of Independence: Independent with assistive device(s);Needs assistance   Gait / Transfers Assistance Needed: Uses RW     Comments: Pt states that he has fallen 4 times in last several months but never with RW.       Hand Dominance   Dominant Hand: Right    Extremity/Trunk Assessment   Upper Extremity Assessment: Defer to OT evaluation           Lower Extremity Assessment: Generalized  weakness      Cervical / Trunk Assessment: Kyphotic  Communication   Communication: No difficulties  Cognition Arousal/Alertness: Awake/alert Behavior During Therapy: Flat affect Overall Cognitive Status: History of cognitive impairments - at baseline Area of Impairment: Safety/judgement;Problem solving         Safety/Judgement: Decreased awareness of safety   Problem Solving: Slow processing;Decreased initiation;Difficulty sequencing;Requires verbal cues General Comments: Generally poor safety awareness.    General Comments General comments (skin integrity, edema, etc.): Pt LEs and scrotum with incr edema.     Exercises        Assessment/Plan    PT Assessment Patient needs continued PT services  PT Diagnosis Generalized weakness   PT Problem List Decreased activity tolerance;Decreased balance;Decreased mobility;Decreased knowledge of use of DME;Decreased safety awareness;Decreased knowledge of precautions  PT Treatment Interventions DME instruction;Gait training;Functional mobility training;Therapeutic activities;Balance training;Therapeutic exercise;Patient/family education   PT Goals (Current goals can be found in the Care Plan section) Acute Rehab PT Goals Patient Stated Goal: to get better PT Goal Formulation: With patient Time For Goal Achievement: 10/15/14 Potential to Achieve Goals: Good    Frequency Min 2X/week   Barriers to discharge Decreased caregiver support (states brother and girlfriend may be able to help)      Co-evaluation               End of Session Equipment Utilized During Treatment: Gait belt Activity Tolerance: Patient limited by fatigue Patient left: in chair;with call bell/phone within reach Nurse Communication: Mobility status         Time: 0947-0962 PT Time Calculation (min) (ACUTE ONLY): 23 min   Charges:   PT Evaluation $Initial PT Evaluation Tier I: 1 Procedure PT Treatments $Gait Training: 8-22 mins   PT G  CodesDenice Paradise Oct 17, 2014, 2:30 PM [place] Vikki Gains,PT Acute Rehabilitation [phone] (pager)

## 2014-10-02 LAB — CBC WITH DIFFERENTIAL/PLATELET
BASOS PCT: 1 % (ref 0–1)
Basophils Absolute: 0 10*3/uL (ref 0.0–0.1)
EOS PCT: 15 % — AB (ref 0–5)
Eosinophils Absolute: 0.5 10*3/uL (ref 0.0–0.7)
HEMATOCRIT: 30.1 % — AB (ref 39.0–52.0)
Hemoglobin: 10.1 g/dL — ABNORMAL LOW (ref 13.0–17.0)
Lymphocytes Relative: 21 % (ref 12–46)
Lymphs Abs: 0.7 10*3/uL (ref 0.7–4.0)
MCH: 30.8 pg (ref 26.0–34.0)
MCHC: 33.6 g/dL (ref 30.0–36.0)
MCV: 91.8 fL (ref 78.0–100.0)
MONO ABS: 0.4 10*3/uL (ref 0.1–1.0)
Monocytes Relative: 11 % (ref 3–12)
Neutro Abs: 1.8 10*3/uL (ref 1.7–7.7)
Neutrophils Relative %: 52 % (ref 43–77)
Platelets: 50 10*3/uL — ABNORMAL LOW (ref 150–400)
RBC: 3.28 MIL/uL — ABNORMAL LOW (ref 4.22–5.81)
RDW: 16.4 % — AB (ref 11.5–15.5)
WBC: 3.4 10*3/uL — ABNORMAL LOW (ref 4.0–10.5)

## 2014-10-02 LAB — COMPREHENSIVE METABOLIC PANEL
ALT: 22 U/L (ref 17–63)
ANION GAP: 6 (ref 5–15)
AST: 54 U/L — AB (ref 15–41)
Albumin: 1.9 g/dL — ABNORMAL LOW (ref 3.5–5.0)
Alkaline Phosphatase: 87 U/L (ref 38–126)
BILIRUBIN TOTAL: 2.7 mg/dL — AB (ref 0.3–1.2)
BUN: 25 mg/dL — AB (ref 6–20)
CO2: 26 mmol/L (ref 22–32)
CREATININE: 1.36 mg/dL — AB (ref 0.61–1.24)
Calcium: 7.8 mg/dL — ABNORMAL LOW (ref 8.9–10.3)
Chloride: 103 mmol/L (ref 101–111)
GFR, EST NON AFRICAN AMERICAN: 56 mL/min — AB (ref >60)
Glucose, Bld: 114 mg/dL — ABNORMAL HIGH (ref 65–99)
Potassium: 4.1 mmol/L (ref 3.5–5.1)
Sodium: 135 mmol/L (ref 135–145)
Total Protein: 5.1 g/dL — ABNORMAL LOW (ref 6.5–8.1)

## 2014-10-02 LAB — GLUCOSE, CAPILLARY
GLUCOSE-CAPILLARY: 122 mg/dL — AB (ref 65–99)
GLUCOSE-CAPILLARY: 122 mg/dL — AB (ref 65–99)
Glucose-Capillary: 116 mg/dL — ABNORMAL HIGH (ref 65–99)
Glucose-Capillary: 110 mg/dL — ABNORMAL HIGH (ref 65–99)
Glucose-Capillary: 146 mg/dL — ABNORMAL HIGH (ref 65–99)
Glucose-Capillary: 164 mg/dL — ABNORMAL HIGH (ref 65–99)

## 2014-10-02 LAB — PROTIME-INR
INR: 1.59 — AB (ref 0.00–1.49)
Prothrombin Time: 19.1 seconds — ABNORMAL HIGH (ref 11.6–15.2)

## 2014-10-02 NOTE — Clinical Social Work Placement (Signed)
   CLINICAL SOCIAL WORK PLACEMENT  NOTE  Date:  10/02/2014  Patient Details  Name: [name] MRN: [id number] Date of Birth: November 25, 1955  Clinical Social Work is seeking post-discharge placement for this patient at the [place] level of care (*CSW will initial, date and re-position this form in  chart as items are completed):  Yes   Patient/family provided with [place] Work Department's list of facilities offering this level of care within the geographic area requested by the patient (or if unable, by the patient's family).  Yes   Patient/family informed of their freedom to choose among providers that offer the needed level of care, that participate in Medicare, Medicaid or managed care program needed by the patient, have an available bed and are willing to accept the patient.  Yes   Patient/family informed of [place]'s ownership interest in [facility] and [facility], as well as of the fact that they are under no obligation to receive care at these facilities.  PASRR submitted to EDS on       PASRR number received on       Existing PASRR number confirmed on 10/02/14     FL2 transmitted to all facilities in geographic area requested by pt/family on       FL2 transmitted to all facilities within larger geographic area on       Patient informed that his/her managed care company has contracts with or will negotiate with certain facilities, including the following:            Patient/family informed of bed offers received.  Patient chooses bed at       Physician recommends and patient chooses bed at      Patient to be transferred to   on  .  Patient to be transferred to facility by       Patient family notified on   of transfer.  Name of family member notified:        PHYSICIAN       Additional Comment:    _______________________________________________ Roanna Raider, LCSW 10/02/2014, 11:15 PM

## 2014-10-02 NOTE — Clinical Documentation Improvement (Signed)
  Documentation states "diabetes" and "neuropathy". Please clarify if there is a relationship between these conditions.  Possible Conditions --  Diabetic neuropathy  --  Other condition  --  Not able to determine  Thank you,  Ezekiel Ina ,RN Clinical Documentation Specialist:  [place] Information Management

## 2014-10-02 NOTE — Progress Notes (Signed)
CSW Intern J. McLeans's assessment reviewed.  I have reviewed note and agree with her assessment.

## 2014-10-02 NOTE — Clinical Social Work Placement (Signed)
   CLINICAL SOCIAL WORK PLACEMENT  NOTE  Date:  10/02/2014  Patient Details  Name: [name] MRN: [id number] Date of Birth: 06/27/1955  Clinical Social Work is seeking post-discharge placement for this patient at the [place] level of care (*CSW will initial, date and re-position this form in  chart as items are completed):  Yes   Patient/family provided with [place] Work Department's list of facilities offering this level of care within the geographic area requested by the patient (or if unable, by the patient's family).  Yes   Patient/family informed of their freedom to choose among providers that offer the needed level of care, that participate in Medicare, Medicaid or managed care program needed by the patient, have an available bed and are willing to accept the patient.  Yes   Patient/family informed of [place]'s ownership interest in [facility] and [facility], as well as of the fact that they are under no obligation to receive care at these facilities.  PASRR submitted to EDS on       PASRR number received on       Existing PASRR number confirmed on 10/02/14     FL2 transmitted to all facilities in geographic area requested by pt/family on       FL2 transmitted to all facilities within larger geographic area on       Patient informed that his/her managed care company has contracts with or will negotiate with certain facilities, including the following:            Patient/family informed of bed offers received.  Patient chooses bed at       Physician recommends and patient chooses bed at      Patient to be transferred to   on  .  Patient to be transferred to facility by       Patient family notified on   of transfer.  Name of family member notified:        PHYSICIAN       Additional Comment:    _______________________________________________ Roanna Raider, LCSW 10/02/2014, 11:15 PM

## 2014-10-02 NOTE — Progress Notes (Signed)
Utilization review completed. Henry Utsey, RN, BSN. 

## 2014-10-02 NOTE — Progress Notes (Signed)
[facility] PROGRESS NOTE  [name] GEX:[id number] DOB: 1955/12/29 DOA: 09/26/2014 PCP: Barbette Merino, MD     Anasarca/worsening ascites/scrotal swelling -due to cirrhosis, hypoalbuminemia (1.7), diastolic CHF -continue IV lasix 40mg  q12, negative >7.34L,unreliable,  -Add Aldactone 25 po daily as still overloaded -Urine Na 59/Urine K 30 -s/p Paracentesis 5/7, 4.5L removed, no SBP -As he has shifting dullness he will probably need another paracentesis which we will order for 5/13 -Rd consult appreciated -follow I/Os, weights have been unreliable -overall still very swollen==> will adjust diuretics in a.m. 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 Paulita Fujita and he will need follow-up with him -Drug screen from last month Cocaine +  Chronic anemia and thrombocytopenia -stable, Hb higher than baseline, likely hemoconcentration -recent EGD: Mild to moderate portal gastropathy. Minimal nonbleeding esophageal varices -continue PPI, monitor Hb  Substance abuse -UDS + for benzos and cocaine in past -reported on narcotics per PCP for chronic pain  COPD -stable  Diabetes mellitus, type II -Hemoglobin A1c 5.9 (08/14/2014) -Continue insulin sliding scale CBG monitoring  Chronic Diastolic CHF -Echocardiogram 08/15/2014: EF 60-65%. Grade 1 diastolic dysfunction  -Patient does have lower extremity swelling which she states is chronic -diuresis as tolerated as above  Hx bipolar d/o  Ambulate, Pt eval  DC tele  Code Status: Full Code Family Communication: none  at bedside Disposition Plan: no resources and cannot get to SNF-will need to re-apply for medicare when patient   Procedures:  Paracentesis 4.5L on 5/7  HPI/Subjective:  Doing fair. Less emotional. Eating diet. Relates that he feels less swollen and less tender in his feet Abdomen is still swollen Passing some stool No nausea no vomiting    Objective: Filed Vitals:   10/02/14 1645  BP: 123/56  Pulse: 92  Temp: 98.5 F (36.9 C)  Resp: 18    Intake/Output Summary (Last 24 hours) at 10/02/14 1734 Last data filed at 10/02/14 1644  Gross per 24 hour  Intake    880 ml  Output   2404 ml  Net  -1524 ml   Filed Weights   09/30/14 0514 09/30/14 2013 10/01/14 2100  Weight: 110.814 kg (244 lb 4.8 oz) 112.492 kg (248 lb) 111.358 kg (245 lb 8 oz)    Exam:   General:  AAox3, no asterixes  Cardiovascular: S1S2/RRR  Respiratory: diminished BS at bases  Abdomen: soft, obese, less distended, NT, BS present--shifting dullness is present, testes are grossly swollen  Anasarca -pittign edema up thighs  Data Reviewed: Basic Metabolic Panel:  Recent Labs Lab 09/28/14 0520 09/29/14 0551 09/30/14 0546 10/01/14 0900 10/02/14 0549  NA 131* 134* 135 134* 135  K 3.8 3.7 3.8 4.1 4.1  CL 102 104 102 100* 103  CO2 21* 23 25 25 26   GLUCOSE 143* 120* 112* 180* 114*  BUN 38* 32* 28* 25* 25*  CREATININE 1.57* 1.40* 1.33* 1.28* 1.36*  CALCIUM 7.9* 8.0* 7.9* 8.0* 7.8*   Liver Function Tests:  Recent Labs Lab 09/26/14 1618 09/27/14 0528 09/28/14 0520 10/01/14 0900 10/02/14 0549  AST 67* 52* 53* 59* 54*  ALT 25 22 20 24 22   ALKPHOS 102 78 82 90 87  BILITOT 2.9* 3.1* 2.2* 3.6* 2.7*  PROT 5.8* 4.4* 4.6* 5.5* 5.1*  ALBUMIN 2.1* 1.7* 1.7* 1.9* 1.9*   No results for input(s): LIPASE, AMYLASE in the last 168 hours.  Recent Labs Lab 09/26/14 2049  AMMONIA 47*   CBC:  Recent Labs Lab 09/28/14 0520 09/29/14 0551 09/30/14 0546 10/01/14 0900 10/02/14 0549  WBC 4.7  4.4 4.3 4.1 3.4*  NEUTROABS  --   --   --   --  1.8  HGB 10.1* 10.2* 10.0* 10.8* 10.1*  HCT 29.7* 30.3* 29.5* 32.7* 30.1*  MCV 89.5 89.4 90.8 91.1 91.8  PLT 59* 61* 56* 61* 50*   Cardiac Enzymes: No results for input(s): CKTOTAL, CKMB, CKMBINDEX, TROPONINI in the last 168 hours. BNP (last 3 results)  Recent Labs  08/14/14 1910  BNP 29.3    ProBNP (last 3 results)  Recent Labs  02/08/14 1830 02/10/14 1547  PROBNP 966.3* 805.5*    CBG:  Recent Labs Lab 10/02/14 0147 10/02/14 0404 10/02/14 0746 10/02/14 1133 10/02/14 1643  GLUCAP 122* 116* 110* 122* 146*    Recent Results (from the past 240 hour(s))  Body fluid culture     Status: None   Collection Time: 09/27/14 10:14 AM  Result Value Ref Range Status   Specimen Description PERITONEAL FLUID  Final   Special Requests NONE  Final   Gram Stain   Final    FEW WBC PRESENT, PREDOMINANTLY MONONUCLEAR NO ORGANISMS SEEN Performed at [facility]    Culture   Final    NO GROWTH 3 DAYS Performed at [facility]    Report Status 10/01/2014 FINAL  Final     Studies: No results found.  Scheduled Meds: . feeding supplement (GLUCERNA SHAKE)  237 mL Oral BID BM  . folic acid  1 mg Oral Daily  . furosemide  40 mg Intravenous BID  . insulin aspart  0-9 Units Subcutaneous 6 times per day  . lactulose  20 g Oral BID  . multivitamin with minerals  1 tablet Oral Daily  . pantoprazole  40 mg Oral BID  . sodium chloride  3 mL Intravenous Q12H  . sodium chloride  3 mL Intravenous Q12H  . spironolactone  25 mg Oral Daily  . thiamine  100 mg Oral Daily   Continuous Infusions:  Antibiotics Given (last 72 hours)    None      Principal Problem:   Acute encephalopathy Active Problems:   Alcohol abuse   Diabetes mellitus   Acute kidney injury   Hx of bipolar disorder   Alcoholic cirrhosis of liver with ascites   Medically noncompliant    Time spent: 49min   Verneita Griffes, MD [facility] [phone]

## 2014-10-02 NOTE — Clinical Social Work Note (Cosign Needed)
Clinical Social Work Assessment  Patient Details  Name: [name] MRN: [id number] Date of Birth: November 01, 1955  Date of referral:  10/02/14               Reason for consult:  Rule-out Psychosocial                Permission sought to share information with:  Other Permission granted to share information::  Yes, Verbal Permission Granted  Name::     Serita Butcher  Agency::     Relationship::  Significant Other  Contact Information:  [phone]  Housing/Transportation Living arrangements for the past 2 months:  Apartment Source of Information:  Patient Patient Interpreter Needed:  None Criminal Activity/Legal Involvement Pertinent to Current Situation/Hospitalization:  No - Comment as needed Significant Relationships:  Significant Other Lives with:  Self Do you feel safe going back to the place where you live?  No Need for family participation in patient care:  No (Coment)  Care giving concerns:  Patient and significant other understand that patient needs rehab before returning home.    Social Worker assessment / plan:  CSW Intern talked with patient and significant other , Serita Butcher, regarding discharge plans. Patient stated he was discharged to [facility] for rehab before and would like to retun there. CSW will send out FL-2, complete bed search, and provide patient with bed offers.    Employment status:  Disabled (Comment on whether or not currently receiving Disability) Insurance information:  Medicare, Medicaid In [place] PT Recommendations:  [place] / Referral to community resources:  [place]  Patient/Family's Response to care:  Patient alert and oriented x4 in bed watching television. Significant other, Serita Butcher at bedside providing emotional support. Patient engaged in [place] Intern assessment. Patient understanding of Social Work role and appreciative of support.   Patient/Family's Understanding of and Emotional Response  to Diagnosis, Current Treatment, and Prognosis:  Patient and significant other understanding and agreeable with discharge to SNF.   Emotional Assessment Appearance:  Appears stated age Attitude/Demeanor/Rapport:  Other (Appropriate) Affect (typically observed):  Accepting Orientation:  Oriented to Self, Oriented to Place, Oriented to  Time, Oriented to Situation Alcohol / Substance use:  Alcohol Use Psych involvement (Current and /or in the community):  No (Comment)  Discharge Needs  Concerns to be addressed:  No discharge needs identified Readmission within the last 30 days:  Yes Current discharge risk:  None Barriers to Discharge:  No Barriers Identified   Truitt Merle, Student-SW 10/02/2014, 10:46 PM

## 2014-10-02 NOTE — Clinical Documentation Improvement (Signed)
  There is conflicting documentation in the chart. Please clarify the likely/suspected diagnosis:   Possible Conditions --  Acute encephalopathy  --  Hepatic encephalopathy --  Other condition --  Not able to determine  Thank you,  Ezekiel Ina ,RN Clinical Documentation Specialist:  [place] Information Management

## 2014-10-03 ENCOUNTER — Inpatient Hospital Stay (HOSPITAL_COMMUNITY): Payer: Medicare PPO

## 2014-10-03 LAB — COMPREHENSIVE METABOLIC PANEL
ALK PHOS: 89 U/L (ref 38–126)
ALT: 23 U/L (ref 17–63)
AST: 56 U/L — ABNORMAL HIGH (ref 15–41)
Albumin: 1.9 g/dL — ABNORMAL LOW (ref 3.5–5.0)
Anion gap: 8 (ref 5–15)
BUN: 23 mg/dL — ABNORMAL HIGH (ref 6–20)
CHLORIDE: 99 mmol/L — AB (ref 101–111)
CO2: 26 mmol/L (ref 22–32)
Calcium: 7.8 mg/dL — ABNORMAL LOW (ref 8.9–10.3)
Creatinine, Ser: 1.34 mg/dL — ABNORMAL HIGH (ref 0.61–1.24)
GFR calc Af Amer: >60 mL/min (ref >60)
GFR calc non Af Amer: 57 mL/min — ABNORMAL LOW (ref >60)
GLUCOSE: 124 mg/dL — AB (ref 65–99)
Potassium: 3.9 mmol/L (ref 3.5–5.1)
Sodium: 133 mmol/L — ABNORMAL LOW (ref 135–145)
Total Bilirubin: 2.6 mg/dL — ABNORMAL HIGH (ref 0.3–1.2)
Total Protein: 5.2 g/dL — ABNORMAL LOW (ref 6.5–8.1)

## 2014-10-03 LAB — GLUCOSE, CAPILLARY
GLUCOSE-CAPILLARY: 119 mg/dL — AB (ref 65–99)
GLUCOSE-CAPILLARY: 125 mg/dL — AB (ref 65–99)
GLUCOSE-CAPILLARY: 138 mg/dL — AB (ref 65–99)
Glucose-Capillary: 129 mg/dL — ABNORMAL HIGH (ref 65–99)
Glucose-Capillary: 133 mg/dL — ABNORMAL HIGH (ref 65–99)
Glucose-Capillary: 165 mg/dL — ABNORMAL HIGH (ref 65–99)
Glucose-Capillary: 169 mg/dL — ABNORMAL HIGH (ref 65–99)

## 2014-10-03 LAB — PROTIME-INR
INR: 1.57 — ABNORMAL HIGH (ref 0.00–1.49)
PROTHROMBIN TIME: 18.9 s — AB (ref 11.6–15.2)

## 2014-10-03 MED ORDER — SPIRONOLACTONE 100 MG PO TABS
100.0000 mg | ORAL_TABLET | Freq: Every day | ORAL | Status: DC
  Administered 2014-10-04: 100 mg via ORAL
  Filled 2014-10-03: qty 1

## 2014-10-03 MED ORDER — ALBUMIN HUMAN 25 % IV SOLN
75.0000 g | Freq: Once | INTRAVENOUS | Status: AC
  Administered 2014-10-03: 75 g via INTRAVENOUS
  Filled 2014-10-03: qty 300

## 2014-10-03 MED ORDER — FUROSEMIDE 10 MG/ML IJ SOLN
40.0000 mg | Freq: Every day | INTRAMUSCULAR | Status: DC
  Administered 2014-10-04: 40 mg via INTRAVENOUS
  Filled 2014-10-03: qty 4

## 2014-10-03 MED ORDER — ALBUMIN HUMAN 25 % IV SOLN
50.0000 g | Freq: Once | INTRAVENOUS | Status: AC
  Administered 2014-10-04: 50 g via INTRAVENOUS
  Filled 2014-10-03 (×2): qty 200

## 2014-10-03 MED ORDER — LIDOCAINE HCL (PF) 1 % IJ SOLN
INTRAMUSCULAR | Status: AC
  Administered 2014-10-03: 11:00:00
  Filled 2014-10-03: qty 10

## 2014-10-03 NOTE — Progress Notes (Signed)
Physical Therapy Treatment Patient Details Name: [name] MRN: [id number] DOB: [birth year] Today's Date: 10/03/2014    History of Present Illness 59 yo male with h/o etoh cirrhosis of the liver, hepatic encephalopathy, noncompliance comes in with one month of worsening swelling in his testicles, abdomen and legs. Mildly confused. Says he has not drank any etoh in 3 years. Also has not been taking his medications at home at all. Came to the ED because he is unable to urinate. Denies sob or pain anywhere. No fevers.     PT Comments    Patient required exceptional amount of time to complete mobility and ambulation. Starts to move then stops. Frequent cues to stay on  Task. Patient is much improved with mobility but unsteady for ambulation. Recommend SNF.  Follow Up Recommendations  SNF;Supervision/Assistance - 24 hour     Equipment Recommendations       Recommendations for Other Services       Precautions / Restrictions Precautions Precautions: Fall    Mobility  Bed Mobility Overal bed mobility: Needs Assistance Bed Mobility: Supine to Sit     Supine to sit: Min guard;HOB elevated     General bed mobility comments: , use of rail  Transfers Overall transfer level: Needs assistance Equipment used: Rolling walker (2 wheeled) Transfers: Sit to/from Stand Sit to Stand: Min assist;From elevated surface         General transfer comment: extra time, wide base due to edema of legs , cues for hand placement.  Ambulation/Gait Ambulation/Gait assistance: Min assist;Mod assist Ambulation Distance (Feet): 150 Feet Assistive device: Rolling walker (2 wheeled) Gait Pattern/deviations: Wide base of support;Decreased stride length     General Gait Details: exceptional amount of time to prepare for getting up from sitting, patient in no hurry.   Stairs            Wheelchair Mobility    Modified Rankin (Stroke Patients Only)       Balance            Standing balance support: During functional activity;Bilateral upper extremity supported Standing balance-Leahy Scale: Poor Standing balance comment: requires  UE support, Didd pick up RW x 1 to  cross  the computer leg,patient given safety cues to not  try this again.                    Cognition Arousal/Alertness: Awake/alert   Overall Cognitive Status: History of cognitive impairments - at baseline               Problem Solving: Slow processing;Decreased initiation;Difficulty sequencing;Requires verbal cues      Exercises      General Comments        Pertinent Vitals/Pain Pain Assessment: 0-10 Pain Score: 8  Pain Location: abd Pain Descriptors / Indicators: Aching;Cramping Pain Intervention(s): Patient requesting pain meds-RN notified;Monitored during session    Home Living                      Prior Function            PT Goals (current goals can now be found in the care plan section) Progress towards PT goals: Progressing toward goals    Frequency  Min 2X/week    PT Plan Current plan remains appropriate    Co-evaluation             End of Session   Activity Tolerance: Patient tolerated treatment well Patient left: in chair;with call  bell/phone within reach     Time: 1536-1636 PT Time Calculation (min) (ACUTE ONLY): 60 min  Charges:  $Gait Training: 23-37 mins $Therapeutic Activity: 23-37 mins                    G Codes:      Claretha Cooper 10/03/2014, 4:37 PM Tresa Endo PT [phone]

## 2014-10-03 NOTE — Procedures (Signed)
Successful US guided paracentesis from LLQ.  Yielded 5.5 liters of serous fluid.  No immediate complications.  Pt tolerated well.   Specimen was not sent for labs.  Tsosie Billing D PA-C 10/03/2014 10:21 AM

## 2014-10-03 NOTE — Progress Notes (Signed)
[facility] PROGRESS NOTE  [name] UXN:[id number] DOB: [birth year] DOA: 09/26/2014 PCP: Barbette Merino, MD     Anasarca/worsening ascites/scrotal swelling -due to cirrhosis, hypoalbuminemia (1.7), diastolic CHF -continue IV lasix 40mg  q12, negative >7.34L,unreliable,  -Add Aldactone 25 po daily as still overloaded -Urine Na 59/Urine K 30 -s/p Paracentesis 5/7, 4.5L removed, no SBP -s/p rpt Para 5/13-5 liters out  -Rd consult appreciated -follow I/Os, weights have been unreliable -overall still very swollen==> change to Aldactone 100, Lasix 40 and monitor OP tomorrow 5/14  AKi on CKD -baseline creatinine normal -due to third spacing/intravascular volume depletion, -at risk of hepatorenal syndrome, urine lytes not reliable since on lasix  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 Paulita Fujita and he will need follow-up with him -Drug screen from last month Cocaine +  Chronic anemia and thrombocytopenia -stable, Hb higher than baseline, likely hemoconcentration -recent EGD: Mild to moderate portal gastropathy. Minimal nonbleeding esophageal varices -continue PPI, monitor Hb  Substance abuse -UDS + for benzos and cocaine in past -reported on narcotics per PCP for chronic pain  COPD -stable  Diabetes mellitus, type II -Hemoglobin A1c 5.9 (08/14/2014) -Continue insulin sliding scale CBG monitoring--cbgs 100 ranges  Chronic Diastolic CHF -Echocardiogram 08/15/2014: EF 60-65%. Grade 1 diastolic dysfunction  -Patient does have lower extremity swelling which she states is chronic -diuresis as tolerated as above  Hx bipolar d/o  Ambulate, Pt eval  DC tele  Code Status: Full Code Family Communication: none at bedside Disposition Plan: d/c to SNF 2-3 days if all stable   Procedures:  Paracentesis 4.5L on  5/7  HPI/Subjective:      Objective: Filed Vitals:   10/03/14 1659  BP: 128/88  Pulse: 97  Temp: 99.4 F (37.4 C)  Resp: 18    Intake/Output Summary (Last 24 hours) at 10/03/14 1820 Last data filed at 10/03/14 1800  Gross per 24 hour  Intake   1143 ml  Output   1775 ml  Net   -632 ml   Filed Weights   09/30/14 2013 10/01/14 2100 10/02/14 2029  Weight: 112.492 kg (248 lb) 111.358 kg (245 lb 8 oz) 109.5 kg (241 lb 6.5 oz)    Exam:   General:  AAox3, no asterixes  Cardiovascular: S1S2/RRR  Respiratory: diminished BS at bases  Abdomen: soft, obese, less distended, NT, BS present--shifting dullness is present, testes are grossly swollen  Anasarca -pittign edema up thighs  Data Reviewed: Basic Metabolic Panel:  Recent Labs Lab 09/29/14 0551 09/30/14 0546 10/01/14 0900 10/02/14 0549 10/03/14 0438  NA 134* 135 134* 135 133*  K 3.7 3.8 4.1 4.1 3.9  CL 104 102 100* 103 99*  CO2 23 25 25 26 26   GLUCOSE 120* 112* 180* 114* 124*  BUN 32* 28* 25* 25* 23*  CREATININE 1.40* 1.33* 1.28* 1.36* 1.34*  CALCIUM 8.0* 7.9* 8.0* 7.8* 7.8*   Liver Function Tests:  Recent Labs Lab 09/27/14 0528 09/28/14 0520 10/01/14 0900 10/02/14 0549 10/03/14 0438  AST 52* 53* 59* 54* 56*  ALT 22 20 24 22 23   ALKPHOS 78 82 90 87 89  BILITOT 3.1* 2.2* 3.6* 2.7* 2.6*  PROT 4.4* 4.6* 5.5* 5.1* 5.2*  ALBUMIN 1.7* 1.7* 1.9* 1.9* 1.9*   No results for input(s): LIPASE, AMYLASE in the last 168 hours.  Recent Labs Lab 09/26/14 2049  AMMONIA 47*   CBC:  Recent Labs Lab 09/28/14 0520  09/29/14 0551 09/30/14 0546 10/01/14 0900 10/02/14 0549  WBC 4.7 4.4 4.3 4.1 3.4*  NEUTROABS  --   --   --   --  1.8  HGB 10.1* 10.2* 10.0* 10.8* 10.1*  HCT 29.7* 30.3* 29.5* 32.7* 30.1*  MCV 89.5 89.4 90.8 91.1 91.8  PLT 59* 61* 56* 61* 50*   Cardiac Enzymes: No results for input(s): CKTOTAL, CKMB, CKMBINDEX, TROPONINI in the last 168 hours. BNP (last 3 results)  Recent Labs   08/14/14 1910  BNP 29.3    ProBNP (last 3 results)  Recent Labs  02/08/14 1830 02/10/14 1547  PROBNP 966.3* 805.5*    CBG:  Recent Labs Lab 10/03/14 0002 10/03/14 0412 10/03/14 0740 10/03/14 1127 10/03/14 1652  GLUCAP 138* 133* 125* 129* 119*    Recent Results (from the past 240 hour(s))  Body fluid culture     Status: None   Collection Time: 09/27/14 10:14 AM  Result Value Ref Range Status   Specimen Description PERITONEAL FLUID  Final   Special Requests NONE  Final   Gram Stain   Final    FEW WBC PRESENT, PREDOMINANTLY MONONUCLEAR NO ORGANISMS SEEN Performed at [facility]    Culture   Final    NO GROWTH 3 DAYS Performed at [facility]    Report Status 10/01/2014 FINAL  Final     Studies: US Paracentesis  10/03/2014   INDICATION: Cirrhosis, recurrent ascites and request for paracentesis.  EXAM: ULTRASOUND-GUIDED PARACENTESIS  COMPARISON:  US Paracentesis 09/27/14.  MEDICATIONS: None.  COMPLICATIONS: None immediate  TECHNIQUE: Informed written consent was obtained from the patient after a discussion of the risks, benefits and alternatives to treatment. A timeout was performed prior to the initiation of the procedure.  Initial ultrasound scanning demonstrates a large amount of ascites within the left lower abdominal quadrant. The left lower abdomen was prepped and draped in the usual sterile fashion. 1% lidocaine was used for local anesthesia.  An ultrasound image was saved for documentation purposed. A 6 Fr Safe-T-Centesis catheter was introduced. The paracentesis was performed. The catheter was removed and a dressing was applied. The patient tolerated the procedure well without immediate post procedural complication.  FINDINGS: A total of approximately 5.5 liters of serous fluid was removed.  IMPRESSION: Successful ultrasound-guided paracentesis yielding 5.5 liters of peritoneal fluid.  Read By:  Tsosie Billing PA-C   Electronically Signed   By: Marybelle Killings M.D.   On: 10/03/2014 12:26    Scheduled Meds: . [START ON 10/04/2014] albumin human  50 g Intravenous Once  . feeding supplement (GLUCERNA SHAKE)  237 mL Oral BID BM  . folic acid  1 mg Oral Daily  . furosemide  40 mg Intravenous BID  . insulin aspart  0-9 Units Subcutaneous 6 times per day  . lactulose  20 g Oral BID  . multivitamin with minerals  1 tablet Oral Daily  . pantoprazole  40 mg Oral BID  . sodium chloride  3 mL Intravenous Q12H  . sodium chloride  3 mL Intravenous Q12H  . spironolactone  25 mg Oral Daily  . thiamine  100 mg Oral Daily   Continuous Infusions:  Antibiotics Given (last 72 hours)    None      Principal Problem:   Acute encephalopathy Active Problems:   Alcohol abuse   Diabetes mellitus   Acute kidney injury   Hx of bipolar disorder   Alcoholic cirrhosis of liver with ascites   Medically noncompliant  Time spent: 22min   Verneita Griffes, MD [facility] [phone]

## 2014-10-03 NOTE — Clinical Social Work Placement (Signed)
   CLINICAL SOCIAL WORK PLACEMENT  NOTE  Date:  10/03/2014  Patient Details  Name: [name] MRN: [id number] Date of Birth: 1956/01/21  Clinical Social Work is seeking post-discharge placement for this patient at the [place] level of care (*CSW will initial, date and re-position this form in  chart as items are completed):  Yes   Patient/family provided with [place] Work Department's list of facilities offering this level of care within the geographic area requested by the patient (or if unable, by the patient's family).  Yes   Patient/family informed of their freedom to choose among providers that offer the needed level of care, that participate in Medicare, Medicaid or managed care program needed by the patient, have an available bed and are willing to accept the patient.  Yes   Patient/family informed of [facility]'s ownership interest in [facility] and [facility], as well as of the fact that they are under no obligation to receive care at these facilities.  PASRR submitted to EDS on  09/19/14     PASRR number received on  09/19/14     Existing PASRR number confirmed on 10/02/14     FL2 transmitted to all facilities in geographic area requested by pt/family on  10/02/14     FL2 transmitted to all facilities within larger geographic area on       Patient informed that his/her managed care company has contracts with or will negotiate with certain facilities, including the following:         10/03/14  Patient/family informed of bed offers received.  Patient chooses bed at  [facility] recommends and patient chooses bed at      Patient to be transferred to   on  .  Patient to be transferred to facility by       Patient family notified on   of transfer.  Name of family member notified:        PHYSICIAN       Additional Comment:  10/03/14:  Signed FL-2 original and copy placed in shadow  chart.  _______________________________________________ Sable Feil, LCSW 10/03/2014, 6:44 PM

## 2014-10-04 LAB — COMPREHENSIVE METABOLIC PANEL
ALBUMIN: 2.3 g/dL — AB (ref 3.5–5.0)
ALT: 18 U/L (ref 17–63)
AST: 43 U/L — ABNORMAL HIGH (ref 15–41)
Alkaline Phosphatase: 63 U/L (ref 38–126)
Anion gap: 5 (ref 5–15)
BUN: 23 mg/dL — ABNORMAL HIGH (ref 6–20)
CHLORIDE: 99 mmol/L — AB (ref 101–111)
CO2: 28 mmol/L (ref 22–32)
Calcium: 7.8 mg/dL — ABNORMAL LOW (ref 8.9–10.3)
Creatinine, Ser: 1.2 mg/dL (ref 0.61–1.24)
GFR calc Af Amer: >60 mL/min (ref >60)
GFR calc non Af Amer: >60 mL/min (ref >60)
Glucose, Bld: 113 mg/dL — ABNORMAL HIGH (ref 65–99)
POTASSIUM: 3.6 mmol/L (ref 3.5–5.1)
Sodium: 132 mmol/L — ABNORMAL LOW (ref 135–145)
TOTAL PROTEIN: 4.6 g/dL — AB (ref 6.5–8.1)
Total Bilirubin: 2.9 mg/dL — ABNORMAL HIGH (ref 0.3–1.2)

## 2014-10-04 LAB — GLUCOSE, CAPILLARY
GLUCOSE-CAPILLARY: 125 mg/dL — AB (ref 65–99)
GLUCOSE-CAPILLARY: 165 mg/dL — AB (ref 65–99)

## 2014-10-04 MED ORDER — SPIRONOLACTONE 100 MG PO TABS
100.0000 mg | ORAL_TABLET | Freq: Every day | ORAL | Status: DC
  Administered 2014-10-04 – 2014-10-05 (×2): 100 mg via ORAL
  Filled 2014-10-04 (×2): qty 1

## 2014-10-04 MED ORDER — FUROSEMIDE 40 MG PO TABS
40.0000 mg | ORAL_TABLET | Freq: Every day | ORAL | Status: DC
  Administered 2014-10-04 – 2014-10-05 (×2): 40 mg via ORAL
  Filled 2014-10-04 (×2): qty 1

## 2014-10-04 NOTE — Progress Notes (Signed)
[facility] PROGRESS NOTE  [name] POE:[id number] DOB: July 04, 1955 DOA: 09/26/2014 PCP: Barbette Merino, MD     Anasarca/worsening ascites/scrotal swelling -due to cirrhosis, hypoalbuminemia (1.7), diastolic CHF -continue IV lasix 40mg  q12, negative >7.34L,unreliable,  -Add Aldactone 25 po daily as still overloaded -Urine Na 59/Urine K 30 -s/p Paracentesis 5/7, 4.5L removed, no SBP -s/p rpt Para 5/13-5 liters out  -Rd consult appreciated -follow I/Os, weights have been unreliable -overall still very swollen==> change to Aldactone 100, Lasix 40  -likely will be close to d/c on 5/15 or 5/16  AKi on CKD -baseline creatinine normal -due to third spacing/intravascular volume depletion,  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 Paulita Fujita and he will need follow-up with him -Drug screen from last month Cocaine +  Chronic anemia and thrombocytopenia -stable, Hb higher than baseline, likely hemoconcentration -recent EGD: Mild to moderate portal gastropathy. Minimal nonbleeding esophageal varices -continue PPI, monitor Hb  Substance abuse -UDS + for benzos and cocaine in past -reported on narcotics per PCP for chronic pain  COPD -stable  Diabetes mellitus, type II -Hemoglobin A1c 5.9 (08/14/2014) -Continue insulin sliding scale CBG monitoring--cbgs 100 ranges  Chronic Diastolic CHF -Echocardiogram 08/15/2014: EF 60-65%. Grade 1 diastolic dysfunction  -Patient does have lower extremity swelling which she states is chronic -diuresis as tolerated as above  Hx bipolar d/o  Ambulate, Pt eval  DC tele  Code Status: Full Code Family Communication: none at bedside Disposition Plan: d/c to SNF 2-3 days if all stable   Procedures:  Paracentesis 4.5L on 5/7  HPI/Subjective:  Fair no specific issues tol diet no sob  cp Eating and drinking Aware to limit fluids    Objective: Filed Vitals:   10/04/14 0758  BP: 118/65  Pulse: 96  Temp: 98.6 F (37 C)  Resp: 17    Intake/Output Summary (Last 24 hours) at 10/04/14 1547 Last data filed at 10/04/14 1458  Gross per 24 hour  Intake   1206 ml  Output   1701 ml  Net   -495 ml   Filed Weights   10/01/14 2100 10/02/14 2029 10/03/14 1929  Weight: 111.358 kg (245 lb 8 oz) 109.5 kg (241 lb 6.5 oz) 103.874 kg (229 lb)    Exam:   General:  AAox3, no asterixes  Cardiovascular: S1S2/RRR  Respiratory: diminished BS at bases  Abdomen: soft, obese, less distended, NT, BS present--shifting dullness gone now, testes are less swollen  Anasarca -pittign edema up thighs  Data Reviewed: Basic Metabolic Panel:  Recent Labs Lab 09/30/14 0546 10/01/14 0900 10/02/14 0549 10/03/14 0438 10/04/14 0414  NA 135 134* 135 133* 132*  K 3.8 4.1 4.1 3.9 3.6  CL 102 100* 103 99* 99*  CO2 25 25 26 26 28   GLUCOSE 112* 180* 114* 124* 113*  BUN 28* 25* 25* 23* 23*  CREATININE 1.33* 1.28* 1.36* 1.34* 1.20  CALCIUM 7.9* 8.0* 7.8* 7.8* 7.8*   Liver Function Tests:  Recent Labs Lab 09/28/14 0520 10/01/14 0900 10/02/14 0549 10/03/14 0438 10/04/14 0414  AST 53* 59* 54* 56* 43*  ALT 20 24 22 23 18   ALKPHOS 82 90 87 89 63  BILITOT 2.2* 3.6* 2.7* 2.6* 2.9*  PROT 4.6* 5.5* 5.1* 5.2* 4.6*  ALBUMIN 1.7* 1.9* 1.9* 1.9* 2.3*   No results for input(s): LIPASE, AMYLASE in the last 168 hours. No results for input(s): AMMONIA in the last 168  hours. CBC:  Recent Labs Lab 09/28/14 0520 09/29/14 0551 09/30/14 0546 10/01/14 0900 10/02/14 0549  WBC 4.7 4.4 4.3 4.1 3.4*  NEUTROABS  --   --   --   --  1.8  HGB 10.1* 10.2* 10.0* 10.8* 10.1*  HCT 29.7* 30.3* 29.5* 32.7* 30.1*  MCV 89.5 89.4 90.8 91.1 91.8  PLT 59* 61* 56* 61* 50*   Cardiac Enzymes: No results for input(s): CKTOTAL, CKMB, CKMBINDEX, TROPONINI in the last 168 hours. BNP (last 3  results)  Recent Labs  08/14/14 1910  BNP 29.3    ProBNP (last 3 results)  Recent Labs  02/08/14 1830 02/10/14 1547  PROBNP 966.3* 805.5*    CBG:  Recent Labs Lab 10/03/14 1652 10/03/14 1950 10/03/14 2353 10/04/14 0353 10/04/14 0856  GLUCAP 119* 169* 165* 125* 165*    Recent Results (from the past 240 hour(s))  Body fluid culture     Status: None   Collection Time: 09/27/14 10:14 AM  Result Value Ref Range Status   Specimen Description PERITONEAL FLUID  Final   Special Requests NONE  Final   Gram Stain   Final    FEW WBC PRESENT, PREDOMINANTLY MONONUCLEAR NO ORGANISMS SEEN Performed at [facility]    Culture   Final    NO GROWTH 3 DAYS Performed at [facility]    Report Status 10/01/2014 FINAL  Final     Studies: US Paracentesis  10/03/2014   INDICATION: Cirrhosis, recurrent ascites and request for paracentesis.  EXAM: ULTRASOUND-GUIDED PARACENTESIS  COMPARISON:  US Paracentesis 09/27/14.  MEDICATIONS: None.  COMPLICATIONS: None immediate  TECHNIQUE: Informed written consent was obtained from the patient after a discussion of the risks, benefits and alternatives to treatment. A timeout was performed prior to the initiation of the procedure.  Initial ultrasound scanning demonstrates a large amount of ascites within the left lower abdominal quadrant. The left lower abdomen was prepped and draped in the usual sterile fashion. 1% lidocaine was used for local anesthesia.  An ultrasound image was saved for documentation purposed. A 6 Fr Safe-T-Centesis catheter was introduced. The paracentesis was performed. The catheter was removed and a dressing was applied. The patient tolerated the procedure well without immediate post procedural complication.  FINDINGS: A total of approximately 5.5 liters of serous fluid was removed.  IMPRESSION: Successful ultrasound-guided paracentesis yielding 5.5 liters of peritoneal fluid.  Read By:  Tsosie Billing PA-C    Electronically Signed   By: Marybelle Killings M.D.   On: 10/03/2014 12:26    Scheduled Meds: . feeding supplement (GLUCERNA SHAKE)  237 mL Oral BID BM  . folic acid  1 mg Oral Daily  . furosemide  40 mg Intravenous Daily  . lactulose  20 g Oral BID  . multivitamin with minerals  1 tablet Oral Daily  . pantoprazole  40 mg Oral BID  . sodium chloride  3 mL Intravenous Q12H  . sodium chloride  3 mL Intravenous Q12H  . spironolactone  100 mg Oral Daily  . thiamine  100 mg Oral Daily   Continuous Infusions:  Antibiotics Given (last 72 hours)    None      Principal Problem:   Acute encephalopathy Active Problems:   Alcohol abuse   Diabetes mellitus   Acute kidney injury   Hx of bipolar disorder   Alcoholic cirrhosis of liver with ascites   Medically noncompliant    Time spent: 35min   Verneita Griffes, MD [facility] [phone]

## 2014-10-04 NOTE — Clinical Social Work Note (Signed)
Patient has chosen [facility] (per unit CSW report).  Per MD documentation projected discharge is 2-3 days (5/16-5/17).  Weekend discharge is available if patient stabilizes.  Nonnie Done, LCSW [phone] CSW weekend coverage

## 2014-10-05 ENCOUNTER — Other Ambulatory Visit: Payer: Self-pay | Admitting: Family Medicine

## 2014-10-05 DIAGNOSIS — R188 Other ascites: Secondary | ICD-10-CM

## 2014-10-05 MED ORDER — THIAMINE HCL 100 MG PO TABS: 100.0000 mg | ORAL_TABLET | Freq: Every day | ORAL | Status: DC

## 2014-10-05 MED ORDER — FUROSEMIDE 80 MG PO TABS
80.0000 mg | ORAL_TABLET | Freq: Every day | ORAL | Status: DC
  Administered 2014-10-06 – 2014-10-07 (×2): 80 mg via ORAL
  Filled 2014-10-05 (×2): qty 1

## 2014-10-05 MED ORDER — AMITRIPTYLINE HCL 25 MG PO TABS: 25.0000 mg | ORAL_TABLET | Freq: Every day | ORAL | Status: DC

## 2014-10-05 MED ORDER — SPIRONOLACTONE 100 MG PO TABS: 100.0000 mg | ORAL_TABLET | Freq: Every day | ORAL | Status: DC

## 2014-10-05 MED ORDER — CLONAZEPAM 0.5 MG PO TABS: 0.2500 mg | ORAL_TABLET | Freq: Two times a day (BID) | ORAL | Status: DC

## 2014-10-05 MED ORDER — SPIRONOLACTONE 100 MG PO TABS
200.0000 mg | ORAL_TABLET | Freq: Every day | ORAL | Status: DC
  Administered 2014-10-06 – 2014-10-07 (×2): 200 mg via ORAL
  Filled 2014-10-05 (×2): qty 2

## 2014-10-05 MED ORDER — FOLIC ACID 1 MG PO TABS: 1.0000 mg | ORAL_TABLET | Freq: Every day | ORAL | Status: AC

## 2014-10-05 MED ORDER — GLUCERNA SHAKE PO LIQD: 237.0000 mL | Freq: Two times a day (BID) | ORAL | Status: DC

## 2014-10-05 NOTE — Progress Notes (Signed)
[facility] PROGRESS NOTE  KOY LAMP XFG:[id number] DOB: 06/04/1955 DOA: 09/26/2014 PCP: Barbette Merino, MD   59 year old male known EtOH, Encephalopathy, noncompliance, admitted this 09/26/14 with anasarca.  he was admitted and aggressively diuresed with Lasix initially and then paracentesis to X2 this admission. He'll be discharging to a nursing facility  Anasarca/worsening ascites/scrotal swelling -due to cirrhosis, hypoalbuminemia (1.7), diastolic CHF - negative >4 L  -Add Aldactone 25 po daily as still overloaded -Urine Na 59/Urine K 30 -s/p Paracentesis 5/7, 4.5L removed, no SBP -s/p rpt Para 5/13-5 liters out  -Rd consult appreciated -follow I/Os, weights have been unreliable -overall still very swollen==> change to Aldactone 100, Lasix 40---increased dosing to 200/80 -Will obtain dietitian inputto help patient understand fluid restriction. -he has a urostomy in am not sure why he has 1. 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 Paulita Fujita and he will need follow-up with him -Drug screen from last month Cocaine +  Chronic anemia and thrombocytopenia -stable, Hb higher than baseline, likely hemoconcentration -recent EGD: Mild to moderate portal gastropathy. Minimal nonbleeding esophageal varices -continue PPI, monitor Hb  Substance abuse -UDS + for benzos and cocaine in past -reported on narcotics per PCP for chronic pain  COPD -stable  Diabetes mellitus, type II -Hemoglobin A1c 5.9 (08/14/2014) -Continue insulin sliding scale CBG monitoring--cbgs 100 ranges  Chronic Diastolic CHF -Echocardiogram 08/15/2014: EF 60-65%. Grade 1 diastolic dysfunction  -Patient does have lower extremity swelling which she  states is chronic -diuresis as tolerated as above  Hx bipolar d/o  Ambulate, Pt eval  DC tele  Code Status: Full Code Family Communication: none at bedside Disposition Plan: d/c to SNF 2-3 days if all stable   Procedures:  Paracentesis 4.5L on 5/7  HPI/Subjective:   doing great. No other issues Tolerating diet Still swollen Testicular swelling is significantly improved No chest pain no shortness of breath No nausea no vomiting No fever no chills   Objective: Filed Vitals:   10/05/14 0900  BP: 111/53  Pulse: 80  Temp: 97.7 F (36.5 C)  Resp: 18    Intake/Output Summary (Last 24 hours) at 10/05/14 1133 Last data filed at 10/05/14 0900  Gross per 24 hour  Intake    922 ml  Output   1550 ml  Net   -628 ml   Filed Weights   10/02/14 2029 10/03/14 1929 10/04/14 2138  Weight: 109.5 kg (241 lb 6.5 oz) 103.874 kg (229 lb) 104.327 kg (230 lb)    Exam:   General:  AAox3, no asterixes  Cardiovascular: S1S2/RRR  Respiratory: diminished BS at bases  Abdomen: soft, obese, less distended, NT, BS present--shifting dullness gone now, testes are less swollen  Anasarca -pittign edema Is decreased  Data Reviewed: Basic Metabolic Panel:  Recent Labs Lab 09/30/14 0546 10/01/14 0900 10/02/14 0549 10/03/14 0438 10/04/14 0414  NA 135 134* 135 133* 132*  K 3.8 4.1 4.1 3.9 3.6  CL 102 100* 103 99* 99*  CO2 25 25 26 26 28   GLUCOSE 112* 180* 114* 124* 113*  BUN 28* 25* 25* 23* 23*  CREATININE 1.33* 1.28* 1.36* 1.34* 1.20  CALCIUM 7.9* 8.0* 7.8* 7.8* 7.8*   Liver Function Tests:  Recent Labs Lab 10/01/14 0900 10/02/14 0549 10/03/14 0438 10/04/14 0414  AST 59* 54* 56* 43*  ALT 24 22 23 18   ALKPHOS 90 87 89 63  BILITOT 3.6* 2.7* 2.6* 2.9*  PROT 5.5* 5.1* 5.2* 4.6*  ALBUMIN 1.9* 1.9* 1.9* 2.3*   No results for input(s): LIPASE, AMYLASE in the last 168 hours. No results for input(s): AMMONIA in the last 168 hours. CBC:  Recent Labs Lab 09/29/14 0551  09/30/14 0546 10/01/14 0900 10/02/14 0549  WBC 4.4 4.3 4.1 3.4*  NEUTROABS  --   --   --  1.8  HGB 10.2* 10.0* 10.8* 10.1*  HCT 30.3* 29.5* 32.7* 30.1*  MCV 89.4 90.8 91.1 91.8  PLT 61* 56* 61* 50*   Cardiac Enzymes: No results for input(s): CKTOTAL, CKMB, CKMBINDEX, TROPONINI in the last 168 hours. BNP (last 3 results)  Recent Labs  08/14/14 1910  BNP 29.3    ProBNP (last 3 results)  Recent Labs  02/08/14 1830 02/10/14 1547  PROBNP 966.3* 805.5*    CBG:  Recent Labs Lab 10/03/14 1652 10/03/14 1950 10/03/14 2353 10/04/14 0353 10/04/14 0856  GLUCAP 119* 169* 165* 125* 165*    Recent Results (from the past 240 hour(s))  Body fluid culture     Status: None   Collection Time: 09/27/14 10:14 AM  Result Value Ref Range Status   Specimen Description PERITONEAL FLUID  Final   Special Requests NONE  Final   Gram Stain   Final    FEW WBC PRESENT, PREDOMINANTLY MONONUCLEAR NO ORGANISMS SEEN Performed at [facility]    Culture   Final    NO GROWTH 3 DAYS Performed at [facility]    Report Status 10/01/2014 FINAL  Final     Studies: No results found.  Scheduled Meds: . feeding supplement (GLUCERNA SHAKE)  237 mL Oral BID BM  . folic acid  1 mg Oral Daily  . furosemide  40 mg Oral Daily  . lactulose  20 g Oral BID  . multivitamin with minerals  1 tablet Oral Daily  . pantoprazole  40 mg Oral BID  . sodium chloride  3 mL Intravenous Q12H  . sodium chloride  3 mL Intravenous Q12H  . spironolactone  100 mg Oral Daily  . thiamine  100 mg Oral Daily   Continuous Infusions:  Antibiotics Given (last 72 hours)    None      Principal Problem:   Acute encephalopathy Active Problems:   Alcohol abuse   Diabetes mellitus   Acute kidney injury   Hx of bipolar disorder   Alcoholic cirrhosis of liver with ascites   Medically noncompliant    Time spent: 11min   Verneita Griffes, MD [facility] [phone]

## 2014-10-05 NOTE — Progress Notes (Addendum)
CSW received call stating that patient is ready for DC.  CSW called [facility] where patient had chosen- to go for rehab- [facility] does not have a male bed available today.  CSW attempted to call patients other two facilities that offered beds- left messages.  Patient has [facility] Medicare which requires prior auth- offices are closed on the weekend  At this time there are no bed offers for this patient.  CSW called [facility] (admission coordinator our- no back up on call), [facility] (does not accept LOG), [facility] [place] was unable to speak with any one   CSW will continue to follow.  Domenica Reamer, [place] Social Worker [phone]

## 2014-10-06 ENCOUNTER — Inpatient Hospital Stay (HOSPITAL_COMMUNITY): Payer: Medicare PPO

## 2014-10-06 LAB — CBC WITH DIFFERENTIAL/PLATELET
BASOS PCT: 0 % (ref 0–1)
Basophils Absolute: 0 10*3/uL (ref 0.0–0.1)
EOS PCT: 15 % — AB (ref 0–5)
Eosinophils Absolute: 0.5 10*3/uL (ref 0.0–0.7)
HCT: 27.1 % — ABNORMAL LOW (ref 39.0–52.0)
Hemoglobin: 9.1 g/dL — ABNORMAL LOW (ref 13.0–17.0)
LYMPHS PCT: 22 % (ref 12–46)
Lymphs Abs: 0.8 10*3/uL (ref 0.7–4.0)
MCH: 31.3 pg (ref 26.0–34.0)
MCHC: 33.6 g/dL (ref 30.0–36.0)
MCV: 93.1 fL (ref 78.0–100.0)
Monocytes Absolute: 0.3 10*3/uL (ref 0.1–1.0)
Monocytes Relative: 9 % (ref 3–12)
Neutro Abs: 1.9 10*3/uL (ref 1.7–7.7)
Neutrophils Relative %: 54 % (ref 43–77)
PLATELETS: 33 10*3/uL — AB (ref 150–400)
RBC: 2.91 MIL/uL — ABNORMAL LOW (ref 4.22–5.81)
RDW: 16.7 % — ABNORMAL HIGH (ref 11.5–15.5)
WBC: 3.5 10*3/uL — AB (ref 4.0–10.5)

## 2014-10-06 LAB — RENAL FUNCTION PANEL
ALBUMIN: 2.5 g/dL — AB (ref 3.5–5.0)
Anion gap: 4 — ABNORMAL LOW (ref 5–15)
BUN: 16 mg/dL (ref 6–20)
CALCIUM: 8.2 mg/dL — AB (ref 8.9–10.3)
CO2: 29 mmol/L (ref 22–32)
Chloride: 103 mmol/L (ref 101–111)
Creatinine, Ser: 0.94 mg/dL (ref 0.61–1.24)
GFR calc Af Amer: >60 mL/min (ref >60)
GFR calc non Af Amer: >60 mL/min (ref >60)
GLUCOSE: 141 mg/dL — AB (ref 65–99)
Phosphorus: 2.8 mg/dL (ref 2.5–4.6)
Potassium: 3.6 mmol/L (ref 3.5–5.1)
SODIUM: 136 mmol/L (ref 135–145)

## 2014-10-06 MED ORDER — LIDOCAINE HCL (PF) 1 % IJ SOLN
INTRAMUSCULAR | Status: AC
  Filled 2014-10-06: qty 10

## 2014-10-06 MED ORDER — SPIRONOLACTONE 100 MG PO TABS: 200.0000 mg | ORAL_TABLET | Freq: Every day | ORAL | Status: DC

## 2014-10-06 MED ORDER — CLONAZEPAM 0.5 MG PO TABS: 0.2500 mg | ORAL_TABLET | Freq: Two times a day (BID) | ORAL | Status: DC

## 2014-10-06 MED ORDER — FUROSEMIDE 80 MG PO TABS: 80.0000 mg | ORAL_TABLET | Freq: Every day | ORAL | Status: DC

## 2014-10-06 MED ORDER — ALBUMIN HUMAN 25 % IV SOLN
50.0000 g | Freq: Once | INTRAVENOUS | Status: AC
  Administered 2014-10-06: 50 g via INTRAVENOUS
  Filled 2014-10-06: qty 200

## 2014-10-06 MED ORDER — GLUCERNA SHAKE PO LIQD: 237.0000 mL | Freq: Two times a day (BID) | ORAL | Status: DC

## 2014-10-06 NOTE — Progress Notes (Signed)
PT Cancellation Note  Patient Details Name: [name] MRN: [id number] DOB: 1955-08-05   Cancelled Treatment:    Reason Eval/Treat Not Completed: Patient at procedure or test/unavailable   Will follow up later today as time allows;  Otherwise, will follow up for PT tomorrow;   Thank you,  Roney Marion, PT  Acute Rehabilitation Services Pager [phone] Office [phone]     Roney Marion [facility] 10/06/2014, 11:21 AM

## 2014-10-06 NOTE — Procedures (Signed)
Successful US guided paracentesis from LLQ.  Yielded 4.5 liters of serous fluid.  No immediate complications.  Pt tolerated well.  Post procedure IV Albumin to be ordered by primary  Specimen was not sent for labs.  Tsosie Billing D PA-C 10/06/2014 11:22 AM

## 2014-10-06 NOTE — Progress Notes (Signed)
Nutrition Follow-up  DOCUMENTATION CODES:  Obesity unspecified  INTERVENTION: Pt to be discharged today.  Low sodium diet education given.  NUTRITION DIAGNOSIS:  Increased nutrient needs related to chronic illness as evidenced by estimated needs; ongoing  GOAL:  Patient will meet greater than or equal to 90% of their needs; met  MONITOR:  PO intake, Labs, Weight trends, I & O's, Skin  REASON FOR ASSESSMENT:  Consult Diet education  ASSESSMENT: Pt with h/o etoh cirrhosis of the liver, hepatic encephalopathy, noncompliance comes in with one month of worsening swelling in his testicles, abdomen and legs. Mildly confused.  Meal completion has been 80-100%. Appetite has been good. Pt has been consuming his Glucerna Shakes at times. RD consulted for diet education. Low sodium education/fluid restriction was given as well as a review of a diabetic diet. Plans for pt to be discharged today.   Height:  Ht Readings from Last 1 Encounters:  09/26/14 5' 11"  (1.803 m)    Weight:  Wt Readings from Last 1 Encounters:  10/06/14 199 lb 12.8 oz (90.629 kg)    Ideal Body Weight:  78 kg  Wt Readings from Last 10 Encounters:  10/06/14 199 lb 12.8 oz (90.629 kg)  09/03/14 225 lb 12 oz (102.4 kg)  08/17/14 211 lb (95.709 kg)  07/31/14 220 lb 4.8 oz (99.927 kg)  05/26/14 211 lb (95.709 kg)  03/30/14 215 lb (97.523 kg)  02/14/14 230 lb (104.327 kg)  02/12/14 230 lb (104.327 kg)  02/08/14 230 lb (104.327 kg)  02/05/14 206 lb (93.441 kg)    BMI:  Body mass index is 27.88 kg/(m^2).  Estimated Nutritional Needs:  Kcal:  2000-2200  Protein:  110-130  Fluid:  1.5 L/day  Skin:  Wound (see comment) (Stage II pressure ulcer on buttocks, stage I on sacrum, +4 LE edema)  Diet Order:  Diet - low sodium heart healthy Diet NPO time specified Diet - low sodium heart healthy  EDUCATION NEEDS:  Education needs no appropriate at this time   Intake/Output Summary (Last 24 hours)  at 10/06/14 1300 Last data filed at 10/06/14 1241  Gross per 24 hour  Intake    360 ml  Output   1477 ml  Net  -1117 ml    Last BM:  5/15  Kallie Locks, MS, RD, LDN Pager # [phone] After hours/ weekend pager # [phone]

## 2014-10-06 NOTE — Care Management Note (Signed)
Case Management Note  Patient Details  Name: [name] MRN: [id number] Date of Birth: 1955/11/18  Subjective/Objective:                 CM following for progression and d/c planning.   Action/Plan: Plan was for home with [facility] however pt condition requires rehab , therefore has agreed to short term SNF and plan is to d/c today, 10/06/2014.   Expected Discharge Date:        10/06/2014          Expected Discharge Plan:  [place]  In-House Referral:     Discharge planning Services  NA  Post Acute Care Choice:  NA Choice offered to:  Patient, NA  DME Arranged:    DME Agency:     HH Arranged:    [place] Agency:    Status of Service:  Completed, signed off  Medicare Important Message Given:  Yes Date Medicare IM Given:  09/30/14 Medicare IM give by:  Jasmine Pang RN MPH, case manager, [phone] Date Additional Medicare IM Given:  10/06/14 Additional Medicare Important Message give by:  Jasmine Pang RN MPH  If discussed at Long Length of Stay Meetings, dates discussed:    Additional Comments:  Adron Bene, RN 10/06/2014, 11:47 AM

## 2014-10-06 NOTE — Plan of Care (Signed)
Problem: Food- and Nutrition-Related Knowledge Deficit (NB-1.1) Goal: Nutrition education Formal process to instruct or train a patient/client in a skill or to impart knowledge to help patients/clients voluntarily manage or modify food choices and eating behavior to maintain or improve health. Outcome: Completed/Met Date Met:  10/06/14 Nutrition Education Note  RD consulted for nutrition education.  RD provided "Low Sodium Nutrition Therapy" handout from the Academy of [employer]. Reviewed patient's dietary recall. Provided examples on ways to decrease sodium intake in diet. Discouraged intake of processed foods and use of salt shaker. Encouraged fresh fruits and vegetables as well as whole grain sources of carbohydrates to maximize fiber intake.   RD discussed why it is important for patient to adhere to diet recommendations, and emphasized the role of fluids, foods to avoid, and fluid restrictions. RD additionally reviewed a diabetic diet.Teach back method used.  Expect good compliance.  Kallie Locks, MS, RD, LDN Pager # [phone] After hours/ weekend pager # [phone]

## 2014-10-06 NOTE — Discharge Summary (Addendum)
patient seen examined as addendum-stable for d/c to [facility] For consideration rpt Paracentsis 5/18 am at [facility]   Physician Discharge Summary  [name] UMP:[id number] DOB: 04/10/1956 DOA: 09/26/2014  PCP: Barbette Merino, MD  Admit date: 09/26/2014 Discharge date: 10/06/2014  Time spent: 35 minutes  Recommendations for Outpatient Follow-up:  1. Needs paracentesis 10/08/14 at 1 PM-please transport to [facility] outpatient to ge tthis done 2. Will copy Dr. Paulita Fujita on this D/c summary-he will need OP follow up-kindly arrange this 3. Uptritrate diuretic in 3-4 days if still swollen-would not do this earlier as 1/2 life aldactone 3 days and takes time to act 4. Scale back lactulose if copious diarrhea-goal is to have 1-2 loose stools a day 5. Get CMET, INR,CBC 1 week   Discharge Diagnoses:  Principal Problem:   Acute encephalopathy Active Problems:   Alcohol abuse   Diabetes mellitus   Acute kidney injury   Hx of bipolar disorder   Alcoholic cirrhosis of liver with ascites   Medically noncompliant   Discharge Condition: fair-guarded if continued ETOH  Diet recommendation: Patient to be d/c to SNF on 2 gram per day salt diet, and 1200 cc fluid restriction  [facility] Weights   10/03/14 1929 10/04/14 2138 10/06/14 0546  Weight: 103.874 kg (229 lb) 104.327 kg (230 lb) 90.629 kg (199 lb 12.8 oz)    History of present illness:   59 year old male known EtOH, Encephalopathy, noncompliance, admitted this 09/26/14 with anasarca. he was admitted and aggressively diuresed with Lasix initially and then paracentesis to X3 this admission. He'll be discharging to a nursing facility  Anasarca/worsening ascites/scrotal swelling -due to cirrhosis, hypoalbuminemia (1.7), diastolic CHF -was on IV lasix 40mg  q12, negative output unreliable  --Urine Na 59/Urine K 30 -s/p Paracentesis 5/7, 4.5L removed, no SBP -s/p rpt Para 5/13-5 liters out S/p Para 5/16-4.7 liters out  -Rd consult appreciated -follow  I/Os, weights have been unreliable -overall still very swollen==> d/c doses of aldactone 200/Lasix 80  AKi on CKD -baseline creatinine normal -due to third spacing/intravascular volume depletion  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 Paulita Fujita and he will need follow-up with him -Drug screen from last month Cocaine +  Chronic anemia and thrombocytopenia -stable, Hb higher than baseline, likely hemoconcentration -recent EGD: Mild to moderate portal gastropathy. Minimal nonbleeding esophageal varices -continue PPI, monitor Hb  Substance abuse -UDS + for benzos and cocaine in past -reported on narcotics per PCP for chronic pain  COPD -stable  Diabetes mellitus, type II -Hemoglobin A1c 5.9 (08/14/2014) -Continue insulin sliding scale CBG monitoring--cbgs 100 ranges -no insulin on d/c  Chronic Diastolic CHF -Echocardiogram 08/15/2014: EF 60-65%. Grade 1 diastolic dysfunction  -Patient does have lower extremity swelling which she states is chronic -diuresis as tolerated as above  Hx bipolar d/o    Discharge Exam: Filed Vitals:   10/06/14 1100  BP: 129/58  Pulse:   Temp:   Resp:     General: alert pleasant in no pain Cardiovascular: s1 s 2no m/r/g Respiratory: clear  Discharge Instructions   Discharge Instructions    Diet - low sodium heart healthy    Complete by:  As directed      Increase activity slowly    Complete by:  As directed           Current Discharge Medication List    START taking these medications   Details  !!  feeding supplement, GLUCERNA SHAKE, (GLUCERNA SHAKE) LIQD Take 237 mLs by mouth 2 (two) times daily between meals. Qty: 1 Can, Refills: 0    folic acid (FOLVITE) 1 MG tablet Take 1 tablet (1 mg total) by mouth daily.    spironolactone (ALDACTONE) 100 MG tablet Take 1 tablet (100 mg  total) by mouth daily.    thiamine 100 MG tablet Take 1 tablet (100 mg total) by mouth daily.     !! - Potential duplicate medications found. Please discuss with provider.    CONTINUE these medications which have CHANGED   Details  amitriptyline (ELAVIL) 25 MG tablet Take 1 tablet (25 mg total) by mouth at bedtime. Qty: 30 tablet, Refills: 0    clonazePAM (KLONOPIN) 0.5 MG tablet Take 0.5 tablets (0.25 mg total) by mouth 2 (two) times daily. Qty: 30 tablet, Refills: 0      CONTINUE these medications which have NOT CHANGED   Details  diclofenac sodium (VOLTAREN) 1 % GEL Apply 4 g topically as needed (for back of leg pain).    furosemide (LASIX) 40 MG tablet Take 40 mg by mouth daily. Refills: 2    insulin aspart (NOVOLOG FLEXPEN) 100 UNIT/ML FlexPen Inject 6 Units into the skin 3 (three) times daily with meals. For diabetes Qty: 15 mL, Refills: 11    lactulose (CHRONULAC) 10 GM/15ML solution Take 45 mLs (30 g total) by mouth 3 (three) times daily. Qty: 240 mL, Refills: 0    Multiple Vitamin (MULTIVITAMIN WITH MINERALS) TABS tablet Take 1 tablet by mouth daily.    pantoprazole (PROTONIX) 40 MG tablet Take 40 mg by mouth 2 (two) times daily. Refills: 2    !! feeding supplement, ENSURE ENLIVE, (ENSURE ENLIVE) LIQD Take 237 mLs by mouth 2 (two) times daily between meals. Qty: 237 mL, Refills: 12    magnesium oxide (MAG-OX) 400 (241.3 MG) MG tablet Take 1 tablet (400 mg total) by mouth 2 (two) times daily. Qty: 60 tablet, Refills: 0     !! - Potential duplicate medications found. Please discuss with provider.    STOP taking these medications     Insulin Glargine (LANTUS SOLOSTAR) 100 UNIT/ML Solostar Pen      oxyCODONE (OXY IR/ROXICODONE) 5 MG immediate release tablet        No Known Allergies    The results of significant diagnostics from this hospitalization (including imaging, microbiology, ancillary and laboratory) are listed below for reference.    Significant  Diagnostic Studies: US Paracentesis  10/03/2014   INDICATION: Cirrhosis, recurrent ascites and request for paracentesis.  EXAM: ULTRASOUND-GUIDED PARACENTESIS  COMPARISON:  US Paracentesis 09/27/14.  MEDICATIONS: None.  COMPLICATIONS: None immediate  TECHNIQUE: Informed written consent was obtained from the patient after a discussion of the risks, benefits and alternatives to treatment. A timeout was performed prior to the initiation of the procedure.  Initial ultrasound scanning demonstrates a large amount of ascites within the left lower abdominal quadrant. The left lower abdomen was prepped and draped in the usual sterile fashion. 1% lidocaine was used for local anesthesia.  An ultrasound image was saved for documentation purposed. A 6 Fr Safe-T-Centesis catheter was introduced. The paracentesis was performed. The catheter was removed and a dressing was applied. The patient tolerated the procedure well without immediate post procedural complication.  FINDINGS: A total of approximately 5.5 liters of serous fluid was removed.  IMPRESSION: Successful ultrasound-guided paracentesis yielding 5.5 liters of peritoneal fluid.  Read By:  Tsosie Billing PA-C   Electronically Signed  By: Marybelle Killings M.D.   On: 10/03/2014 12:26   US Paracentesis  09/27/2014   CLINICAL DATA:  Recurrent large volume ascites. Cirrhosis. Abdominaldistention.  EXAM: ULTRASOUND GUIDED PARACENTESIS  TECHNIQUE: The procedure, risks (including but not limited to bleeding, infection, organ damage ), benefits, and alternatives were explained to the patient. Questions regarding the procedure were encouraged and answered. The patient understands and consents to the procedure. Survey ultrasound of the abdomen was performed and an appropriate skin entry site in the right lower abdomen was selected. Skin site was marked, prepped with Betadine, and draped in usual sterile fashion, and infiltrated locally with 1% lidocaine. A Safe-T-Centesis needle was  advanced into the peritoneal space until fluid could be aspirated. The sheath was advanced and the needle removed. 4.5 L of clear yellowascites were aspirated. Samples sent for the requested laboratory studies.  COMPLICATIONS: COMPLICATIONS none  IMPRESSION: Technically successful ultrasound guided paracentesis, removing 4.5 L of ascites.   Electronically Signed   By: Lucrezia Europe M.D.   On: 09/27/2014 10:54    Microbiology: Recent Results (from the past 240 hour(s))  Body fluid culture     Status: None   Collection Time: 09/27/14 10:14 AM  Result Value Ref Range Status   Specimen Description PERITONEAL FLUID  Final   Special Requests NONE  Final   Gram Stain   Final    FEW WBC PRESENT, PREDOMINANTLY MONONUCLEAR NO ORGANISMS SEEN Performed at [facility]    Culture   Final    NO GROWTH 3 DAYS Performed at [facility]    Report Status 10/01/2014 FINAL  Final     Labs: Basic Metabolic Panel:  Recent Labs Lab 10/01/14 0900 10/02/14 0549 10/03/14 0438 10/04/14 0414 10/06/14 0330  NA 134* 135 133* 132* 136  K 4.1 4.1 3.9 3.6 3.6  CL 100* 103 99* 99* 103  CO2 25 26 26 28 29   GLUCOSE 180* 114* 124* 113* 141*  BUN 25* 25* 23* 23* 16  CREATININE 1.28* 1.36* 1.34* 1.20 0.94  CALCIUM 8.0* 7.8* 7.8* 7.8* 8.2*  PHOS  --   --   --   --  2.8   Liver Function Tests:  Recent Labs Lab 10/01/14 0900 10/02/14 0549 10/03/14 0438 10/04/14 0414 10/06/14 0330  AST 59* 54* 56* 43*  --   ALT 24 22 23 18   --   ALKPHOS 90 87 89 63  --   BILITOT 3.6* 2.7* 2.6* 2.9*  --   PROT 5.5* 5.1* 5.2* 4.6*  --   ALBUMIN 1.9* 1.9* 1.9* 2.3* 2.5*   No results for input(s): LIPASE, AMYLASE in the last 168 hours. No results for input(s): AMMONIA in the last 168 hours. CBC:  Recent Labs Lab 09/30/14 0546 10/01/14 0900 10/02/14 0549 10/06/14 0330  WBC 4.3 4.1 3.4* 3.5*  NEUTROABS  --   --  1.8 1.9  HGB 10.0* 10.8* 10.1* 9.1*  HCT 29.5* 32.7* 30.1* 27.1*  MCV 90.8 91.1 91.8  93.1  PLT 56* 61* 50* 33*   Cardiac Enzymes: No results for input(s): CKTOTAL, CKMB, CKMBINDEX, TROPONINI in the last 168 hours. BNP: BNP (last 3 results)  Recent Labs  08/14/14 1910  BNP 29.3    ProBNP (last 3 results)  Recent Labs  02/08/14 1830 02/10/14 1547  PROBNP 966.3* 805.5*    CBG:  Recent Labs Lab 10/03/14 1652 10/03/14 1950 10/03/14 2353 10/04/14 0353 10/04/14 0856  GLUCAP 119* 169* 165* 125* 165*  SignedNita Sells  [facility] 10/06/2014, 12:34 PM

## 2014-10-06 NOTE — Clinical Social Work Note (Signed)
CSW talked with Dr. Reynaldo Minium, medical director for [facility] regarding patient and recent drug use. Patient can d/c to his facility Tuesday with a letter of guarantee.  Blinda Turek Givens, MSW, LCSW Licensed Clinical Social Worker [place] [phone]

## 2014-10-07 NOTE — Progress Notes (Signed)
PT Cancellation Note  Patient Details Name: KAENAN JAKE MRN: [id number] DOB: 1956-04-01   Cancelled Treatment:    Reason Eval/Treat Not Completed: Pt is to d/c to SNF today. If discharge is delayed will check back tomorrow.    Rolinda Roan 10/07/2014, 7:41 AM  Rolinda Roan, PT, DPT Acute Rehabilitation Services Pager: [phone]

## 2014-10-07 NOTE — Progress Notes (Signed)
Physical Therapy Treatment Patient Details Name: [name] MRN: [id number] DOB: [birth year] Today's Date: 10/07/2014    History of Present Illness 59 yo male with h/o etoh cirrhosis of the liver, hepatic encephalopathy, noncompliance comes in with one month of worsening swelling in his testicles, abdomen and legs. Mildly confused. Says he has not drank any etoh in 3 years. Also has not been taking his medications at home at all. Came to the ED because he is unable to urinate. Denies sob or pain anywhere. No fevers.     PT Comments    Berg Score of 43/56 indicates significant fall risk; Will benefit from consistent rehab at SNF to work on balance and maximize independence and safety with mobility;   Overall progressing well; Anticipate continuing good progress at post-acute rehabilitation.   Follow Up Recommendations  SNF;Supervision/Assistance - 24 hour     Equipment Recommendations  Rolling walker with 5" wheels    Recommendations for Other Services       Precautions / Restrictions Precautions Precautions: Fall Precaution Comments: Fall risk greatly reduced with use of RW    Mobility  Bed Mobility                  Transfers     Transfers: Sit to/from Stand Sit to Stand: Min guard         General transfer comment: Wide base of support; relies on UE support on armrests or supporting self on knees  Ambulation/Gait Ambulation/Gait assistance: Min guard;Min assist Ambulation Distance (Feet): 100 Feet Assistive device: Rolling walker (2 wheeled);Straight cane Gait Pattern/deviations: Decreased stance time - left;Ataxic Gait velocity: approaching WFL   General Gait Details: Noted gait assymetry including decr stance time LLE and heavy footfall RLE; wide BOS; Noted 3 losses of balance with cane requiring min assist to steady   Stairs            Wheelchair Mobility    Modified Rankin (Stroke Patients Only)       Balance              Standing balance-Leahy Scale:  (approaching Fair)                      Cognition Arousal/Alertness: Awake/alert Behavior During Therapy: WFL for tasks assessed/performed Overall Cognitive Status: History of cognitive impairments - at baseline                 General Comments: Generally poor safety awareness.    Exercises      General Comments        Pertinent Vitals/Pain Pain Assessment: 0-10 Pain Score: 8  Pain Location: back pain Pain Descriptors / Indicators: Aching Pain Intervention(s): Monitored during session    Home Living                      Prior Function            PT Goals (current goals can now be found in the care plan section) Acute Rehab PT Goals Patient Stated Goal: to get better PT Goal Formulation: With patient Time For Goal Achievement: 10/15/14 Potential to Achieve Goals: Good Progress towards PT goals: Progressing toward goals    Frequency  Min 2X/week    PT Plan Current plan remains appropriate    Co-evaluation             End of Session Equipment Utilized During Treatment: Gait belt Activity Tolerance: Patient tolerated treatment well Patient left:  in bed;with call bell/phone within reach     Time: 1515-1534 PT Time Calculation (min) (ACUTE ONLY): 19 min  Charges:  $Gait Training: 8-22 mins                    G Codes:      Quin Hoop 10/07/2014, 3:40 PM  Roney Marion, [place] [employer] Pager [phone] Office [phone]

## 2014-10-07 NOTE — Progress Notes (Signed)
Report given to Antoinette at [facility].  Patient to be transported via ambulance

## 2014-10-07 NOTE — Discharge Summary (Signed)
patient seen examined as addendum-stable for d/c to [facility] consideration rpt Paracentsis 5/18 am at [facility]   Physician Discharge Summary  [name] MPN:[id number] DOB: 01-Sep-1955 DOA: 09/26/2014  PCP: Barbette Merino, MD  Admit date: 09/26/2014 Discharge date: 10/07/2014  Time spent: 35 minutes  Recommendations for Outpatient Follow-up:  1. Needs paracentesis 10/08/14 at 1 PM-please transport to [facility] outpatient to ge tthis done 2. Will copy Dr. Paulita Fujita on this D/c summary-he will need OP follow up-kindly arrange this 3. Uptritrate diuretic in 3-4 days if still swollen-would not do this earlier as 1/2 life aldactone 3 days and takes time to act 4. Scale back lactulose if copious diarrhea-goal is to have 1-2 loose stools a day 5. Get CMET, INR,CBC 1 week   Discharge Diagnoses:  Principal Problem:   Acute encephalopathy Active Problems:   Alcohol abuse   Diabetes mellitus   Acute kidney injury   Hx of bipolar disorder   Alcoholic cirrhosis of liver with ascites   Medically noncompliant   Discharge Condition: fair-guarded if continued ETOH  Diet recommendation: Patient to be d/c to SNF on 2 gram per day salt diet, and 1200 cc fluid restriction  Filed Weights   10/04/14 2138 10/06/14 0546 10/06/14 2020  Weight: 104.327 kg (230 lb) 90.629 kg (199 lb 12.8 oz) 99.338 kg (219 lb)    History of present illness:   59 year old male known EtOH, Encephalopathy, noncompliance, admitted this 09/26/14 with anasarca. he was admitted and aggressively diuresed with Lasix initially and then paracentesis to X3 this admission. He'll be discharging to a nursing facility  Anasarca/worsening ascites/scrotal swelling -due to cirrhosis, hypoalbuminemia (1.7), diastolic CHF -was on IV lasix 40mg  q12, negative output unreliable  --Urine Na 59/Urine K 30 -s/p Paracentesis 5/7, 4.5L removed, no SBP -s/p rpt Para 5/13-5 liters out S/p Para 5/16-4.7 liters out  -Rd consult appreciated -follow  I/Os, weights have been unreliable -overall still very swollen==> d/c doses of aldactone 200/Lasix 80  AKi on CKD -baseline creatinine normal -due to third spacing/intravascular volume depletion  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 Paulita Fujita and he will need follow-up with him -Drug screen from last month Cocaine +  Chronic anemia and thrombocytopenia -stable, Hb higher than baseline, likely hemoconcentration -recent EGD: Mild to moderate portal gastropathy. Minimal nonbleeding esophageal varices -continue PPI, monitor Hb  Substance abuse -UDS + for benzos and cocaine in past -reported on narcotics per PCP for chronic pain  COPD -stable  Diabetes mellitus, type II -Hemoglobin A1c 5.9 (08/14/2014) -Continue insulin sliding scale CBG monitoring--cbgs 100 ranges -no insulin on d/c  Chronic Diastolic CHF -Echocardiogram 08/15/2014: EF 60-65%. Grade 1 diastolic dysfunction  -Patient does have lower extremity swelling which she states is chronic -diuresis as tolerated as above  Hx bipolar d/o    Discharge Exam: Filed Vitals:   10/07/14 0956  BP: 125/59  Pulse: 79  Temp: 98.2 F (36.8 C)  Resp: 18    General: alert pleasant in no pain Cardiovascular: s1 s 2no m/r/g Respiratory: clear  Discharge Instructions   Discharge Instructions    Diet - low sodium heart healthy    Complete by:  As directed      Diet - low sodium heart healthy    Complete by:  As directed      Increase activity slowly    Complete by:  As directed  Increase activity slowly    Complete by:  As directed           Current Discharge Medication List    START taking these medications   Details  folic acid (FOLVITE) 1 MG tablet Take 1 tablet (1 mg total) by mouth daily.    spironolactone (ALDACTONE) 100 MG tablet Take 2 tablets (200  mg total) by mouth daily. Qty: 60 tablet, Refills: 0    thiamine 100 MG tablet Take 1 tablet (100 mg total) by mouth daily.      CONTINUE these medications which have CHANGED   Details  amitriptyline (ELAVIL) 25 MG tablet Take 1 tablet (25 mg total) by mouth at bedtime. Qty: 30 tablet, Refills: 0    clonazePAM (KLONOPIN) 0.5 MG tablet Take 0.5 tablets (0.25 mg total) by mouth 2 (two) times daily. Qty: 30 tablet, Refills: 0    feeding supplement, GLUCERNA SHAKE, (GLUCERNA SHAKE) LIQD Take 237 mLs by mouth 2 (two) times daily between meals. Qty: 1 Can, Refills: 0    furosemide (LASIX) 80 MG tablet Take 1 tablet (80 mg total) by mouth daily. Qty: 30 tablet      CONTINUE these medications which have NOT CHANGED   Details  diclofenac sodium (VOLTAREN) 1 % GEL Apply 4 g topically as needed (for back of leg pain).    insulin aspart (NOVOLOG FLEXPEN) 100 UNIT/ML FlexPen Inject 6 Units into the skin 3 (three) times daily with meals. For diabetes Qty: 15 mL, Refills: 11    lactulose (CHRONULAC) 10 GM/15ML solution Take 45 mLs (30 g total) by mouth 3 (three) times daily. Qty: 240 mL, Refills: 0    Multiple Vitamin (MULTIVITAMIN WITH MINERALS) TABS tablet Take 1 tablet by mouth daily.    pantoprazole (PROTONIX) 40 MG tablet Take 40 mg by mouth 2 (two) times daily. Refills: 2    magnesium oxide (MAG-OX) 400 (241.3 MG) MG tablet Take 1 tablet (400 mg total) by mouth 2 (two) times daily. Qty: 60 tablet, Refills: 0      STOP taking these medications     Insulin Glargine (LANTUS SOLOSTAR) 100 UNIT/ML Solostar Pen      oxyCODONE (OXY IR/ROXICODONE) 5 MG immediate release tablet        No Known Allergies    The results of significant diagnostics from this hospitalization (including imaging, microbiology, ancillary and laboratory) are listed below for reference.    Significant Diagnostic Studies: US Paracentesis  10/06/2014   INDICATION: Cirrhosis, recurrent ascites and request  for paracentesis.  EXAM: ULTRASOUND-GUIDED PARACENTESIS  COMPARISON:  Paracentesis 10/03/14.  MEDICATIONS: None.  COMPLICATIONS: None immediate  TECHNIQUE: Informed written consent was obtained from the patient after a discussion of the risks, benefits and alternatives to treatment. A timeout was performed prior to the initiation of the procedure.  Initial ultrasound scanning demonstrates a large amount of ascites within the left lower abdominal quadrant. The left lower abdomen was prepped and draped in the usual sterile fashion. 1% lidocaine was used for local anesthesia.  An ultrasound image was saved for documentation purposed. A 6 Fr Safe-T-Centesis catheter was introduced. The paracentesis was performed. The catheter was removed and a dressing was applied. The patient tolerated the procedure well without immediate post procedural complication.  FINDINGS: A total of approximately 4.5 liters of serous fluid was removed.  IMPRESSION: Successful ultrasound-guided paracentesis yielding 4.5 liters of peritoneal fluid.  Read By:  Tsosie Billing PA-C   Electronically Signed   By: Scherrie Gerlach.D.  On: 10/06/2014 11:31   US Paracentesis  10/03/2014   INDICATION: Cirrhosis, recurrent ascites and request for paracentesis.  EXAM: ULTRASOUND-GUIDED PARACENTESIS  COMPARISON:  US Paracentesis 09/27/14.  MEDICATIONS: None.  COMPLICATIONS: None immediate  TECHNIQUE: Informed written consent was obtained from the patient after a discussion of the risks, benefits and alternatives to treatment. A timeout was performed prior to the initiation of the procedure.  Initial ultrasound scanning demonstrates a large amount of ascites within the left lower abdominal quadrant. The left lower abdomen was prepped and draped in the usual sterile fashion. 1% lidocaine was used for local anesthesia.  An ultrasound image was saved for documentation purposed. A 6 Fr Safe-T-Centesis catheter was introduced. The paracentesis was performed. The  catheter was removed and a dressing was applied. The patient tolerated the procedure well without immediate post procedural complication.  FINDINGS: A total of approximately 5.5 liters of serous fluid was removed.  IMPRESSION: Successful ultrasound-guided paracentesis yielding 5.5 liters of peritoneal fluid.  Read By:  Tsosie Billing PA-C   Electronically Signed   By: Marybelle Killings M.D.   On: 10/03/2014 12:26   US Paracentesis  09/27/2014   CLINICAL DATA:  Recurrent large volume ascites. Cirrhosis. Abdominaldistention.  EXAM: ULTRASOUND GUIDED PARACENTESIS  TECHNIQUE: The procedure, risks (including but not limited to bleeding, infection, organ damage ), benefits, and alternatives were explained to the patient. Questions regarding the procedure were encouraged and answered. The patient understands and consents to the procedure. Survey ultrasound of the abdomen was performed and an appropriate skin entry site in the right lower abdomen was selected. Skin site was marked, prepped with Betadine, and draped in usual sterile fashion, and infiltrated locally with 1% lidocaine. A Safe-T-Centesis needle was advanced into the peritoneal space until fluid could be aspirated. The sheath was advanced and the needle removed. 4.5 L of clear yellowascites were aspirated. Samples sent for the requested laboratory studies.  COMPLICATIONS: COMPLICATIONS none  IMPRESSION: Technically successful ultrasound guided paracentesis, removing 4.5 L of ascites.   Electronically Signed   By: Lucrezia Europe M.D.   On: 09/27/2014 10:54    Microbiology: No results found for this or any previous visit (from the past 240 hour(s)).   Labs: Basic Metabolic Panel:  Recent Labs Lab 10/01/14 0900 10/02/14 0549 10/03/14 0438 10/04/14 0414 10/06/14 0330  NA 134* 135 133* 132* 136  K 4.1 4.1 3.9 3.6 3.6  CL 100* 103 99* 99* 103  CO2 25 26 26 28 29   GLUCOSE 180* 114* 124* 113* 141*  BUN 25* 25* 23* 23* 16  CREATININE 1.28* 1.36* 1.34* 1.20  0.94  CALCIUM 8.0* 7.8* 7.8* 7.8* 8.2*  PHOS  --   --   --   --  2.8   Liver Function Tests:  Recent Labs Lab 10/01/14 0900 10/02/14 0549 10/03/14 0438 10/04/14 0414 10/06/14 0330  AST 59* 54* 56* 43*  --   ALT 24 22 23 18   --   ALKPHOS 90 87 89 63  --   BILITOT 3.6* 2.7* 2.6* 2.9*  --   PROT 5.5* 5.1* 5.2* 4.6*  --   ALBUMIN 1.9* 1.9* 1.9* 2.3* 2.5*   No results for input(s): LIPASE, AMYLASE in the last 168 hours. No results for input(s): AMMONIA in the last 168 hours. CBC:  Recent Labs Lab 10/01/14 0900 10/02/14 0549 10/06/14 0330  WBC 4.1 3.4* 3.5*  NEUTROABS  --  1.8 1.9  HGB 10.8* 10.1* 9.1*  HCT 32.7* 30.1* 27.1*  MCV 91.1  91.8 93.1  PLT 61* 50* 33*   Cardiac Enzymes: No results for input(s): CKTOTAL, CKMB, CKMBINDEX, TROPONINI in the last 168 hours. BNP: BNP (last 3 results)  Recent Labs  08/14/14 1910  BNP 29.3    ProBNP (last 3 results)  Recent Labs  02/08/14 1830 02/10/14 1547  PROBNP 966.3* 805.5*    CBG:  Recent Labs Lab 10/03/14 1652 10/03/14 1950 10/03/14 2353 10/04/14 0353 10/04/14 0856  GLUCAP 119* 169* 165* 125* 165*       Signed:  Nita Sells  [facility] 10/07/2014, 11:30 AM

## 2014-10-07 NOTE — Clinical Social Work Note (Signed)
Patient has [employer] and [facility] accepted a 5-day LOG prior to getting authorization so that patient could discharge today. Clinicals provided to facility to assist with insurance authorization.   Lanisha Stepanian Givens, MSW, LCSW Licensed Clinical Social Worker [place] [phone]

## 2014-10-07 NOTE — Clinical Social Work Placement (Signed)
   CLINICAL SOCIAL WORK PLACEMENT  NOTE  Date:  10/07/2014  Patient Details  Name: [name] MRN: [id number] Date of Birth: 1956-04-13  Clinical Social Work is seeking post-discharge placement for this patient at the [place] level of care (*CSW will initial, date and re-position this form in  chart as items are completed):  Yes   Patient/family provided with [facility] Work Department's list of facilities offering this level of care within the geographic area requested by the patient (or if unable, by the patient's family).  Yes   Patient/family informed of their freedom to choose among providers that offer the needed level of care, that participate in Medicare, Medicaid or managed care program needed by the patient, have an available bed and are willing to accept the patient.  Yes   Patient/family informed of [facility]'s ownership interest in [facility] and [facility], as well as of the fact that they are under no obligation to receive care at these facilities.  PASRR submitted to EDS on   2016    PASRR number received on  2016     Existing PASRR number confirmed on 10/02/14     FL2 transmitted to all facilities in geographic area requested by pt/family on  10/02/14     FL2 transmitted to all facilities within larger geographic area on       Patient informed that his/her managed care company has contracts with or will negotiate with certain facilities, including the following:         10/03/14   Patient/family informed of bed offers received.  Patient chooses bed at  [place] recommends and patient chooses bed at      Patient to be transferred to  [facility] on  10/07/14.  Patient to be transferred to facility by  ambulance     Patient family notified on  10/07/14 of transfer.  Name of family member notified:   Serita Butcher, girlfriend   PHYSICIAN       Additional Comment:     _______________________________________________ Sable Feil, LCSW 10/07/2014, 7:01 PM

## 2014-10-08 ENCOUNTER — Ambulatory Visit (HOSPITAL_COMMUNITY)
Admit: 2014-10-08 | Discharge: 2014-10-08 | Disposition: A | Discharge status: Home / Self Care | Payer: Medicare PPO | Attending: Family Medicine | Admitting: Family Medicine

## 2014-10-08 ENCOUNTER — Other Ambulatory Visit: Payer: Self-pay | Admitting: Family Medicine

## 2014-10-08 DIAGNOSIS — R188 Other ascites: Secondary | ICD-10-CM

## 2014-10-08 MED ORDER — LIDOCAINE HCL (PF) 1 % IJ SOLN
INTRAMUSCULAR | Status: AC
  Filled 2014-10-08: qty 10

## 2014-10-10 ENCOUNTER — Other Ambulatory Visit (HOSPITAL_COMMUNITY): Payer: Self-pay | Admitting: Internal Medicine

## 2014-10-10 DIAGNOSIS — K7031 Alcoholic cirrhosis of liver with ascites: Secondary | ICD-10-CM

## 2014-10-15 ENCOUNTER — Ambulatory Visit (HOSPITAL_COMMUNITY)
Admission: RE | Admit: 2014-10-15 | Discharge: 2014-10-15 | Disposition: A | Discharge status: Home / Self Care | Payer: Medicare PPO | Source: Ambulatory Visit | Attending: Internal Medicine | Admitting: Internal Medicine

## 2014-10-15 DIAGNOSIS — R188 Other ascites: Secondary | ICD-10-CM | POA: Insufficient documentation

## 2014-10-15 DIAGNOSIS — K7031 Alcoholic cirrhosis of liver with ascites: Secondary | ICD-10-CM

## 2014-10-15 MED ORDER — LIDOCAINE HCL (PF) 1 % IJ SOLN
INTRAMUSCULAR | Status: AC
  Filled 2014-10-15: qty 10

## 2014-10-15 NOTE — Procedures (Signed)
Successful US guided paracentesis from RLQ.  Yielded 3.4 Liters of clear yellow fluid.  No immediate complications.  Pt tolerated well.   Specimen was not sent for labs.  Gareth Eagle R PA-C 10/15/2014 3:27 PM

## 2014-10-16 ENCOUNTER — Other Ambulatory Visit: Payer: Self-pay | Admitting: Gastroenterology

## 2014-10-16 DIAGNOSIS — K7460 Unspecified cirrhosis of liver: Secondary | ICD-10-CM

## 2014-10-16 DIAGNOSIS — C220 Liver cell carcinoma: Secondary | ICD-10-CM

## 2014-10-24 ENCOUNTER — Other Ambulatory Visit: Payer: Self-pay

## 2014-10-31 ENCOUNTER — Emergency Department (HOSPITAL_COMMUNITY)
Admission: EM | Admit: 2014-10-31 | Discharge: 2014-10-31 | Disposition: A | Discharge status: Home / Self Care | Payer: Medicare PPO | Attending: Emergency Medicine | Admitting: Emergency Medicine

## 2014-10-31 ENCOUNTER — Emergency Department (HOSPITAL_COMMUNITY): Payer: Medicare PPO

## 2014-10-31 ENCOUNTER — Encounter (HOSPITAL_COMMUNITY): Payer: Self-pay | Admitting: *Deleted

## 2014-10-31 DIAGNOSIS — K7290 Hepatic failure, unspecified without coma: Secondary | ICD-10-CM | POA: Diagnosis not present

## 2014-10-31 DIAGNOSIS — I10 Essential (primary) hypertension: Secondary | ICD-10-CM | POA: Diagnosis not present

## 2014-10-31 DIAGNOSIS — Z794 Long term (current) use of insulin: Secondary | ICD-10-CM | POA: Diagnosis not present

## 2014-10-31 DIAGNOSIS — F3131 Bipolar disorder, current episode depressed, mild: Secondary | ICD-10-CM | POA: Diagnosis not present

## 2014-10-31 DIAGNOSIS — R188 Other ascites: Secondary | ICD-10-CM | POA: Diagnosis not present

## 2014-10-31 DIAGNOSIS — R2243 Localized swelling, mass and lump, lower limb, bilateral: Secondary | ICD-10-CM | POA: Diagnosis not present

## 2014-10-31 DIAGNOSIS — Z8673 Personal history of transient ischemic attack (TIA), and cerebral infarction without residual deficits: Secondary | ICD-10-CM | POA: Insufficient documentation

## 2014-10-31 DIAGNOSIS — K219 Gastro-esophageal reflux disease without esophagitis: Secondary | ICD-10-CM | POA: Diagnosis not present

## 2014-10-31 DIAGNOSIS — R269 Unspecified abnormalities of gait and mobility: Secondary | ICD-10-CM | POA: Diagnosis not present

## 2014-10-31 DIAGNOSIS — Z79899 Other long term (current) drug therapy: Secondary | ICD-10-CM | POA: Diagnosis not present

## 2014-10-31 DIAGNOSIS — E119 Type 2 diabetes mellitus without complications: Secondary | ICD-10-CM | POA: Insufficient documentation

## 2014-10-31 DIAGNOSIS — G8929 Other chronic pain: Secondary | ICD-10-CM | POA: Diagnosis not present

## 2014-10-31 DIAGNOSIS — D649 Anemia, unspecified: Secondary | ICD-10-CM | POA: Insufficient documentation

## 2014-10-31 DIAGNOSIS — R140 Abdominal distension (gaseous): Secondary | ICD-10-CM | POA: Diagnosis present

## 2014-10-31 DIAGNOSIS — J449 Chronic obstructive pulmonary disease, unspecified: Secondary | ICD-10-CM | POA: Diagnosis not present

## 2014-10-31 DIAGNOSIS — Z720 Tobacco use: Secondary | ICD-10-CM | POA: Diagnosis not present

## 2014-10-31 DIAGNOSIS — M1990 Unspecified osteoarthritis, unspecified site: Secondary | ICD-10-CM | POA: Insufficient documentation

## 2014-10-31 LAB — COMPREHENSIVE METABOLIC PANEL
ALK PHOS: 109 U/L (ref 38–126)
ALT: 33 U/L (ref 17–63)
AST: 59 U/L — AB (ref 15–41)
Albumin: 2.6 g/dL — ABNORMAL LOW (ref 3.5–5.0)
Anion gap: 7 (ref 5–15)
BUN: 23 mg/dL — AB (ref 6–20)
CALCIUM: 8 mg/dL — AB (ref 8.9–10.3)
CO2: 21 mmol/L — ABNORMAL LOW (ref 22–32)
Chloride: 100 mmol/L — ABNORMAL LOW (ref 101–111)
Creatinine, Ser: 1.27 mg/dL — ABNORMAL HIGH (ref 0.61–1.24)
GFR calc Af Amer: >60 mL/min (ref >60)
GFR calc non Af Amer: >60 mL/min (ref >60)
Glucose, Bld: 139 mg/dL — ABNORMAL HIGH (ref 65–99)
Potassium: 4.6 mmol/L (ref 3.5–5.1)
Sodium: 128 mmol/L — ABNORMAL LOW (ref 135–145)
Total Bilirubin: 2.6 mg/dL — ABNORMAL HIGH (ref 0.3–1.2)
Total Protein: 5.9 g/dL — ABNORMAL LOW (ref 6.5–8.1)

## 2014-10-31 LAB — CBC WITH DIFFERENTIAL/PLATELET
BASOS ABS: 0 10*3/uL (ref 0.0–0.1)
Basophils Relative: 0 % (ref 0–1)
Eosinophils Absolute: 0.3 10*3/uL (ref 0.0–0.7)
Eosinophils Relative: 6 % — ABNORMAL HIGH (ref 0–5)
HEMATOCRIT: 27.2 % — AB (ref 39.0–52.0)
Hemoglobin: 9.3 g/dL — ABNORMAL LOW (ref 13.0–17.0)
LYMPHS PCT: 18 % (ref 12–46)
Lymphs Abs: 0.8 10*3/uL (ref 0.7–4.0)
MCH: 32 pg (ref 26.0–34.0)
MCHC: 34.2 g/dL (ref 30.0–36.0)
MCV: 93.5 fL (ref 78.0–100.0)
MONO ABS: 0.5 10*3/uL (ref 0.1–1.0)
MONOS PCT: 11 % (ref 3–12)
NEUTROS PCT: 65 % (ref 43–77)
Neutro Abs: 2.9 10*3/uL (ref 1.7–7.7)
Platelets: 65 10*3/uL — ABNORMAL LOW (ref 150–400)
RBC: 2.91 MIL/uL — ABNORMAL LOW (ref 4.22–5.81)
RDW: 19.8 % — AB (ref 11.5–15.5)
WBC: 4.5 10*3/uL (ref 4.0–10.5)

## 2014-10-31 LAB — PROTIME-INR
INR: 1.53 — ABNORMAL HIGH (ref 0.00–1.49)
Prothrombin Time: 18.4 seconds — ABNORMAL HIGH (ref 11.6–15.2)

## 2014-10-31 MED ORDER — LIDOCAINE HCL (PF) 1 % IJ SOLN
INTRAMUSCULAR | Status: AC
  Filled 2014-10-31: qty 10

## 2014-10-31 NOTE — Discharge Instructions (Signed)
Ascites Call Dr. Leontine Locket office today to schedule an office appointment for next week. Your blood chemistries including sodium, calcium, kidney function and blood sugar should be rechecked. Return if you feel worse for any reason. Ascites is a gathering of fluid in the belly (abdomen). This is most often caused by liver disease. It may also be caused by a number of other less common problems. It causes a ballooning out (distension) of the abdomen. CAUSES  Scarring of the liver (cirrhosis) is the most common cause of ascites. Other causes include:  Infection or inflammation in the abdomen.  Cancer in the abdomen.  Heart failure.  Certain forms of kidney failure (nephritic syndrome).  Inflammation of the pancreas.  Clots in the veins of the liver. SYMPTOMS  In the early stages of ascites, you may not have any symptoms. The main symptom of ascites is a sense of abdominal bloating. This is due to the presence of fluid. This may also cause an increase in abdominal or waist size. People with this condition can develop swelling in the legs, and men can develop a swollen scrotum. When there is a lot of fluid, it may be hard to breath. Stretching of the abdomen by fluid can be painful. DIAGNOSIS  Certain features of your medical history, such as a history of liver disease and of an enlarging abdomen, can suggest the presence of ascites. The diagnosis of ascites can be made on physical exam by your caregiver. An abdominal ultrasound examination can confirm that ascites is present, and estimate the amount of fluid. Once ascites is confirmed, it is important to determine its cause. Again, a history of one of the conditions listed in "CAUSES" provides a strong clue. A physical exam is important, and blood and X-ray tests may be needed. During a procedure called paracentesis, a sample of fluid is removed from the abdomen. This can determine certain key features about the fluid, such as whether or not  infection or cancer is present. Your caregiver will determine if a paracentesis is necessary. They will describe the procedure to you. PREVENTION  Ascites is a complication of other conditions. Therefore to prevent ascites, you must seek treatment for any significant health conditions you have. Once ascites is present, careful attention to fluid and salt intake may help prevent it from getting worse. If you have ascites, you should not drink alcohol. PROGNOSIS  The prognosis of ascites depends on the underlying disease. If the disease is reversible, such as with certain infections or with heart failure, then ascites may improve or disappear. When ascites is caused by cirrhosis, then it indicates that the liver disease has worsened, and further evaluation and treatment of the liver disease is needed. If your ascites is caused by cancer, then the success or failure of the cancer treatment will determine whether your ascites will improve or worsen. RISKS AND COMPLICATIONS  Ascites is likely to worsen if it is not properly diagnosed and treated. A large amount of ascites can cause pain and difficulty breathing. The main complication, besides worsening, is infection (called spontaneous bacterial peritonitis). This requires prompt treatment. TREATMENT  The treatment of ascites depends on its cause. When liver disease is your cause, medical management using water pills (diuretics) and decreasing salt intake is often effective. Ascites due to peritoneal inflammation or malignancy (cancer) alone does not respond to salt restriction and diuretics. Hospitalization is sometimes required. If the treatment of ascites cannot be managed with medications, a number of other treatments are available. Your  caregivers will help you decide which will work best for you. Some of these are:  Removal of fluid from the abdomen (paracentesis).  Fluid from the abdomen is passed into a vein (peritoneovenous shunting).  Liver  transplantation.  Transjugular intrahepatic portosystemic stent shunt. HOME CARE INSTRUCTIONS  It is important to monitor body weight and the intake and output of fluids. Weigh yourself at the same time every day. Record your weights. Fluid restriction may be necessary. It is also important to know your salt intake. The more salt you take in, the more fluid you will retain. Ninety percent of people with ascites respond to this approach.  Follow any directions for medicines carefully.  Follow up with your caregiver, as directed.  Report any changes in your health, especially any new or worsening symptoms.  If your ascites is from liver disease, avoid alcohol and other substances toxic to the liver. SEEK MEDICAL CARE IF:   Your weight increases more than a few pounds in a few days.  Your abdominal or waist size increases.  You develop swelling in your legs.  You had swelling and it worsens. SEEK IMMEDIATE MEDICAL CARE IF:   You develop a fever.  You develop new abdominal pain.  You develop difficulty breathing.  You develop confusion.  You have bleeding from the mouth, stomach, or rectum. MAKE SURE YOU:   Understand these instructions.  Will watch your condition.  Will get help right away if you are not doing well or get worse. Document Released: 05/09/2005 Document Revised: 08/01/2011 Document Reviewed: 12/08/2006 [facility] Patient Information 2015 [place], [place]. This information is not intended to replace advice given to you by your health care provider. Make sure you discuss any questions you have with your health care provider.

## 2014-10-31 NOTE — Procedures (Signed)
Successful US guided paracentesis from LLQ.  Yielded 3.8 liters of serous fluid.  No immediate complications.  Pt tolerated well.   Specimen was not sent for labs.  Tsosie Billing D PA-C 10/31/2014 10:32 AM

## 2014-10-31 NOTE — ED Notes (Signed)
Pt c/o swelling in abd, leg swelling--- 3+ pitting edema from toes to knees bilat. Hx of liver failure.

## 2014-10-31 NOTE — ED Provider Notes (Signed)
CSN: [id number]     Arrival date & time 10/31/14  6160 History   First MD Initiated Contact with Patient 10/31/14 0740     Chief Complaint  Patient presents with  . Bloated  . Shortness of Breath     (Consider location/radiation/quality/duration/timing/severity/associated sxs/prior Treatment) HPI Patient reports feeling abdomen swollen and short of breath "wheezing" for one week progressively worsening. No other associated symptoms. Denies pain anywhere. No fever. He feels that he needs to have his abdomen "tapped" no treatment prior to coming here. Nothing makes symptoms better or worse. No other associated symptoms Past Medical History  Diagnosis Date  . Neuropathy   . Diabetes mellitus   . Bipolar affect, depressed   . Hypertension   . Arthritis   . Stroke     Mini stroke about 54yrs ago  . Cirrhosis   . Alcohol abuse   . Chronic pain   . Cocaine abuse   . Muscle spasm     both legs  . Encephalopathy, hepatic   . Detached retina   . COPD (chronic obstructive pulmonary disease)     emphysema  . Bronchitis   . Barrett's esophagus   . GERD (gastroesophageal reflux disease)     has ulcer  . Anemia    Past Surgical History  Procedure Laterality Date  . Fracture surgery      Leg and arm 38yrs ago  . Esophagogastroduodenoscopy  04/04/2012    Procedure: ESOPHAGOGASTRODUODENOSCOPY (EGD);  Surgeon: Irene Shipper, MD;  Location: [facility] ENDOSCOPY;  Service: Endoscopy;  Laterality: N/A;  . Esophagogastroduodenoscopy Left 03/13/2013    Procedure: ESOPHAGOGASTRODUODENOSCOPY (EGD);  Surgeon: Arta Silence, MD;  Location: [facility] ENDOSCOPY;  Service: Endoscopy;  Laterality: Left;  Marland Kitchen Eye surgery  8 months ago both eyes    cataracts both eyes, detached eye, gas pocket  . Vasectomy    . Pars plana vitrectomy Left 07/08/2013    Procedure: PARS PLANA VITRECTOMY WITH 25 GAUGE;  Surgeon: Hurman Horn, MD;  Location: [place];  Service: Ophthalmology;  Laterality: Left;  . Intraocular lens removal  Left 07/08/2013    Procedure: REMOVAL OF INTRAOCULAR LENS;  Surgeon: Hurman Horn, MD;  Location: [place];  Service: Ophthalmology;  Laterality: Left;  . Placement and suture of secondary intraocular lens Left 07/08/2013    Procedure: PLACEMENT AND SUTURE OF SECONDARY INTRAOCULAR LENS;  Surgeon: Hurman Horn, MD;  Location: [place];  Service: Ophthalmology;  Laterality: Left;  Insertion of Anterior Capsule Intraocular Lens   . Esophagogastroduodenoscopy N/A 01/16/2014    Procedure: ESOPHAGOGASTRODUODENOSCOPY (EGD);  Surgeon: Winfield Cunas., MD;  Location: [facility] ENDOSCOPY;  Service: Endoscopy;  Laterality: N/A;  . Colonoscopy N/A 01/17/2014    Procedure: COLONOSCOPY;  Surgeon: Winfield Cunas., MD;  Location: [facility] ENDOSCOPY;  Service: Endoscopy;  Laterality: N/A;  possible banding  . Esophagogastroduodenoscopy N/A 08/30/2014    Procedure: ESOPHAGOGASTRODUODENOSCOPY (EGD);  Surgeon: Wilford Corner, MD;  Location: [facility] ENDOSCOPY;  Service: Endoscopy;  Laterality: N/A;  bedside   Family History  Problem Relation Age of Onset  . Hypotension Mother    History  Substance Use Topics  . Smoking status: Current Every Day Smoker -- 1.00 packs/day for 30 years    Types: Cigarettes  . Smokeless tobacco: Never Used  . Alcohol Use: 0.0 oz/week     Comment: 12 pk beer daily  06/2013 - no alcohol since 11/2012    Review of Systems  Constitutional: Negative.   HENT: Negative.  Respiratory: Positive for shortness of breath.   Cardiovascular: Positive for leg swelling.  Gastrointestinal: Positive for abdominal distention.  Musculoskeletal: Positive for gait problem.       Walks with cane or walker  Skin: Negative.   Neurological: Negative.   Psychiatric/Behavioral: Negative.   All other systems reviewed and are negative.     Allergies  Review of patient's allergies indicates no known allergies.  Home Medications   Prior to Admission medications   Medication Sig Start Date End Date Taking?  Authorizing Provider  amitriptyline (ELAVIL) 25 MG tablet Take 1 tablet (25 mg total) by mouth at bedtime. 10/05/14   Nita Sells, MD  clonazePAM (KLONOPIN) 0.5 MG tablet Take 0.5 tablets (0.25 mg total) by mouth 2 (two) times daily. 10/06/14   Nita Sells, MD  diclofenac sodium (VOLTAREN) 1 % GEL Apply 4 g topically as needed (for back of leg pain).    Historical Provider, MD  feeding supplement, GLUCERNA SHAKE, (GLUCERNA SHAKE) LIQD Take 237 mLs by mouth 2 (two) times daily between meals. 10/06/14   Nita Sells, MD  folic acid (FOLVITE) 1 MG tablet Take 1 tablet (1 mg total) by mouth daily. 10/05/14   Nita Sells, MD  furosemide (LASIX) 80 MG tablet Take 1 tablet (80 mg total) by mouth daily. 10/06/14   Nita Sells, MD  insulin aspart (NOVOLOG FLEXPEN) 100 UNIT/ML FlexPen Inject 6 Units into the skin 3 (three) times daily with meals. For diabetes 02/19/14   Encarnacion Slates, NP  lactulose (CHRONULAC) 10 GM/15ML solution Take 45 mLs (30 g total) by mouth 3 (three) times daily. 09/03/14   Reyne Dumas, MD  magnesium oxide (MAG-OX) 400 (241.3 MG) MG tablet Take 1 tablet (400 mg total) by mouth 2 (two) times daily. Patient not taking: Reported on 09/26/2014 09/03/14   Reyne Dumas, MD  Multiple Vitamin (MULTIVITAMIN WITH MINERALS) TABS tablet Take 1 tablet by mouth daily. 07/31/14   Geradine Girt, DO  pantoprazole (PROTONIX) 40 MG tablet Take 40 mg by mouth 2 (two) times daily. 07/09/14   Historical Provider, MD  spironolactone (ALDACTONE) 100 MG tablet Take 2 tablets (200 mg total) by mouth daily. 10/06/14   Nita Sells, MD  thiamine 100 MG tablet Take 1 tablet (100 mg total) by mouth daily. 10/05/14   Nita Sells, MD   BP 104/64 mmHg  Pulse 96  Temp(Src) 98.4 F (36.9 C) (Oral)  Resp 12  SpO2 98% Physical Exam  Constitutional: He is oriented to person, place, and time.  Chronically ill-appearing  HENT:  Head: Normocephalic and atraumatic.  Eyes:  Conjunctivae are normal. Pupils are equal, round, and reactive to light.  Neck: Neck supple. No tracheal deviation present. No thyromegaly present.  Cardiovascular: Normal rate and regular rhythm.   No murmur heard. Pulmonary/Chest: Effort normal and breath sounds normal.  Abdominal: Soft. Bowel sounds are normal. He exhibits no distension. There is no tenderness.  Musculoskeletal: Normal range of motion. He exhibits edema. He exhibits no tenderness.  3+ pitting pretibial edema bilaterally  Neurological: He is alert and oriented to person, place, and time. No cranial nerve deficit. Coordination normal.  No asterixis walks with cane without difficulty  Skin: Skin is warm and dry. No rash noted.  Psychiatric: He has a normal mood and affect.  Nursing note and vitals reviewed.   ED Course  Procedures (including critical care time) Labs Review Labs Reviewed - No data to display  Imaging Review No results found.   EKG Interpretation None  Patient was sent to ultrasound suite for ultrasound guided paracentesis. 3.8 L of serous fluid was drained from his abdominal cavity. At 11:30 AM patient feels well he's not lightheaded on standing his breathing is improved he feels ready to go home Results for orders placed or performed during the hospital encounter of 10/31/14  Comprehensive metabolic panel  Result Value Ref Range   Sodium 128 (L) 135 - 145 mmol/L   Potassium 4.6 3.5 - 5.1 mmol/L   Chloride 100 (L) 101 - 111 mmol/L   CO2 21 (L) 22 - 32 mmol/L   Glucose, Bld 139 (H) 65 - 99 mg/dL   BUN 23 (H) 6 - 20 mg/dL   Creatinine, Ser 1.27 (H) 0.61 - 1.24 mg/dL   Calcium 8.0 (L) 8.9 - 10.3 mg/dL   Total Protein 5.9 (L) 6.5 - 8.1 g/dL   Albumin 2.6 (L) 3.5 - 5.0 g/dL   AST 59 (H) 15 - 41 U/L   ALT 33 17 - 63 U/L   Alkaline Phosphatase 109 38 - 126 U/L   Total Bilirubin 2.6 (H) 0.3 - 1.2 mg/dL   GFR calc non Af Amer >60 >60 mL/min   GFR calc Af Amer >60 >60 mL/min   Anion gap 7 5 -  15  CBC with Differential/Platelet  Result Value Ref Range   WBC 4.5 4.0 - 10.5 K/uL   RBC 2.91 (L) 4.22 - 5.81 MIL/uL   Hemoglobin 9.3 (L) 13.0 - 17.0 g/dL   HCT 27.2 (L) 39.0 - 52.0 %   MCV 93.5 78.0 - 100.0 fL   MCH 32.0 26.0 - 34.0 pg   MCHC 34.2 30.0 - 36.0 g/dL   RDW 19.8 (H) 11.5 - 15.5 %   Platelets 65 (L) 150 - 400 K/uL   Neutrophils Relative % 65 43 - 77 %   Neutro Abs 2.9 1.7 - 7.7 K/uL   Lymphocytes Relative 18 12 - 46 %   Lymphs Abs 0.8 0.7 - 4.0 K/uL   Monocytes Relative 11 3 - 12 %   Monocytes Absolute 0.5 0.1 - 1.0 K/uL   Eosinophils Relative 6 (H) 0 - 5 %   Eosinophils Absolute 0.3 0.0 - 0.7 K/uL   Basophils Relative 0 0 - 1 %   Basophils Absolute 0.0 0.0 - 0.1 K/uL  Protime-INR  Result Value Ref Range   Prothrombin Time 18.4 (H) 11.6 - 15.2 seconds   INR 1.53 (H) 0.00 - 1.49   US Abdomen Limited  10/08/2014   CLINICAL DATA:  Patient with cirrhosis and recurrent ascites. Request evaluation for reaccumulation of ascites and possible paracentesis.  EXAM: LIMITED ABDOMEN ULTRASOUND FOR ASCITES  TECHNIQUE: Limited ultrasound survey for ascites was performed in all four abdominal quadrants.  COMPARISON:  Previous paracentesis 10/06/2014 and 10/03/2014  FINDINGS: Limited abdominal ultrasound finds evidence of reaccumulation of ascites, however not as large volume as previously demonstrated. There is small volume of paracentesis in all 4 quadrants however this is not amenable for therapeutic paracentesis at this time.  IMPRESSION: Recurrent ascites. Paracentesis not performed. Recommend following patient clinically and can consider therapeutic paracentesis when symptomatically necessary.  Read by: Ascencion Dike PA-C   Electronically Signed   By: Sandi Mariscal M.D.   On: 10/08/2014 14:18   US Paracentesis  10/15/2014   CLINICAL DATA:  ascites  EXAM: ULTRASOUND GUIDED  PARACENTESIS  COMPARISON:  None.  PROCEDURE: An ultrasound guided paracentesis was thoroughly discussed with the  patient and questions answered. The benefits, risks,  alternatives and complications were also discussed. The patient understands and wishes to proceed with the procedure. Written consent was obtained.  Ultrasound was performed to localize and mark an adequate pocket of fluid in the right quadrant of the abdomen. The area was then prepped and draped in the normal sterile fashion. 1% Lidocaine was used for local anesthesia. Under ultrasound guidance a 19 gauge Yueh catheter was introduced. Paracentesis was performed. The catheter was removed and a dressing applied.  COMPLICATIONS: None.  FINDINGS: A total of approximately 3.4 Liters of clear yellow fluid was removed. A fluid sample was not sent for laboratory analysis.  IMPRESSION: Successful ultrasound guided paracentesis yielding 3.4 liters of ascites.  Read by:  Gareth Eagle, PA-C   Electronically Signed   By: Aletta Edouard M.D.   On: 10/15/2014 15:29   US Paracentesis  10/06/2014   INDICATION: Cirrhosis, recurrent ascites and request for paracentesis.  EXAM: ULTRASOUND-GUIDED PARACENTESIS  COMPARISON:  Paracentesis 10/03/14.  MEDICATIONS: None.  COMPLICATIONS: None immediate  TECHNIQUE: Informed written consent was obtained from the patient after a discussion of the risks, benefits and alternatives to treatment. A timeout was performed prior to the initiation of the procedure.  Initial ultrasound scanning demonstrates a large amount of ascites within the left lower abdominal quadrant. The left lower abdomen was prepped and draped in the usual sterile fashion. 1% lidocaine was used for local anesthesia.  An ultrasound image was saved for documentation purposed. A 6 Fr Safe-T-Centesis catheter was introduced. The paracentesis was performed. The catheter was removed and a dressing was applied. The patient tolerated the procedure well without immediate post procedural complication.  FINDINGS: A total of approximately 4.5 liters of serous fluid was removed.   IMPRESSION: Successful ultrasound-guided paracentesis yielding 4.5 liters of peritoneal fluid.  Read By:  Tsosie Billing PA-C   Electronically Signed   By: Markus Daft M.D.   On: 10/06/2014 11:31   US Paracentesis  10/03/2014   INDICATION: Cirrhosis, recurrent ascites and request for paracentesis.  EXAM: ULTRASOUND-GUIDED PARACENTESIS  COMPARISON:  US Paracentesis 09/27/14.  MEDICATIONS: None.  COMPLICATIONS: None immediate  TECHNIQUE: Informed written consent was obtained from the patient after a discussion of the risks, benefits and alternatives to treatment. A timeout was performed prior to the initiation of the procedure.  Initial ultrasound scanning demonstrates a large amount of ascites within the left lower abdominal quadrant. The left lower abdomen was prepped and draped in the usual sterile fashion. 1% lidocaine was used for local anesthesia.  An ultrasound image was saved for documentation purposed. A 6 Fr Safe-T-Centesis catheter was introduced. The paracentesis was performed. The catheter was removed and a dressing was applied. The patient tolerated the procedure well without immediate post procedural complication.  FINDINGS: A total of approximately 5.5 liters of serous fluid was removed.  IMPRESSION: Successful ultrasound-guided paracentesis yielding 5.5 liters of peritoneal fluid.  Read By:  Tsosie Billing PA-C   Electronically Signed   By: Marybelle Killings M.D.   On: 10/03/2014 12:26    MDM  I suggest the patient follow up with Dr. Jonelle Sidle within a week. To have recheck of blood chemistries Final diagnoses:  None   diagnosis #1acites #2 hyponatremia #3 hyperglycemia #4 renal insufficiency #5 anemia #6 thrombocytopenia #7 hypocalcemia      Orlie Dakin, MD 10/31/14 1135

## 2014-10-31 NOTE — ED Notes (Signed)
Pt reports abd swelling and fluid overload, causing sob. Hx of same and had fluid drained from abd. Airway intact.

## 2014-11-04 ENCOUNTER — Encounter (HOSPITAL_COMMUNITY): Payer: Self-pay | Admitting: *Deleted

## 2014-11-04 DIAGNOSIS — R011 Cardiac murmur, unspecified: Secondary | ICD-10-CM | POA: Insufficient documentation

## 2014-11-04 DIAGNOSIS — J449 Chronic obstructive pulmonary disease, unspecified: Secondary | ICD-10-CM | POA: Insufficient documentation

## 2014-11-04 DIAGNOSIS — R188 Other ascites: Secondary | ICD-10-CM | POA: Diagnosis present

## 2014-11-04 DIAGNOSIS — Z8673 Personal history of transient ischemic attack (TIA), and cerebral infarction without residual deficits: Secondary | ICD-10-CM | POA: Insufficient documentation

## 2014-11-04 DIAGNOSIS — G8929 Other chronic pain: Secondary | ICD-10-CM | POA: Diagnosis not present

## 2014-11-04 DIAGNOSIS — D649 Anemia, unspecified: Secondary | ICD-10-CM | POA: Insufficient documentation

## 2014-11-04 DIAGNOSIS — I10 Essential (primary) hypertension: Secondary | ICD-10-CM | POA: Diagnosis not present

## 2014-11-04 DIAGNOSIS — R140 Abdominal distension (gaseous): Secondary | ICD-10-CM | POA: Insufficient documentation

## 2014-11-04 DIAGNOSIS — E119 Type 2 diabetes mellitus without complications: Secondary | ICD-10-CM | POA: Diagnosis not present

## 2014-11-04 DIAGNOSIS — K210 Gastro-esophageal reflux disease with esophagitis: Secondary | ICD-10-CM | POA: Diagnosis not present

## 2014-11-04 DIAGNOSIS — Z720 Tobacco use: Secondary | ICD-10-CM | POA: Diagnosis not present

## 2014-11-04 DIAGNOSIS — F319 Bipolar disorder, unspecified: Secondary | ICD-10-CM | POA: Insufficient documentation

## 2014-11-04 DIAGNOSIS — Z794 Long term (current) use of insulin: Secondary | ICD-10-CM | POA: Insufficient documentation

## 2014-11-04 DIAGNOSIS — Z8739 Personal history of other diseases of the musculoskeletal system and connective tissue: Secondary | ICD-10-CM | POA: Insufficient documentation

## 2014-11-04 DIAGNOSIS — Z79899 Other long term (current) drug therapy: Secondary | ICD-10-CM | POA: Diagnosis not present

## 2014-11-04 DIAGNOSIS — T8189XA Other complications of procedures, not elsewhere classified, initial encounter: Secondary | ICD-10-CM | POA: Diagnosis not present

## 2014-11-04 DIAGNOSIS — Y844 Aspiration of fluid as the cause of abnormal reaction of the patient, or of later complication, without mention of misadventure at the time of the procedure: Secondary | ICD-10-CM | POA: Insufficient documentation

## 2014-11-04 NOTE — ED Notes (Signed)
Pt in c/o fluid leaking from the site where he had a paracentesis on 6/10, pt was seen here for ascites, states he has abdominal distention again, denies fever, no distress noted

## 2014-11-05 ENCOUNTER — Emergency Department (HOSPITAL_COMMUNITY)
Admission: EM | Admit: 2014-11-05 | Discharge: 2014-11-05 | Disposition: A | Discharge status: Home / Self Care | Payer: Medicare PPO | Attending: Emergency Medicine | Admitting: Emergency Medicine

## 2014-11-05 DIAGNOSIS — T819XXA Unspecified complication of procedure, initial encounter: Secondary | ICD-10-CM

## 2014-11-05 NOTE — ED Notes (Signed)
Claudine Mouton, MD at bedside.

## 2014-11-05 NOTE — Discharge Instructions (Signed)
Paracentesis Mr. Chadderdon, your leaking was stopped with skin glue.  Keep the area dry for 24hours.  See your primary doctor within 3 days for close follow up.  If symptoms worsen, come back to the ED immediately.  Thank you. Paracentesis is a procedure used to remove excess fluid from the belly (abdomen). Excess fluid in the belly is called ascites. Excess fluid can be the result of certain conditions, such as infection, inflammation, abdominal injury, heart failure, chronic scarring of the liver (cirrhosis), or cancer. The excess fluid is removed using a needle inserted through the skin and tissue into the abdomen.  A paracentesis may be done to:  Determine the cause of the excess fluid through examination of the fluid.  Relieve symptoms of shortness of breath or pain caused by the excess fluid.  Determine presence of bleeding after an abdominal injury. LET YOUR CAREGIVERS KNOW ABOUT:  Allergies.  Medications taken including herbs, eye drops, over-the-counter medications, and creams.  Use of steroids (by mouth or creams).  Previous problems with anesthetics or numbing medicine.  Possibility of pregnancy, if this applies.  History of blood clots (thrombophlebitis).  History of bleeding or blood problems.  Previous surgery.  Other health problems. RISKS AND COMPLICATIONS  Injury to an abdominal organ, such as the bowel (large intestine), liver, spleen, or bladder.  Possible infection.  Bleeding.  Low blood pressure (hypotension). BEFORE THE PROCEDURE This is a procedure that can be done as an outpatient. Confirm the time that you need to arrive for your procedure. A blood sample may be done to determine your blood clotting time. The presence of a severe bleeding disorder (coagulopathy) which cannot be promptly corrected may make this procedure inadvisable. You may be asked to urinate. PROCEDURE The procedure will take about 30 minutes. This time will vary depending on the  amount of fluid that is removed. You may be asked to lie on your back with your head elevated. An area on your abdomen will be cleansed. A numbing medicine may then be injected (local anesthesia) into the skin and tissue. A needle is inserted through your abdominal skin and tissues until it is positioned in your abdomen. You may feel pressure or slight pain as the needle is positioned into the abdomen. Fluid is removed from the abdomen through the needle. Tell your caregiver if you feel dizzy or lightheaded. The needle is withdrawn once the desired amount of fluid has been removed. A sample of the fluid may be sent for examination.  AFTER THE PROCEDURE Your recovery will be assessed and monitored. If there are no problems, as an outpatient, you should be able to go home shortly after the procedure. There may be a very limited amount of clear fluid draining from the needle insertion site over the next 2 days. Confirm with your caregiver as to the expected amount of drainage. Obtaining the Test Results It is your responsibility to obtain your test results. Do not assume everything is normal if you have not heard from your caregiver or the medical facility. It is important for you to follow up on all of your test results. HOME CARE INSTRUCTIONS   You may resume normal diet and activities as directed or allowed.  Only take over-the-counter or prescription medicines for pain, discomfort, or fever as directed by your caregiver. SEEK IMMEDIATE MEDICAL CARE IF:  You develop shortness of breath or chest pain.  You develop increasing pain, discomfort, or swelling in your abdomen.  You develop new drainage or  pus coming from site where fluid was removed.  You develop swelling or increased redness from site where fluid was removed.  You develop an unexplained temperature of 102 F (38.9 C) or above. Document Released: 11/22/2004 Document Revised: 08/01/2011 Document Reviewed: 12/29/2008 [facility] Patient  Information 2015 [place], [place]. This information is not intended to replace advice given to you by your health care provider. Make sure you discuss any questions you have with your health care provider.

## 2014-11-05 NOTE — ED Provider Notes (Signed)
CSN: [id number]     Arrival date & time 11/04/14  2246 History  This chart was scribed for Darrell Balls, MD by [name], ED Scribe. This patient was seen in room D31C/D31C and the patient's care was started at 2:30 AM.   Chief Complaint  Patient presents with  . Ascites    The history is provided by the patient. No language interpreter was used.     HPI Comments: [name] is a 59 y.o. male who presents to the Emergency Department complaining of persistent leakage of fluid from the LLQ of abdomen for the past 5 days. Patient was seen here 5 days ago for ascites and subsequently had a paracentesis. He reports leakage from the site since with associated abdominal distention. He denies abdominal pain, vomiting, or diarrhea.   Past Medical History  Diagnosis Date  . Neuropathy   . Diabetes mellitus   . Bipolar affect, depressed   . Hypertension   . Arthritis   . Stroke     Mini stroke about 47yrs ago  . Cirrhosis   . Alcohol abuse   . Chronic pain   . Cocaine abuse   . Muscle spasm     both legs  . Encephalopathy, hepatic   . Detached retina   . COPD (chronic obstructive pulmonary disease)     emphysema  . Bronchitis   . Barrett's esophagus   . GERD (gastroesophageal reflux disease)     has ulcer  . Anemia    Past Surgical History  Procedure Laterality Date  . Fracture surgery      Leg and arm 36yrs ago  . Esophagogastroduodenoscopy  04/04/2012    Procedure: ESOPHAGOGASTRODUODENOSCOPY (EGD);  Surgeon: Irene Shipper, MD;  Location: [facility] ENDOSCOPY;  Service: Endoscopy;  Laterality: N/A;  . Esophagogastroduodenoscopy Left 03/13/2013    Procedure: ESOPHAGOGASTRODUODENOSCOPY (EGD);  Surgeon: Arta Silence, MD;  Location: [facility] ENDOSCOPY;  Service: Endoscopy;  Laterality: Left;  Marland Kitchen Eye surgery  8 months ago both eyes    cataracts both eyes, detached eye, gas pocket  . Vasectomy    . Pars plana vitrectomy Left 07/08/2013    Procedure: PARS PLANA VITRECTOMY WITH 25 GAUGE;   Surgeon: Hurman Horn, MD;  Location: [place];  Service: Ophthalmology;  Laterality: Left;  . Intraocular lens removal Left 07/08/2013    Procedure: REMOVAL OF INTRAOCULAR LENS;  Surgeon: Hurman Horn, MD;  Location: [place];  Service: Ophthalmology;  Laterality: Left;  . Placement and suture of secondary intraocular lens Left 07/08/2013    Procedure: PLACEMENT AND SUTURE OF SECONDARY INTRAOCULAR LENS;  Surgeon: Hurman Horn, MD;  Location: [place];  Service: Ophthalmology;  Laterality: Left;  Insertion of Anterior Capsule Intraocular Lens   . Esophagogastroduodenoscopy N/A 01/16/2014    Procedure: ESOPHAGOGASTRODUODENOSCOPY (EGD);  Surgeon: Winfield Cunas., MD;  Location: [facility] ENDOSCOPY;  Service: Endoscopy;  Laterality: N/A;  . Colonoscopy N/A 01/17/2014    Procedure: COLONOSCOPY;  Surgeon: Winfield Cunas., MD;  Location: [facility] ENDOSCOPY;  Service: Endoscopy;  Laterality: N/A;  possible banding  . Esophagogastroduodenoscopy N/A 08/30/2014    Procedure: ESOPHAGOGASTRODUODENOSCOPY (EGD);  Surgeon: Wilford Corner, MD;  Location: [facility] ENDOSCOPY;  Service: Endoscopy;  Laterality: N/A;  bedside   Family History  Problem Relation Age of Onset  . Hypotension Mother    History  Substance Use Topics  . Smoking status: Current Every Day Smoker -- 1.00 packs/day for 30 years    Types: Cigarettes  . Smokeless tobacco:  Never Used  . Alcohol Use: 0.0 oz/week     Comment: 12 pk beer daily  06/2013 - no alcohol since 11/2012    Review of Systems  A complete 10 system review of systems was obtained and all systems are negative except as noted in the HPI and PMH.    Allergies  Review of patient's allergies indicates no known allergies.  Home Medications   Prior to Admission medications   Medication Sig Start Date End Date Taking? Authorizing Provider  amitriptyline (ELAVIL) 25 MG tablet Take 1 tablet (25 mg total) by mouth at bedtime. 10/05/14   Nita Sells, MD  CALCIUM-VITAMIN D PO Take 100  Units by mouth daily.    Historical Provider, MD  clonazePAM (KLONOPIN) 0.25 MG disintegrating tablet Take 0.25 mg by mouth 2 (two) times daily.  10/17/14   Historical Provider, MD  clonazePAM (KLONOPIN) 0.5 MG tablet Take 0.5 tablets (0.25 mg total) by mouth 2 (two) times daily. 10/06/14   Nita Sells, MD  diazepam (VALIUM) 5 MG tablet TAKE 1 TABLET TWICE A DAY 10/17/14   Historical Provider, MD  diclofenac sodium (VOLTAREN) 1 % GEL Apply 4 g topically as needed (for back of leg pain).    Historical Provider, MD  feeding supplement, GLUCERNA SHAKE, (GLUCERNA SHAKE) LIQD Take 237 mLs by mouth 2 (two) times daily between meals. Patient not taking: Reported on 10/31/2014 10/06/14   Nita Sells, MD  folic acid (FOLVITE) 1 MG tablet Take 1 tablet (1 mg total) by mouth daily. 10/05/14   Nita Sells, MD  furosemide (LASIX) 40 MG tablet Take 40 mg by mouth daily.  10/25/14   Historical Provider, MD  furosemide (LASIX) 80 MG tablet Take 1 tablet (80 mg total) by mouth daily. Patient not taking: Reported on 10/31/2014 10/06/14   Nita Sells, MD  insulin aspart (NOVOLOG FLEXPEN) 100 UNIT/ML FlexPen Inject 6 Units into the skin 3 (three) times daily with meals. For diabetes 02/19/14   Encarnacion Slates, NP  insulin glargine (LANTUS) 100 UNIT/ML injection Inject 45 Units into the skin at bedtime.    Historical Provider, MD  lactulose (CHRONULAC) 10 GM/15ML solution Take 45 mLs (30 g total) by mouth 3 (three) times daily. Patient taking differently: Take 20 g by mouth 3 (three) times daily.  09/03/14   Reyne Dumas, MD  lisinopril (PRINIVIL,ZESTRIL) 20 MG tablet Take 20 mg by mouth daily.  08/21/14   Historical Provider, MD  magnesium oxide (MAG-OX) 400 (241.3 MG) MG tablet Take 1 tablet (400 mg total) by mouth 2 (two) times daily. 09/03/14   Reyne Dumas, MD  Multiple Vitamin (MULTIVITAMIN WITH MINERALS) TABS tablet Take 1 tablet by mouth daily. 07/31/14   Geradine Girt, DO  oxyCODONE (OXY  IR/ROXICODONE) 5 MG immediate release tablet Take 5 mg by mouth every 8 (eight) hours. 09/24/14   Historical Provider, MD  pantoprazole (PROTONIX) 40 MG tablet Take 40 mg by mouth daily.  07/09/14   Historical Provider, MD  propranolol (INDERAL) 20 MG tablet Take 20 mg by mouth 2 (two) times daily. 09/24/14   Historical Provider, MD  spironolactone (ALDACTONE) 100 MG tablet Take 2 tablets (200 mg total) by mouth daily. Patient not taking: Reported on 10/31/2014 10/06/14   Nita Sells, MD  spironolactone (ALDACTONE) 25 MG tablet Take 25 mg by mouth 3 (three) times daily.  10/27/14   Historical Provider, MD  thiamine 100 MG tablet Take 1 tablet (100 mg total) by mouth daily. Patient not taking: Reported on  10/31/2014 10/05/14   Nita Sells, MD  XIFAXAN 550 MG TABS tablet Take 550 mg by mouth 2 (two) times daily.  10/29/14   Historical Provider, MD   Triage Vitals: BP 127/59 mmHg  Pulse 94  Temp(Src) 98 F (36.7 C) (Oral)  Resp 18  SpO2 99%  Physical Exam  Constitutional: He is oriented to person, place, and time. Vital signs are normal. He appears well-developed and well-nourished.  Non-toxic appearance. He does not appear ill. No distress.  HENT:  Head: Normocephalic and atraumatic.  Nose: Nose normal.  Mouth/Throat: Oropharynx is clear and moist. No oropharyngeal exudate.  Eyes: Conjunctivae and EOM are normal. Pupils are equal, round, and reactive to light. No scleral icterus.  Neck: Normal range of motion. Neck supple. No tracheal deviation, no edema, no erythema and normal range of motion present. No thyroid mass and no thyromegaly present.  Cardiovascular: Normal rate, regular rhythm, S1 normal, S2 normal, intact distal pulses and normal pulses.  Exam reveals no gallop and no friction rub.   Murmur heard. Pulses:      Radial pulses are 2+ on the right side, and 2+ on the left side.       Dorsalis pedis pulses are 2+ on the right side, and 2+ on the left side.  Systolic murmur.   Pulmonary/Chest: Effort normal and breath sounds normal. No respiratory distress. He has no wheezes. He has no rhonchi. He has no rales.  Abdominal: Soft. Normal appearance and bowel sounds are normal. He exhibits distension. He exhibits no ascites and no mass. There is no hepatosplenomegaly. There is no tenderness. There is no rebound, no guarding and no CVA tenderness.  Abdominal distention and leaking paracentesis site in LLQ.  Musculoskeletal: Normal range of motion. He exhibits edema. He exhibits no tenderness.  Bilateral lower extremity edema.  Lymphadenopathy:    He has no cervical adenopathy.  Neurological: He is alert and oriented to person, place, and time. He has normal strength. No cranial nerve deficit or sensory deficit.  Baseline tremors.  Skin: Skin is warm, dry and intact. No petechiae and no rash noted. He is not diaphoretic. No erythema. No pallor.  Psychiatric: He has a normal mood and affect. His behavior is normal. Judgment normal.  Nursing note and vitals reviewed.   ED Course  Procedures (including critical care time)  DIAGNOSTIC STUDIES: Oxygen Saturation is 99% on room air, normal by my interpretation.    COORDINATION OF CARE: At 0233 Discussed treatment plan with patient which includes Dermabond. Patient agrees.   Labs Review Labs Reviewed - No data to display  Imaging Review No results found.   EKG Interpretation None      MDM   Final diagnoses:  None    Patient since emergency department for leaking around his paracentesis site. I applied Dermabond to the area in the leaking has stopped. He has no further complaints. Physical exam is otherwise unremarkable and at his baseline. He appears comfortable in no acute distress. His vital signs remain within his normal limits and he is safe for discharge.  I personally performed the services described in this documentation, which was scribed in my presence. The recorded information has been reviewed  and is accurate.   Darrell Balls, MD 11/05/14 [phone]

## 2014-11-05 NOTE — ED Notes (Signed)
Patient is resting comfortably. 

## 2014-11-10 ENCOUNTER — Emergency Department (HOSPITAL_COMMUNITY): Payer: Medicare PPO

## 2014-11-10 ENCOUNTER — Inpatient Hospital Stay (HOSPITAL_COMMUNITY)
Admission: EM | Admit: 2014-11-10 | Discharge: 2014-11-13 | DRG: 442 | Disposition: A | Discharge status: Home / Self Care | Payer: Medicare PPO | Attending: Internal Medicine | Admitting: Internal Medicine

## 2014-11-10 ENCOUNTER — Encounter (HOSPITAL_COMMUNITY): Payer: Self-pay | Admitting: Emergency Medicine

## 2014-11-10 DIAGNOSIS — G9340 Encephalopathy, unspecified: Secondary | ICD-10-CM | POA: Diagnosis present

## 2014-11-10 DIAGNOSIS — Z4659 Encounter for fitting and adjustment of other gastrointestinal appliance and device: Secondary | ICD-10-CM

## 2014-11-10 DIAGNOSIS — D649 Anemia, unspecified: Secondary | ICD-10-CM | POA: Diagnosis present

## 2014-11-10 DIAGNOSIS — E119 Type 2 diabetes mellitus without complications: Secondary | ICD-10-CM

## 2014-11-10 DIAGNOSIS — Z9119 Patient's noncompliance with other medical treatment and regimen: Secondary | ICD-10-CM | POA: Diagnosis present

## 2014-11-10 DIAGNOSIS — E1165 Type 2 diabetes mellitus with hyperglycemia: Secondary | ICD-10-CM | POA: Diagnosis present

## 2014-11-10 DIAGNOSIS — F17210 Nicotine dependence, cigarettes, uncomplicated: Secondary | ICD-10-CM | POA: Diagnosis present

## 2014-11-10 DIAGNOSIS — K219 Gastro-esophageal reflux disease without esophagitis: Secondary | ICD-10-CM | POA: Diagnosis present

## 2014-11-10 DIAGNOSIS — F314 Bipolar disorder, current episode depressed, severe, without psychotic features: Secondary | ICD-10-CM | POA: Diagnosis not present

## 2014-11-10 DIAGNOSIS — K7290 Hepatic failure, unspecified without coma: Secondary | ICD-10-CM | POA: Diagnosis present

## 2014-11-10 DIAGNOSIS — F1010 Alcohol abuse, uncomplicated: Secondary | ICD-10-CM | POA: Diagnosis present

## 2014-11-10 DIAGNOSIS — Z794 Long term (current) use of insulin: Secondary | ICD-10-CM | POA: Diagnosis not present

## 2014-11-10 DIAGNOSIS — E875 Hyperkalemia: Secondary | ICD-10-CM | POA: Diagnosis present

## 2014-11-10 DIAGNOSIS — F319 Bipolar disorder, unspecified: Secondary | ICD-10-CM | POA: Diagnosis present

## 2014-11-10 DIAGNOSIS — E1140 Type 2 diabetes mellitus with diabetic neuropathy, unspecified: Secondary | ICD-10-CM | POA: Diagnosis present

## 2014-11-10 DIAGNOSIS — I5032 Chronic diastolic (congestive) heart failure: Secondary | ICD-10-CM | POA: Diagnosis present

## 2014-11-10 DIAGNOSIS — F3130 Bipolar disorder, current episode depressed, mild or moderate severity, unspecified: Secondary | ICD-10-CM | POA: Diagnosis present

## 2014-11-10 DIAGNOSIS — I10 Essential (primary) hypertension: Secondary | ICD-10-CM | POA: Diagnosis present

## 2014-11-10 DIAGNOSIS — Z8673 Personal history of transient ischemic attack (TIA), and cerebral infarction without residual deficits: Secondary | ICD-10-CM

## 2014-11-10 DIAGNOSIS — R188 Other ascites: Secondary | ICD-10-CM

## 2014-11-10 DIAGNOSIS — K7031 Alcoholic cirrhosis of liver with ascites: Secondary | ICD-10-CM | POA: Diagnosis present

## 2014-11-10 DIAGNOSIS — D696 Thrombocytopenia, unspecified: Secondary | ICD-10-CM | POA: Diagnosis present

## 2014-11-10 DIAGNOSIS — J449 Chronic obstructive pulmonary disease, unspecified: Secondary | ICD-10-CM | POA: Diagnosis present

## 2014-11-10 DIAGNOSIS — Z9114 Patient's other noncompliance with medication regimen: Secondary | ICD-10-CM | POA: Diagnosis present

## 2014-11-10 DIAGNOSIS — K7682 Hepatic encephalopathy: Secondary | ICD-10-CM | POA: Diagnosis present

## 2014-11-10 DIAGNOSIS — R4182 Altered mental status, unspecified: Secondary | ICD-10-CM | POA: Diagnosis not present

## 2014-11-10 DIAGNOSIS — R404 Transient alteration of awareness: Secondary | ICD-10-CM

## 2014-11-10 DIAGNOSIS — E139 Other specified diabetes mellitus without complications: Secondary | ICD-10-CM | POA: Diagnosis not present

## 2014-11-10 LAB — COMPREHENSIVE METABOLIC PANEL
ALT: 52 U/L (ref 17–63)
AST: 96 U/L — AB (ref 15–41)
Albumin: 2.5 g/dL — ABNORMAL LOW (ref 3.5–5.0)
Alkaline Phosphatase: 121 U/L (ref 38–126)
Anion gap: 6 (ref 5–15)
BILIRUBIN TOTAL: 2.8 mg/dL — AB (ref 0.3–1.2)
BUN: 22 mg/dL — AB (ref 6–20)
CALCIUM: 8.1 mg/dL — AB (ref 8.9–10.3)
CHLORIDE: 103 mmol/L (ref 101–111)
CO2: 21 mmol/L — AB (ref 22–32)
CREATININE: 1.12 mg/dL (ref 0.61–1.24)
GLUCOSE: 98 mg/dL (ref 65–99)
Potassium: 5.6 mmol/L — ABNORMAL HIGH (ref 3.5–5.1)
Sodium: 130 mmol/L — ABNORMAL LOW (ref 135–145)
Total Protein: 6.1 g/dL — ABNORMAL LOW (ref 6.5–8.1)

## 2014-11-10 LAB — CBC
HEMATOCRIT: 27.2 % — AB (ref 39.0–52.0)
HEMOGLOBIN: 9.5 g/dL — AB (ref 13.0–17.0)
MCH: 32.4 pg (ref 26.0–34.0)
MCHC: 34.9 g/dL (ref 30.0–36.0)
MCV: 92.8 fL (ref 78.0–100.0)
Platelets: 64 10*3/uL — ABNORMAL LOW (ref 150–400)
RBC: 2.93 MIL/uL — AB (ref 4.22–5.81)
RDW: 19.5 % — ABNORMAL HIGH (ref 11.5–15.5)
WBC: 3.7 10*3/uL — AB (ref 4.0–10.5)

## 2014-11-10 LAB — URINALYSIS, ROUTINE W REFLEX MICROSCOPIC
Bilirubin Urine: NEGATIVE
Glucose, UA: NEGATIVE mg/dL
Hgb urine dipstick: NEGATIVE
Ketones, ur: NEGATIVE mg/dL
LEUKOCYTES UA: NEGATIVE
NITRITE: NEGATIVE
Protein, ur: NEGATIVE mg/dL
SPECIFIC GRAVITY, URINE: 1.015 (ref 1.005–1.030)
Urobilinogen, UA: 0.2 mg/dL (ref 0.0–1.0)
pH: 6 (ref 5.0–8.0)

## 2014-11-10 LAB — I-STAT VENOUS BLOOD GAS, ED
Acid-base deficit: 3 mmol/L — ABNORMAL HIGH (ref 0.0–2.0)
BICARBONATE: 20.5 meq/L (ref 20.0–24.0)
O2 Saturation: 94 %
PH VEN: 7.441 — AB (ref 7.250–7.300)
TCO2: 21 mmol/L (ref 0–100)
pCO2, Ven: 30.1 mmHg — ABNORMAL LOW (ref 45.0–50.0)
pO2, Ven: 67 mmHg — ABNORMAL HIGH (ref 30.0–45.0)

## 2014-11-10 LAB — GLUCOSE, CAPILLARY
GLUCOSE-CAPILLARY: 125 mg/dL — AB (ref 65–99)
GLUCOSE-CAPILLARY: 120 mg/dL — AB (ref 65–99)

## 2014-11-10 LAB — MRSA PCR SCREENING: MRSA BY PCR: NEGATIVE

## 2014-11-10 LAB — RAPID URINE DRUG SCREEN, HOSP PERFORMED
Amphetamines: NOT DETECTED
Barbiturates: NOT DETECTED
Benzodiazepines: POSITIVE — AB
COCAINE: NOT DETECTED
OPIATES: NOT DETECTED
Tetrahydrocannabinol: NOT DETECTED

## 2014-11-10 LAB — AMMONIA: AMMONIA: 228 umol/L — AB (ref 9–35)

## 2014-11-10 LAB — CBG MONITORING, ED: Glucose-Capillary: 106 mg/dL — ABNORMAL HIGH (ref 65–99)

## 2014-11-10 LAB — ETHANOL

## 2014-11-10 LAB — PROTIME-INR
INR: 1.59 — ABNORMAL HIGH (ref 0.00–1.49)
PROTHROMBIN TIME: 19 s — AB (ref 11.6–15.2)

## 2014-11-10 MED ORDER — SODIUM POLYSTYRENE SULFONATE 15 GM/60ML PO SUSP
30.0000 g | Freq: Once | ORAL | Status: AC
  Administered 2014-11-10: 30 g
  Filled 2014-11-10: qty 120

## 2014-11-10 MED ORDER — THIAMINE HCL 100 MG/ML IJ SOLN
100.0000 mg | Freq: Once | INTRAMUSCULAR | Status: AC
  Administered 2014-11-10: 100 mg via INTRAVENOUS
  Filled 2014-11-10: qty 2

## 2014-11-10 MED ORDER — RIFAXIMIN 550 MG PO TABS
550.0000 mg | ORAL_TABLET | Freq: Two times a day (BID) | ORAL | Status: DC
  Administered 2014-11-10 – 2014-11-13 (×6): 550 mg
  Filled 2014-11-10 (×7): qty 1

## 2014-11-10 MED ORDER — NALOXONE HCL 0.4 MG/ML IJ SOLN
0.4000 mg | Freq: Once | INTRAMUSCULAR | Status: AC
  Administered 2014-11-10: 0.4 mg via INTRAVENOUS
  Filled 2014-11-10: qty 1

## 2014-11-10 MED ORDER — LACTULOSE 10 GM/15ML PO SOLN
30.0000 g | Freq: Once | ORAL | Status: AC
  Administered 2014-11-10: 30 g
  Filled 2014-11-10: qty 45

## 2014-11-10 MED ORDER — LACTULOSE 10 GM/15ML PO SOLN
30.0000 g | Freq: Three times a day (TID) | ORAL | Status: DC
  Administered 2014-11-10 – 2014-11-13 (×8): 30 g
  Filled 2014-11-10 (×12): qty 45

## 2014-11-10 MED ORDER — CETYLPYRIDINIUM CHLORIDE 0.05 % MT LIQD
7.0000 mL | Freq: Two times a day (BID) | OROMUCOSAL | Status: DC
  Administered 2014-11-10 – 2014-11-12 (×5): 7 mL via OROMUCOSAL

## 2014-11-10 MED ORDER — INSULIN GLARGINE 100 UNIT/ML ~~LOC~~ SOLN
30.0000 [IU] | Freq: Every day | SUBCUTANEOUS | Status: DC
  Administered 2014-11-10 – 2014-11-12 (×3): 30 [IU] via SUBCUTANEOUS
  Filled 2014-11-10 (×4): qty 0.3

## 2014-11-10 MED ORDER — INSULIN ASPART 100 UNIT/ML ~~LOC~~ SOLN
0.0000 [IU] | Freq: Three times a day (TID) | SUBCUTANEOUS | Status: DC
  Administered 2014-11-10: 1 [IU] via SUBCUTANEOUS
  Administered 2014-11-12: 2 [IU] via SUBCUTANEOUS
  Administered 2014-11-12: 1 [IU] via SUBCUTANEOUS

## 2014-11-10 MED ORDER — PANTOPRAZOLE SODIUM 40 MG IV SOLR
40.0000 mg | INTRAVENOUS | Status: DC
  Administered 2014-11-10 – 2014-11-11 (×2): 40 mg via INTRAVENOUS
  Filled 2014-11-10 (×3): qty 40

## 2014-11-10 MED ORDER — PROPRANOLOL HCL 20 MG PO TABS
20.0000 mg | ORAL_TABLET | Freq: Two times a day (BID) | ORAL | Status: DC
  Administered 2014-11-10 – 2014-11-12 (×3): 20 mg via NASOGASTRIC
  Filled 2014-11-10 (×7): qty 1

## 2014-11-10 NOTE — ED Notes (Signed)
MD at bedside. 

## 2014-11-10 NOTE — ED Provider Notes (Signed)
CSN: [id number]     Arrival date & time 11/10/14  1147 History   First MD Initiated Contact with Patient 11/10/14 1202     Chief Complaint  Patient presents with  . Altered Mental Status     (Consider location/radiation/quality/duration/timing/severity/associated sxs/prior Treatment) HPI Over 5 caveat altered mental status, patient unresponsive. I attempted to call patient's home, no answer. History is obtained from paramedics. EMS was called patient's home 11 AM today for elevated blood sugar. Home health came to the patient's residence and determined blood sugar to be high. He was treated with 8 units of insulin. Upon EMS arrival [facility] read as high. EMS treated patient with saline 500 mL intravenous bolus. EMS reports the patient was talkative and alert upon arrival. Became unresponsive while en route. Past Medical History  Diagnosis Date  . Neuropathy   . Diabetes mellitus   . Bipolar affect, depressed   . Hypertension   . Arthritis   . Stroke     Mini stroke about 65yrs ago  . Cirrhosis   . Alcohol abuse   . Chronic pain   . Cocaine abuse   . Muscle spasm     both legs  . Encephalopathy, hepatic   . Detached retina   . COPD (chronic obstructive pulmonary disease)     emphysema  . Bronchitis   . Barrett's esophagus   . GERD (gastroesophageal reflux disease)     has ulcer  . Anemia    Past Surgical History  Procedure Laterality Date  . Fracture surgery      Leg and arm 7yrs ago  . Esophagogastroduodenoscopy  04/04/2012    Procedure: ESOPHAGOGASTRODUODENOSCOPY (EGD);  Surgeon: Irene Shipper, MD;  Location: [facility] ENDOSCOPY;  Service: Endoscopy;  Laterality: N/A;  . Esophagogastroduodenoscopy Left 03/13/2013    Procedure: ESOPHAGOGASTRODUODENOSCOPY (EGD);  Surgeon: Arta Silence, MD;  Location: [facility] ENDOSCOPY;  Service: Endoscopy;  Laterality: Left;  [facility] Eye surgery  8 months ago both eyes    cataracts both eyes, detached eye, gas pocket  . Vasectomy    . Pars plana vitrectomy  Left 07/08/2013    Procedure: PARS PLANA VITRECTOMY WITH 25 GAUGE;  Surgeon: Hurman Horn, MD;  Location: [place];  Service: Ophthalmology;  Laterality: Left;  . Intraocular lens removal Left 07/08/2013    Procedure: REMOVAL OF INTRAOCULAR LENS;  Surgeon: Hurman Horn, MD;  Location: [place];  Service: Ophthalmology;  Laterality: Left;  . Placement and suture of secondary intraocular lens Left 07/08/2013    Procedure: PLACEMENT AND SUTURE OF SECONDARY INTRAOCULAR LENS;  Surgeon: Hurman Horn, MD;  Location: [place];  Service: Ophthalmology;  Laterality: Left;  Insertion of Anterior Capsule Intraocular Lens   . Esophagogastroduodenoscopy N/A 01/16/2014    Procedure: ESOPHAGOGASTRODUODENOSCOPY (EGD);  Surgeon: Winfield Cunas., MD;  Location: [facility] ENDOSCOPY;  Service: Endoscopy;  Laterality: N/A;  . Colonoscopy N/A 01/17/2014    Procedure: COLONOSCOPY;  Surgeon: Winfield Cunas., MD;  Location: [facility] ENDOSCOPY;  Service: Endoscopy;  Laterality: N/A;  possible banding  . Esophagogastroduodenoscopy N/A 08/30/2014    Procedure: ESOPHAGOGASTRODUODENOSCOPY (EGD);  Surgeon: Wilford Corner, MD;  Location: [facility] ENDOSCOPY;  Service: Endoscopy;  Laterality: N/A;  bedside   Family History  Problem Relation Age of Onset  . Hypotension Mother    History  Substance Use Topics  . Smoking status: Current Every Day Smoker -- 1.00 packs/day for 30 years    Types: Cigarettes  . Smokeless tobacco: Never Used  . Alcohol Use:  0.0 oz/week     Comment: 12 pk beer daily  06/2013 - no alcohol since 11/2012    Review of Systems  Unable to perform ROS: Mental status change      Allergies  Review of patient's allergies indicates no known allergies.  Home Medications   Prior to Admission medications   Medication Sig Start Date End Date Taking? Authorizing Provider  amitriptyline (ELAVIL) 25 MG tablet Take 1 tablet (25 mg total) by mouth at bedtime. 10/05/14   Nita Sells, MD  CALCIUM-VITAMIN D PO Take 100 Units  by mouth daily.    Historical Provider, MD  clonazePAM (KLONOPIN) 0.25 MG disintegrating tablet Take 0.25 mg by mouth 2 (two) times daily.  10/17/14   Historical Provider, MD  clonazePAM (KLONOPIN) 0.5 MG tablet Take 0.5 tablets (0.25 mg total) by mouth 2 (two) times daily. 10/06/14   Nita Sells, MD  diazepam (VALIUM) 5 MG tablet TAKE 1 TABLET TWICE A DAY 10/17/14   Historical Provider, MD  diclofenac sodium (VOLTAREN) 1 % GEL Apply 4 g topically as needed (for back of leg pain).    Historical Provider, MD  feeding supplement, GLUCERNA SHAKE, (GLUCERNA SHAKE) LIQD Take 237 mLs by mouth 2 (two) times daily between meals. Patient not taking: Reported on 10/31/2014 10/06/14   Nita Sells, MD  folic acid (FOLVITE) 1 MG tablet Take 1 tablet (1 mg total) by mouth daily. 10/05/14   Nita Sells, MD  furosemide (LASIX) 40 MG tablet Take 40 mg by mouth daily.  10/25/14   Historical Provider, MD  furosemide (LASIX) 80 MG tablet Take 1 tablet (80 mg total) by mouth daily. Patient not taking: Reported on 10/31/2014 10/06/14   Nita Sells, MD  insulin aspart (NOVOLOG FLEXPEN) 100 UNIT/ML FlexPen Inject 6 Units into the skin 3 (three) times daily with meals. For diabetes 02/19/14   Encarnacion Slates, NP  insulin glargine (LANTUS) 100 UNIT/ML injection Inject 45 Units into the skin at bedtime.    Historical Provider, MD  lactulose (CHRONULAC) 10 GM/15ML solution Take 45 mLs (30 g total) by mouth 3 (three) times daily. Patient taking differently: Take 20 g by mouth 3 (three) times daily.  09/03/14   Reyne Dumas, MD  lisinopril (PRINIVIL,ZESTRIL) 20 MG tablet Take 20 mg by mouth daily.  08/21/14   Historical Provider, MD  magnesium oxide (MAG-OX) 400 (241.3 MG) MG tablet Take 1 tablet (400 mg total) by mouth 2 (two) times daily. 09/03/14   Reyne Dumas, MD  Multiple Vitamin (MULTIVITAMIN WITH MINERALS) TABS tablet Take 1 tablet by mouth daily. 07/31/14   Geradine Girt, DO  oxyCODONE (OXY  IR/ROXICODONE) 5 MG immediate release tablet Take 5 mg by mouth every 8 (eight) hours. 09/24/14   Historical Provider, MD  pantoprazole (PROTONIX) 40 MG tablet Take 40 mg by mouth daily.  07/09/14   Historical Provider, MD  propranolol (INDERAL) 20 MG tablet Take 20 mg by mouth 2 (two) times daily. 09/24/14   Historical Provider, MD  spironolactone (ALDACTONE) 100 MG tablet Take 2 tablets (200 mg total) by mouth daily. Patient not taking: Reported on 10/31/2014 10/06/14   Nita Sells, MD  spironolactone (ALDACTONE) 25 MG tablet Take 25 mg by mouth 3 (three) times daily.  10/27/14   Historical Provider, MD  thiamine 100 MG tablet Take 1 tablet (100 mg total) by mouth daily. Patient not taking: Reported on 10/31/2014 10/05/14   Nita Sells, MD  XIFAXAN 550 MG TABS tablet Take 550 mg by mouth 2 (  two) times daily.  10/29/14   Historical Provider, MD   BP 157/74 mmHg  Pulse 99  Temp(Src) 97.6 F (36.4 C) (Rectal)  Resp 11  SpO2 99% Physical Exam  Constitutional:  Acute and chronically ill-appearing  HENT:  Head: Normocephalic and atraumatic.  Right Ear: External ear normal.  Left Ear: External ear normal.  Jaw is clenched  Eyes: Conjunctivae are normal. Pupils are equal, round, and reactive to light.  Neck: Neck supple. No tracheal deviation present. No thyromegaly present.  Cardiovascular: Normal rate and regular rhythm.   No murmur heard. Pulmonary/Chest: Effort normal and breath sounds normal.  Abdominal: Soft. Bowel sounds are normal. He exhibits no distension. There is no tenderness.  Musculoskeletal: Normal range of motion. He exhibits edema. He exhibits no tenderness.  4Plus pretibial pitting edema bilaterally  Neurological: He is alert. Coordination normal.  Unresponsive Glasgow Coma Score 3. DTRs symmetric bilaterally knee jerk ankle jerk and biceps. Posterior upward or downward going bilaterally. Upon lifting either thigh up passively off the bed. He holds his knees extended  bilaterally  Skin: Skin is warm and dry. No rash noted.  Psychiatric:  Unresponsive  Nursing note and vitals reviewed.   ED Course  Procedures (including critical care time) Labs Review Labs Reviewed  CBG MONITORING, ED - Abnormal; Notable for the following:    Glucose-Capillary 106 (*)    All other components within normal limits  AMMONIA  PROTIME-INR  CBC  COMPREHENSIVE METABOLIC PANEL  URINALYSIS, ROUTINE W REFLEX MICROSCOPIC (NOT AT [facility])  BLOOD GAS, VENOUS  URINE RAPID DRUG SCREEN, HOSP PERFORMED    Imaging Review No results found.   EKG Interpretation None     3 PM patient is arousable to verbal stimulus with opening his eyes. He does not follow simple commands. He answers in one or 2 word answers, answers are inappropriate., He remains somnolent however level of consciousness has improved since arrival Glasgow Coma Score now equals 8.  Results for orders placed or performed during the hospital encounter of 11/10/14  Ammonia  Result Value Ref Range   Ammonia 228 (H) 9 - 35 umol/L  Protime-INR  Result Value Ref Range   Prothrombin Time 19.0 (H) 11.6 - 15.2 seconds   INR 1.59 (H) 0.00 - 1.49  CBC  Result Value Ref Range   WBC 3.7 (L) 4.0 - 10.5 K/uL   RBC 2.93 (L) 4.22 - 5.81 MIL/uL   Hemoglobin 9.5 (L) 13.0 - 17.0 g/dL   HCT 27.2 (L) 39.0 - 52.0 %   MCV 92.8 78.0 - 100.0 fL   MCH 32.4 26.0 - 34.0 pg   MCHC 34.9 30.0 - 36.0 g/dL   RDW 19.5 (H) 11.5 - 15.5 %   Platelets 64 (L) 150 - 400 K/uL  Comprehensive metabolic panel  Result Value Ref Range   Sodium 130 (L) 135 - 145 mmol/L   Potassium 5.6 (H) 3.5 - 5.1 mmol/L   Chloride 103 101 - 111 mmol/L   CO2 21 (L) 22 - 32 mmol/L   Glucose, Bld 98 65 - 99 mg/dL   BUN 22 (H) 6 - 20 mg/dL   Creatinine, Ser 1.12 0.61 - 1.24 mg/dL   Calcium 8.1 (L) 8.9 - 10.3 mg/dL   Total Protein 6.1 (L) 6.5 - 8.1 g/dL   Albumin 2.5 (L) 3.5 - 5.0 g/dL   AST 96 (H) 15 - 41 U/L   ALT 52 17 - 63 U/L   Alkaline Phosphatase 121 38  - 126  U/L   Total Bilirubin 2.8 (H) 0.3 - 1.2 mg/dL   GFR calc non Af Amer >60 >60 mL/min   GFR calc Af Amer >60 >60 mL/min   Anion gap 6 5 - 15  Urinalysis, Routine w reflex microscopic (not at [facility])  Result Value Ref Range   Color, Urine YELLOW YELLOW   APPearance CLEAR CLEAR   Specific Gravity, Urine 1.015 1.005 - 1.030   pH 6.0 5.0 - 8.0   Glucose, UA NEGATIVE NEGATIVE mg/dL   Hgb urine dipstick NEGATIVE NEGATIVE   Bilirubin Urine NEGATIVE NEGATIVE   Ketones, ur NEGATIVE NEGATIVE mg/dL   Protein, ur NEGATIVE NEGATIVE mg/dL   Urobilinogen, UA 0.2 0.0 - 1.0 mg/dL   Nitrite NEGATIVE NEGATIVE   Leukocytes, UA NEGATIVE NEGATIVE  Urine rapid drug screen (hosp performed)  Result Value Ref Range   Opiates NONE DETECTED NONE DETECTED   Cocaine NONE DETECTED NONE DETECTED   Benzodiazepines POSITIVE (A) NONE DETECTED   Amphetamines NONE DETECTED NONE DETECTED   Tetrahydrocannabinol NONE DETECTED NONE DETECTED   Barbiturates NONE DETECTED NONE DETECTED  Ethanol  Result Value Ref Range   Alcohol, Ethyl (B) <5 <5 mg/dL  CBG monitoring, ED  Result Value Ref Range   Glucose-Capillary 106 (H) 65 - 99 mg/dL  I-Stat venous blood gas, ED  Result Value Ref Range   pH, Ven 7.441 (H) 7.250 - 7.300   pCO2, Ven 30.1 (L) 45.0 - 50.0 mmHg   pO2, Ven 67.0 (H) 30.0 - 45.0 mmHg   Bicarbonate 20.5 20.0 - 24.0 mEq/L   TCO2 21 0 - 100 mmol/L   O2 Saturation 94.0 %   Acid-base deficit 3.0 (H) 0.0 - 2.0 mmol/L   Sample type VENOUS    Ct Head Wo Contrast  11/10/2014   CLINICAL DATA:  Altered mental status, hyperglycemia, unresponsive, history diabetes mellitus, cirrhosis, hepatic encephalopathy, COPD, prior stroke, hypertension  EXAM: CT HEAD WITHOUT CONTRAST  TECHNIQUE: Contiguous axial images were obtained from the base of the skull through the vertex without intravenous contrast.  COMPARISON:  08/29/2014  FINDINGS: Mild generalized atrophy.  Normal ventricular morphology.  No midline shift or mass  effect.  Otherwise normal appearance of brain parenchyma.  No intracranial hemorrhage, mass lesion, or acute infarction.  Visualized paranasal sinuses clear.  Bones unremarkable.  IMPRESSION: No acute intracranial abnormalities.   Electronically Signed   By: Lavonia Dana M.D.   On: 11/10/2014 13:42   US Paracentesis  10/31/2014   INDICATION: Recurrent ascites and request for therapeutic paracentesis.  EXAM: ULTRASOUND-GUIDED PARACENTESIS  COMPARISON:  10/15/14 paracentesis.  MEDICATIONS: None.  COMPLICATIONS: None immediate  TECHNIQUE: Informed written consent was obtained from the patient after a discussion of the risks, benefits and alternatives to treatment. A timeout was performed prior to the initiation of the procedure.  Initial ultrasound scanning demonstrates a moderate to large amount of ascites within the left lower abdominal quadrant. The left lower abdomen was prepped and draped in the usual sterile fashion. 1% lidocaine was used for local anesthesia.  An ultrasound image was saved for documentation purposed. A 6 Fr Safe-T-Centesis catheter was introduced. The paracentesis was performed. The catheter was removed and a dressing was applied. The patient tolerated the procedure well without immediate post procedural complication.  FINDINGS: A total of approximately 3.8 liters of serous fluid was removed.  IMPRESSION: Successful ultrasound-guided paracentesis yielding 3.8 liters of peritoneal fluid.  Read By:  Tsosie Billing PA-C   Electronically Signed   By: Myrle Sheng  Laurence Ferrari M.D.   On: 10/31/2014 11:18   US Paracentesis  10/15/2014   CLINICAL DATA:  ascites  EXAM: ULTRASOUND GUIDED  PARACENTESIS  COMPARISON:  None.  PROCEDURE: An ultrasound guided paracentesis was thoroughly discussed with the patient and questions answered. The benefits, risks, alternatives and complications were also discussed. The patient understands and wishes to proceed with the procedure. Written consent was obtained.  Ultrasound  was performed to localize and mark an adequate pocket of fluid in the right quadrant of the abdomen. The area was then prepped and draped in the normal sterile fashion. 1% Lidocaine was used for local anesthesia. Under ultrasound guidance a 19 gauge Yueh catheter was introduced. Paracentesis was performed. The catheter was removed and a dressing applied.  COMPLICATIONS: None.  FINDINGS: A total of approximately 3.4 Liters of clear yellow fluid was removed. A fluid sample was not sent for laboratory analysis.  IMPRESSION: Successful ultrasound guided paracentesis yielding 3.4 liters of ascites.  Read by:  Gareth Eagle, PA-C   Electronically Signed   By: Aletta Edouard M.D.   On: 10/15/2014 15:29   Dg Chest Port 1 View  11/10/2014   CLINICAL DATA:  Unresponsive, unable to follow commands, abdominal distension, history cirrhosis, diabetes mellitus, hypertension, COPD, hepatic encephalopathy  EXAM: PORTABLE CHEST - 1 VIEW  COMPARISON:  Portable exam 1236 hours compared to 09/18/2014  FINDINGS: Decreased lung volumes.  Upper normal heart size.  Normal mediastinal contours and pulmonary vascularity.  Minimal RIGHT basilar atelectasis.  Lungs otherwise clear.  No pleural effusion or pneumothorax.  Bones unremarkable.  IMPRESSION: Minimal RIGHT basilar atelectasis.   Electronically Signed   By: Lavonia Dana M.D.   On: 11/10/2014 13:10  pt had no repsonse to iv narcan administered  MDM  Patient suffering acute encephalopathy, drug-induced and/or hepatic in etiology Patient will have NG tube inserted and lactulose instilled through NG tube spoke with hospitalist Plan admit step down unit  Final diagnoses:  Alteration consciousness  Dx #1 acute encephalopathy #2anemia #3hyperkalemia #4 thrombocytopenia #5 hyperbilirubinemia  CRITICAL CARE Performed by: Orlie Dakin Total critical care time: 45 minute Critical care time was exclusive of separately billable procedures and treating other patients. Critical  care was necessary to treat or prevent imminent or life-threatening deterioration. Critical care was time spent personally by me on the following activities: development of treatment plan with patient and/or surrogate as well as nursing, discussions with consultants, evaluation of patient's response to treatment, examination of patient, obtaining history from patient or surrogate, ordering and performing treatments and interventions, ordering and review of laboratory studies, ordering and review of radiographic studies, pulse oximetry and re-evaluation of patient's condition.    Orlie Dakin, MD 11/10/14 1540

## 2014-11-10 NOTE — Progress Notes (Signed)
[place] Progress Note Patient Name: BRANDOM KERWIN DOB: [birth year] MRN: [id number]   Date of Service  11/10/2014  HPI/Events of Note  No dvt prophylaxis  eICU Interventions  scd     Intervention Category Intermediate Interventions: Best-practice therapies (e.g. DVT, beta blocker, etc.)  Mahalie Kanner 11/10/2014, 10:24 PM

## 2014-11-10 NOTE — ED Notes (Signed)
Pt arrived from home by [facility]. At 1000 this morning pt CBG 296 and 8 units of regular insulin was administered by friend/caregiver that comes to apartment some. [facility] came out to house at 1100 and CBG was reading high, EMS was called and CBG was still reading high. EMS administered 575ml of fluid IV. Pt initially responsive verbally but now unresponsive and not following commands. Pt had paracentesis done on 10/30/14 and abdomen is distended. Pt lives at home alone and [facility] has been trying to get paper work together to find placement at skilled nursing facility but unable to get placement at this time. EMS VS: BP-130/95 HR-86 O2sat-98% ra Resp 12

## 2014-11-10 NOTE — H&P (Signed)
[facility] History and Physical  [name] HKV:[id number] DOB: Dec 02, 1955 DOA: 11/10/2014  Referring physician: EDP PCP: Barbette Merino, MD   Chief Complaint: unresponsive  HPI: [name] is a 59 y.o. male with past medical history of alcohol abuse, cirrhosis, ascites, recurrent admissions for hepatic encephalopathy, history of noncompliance, polysubstance abuse, chronic diastolic CHF, type 2 diabetes is here in the ER of the above complaints. Patient unable to provide any history due to mentation at this time, apparently EMS cart called home at 11:00 this morning for elevated blood sugar, he was found to be hyperglycemic and was given 8 units of insulin, he was initially talkative but lethargic when EMS arrived however subsequently became poorly responsive on route. His mentation is slowly started to improve in the emergency room, still very lethargic but arousable, answers yes or no to a few questions, an NG tube was placed in the emergency room. His ammonia level was significantly elevated at 200, potassium is 5.6  Review of Systems: Unable to obtain due to mentation Constitutional:  No weight loss, night sweats, Fevers, chills, fatigue.  HEENT:  No headaches, Difficulty swallowing,Tooth/dental problems,Sore throat,  No sneezing, itching, ear ache, nasal congestion, post nasal drip,  Cardio-vascular:  No chest pain, Orthopnea, PND, swelling in lower extremities, anasarca, dizziness, palpitations  GI:  No heartburn, indigestion, abdominal pain, nausea, vomiting, diarrhea, change in bowel habits, loss of appetite  Resp:  No shortness of breath with exertion or at rest. No excess mucus, no productive cough, No non-productive cough, No coughing up of blood.No change in color of mucus.No wheezing.No chest wall deformity  Skin:  no rash or lesions.  GU:  no dysuria, change in color of urine, no urgency or frequency. No flank pain.  Musculoskeletal:  No joint pain or swelling.  No decreased range of motion. No back pain.  Psych:  No change in mood or affect. No depression or anxiety. No memory loss.   Past Medical History  Diagnosis Date  . Neuropathy   . Diabetes mellitus   . Bipolar affect, depressed   . Hypertension   . Arthritis   . Stroke     Mini stroke about 56yrs ago  . Cirrhosis   . Alcohol abuse   . Chronic pain   . Cocaine abuse   . Muscle spasm     both legs  . Encephalopathy, hepatic   . Detached retina   . COPD (chronic obstructive pulmonary disease)     emphysema  . Bronchitis   . Barrett's esophagus   . GERD (gastroesophageal reflux disease)     has ulcer  . Anemia    Past Surgical History  Procedure Laterality Date  . Fracture surgery      Leg and arm 37yrs ago  . Esophagogastroduodenoscopy  04/04/2012    Procedure: ESOPHAGOGASTRODUODENOSCOPY (EGD);  Surgeon: Irene Shipper, MD;  Location: [facility] ENDOSCOPY;  Service: Endoscopy;  Laterality: N/A;  . Esophagogastroduodenoscopy Left 03/13/2013    Procedure: ESOPHAGOGASTRODUODENOSCOPY (EGD);  Surgeon: Arta Silence, MD;  Location[facility] ENDOSCOPY;  Service: Endoscopy;  Laterality: Left;  Marland Kitchen Eye surgery  8 months ago both eyes    cataracts both eyes, detached eye, gas pocket  . Vasectomy    . Pars plana vitrectomy Left 07/08/2013    Procedure: PARS PLANA VITRECTOMY WITH 25 GAUGE;  Surgeon: Hurman Horn, MD;  Location: [place];  Service: Ophthalmology;  Laterality: Left;  . Intraocular lens removal Left 07/08/2013    Procedure: REMOVAL OF INTRAOCULAR  LENS;  Surgeon: Hurman Horn, MD;  Location: [place];  Service: Ophthalmology;  Laterality: Left;  . Placement and suture of secondary intraocular lens Left 07/08/2013    Procedure: PLACEMENT AND SUTURE OF SECONDARY INTRAOCULAR LENS;  Surgeon: Hurman Horn, MD;  Location: [place];  Service: Ophthalmology;  Laterality: Left;  Insertion of Anterior Capsule Intraocular Lens   . Esophagogastroduodenoscopy N/A 01/16/2014    Procedure:  ESOPHAGOGASTRODUODENOSCOPY (EGD);  Surgeon: Winfield Cunas., MD;  Location: [facility] ENDOSCOPY;  Service: Endoscopy;  Laterality: N/A;  . Colonoscopy N/A 01/17/2014    Procedure: COLONOSCOPY;  Surgeon: Winfield Cunas., MD;  Location: [facility] ENDOSCOPY;  Service: Endoscopy;  Laterality: N/A;  possible banding  . Esophagogastroduodenoscopy N/A 08/30/2014    Procedure: ESOPHAGOGASTRODUODENOSCOPY (EGD);  Surgeon: Wilford Corner, MD;  Location: [facility] ENDOSCOPY;  Service: Endoscopy;  Laterality: N/A;  bedside   Social History:  reports that he has been smoking Cigarettes.  He has a 30 pack-year smoking history. He has never used smokeless tobacco. He reports that he drinks alcohol. He reports that he uses illicit drugs (Cocaine).  No Known Allergies  Family History  Problem Relation Age of Onset  . Hypotension Mother     Prior to Admission medications   Medication Sig Start Date End Date Taking? Authorizing Provider  amitriptyline (ELAVIL) 25 MG tablet Take 1 tablet (25 mg total) by mouth at bedtime. 10/05/14   Nita Sells, MD  CALCIUM-VITAMIN D PO Take 100 Units by mouth daily.    Historical Provider, MD  clonazePAM (KLONOPIN) 0.25 MG disintegrating tablet Take 0.25 mg by mouth 2 (two) times daily.  10/17/14   Historical Provider, MD  clonazePAM (KLONOPIN) 0.5 MG tablet Take 0.5 tablets (0.25 mg total) by mouth 2 (two) times daily. 10/06/14   Nita Sells, MD  diazepam (VALIUM) 5 MG tablet TAKE 1 TABLET TWICE A DAY 10/17/14   Historical Provider, MD  diclofenac sodium (VOLTAREN) 1 % GEL Apply 4 g topically as needed (for back of leg pain).    Historical Provider, MD  feeding supplement, GLUCERNA SHAKE, (GLUCERNA SHAKE) LIQD Take 237 mLs by mouth 2 (two) times daily between meals. Patient not taking: Reported on 10/31/2014 10/06/14   Nita Sells, MD  folic acid (FOLVITE) 1 MG tablet Take 1 tablet (1 mg total) by mouth daily. 10/05/14   Nita Sells, MD  furosemide (LASIX) 40 MG  tablet Take 40 mg by mouth daily.  10/25/14   Historical Provider, MD  furosemide (LASIX) 80 MG tablet Take 1 tablet (80 mg total) by mouth daily. Patient not taking: Reported on 10/31/2014 10/06/14   Nita Sells, MD  insulin aspart (NOVOLOG FLEXPEN) 100 UNIT/ML FlexPen Inject 6 Units into the skin 3 (three) times daily with meals. For diabetes 02/19/14   Encarnacion Slates, NP  insulin glargine (LANTUS) 100 UNIT/ML injection Inject 45 Units into the skin at bedtime.    Historical Provider, MD  lactulose (CHRONULAC) 10 GM/15ML solution Take 45 mLs (30 g total) by mouth 3 (three) times daily. Patient taking differently: Take 20 g by mouth 3 (three) times daily.  09/03/14   Reyne Dumas, MD  lisinopril (PRINIVIL,ZESTRIL) 20 MG tablet Take 20 mg by mouth daily.  08/21/14   Historical Provider, MD  magnesium oxide (MAG-OX) 400 (241.3 MG) MG tablet Take 1 tablet (400 mg total) by mouth 2 (two) times daily. 09/03/14   Reyne Dumas, MD  Multiple Vitamin (MULTIVITAMIN WITH MINERALS) TABS tablet Take 1 tablet by mouth daily.  07/31/14   Geradine Girt, DO  oxyCODONE (OXY IR/ROXICODONE) 5 MG immediate release tablet Take 5 mg by mouth every 8 (eight) hours. 09/24/14   Historical Provider, MD  pantoprazole (PROTONIX) 40 MG tablet Take 40 mg by mouth daily.  07/09/14   Historical Provider, MD  propranolol (INDERAL) 20 MG tablet Take 20 mg by mouth 2 (two) times daily. 09/24/14   Historical Provider, MD  spironolactone (ALDACTONE) 100 MG tablet Take 2 tablets (200 mg total) by mouth daily. Patient not taking: Reported on 10/31/2014 10/06/14   Nita Sells, MD  spironolactone (ALDACTONE) 25 MG tablet Take 25 mg by mouth 3 (three) times daily.  10/27/14   Historical Provider, MD  thiamine 100 MG tablet Take 1 tablet (100 mg total) by mouth daily. Patient not taking: Reported on 10/31/2014 10/05/14   Nita Sells, MD  XIFAXAN 550 MG TABS tablet Take 550 mg by mouth 2 (two) times daily.  10/29/14   Historical Provider,  MD   Physical Exam: Filed Vitals:   11/10/14 1500 11/10/14 1529 11/10/14 1545 11/10/14 1630  BP: 152/72 134/64 137/62 146/63  Pulse: 102 101 103 102  Temp:      TempSrc:      Resp: 20 17 19 15   SpO2: 99% 99% 99% 98%    Wt Readings from Last 3 Encounters:  10/06/14 99.338 kg (219 lb)  09/03/14 102.4 kg (225 lb 12 oz)  08/17/14 95.709 kg (211 lb)    General:   Obtunded, eyes closed, few verbal responses. Eyes: Pupils both dilated but reactive to light, gag reflex is diminished but present ENT: grossly normal hearing, lips & tongue Neck: no LAD, masses or thyromegaly Cardiovascular: RRR, no m/r/g. 3plus  LE edema. Telemetry: SR, no arrhythmias  Respiratory: Poor effort, diminished breath sounds Abdomen: Soft, mildly distended, fluid thrill, nontender, bowel sounds present Skin: no rash or induration seen on limited exam Musculoskeletal: 3+ edema Psychiatric: Unable to assess Neurologic: Obtunded but nonfocal .          Labs on Admission:  Basic Metabolic Panel:  Recent Labs Lab 11/10/14 1230  NA 130*  K 5.6*  CL 103  CO2 21*  GLUCOSE 98  BUN 22*  CREATININE 1.12  CALCIUM 8.1*   Liver Function Tests:  Recent Labs Lab 11/10/14 1230  AST 96*  ALT 52  ALKPHOS 121  BILITOT 2.8*  PROT 6.1*  ALBUMIN 2.5*   No results for input(s): LIPASE, AMYLASE in the last 168 hours.  Recent Labs Lab 11/10/14 1230  AMMONIA 228*   CBC:  Recent Labs Lab 11/10/14 1230  WBC 3.7*  HGB 9.5*  HCT 27.2*  MCV 92.8  PLT 64*   Cardiac Enzymes: No results for input(s): CKTOTAL, CKMB, CKMBINDEX, TROPONINI in the last 168 hours.  BNP (last 3 results)  Recent Labs  08/14/14 1910  BNP 29.3    ProBNP (last 3 results)  Recent Labs  02/08/14 1830 02/10/14 1547  PROBNP 966.3* 805.5*    CBG:  Recent Labs Lab 11/10/14 1153  GLUCAP 106*    Radiological Exams on Admission: Ct Head Wo Contrast  11/10/2014   CLINICAL DATA:  Altered mental status,  hyperglycemia, unresponsive, history diabetes mellitus, cirrhosis, hepatic encephalopathy, COPD, prior stroke, hypertension  EXAM: CT HEAD WITHOUT CONTRAST  TECHNIQUE: Contiguous axial images were obtained from the base of the skull through the vertex without intravenous contrast.  COMPARISON:  08/29/2014  FINDINGS: Mild generalized atrophy.  Normal ventricular morphology.  No midline shift or  mass effect.  Otherwise normal appearance of brain parenchyma.  No intracranial hemorrhage, mass lesion, or acute infarction.  Visualized paranasal sinuses clear.  Bones unremarkable.  IMPRESSION: No acute intracranial abnormalities.   Electronically Signed   By: Lavonia Dana M.D.   On: 11/10/2014 13:42   Dg Chest Port 1 View  11/10/2014   CLINICAL DATA:  Status post nasogastric catheter placement  EXAM: PORTABLE CHEST - 1 VIEW  COMPARISON:  11/10/2014  FINDINGS: Cardiac shadow is stable. Mild right basilar atelectasis is again identified. The overall inspiratory effort is poor. A nasogastric catheter is noted extending into the stomach. No other focal abnormality is noted.  IMPRESSION: Nasogastric catheter within the stomach.   Electronically Signed   By: Inez Catalina M.D.   On: 11/10/2014 15:51   Dg Chest Port 1 View  11/10/2014   CLINICAL DATA:  Unresponsive, unable to follow commands, abdominal distension, history cirrhosis, diabetes mellitus, hypertension, COPD, hepatic encephalopathy  EXAM: PORTABLE CHEST - 1 VIEW  COMPARISON:  Portable exam 1236 hours compared to 09/18/2014  FINDINGS: Decreased lung volumes.  Upper normal heart size.  Normal mediastinal contours and pulmonary vascularity.  Minimal RIGHT basilar atelectasis.  Lungs otherwise clear.  No pleural effusion or pneumothorax.  Bones unremarkable.  IMPRESSION: Minimal RIGHT basilar atelectasis.   Electronically Signed   By: Lavonia Dana M.D.   On: 11/10/2014 13:10      Assessment/Plan     Hepatic encephalopathy -Long history of noncompliance with  medication suspect same noe -Give lactulose and rifaximin via NG tube -Admit to stepdown unit -Mentation slowly starting to improve -NPO   Hyperkalemia -Suspect secondary to Aldactone -Given Kayexalate for the NG tube, hold Aldactone  Alcoholic cirrhosis with ascites -Hold diuretics at this time - resume propranolol    Diabetes mellitus - continue Lantus at lower dose, sliding scale insulin    Anemia /Thrombocytopenia -Due to splenic sequestration from ascites -Stable monitor    Bipolar affect, depressed -Resume Klonopin at low-dose went on mentation improved  Code Status:Full Code DVT Prophylaxis: SCDs Family Communication:none at bedside Disposition Plan: inpatient  Time spent: 33min  [place] Madding [facility] Pager [phone]

## 2014-11-11 ENCOUNTER — Inpatient Hospital Stay (HOSPITAL_COMMUNITY): Payer: Medicare PPO

## 2014-11-11 DIAGNOSIS — F314 Bipolar disorder, current episode depressed, severe, without psychotic features: Secondary | ICD-10-CM

## 2014-11-11 DIAGNOSIS — K7290 Hepatic failure, unspecified without coma: Principal | ICD-10-CM

## 2014-11-11 DIAGNOSIS — G9340 Encephalopathy, unspecified: Secondary | ICD-10-CM

## 2014-11-11 DIAGNOSIS — E139 Other specified diabetes mellitus without complications: Secondary | ICD-10-CM

## 2014-11-11 DIAGNOSIS — D696 Thrombocytopenia, unspecified: Secondary | ICD-10-CM

## 2014-11-11 LAB — CBC
HEMATOCRIT: 24.4 % — AB (ref 39.0–52.0)
Hemoglobin: 8.4 g/dL — ABNORMAL LOW (ref 13.0–17.0)
MCH: 32.8 pg (ref 26.0–34.0)
MCHC: 34.4 g/dL (ref 30.0–36.0)
MCV: 95.3 fL (ref 78.0–100.0)
PLATELETS: 55 10*3/uL — AB (ref 150–400)
RBC: 2.56 MIL/uL — ABNORMAL LOW (ref 4.22–5.81)
RDW: 19.8 % — AB (ref 11.5–15.5)
WBC: 4.8 10*3/uL (ref 4.0–10.5)

## 2014-11-11 LAB — COMPREHENSIVE METABOLIC PANEL
ALBUMIN: 2.1 g/dL — AB (ref 3.5–5.0)
ALT: 42 U/L (ref 17–63)
ANION GAP: 5 (ref 5–15)
AST: 72 U/L — ABNORMAL HIGH (ref 15–41)
Alkaline Phosphatase: 98 U/L (ref 38–126)
BUN: 24 mg/dL — AB (ref 6–20)
CHLORIDE: 105 mmol/L (ref 101–111)
CO2: 21 mmol/L — ABNORMAL LOW (ref 22–32)
Calcium: 7.7 mg/dL — ABNORMAL LOW (ref 8.9–10.3)
Creatinine, Ser: 1.07 mg/dL (ref 0.61–1.24)
GFR calc Af Amer: >60 mL/min (ref >60)
GFR calc non Af Amer: >60 mL/min (ref >60)
Glucose, Bld: 97 mg/dL (ref 65–99)
POTASSIUM: 4.5 mmol/L (ref 3.5–5.1)
Sodium: 131 mmol/L — ABNORMAL LOW (ref 135–145)
Total Bilirubin: 3.1 mg/dL — ABNORMAL HIGH (ref 0.3–1.2)
Total Protein: 5.1 g/dL — ABNORMAL LOW (ref 6.5–8.1)

## 2014-11-11 LAB — GLUCOSE, SEROUS FLUID: Glucose, Fluid: 100 mg/dL

## 2014-11-11 LAB — LACTATE DEHYDROGENASE, PLEURAL OR PERITONEAL FLUID: LD FL: 26 U/L — AB (ref 3–23)

## 2014-11-11 LAB — GLUCOSE, CAPILLARY
GLUCOSE-CAPILLARY: 91 mg/dL (ref 65–99)
Glucose-Capillary: 111 mg/dL — ABNORMAL HIGH (ref 65–99)
Glucose-Capillary: 140 mg/dL — ABNORMAL HIGH (ref 65–99)
Glucose-Capillary: 78 mg/dL (ref 65–99)
Glucose-Capillary: 88 mg/dL (ref 65–99)
Glucose-Capillary: 90 mg/dL (ref 65–99)

## 2014-11-11 LAB — BODY FLUID CELL COUNT WITH DIFFERENTIAL
Eos, Fluid: NONE SEEN %
Lymphs, Fluid: 26 %
Monocyte-Macrophage-Serous Fluid: 64 % (ref 50–90)
NEUTROPHIL FLUID: 10 % (ref 0–25)
Total Nucleated Cell Count, Fluid: 105 cu mm (ref 0–1000)

## 2014-11-11 LAB — GRAM STAIN

## 2014-11-11 LAB — PROTEIN, BODY FLUID

## 2014-11-11 LAB — AMMONIA: Ammonia: 49 umol/L — ABNORMAL HIGH (ref 9–35)

## 2014-11-11 MED ORDER — LIDOCAINE HCL (PF) 1 % IJ SOLN
INTRAMUSCULAR | Status: AC
  Filled 2014-11-11: qty 10

## 2014-11-11 NOTE — Progress Notes (Signed)
Utilization Review Completed.  

## 2014-11-11 NOTE — Progress Notes (Signed)
[facility]                                                                              Patient Demographics  [name], is a 59 y.o. male, DOB - 17-Feb-1956, HCW:[id number]  Admit date - 11/10/2014   Admitting Physician Domenic Polite, MD  Outpatient Primary MD for the patient is Barbette Merino, MD  LOS - 1   Chief Complaint  Patient presents with  . Altered Mental Status      HPI on 11/10/2014 by Dr. Domenic Polite [name] is a 59 y.o. male with past medical history of alcohol abuse, cirrhosis, ascites, recurrent admissions for hepatic encephalopathy, history of noncompliance, polysubstance abuse, chronic diastolic CHF, type 2 diabetes is here in the ER of the above complaints. Patient unable to provide any history due to mentation at this time, apparently EMS cart called home at 11:00 this morning for elevated blood sugar, he was found to be hyperglycemic and was given 8 units of insulin, he was initially talkative but lethargic when EMS arrived however subsequently became poorly responsive on route. His mentation is slowly started to improve in the emergency room, still very lethargic but arousable, answers yes or no to a few questions, an NG tube was placed in the emergency room. His ammonia level was significantly elevated at 200, potassium is 5.6  Assessment & Plan   Hepatic encephalopathy -Long history of noncompliance with medication  -Continue lactulose and rifaximin via NG tube -Still altered -Ammonia improved from 228 (upon admission) to 49 -CT head: no acute intracranial abnormalities -Tox screen negative  Hyperkalemia -Resolved, Likely secondary to Aldactone -Patient was given Kayexalate for the NG tube upon admission -Aldactone held  Alcoholic cirrhosis with ascites/Elevated transaminases -Hold diuretics at this time -Continue propranolol -Will order paracentesis  Diabetes mellitus -continue Lantus, sliding scale insulin, CBG  monitoring  Normocytic Anemia /Thrombocytopenia -Due to splenic sequestration from ascites -Hb baseline aaround 10, currently 8.4.   -Will continue to monitor CBC  Bipolar affect, depressed -Continue Klonopin at low-dose went on mentation improved  Code Status: Full  Family Communication: None at bedside  Disposition Plan: Admitted.  Still altered.  Continue to monitor in step down.  Time Spent in minutes   30 minutes  Procedures  None  Consults   None  DVT Prophylaxis  SCDs  Lab Results  Component Value Date   PLT 55* 11/11/2014    Medications  Scheduled Meds: . antiseptic oral rinse  7 mL Mouth Rinse BID  . insulin aspart  0-9 Units Subcutaneous TID WC  . insulin glargine  30 Units Subcutaneous QHS  . lactulose  30 g Per Tube TID  . pantoprazole (PROTONIX) IV  40 mg Intravenous Q24H  . propranolol  20 mg Per NG tube BID  . rifaximin  550 mg Per Tube BID   Continuous Infusions:  PRN Meds:.  Antibiotics    Anti-infectives    Start     Dose/Rate Route Frequency Ordered Stop   11/10/14 2200  rifaximin (XIFAXAN) tablet 550 mg     550 mg Per Tube 2 times daily 11/10/14 1842        Subjective:   Antony Haste  Lesage seen and examined today.  Patient continues to be confused.  Knows his name, not DOB.    Objective:   Filed Vitals:   11/11/14 0700 11/11/14 0712 11/11/14 0943 11/11/14 1158  BP: 104/62  110/56 118/55  Pulse: 73 71 68 63  Temp: 97.7 F (36.5 C)   98.1 F (36.7 C)  TempSrc: Axillary   Axillary  Resp: 18 15  15   Height:      Weight:      SpO2: 99% 99%  100%    Wt Readings from Last 3 Encounters:  11/10/14 102.2 kg (225 lb 5 oz)  10/06/14 99.338 kg (219 lb)  09/03/14 102.4 kg (225 lb 12 oz)    No intake or output data in the 24 hours ending 11/11/14 1210  Exam  General: Well developed, well nourished, NAD, appears stated age  HEENT: NCAT, mucous membranes moist.    Cardiovascular: S1 S2 auscultated, no rubs, murmurs or gallops.  Regular rate and rhythm.  Respiratory: Diminished breath sounds, clear  Abdomen: Soft, nontender, mildly distended, + bowel sounds, +ascites  Extremities: warm dry without cyanosis clubbing. 3+ edema LE B/L  Neuro: Awake, oriented to self.  Cannot answer questions or follow commands.   Data Review   Micro Results Recent Results (from the past 240 hour(s))  MRSA PCR Screening     Status: None   Collection Time: 11/10/14  6:53 PM  Result Value Ref Range Status   MRSA by PCR NEGATIVE NEGATIVE Final    Comment:        The GeneXpert MRSA Assay (FDA approved for NASAL specimens only), is one component of a comprehensive MRSA colonization surveillance program. It is not intended to diagnose MRSA infection nor to guide or monitor treatment for MRSA infections.     Radiology Reports Ct Head Wo Contrast  11/10/2014   CLINICAL DATA:  Altered mental status, hyperglycemia, unresponsive, history diabetes mellitus, cirrhosis, hepatic encephalopathy, COPD, prior stroke, hypertension  EXAM: CT HEAD WITHOUT CONTRAST  TECHNIQUE: Contiguous axial images were obtained from the base of the skull through the vertex without intravenous contrast.  COMPARISON:  08/29/2014  FINDINGS: Mild generalized atrophy.  Normal ventricular morphology.  No midline shift or mass effect.  Otherwise normal appearance of brain parenchyma.  No intracranial hemorrhage, mass lesion, or acute infarction.  Visualized paranasal sinuses clear.  Bones unremarkable.  IMPRESSION: No acute intracranial abnormalities.   Electronically Signed   By: Lavonia Dana M.D.   On: 11/10/2014 13:42   US Paracentesis  10/31/2014   INDICATION: Recurrent ascites and request for therapeutic paracentesis.  EXAM: ULTRASOUND-GUIDED PARACENTESIS  COMPARISON:  10/15/14 paracentesis.  MEDICATIONS: None.  COMPLICATIONS: None immediate  TECHNIQUE: Informed written consent was obtained from the patient after a discussion of the risks, benefits and alternatives  to treatment. A timeout was performed prior to the initiation of the procedure.  Initial ultrasound scanning demonstrates a moderate to large amount of ascites within the left lower abdominal quadrant. The left lower abdomen was prepped and draped in the usual sterile fashion. 1% lidocaine was used for local anesthesia.  An ultrasound image was saved for documentation purposed. A 6 Fr Safe-T-Centesis catheter was introduced. The paracentesis was performed. The catheter was removed and a dressing was applied. The patient tolerated the procedure well without immediate post procedural complication.  FINDINGS: A total of approximately 3.8 liters of serous fluid was removed.  IMPRESSION: Successful ultrasound-guided paracentesis yielding 3.8 liters of peritoneal fluid.  Read By:  Levan Hurst  Lilia Pro PA-C   Electronically Signed   By: Jacqulynn Cadet M.D.   On: 10/31/2014 11:18   US Paracentesis  10/15/2014   CLINICAL DATA:  ascites  EXAM: ULTRASOUND GUIDED  PARACENTESIS  COMPARISON:  None.  PROCEDURE: An ultrasound guided paracentesis was thoroughly discussed with the patient and questions answered. The benefits, risks, alternatives and complications were also discussed. The patient understands and wishes to proceed with the procedure. Written consent was obtained.  Ultrasound was performed to localize and mark an adequate pocket of fluid in the right quadrant of the abdomen. The area was then prepped and draped in the normal sterile fashion. 1% Lidocaine was used for local anesthesia. Under ultrasound guidance a 19 gauge Yueh catheter was introduced. Paracentesis was performed. The catheter was removed and a dressing applied.  COMPLICATIONS: None.  FINDINGS: A total of approximately 3.4 Liters of clear yellow fluid was removed. A fluid sample was not sent for laboratory analysis.  IMPRESSION: Successful ultrasound guided paracentesis yielding 3.4 liters of ascites.  Read by:  Gareth Eagle, PA-C   Electronically Signed    By: Aletta Edouard M.D.   On: 10/15/2014 15:29   Dg Chest Port 1 View  11/10/2014   CLINICAL DATA:  Status post nasogastric catheter placement  EXAM: PORTABLE CHEST - 1 VIEW  COMPARISON:  11/10/2014  FINDINGS: Cardiac shadow is stable. Mild right basilar atelectasis is again identified. The overall inspiratory effort is poor. A nasogastric catheter is noted extending into the stomach. No other focal abnormality is noted.  IMPRESSION: Nasogastric catheter within the stomach.   Electronically Signed   By: Inez Catalina M.D.   On: 11/10/2014 15:51   Dg Chest Port 1 View  11/10/2014   CLINICAL DATA:  Unresponsive, unable to follow commands, abdominal distension, history cirrhosis, diabetes mellitus, hypertension, COPD, hepatic encephalopathy  EXAM: PORTABLE CHEST - 1 VIEW  COMPARISON:  Portable exam 1236 hours compared to 09/18/2014  FINDINGS: Decreased lung volumes.  Upper normal heart size.  Normal mediastinal contours and pulmonary vascularity.  Minimal RIGHT basilar atelectasis.  Lungs otherwise clear.  No pleural effusion or pneumothorax.  Bones unremarkable.  IMPRESSION: Minimal RIGHT basilar atelectasis.   Electronically Signed   By: Lavonia Dana M.D.   On: 11/10/2014 13:10    CBC  Recent Labs Lab 11/10/14 1230 11/11/14 0322  WBC 3.7* 4.8  HGB 9.5* 8.4*  HCT 27.2* 24.4*  PLT 64* 55*  MCV 92.8 95.3  MCH 32.4 32.8  MCHC 34.9 34.4  RDW 19.5* 19.8*    Chemistries   Recent Labs Lab 11/10/14 1230 11/11/14 0322  NA 130* 131*  K 5.6* 4.5  CL 103 105  CO2 21* 21*  GLUCOSE 98 97  BUN 22* 24*  CREATININE 1.12 1.07  CALCIUM 8.1* 7.7*  AST 96* 72*  ALT 52 42  ALKPHOS 121 98  BILITOT 2.8* 3.1*   ------------------------------------------------------------------------------------------------------------------ estimated creatinine clearance is 87.2 mL/min (by C-G formula based on Cr of  1.07). ------------------------------------------------------------------------------------------------------------------ No results for input(s): HGBA1C in the last 72 hours. ------------------------------------------------------------------------------------------------------------------ No results for input(s): CHOL, HDL, LDLCALC, TRIG, CHOLHDL, LDLDIRECT in the last 72 hours. ------------------------------------------------------------------------------------------------------------------ No results for input(s): TSH, T4TOTAL, T3FREE, THYROIDAB in the last 72 hours.  Invalid input(s): FREET3 ------------------------------------------------------------------------------------------------------------------ No results for input(s): VITAMINB12, FOLATE, FERRITIN, TIBC, IRON, RETICCTPCT in the last 72 hours.  Coagulation profile  Recent Labs Lab 11/10/14 1230  INR 1.59*    No results for input(s): DDIMER in the last 72 hours.  Cardiac  Enzymes No results for input(s): CKMB, TROPONINI, MYOGLOBIN in the last 168 hours.  Invalid input(s): CK ------------------------------------------------------------------------------------------------------------------ Invalid input(s): POCBNP    Mar Walmer D.O. on 11/11/2014 at 12:10 PM  Between 7am to 7pm - Pager - [phone]  After 7pm go to [email or web address] - password [email or web address]  And look for the night coverage person covering for me after hours  [facility] Office  [phone]

## 2014-11-12 DIAGNOSIS — D649 Anemia, unspecified: Secondary | ICD-10-CM

## 2014-11-12 LAB — IRON AND TIBC
Iron: 72 ug/dL (ref 45–182)
Saturation Ratios: 32 % (ref 17.9–39.5)
TIBC: 223 ug/dL — ABNORMAL LOW (ref 250–450)
UIBC: 151 ug/dL

## 2014-11-12 LAB — FOLATE: Folate: 30.3 ng/mL (ref >5.9)

## 2014-11-12 LAB — COMPREHENSIVE METABOLIC PANEL
ALBUMIN: 2 g/dL — AB (ref 3.5–5.0)
ALT: 40 U/L (ref 17–63)
ANION GAP: 4 — AB (ref 5–15)
AST: 69 U/L — AB (ref 15–41)
Alkaline Phosphatase: 97 U/L (ref 38–126)
BUN: 22 mg/dL — ABNORMAL HIGH (ref 6–20)
CO2: 23 mmol/L (ref 22–32)
Calcium: 7.9 mg/dL — ABNORMAL LOW (ref 8.9–10.3)
Chloride: 105 mmol/L (ref 101–111)
Creatinine, Ser: 1.1 mg/dL (ref 0.61–1.24)
GFR calc Af Amer: >60 mL/min (ref >60)
GFR calc non Af Amer: >60 mL/min (ref >60)
Glucose, Bld: 84 mg/dL (ref 65–99)
Potassium: 4 mmol/L (ref 3.5–5.1)
Sodium: 132 mmol/L — ABNORMAL LOW (ref 135–145)
TOTAL PROTEIN: 4.8 g/dL — AB (ref 6.5–8.1)
Total Bilirubin: 2.5 mg/dL — ABNORMAL HIGH (ref 0.3–1.2)

## 2014-11-12 LAB — CBC
HEMATOCRIT: 24.1 % — AB (ref 39.0–52.0)
Hemoglobin: 8.2 g/dL — ABNORMAL LOW (ref 13.0–17.0)
MCH: 32 pg (ref 26.0–34.0)
MCHC: 34 g/dL (ref 30.0–36.0)
MCV: 94.1 fL (ref 78.0–100.0)
Platelets: 61 10*3/uL — ABNORMAL LOW (ref 150–400)
RBC: 2.56 MIL/uL — ABNORMAL LOW (ref 4.22–5.81)
RDW: 19.5 % — AB (ref 11.5–15.5)
WBC: 4.4 10*3/uL (ref 4.0–10.5)

## 2014-11-12 LAB — RETICULOCYTES
RBC.: 2.56 MIL/uL — ABNORMAL LOW (ref 4.22–5.81)
Retic Count, Absolute: 89.6 10*3/uL (ref 19.0–186.0)
Retic Ct Pct: 3.5 % — ABNORMAL HIGH (ref 0.4–3.1)

## 2014-11-12 LAB — GLUCOSE, CAPILLARY
GLUCOSE-CAPILLARY: 86 mg/dL (ref 65–99)
GLUCOSE-CAPILLARY: 140 mg/dL — AB (ref 65–99)
Glucose-Capillary: 125 mg/dL — ABNORMAL HIGH (ref 65–99)
Glucose-Capillary: 66 mg/dL (ref 65–99)
Glucose-Capillary: 83 mg/dL (ref 65–99)
Glucose-Capillary: 145 mg/dL — ABNORMAL HIGH (ref 65–99)
Glucose-Capillary: 122 mg/dL — ABNORMAL HIGH (ref 65–99)
Glucose-Capillary: 146 mg/dL — ABNORMAL HIGH (ref 65–99)

## 2014-11-12 LAB — VITAMIN B12: VITAMIN B 12: 1247 pg/mL — AB (ref 180–914)

## 2014-11-12 LAB — PATHOLOGIST SMEAR REVIEW

## 2014-11-12 LAB — AMMONIA: Ammonia: 39 umol/L — ABNORMAL HIGH (ref 9–35)

## 2014-11-12 LAB — AMYLASE, PERITONEAL FLUID: Amylase, peritoneal fluid: <5 U/L

## 2014-11-12 LAB — FERRITIN: Ferritin: 37 ng/mL (ref 24–336)

## 2014-11-12 MED ORDER — OXYCODONE-ACETAMINOPHEN 5-325 MG PO TABS: 2.0000 | ORAL_TABLET | Freq: Once | ORAL | Status: DC

## 2014-11-12 MED ORDER — ENSURE ENLIVE PO LIQD
237.0000 mL | Freq: Two times a day (BID) | ORAL | Status: DC
  Administered 2014-11-13: 237 mL via ORAL

## 2014-11-12 MED ORDER — PANTOPRAZOLE SODIUM 40 MG PO PACK
40.0000 mg | PACK | Freq: Every day | ORAL | Status: DC
  Administered 2014-11-12: 40 mg
  Filled 2014-11-12 (×3): qty 20

## 2014-11-12 MED ORDER — OXYCODONE-ACETAMINOPHEN 5-325 MG PO TABS: 1.0000 | ORAL_TABLET | Freq: Four times a day (QID) | ORAL | Status: DC | PRN

## 2014-11-12 MED ORDER — OXYCODONE-ACETAMINOPHEN 5-325 MG PO TABS
2.0000 | ORAL_TABLET | Freq: Once | ORAL | Status: AC
  Administered 2014-11-12: 2 via ORAL
  Filled 2014-11-12: qty 2

## 2014-11-12 MED ORDER — OXYCODONE HCL 5 MG PO TABS: 10.0000 mg | ORAL_TABLET | Freq: Once | ORAL | Status: DC

## 2014-11-12 NOTE — Progress Notes (Signed)
[facility]                                                                              Patient Demographics  [name], is a 59 y.o. male, DOB - 1955/12/24, SWN:[id number]  Admit date - 11/10/2014   Admitting Physician Domenic Polite, MD  Outpatient Primary MD for the patient is Barbette Merino, MD  LOS - 2   Chief Complaint  Patient presents with  . Altered Mental Status      HPI on 11/10/2014 by Dr. Domenic Polite [name] is a 59 y.o. male with past medical history of alcohol abuse, cirrhosis, ascites, recurrent admissions for hepatic encephalopathy, history of noncompliance, polysubstance abuse, chronic diastolic CHF, type 2 diabetes is here in the ER of the above complaints. Patient unable to provide any history due to mentation at this time, apparently EMS cart called home at 11:00 this morning for elevated blood sugar, he was found to be hyperglycemic and was given 8 units of insulin, he was initially talkative but lethargic when EMS arrived however subsequently became poorly responsive on route. His mentation is slowly started to improve in the emergency room, still very lethargic but arousable, answers yes or no to a few questions, an NG tube was placed in the emergency room. His ammonia level was significantly elevated at 200, potassium is 5.6  Assessment & Plan   Hepatic encephalopathy -Long history of noncompliance with medication  -Improved.  Spoke with patient, he does admit to being noncompliant with lactulose -NG tube removed on 11/11/2014 -Ammonia improved from 228 (upon admission) to 39 -CT head: no acute intracranial abnormalities -Tox screen negative -PT/OT consulted  -Case management consulted for medication needs/home health  Hyperkalemia -Resolved, Likely secondary to Aldactone -Patient was given Kayexalate for the NG tube upon admission -Aldactone held  Alcoholic cirrhosis with ascites/Elevated transaminases -Hold diuretics at this  time -Continue propranolol -11/11/2014 Paracentesis - removed 5.8L  Diabetes mellitus -continue Lantus, sliding scale insulin, CBG monitoring  Normocytic Anemia /Thrombocytopenia -Due to splenic sequestration from ascites -Hb baseline aaround 10, currently 8.2.  (possible dilutional component) -platelets 61 -Continue to monitor CBC  Bipolar affect, depressed -Continue Klonopin at low-dose went on mentation improved   Code Status: Full  Family Communication: None at bedside  Disposition Plan: Admitted.  Mental status improved.  Will move to medical floor.  PT/OT consulted. Likely d/c 6/23.  Time Spent in minutes   30 minutes  Procedures  None  Consults   None  DVT Prophylaxis  SCDs  Lab Results  Component Value Date   PLT 61* 11/12/2014    Medications  Scheduled Meds: . antiseptic oral rinse  7 mL Mouth Rinse BID  . insulin aspart  0-9 Units Subcutaneous TID WC  . insulin glargine  30 Units Subcutaneous QHS  . lactulose  30 g Per Tube TID  . pantoprazole (PROTONIX) IV  40 mg Intravenous Q24H  . propranolol  20 mg Per NG tube BID  . rifaximin  550 mg Per Tube BID   Continuous Infusions:  PRN Meds:.  Antibiotics    Anti-infectives    Start     Dose/Rate Route Frequency Ordered Stop   11/10/14  2200  rifaximin (XIFAXAN) tablet 550 mg     550 mg Per Tube 2 times daily 11/10/14 1842        Subjective:   [name] seen and examined today.  Patient continues to be confused.  Knows his name, not DOB.    Objective:   Filed Vitals:   11/12/14 0400 11/12/14 0732 11/12/14 0800 11/12/14 0949  BP: 120/66 114/62 103/54 103/54  Pulse: 70 59 58 58  Temp: 98.2 F (36.8 C) 98.2 F (36.8 C)    TempSrc: Axillary Oral    Resp: 17 16 18    Height:      Weight:      SpO2: 96% 99% 100%     Wt Readings from Last 3 Encounters:  11/10/14 102.2 kg (225 lb 5 oz)  10/06/14 99.338 kg (219 lb)  09/03/14 102.4 kg (225 lb 12 oz)     Intake/Output Summary (Last 24  hours) at 11/12/14 1106 Last data filed at 11/12/14 0954  Gross per 24 hour  Intake    850 ml  Output    425 ml  Net    425 ml    Exam  General: Well developed, well nourished, No disress  HEENT: NCAT, mucous membranes moist.   Cardiovascular: S1 S2 auscultated, RRR  Respiratory: Diminished breath sounds, clear  Abdomen: Soft, nontender, mildly distended, + bowel sounds  Extremities: warm dry without cyanosis clubbing. 3+ edema LE B/L  Neuro: AAOx3, nonfocal  Data Review   Micro Results Recent Results (from the past 240 hour(s))  MRSA PCR Screening     Status: None   Collection Time: 11/10/14  6:53 PM  Result Value Ref Range Status   MRSA by PCR NEGATIVE NEGATIVE Final    Comment:        The GeneXpert MRSA Assay (FDA approved for NASAL specimens only), is one component of a comprehensive MRSA colonization surveillance program. It is not intended to diagnose MRSA infection nor to guide or monitor treatment for MRSA infections.   Gram stain     Status: None   Collection Time: 11/11/14  2:25 PM  Result Value Ref Range Status   Specimen Description FLUID ASCITIC  Final   Special Requests NONE  Final   Gram Stain   Final    CYTOSPIN SMEAR WBC PRESENT,BOTH PMN AND MONONUCLEAR NO ORGANISMS SEEN    Report Status 11/11/2014 FINAL  Final    Radiology Reports Ct Head Wo Contrast  11/10/2014   CLINICAL DATA:  Altered mental status, hyperglycemia, unresponsive, history diabetes mellitus, cirrhosis, hepatic encephalopathy, COPD, prior stroke, hypertension  EXAM: CT HEAD WITHOUT CONTRAST  TECHNIQUE: Contiguous axial images were obtained from the base of the skull through the vertex without intravenous contrast.  COMPARISON:  08/29/2014  FINDINGS: Mild generalized atrophy.  Normal ventricular morphology.  No midline shift or mass effect.  Otherwise normal appearance of brain parenchyma.  No intracranial hemorrhage, mass lesion, or acute infarction.  Visualized paranasal  sinuses clear.  Bones unremarkable.  IMPRESSION: No acute intracranial abnormalities.   Electronically Signed   By: Lavonia Dana M.D.   On: 11/10/2014 13:42   US Paracentesis  11/11/2014   INDICATION: ascites  EXAM: ULTRASOUND-GUIDED PARACENTESIS  COMPARISON:  Previous paracentesis  MEDICATIONS: 10 cc 1% lidocaine  COMPLICATIONS: None immediate  TECHNIQUE: Informed written consent was obtained from the patient after a discussion of the risks, benefits and alternatives to treatment. A timeout was performed prior to the initiation of the procedure.  Initial ultrasound  scanning demonstrates a large amount of ascites within the left lower abdominal quadrant. The left lower abdomen was prepped and draped in the usual sterile fashion. 1% lidocaine with epinephrine was used for local anesthesia. Under direct ultrasound guidance, a 19 gauge, 7-cm, Yueh catheter was introduced. An ultrasound image was saved for documentation purposed. The paracentesis was performed. The catheter was removed and a dressing was applied. The patient tolerated the procedure well without immediate post procedural complication.  FINDINGS: A total of approximately 5.8 liters of yellow fluid was removed. Samples were sent to the laboratory as requested by the clinical team.  IMPRESSION: Successful ultrasound-guided paracentesis yielding 5.8 liters of peritoneal fluid.  Read by:  Lavonia Drafts [facility]   Electronically Signed   By: Jerilynn Mages.  Shick M.D.   On: 11/11/2014 15:01   US Paracentesis  10/31/2014   INDICATION: Recurrent ascites and request for therapeutic paracentesis.  EXAM: ULTRASOUND-GUIDED PARACENTESIS  COMPARISON:  10/15/14 paracentesis.  MEDICATIONS: None.  COMPLICATIONS: None immediate  TECHNIQUE: Informed written consent was obtained from the patient after a discussion of the risks, benefits and alternatives to treatment. A timeout was performed prior to the initiation of the procedure.  Initial ultrasound scanning demonstrates a moderate  to large amount of ascites within the left lower abdominal quadrant. The left lower abdomen was prepped and draped in the usual sterile fashion. 1% lidocaine was used for local anesthesia.  An ultrasound image was saved for documentation purposed. A 6 Fr Safe-T-Centesis catheter was introduced. The paracentesis was performed. The catheter was removed and a dressing was applied. The patient tolerated the procedure well without immediate post procedural complication.  FINDINGS: A total of approximately 3.8 liters of serous fluid was removed.  IMPRESSION: Successful ultrasound-guided paracentesis yielding 3.8 liters of peritoneal fluid.  Read By:  Tsosie Billing PA-C   Electronically Signed   By: Jacqulynn Cadet M.D.   On: 10/31/2014 11:18   US Paracentesis  10/15/2014   CLINICAL DATA:  ascites  EXAM: ULTRASOUND GUIDED  PARACENTESIS  COMPARISON:  None.  PROCEDURE: An ultrasound guided paracentesis was thoroughly discussed with the patient and questions answered. The benefits, risks, alternatives and complications were also discussed. The patient understands and wishes to proceed with the procedure. Written consent was obtained.  Ultrasound was performed to localize and mark an adequate pocket of fluid in the right quadrant of the abdomen. The area was then prepped and draped in the normal sterile fashion. 1% Lidocaine was used for local anesthesia. Under ultrasound guidance a 19 gauge Yueh catheter was introduced. Paracentesis was performed. The catheter was removed and a dressing applied.  COMPLICATIONS: None.  FINDINGS: A total of approximately 3.4 Liters of clear yellow fluid was removed. A fluid sample was not sent for laboratory analysis.  IMPRESSION: Successful ultrasound guided paracentesis yielding 3.4 liters of ascites.  Read by:  Gareth Eagle, PA-C   Electronically Signed   By: Aletta Edouard M.D.   On: 10/15/2014 15:29   Dg Chest Port 1 View  11/10/2014   CLINICAL DATA:  Status post nasogastric  catheter placement  EXAM: PORTABLE CHEST - 1 VIEW  COMPARISON:  11/10/2014  FINDINGS: Cardiac shadow is stable. Mild right basilar atelectasis is again identified. The overall inspiratory effort is poor. A nasogastric catheter is noted extending into the stomach. No other focal abnormality is noted.  IMPRESSION: Nasogastric catheter within the stomach.   Electronically Signed   By: Inez Catalina M.D.   On: 11/10/2014 15:51   Dg  Chest Port 1 View  11/10/2014   CLINICAL DATA:  Unresponsive, unable to follow commands, abdominal distension, history cirrhosis, diabetes mellitus, hypertension, COPD, hepatic encephalopathy  EXAM: PORTABLE CHEST - 1 VIEW  COMPARISON:  Portable exam 1236 hours compared to 09/18/2014  FINDINGS: Decreased lung volumes.  Upper normal heart size.  Normal mediastinal contours and pulmonary vascularity.  Minimal RIGHT basilar atelectasis.  Lungs otherwise clear.  No pleural effusion or pneumothorax.  Bones unremarkable.  IMPRESSION: Minimal RIGHT basilar atelectasis.   Electronically Signed   By: Lavonia Dana M.D.   On: 11/10/2014 13:10    CBC  Recent Labs Lab 11/10/14 1230 11/11/14 0322 11/12/14 0307  WBC 3.7* 4.8 4.4  HGB 9.5* 8.4* 8.2*  HCT 27.2* 24.4* 24.1*  PLT 64* 55* 61*  MCV 92.8 95.3 94.1  MCH 32.4 32.8 32.0  MCHC 34.9 34.4 34.0  RDW 19.5* 19.8* 19.5*    Chemistries   Recent Labs Lab 11/10/14 1230 11/11/14 0322 11/12/14 0307  NA 130* 131* 132*  K 5.6* 4.5 4.0  CL 103 105 105  CO2 21* 21* 23  GLUCOSE 98 97 84  BUN 22* 24* 22*  CREATININE 1.12 1.07 1.10  CALCIUM 8.1* 7.7* 7.9*  AST 96* 72* 69*  ALT 52 42 40  ALKPHOS 121 98 97  BILITOT 2.8* 3.1* 2.5*   ------------------------------------------------------------------------------------------------------------------ estimated creatinine clearance is 84.8 mL/min (by C-G formula based on Cr of  1.1). ------------------------------------------------------------------------------------------------------------------ No results for input(s): HGBA1C in the last 72 hours. ------------------------------------------------------------------------------------------------------------------ No results for input(s): CHOL, HDL, LDLCALC, TRIG, CHOLHDL, LDLDIRECT in the last 72 hours. ------------------------------------------------------------------------------------------------------------------ No results for input(s): TSH, T4TOTAL, T3FREE, THYROIDAB in the last 72 hours.  Invalid input(s): FREET3 ------------------------------------------------------------------------------------------------------------------  Recent Labs  11/12/14 0307  VITAMINB12 1247*  FOLATE 30.3  FERRITIN 37  TIBC 223*  IRON 72  RETICCTPCT 3.5*    Coagulation profile  Recent Labs Lab 11/10/14 1230  INR 1.59*    No results for input(s): DDIMER in the last 72 hours.  Cardiac Enzymes No results for input(s): CKMB, TROPONINI, MYOGLOBIN in the last 168 hours.  Invalid input(s): CK ------------------------------------------------------------------------------------------------------------------ Invalid input(s): POCBNP    Damyiah Moxley D.O. on 11/12/2014 at 11:06 AM  Between 7am to 7pm - Pager - [phone]  After 7pm go to [email or web address] - password [email or web address]  And look for the night coverage person covering for me after hours  [facility] Office  [phone]

## 2014-11-12 NOTE — Care Management Note (Signed)
Case Management Note  Patient Details  Name: [name] MRN: [id number] Date of Birth: [birth year]  Subjective/Objective:   Pt lives alone, girlfriend cleans his apartment and grocery shops for him.  Pt states his brother is in the process of selling his property in [place] so he can move in with pt.  Pt is active with [name] for home health nursing.  Pt is on Xifaxan, has recently been approved for Medicaid so will be able to purchase med for $3.00.

## 2014-11-12 NOTE — Progress Notes (Signed)
Hypoglycemic Event  CBG: 66  Treatment: orange juice/crackers  Symptoms: pt said he is hungry, a lilttle anxious  Follow-up CBG Time:  0814     CBG Result:  83  Possible Reasons for Event: did not eat snacks Comments/MD notified: MD was paged    [name]  Remember to initiate Hypoglycemia Order Set & complete

## 2014-11-13 LAB — CBC
HEMATOCRIT: 24.9 % — AB (ref 39.0–52.0)
Hemoglobin: 8.5 g/dL — ABNORMAL LOW (ref 13.0–17.0)
MCH: 32.3 pg (ref 26.0–34.0)
MCHC: 34.1 g/dL (ref 30.0–36.0)
MCV: 94.7 fL (ref 78.0–100.0)
PLATELETS: 59 10*3/uL — AB (ref 150–400)
RBC: 2.63 MIL/uL — ABNORMAL LOW (ref 4.22–5.81)
RDW: 19.4 % — AB (ref 11.5–15.5)
WBC: 4 10*3/uL (ref 4.0–10.5)

## 2014-11-13 LAB — COMPREHENSIVE METABOLIC PANEL
ALT: 40 U/L (ref 17–63)
ANION GAP: 5 (ref 5–15)
AST: 73 U/L — ABNORMAL HIGH (ref 15–41)
Albumin: 2 g/dL — ABNORMAL LOW (ref 3.5–5.0)
Alkaline Phosphatase: 104 U/L (ref 38–126)
BILIRUBIN TOTAL: 1.9 mg/dL — AB (ref 0.3–1.2)
BUN: 18 mg/dL (ref 6–20)
CALCIUM: 7.9 mg/dL — AB (ref 8.9–10.3)
CO2: 23 mmol/L (ref 22–32)
Chloride: 105 mmol/L (ref 101–111)
Creatinine, Ser: 0.95 mg/dL (ref 0.61–1.24)
GFR calc non Af Amer: >60 mL/min (ref >60)
GLUCOSE: 136 mg/dL — AB (ref 65–99)
Potassium: 4.3 mmol/L (ref 3.5–5.1)
Sodium: 133 mmol/L — ABNORMAL LOW (ref 135–145)
Total Protein: 4.9 g/dL — ABNORMAL LOW (ref 6.5–8.1)

## 2014-11-13 LAB — AMMONIA: AMMONIA: 87 umol/L — AB (ref 9–35)

## 2014-11-13 LAB — GLUCOSE, CAPILLARY
GLUCOSE-CAPILLARY: 133 mg/dL — AB (ref 65–99)
Glucose-Capillary: 89 mg/dL (ref 65–99)

## 2014-11-13 MED ORDER — ENSURE ENLIVE PO LIQD: 237.0000 mL | Freq: Two times a day (BID) | ORAL | Status: DC

## 2014-11-13 NOTE — Evaluation (Addendum)
Physical Therapy Evaluation Patient Details Name: [name] MRN: [id number] DOB: [birth year] Today's Date: 11/13/2014   History of Present Illness  [name] is a 59 y.o. male with past medical history of alcohol abuse, cirrhosis, ascites, recurrent admissions for hepatic encephalopathy, history of noncompliance, polysubstance abuse, chronic diastolic CHF, type 2 diabetes is here in the ER for AMS.  Clinical Impression  Pt doing well with mobility and no further PT needed.  Ready for dc from PT standpoint. Pt wore shoes during evaluation which he didn't have on during OT eval which likely improved his stability.      Follow Up Recommendations No PT follow up    Equipment Recommendations  None recommended by PT    Recommendations for Other Services       Precautions / Restrictions Precautions Precautions: None Restrictions Weight Bearing Restrictions: No      Mobility  Bed Mobility Overal bed mobility: Modified Independent             General bed mobility comments: extra time and using bed rails  Transfers Overall transfer level: Modified independent Equipment used: None Transfers: Sit to/from Stand Sit to Stand: Modified independent (Device/Increase time)         General transfer comment: Pt unsafe with use of RW, can benefit from RW safety education.   Ambulation/Gait Ambulation/Gait assistance: Modified independent (Device/Increase time) Ambulation Distance (Feet): 250 Feet Assistive device: None Gait Pattern/deviations: WFL(Within Functional Limits)     General Gait Details: Pt had shoes on during treatment which he didn't have on during OT. Steady gait without use of walker.  Stairs            Wheelchair Mobility    Modified Rankin (Stroke Patients Only)       Balance Overall balance assessment: Needs assistance Sitting-balance support: No upper extremity supported;Feet supported Sitting balance-Leahy Scale: Good     Standing  balance support: No upper extremity supported;During functional activity Standing balance-Leahy Scale: Good                               Pertinent Vitals/Pain Pain Assessment: No/denies pain    Home Living Family/patient expects to be discharged to:: Private residence Living Arrangements: Alone (Pt reports brother plans to move in with him) Available Help at Discharge: Family;Personal care attendant (Finance and brother, per pt report) Type of Home: Apartment Home Access: Level entry     Home Layout: One level Home Equipment: Environmental consultant - 2 wheels;Bedside commode;Tub bench;Hand held shower head Additional Comments: patient states that he lives by himself and that he has no steps to get in, he can just walk through the laundry room    Prior Function Level of Independence: Independent with assistive device(s);Needs assistance   Gait / Transfers Assistance Needed: Uses RW vs cane (cane for house hold mobility and RW for community mobility)  ADL's / Homemaking Assistance Needed: mod I, uses bench and BSC beside bed)  Comments: Pt states that he has fallen 4 times in last several months but never with RW.       Hand Dominance   Dominant Hand: Right    Extremity/Trunk Assessment   Upper Extremity Assessment: Defer to OT evaluation           Lower Extremity Assessment: Overall WFL for tasks assessed      Cervical / Trunk Assessment: Normal  Communication   Communication: No difficulties  Cognition Arousal/Alertness: Awake/alert Behavior  During Therapy: WFL for tasks assessed/performed Overall Cognitive Status: Within Functional Limits for tasks assessed                      General Comments      Exercises        Assessment/Plan    PT Assessment Patent does not need any further PT services  PT Diagnosis Difficulty walking   PT Problem List    PT Treatment Interventions     PT Goals (Current goals can be found in the Care Plan section)  Acute Rehab PT Goals Patient Stated Goal: go home today! PT Goal Formulation: All assessment and education complete, DC therapy    Frequency     Barriers to discharge        Co-evaluation               End of Session   Activity Tolerance: Patient tolerated treatment well Patient left: in chair;with call bell/phone within reach           Time: 1155-1203 PT Time Calculation (min) (ACUTE ONLY): 8 min   Charges:   PT Evaluation $Initial PT Evaluation Tier I: 1 Procedure     PT G Codes:        [name] 2014/11/30, 1:31 PM  [facility] PT [phone]

## 2014-11-13 NOTE — Progress Notes (Signed)
Pt. Received discharge instructions. Educated pt. On follow-up appointments and stressed the importance of taking lactulose as scheduled and to put down the alcohol. Removed IV. No new skin issues noted. All questions answered. No further needs noted at this time.

## 2014-11-13 NOTE — Care Management Note (Signed)
Case Management Note  Patient Details  Name: [name] MRN: [id number] Date of Birth: [birth year]  Subjective/Objective:       Patient for dc today, will resume HHRN and HHPT with [facility].  [place] notified with Alvis Lemmings that patient is for dc today.             Action/Plan:   Expected Discharge Date:                  Expected Discharge Plan:  [place]  In-House Referral:     Discharge planning Services  CM Consult  Post Acute Care Choice:  Home Health, Resumption of Svcs/PTA Provider Choice offered to:  Patient  DME Arranged:    DME Agency:     HH Arranged:  RN, PT [place] Agency:  [facility]  Status of Service:  Completed, signed off  Medicare Important Message Given:    Date Medicare IM Given:  11/13/14 Medicare IM give by:  Tomi Bamberger RN Date Additional Medicare IM Given:    Additional Medicare Important Message give by:     If discussed at [place] of Stay Meetings, dates discussed:    Additional Comments:  Zenon Mayo, RN 11/13/2014, 12:32 PM

## 2014-11-13 NOTE — Discharge Summary (Signed)
Physician Discharge Summary  [name] ATF:[id number] DOB: 1955/06/09 DOA: 11/10/2014  PCP: Barbette Merino, MD  Admit date: 11/10/2014 Discharge date: 11/13/2014  Time spent: 45 minutes  Recommendations for Outpatient Follow-up:  Patient will be discharged to home.  Patient will need to follow up with primary care provider within one week of discharge and have repeat CBC, CMP, lipase.   Patient should continue medications as prescribed.  Patient should follow a carb modified diet.    Discharge Diagnoses:  Hepatic encephalopathy Hyperkalemia Alcoholic cirrhosis with ascites/elevated transaminases Diabetes mellitus Normocytic anemia/thrombocytopenia Bipolar affect/depression  Discharge Condition: Stable  Diet recommendation: carb modified  Filed Weights   11/10/14 2010  Weight: 102.2 kg (225 lb 5 oz)    History of present illness:  on 11/10/2014 by Dr. Domenic Polite [name] is a 59 y.o. male with past medical history of alcohol abuse, cirrhosis, ascites, recurrent admissions for hepatic encephalopathy, history of noncompliance, polysubstance abuse, chronic diastolic CHF, type 2 diabetes is here in the ER of the above complaints. Patient unable to provide any history due to mentation at this time, apparently EMS cart called home at 11:00 this morning for elevated blood sugar, he was found to be hyperglycemic and was given 8 units of insulin, he was initially talkative but lethargic when EMS arrived however subsequently became poorly responsive on route. His mentation is slowly started to improve in the emergency room, still very lethargic but arousable, answers yes or no to a few questions, an NG tube was placed in the emergency room. His ammonia level was significantly elevated at 200, potassium is 5.6  Hospital Course:  Hepatic encephalopathy -Long history of noncompliance with medication  -Improved. Spoke with patient, he does admit to being noncompliant with lactulose  but states he will "do better." -NG tube removed on 11/11/2014 -Ammonia improved from 228 (upon admission) to 87 -CT head: no acute intracranial abnormalities -Tox screen negative -PT/OT consultedand patient does not feed further therapy -Case management consulted for medication needs/home health  Hyperkalemia -Resolved, Likely secondary to Aldactone -Patient was given Kayexalate for the NG tube upon admission -Aldactone held- resume upon discharge  Alcoholic cirrhosis with ascites/Elevated transaminases -Hold diuretics at this time, resume upon discharge -Continue propranolol -11/11/2014 Paracentesis - removed 5.8L  Diabetes mellitus -continue Lantus, sliding scale insulin, CBG monitoring  Normocytic Anemia /Thrombocytopenia -Due to splenic sequestration from ascites -Hb baseline aaround 10, currently stable 8.5. (possible dilutional component) -platelets 59  Bipolar affect, depressed -Continue Klonopin   Procedures  Paracentesis  Consults  None  Discharge Exam: Filed Vitals:   11/13/14 1043  BP: 106/75  Pulse: 63  Temp:   Resp:    Exam  General: Well developed, well nourished, No disress  HEENT: NCAT, mucous membranes moist.   Cardiovascular: S1 S2 auscultated, RRR  Respiratory: Diminished breath sounds, clear  Abdomen: Soft, nontender, mildly distended, + bowel sounds  Extremities: warm dry without cyanosis clubbing. 3+ edema LE B/L  Neuro: AAOx3, nonfocal  Discharge Instructions      Discharge Instructions    Discharge instructions    Complete by:  As directed   Patient will be discharged to home.  Patient will need to follow up with primary care provider within one week of discharge and have repeat CBC, CMP, lipase.   Patient should continue medications as prescribed.  Patient should follow a carb modified diet.            Medication List    TAKE these medications  amitriptyline 25 MG tablet  Commonly known as:  ELAVIL  Take  1 tablet (25 mg total) by mouth at bedtime.     CALCIUM-VITAMIN D PO  Take 100 Units by mouth daily.     clonazePAM 0.25 MG disintegrating tablet  Commonly known as:  KLONOPIN  Take 0.25 mg by mouth 2 (two) times daily.     diazepam 5 MG tablet  Commonly known as:  VALIUM  TAKE 1 TABLET TWICE A DAY     diclofenac sodium 1 % Gel  Commonly known as:  VOLTAREN  Apply 4 g topically as needed (for back of leg pain).     feeding supplement (ENSURE ENLIVE) Liqd  Take 237 mLs by mouth 2 (two) times daily between meals.     folic acid 1 MG tablet  Commonly known as:  FOLVITE  Take 1 tablet (1 mg total) by mouth daily.     furosemide 40 MG tablet  Commonly known as:  LASIX  Take 40 mg by mouth daily.     insulin aspart 100 UNIT/ML FlexPen  Commonly known as:  NOVOLOG FLEXPEN  Inject 6 Units into the skin 3 (three) times daily with meals. For diabetes     insulin glargine 100 UNIT/ML injection  Commonly known as:  LANTUS  Inject 45 Units into the skin at bedtime.     lactulose 10 GM/15ML solution  Commonly known as:  CHRONULAC  Take 45 mLs (30 g total) by mouth 3 (three) times daily.     lisinopril 20 MG tablet  Commonly known as:  PRINIVIL,ZESTRIL  Take 20 mg by mouth daily.     magnesium oxide 400 (241.3 MG) MG tablet  Commonly known as:  MAG-OX  Take 1 tablet (400 mg total) by mouth 2 (two) times daily.     multivitamin with minerals Tabs tablet  Take 1 tablet by mouth daily.     oxyCODONE 5 MG immediate release tablet  Commonly known as:  Oxy IR/ROXICODONE  Take 5 mg by mouth every 8 (eight) hours.     pantoprazole 40 MG tablet  Commonly known as:  PROTONIX  Take 40 mg by mouth daily.     propranolol 20 MG tablet  Commonly known as:  INDERAL  Take 20 mg by mouth 2 (two) times daily.     spironolactone 25 MG tablet  Commonly known as:  ALDACTONE  Take 25 mg by mouth 3 (three) times daily.     XIFAXAN 550 MG Tabs tablet  Generic drug:  rifaximin  Take 550  mg by mouth 2 (two) times daily.       No Known Allergies Follow-up Information    Follow up with GARBA,LAWAL, MD. Schedule an appointment as soon as possible for a visit in 1 week.   Specialty:  Internal Medicine   Why:  Hospital follow up   Contact information:   [address]. [place] [address] [phone]        The results of significant diagnostics from this hospitalization (including imaging, microbiology, ancillary and laboratory) are listed below for reference.    Significant Diagnostic Studies: Ct Head Wo Contrast  11/10/2014   CLINICAL DATA:  Altered mental status, hyperglycemia, unresponsive, history diabetes mellitus, cirrhosis, hepatic encephalopathy, COPD, prior stroke, hypertension  EXAM: CT HEAD WITHOUT CONTRAST  TECHNIQUE: Contiguous axial images were obtained from the base of the skull through the vertex without intravenous contrast.  COMPARISON:  08/29/2014  FINDINGS: Mild generalized atrophy.  Normal ventricular morphology.  No midline shift or mass effect.  Otherwise normal appearance of brain parenchyma.  No intracranial hemorrhage, mass lesion, or acute infarction.  Visualized paranasal sinuses clear.  Bones unremarkable.  IMPRESSION: No acute intracranial abnormalities.   Electronically Signed   By: Lavonia Dana M.D.   On: 11/10/2014 13:42   US Paracentesis  11/11/2014   INDICATION: ascites  EXAM: ULTRASOUND-GUIDED PARACENTESIS  COMPARISON:  Previous paracentesis  MEDICATIONS: 10 cc 1% lidocaine  COMPLICATIONS: None immediate  TECHNIQUE: Informed written consent was obtained from the patient after a discussion of the risks, benefits and alternatives to treatment. A timeout was performed prior to the initiation of the procedure.  Initial ultrasound scanning demonstrates a large amount of ascites within the left lower abdominal quadrant. The left lower abdomen was prepped and draped in the usual sterile fashion. 1% lidocaine with epinephrine was used for local  anesthesia. Under direct ultrasound guidance, a 19 gauge, 7-cm, Yueh catheter was introduced. An ultrasound image was saved for documentation purposed. The paracentesis was performed. The catheter was removed and a dressing was applied. The patient tolerated the procedure well without immediate post procedural complication.  FINDINGS: A total of approximately 5.8 liters of yellow fluid was removed. Samples were sent to the laboratory as requested by the clinical team.  IMPRESSION: Successful ultrasound-guided paracentesis yielding 5.8 liters of peritoneal fluid.  Read by:  Lavonia Drafts [facility]   Electronically Signed   By: Jerilynn Mages.  Shick M.D.   On: 11/11/2014 15:01   US Paracentesis  10/31/2014   INDICATION: Recurrent ascites and request for therapeutic paracentesis.  EXAM: ULTRASOUND-GUIDED PARACENTESIS  COMPARISON:  10/15/14 paracentesis.  MEDICATIONS: None.  COMPLICATIONS: None immediate  TECHNIQUE: Informed written consent was obtained from the patient after a discussion of the risks, benefits and alternatives to treatment. A timeout was performed prior to the initiation of the procedure.  Initial ultrasound scanning demonstrates a moderate to large amount of ascites within the left lower abdominal quadrant. The left lower abdomen was prepped and draped in the usual sterile fashion. 1% lidocaine was used for local anesthesia.  An ultrasound image was saved for documentation purposed. A 6 Fr Safe-T-Centesis catheter was introduced. The paracentesis was performed. The catheter was removed and a dressing was applied. The patient tolerated the procedure well without immediate post procedural complication.  FINDINGS: A total of approximately 3.8 liters of serous fluid was removed.  IMPRESSION: Successful ultrasound-guided paracentesis yielding 3.8 liters of peritoneal fluid.  Read By:  Tsosie Billing PA-C   Electronically Signed   By: Jacqulynn Cadet M.D.   On: 10/31/2014 11:18   US Paracentesis  10/15/2014    CLINICAL DATA:  ascites  EXAM: ULTRASOUND GUIDED  PARACENTESIS  COMPARISON:  None.  PROCEDURE: An ultrasound guided paracentesis was thoroughly discussed with the patient and questions answered. The benefits, risks, alternatives and complications were also discussed. The patient understands and wishes to proceed with the procedure. Written consent was obtained.  Ultrasound was performed to localize and mark an adequate pocket of fluid in the right quadrant of the abdomen. The area was then prepped and draped in the normal sterile fashion. 1% Lidocaine was used for local anesthesia. Under ultrasound guidance a 19 gauge Yueh catheter was introduced. Paracentesis was performed. The catheter was removed and a dressing applied.  COMPLICATIONS: None.  FINDINGS: A total of approximately 3.4 Liters of clear yellow fluid was removed. A fluid sample was not sent for laboratory analysis.  IMPRESSION: Successful ultrasound guided paracentesis yielding 3.4  liters of ascites.  Read by:  Gareth Eagle, PA-C   Electronically Signed   By: Aletta Edouard M.D.   On: 10/15/2014 15:29   Dg Chest Port 1 View  11/10/2014   CLINICAL DATA:  Status post nasogastric catheter placement  EXAM: PORTABLE CHEST - 1 VIEW  COMPARISON:  11/10/2014  FINDINGS: Cardiac shadow is stable. Mild right basilar atelectasis is again identified. The overall inspiratory effort is poor. A nasogastric catheter is noted extending into the stomach. No other focal abnormality is noted.  IMPRESSION: Nasogastric catheter within the stomach.   Electronically Signed   By: Inez Catalina M.D.   On: 11/10/2014 15:51   Dg Chest Port 1 View  11/10/2014   CLINICAL DATA:  Unresponsive, unable to follow commands, abdominal distension, history cirrhosis, diabetes mellitus, hypertension, COPD, hepatic encephalopathy  EXAM: PORTABLE CHEST - 1 VIEW  COMPARISON:  Portable exam 1236 hours compared to 09/18/2014  FINDINGS: Decreased lung volumes.  Upper normal heart size.  Normal  mediastinal contours and pulmonary vascularity.  Minimal RIGHT basilar atelectasis.  Lungs otherwise clear.  No pleural effusion or pneumothorax.  Bones unremarkable.  IMPRESSION: Minimal RIGHT basilar atelectasis.   Electronically Signed   By: Lavonia Dana M.D.   On: 11/10/2014 13:10    Microbiology: Recent Results (from the past 240 hour(s))  MRSA PCR Screening     Status: None   Collection Time: 11/10/14  6:53 PM  Result Value Ref Range Status   MRSA by PCR NEGATIVE NEGATIVE Final    Comment:        The GeneXpert MRSA Assay (FDA approved for NASAL specimens only), is one component of a comprehensive MRSA colonization surveillance program. It is not intended to diagnose MRSA infection nor to guide or monitor treatment for MRSA infections.   Culture, body fluid-bottle     Status: None (Preliminary result)   Collection Time: 11/11/14  2:25 PM  Result Value Ref Range Status   Specimen Description FLUID ASCITIC  Final   Special Requests NONE  Final   Culture NO GROWTH 1 DAY  Final   Report Status PENDING  Incomplete  Gram stain     Status: None   Collection Time: 11/11/14  2:25 PM  Result Value Ref Range Status   Specimen Description FLUID ASCITIC  Final   Special Requests NONE  Final   Gram Stain   Final    CYTOSPIN SMEAR WBC PRESENT,BOTH PMN AND MONONUCLEAR NO ORGANISMS SEEN    Report Status 11/11/2014 FINAL  Final     Labs: Basic Metabolic Panel:  Recent Labs Lab 11/10/14 1230 11/11/14 0322 11/12/14 0307 11/13/14 0600  NA 130* 131* 132* 133*  K 5.6* 4.5 4.0 4.3  CL 103 105 105 105  CO2 21* 21* 23 23  GLUCOSE 98 97 84 136*  BUN 22* 24* 22* 18  CREATININE 1.12 1.07 1.10 0.95  CALCIUM 8.1* 7.7* 7.9* 7.9*   Liver Function Tests:  Recent Labs Lab 11/10/14 1230 11/11/14 0322 11/12/14 0307 11/13/14 0600  AST 96* 72* 69* 73*  ALT 52 42 40 40  ALKPHOS 121 98 97 104  BILITOT 2.8* 3.1* 2.5* 1.9*  PROT 6.1* 5.1* 4.8* 4.9*  ALBUMIN 2.5* 2.1* 2.0* 2.0*   No  results for input(s): LIPASE, AMYLASE in the last 168 hours.  Recent Labs Lab 11/10/14 1230 11/11/14 0322 11/12/14 0307 11/13/14 0600  AMMONIA 228* 49* 39* 87*   CBC:  Recent Labs Lab 11/10/14 1230 11/11/14 0322 11/12/14 0307 11/13/14  0600  WBC 3.7* 4.8 4.4 4.0  HGB 9.5* 8.4* 8.2* 8.5*  HCT 27.2* 24.4* 24.1* 24.9*  MCV 92.8 95.3 94.1 94.7  PLT 64* 55* 61* 59*   Cardiac Enzymes: No results for input(s): CKTOTAL, CKMB, CKMBINDEX, TROPONINI in the last 168 hours. BNP: BNP (last 3 results)  Recent Labs  08/14/14 1910  BNP 29.3    ProBNP (last 3 results)  Recent Labs  02/08/14 1830 02/10/14 1547  PROBNP 966.3* 805.5*    CBG:  Recent Labs Lab 11/12/14 1202 11/12/14 1522 11/12/14 1744 11/12/14 2145 11/13/14 0802  GLUCAP 145* 122* 146* 140* 89       Signed:  Yovana Scogin  [facility] 11/13/2014, 10:58 AM

## 2014-11-13 NOTE — Progress Notes (Signed)
Nutrition Brief Note  Patient identified on the Malnutrition Screening Tool (MST) Report  Wt Readings from Last 15 Encounters:  11/10/14 225 lb 5 oz (102.2 kg)  10/06/14 219 lb (99.338 kg)  09/03/14 225 lb 12 oz (102.4 kg)  08/17/14 211 lb (95.709 kg)  07/31/14 220 lb 4.8 oz (99.927 kg)  05/26/14 211 lb (95.709 kg)  03/30/14 215 lb (97.523 kg)  02/14/14 230 lb (104.327 kg)  02/12/14 230 lb (104.327 kg)  02/08/14 230 lb (104.327 kg)  02/05/14 206 lb (93.441 kg)  02/02/14 219 lb (99.338 kg)  02/01/14 219 lb (99.338 kg)  01/29/14 234 lb 9.1 oz (106.4 kg)  01/14/14 245 lb 9.5 oz (111.4 kg)   [name] is a 59 y.o. male with past medical history of alcohol abuse, cirrhosis, ascites, recurrent admissions for hepatic encephalopathy, history of noncompliance, polysubstance abuse, chronic diastolic CHF, type 2 diabetes is here in the ER for AMS. Patient unable to provide any history due to mentation at this time, apparently EMS cart called home at 11:00 this morning for elevated blood sugar, he was found to be hyperglycemic and was given 8 units of insulin, he was initially talkative but lethargic when EMS arrived however subsequently became poorly responsive on route.  Pt sleeping soundly at time of visit. Reviewed wt hx, which revealed no weight loss. No signs of fat or muscle depletion noted. Pt consumed 100% of his breakfast this morning.   Body mass index is 34.27 kg/(m^2). Patient meets criteria for obesity, class I based on current BMI.   Current diet order is carb modified, patient is consuming approximately 100% of meals at this time. Labs and medications reviewed.   No nutrition interventions warranted at this time. If nutrition issues arise, please consult RD.   Jayziah Bankhead A. Jimmye Norman, RD, LDN, CDE Pager: [phone] After hours Pager: [phone]

## 2014-11-13 NOTE — Discharge Instructions (Signed)
Cirrhosis  Cirrhosis is a condition of scarring of the liver which is caused when the liver has tried repairing itself following damage. This damage may come from a previous infection such as one of the forms of hepatitis (usually hepatitis C), or the damage may come from being injured by toxins. The main toxin that causes this damage is alcohol. The scarring of the liver from use of alcohol is irreversible. That means the liver cannot return to normal even though alcohol is not used any more. The main danger of hepatitis C infection is that it may cause long-lasting (chronic) liver disease, and this also may lead to cirrhosis. This complication is progressive and irreversible.  CAUSES   Prior to available blood tests, hepatitis C could be contracted by blood transfusions. Since testing of blood has improved, this is now unlikely. This infection can also be contracted through intravenous drug use and the sharing of needles. It can also be contracted through sexual relationships. The injury caused by alcohol comes from too much use. It is not a few drinks that poison the liver, but years of misuse. Usually there will be some signs and symptoms early with scarring of the liver that suggest the development of better habits. Alcohol should never be used while using acetaminophen. A small dose of both taken together may cause irreversible damage to the liver.  HOME CARE INSTRUCTIONS   There is no specific treatment for cirrhosis. However, there are things you can do to avoid making the condition worse.  · Rest as needed.  · Eat a well-balanced diet. Your caregiver can help you with suggestions.  · Vitamin supplements including vitamins A, K, D, and thiamine can help.  · A low-salt diet, water restriction, or diuretic medicine may be needed to reduce fluid retention.  · Avoid alcohol. This can be extremely toxic if combined with acetaminophen.  · Avoid drugs which are toxic to the liver. Some of these include isoniazid,  methyldopa, acetaminophen, anabolic steroids (muscle-building drugs), erythromycin, and oral contraceptives (birth control pills). Check with your caregiver to make sure medicines you are presently taking will not be harmful.  · Periodic blood tests may be required. Follow your caregiver's advice regarding the timing of these.  · Milk thistle is an herbal remedy which does protect the liver against toxins. However, it will not help once the liver has been scarred.  SEEK MEDICAL CARE IF:  · You have increasing fatigue or weakness.  · You develop swelling of the hands, feet, legs, or face.  · You vomit bright red blood, or a coffee ground appearing material.  · You have blood in your stools, or the stools turn black and tarry.  · You have a fever.  · You develop loss of appetite, or have nausea and vomiting.  · You develop jaundice.  · You develop easy bruising or bleeding.  · You have worsening of any of the problems you are concerned about.  Document Released: 05/09/2005 Document Revised: 08/01/2011 Document Reviewed: 12/26/2007  [employer]® Patient Information ©2015 [employer]. This information is not intended to replace advice given to you by your health care provider. Make sure you discuss any questions you have with your health care provider.

## 2014-11-13 NOTE — Progress Notes (Signed)
Occupational Therapy Evaluation Patient Details Name: [name] MRN: [id number] DOB: 1956-02-07 Today's Date: 11/13/2014    History of Present Illness [name] is a 59 y.o. male with past medical history of alcohol abuse, cirrhosis, ascites, recurrent admissions for hepatic encephalopathy, history of noncompliance, polysubstance abuse, chronic diastolic CHF, type 2 diabetes is here in the ER for AMS.   Clinical Impression   Patient presenting with deconditioning. Patient independent -> mod I PTA. Patient currently functioning at an overall supervision level. Patient will benefit from acute OT to increase overall independence in the areas of ADLs, functional mobility, and overall safety in order to safely discharge home with intermittent assistance from family (finace and brother).     Follow Up Recommendations  No OT follow up;Supervision - Intermittent    Equipment Recommendations  None recommended by OT    Recommendations for Other Services  None at this time  Precautions / Restrictions Precautions Precautions: None Restrictions Weight Bearing Restrictions: No      Mobility Bed Mobility Overal bed mobility: Modified Independent General bed mobility comments: extra time and using bed rails  Transfers Overall transfer level: Needs assistance Equipment used: None Transfers: Sit to/from Stand Sit to Stand: Supervision General transfer comment: Pt unsafe with use of RW, can benefit from RW safety education.     Balance Overall balance assessment: Needs assistance Sitting-balance support: No upper extremity supported;Feet supported Sitting balance-Leahy Scale: Good     Standing balance support: No upper extremity supported;During functional activity Standing balance-Leahy Scale: Fair     ADL Overall ADL's : Needs assistance/impaired General ADL Comments: Pt overall supervision. Pt will benefit from acute OT to increased independence to an independent -> mod I  level and increase overall safety at home. No follow-up recommendations at this time.      Vision Additional Comments: no change from baseline          Pertinent Vitals/Pain Pain Assessment: No/denies pain     Hand Dominance Right   Extremity/Trunk Assessment Upper Extremity Assessment Upper Extremity Assessment: Overall WFL for tasks assessed   Lower Extremity Assessment Lower Extremity Assessment: Defer to PT evaluation   Cervical / Trunk Assessment Cervical / Trunk Assessment: Normal   Communication Communication Communication: No difficulties   Cognition Arousal/Alertness: Awake/alert Behavior During Therapy: WFL for tasks assessed/performed Overall Cognitive Status: Within Functional Limits for tasks assessed              Home Living Family/patient expects to be discharged to:: Private residence Living Arrangements: Alone (Pt reports brother plans to move in with him) Available Help at Discharge: Family;Personal care attendant (Finance and brother, per pt report) Type of Home: Apartment Home Access: Level entry     Home Layout: One level     Bathroom Shower/Tub: Tub/shower unit;Curtain   Biochemist, clinical: Standard     Home Equipment: Environmental consultant - 2 wheels;Bedside commode;Tub bench;Hand held shower head   Additional Comments: patient states that he lives by himself and that he has no steps to get in, he can just walk through the laundry room      Prior Functioning/Environment Level of Independence: Independent with assistive device(s);Needs assistance  Gait / Transfers Assistance Needed: Uses RW vs cane (cane for house hold mobility and RW for community mobility) ADL's / Homemaking Assistance Needed: mod I, uses bench and BSC beside bed)    OT Diagnosis: Generalized weakness   OT Problem List: Decreased strength;Decreased activity tolerance;Impaired balance (sitting and/or standing);Decreased safety awareness  OT Treatment/Interventions: Self-care/ADL  training;Therapeutic exercise;DME and/or AE instruction;Therapeutic activities;Balance training;Patient/family education    OT Goals(Current goals can be found in the care plan section) Acute Rehab OT Goals Patient Stated Goal: go home today! OT Goal Formulation: With patient Time For Goal Achievement: 11/20/14 Potential to Achieve Goals: Good ADL Goals Pt Will Perform Grooming: Independently;standing Pt Will Perform Upper Body Bathing: Independently;sitting Pt Will Perform Lower Body Bathing: Independently;sit to/from stand Pt Will Perform Upper Body Dressing: Independently;sitting Pt Will Perform Lower Body Dressing: Independently;sit to/from stand Pt Will Transfer to Toilet: with modified independence;ambulating;bedside commode Pt Will Perform Toileting - Clothing Manipulation and hygiene: Independently;sit to/from stand Pt Will Perform Tub/Shower Transfer: Tub transfer;tub bench;ambulating;with modified independence  OT Frequency: Min 2X/week   Barriers to D/C: None known at this time   End of Session Equipment Utilized During Treatment: Rolling walker  Activity Tolerance: Patient tolerated treatment well Patient left: in chair;with call bell/phone within reach   Time: 1165-7903 OT Time Calculation (min): 25 min Charges:  OT General Charges $OT Visit: 1 Procedure OT Evaluation $Initial OT Evaluation Tier I: 1 Procedure OT Treatments $Self Care/Home Management : 8-22 mins  Chelsa Stout , MS, OTR/L, CLT Pager: [phone]  11/13/2014, 10:22 AM

## 2014-11-16 LAB — CULTURE, BODY FLUID-BOTTLE

## 2014-11-16 LAB — CULTURE, BODY FLUID W GRAM STAIN -BOTTLE: Culture: NO GROWTH

## 2014-11-29 ENCOUNTER — Emergency Department (HOSPITAL_COMMUNITY): Payer: Medicare PPO

## 2014-11-29 ENCOUNTER — Inpatient Hospital Stay (HOSPITAL_COMMUNITY)
Admission: EM | Admit: 2014-11-29 | Discharge: 2014-12-04 | DRG: 378 | Disposition: A | Discharge status: Home / Self Care | Payer: Medicare PPO | Attending: Internal Medicine | Admitting: Internal Medicine

## 2014-11-29 ENCOUNTER — Emergency Department (HOSPITAL_BASED_OUTPATIENT_CLINIC_OR_DEPARTMENT_OTHER)
Admit: 2014-11-29 | Discharge: 2014-11-29 | Disposition: A | Discharge status: Home / Self Care | Payer: Medicare PPO | Attending: Emergency Medicine | Admitting: Emergency Medicine

## 2014-11-29 ENCOUNTER — Encounter (HOSPITAL_COMMUNITY): Payer: Self-pay | Admitting: Emergency Medicine

## 2014-11-29 DIAGNOSIS — K219 Gastro-esophageal reflux disease without esophagitis: Secondary | ICD-10-CM | POA: Diagnosis present

## 2014-11-29 DIAGNOSIS — R609 Edema, unspecified: Secondary | ICD-10-CM | POA: Diagnosis not present

## 2014-11-29 DIAGNOSIS — N179 Acute kidney failure, unspecified: Secondary | ICD-10-CM | POA: Diagnosis present

## 2014-11-29 DIAGNOSIS — K2971 Gastritis, unspecified, with bleeding: Secondary | ICD-10-CM

## 2014-11-29 DIAGNOSIS — D6959 Other secondary thrombocytopenia: Secondary | ICD-10-CM | POA: Diagnosis present

## 2014-11-29 DIAGNOSIS — K7290 Hepatic failure, unspecified without coma: Secondary | ICD-10-CM | POA: Diagnosis present

## 2014-11-29 DIAGNOSIS — G629 Polyneuropathy, unspecified: Secondary | ICD-10-CM | POA: Diagnosis present

## 2014-11-29 DIAGNOSIS — M1990 Unspecified osteoarthritis, unspecified site: Secondary | ICD-10-CM | POA: Diagnosis present

## 2014-11-29 DIAGNOSIS — K922 Gastrointestinal hemorrhage, unspecified: Principal | ICD-10-CM | POA: Diagnosis present

## 2014-11-29 DIAGNOSIS — Z8673 Personal history of transient ischemic attack (TIA), and cerebral infarction without residual deficits: Secondary | ICD-10-CM | POA: Diagnosis not present

## 2014-11-29 DIAGNOSIS — I8500 Esophageal varices without bleeding: Secondary | ICD-10-CM | POA: Diagnosis present

## 2014-11-29 DIAGNOSIS — F1010 Alcohol abuse, uncomplicated: Secondary | ICD-10-CM | POA: Diagnosis present

## 2014-11-29 DIAGNOSIS — E1122 Type 2 diabetes mellitus with diabetic chronic kidney disease: Secondary | ICD-10-CM | POA: Diagnosis present

## 2014-11-29 DIAGNOSIS — D62 Acute posthemorrhagic anemia: Secondary | ICD-10-CM | POA: Diagnosis present

## 2014-11-29 DIAGNOSIS — G9340 Encephalopathy, unspecified: Secondary | ICD-10-CM | POA: Diagnosis present

## 2014-11-29 DIAGNOSIS — K766 Portal hypertension: Secondary | ICD-10-CM | POA: Diagnosis present

## 2014-11-29 DIAGNOSIS — I5032 Chronic diastolic (congestive) heart failure: Secondary | ICD-10-CM | POA: Diagnosis present

## 2014-11-29 DIAGNOSIS — I129 Hypertensive chronic kidney disease with stage 1 through stage 4 chronic kidney disease, or unspecified chronic kidney disease: Secondary | ICD-10-CM | POA: Diagnosis present

## 2014-11-29 DIAGNOSIS — D696 Thrombocytopenia, unspecified: Secondary | ICD-10-CM | POA: Diagnosis present

## 2014-11-29 DIAGNOSIS — R188 Other ascites: Secondary | ICD-10-CM | POA: Diagnosis not present

## 2014-11-29 DIAGNOSIS — F17210 Nicotine dependence, cigarettes, uncomplicated: Secondary | ICD-10-CM | POA: Diagnosis present

## 2014-11-29 DIAGNOSIS — K3189 Other diseases of stomach and duodenum: Secondary | ICD-10-CM | POA: Diagnosis present

## 2014-11-29 DIAGNOSIS — K7031 Alcoholic cirrhosis of liver with ascites: Secondary | ICD-10-CM | POA: Diagnosis present

## 2014-11-29 DIAGNOSIS — R140 Abdominal distension (gaseous): Secondary | ICD-10-CM | POA: Diagnosis present

## 2014-11-29 DIAGNOSIS — E871 Hypo-osmolality and hyponatremia: Secondary | ICD-10-CM | POA: Diagnosis present

## 2014-11-29 DIAGNOSIS — F1410 Cocaine abuse, uncomplicated: Secondary | ICD-10-CM | POA: Diagnosis present

## 2014-11-29 DIAGNOSIS — J449 Chronic obstructive pulmonary disease, unspecified: Secondary | ICD-10-CM | POA: Diagnosis present

## 2014-11-29 DIAGNOSIS — N183 Chronic kidney disease, stage 3 (moderate): Secondary | ICD-10-CM | POA: Diagnosis present

## 2014-11-29 DIAGNOSIS — Z720 Tobacco use: Secondary | ICD-10-CM | POA: Diagnosis present

## 2014-11-29 LAB — CBC WITH DIFFERENTIAL/PLATELET
Basophils Absolute: 0 K/uL (ref 0.0–0.1)
Basophils Relative: 0 % (ref 0–1)
Eosinophils Absolute: 0.1 K/uL (ref 0.0–0.7)
Eosinophils Relative: 2 % (ref 0–5)
HCT: 20.8 % — ABNORMAL LOW (ref 39.0–52.0)
Hemoglobin: 7.2 g/dL — ABNORMAL LOW (ref 13.0–17.0)
Lymphocytes Relative: 12 % (ref 12–46)
Lymphs Abs: 0.7 K/uL (ref 0.7–4.0)
MCH: 32.9 pg (ref 26.0–34.0)
MCHC: 34.6 g/dL (ref 30.0–36.0)
MCV: 95 fL (ref 78.0–100.0)
Monocytes Absolute: 0.6 K/uL (ref 0.1–1.0)
Monocytes Relative: 11 % (ref 3–12)
Neutro Abs: 4.1 K/uL (ref 1.7–7.7)
Neutrophils Relative %: 74 % (ref 43–77)
Platelets: 98 K/uL — ABNORMAL LOW (ref 150–400)
RBC: 2.19 MIL/uL — ABNORMAL LOW (ref 4.22–5.81)
RDW: 17.1 % — ABNORMAL HIGH (ref 11.5–15.5)
WBC: 5.5 K/uL (ref 4.0–10.5)

## 2014-11-29 LAB — COMPREHENSIVE METABOLIC PANEL WITH GFR
ALT: 74 U/L — ABNORMAL HIGH (ref 17–63)
AST: 114 U/L — ABNORMAL HIGH (ref 15–41)
Albumin: 2.3 g/dL — ABNORMAL LOW (ref 3.5–5.0)
Alkaline Phosphatase: 105 U/L (ref 38–126)
Anion gap: 7 (ref 5–15)
BUN: 35 mg/dL — ABNORMAL HIGH (ref 6–20)
CO2: 20 mmol/L — ABNORMAL LOW (ref 22–32)
Calcium: 7.8 mg/dL — ABNORMAL LOW (ref 8.9–10.3)
Chloride: 100 mmol/L — ABNORMAL LOW (ref 101–111)
Creatinine, Ser: 1.4 mg/dL — ABNORMAL HIGH (ref 0.61–1.24)
GFR calc Af Amer: >60 mL/min
GFR calc non Af Amer: 54 mL/min — ABNORMAL LOW
Glucose, Bld: 94 mg/dL (ref 65–99)
Potassium: 4.9 mmol/L (ref 3.5–5.1)
Sodium: 127 mmol/L — ABNORMAL LOW (ref 135–145)
Total Bilirubin: 3.3 mg/dL — ABNORMAL HIGH (ref 0.3–1.2)
Total Protein: 5.5 g/dL — ABNORMAL LOW (ref 6.5–8.1)

## 2014-11-29 LAB — PROTIME-INR
INR: 1.58 — ABNORMAL HIGH (ref 0.00–1.49)
Prothrombin Time: 18.9 s — ABNORMAL HIGH (ref 11.6–15.2)

## 2014-11-29 LAB — BODY FLUID CELL COUNT WITH DIFFERENTIAL
Eos, Fluid: 0 %
Lymphs, Fluid: 6 %
Monocyte-Macrophage-Serous Fluid: 82 % (ref 50–90)
Neutrophil Count, Fluid: 0 % (ref 0–25)
Other Cells, Fluid: 12 %
WBC FLUID: 90 uL (ref 0–1000)

## 2014-11-29 LAB — URINALYSIS, ROUTINE W REFLEX MICROSCOPIC
Bilirubin Urine: NEGATIVE
Glucose, UA: NEGATIVE mg/dL
Hgb urine dipstick: NEGATIVE
Ketones, ur: NEGATIVE mg/dL
Leukocytes, UA: NEGATIVE
NITRITE: NEGATIVE
PROTEIN: NEGATIVE mg/dL
Specific Gravity, Urine: 1.014 (ref 1.005–1.030)
Urobilinogen, UA: 0.2 mg/dL (ref 0.0–1.0)
pH: 5 (ref 5.0–8.0)

## 2014-11-29 LAB — AMMONIA: Ammonia: 47 umol/L — ABNORMAL HIGH (ref 9–35)

## 2014-11-29 LAB — PREPARE RBC (CROSSMATCH)

## 2014-11-29 LAB — CBG MONITORING, ED: Glucose-Capillary: 100 mg/dL — ABNORMAL HIGH (ref 65–99)

## 2014-11-29 LAB — BRAIN NATRIURETIC PEPTIDE: B Natriuretic Peptide: 29.4 pg/mL (ref 0.0–100.0)

## 2014-11-29 MED ORDER — AMITRIPTYLINE HCL 25 MG PO TABS
25.0000 mg | ORAL_TABLET | Freq: Every day | ORAL | Status: DC
Start: 2014-11-29 — End: 2014-12-04
  Administered 2014-11-29 – 2014-12-03 (×5): 25 mg via ORAL
  Filled 2014-11-29 (×6): qty 1

## 2014-11-29 MED ORDER — SODIUM CHLORIDE 0.9 % IJ SOLN: 3.0000 mL | INTRAMUSCULAR | Status: DC | PRN

## 2014-11-29 MED ORDER — ALUM & MAG HYDROXIDE-SIMETH 200-200-20 MG/5ML PO SUSP
30.0000 mL | Freq: Four times a day (QID) | ORAL | Status: DC | PRN
Start: 2014-11-29 — End: 2014-12-04

## 2014-11-29 MED ORDER — ONDANSETRON HCL 4 MG PO TABS
4.0000 mg | ORAL_TABLET | Freq: Four times a day (QID) | ORAL | Status: DC | PRN
Start: 2014-11-29 — End: 2014-12-04

## 2014-11-29 MED ORDER — FUROSEMIDE 40 MG PO TABS
40.0000 mg | ORAL_TABLET | Freq: Every day | ORAL | Status: DC
  Administered 2014-12-01: 40 mg via ORAL
  Filled 2014-11-29 (×4): qty 1

## 2014-11-29 MED ORDER — ONDANSETRON HCL 4 MG/2ML IJ SOLN: 4.0000 mg | Freq: Four times a day (QID) | INTRAMUSCULAR | Status: DC | PRN

## 2014-11-29 MED ORDER — SPIRONOLACTONE 25 MG PO TABS
25.0000 mg | ORAL_TABLET | Freq: Three times a day (TID) | ORAL | Status: DC
  Filled 2014-11-29 (×3): qty 1

## 2014-11-29 MED ORDER — RIFAXIMIN 550 MG PO TABS
550.0000 mg | ORAL_TABLET | Freq: Two times a day (BID) | ORAL | Status: DC
  Administered 2014-11-29 – 2014-12-04 (×9): 550 mg via ORAL
  Filled 2014-11-29 (×12): qty 1

## 2014-11-29 MED ORDER — PROPRANOLOL HCL 20 MG PO TABS
20.0000 mg | ORAL_TABLET | Freq: Two times a day (BID) | ORAL | Status: DC
  Administered 2014-11-29 – 2014-12-03 (×8): 20 mg via ORAL
  Filled 2014-11-29 (×11): qty 1

## 2014-11-29 MED ORDER — SODIUM CHLORIDE 0.9 % IV SOLN
250.0000 mL | INTRAVENOUS | Status: DC | PRN
Start: 2014-11-29 — End: 2014-12-04

## 2014-11-29 MED ORDER — PANTOPRAZOLE SODIUM 40 MG IV SOLR
40.0000 mg | Freq: Two times a day (BID) | INTRAVENOUS | Status: DC
  Administered 2014-11-29 – 2014-12-04 (×10): 40 mg via INTRAVENOUS
  Filled 2014-11-29 (×11): qty 40

## 2014-11-29 MED ORDER — FUROSEMIDE 10 MG/ML IJ SOLN
20.0000 mg | Freq: Once | INTRAMUSCULAR | Status: AC
  Administered 2014-11-29: 20 mg via INTRAVENOUS
  Filled 2014-11-29: qty 2

## 2014-11-29 MED ORDER — CLONAZEPAM 0.5 MG PO TABS
0.2500 mg | ORAL_TABLET | Freq: Two times a day (BID) | ORAL | Status: DC
  Administered 2014-11-29: 0.25 mg via ORAL
  Filled 2014-11-29: qty 1

## 2014-11-29 MED ORDER — SODIUM CHLORIDE 0.9 % IV SOLN
Freq: Once | INTRAVENOUS | Status: AC
  Administered 2014-11-29: 17:00:00 via INTRAVENOUS

## 2014-11-29 MED ORDER — PANTOPRAZOLE SODIUM 40 MG IV SOLR
40.0000 mg | Freq: Once | INTRAVENOUS | Status: AC
  Administered 2014-11-29: 40 mg via INTRAVENOUS
  Filled 2014-11-29: qty 40

## 2014-11-29 MED ORDER — SODIUM CHLORIDE 0.9 % IJ SOLN
3.0000 mL | Freq: Two times a day (BID) | INTRAMUSCULAR | Status: DC
  Administered 2014-11-29 – 2014-12-04 (×6): 3 mL via INTRAVENOUS

## 2014-11-29 MED ORDER — LACTULOSE 10 GM/15ML PO SOLN
30.0000 g | Freq: Three times a day (TID) | ORAL | Status: DC
  Administered 2014-11-30 – 2014-12-04 (×12): 30 g via ORAL
  Filled 2014-11-29 (×16): qty 45

## 2014-11-29 MED ORDER — LIDOCAINE HCL (PF) 1 % IJ SOLN
INTRAMUSCULAR | Status: AC
  Filled 2014-11-29: qty 10

## 2014-11-29 MED ORDER — OXYCODONE HCL 5 MG PO TABS
5.0000 mg | ORAL_TABLET | Freq: Three times a day (TID) | ORAL | Status: DC
  Administered 2014-11-29 – 2014-12-01 (×5): 5 mg via ORAL
  Filled 2014-11-29 (×5): qty 1

## 2014-11-29 MED ORDER — MAGNESIUM OXIDE 400 (241.3 MG) MG PO TABS
400.0000 mg | ORAL_TABLET | Freq: Two times a day (BID) | ORAL | Status: DC
  Administered 2014-11-29 – 2014-12-04 (×9): 400 mg via ORAL
  Filled 2014-11-29 (×12): qty 1

## 2014-11-29 MED ORDER — ADULT MULTIVITAMIN W/MINERALS CH
1.0000 | ORAL_TABLET | Freq: Every day | ORAL | Status: DC
  Administered 2014-12-01 – 2014-12-04 (×4): 1 via ORAL
  Filled 2014-11-29 (×6): qty 1

## 2014-11-29 MED ORDER — SODIUM CHLORIDE 0.9 % IJ SOLN
3.0000 mL | Freq: Two times a day (BID) | INTRAMUSCULAR | Status: DC
  Administered 2014-12-01 – 2014-12-02 (×2): 3 mL via INTRAVENOUS

## 2014-11-29 MED ORDER — FOLIC ACID 1 MG PO TABS
1.0000 mg | ORAL_TABLET | Freq: Every day | ORAL | Status: DC
  Administered 2014-12-01 – 2014-12-04 (×4): 1 mg via ORAL
  Filled 2014-11-29 (×6): qty 1

## 2014-11-29 MED ORDER — INSULIN GLARGINE 100 UNIT/ML ~~LOC~~ SOLN
45.0000 [IU] | Freq: Every day | SUBCUTANEOUS | Status: DC
  Administered 2014-11-29 – 2014-12-03 (×5): 45 [IU] via SUBCUTANEOUS
  Filled 2014-11-29 (×7): qty 0.45

## 2014-11-29 NOTE — ED Provider Notes (Signed)
CSN: [id number]     Arrival date & time 11/29/14  1105 History   First MD Initiated Contact with Patient 11/29/14 1119     Chief Complaint  Patient presents with  . Shortness of Breath  . Leg Swelling  . Abdominal Pain     (Consider location/radiation/quality/duration/timing/severity/associated sxs/prior Treatment) HPI Comments: Patient reports 1 month of progressively worsening leg swelling and abdominal swelling. Feels like he is short of breath because of his swelling. States compliance with his medications. He denies any chest pain, cough or fever. He has a history of cirrhosis and recurrent ascites. His last paracentesis was June 21. He denies any thing that improves his symptoms. He denies any pain anywhere. He denies any dizziness or lightheadedness. States compliance with his medications. Recent admission to the hospital for hepatic encephalopathy.  Patient is a 59 y.o. male presenting with shortness of breath and abdominal pain. The history is provided by the patient.  Shortness of Breath Associated symptoms: abdominal pain   Associated symptoms: no cough, no fever, no headaches, no rash and no vomiting   Abdominal Pain Associated symptoms: fatigue and shortness of breath   Associated symptoms: no cough, no dysuria, no fever, no nausea and no vomiting     Past Medical History  Diagnosis Date  . Neuropathy   . Diabetes mellitus   . Bipolar affect, depressed   . Hypertension   . Arthritis   . Stroke     Mini stroke about 22yrs ago  . Cirrhosis   . Alcohol abuse   . Chronic pain   . Cocaine abuse   . Muscle spasm     both legs  . Encephalopathy, hepatic   . Detached retina   . COPD (chronic obstructive pulmonary disease)     emphysema  . Bronchitis   . Barrett's esophagus   . GERD (gastroesophageal reflux disease)     has ulcer  . Anemia    Past Surgical History  Procedure Laterality Date  . Fracture surgery      Leg and arm 97yrs ago  .  Esophagogastroduodenoscopy  04/04/2012    Procedure: ESOPHAGOGASTRODUODENOSCOPY (EGD);  Surgeon: Irene Shipper, MD;  Location: [facility] ENDOSCOPY;  Service: Endoscopy;  Laterality: N/A;  . Esophagogastroduodenoscopy Left 03/13/2013    Procedure: ESOPHAGOGASTRODUODENOSCOPY (EGD);  Surgeon: Arta Silence, MD;  Location[facility] END[facility]COPY;  Service: Endoscopy;  Laterality: Left;  Marland Kitchen Eye surgery  8 months ago both eyes    cataracts both eyes, detached eye, gas pocket  . Vasectomy    . Pars plana vitrectomy Left 07/08/2013    Procedure: PARS PLANA VITRECTOMY WITH 25 GAUGE;  Surgeon: Hurman Horn, MD;  Location: [place];  Service: Ophthalmology;  Laterality: Left;  . Intraocular lens removal Left 07/08/2013    Procedure: REMOVAL OF INTRAOCULAR LENS;  Surgeon: Hurman Horn, MD;  Location: [place];  Service: Ophthalmology;  Laterality: Left;  . Placement and suture of secondary intraocular lens Left 07/08/2013    Procedure: PLACEMENT AND SUTURE OF SECONDARY INTRAOCULAR LENS;  Surgeon: Hurman Horn, MD;  Location: [place];  Service: Ophthalmology;  Laterality: Left;  Insertion of Anterior Capsule Intraocular Lens   . Esophagogastroduodenoscopy N/A 01/16/2014    Procedure: ESOPHAGOGASTRODUODENOSCOPY (EGD);  Surgeon: Winfield Cunas., MD;  Location: [facility] ENDOSCOPY;  Service: Endoscopy;  Laterality: N/A;  . Colonoscopy N/A 01/17/2014    Procedure: COLONOSCOPY;  Surgeon: Winfield Cunas., MD;  Location: [facility] ENDOSCOPY;  Service: Endoscopy;  Laterality: N/A;  possible banding  . Esophagogastroduodenoscopy N/A 08/30/2014    Procedure: ESOPHAGOGASTRODUODENOSCOPY (EGD);  Surgeon: Wilford Corner, MD;  Location[facility] ENDOSCOPY;  Service: Endoscopy;  Laterality: N/A;  bedside   Family History  Problem Relation Age of Onset  . Hypotension Mother    History  Substance Use Topics  . Smoking status: Current Every Day Smoker -- 1.00 packs/day for 30 years    Types: Cigarettes  . Smokeless tobacco: Never Used  . Alcohol Use: 0.0  oz/week     Comment: 12 pk beer daily  06/2013 - no alcohol since 11/2012    Review of Systems  Constitutional: Positive for activity change, appetite change and fatigue. Negative for fever.  HENT: Negative for congestion and rhinorrhea.   Respiratory: Positive for shortness of breath. Negative for cough and chest tightness.   Cardiovascular: Positive for leg swelling.  Gastrointestinal: Positive for abdominal pain and abdominal distention. Negative for nausea and vomiting.  Genitourinary: Negative for dysuria and testicular pain.  Musculoskeletal: Negative for myalgias and arthralgias.  Skin: Negative for rash.  Neurological: Positive for weakness. Negative for dizziness and headaches.  A complete 10 system review of systems was obtained and all systems are negative except as noted in the HPI and PMH.      Allergies  Review of patient's allergies indicates no known allergies.  Home Medications   Prior to Admission medications   Medication Sig Start Date End Date Taking? Authorizing Provider  clonazePAM (KLONOPIN) 0.25 MG disintegrating tablet Take 0.25 mg by mouth 2 (two) times daily.  10/17/14  Yes Historical Provider, MD  diazepam (VALIUM) 5 MG tablet TAKE 1 TABLET TWICE A DAY 10/17/14  Yes Historical Provider, MD  LANTUS SOLOSTAR 100 UNIT/ML Solostar Pen Inject 45 Units into the skin daily at 10 pm.  11/07/14  Yes Historical Provider, MD  Oxycodone HCl 10 MG TABS Take 10 mg by mouth 3 (three) times daily. 11/14/14  Yes Historical Provider, MD  amitriptyline (ELAVIL) 25 MG tablet Take 1 tablet (25 mg total) by mouth at bedtime. 10/05/14   Nita Sells, MD  CALCIUM-VITAMIN D PO Take 100 Units by mouth daily.    Historical Provider, MD  diclofenac sodium (VOLTAREN) 1 % GEL Apply 4 g topically as needed (for back of leg pain).    Historical Provider, MD  feeding supplement, ENSURE ENLIVE, (ENSURE ENLIVE) LIQD Take 237 mLs by mouth 2 (two) times daily between meals. 11/13/14   Maryann  Mikhail, DO  folic acid (FOLVITE) 1 MG tablet Take 1 tablet (1 mg total) by mouth daily. 10/05/14   Nita Sells, MD  furosemide (LASIX) 40 MG tablet Take 40 mg by mouth daily.  10/25/14   Historical Provider, MD  insulin aspart (NOVOLOG FLEXPEN) 100 UNIT/ML FlexPen Inject 6 Units into the skin 3 (three) times daily with meals. For diabetes Patient taking differently: Inject 6 Units into the skin 3 (three) times daily with meals. Not needed if Lantus is used. 02/19/14   Encarnacion Slates, NP  lactulose (CHRONULAC) 10 GM/15ML solution Take 45 mLs (30 g total) by mouth 3 (three) times daily. Patient taking differently: Take 20 g by mouth 3 (three) times daily.  09/03/14   Reyne Dumas, MD  lisinopril (PRINIVIL,ZESTRIL) 20 MG tablet Take 20 mg by mouth daily.  08/21/14   Historical Provider, MD  lisinopril (PRINIVIL,ZESTRIL) 20 MG tablet Take 10 mg by mouth daily.    Historical Provider, MD  magnesium oxide (MAG-OX) 400 (241.3 MG) MG tablet Take 1 tablet (  400 mg total) by mouth 2 (two) times daily. 09/03/14   Reyne Dumas, MD  Multiple Vitamin (MULTIVITAMIN WITH MINERALS) TABS tablet Take 1 tablet by mouth daily. 07/31/14   Geradine Girt, DO  oxyCODONE (OXY IR/ROXICODONE) 5 MG immediate release tablet Take 5 mg by mouth every 8 (eight) hours. 09/24/14   Historical Provider, MD  pantoprazole (PROTONIX) 40 MG tablet Take 40 mg by mouth daily.  07/09/14   Historical Provider, MD  propranolol (INDERAL) 20 MG tablet Take 20 mg by mouth 2 (two) times daily. 09/24/14   Historical Provider, MD  spironolactone (ALDACTONE) 100 MG tablet Take 100 mg by mouth daily. 10/17/14   Historical Provider, MD  spironolactone (ALDACTONE) 25 MG tablet Take 25 mg by mouth 3 (three) times daily.  10/27/14   Historical Provider, MD  spironolactone (ALDACTONE) 50 MG tablet Take 100 mg by mouth daily.    Historical Provider, MD  thiamine (VITAMIN B-1) 100 MG tablet Take 100 mg by mouth daily.    Historical Provider, MD  XIFAXAN 550 MG TABS  tablet Take 550 mg by mouth 2 (two) times daily.  10/29/14   Historical Provider, MD   BP 121/51 mmHg  Pulse 81  Temp(Src) 98.7 F (37.1 C) (Oral)  Resp 16  Ht 5\' 11"  (1.803 m)  Wt 232 lb 14.4 oz (105.643 kg)  BMI 32.50 kg/m2  SpO2 100% Physical Exam  Constitutional: He is oriented to person, place, and time. He appears well-developed and well-nourished. No distress.  HENT:  Head: Normocephalic and atraumatic.  Mouth/Throat: Oropharynx is clear and moist. No oropharyngeal exudate.  Eyes: Conjunctivae and EOM are normal. Pupils are equal, round, and reactive to light.  Neck: Normal range of motion. Neck supple.  Cardiovascular: Normal rate, regular rhythm and normal heart sounds.   Pulmonary/Chest: Breath sounds normal.  Abdominal: He exhibits distension. There is tenderness.  Distended abdomen, positive fluid wave, nontender  Musculoskeletal: He exhibits edema and tenderness.  +3 pitting edema to mid thigh bilaterally, left greater than right.  Neurological: He is alert and oriented to person, place, and time. No cranial nerve deficit. He exhibits normal muscle tone. Coordination normal.  No asterixis. CN 2-12 intact, 5/5 strength throughout. No ataxia on finger to nose.  Skin: Skin is warm.    ED Course  Procedures (including critical care time) Labs Review Labs Reviewed  CBC WITH DIFFERENTIAL/PLATELET - Abnormal; Notable for the following:    RBC 2.19 (*)    Hemoglobin 7.2 (*)    HCT 20.8 (*)    RDW 17.1 (*)    Platelets 98 (*)    All other components within normal limits  PROTIME-INR - Abnormal; Notable for the following:    Prothrombin Time 18.9 (*)    INR 1.58 (*)    All other components within normal limits  COMPREHENSIVE METABOLIC PANEL - Abnormal; Notable for the following:    Sodium 127 (*)    Chloride 100 (*)    CO2 20 (*)    BUN 35 (*)    Creatinine, Ser 1.40 (*)    Calcium 7.8 (*)    Total Protein 5.5 (*)    Albumin 2.3 (*)    AST 114 (*)    ALT 74 (*)     Total Bilirubin 3.3 (*)    GFR calc non Af Amer 54 (*)    All other components within normal limits  AMMONIA - Abnormal; Notable for the following:    Ammonia 47 (*)  All other components within normal limits  URINALYSIS, ROUTINE W REFLEX MICROSCOPIC (NOT AT [facility]) - Abnormal; Notable for the following:    Color, Urine AMBER (*)    All other components within normal limits  CBG MONITORING, ED - Abnormal; Notable for the following:    Glucose-Capillary 100 (*)    All other components within normal limits  BODY FLUID CULTURE  GRAM STAIN  GRAM STAIN  BODY FLUID CULTURE  BRAIN NATRIURETIC PEPTIDE  SYNOVIAL CELL COUNT + DIFF, W/ CRYSTALS  GLUCOSE, SYNOVIAL FLUID  BODY FLUID CELL COUNT WITH DIFFERENTIAL  BASIC METABOLIC PANEL  CBC  POC OCCULT BLOOD, ED  TYPE AND SCREEN  PREPARE RBC (CROSSMATCH)    Imaging Review Dg Chest 2 View  11/29/2014   CLINICAL DATA:  Lower extremity edema, abdominal distention x1 week  EXAM: CHEST - 2 VIEW  COMPARISON:  11/10/2014  FINDINGS: Improved aeration. Lungs are clear. Heart size normal. No pneumothorax. No effusion. Visualized skeletal structures are unremarkable.  IMPRESSION: No acute cardiopulmonary disease.   Electronically Signed   By: Lucrezia Europe M.D.   On: 11/29/2014 12:50   US Paracentesis  11/29/2014   CLINICAL DATA:  Abdominal distention, ascites. Request diagnostic and therapeutic paracentesis of up to 5 L max  EXAM: ULTRASOUND GUIDED PARACENTESIS  COMPARISON:  Previous paracentesis  PROCEDURE: An ultrasound guided paracentesis was thoroughly discussed with the patient and questions answered. The benefits, risks, alternatives and complications were also discussed. The patient understands and wishes to proceed with the procedure. Written consent was obtained.  Ultrasound was performed to localize and mark an adequate pocket of fluid in the left lower quadrant of the abdomen. The area was then prepped and draped in the normal sterile fashion. 1%  Lidocaine was used for local anesthesia. Under ultrasound guidance a Safe-T-Centesis catheter was introduced. Paracentesis was performed. The catheter was removed and a dressing applied.  COMPLICATIONS: None immediate  FINDINGS: A total of approximately 5 L of . Clear yellow. fluid was removed. A fluid sample was sent for laboratory analysis.  IMPRESSION: Successful ultrasound guided paracentesis yielding 5 L of ascites.  Read by: Ascencion Dike PA-C   Electronically Signed   By: Marybelle Killings M.D.   On: 11/29/2014 14:07     EKG Interpretation   Date/Time:  Saturday November 29 2014 11:17:56 EDT Ventricular Rate:  96 PR Interval:  167 QRS Duration: 88 QT Interval:  374 QTC Calculation: 473 R Axis:   15 Text Interpretation:  Sinus rhythm Low voltage, precordial leads No  significant change was found Confirmed by Wyvonnia Dusky  MD, Eduardo Honor ([id number]) on  11/29/2014 11:31:02 AM      MDM   Final diagnoses:  Gastrointestinal hemorrhage associated with gastritis, unspecified gastritis  Ascites   abdominal leg swelling with history of recurrent ascites and cirrhosis. Left leg appears more swollen than right. We'll obtain Dopplers.  Lungs are clear. Chest x-rays negative. Hemoglobin slightly decreased 7.2 from baseline in the mid eights. Patient does endorse black stools. Ammonia not significantly elevated.  Dopplers negative for DVT. Heme occult is positive.  Patient feels improved after paracentesis of 5 L. No difficulty breathing or chest pain. No dizziness.  EGD 2016 showed gastropathy and esophageal varices. Hemoglobin has trended down over past month, now with melena.  D/w Dr. Coralyn Pear who will admit.  Vitals stable in the ED.  Protonix given.  Dr. Michail Sermon of GI will consult.  Ezequiel Essex, MD 11/29/14 [phone]

## 2014-11-29 NOTE — H&P (Signed)
[facility] History and Physical  [name] IZT:[id number] DOB: 07/28/1955 DOA: 11/29/2014  Referring physician:  PCP: Barbette Merino, MD   Chief Complaint: Abdominal distention, black tarry stools  HPI: [name] is a 59 y.o. male with a past medical history of alcoholic cirrhosis, ascites, history recurrent hospitalizations for hepatic encephalopathy, diastolic congestive heart failure, type 2 diabetes mellitus, who was recently discharged from the medicine service on 11/13/2014 at which time he was treated for hepatic encephalopathy. He presented with an ammonia level of 228. Patient presenting to the emergency department today with complaints of abdominal distention. In the emergency department he underwent ultrasound-guided paracentesis with removal of 5 L of clear yellow fluid. There are no media complications patient tolerated procedure well. Patient also complains of black tarry stools which he has noticed the past 24 hours. Lab work performed in the emergency department showing a hemoglobin of 7.2, down from 8.5 on 11/13/2014. Patient undergoing his last upper endoscopy on 08/30/2014 at which time he was found to have mild to moderate portal gastropathy with minimal nonbloody esophageal varices. He denies hematemesis, bloody stools, chest pain, shortness of breath, dizziness, lightheadedness.                                                                                                                     Review of Systems:  Constitutional:  No weight loss, night sweats, Fevers, chills, fatigue.  HEENT:  No headaches, Difficulty swallowing,Tooth/dental problems,Sore throat,  No sneezing, itching, ear ache, nasal congestion, post nasal drip,  Cardio-vascular:  No chest pain, Orthopnea, PND, swelling in lower extremities, anasarca, dizziness, palpitations  GI:  No heartburn, indigestion, abdominal pain, nausea, vomiting, diarrhea, change in bowel habits, loss of appetite  positive for abdominal distention Resp:  No shortness of breath with exertion or at rest. No excess mucus, no productive cough, No non-productive cough, No coughing up of blood.No change in color of mucus.No wheezing.No chest wall deformity  Skin:  no rash or lesions.  GU:  no dysuria, change in color of urine, no urgency or frequency. No flank pain.  Musculoskeletal:  No joint pain or swelling. No decreased range of motion. No back pain. Positive for lower extremity swelling Psych:  No change in mood or affect. No depression or anxiety. No memory loss.   Past Medical History  Diagnosis Date  . Neuropathy   . Diabetes mellitus   . Bipolar affect, depressed   . Hypertension   . Arthritis   . Stroke     Mini stroke about 38yrs ago  . Cirrhosis   . Alcohol abuse   . Chronic pain   . Cocaine abuse   . Muscle spasm     both legs  . Encephalopathy, hepatic   . Detached retina   . COPD (chronic obstructive pulmonary disease)     emphysema  . Bronchitis   . Barrett's esophagus   . GERD (gastroesophageal reflux disease)     has ulcer  . Anemia  Past Surgical History  Procedure Laterality Date  . Fracture surgery      Leg and arm 38yrs ago  . Esophagogastroduodenoscopy  04/04/2012    Procedure: ESOPHAGOGASTRODUODENOSCOPY (EGD);  Surgeon: Irene Shipper, MD;  Location: [facility] ENDOSCOPY;  Service: Endoscopy;  Laterality: N/A;  . Esophagogastroduodenoscopy Left 03/13/2013    Procedure: ESOPHAGOGASTRODUODENOSCOPY (EGD);  Surgeon: Arta Silence, MD;  Location: [facility] ENDOSCOPY;  Service: Endoscopy;  Laterality: Left;  Marland Kitchen Eye surgery  8 months ago both eyes    cataracts both eyes, detached eye, gas pocket  . Vasectomy    . Pars plana vitrectomy Left 07/08/2013    Procedure: PARS PLANA VITRECTOMY WITH 25 GAUGE;  Surgeon: Hurman Horn, MD;  Location: [place];  Service: Ophthalmology;  Laterality: Left;  . Intraocular lens removal Left 07/08/2013    Procedure: REMOVAL OF INTRAOCULAR LENS;   Surgeon: Hurman Horn, MD;  Location: [place];  Service: Ophthalmology;  Laterality: Left;  . Placement and suture of secondary intraocular lens Left 07/08/2013    Procedure: PLACEMENT AND SUTURE OF SECONDARY INTRAOCULAR LENS;  Surgeon: Hurman Horn, MD;  Location: [place];  Service: Ophthalmology;  Laterality: Left;  Insertion of Anterior Capsule Intraocular Lens   . Esophagogastroduodenoscopy N/A 01/16/2014    Procedure: ESOPHAGOGASTRODUODENOSCOPY (EGD);  Surgeon: Winfield Cunas., MD;  Location: [facility] ENDOSCOPY;  Service: Endoscopy;  Laterality: N/A;  . Colonoscopy N/A 01/17/2014    Procedure: COLONOSCOPY;  Surgeon: Winfield Cunas., MD;  Location: [facility] ENDOSCOPY;  Service: Endoscopy;  Laterality: N/A;  possible banding  . Esophagogastroduodenoscopy N/A 08/30/2014    Procedure: ESOPHAGOGASTRODUODENOSCOPY (EGD);  Surgeon: Wilford Corner, MD;  Location: [facility] ENDOSCOPY;  Service: Endoscopy;  Laterality: N/A;  bedside   Social History:  reports that he has been smoking Cigarettes.  He has a 30 pack-year smoking history. He has never used smokeless tobacco. He reports that he drinks alcohol. He reports that he uses illicit drugs (Cocaine).  No Known Allergies  Family History  Problem Relation Age of Onset  . Hypotension Mother      Prior to Admission medications   Medication Sig Start Date End Date Taking? Authorizing Provider  amitriptyline (ELAVIL) 25 MG tablet Take 1 tablet (25 mg total) by mouth at bedtime. 10/05/14   Nita Sells, MD  CALCIUM-VITAMIN D PO Take 100 Units by mouth daily.    Historical Provider, MD  clonazePAM (KLONOPIN) 0.25 MG disintegrating tablet Take 0.25 mg by mouth 2 (two) times daily.  10/17/14   Historical Provider, MD  diazepam (VALIUM) 5 MG tablet TAKE 1 TABLET TWICE A DAY 10/17/14   Historical Provider, MD  diclofenac sodium (VOLTAREN) 1 % GEL Apply 4 g topically as needed (for back of leg pain).    Historical Provider, MD  feeding supplement, ENSURE ENLIVE,  (ENSURE ENLIVE) LIQD Take 237 mLs by mouth 2 (two) times daily between meals. 11/13/14   Maryann Mikhail, DO  folic acid (FOLVITE) 1 MG tablet Take 1 tablet (1 mg total) by mouth daily. 10/05/14   Nita Sells, MD  furosemide (LASIX) 40 MG tablet Take 40 mg by mouth daily.  10/25/14   Historical Provider, MD  insulin aspart (NOVOLOG FLEXPEN) 100 UNIT/ML FlexPen Inject 6 Units into the skin 3 (three) times daily with meals. For diabetes Patient taking differently: Inject 6-45 Units into the skin 2 (two) times daily. 6 units in the morning and 45 units in the evening 02/19/14   Encarnacion Slates, NP  insulin glargine (LANTUS)  100 UNIT/ML injection Inject 45 Units into the skin at bedtime.    Historical Provider, MD  lactulose (CHRONULAC) 10 GM/15ML solution Take 45 mLs (30 g total) by mouth 3 (three) times daily. Patient taking differently: Take 20 g by mouth 3 (three) times daily.  09/03/14   Reyne Dumas, MD  lisinopril (PRINIVIL,ZESTRIL) 20 MG tablet Take 20 mg by mouth daily.  08/21/14   Historical Provider, MD  magnesium oxide (MAG-OX) 400 (241.3 MG) MG tablet Take 1 tablet (400 mg total) by mouth 2 (two) times daily. 09/03/14   Reyne Dumas, MD  Multiple Vitamin (MULTIVITAMIN WITH MINERALS) TABS tablet Take 1 tablet by mouth daily. 07/31/14   Geradine Girt, DO  oxyCODONE (OXY IR/ROXICODONE) 5 MG immediate release tablet Take 5 mg by mouth every 8 (eight) hours. 09/24/14   Historical Provider, MD  pantoprazole (PROTONIX) 40 MG tablet Take 40 mg by mouth daily.  07/09/14   Historical Provider, MD  propranolol (INDERAL) 20 MG tablet Take 20 mg by mouth 2 (two) times daily. 09/24/14   Historical Provider, MD  spironolactone (ALDACTONE) 25 MG tablet Take 25 mg by mouth 3 (three) times daily.  10/27/14   Historical Provider, MD  XIFAXAN 550 MG TABS tablet Take 550 mg by mouth 2 (two) times daily.  10/29/14   Historical Provider, MD   Physical Exam: Filed Vitals:   11/29/14 1403 11/29/14 1445 11/29/14 1500  11/29/14 1610  BP: 118/51 137/54 126/50 121/51  Pulse:  87 81 81  Temp:    98.7 F (37.1 C)  TempSrc:    Oral  Resp:  14 17 16   Height:      Weight:      SpO2:  100% 100% 100%    Wt Readings from Last 3 Encounters:  11/29/14 105.643 kg (232 lb 14.4 oz)  11/10/14 102.2 kg (225 lb 5 oz)  10/06/14 99.338 kg (219 lb)    General:  Appears calm and comfortable, actually states feeling better after undergoing paracentesis, ischemic feet and go home tomorrow. Pale-appearing Eyes: PERRL, normal lids, irises & pale conjunctiva ENT: grossly normal hearing, lips & tongue, dry oral mucosa Neck: no LAD, masses or thyromegaly Cardiovascular: RRR, no m/r/g. Has 2-3+ lower extremity pitting edema Telemetry: SR, no arrhythmias  Respiratory: CTA bilaterally, no w/r/r. Normal respiratory effort. Abdomen: soft, ntnd Skin: no rash or induration seen on limited exam Musculoskeletal: Has 2-3+ lower extremity pitting edema Psychiatric: grossly normal mood and affect, speech fluent and appropriate Neurologic: grossly non-focal.          Labs on Admission:  Basic Metabolic Panel:  Recent Labs Lab 11/29/14 1145  NA 127*  K 4.9  CL 100*  CO2 20*  GLUCOSE 94  BUN 35*  CREATININE 1.40*  CALCIUM 7.8*   Liver Function Tests:  Recent Labs Lab 11/29/14 1145  AST 114*  ALT 74*  ALKPHOS 105  BILITOT 3.3*  PROT 5.5*  ALBUMIN 2.3*   No results for input(s): LIPASE, AMYLASE in the last 168 hours.  Recent Labs Lab 11/29/14 1145  AMMONIA 47*   CBC:  Recent Labs Lab 11/29/14 1145  WBC 5.5  NEUTROABS 4.1  HGB 7.2*  HCT 20.8*  MCV 95.0  PLT 98*   Cardiac Enzymes: No results for input(s): CKTOTAL, CKMB, CKMBINDEX, TROPONINI in the last 168 hours.  BNP (last 3 results)  Recent Labs  08/14/14 1910 11/29/14 1145  BNP 29.3 29.4    ProBNP (last 3 results)  Recent Labs  02/08/14  1830 02/10/14 1547  PROBNP 966.3* 805.5*    CBG:  Recent Labs Lab 11/29/14 1422    GLUCAP 100*    Radiological Exams on Admission: Dg Chest 2 View  11/29/2014   CLINICAL DATA:  Lower extremity edema, abdominal distention x1 week  EXAM: CHEST - 2 VIEW  COMPARISON:  11/10/2014  FINDINGS: Improved aeration. Lungs are clear. Heart size normal. No pneumothorax. No effusion. Visualized skeletal structures are unremarkable.  IMPRESSION: No acute cardiopulmonary disease.   Electronically Signed   By: Lucrezia Europe M.D.   On: 11/29/2014 12:50   US Paracentesis  11/29/2014   CLINICAL DATA:  Abdominal distention, ascites. Request diagnostic and therapeutic paracentesis of up to 5 L max  EXAM: ULTRASOUND GUIDED PARACENTESIS  COMPARISON:  Previous paracentesis  PROCEDURE: An ultrasound guided paracentesis was thoroughly discussed with the patient and questions answered. The benefits, risks, alternatives and complications were also discussed. The patient understands and wishes to proceed with the procedure. Written consent was obtained.  Ultrasound was performed to localize and mark an adequate pocket of fluid in the left lower quadrant of the abdomen. The area was then prepped and draped in the normal sterile fashion. 1% Lidocaine was used for local anesthesia. Under ultrasound guidance a Safe-T-Centesis catheter was introduced. Paracentesis was performed. The catheter was removed and a dressing applied.  COMPLICATIONS: None immediate  FINDINGS: A total of approximately 5 L of . Clear yellow. fluid was removed. A fluid sample was sent for laboratory analysis.  IMPRESSION: Successful ultrasound guided paracentesis yielding 5 L of ascites.  Read by: Ascencion Dike PA-C   Electronically Signed   By: Marybelle Killings M.D.   On: 11/29/2014 14:07    EKG: Independently reviewed.   Assessment/Plan Principal Problem:   GI bleed Active Problems:   Thrombocytopenia   Acute kidney injury   Alcoholic cirrhosis of liver with ascites   Acute blood loss anemia   1. Probable upper GI bleed. Patient having a  history of alcoholic cirrhosis, last EGD performed on 08/30/2014 which time was found to have mild to moderate portal gastropathy along with minimal nonbleeding esophageal varices. He presents with complaints of black tarry stools over the past 24 hours with a low hemoglobin of 7.2. ED provider discussed case with Dr.Schooler of gastroenterology will consult. He is hemodynamically stable and is mentating well. Will treat with Protonix 40 mg IV twice a day, admit to telemetry, await further recommendations from GI 2. Acute blood loss anemia. Patient having a downward trend in his hemoglobin to 7.2 reporting melanoma for the past 24 hours. Has a history of GI bleed, GI has been consulted. He was typed and crossed and will be transfused 2 units of packed red blood cells with 20 mg of IV Lasix to be given in between blood transfusions. Follow-up on a.m. CBC. 3. Alcoholic cirrhosis with meld score of 26. Patient denies current alcohol abuse. 5 L of ascitic fluid was removed in the emergency department with paracentesis. He presented with complaints of abdominal distention. On exam his abdomen is benign. We'll continue her diabetic therapy with spironolactone 25 mg 3 times a day and Lasix 40 mg by mouth daily. Will continue propranolol 20 mg by mouth twice a day. Will also continue rifaximin and lactulose. Patient having history of hepatic encephalopathy 4. Acute kidney injury. Labs showing a upward trend in creatinine to 1.4, as previously 0.95 on 11/13/2014. Could be related to hypovolemia. Patient is being transfused with 2 units packed red blood  cells today. Follow-up on a.m. Labs. 5. Hyponatremia. Labs showing sodium of 127. Likely related to cirrhosis. Continue diabetic therapy. 6. Hypertension. Given presence of acute kidney injury will discontinue ACE inhibitor therapy. All pressure stable in the emergency room with blood pressure 121/51. Will monitor blood pressures overnight 7. VT prophylaxis.  SCDs   Code Status: Full code Family Communication:  Disposition Plan: Admit to the inpatient service  Time spent: 65 minutes  Kelvin Cellar [facility] Pager [phone]

## 2014-11-29 NOTE — Procedures (Signed)
Successful US guided paracentesis from LLQ.  Yielded 5L max of clear yellow fluid.  No immediate complications.  Pt tolerated well.   Specimen was sent for labs.  Ascencion Dike PA-C 11/29/2014 1:46 PM

## 2014-11-29 NOTE — ED Notes (Signed)
Pt can eat per Dr. Wyvonnia Dusky

## 2014-11-29 NOTE — Progress Notes (Signed)
VASCULAR LAB PRELIMINARY  PRELIMINARY  PRELIMINARY  PRELIMINARY  Bilateral lower extremity venous Dopplers completed.    Preliminary report:  There is no DVT or SVT noted in the bilateral lower extremities.  There is significant interstitial fluid noted throughout the bilateral lower extremities. Doppler signals are pulsatile suggestive of fluid overload.  Nettie Cromwell, RVT 11/29/2014, 12:10 PM

## 2014-11-29 NOTE — ED Notes (Signed)
Pt reports that he has been experiencing abd swelling/pain and leg swelling x 1 month. Pt also reports SOB. Pt has had recent admission for the same. Pt alert x4. NAD at this time.

## 2014-11-29 NOTE — ED Notes (Signed)
Patient transported to Ultrasound 

## 2014-11-29 NOTE — ED Notes (Signed)
MD at bedside. 

## 2014-11-30 ENCOUNTER — Encounter (HOSPITAL_COMMUNITY)
Admission: EM | Disposition: A | Discharge status: Home / Self Care | Payer: Self-pay | Source: Home / Self Care | Attending: Internal Medicine

## 2014-11-30 ENCOUNTER — Encounter (HOSPITAL_COMMUNITY): Payer: Self-pay

## 2014-11-30 DIAGNOSIS — K7031 Alcoholic cirrhosis of liver with ascites: Secondary | ICD-10-CM

## 2014-11-30 DIAGNOSIS — D62 Acute posthemorrhagic anemia: Secondary | ICD-10-CM

## 2014-11-30 DIAGNOSIS — I5032 Chronic diastolic (congestive) heart failure: Secondary | ICD-10-CM | POA: Diagnosis present

## 2014-11-30 DIAGNOSIS — G9340 Encephalopathy, unspecified: Secondary | ICD-10-CM

## 2014-11-30 DIAGNOSIS — N179 Acute kidney failure, unspecified: Secondary | ICD-10-CM

## 2014-11-30 HISTORY — PX: ESOPHAGOGASTRODUODENOSCOPY: SHX5428

## 2014-11-30 LAB — BASIC METABOLIC PANEL
ANION GAP: 5 (ref 5–15)
BUN: 34 mg/dL — AB (ref 6–20)
CALCIUM: 7.3 mg/dL — AB (ref 8.9–10.3)
CHLORIDE: 99 mmol/L — AB (ref 101–111)
CO2: 21 mmol/L — AB (ref 22–32)
Creatinine, Ser: 1.48 mg/dL — ABNORMAL HIGH (ref 0.61–1.24)
GFR calc Af Amer: 58 mL/min — ABNORMAL LOW (ref >60)
GFR calc non Af Amer: 50 mL/min — ABNORMAL LOW (ref >60)
GLUCOSE: 100 mg/dL — AB (ref 65–99)
POTASSIUM: 5.2 mmol/L — AB (ref 3.5–5.1)
Sodium: 125 mmol/L — ABNORMAL LOW (ref 135–145)

## 2014-11-30 LAB — CBC
HCT: 19.4 % — ABNORMAL LOW (ref 39.0–52.0)
Hemoglobin: 6.7 g/dL — CL (ref 13.0–17.0)
MCH: 32.4 pg (ref 26.0–34.0)
MCHC: 34.5 g/dL (ref 30.0–36.0)
MCV: 93.7 fL (ref 78.0–100.0)
PLATELETS: 84 10*3/uL — AB (ref 150–400)
RBC: 2.07 MIL/uL — ABNORMAL LOW (ref 4.22–5.81)
RDW: 17.3 % — AB (ref 11.5–15.5)
WBC: 6.5 10*3/uL (ref 4.0–10.5)

## 2014-11-30 LAB — PREPARE RBC (CROSSMATCH)

## 2014-11-30 LAB — GLUCOSE, CAPILLARY
GLUCOSE-CAPILLARY: 95 mg/dL (ref 65–99)
Glucose-Capillary: 94 mg/dL (ref 65–99)

## 2014-11-30 LAB — AMMONIA: AMMONIA: 176 umol/L — AB (ref 9–35)

## 2014-11-30 SURGERY — EGD (ESOPHAGOGASTRODUODENOSCOPY)
Anesthesia: Moderate Sedation

## 2014-11-30 MED ORDER — MIDAZOLAM HCL 5 MG/ML IJ SOLN
INTRAMUSCULAR | Status: AC
Start: 2014-11-30 — End: 2014-11-30
  Filled 2014-11-30: qty 2

## 2014-11-30 MED ORDER — FENTANYL CITRATE (PF) 100 MCG/2ML IJ SOLN
INTRAMUSCULAR | Status: DC | PRN
  Administered 2014-11-30: 25 ug via INTRAVENOUS

## 2014-11-30 MED ORDER — MIDAZOLAM HCL 10 MG/2ML IJ SOLN
INTRAMUSCULAR | Status: DC | PRN
  Administered 2014-11-30: 2 mg via INTRAVENOUS

## 2014-11-30 MED ORDER — SODIUM CHLORIDE 0.9 % IV SOLN: Freq: Once | INTRAVENOUS | Status: DC

## 2014-11-30 MED ORDER — CIPROFLOXACIN IN D5W 400 MG/200ML IV SOLN
400.0000 mg | Freq: Three times a day (TID) | INTRAVENOUS | Status: DC
  Administered 2014-11-30 – 2014-12-02 (×6): 400 mg via INTRAVENOUS
  Filled 2014-11-30 (×9): qty 200

## 2014-11-30 MED ORDER — SPIRONOLACTONE 50 MG PO TABS
50.0000 mg | ORAL_TABLET | Freq: Every day | ORAL | Status: DC
  Administered 2014-12-01 – 2014-12-04 (×4): 50 mg via ORAL
  Filled 2014-11-30 (×4): qty 1

## 2014-11-30 MED ORDER — SODIUM CHLORIDE 0.9 % IV SOLN
12.5000 ug/h | INTRAVENOUS | Status: DC
  Administered 2014-11-30 – 2014-12-01 (×2): 25 ug/h via INTRAVENOUS
  Administered 2014-12-02: 12.5 ug/h via INTRAVENOUS
  Filled 2014-11-30 (×5): qty 1

## 2014-11-30 MED ORDER — FENTANYL CITRATE (PF) 100 MCG/2ML IJ SOLN
INTRAMUSCULAR | Status: AC
  Filled 2014-11-30: qty 2

## 2014-11-30 NOTE — Consult Note (Signed)
Referring Provider: Dr. Wyvonnia Dusky Primary Care Physician:  Barbette Merino, MD Primary Gastroenterologist:  UNASSIGNED  Reason for Consultation:  Melena; Anemia  HPI: [name] is a 59 y.o. male with a history of decompensated alcoholic cirrhosis with ascites and recurrent admissions for hepatic encephalopathy who had an EGD in April 2016 (done for hematemesis in the setting of being found unresponsive with cocaine and benzos in his urine drug screen in April) that showed moderated portal hypertensive gastropathy and minimal esophageal varices presents with a report of black tarry stools for the past day per admit note. He tells me he has had intermittent black stools for the past month and has been on iron pills. Last drank alcohol last weekend but cannot tell me how much he dranks. Admits to NSAIDs but does not know which ones or how much. Has been having progressive abdominal distention and LE edema. He had a paracentesis in June where 5.8 L was removed and 5 L was removed yesterday on paracentesis. Denies abdominal pain, nausea, vomiting, hematochezia, or hematemesis. Hgb 7.2 on admit (8.5 last month). Oriented X 4 during my evaluation but is slow to respond to medication questions and questions about his alcohol use. 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 No family in the room.     Past Medical History  Diagnosis Date  . Neuropathy   . Diabetes mellitus   . Bipolar affect, depressed   . Hypertension   . Arthritis   . Stroke     Mini stroke about 54yrs ago  . Cirrhosis   . Alcohol abuse   . Chronic pain   . Cocaine abuse   . Muscle spasm     both legs  . Encephalopathy, hepatic   . Detached retina   . COPD (chronic obstructive pulmonary disease)     emphysema  . Bronchitis   . Barrett's esophagus   . GERD (gastroesophageal reflux disease)     has ulcer  . Anemia     Past Surgical History  Procedure Laterality Date  . Fracture surgery      Leg and  arm 71yrs ago  . Esophagogastroduodenoscopy  04/04/2012    Procedure: ESOPHAGOGASTRODUODENOSCOPY (EGD);  Surgeon: Irene Shipper, MD;  Location: [facility] ENDOSCOPY;  Service: Endoscopy;  Laterality: N/A;  . Esophagogastroduodenoscopy Left 03/13/2013    Procedure: ESOPHAGOGASTRODUODENOSCOPY (EGD);  Surgeon: Arta Silence, MD;  Location: [facility] ENDOSCOPY;  Service: Endoscopy;  Laterality: Left;  Marland Kitchen Eye surgery  8 months ago both eyes    cataracts both eyes, detached eye, gas pocket  . Vasectomy    . Pars plana vitrectomy Left 07/08/2013    Procedure: PARS PLANA VITRECTOMY WITH 25 GAUGE;  Surgeon: Hurman Horn, MD;  Location: [facility];  Service: Ophthalmology;  Laterality: Left;  . Intraocular lens removal Left 07/08/2013    Procedure: REMOVAL OF INTRAOCULAR LENS;  Surgeon: Hurman Horn, MD;  Location: [place];  Service: Ophthalmology;  Laterality: Left;  . Placement and suture of secondary intraocular lens Left 07/08/2013    Procedure: PLACEMENT AND SUTURE OF SECONDARY INTRAOCULAR LENS;  Surgeon: Hurman Horn, MD;  Location: [place];  Service: Ophthalmology;  Laterality: Left;  Insertion of Anterior Capsule Intraocular Lens   . Esophagogastroduodenoscopy N/A 01/16/2014    Procedure: ESOPHAGOGASTRODUODENOSCOPY (EGD);  Surgeon: Winfield Cunas., MD;  Location: [facility] ENDOSCOPY;  Service: Endoscopy;  Laterality: N/A;  . Colonoscopy N/A 01/17/2014    Procedure: COLONOSCOPY;  Surgeon: Winfield Cunas., MD;  Location: [facility] ENDOSCOPY;  Service: Endoscopy;  Laterality: N/A;  possible banding  . Esophagogastroduodenoscopy N/A 08/30/2014    Procedure: ESOPHAGOGASTRODUODENOSCOPY (EGD);  Surgeon: Wilford Corner, MD;  Location: [facility] ENDOSCOPY;  Service: Endoscopy;  Laterality: N/A;  bedside    Prior to Admission medications   Medication Sig Start Date End Date Taking? Authorizing Provider  clonazePAM (KLONOPIN) 0.25 MG disintegrating tablet Take 0.25 mg by mouth 2 (two) times daily.  10/17/14  Yes Historical Provider, MD   diazepam (VALIUM) 5 MG tablet TAKE 1 TABLET TWICE A DAY 10/17/14  Yes Historical Provider, MD  LANTUS SOLOSTAR 100 UNIT/ML Solostar Pen Inject 45 Units into the skin daily at 10 pm.  11/07/14  Yes Historical Provider, MD  Oxycodone HCl 10 MG TABS Take 10 mg by mouth 3 (three) times daily. 11/14/14  Yes Historical Provider, MD  amitriptyline (ELAVIL) 25 MG tablet Take 1 tablet (25 mg total) by mouth at bedtime. 10/05/14   Nita Sells, MD  CALCIUM-VITAMIN D PO Take 100 Units by mouth daily.    Historical Provider, MD  diclofenac sodium (VOLTAREN) 1 % GEL Apply 4 g topically as needed (for back of leg pain).    Historical Provider, MD  feeding supplement, ENSURE ENLIVE, (ENSURE ENLIVE) LIQD Take 237 mLs by mouth 2 (two) times daily between meals. 11/13/14   Maryann Mikhail, DO  folic acid (FOLVITE) 1 MG tablet Take 1 tablet (1 mg total) by mouth daily. 10/05/14   Nita Sells, MD  furosemide (LASIX) 40 MG tablet Take 40 mg by mouth daily.  10/25/14   Historical Provider, MD  insulin aspart (NOVOLOG FLEXPEN) 100 UNIT/ML FlexPen Inject 6 Units into the skin 3 (three) times daily with meals. For diabetes Patient taking differently: Inject 6 Units into the skin 3 (three) times daily with meals. Not needed if Lantus is used. 02/19/14   Encarnacion Slates, NP  lactulose (CHRONULAC) 10 GM/15ML solution Take 45 mLs (30 g total) by mouth 3 (three) times daily. Patient taking differently: Take 20 g by mouth 3 (three) times daily.  09/03/14   Reyne Dumas, MD  lisinopril (PRINIVIL,ZESTRIL) 20 MG tablet Take 20 mg by mouth daily.  08/21/14   Historical Provider, MD  lisinopril (PRINIVIL,ZESTRIL) 20 MG tablet Take 10 mg by mouth daily.    Historical Provider, MD  magnesium oxide (MAG-OX) 400 (241.3 MG) MG tablet Take 1 tablet (400 mg total) by mouth 2 (two) times daily. 09/03/14   Reyne Dumas, MD  Multiple Vitamin (MULTIVITAMIN WITH MINERALS) TABS tablet Take 1 tablet by mouth daily. 07/31/14   Geradine Girt, DO   oxyCODONE (OXY IR/ROXICODONE) 5 MG immediate release tablet Take 5 mg by mouth every 8 (eight) hours. 09/24/14   Historical Provider, MD  pantoprazole (PROTONIX) 40 MG tablet Take 40 mg by mouth daily.  07/09/14   Historical Provider, MD  propranolol (INDERAL) 20 MG tablet Take 20 mg by mouth 2 (two) times daily. 09/24/14   Historical Provider, MD  spironolactone (ALDACTONE) 100 MG tablet Take 100 mg by mouth daily. 10/17/14   Historical Provider, MD  spironolactone (ALDACTONE) 25 MG tablet Take 25 mg by mouth 3 (three) times daily.  10/27/14   Historical Provider, MD  spironolactone (ALDACTONE) 50 MG tablet Take 100 mg by mouth daily.    Historical Provider, MD  thiamine (VITAMIN B-1) 100 MG tablet Take 100 mg by mouth daily.    Historical Provider, MD  XIFAXAN 550 MG TABS tablet Take 550 mg by mouth 2 (two)  times daily.  10/29/14   Historical Provider, MD    Scheduled Meds: . sodium chloride   Intravenous Once  . amitriptyline  25 mg Oral QHS  . clonazePAM  0.25 mg Oral BID  . folic acid  1 mg Oral Daily  . furosemide  40 mg Oral Daily  . insulin glargine  45 Units Subcutaneous QHS  . lactulose  30 g Oral TID  . magnesium oxide  400 mg Oral BID  . multivitamin with minerals  1 tablet Oral Daily  . oxyCODONE  5 mg Oral 3 times per day  . pantoprazole (PROTONIX) IV  40 mg Intravenous Q12H  . propranolol  20 mg Oral BID  . rifaximin  550 mg Oral BID  . sodium chloride  3 mL Intravenous Q12H  . sodium chloride  3 mL Intravenous Q12H  . spironolactone  25 mg Oral TID WC   Continuous Infusions:  PRN Meds:.sodium chloride, alum & mag hydroxide-simeth, ondansetron **OR** ondansetron (ZOFRAN) IV, sodium chloride  Allergies as of 11/29/2014  . (No Known Allergies)    Family History  Problem Relation Age of Onset  . Hypotension Mother     History   Social History  . Marital Status: Divorced    Spouse Name: N/A  . Number of Children: N/A  . Years of Education: N/A   Occupational History   . Not on file.   Social History Main Topics  . Smoking status: Current Every Day Smoker -- 1.00 packs/day for 30 years    Types: Cigarettes  . Smokeless tobacco: Never Used  . Alcohol Use: 0.0 oz/week     Comment: 12 pk beer daily  06/2013 - no alcohol since 11/2012  . Drug Use: Yes    Special: Cocaine     Comment: 1 gram weekly  06/2013 - has not had any for a year  . Sexual Activity: Yes    Birth Control/ Protection: None   Other Topics Concern  . Not on file   Social History Narrative    Review of Systems: All negative except as stated above in HPI.  Physical Exam: Vital signs: Filed Vitals:   11/30/14 0632  BP: 95/50  Pulse: 64  Temp: 98.2 F (36.8 C)  Resp: 16   Last BM Date: 11/29/14 General:   Lethargic, oriented X 4, no acute distress Head: atraumatic Eyes: anicteric, pupils equal and reactive ENT: oropharynx clear Neck: supple, nontender Lungs:  Clear throughout to auscultation.   No wheezes, crackles, or rhonchi. No acute distress. Heart:  Regular rate and rhythm; no murmurs, clicks, rubs,  or gallops. Abdomen: distended, nontender, +BS  Rectal:  Deferred Ext: 2+ - 3 + pitting LE edema Neuro: +asterixis  GI:  Lab Results:  Recent Labs  11/29/14 1145 11/30/14 0419  WBC 5.5 6.5  HGB 7.2* 6.7*  HCT 20.8* 19.4*  PLT 98* 84*   BMET  Recent Labs  11/29/14 1145 11/30/14 0419  NA 127* 125*  K 4.9 5.2*  CL 100* 99*  CO2 20* 21*  GLUCOSE 94 100*  BUN 35* 34*  CREATININE 1.40* 1.48*  CALCIUM 7.8* 7.3*   LFT  Recent Labs  11/29/14 1145  PROT 5.5*  ALBUMIN 2.3*  AST 114*  ALT 74*  ALKPHOS 105  BILITOT 3.3*   PT/INR  Recent Labs  11/29/14 1145  LABPROT 18.9*  INR 1.58*     Studies/Results: Dg Chest 2 View  11/29/2014   CLINICAL DATA:  Lower extremity edema, abdominal distention  x1 week  EXAM: CHEST - 2 VIEW  COMPARISON:  11/10/2014  FINDINGS: Improved aeration. Lungs are clear. Heart size normal. No pneumothorax. No effusion.  Visualized skeletal structures are unremarkable.  IMPRESSION: No acute cardiopulmonary disease.   Electronically Signed   By: Lucrezia Europe M.D.   On: 11/29/2014 12:50   US Paracentesis  11/29/2014   CLINICAL DATA:  Abdominal distention, ascites. Request diagnostic and therapeutic paracentesis of up to 5 L max  EXAM: ULTRASOUND GUIDED PARACENTESIS  COMPARISON:  Previous paracentesis  PROCEDURE: An ultrasound guided paracentesis was thoroughly discussed with the patient and questions answered. The benefits, risks, alternatives and complications were also discussed. The patient understands and wishes to proceed with the procedure. Written consent was obtained.  Ultrasound was performed to localize and mark an adequate pocket of fluid in the left lower quadrant of the abdomen. The area was then prepped and draped in the normal sterile fashion. 1% Lidocaine was used for local anesthesia. Under ultrasound guidance a Safe-T-Centesis catheter was introduced. Paracentesis was performed. The catheter was removed and a dressing applied.  COMPLICATIONS: None immediate  FINDINGS: A total of approximately 5 L of . Clear yellow. fluid was removed. A fluid sample was sent for laboratory analysis.  IMPRESSION: Successful ultrasound guided paracentesis yielding 5 L of ascites.  Read by: Ascencion Dike PA-C   Electronically Signed   By: Marybelle Killings M.D.   On: 11/29/2014 14:07    Impression/Plan: 59 yo with intermittent black stools and anemia in the setting of decompensated alcoholic cirrhosis with recurrent ascites and hepatic encephalopathy. EGD needed to look for source of his anemia. Doubt variceal bleed. Supportive care. May need Octreotide pending EGD's results. Continue IV PPI Q 12 hours. NPO.   LOS: 1 day   [place] C.  11/30/2014, 8:39 AM  Pager [phone]  If no answer or after 5 PM call [phone]

## 2014-11-30 NOTE — Progress Notes (Signed)
Pt with increasing lethargy and confusion. Dr. [place] Pink notified and order for ammonia level when pt returns from EGD.

## 2014-11-30 NOTE — Interval H&P Note (Signed)
History and Physical Interval Note:  11/30/2014 11:02 AM  [name]  has presented today for surgery, with the diagnosis of Bleeding  The various methods of treatment have been discussed with the patient and family. After consideration of risks, benefits and other options for treatment, the patient has consented to  Procedure(s): ESOPHAGOGASTRODUODENOSCOPY (EGD) (N/A) as a surgical intervention .  The patient's history has been reviewed, patient examined, no change in status, stable for surgery.  I have reviewed the patient's chart and labs.  Questions were answered to the patient's satisfaction.     [place] C.

## 2014-11-30 NOTE — Progress Notes (Signed)
Dear Doctor: [name] This patient has been identified as a candidate for PICC for the following reason (s): IV therapy over 48 hours and restarts due to phlebitis and infiltration in 24 hours.  Multiple attempts to obtain present PIV requiring ultrasound guidance. If you agree, please write an order for the indicated device. For any questions contact the Vascular Access Team at [phone] if no answer, please leave a message.    Thank you for supporting the early vascular access assessment program.

## 2014-11-30 NOTE — Progress Notes (Signed)
PROGRESS NOTE  [name] PTW:[id number] DOB: 1956-05-06 DOA: 11/29/2014 PCP: Barbette Merino, MD  HPI/Recap of past 62 hours: 59 year old male with past medical history diastolic heart failure, diabetes mellitus and cirrhosis secondary to ongoing alcohol abuse admitted on 7/94 abdominal distention. Patient underwent paracentesis removing 5 L of fluid, but was kept in the hospital after he related black tarry bowel movements for the past 24 hours and a hemoglobin of 7.2, down from 8.5 almost 3 weeks ago.  Likely felt to have GI bleed, gastrology consulted. Hemoglobin down to 6.7 this morning and patient underwent endoscopy 7/10. Endoscopy noted moderate portal hypertrophy gastropathy with bleeding that spontaneous resolved and esophageal varices that were nonbleeding. Morning of procedure prior to endoscopy, patient noted to be much more somnolent. Ammonia level ordered and elevated at 176.Marland Kitchen Patient somewhat hypotensive postprocedure and started on more aggressive IV fluids and transferred to step down unit, especially in lieu of recommendation by GI for continuous octreotide drip. Patient himself is too somnolent to give me any subjective  Assessment/Plan: Principal Problem:   GI bleed secondary to esophageal varices/portal gastropathy: Continue octreotide drip, PPI. Placed in stepdown unit. Continue to follow hemoglobin Active Problems:    Tobacco abuse: Nicotine patch   Acute encephalopathy: Secondary to elevated ammonia. Repeat level at 176. Will try lactulose enemas until more awake and alert Patient's previous hospitalization was for this.     Alcoholic cirrhosis of liver with ascites and secondary thrombocytopenia: Monitoring closely.   Acute blood loss anemia causing acute kidney injury: Continue to monitor. Transfuse 1 unit packed red blood cells given hemoglobin down to 6.7 this morning   Chronic diastolic heart failure: Stable for now. Need to monitor given aggressive fluid  resuscitation   Code Status: Full code  Family Communication: Left message for family  Disposition Plan: Continue step down to blood pressure stabilized and off of octreotide drip   Consultants:  GI-schooler  Procedures:  Status post EGD on 7/10: Noting hypertensive portal gastropathy, esophageal varices  Status post paracentesis done 7/9:5 L removed  Antibiotics:  None   Objective: BP 106/52 mmHg  Pulse 55  Temp(Src) 98.4 F (36.9 C) (Oral)  Resp 18  Ht 5\' 11"  (1.803 m)  Wt 100.517 kg (221 lb 9.6 oz)  BMI 30.92 kg/m2  SpO2 98%  Intake/Output Summary (Last 24 hours) at 11/30/14 1322 Last data filed at 11/30/14 0958  Gross per 24 hour  Intake   1567 ml  Output    250 ml  Net   1317 ml   Filed Weights   11/29/14 1116 11/29/14 1240 11/29/14 2000  Weight: 102.059 kg (225 lb) 105.643 kg (232 lb 14.4 oz) 100.517 kg (221 lb 9.6 oz)    Exam:   General:  Somnolent  Cardiovascular: Regular rate and rhythm, Z0-Y1, 2/6 systolic ejection murmur  Respiratory: Clear to auscultation bilaterally  Abdomen: Soft, mild distention, nontender?, Normoactive bowel sounds  Musculoskeletal: No clubbing or cyanosis, trace edema   Data Reviewed: Basic Metabolic Panel:  Recent Labs Lab 11/29/14 1145 11/30/14 0419  NA 127* 125*  K 4.9 5.2*  CL 100* 99*  CO2 20* 21*  GLUCOSE 94 100*  BUN 35* 34*  CREATININE 1.40* 1.48*  CALCIUM 7.8* 7.3*   Liver Function Tests:  Recent Labs Lab 11/29/14 1145  AST 114*  ALT 74*  ALKPHOS 105  BILITOT 3.3*  PROT 5.5*  ALBUMIN 2.3*   No results for input(s): LIPASE, AMYLASE in the last 168 hours.  Recent Labs  Lab 11/29/14 1145 11/30/14 1215  AMMONIA 47* 176*   CBC:  Recent Labs Lab 11/29/14 1145 11/30/14 0419  WBC 5.5 6.5  NEUTROABS 4.1  --   HGB 7.2* 6.7*  HCT 20.8* 19.4*  MCV 95.0 93.7  PLT 98* 84*   Cardiac Enzymes:   No results for input(s): CKTOTAL, CKMB, CKMBINDEX, TROPONINI in the last 168 hours. BNP  (last 3 results)  Recent Labs  08/14/14 1910 11/29/14 1145  BNP 29.3 29.4    ProBNP (last 3 results)  Recent Labs  02/08/14 1830 02/10/14 1547  PROBNP 966.3* 805.5*    CBG:  Recent Labs Lab 11/29/14 1422  GLUCAP 100*    Recent Results (from the past 240 hour(s))  Gram stain     Status: None (Preliminary result)   Collection Time: 11/29/14  2:09 PM  Result Value Ref Range Status   Specimen Description PERITONEAL  Final   Special Requests NONE  Final   Gram Stain RARE POLYMORPHONUCLEAR NO ORGANISMS SEEN   Final   Report Status PENDING  Incomplete     Studies: US Paracentesis  11/29/2014   CLINICAL DATA:  Abdominal distention, ascites. Request diagnostic and therapeutic paracentesis of up to 5 L max  EXAM: ULTRASOUND GUIDED PARACENTESIS  COMPARISON:  Previous paracentesis  PROCEDURE: An ultrasound guided paracentesis was thoroughly discussed with the patient and questions answered. The benefits, risks, alternatives and complications were also discussed. The patient understands and wishes to proceed with the procedure. Written consent was obtained.  Ultrasound was performed to localize and mark an adequate pocket of fluid in the left lower quadrant of the abdomen. The area was then prepped and draped in the normal sterile fashion. 1% Lidocaine was used for local anesthesia. Under ultrasound guidance a Safe-T-Centesis catheter was introduced. Paracentesis was performed. The catheter was removed and a dressing applied.  COMPLICATIONS: None immediate  FINDINGS: A total of approximately 5 L of . Clear yellow. fluid was removed. A fluid sample was sent for laboratory analysis.  IMPRESSION: Successful ultrasound guided paracentesis yielding 5 L of ascites.  Read by: Ascencion Dike PA-C   Electronically Signed   By: Marybelle Killings M.D.   On: 11/29/2014 14:07    Scheduled Meds: . sodium chloride   Intravenous Once  . amitriptyline  25 mg Oral QHS  . ciprofloxacin  400 mg Intravenous 3  times per day  . clonazePAM  0.25 mg Oral BID  . folic acid  1 mg Oral Daily  . furosemide  40 mg Oral Daily  . insulin glargine  45 Units Subcutaneous QHS  . lactulose  30 g Oral TID  . magnesium oxide  400 mg Oral BID  . multivitamin with minerals  1 tablet Oral Daily  . oxyCODONE  5 mg Oral 3 times per day  . pantoprazole (PROTONIX) IV  40 mg Intravenous Q12H  . propranolol  20 mg Oral BID  . rifaximin  550 mg Oral BID  . sodium chloride  3 mL Intravenous Q12H  . sodium chloride  3 mL Intravenous Q12H  . spironolactone  50 mg Oral Daily    Continuous Infusions: . octreotide  (SANDOSTATIN)    IV infusion       Time spent: 35 min  [facility] Pager [phone]. If 7PM-7AM, please contact night-coverage at [email or web address], password [facility] 11/30/2014, 1:22 PM  LOS: 1 day

## 2014-11-30 NOTE — Op Note (Signed)
[facility] [place], [id number]   ENDOSCOPY PROCEDURE REPORT  PATIENT: Darrell, Baker  MR#: [id number] BIRTHDATE: 11/21/1955 , 16  yrs. old GENDER: male ENDOSCOPIST: Wilford Corner, MD REFERRED BY: PROCEDURE DATE:  December 16, 2014 PROCEDURE:  EGD, diagnostic ASA CLASS:     Class III INDICATIONS:  melena and anemia. MEDICATIONS: Fentanyl 25 mcg IV and Versed 2 mg IV TOPICAL ANESTHETIC: none  DESCRIPTION OF PROCEDURE: After the risks benefits and alternatives of the procedure were thoroughly explained, informed consent was obtained.  The    endoscope was introduced through the mouth and advanced to the second portion of the duodenum , Without limitations.  The instrument was slowly withdrawn as the mucosa was fully examined. Estimated blood loss is zero unless otherwise noted in this procedure report.    Small to medium-sized distal esophageal varices without bleeding stigmata. GEJ 42 cm from the incisors. Diffuse mosaic pattern of stomach consistent with portal hypertensive gastropathy (PHG). Segmental area of erythema in distal stomach consistent with moderate PHG. Oozing of blood occurred in several areas of the proximal stomach during the endoscopy due to friable mucosa. Duodenal bulb and 2nd portion of the duodenum normal in appearance. Bleeding from several areas of the [place] that spontaneously resolved.          The scope was then withdrawn from the patient and the procedure completed.  COMPLICATIONS: There were no immediate complications.  ENDOSCOPIC IMPRESSION:     Moderated Portal Hypertensive Gastropathy with active bleeding that spontaneously resolved (see above) Distal esophageal varices (no bleeding stigmata  RECOMMENDATIONS:     Octreotide drip; IV Abx; Supportive care; NPO except ice chips   eSigned:  Wilford Corner, MD 2014/12/16 11:25 AM    CC:  CPT CODES: ICD CODES:  The ICD and CPT codes recommended by  this software are interpretations from the data that the clinical staff has captured with the software.  The verification of the translation of this report to the ICD and CPT codes and modifiers is the sole responsibility of the health care institution and practicing physician where this report was generated.  [employer] will not be held responsible for the validity of the ICD and CPT codes included on this report.  AMA assumes no liability for data contained or not contained herein. CPT is a Designer, television/film set of the [employer].  PATIENT NAME:  Darrell, Baker MR#: [id number]

## 2014-11-30 NOTE — OR Nursing (Signed)
Pt. BP running 75/51 after procedure.  MD made aware.  Pt. Placed in reverse trendelenburg, another IV was started and fluids (NS) were wide open.  BP came up to 97/51 and 101/59 when pt. Arrived to the floor.  RN made aware of pt status. Pt. Responding to voice.    Laverta Baltimore, RN

## 2014-11-30 NOTE — H&P (View-Only) (Signed)
Referring Provider: Dr. Wyvonnia Dusky Primary Care Physician:  Barbette Merino, MD Primary Gastroenterologist:  UNASSIGNED  Reason for Consultation:  Melena; Anemia  HPI: [name] is a 59 y.o. male with a history of decompensated alcoholic cirrhosis with ascites and recurrent admissions for hepatic encephalopathy who had an EGD in April 2016 (done for hematemesis in the setting of being found unresponsive with cocaine and benzos in his urine drug screen in April) that showed moderated portal hypertensive gastropathy and minimal esophageal varices presents with a report of black tarry stools for the past day per admit note. He tells me he has had intermittent black stools for the past month and has been on iron pills. Last drank alcohol last weekend but cannot tell me how much he dranks. Admits to NSAIDs but does not know which ones or how much. Has been having progressive abdominal distention and LE edema. He had a paracentesis in June where 5.8 L was removed and 5 L was removed yesterday on paracentesis. Denies abdominal pain, nausea, vomiting, hematochezia, or hematemesis. Hgb 7.2 on admit (8.5 last month). Oriented X 4 during my evaluation but is slow to respond to medication questions and questions about his alcohol use. 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 No family in the room.     Past Medical History  Diagnosis Date  . Neuropathy   . Diabetes mellitus   . Bipolar affect, depressed   . Hypertension   . Arthritis   . Stroke     Mini stroke about 54yrs ago  . Cirrhosis   . Alcohol abuse   . Chronic pain   . Cocaine abuse   . Muscle spasm     both legs  . Encephalopathy, hepatic   . Detached retina   . COPD (chronic obstructive pulmonary disease)     emphysema  . Bronchitis   . Barrett's esophagus   . GERD (gastroesophageal reflux disease)     has ulcer  . Anemia     Past Surgical History  Procedure Laterality Date  . Fracture surgery      Leg and  arm 61yrs ago  . Esophagogastroduodenoscopy  04/04/2012    Procedure: ESOPHAGOGASTRODUODENOSCOPY (EGD);  Surgeon: Irene Shipper, MD;  Location[facility] ENDOSCOPY;  Service: Endoscopy;  Laterality: N/A;  . Esophagogastroduodenoscopy Left 03/13/2013    Procedure: ESOPHAGOGASTRODUODENOSCOPY (EGD);  Surgeon: Arta Silence, MD;  Location: [facility] ENDOSCOPY;  Service: Endoscopy;  Laterality: Left;  Marland Kitchen Eye surgery  8 months ago both eyes    cataracts both eyes, detached eye, gas pocket  . Vasectomy    . Pars plana vitrectomy Left 07/08/2013    Procedure: PARS PLANA VITRECTOMY WITH 25 GAUGE;  Surgeon: Hurman Horn, MD;  Location: [place];  Service: Ophthalmology;  Laterality: Left;  . Intraocular lens removal Left 07/08/2013    Procedure: REMOVAL OF INTRAOCULAR LENS;  Surgeon: Hurman Horn, MD;  Location: [place];  Service: Ophthalmology;  Laterality: Left;  . Placement and suture of secondary intraocular lens Left 07/08/2013    Procedure: PLACEMENT AND SUTURE OF SECONDARY INTRAOCULAR LENS;  Surgeon: Hurman Horn, MD;  Location: [place];  Service: Ophthalmology;  Laterality: Left;  Insertion of Anterior Capsule Intraocular Lens   . Esophagogastroduodenoscopy N/A 01/16/2014    Procedure: ESOPHAGOGASTRODUODENOSCOPY (EGD);  Surgeon: Winfield Cunas., MD;  Location: [facility] ENDOSCOPY;  Service: Endoscopy;  Laterality: N/A;  . Colonoscopy N/A 01/17/2014    Procedure: COLONOSCOPY;  Surgeon: Winfield Cunas., MD;  Location: [facility] ENDOSCOPY;  Service: Endoscopy;  Laterality: N/A;  possible banding  . Esophagogastroduodenoscopy N/A 08/30/2014    Procedure: ESOPHAGOGASTRODUODENOSCOPY (EGD);  Surgeon: Wilford Corner, MD;  Location: [facility] ENDOSCOPY;  Service: Endoscopy;  Laterality: N/A;  bedside    Prior to Admission medications   Medication Sig Start Date End Date Taking? Authorizing Provider  clonazePAM (KLONOPIN) 0.25 MG disintegrating tablet Take 0.25 mg by mouth 2 (two) times daily.  10/17/14  Yes Historical Provider, MD   diazepam (VALIUM) 5 MG tablet TAKE 1 TABLET TWICE A DAY 10/17/14  Yes Historical Provider, MD  LANTUS SOLOSTAR 100 UNIT/ML Solostar Pen Inject 45 Units into the skin daily at 10 pm.  11/07/14  Yes Historical Provider, MD  Oxycodone HCl 10 MG TABS Take 10 mg by mouth 3 (three) times daily. 11/14/14  Yes Historical Provider, MD  amitriptyline (ELAVIL) 25 MG tablet Take 1 tablet (25 mg total) by mouth at bedtime. 10/05/14   Nita Sells, MD  CALCIUM-VITAMIN D PO Take 100 Units by mouth daily.    Historical Provider, MD  diclofenac sodium (VOLTAREN) 1 % GEL Apply 4 g topically as needed (for back of leg pain).    Historical Provider, MD  feeding supplement, ENSURE ENLIVE, (ENSURE ENLIVE) LIQD Take 237 mLs by mouth 2 (two) times daily between meals. 11/13/14   Maryann Mikhail, DO  folic acid (FOLVITE) 1 MG tablet Take 1 tablet (1 mg total) by mouth daily. 10/05/14   Nita Sells, MD  furosemide (LASIX) 40 MG tablet Take 40 mg by mouth daily.  10/25/14   Historical Provider, MD  insulin aspart (NOVOLOG FLEXPEN) 100 UNIT/ML FlexPen Inject 6 Units into the skin 3 (three) times daily with meals. For diabetes Patient taking differently: Inject 6 Units into the skin 3 (three) times daily with meals. Not needed if Lantus is used. 02/19/14   Encarnacion Slates, NP  lactulose (CHRONULAC) 10 GM/15ML solution Take 45 mLs (30 g total) by mouth 3 (three) times daily. Patient taking differently: Take 20 g by mouth 3 (three) times daily.  09/03/14   Reyne Dumas, MD  lisinopril (PRINIVIL,ZESTRIL) 20 MG tablet Take 20 mg by mouth daily.  08/21/14   Historical Provider, MD  lisinopril (PRINIVIL,ZESTRIL) 20 MG tablet Take 10 mg by mouth daily.    Historical Provider, MD  magnesium oxide (MAG-OX) 400 (241.3 MG) MG tablet Take 1 tablet (400 mg total) by mouth 2 (two) times daily. 09/03/14   Reyne Dumas, MD  Multiple Vitamin (MULTIVITAMIN WITH MINERALS) TABS tablet Take 1 tablet by mouth daily. 07/31/14   Geradine Girt, DO   oxyCODONE (OXY IR/ROXICODONE) 5 MG immediate release tablet Take 5 mg by mouth every 8 (eight) hours. 09/24/14   Historical Provider, MD  pantoprazole (PROTONIX) 40 MG tablet Take 40 mg by mouth daily.  07/09/14   Historical Provider, MD  propranolol (INDERAL) 20 MG tablet Take 20 mg by mouth 2 (two) times daily. 09/24/14   Historical Provider, MD  spironolactone (ALDACTONE) 100 MG tablet Take 100 mg by mouth daily. 10/17/14   Historical Provider, MD  spironolactone (ALDACTONE) 25 MG tablet Take 25 mg by mouth 3 (three) times daily.  10/27/14   Historical Provider, MD  spironolactone (ALDACTONE) 50 MG tablet Take 100 mg by mouth daily.    Historical Provider, MD  thiamine (VITAMIN B-1) 100 MG tablet Take 100 mg by mouth daily.    Historical Provider, MD  XIFAXAN 550 MG TABS tablet Take 550 mg by mouth 2 (two)  times daily.  10/29/14   Historical Provider, MD    Scheduled Meds: . sodium chloride   Intravenous Once  . amitriptyline  25 mg Oral QHS  . clonazePAM  0.25 mg Oral BID  . folic acid  1 mg Oral Daily  . furosemide  40 mg Oral Daily  . insulin glargine  45 Units Subcutaneous QHS  . lactulose  30 g Oral TID  . magnesium oxide  400 mg Oral BID  . multivitamin with minerals  1 tablet Oral Daily  . oxyCODONE  5 mg Oral 3 times per day  . pantoprazole (PROTONIX) IV  40 mg Intravenous Q12H  . propranolol  20 mg Oral BID  . rifaximin  550 mg Oral BID  . sodium chloride  3 mL Intravenous Q12H  . sodium chloride  3 mL Intravenous Q12H  . spironolactone  25 mg Oral TID WC   Continuous Infusions:  PRN Meds:.sodium chloride, alum & mag hydroxide-simeth, ondansetron **OR** ondansetron (ZOFRAN) IV, sodium chloride  Allergies as of 11/29/2014  . (No Known Allergies)    Family History  Problem Relation Age of Onset  . Hypotension Mother     History   Social History  . Marital Status: Divorced    Spouse Name: N/A  . Number of Children: N/A  . Years of Education: N/A   Occupational History   . Not on file.   Social History Main Topics  . Smoking status: Current Every Day Smoker -- 1.00 packs/day for 30 years    Types: Cigarettes  . Smokeless tobacco: Never Used  . Alcohol Use: 0.0 oz/week     Comment: 12 pk beer daily  06/2013 - no alcohol since 11/2012  . Drug Use: Yes    Special: Cocaine     Comment: 1 gram weekly  06/2013 - has not had any for a year  . Sexual Activity: Yes    Birth Control/ Protection: None   Other Topics Concern  . Not on file   Social History Narrative    Review of Systems: All negative except as stated above in HPI.  Physical Exam: Vital signs: Filed Vitals:   11/30/14 0632  BP: 95/50  Pulse: 64  Temp: 98.2 F (36.8 C)  Resp: 16   Last BM Date: 11/29/14 General:   Lethargic, oriented X 4, no acute distress Head: atraumatic Eyes: anicteric, pupils equal and reactive ENT: oropharynx clear Neck: supple, nontender Lungs:  Clear throughout to auscultation.   No wheezes, crackles, or rhonchi. No acute distress. Heart:  Regular rate and rhythm; no murmurs, clicks, rubs,  or gallops. Abdomen: distended, nontender, +BS  Rectal:  Deferred Ext: 2+ - 3 + pitting LE edema Neuro: +asterixis  GI:  Lab Results:  Recent Labs  11/29/14 1145 11/30/14 0419  WBC 5.5 6.5  HGB 7.2* 6.7*  HCT 20.8* 19.4*  PLT 98* 84*   BMET  Recent Labs  11/29/14 1145 11/30/14 0419  NA 127* 125*  K 4.9 5.2*  CL 100* 99*  CO2 20* 21*  GLUCOSE 94 100*  BUN 35* 34*  CREATININE 1.40* 1.48*  CALCIUM 7.8* 7.3*   LFT  Recent Labs  11/29/14 1145  PROT 5.5*  ALBUMIN 2.3*  AST 114*  ALT 74*  ALKPHOS 105  BILITOT 3.3*   PT/INR  Recent Labs  11/29/14 1145  LABPROT 18.9*  INR 1.58*     Studies/Results: Dg Chest 2 View  11/29/2014   CLINICAL DATA:  Lower extremity edema, abdominal distention  x1 week  EXAM: CHEST - 2 VIEW  COMPARISON:  11/10/2014  FINDINGS: Improved aeration. Lungs are clear. Heart size normal. No pneumothorax. No effusion.  Visualized skeletal structures are unremarkable.  IMPRESSION: No acute cardiopulmonary disease.   Electronically Signed   By: Lucrezia Europe M.D.   On: 11/29/2014 12:50   US Paracentesis  11/29/2014   CLINICAL DATA:  Abdominal distention, ascites. Request diagnostic and therapeutic paracentesis of up to 5 L max  EXAM: ULTRASOUND GUIDED PARACENTESIS  COMPARISON:  Previous paracentesis  PROCEDURE: An ultrasound guided paracentesis was thoroughly discussed with the patient and questions answered. The benefits, risks, alternatives and complications were also discussed. The patient understands and wishes to proceed with the procedure. Written consent was obtained.  Ultrasound was performed to localize and mark an adequate pocket of fluid in the left lower quadrant of the abdomen. The area was then prepped and draped in the normal sterile fashion. 1% Lidocaine was used for local anesthesia. Under ultrasound guidance a Safe-T-Centesis catheter was introduced. Paracentesis was performed. The catheter was removed and a dressing applied.  COMPLICATIONS: None immediate  FINDINGS: A total of approximately 5 L of . Clear yellow. fluid was removed. A fluid sample was sent for laboratory analysis.  IMPRESSION: Successful ultrasound guided paracentesis yielding 5 L of ascites.  Read by: Ascencion Dike PA-C   Electronically Signed   By: Marybelle Killings M.D.   On: 11/29/2014 14:07    Impression/Plan: 59 yo with intermittent black stools and anemia in the setting of decompensated alcoholic cirrhosis with recurrent ascites and hepatic encephalopathy. EGD needed to look for source of his anemia. Doubt variceal bleed. Supportive care. May need Octreotide pending EGD's results. Continue IV PPI Q 12 hours. NPO.   LOS: 1 day   [place] C.  11/30/2014, 8:39 AM  Pager [phone]  If no answer or after 5 PM call [phone]

## 2014-12-01 ENCOUNTER — Encounter (HOSPITAL_COMMUNITY): Payer: Self-pay | Admitting: Gastroenterology

## 2014-12-01 DIAGNOSIS — I5032 Chronic diastolic (congestive) heart failure: Secondary | ICD-10-CM

## 2014-12-01 DIAGNOSIS — Z720 Tobacco use: Secondary | ICD-10-CM

## 2014-12-01 LAB — GRAM STAIN

## 2014-12-01 LAB — BASIC METABOLIC PANEL
Anion gap: 6 (ref 5–15)
BUN: 32 mg/dL — ABNORMAL HIGH (ref 6–20)
CO2: 19 mmol/L — AB (ref 22–32)
Calcium: 7.3 mg/dL — ABNORMAL LOW (ref 8.9–10.3)
Chloride: 103 mmol/L (ref 101–111)
Creatinine, Ser: 1.42 mg/dL — ABNORMAL HIGH (ref 0.61–1.24)
GFR calc non Af Amer: 53 mL/min — ABNORMAL LOW (ref >60)
GLUCOSE: 106 mg/dL — AB (ref 65–99)
Potassium: 5.2 mmol/L — ABNORMAL HIGH (ref 3.5–5.1)
Sodium: 128 mmol/L — ABNORMAL LOW (ref 135–145)

## 2014-12-01 LAB — CBC
HEMATOCRIT: 24.9 % — AB (ref 39.0–52.0)
Hemoglobin: 8.4 g/dL — ABNORMAL LOW (ref 13.0–17.0)
MCH: 31.9 pg (ref 26.0–34.0)
MCHC: 33.7 g/dL (ref 30.0–36.0)
MCV: 94.7 fL (ref 78.0–100.0)
PLATELETS: 77 10*3/uL — AB (ref 150–400)
RBC: 2.63 MIL/uL — AB (ref 4.22–5.81)
RDW: 16.6 % — AB (ref 11.5–15.5)
WBC: 5.1 10*3/uL (ref 4.0–10.5)

## 2014-12-01 LAB — TYPE AND SCREEN
ABO/RH(D): O POS
Antibody Screen: NEGATIVE
Unit division: 0
Unit division: 0

## 2014-12-01 LAB — GLUCOSE, CAPILLARY
GLUCOSE-CAPILLARY: 91 mg/dL (ref 65–99)
Glucose-Capillary: 79 mg/dL (ref 65–99)
Glucose-Capillary: 114 mg/dL — ABNORMAL HIGH (ref 65–99)
Glucose-Capillary: 130 mg/dL — ABNORMAL HIGH (ref 65–99)
Glucose-Capillary: 158 mg/dL — ABNORMAL HIGH (ref 65–99)

## 2014-12-01 LAB — OCCULT BLOOD, POC DEVICE: Fecal Occult Bld: POSITIVE — AB

## 2014-12-01 LAB — PATHOLOGIST SMEAR REVIEW

## 2014-12-01 MED ORDER — MORPHINE SULFATE 2 MG/ML IJ SOLN
2.0000 mg | INTRAMUSCULAR | Status: DC | PRN
  Administered 2014-12-01: 2 mg via INTRAVENOUS
  Filled 2014-12-01: qty 1

## 2014-12-01 MED ORDER — FUROSEMIDE 10 MG/ML IJ SOLN
20.0000 mg | Freq: Once | INTRAMUSCULAR | Status: AC
Start: 2014-12-01 — End: 2014-12-01
  Administered 2014-12-01: 20 mg via INTRAVENOUS
  Filled 2014-12-01: qty 2

## 2014-12-01 MED ORDER — ALBUTEROL SULFATE (2.5 MG/3ML) 0.083% IN NEBU
2.5000 mg | INHALATION_SOLUTION | Freq: Four times a day (QID) | RESPIRATORY_TRACT | Status: DC | PRN
  Administered 2014-12-01: 2.5 mg via RESPIRATORY_TRACT
  Filled 2014-12-01: qty 3

## 2014-12-01 MED ORDER — SODIUM CHLORIDE 0.9 % IJ SOLN
10.0000 mL | INTRAMUSCULAR | Status: DC | PRN
  Administered 2014-12-03: 10 mL
  Filled 2014-12-01: qty 40

## 2014-12-01 MED ORDER — OXYCODONE HCL ER 10 MG PO T12A
10.0000 mg | EXTENDED_RELEASE_TABLET | Freq: Two times a day (BID) | ORAL | Status: DC
  Administered 2014-12-01 – 2014-12-04 (×5): 10 mg via ORAL
  Filled 2014-12-01 (×5): qty 1

## 2014-12-01 NOTE — Care Management (Signed)
Important Message  Patient Details  Name: [name] MRN: [id number] Date of Birth: 1955-07-01   Medicare Important Message Given:  Yes-second notification given    Nathen May 12/01/2014, 3:50 PM

## 2014-12-01 NOTE — Progress Notes (Signed)
Peripherally Inserted Central Catheter/Midline Placement  The IV Nurse has discussed with the patient and/or persons authorized to consent for the patient, the purpose of this procedure and the potential benefits and risks involved with this procedure.  The benefits include less needle sticks, lab draws from the catheter and patient may be discharged home with the catheter.  Risks include, but not limited to, infection, bleeding, blood clot (thrombus formation), and puncture of an artery; nerve damage and irregular heat beat.  Alternatives to this procedure were also discussed.  PICC/Midline Placement Documentation  PICC / Midline Double Lumen 12/01/14 PICC Right Brachial 41 cm 1 cm (Active)  Indication for Insertion or Continuance of Line Poor Vasculature-patient has had multiple peripheral attempts or PIVs lasting less than 24 hours 12/01/2014 11:00 AM  Exposed Catheter (cm) 1 cm 12/01/2014 11:00 AM  Dressing Change Due 12/08/14 12/01/2014 11:00 AM       Jule Economy Horton 12/01/2014, 11:24 AM

## 2014-12-01 NOTE — Progress Notes (Signed)
PROGRESS NOTE  [name] QBH:[id number] DOB: [birth year] DOA: 11/29/2014 PCP: Barbette Merino, MD  HPI/Recap of past 15 hours: 59 year old male with past medical history diastolic heart failure, diabetes mellitus and cirrhosis secondary to ongoing alcohol abuse admitted on 7/94 abdominal distention. Patient underwent paracentesis removing 5 L of fluid, but was kept in the hospital after he related black tarry bowel movements for the past 24 hours and a hemoglobin of 7.2, down from 8.5 almost 3 weeks ago.  Likely felt to have GI bleed, gastrology consulted. Hemoglobin down to 6.7 this morning and patient underwent endoscopy 7/10. Endoscopy noted moderate portal hypertrophy gastropathy with bleeding that spontaneous resolved and esophageal varices that were nonbleeding. Morning of procedure prior to endoscopy, patient noted to be much more somnolent. Ammonia level ordered and elevated at 176.Marland Kitchen Patient somewhat hypotensive postprocedure and started on more aggressive IV fluids and transferred to step down unit, especially in lieu of recommendation by GI for continuous octreotide drip.   This morning, patient more awake. Feeling better. Hungry. Blood pressure stable. Hemoglobin and hyponatremia much improved  Assessment/Plan: Principal Problem:   GI bleed secondary to esophageal varices/portal gastropathy: Continue octreotide drip, PPI. Placed in stepdown unit. Hemoglobin up to 8.4 following transfusion. DC octreotide drip as per GI Active Problems:    Tobacco abuse: Nicotine patch   Acute encephalopathy: Secondary to elevated ammonia. Repeat level at 176. Patient more awake now. Continue lactulose. Recheck levels tomorrow    Alcoholic cirrhosis of liver with ascites and secondary thrombocytopenia: Monitoring closely.Cipro given for SBP prophylaxis    Acute blood loss anemia causing acute kidney injury: Continue to monitor. Transfuse 1 unit packed red blood cells.  Much improved.   Chronic diastolic  heart failure: Stable for now. Need to monitor given aggressive fluid resuscitation   Code Status: Full code  Family Communication: Left message for family  Disposition Plan: Continue step down until off of octreotide drip. Potential discharge in next 1-2 days   Consultants:  GI-schooler  Procedures:  Status post EGD on 7/10: Noting hypertensive portal gastropathy, esophageal varices  Status post paracentesis done 7/9:5 L removed  Status post 1 unit packed red blood cells given 7/10  Antibiotics:  IV Cipro 7/10-7/12   Objective: BP 102/63 mmHg  Pulse 51  Temp(Src) 97.5 F (36.4 C) (Oral)  Resp 12  Ht 5\' 11"  (1.803 m)  Wt 99.837 kg (220 lb 1.6 oz)  BMI 30.71 kg/m2  SpO2 100%  Intake/Output Summary (Last 24 hours) at 12/01/14 1140 Last data filed at 12/01/14 1100  Gross per 24 hour  Intake 798.96 ml  Output      0 ml  Net 798.96 ml   Filed Weights   11/29/14 1240 11/29/14 2000 11/30/14 1434  Weight: 105.643 kg (232 lb 14.4 oz) 100.517 kg (221 lb 9.6 oz) 99.837 kg (220 lb 1.6 oz)    Exam:   General:  Alert and oriented, no acute distress  Cardiovascular: Regular rate and rhythm, O9-B3, 2/6 systolic ejection murmur  Respiratory: Clear to auscultation bilaterally  Abdomen: Soft, mild distention, nontender?, Normoactive bowel sounds  Musculoskeletal: No clubbing or cyanosis, trace edema   Data Reviewed: Basic Metabolic Panel:  Recent Labs Lab 11/29/14 1145 11/30/14 0419 12/01/14 0352  NA 127* 125* 128*  K 4.9 5.2* 5.2*  CL 100* 99* 103  CO2 20* 21* 19*  GLUCOSE 94 100* 106*  BUN 35* 34* 32*  CREATININE 1.40* 1.48* 1.42*  CALCIUM 7.8* 7.3* 7.3*   Liver Function Tests:  Recent Labs Lab 11/29/14 1145  AST 114*  ALT 74*  ALKPHOS 105  BILITOT 3.3*  PROT 5.5*  ALBUMIN 2.3*   No results for input(s): LIPASE, AMYLASE in the last 168 hours.  Recent Labs Lab 11/29/14 1145 11/30/14 1215  AMMONIA 47* 176*   CBC:  Recent Labs Lab  11/29/14 1145 11/30/14 0419 12/01/14 0352  WBC 5.5 6.5 5.1  NEUTROABS 4.1  --   --   HGB 7.2* 6.7* 8.4*  HCT 20.8* 19.4* 24.9*  MCV 95.0 93.7 94.7  PLT 98* 84* 77*   Cardiac Enzymes:   No results for input(s): CKTOTAL, CKMB, CKMBINDEX, TROPONINI in the last 168 hours. BNP (last 3 results)  Recent Labs  08/14/14 1910 11/29/14 1145  BNP 29.3 29.4    ProBNP (last 3 results)  Recent Labs  02/08/14 1830 02/10/14 1547  PROBNP 966.3* 805.5*    CBG:  Recent Labs Lab 11/29/14 1422 11/30/14 2008 11/30/14 2322 12/01/14 0453 12/01/14 0945  GLUCAP 100* 94 95 91 79    Recent Results (from the past 240 hour(s))  Culture, body fluid-bottle     Status: None (Preliminary result)   Collection Time: 11/29/14  2:09 PM  Result Value Ref Range Status   Specimen Description PERITONEAL  Final   Special Requests NONE  Final   Culture NO GROWTH 1 DAY  Final   Report Status PENDING  Incomplete  Gram stain     Status: None (Preliminary result)   Collection Time: 11/29/14  2:09 PM  Result Value Ref Range Status   Specimen Description PERITONEAL  Final   Special Requests NONE  Final   Gram Stain RARE POLYMORPHONUCLEAR NO ORGANISMS SEEN   Final   Report Status PENDING  Incomplete     Studies: No results found.  Scheduled Meds: . sodium chloride   Intravenous Once  . amitriptyline  25 mg Oral QHS  . ciprofloxacin  400 mg Intravenous 3 times per day  . folic acid  1 mg Oral Daily  . furosemide  40 mg Oral Daily  . insulin glargine  45 Units Subcutaneous QHS  . lactulose  30 g Oral TID  . magnesium oxide  400 mg Oral BID  . multivitamin with minerals  1 tablet Oral Daily  . oxyCODONE  5 mg Oral 3 times per day  . pantoprazole (PROTONIX) IV  40 mg Intravenous Q12H  . propranolol  20 mg Oral BID  . rifaximin  550 mg Oral BID  . sodium chloride  3 mL Intravenous Q12H  . sodium chloride  3 mL Intravenous Q12H  . spironolactone  50 mg Oral Daily    Continuous Infusions: .  octreotide  (SANDOSTATIN)    IV infusion 25 mcg/hr (12/01/14 0020)     Time spent: 25 min  [facility] Pager [phone]. If 7PM-7AM, please contact night-coverage at [email or web address], password [facility] 12/01/2014, 11:40 AM  LOS: 2 days

## 2014-12-01 NOTE — Progress Notes (Signed)
[facility] Progress Note  Subjective: 1 small dark stool this morning. Feels much better after 5 L paracentesis  Objective: Vital signs in last 24 hours: Temp:  [97.4 F (36.3 C)-98.4 F (36.9 C)] 98.2 F (36.8 C) (07/11 1229) Pulse Rate:  [51-80] 51 (07/11 1100) Resp:  [8-18] 12 (07/11 1100) BP: (94-142)/(52-88) 102/63 mmHg (07/11 1100) SpO2:  [95 %-100 %] 100 % (07/11 1100) Weight:  [99.837 kg (220 lb 1.6 oz)] 99.837 kg (220 lb 1.6 oz) (07/10 1434) Weight change: -2.223 kg (-4 lb 14.4 oz)   PE: Alert oriented abdomen moderately distended 3+ pitting edema in both lower extremities  Lab Results: Results for orders placed or performed during the hospital encounter of 11/29/14 (from the past 24 hour(s))  Glucose, capillary     Status: None   Collection Time: 11/30/14  8:08 PM  Result Value Ref Range   Glucose-Capillary 94 65 - 99 mg/dL  Glucose, capillary     Status: None   Collection Time: 11/30/14 11:22 PM  Result Value Ref Range   Glucose-Capillary 95 65 - 99 mg/dL  CBC     Status: Abnormal   Collection Time: 12/01/14  3:52 AM  Result Value Ref Range   WBC 5.1 4.0 - 10.5 K/uL   RBC 2.63 (L) 4.22 - 5.81 MIL/uL   Hemoglobin 8.4 (L) 13.0 - 17.0 g/dL   HCT 24.9 (L) 39.0 - 52.0 %   MCV 94.7 78.0 - 100.0 fL   MCH 31.9 26.0 - 34.0 pg   MCHC 33.7 30.0 - 36.0 g/dL   RDW 16.6 (H) 11.5 - 15.5 %   Platelets 77 (L) 150 - 400 K/uL  Basic metabolic panel     Status: Abnormal   Collection Time: 12/01/14  3:52 AM  Result Value Ref Range   Sodium 128 (L) 135 - 145 mmol/L   Potassium 5.2 (H) 3.5 - 5.1 mmol/L   Chloride 103 101 - 111 mmol/L   CO2 19 (L) 22 - 32 mmol/L   Glucose, Bld 106 (H) 65 - 99 mg/dL   BUN 32 (H) 6 - 20 mg/dL   Creatinine, Ser 1.42 (H) 0.61 - 1.24 mg/dL   Calcium 7.3 (L) 8.9 - 10.3 mg/dL   GFR calc non Af Amer 53 (L) >60 mL/min   GFR calc Af Amer >60 >60 mL/min   Anion gap 6 5 - 15  Glucose, capillary     Status: None   Collection Time: 12/01/14   4:53 AM  Result Value Ref Range   Glucose-Capillary 91 65 - 99 mg/dL  Glucose, capillary     Status: None   Collection Time: 12/01/14  9:45 AM  Result Value Ref Range   Glucose-Capillary 79 65 - 99 mg/dL    Studies/Results: US Paracentesis  11/29/2014   CLINICAL DATA:  Abdominal distention, ascites. Request diagnostic and therapeutic paracentesis of up to 5 L max  EXAM: ULTRASOUND GUIDED PARACENTESIS  COMPARISON:  Previous paracentesis  PROCEDURE: An ultrasound guided paracentesis was thoroughly discussed with the patient and questions answered. The benefits, risks, alternatives and complications were also discussed. The patient understands and wishes to proceed with the procedure. Written consent was obtained.  Ultrasound was performed to localize and mark an adequate pocket of fluid in the left lower quadrant of the abdomen. The area was then prepped and draped in the normal sterile fashion. 1% Lidocaine was used for local anesthesia. Under ultrasound guidance a Safe-T-Centesis catheter was introduced. Paracentesis was performed. The catheter was  removed and a dressing applied.  COMPLICATIONS: None immediate  FINDINGS: A total of approximately 5 L of . Clear yellow. fluid was removed. A fluid sample was sent for laboratory analysis.  IMPRESSION: Successful ultrasound guided paracentesis yielding 5 L of ascites.  Read by: Ascencion Dike PA-C   Electronically Signed   By: Marybelle Killings M.D.   On: 11/29/2014 14:07      Assessment: Alcoholic cirrhosis with ascites, WBC count negative for SBP.  subacute GI bleeding exact source unclear with portal gastropathy seen on EGD yesterday.  Plan: Continue to monitor stools and hemoglobin Continue PPI Alcohol abstinence if possible Advance diet as tolerated Probably could increase Spirionolactone to 100 mg a day unless he has demonstrated hyperkalemia at higher doses Continue propranolol Can probably discontinue anti-biotics We'll sign off for now.  Please call if further GI input needed.     Ghali Morissette C 12/01/2014, 1:03 PM  Pager [phone] If no answer or after 5 PM call [phone]

## 2014-12-02 DIAGNOSIS — D696 Thrombocytopenia, unspecified: Secondary | ICD-10-CM

## 2014-12-02 LAB — CBC
HCT: 22.3 % — ABNORMAL LOW (ref 39.0–52.0)
HEMOGLOBIN: 7.6 g/dL — AB (ref 13.0–17.0)
MCH: 32.1 pg (ref 26.0–34.0)
MCHC: 34.1 g/dL (ref 30.0–36.0)
MCV: 94.1 fL (ref 78.0–100.0)
Platelets: 87 10*3/uL — ABNORMAL LOW (ref 150–400)
RBC: 2.37 MIL/uL — ABNORMAL LOW (ref 4.22–5.81)
RDW: 16.5 % — ABNORMAL HIGH (ref 11.5–15.5)
WBC: 5.3 10*3/uL (ref 4.0–10.5)

## 2014-12-02 LAB — HEMOGLOBIN AND HEMATOCRIT, BLOOD
HCT: 22.7 % — ABNORMAL LOW (ref 39.0–52.0)
Hemoglobin: 7.6 g/dL — ABNORMAL LOW (ref 13.0–17.0)

## 2014-12-02 LAB — BASIC METABOLIC PANEL
Anion gap: 6 (ref 5–15)
BUN: 33 mg/dL — ABNORMAL HIGH (ref 6–20)
CO2: 21 mmol/L — AB (ref 22–32)
Calcium: 7.5 mg/dL — ABNORMAL LOW (ref 8.9–10.3)
Chloride: 102 mmol/L (ref 101–111)
Creatinine, Ser: 1.38 mg/dL — ABNORMAL HIGH (ref 0.61–1.24)
GFR calc Af Amer: >60 mL/min (ref >60)
GFR calc non Af Amer: 55 mL/min — ABNORMAL LOW (ref >60)
Glucose, Bld: 141 mg/dL — ABNORMAL HIGH (ref 65–99)
POTASSIUM: 5.1 mmol/L (ref 3.5–5.1)
Sodium: 129 mmol/L — ABNORMAL LOW (ref 135–145)

## 2014-12-02 LAB — GLUCOSE, CAPILLARY
GLUCOSE-CAPILLARY: 136 mg/dL — AB (ref 65–99)
GLUCOSE-CAPILLARY: 163 mg/dL — AB (ref 65–99)
Glucose-Capillary: 199 mg/dL — ABNORMAL HIGH (ref 65–99)
Glucose-Capillary: 166 mg/dL — ABNORMAL HIGH (ref 65–99)

## 2014-12-02 LAB — AMMONIA: Ammonia: 106 umol/L — ABNORMAL HIGH (ref 9–35)

## 2014-12-02 MED ORDER — FUROSEMIDE 10 MG/ML IJ SOLN
40.0000 mg | Freq: Two times a day (BID) | INTRAMUSCULAR | Status: DC
  Administered 2014-12-02 – 2014-12-04 (×5): 40 mg via INTRAVENOUS
  Filled 2014-12-02 (×8): qty 4

## 2014-12-02 NOTE — Progress Notes (Signed)
PROGRESS NOTE  [name] LZJ:[id number] DOB: 1955/06/06 DOA: 11/29/2014 PCP: Barbette Merino, MD  HPI/Recap of past 73 hours: 59 year old male with past medical history diastolic heart failure, diabetes mellitus and cirrhosis secondary to ongoing alcohol abuse admitted on 7/94 abdominal distention. Patient underwent paracentesis removing 5 L of fluid, but was kept in the hospital after he related black tarry bowel movements for the past 24 hours and a hemoglobin of 7.2, down from 8.5 almost 3 weeks ago.  Likely felt to have GI bleed, gastrology consulted. Hemoglobin down to 6.7 this morning and patient underwent endoscopy 7/10. Endoscopy noted moderate portal hypertrophy gastropathy with bleeding that spontaneous resolved and esophageal varices that were nonbleeding. Morning of procedure prior to endoscopy, patient noted to be much more somnolent. Ammonia level ordered and elevated at 176.Marland Kitchen Patient somewhat hypotensive postprocedure and started on more aggressive IV fluids and transferred to step down unit, especially in lieu of recommendation by GI for continuous octreotide drip.   Patient tolerating by mouth. Blood pressure has been slightly soft and hemoglobin dropped from 8.4 7.6, although BUN and creatinine improved.  Continues to stay alert. Assessment/Plan: Principal Problem:   GI bleed secondary to esophageal varices/portal gastropathy: Continue octreotide drip, PPI. Placed in stepdown unit. Transfused 1 unit. Octreotide drip-we'll discontinue. Recheck evening hemoglobin and if further down, will notify GI. Otherwise continue in stepdown for now Active Problems:    Tobacco abuse: Nicotine patch   Acute encephalopathy: Secondary to elevated ammonia. Trending back down. Patient more awake now. Continue lactulose.     Alcoholic cirrhosis of liver with ascites and secondary thrombocytopenia: Monitoring closely. no evidence of SBP prophylaxis so we'll discontinue antibiotics. Unfortunately with  poor albumen stores and fluids, continues to have problems with lower extremity swelling, third spacing and ascites    Acute blood loss anemia causing acute kidney injury: Continue to monitor. Transfused 1 unit packed red blood cells.  Much improved.   Chronic diastolic heart failure: Stable for now. Need to monitor given aggressive fluid resuscitation   Code Status: Full code  Family Communication: Left message for family  Disposition Plan: Out of bed to chair. Continue in stepdown. PT to evaluate. Expect discharge in next 24-48 hours if no further bleeding   Consultants:  GI-schooler  Procedures:  Status post EGD on 7/10: Noting hypertensive portal gastropathy, esophageal varices  Status post paracentesis done 7/9:5 L removed  Status post 1 unit packed red blood cells given 7/10  Antibiotics:  IV Cipro 7/10-7/12   Objective: BP 126/77 mmHg  Pulse 56  Temp(Src) 98.2 F (36.8 C) (Oral)  Resp 11  Ht 5\' 11"  (1.803 m)  Wt 99.837 kg (220 lb 1.6 oz)  BMI 30.71 kg/m2  SpO2 100%  Intake/Output Summary (Last 24 hours) at 12/02/14 1317 Last data filed at 12/02/14 1300  Gross per 24 hour  Intake 1597.48 ml  Output      0 ml  Net 1597.48 ml   Filed Weights   11/29/14 1240 11/29/14 2000 11/30/14 1434  Weight: 105.643 kg (232 lb 14.4 oz) 100.517 kg (221 lb 9.6 oz) 99.837 kg (220 lb 1.6 oz)    Exam:   General:  Alert and oriented, no acute distress  Cardiovascular: Regular rate and rhythm, K2-I0, 2/6 systolic ejection murmur  Respiratory: Clear to auscultation bilaterally  Abdomen: Soft, mild distention, nontender?, Normoactive bowel sounds  Musculoskeletal: No clubbing or cyanosis, 3+ pitting edema bilaterally, third spacing  Data Reviewed: Basic Metabolic Panel:  Recent Labs Lab 11/29/14  1145 11/30/14 0419 12/01/14 0352 12/02/14 0500  NA 127* 125* 128* 129*  K 4.9 5.2* 5.2* 5.1  CL 100* 99* 103 102  CO2 20* 21* 19* 21*  GLUCOSE 94 100* 106* 141*    BUN 35* 34* 32* 33*  CREATININE 1.40* 1.48* 1.42* 1.38*  CALCIUM 7.8* 7.3* 7.3* 7.5*   Liver Function Tests:  Recent Labs Lab 11/29/14 1145  AST 114*  ALT 74*  ALKPHOS 105  BILITOT 3.3*  PROT 5.5*  ALBUMIN 2.3*   No results for input(s): LIPASE, AMYLASE in the last 168 hours.  Recent Labs Lab 11/29/14 1145 11/30/14 1215 12/02/14 0556  AMMONIA 47* 176* 106*   CBC:  Recent Labs Lab 11/29/14 1145 11/30/14 0419 12/01/14 0352 12/02/14 0500  WBC 5.5 6.5 5.1 5.3  NEUTROABS 4.1  --   --   --   HGB 7.2* 6.7* 8.4* 7.6*  HCT 20.8* 19.4* 24.9* 22.3*  MCV 95.0 93.7 94.7 94.1  PLT 98* 84* 77* 87*   Cardiac Enzymes:   No results for input(s): CKTOTAL, CKMB, CKMBINDEX, TROPONINI in the last 168 hours. BNP (last 3 results)  Recent Labs  08/14/14 1910 11/29/14 1145  BNP 29.3 29.4    ProBNP (last 3 results)  Recent Labs  02/08/14 1830 02/10/14 1547  PROBNP 966.3* 805.5*    CBG:  Recent Labs Lab 12/01/14 1315 12/01/14 1641 12/01/14 2146 12/02/14 0831 12/02/14 1249  GLUCAP 114* 130* 158* 136* 163*    Recent Results (from the past 240 hour(s))  Culture, body fluid-bottle     Status: None (Preliminary result)   Collection Time: 11/29/14  2:09 PM  Result Value Ref Range Status   Specimen Description PERITONEAL  Final   Special Requests NONE  Final   Culture NO GROWTH 2 DAYS  Final   Report Status PENDING  Incomplete  Gram stain     Status: None   Collection Time: 11/29/14  2:09 PM  Result Value Ref Range Status   Specimen Description PERITONEAL  Final   Special Requests NONE  Final   Gram Stain RARE POLYMORPHONUCLEAR NO ORGANISMS SEEN   Final   Report Status 12/01/2014 FINAL  Final     Studies: No results found.  Scheduled Meds: . amitriptyline  25 mg Oral QHS  . folic acid  1 mg Oral Daily  . furosemide  40 mg Intravenous BID  . insulin glargine  45 Units Subcutaneous QHS  . lactulose  30 g Oral TID  . magnesium oxide  400 mg Oral BID  .  multivitamin with minerals  1 tablet Oral Daily  . OxyCODONE  10 mg Oral Q12H  . pantoprazole (PROTONIX) IV  40 mg Intravenous Q12H  . propranolol  20 mg Oral BID  . rifaximin  550 mg Oral BID  . sodium chloride  3 mL Intravenous Q12H  . sodium chloride  3 mL Intravenous Q12H  . spironolactone  50 mg Oral Daily    Continuous Infusions:     Time spent: 25 min  [facility] Pager [phone]. If 7PM-7AM, please contact night-coverage at [email or web address], password [facility] 12/02/2014, 1:17 PM  LOS: 3 days

## 2014-12-03 DIAGNOSIS — N183 Chronic kidney disease, stage 3 (moderate): Secondary | ICD-10-CM

## 2014-12-03 LAB — CBC
HEMATOCRIT: 23.1 % — AB (ref 39.0–52.0)
HEMOGLOBIN: 7.7 g/dL — AB (ref 13.0–17.0)
MCH: 31.8 pg (ref 26.0–34.0)
MCHC: 33.3 g/dL (ref 30.0–36.0)
MCV: 95.5 fL (ref 78.0–100.0)
PLATELETS: 95 10*3/uL — AB (ref 150–400)
RBC: 2.42 MIL/uL — ABNORMAL LOW (ref 4.22–5.81)
RDW: 16.4 % — AB (ref 11.5–15.5)
WBC: 5.7 10*3/uL (ref 4.0–10.5)

## 2014-12-03 LAB — BASIC METABOLIC PANEL
Anion gap: 5 (ref 5–15)
BUN: 34 mg/dL — AB (ref 6–20)
CO2: 22 mmol/L (ref 22–32)
Calcium: 7.6 mg/dL — ABNORMAL LOW (ref 8.9–10.3)
Chloride: 103 mmol/L (ref 101–111)
Creatinine, Ser: 1.47 mg/dL — ABNORMAL HIGH (ref 0.61–1.24)
GFR calc Af Amer: 59 mL/min — ABNORMAL LOW (ref >60)
GFR, EST NON AFRICAN AMERICAN: 51 mL/min — AB (ref >60)
GLUCOSE: 144 mg/dL — AB (ref 65–99)
POTASSIUM: 5.1 mmol/L (ref 3.5–5.1)
Sodium: 130 mmol/L — ABNORMAL LOW (ref 135–145)

## 2014-12-03 LAB — GLUCOSE, CAPILLARY
Glucose-Capillary: 140 mg/dL — ABNORMAL HIGH (ref 65–99)
Glucose-Capillary: 147 mg/dL — ABNORMAL HIGH (ref 65–99)
Glucose-Capillary: 113 mg/dL — ABNORMAL HIGH (ref 65–99)
Glucose-Capillary: 166 mg/dL — ABNORMAL HIGH (ref 65–99)

## 2014-12-03 MED ORDER — SPIRONOLACTONE 50 MG PO TABS: 50.0000 mg | ORAL_TABLET | Freq: Every day | ORAL | Status: DC

## 2014-12-03 MED ORDER — PANTOPRAZOLE SODIUM 40 MG PO TBEC: 40.0000 mg | DELAYED_RELEASE_TABLET | Freq: Every day | ORAL | Status: DC

## 2014-12-03 MED ORDER — LACTULOSE 10 GM/15ML PO SOLN: 30.0000 g | Freq: Three times a day (TID) | ORAL | Status: DC

## 2014-12-03 NOTE — Progress Notes (Signed)
Pt remains a high fall risk, needing cues to do simplest of tasks such as eating. RN phoned Constance Holster (friend) at 6pm. She stated that the couple who were to move in with pt and provide care are now not available. She states he should go back to [place] where he received care in the past.

## 2014-12-03 NOTE — Care Management Note (Signed)
Case Management Note  Patient Details  Name: BRION SOSSAMON MRN: [id number] Date of Birth: 04/02/1956  Subjective/Objective:    Received referral to arrange home health services for pt - discussed with pt who agrees.  Talked with liaison for two agencies, [place] - neither would accept referral as both have followed pt previously and feel he is unsafe living alone, APS was consulted but pt refused other options in the past.  When this was discussed with pt, he states he is going to call his brother and ask if he can move in with him - refuses to consider ALF placement.              Kynleigh Artz, [place] T, RN 12/03/2014, 1:51 PM

## 2014-12-03 NOTE — Discharge Summary (Signed)
Discharge Summary  [name] HKV:[id number] DOB: 20-Oct-1955  PCP: Barbette Merino, MD  Admit date: 11/29/2014 Discharge date: 12/03/2014  Time spent: 25 minutes  Recommendations for Outpatient Follow-up:  1. Medication change: Spironolactone changed to 50 mg by mouth daily 2. Patient will follow-up with his PCP, Dr. Jonelle Sidle, in the next 2 weeks 3.  patient going home with home health services including RN, PT and aid 4.  Advised patient and girlfriend that he needs to continue to commit to taking his medications, watch his fluid intake and avoid all drugs and alcohol  Discharge Diagnoses:  Active Hospital Problems   Diagnosis Date Noted  . GI bleed 01/14/2014  . Chronic diastolic heart failure 56/43/3295  . Acute blood loss anemia   . Alcoholic cirrhosis of liver with ascites   . Acute kidney injury 09/13/2013  . Acute encephalopathy 08/28/2013  . Tobacco abuse 03/11/2013  . Thrombocytopenia 04/27/2012    Resolved Hospital Problems   Diagnosis Date Noted Date Resolved  No resolved problems to display.    Discharge Condition: Improved, being discharged home. Patient refuses assisted living. Although he lives alone, girlfriend setting up friends who will come and stay with him  Diet recommendation: Low sodium  Filed Weights   11/29/14 1240 11/29/14 2000 11/30/14 1434  Weight: 105.643 kg (232 lb 14.4 oz) 100.517 kg (221 lb 9.6 oz) 99.837 kg (220 lb 1.6 oz)    History of present illness:  59 year old male with past medical history diastolic heart failure, diabetes mellitus and cirrhosis secondary to ongoing alcohol abuse admitted on 7/94 abdominal distention. Patient underwent paracentesis removing 5 L of fluid, but was kept in the hospital after he related black tarry bowel movements for the past 24 hours and a hemoglobin of 7.2, down from 8.5 almost 3 weeks ago.  Hospital Course:  Principal Problem:   Upper GI bleed with acute blood loss anemia: Gastroenterology consulted.  Hemoglobin down to 6.7  and patient underwent endoscopy 7/10. Endoscopy noted moderate portal hypertrophy gastropathy with bleeding that spontaneous resolved and esophageal varices that were nonbleeding. Postprocedure, patient on octreotide drip which was able to be weaned off after several days. Patient's hemoglobin has been stable now for over 48 hours at 7.7.  Active Problems:    Tobacco abuse: Patient advised not to smoke.     Alcoholic cirrhosis of liver with ascites, secondary thrombocytopenia and acute on chronic hepatic encephalopathy: Morning of procedure prior to endoscopy, patient noted to be much more somnolent. Ammonia level ordered and elevated at 176. Restarted on lactulose and by day of discharge, patient much more alert. Ammonia level on 7/12 at 106.  Platelet count on day of discharge at 95. As mentioned above, patient underwent therapeutic paracentesis on admission. Advised patient doing girlfriend he really needs to limit water intake, which girlfriend states he is not compliant with  Chronic kidney disease stage III: Stable    Chronic diastolic heart failure: Stable. Patient has issues with chronic third spacing into the abdominal cavity and chronic 3+ pitting edema which makes trying to get him or system for euvolemic and difficult. Weight is actually down 5 pounds from admission.   Consultants:  GI-schooler  Procedures:  Status post EGD on 7/10: Noting hypertensive portal gastropathy, esophageal varices  Status post paracentesis done 7/9:5 L removed  Status post 1 unit packed red blood cells given 7/10  Discharge Exam: BP 96/61 mmHg  Pulse 57  Temp(Src) 98.4 F (36.9 C) (Oral)  Resp 12  Ht 5\' 11"  (  1.803 m)  Wt 99.837 kg (220 lb 1.6 oz)  BMI 30.71 kg/m2  SpO2 100%  General: Alert and oriented  3, no acute distress Cardiovascular: Regular rate and rhythm, X3-A3, 2/6 systolic ejection murmur Respiratory: Clear to auscultation bilaterally  Discharge  Instructions You were cared for by a hospitalist during your hospital stay. If you have any questions about your discharge medications or the care you received while you were in the hospital after you are discharged, you can call the unit and asked to speak with the hospitalist on call if the hospitalist that took care of you is not available. Once you are discharged, your primary care physician will handle any further medical issues. Please note that NO REFILLS for any discharge medications will be authorized once you are discharged, as it is imperative that you return to your primary care physician (or establish a relationship with a primary care physician if you do not have one) for your aftercare needs so that they can reassess your need for medications and monitor your lab values.  Discharge Instructions    Diet - low sodium heart healthy    Complete by:  As directed      Increase activity slowly    Complete by:  As directed             Medication List    STOP taking these medications        diazepam 5 MG tablet  Commonly known as:  VALIUM     insulin aspart 100 UNIT/ML FlexPen  Commonly known as:  NOVOLOG FLEXPEN     lisinopril 20 MG tablet  Commonly known as:  PRINIVIL,ZESTRIL      TAKE these medications        amitriptyline 25 MG tablet  Commonly known as:  ELAVIL  Take 1 tablet (25 mg total) by mouth at bedtime.     CALCIUM-VITAMIN D PO  Take 100 Units by mouth daily.     clonazePAM 0.25 MG disintegrating tablet  Commonly known as:  KLONOPIN  Take 0.25 mg by mouth 2 (two) times daily.     diclofenac sodium 1 % Gel  Commonly known as:  VOLTAREN  Apply 4 g topically as needed (for back of leg pain).     feeding supplement (ENSURE ENLIVE) Liqd  Take 237 mLs by mouth 2 (two) times daily between meals.     folic acid 1 MG tablet  Commonly known as:  FOLVITE  Take 1 tablet (1 mg total) by mouth daily.     furosemide 40 MG tablet  Commonly known as:  LASIX  Take  40 mg by mouth daily.     lactulose 10 GM/15ML solution  Commonly known as:  CHRONULAC  Take 45 mLs (30 g total) by mouth 3 (three) times daily.     LANTUS SOLOSTAR 100 UNIT/ML Solostar Pen  Generic drug:  Insulin Glargine  Inject 45 Units into the skin daily at 10 pm.     magnesium oxide 400 (241.3 MG) MG tablet  Commonly known as:  MAG-OX  Take 1 tablet (400 mg total) by mouth 2 (two) times daily.     multivitamin with minerals Tabs tablet  Take 1 tablet by mouth daily.     Oxycodone HCl 10 MG Tabs  Take 10 mg by mouth 3 (three) times daily.     pantoprazole 40 MG tablet  Commonly known as:  PROTONIX  Take 1 tablet (40 mg total) by mouth daily.  propranolol 20 MG tablet  Commonly known as:  INDERAL  Take 20 mg by mouth 2 (two) times daily.     spironolactone 50 MG tablet  Commonly known as:  ALDACTONE  Take 1 tablet (50 mg total) by mouth daily.     thiamine 100 MG tablet  Commonly known as:  VITAMIN B-1  Take 100 mg by mouth daily.     XIFAXAN 550 MG Tabs tablet  Generic drug:  rifaximin  Take 550 mg by mouth 2 (two) times daily.       No Known Allergies    The results of significant diagnostics from this hospitalization (including imaging, microbiology, ancillary and laboratory) are listed below for reference.    Significant Diagnostic Studies: Dg Chest 2 View  11/29/2014   CLINICAL DATA:  Lower extremity edema, abdominal distention x1 week  EXAM: CHEST - 2 VIEW  COMPARISON:  11/10/2014  FINDINGS: Improved aeration. Lungs are clear. Heart size normal. No pneumothorax. No effusion. Visualized skeletal structures are unremarkable.  IMPRESSION: No acute cardiopulmonary disease.   Electronically Signed   By: Lucrezia Europe M.D.   On: 11/29/2014 12:50   Ct Head Wo Contrast  11/10/2014   CLINICAL DATA:  Altered mental status, hyperglycemia, unresponsive, history diabetes mellitus, cirrhosis, hepatic encephalopathy, COPD, prior stroke, hypertension  EXAM: CT HEAD  WITHOUT CONTRAST  TECHNIQUE: Contiguous axial images were obtained from the base of the skull through the vertex without intravenous contrast.  COMPARISON:  08/29/2014  FINDINGS: Mild generalized atrophy.  Normal ventricular morphology.  No midline shift or mass effect.  Otherwise normal appearance of brain parenchyma.  No intracranial hemorrhage, mass lesion, or acute infarction.  Visualized paranasal sinuses clear.  Bones unremarkable.  IMPRESSION: No acute intracranial abnormalities.   Electronically Signed   By: Lavonia Dana M.D.   On: 11/10/2014 13:42   US Paracentesis  11/29/2014   CLINICAL DATA:  Abdominal distention, ascites. Request diagnostic and therapeutic paracentesis of up to 5 L max  EXAM: ULTRASOUND GUIDED PARACENTESIS  COMPARISON:  Previous paracentesis  PROCEDURE: An ultrasound guided paracentesis was thoroughly discussed with the patient and questions answered. The benefits, risks, alternatives and complications were also discussed. The patient understands and wishes to proceed with the procedure. Written consent was obtained.  Ultrasound was performed to localize and mark an adequate pocket of fluid in the left lower quadrant of the abdomen. The area was then prepped and draped in the normal sterile fashion. 1% Lidocaine was used for local anesthesia. Under ultrasound guidance a Safe-T-Centesis catheter was introduced. Paracentesis was performed. The catheter was removed and a dressing applied.  COMPLICATIONS: None immediate  FINDINGS: A total of approximately 5 L of . Clear yellow. fluid was removed. A fluid sample was sent for laboratory analysis.  IMPRESSION: Successful ultrasound guided paracentesis yielding 5 L of ascites.  Read by: Ascencion Dike PA-C   Electronically Signed   By: Marybelle Killings M.D.   On: 11/29/2014 14:07   US Paracentesis  11/11/2014   INDICATION: ascites  EXAM: ULTRASOUND-GUIDED PARACENTESIS  COMPARISON:  Previous paracentesis  MEDICATIONS: 10 cc 1% lidocaine   COMPLICATIONS: None immediate  TECHNIQUE: Informed written consent was obtained from the patient after a discussion of the risks, benefits and alternatives to treatment. A timeout was performed prior to the initiation of the procedure.  Initial ultrasound scanning demonstrates a large amount of ascites within the left lower abdominal quadrant. The left lower abdomen was prepped and draped in the usual sterile fashion. 1%  lidocaine with epinephrine was used for local anesthesia. Under direct ultrasound guidance, a 19 gauge, 7-cm, Yueh catheter was introduced. An ultrasound image was saved for documentation purposed. The paracentesis was performed. The catheter was removed and a dressing was applied. The patient tolerated the procedure well without immediate post procedural complication.  FINDINGS: A total of approximately 5.8 liters of yellow fluid was removed. Samples were sent to the laboratory as requested by the clinical team.  IMPRESSION: Successful ultrasound-guided paracentesis yielding 5.8 liters of peritoneal fluid.  Read by:  Lavonia Drafts [facility]   Electronically Signed   By: Jerilynn Mages.  Shick M.D.   On: 11/11/2014 15:01   Dg Chest Port 1 View  11/10/2014   CLINICAL DATA:  Status post nasogastric catheter placement  EXAM: PORTABLE CHEST - 1 VIEW  COMPARISON:  11/10/2014  FINDINGS: Cardiac shadow is stable. Mild right basilar atelectasis is again identified. The overall inspiratory effort is poor. A nasogastric catheter is noted extending into the stomach. No other focal abnormality is noted.  IMPRESSION: Nasogastric catheter within the stomach.   Electronically Signed   By: Inez Catalina M.D.   On: 11/10/2014 15:51   Dg Chest Port 1 View  11/10/2014   CLINICAL DATA:  Unresponsive, unable to follow commands, abdominal distension, history cirrhosis, diabetes mellitus, hypertension, COPD, hepatic encephalopathy  EXAM: PORTABLE CHEST - 1 VIEW  COMPARISON:  Portable exam 1236 hours compared to 09/18/2014  FINDINGS:  Decreased lung volumes.  Upper normal heart size.  Normal mediastinal contours and pulmonary vascularity.  Minimal RIGHT basilar atelectasis.  Lungs otherwise clear.  No pleural effusion or pneumothorax.  Bones unremarkable.  IMPRESSION: Minimal RIGHT basilar atelectasis.   Electronically Signed   By: Lavonia Dana M.D.   On: 11/10/2014 13:10    Microbiology: Recent Results (from the past 240 hour(s))  Culture, body fluid-bottle     Status: None (Preliminary result)   Collection Time: 11/29/14  2:09 PM  Result Value Ref Range Status   Specimen Description PERITONEAL  Final   Special Requests NONE  Final   Culture NO GROWTH 4 DAYS  Final   Report Status PENDING  Incomplete  Gram stain     Status: None   Collection Time: 11/29/14  2:09 PM  Result Value Ref Range Status   Specimen Description PERITONEAL  Final   Special Requests NONE  Final   Gram Stain RARE POLYMORPHONUCLEAR NO ORGANISMS SEEN   Final   Report Status 12/01/2014 FINAL  Final     Labs: Basic Metabolic Panel:  Recent Labs Lab 11/29/14 1145 11/30/14 0419 12/01/14 0352 12/02/14 0500 12/03/14 0518  NA 127* 125* 128* 129* 130*  K 4.9 5.2* 5.2* 5.1 5.1  CL 100* 99* 103 102 103  CO2 20* 21* 19* 21* 22  GLUCOSE 94 100* 106* 141* 144*  BUN 35* 34* 32* 33* 34*  CREATININE 1.40* 1.48* 1.42* 1.38* 1.47*  CALCIUM 7.8* 7.3* 7.3* 7.5* 7.6*   Liver Function Tests:  Recent Labs Lab 11/29/14 1145  AST 114*  ALT 74*  ALKPHOS 105  BILITOT 3.3*  PROT 5.5*  ALBUMIN 2.3*   No results for input(s): LIPASE, AMYLASE in the last 168 hours.  Recent Labs Lab 11/29/14 1145 11/30/14 1215 12/02/14 0556  AMMONIA 47* 176* 106*   CBC:  Recent Labs Lab 11/29/14 1145 11/30/14 0419 12/01/14 0352 12/02/14 0500 12/02/14 1659 12/03/14 0518  WBC 5.5 6.5 5.1 5.3  --  5.7  NEUTROABS 4.1  --   --   --   --   --  HGB 7.2* 6.7* 8.4* 7.6* 7.6* 7.7*  HCT 20.8* 19.4* 24.9* 22.3* 22.7* 23.1*  MCV 95.0 93.7 94.7 94.1  --  95.5    PLT 98* 84* 77* 87*  --  95*   Cardiac Enzymes: No results for input(s): CKTOTAL, CKMB, CKMBINDEX, TROPONINI in the last 168 hours. BNP: BNP (last 3 results)  Recent Labs  08/14/14 1910 11/29/14 1145  BNP 29.3 29.4    ProBNP (last 3 results)  Recent Labs  02/08/14 1830 02/10/14 1547  PROBNP 966.3* 805.5*    CBG:  Recent Labs Lab 12/02/14 1249 12/02/14 1553 12/02/14 2204 12/03/14 0852 12/03/14 1226  GLUCAP 163* 199* 166* 140* 147*       Signed:  Breck Maryland K  [facility] 12/03/2014, 2:10 PM

## 2014-12-03 NOTE — Evaluation (Signed)
Physical Therapy Evaluation Patient Details Name: [name] MRN: [id number] DOB: 1955/08/21 Today's Date: 12/03/2014   History of Present Illness  59 year old male with past medical history diastolic heart failure, diabetes mellitus and cirrhosis secondary to ongoing alcohol abuse admitted on 7/94 abdominal distention. Patient underwent paracentesis removing 5 L of fluid, but was kept in the hospital after he related black tarry bowel movements for the past 24 hours and a hemoglobin of 7.2, down from 8.5 almost 3 weeks ago. Pt with GIB.  Clinical Impression  Pt admitted with above diagnosis. Pt currently with functional limitations due to the deficits listed below (see PT Problem List). Pt ambulated with cues for safety awareness.  Will need 24 hour assist at home and pt states between his brother, fiance and personal care attendant, he will have 24 hour care.   Pt will benefit from skilled PT to increase their independence and safety with mobility to allow discharge to the venue listed below.      Follow Up Recommendations Home health PT (safety eval)    Equipment Recommendations  None recommended by PT    Recommendations for Other Services       Precautions / Restrictions Precautions Precautions: Fall Restrictions Weight Bearing Restrictions: No      Mobility  Bed Mobility Overal bed mobility: Needs Assistance Bed Mobility: Supine to Sit     Supine to sit: Mod assist     General bed mobility comments: extra time and using bed rails  Transfers Overall transfer level: Needs assistance Equipment used: Rolling walker (2 wheeled) Transfers: Sit to/from Stand Sit to Stand: Supervision;Min guard         General transfer comment: Pt unsafe with use of RW, can benefit from RW safety education.   Ambulation/Gait Ambulation/Gait assistance: Supervision Ambulation Distance (Feet): 250 Feet Assistive device: Rolling walker (2 wheeled) Gait Pattern/deviations: Step-through  pattern;Decreased stride length;Wide base of support   Gait velocity interpretation: <1.8 ft/sec, indicative of risk for recurrent falls General Gait Details: Pt needed a lot of cues for safety.  Pt reported that he needed to use bathroom.  Pt needed cues to stay close to RW and not to leave RW.  Pt had candy in his hand and PT had to remind him for PT to take candy so he could urinate.  Then pt walked to sink to wash his hands.  He put soap on his hands and needed cues to use the water as he was reaching for the paper towels before he rinsed the soap.  Pt then ambulated in hallway needing cues to stay close to RW and for RW safety in general.    Stairs            Wheelchair Mobility    Modified Rankin (Stroke Patients Only)       Balance Overall balance assessment: Needs assistance;History of Falls Sitting-balance support: No upper extremity supported;Feet supported Sitting balance-Leahy Scale: Good     Standing balance support: No upper extremity supported;During functional activity Standing balance-Leahy Scale: Fair Standing balance comment: Pt able to stand statically without UE support with good balance.                               Pertinent Vitals/Pain Pain Assessment: No/denies pain  57-68 bpm, 93/61 pre BP, 104/48 post BP, 99% RA.    Home Living Family/patient expects to be discharged to:: Private residence Living Arrangements: Spouse/significant other Available Help  at Discharge: Family;Personal care attendant (Fiance and brother, per pt report) Type of Home: Apartment Home Access: Level entry     Home Layout: One level Home Equipment: Walker - 2 wheels;Bedside commode;Tub bench;Hand held shower head Additional Comments: patient states that he lives by himself and that he has no steps to get in, he can just walk through the laundry room    Prior Function Level of Independence: Independent with assistive device(s);Needs assistance   Gait /  Transfers Assistance Needed: Uses RW vs cane (cane for house hold mobility and RW for community mobility)  ADL's / Homemaking Assistance Needed: mod I, uses bench and BSC beside bed)  Comments: Pt states that he has fallen 4 times in last several months but never with RW.       Hand Dominance   Dominant Hand: Right    Extremity/Trunk Assessment   Upper Extremity Assessment: Defer to OT evaluation           Lower Extremity Assessment: Overall WFL for tasks assessed      Cervical / Trunk Assessment: Normal  Communication   Communication: No difficulties  Cognition Arousal/Alertness: Awake/alert Behavior During Therapy: WFL for tasks assessed/performed Overall Cognitive Status: History of cognitive impairments - at baseline Area of Impairment: Safety/judgement;Problem solving;Following commands       Following Commands: Follows one step commands inconsistently Safety/Judgement: Decreased awareness of safety;Decreased awareness of deficits   Problem Solving: Slow processing;Decreased initiation;Difficulty sequencing;Requires verbal cues      General Comments      Exercises        Assessment/Plan    PT Assessment Patient needs continued PT services  PT Diagnosis Generalized weakness   PT Problem List Decreased activity tolerance;Decreased balance;Decreased mobility;Decreased knowledge of use of DME;Decreased safety awareness;Decreased knowledge of precautions;Decreased strength  PT Treatment Interventions DME instruction;Gait training;Therapeutic activities;Functional mobility training;Therapeutic exercise;Balance training;Patient/family education   PT Goals (Current goals can be found in the Care Plan section) Acute Rehab PT Goals Patient Stated Goal: go home today PT Goal Formulation: With patient Time For Goal Achievement: 12/17/14 Potential to Achieve Goals: Good    Frequency Min 3X/week   Barriers to discharge        Co-evaluation                End of Session Equipment Utilized During Treatment: Gait belt Activity Tolerance: Patient tolerated treatment well Patient left: in chair;with call bell/phone within reach Nurse Communication: Mobility status         Time: 1043-1106 PT Time Calculation (min) (ACUTE ONLY): 23 min   Charges:   PT Evaluation $Initial PT Evaluation Tier I: 1 Procedure PT Treatments $Gait Training: 8-22 mins   PT G CodesDenice Baker Dec 19, 2014, 12:56 PM [name],PT Acute Rehabilitation [phone] (pager)

## 2014-12-04 LAB — BASIC METABOLIC PANEL
Anion gap: 5 (ref 5–15)
BUN: 36 mg/dL — ABNORMAL HIGH (ref 6–20)
CO2: 24 mmol/L (ref 22–32)
Calcium: 7.5 mg/dL — ABNORMAL LOW (ref 8.9–10.3)
Chloride: 100 mmol/L — ABNORMAL LOW (ref 101–111)
Creatinine, Ser: 1.64 mg/dL — ABNORMAL HIGH (ref 0.61–1.24)
GFR calc Af Amer: 52 mL/min — ABNORMAL LOW (ref >60)
GFR calc non Af Amer: 45 mL/min — ABNORMAL LOW (ref >60)
GLUCOSE: 130 mg/dL — AB (ref 65–99)
POTASSIUM: 5.3 mmol/L — AB (ref 3.5–5.1)
Sodium: 129 mmol/L — ABNORMAL LOW (ref 135–145)

## 2014-12-04 LAB — GLUCOSE, CAPILLARY
GLUCOSE-CAPILLARY: 121 mg/dL — AB (ref 65–99)
Glucose-Capillary: 115 mg/dL — ABNORMAL HIGH (ref 65–99)
Glucose-Capillary: 129 mg/dL — ABNORMAL HIGH (ref 65–99)

## 2014-12-04 LAB — HEMOGLOBIN AND HEMATOCRIT, BLOOD
HCT: 22.6 % — ABNORMAL LOW (ref 39.0–52.0)
Hemoglobin: 7.5 g/dL — ABNORMAL LOW (ref 13.0–17.0)

## 2014-12-04 LAB — CULTURE, BODY FLUID-BOTTLE

## 2014-12-04 LAB — CBC
HCT: 21.3 % — ABNORMAL LOW (ref 39.0–52.0)
HEMOGLOBIN: 7.1 g/dL — AB (ref 13.0–17.0)
MCH: 31.8 pg (ref 26.0–34.0)
MCHC: 33.3 g/dL (ref 30.0–36.0)
MCV: 95.5 fL (ref 78.0–100.0)
Platelets: 71 10*3/uL — ABNORMAL LOW (ref 150–400)
RBC: 2.23 MIL/uL — AB (ref 4.22–5.81)
RDW: 16.5 % — ABNORMAL HIGH (ref 11.5–15.5)
WBC: 5 10*3/uL (ref 4.0–10.5)

## 2014-12-04 LAB — CULTURE, BODY FLUID W GRAM STAIN -BOTTLE: Culture: NO GROWTH

## 2014-12-04 LAB — AMMONIA: Ammonia: 46 umol/L — ABNORMAL HIGH (ref 9–35)

## 2014-12-04 MED ORDER — ALBUMIN HUMAN 25 % IV SOLN
12.5000 g | Freq: Once | INTRAVENOUS | Status: AC
  Administered 2014-12-04: 12.5 g via INTRAVENOUS
  Filled 2014-12-04: qty 50

## 2014-12-04 NOTE — Progress Notes (Signed)
Patient to transfer to 5W08. Report called to Brooke,RN.

## 2014-12-04 NOTE — Care Management Important Message (Signed)
Important Message  Patient Details  Name: [name] MRN: [id number] Date of Birth: 01/12/1956   Medicare Important Message Given:  Yes-third notification given    Pricilla Handler 12/04/2014, 1:57 PM

## 2014-12-04 NOTE — Progress Notes (Signed)
Pt states he is no longer agreeable to SNF- pt became very emotional and begged to go home.  CSW paged Dr to inform of conversation  CSW signing off- please contact if pt changes mind- pt does have a bed offer at [facility] and [facility] who would accept him today/tomorrow under LOG.  Domenica Reamer, [place] Social Worker [phone]

## 2014-12-04 NOTE — Discharge Summary (Addendum)
Discharge Summary  [name] VVO:[id number] DOB: 05-30-1955  PCP: Barbette Merino, MD  Admit date: 11/29/2014 Anticipated Discharge date: 12/05/2014  Time spent: 25 minutes  Recommendations for Outpatient Follow-up:  1. Medication change: Spironolactone changed to 50 mg by mouth daily 2. Patient will follow-up with his PCP, Dr. Jonelle Sidle, in the next 2 weeks 3. Patient will be discharged to home with home health PT, RN and aide 4. Advised patient and girlfriend that he needs to continue to commit to taking his medications, watch his fluid intake and avoid all drugs and alcohol  Discharge Diagnoses:  Active Hospital Problems   Diagnosis Date Noted  . GI bleed 01/14/2014  . Chronic diastolic heart failure 26/94/8546  . Acute blood loss anemia   . Alcoholic cirrhosis of liver with ascites   . Acute kidney injury 09/13/2013  . Acute encephalopathy 08/28/2013  . Tobacco abuse 03/11/2013  . Thrombocytopenia 04/27/2012    Resolved Hospital Problems   Diagnosis Date Noted Date Resolved  No resolved problems to display.    Discharge Condition: Patient being discharged to home  Diet recommendation: Low sodium  Filed Weights   11/29/14 1240 11/29/14 2000 11/30/14 1434  Weight: 105.643 kg (232 lb 14.4 oz) 100.517 kg (221 lb 9.6 oz) 99.837 kg (220 lb 1.6 oz)    History of present illness:  59 year old male with past medical history diastolic heart failure, diabetes mellitus and cirrhosis secondary to ongoing alcohol abuse admitted on 7/94 abdominal distention. Patient underwent paracentesis removing 5 L of fluid, but was kept in the hospital after he related black tarry bowel movements for the past 24 hours and a hemoglobin of 7.2, down from 8.5 almost 3 weeks ago.  Hospital Course:  Principal Problem:   Upper GI bleed with acute blood loss anemia: Gastroenterology consulted. Hemoglobin down to 6.7  and patient underwent endoscopy 7/10. Endoscopy noted moderate portal hypertrophy gastropathy  with bleeding that spontaneous resolved and esophageal varices that were nonbleeding. Postprocedure, patient on octreotide drip which was able to be weaned off after several days. Patient's hemoglobin has been stable now for over 48 hours at 7.7.  Active Problems:    Tobacco abuse: Patient advised not to smoke.     Alcoholic cirrhosis of liver with ascites, secondary thrombocytopenia and acute on chronic hepatic encephalopathy: Morning of procedure prior to endoscopy, patient noted to be much more somnolent. Ammonia level ordered and elevated at 176. Restarted on lactulose and by day of discharge, patient much more alert. Ammonia level on 7/14 was 46.  Platelet count on day of discharge was 71. As mentioned above, patient underwent therapeutic paracentesis on admission. Advised patient doing girlfriend he really needs to limit water intake, which girlfriend states he is not compliant with  Chronic kidney disease stage III: Stable    Chronic diastolic heart failure: Stable. Patient has issues with chronic third spacing into the abdominal cavity and chronic 3+ pitting edema which makes trying to get him or system for euvolemic and difficult. Weight is actually down 5 pounds from admission.   Deconditioning: Is a patient's issues with intermittent hepatic encephalopathy from elevated ammonia levels as well as overall deconditioning, after some discussions with his girlfriend, she did not feel that it would be safe for him to go home because someone could not necessarily be within 24 hours a day. After talking to him, the patientwe lengthened and initially was amenable to skilled nursing facility, however when the social worker went to discuss with him about that options  on the afternoon of 7/14, patient became quite emotional and refused to go. I followed up with the patient and he felt like he was okay to go home. In our discussions, I find him alert and oriented and feel that he is within his own mental  capacity to make these decisions.  Consultants:  GI-schooler  Procedures:  : Noting hypertensive portal gastropathy, esophageal varices  Status post paracentesis done 7/9:5 L removed  Status post 1 unit packed red blood cells given 7/10  Discharge Exam: BP 90/50 mmHg  Pulse 55  Temp(Src) 98 F (36.7 C) (Oral)  Resp 18  Ht 5\' 11"  (1.803 m)  Wt 99.837 kg (220 lb 1.6 oz)  BMI 30.71 kg/m2  SpO2 100%  General: Currently Alert and oriented  3, no acute distress Cardiovascular: Regular rate and rhythm, L4-Y5, 2/6 systolic ejection murmur Respiratory: Clear to auscultation bilaterally  Discharge Instructions You were cared for by a hospitalist during your hospital stay. If you have any questions about your discharge medications or the care you received while you were in the hospital after you are discharged, you can call the unit and asked to speak with the hospitalist on call if the hospitalist that took care of you is not available. Once you are discharged, your primary care physician will handle any further medical issues. Please note that NO REFILLS for any discharge medications will be authorized once you are discharged, as it is imperative that you return to your primary care physician (or establish a relationship with a primary care physician if you do not have one) for your aftercare needs so that they can reassess your need for medications and monitor your lab values.      Discharge Instructions    Diet - low sodium heart healthy    Complete by:  As directed      Increase activity slowly    Complete by:  As directed             Medication List    STOP taking these medications        diazepam 5 MG tablet  Commonly known as:  VALIUM     insulin aspart 100 UNIT/ML FlexPen  Commonly known as:  NOVOLOG FLEXPEN     lisinopril 20 MG tablet  Commonly known as:  PRINIVIL,ZESTRIL      TAKE these medications        amitriptyline 25 MG tablet  Commonly known as:   ELAVIL  Take 1 tablet (25 mg total) by mouth at bedtime.     CALCIUM-VITAMIN D PO  Take 100 Units by mouth daily.     clonazePAM 0.25 MG disintegrating tablet  Commonly known as:  KLONOPIN  Take 0.25 mg by mouth 2 (two) times daily.     diclofenac sodium 1 % Gel  Commonly known as:  VOLTAREN  Apply 4 g topically as needed (for back of leg pain).     feeding supplement (ENSURE ENLIVE) Liqd  Take 237 mLs by mouth 2 (two) times daily between meals.     folic acid 1 MG tablet  Commonly known as:  FOLVITE  Take 1 tablet (1 mg total) by mouth daily.     furosemide 40 MG tablet  Commonly known as:  LASIX  Take 40 mg by mouth daily.     lactulose 10 GM/15ML solution  Commonly known as:  CHRONULAC  Take 45 mLs (30 g total) by mouth 3 (three) times daily.     LANTUS  SOLOSTAR 100 UNIT/ML Solostar Pen  Generic drug:  Insulin Glargine  Inject 45 Units into the skin daily at 10 pm.     magnesium oxide 400 (241.3 MG) MG tablet  Commonly known as:  MAG-OX  Take 1 tablet (400 mg total) by mouth 2 (two) times daily.     multivitamin with minerals Tabs tablet  Take 1 tablet by mouth daily.     Oxycodone HCl 10 MG Tabs  Take 10 mg by mouth 3 (three) times daily.     pantoprazole 40 MG tablet  Commonly known as:  PROTONIX  Take 1 tablet (40 mg total) by mouth daily.     propranolol 20 MG tablet  Commonly known as:  INDERAL  Take 20 mg by mouth 2 (two) times daily.     spironolactone 50 MG tablet  Commonly known as:  ALDACTONE  Take 1 tablet (50 mg total) by mouth daily.     thiamine 100 MG tablet  Commonly known as:  VITAMIN B-1  Take 100 mg by mouth daily.     XIFAXAN 550 MG Tabs tablet  Generic drug:  rifaximin  Take 550 mg by mouth 2 (two) times daily.       No Known Allergies    The results of significant diagnostics from this hospitalization (including imaging, microbiology, ancillary and laboratory) are listed below for reference.    Significant Diagnostic  Studies: Dg Chest 2 View  11/29/2014   CLINICAL DATA:  Lower extremity edema, abdominal distention x1 week  EXAM: CHEST - 2 VIEW  COMPARISON:  11/10/2014  FINDINGS: Improved aeration. Lungs are clear. Heart size normal. No pneumothorax. No effusion. Visualized skeletal structures are unremarkable.  IMPRESSION: No acute cardiopulmonary disease.   Electronically Signed   By: Lucrezia Europe M.D.   On: 11/29/2014 12:50   Ct Head Wo Contrast  11/10/2014   CLINICAL DATA:  Altered mental status, hyperglycemia, unresponsive, history diabetes mellitus, cirrhosis, hepatic encephalopathy, COPD, prior stroke, hypertension  EXAM: CT HEAD WITHOUT CONTRAST  TECHNIQUE: Contiguous axial images were obtained from the base of the skull through the vertex without intravenous contrast.  COMPARISON:  08/29/2014  FINDINGS: Mild generalized atrophy.  Normal ventricular morphology.  No midline shift or mass effect.  Otherwise normal appearance of brain parenchyma.  No intracranial hemorrhage, mass lesion, or acute infarction.  Visualized paranasal sinuses clear.  Bones unremarkable.  IMPRESSION: No acute intracranial abnormalities.   Electronically Signed   By: Lavonia Dana M.D.   On: 11/10/2014 13:42   US Paracentesis  11/29/2014   CLINICAL DATA:  Abdominal distention, ascites. Request diagnostic and therapeutic paracentesis of up to 5 L max  EXAM: ULTRASOUND GUIDED PARACENTESIS  COMPARISON:  Previous paracentesis  PROCEDURE: An ultrasound guided paracentesis was thoroughly discussed with the patient and questions answered. The benefits, risks, alternatives and complications were also discussed. The patient understands and wishes to proceed with the procedure. Written consent was obtained.  Ultrasound was performed to localize and mark an adequate pocket of fluid in the left lower quadrant of the abdomen. The area was then prepped and draped in the normal sterile fashion. 1% Lidocaine was used for local anesthesia. Under ultrasound  guidance a Safe-T-Centesis catheter was introduced. Paracentesis was performed. The catheter was removed and a dressing applied.  COMPLICATIONS: None immediate  FINDINGS: A total of approximately 5 L of . Clear yellow. fluid was removed. A fluid sample was sent for laboratory analysis.  IMPRESSION: Successful ultrasound guided paracentesis yielding 5 L  of ascites.  Read by: Ascencion Dike PA-C   Electronically Signed   By: Marybelle Killings M.D.   On: 11/29/2014 14:07   US Paracentesis  11/11/2014   INDICATION: ascites  EXAM: ULTRASOUND-GUIDED PARACENTESIS  COMPARISON:  Previous paracentesis  MEDICATIONS: 10 cc 1% lidocaine  COMPLICATIONS: None immediate  TECHNIQUE: Informed written consent was obtained from the patient after a discussion of the risks, benefits and alternatives to treatment. A timeout was performed prior to the initiation of the procedure.  Initial ultrasound scanning demonstrates a large amount of ascites within the left lower abdominal quadrant. The left lower abdomen was prepped and draped in the usual sterile fashion. 1% lidocaine with epinephrine was used for local anesthesia. Under direct ultrasound guidance, a 19 gauge, 7-cm, Yueh catheter was introduced. An ultrasound image was saved for documentation purposed. The paracentesis was performed. The catheter was removed and a dressing was applied. The patient tolerated the procedure well without immediate post procedural complication.  FINDINGS: A total of approximately 5.8 liters of yellow fluid was removed. Samples were sent to the laboratory as requested by the clinical team.  IMPRESSION: Successful ultrasound-guided paracentesis yielding 5.8 liters of peritoneal fluid.  Read by:  Lavonia Drafts [facility]   Electronically Signed   By: Jerilynn Mages.  Shick M.D.   On: 11/11/2014 15:01   Dg Chest Port 1 View  11/10/2014   CLINICAL DATA:  Status post nasogastric catheter placement  EXAM: PORTABLE CHEST - 1 VIEW  COMPARISON:  11/10/2014  FINDINGS: Cardiac shadow  is stable. Mild right basilar atelectasis is again identified. The overall inspiratory effort is poor. A nasogastric catheter is noted extending into the stomach. No other focal abnormality is noted.  IMPRESSION: Nasogastric catheter within the stomach.   Electronically Signed   By: Inez Catalina M.D.   On: 11/10/2014 15:51   Dg Chest Port 1 View  11/10/2014   CLINICAL DATA:  Unresponsive, unable to follow commands, abdominal distension, history cirrhosis, diabetes mellitus, hypertension, COPD, hepatic encephalopathy  EXAM: PORTABLE CHEST - 1 VIEW  COMPARISON:  Portable exam 1236 hours compared to 09/18/2014  FINDINGS: Decreased lung volumes.  Upper normal heart size.  Normal mediastinal contours and pulmonary vascularity.  Minimal RIGHT basilar atelectasis.  Lungs otherwise clear.  No pleural effusion or pneumothorax.  Bones unremarkable.  IMPRESSION: Minimal RIGHT basilar atelectasis.   Electronically Signed   By: Lavonia Dana M.D.   On: 11/10/2014 13:10    Microbiology: Recent Results (from the past 240 hour(s))  Culture, body fluid-bottle     Status: None (Preliminary result)   Collection Time: 11/29/14  2:09 PM  Result Value Ref Range Status   Specimen Description PERITONEAL  Final   Special Requests NONE  Final   Culture NO GROWTH 4 DAYS  Final   Report Status PENDING  Incomplete  Gram stain     Status: None   Collection Time: 11/29/14  2:09 PM  Result Value Ref Range Status   Specimen Description PERITONEAL  Final   Special Requests NONE  Final   Gram Stain RARE POLYMORPHONUCLEAR NO ORGANISMS SEEN   Final   Report Status 12/01/2014 FINAL  Final     Labs: Basic Metabolic Panel:  Recent Labs Lab 11/30/14 0419 12/01/14 0352 12/02/14 0500 12/03/14 0518 12/04/14 0557  NA 125* 128* 129* 130* 129*  K 5.2* 5.2* 5.1 5.1 5.3*  CL 99* 103 102 103 100*  CO2 21* 19* 21* 22 24  GLUCOSE 100* 106* 141* 144* 130*  BUN 34* 32* 33* 34* 36*  CREATININE 1.48* 1.42* 1.38* 1.47* 1.64*    CALCIUM 7.3* 7.3* 7.5* 7.6* 7.5*   Liver Function Tests:  Recent Labs Lab 11/29/14 1145  AST 114*  ALT 74*  ALKPHOS 105  BILITOT 3.3*  PROT 5.5*  ALBUMIN 2.3*   No results for input(s): LIPASE, AMYLASE in the last 168 hours.  Recent Labs Lab 11/29/14 1145 11/30/14 1215 12/02/14 0556 12/04/14 1131  AMMONIA 47* 176* 106* 46*   CBC:  Recent Labs Lab 11/29/14 1145 11/30/14 0419 12/01/14 0352 12/02/14 0500 12/02/14 1659 12/03/14 0518 12/04/14 0557  WBC 5.5 6.5 5.1 5.3  --  5.7 5.0  NEUTROABS 4.1  --   --   --   --   --   --   HGB 7.2* 6.7* 8.4* 7.6* 7.6* 7.7* 7.1*  HCT 20.8* 19.4* 24.9* 22.3* 22.7* 23.1* 21.3*  MCV 95.0 93.7 94.7 94.1  --  95.5 95.5  PLT 98* 84* 77* 87*  --  95* 71*   Cardiac Enzymes: No results for input(s): CKTOTAL, CKMB, CKMBINDEX, TROPONINI in the last 168 hours. BNP: BNP (last 3 results)  Recent Labs  08/14/14 1910 11/29/14 1145  BNP 29.3 29.4    ProBNP (last 3 results)  Recent Labs  02/08/14 1830 02/10/14 1547  PROBNP 966.3* 805.5*    CBG:  Recent Labs Lab 12/03/14 1226 12/03/14 1650 12/03/14 2110 12/04/14 0840 12/04/14 1226  GLUCAP 147* 113* 166* 115* 129*       Signed:  Axyl Sitzman K  [facility] 12/04/2014, 1:40 PM

## 2014-12-04 NOTE — Progress Notes (Signed)
Pt verbalized understanding of discharge instruction. Pt IV dc with no complications. Pt education Pt instructed when to follow up with physician and when their follow up appointments are. Pt dc with spouse and all belongings. Franchot Erichsen, RN

## 2014-12-04 NOTE — Clinical Social Work Note (Signed)
Clinical Social Work Assessment  Patient Details  Name: [name] MRN: [id number] Date of Birth: 05/18/1956  Date of referral:  12/04/14               Reason for consult:  Facility Placement                Permission sought to share information with:  Chartered certified accountant granted to share information::  Yes, Verbal Permission Granted  Name::     [place]::     Relationship::     Contact Information:     Housing/Transportation Living arrangements for the past 2 months:  Single Family Home Source of Information:  Patient Patient Interpreter Needed:  None Criminal Activity/Legal Involvement Pertinent to Current Situation/Hospitalization:  No - Comment as needed Significant Relationships:  Significant Other, Siblings Lives with:  Significant Other Do you feel safe going back to the place where you live?  Yes Need for family participation in patient care:  Yes (Comment)  Care giving concerns:  Pt lives at home with girlfriend of 48 years- pt states that she is able to assist him 24 hours a day but pt was not able to DC home yesterday due to caregiving concerns.  Unclear how much support with acutally have at home pt reports that his brother will be coming in a month and can help him then- states that brother was caregiver after last admission   Social Worker assessment / plan:  CSW discussed Dr recommendation for SNF/ dr feeling that pt will not be successful at home due to confusion and weakness  Employment status:    Insurance information:  Programmer, applications PT Recommendations:  Home with [place] / Referral to community resources:  [place]  Patient/Family's Response to care:  Pt was very emotional during interview and states that he has been getting the run around from the [employer] staff- pt recognizes that he was agreeable to SNF when speaking with the dr this morning but states that he wants to go  home- pt pleaded to be sent home and became very tearful.  Pt states that SNF almost killed him the last time and that he would be safe at home.  Pt speech was repetitive and pt did not always respond appropriately to CSW questioning- would not answer the question at hand  Patient/Family's Understanding of and Emotional Response to Diagnosis, Current Treatment, and Prognosis:  Unclear- pt states that the doctors have been giving him the runaround and that he doesn't understand what is causing his current illness  Emotional Assessment Appearance:  Appears stated age, Disheveled Attitude/Demeanor/Rapport:  Crying, Reactive Affect (typically observed):  Agitated, Tearful/Crying Orientation:  Oriented to Self, Oriented to Place, Oriented to Situation Alcohol / Substance use:  Not Applicable Psych involvement (Current and /or in the community):  No (Comment)  Discharge Needs  Concerns to be addressed:  Home Safety Concerns, Cognitive Concerns, Decision making concerns Readmission within the last 30 days:  Yes Current discharge risk:  Physical Impairment, Cognitively Impaired Barriers to Discharge:  No Barriers Identified   Cranford Mon, LCSW 12/04/2014, 3:38 PM

## 2014-12-04 NOTE — Clinical Social Work Placement (Signed)
   CLINICAL SOCIAL WORK PLACEMENT  NOTE  Date:  12/04/2014  Patient Details  Name: [name] MRN: [id number] Date of Birth: 1955-12-03  Clinical Social Work is seeking post-discharge placement for this patient at the [place] level of care (*CSW will initial, date and re-position this form in  chart as items are completed):  Yes   Patient/family provided with [place] Work Department's list of facilities offering this level of care within the geographic area requested by the patient (or if unable, by the patient's family).  Yes   Patient/family informed of their freedom to choose among providers that offer the needed level of care, that participate in Medicare, Medicaid or managed care program needed by the patient, have an available bed and are willing to accept the patient.  Yes   Patient/family informed of [place]'s ownership interest in [facility] and [facility], as well as of the fact that they are under no obligation to receive care at these facilities.  PASRR submitted to EDS on 12/04/14     PASRR number received on 12/04/14     Existing PASRR number confirmed on       FL2 transmitted to all facilities in geographic area requested by pt/family on 12/04/14     FL2 transmitted to all facilities within larger geographic area on       Patient informed that his/her managed care company has contracts with or will negotiate with certain facilities, including the following:            Patient/family informed of bed offers received.  Patient chooses bed at       Physician recommends and patient chooses bed at      Patient to be transferred to   on  .  Patient to be transferred to facility by       Patient family notified on   of transfer.  Name of family member notified:        PHYSICIAN Please sign FL2     Additional Comment:    _______________________________________________ Cranford Mon, LCSW 12/04/2014, 12:10  PM

## 2014-12-04 NOTE — Progress Notes (Signed)
CSW received consult for SNF placement- per MD pt is now agreeable to SNF.  CSW initiated bed search- offers pending  CSW will continue to follow.  Domenica Reamer, [place] Social Worker [phone]

## 2014-12-09 ENCOUNTER — Encounter (HOSPITAL_COMMUNITY): Payer: Self-pay | Admitting: Emergency Medicine

## 2014-12-09 ENCOUNTER — Emergency Department (HOSPITAL_COMMUNITY)
Admission: EM | Admit: 2014-12-09 | Discharge: 2014-12-09 | Disposition: A | Discharge status: Home / Self Care | Payer: Medicare PPO | Attending: Emergency Medicine | Admitting: Emergency Medicine

## 2014-12-09 ENCOUNTER — Emergency Department (HOSPITAL_COMMUNITY): Payer: Medicare PPO

## 2014-12-09 DIAGNOSIS — K7031 Alcoholic cirrhosis of liver with ascites: Secondary | ICD-10-CM | POA: Insufficient documentation

## 2014-12-09 DIAGNOSIS — M7989 Other specified soft tissue disorders: Secondary | ICD-10-CM | POA: Diagnosis not present

## 2014-12-09 DIAGNOSIS — E119 Type 2 diabetes mellitus without complications: Secondary | ICD-10-CM | POA: Diagnosis not present

## 2014-12-09 DIAGNOSIS — Z79899 Other long term (current) drug therapy: Secondary | ICD-10-CM | POA: Insufficient documentation

## 2014-12-09 DIAGNOSIS — J449 Chronic obstructive pulmonary disease, unspecified: Secondary | ICD-10-CM | POA: Diagnosis not present

## 2014-12-09 DIAGNOSIS — Z792 Long term (current) use of antibiotics: Secondary | ICD-10-CM | POA: Insufficient documentation

## 2014-12-09 DIAGNOSIS — G8929 Other chronic pain: Secondary | ICD-10-CM | POA: Diagnosis not present

## 2014-12-09 DIAGNOSIS — D649 Anemia, unspecified: Secondary | ICD-10-CM | POA: Diagnosis not present

## 2014-12-09 DIAGNOSIS — Z8673 Personal history of transient ischemic attack (TIA), and cerebral infarction without residual deficits: Secondary | ICD-10-CM | POA: Insufficient documentation

## 2014-12-09 DIAGNOSIS — M1990 Unspecified osteoarthritis, unspecified site: Secondary | ICD-10-CM | POA: Insufficient documentation

## 2014-12-09 DIAGNOSIS — K219 Gastro-esophageal reflux disease without esophagitis: Secondary | ICD-10-CM | POA: Insufficient documentation

## 2014-12-09 DIAGNOSIS — R140 Abdominal distension (gaseous): Secondary | ICD-10-CM | POA: Diagnosis present

## 2014-12-09 DIAGNOSIS — Z8669 Personal history of other diseases of the nervous system and sense organs: Secondary | ICD-10-CM | POA: Insufficient documentation

## 2014-12-09 DIAGNOSIS — Z720 Tobacco use: Secondary | ICD-10-CM | POA: Insufficient documentation

## 2014-12-09 DIAGNOSIS — I10 Essential (primary) hypertension: Secondary | ICD-10-CM | POA: Diagnosis not present

## 2014-12-09 LAB — COMPREHENSIVE METABOLIC PANEL
ALT: 40 U/L (ref 17–63)
AST: 84 U/L — ABNORMAL HIGH (ref 15–41)
Albumin: 2.3 g/dL — ABNORMAL LOW (ref 3.5–5.0)
Alkaline Phosphatase: 114 U/L (ref 38–126)
Anion gap: 7 (ref 5–15)
BUN: 38 mg/dL — ABNORMAL HIGH (ref 6–20)
CO2: 22 mmol/L (ref 22–32)
Calcium: 8.2 mg/dL — ABNORMAL LOW (ref 8.9–10.3)
Chloride: 100 mmol/L — ABNORMAL LOW (ref 101–111)
Creatinine, Ser: 1.54 mg/dL — ABNORMAL HIGH (ref 0.61–1.24)
GFR calc Af Amer: 56 mL/min — ABNORMAL LOW (ref >60)
GFR calc non Af Amer: 48 mL/min — ABNORMAL LOW (ref >60)
Glucose, Bld: 132 mg/dL — ABNORMAL HIGH (ref 65–99)
Potassium: 4.8 mmol/L (ref 3.5–5.1)
Sodium: 129 mmol/L — ABNORMAL LOW (ref 135–145)
Total Bilirubin: 2.4 mg/dL — ABNORMAL HIGH (ref 0.3–1.2)
Total Protein: 5.8 g/dL — ABNORMAL LOW (ref 6.5–8.1)

## 2014-12-09 LAB — CBC WITH DIFFERENTIAL/PLATELET
Basophils Absolute: 0 10*3/uL (ref 0.0–0.1)
Basophils Relative: 1 % (ref 0–1)
EOS ABS: 0.2 10*3/uL (ref 0.0–0.7)
Eosinophils Relative: 6 % — ABNORMAL HIGH (ref 0–5)
HEMATOCRIT: 22.8 % — AB (ref 39.0–52.0)
Hemoglobin: 7.6 g/dL — ABNORMAL LOW (ref 13.0–17.0)
LYMPHS PCT: 22 % (ref 12–46)
Lymphs Abs: 0.8 10*3/uL (ref 0.7–4.0)
MCH: 30.9 pg (ref 26.0–34.0)
MCHC: 33.3 g/dL (ref 30.0–36.0)
MCV: 92.7 fL (ref 78.0–100.0)
MONOS PCT: 13 % — AB (ref 3–12)
Monocytes Absolute: 0.5 10*3/uL (ref 0.1–1.0)
NEUTROS ABS: 2.2 10*3/uL (ref 1.7–7.7)
Neutrophils Relative %: 58 % (ref 43–77)
Platelets: 68 10*3/uL — ABNORMAL LOW (ref 150–400)
RBC: 2.46 MIL/uL — AB (ref 4.22–5.81)
RDW: 16.2 % — AB (ref 11.5–15.5)
WBC: 3.7 10*3/uL — ABNORMAL LOW (ref 4.0–10.5)

## 2014-12-09 LAB — PROTIME-INR
INR: 1.5 — ABNORMAL HIGH (ref 0.00–1.49)
Prothrombin Time: 18.1 seconds — ABNORMAL HIGH (ref 11.6–15.2)

## 2014-12-09 NOTE — ED Notes (Signed)
Patient transported to X-ray 

## 2014-12-09 NOTE — ED Notes (Signed)
Pt from home for eval of abd distention, pt states "stomach has been bloated for a long time but got worse yesterday and Im sore all over." Pt denies any n/v/d or fevers, also denies any sob or cp. Pt alert and oriented, reports generalized pain. nad noted.

## 2014-12-09 NOTE — ED Provider Notes (Signed)
CSN: [id number]     Arrival date & time 12/09/14  1013 History   First MD Initiated Contact with Patient 12/09/14 1021     Chief Complaint  Patient presents with  . Bloated     (Consider location/radiation/quality/duration/timing/severity/associated sxs/prior Treatment) HPI Comments: Pt presents with with abdominal distention, leg swelling, "sore all over". Initiallt denies fever, chills, n/v, d/a, SOB, chest pain. Blood in stool. Symptoms are chronic, reoccurring.   The history is provided by the patient. No language interpreter was used.    Past Medical History  Diagnosis Date  . Neuropathy   . Diabetes mellitus   . Bipolar affect, depressed   . Hypertension   . Arthritis   . Stroke     Mini stroke about 34yrs ago  . Cirrhosis   . Alcohol abuse   . Chronic pain   . Cocaine abuse   . Muscle spasm     both legs  . Encephalopathy, hepatic   . Detached retina   . COPD (chronic obstructive pulmonary disease)     emphysema  . Bronchitis   . Barrett's esophagus   . GERD (gastroesophageal reflux disease)     has ulcer  . Anemia    Past Surgical History  Procedure Laterality Date  . Fracture surgery      Leg and arm 29yrs ago  . Esophagogastroduodenoscopy  04/04/2012    Procedure: ESOPHAGOGASTRODUODENOSCOPY (EGD);  Surgeon: Irene Shipper, MD;  Location: [facility] ENDOSCOPY;  Service: Endoscopy;  Laterality: N/A;  . Esophagogastroduodenoscopy Left 03/13/2013    Procedure: ESOPHAGOGASTRODUODENOSCOPY (EGD);  Surgeon: Arta Silence, MD;  Location: [facility] ENDOSCOPY;  Service: Endoscopy;  Laterality: Left;  Marland Kitchen Eye surgery  8 months ago both eyes    cataracts both eyes, detached eye, gas pocket  . Vasectomy    . Pars plana vitrectomy Left 07/08/2013    Procedure: PARS PLANA VITRECTOMY WITH 25 GAUGE;  Surgeon: Hurman Horn, MD;  Location: [place];  Service: Ophthalmology;  Laterality: Left;  . Intraocular lens removal Left 07/08/2013    Procedure: REMOVAL OF INTRAOCULAR LENS;  Surgeon: Hurman Horn, MD;  Location: [place];  Service: Ophthalmology;  Laterality: Left;  . Placement and suture of secondary intraocular lens Left 07/08/2013    Procedure: PLACEMENT AND SUTURE OF SECONDARY INTRAOCULAR LENS;  Surgeon: Hurman Horn, MD;  Location: [place];  Service: Ophthalmology;  Laterality: Left;  Insertion of Anterior Capsule Intraocular Lens   . Esophagogastroduodenoscopy N/A 01/16/2014    Procedure: ESOPHAGOGASTRODUODENOSCOPY (EGD);  Surgeon: Winfield Cunas., MD;  Location[facility] ENDOSCOPY;  Service: Endoscopy;  Laterality: N/A;  . Colonoscopy N/A 01/17/2014    Procedure: COLONOSCOPY;  Surgeon: Winfield Cunas., MD;  Location: [facility] ENDOSCOPY;  Service: Endoscopy;  Laterality: N/A;  possible banding  . Esophagogastroduodenoscopy N/A 08/30/2014    Procedure: ESOPHAGOGASTRODUODENOSCOPY (EGD);  Surgeon: Wilford Corner, MD;  Location: [facility] ENDOSCOPY;  Service: Endoscopy;  Laterality: N/A;  bedside  . Esophagogastroduodenoscopy N/A 11/30/2014    Procedure: ESOPHAGOGASTRODUODENOSCOPY (EGD);  Surgeon: Wilford Corner, MD;  Location: [facility] ENDOSCOPY;  Service: Endoscopy;  Laterality: N/A;   Family History  Problem Relation Age of Onset  . Hypotension Mother    History  Substance Use Topics  . Smoking status: Current Every Day Smoker -- 1.00 packs/day for 30 years    Types: Cigarettes  . Smokeless tobacco: Never Used  . Alcohol Use: 0.0 oz/week     Comment: 12 pk beer daily  06/2013 - no alcohol since  11/2012    Review of Systems  Constitutional: Negative for fever, activity change, appetite change and fatigue.  HENT: Negative for congestion, facial swelling, rhinorrhea and trouble swallowing.   Eyes: Negative for photophobia and pain.  Respiratory: Negative for cough, chest tightness and shortness of breath.   Cardiovascular: Positive for leg swelling. Negative for chest pain.  Gastrointestinal: Positive for abdominal distention. Negative for nausea, vomiting, abdominal pain, diarrhea and  constipation.  Endocrine: Negative for polydipsia and polyuria.  Genitourinary: Negative for dysuria, urgency, decreased urine volume and difficulty urinating.  Musculoskeletal: Negative for back pain and gait problem.  Skin: Negative for color change, rash and wound.  Allergic/Immunologic: Negative for immunocompromised state.  Neurological: Negative for dizziness, facial asymmetry, speech difficulty, weakness, numbness and headaches.  Psychiatric/Behavioral: Negative for confusion, decreased concentration and agitation.      Allergies  Review of patient's allergies indicates no known allergies.  Home Medications   Prior to Admission medications   Medication Sig Start Date End Date Taking? Authorizing Provider  amitriptyline (ELAVIL) 25 MG tablet Take 1 tablet (25 mg total) by mouth at bedtime. 10/05/14   Nita Sells, MD  CALCIUM-VITAMIN D PO Take 100 Units by mouth daily.    Historical Provider, MD  clonazePAM (KLONOPIN) 0.25 MG disintegrating tablet Take 0.25 mg by mouth 2 (two) times daily.  10/17/14   Historical Provider, MD  diclofenac sodium (VOLTAREN) 1 % GEL Apply 4 g topically as needed (for back of leg pain).    Historical Provider, MD  feeding supplement, ENSURE ENLIVE, (ENSURE ENLIVE) LIQD Take 237 mLs by mouth 2 (two) times daily between meals. 11/13/14   Maryann Mikhail, DO  folic acid (FOLVITE) 1 MG tablet Take 1 tablet (1 mg total) by mouth daily. 10/05/14   Nita Sells, MD  furosemide (LASIX) 40 MG tablet Take 40 mg by mouth daily.  10/25/14   Historical Provider, MD  lactulose (CHRONULAC) 10 GM/15ML solution Take 45 mLs (30 g total) by mouth 3 (three) times daily. 12/03/14   Annita Brod, MD  LANTUS SOLOSTAR 100 UNIT/ML Solostar Pen Inject 45 Units into the skin daily at 10 pm.  11/07/14   Historical Provider, MD  magnesium oxide (MAG-OX) 400 (241.3 MG) MG tablet Take 1 tablet (400 mg total) by mouth 2 (two) times daily. 09/03/14   Reyne Dumas, MD   Multiple Vitamin (MULTIVITAMIN WITH MINERALS) TABS tablet Take 1 tablet by mouth daily. 07/31/14   Geradine Girt, DO  Oxycodone HCl 10 MG TABS Take 10 mg by mouth 3 (three) times daily. 11/14/14   Historical Provider, MD  pantoprazole (PROTONIX) 40 MG tablet Take 1 tablet (40 mg total) by mouth daily. 12/03/14   Annita Brod, MD  propranolol (INDERAL) 20 MG tablet Take 20 mg by mouth 2 (two) times daily. 09/24/14   Historical Provider, MD  spironolactone (ALDACTONE) 50 MG tablet Take 1 tablet (50 mg total) by mouth daily. 12/03/14   Annita Brod, MD  thiamine (VITAMIN B-1) 100 MG tablet Take 100 mg by mouth daily.    Historical Provider, MD  XIFAXAN 550 MG TABS tablet Take 550 mg by mouth 2 (two) times daily.  10/29/14   Historical Provider, MD   BP 130/62 mmHg  Pulse 85  Temp(Src) 98.5 F (36.9 C) (Oral)  Resp 16  SpO2 99% Physical Exam  Constitutional: He is oriented to person, place, and time. He appears well-developed and well-nourished. No distress.  HENT:  Head: Normocephalic and atraumatic.  Mouth/Throat:  No oropharyngeal exudate.  Eyes: Pupils are equal, round, and reactive to light.  Neck: Normal range of motion. Neck supple.  Cardiovascular: Normal rate, regular rhythm and normal heart sounds.  Exam reveals no gallop and no friction rub.   No murmur heard. Pulmonary/Chest: Effort normal. No respiratory distress. He has no wheezes. He has rales.  Abdominal: Soft. Bowel sounds are normal. He exhibits distension, fluid wave and ascites. He exhibits no mass. There is no tenderness. There is no rebound and no guarding.  Reports he feels "tight with palpation"  Musculoskeletal: Normal range of motion. He exhibits edema (3+BLLE). He exhibits no tenderness.  Neurological: He is alert and oriented to person, place, and time.  Skin: Skin is warm and dry.  Psychiatric: He has a normal mood and affect.    ED Course  Procedures (including critical care time) Labs Review Labs  Reviewed  CBC WITH DIFFERENTIAL/PLATELET  COMPREHENSIVE METABOLIC PANEL  PROTIME-INR    Imaging Review No results found.   EKG Interpretation None      MDM   Final diagnoses:  None    Pt is a 59 y.o. 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 Initially denies SOB, then says he has felt SOB since last discharge. Last paracentesis was 11/29/14. Spoke w/ IR who rec f/u with Der. Drazek for possible TIPS, spoke w/ IR PA who will work pt in for paracentesis from ED today.  IR is unable to squeeze patient for paracentesis today.    As he is well-appearing and I doubt SPB, I feel can be done tomorrow as an outpatient has been scheduled for 9:45 AM in the radiology department.  CBC and BMP are grossly stable.  Chest x-ray  does not show signs of fluid overload.   Dickie La evaluation in the Emergency Department is complete. It has been determined that no acute conditions requiring further emergency intervention are present at this time. The patient/guardian have been advised of the diagnosis and plan. We have discussed signs and symptoms that warrant return to the ED, such as changes or worsening in symptoms, worsening pain, fever, worsening abdominal pain.      Ernestina Patches, MD 12/09/14 1320

## 2014-12-09 NOTE — Discharge Instructions (Signed)
Ascites °Ascites is a gathering of fluid in the belly (abdomen). This is most often caused by liver disease. It may also be caused by a number of other less common problems. It causes a ballooning out (distension) of the abdomen. °CAUSES  °Scarring of the liver (cirrhosis) is the most common cause of ascites. Other causes include: °· Infection or inflammation in the abdomen. °· Cancer in the abdomen. °· Heart failure. °· Certain forms of kidney failure (nephritic syndrome). °· Inflammation of the pancreas. °· Clots in the veins of the liver. °SYMPTOMS  °In the early stages of ascites, you may not have any symptoms. The main symptom of ascites is a sense of abdominal bloating. This is due to the presence of fluid. This may also cause an increase in abdominal or waist size. People with this condition can develop swelling in the legs, and men can develop a swollen scrotum. When there is a lot of fluid, it may be hard to breath. Stretching of the abdomen by fluid can be painful. °DIAGNOSIS  °Certain features of your medical history, such as a history of liver disease and of an enlarging abdomen, can suggest the presence of ascites. The diagnosis of ascites can be made on physical exam by your caregiver. An abdominal ultrasound examination can confirm that ascites is present, and estimate the amount of fluid. °Once ascites is confirmed, it is important to determine its cause. Again, a history of one of the conditions listed in "CAUSES" provides a strong clue. A physical exam is important, and blood and X-ray tests may be needed. During a procedure called paracentesis, a sample of fluid is removed from the abdomen. This can determine certain key features about the fluid, such as whether or not infection or cancer is present. Your caregiver will determine if a paracentesis is necessary. They will describe the procedure to you. °PREVENTION  °Ascites is a complication of other conditions. Therefore to prevent ascites, you  must seek treatment for any significant health conditions you have. Once ascites is present, careful attention to fluid and salt intake may help prevent it from getting worse. If you have ascites, you should not drink alcohol. °PROGNOSIS  °The prognosis of ascites depends on the underlying disease. If the disease is reversible, such as with certain infections or with heart failure, then ascites may improve or disappear. When ascites is caused by cirrhosis, then it indicates that the liver disease has worsened, and further evaluation and treatment of the liver disease is needed. If your ascites is caused by cancer, then the success or failure of the cancer treatment will determine whether your ascites will improve or worsen. °RISKS AND COMPLICATIONS  °Ascites is likely to worsen if it is not properly diagnosed and treated. A large amount of ascites can cause pain and difficulty breathing. The main complication, besides worsening, is infection (called spontaneous bacterial peritonitis). This requires prompt treatment. °TREATMENT  °The treatment of ascites depends on its cause. When liver disease is your cause, medical management using water pills (diuretics) and decreasing salt intake is often effective. Ascites due to peritoneal inflammation or malignancy (cancer) alone does not respond to salt restriction and diuretics. Hospitalization is sometimes required. °If the treatment of ascites cannot be managed with medications, a number of other treatments are available. Your caregivers will help you decide which will work best for you. Some of these are: °· Removal of fluid from the abdomen (paracentesis). °· Fluid from the abdomen is passed into a vein (peritoneovenous shunting). °·   Liver transplantation. °· Transjugular intrahepatic portosystemic stent shunt. °HOME CARE INSTRUCTIONS  °It is important to monitor body weight and the intake and output of fluids. Weigh yourself at the same time every day. Record your  weights. Fluid restriction may be necessary. It is also important to know your salt intake. The more salt you take in, the more fluid you will retain. Ninety percent of people with ascites respond to this approach. °· Follow any directions for medicines carefully. °· Follow up with your caregiver, as directed. °· Report any changes in your health, especially any new or worsening symptoms. °· If your ascites is from liver disease, avoid alcohol and other substances toxic to the liver. °SEEK MEDICAL CARE IF:  °· Your weight increases more than a few pounds in a few days. °· Your abdominal or waist size increases. °· You develop swelling in your legs. °· You had swelling and it worsens. °SEEK IMMEDIATE MEDICAL CARE IF:  °· You develop a fever. °· You develop new abdominal pain. °· You develop difficulty breathing. °· You develop confusion. °· You have bleeding from the mouth, stomach, or rectum. °MAKE SURE YOU:  °· Understand these instructions. °· Will watch your condition. °· Will get help right away if you are not doing well or get worse. °Document Released: 05/09/2005 Document Revised: 08/01/2011 Document Reviewed: 12/08/2006 °ExitCare® Patient Information ©2015 [employer]. This information is not intended to replace advice given to you by your health care provider. Make sure you discuss any questions you have with your health care provider. ° °

## 2014-12-10 ENCOUNTER — Other Ambulatory Visit (HOSPITAL_COMMUNITY): Payer: Self-pay | Admitting: Emergency Medicine

## 2014-12-10 ENCOUNTER — Ambulatory Visit (HOSPITAL_COMMUNITY)
Admission: RE | Admit: 2014-12-10 | Discharge: 2014-12-10 | Disposition: A | Discharge status: Home / Self Care | Payer: Medicare PPO | Source: Ambulatory Visit | Attending: Emergency Medicine | Admitting: Emergency Medicine

## 2014-12-10 DIAGNOSIS — R188 Other ascites: Secondary | ICD-10-CM

## 2014-12-10 DIAGNOSIS — R161 Splenomegaly, not elsewhere classified: Secondary | ICD-10-CM | POA: Insufficient documentation

## 2014-12-10 DIAGNOSIS — K7460 Unspecified cirrhosis of liver: Secondary | ICD-10-CM

## 2014-12-10 DIAGNOSIS — K824 Cholesterolosis of gallbladder: Secondary | ICD-10-CM | POA: Insufficient documentation

## 2014-12-10 DIAGNOSIS — C220 Liver cell carcinoma: Secondary | ICD-10-CM

## 2014-12-10 MED ORDER — LIDOCAINE HCL (PF) 1 % IJ SOLN
INTRAMUSCULAR | Status: AC
  Filled 2014-12-10: qty 10

## 2014-12-12 ENCOUNTER — Other Ambulatory Visit: Payer: Self-pay | Admitting: Gastroenterology

## 2014-12-12 DIAGNOSIS — R160 Hepatomegaly, not elsewhere classified: Secondary | ICD-10-CM

## 2014-12-12 DIAGNOSIS — K7469 Other cirrhosis of liver: Secondary | ICD-10-CM

## 2014-12-14 ENCOUNTER — Emergency Department (HOSPITAL_COMMUNITY): Payer: Medicare PPO

## 2014-12-14 ENCOUNTER — Inpatient Hospital Stay (HOSPITAL_COMMUNITY)
Admission: EM | Admit: 2014-12-14 | Discharge: 2014-12-20 | DRG: 420 | Disposition: A | Discharge status: Skilled Nursing Facility | Payer: Medicare PPO | Attending: Internal Medicine | Admitting: Internal Medicine

## 2014-12-14 ENCOUNTER — Encounter (HOSPITAL_COMMUNITY): Payer: Self-pay | Admitting: Physical Medicine and Rehabilitation

## 2014-12-14 DIAGNOSIS — J189 Pneumonia, unspecified organism: Secondary | ICD-10-CM

## 2014-12-14 DIAGNOSIS — Z6828 Body mass index (BMI) 28.0-28.9, adult: Secondary | ICD-10-CM | POA: Diagnosis not present

## 2014-12-14 DIAGNOSIS — F17210 Nicotine dependence, cigarettes, uncomplicated: Secondary | ICD-10-CM | POA: Diagnosis present

## 2014-12-14 DIAGNOSIS — F1410 Cocaine abuse, uncomplicated: Secondary | ICD-10-CM | POA: Diagnosis present

## 2014-12-14 DIAGNOSIS — K922 Gastrointestinal hemorrhage, unspecified: Secondary | ICD-10-CM | POA: Diagnosis not present

## 2014-12-14 DIAGNOSIS — L8990 Pressure ulcer of unspecified site, unspecified stage: Secondary | ICD-10-CM | POA: Diagnosis present

## 2014-12-14 DIAGNOSIS — N170 Acute kidney failure with tubular necrosis: Secondary | ICD-10-CM | POA: Diagnosis not present

## 2014-12-14 DIAGNOSIS — K7290 Hepatic failure, unspecified without coma: Secondary | ICD-10-CM | POA: Diagnosis present

## 2014-12-14 DIAGNOSIS — Z794 Long term (current) use of insulin: Secondary | ICD-10-CM | POA: Diagnosis not present

## 2014-12-14 DIAGNOSIS — F1010 Alcohol abuse, uncomplicated: Secondary | ICD-10-CM | POA: Diagnosis present

## 2014-12-14 DIAGNOSIS — J9600 Acute respiratory failure, unspecified whether with hypoxia or hypercapnia: Secondary | ICD-10-CM | POA: Diagnosis present

## 2014-12-14 DIAGNOSIS — J449 Chronic obstructive pulmonary disease, unspecified: Secondary | ICD-10-CM | POA: Diagnosis present

## 2014-12-14 DIAGNOSIS — K7682 Hepatic encephalopathy: Secondary | ICD-10-CM

## 2014-12-14 DIAGNOSIS — D649 Anemia, unspecified: Secondary | ICD-10-CM | POA: Diagnosis present

## 2014-12-14 DIAGNOSIS — K3189 Other diseases of stomach and duodenum: Secondary | ICD-10-CM | POA: Diagnosis present

## 2014-12-14 DIAGNOSIS — K7460 Unspecified cirrhosis of liver: Secondary | ICD-10-CM | POA: Diagnosis present

## 2014-12-14 DIAGNOSIS — K219 Gastro-esophageal reflux disease without esophagitis: Secondary | ICD-10-CM | POA: Diagnosis present

## 2014-12-14 DIAGNOSIS — I10 Essential (primary) hypertension: Secondary | ICD-10-CM | POA: Diagnosis present

## 2014-12-14 DIAGNOSIS — E119 Type 2 diabetes mellitus without complications: Secondary | ICD-10-CM

## 2014-12-14 DIAGNOSIS — I5032 Chronic diastolic (congestive) heart failure: Secondary | ICD-10-CM | POA: Diagnosis present

## 2014-12-14 DIAGNOSIS — E876 Hypokalemia: Secondary | ICD-10-CM | POA: Diagnosis not present

## 2014-12-14 DIAGNOSIS — E1140 Type 2 diabetes mellitus with diabetic neuropathy, unspecified: Secondary | ICD-10-CM | POA: Diagnosis present

## 2014-12-14 DIAGNOSIS — D6959 Other secondary thrombocytopenia: Secondary | ICD-10-CM | POA: Diagnosis present

## 2014-12-14 DIAGNOSIS — D696 Thrombocytopenia, unspecified: Secondary | ICD-10-CM | POA: Diagnosis present

## 2014-12-14 DIAGNOSIS — E43 Unspecified severe protein-calorie malnutrition: Secondary | ICD-10-CM | POA: Diagnosis present

## 2014-12-14 DIAGNOSIS — G8929 Other chronic pain: Secondary | ICD-10-CM | POA: Diagnosis present

## 2014-12-14 DIAGNOSIS — Z79899 Other long term (current) drug therapy: Secondary | ICD-10-CM

## 2014-12-14 DIAGNOSIS — K7031 Alcoholic cirrhosis of liver with ascites: Secondary | ICD-10-CM | POA: Diagnosis present

## 2014-12-14 DIAGNOSIS — N179 Acute kidney failure, unspecified: Secondary | ICD-10-CM | POA: Diagnosis present

## 2014-12-14 DIAGNOSIS — K766 Portal hypertension: Secondary | ICD-10-CM | POA: Diagnosis present

## 2014-12-14 DIAGNOSIS — K2270 Barrett's esophagus without dysplasia: Secondary | ICD-10-CM | POA: Diagnosis present

## 2014-12-14 DIAGNOSIS — F319 Bipolar disorder, unspecified: Secondary | ICD-10-CM | POA: Diagnosis present

## 2014-12-14 DIAGNOSIS — Z8673 Personal history of transient ischemic attack (TIA), and cerebral infarction without residual deficits: Secondary | ICD-10-CM | POA: Diagnosis not present

## 2014-12-14 DIAGNOSIS — Z9114 Patient's other noncompliance with medication regimen: Secondary | ICD-10-CM | POA: Diagnosis not present

## 2014-12-14 DIAGNOSIS — K7030 Alcoholic cirrhosis of liver without ascites: Secondary | ICD-10-CM | POA: Diagnosis not present

## 2014-12-14 DIAGNOSIS — R4182 Altered mental status, unspecified: Secondary | ICD-10-CM | POA: Diagnosis present

## 2014-12-14 DIAGNOSIS — I8510 Secondary esophageal varices without bleeding: Secondary | ICD-10-CM | POA: Diagnosis present

## 2014-12-14 DIAGNOSIS — R188 Other ascites: Secondary | ICD-10-CM

## 2014-12-14 LAB — I-STAT ARTERIAL BLOOD GAS, ED
Acid-base deficit: 1 mmol/L (ref 0.0–2.0)
Bicarbonate: 22.6 mEq/L (ref 20.0–24.0)
O2 Saturation: 100 %
PCO2 ART: 32.7 mmHg — AB (ref 35.0–45.0)
PH ART: 7.447 (ref 7.350–7.450)
TCO2: 24 mmol/L (ref 0–100)
pO2, Arterial: 405 mmHg — ABNORMAL HIGH (ref 80.0–100.0)

## 2014-12-14 LAB — CBG MONITORING, ED: Glucose-Capillary: 111 mg/dL — ABNORMAL HIGH (ref 65–99)

## 2014-12-14 LAB — COMPREHENSIVE METABOLIC PANEL
ALT: 39 U/L (ref 17–63)
AST: 84 U/L — AB (ref 15–41)
Albumin: 2.2 g/dL — ABNORMAL LOW (ref 3.5–5.0)
Alkaline Phosphatase: 112 U/L (ref 38–126)
Anion gap: 9 (ref 5–15)
BUN: 41 mg/dL — ABNORMAL HIGH (ref 6–20)
CO2: 21 mmol/L — AB (ref 22–32)
Calcium: 7.9 mg/dL — ABNORMAL LOW (ref 8.9–10.3)
Chloride: 103 mmol/L (ref 101–111)
Creatinine, Ser: 2.16 mg/dL — ABNORMAL HIGH (ref 0.61–1.24)
GFR calc Af Amer: 37 mL/min — ABNORMAL LOW (ref >60)
GFR calc non Af Amer: 32 mL/min — ABNORMAL LOW (ref >60)
Glucose, Bld: 128 mg/dL — ABNORMAL HIGH (ref 65–99)
Potassium: 5.2 mmol/L — ABNORMAL HIGH (ref 3.5–5.1)
SODIUM: 133 mmol/L — AB (ref 135–145)
TOTAL PROTEIN: 5.9 g/dL — AB (ref 6.5–8.1)
Total Bilirubin: 2.2 mg/dL — ABNORMAL HIGH (ref 0.3–1.2)

## 2014-12-14 LAB — CBC
HCT: 21.9 % — ABNORMAL LOW (ref 39.0–52.0)
Hemoglobin: 7.4 g/dL — ABNORMAL LOW (ref 13.0–17.0)
MCH: 29.7 pg (ref 26.0–34.0)
MCHC: 33.8 g/dL (ref 30.0–36.0)
MCV: 88 fL (ref 78.0–100.0)
Platelets: 64 10*3/uL — ABNORMAL LOW (ref 150–400)
RBC: 2.49 MIL/uL — AB (ref 4.22–5.81)
RDW: 21 % — ABNORMAL HIGH (ref 11.5–15.5)
WBC: 7.3 10*3/uL (ref 4.0–10.5)

## 2014-12-14 LAB — BLOOD GAS, ARTERIAL
ACID-BASE DEFICIT: 3.8 mmol/L — AB (ref 0.0–2.0)
BICARBONATE: 19.1 meq/L — AB (ref 20.0–24.0)
Drawn by: 441371
FIO2: 0.4 %
LHR: 16 {breaths}/min
MECHVT: 600 mL
O2 SAT: 99.8 %
PEEP: 5 cmH2O
Patient temperature: 98.6
TCO2: 19.9 mmol/L (ref 0–100)
pCO2 arterial: 25.8 mmHg — ABNORMAL LOW (ref 35.0–45.0)
pH, Arterial: 7.484 — ABNORMAL HIGH (ref 7.350–7.450)
pO2, Arterial: 183 mmHg — ABNORMAL HIGH (ref 80.0–100.0)

## 2014-12-14 LAB — URINALYSIS, ROUTINE W REFLEX MICROSCOPIC
Bilirubin Urine: NEGATIVE
Bilirubin Urine: NEGATIVE
GLUCOSE, UA: NEGATIVE mg/dL
Glucose, UA: NEGATIVE mg/dL
HGB URINE DIPSTICK: NEGATIVE
KETONES UR: 15 mg/dL — AB
Ketones, ur: NEGATIVE mg/dL
LEUKOCYTES UA: NEGATIVE
Nitrite: NEGATIVE
Nitrite: NEGATIVE
PH: 5 (ref 5.0–8.0)
PROTEIN: NEGATIVE mg/dL
Protein, ur: NEGATIVE mg/dL
Specific Gravity, Urine: 1.016 (ref 1.005–1.030)
Specific Gravity, Urine: 1.017 (ref 1.005–1.030)
Urobilinogen, UA: 0.2 mg/dL (ref 0.0–1.0)
Urobilinogen, UA: 0.2 mg/dL (ref 0.0–1.0)
pH: 5 (ref 5.0–8.0)

## 2014-12-14 LAB — CBC WITH DIFFERENTIAL/PLATELET
Basophils Absolute: 0 10*3/uL (ref 0.0–0.1)
Basophils Relative: 0 % (ref 0–1)
Eosinophils Absolute: 0 10*3/uL (ref 0.0–0.7)
Eosinophils Relative: 0 % (ref 0–5)
HEMATOCRIT: 21.5 % — AB (ref 39.0–52.0)
HEMOGLOBIN: 7.3 g/dL — AB (ref 13.0–17.0)
LYMPHS ABS: 0.5 10*3/uL — AB (ref 0.7–4.0)
Lymphocytes Relative: 8 % — ABNORMAL LOW (ref 12–46)
MCH: 31.7 pg (ref 26.0–34.0)
MCHC: 34 g/dL (ref 30.0–36.0)
MCV: 93.5 fL (ref 78.0–100.0)
MONO ABS: 0.4 10*3/uL (ref 0.1–1.0)
MONOS PCT: 7 % (ref 3–12)
Neutro Abs: 5 10*3/uL (ref 1.7–7.7)
Neutrophils Relative %: 85 % — ABNORMAL HIGH (ref 43–77)
Platelets: 75 10*3/uL — ABNORMAL LOW (ref 150–400)
RBC: 2.3 MIL/uL — AB (ref 4.22–5.81)
RDW: 16.5 % — ABNORMAL HIGH (ref 11.5–15.5)
WBC: 5.8 10*3/uL (ref 4.0–10.5)

## 2014-12-14 LAB — I-STAT CG4 LACTIC ACID, ED
LACTIC ACID, VENOUS: 3.02 mmol/L — AB (ref 0.5–2.0)
Lactic Acid, Venous: 2.93 mmol/L (ref 0.5–2.0)

## 2014-12-14 LAB — RAPID URINE DRUG SCREEN, HOSP PERFORMED
AMPHETAMINES: NOT DETECTED
Barbiturates: NOT DETECTED
Benzodiazepines: POSITIVE — AB
COCAINE: NOT DETECTED
OPIATES: NOT DETECTED
TETRAHYDROCANNABINOL: NOT DETECTED

## 2014-12-14 LAB — BASIC METABOLIC PANEL
Anion gap: 8 (ref 5–15)
BUN: 43 mg/dL — AB (ref 6–20)
CHLORIDE: 105 mmol/L (ref 101–111)
CO2: 20 mmol/L — ABNORMAL LOW (ref 22–32)
Calcium: 7.7 mg/dL — ABNORMAL LOW (ref 8.9–10.3)
Creatinine, Ser: 2.13 mg/dL — ABNORMAL HIGH (ref 0.61–1.24)
GFR calc Af Amer: 38 mL/min — ABNORMAL LOW (ref >60)
GFR calc non Af Amer: 32 mL/min — ABNORMAL LOW (ref >60)
GLUCOSE: 142 mg/dL — AB (ref 65–99)
POTASSIUM: 4.9 mmol/L (ref 3.5–5.1)
SODIUM: 133 mmol/L — AB (ref 135–145)

## 2014-12-14 LAB — PREPARE RBC (CROSSMATCH)

## 2014-12-14 LAB — AMYLASE: AMYLASE: 49 U/L (ref 28–100)

## 2014-12-14 LAB — I-STAT CHEM 8, ED
BUN: 40 mg/dL — AB (ref 6–20)
Calcium, Ion: 1.02 mmol/L — ABNORMAL LOW (ref 1.12–1.23)
Chloride: 102 mmol/L (ref 101–111)
Creatinine, Ser: 2.2 mg/dL — ABNORMAL HIGH (ref 0.61–1.24)
GLUCOSE: 121 mg/dL — AB (ref 65–99)
HCT: 22 % — ABNORMAL LOW (ref 39.0–52.0)
Hemoglobin: 7.5 g/dL — ABNORMAL LOW (ref 13.0–17.0)
Potassium: 5.1 mmol/L (ref 3.5–5.1)
SODIUM: 132 mmol/L — AB (ref 135–145)
TCO2: 18 mmol/L (ref 0–100)

## 2014-12-14 LAB — HEMOGLOBIN AND HEMATOCRIT, BLOOD
HCT: 20.8 % — ABNORMAL LOW (ref 39.0–52.0)
Hemoglobin: 7.1 g/dL — ABNORMAL LOW (ref 13.0–17.0)

## 2014-12-14 LAB — ETHANOL: Alcohol, Ethyl (B): <5 mg/dL (ref <5)

## 2014-12-14 LAB — TROPONIN I
Troponin I: <0.03 ng/mL (ref <0.031)
Troponin I: <0.03 ng/mL (ref <0.031)

## 2014-12-14 LAB — CK: CK TOTAL: 151 U/L (ref 49–397)

## 2014-12-14 LAB — BODY FLUID CELL COUNT WITH DIFFERENTIAL
LYMPHS FL: 34 %
Monocyte-Macrophage-Serous Fluid: 53 % (ref 50–90)
NEUTROPHIL FLUID: 13 % (ref 0–25)
Total Nucleated Cell Count, Fluid: 58 cu mm (ref 0–1000)

## 2014-12-14 LAB — GRAM STAIN

## 2014-12-14 LAB — URINE MICROSCOPIC-ADD ON

## 2014-12-14 LAB — AMMONIA: Ammonia: 252 umol/L — ABNORMAL HIGH (ref 9–35)

## 2014-12-14 LAB — ALBUMIN, FLUID (OTHER)

## 2014-12-14 LAB — GLUCOSE, CAPILLARY
GLUCOSE-CAPILLARY: 136 mg/dL — AB (ref 65–99)
Glucose-Capillary: 139 mg/dL — ABNORMAL HIGH (ref 65–99)
Glucose-Capillary: 126 mg/dL — ABNORMAL HIGH (ref 65–99)

## 2014-12-14 LAB — MRSA PCR SCREENING: MRSA BY PCR: NEGATIVE

## 2014-12-14 LAB — LIPASE, BLOOD: LIPASE: 32 U/L (ref 22–51)

## 2014-12-14 LAB — I-STAT TROPONIN, ED: Troponin i, poc: 0.01 ng/mL (ref 0.00–0.08)

## 2014-12-14 LAB — PROTIME-INR
INR: 1.67 — ABNORMAL HIGH (ref 0.00–1.49)
Prothrombin Time: 19.7 seconds — ABNORMAL HIGH (ref 11.6–15.2)

## 2014-12-14 MED ORDER — FENTANYL CITRATE (PF) 100 MCG/2ML IJ SOLN: 100.0000 ug | INTRAMUSCULAR | Status: DC | PRN

## 2014-12-14 MED ORDER — CHLORHEXIDINE GLUCONATE 0.12 % MT SOLN
15.0000 mL | Freq: Two times a day (BID) | OROMUCOSAL | Status: DC
  Administered 2014-12-14 – 2014-12-19 (×11): 15 mL via OROMUCOSAL
  Filled 2014-12-14 (×9): qty 15

## 2014-12-14 MED ORDER — PANTOPRAZOLE SODIUM 40 MG IV SOLR: 40.0000 mg | Freq: Two times a day (BID) | INTRAVENOUS | Status: DC

## 2014-12-14 MED ORDER — SODIUM CHLORIDE 0.9 % IV SOLN
50.0000 ug/h | INTRAVENOUS | Status: DC
  Administered 2014-12-14 – 2014-12-15 (×3): 50 ug/h via INTRAVENOUS
  Filled 2014-12-14 (×6): qty 1

## 2014-12-14 MED ORDER — MIDAZOLAM HCL 2 MG/2ML IJ SOLN: 2.0000 mg | INTRAMUSCULAR | Status: DC | PRN

## 2014-12-14 MED ORDER — SPIRONOLACTONE 50 MG PO TABS
50.0000 mg | ORAL_TABLET | Freq: Every day | ORAL | Status: DC
  Filled 2014-12-14 (×2): qty 1

## 2014-12-14 MED ORDER — ASPIRIN 300 MG RE SUPP: 300.0000 mg | RECTAL | Status: AC

## 2014-12-14 MED ORDER — PROPOFOL 1000 MG/100ML IV EMUL
5.0000 ug/kg/min | INTRAVENOUS | Status: DC
  Administered 2014-12-14: 10 ug/kg/min via INTRAVENOUS
  Filled 2014-12-14: qty 100

## 2014-12-14 MED ORDER — PANTOPRAZOLE SODIUM 40 MG IV SOLR: 40.0000 mg | Freq: Every day | INTRAVENOUS | Status: DC

## 2014-12-14 MED ORDER — ASPIRIN 81 MG PO CHEW
324.0000 mg | CHEWABLE_TABLET | ORAL | Status: AC
  Administered 2014-12-14: 324 mg via ORAL
  Filled 2014-12-14: qty 4

## 2014-12-14 MED ORDER — SODIUM CHLORIDE 0.9 % IV SOLN
Freq: Once | INTRAVENOUS | Status: AC
  Administered 2014-12-14: 11:00:00 via INTRAVENOUS

## 2014-12-14 MED ORDER — FUROSEMIDE 40 MG PO TABS
40.0000 mg | ORAL_TABLET | Freq: Every day | ORAL | Status: DC
  Administered 2014-12-15: 40 mg
  Filled 2014-12-14 (×2): qty 1

## 2014-12-14 MED ORDER — PANTOPRAZOLE SODIUM 40 MG IV SOLR
40.0000 mg | Freq: Two times a day (BID) | INTRAVENOUS | Status: DC
  Administered 2014-12-14 – 2014-12-15 (×2): 40 mg via INTRAVENOUS
  Filled 2014-12-14 (×6): qty 40

## 2014-12-14 MED ORDER — DEXTROSE 5 % IV SOLN
1.0000 g | Freq: Two times a day (BID) | INTRAVENOUS | Status: DC
  Administered 2014-12-14: 1 g via INTRAVENOUS
  Filled 2014-12-14: qty 1

## 2014-12-14 MED ORDER — SODIUM CHLORIDE 0.9 % IV SOLN
INTRAVENOUS | Status: DC
  Administered 2014-12-14 – 2014-12-15 (×2): via INTRAVENOUS

## 2014-12-14 MED ORDER — INSULIN ASPART 100 UNIT/ML ~~LOC~~ SOLN
2.0000 [IU] | SUBCUTANEOUS | Status: DC
  Administered 2014-12-14 – 2014-12-17 (×12): 2 [IU] via SUBCUTANEOUS
  Administered 2014-12-17: 4 [IU] via SUBCUTANEOUS
  Administered 2014-12-18: 2 [IU] via SUBCUTANEOUS

## 2014-12-14 MED ORDER — ETOMIDATE 2 MG/ML IV SOLN
20.0000 mg/kg | Freq: Once | INTRAVENOUS | Status: DC
  Filled 2014-12-14: qty 998

## 2014-12-14 MED ORDER — RIFAXIMIN 550 MG PO TABS
550.0000 mg | ORAL_TABLET | Freq: Two times a day (BID) | ORAL | Status: DC
Start: 2014-12-14 — End: 2014-12-20
  Administered 2014-12-14 – 2014-12-20 (×12): 550 mg
  Filled 2014-12-14 (×14): qty 1

## 2014-12-14 MED ORDER — PROPOFOL 10 MG/ML IV BOLUS: 0.5000 mg/kg | Freq: Once | INTRAVENOUS | Status: DC

## 2014-12-14 MED ORDER — FENTANYL CITRATE (PF) 100 MCG/2ML IJ SOLN
100.0000 ug | INTRAMUSCULAR | Status: DC | PRN
  Administered 2014-12-16: 100 ug via INTRAVENOUS
  Filled 2014-12-14 (×2): qty 2

## 2014-12-14 MED ORDER — ETOMIDATE 2 MG/ML IV SOLN
20.0000 mg | Freq: Once | INTRAVENOUS | Status: AC
  Administered 2014-12-14: 20 mg via INTRAVENOUS

## 2014-12-14 MED ORDER — SUCCINYLCHOLINE CHLORIDE 20 MG/ML IJ SOLN
120.0000 mg | Freq: Once | INTRAMUSCULAR | Status: AC
  Administered 2014-12-14: 120 mg via INTRAVENOUS
  Filled 2014-12-14: qty 6

## 2014-12-14 MED ORDER — OCTREOTIDE LOAD VIA INFUSION
50.0000 ug | Freq: Once | INTRAVENOUS | Status: AC
  Administered 2014-12-14: 50 ug via INTRAVENOUS
  Filled 2014-12-14: qty 25

## 2014-12-14 MED ORDER — LACTULOSE 10 GM/15ML PO SOLN
30.0000 g | Freq: Three times a day (TID) | ORAL | Status: DC
  Administered 2014-12-14 – 2014-12-19 (×14): 30 g
  Filled 2014-12-14 (×16): qty 45

## 2014-12-14 MED ORDER — DEXTROSE 5 % IV SOLN
2.0000 g | Freq: Two times a day (BID) | INTRAVENOUS | Status: DC
  Administered 2014-12-15 – 2014-12-16 (×3): 2 g via INTRAVENOUS
  Filled 2014-12-14 (×4): qty 2

## 2014-12-14 MED ORDER — CETYLPYRIDINIUM CHLORIDE 0.05 % MT LIQD
7.0000 mL | Freq: Four times a day (QID) | OROMUCOSAL | Status: DC
  Administered 2014-12-14 – 2014-12-20 (×20): 7 mL via OROMUCOSAL

## 2014-12-14 MED ORDER — LACTULOSE 10 GM/15ML PO SOLN
60.0000 g | Freq: Once | ORAL | Status: AC
  Administered 2014-12-14: 60 g
  Filled 2014-12-14: qty 90

## 2014-12-14 MED ORDER — VITAMIN K1 10 MG/ML IJ SOLN
10.0000 mg | Freq: Once | INTRAVENOUS | Status: AC
  Administered 2014-12-14: 10 mg via INTRAVENOUS
  Filled 2014-12-14: qty 1

## 2014-12-14 MED ORDER — SODIUM CHLORIDE 0.9 % IV SOLN
Freq: Once | INTRAVENOUS | Status: AC
  Administered 2014-12-15: 18:00:00 via INTRAVENOUS

## 2014-12-14 MED ORDER — ALBUTEROL SULFATE (2.5 MG/3ML) 0.083% IN NEBU: 2.5000 mg | INHALATION_SOLUTION | RESPIRATORY_TRACT | Status: DC | PRN

## 2014-12-14 NOTE — ED Notes (Signed)
CBG 111 

## 2014-12-14 NOTE — Progress Notes (Signed)
Dr. Lamonte Sakai made aware of bloody emesis, patient connected to low wall intermittent suction. Per MD Orders incoming.

## 2014-12-14 NOTE — ED Notes (Signed)
bair hugger applied.

## 2014-12-14 NOTE — ED Notes (Signed)
Pt remains sedated on ventilator, brown colored drainage from OG tube, vital signs stable at the time.

## 2014-12-14 NOTE — Procedures (Signed)
Paracentesis Procedure Note  Procedure: Diagnostic paracentesis Operator: R Graig Hessling Meds: None Complications: None Apparent  Details:  Procedure was performed urgently due the patient's critical illness. He was obtunded, completely unresponsive so lidocaine was not used. Ultrasound was utilized to identify a large pocket of ascites in the RLQ. Under sterile conditions a 1.5in 18 guage needle was used to obtain 20cc of clear yellow fluid. No bleeding, no apparent complications. Dressing was applied.   Sample:  20cc clear yellow ascitic fluid for cell count, cx, albumin.    Baltazar Apo, MD, PhD 12/14/2014, 3:09 PM [facility] [phone] or if no answer [phone]

## 2014-12-14 NOTE — ED Notes (Signed)
Attempted to call report. Nurse unable to take at the time, to call back.

## 2014-12-14 NOTE — ED Notes (Signed)
Dr. Wyvonnia Dusky at bedside preparing to intubate.

## 2014-12-14 NOTE — ED Notes (Signed)
Pt remains sedated on ventilator. Vital signs stable. Awaiting bed placement.

## 2014-12-14 NOTE — ED Notes (Signed)
c collar placed

## 2014-12-14 NOTE — Progress Notes (Signed)
eLink Physician-Brief Progress Note Patient Name: [name] DOB: 1955/08/24 MRN: [id number]   Date of Service  12/14/2014  HPI/Events of Note  59 yr old cirrhotic male admitted with mental status change secondary to encephalopathy.  Patient with known esoph varices and portal gastropathy.  He is having hematemesis.  HGB is 7.1.  BP is low.  Patient on vent.  eICU Interventions  Continue octreotide and protonix Transfuse 1 unit PRBC Consult GI in event bleeding continues and EGD needed     Intervention Category Intermediate Interventions: Bleeding - evaluation and treatment with blood products  Mauri Brooklyn, P 12/14/2014, 5:36 PM

## 2014-12-14 NOTE — ED Notes (Addendum)
Pt found unresponsive laying on floor beside of bed, pt covered in vomit. Unresponsive upon arrival to ED.  20g L forearm. Received 5mg  Versed per [facility]. R sided gaze at present. History of alcoholism.

## 2014-12-14 NOTE — ED Provider Notes (Signed)
CSN: [id number]     Arrival date & time 12/14/14  1601 History   First MD Initiated Contact with Patient 12/14/14 [phone]     Chief Complaint  Patient presents with  . Altered Mental Status     (Consider location/radiation/quality/duration/timing/severity/associated sxs/prior Treatment) HPI Comments: Patient brought in by EMS. Found unresponsive lying next to his bed this morning. Last seen normal last night. Found to have vomit surrounding him. EMS gave 5 mg of Versed for questionable seizure and right-sided gaze. He is unresponsive on arrival which a clenched jaw. No evidence of trauma other than somewhat abrasions and lacerations to left forearm. Patient has history of ascites and cirrhosis as well as recent GI bleed.  Patient is a 59 y.o. male presenting with altered mental status. The history is provided by the EMS personnel. The history is limited by the condition of the patient.  Altered Mental Status   Past Medical History  Diagnosis Date  . Neuropathy   . Diabetes mellitus   . Bipolar affect, depressed   . Hypertension   . Arthritis   . Stroke     Mini stroke about 35yrs ago  . Cirrhosis   . Alcohol abuse   . Chronic pain   . Cocaine abuse   . Muscle spasm     both legs  . Encephalopathy, hepatic   . Detached retina   . COPD (chronic obstructive pulmonary disease)     emphysema  . Bronchitis   . Barrett's esophagus   . GERD (gastroesophageal reflux disease)     has ulcer  . Anemia    Past Surgical History  Procedure Laterality Date  . Fracture surgery      Leg and arm 70yrs ago  . Esophagogastroduodenoscopy  04/04/2012    Procedure: ESOPHAGOGASTRODUODENOSCOPY (EGD);  Surgeon: Irene Shipper, MD;  Location: [facility] ENDOSCOPY;  Service: Endoscopy;  Laterality: N/A;  . Esophagogastroduodenoscopy Left 03/13/2013    Procedure: ESOPHAGOGASTRODUODENOSCOPY (EGD);  Surgeon: Arta Silence, MD;  Location: [facility] ENDOSCOPY;  Service: Endoscopy;  Laterality: Left;  Marland Kitchen Eye surgery  8  months ago both eyes    cataracts both eyes, detached eye, gas pocket  . Vasectomy    . Pars plana vitrectomy Left 07/08/2013    Procedure: PARS PLANA VITRECTOMY WITH 25 GAUGE;  Surgeon: Hurman Horn, MD;  Location: [place];  Service: Ophthalmology;  Laterality: Left;  . Intraocular lens removal Left 07/08/2013    Procedure: REMOVAL OF INTRAOCULAR LENS;  Surgeon: Hurman Horn, MD;  Location: [place];  Service: Ophthalmology;  Laterality: Left;  . Placement and suture of secondary intraocular lens Left 07/08/2013    Procedure: PLACEMENT AND SUTURE OF SECONDARY INTRAOCULAR LENS;  Surgeon: Hurman Horn, MD;  Location: [place];  Service: Ophthalmology;  Laterality: Left;  Insertion of Anterior Capsule Intraocular Lens   . Esophagogastroduodenoscopy N/A 01/16/2014    Procedure: ESOPHAGOGASTRODUODENOSCOPY (EGD);  Surgeon: Winfield Cunas., MD;  Location: [facility] ENDOSCOPY;  Service: Endoscopy;  Laterality: N/A;  . Colonoscopy N/A 01/17/2014    Procedure: COLONOSCOPY;  Surgeon: Winfield Cunas., MD;  Location: [facility] ENDOSCOPY;  Service: Endoscopy;  Laterality: N/A;  possible banding  . Esophagogastroduodenoscopy N/A 08/30/2014    Procedure: ESOPHAGOGASTRODUODENOSCOPY (EGD);  Surgeon: Wilford Corner, MD;  Location: [facility] ENDOSCOPY;  Service: Endoscopy;  Laterality: N/A;  bedside  . Esophagogastroduodenoscopy N/A 11/30/2014    Procedure: ESOPHAGOGASTRODUODENOSCOPY (EGD);  Surgeon: Wilford Corner, MD;  Location[facility] ENDOSCOPY;  Service: Endoscopy;  Laterality: N/A;  Family History  Problem Relation Age of Onset  . Hypotension Mother    History  Substance Use Topics  . Smoking status: Current Every Day Smoker -- 1.00 packs/day for 30 years    Types: Cigarettes  . Smokeless tobacco: Never Used  . Alcohol Use: 0.0 oz/week     Comment: 12 pk beer daily  06/2013 - no alcohol since 11/2012    Review of Systems  Unable to perform ROS     Allergies  Review of patient's allergies indicates no known  allergies.  Home Medications   Prior to Admission medications   Medication Sig Start Date End Date Taking? Authorizing Provider  amitriptyline (ELAVIL) 25 MG tablet Take 1 tablet (25 mg total) by mouth at bedtime. 10/05/14   Nita Sells, MD  CALCIUM-VITAMIN D PO Take 100 Units by mouth daily.    Historical Provider, MD  clonazePAM (KLONOPIN) 0.25 MG disintegrating tablet Take 0.25 mg by mouth 2 (two) times daily.  10/17/14   Historical Provider, MD  diazepam (VALIUM) 5 MG tablet Take 5 mg by mouth 2 (two) times daily. 11/15/14   Historical Provider, MD  diclofenac sodium (VOLTAREN) 1 % GEL Apply 4 g topically as needed (for back of leg pain).    Historical Provider, MD  feeding supplement, ENSURE ENLIVE, (ENSURE ENLIVE) LIQD Take 237 mLs by mouth 2 (two) times daily between meals. 11/13/14   Maryann Mikhail, DO  folic acid (FOLVITE) 1 MG tablet Take 1 tablet (1 mg total) by mouth daily. 10/05/14   Nita Sells, MD  furosemide (LASIX) 40 MG tablet Take 40 mg by mouth daily.  10/25/14   Historical Provider, MD  lactulose (CHRONULAC) 10 GM/15ML solution Take 45 mLs (30 g total) by mouth 3 (three) times daily. 12/03/14   Annita Brod, MD  LANTUS SOLOSTAR 100 UNIT/ML Solostar Pen Inject 45 Units into the skin daily at 10 pm.  11/07/14   Historical Provider, MD  magnesium oxide (MAG-OX) 400 (241.3 MG) MG tablet Take 1 tablet (400 mg total) by mouth 2 (two) times daily. 09/03/14   Reyne Dumas, MD  Multiple Vitamin (MULTIVITAMIN WITH MINERALS) TABS tablet Take 1 tablet by mouth daily. 07/31/14   Geradine Girt, DO  NOVOLOG FLEXPEN 100 UNIT/ML FlexPen Inject 6 Units into the skin 3 (three) times daily with meals. 11/24/14   Historical Provider, MD  Oxycodone HCl 10 MG TABS Take 10 mg by mouth 3 (three) times daily. 11/14/14   Historical Provider, MD  pantoprazole (PROTONIX) 40 MG tablet Take 1 tablet (40 mg total) by mouth daily. 12/03/14   Annita Brod, MD  propranolol (INDERAL) 20 MG tablet  Take 20 mg by mouth 2 (two) times daily. 09/24/14   Historical Provider, MD  spironolactone (ALDACTONE) 50 MG tablet Take 1 tablet (50 mg total) by mouth daily. 12/03/14   Annita Brod, MD  thiamine (VITAMIN B-1) 100 MG tablet Take 100 mg by mouth daily.    Historical Provider, MD  XIFAXAN 550 MG TABS tablet Take 550 mg by mouth 2 (two) times daily.  10/29/14   Historical Provider, MD   BP 122/73 mmHg  Pulse 88  Temp(Src) 95.6 F (35.3 C) (Rectal)  Resp 16  SpO2 100% Physical Exam  Constitutional: He appears well-developed and well-nourished. He appears distressed.  Unresponsive, GCS 3  HENT:  Head: Normocephalic and atraumatic.  Eyes:  Right-sided gaze, pupils 3 mm and minimally reactive  Neck: Normal range of motion. Neck supple.  C-collar placed  on arrival  Cardiovascular: Normal rate, regular rhythm and normal heart sounds.   Pulmonary/Chest: Effort normal and breath sounds normal.  Shallow spontaneous respirations  Abdominal: He exhibits distension. There is tenderness.  Diffusely distended abdomen with ascites  Musculoskeletal:  Abrasions and superficial lacerations to left forearm  Neurological:  Unresponsive, GCS 3    ED Course  INTUBATION Date/Time: 12/14/2014 9:11 AM Performed by: Ezequiel Essex Authorized by: Ezequiel Essex Consent: The procedure was performed in an emergent situation. Risks and benefits: risks, benefits and alternatives were discussed Consent given by: patient Time out: Immediately prior to procedure a "time out" was called to verify the correct patient, procedure, equipment, support staff and site/side marked as required. Indications: respiratory failure and  airway protection Intubation method: video-assisted Patient status: paralyzed (RSI) Preoxygenation: nonrebreather mask and BVM Sedatives: etomidate Paralytic: succinylcholine Laryngoscope size: Mac 4 Tube size: 8.0 mm Tube type: cuffed Number of attempts: 1 Ventilation between  attempts: BVM Cricoid pressure: no Cords visualized: yes Post-procedure assessment: chest rise Breath sounds: equal Cuff inflated: yes ETT to lip: 23 cm Tube secured with: ETT holder Chest x-ray interpreted by me and radiologist. Chest x-ray findings: endotracheal tube in appropriate position Patient tolerance: Patient tolerated the procedure well with no immediate complications   (including critical care time) Labs Review Labs Reviewed  CBC WITH DIFFERENTIAL/PLATELET - Abnormal; Notable for the following:    RBC 2.30 (*)    Hemoglobin 7.3 (*)    HCT 21.5 (*)    RDW 16.5 (*)    Neutrophils Relative % 85 (*)    Lymphocytes Relative 8 (*)    Lymphs Abs 0.5 (*)    All other components within normal limits  COMPREHENSIVE METABOLIC PANEL - Abnormal; Notable for the following:    Sodium 133 (*)    Potassium 5.2 (*)    CO2 21 (*)    Glucose, Bld 128 (*)    BUN 41 (*)    Creatinine, Ser 2.16 (*)    Calcium 7.9 (*)    Total Protein 5.9 (*)    Albumin 2.2 (*)    AST 84 (*)    Total Bilirubin 2.2 (*)    GFR calc non Af Amer 32 (*)    GFR calc Af Amer 37 (*)    All other components within normal limits  AMMONIA - Abnormal; Notable for the following:    Ammonia 252 (*)    All other components within normal limits  URINALYSIS, ROUTINE W REFLEX MICROSCOPIC (NOT AT [facility]) - Abnormal; Notable for the following:    Color, Urine AMBER (*)    All other components within normal limits  I-STAT ARTERIAL BLOOD GAS, ED - Abnormal; Notable for the following:    pCO2 arterial 32.7 (*)    pO2, Arterial 405.0 (*)    All other components within normal limits  I-STAT CG4 LACTIC ACID, ED - Abnormal; Notable for the following:    Lactic Acid, Venous 2.93 (*)    All other components within normal limits  I-STAT CHEM 8, ED - Abnormal; Notable for the following:    Sodium 132 (*)    BUN 40 (*)    Creatinine, Ser 2.20 (*)    Glucose, Bld 121 (*)    Calcium, Ion 1.02 (*)    Hemoglobin 7.5 (*)     HCT 22.0 (*)    All other components within normal limits  URINE CULTURE  TROPONIN I  PROTIME-INR  CK  I-STAT TROPOININ, ED  CBG MONITORING,  ED    Imaging Review Ct Head Wo Contrast  12/14/2014   CLINICAL DATA:  Patient found unresponsive today. Initial encounter.  EXAM: CT HEAD WITHOUT CONTRAST  CT CERVICAL SPINE WITHOUT CONTRAST  TECHNIQUE: Multidetector CT imaging of the head and cervical spine was performed following the standard protocol without intravenous contrast. Multiplanar CT image reconstructions of the cervical spine were also generated.  COMPARISON:  Head CT scan 11/10/2014. Head and cervical spine CT scans 08/29/2014.  FINDINGS: CT HEAD FINDINGS  There is advanced for age appearing cortical atrophy. There is also some chronic microvascular ischemic change. No evidence of acute abnormality including hemorrhage, infarct, mass lesion, mass effect, midline shift or abnormal extra-axial fluid collection is identified. The calvarium is intact. Imaged paranasal sinuses and mastoid air cells are clear.  CT CERVICAL SPINE FINDINGS  There is no fracture or malalignment of the cervical spine. Endotracheal tube and OG tube are in place. Intervertebral disc space height is maintained. Lung apices are clear.  IMPRESSION: No acute abnormality head or cervical spine.  Atrophy and chronic microvascular ischemic change.   Electronically Signed   By: Inge Rise M.D.   On: 12/14/2014 10:02   Ct Cervical Spine Wo Contrast  12/14/2014   CLINICAL DATA:  Patient found unresponsive today. Initial encounter.  EXAM: CT HEAD WITHOUT CONTRAST  CT CERVICAL SPINE WITHOUT CONTRAST  TECHNIQUE: Multidetector CT imaging of the head and cervical spine was performed following the standard protocol without intravenous contrast. Multiplanar CT image reconstructions of the cervical spine were also generated.  COMPARISON:  Head CT scan 11/10/2014. Head and cervical spine CT scans 08/29/2014.  FINDINGS: CT HEAD FINDINGS   There is advanced for age appearing cortical atrophy. There is also some chronic microvascular ischemic change. No evidence of acute abnormality including hemorrhage, infarct, mass lesion, mass effect, midline shift or abnormal extra-axial fluid collection is identified. The calvarium is intact. Imaged paranasal sinuses and mastoid air cells are clear.  CT CERVICAL SPINE FINDINGS  There is no fracture or malalignment of the cervical spine. Endotracheal tube and OG tube are in place. Intervertebral disc space height is maintained. Lung apices are clear.  IMPRESSION: No acute abnormality head or cervical spine.  Atrophy and chronic microvascular ischemic change.   Electronically Signed   By: Inge Rise M.D.   On: 12/14/2014 10:02   Dg Chest Portable 1 View  12/14/2014   CLINICAL DATA:  Patient unresponsive.  Status post intubation.  EXAM: PORTABLE CHEST - 1 VIEW  COMPARISON:  PA and lateral chest 12/09/2014.  FINDINGS: Endotracheal tube is in place with the tip approximately 0.5 cm above the carina. Recommend withdrawal of 1.5-2 cm. NG tube courses into the stomach and below the inferior margin of film. Lung volumes are low with basilar atelectasis. Heart size is normal.  IMPRESSION: ETT tip projects 0.5 cm above the carina. Recommend withdrawal of 1.5-2 cm.  Basilar atelectasis in a low volume chest.  Critical Value/emergent results were called by telephone at the time of interpretation on 12/14/2014 at 9:31 am to Dr. Ezequiel Essex , who verbally acknowledged these results.   Electronically Signed   By: Inge Rise M.D.   On: 12/14/2014 09:32     EKG Interpretation   Date/Time:  Sunday December 14 2014 09:06:31 EDT Ventricular Rate:  89 PR Interval:  187 QRS Duration: 90 QT Interval:  396 QTC Calculation: 482 R Axis:   8 Text Interpretation:  Sinus rhythm Abnormal R-wave progression, early  transition Borderline  prolonged QT interval No significant change was  found Confirmed by Wyvonnia Dusky  MD,  Kendall Arnell [phone]) on 12/14/2014 9:13:47 AM      MDM   Final diagnoses:  Acute respiratory failure, unspecified whether with hypoxia or hypercapnia  Hepatic encephalopathy   Patient found unresponsive with right-sided gaze. Questionable seizure activity. Intubated on arrival.   vomit suctioned from airway.  CT head is negative for hemorrhage or other acute pathology. EKG shows no signs of hyperkalemia.  Labs remarkable for elevated ammonia of 252. Patient has stable anemia. Acute renal failure noted. Stable thrombcytopenia  Patient given IV fluids, lactulose through NG tube. Hx ascites, cirrhosis, esophageal varices, recent admit for GI bleeding and had EGD.  BP stable in the ED.  Propofol added for sedation. Suspect hepatic encephalopathy.  No clear evidence of aspiration or sepsis.  D/w Dr. Lamonte Sakai of PCCM who will admit.  CRITICAL CARE Performed by: Ezequiel Essex Total critical care time: 60 Critical care time was exclusive of separately billable procedures and treating other patients. Critical care was necessary to treat or prevent imminent or life-threatening deterioration. Critical care was time spent personally by me on the following activities: development of treatment plan with patient and/or surrogate as well as nursing, discussions with consultants, evaluation of patient's response to treatment, examination of patient, obtaining history from patient or surrogate, ordering and performing treatments and interventions, ordering and review of laboratory studies, ordering and review of radiographic studies, pulse oximetry and re-evaluation of patient's condition.    Ezequiel Essex, MD 12/14/14 [phone]

## 2014-12-14 NOTE — ED Notes (Signed)
CCM at bedside 

## 2014-12-14 NOTE — Progress Notes (Signed)
ETT pulled back 2cm per chest xray. ETT secured 21 at lip.  bbs heard on auscultation

## 2014-12-14 NOTE — ED Notes (Signed)
RSI successful, 8.0 ET tube, 23 @ lip, positive color change, bilateral equal breath sounds.

## 2014-12-14 NOTE — H&P (Signed)
PULMONARY / CRITICAL CARE MEDICINE   Name: [name] MRN: [id number] DOB: [birth year]    ADMISSION DATE:  12/14/2014  REFERRING MD :  EDP   CHIEF COMPLAINT:    INITIAL PRESENTATION: 59 yo male with ETOH cirrhosis , portal HTN with varices and ascites found unresponsive at home covered in vomit. Intubated in ER. CCM to admit   STUDIES:  CT head and C spine >chronic change, no acute   SIGNIFICANT EVENTS: 7/24 found unresponsive at home >ER    HISTORY OF PRESENT ILLNESS:   [name] is a 59 y.o. male with a past medical history of alcoholic cirrhosis, ascites, history recurrent hospitalizations for hepatic encephalopathy, diastolic congestive heart failure, type 2 diabetes mellitus, who was recently discharged from the medicine service on 12/05/14 for GI bleed with varices . Had endo with spontaneous resolution. Tx w/ Ocetetride.. Also had paracentesis with 5L removed.   Pt was found at home by family unresponsive in floor covered in vomit.  EMS brought to ER , he was intubated with decreased responsiveness.  Ammonia 252 on arrival . Started on Lactulose.  Has hx of ETOH abuse, cocaine use.  He is unresponsive on vent. No family present.  He is sedated on Diprivan . Grimmaces to pain .  Will admit to ICU      PAST MEDICAL HISTORY :   has a past medical history of Neuropathy; Diabetes mellitus; Bipolar affect, depressed; Hypertension; Arthritis; Stroke; Cirrhosis; Alcohol abuse; Chronic pain; Cocaine abuse; Muscle spasm; Encephalopathy, hepatic; Detached retina; COPD (chronic obstructive pulmonary disease); Bronchitis; Barrett's esophagus; GERD (gastroesophageal reflux disease); and Anemia.  has past surgical history that includes Fracture surgery; Esophagogastroduodenoscopy (04/04/2012); Esophagogastroduodenoscopy (Left, 03/13/2013); Eye surgery (8 months ago both eyes);  Vasectomy; Pars plana vitrectomy (Left, 07/08/2013); Intraocular lens removal (Left, 07/08/2013); Placement and suture of secondary intraoculaer lens (Left, 07/08/2013); Esophagogastroduodenoscopy (N/A, 01/16/2014); Colonoscopy (N/A, 01/17/2014); Esophagogastroduodenoscopy (N/A, 08/30/2014); and Esophagogastroduodenoscopy (N/A, 11/30/2014). Prior to Admission medications   Medication Sig Start Date End Date Taking? Authorizing Provider  amitriptyline (ELAVIL) 25 MG tablet Take 1 tablet (25 mg total) by mouth at bedtime. 10/05/14   Nita Sells, MD  CALCIUM-VITAMIN D PO Take 100 Units by mouth daily.    Historical Provider, MD  clonazePAM (KLONOPIN) 0.25 MG disintegrating tablet Take 0.25 mg by mouth 2 (two) times daily.  10/17/14   Historical Provider, MD  diazepam (VALIUM) 5 MG tablet Take 5 mg by mouth 2 (two) times daily. 11/15/14   Historical Provider, MD  diclofenac sodium (VOLTAREN) 1 % GEL Apply 4 g topically as needed (for back of leg pain).    Historical Provider, MD  feeding supplement, ENSURE ENLIVE, (ENSURE ENLIVE) LIQD Take 237 mLs by mouth 2 (two) times daily between meals. 11/13/14   Maryann Mikhail, DO  folic acid (FOLVITE) 1 MG tablet Take 1 tablet (1 mg total) by mouth daily. 10/05/14   Nita Sells, MD  furosemide (LASIX) 40 MG tablet Take 40 mg by mouth daily.  10/25/14   Historical Provider, MD  lactulose (CHRONULAC) 10 GM/15ML solution Take 45 mLs (30 g total) by mouth 3 (three) times daily. 12/03/14   Annita Brod, MD  LANTUS SOLOSTAR 100 UNIT/ML Solostar Pen Inject 45 Units into the skin daily at 10 pm.  11/07/14   Historical Provider, MD  magnesium oxide (MAG-OX) 400 (241.3 MG) MG tablet Take 1 tablet (400 mg total) by mouth 2 (two) times daily. 09/03/14   Reyne Dumas, MD  Multiple Vitamin (MULTIVITAMIN  WITH MINERALS) TABS tablet Take 1 tablet by mouth daily. 07/31/14   Geradine Girt, DO  NOVOLOG FLEXPEN 100 UNIT/ML FlexPen Inject 6 Units into the skin 3 (three) times daily  with meals. 11/24/14   Historical Provider, MD  Oxycodone HCl 10 MG TABS Take 10 mg by mouth 3 (three) times daily. 11/14/14   Historical Provider, MD  pantoprazole (PROTONIX) 40 MG tablet Take 1 tablet (40 mg total) by mouth daily. 12/03/14   Annita Brod, MD  propranolol (INDERAL) 20 MG tablet Take 20 mg by mouth 2 (two) times daily. 09/24/14   Historical Provider, MD  spironolactone (ALDACTONE) 50 MG tablet Take 1 tablet (50 mg total) by mouth daily. 12/03/14   Annita Brod, MD  thiamine (VITAMIN B-1) 100 MG tablet Take 100 mg by mouth daily.    Historical Provider, MD  XIFAXAN 550 MG TABS tablet Take 550 mg by mouth 2 (two) times daily.  10/29/14   Historical Provider, MD   No Known Allergies  FAMILY HISTORY:  indicated that his mother is alive. He indicated that his father is deceased. He indicated that his maternal grandmother is deceased. He indicated that his maternal grandfather is deceased. He indicated that his paternal grandmother is deceased. He indicated that his paternal grandfather is deceased.  SOCIAL HISTORY:  reports that he has been smoking Cigarettes.  He has a 30 pack-year smoking history. He has never used smokeless tobacco. He reports that he drinks alcohol. He reports that he uses illicit drugs (Cocaine).  REVIEW OF SYSTEMS:  Unable to obtain , sedated on vent   SUBJECTIVE:  Intubated on vent   VITAL SIGNS: Temp:  [95.4 F (35.2 C)-95.6 F (35.3 C)] 95.4 F (35.2 C) (07/24 1101) Pulse Rate:  [71-97] 71 (07/24 1101) Resp:  [16-32] 16 (07/24 1101) BP: (114-149)/(57-73) 114/57 mmHg (07/24 1101) SpO2:  [98 %-100 %] 100 % (07/24 1101) FiO2 (%):  [40 %-100 %] 40 % (07/24 1019) HEMODYNAMICS:   VENTILATOR SETTINGS: Vent Mode:  [-] PRVC FiO2 (%):  [40 %-100 %] 40 % Set Rate:  [16 bmp] 16 bmp Vt Set:  [600 mL] 600 mL PEEP:  [5 cmH20] 5 cmH20 INTAKE / OUTPUT:  Intake/Output Summary (Last 24 hours) at 12/14/14 1125 Last data filed at 12/14/14 1013  Gross per 24  hour  Intake     50 ml  Output      0 ml  Net     50 ml    PHYSICAL EXAMINATION: General:  Sedated on vent  Neuro:  Sedated  HEENT:  ETT , dry mucosa  Cardiovascular:  SR , no m/r/g , 2+ edema  Lungs:  Coarse rhonchi  Abdomen:  Distended, hypoactive BS Musculoskeletal:  Intact , C Collar in place  Skin:  Intact   LABS:  CBC  Recent Labs Lab 12/09/14 1129 12/14/14 0909 12/14/14 0916  WBC 3.7* 5.8  --   HGB 7.6* 7.3* 7.5*  HCT 22.8* 21.5* 22.0*  PLT 68* 75*  --    Coag's  Recent Labs Lab 12/09/14 1129 12/14/14 0909  INR 1.50* 1.67*   BMET  Recent Labs Lab 12/09/14 1129 12/14/14 0909 12/14/14 0916  NA 129* 133* 132*  K 4.8 5.2* 5.1  CL 100* 103 102  CO2 22 21*  --   BUN 38* 41* 40*  CREATININE 1.54* 2.16* 2.20*  GLUCOSE 132* 128* 121*   Electrolytes  Recent Labs Lab 12/09/14 1129 12/14/14 0909  CALCIUM 8.2* 7.9*  Sepsis Markers  Recent Labs Lab 12/14/14 0917  LATICACIDVEN 2.93*   ABG  Recent Labs Lab 12/14/14 1015  PHART 7.447  PCO2ART 32.7*  PO2ART 405.0*   Liver Enzymes  Recent Labs Lab 12/09/14 1129 12/14/14 0909  AST 84* 84*  ALT 40 39  ALKPHOS 114 112  BILITOT 2.4* 2.2*  ALBUMIN 2.3* 2.2*   Cardiac Enzymes  Recent Labs Lab 12/14/14 0909  TROPONINI <0.03   Glucose  Recent Labs Lab 12/14/14 1100  GLUCAP 111*    Imaging Ct Head Wo Contrast  12/14/2014   CLINICAL DATA:  Patient found unresponsive today. Initial encounter.  EXAM: CT HEAD WITHOUT CONTRAST  CT CERVICAL SPINE WITHOUT CONTRAST  TECHNIQUE: Multidetector CT imaging of the head and cervical spine was performed following the standard protocol without intravenous contrast. Multiplanar CT image reconstructions of the cervical spine were also generated.  COMPARISON:  Head CT scan 11/10/2014. Head and cervical spine CT scans 08/29/2014.  FINDINGS: CT HEAD FINDINGS  There is advanced for age appearing cortical atrophy. There is also some chronic microvascular  ischemic change. No evidence of acute abnormality including hemorrhage, infarct, mass lesion, mass effect, midline shift or abnormal extra-axial fluid collection is identified. The calvarium is intact. Imaged paranasal sinuses and mastoid air cells are clear.  CT CERVICAL SPINE FINDINGS  There is no fracture or malalignment of the cervical spine. Endotracheal tube and OG tube are in place. Intervertebral disc space height is maintained. Lung apices are clear.  IMPRESSION: No acute abnormality head or cervical spine.  Atrophy and chronic microvascular ischemic change.   Electronically Signed   By: Inge Rise M.D.   On: 12/14/2014 10:02   Ct Cervical Spine Wo Contrast  12/14/2014   CLINICAL DATA:  Patient found unresponsive today. Initial encounter.  EXAM: CT HEAD WITHOUT CONTRAST  CT CERVICAL SPINE WITHOUT CONTRAST  TECHNIQUE: Multidetector CT imaging of the head and cervical spine was performed following the standard protocol without intravenous contrast. Multiplanar CT image reconstructions of the cervical spine were also generated.  COMPARISON:  Head CT scan 11/10/2014. Head and cervical spine CT scans 08/29/2014.  FINDINGS: CT HEAD FINDINGS  There is advanced for age appearing cortical atrophy. There is also some chronic microvascular ischemic change. No evidence of acute abnormality including hemorrhage, infarct, mass lesion, mass effect, midline shift or abnormal extra-axial fluid collection is identified. The calvarium is intact. Imaged paranasal sinuses and mastoid air cells are clear.  CT CERVICAL SPINE FINDINGS  There is no fracture or malalignment of the cervical spine. Endotracheal tube and OG tube are in place. Intervertebral disc space height is maintained. Lung apices are clear.  IMPRESSION: No acute abnormality head or cervical spine.  Atrophy and chronic microvascular ischemic change.   Electronically Signed   By: Inge Rise M.D.   On: 12/14/2014 10:02   Dg Chest Portable 1  View  12/14/2014   CLINICAL DATA:  Patient unresponsive.  Status post intubation.  EXAM: PORTABLE CHEST - 1 VIEW  COMPARISON:  PA and lateral chest 12/09/2014.  FINDINGS: Endotracheal tube is in place with the tip approximately 0.5 cm above the carina. Recommend withdrawal of 1.5-2 cm. NG tube courses into the stomach and below the inferior margin of film. Lung volumes are low with basilar atelectasis. Heart size is normal.  IMPRESSION: ETT tip projects 0.5 cm above the carina. Recommend withdrawal of 1.5-2 cm.  Basilar atelectasis in a low volume chest.  Critical Value/emergent results were called by telephone at the  time of interpretation on 12/14/2014 at 9:31 am to Dr. Ezequiel Essex , who verbally acknowledged these results.   Electronically Signed   By: Inge Rise M.D.   On: 12/14/2014 09:32     ASSESSMENT / PLAN:  PULMONARY OETT7/24>> A: Acute Respiratory Failure secondary to inability to protect airway  At risk aspiration although no evidence PNA thus far P:   Vent Supoort 8 cc /kg  VAP precautions  BD As needed   Assess for Daily  SBT /weaning  Check abg/cxr  in am    CARDIOVASCULAR  A: Diastolic CHF -echo 01/5187 w/ EF 60%, gr 1 DD  P:  Check bnp  BB on hold for now  Cont lasix /aldactone per tube    RENAL A:  Acute on chronic renal failure (1.47 baseline )   P:   Begin on IVF NS at [place] on albumin for now although will consider given progressive renal dz presumed secondary to hepatic dz.  GASTROINTESTINAL A:  Alcoholic Cirrhosis  Ascites  Hx varices and GIB, no evidence at this time.  P:   Follow LFT  Restart Lacutulose  Check ammonia in am  NPO , add TF if not extubated in 24 hr  Cont Xifaxan per tube  Set up for dx paracentesis , send fluid for cx , cell count and albumin  PPI SUP  Empiric coverage for SBP as per ID section    HEMATOLOGIC A:  Chronic Thrombocytopenia -plt 75 Hypercoaguable w/ chronic liver dz -INR 1.67 Chronic Anemia with  hepatic gastropathy/varices -no sign of active bleeding   P:  Trend cbc  Tr INR  SCD (no hep )  Type and screen  Vitamin K x 1  INFECTIOUS A:  At risk for SBP  P:   BCx2 7/24  UC 7/24 Peritoneal CX 7/24  Abx: Ceftazidime (empiric), start date7/24 , day 0/  ENDOCRINE A:   DM  P:   SSI   NEUROLOGIC A:  Hepatic encephalopathy  High Ammonia level  CT head /C spine neg for acute  P:   RASS goal: 0 to -1 Restart Lactulose  D/c Diprivan  Fent /versed pushes As needed      FAMILY  - Updates: none present   - Inter-disciplinary family meet or Palliative Care meeting due by:  7/30     [facility] NP-C  Pulmonary and [place] Pager: [phone]  12/14/2014, 11:25 AM   Attending Note:  I have examined patient, reviewed labs, studies and notes. I have discussed the case with T Parrett, and I agree with the data and plans as amended above. Pt with hx alcoholic liver disease, coagulopathy, encephalopathy, varices. Was found unresponsive, obtunded. Intubated in the ED for airway protection. His head CT was reassuring. No evidence bleeding. On my eval he is intubated, sedated on propofol, edematous. Appears to have ascites on exam. We will admit and restart his lactulose, treat SBP empirically pending paracentesis. for MS and labs for improvement. Independent critical care time is 60 minutes.   Baltazar Apo, MD, PhD 12/14/2014, 12:13 PM [facility] [phone] or if no answer [phone]

## 2014-12-15 ENCOUNTER — Inpatient Hospital Stay (HOSPITAL_COMMUNITY): Payer: Medicare PPO

## 2014-12-15 ENCOUNTER — Encounter (HOSPITAL_COMMUNITY): Payer: Self-pay | Admitting: *Deleted

## 2014-12-15 ENCOUNTER — Encounter (HOSPITAL_COMMUNITY)
Admission: EM | Disposition: A | Discharge status: Skilled Nursing Facility | Payer: Self-pay | Source: Home / Self Care | Attending: Emergency Medicine

## 2014-12-15 DIAGNOSIS — L8990 Pressure ulcer of unspecified site, unspecified stage: Secondary | ICD-10-CM | POA: Insufficient documentation

## 2014-12-15 DIAGNOSIS — N170 Acute kidney failure with tubular necrosis: Secondary | ICD-10-CM

## 2014-12-15 HISTORY — PX: ESOPHAGOGASTRODUODENOSCOPY: SHX5428

## 2014-12-15 LAB — CBC
HCT: 20.9 % — ABNORMAL LOW (ref 39.0–52.0)
HCT: 23.3 % — ABNORMAL LOW (ref 39.0–52.0)
HEMATOCRIT: 21.8 % — AB (ref 39.0–52.0)
Hemoglobin: 7.5 g/dL — ABNORMAL LOW (ref 13.0–17.0)
Hemoglobin: 7 g/dL — ABNORMAL LOW (ref 13.0–17.0)
Hemoglobin: 7.7 g/dL — ABNORMAL LOW (ref 13.0–17.0)
MCH: 30.5 pg (ref 26.0–34.0)
MCH: 29.7 pg (ref 26.0–34.0)
MCH: 29.5 pg (ref 26.0–34.0)
MCHC: 34.4 g/dL (ref 30.0–36.0)
MCHC: 33.5 g/dL (ref 30.0–36.0)
MCHC: 33 g/dL (ref 30.0–36.0)
MCV: 88.6 fL (ref 78.0–100.0)
MCV: 88.6 fL (ref 78.0–100.0)
MCV: 89.3 fL (ref 78.0–100.0)
PLATELETS: 60 10*3/uL — AB (ref 150–400)
PLATELETS: 66 10*3/uL — AB (ref 150–400)
Platelets: 63 10*3/uL — ABNORMAL LOW (ref 150–400)
RBC: 2.46 MIL/uL — ABNORMAL LOW (ref 4.22–5.81)
RBC: 2.36 MIL/uL — ABNORMAL LOW (ref 4.22–5.81)
RBC: 2.61 MIL/uL — ABNORMAL LOW (ref 4.22–5.81)
RDW: 21.6 % — ABNORMAL HIGH (ref 11.5–15.5)
RDW: 21.9 % — AB (ref 11.5–15.5)
RDW: 21.9 % — ABNORMAL HIGH (ref 11.5–15.5)
WBC: 7.4 10*3/uL (ref 4.0–10.5)
WBC: 6.2 10*3/uL (ref 4.0–10.5)
WBC: 6.4 10*3/uL (ref 4.0–10.5)

## 2014-12-15 LAB — BASIC METABOLIC PANEL
ANION GAP: 7 (ref 5–15)
BUN: 46 mg/dL — AB (ref 6–20)
CO2: 21 mmol/L — ABNORMAL LOW (ref 22–32)
Calcium: 7.9 mg/dL — ABNORMAL LOW (ref 8.9–10.3)
Chloride: 107 mmol/L (ref 101–111)
Creatinine, Ser: 2.41 mg/dL — ABNORMAL HIGH (ref 0.61–1.24)
GFR, EST AFRICAN AMERICAN: 32 mL/min — AB (ref >60)
GFR, EST NON AFRICAN AMERICAN: 28 mL/min — AB (ref >60)
Glucose, Bld: 135 mg/dL — ABNORMAL HIGH (ref 65–99)
Potassium: 5.3 mmol/L — ABNORMAL HIGH (ref 3.5–5.1)
SODIUM: 135 mmol/L (ref 135–145)

## 2014-12-15 LAB — HEPATIC FUNCTION PANEL
ALT: 40 U/L (ref 17–63)
AST: 80 U/L — ABNORMAL HIGH (ref 15–41)
Albumin: 2.1 g/dL — ABNORMAL LOW (ref 3.5–5.0)
Alkaline Phosphatase: 103 U/L (ref 38–126)
Bilirubin, Direct: 1.2 mg/dL — ABNORMAL HIGH (ref 0.1–0.5)
Indirect Bilirubin: 3.5 mg/dL — ABNORMAL HIGH (ref 0.3–0.9)
TOTAL PROTEIN: 5.3 g/dL — AB (ref 6.5–8.1)
Total Bilirubin: 4.7 mg/dL — ABNORMAL HIGH (ref 0.3–1.2)

## 2014-12-15 LAB — MAGNESIUM: Magnesium: 2.4 mg/dL (ref 1.7–2.4)

## 2014-12-15 LAB — PROCALCITONIN

## 2014-12-15 LAB — GLUCOSE, CAPILLARY
GLUCOSE-CAPILLARY: 119 mg/dL — AB (ref 65–99)
GLUCOSE-CAPILLARY: 138 mg/dL — AB (ref 65–99)
Glucose-Capillary: 112 mg/dL — ABNORMAL HIGH (ref 65–99)
Glucose-Capillary: 117 mg/dL — ABNORMAL HIGH (ref 65–99)
Glucose-Capillary: 142 mg/dL — ABNORMAL HIGH (ref 65–99)

## 2014-12-15 LAB — URINE CULTURE
CULTURE: NO GROWTH
Culture: NO GROWTH

## 2014-12-15 LAB — PHOSPHORUS: PHOSPHORUS: 4.9 mg/dL — AB (ref 2.5–4.6)

## 2014-12-15 LAB — SODIUM, URINE, RANDOM: SODIUM UR: 97 mmol/L

## 2014-12-15 LAB — PROTIME-INR
INR: 1.66 — ABNORMAL HIGH (ref 0.00–1.49)
PROTHROMBIN TIME: 19.6 s — AB (ref 11.6–15.2)

## 2014-12-15 LAB — PREPARE RBC (CROSSMATCH)

## 2014-12-15 LAB — TROPONIN I: Troponin I: <0.03 ng/mL (ref <0.031)

## 2014-12-15 LAB — PATHOLOGIST SMEAR REVIEW

## 2014-12-15 SURGERY — EGD (ESOPHAGOGASTRODUODENOSCOPY)
Anesthesia: Moderate Sedation

## 2014-12-15 MED ORDER — SODIUM CHLORIDE 0.9 % IV SOLN
Freq: Once | INTRAVENOUS | Status: AC
  Administered 2014-12-15: 16:00:00 via INTRAVENOUS

## 2014-12-15 MED ORDER — FENTANYL CITRATE (PF) 100 MCG/2ML IJ SOLN
INTRAMUSCULAR | Status: DC | PRN
  Administered 2014-12-15 (×3): 25 ug via INTRAVENOUS

## 2014-12-15 MED ORDER — SODIUM CHLORIDE 0.9 % IV SOLN
8.0000 mg/h | INTRAVENOUS | Status: DC
  Administered 2014-12-15 – 2014-12-17 (×4): 8 mg/h via INTRAVENOUS
  Filled 2014-12-15 (×9): qty 80

## 2014-12-15 MED ORDER — FENTANYL CITRATE (PF) 100 MCG/2ML IJ SOLN
INTRAMUSCULAR | Status: AC
  Filled 2014-12-15: qty 2

## 2014-12-15 MED ORDER — MIDAZOLAM HCL 10 MG/2ML IJ SOLN
INTRAMUSCULAR | Status: DC | PRN
  Administered 2014-12-15 (×2): 2 mg via INTRAVENOUS
  Administered 2014-12-15: 1 mg via INTRAVENOUS

## 2014-12-15 MED ORDER — SODIUM CHLORIDE 0.9 % IV SOLN: Freq: Once | INTRAVENOUS | Status: DC

## 2014-12-15 MED ORDER — SODIUM CHLORIDE 0.9 % IV SOLN: INTRAVENOUS | Status: DC

## 2014-12-15 MED ORDER — DEXTROSE 5 % IV SOLN
10.0000 mg | Freq: Once | INTRAVENOUS | Status: AC
  Administered 2014-12-15: 10 mg via INTRAVENOUS
  Filled 2014-12-15 (×2): qty 1

## 2014-12-15 MED ORDER — PHYTONADIONE 5 MG PO TABS
2.5000 mg | ORAL_TABLET | Freq: Once | ORAL | Status: DC
  Filled 2014-12-15: qty 1

## 2014-12-15 MED ORDER — VITAL HIGH PROTEIN PO LIQD
1000.0000 mL | ORAL | Status: DC
  Filled 2014-12-15 (×4): qty 1000

## 2014-12-15 MED ORDER — MIDAZOLAM HCL 5 MG/ML IJ SOLN
INTRAMUSCULAR | Status: AC
  Filled 2014-12-15: qty 2

## 2014-12-15 MED ORDER — PANTOPRAZOLE SODIUM 40 MG IV SOLR
80.0000 mg | Freq: Once | INTRAVENOUS | Status: AC
  Administered 2014-12-15: 80 mg via INTRAVENOUS
  Filled 2014-12-15: qty 80

## 2014-12-15 MED ORDER — PRO-STAT SUGAR FREE PO LIQD
30.0000 mL | Freq: Every day | ORAL | Status: DC
  Administered 2014-12-15 – 2014-12-17 (×7): 30 mL via ORAL
  Filled 2014-12-15 (×13): qty 30

## 2014-12-15 MED ORDER — FUROSEMIDE 10 MG/ML IJ SOLN
40.0000 mg | Freq: Three times a day (TID) | INTRAMUSCULAR | Status: DC
  Administered 2014-12-15 – 2014-12-16 (×3): 40 mg via INTRAVENOUS
  Filled 2014-12-15 (×6): qty 4

## 2014-12-15 NOTE — Progress Notes (Signed)
EEG completed, results pending. 

## 2014-12-15 NOTE — Significant Event (Signed)
Will not be starting tube feedings as evidence of upper GI bleed per MD Titus Mould.

## 2014-12-15 NOTE — Progress Notes (Signed)
GI addendum: Called due to reported bright red blood per orogastric tube and also melenic stools. No hemodynamic instability. EGD performed at bedside showed moderate esophageal varices with no stigmata of hemorrhage and no blood in the esophagus. Stomach showed diffuse pattern of portal gastropathy with multiple punctate  erythemic areas but no blood in the stomach. Orogastric tube was in the duodenum which was entered. There were some old food material in the antrum and duodenum but no specific abnormalities within the duodenum and no blood seen there. Will transfuse one more unit packed red blood cells while awaiting CBC. Will continue octreotide and Protonix and monitor stools and hemoglobin. If obvious active bleeding despite relatively negative endoscopic findings next step might be to proceed with a pooled RBC study

## 2014-12-15 NOTE — Significant Event (Addendum)
Patient had a large BM-tarry black with traced of maroon at 1500pm. Noted frank red blood out of OG compared from this morning. Spoke with Dr. Titus Mould. Per MD, to give a total of 2 units of FFP, start Protonix drip, stat CBC, and call GI. First unit of FFP infusing now. Spoke with GI Dr. Amedeo Plenty.

## 2014-12-15 NOTE — Progress Notes (Signed)
Initial Nutrition Assessment  DOCUMENTATION CODES:   Obesity unspecified  INTERVENTION:    Initiate TF via OGT with Vital High Protein at 25 ml/h and Prostat 30 ml 5 times per day on day 1; on day 2, increase to goal rate of 40 ml/h (960 ml per day) to provide 1460 kcals, 159 gm protein, 803 ml free water daily.  NUTRITION DIAGNOSIS:   Inadequate oral intake related to inability to eat as evidenced by NPO status.  GOAL:   Provide needs based on ASPEN/SCCM guidelines  MONITOR:   Vent status, TF tolerance, Weight trends, Labs  REASON FOR ASSESSMENT:   Consult Enteral/tube feeding initiation and management  ASSESSMENT:   On 7/24 patient was found unresponsive at home with non-bloody vomiting. He was admitted to the ICU from the ED after being intubated.  Labs reviewed: potassium and phosphorus are elevated  Patient is currently intubated on ventilator support Temp (24hrs), Avg:97.9 F (36.6 C), Min:96.5 F (35.8 C), Max:98.7 F (37.1 C)   Diet Order:  Diet NPO time specified  Skin:  Wound (see comment) (stage I sacral pressure ulcer)  Last BM:  7/25  Height:   Ht Readings from Last 1 Encounters:  12/15/14 5\' 11"  (1.803 m)    Weight:   Wt Readings from Last 1 Encounters:  12/15/14 230 lb 13.2 oz (104.7 kg)    Ideal Body Weight:  78.2 kg  Wt Readings from Last 10 Encounters:  12/15/14 230 lb 13.2 oz (104.7 kg)  11/30/14 220 lb 1.6 oz (99.837 kg)  11/10/14 225 lb 5 oz (102.2 kg)  10/06/14 219 lb (99.338 kg)  09/03/14 225 lb 12 oz (102.4 kg)  08/17/14 211 lb (95.709 kg)  07/31/14 220 lb 4.8 oz (99.927 kg)  05/26/14 211 lb (95.709 kg)  03/30/14 215 lb (97.523 kg)  02/14/14 230 lb (104.327 kg)    BMI:  Body mass index is 32.21 kg/(m^2).  Estimated Nutritional Needs:   Kcal:  6314-9702  Protein:  156 gm  Fluid:  2 L  EDUCATION NEEDS:   No education needs identified at this time  Molli Barrows, [place], [place], [place] Pager [phone] After Hours  Pager [phone]

## 2014-12-15 NOTE — Procedures (Signed)
ELECTROENCEPHALOGRAM REPORT   Patient: [name]       Room #: 6K59 EEG No. ID: [id number] Age: 59 y.o.        Sex: male Referring Physician: Byrum Report Date:  12/15/2014        Interpreting Physician: Alexis Goodell  History: [name] is an 59 y.o. male found unresponsive, now intubated.    Medications:  Scheduled: . sodium chloride   Intravenous Once  . antiseptic oral rinse  7 mL Mouth Rinse QID  . cefTAZidime (FORTAZ)  IV  2 g Intravenous Q12H  . chlorhexidine  15 mL Mouth Rinse BID  . furosemide  40 mg Per Tube Daily  . insulin aspart  2-6 Units Subcutaneous 6 times per day  . lactulose  30 g Per Tube TID  . pantoprazole (PROTONIX) IV  40 mg Intravenous Q12H  . rifaximin  550 mg Per Tube BID    Conditions of Recording:  This is a 16 channel EEG carried out with the patient in the intubated and unresponsive state.  Description:  The background activity of low to moderate voltage.  It consists of a poorly organized polymorphic delta activity that is diffusely distributed.  This activity is continuous.  There are also frequent intermittent periodic discharges of triphasic morphology that are intermixed.   These discharges have a frontal predominance.   There is no evidence of stage II sleep. Hyperventilation and intermittent photic stimulation were not performed.   IMPRESSION: This is an abnormal electroencephalogram secondary to general background slowing and frequent triphasic waves that are noted most prominently at the beginning of the tracing but become less frequent as the tracing progresses.  This finding is most consistent with an encephalopathy that is etiologically nonspecific, although commonly seen with hepatic disease.   Alexis Goodell, MD [facility] [phone] 12/15/2014, 12:23 PM

## 2014-12-15 NOTE — Consult Note (Signed)
[facility] Consult Note  Referring Provider: No ref. provider found Primary Care Physician:  Barbette Merino, MD Primary Gastroenterologist:  Dr.  Laurel Dimmer Complaint: Unresponsive from encephalopathy. Reported hematemesis HPI: [name] is an 59 y.o. white male  with known alcoholic cirrhosis, found unresponsive at home with vomiting which was not bloody. He was intubated and found to have ammonia level of 252. He reportedly had some some hematemesis yesterday shortly after admission but none since then no bowel movement since admission. 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 He had a paracentesis yesterday which did not show any evidence of SBP.  Past Medical History  Diagnosis Date  . Neuropathy   . Diabetes mellitus   . Bipolar affect, depressed   . Hypertension   . Arthritis   . Stroke     Mini stroke about 77yr ago  . Cirrhosis   . Alcohol abuse   . Chronic pain   . Cocaine abuse   . Muscle spasm     both legs  . Encephalopathy, hepatic   . Detached retina   . COPD (chronic obstructive pulmonary disease)     emphysema  . Bronchitis   . Barrett's esophagus   . GERD (gastroesophageal reflux disease)     has ulcer  . Anemia     Past Surgical History  Procedure Laterality Date  . Fracture surgery      Leg and arm 147yrago  . Esophagogastroduodenoscopy  04/04/2012    Procedure: ESOPHAGOGASTRODUODENOSCOPY (EGD);  Surgeon: JoIrene ShipperMD;  [facility];  Service: Endoscopy;  Laterality: N/A;  . Esophagogastroduodenoscopy Left 03/13/2013    Procedure: ESOPHAGOGASTRODUODENOSCOPY (EGD);  Surgeon: WiArta SilenceMD;  [facility];  Service: Endoscopy;  Laterality: Left;  . Marland Kitchenye surgery  8 months ago both eyes    cataracts both eyes, detached eye, gas pocket  . Vasectomy    . Pars plana vitrectomy Left 07/08/2013    Procedure: PARS PLANA VITRECTOMY WITH 25 GAUGE;   Surgeon: GaHurman HornMD;  [facility];  Laterality: Left;  . Intraocular lens removal Left 07/08/2013    Procedure: REMOVAL OF INTRAOCULAR LENS;  Surgeon: GaHurman HornMD;  [facility];  Laterality: Left;  . Placement and suture of secondary intraocular lens Left 07/08/2013    Procedure: PLACEMENT AND SUTURE OF SECONDARY INTRAOCULAR LENS;  Surgeon: GaHurman HornMD;  [facility];  Laterality: Left;  Insertion of Anterior Capsule Intraocular Lens   . Esophagogastroduodenoscopy N/A 01/16/2014    Procedure: ESOPHAGOGASTRODUODENOSCOPY (EGD);  Surgeon: JaWinfield Cunas MD;  [facility];  Service: Endoscopy;  Laterality: N/A;  . Colonoscopy N/A 01/17/2014    Procedure: COLONOSCOPY;  Surgeon: JaWinfield Cunas MD;  [facility]  Service: Endoscopy;  Laterality: N/A;  possible banding  . Esophagogastroduodenoscopy N/A 08/30/2014    Procedure: ESOPHAGOGASTRODUODENOSCOPY (EGD);  Surgeon: ViWilford CornerMD;  [facility];  Service: Endoscopy;  Laterality: N/A;  bedside  . Esophagogastroduodenoscopy N/A 11/30/2014    Procedure: ESOPHAGOGASTRODUODENOSCOPY (EGD);  Surgeon: ViWilford CornerMD;  [facility];  Service: Endoscopy;  Laterality: N/A;    Medications Prior to Admission  Medication Sig Dispense Refill  . amitriptyline (ELAVIL) 25 MG tablet Take 1 tablet (25 mg total) by mouth at bedtime. 30 tablet 0  . CALCIUM-VITAMIN D PO Take 100 Units  by mouth daily.    . clonazePAM (KLONOPIN) 0.25 MG disintegrating tablet Take 0.25 mg by mouth 2 (two) times daily.     . diazepam (VALIUM) 5 MG tablet Take 5 mg by mouth 2 (two) times daily.  1  . diclofenac sodium (VOLTAREN) 1 % GEL Apply 4 g topically as needed (for back of leg pain).    . feeding supplement, ENSURE ENLIVE, (ENSURE ENLIVE) LIQD Take 237 mLs by mouth 2 (two) times daily between meals. 761 mL 12  . folic acid  (FOLVITE) 1 MG tablet Take 1 tablet (1 mg total) by mouth daily.    . furosemide (LASIX) 40 MG tablet Take 40 mg by mouth daily.     Marland Kitchen lactulose (CHRONULAC) 10 GM/15ML solution Take 45 mLs (30 g total) by mouth 3 (three) times daily. 240 mL 0  . LANTUS SOLOSTAR 100 UNIT/ML Solostar Pen Inject 45 Units into the skin daily at 10 pm.     . magnesium oxide (MAG-OX) 400 (241.3 MG) MG tablet Take 1 tablet (400 mg total) by mouth 2 (two) times daily. 60 tablet 0  . Multiple Vitamin (MULTIVITAMIN WITH MINERALS) TABS tablet Take 1 tablet by mouth daily.    Marland Kitchen NOVOLOG FLEXPEN 100 UNIT/ML FlexPen Inject 6 Units into the skin 3 (three) times daily with meals.    . Oxycodone HCl 10 MG TABS Take 10 mg by mouth 3 (three) times daily.  0  . pantoprazole (PROTONIX) 40 MG tablet Take 1 tablet (40 mg total) by mouth daily. 30 tablet 2  . propranolol (INDERAL) 20 MG tablet Take 20 mg by mouth 2 (two) times daily.  0  . spironolactone (ALDACTONE) 50 MG tablet Take 1 tablet (50 mg total) by mouth daily. 30 tablet 1  . thiamine (VITAMIN B-1) 100 MG tablet Take 100 mg by mouth daily.    Marland Kitchen XIFAXAN 550 MG TABS tablet Take 550 mg by mouth 2 (two) times daily.       Allergies: No Known Allergies  Family History  Problem Relation Age of Onset  . Hypotension Mother     Social History:  reports that he has been smoking Cigarettes.  He has a 30 pack-year smoking history. He has never used smokeless tobacco. He reports that he drinks alcohol. He reports that he uses illicit drugs (Cocaine).  Review of Systems: negative except as above currently unobtainable   Blood pressure 127/64, pulse 93, temperature 98 F (36.7 C), temperature source Core (Comment), resp. rate 20, weight 104.7 kg (230 lb 13.2 oz), SpO2 100 %. Head: Intubated, Normocephalic, without obvious abnormality, atraumatic Neck: no adenopathy, no carotid bruit, no JVD, supple, symmetrical, trachea midline and thyroid not enlarged, symmetric, no  tenderness/mass/nodules Resp: clear to auscultation bilaterally Cardio: regular rate and rhythm, S1, S2 normal, no murmur, click, rub or gallop GI: Abdomen distended with normoactive bowel sounds no hepatomegaly masses or guarding Extremities: extremities normal, atraumatic, no cyanosis or edema  Results for orders placed or performed during the hospital encounter of 12/14/14 (from the past 48 hour(s))  CBC with Differential/Platelet     Status: Abnormal   Collection Time: 12/14/14  9:09 AM  Result Value Ref Range   WBC 5.8 4.0 - 10.5 K/uL   RBC 2.30 (L) 4.22 - 5.81 MIL/uL   Hemoglobin 7.3 (L) 13.0 - 17.0 g/dL   HCT 21.5 (L) 39.0 - 52.0 %   MCV 93.5 78.0 - 100.0 fL   MCH 31.7 26.0 - 34.0 pg   MCHC  34.0 30.0 - 36.0 g/dL   RDW 16.5 (H) 11.5 - 15.5 %   Platelets 75 (L) 150 - 400 K/uL    Comment: PLATELET COUNT CONFIRMED BY SMEAR   Neutrophils Relative % 85 (H) 43 - 77 %   Neutro Abs 5.0 1.7 - 7.7 K/uL   Lymphocytes Relative 8 (L) 12 - 46 %   Lymphs Abs 0.5 (L) 0.7 - 4.0 K/uL   Monocytes Relative 7 3 - 12 %   Monocytes Absolute 0.4 0.1 - 1.0 K/uL   Eosinophils Relative 0 0 - 5 %   Eosinophils Absolute 0.0 0.0 - 0.7 K/uL   Basophils Relative 0 0 - 1 %   Basophils Absolute 0.0 0.0 - 0.1 K/uL  Comprehensive metabolic panel     Status: Abnormal   Collection Time: 12/14/14  9:09 AM  Result Value Ref Range   Sodium 133 (L) 135 - 145 mmol/L   Potassium 5.2 (H) 3.5 - 5.1 mmol/L   Chloride 103 101 - 111 mmol/L   CO2 21 (L) 22 - 32 mmol/L   Glucose, Bld 128 (H) 65 - 99 mg/dL   BUN 41 (H) 6 - 20 mg/dL   Creatinine, Ser 2.16 (H) 0.61 - 1.24 mg/dL   Calcium 7.9 (L) 8.9 - 10.3 mg/dL   Total Protein 5.9 (L) 6.5 - 8.1 g/dL   Albumin 2.2 (L) 3.5 - 5.0 g/dL   AST 84 (H) 15 - 41 U/L   ALT 39 17 - 63 U/L   Alkaline Phosphatase 112 38 - 126 U/L   Total Bilirubin 2.2 (H) 0.3 - 1.2 mg/dL   GFR calc non Af Amer 32 (L) >60 mL/min   GFR calc Af Amer 37 (L) >60 mL/min    Comment: (NOTE) The eGFR  has been calculated using the CKD EPI equation. This calculation has not been validated in all clinical situations. eGFR's persistently <60 mL/min signify possible Chronic Kidney Disease.    Anion gap 9 5 - 15  Protime-INR     Status: Abnormal   Collection Time: 12/14/14  9:09 AM  Result Value Ref Range   Prothrombin Time 19.7 (H) 11.6 - 15.2 seconds   INR 1.67 (H) 0.00 - 1.49  Ammonia     Status: Abnormal   Collection Time: 12/14/14  9:09 AM  Result Value Ref Range   Ammonia 252 (H) 9 - 35 umol/L  Troponin I     Status: None   Collection Time: 12/14/14  9:09 AM  Result Value Ref Range   Troponin I <0.03 <0.031 ng/mL    Comment:        NO INDICATION OF MYOCARDIAL INJURY.   CK     Status: None   Collection Time: 12/14/14  9:09 AM  Result Value Ref Range   Total CK 151 49 - 397 U/L  I-stat troponin, ED     Status: None   Collection Time: 12/14/14  9:15 AM  Result Value Ref Range   Troponin i, poc 0.01 0.00 - 0.08 ng/mL   Comment 3            Comment: Due to the release kinetics of cTnI, a negative result within the first hours of the onset of symptoms does not rule out myocardial infarction with certainty. If myocardial infarction is still suspected, repeat the test at appropriate intervals.   Urinalysis, Routine w reflex microscopic (not at [facility])     Status: Abnormal   Collection Time: 12/14/14  9:15 AM  Result Value Ref Range   Color, Urine AMBER (A) YELLOW    Comment: BIOCHEMICALS MAY BE AFFECTED BY COLOR   APPearance CLEAR CLEAR   Specific Gravity, Urine 1.016 1.005 - 1.030   pH 5.0 5.0 - 8.0   Glucose, UA NEGATIVE NEGATIVE mg/dL   Hgb urine dipstick NEGATIVE NEGATIVE   Bilirubin Urine NEGATIVE NEGATIVE   Ketones, ur NEGATIVE NEGATIVE mg/dL   Protein, ur NEGATIVE NEGATIVE mg/dL   Urobilinogen, UA 0.2 0.0 - 1.0 mg/dL   Nitrite NEGATIVE NEGATIVE   Leukocytes, UA NEGATIVE NEGATIVE    Comment: MICROSCOPIC NOT DONE ON URINES WITH NEGATIVE PROTEIN, BLOOD,  LEUKOCYTES, NITRITE, OR GLUCOSE <1000 mg/dL.  I-stat chem 8, ed     Status: Abnormal   Collection Time: 12/14/14  9:16 AM  Result Value Ref Range   Sodium 132 (L) 135 - 145 mmol/L   Potassium 5.1 3.5 - 5.1 mmol/L   Chloride 102 101 - 111 mmol/L   BUN 40 (H) 6 - 20 mg/dL   Creatinine, Ser 2.20 (H) 0.61 - 1.24 mg/dL   Glucose, Bld 121 (H) 65 - 99 mg/dL   Calcium, Ion 1.02 (L) 1.12 - 1.23 mmol/L   TCO2 18 0 - 100 mmol/L   Hemoglobin 7.5 (L) 13.0 - 17.0 g/dL   HCT 22.0 (L) 39.0 - 52.0 %  I-Stat CG4 Lactic Acid, ED     Status: Abnormal   Collection Time: 12/14/14  9:17 AM  Result Value Ref Range   Lactic Acid, Venous 2.93 (HH) 0.5 - 2.0 mmol/L   Comment NOTIFIED PHYSICIAN   I-Stat Arterial Blood Gas, ED - (order at [facility] and [facility] only)     Status: Abnormal   Collection Time: 12/14/14 10:15 AM  Result Value Ref Range   pH, Arterial 7.447 7.350 - 7.450   pCO2 arterial 32.7 (L) 35.0 - 45.0 mmHg   pO2, Arterial 405.0 (H) 80.0 - 100.0 mmHg   Bicarbonate 22.6 20.0 - 24.0 mEq/L   TCO2 24 0 - 100 mmol/L   O2 Saturation 100.0 %   Acid-base deficit 1.0 0.0 - 2.0 mmol/L   Collection site RADIAL, ALLEN'S TEST ACCEPTABLE    Drawn by Operator    Sample type ARTERIAL   CBG monitoring, ED     Status: Abnormal   Collection Time: 12/14/14 11:00 AM  Result Value Ref Range   Glucose-Capillary 111 (H) 65 - 99 mg/dL   Comment 1 Notify RN    Comment 2 Document in Chart   I-Stat CG4 Lactic Acid, ED     Status: Abnormal   Collection Time: 12/14/14  1:05 PM  Result Value Ref Range   Lactic Acid, Venous 3.02 (HH) 0.5 - 2.0 mmol/L   Comment NOTIFIED PHYSICIAN   Glucose, capillary     Status: Abnormal   Collection Time: 12/14/14  1:48 PM  Result Value Ref Range   Glucose-Capillary 139 (H) 65 - 99 mg/dL  MRSA PCR Screening     Status: None   Collection Time: 12/14/14  1:57 PM  Result Value Ref Range   MRSA by PCR NEGATIVE NEGATIVE    Comment:        The GeneXpert MRSA Assay (FDA approved for NASAL  specimens only), is one component of a comprehensive MRSA colonization surveillance program. It is not intended to diagnose MRSA infection nor to guide or monitor treatment for MRSA infections.   Urine rapid drug screen (hosp performed)     Status: Abnormal   Collection Time: 12/14/14  2:41 PM  Result Value Ref Range   Opiates NONE DETECTED NONE DETECTED   Cocaine NONE DETECTED NONE DETECTED   Benzodiazepines POSITIVE (A) NONE DETECTED   Amphetamines NONE DETECTED NONE DETECTED   Tetrahydrocannabinol NONE DETECTED NONE DETECTED   Barbiturates NONE DETECTED NONE DETECTED    Comment:        DRUG SCREEN FOR MEDICAL PURPOSES ONLY.  IF CONFIRMATION IS NEEDED FOR ANY PURPOSE, NOTIFY LAB WITHIN 5 DAYS.        LOWEST DETECTABLE LIMITS FOR URINE DRUG SCREEN Drug Class       Cutoff (ng/mL) Amphetamine      1000 Barbiturate      200 Benzodiazepine   597 Tricyclics       416 Opiates          300 Cocaine          300 THC              50   Urinalysis, Routine w reflex microscopic (not at [facility])     Status: Abnormal   Collection Time: 12/14/14  2:41 PM  Result Value Ref Range   Color, Urine AMBER (A) YELLOW    Comment: BIOCHEMICALS MAY BE AFFECTED BY COLOR   APPearance CLEAR CLEAR   Specific Gravity, Urine 1.017 1.005 - 1.030   pH 5.0 5.0 - 8.0   Glucose, UA NEGATIVE NEGATIVE mg/dL   Hgb urine dipstick LARGE (A) NEGATIVE   Bilirubin Urine NEGATIVE NEGATIVE   Ketones, ur 15 (A) NEGATIVE mg/dL   Protein, ur NEGATIVE NEGATIVE mg/dL   Urobilinogen, UA 0.2 0.0 - 1.0 mg/dL   Nitrite NEGATIVE NEGATIVE   Leukocytes, UA SMALL (A) NEGATIVE  Urine microscopic-add on     Status: Abnormal   Collection Time: 12/14/14  2:41 PM  Result Value Ref Range   Squamous Epithelial / LPF RARE RARE   WBC, UA 3-6 <3 WBC/hpf   RBC / HPF 11-20 <3 RBC/hpf   Casts HYALINE CASTS (A) NEGATIVE    Comment: GRANULAR CAST WAXY CAST   Body fluid cell count with differential     Status: Abnormal   Collection  Time: 12/14/14  2:58 PM  Result Value Ref Range   Fluid Type-FCT FLUID     Comment: PERITONEAL CAVITY CORRECTED ON 07/24 AT 1521: PREVIOUSLY REPORTED AS ASCITIC    Color, Fluid YELLOW YELLOW   Appearance, Fluid HAZY (A) CLEAR   WBC, Fluid 58 0 - 1000 cu mm   Neutrophil Count, Fluid 13 0 - 25 %   Lymphs, Fluid 34 %   Monocyte-Macrophage-Serous Fluid 53 50 - 90 %   Other Cells, Fluid OTHER CELLS IDENTIFIED AS MESOTHELIAL CELLS %  Albumin fluid, pleural or peritoneal     Status: None   Collection Time: 12/14/14  2:58 PM  Result Value Ref Range   Albumin, Fluid <1.0 g/dL    Comment: RESULT REPEATED AND VERIFIED (NOTE) No normal range established for this test Results should be evaluated in conjunction with serum values    Fluid Type-FALB FLUID     Comment: PERITONEAL CAVITY CORRECTED ON 07/24 AT 1520: PREVIOUSLY REPORTED AS PERITONEAL CAVITY   Gram stain     Status: None   Collection Time: 12/14/14  2:58 PM  Result Value Ref Range   Specimen Description PERITONEAL    Special Requests NONE    Gram Stain MODERATE POLYMORPHONUCLEAR NO ORGANISMS SEEN     Report Status 12/14/2014 FINAL   Ethanol     Status:  None   Collection Time: 12/14/14  4:00 PM  Result Value Ref Range   Alcohol, Ethyl (B) <5 <5 mg/dL    Comment:        LOWEST DETECTABLE LIMIT FOR SERUM ALCOHOL IS 5 mg/dL FOR MEDICAL PURPOSES ONLY   Basic metabolic panel     Status: Abnormal   Collection Time: 12/14/14  4:00 PM  Result Value Ref Range   Sodium 133 (L) 135 - 145 mmol/L   Potassium 4.9 3.5 - 5.1 mmol/L   Chloride 105 101 - 111 mmol/L   CO2 20 (L) 22 - 32 mmol/L   Glucose, Bld 142 (H) 65 - 99 mg/dL   BUN 43 (H) 6 - 20 mg/dL   Creatinine, Ser 2.13 (H) 0.61 - 1.24 mg/dL   Calcium 7.7 (L) 8.9 - 10.3 mg/dL   GFR calc non Af Amer 32 (L) >60 mL/min   GFR calc Af Amer 38 (L) >60 mL/min    Comment: (NOTE) The eGFR has been calculated using the CKD EPI equation. This calculation has not been validated in all  clinical situations. eGFR's persistently <60 mL/min signify possible Chronic Kidney Disease.    Anion gap 8 5 - 15  Amylase     Status: None   Collection Time: 12/14/14  4:00 PM  Result Value Ref Range   Amylase 49 28 - 100 U/L  Lipase, blood     Status: None   Collection Time: 12/14/14  4:00 PM  Result Value Ref Range   Lipase 32 22 - 51 U/L  Troponin I     Status: None   Collection Time: 12/14/14  4:00 PM  Result Value Ref Range   Troponin I <0.03 <0.031 ng/mL    Comment:        NO INDICATION OF MYOCARDIAL INJURY.   Procalcitonin     Status: None   Collection Time: 12/14/14  4:00 PM  Result Value Ref Range   Procalcitonin <0.10 ng/mL    Comment:        Interpretation: PCT (Procalcitonin) <= 0.5 ng/mL: Systemic infection (sepsis) is not likely. Local bacterial infection is possible. (NOTE)         ICU PCT Algorithm               Non ICU PCT Algorithm    ----------------------------     ------------------------------         PCT < 0.25 ng/mL                 PCT < 0.1 ng/mL     Stopping of antibiotics            Stopping of antibiotics       strongly encouraged.               strongly encouraged.    ----------------------------     ------------------------------       PCT level decrease by               PCT < 0.25 ng/mL       >= 80% from peak PCT       OR PCT 0.25 - 0.5 ng/mL          Stopping of antibiotics                                             encouraged.  Stopping of antibiotics           encouraged.    ----------------------------     ------------------------------       PCT level decrease by              PCT >= 0.25 ng/mL       < 80% from peak PCT        AND PCT >= 0.5 ng/mL            Continuin g antibiotics                                              encouraged.       Continuing antibiotics            encouraged.    ----------------------------     ------------------------------     PCT level increase compared          PCT > 0.5 ng/mL         with  peak PCT AND          PCT >= 0.5 ng/mL             Escalation of antibiotics                                          strongly encouraged.      Escalation of antibiotics        strongly encouraged.   Type and screen     Status: None   Collection Time: 12/14/14  4:00 PM  Result Value Ref Range   ABO/RH(D) O POS    Antibody Screen NEG    Sample Expiration 12/17/2014    Unit Number [id number]    Blood Component Type RED CELLS,LR    Unit division 00    Status of Unit ISSUED,FINAL    Transfusion Status OK TO TRANSFUSE    Crossmatch Result Compatible   Hemoglobin and hematocrit, blood     Status: Abnormal   Collection Time: 12/14/14  4:00 PM  Result Value Ref Range   Hemoglobin 7.1 (L) 13.0 - 17.0 g/dL   HCT 20.8 (L) 39.0 - 52.0 %  Glucose, capillary     Status: Abnormal   Collection Time: 12/14/14  4:26 PM  Result Value Ref Range   Glucose-Capillary 136 (H) 65 - 99 mg/dL  Prepare RBC     Status: None   Collection Time: 12/14/14  5:37 PM  Result Value Ref Range   Order Confirmation ORDER PROCESSED BY BLOOD BANK   Blood gas, arterial     Status: Abnormal   Collection Time: 12/14/14  6:26 PM  Result Value Ref Range   FIO2 0.40 %   Delivery systems VENTILATOR    Mode PRESSURE REGULATED VOLUME CONTROL    VT 600 mL   Rate 16 resp/min   Peep/cpap 5.0 cm H20   pH, Arterial 7.484 (H) 7.350 - 7.450   pCO2 arterial 25.8 (L) 35.0 - 45.0 mmHg   pO2, Arterial 183 (H) 80.0 - 100.0 mmHg   Bicarbonate 19.1 (L) 20.0 - 24.0 mEq/L   TCO2 19.9 0 - 100 mmol/L   Acid-base deficit 3.8 (H) 0.0 - 2.0 mmol/L   O2 Saturation 99.8 %   Patient temperature 98.6  Collection site RIGHT RADIAL    Drawn by [phone]    Sample type ARTERIAL DRAW    Allens test (pass/fail) PASS PASS  Glucose, capillary     Status: Abnormal   Collection Time: 12/14/14  7:50 PM  Result Value Ref Range   Glucose-Capillary 126 (H) 65 - 99 mg/dL  Troponin I     Status: None   Collection Time: 12/14/14  8:43 PM  Result  Value Ref Range   Troponin I <0.03 <0.031 ng/mL    Comment:        NO INDICATION OF MYOCARDIAL INJURY.   CBC     Status: Abnormal   Collection Time: 12/14/14 11:15 PM  Result Value Ref Range   WBC 7.3 4.0 - 10.5 K/uL   RBC 2.49 (L) 4.22 - 5.81 MIL/uL   Hemoglobin 7.4 (L) 13.0 - 17.0 g/dL   HCT 21.9 (L) 39.0 - 52.0 %   MCV 88.0 78.0 - 100.0 fL   MCH 29.7 26.0 - 34.0 pg   MCHC 33.8 30.0 - 36.0 g/dL   RDW 21.0 (H) 11.5 - 15.5 %   Platelets 64 (L) 150 - 400 K/uL    Comment: CONSISTENT WITH PREVIOUS RESULT  Glucose, capillary     Status: Abnormal   Collection Time: 12/14/14 11:52 PM  Result Value Ref Range   Glucose-Capillary 117 (H) 65 - 99 mg/dL   Comment 1 Notify RN   Troponin I     Status: None   Collection Time: 12/15/14  1:30 AM  Result Value Ref Range   Troponin I <0.03 <0.031 ng/mL    Comment:        NO INDICATION OF MYOCARDIAL INJURY.   Glucose, capillary     Status: Abnormal   Collection Time: 12/15/14  4:06 AM  Result Value Ref Range   Glucose-Capillary 119 (H) 65 - 99 mg/dL  CBC     Status: Abnormal   Collection Time: 12/15/14  5:10 AM  Result Value Ref Range   WBC 7.4 4.0 - 10.5 K/uL   RBC 2.46 (L) 4.22 - 5.81 MIL/uL   Hemoglobin 7.5 (L) 13.0 - 17.0 g/dL   HCT 21.8 (L) 39.0 - 52.0 %   MCV 88.6 78.0 - 100.0 fL   MCH 30.5 26.0 - 34.0 pg   MCHC 34.4 30.0 - 36.0 g/dL   RDW 21.6 (H) 11.5 - 15.5 %   Platelets 63 (L) 150 - 400 K/uL    Comment: CONSISTENT WITH PREVIOUS RESULT  Basic metabolic panel     Status: Abnormal   Collection Time: 12/15/14  5:10 AM  Result Value Ref Range   Sodium 135 135 - 145 mmol/L   Potassium 5.3 (H) 3.5 - 5.1 mmol/L   Chloride 107 101 - 111 mmol/L   CO2 21 (L) 22 - 32 mmol/L   Glucose, Bld 135 (H) 65 - 99 mg/dL   BUN 46 (H) 6 - 20 mg/dL   Creatinine, Ser 2.41 (H) 0.61 - 1.24 mg/dL   Calcium 7.9 (L) 8.9 - 10.3 mg/dL   GFR calc non Af Amer 28 (L) >60 mL/min   GFR calc Af Amer 32 (L) >60 mL/min    Comment: (NOTE) The eGFR has  been calculated using the CKD EPI equation. This calculation has not been validated in all clinical situations. eGFR's persistently <60 mL/min signify possible Chronic Kidney Disease.    Anion gap 7 5 - 15  Magnesium     Status: None   Collection Time:  12/15/14  5:10 AM  Result Value Ref Range   Magnesium 2.4 1.7 - 2.4 mg/dL  Phosphorus     Status: Abnormal   Collection Time: 12/15/14  5:10 AM  Result Value Ref Range   Phosphorus 4.9 (H) 2.5 - 4.6 mg/dL  Hepatic function panel     Status: Abnormal   Collection Time: 12/15/14  5:10 AM  Result Value Ref Range   Total Protein 5.3 (L) 6.5 - 8.1 g/dL   Albumin 2.1 (L) 3.5 - 5.0 g/dL   AST 80 (H) 15 - 41 U/L   ALT 40 17 - 63 U/L   Alkaline Phosphatase 103 38 - 126 U/L   Total Bilirubin 4.7 (H) 0.3 - 1.2 mg/dL   Bilirubin, Direct 1.2 (H) 0.1 - 0.5 mg/dL   Indirect Bilirubin 3.5 (H) 0.3 - 0.9 mg/dL  Protime-INR     Status: Abnormal   Collection Time: 12/15/14  5:10 AM  Result Value Ref Range   Prothrombin Time 19.6 (H) 11.6 - 15.2 seconds   INR 1.66 (H) 0.00 - 1.49   Ct Head Wo Contrast  12/14/2014   CLINICAL DATA:  Patient found unresponsive today. Initial encounter.  EXAM: CT HEAD WITHOUT CONTRAST  CT CERVICAL SPINE WITHOUT CONTRAST  TECHNIQUE: Multidetector CT imaging of the head and cervical spine was performed following the standard protocol without intravenous contrast. Multiplanar CT image reconstructions of the cervical spine were also generated.  COMPARISON:  Head CT scan 11/10/2014. Head and cervical spine CT scans 08/29/2014.  FINDINGS: CT HEAD FINDINGS  There is advanced for age appearing cortical atrophy. There is also some chronic microvascular ischemic change. No evidence of acute abnormality including hemorrhage, infarct, mass lesion, mass effect, midline shift or abnormal extra-axial fluid collection is identified. The calvarium is intact. Imaged paranasal sinuses and mastoid air cells are clear.  CT CERVICAL SPINE FINDINGS   There is no fracture or malalignment of the cervical spine. Endotracheal tube and OG tube are in place. Intervertebral disc space height is maintained. Lung apices are clear.  IMPRESSION: No acute abnormality head or cervical spine.  Atrophy and chronic microvascular ischemic change.   Electronically Signed   By: Inge Rise M.D.   On: 12/14/2014 10:02   Ct Cervical Spine Wo Contrast  12/14/2014   CLINICAL DATA:  Patient found unresponsive today. Initial encounter.  EXAM: CT HEAD WITHOUT CONTRAST  CT CERVICAL SPINE WITHOUT CONTRAST  TECHNIQUE: Multidetector CT imaging of the head and cervical spine was performed following the standard protocol without intravenous contrast. Multiplanar CT image reconstructions of the cervical spine were also generated.  COMPARISON:  Head CT scan 11/10/2014. Head and cervical spine CT scans 08/29/2014.  FINDINGS: CT HEAD FINDINGS  There is advanced for age appearing cortical atrophy. There is also some chronic microvascular ischemic change. No evidence of acute abnormality including hemorrhage, infarct, mass lesion, mass effect, midline shift or abnormal extra-axial fluid collection is identified. The calvarium is intact. Imaged paranasal sinuses and mastoid air cells are clear.  CT CERVICAL SPINE FINDINGS  There is no fracture or malalignment of the cervical spine. Endotracheal tube and OG tube are in place. Intervertebral disc space height is maintained. Lung apices are clear.  IMPRESSION: No acute abnormality head or cervical spine.  Atrophy and chronic microvascular ischemic change.   Electronically Signed   By: Inge Rise M.D.   On: 12/14/2014 10:02   Dg Chest Portable 1 View  12/14/2014   CLINICAL DATA:  Patient unresponsive.  Status  post intubation.  EXAM: PORTABLE CHEST - 1 VIEW  COMPARISON:  PA and lateral chest 12/09/2014.  FINDINGS: Endotracheal tube is in place with the tip approximately 0.5 cm above the carina. Recommend withdrawal of 1.5-2 cm. NG tube  courses into the stomach and below the inferior margin of film. Lung volumes are low with basilar atelectasis. Heart size is normal.  IMPRESSION: ETT tip projects 0.5 cm above the carina. Recommend withdrawal of 1.5-2 cm.  Basilar atelectasis in a low volume chest.  Critical Value/emergent results were called by telephone at the time of interpretation on 12/14/2014 at 9:31 am to Dr. Ezequiel Essex , who verbally acknowledged these results.   Electronically Signed   By: Inge Rise M.D.   On: 12/14/2014 09:32    1. Assessment: 1. Alcoholic cirrhosis with hepatic encephalopathy requiring intubation 2. Ascites. Does not meet criteria for SBP 3. Possible GI bleeding is not appear destabilizing with recent EGD on July 10 showing portal hypertensive gastropathy Plan:  1. Continue supportive care and wean respiratory support as needed  2. Continue octreotide and Protonix lactulose andXifaxin for now.  3. Monitor stools hemoglobin and any further emesis for possible need for repeat EGD.  Geraldine Sandberg C 12/15/2014, 8:10 AM  Pager [phone] If no answer or after 5 PM call [phone]

## 2014-12-15 NOTE — H&P (Signed)
PULMONARY / CRITICAL CARE MEDICINE   Name: [name] MRN: [id number] DOB: 11-11-1955    ADMISSION DATE:  12/14/2014  REFERRING MD :  EDP   CHIEF COMPLAINT:  Altered mental status  INITIAL PRESENTATION: 59 yo male with ETOH cirrhosis, portal HTN with varices and ascites found unresponsive at home covered in vomit. Intubated in ER on . PCCM admitted  59yo male with PMH of alcoholic cirrhosis, ascites, recurrent hospitalizations for hepatic encephalopathy, diastolic HF, type 2 diabetes, hx of cocaine and alcohol abuse. He was recently discharged from the medicine service (on 12/05/14) from GI bleed due to esophageal varices. He had an EGD on 7/10 that showed portal gastropathy and varices with no hemorrhage, and bleeding spontaneously resolved.   On 7/24 patient was found unresponsive at home with non-bloody vomiting. He was admitted to the ICU from the ED after being intubated. Head CT was negative, paracentesis was negative for SBP, so due to his high ammonia levels, and unreponsiveness on vent, patient is being treated for hepatic encephalopathy and liver failure symptoms with octreotide, protonix, lactulose, and xifaxin.   STUDIES:  7/24 CT head and C spine - chronic atrophy and microvascular ischemia, no acute process   SIGNIFICANT EVENTS/STUDIES: 7/24 found unresponsive at home, ER  7/24 paracentesis - negative for SBP  SUBJECTIVE:  Intubated on vent   VITAL SIGNS: Temp:  [95.4 F (35.2 C)-98.7 F (37.1 C)] 98 F (36.7 C) (07/25 0900) Pulse Rate:  [71-98] 93 (07/25 0900) Resp:  [10-24] 11 (07/25 0900) BP: (88-151)/(47-75) 138/62 mmHg (07/25 0900) SpO2:  [99 %-100 %] 100 % (07/25 0900) FiO2 (%):  [40 %-100 %] 40 % (07/25 0900) Weight:  [104.3 kg (229 lb 15 oz)-104.7 kg (230 lb 13.2 oz)] 104.7 kg (230 lb 13.2 oz) (07/25 0500) HEMODYNAMICS:   VENTILATOR SETTINGS: Vent Mode:  [-] PSV;CPAP FiO2 (%):  [40 %-100 %] 40 % Set Rate:  [16 bmp] 16 bmp Vt Set:  [600 mL] 600  mL PEEP:  [5 cmH20] 5 cmH20 Pressure Support:  [5 cmH20] 5 cmH20 Plateau Pressure:  [8 cmH20-17 cmH20] 8 cmH20 INTAKE / OUTPUT:  Intake/Output Summary (Last 24 hours) at 12/15/14 1003 Last data filed at 12/15/14 0800  Gross per 24 hour  Intake   1905 ml  Output    625 ml  Net   1280 ml    PHYSICAL EXAMINATION: General:  Jaundiced, edematous male, laying in bed sedated on vent Neuro:  Sedated, partially opens eyes to touch, rass -3 HEENT:  ETT, dry mucosa  Cardiovascular:  NSR, 1+ systolic murmur, 2+ edema to mid thigh, forearms Lungs:  Coarse rhonchi throughout lungs, no wheezes Abdomen:  Distended, Normo-hyperactive BS Musculoskeletal:  Intact, C Collar in place  Skin:  Intact   LABS:  CBC  Recent Labs Lab 12/14/14 0909  12/14/14 1600 12/14/14 2315 12/15/14 0510  WBC 5.8  --   --  7.3 7.4  HGB 7.3*  < > 7.1* 7.4* 7.5*  HCT 21.5*  < > 20.8* 21.9* 21.8*  PLT 75*  --   --  64* 63*  < > = values in this interval not displayed. Coag's  Recent Labs Lab 12/09/14 1129 12/14/14 0909 12/15/14 0510  INR 1.50* 1.67* 1.66*   BMET  Recent Labs Lab 12/14/14 0909 12/14/14 0916 12/14/14 1600 12/15/14 0510  NA 133* 132* 133* 135  K 5.2* 5.1 4.9 5.3*  CL 103 102 105 107  CO2 21*  --  20* 21*  BUN 41*  40* 43* 46*  CREATININE 2.16* 2.20* 2.13* 2.41*  GLUCOSE 128* 121* 142* 135*   Electrolytes  Recent Labs Lab 12/14/14 0909 12/14/14 1600 12/15/14 0510  CALCIUM 7.9* 7.7* 7.9*  MG  --   --  2.4  PHOS  --   --  4.9*   Sepsis Markers  Recent Labs Lab 12/14/14 0917 12/14/14 1305 12/14/14 1600  LATICACIDVEN 2.93* 3.02*  --   PROCALCITON  --   --  <0.10   ABG  Recent Labs Lab 12/14/14 1015 12/14/14 1826  PHART 7.447 7.484*  PCO2ART 32.7* 25.8*  PO2ART 405.0* 183*   Liver Enzymes  Recent Labs Lab 12/09/14 1129 12/14/14 0909 12/15/14 0510  AST 84* 84* 80*  ALT 40 39 40  ALKPHOS 114 112 103  BILITOT 2.4* 2.2* 4.7*  ALBUMIN 2.3* 2.2* 2.1*    Cardiac Enzymes  Recent Labs Lab 12/14/14 1600 12/14/14 2043 12/15/14 0130  TROPONINI <0.03 <0.03 <0.03   Glucose  Recent Labs Lab 12/14/14 1348 12/14/14 1626 12/14/14 1950 12/14/14 2352 12/15/14 0406 12/15/14 0819  GLUCAP 139* 136* 126* 117* 119* 112*    Imaging CXR - bibasilar atelectasis, low lung volume  ASSESSMENT / PLAN:  PULMONARY OETT 7/24 >> A: Acute Respiratory Failure secondary to inability to protect airway  At risk for aspiration PNA although no evidence so far Low lung volumes and bibasilar atelectasis by CXR 7/24 P:   -Follow ABG noted, keep same MV -VAP precautions -Assess for Daily SBT/weaning, neurostatus does not support extubation at this stage -Repeat CXR in am -cpap  5 ps 5, ps increase if needed -even balance goals  CARDIOVASCULAR A:  Diastolic CHF (Echo 01/3817 w/ EF 29%, grade 1 diastolic dysfunction)  Normal BNP P:  -Normal BNP, likely not heart failure exacerbation component -BB on hold for now  -Start lasix IV 40 tid   RENAL A:   Acute on chronic renal failure (1.47 baseline, 2.41 today )  Etiology: ATN, hepatorenal Volume overloaded P:   -Fluid restriction -Consider albumin due to worsening kidney injury likely secondary to liver failure / sepsis? If drops BP -dc spironolactone for potassium -bmet in pm  -send urine na   GASTROINTESTINAL NG tube 9/37 >> A:   Alcoholic Cirrhosis  Ascites, negative for SBP Hx varices and GIB, no evidence at this time.  P:   -Follow LFT  -Consider another paracentesis for symptomatic relief, may require -NPO -Cont Xifaxan per tube  -Cont octreotide -PPI -statr TF  HEMATOLOGIC A:   Chronic Thrombocytopenia -plt 75 Hypercoaguable w/ chronic liver dz -INR 1.66 Chronic Anemia with hepatic gastropathy/varices -no sign of active bleeding, Hgb stable at 7.5, dilution, lasix start P:  -Transfuse for hgb <7 -Continue trending CBC  -Trend INR -SCD (no heparin) -Type and screen  done -lasix , re assess hct  INFECTIOUS A:   No clear evidence of sepsis At risk for SBP, at risk for aspiration pneumonia with no signs of active infection Lactate 3.02, negative procalcitonin P:   BC x2 7/24 >> UC 7/24 >> Peritoneal CX 7/24 >> negative for SBP  Abx:  Ceftazidime (empiric) 7/24>>> In am may dc ABX, pct neg, repeat in am   ENDOCRINE A:    Type 2 DM P:   -CBGs -SSI   NEUROLOGIC A:   Hepatic encephalopathy  High Ammonia level  CT head /C spine neg for acute process P:   -Remove CT collar if able to clear clinically, unable thus far  -RASS goal: 0 to -  1 -Continue Lactulose to 4 BM day, cont rifax -D/c Diprivan  -Fent versed pushes as needed   -dc versed  FAMILY  - Updates: none present   - Inter-disciplinary family meet or Palliative Care meeting due by:  7/30  Ccm time 30 min   Lavon Paganini. Titus Mould, MD, [place] Pgr: [facility]

## 2014-12-15 NOTE — Significant Event (Signed)
EGD at bedside at this time. Will start 2nd unit of FFP once completion of EGD due to lack of IV access.

## 2014-12-16 ENCOUNTER — Encounter (HOSPITAL_COMMUNITY): Payer: Self-pay | Admitting: Gastroenterology

## 2014-12-16 ENCOUNTER — Inpatient Hospital Stay (HOSPITAL_COMMUNITY): Payer: Medicare PPO

## 2014-12-16 LAB — PREPARE FRESH FROZEN PLASMA
UNIT DIVISION: 0
Unit division: 0

## 2014-12-16 LAB — CBC WITH DIFFERENTIAL/PLATELET
BASOS PCT: 1 % (ref 0–1)
Basophils Absolute: 0 10*3/uL (ref 0.0–0.1)
Eosinophils Absolute: 0.4 10*3/uL (ref 0.0–0.7)
Eosinophils Relative: 8 % — ABNORMAL HIGH (ref 0–5)
HCT: 21.1 % — ABNORMAL LOW (ref 39.0–52.0)
Hemoglobin: 7 g/dL — ABNORMAL LOW (ref 13.0–17.0)
LYMPHS ABS: 0.8 10*3/uL (ref 0.7–4.0)
Lymphocytes Relative: 16 % (ref 12–46)
MCH: 29.5 pg (ref 26.0–34.0)
MCHC: 33.2 g/dL (ref 30.0–36.0)
MCV: 89 fL (ref 78.0–100.0)
MONO ABS: 0.6 10*3/uL (ref 0.1–1.0)
Monocytes Relative: 12 % (ref 3–12)
NEUTROS ABS: 3.3 10*3/uL (ref 1.7–7.7)
Neutrophils Relative %: 64 % (ref 43–77)
Platelets: 55 10*3/uL — ABNORMAL LOW (ref 150–400)
RBC: 2.37 MIL/uL — ABNORMAL LOW (ref 4.22–5.81)
RDW: 20.8 % — AB (ref 11.5–15.5)
WBC: 5.2 10*3/uL (ref 4.0–10.5)

## 2014-12-16 LAB — CBC
HCT: 22 % — ABNORMAL LOW (ref 39.0–52.0)
HCT: 23.5 % — ABNORMAL LOW (ref 39.0–52.0)
HEMOGLOBIN: 7.8 g/dL — AB (ref 13.0–17.0)
Hemoglobin: 7.4 g/dL — ABNORMAL LOW (ref 13.0–17.0)
MCH: 29.5 pg (ref 26.0–34.0)
MCH: 30 pg (ref 26.0–34.0)
MCHC: 33.2 g/dL (ref 30.0–36.0)
MCHC: 33.6 g/dL (ref 30.0–36.0)
MCV: 89 fL (ref 78.0–100.0)
MCV: 89.1 fL (ref 78.0–100.0)
Platelets: 49 10*3/uL — ABNORMAL LOW (ref 150–400)
Platelets: 60 10*3/uL — ABNORMAL LOW (ref 150–400)
RBC: 2.47 MIL/uL — ABNORMAL LOW (ref 4.22–5.81)
RBC: 2.64 MIL/uL — ABNORMAL LOW (ref 4.22–5.81)
RDW: 19.9 % — AB (ref 11.5–15.5)
RDW: 20.4 % — ABNORMAL HIGH (ref 11.5–15.5)
WBC: 4.2 10*3/uL (ref 4.0–10.5)
WBC: 5.5 10*3/uL (ref 4.0–10.5)

## 2014-12-16 LAB — GLUCOSE, CAPILLARY
GLUCOSE-CAPILLARY: 134 mg/dL — AB (ref 65–99)
Glucose-Capillary: 136 mg/dL — ABNORMAL HIGH (ref 65–99)
Glucose-Capillary: 124 mg/dL — ABNORMAL HIGH (ref 65–99)
Glucose-Capillary: 106 mg/dL — ABNORMAL HIGH (ref 65–99)
Glucose-Capillary: 124 mg/dL — ABNORMAL HIGH (ref 65–99)
Glucose-Capillary: 122 mg/dL — ABNORMAL HIGH (ref 65–99)
Glucose-Capillary: 124 mg/dL — ABNORMAL HIGH (ref 65–99)

## 2014-12-16 LAB — COMPREHENSIVE METABOLIC PANEL
ALK PHOS: 86 U/L (ref 38–126)
ALT: 34 U/L (ref 17–63)
AST: 68 U/L — ABNORMAL HIGH (ref 15–41)
Albumin: 2.1 g/dL — ABNORMAL LOW (ref 3.5–5.0)
Anion gap: 7 (ref 5–15)
BUN: 49 mg/dL — ABNORMAL HIGH (ref 6–20)
CO2: 22 mmol/L (ref 22–32)
Calcium: 7.9 mg/dL — ABNORMAL LOW (ref 8.9–10.3)
Chloride: 104 mmol/L (ref 101–111)
Creatinine, Ser: 2.19 mg/dL — ABNORMAL HIGH (ref 0.61–1.24)
GFR calc Af Amer: 36 mL/min — ABNORMAL LOW (ref >60)
GFR, EST NON AFRICAN AMERICAN: 31 mL/min — AB (ref >60)
Glucose, Bld: 125 mg/dL — ABNORMAL HIGH (ref 65–99)
Potassium: 4.1 mmol/L (ref 3.5–5.1)
SODIUM: 133 mmol/L — AB (ref 135–145)
TOTAL PROTEIN: 5.1 g/dL — AB (ref 6.5–8.1)
Total Bilirubin: 3.8 mg/dL — ABNORMAL HIGH (ref 0.3–1.2)

## 2014-12-16 LAB — PROCALCITONIN: Procalcitonin: <0.1 ng/mL

## 2014-12-16 LAB — PREPARE RBC (CROSSMATCH)

## 2014-12-16 LAB — BASIC METABOLIC PANEL
ANION GAP: 10 (ref 5–15)
BUN: 49 mg/dL — ABNORMAL HIGH (ref 6–20)
CALCIUM: 8.2 mg/dL — AB (ref 8.9–10.3)
CHLORIDE: 106 mmol/L (ref 101–111)
CO2: 20 mmol/L — AB (ref 22–32)
CREATININE: 2.29 mg/dL — AB (ref 0.61–1.24)
GFR calc non Af Amer: 30 mL/min — ABNORMAL LOW (ref >60)
GFR, EST AFRICAN AMERICAN: 34 mL/min — AB (ref >60)
Glucose, Bld: 147 mg/dL — ABNORMAL HIGH (ref 65–99)
Potassium: 4.6 mmol/L (ref 3.5–5.1)
Sodium: 136 mmol/L (ref 135–145)

## 2014-12-16 LAB — PROTIME-INR
INR: 1.59 — AB (ref 0.00–1.49)
Prothrombin Time: 19 seconds — ABNORMAL HIGH (ref 11.6–15.2)

## 2014-12-16 MED ORDER — SODIUM CHLORIDE 0.9 % IV SOLN
Freq: Once | INTRAVENOUS | Status: AC
  Administered 2014-12-16: 11:00:00 via INTRAVENOUS

## 2014-12-16 MED ORDER — SODIUM CHLORIDE 0.9 % IV SOLN
50.0000 ug/h | INTRAVENOUS | Status: DC
  Administered 2014-12-16 – 2014-12-17 (×3): 50 ug/h via INTRAVENOUS
  Filled 2014-12-16 (×5): qty 1

## 2014-12-16 MED ORDER — FUROSEMIDE 10 MG/ML IJ SOLN
60.0000 mg | Freq: Four times a day (QID) | INTRAMUSCULAR | Status: DC
  Administered 2014-12-16 – 2014-12-17 (×4): 60 mg via INTRAVENOUS
  Filled 2014-12-16 (×7): qty 6

## 2014-12-16 NOTE — Progress Notes (Signed)
Pt noted to have bright red blood around his lips and the inside of his mouth, increase bleeding with mouth care. Dr Alease Frame made aware. Will continue to monitor.  Babs Bertin RN

## 2014-12-16 NOTE — Progress Notes (Signed)
[facility] Progress Note  Subjective: Patient still intubated, sanguinous fluid in orogastric canister, no further bloody stools reported since yesterday  Objective: Vital signs in last 24 hours: Temp:  [94.4 F (34.7 C)-99.5 F (37.5 C)] 98.7 F (37.1 C) (07/26 0800) Pulse Rate:  [70-107] 79 (07/26 0800) Resp:  [9-30] 16 (07/26 0800) BP: (113-171)/(44-82) 140/65 mmHg (07/26 0800) SpO2:  [100 %] 100 % (07/26 0800) FiO2 (%):  [40 %] 40 % (07/26 0832) Weight:  [104.1 kg (229 lb 8 oz)] 104.1 kg (229 lb 8 oz) (07/26 0437) Weight change: -0.2 kg (-7.1 oz)   PE: Intubated, appears sedated, color good  Lab Results: Results for orders placed or performed during the hospital encounter of 12/14/14 (from the past 24 hour(s))  CBC     Status: Abnormal   Collection Time: 12/15/14 11:25 AM  Result Value Ref Range   WBC 6.2 4.0 - 10.5 K/uL   RBC 2.36 (L) 4.22 - 5.81 MIL/uL   Hemoglobin 7.0 (L) 13.0 - 17.0 g/dL   HCT 20.9 (L) 39.0 - 52.0 %   MCV 88.6 78.0 - 100.0 fL   MCH 29.7 26.0 - 34.0 pg   MCHC 33.5 30.0 - 36.0 g/dL   RDW 21.9 (H) 11.5 - 15.5 %   Platelets 60 (L) 150 - 400 K/uL  Glucose, capillary     Status: Abnormal   Collection Time: 12/15/14 11:59 AM  Result Value Ref Range   Glucose-Capillary 136 (H) 65 - 99 mg/dL  Prepare fresh frozen plasma     Status: None   Collection Time: 12/15/14  2:23 PM  Result Value Ref Range   Unit Number [id number]    Blood Component Type THAWED PLASMA    Unit division 00    Status of Unit ISSUED,FINAL    Transfusion Status OK TO TRANSFUSE   Glucose, capillary     Status: Abnormal   Collection Time: 12/15/14  3:56 PM  Result Value Ref Range   Glucose-Capillary 142 (H) 65 - 99 mg/dL  CBC     Status: Abnormal   Collection Time: 12/15/14  4:00 PM  Result Value Ref Range   WBC 6.4 4.0 - 10.5 K/uL   RBC 2.61 (L) 4.22 - 5.81 MIL/uL   Hemoglobin 7.7 (L) 13.0 - 17.0 g/dL   HCT 23.3 (L) 39.0 - 52.0 %   MCV 89.3 78.0 - 100.0 fL    MCH 29.5 26.0 - 34.0 pg   MCHC 33.0 30.0 - 36.0 g/dL   RDW 21.9 (H) 11.5 - 15.5 %   Platelets 66 (L) 150 - 400 K/uL  Prepare fresh frozen plasma     Status: None   Collection Time: 12/15/14  4:32 PM  Result Value Ref Range   Unit Number [id number]    Blood Component Type THAWED PLASMA    Unit division 00    Status of Unit ISSUED,FINAL    Transfusion Status OK TO TRANSFUSE   Prepare RBC     Status: None   Collection Time: 12/15/14  5:52 PM  Result Value Ref Range   Order Confirmation ORDER PROCESSED BY BLOOD BANK   Sodium, urine, random     Status: None   Collection Time: 12/15/14  7:00 PM  Result Value Ref Range   Sodium, Ur 97 mmol/L  Glucose, capillary     Status: Abnormal   Collection Time: 12/15/14  8:24 PM  Result Value Ref Range   Glucose-Capillary 138 (H) 65 - 99 mg/dL  CBC  Status: Abnormal   Collection Time: 12/15/14 11:42 PM  Result Value Ref Range   WBC 5.5 4.0 - 10.5 K/uL   RBC 2.47 (L) 4.22 - 5.81 MIL/uL   Hemoglobin 7.4 (L) 13.0 - 17.0 g/dL   HCT 22.0 (L) 39.0 - 52.0 %   MCV 89.1 78.0 - 100.0 fL   MCH 30.0 26.0 - 34.0 pg   MCHC 33.6 30.0 - 36.0 g/dL   RDW 20.4 (H) 11.5 - 15.5 %   Platelets 60 (L) 150 - 400 K/uL  Basic metabolic panel     Status: Abnormal   Collection Time: 12/15/14 11:42 PM  Result Value Ref Range   Sodium 136 135 - 145 mmol/L   Potassium 4.6 3.5 - 5.1 mmol/L   Chloride 106 101 - 111 mmol/L   CO2 20 (L) 22 - 32 mmol/L   Glucose, Bld 147 (H) 65 - 99 mg/dL   BUN 49 (H) 6 - 20 mg/dL   Creatinine, Ser 2.29 (H) 0.61 - 1.24 mg/dL   Calcium 8.2 (L) 8.9 - 10.3 mg/dL   GFR calc non Af Amer 30 (L) >60 mL/min   GFR calc Af Amer 34 (L) >60 mL/min   Anion gap 10 5 - 15  Glucose, capillary     Status: Abnormal   Collection Time: 12/16/14 12:09 AM  Result Value Ref Range   Glucose-Capillary 134 (H) 65 - 99 mg/dL  Glucose, capillary     Status: Abnormal   Collection Time: 12/16/14  4:01 AM  Result Value Ref Range   Glucose-Capillary 124  (H) 65 - 99 mg/dL  Comprehensive metabolic panel     Status: Abnormal   Collection Time: 12/16/14  5:21 AM  Result Value Ref Range   Sodium 133 (L) 135 - 145 mmol/L   Potassium 4.1 3.5 - 5.1 mmol/L   Chloride 104 101 - 111 mmol/L   CO2 22 22 - 32 mmol/L   Glucose, Bld 125 (H) 65 - 99 mg/dL   BUN 49 (H) 6 - 20 mg/dL   Creatinine, Ser 2.19 (H) 0.61 - 1.24 mg/dL   Calcium 7.9 (L) 8.9 - 10.3 mg/dL   Total Protein 5.1 (L) 6.5 - 8.1 g/dL   Albumin 2.1 (L) 3.5 - 5.0 g/dL   AST 68 (H) 15 - 41 U/L   ALT 34 17 - 63 U/L   Alkaline Phosphatase 86 38 - 126 U/L   Total Bilirubin 3.8 (H) 0.3 - 1.2 mg/dL   GFR calc non Af Amer 31 (L) >60 mL/min   GFR calc Af Amer 36 (L) >60 mL/min   Anion gap 7 5 - 15  CBC with Differential/Platelet     Status: Abnormal   Collection Time: 12/16/14  5:21 AM  Result Value Ref Range   WBC 5.2 4.0 - 10.5 K/uL   RBC 2.37 (L) 4.22 - 5.81 MIL/uL   Hemoglobin 7.0 (L) 13.0 - 17.0 g/dL   HCT 21.1 (L) 39.0 - 52.0 %   MCV 89.0 78.0 - 100.0 fL   MCH 29.5 26.0 - 34.0 pg   MCHC 33.2 30.0 - 36.0 g/dL   RDW 20.8 (H) 11.5 - 15.5 %   Platelets 55 (L) 150 - 400 K/uL   Neutrophils Relative % 64 43 - 77 %   Neutro Abs 3.3 1.7 - 7.7 K/uL   Lymphocytes Relative 16 12 - 46 %   Lymphs Abs 0.8 0.7 - 4.0 K/uL   Monocytes Relative 12 3 - 12 %  Monocytes Absolute 0.6 0.1 - 1.0 K/uL   Eosinophils Relative 8 (H) 0 - 5 %   Eosinophils Absolute 0.4 0.0 - 0.7 K/uL   Basophils Relative 1 0 - 1 %   Basophils Absolute 0.0 0.0 - 0.1 K/uL  Procalcitonin     Status: None   Collection Time: 12/16/14  5:21 AM  Result Value Ref Range   Procalcitonin <0.10 ng/mL  Glucose, capillary     Status: Abnormal   Collection Time: 12/16/14  8:08 AM  Result Value Ref Range   Glucose-Capillary 106 (H) 65 - 99 mg/dL    Studies/Results: Ct Head Wo Contrast  12/14/2014   CLINICAL DATA:  Patient found unresponsive today. Initial encounter.  EXAM: CT HEAD WITHOUT CONTRAST  CT CERVICAL SPINE WITHOUT  CONTRAST  TECHNIQUE: Multidetector CT imaging of the head and cervical spine was performed following the standard protocol without intravenous contrast. Multiplanar CT image reconstructions of the cervical spine were also generated.  COMPARISON:  Head CT scan 11/10/2014. Head and cervical spine CT scans 08/29/2014.  FINDINGS: CT HEAD FINDINGS  There is advanced for age appearing cortical atrophy. There is also some chronic microvascular ischemic change. No evidence of acute abnormality including hemorrhage, infarct, mass lesion, mass effect, midline shift or abnormal extra-axial fluid collection is identified. The calvarium is intact. Imaged paranasal sinuses and mastoid air cells are clear.  CT CERVICAL SPINE FINDINGS  There is no fracture or malalignment of the cervical spine. Endotracheal tube and OG tube are in place. Intervertebral disc space height is maintained. Lung apices are clear.  IMPRESSION: No acute abnormality head or cervical spine.  Atrophy and chronic microvascular ischemic change.   Electronically Signed   By: Inge Rise M.D.   On: 12/14/2014 10:02   Ct Cervical Spine Wo Contrast  12/14/2014   CLINICAL DATA:  Patient found unresponsive today. Initial encounter.  EXAM: CT HEAD WITHOUT CONTRAST  CT CERVICAL SPINE WITHOUT CONTRAST  TECHNIQUE: Multidetector CT imaging of the head and cervical spine was performed following the standard protocol without intravenous contrast. Multiplanar CT image reconstructions of the cervical spine were also generated.  COMPARISON:  Head CT scan 11/10/2014. Head and cervical spine CT scans 08/29/2014.  FINDINGS: CT HEAD FINDINGS  There is advanced for age appearing cortical atrophy. There is also some chronic microvascular ischemic change. No evidence of acute abnormality including hemorrhage, infarct, mass lesion, mass effect, midline shift or abnormal extra-axial fluid collection is identified. The calvarium is intact. Imaged paranasal sinuses and mastoid  air cells are clear.  CT CERVICAL SPINE FINDINGS  There is no fracture or malalignment of the cervical spine. Endotracheal tube and OG tube are in place. Intervertebral disc space height is maintained. Lung apices are clear.  IMPRESSION: No acute abnormality head or cervical spine.  Atrophy and chronic microvascular ischemic change.   Electronically Signed   By: Inge Rise M.D.   On: 12/14/2014 10:02   Dg Chest Port 1 View  12/16/2014   CLINICAL DATA:  Acute respiratory failure  EXAM: PORTABLE CHEST - 1 VIEW  COMPARISON:  Portable chest x-ray of December 14, 2014  FINDINGS: The lungs are better inflated today. The interstitial markings remain mildly increased but have improved. There is no pleural effusion or pneumothorax. The cardiac silhouette is enlarged. The central pulmonary vascularity is prominent. The endotracheal tube tip lies 4.2 cm above the carina. The esophagogastric tube tip projects below the inferior margin of the image. The bony thorax exhibits no acute abnormality.  IMPRESSION: Interval improvement in the aeration of both lungs with decreased interstitial edema. Mild interstitial edema persists likely secondary to CHF. The support tubes are in reasonable position today.   Electronically Signed   By: David  Martinique M.D.   On: 12/16/2014 08:13   Dg Chest Portable 1 View  12/14/2014   CLINICAL DATA:  Patient unresponsive.  Status post intubation.  EXAM: PORTABLE CHEST - 1 VIEW  COMPARISON:  PA and lateral chest 12/09/2014.  FINDINGS: Endotracheal tube is in place with the tip approximately 0.5 cm above the carina. Recommend withdrawal of 1.5-2 cm. NG tube courses into the stomach and below the inferior margin of film. Lung volumes are low with basilar atelectasis. Heart size is normal.  IMPRESSION: ETT tip projects 0.5 cm above the carina. Recommend withdrawal of 1.5-2 cm.  Basilar atelectasis in a low volume chest.  Critical Value/emergent results were called by telephone at the time of  interpretation on 12/14/2014 at 9:31 am to Dr. Ezequiel Essex , who verbally acknowledged these results.   Electronically Signed   By: Inge Rise M.D.   On: 12/14/2014 09:32      1. Assessment: 1. Hepatic encephalopathy 2. GI bleeding with minimal findings on EGD yesterday, may be partly related to OG suction trauma and portal gastropathy, varices not felt to be bleeding 3. Alcoholic cirrhosis  Plan: 1. Continue lactulose and xifaxin 2. Continue Protonix and octreotide 3. FFP as needed 4. Will transfuse 1 more unit of packed red blood cells 5. Wean from the ventilator when tolerated    Sebastiano Luecke C 12/16/2014, 9:25 AM  Pager [phone] If no answer or after 5 PM call [phone]

## 2014-12-16 NOTE — Clinical Documentation Improvement (Signed)
  Please specify the stage of this patient's Chronic Kidney Disease if possible.  CKD Stage I - GFR > or = 90 CKD Stage II - GFR 60-80 CKD Stage III - GFR 30-59 CKD Stage IV - GFR 15-29 CKD Stage V - GFR < 15  H&P: " Acute on chronic renal failure." GFR= 32  Thank You!  Pryor Montes, BSN, RN [place] HIM/Clinical Documentation Specialist [email or web address] [phone]

## 2014-12-16 NOTE — Progress Notes (Signed)
Pt extubated per Dr. Titus Mould order. Dr. Amedeo Plenty requested to leave OGT in instead of replacing with NGT due topt's critical condition. OGT remains at [facility] and secured with tape to Left side. Dr. Amedeo Plenty request to monitor overnight and reevaluate the need to continue to [facility] VS discontinuing.   Babs Bertin RN

## 2014-12-16 NOTE — Procedures (Signed)
Extubation Procedure Note  Patient Details:   Name: [name] DOB: 1956/05/13 MRN: [id number]   Airway Documentation:  Airway 8 mm (Active)  Secured at (cm) 22 cm 12/16/2014  8:32 AM  Measured From Lips 12/16/2014  8:32 AM  [place] 12/16/2014  8:32 AM  Secured By [employer] 12/16/2014  8:32 AM  Tube Holder Repositioned Yes 12/16/2014  8:32 AM  Cuff Pressure (cm H2O) 26 cm H2O 12/15/2014 11:25 AM  Site Condition Dry 12/16/2014  3:58 AM    Evaluation  O2 sats: stable throughout and currently acceptable Complications: No apparent complications Patient did tolerate procedure well. Bilateral Breath Sounds: Clear Suctioning: Airway Yes   Prior to extubation pt suctioned orally, via subglottic tube, and ETT.  Positive cuff leak noted.  Post-extubation: pt able to cough to produce secretions, state his name, no stridor noted.  [name] 12/16/2014, 10:27 AM

## 2014-12-16 NOTE — Progress Notes (Signed)
PULMONARY / CRITICAL CARE MEDICINE   Name: [name] MRN: [id number] DOB: 01-08-1956    ADMISSION DATE:  12/14/2014 CONSULTATION DATE:  7/25 ADMISSION DATE: 12/14/2014  REFERRING MD : EDP   CHIEF COMPLAINT: Altered mental status  INITIAL PRESENTATION: 59 yo male with ETOH cirrhosis, portal HTN with varices and ascites found unresponsive at home covered in vomit. Intubated in ER on . PCCM admitted  59yo male with PMH of alcoholic cirrhosis, ascites, recurrent hospitalizations for hepatic encephalopathy, diastolic HF, type 2 diabetes, hx of cocaine and alcohol abuse. He was recently discharged from the medicine service (on 12/05/14) from GI bleed due to esophageal varices. He had an EGD on 7/10 that showed portal gastropathy and varices with no hemorrhage, and bleeding spontaneously resolved.   On 7/24 patient was found unresponsive at home with non-bloody vomiting. He was admitted to the ICU from the ED after being intubated. Head CT was negative, paracentesis was negative for SBP, so due to his high ammonia levels, and unreponsiveness on vent, patient is being treated for hepatic encephalopathy and liver failure symptoms with octreotide, protonix, lactulose, and xifaxin.   STUDIES:  7/24 CT head and C spine - chronic atrophy and microvascular ischemia, no acute process   SIGNIFICANT EVENTS/STUDIES: 7/24 found unresponsive at home, ER  7/24 paracentesis - negative for SBP  SUBJECTIVE:  Intubated on vent EGD for concern of GI bleed overnight, no active bleed seen  VITAL SIGNS: Temp:  [94.4 F (34.7 C)-99.5 F (37.5 C)] 98.9 F (37.2 C) (07/26 0600) Pulse Rate:  [73-107] 73 (07/26 0600) Resp:  [9-30] 16 (07/26 0600) BP: (113-171)/(44-82) 113/44 mmHg (07/26 0600) SpO2:  [100 %] 100 % (07/26 0600) FiO2 (%):  [40 %] 40 % (07/26 0400) Weight:  [229 lb 8 oz (104.1 kg)] 229 lb 8 oz (104.1 kg) (07/26 0437) HEMODYNAMICS:   VENTILATOR SETTINGS: Vent Mode:  [-] PRVC FiO2 (%):   [40 %] 40 % Set Rate:  [16 bmp] 16 bmp Vt Set:  [600 mL] 600 mL PEEP:  [5 cmH20] 5 cmH20 Pressure Support:  [5 cmH20] 5 cmH20 Plateau Pressure:  [8 cmH20] 8 cmH20 INTAKE / OUTPUT:  Intake/Output Summary (Last 24 hours) at 12/16/14 0738 Last data filed at 12/16/14 0600  Gross per 24 hour  Intake 2358.08 ml  Output   1980 ml  Net 378.08 ml    PHYSICAL EXAMINATION: General:  NAD Neuro:  Alert, follows commands HEENT:  ETT, c-collar in place, awake, alert, no pain on neck palpation Cardiovascular: RRR, b/l 2+ LE edema, UE edema Lungs:  Coarse equal breath sounds throughout Abdomen:  Non tender, moderately distended, + BS Musculoskeletal:  No gross deformities Skin:  Purpura on upper and lower extremities, else intact  LABS:  CBC  Recent Labs Lab 12/15/14 1600 12/15/14 2342 12/16/14 0521  WBC 6.4 5.5 5.2  HGB 7.7* 7.4* 7.0*  HCT 23.3* 22.0* 21.1*  PLT 66* 60* 55*   Coag's  Recent Labs Lab 12/09/14 1129 12/14/14 0909 12/15/14 0510  INR 1.50* 1.67* 1.66*   BMET  Recent Labs Lab 12/15/14 0510 12/15/14 2342 12/16/14 0521  NA 135 136 133*  K 5.3* 4.6 4.1  CL 107 106 104  CO2 21* 20* 22  BUN 46* 49* 49*  CREATININE 2.41* 2.29* 2.19*  GLUCOSE 135* 147* 125*   Electrolytes  Recent Labs Lab 12/15/14 0510 12/15/14 2342 12/16/14 0521  CALCIUM 7.9* 8.2* 7.9*  MG 2.4  --   --   PHOS 4.9*  --   --  Sepsis Markers  Recent Labs Lab 12/14/14 0917 12/14/14 1305 12/14/14 1600 12/16/14 0521  LATICACIDVEN 2.93* 3.02*  --   --   PROCALCITON  --   --  <0.10 <0.10   ABG  Recent Labs Lab 12/14/14 1015 12/14/14 1826  PHART 7.447 7.484*  PCO2ART 32.7* 25.8*  PO2ART 405.0* 183*   Liver Enzymes  Recent Labs Lab 12/14/14 0909 12/15/14 0510 12/16/14 0521  AST 84* 80* 68*  ALT 39 40 34  ALKPHOS 112 103 86  BILITOT 2.2* 4.7* 3.8*  ALBUMIN 2.2* 2.1* 2.1*   Cardiac Enzymes  Recent Labs Lab 12/14/14 1600 12/14/14 2043 12/15/14 0130   TROPONINI <0.03 <0.03 <0.03   Glucose  Recent Labs Lab 12/15/14 0819 12/15/14 1159 12/15/14 1556 12/15/14 2024 12/16/14 0009 12/16/14 0401  GLUCAP 112* 136* 142* 138* 134* 124*    Imaging No results found.   ASSESSMENT / PLAN:  PULMONARY OETT 7/24 >> A: Acute Respiratory Failure secondary to inability to protect airway  At risk for aspiration PNA although no evidence so far Low lung volumes and bibasilar atelectasis by CXR 7/24 P:  -Follow ABG noted, keep same MV -VAP precautions -Assess for Daily SBT/weaning- neurologically improved consider extubation today 7/26 - Follow AM CXR -cpap 5 ps 5, ps increase if needed -lasix, does have some int prominence  CARDIOVASCULAR A:  Diastolic CHF (Echo 0/0174 w/ EF 94%, grade 1 diastolic dysfunction)  Normal BNP P:  -Normal BNP, likely not heart failure exacerbation component -BB on hold for now  -lasix IV 40 tid   RENAL A:  Acute on chronic renal failure (1.47 baseline, 2.41 7/25 ) - improving to 2.19 7/26 Etiology: ATN, hepatorenal Volume overloaded P:  -Fluid restriction -lasix, consider increase to fully neg balance -dc spironolactone for potassium  GASTROINTESTINAL NG tube 4/96 >> A:  Alcoholic Cirrhosis  Ascites, negative for SBP Hx varices and GIB, no evidence at this time.  Mild AST elevation Concern for GI bleed overnight- EGD neg for bleed active, does have varices  P:  -Follow LFT  -Consider another paracentesis for symptomatic relief, may require -Cont Xifaxan per tube  -Cont octreotide per GI -PPI - consider TF if shows so more s/s of GI bleed  HEMATOLOGIC A:  Chronic Thrombocytopenia -plt 75 Hypercoaguable w/ chronic liver dz -INR 1.66 Chronic Anemia with hepatic gastropathy/varices - EGD 7/25 showed no active bleed S/p 2 units of blood P:  -Transfuse for hgb <7 -Continue trending CBC  -Trend INR -SCD (no heparin) -S/p T&S -lasix , re assess hct in am    INFECTIOUS A:  No clear evidence of sepsis At risk for SBP, at risk for aspiration pneumonia with no signs of active infection Lactate 3.02, negative procalcitonin P:  BC x2 7/24 >> UC 7/24 >> Peritoneal CX 7/24 >> negative for SBP  Abx:  Ceftazidime (empiric) 7/24>>>7/26 PCT continues to be negative  ENDOCRINE A:  Type 2 DM P:  -CBGs while on octreotide -SSI   NEUROLOGIC A:  Hepatic encephalopathy  High Ammonia level  CT head /C spine neg for acute process P:  -Remove CT collar as clinically cleared and CT -RASS goal: 0 -Continue Lactulose to 4 BM day, cont rifax -D/c Diprivan  -Fent versed pushes as needed   FAMILY  - Updates: None at bedside  Alyssa A. Lincoln Brigham MD, [place] Resident PGY-2 Pager [phone]   12/16/2014, 7:38 AM   STAFF NOTE: Linwood Dibbles, MD FACP have personally reviewed patient's available data, including  medical history, events of note, physical examination and test results as part of my evaluation. I have discussed with resident/NP and other care providers such as pharmacist, RN and RRT. In addition, I personally evaluated patient and elicited key findings of: awake, alert, no pain neck, improved overall, EGD reviewed, cbc to follow, continued octreotide /ppi as likely source was upper, wean topday aggressive cpap5 ps 5, goal 30 min , re assess coags, NOP SBP noted, dc abx The patient is critically ill with multiple organ systems failure and requires high complexity decision making for assessment and support, frequent evaluation and titration of therapies, application of advanced monitoring technologies and extensive interpretation of multiple databases.   Critical Care Time devoted to patient care services described in this note is30 Minutes. This time reflects time of care of this signee: Merrie Roof, MD FACP. This critical care time does not reflect procedure time, or teaching time or supervisory time of  PA/NP/Med student/Med Resident etc but could involve care discussion time. Rest per NP/medical resident whose note is outlined above and that I agree with   Lavon Paganini. Titus Mould, MD, [place] Pgr: [facility] 12/16/2014 9:26 AM

## 2014-12-17 DIAGNOSIS — K7460 Unspecified cirrhosis of liver: Secondary | ICD-10-CM

## 2014-12-17 LAB — GLUCOSE, CAPILLARY
GLUCOSE-CAPILLARY: 157 mg/dL — AB (ref 65–99)
GLUCOSE-CAPILLARY: 96 mg/dL (ref 65–99)
GLUCOSE-CAPILLARY: 99 mg/dL (ref 65–99)
Glucose-Capillary: 110 mg/dL — ABNORMAL HIGH (ref 65–99)
Glucose-Capillary: 130 mg/dL — ABNORMAL HIGH (ref 65–99)
Glucose-Capillary: 168 mg/dL — ABNORMAL HIGH (ref 65–99)

## 2014-12-17 LAB — CBC
HCT: 26.1 % — ABNORMAL LOW (ref 39.0–52.0)
HCT: 23.6 % — ABNORMAL LOW (ref 39.0–52.0)
HCT: 27.9 % — ABNORMAL LOW (ref 39.0–52.0)
HEMATOCRIT: 27.1 % — AB (ref 39.0–52.0)
HEMOGLOBIN: 8.6 g/dL — AB (ref 13.0–17.0)
HEMOGLOBIN: 9.2 g/dL — AB (ref 13.0–17.0)
Hemoglobin: 7.8 g/dL — ABNORMAL LOW (ref 13.0–17.0)
Hemoglobin: 9.3 g/dL — ABNORMAL LOW (ref 13.0–17.0)
MCH: 29.9 pg (ref 26.0–34.0)
MCH: 29.7 pg (ref 26.0–34.0)
MCH: 29.8 pg (ref 26.0–34.0)
MCH: 30.2 pg (ref 26.0–34.0)
MCHC: 33 g/dL (ref 30.0–36.0)
MCHC: 33.1 g/dL (ref 30.0–36.0)
MCHC: 33.3 g/dL (ref 30.0–36.0)
MCHC: 33.9 g/dL (ref 30.0–36.0)
MCV: 90.6 fL (ref 78.0–100.0)
MCV: 89.7 fL (ref 78.0–100.0)
MCV: 89.4 fL (ref 78.0–100.0)
MCV: 88.9 fL (ref 78.0–100.0)
PLATELETS: 50 10*3/uL — AB (ref 150–400)
PLATELETS: 43 10*3/uL — AB (ref 150–400)
Platelets: 45 10*3/uL — ABNORMAL LOW (ref 150–400)
Platelets: 42 10*3/uL — ABNORMAL LOW (ref 150–400)
RBC: 2.88 MIL/uL — ABNORMAL LOW (ref 4.22–5.81)
RBC: 2.63 MIL/uL — ABNORMAL LOW (ref 4.22–5.81)
RBC: 3.12 MIL/uL — ABNORMAL LOW (ref 4.22–5.81)
RBC: 3.05 MIL/uL — ABNORMAL LOW (ref 4.22–5.81)
RDW: 20 % — AB (ref 11.5–15.5)
RDW: 19.8 % — ABNORMAL HIGH (ref 11.5–15.5)
RDW: 19 % — AB (ref 11.5–15.5)
RDW: 19.1 % — ABNORMAL HIGH (ref 11.5–15.5)
WBC: 4.8 10*3/uL (ref 4.0–10.5)
WBC: 3.8 10*3/uL — ABNORMAL LOW (ref 4.0–10.5)
WBC: 4.1 10*3/uL (ref 4.0–10.5)
WBC: 4.1 10*3/uL (ref 4.0–10.5)

## 2014-12-17 LAB — PROTIME-INR
INR: 1.53 — ABNORMAL HIGH (ref 0.00–1.49)
PROTHROMBIN TIME: 18.5 s — AB (ref 11.6–15.2)

## 2014-12-17 LAB — COMPREHENSIVE METABOLIC PANEL
ALK PHOS: 88 U/L (ref 38–126)
ALT: 33 U/L (ref 17–63)
AST: 64 U/L — ABNORMAL HIGH (ref 15–41)
Albumin: 2.1 g/dL — ABNORMAL LOW (ref 3.5–5.0)
Anion gap: 8 (ref 5–15)
BILIRUBIN TOTAL: 5.7 mg/dL — AB (ref 0.3–1.2)
BUN: 49 mg/dL — AB (ref 6–20)
CHLORIDE: 107 mmol/L (ref 101–111)
CO2: 22 mmol/L (ref 22–32)
Calcium: 8.1 mg/dL — ABNORMAL LOW (ref 8.9–10.3)
Creatinine, Ser: 1.72 mg/dL — ABNORMAL HIGH (ref 0.61–1.24)
GFR calc Af Amer: 49 mL/min — ABNORMAL LOW (ref >60)
GFR calc non Af Amer: 42 mL/min — ABNORMAL LOW (ref >60)
Glucose, Bld: 116 mg/dL — ABNORMAL HIGH (ref 65–99)
Potassium: 3.8 mmol/L (ref 3.5–5.1)
Sodium: 137 mmol/L (ref 135–145)
Total Protein: 5.2 g/dL — ABNORMAL LOW (ref 6.5–8.1)

## 2014-12-17 LAB — PROCALCITONIN: Procalcitonin: <0.1 ng/mL

## 2014-12-17 LAB — PREPARE RBC (CROSSMATCH)

## 2014-12-17 MED ORDER — BOOST / RESOURCE BREEZE PO LIQD
1.0000 | Freq: Three times a day (TID) | ORAL | Status: DC
  Administered 2014-12-17 – 2014-12-20 (×8): 1 via ORAL

## 2014-12-17 MED ORDER — FUROSEMIDE 10 MG/ML IJ SOLN
40.0000 mg | Freq: Four times a day (QID) | INTRAMUSCULAR | Status: DC
  Administered 2014-12-17 – 2014-12-20 (×11): 40 mg via INTRAVENOUS
  Filled 2014-12-17 (×11): qty 4

## 2014-12-17 MED ORDER — SODIUM CHLORIDE 0.9 % IV SOLN
Freq: Once | INTRAVENOUS | Status: AC
  Administered 2014-12-17: 11:00:00 via INTRAVENOUS

## 2014-12-17 MED ORDER — PANTOPRAZOLE SODIUM 40 MG PO TBEC
40.0000 mg | DELAYED_RELEASE_TABLET | Freq: Two times a day (BID) | ORAL | Status: DC
  Administered 2014-12-17 – 2014-12-20 (×7): 40 mg via ORAL
  Filled 2014-12-17 (×7): qty 1

## 2014-12-17 NOTE — Care Management Important Message (Signed)
Important Message  Patient Details  Name: LASH MATULICH MRN: [id number] Date of Birth: 1955-06-18   Medicare Important Message Given:  Yes-second notification given    Nathen May 12/17/2014, 12:17 [place] Message  Patient Details  Name: BROOK MALL MRN: [id number] Date of Birth: [birth year]   Medicare Important Message Given:  Yes-second notification given    Nathen May 12/17/2014, 12:17 PM

## 2014-12-17 NOTE — Progress Notes (Signed)
CRITICAL VALUE ALERT  Critical value received:  Paracentisis culture Positive   Date of notification:  12/17/2014  Time of notification:  1412  Critical value read back yes Nurse who received alert:  Robet Leu  MD notified (1st page):  [place]   Time of first page:  14:22  MD notified (2nd page):  Time of second page:  Responding MD:    Time MD responded:

## 2014-12-17 NOTE — Progress Notes (Signed)
PULMONARY / CRITICAL CARE MEDICINE   Name: [name] MRN: [id number] DOB: February 21, 1956    ADMISSION DATE:  12/14/2014 CONSULTATION DATE:  7/25 ADMISSION DATE: 12/14/2014  REFERRING MD : EDP   CHIEF COMPLAINT: Altered mental status  INITIAL PRESENTATION: 59 yo male with ETOH cirrhosis, portal HTN with varices and ascites found unresponsive at home covered in vomit. Intubated in ER on . PCCM admitted  59yo male with PMH of alcoholic cirrhosis, ascites, recurrent hospitalizations for hepatic encephalopathy, diastolic HF, type 2 diabetes, hx of cocaine and alcohol abuse. He was recently discharged from the medicine service (on 12/05/14) from GI bleed due to esophageal varices. He had an EGD on 7/10 that showed portal gastropathy and varices with no hemorrhage, and bleeding spontaneously resolved.   On 7/24 patient was found unresponsive at home with non-bloody vomiting. He was admitted to the ICU from the ED after being intubated. Head CT was negative, paracentesis was negative for SBP, so due to his high ammonia levels, and unreponsiveness on vent, patient is being treated for hepatic encephalopathy and liver failure symptoms with octreotide, protonix, lactulose, and xifaxin.   STUDIES:  7/24 CT head and C spine - chronic atrophy and microvascular ischemia, no acute process   SIGNIFICANT EVENTS/STUDIES: 7/24 found unresponsive at home, ER  7/24 paracentesis - negative for SBP  SUBJECTIVE:  NAE overnight Lasix with neg balance  VITAL SIGNS: Temp:  [97.8 F (36.6 C)-98.4 F (36.9 C)] 97.9 F (36.6 C) (07/27 0600) Pulse Rate:  [64-89] 66 (07/27 0600) Resp:  [7-18] 13 (07/27 0600) BP: (110-138)/(47-85) 127/65 mmHg (07/27 0600) SpO2:  [100 %] 100 % (07/27 0600) FiO2 (%):  [40 %] 40 % (07/26 1000) Weight:  [218 lb 0.6 oz (98.9 kg)] 218 lb 0.6 oz (98.9 kg) (07/27 0500) HEMODYNAMICS:   VENTILATOR SETTINGS: Vent Mode:  [-] PSV;CPAP FiO2 (%):  [40 %] 40 % PEEP:  [5 cmH20] 5  cmH20 Pressure Support:  [5 cmH20] 5 cmH20 Plateau Pressure:  [10 cmH20] 10 cmH20 INTAKE / OUTPUT:  Intake/Output Summary (Last 24 hours) at 12/17/14 0823 Last data filed at 12/17/14 0600  Gross per 24 hour  Intake   1753 ml  Output   4925 ml  Net  -3172 ml    PHYSICAL EXAMINATION: General:  NAD Neuro:  Alert, AO x3 HEENT:   awake, Cardiovascular: RRR, b/l 2+ LE edema,  Lungs:  Coarse equal breath sounds throughout Abdomen:  Non tender, moderately distended, + BS Musculoskeletal:  No gross deformities Skin:  Purpura on upper and lower extremities, else intact  LABS:  CBC  Recent Labs Lab 12/16/14 0521 12/16/14 1610 12/17/14 0235  WBC 5.2 4.2 4.8  HGB 7.0* 7.8* 8.6*  HCT 21.1* 23.5* 26.1*  PLT 55* 49* 50*   Coag's  Recent Labs Lab 12/14/14 0909 12/15/14 0510 12/16/14 1610  INR 1.67* 1.66* 1.59*   BMET  Recent Labs Lab 12/15/14 2342 12/16/14 0521 12/17/14 0235  NA 136 133* 137  K 4.6 4.1 3.8  CL 106 104 107  CO2 20* 22 22  BUN 49* 49* 49*  CREATININE 2.29* 2.19* 1.72*  GLUCOSE 147* 125* 116*   Electrolytes  Recent Labs Lab 12/15/14 0510 12/15/14 2342 12/16/14 0521 12/17/14 0235  CALCIUM 7.9* 8.2* 7.9* 8.1*  MG 2.4  --   --   --   PHOS 4.9*  --   --   --    Sepsis Markers  Recent Labs Lab 12/14/14 0917 12/14/14 1305 12/14/14 1600 12/16/14  2683 12/17/14 0235  LATICACIDVEN 2.93* 3.02*  --   --   --   PROCALCITON  --   --  <0.10 <0.10 <0.10   ABG  Recent Labs Lab 12/14/14 1015 12/14/14 1826  PHART 7.447 7.484*  PCO2ART 32.7* 25.8*  PO2ART 405.0* 183*   Liver Enzymes  Recent Labs Lab 12/15/14 0510 12/16/14 0521 12/17/14 0235  AST 80* 68* 64*  ALT 40 34 33  ALKPHOS 103 86 88  BILITOT 4.7* 3.8* 5.7*  ALBUMIN 2.1* 2.1* 2.1*   Cardiac Enzymes  Recent Labs Lab 12/14/14 1600 12/14/14 2043 12/15/14 0130  TROPONINI <0.03 <0.03 <0.03   Glucose  Recent Labs Lab 12/16/14 0808 12/16/14 1245 12/16/14 1549  12/16/14 1956 12/17/14 0014 12/17/14 0354  GLUCAP 106* 124* 122* 124* 99 96    Imaging No results found.   ASSESSMENT / PLAN:  PULMONARY OETT 7/24 >> A: Acute Respiratory Failure secondary to inability to protect airway  At risk for aspiration PNA although no evidence so far Low lung volumes and bibasilar atelectasis by CXR 7/24- improved on CXR 7/26 P:  -cont lasix, some improvement in CXR -IS  CARDIOVASCULAR A:  Diastolic CHF (Echo 08/1960 w/ EF 22%, grade 1 diastolic dysfunction)  Normal BNP P:  -Normal BNP, likely not heart failure exacerbation component -BB on hold for now  -lasix IV 40 tid , tolerated  RENAL A:  Acute on chronic renal failure (1.47 baseline, 2.41 7/25 ) - improving to 2.19 7/26 Etiology: ATN, hepatorenal Volume overloaded improving P:  -Fluid restriction -lasix, slight reduction -dc spironolactone for potassium  GASTROINTESTINAL NG tube 9/79 >> A:  Alcoholic Cirrhosis  Ascites, negative for SBP Hx varices and GIB, no evidence at this time.  Mild AST elevation Concern for GI bleed- stable Hct, likely secondary to irritation from  NGT as ge does have gastropathy and esophageal varices P:  -Follow LFT  -Cont Xifaxan per tube  -Cont octreotide per GI - dc -PPI - GI following, appreciate recs - Start diet per GI - D/c NGT  -consider dc ppi drip to bid  HEMATOLOGIC A:  Chronic Thrombocytopenia -plt 45 7/27 Hypercoaguable w/ chronic liver dz -INR 1.59 7/26 Chronic Anemia with hepatic gastropathy/varices - EGD 7/25 showed no active bleed S/p 2 units of blood P:  -Transfuse for hgb <7 -Continue trending CBC  -Trend INR pre dc from hospital, may need repeat vit K  -SCD (no heparin for now) -S/p T&S -lasix   INFECTIOUS A:  No clear evidence of sepsis At risk for SBP, at risk for aspiration pneumonia with no signs of active infection Lactate 3.02, negative procalcitonin P:  BC x2 7/24 >> UC 7/24  >> Peritoneal CX 7/24 >> negative for SBP  Abx:  Ceftazidime (empiric) 7/24>>>7/26 PCT continues to be negative  ENDOCRINE A:  Type 2 DM P:  -CBGs while on octreotide -SSI   NEUROLOGIC A:  Hepatic encephalopathy - improved High Ammonia level  CT head /C spine neg for acute process P:  -RASS goal: 0 -Continue Lactulose to 4 BM day, cont rifax  FAMILY  - Updates: None at bedside  Alyssa A. Lincoln Brigham MD, [facility] Resident PGY-2 Pager [phone]   STAFF NOTE: I, Merrie Roof, MD FACP have personally reviewed patient's available data, including medical history, events of note, physical examination and test results as part of my evaluation. I have discussed with resident/NP and other care providers such as pharmacist, RN and RRT. In addition, I personally evaluated patient and  elicited key findings of: more awake, cooperative, extubated, no distress, lasix reduction, maintain lactulose, dc octreotide, ppi to bid, to med floor, triad   Lavon Paganini. Titus Mould, MD, [place] Pgr: [facility] 12/17/2014 11:38 AM

## 2014-12-17 NOTE — Progress Notes (Signed)
[facility] Progress Note  Subjective: Still having bloody fluid coming through his OG tube with green stool and his flexes seal. Hemoglobin is down slightly from yesterday. He is alert and oriented  Objective: Vital signs in last 24 hours: Temp:  [97.8 F (36.6 C)-98.4 F (36.9 C)] 97.9 F (36.6 C) (07/27 0600) Pulse Rate:  [64-89] 66 (07/27 0600) Resp:  [7-18] 13 (07/27 0600) BP: (110-137)/(47-85) 127/65 mmHg (07/27 0600) SpO2:  [100 %] 100 % (07/27 0600) FiO2 (%):  [40 %] 40 % (07/26 1000) Weight:  [98.9 kg (218 lb 0.6 oz)] 98.9 kg (218 lb 0.6 oz) (07/27 0500) Weight change: -5.2 kg (-11 lb 7.4 oz)   PE: Alert complaining of shoulder pain abdomen soft  Lab Results: Results for orders placed or performed during the hospital encounter of 12/14/14 (from the past 24 hour(s))  Prepare RBC     Status: None   Collection Time: 12/16/14  9:32 AM  Result Value Ref Range   Order Confirmation ORDER PROCESSED BY BLOOD BANK   Glucose, capillary     Status: Abnormal   Collection Time: 12/16/14 12:45 PM  Result Value Ref Range   Glucose-Capillary 124 (H) 65 - 99 mg/dL  Glucose, capillary     Status: Abnormal   Collection Time: 12/16/14  3:49 PM  Result Value Ref Range   Glucose-Capillary 122 (H) 65 - 99 mg/dL  CBC     Status: Abnormal   Collection Time: 12/16/14  4:10 PM  Result Value Ref Range   WBC 4.2 4.0 - 10.5 K/uL   RBC 2.64 (L) 4.22 - 5.81 MIL/uL   Hemoglobin 7.8 (L) 13.0 - 17.0 g/dL   HCT 23.5 (L) 39.0 - 52.0 %   MCV 89.0 78.0 - 100.0 fL   MCH 29.5 26.0 - 34.0 pg   MCHC 33.2 30.0 - 36.0 g/dL   RDW 19.9 (H) 11.5 - 15.5 %   Platelets 49 (L) 150 - 400 K/uL  Protime-INR     Status: Abnormal   Collection Time: 12/16/14  4:10 PM  Result Value Ref Range   Prothrombin Time 19.0 (H) 11.6 - 15.2 seconds   INR 1.59 (H) 0.00 - 1.49  Glucose, capillary     Status: Abnormal   Collection Time: 12/16/14  7:56 PM  Result Value Ref Range   Glucose-Capillary 124 (H) 65 - 99  mg/dL  Glucose, capillary     Status: None   Collection Time: 12/17/14 12:14 AM  Result Value Ref Range   Glucose-Capillary 99 65 - 99 mg/dL  Procalcitonin     Status: None   Collection Time: 12/17/14  2:35 AM  Result Value Ref Range   Procalcitonin <0.10 ng/mL  Comprehensive metabolic panel     Status: Abnormal   Collection Time: 12/17/14  2:35 AM  Result Value Ref Range   Sodium 137 135 - 145 mmol/L   Potassium 3.8 3.5 - 5.1 mmol/L   Chloride 107 101 - 111 mmol/L   CO2 22 22 - 32 mmol/L   Glucose, Bld 116 (H) 65 - 99 mg/dL   BUN 49 (H) 6 - 20 mg/dL   Creatinine, Ser 1.72 (H) 0.61 - 1.24 mg/dL   Calcium 8.1 (L) 8.9 - 10.3 mg/dL   Total Protein 5.2 (L) 6.5 - 8.1 g/dL   Albumin 2.1 (L) 3.5 - 5.0 g/dL   AST 64 (H) 15 - 41 U/L   ALT 33 17 - 63 U/L   Alkaline Phosphatase 88 38 - 126  U/L   Total Bilirubin 5.7 (H) 0.3 - 1.2 mg/dL   GFR calc non Af Amer 42 (L) >60 mL/min   GFR calc Af Amer 49 (L) >60 mL/min   Anion gap 8 5 - 15  CBC     Status: Abnormal   Collection Time: 12/17/14  2:35 AM  Result Value Ref Range   WBC 4.8 4.0 - 10.5 K/uL   RBC 2.88 (L) 4.22 - 5.81 MIL/uL   Hemoglobin 8.6 (L) 13.0 - 17.0 g/dL   HCT 26.1 (L) 39.0 - 52.0 %   MCV 90.6 78.0 - 100.0 fL   MCH 29.9 26.0 - 34.0 pg   MCHC 33.0 30.0 - 36.0 g/dL   RDW 20.0 (H) 11.5 - 15.5 %   Platelets 50 (L) 150 - 400 K/uL  Glucose, capillary     Status: None   Collection Time: 12/17/14  3:54 AM  Result Value Ref Range   Glucose-Capillary 96 65 - 99 mg/dL  CBC     Status: Abnormal   Collection Time: 12/17/14  8:15 AM  Result Value Ref Range   WBC 3.8 (L) 4.0 - 10.5 K/uL   RBC 2.63 (L) 4.22 - 5.81 MIL/uL   Hemoglobin 7.8 (L) 13.0 - 17.0 g/dL   HCT 23.6 (L) 39.0 - 52.0 %   MCV 89.7 78.0 - 100.0 fL   MCH 29.7 26.0 - 34.0 pg   MCHC 33.1 30.0 - 36.0 g/dL   RDW 19.8 (H) 11.5 - 15.5 %   Platelets 45 (L) 150 - 400 K/uL    Studies/Results: Dg Chest Port 1 View  12/16/2014   CLINICAL DATA:  Acute respiratory failure   EXAM: PORTABLE CHEST - 1 VIEW  COMPARISON:  Portable chest x-ray of December 14, 2014  FINDINGS: The lungs are better inflated today. The interstitial markings remain mildly increased but have improved. There is no pleural effusion or pneumothorax. The cardiac silhouette is enlarged. The central pulmonary vascularity is prominent. The endotracheal tube tip lies 4.2 cm above the carina. The esophagogastric tube tip projects below the inferior margin of the image. The bony thorax exhibits no acute abnormality.  IMPRESSION: Interval improvement in the aeration of both lungs with decreased interstitial edema. Mild interstitial edema persists likely secondary to CHF. The support tubes are in reasonable position today.   Electronically Signed   By: David  Martinique M.D.   On: 12/16/2014 08:13      Assessment: 1. Hepatic encephalopathy extubated, 2. Ascites with no SBP 3. GI bleeding of unclear severity and location. Endoscopically no blood whatsoever in his upper GI tract 2 days ago despite orogastric tube blood which is continuing today 4.   Plan: 1. Perhaps paradoxically I would DC orogastric tube which may be irritating portal gastropathy and causing contact hemorrhage 2. However continue octreotide and Protonix 3. Will Gage GI bleeding based on presence or absence of hematemesis melanoma hematochezia, clinical stability and hemoglobin trend  4.  will go ahead and transfuse one more unit of packed red blood cells 5. Will start clear liquid diet 6. Continue lactulose and rifaximin orally     Gwenlyn Hottinger C 12/17/2014, 9:16 AM  Pager [phone] If no answer or after 5 PM call [phone]

## 2014-12-18 DIAGNOSIS — D696 Thrombocytopenia, unspecified: Secondary | ICD-10-CM

## 2014-12-18 DIAGNOSIS — K7290 Hepatic failure, unspecified without coma: Principal | ICD-10-CM

## 2014-12-18 DIAGNOSIS — J9600 Acute respiratory failure, unspecified whether with hypoxia or hypercapnia: Secondary | ICD-10-CM

## 2014-12-18 DIAGNOSIS — K7031 Alcoholic cirrhosis of liver with ascites: Secondary | ICD-10-CM

## 2014-12-18 LAB — TYPE AND SCREEN
ABO/RH(D): O POS
Antibody Screen: NEGATIVE
UNIT DIVISION: 0
UNIT DIVISION: 0
Unit division: 0
Unit division: 0

## 2014-12-18 LAB — CBC
HCT: 27.6 % — ABNORMAL LOW (ref 39.0–52.0)
HCT: 26.1 % — ABNORMAL LOW (ref 39.0–52.0)
HEMOGLOBIN: 9.3 g/dL — AB (ref 13.0–17.0)
Hemoglobin: 9 g/dL — ABNORMAL LOW (ref 13.0–17.0)
MCH: 29.9 pg (ref 26.0–34.0)
MCH: 30.6 pg (ref 26.0–34.0)
MCHC: 33.7 g/dL (ref 30.0–36.0)
MCHC: 34.5 g/dL (ref 30.0–36.0)
MCV: 88.7 fL (ref 78.0–100.0)
MCV: 88.8 fL (ref 78.0–100.0)
PLATELETS: 51 10*3/uL — AB (ref 150–400)
Platelets: 49 10*3/uL — ABNORMAL LOW (ref 150–400)
RBC: 3.11 MIL/uL — ABNORMAL LOW (ref 4.22–5.81)
RBC: 2.94 MIL/uL — AB (ref 4.22–5.81)
RDW: 19.1 % — ABNORMAL HIGH (ref 11.5–15.5)
RDW: 19 % — ABNORMAL HIGH (ref 11.5–15.5)
WBC: 4.1 10*3/uL (ref 4.0–10.5)
WBC: 4.3 10*3/uL (ref 4.0–10.5)

## 2014-12-18 LAB — COMPREHENSIVE METABOLIC PANEL
ALBUMIN: 2.1 g/dL — AB (ref 3.5–5.0)
ALT: 32 U/L (ref 17–63)
AST: 64 U/L — ABNORMAL HIGH (ref 15–41)
Alkaline Phosphatase: 94 U/L (ref 38–126)
Anion gap: 7 (ref 5–15)
BUN: 49 mg/dL — ABNORMAL HIGH (ref 6–20)
CALCIUM: 8 mg/dL — AB (ref 8.9–10.3)
CHLORIDE: 104 mmol/L (ref 101–111)
CO2: 24 mmol/L (ref 22–32)
Creatinine, Ser: 1.48 mg/dL — ABNORMAL HIGH (ref 0.61–1.24)
GFR calc Af Amer: 58 mL/min — ABNORMAL LOW (ref >60)
GFR calc non Af Amer: 50 mL/min — ABNORMAL LOW (ref >60)
Glucose, Bld: 111 mg/dL — ABNORMAL HIGH (ref 65–99)
POTASSIUM: 3.3 mmol/L — AB (ref 3.5–5.1)
Sodium: 135 mmol/L (ref 135–145)
TOTAL PROTEIN: 5.1 g/dL — AB (ref 6.5–8.1)
Total Bilirubin: 4.2 mg/dL — ABNORMAL HIGH (ref 0.3–1.2)

## 2014-12-18 LAB — GLUCOSE, CAPILLARY
GLUCOSE-CAPILLARY: 123 mg/dL — AB (ref 65–99)
GLUCOSE-CAPILLARY: 193 mg/dL — AB (ref 65–99)
GLUCOSE-CAPILLARY: 194 mg/dL — AB (ref 65–99)
GLUCOSE-CAPILLARY: 85 mg/dL (ref 65–99)
Glucose-Capillary: 96 mg/dL (ref 65–99)
Glucose-Capillary: 121 mg/dL — ABNORMAL HIGH (ref 65–99)

## 2014-12-18 LAB — PROTIME-INR
INR: 1.55 — ABNORMAL HIGH (ref 0.00–1.49)
Prothrombin Time: 18.6 seconds — ABNORMAL HIGH (ref 11.6–15.2)

## 2014-12-18 MED ORDER — FOLIC ACID 1 MG PO TABS
1.0000 mg | ORAL_TABLET | Freq: Every day | ORAL | Status: DC
  Administered 2014-12-18 – 2014-12-20 (×3): 1 mg via ORAL
  Filled 2014-12-18 (×3): qty 1

## 2014-12-18 MED ORDER — SPIRONOLACTONE 25 MG PO TABS
50.0000 mg | ORAL_TABLET | Freq: Every day | ORAL | Status: DC
  Administered 2014-12-18 – 2014-12-20 (×3): 50 mg via ORAL
  Filled 2014-12-18 (×3): qty 2

## 2014-12-18 MED ORDER — VITAMIN B-1 100 MG PO TABS
100.0000 mg | ORAL_TABLET | Freq: Every day | ORAL | Status: DC
  Administered 2014-12-18 – 2014-12-20 (×3): 100 mg via ORAL
  Filled 2014-12-18 (×3): qty 1

## 2014-12-18 MED ORDER — INSULIN GLARGINE 100 UNIT/ML SOLOSTAR PEN: 20.0000 [IU] | PEN_INJECTOR | Freq: Every day | SUBCUTANEOUS | Status: DC

## 2014-12-18 MED ORDER — INSULIN ASPART 100 UNIT/ML ~~LOC~~ SOLN
0.0000 [IU] | Freq: Three times a day (TID) | SUBCUTANEOUS | Status: DC
  Administered 2014-12-18 – 2014-12-19 (×3): 2 [IU] via SUBCUTANEOUS
  Administered 2014-12-19: 3 [IU] via SUBCUTANEOUS
  Administered 2014-12-20: 1 [IU] via SUBCUTANEOUS

## 2014-12-18 MED ORDER — INSULIN GLARGINE 100 UNIT/ML ~~LOC~~ SOLN
20.0000 [IU] | Freq: Every day | SUBCUTANEOUS | Status: DC
  Administered 2014-12-18 – 2014-12-19 (×2): 20 [IU] via SUBCUTANEOUS
  Filled 2014-12-18 (×3): qty 0.2

## 2014-12-18 MED ORDER — ENSURE ENLIVE PO LIQD
237.0000 mL | Freq: Two times a day (BID) | ORAL | Status: DC
  Administered 2014-12-18 – 2014-12-20 (×4): 237 mL via ORAL

## 2014-12-18 MED ORDER — CLONAZEPAM 0.25 MG PO TBDP
0.2500 mg | ORAL_TABLET | Freq: Every day | ORAL | Status: DC
  Administered 2014-12-18 – 2014-12-19 (×2): 0.25 mg via ORAL
  Filled 2014-12-18 (×2): qty 2

## 2014-12-18 MED ORDER — POTASSIUM CHLORIDE CRYS ER 20 MEQ PO TBCR
40.0000 meq | EXTENDED_RELEASE_TABLET | Freq: Once | ORAL | Status: AC
  Administered 2014-12-18: 40 meq via ORAL
  Filled 2014-12-18: qty 2

## 2014-12-18 NOTE — Consult Note (Signed)
   [facility] CM Inpatient Consult   12/18/2014  [name] 1955/11/01 [id number]   Received referral from inpatient RNCM. Cross checked for [facility] Care Management eligibility. However, patient is not eligible for [facility] services at this time. Sent notification to inpatient RNCM to make aware.  Marthenia Rolling, MSN-Ed, RN,BSN [facility] Liaison [phone]

## 2014-12-18 NOTE — Care Management Note (Signed)
Case Management Note  Patient Details  Name: [name] MRN: [id number] Date of Birth: 08/19/1955  Subjective/Objective:                 Patient from home, lives with girlfriend. Per patient- her daughter will be coming to stay with them prior to patient going home, he will have 24 hour supervision. He states that he has no problem getting or paying for his medications. He was on Xifaxin prior to admission, however compliance was questionable as ammonia was ~200.    Action/Plan:  Anticipate patient needing HH PT, SW, and RN, or SNF placement. PT evaluation not complete at this time, will await recommendation.  Patient states that he has received [facility] services from [facility].  Expected Discharge Date:                  Expected Discharge Plan:  Skilled Nursing Facility  In-House Referral:  Clinical Social Work  Discharge planning Services  CM Consult  Post Acute Care Choice:    Choice offered to:     DME Arranged:    DME Agency:     HH Arranged:    [place] Agency:     Status of Service:  In process, will continue to follow  Medicare Important Message Given:  Yes-second notification given Date Medicare IM Given:    Medicare IM give by:    Date Additional Medicare IM Given:    Additional Medicare Important Message give by:     If discussed at [place] of Stay Meetings, dates discussed:    Additional Comments:  Carles Collet, RN 12/18/2014, 11:52 AM

## 2014-12-18 NOTE — Progress Notes (Addendum)
Nutrition Follow-Up  DOCUMENTATION CODES:   Severe malnutrition in context of chronic illness  INTERVENTION:   -RD to follow for diet advancement -Boost Breeze po TID, each supplement provides 250 kcal and 9 grams of protein  NUTRITION DIAGNOSIS:   Malnutrition related to chronic illness as evidenced by moderate depletion of body fat, severe depletion of body fat, moderate depletions of muscle mass, severe depletion of muscle mass.  GOAL:   Patient will meet greater than or equal to 90% of their needs   MONITOR:   PO intake, Supplement acceptance, Diet advancement, Labs, Weight trends, Skin, I & O's  REASON FOR ASSESSMENT:   Consult Enteral/tube feeding initiation and management  ASSESSMENT:   On 7/24 patient was found unresponsive at home with non-bloody vomiting. He was admitted to the ICU from the ED after being intubated.  Pt extubated on 12/16/14. Noted TF was not initiated d/t UGIB. OGT was d/c on 12/17/14.  Spoke with pt at bedside. He reports that he is tolerating clear liquid diet well. Noted PO: 80-100%. Pt consumed 100% of Boost Breeze supplement; he reports he enjoys this supplement and would like to continue receiving them.   Nutrition-Focused physical exam completed. Findings are moderate to severe fat depletion, moderate to severe muscle depletion, and moderate edema. RD also noted significant abdominal distention on exam.  Noted 17# wt loss since last visit, likely due to fluid loss. Per MD notes, pt with fluid overload. He is currently receiving IV lasix.   Discussed with pt importance of good nutritional intake to promote healing.   Labs reviewed: K: 3.3.   Diet Order:  Diet clear liquid Room service appropriate?: Yes; Fluid consistency:: Thin  Skin:  Reviewed, no issues (open rt hand incision)  Last BM:  12/17/14  Height:   Ht Readings from Last 1 Encounters:  12/17/14 5\' 11"  (1.803 m)    Weight:   Wt Readings from Last 1 Encounters:   12/18/14 213 lb 4.8 oz (96.752 kg)    Ideal Body Weight:  78.2 kg  BMI:  Body mass index is 29.76 kg/(m^2).  Estimated Nutritional Needs:   Kcal:  2200-2400  Protein:  115-125 grams  Fluid:  2.2-2.4 L  EDUCATION NEEDS:   Education needs addressed  Princetta Uplinger A. Jimmye Norman, RD, LDN, CDE Pager: [phone] After hours Pager: [phone]

## 2014-12-18 NOTE — Progress Notes (Signed)
Patient Demographics:    [name], is a 59 y.o. male, DOB - May 13, 1956, JJO:[id number]  Admit date - 12/14/2014   Admitting Physician Collene Gobble, MD  Outpatient Primary MD for the patient is Barbette Merino, MD  LOS - 4   Chief Complaint  Patient presents with  . Altered Mental Status      Summary  59 yo male with ETOH cirrhosis, portal HTN with varices and ascites found unresponsive at home covered in vomit. Found to have ammonia level of 252. Intubated in ER on . PCCM admitted  Recurrent hospitalizations for hepatic encephalopathy, diastolic HF, type 2 diabetes, hx of cocaine and alcohol abuse. He was recently discharged from the medicine service (on 12/05/14) from GI bleed due to esophageal varices. He had an EGD on 7/10 that showed portal gastropathy and varices with no hemorrhage, and bleeding spontaneously resolved.   On 7/24 patient was found unresponsive at home with non-bloody vomiting. He was admitted to the ICU from the ED after being intubated. Head CT was negative, paracentesis was negative for SBP, so due to his high ammonia levels, and unreponsiveness on vent, patient is being treated for hepatic encephalopathy and liver failure symptoms with octreotide, protonix, lactulose, and xifaxin.     Subjective:    [name] today has, No headache, No chest pain, No abdominal pain - No Nausea, No new weakness tingling or numbness, No Cough - SOB.     Assessment  & Plan :    Active Problems:   Diabetes mellitus   Thrombocytopenia   Cirrhosis   Hepatic encephalopathy   Acute respiratory failure   Acute renal failure   Pressure ulcer   1. Hepatic encephalopathy in a patient with alcoholic cirrhosis noncompliant with his medications, ammonia of 252 upon admission. Was found unresponsive  on 12/14/2014 requiring intubation and mechanical ventilation for airway protection. Was extubated 12/16/2014 and transferred to my service on 12/18/2014.  Currently on lactulose and Xifaxan, mentation much improved, protecting airway, oxygen nebulizer treatments as needed. Counseled on compliance with medications.   2. Alcoholic cirrhosis with history of gastric and esophageal varices, history of alcohol and cocaine use - seen by GI this admission, continue lactulose and Xifaxan as above, no evidence of SBP this admission paracentesis per GI was unremarkable, no evidence of GI bleed this admission. Continue supportive care. Counseled to quit alcohol and cocaine. Has been using both till recently. We'll continue diuresis, will request IR to do another therapeutic paracentesis as his ascites seems to have become quite large.   3. Thrombocytopenia due to #2 above. Stable no evidence of ongoing bleed.    4. ARF upon admission. Due to fluid overload and decompensation, improving with gentle diuresis. We'll continue to monitor potassium closely while on Aldactone.   5. Hypokalemia. Replaced.    6. Diastolic CHF with EF 16%. Compensated, third space due to low albumen, continue diuresis.    7. DM type II. On sliding scale continue.  Lab Results  Component Value Date   HGBA1C 5.9* 08/14/2014    CBG (last 3)   Recent Labs  12/18/14 0008 12/18/14 0421 12/18/14 0819  GLUCAP 123* 85 96       Code Status : Full  Family Communication  :None  Disposition Plan  : TBD  Consults  : PCCM, GI, IR  Procedures  :   7/24 CT head and C spine - chronic atrophy and microvascular ischemia, no acute process   7/24 paracentesis - negative for SBP  DVT Prophylaxis  :   SCDs   Lab Results  Component Value Date   PLT 49* 12/18/2014    Inpatient Medications  Scheduled Meds: . antiseptic oral rinse  7 mL Mouth Rinse QID  . chlorhexidine  15 mL Mouth Rinse BID  . feeding supplement   1 Container Oral TID BM  . furosemide  40 mg Intravenous Q6H  . insulin aspart  2-6 Units Subcutaneous 6 times per day  . lactulose  30 g Per Tube TID  . pantoprazole  40 mg Oral BID  . rifaximin  550 mg Per Tube BID   Continuous Infusions: . sodium chloride 10 mL/hr at 12/17/14 0700   PRN Meds:.albuterol  Antibiotics  :     Anti-infectives    Start     Dose/Rate Route Frequency Ordered Stop   12/15/14 0100  cefTAZidime (FORTAZ) 2 g in dextrose 5 % 50 mL IVPB  Status:  Discontinued     2 g 100 mL/hr over 30 Minutes Intravenous Every 12 hours 12/14/14 1445 12/16/14 0751   12/14/14 1500  rifaximin (XIFAXAN) tablet 550 mg     550 mg Per Tube 2 times daily 12/14/14 1353     12/14/14 1230  cefTAZidime (FORTAZ) 1 g in dextrose 5 % 50 mL IVPB  Status:  Discontinued     1 g 100 mL/hr over 30 Minutes Intravenous Every 12 hours 12/14/14 1202 12/14/14 1445        Objective:   Filed Vitals:   12/17/14 1604 12/17/14 2130 12/18/14 0459 12/18/14 0505  BP: 147/65 133/72  124/70  Pulse: 71 83  76  Temp: 98.3 F (36.8 C) 98.2 F (36.8 C)  98.6 F (37 C)  TempSrc: Oral Oral  Oral  Resp: 20 20  20   Height: 5\' 11"  (1.803 m)     Weight: 96.752 kg (213 lb 4.8 oz)  96.752 kg (213 lb 4.8 oz)   SpO2: 100% 100%  100%    Wt Readings from Last 3 Encounters:  12/18/14 96.752 kg (213 lb 4.8 oz)  11/30/14 99.837 kg (220 lb 1.6 oz)  11/10/14 102.2 kg (225 lb 5 oz)     Intake/Output Summary (Last 24 hours) at 12/18/14 1140 Last data filed at 12/18/14 0504  Gross per 24 hour  Intake   1065 ml  Output   2051 ml  Net   -986 ml     Physical Exam  Awake Alert, Oriented X 2, No new F.N deficits, Normal affect Andrews.AT,PERRAL Supple Neck,No JVD, No cervical lymphadenopathy appriciated.  Symmetrical Chest wall movement, Good air movement bilaterally, CTAB RRR,No Gallops,Rubs or new Murmurs, No Parasternal Heave +ve B.Sounds, ++ Ascites, No tenderness, No organomegaly appriciated, No rebound -  guarding or rigidity. No Cyanosis, Clubbing or edema, No new Rash , multiple bruises      Data Review:   Micro Results Recent Results (from the past 240 hour(s))  Urine culture     Status: None   Collection Time: 12/14/14  9:15 AM  Result Value Ref Range Status   Specimen Description URINE, CATHETERIZED  Final   Special Requests NONE  Final   Culture NO GROWTH 1 DAY  Final   Report Status 12/15/2014 FINAL  Final  MRSA PCR  Screening     Status: None   Collection Time: 12/14/14  1:57 PM  Result Value Ref Range Status   MRSA by PCR NEGATIVE NEGATIVE Final    Comment:        The GeneXpert MRSA Assay (FDA approved for NASAL specimens only), is one component of a comprehensive MRSA colonization surveillance program. It is not intended to diagnose MRSA infection nor to guide or monitor treatment for MRSA infections.   Urine culture     Status: None   Collection Time: 12/14/14  2:41 PM  Result Value Ref Range Status   Specimen Description URINE, CATHETERIZED  Final   Special Requests NONE  Final   Culture NO GROWTH 1 DAY  Final   Report Status 12/15/2014 FINAL  Final  Culture, body fluid-bottle     Status: None (Preliminary result)   Collection Time: 12/14/14  2:58 PM  Result Value Ref Range Status   Specimen Description PERITONEAL  Final   Special Requests NONE  Final   Gram Stain   Final    GRAM POSITIVE COCCI IN CLUSTERS AEROBIC BOTTLE ONLY CRITICAL RESULT CALLED TO, READ BACK BY AND VERIFIED WITH: S BOWIE,RN AT 7106 12/17/14 BY L BENFIELD    Culture PENDING  Incomplete   Report Status PENDING  Incomplete  Gram stain     Status: None   Collection Time: 12/14/14  2:58 PM  Result Value Ref Range Status   Specimen Description PERITONEAL  Final   Special Requests NONE  Final   Gram Stain MODERATE POLYMORPHONUCLEAR NO ORGANISMS SEEN   Final   Report Status 12/14/2014 FINAL  Final  Culture, blood (routine x 2)     Status: None (Preliminary result)   Collection Time:  12/14/14  4:10 PM  Result Value Ref Range Status   Specimen Description BLOOD RIGHT ARM  Final   Special Requests BOTTLES DRAWN AEROBIC AND ANAEROBIC 10CC  Final   Culture NO GROWTH 3 DAYS  Final   Report Status PENDING  Incomplete  Culture, blood (routine x 2)     Status: None (Preliminary result)   Collection Time: 12/14/14  4:20 PM  Result Value Ref Range Status   Specimen Description BLOOD RIGHT ARM  Final   Special Requests BOTTLES DRAWN AEROBIC AND ANAEROBIC 10CC  Final   Culture NO GROWTH 3 DAYS  Final   Report Status PENDING  Incomplete    Radiology Reports Dg Chest 2 View  12/09/2014   CLINICAL DATA:  Shortness of breath.  Abdominal distention.  EXAM: CHEST  2 VIEW  COMPARISON:  11/29/2014  FINDINGS: The heart size and mediastinal contours are within normal limits. Both lungs are clear. The visualized skeletal structures are unremarkable.  IMPRESSION: No active cardiopulmonary disease.   Electronically Signed   By: Lorriane Shire M.D.   On: 12/09/2014 11:33   Dg Chest 2 View  11/29/2014   CLINICAL DATA:  Lower extremity edema, abdominal distention x1 week  EXAM: CHEST - 2 VIEW  COMPARISON:  11/10/2014  FINDINGS: Improved aeration. Lungs are clear. Heart size normal. No pneumothorax. No effusion. Visualized skeletal structures are unremarkable.  IMPRESSION: No acute cardiopulmonary disease.   Electronically Signed   By: Lucrezia Europe M.D.   On: 11/29/2014 12:50   Ct Head Wo Contrast  12/14/2014   CLINICAL DATA:  Patient found unresponsive today. Initial encounter.  EXAM: CT HEAD WITHOUT CONTRAST  CT CERVICAL SPINE WITHOUT CONTRAST  TECHNIQUE: Multidetector CT imaging of the head and cervical spine  was performed following the standard protocol without intravenous contrast. Multiplanar CT image reconstructions of the cervical spine were also generated.  COMPARISON:  Head CT scan 11/10/2014. Head and cervical spine CT scans 08/29/2014.  FINDINGS: CT HEAD FINDINGS  There is advanced for age  appearing cortical atrophy. There is also some chronic microvascular ischemic change. No evidence of acute abnormality including hemorrhage, infarct, mass lesion, mass effect, midline shift or abnormal extra-axial fluid collection is identified. The calvarium is intact. Imaged paranasal sinuses and mastoid air cells are clear.  CT CERVICAL SPINE FINDINGS  There is no fracture or malalignment of the cervical spine. Endotracheal tube and OG tube are in place. Intervertebral disc space height is maintained. Lung apices are clear.  IMPRESSION: No acute abnormality head or cervical spine.  Atrophy and chronic microvascular ischemic change.   Electronically Signed   By: Inge Rise M.D.   On: 12/14/2014 10:02   Ct Cervical Spine Wo Contrast  12/14/2014   CLINICAL DATA:  Patient found unresponsive today. Initial encounter.  EXAM: CT HEAD WITHOUT CONTRAST  CT CERVICAL SPINE WITHOUT CONTRAST  TECHNIQUE: Multidetector CT imaging of the head and cervical spine was performed following the standard protocol without intravenous contrast. Multiplanar CT image reconstructions of the cervical spine were also generated.  COMPARISON:  Head CT scan 11/10/2014. Head and cervical spine CT scans 08/29/2014.  FINDINGS: CT HEAD FINDINGS  There is advanced for age appearing cortical atrophy. There is also some chronic microvascular ischemic change. No evidence of acute abnormality including hemorrhage, infarct, mass lesion, mass effect, midline shift or abnormal extra-axial fluid collection is identified. The calvarium is intact. Imaged paranasal sinuses and mastoid air cells are clear.  CT CERVICAL SPINE FINDINGS  There is no fracture or malalignment of the cervical spine. Endotracheal tube and OG tube are in place. Intervertebral disc space height is maintained. Lung apices are clear.  IMPRESSION: No acute abnormality head or cervical spine.  Atrophy and chronic microvascular ischemic change.   Electronically Signed   By: Inge Rise M.D.   On: 12/14/2014 10:02   US Abdomen Complete  12/10/2014   CLINICAL DATA:  Cirrhosis.  EXAM: ULTRASOUND ABDOMEN COMPLETE  COMPARISON:  CT 09/17/2014.  FINDINGS: Gallbladder: 4 mm gallbladder polyp. Prominent gallbladder wall thickening at 9.6 mm. This most likely secondary to hyperproteinemia. Similar findings noted on prior CT. Acalculous cholecystitis cannot be excluded . No gallstones. Negative Murphy sign.  Common bile duct: Diameter: 3.9 mm  Liver: Small nodular liver consistent with cirrhosis again noted. Focal ill-defined area of hypo echogenicity noted in the medial aspect of the left lobe of the liver. A focal hepatic mass cannot be completely excluded. Portal vein is patent.  IVC: No abnormality visualized.  Pancreas: Not visualized.  Spleen: Splenomegaly to 15.2 cm.  Right Kidney: Length: 12.0 cm. Echogenicity within normal limits. No mass or hydronephrosis visualized.  Left Kidney: Length: 11.9 cm. Echogenicity within normal limits. No mass or hydronephrosis visualized.  Abdominal aorta: No aneurysm visualized.  Other findings: Moderate ascites.  IMPRESSION: 1. Small nodular liver again noted consistent with cirrhosis. On today's exam there is a focal area of ill-defined hypo echogenicity in the medial aspect of the left lobe liver. A focal hepatic mass cannot be completely excluded. Gadolinium-enhanced MRI the liver can be obtained for further evaluation as clinically indicated. Associated splenomegaly noted. 2. Moderate ascites. 3. Thickened gallbladder wall again noted. This is most likely from hypoproteinemia. No gallstones noted. Tiny 4 mm gallbladder polyp noted.  Electronically Signed   By: Marcello Moores  Register   On: 12/10/2014 11:00   US Paracentesis  12/10/2014   INDICATION: ascites  EXAM: ULTRASOUND-GUIDED PARACENTESIS  COMPARISON:  Previous para  MEDICATIONS: 10 cc 1% lidocaine  COMPLICATIONS: None immediate  TECHNIQUE: Informed written consent was obtained from the patient  after a discussion of the risks, benefits and alternatives to treatment. A timeout was performed prior to the initiation of the procedure.  Initial ultrasound scanning demonstrates a large amount of ascites within the right lower abdominal quadrant. The right lower abdomen was prepped and draped in the usual sterile fashion. 1% lidocaine with epinephrine was used for local anesthesia. Under direct ultrasound guidance, a 19 gauge, 7-cm, Yueh catheter was introduced. An ultrasound image was saved for documentation purposed. The paracentesis was performed. The catheter was removed and a dressing was applied. The patient tolerated the procedure well without immediate post procedural complication.  FINDINGS: A total of approximately 5 liters of pale yellow fluid was removed.  IMPRESSION: Successful ultrasound-guided paracentesis yielding 5 liters of peritoneal fluid.  Read by:  Lavonia Drafts [facility]   Electronically Signed   By: Marybelle Killings M.D.   On: 12/10/2014 16:30   US Paracentesis  11/29/2014   CLINICAL DATA:  Abdominal distention, ascites. Request diagnostic and therapeutic paracentesis of up to 5 L max  EXAM: ULTRASOUND GUIDED PARACENTESIS  COMPARISON:  Previous paracentesis  PROCEDURE: An ultrasound guided paracentesis was thoroughly discussed with the patient and questions answered. The benefits, risks, alternatives and complications were also discussed. The patient understands and wishes to proceed with the procedure. Written consent was obtained.  Ultrasound was performed to localize and mark an adequate pocket of fluid in the left lower quadrant of the abdomen. The area was then prepped and draped in the normal sterile fashion. 1% Lidocaine was used for local anesthesia. Under ultrasound guidance a Safe-T-Centesis catheter was introduced. Paracentesis was performed. The catheter was removed and a dressing applied.  COMPLICATIONS: None immediate  FINDINGS: A total of approximately 5 L of . Clear yellow. fluid  was removed. A fluid sample was sent for laboratory analysis.  IMPRESSION: Successful ultrasound guided paracentesis yielding 5 L of ascites.  Read by: Ascencion Dike PA-C   Electronically Signed   By: Marybelle Killings M.D.   On: 11/29/2014 14:07   Dg Chest Port 1 View  12/16/2014   CLINICAL DATA:  Acute respiratory failure  EXAM: PORTABLE CHEST - 1 VIEW  COMPARISON:  Portable chest x-ray of December 14, 2014  FINDINGS: The lungs are better inflated today. The interstitial markings remain mildly increased but have improved. There is no pleural effusion or pneumothorax. The cardiac silhouette is enlarged. The central pulmonary vascularity is prominent. The endotracheal tube tip lies 4.2 cm above the carina. The esophagogastric tube tip projects below the inferior margin of the image. The bony thorax exhibits no acute abnormality.  IMPRESSION: Interval improvement in the aeration of both lungs with decreased interstitial edema. Mild interstitial edema persists likely secondary to CHF. The support tubes are in reasonable position today.   Electronically Signed   By: David  Martinique M.D.   On: 12/16/2014 08:13   Dg Chest Portable 1 View  12/14/2014   CLINICAL DATA:  Patient unresponsive.  Status post intubation.  EXAM: PORTABLE CHEST - 1 VIEW  COMPARISON:  PA and lateral chest 12/09/2014.  FINDINGS: Endotracheal tube is in place with the tip approximately 0.5 cm above the carina. Recommend withdrawal of 1.5-2 cm. NG  tube courses into the stomach and below the inferior margin of film. Lung volumes are low with basilar atelectasis. Heart size is normal.  IMPRESSION: ETT tip projects 0.5 cm above the carina. Recommend withdrawal of 1.5-2 cm.  Basilar atelectasis in a low volume chest.  Critical Value/emergent results were called by telephone at the time of interpretation on 12/14/2014 at 9:31 am to Dr. Ezequiel Essex , who verbally acknowledged these results.   Electronically Signed   By: Inge Rise M.D.   On: 12/14/2014  09:32     CBC  Recent Labs Lab 12/14/14 0909  12/16/14 0521  12/17/14 0815 12/17/14 1451 12/17/14 1936 12/18/14 0133 12/18/14 0738  WBC 5.8  < > 5.2  < > 3.8* 4.1 4.1 4.1 4.3  HGB 7.3*  < > 7.0*  < > 7.8* 9.3* 9.2* 9.3* 9.0*  HCT 21.5*  < > 21.1*  < > 23.6* 27.9* 27.1* 27.6* 26.1*  PLT 75*  < > 55*  < > 45* 42* 43* 51* 49*  MCV 93.5  < > 89.0  < > 89.7 89.4 88.9 88.7 88.8  MCH 31.7  < > 29.5  < > 29.7 29.8 30.2 29.9 30.6  MCHC 34.0  < > 33.2  < > 33.1 33.3 33.9 33.7 34.5  RDW 16.5*  < > 20.8*  < > 19.8* 19.0* 19.1* 19.1* 19.0*  LYMPHSABS 0.5*  --  0.8  --   --   --   --   --   --   MONOABS 0.4  --  0.6  --   --   --   --   --   --   EOSABS 0.0  --  0.4  --   --   --   --   --   --   BASOSABS 0.0  --  0.0  --   --   --   --   --   --   < > = values in this interval not displayed.  Chemistries   Recent Labs Lab 12/14/14 0909  12/15/14 0510 12/15/14 2342 12/16/14 0521 12/17/14 0235 12/18/14 0133  NA 133*  < > 135 136 133* 137 135  K 5.2*  < > 5.3* 4.6 4.1 3.8 3.3*  CL 103  < > 107 106 104 107 104  CO2 21*  < > 21* 20* 22 22 24   GLUCOSE 128*  < > 135* 147* 125* 116* 111*  BUN 41*  < > 46* 49* 49* 49* 49*  CREATININE 2.16*  < > 2.41* 2.29* 2.19* 1.72* 1.48*  CALCIUM 7.9*  < > 7.9* 8.2* 7.9* 8.1* 8.0*  MG  --   --  2.4  --   --   --   --   AST 84*  --  80*  --  68* 64* 64*  ALT 39  --  40  --  34 33 32  ALKPHOS 112  --  103  --  86 88 94  BILITOT 2.2*  --  4.7*  --  3.8* 5.7* 4.2*  < > = values in this interval not displayed. ------------------------------------------------------------------------------------------------------------------ estimated creatinine clearance is 64.6 mL/min (by C-G formula based on Cr of 1.48). ------------------------------------------------------------------------------------------------------------------ No results for input(s): HGBA1C in the last 72  hours. ------------------------------------------------------------------------------------------------------------------ No results for input(s): CHOL, HDL, LDLCALC, TRIG, CHOLHDL, LDLDIRECT in the last 72 hours. ------------------------------------------------------------------------------------------------------------------ No results for input(s): TSH, T4TOTAL, T3FREE, THYROIDAB in the last 72 hours.  Invalid input(s): FREET3 ------------------------------------------------------------------------------------------------------------------ No  results for input(s): VITAMINB12, FOLATE, FERRITIN, TIBC, IRON, RETICCTPCT in the last 72 hours.  Coagulation profile  Recent Labs Lab 12/14/14 0909 12/15/14 0510 12/16/14 1610 12/17/14 1451 12/18/14 0133  INR 1.67* 1.66* 1.59* 1.53* 1.55*    No results for input(s): DDIMER in the last 72 hours.  Cardiac Enzymes  Recent Labs Lab 12/14/14 1600 12/14/14 2043 12/15/14 0130  TROPONINI <0.03 <0.03 <0.03   ------------------------------------------------------------------------------------------------------------------ Invalid input(s): POCBNP   Time Spent in minutes  35   Shannia Jacuinde K M.D on 12/18/2014 at 11:40 AM  Between 7am to 7pm - Pager - [phone]  After 7pm go to [email or web address] - password [facility]  [facility] -  Office  [phone]

## 2014-12-19 ENCOUNTER — Inpatient Hospital Stay (HOSPITAL_COMMUNITY): Payer: Medicare PPO

## 2014-12-19 DIAGNOSIS — E43 Unspecified severe protein-calorie malnutrition: Secondary | ICD-10-CM

## 2014-12-19 DIAGNOSIS — K7030 Alcoholic cirrhosis of liver without ascites: Secondary | ICD-10-CM

## 2014-12-19 DIAGNOSIS — J9601 Acute respiratory failure with hypoxia: Secondary | ICD-10-CM

## 2014-12-19 LAB — GLUCOSE, CAPILLARY
GLUCOSE-CAPILLARY: 183 mg/dL — AB (ref 65–99)
Glucose-Capillary: 107 mg/dL — ABNORMAL HIGH (ref 65–99)
Glucose-Capillary: 119 mg/dL — ABNORMAL HIGH (ref 65–99)
Glucose-Capillary: 187 mg/dL — ABNORMAL HIGH (ref 65–99)
Glucose-Capillary: 204 mg/dL — ABNORMAL HIGH (ref 65–99)
Glucose-Capillary: 86 mg/dL (ref 65–99)

## 2014-12-19 LAB — CULTURE, BLOOD (ROUTINE X 2)
CULTURE: NO GROWTH
Culture: NO GROWTH

## 2014-12-19 LAB — POTASSIUM: POTASSIUM: 3.3 mmol/L — AB (ref 3.5–5.1)

## 2014-12-19 MED ORDER — LIDOCAINE HCL (PF) 1 % IJ SOLN
INTRAMUSCULAR | Status: AC
  Filled 2014-12-19: qty 10

## 2014-12-19 MED ORDER — POTASSIUM CHLORIDE CRYS ER 20 MEQ PO TBCR
40.0000 meq | EXTENDED_RELEASE_TABLET | ORAL | Status: AC
  Administered 2014-12-19 (×2): 40 meq via ORAL
  Filled 2014-12-19 (×2): qty 2

## 2014-12-19 NOTE — Evaluation (Signed)
Physical Therapy Evaluation Patient Details Name: [name] MRN: [id number] DOB: 1955/12/08 Today's Date: 12/19/2014   History of Present Illness  58 y.o. male admitted to [facility] on 12/14/14 with AMS found unresponsive at home covered in vomit.  He was intubated in the ED.  Pt was extubated 12/16/14.  He was dx and is being treated for hepatic encphalopathy, liver failure, and underwent a paracentesis 12/19/14 removing 5 L of fluid.  Pt with significant PMHx of neuropathy, DM, bipolar, HTN, stroke, cirrhosis, alcohol and cocaine abuse, COPD, detached retina ("I am essentially blind"), and leg and arm fx surgery (no indication of side in chart 10 years ago).    Clinical Impression  Pt is unsteady and generally unsafe both physically and cognitively to go home at discharge.  He has questionable 24/7 help and would benefit from SNF for rehab before returning home.   PT to follow acutely for deficits listed below.       Follow Up Recommendations SNF    Equipment Recommendations  None recommended by PT    Recommendations for Other Services   NA    Precautions / Restrictions Precautions Precautions: Fall      Mobility  Bed Mobility Overal bed mobility: Modified Independent;Needs Assistance Bed Mobility: Supine to Sit;Sit to Supine     Supine to sit: Modified independent (Device/Increase time);HOB elevated Sit to supine: Min assist   General bed mobility comments: Mod I with use of elevated HOB and railing for leverage as well as extra time to complete the task, unable to get one leg back into bed requiring min assist to return to supine.    Transfers Overall transfer level: Needs assistance Equipment used: Rolling walker (2 wheeled) Transfers: Sit to/from Stand Sit to Stand: Min assist         General transfer comment: Min assist to help steady pt for balance   Ambulation/Gait Ambulation/Gait assistance: Min assist Ambulation Distance (Feet): 20 Feet Assistive device: Rolling  walker (2 wheeled) Gait Pattern/deviations: Step-through pattern;Staggering left;Staggering right Gait velocity: too fast to be safe.    General Gait Details: Max verbal cues for safety with RW and to use RW during gait.  Pt staggering and having difficulty with navigating around obstacles (visual impairment related?).  At times pt lets go of RW and starts to walk holding to furniture.  He has no reguard to his catheter while moving.           Balance Overall balance assessment: Needs assistance Sitting-balance support: Feet supported;No upper extremity supported Sitting balance-Leahy Scale: Good     Standing balance support: Single extremity supported;Bilateral upper extremity supported Standing balance-Leahy Scale: Poor                               Pertinent Vitals/Pain Pain Assessment: Faces Faces Pain Scale: Hurts a little bit Pain Location: generalized Pain Descriptors / Indicators: Grimacing;Guarding Pain Intervention(s): Limited activity within patient's tolerance;Monitored during session;Repositioned    Home Living Family/patient expects to be discharged to:: Private residence Living Arrangements: Spouse/significant other (girlfriend) Available Help at Discharge: Family Type of Home: Apartment Home Access: Level entry     Home Layout: One level Home Equipment: Environmental consultant - 2 wheels;Bedside commode;Tub bench;Hand held shower head      Prior Function     Gait / Transfers Assistance Needed: Uses RW vs cane (cane for house hold mobility and RW for community mobility)  ADL's / Homemaking Assistance Needed:  mod I, uses bench and BSC beside bed)  Comments: h/o falls     Hand Dominance   Dominant Hand: Right    Extremity/Trunk Assessment   Upper Extremity Assessment: Generalized weakness           Lower Extremity Assessment: Generalized weakness      Cervical / Trunk Assessment: Normal  Communication   Communication: No difficulties   Cognition Arousal/Alertness: Awake/alert Behavior During Therapy: Impulsive;Agitated Overall Cognitive Status: No family/caregiver present to determine baseline cognitive functioning Area of Impairment: Attention;Memory;Following commands;Safety/judgement;Awareness;Problem solving   Current Attention Level: Sustained (sustained only to task of using his phone) Memory: Decreased recall of precautions;Decreased short-term memory   Safety/Judgement: Decreased awareness of safety;Decreased awareness of deficits Awareness: Intellectual Problem Solving: Difficulty sequencing;Requires verbal cues;Requires tactile cues General Comments: Pt impulsive, easily distracted by lines and tubes.  Perseverating on getting his pain meds and using his phone to call several people during session.  Despite cues for safety, pt acting and moving impulsively.              Assessment/Plan    PT Assessment Patient needs continued PT services  PT Diagnosis Difficulty walking;Generalized weakness;Abnormality of gait;Altered mental status   PT Problem List Decreased strength;Decreased activity tolerance;Decreased balance;Decreased mobility;Decreased cognition;Decreased knowledge of use of DME;Decreased safety awareness;Decreased knowledge of precautions  PT Treatment Interventions DME instruction;Gait training;Functional mobility training;Stair training;Therapeutic activities;Therapeutic exercise;Balance training;Neuromuscular re-education;Cognitive remediation;Patient/family education   PT Goals (Current goals can be found in the Care Plan section) Acute Rehab PT Goals Patient Stated Goal: to go home and get his pain medication PT Goal Formulation: With patient Time For Goal Achievement: 01/02/15 Potential to Achieve Goals: Good    Frequency Min 2X/week   Barriers to discharge Decreased caregiver support unsure of actual help at discharge         End of Session Equipment Utilized During Treatment: Gait  belt Activity Tolerance: Patient limited by fatigue Patient left: in bed;with call bell/phone within reach;with bed alarm set Nurse Communication: Mobility status         Time: 1522-1600 PT Time Calculation (min) (ACUTE ONLY): 38 min   Charges:   PT Evaluation $Initial PT Evaluation Tier I: 1 Procedure PT Treatments $Therapeutic Activity: 8-22 mins        Deklen Popelka B. [place], [place], DPT [phone]   12/19/2014, 5:39 PM

## 2014-12-19 NOTE — Clinical Social Work Placement (Signed)
   CLINICAL SOCIAL WORK PLACEMENT  NOTE  Date:  12/19/2014  Patient Details  Name: [name] MRN: [id number] Date of Birth: [birth year]  Clinical Social Work is seeking post-discharge placement for this patient at the [place] level of care (*CSW will initial, date and re-position this form in  chart as items are completed):  Yes   Patient/family provided with [facility]'s list of facilities offering this level of care within the geographic area requested by the patient (or if unable, by the patient's family).  Yes   Patient/family informed of their freedom to choose among providers that offer the needed level of care, that participate in Medicare, Medicaid or managed care program needed by the patient, have an available bed and are willing to accept the patient.  Yes   Patient/family informed of 's ownership interest in [facility] and [facility], as well as of the fact that they are under no obligation to receive care at these facilities.  PASRR submitted to EDS on       PASRR number received on       Existing PASRR number confirmed on 12/19/14     FL2 transmitted to all facilities in geographic area requested by pt/family on 12/19/14     FL2 transmitted to all facilities within larger geographic area on       Patient informed that his/her managed care company has contracts with or will negotiate with certain facilities, including the following:            Patient/family informed of bed offers received.  Patient chooses bed at       Physician recommends and patient chooses bed at      Patient to be transferred to   on  .  Patient to be transferred to facility by       Patient family notified on   of transfer.  Name of family member notified:        PHYSICIAN Please sign FL2     Additional Comment:    _______________________________________________ Cranford Mon, LCSW 12/19/2014, 3:31 PM

## 2014-12-19 NOTE — Progress Notes (Signed)
[facility] Progress Note  Subjective: Patient relatively alert oriented to time person and place, no complaints, wants to eat  Objective: Vital signs in last 24 hours: Temp:  [97.8 F (36.6 C)-98.4 F (36.9 C)] 98.4 F (36.9 C) (07/29 0434) Pulse Rate:  [66-83] 66 (07/29 0434) Resp:  [18-20] 18 (07/29 0434) BP: (111-117)/(61-65) 115/65 mmHg (07/29 0434) SpO2:  [99 %-100 %] 100 % (07/29 0434) Weight:  [97.4 kg (214 lb 11.7 oz)] 97.4 kg (214 lb 11.7 oz) (07/29 0434) Weight change: 0.648 kg (1 lb 6.9 oz)   PE: No scleral icterus abdomen distended but soft  Lab Results: Results for orders placed or performed during the hospital encounter of 12/14/14 (from the past 24 hour(s))  Glucose, capillary     Status: None   Collection Time: 12/18/14  8:19 AM  Result Value Ref Range   Glucose-Capillary 96 65 - 99 mg/dL  Glucose, capillary     Status: Abnormal   Collection Time: 12/18/14 11:53 AM  Result Value Ref Range   Glucose-Capillary 194 (H) 65 - 99 mg/dL  Glucose, capillary     Status: Abnormal   Collection Time: 12/18/14  5:06 PM  Result Value Ref Range   Glucose-Capillary 193 (H) 65 - 99 mg/dL  Glucose, capillary     Status: Abnormal   Collection Time: 12/18/14  8:42 PM  Result Value Ref Range   Glucose-Capillary 121 (H) 65 - 99 mg/dL  Glucose, capillary     Status: Abnormal   Collection Time: 12/19/14 12:20 AM  Result Value Ref Range   Glucose-Capillary 119 (H) 65 - 99 mg/dL  Glucose, capillary     Status: Abnormal   Collection Time: 12/19/14  4:29 AM  Result Value Ref Range   Glucose-Capillary 107 (H) 65 - 99 mg/dL  Potassium     Status: Abnormal   Collection Time: 12/19/14  6:30 AM  Result Value Ref Range   Potassium 3.3 (L) 3.5 - 5.1 mmol/L    Studies/Results: No results found.    Assessment: 1. Hepatic encephalopathy, improved 2. Low-grade GI bleeding probably from portal gastropathy, currently hemoglobin  stable 3. Hypokalemia  Plan: 1. Continue rifaximin and/or lactulose 2. Will advance diet 3. Switch to by mouth Lasix and continue Aldactone    Taeden Geller C 12/19/2014, 8:08 AM  Pager [phone] If no answer or after 5 PM call [phone]

## 2014-12-19 NOTE — Progress Notes (Signed)
Patient Demographics:    [name], is a 59 y.o. male, DOB - [birth year], UYQ:[id number]  Admit date - 12/14/2014   Admitting Physician Collene Gobble, MD  Outpatient Primary MD for the patient is Barbette Merino, MD  LOS - 5   Chief Complaint  Patient presents with  . Altered Mental Status      Summary  59 yo male with ETOH cirrhosis, portal HTN with varices and ascites found unresponsive at home covered in vomit. Found to have ammonia level of 252. Intubated in ER on . PCCM admitted  Recurrent hospitalizations for hepatic encephalopathy, diastolic HF, type 2 diabetes, hx of cocaine and alcohol abuse. He was recently discharged from the medicine service (on 12/05/14) from GI bleed due to esophageal varices. He had an EGD on 7/10 that showed portal gastropathy and varices with no hemorrhage, and bleeding spontaneously resolved.   On 7/24 patient was found unresponsive at home with non-bloody vomiting. He was admitted to the ICU from the ED after being intubated. Head CT was negative, paracentesis was negative for SBP, so due to his high ammonia levels, and unreponsiveness on vent, patient is being treated for hepatic encephalopathy and liver failure symptoms with octreotide, protonix, lactulose, and xifaxin.     Subjective:    [name] today has, No headache, No chest pain, No abdominal pain - No Nausea, No new weakness tingling or numbness, No Cough - SOB.     Assessment  & Plan :     1. Hepatic encephalopathy in a patient with alcoholic cirrhosis noncompliant with his medications, ammonia of 252 upon admission. Was found unresponsive on 12/14/2014 requiring intubation and mechanical ventilation for airway protection. Was extubated 12/16/2014 and transferred to my service on 12/18/2014.  Currently on  lactulose and Xifaxan, mentation much improved, protecting airway, oxygen nebulizer treatments as needed. Counseled on compliance with medications.   2. Alcoholic cirrhosis with history of gastric and esophageal varices, history of alcohol and cocaine use - seen by GI this admission, continue lactulose and Xifaxan as above, no evidence of SBP this admission paracentesis per GI was unremarkable, no evidence of GI bleed this admission. Continue supportive care. Counseled to quit alcohol and cocaine. Has been using both till recently. We'll continue diuresis, have requested IR to do another therapeutic paracentesis as his ascites seems to have become quite large.    3. Thrombocytopenia due to #2 above. Stable no evidence of ongoing bleed.    4. ARF upon admission. Due to fluid overload and decompensation, improving with gentle diuresis. We'll continue to monitor potassium closely while on Aldactone.   5. Hypokalemia. Replaced.    6. Diastolic CHF with EF 63%. Compensated, third space due to low albumen, continue diuresis.    7. DM type II. On sliding scale continue.  Lab Results  Component Value Date   HGBA1C 5.9* 08/14/2014    CBG (last 3)   Recent Labs  12/19/14 0020 12/19/14 0429 12/19/14 0754  GLUCAP 119* 107* 86       Code Status : Full  Family Communication  :None  Disposition Plan  : TBD  Consults  : PCCM, GI, IR  Procedures  :   7/24 CT head and C spine - chronic atrophy and microvascular  ischemia, no acute process   7/24 paracentesis - negative for SBP  12/19/2014 repeat ultrasound-guided therapeutic paracentesis requested  DVT Prophylaxis  :   SCDs   Lab Results  Component Value Date   PLT 49* 12/18/2014    Inpatient Medications  Scheduled Meds: . antiseptic oral rinse  7 mL Mouth Rinse QID  . chlorhexidine  15 mL Mouth Rinse BID  . clonazePAM  0.25 mg Oral QHS  . feeding supplement  1 Container Oral TID BM  . feeding supplement (ENSURE  ENLIVE)  237 mL Oral BID BM  . folic acid  1 mg Oral Daily  . furosemide  40 mg Intravenous Q6H  . insulin aspart  0-9 Units Subcutaneous TID WC  . insulin glargine  20 Units Subcutaneous QHS  . lactulose  30 g Per Tube TID  . lidocaine (PF)      . pantoprazole  40 mg Oral BID  . rifaximin  550 mg Per Tube BID  . spironolactone  50 mg Oral Daily  . thiamine  100 mg Oral Daily   Continuous Infusions: . sodium chloride 10 mL/hr at 12/17/14 0700   PRN Meds:.albuterol  Antibiotics  :     Anti-infectives    Start     Dose/Rate Route Frequency Ordered Stop   12/15/14 0100  cefTAZidime (FORTAZ) 2 g in dextrose 5 % 50 mL IVPB  Status:  Discontinued     2 g 100 mL/hr over 30 Minutes Intravenous Every 12 hours 12/14/14 1445 12/16/14 0751   12/14/14 1500  rifaximin (XIFAXAN) tablet 550 mg     550 mg Per Tube 2 times daily 12/14/14 1353     12/14/14 1230  cefTAZidime (FORTAZ) 1 g in dextrose 5 % 50 mL IVPB  Status:  Discontinued     1 g 100 mL/hr over 30 Minutes Intravenous Every 12 hours 12/14/14 1202 12/14/14 1445        Objective:   Filed Vitals:   12/19/14 1010 12/19/14 1015 12/19/14 1020 12/19/14 1025  BP: 105/60 108/58 114/58 115/66  Pulse:      Temp:      TempSrc:      Resp:      Height:      Weight:      SpO2:        Wt Readings from Last 3 Encounters:  12/19/14 97.4 kg (214 lb 11.7 oz)  11/30/14 99.837 kg (220 lb 1.6 oz)  11/10/14 102.2 kg (225 lb 5 oz)     Intake/Output Summary (Last 24 hours) at 12/19/14 1146 Last data filed at 12/19/14 1100  Gross per 24 hour  Intake   1080 ml  Output   3250 ml  Net  -2170 ml     Physical Exam  Awake Alert, Oriented X 2, No new F.N deficits, Normal affect Perry.AT,PERRAL Supple Neck,No JVD, No cervical lymphadenopathy appriciated.  Symmetrical Chest wall movement, Good air movement bilaterally, CTAB RRR,No Gallops,Rubs or new Murmurs, No Parasternal Heave +ve B.Sounds, ++ Ascites, No tenderness, No organomegaly  appriciated, No rebound - guarding or rigidity. No Cyanosis, Clubbing or edema, No new Rash , multiple bruises      Data Review:   Micro Results Recent Results (from the past 240 hour(s))  Urine culture     Status: None   Collection Time: 12/14/14  9:15 AM  Result Value Ref Range Status   Specimen Description URINE, CATHETERIZED  Final   Special Requests NONE  Final   Culture NO GROWTH  1 DAY  Final   Report Status 12/15/2014 FINAL  Final  MRSA PCR Screening     Status: None   Collection Time: 12/14/14  1:57 PM  Result Value Ref Range Status   MRSA by PCR NEGATIVE NEGATIVE Final    Comment:        The GeneXpert MRSA Assay (FDA approved for NASAL specimens only), is one component of a comprehensive MRSA colonization surveillance program. It is not intended to diagnose MRSA infection nor to guide or monitor treatment for MRSA infections.   Urine culture     Status: None   Collection Time: 12/14/14  2:41 PM  Result Value Ref Range Status   Specimen Description URINE, CATHETERIZED  Final   Special Requests NONE  Final   Culture NO GROWTH 1 DAY  Final   Report Status 12/15/2014 FINAL  Final  Culture, body fluid-bottle     Status: None (Preliminary result)   Collection Time: 12/14/14  2:58 PM  Result Value Ref Range Status   Specimen Description PERITONEAL  Final   Special Requests NONE  Final   Gram Stain   Final    GRAM POSITIVE COCCI IN CLUSTERS AEROBIC BOTTLE ONLY CRITICAL RESULT CALLED TO, READ BACK BY AND VERIFIED WITH: S BOWIE,RN AT 7253 12/17/14 BY L BENFIELD    Culture GRAM POSITIVE COCCI  Final   Report Status PENDING  Incomplete  Gram stain     Status: None   Collection Time: 12/14/14  2:58 PM  Result Value Ref Range Status   Specimen Description PERITONEAL  Final   Special Requests NONE  Final   Gram Stain MODERATE POLYMORPHONUCLEAR NO ORGANISMS SEEN   Final   Report Status 12/14/2014 FINAL  Final  Culture, blood (routine x 2)     Status: None  (Preliminary result)   Collection Time: 12/14/14  4:10 PM  Result Value Ref Range Status   Specimen Description BLOOD RIGHT ARM  Final   Special Requests BOTTLES DRAWN AEROBIC AND ANAEROBIC 10CC  Final   Culture NO GROWTH 4 DAYS  Final   Report Status PENDING  Incomplete  Culture, blood (routine x 2)     Status: None (Preliminary result)   Collection Time: 12/14/14  4:20 PM  Result Value Ref Range Status   Specimen Description BLOOD RIGHT ARM  Final   Special Requests BOTTLES DRAWN AEROBIC AND ANAEROBIC 10CC  Final   Culture NO GROWTH 4 DAYS  Final   Report Status PENDING  Incomplete    Radiology Reports Dg Chest 2 View  12/09/2014   CLINICAL DATA:  Shortness of breath.  Abdominal distention.  EXAM: CHEST  2 VIEW  COMPARISON:  11/29/2014  FINDINGS: The heart size and mediastinal contours are within normal limits. Both lungs are clear. The visualized skeletal structures are unremarkable.  IMPRESSION: No active cardiopulmonary disease.   Electronically Signed   By: Lorriane Shire M.D.   On: 12/09/2014 11:33   Dg Chest 2 View  11/29/2014   CLINICAL DATA:  Lower extremity edema, abdominal distention x1 week  EXAM: CHEST - 2 VIEW  COMPARISON:  11/10/2014  FINDINGS: Improved aeration. Lungs are clear. Heart size normal. No pneumothorax. No effusion. Visualized skeletal structures are unremarkable.  IMPRESSION: No acute cardiopulmonary disease.   Electronically Signed   By: Lucrezia Europe M.D.   On: 11/29/2014 12:50   Ct Head Wo Contrast  12/14/2014   CLINICAL DATA:  Patient found unresponsive today. Initial encounter.  EXAM: CT HEAD WITHOUT CONTRAST  CT CERVICAL SPINE WITHOUT CONTRAST  TECHNIQUE: Multidetector CT imaging of the head and cervical spine was performed following the standard protocol without intravenous contrast. Multiplanar CT image reconstructions of the cervical spine were also generated.  COMPARISON:  Head CT scan 11/10/2014. Head and cervical spine CT scans 08/29/2014.  FINDINGS: CT  HEAD FINDINGS  There is advanced for age appearing cortical atrophy. There is also some chronic microvascular ischemic change. No evidence of acute abnormality including hemorrhage, infarct, mass lesion, mass effect, midline shift or abnormal extra-axial fluid collection is identified. The calvarium is intact. Imaged paranasal sinuses and mastoid air cells are clear.  CT CERVICAL SPINE FINDINGS  There is no fracture or malalignment of the cervical spine. Endotracheal tube and OG tube are in place. Intervertebral disc space height is maintained. Lung apices are clear.  IMPRESSION: No acute abnormality head or cervical spine.  Atrophy and chronic microvascular ischemic change.   Electronically Signed   By: Inge Rise M.D.   On: 12/14/2014 10:02   Ct Cervical Spine Wo Contrast  12/14/2014   CLINICAL DATA:  Patient found unresponsive today. Initial encounter.  EXAM: CT HEAD WITHOUT CONTRAST  CT CERVICAL SPINE WITHOUT CONTRAST  TECHNIQUE: Multidetector CT imaging of the head and cervical spine was performed following the standard protocol without intravenous contrast. Multiplanar CT image reconstructions of the cervical spine were also generated.  COMPARISON:  Head CT scan 11/10/2014. Head and cervical spine CT scans 08/29/2014.  FINDINGS: CT HEAD FINDINGS  There is advanced for age appearing cortical atrophy. There is also some chronic microvascular ischemic change. No evidence of acute abnormality including hemorrhage, infarct, mass lesion, mass effect, midline shift or abnormal extra-axial fluid collection is identified. The calvarium is intact. Imaged paranasal sinuses and mastoid air cells are clear.  CT CERVICAL SPINE FINDINGS  There is no fracture or malalignment of the cervical spine. Endotracheal tube and OG tube are in place. Intervertebral disc space height is maintained. Lung apices are clear.  IMPRESSION: No acute abnormality head or cervical spine.  Atrophy and chronic microvascular ischemic  change.   Electronically Signed   By: Inge Rise M.D.   On: 12/14/2014 10:02   US Abdomen Complete  12/10/2014   CLINICAL DATA:  Cirrhosis.  EXAM: ULTRASOUND ABDOMEN COMPLETE  COMPARISON:  CT 09/17/2014.  FINDINGS: Gallbladder: 4 mm gallbladder polyp. Prominent gallbladder wall thickening at 9.6 mm. This most likely secondary to hyperproteinemia. Similar findings noted on prior CT. Acalculous cholecystitis cannot be excluded . No gallstones. Negative Murphy sign.  Common bile duct: Diameter: 3.9 mm  Liver: Small nodular liver consistent with cirrhosis again noted. Focal ill-defined area of hypo echogenicity noted in the medial aspect of the left lobe of the liver. A focal hepatic mass cannot be completely excluded. Portal vein is patent.  IVC: No abnormality visualized.  Pancreas: Not visualized.  Spleen: Splenomegaly to 15.2 cm.  Right Kidney: Length: 12.0 cm. Echogenicity within normal limits. No mass or hydronephrosis visualized.  Left Kidney: Length: 11.9 cm. Echogenicity within normal limits. No mass or hydronephrosis visualized.  Abdominal aorta: No aneurysm visualized.  Other findings: Moderate ascites.  IMPRESSION: 1. Small nodular liver again noted consistent with cirrhosis. On today's exam there is a focal area of ill-defined hypo echogenicity in the medial aspect of the left lobe liver. A focal hepatic mass cannot be completely excluded. Gadolinium-enhanced MRI the liver can be obtained for further evaluation as clinically indicated. Associated splenomegaly noted. 2. Moderate ascites. 3. Thickened gallbladder wall again  noted. This is most likely from hypoproteinemia. No gallstones noted. Tiny 4 mm gallbladder polyp noted.   Electronically Signed   By: Marcello Moores  Register   On: 12/10/2014 11:00   US Paracentesis  12/10/2014   INDICATION: ascites  EXAM: ULTRASOUND-GUIDED PARACENTESIS  COMPARISON:  Previous para  MEDICATIONS: 10 cc 1% lidocaine  COMPLICATIONS: None immediate  TECHNIQUE: Informed  written consent was obtained from the patient after a discussion of the risks, benefits and alternatives to treatment. A timeout was performed prior to the initiation of the procedure.  Initial ultrasound scanning demonstrates a large amount of ascites within the right lower abdominal quadrant. The right lower abdomen was prepped and draped in the usual sterile fashion. 1% lidocaine with epinephrine was used for local anesthesia. Under direct ultrasound guidance, a 19 gauge, 7-cm, Yueh catheter was introduced. An ultrasound image was saved for documentation purposed. The paracentesis was performed. The catheter was removed and a dressing was applied. The patient tolerated the procedure well without immediate post procedural complication.  FINDINGS: A total of approximately 5 liters of pale yellow fluid was removed.  IMPRESSION: Successful ultrasound-guided paracentesis yielding 5 liters of peritoneal fluid.  Read by:  Lavonia Drafts [facility]   Electronically Signed   By: Marybelle Killings M.D.   On: 12/10/2014 16:30   US Paracentesis  11/29/2014   CLINICAL DATA:  Abdominal distention, ascites. Request diagnostic and therapeutic paracentesis of up to 5 L max  EXAM: ULTRASOUND GUIDED PARACENTESIS  COMPARISON:  Previous paracentesis  PROCEDURE: An ultrasound guided paracentesis was thoroughly discussed with the patient and questions answered. The benefits, risks, alternatives and complications were also discussed. The patient understands and wishes to proceed with the procedure. Written consent was obtained.  Ultrasound was performed to localize and mark an adequate pocket of fluid in the left lower quadrant of the abdomen. The area was then prepped and draped in the normal sterile fashion. 1% Lidocaine was used for local anesthesia. Under ultrasound guidance a Safe-T-Centesis catheter was introduced. Paracentesis was performed. The catheter was removed and a dressing applied.  COMPLICATIONS: None immediate  FINDINGS: A total  of approximately 5 L of . Clear yellow. fluid was removed. A fluid sample was sent for laboratory analysis.  IMPRESSION: Successful ultrasound guided paracentesis yielding 5 L of ascites.  Read by: Ascencion Dike PA-C   Electronically Signed   By: Marybelle Killings M.D.   On: 11/29/2014 14:07   Dg Chest Port 1 View  12/16/2014   CLINICAL DATA:  Acute respiratory failure  EXAM: PORTABLE CHEST - 1 VIEW  COMPARISON:  Portable chest x-ray of December 14, 2014  FINDINGS: The lungs are better inflated today. The interstitial markings remain mildly increased but have improved. There is no pleural effusion or pneumothorax. The cardiac silhouette is enlarged. The central pulmonary vascularity is prominent. The endotracheal tube tip lies 4.2 cm above the carina. The esophagogastric tube tip projects below the inferior margin of the image. The bony thorax exhibits no acute abnormality.  IMPRESSION: Interval improvement in the aeration of both lungs with decreased interstitial edema. Mild interstitial edema persists likely secondary to CHF. The support tubes are in reasonable position today.   Electronically Signed   By: David  Martinique M.D.   On: 12/16/2014 08:13   Dg Chest Portable 1 View  12/14/2014   CLINICAL DATA:  Patient unresponsive.  Status post intubation.  EXAM: PORTABLE CHEST - 1 VIEW  COMPARISON:  PA and lateral chest 12/09/2014.  FINDINGS: Endotracheal tube  is in place with the tip approximately 0.5 cm above the carina. Recommend withdrawal of 1.5-2 cm. NG tube courses into the stomach and below the inferior margin of film. Lung volumes are low with basilar atelectasis. Heart size is normal.  IMPRESSION: ETT tip projects 0.5 cm above the carina. Recommend withdrawal of 1.5-2 cm.  Basilar atelectasis in a low volume chest.  Critical Value/emergent results were called by telephone at the time of interpretation on 12/14/2014 at 9:31 am to Dr. Ezequiel Essex , who verbally acknowledged these results.   Electronically Signed    By: Inge Rise M.D.   On: 12/14/2014 09:32     CBC  Recent Labs Lab 12/14/14 0909  12/16/14 0521  12/17/14 0815 12/17/14 1451 12/17/14 1936 12/18/14 0133 12/18/14 0738  WBC 5.8  < > 5.2  < > 3.8* 4.1 4.1 4.1 4.3  HGB 7.3*  < > 7.0*  < > 7.8* 9.3* 9.2* 9.3* 9.0*  HCT 21.5*  < > 21.1*  < > 23.6* 27.9* 27.1* 27.6* 26.1*  PLT 75*  < > 55*  < > 45* 42* 43* 51* 49*  MCV 93.5  < > 89.0  < > 89.7 89.4 88.9 88.7 88.8  MCH 31.7  < > 29.5  < > 29.7 29.8 30.2 29.9 30.6  MCHC 34.0  < > 33.2  < > 33.1 33.3 33.9 33.7 34.5  RDW 16.5*  < > 20.8*  < > 19.8* 19.0* 19.1* 19.1* 19.0*  LYMPHSABS 0.5*  --  0.8  --   --   --   --   --   --   MONOABS 0.4  --  0.6  --   --   --   --   --   --   EOSABS 0.0  --  0.4  --   --   --   --   --   --   BASOSABS 0.0  --  0.0  --   --   --   --   --   --   < > = values in this interval not displayed.  Chemistries   Recent Labs Lab 12/14/14 0909  12/15/14 0510 12/15/14 2342 12/16/14 0521 12/17/14 0235 12/18/14 0133 12/19/14 0630  NA 133*  < > 135 136 133* 137 135  --   K 5.2*  < > 5.3* 4.6 4.1 3.8 3.3* 3.3*  CL 103  < > 107 106 104 107 104  --   CO2 21*  < > 21* 20* 22 22 24   --   GLUCOSE 128*  < > 135* 147* 125* 116* 111*  --   BUN 41*  < > 46* 49* 49* 49* 49*  --   CREATININE 2.16*  < > 2.41* 2.29* 2.19* 1.72* 1.48*  --   CALCIUM 7.9*  < > 7.9* 8.2* 7.9* 8.1* 8.0*  --   MG  --   --  2.4  --   --   --   --   --   AST 84*  --  80*  --  68* 64* 64*  --   ALT 39  --  40  --  34 33 32  --   ALKPHOS 112  --  103  --  86 88 94  --   BILITOT 2.2*  --  4.7*  --  3.8* 5.7* 4.2*  --   < > = values in this interval not displayed. ------------------------------------------------------------------------------------------------------------------ estimated creatinine clearance is 64.7  mL/min (by C-G formula based on Cr of 1.48). ------------------------------------------------------------------------------------------------------------------ No results  for input(s): HGBA1C in the last 72 hours. ------------------------------------------------------------------------------------------------------------------ No results for input(s): CHOL, HDL, LDLCALC, TRIG, CHOLHDL, LDLDIRECT in the last 72 hours. ------------------------------------------------------------------------------------------------------------------ No results for input(s): TSH, T4TOTAL, T3FREE, THYROIDAB in the last 72 hours.  Invalid input(s): FREET3 ------------------------------------------------------------------------------------------------------------------ No results for input(s): VITAMINB12, FOLATE, FERRITIN, TIBC, IRON, RETICCTPCT in the last 72 hours.  Coagulation profile  Recent Labs Lab 12/14/14 0909 12/15/14 0510 12/16/14 1610 12/17/14 1451 12/18/14 0133  INR 1.67* 1.66* 1.59* 1.53* 1.55*    No results for input(s): DDIMER in the last 72 hours.  Cardiac Enzymes  Recent Labs Lab 12/14/14 1600 12/14/14 2043 12/15/14 0130  TROPONINI <0.03 <0.03 <0.03   ------------------------------------------------------------------------------------------------------------------ Invalid input(s): POCBNP   Time Spent in minutes  35   SINGH,PRASHANT K M.D on 12/19/2014 at 11:46 AM  Between 7am to 7pm - Pager - [phone]  After 7pm go to [email or web address] - password [facility]  [facility] -  Office  [phone]

## 2014-12-19 NOTE — Procedures (Signed)
Successful US guided paracentesis from RLQ.  Yielded 5 liters of clear yellow fluid.  No immediate complications.  Pt tolerated well.   Specimen was not sent for labs.  Omeka Holben S Trinidad Petron PA-C 12/19/2014 11:19 AM

## 2014-12-19 NOTE — Progress Notes (Signed)
Utilization Review completed. Zanae Kuehnle RN BSN CM 

## 2014-12-19 NOTE — Evaluation (Signed)
Occupational Therapy Evaluation Patient Details Name: [name] MRN: [id number] DOB: [birth year] Today's Date: 12/19/2014    History of Present Illness 59 y.o. male admitted to [facility] on 12/14/14 with AMS found unresponsive at home covered in vomit.  He was intubated in the ED.  Pt was extubated 12/16/14.  He was dx and is being treated for hepatic encphalopathy, liver failure, and underwent a paracentesis 12/19/14 removing 5 L of fluid.  Pt with significant PMHx of neuropathy, DM, bipolar, HTN, stroke, cirrhosis, alcohol and cocaine abuse, COPD, detached retina ("I am essentially blind"), and leg and arm fx surgery (no indication of side in chart 10 years ago).     Clinical Impression   Pt admitted with above. He demonstrates the below listed deficits and will benefit from continued OT to maximize safety and independence with BADLs.  Pt presents with significant cognitive deficits, impaired balance, and generalized weakness.  He requires min - max A for ADLs as he is unable to maintain attention to tasks and requires extensive amount of time to complete and maximal cues.   He states his daughter is arriving from [facility] to care for him, but she has four young children - unsure if she will be able to provide adequate assist. Recommend SNF.        Follow Up Recommendations  SNF;Supervision/Assistance - 24 hour    Equipment Recommendations  None recommended by OT    Recommendations for Other Services       Precautions / Restrictions Precautions Precautions: Fall      Mobility Bed Mobility Overal bed mobility: Needs Assistance Bed Mobility: Supine to Sit;Sit to Supine     Supine to sit: Supervision Sit to supine: Supervision   General bed mobility comments: Mod I with use of elevated HOB and railing for leverage as well as extra time to complete the task, unable to get one leg back into bed requiring min assist to return to supine.    Transfers Overall transfer level: Needs  assistance Equipment used: Rolling walker (2 wheeled) Transfers: Sit to/from [facility] Sit to Stand: Min assist Stand pivot transfers: Min assist       General transfer comment: max verbal cues for safety and hand placement     Balance Overall balance assessment: Needs assistance Sitting-balance support: Feet supported Sitting balance-Leahy Scale: Good     Standing balance support: Bilateral upper extremity supported Standing balance-Leahy Scale: Poor Standing balance comment: reliant on UE support                             ADL Overall ADL's : Needs assistance/impaired Eating/Feeding: Set up;Sitting;Supervision/ safety   Grooming: Wash/dry hands;Wash/dry face;Oral care;Brushing hair;Moderate assistance;Sitting Grooming Details (indicate cue type and reason): due to attentional deficits  Upper Body Bathing: Maximal assistance;Sitting Upper Body Bathing Details (indicate cue type and reason): due to attentional deficits Lower Body Bathing: Maximal assistance;Sit to/from stand Lower Body Bathing Details (indicate cue type and reason): due to attentional deficits Upper Body Dressing : Moderate assistance;Sitting   Lower Body Dressing: Maximal assistance;Sit to/from stand Lower Body Dressing Details (indicate cue type and reason): due to attentional deficits Toilet Transfer: Minimal assistance;Ambulation;RW   Toileting- Clothing Manipulation and Hygiene: Moderate assistance;Sit to/from stand       Functional mobility during ADLs: Minimal assistance;Rolling walker General ADL Comments: Pt required mod A and max verbal cues and an extensive amount of time to don/doff socks as he self  distracts easily      Vision     Perception     Praxis      Pertinent Vitals/Pain Pain Assessment: Faces Faces Pain Scale: Hurts a little bit Pain Location: generalized  Pain Descriptors / Indicators: Grimacing Pain Intervention(s): Monitored during session      Hand Dominance Right   Extremity/Trunk Assessment Upper Extremity Assessment Upper Extremity Assessment: Generalized weakness;RUE deficits/detail;LUE deficits/detail RUE Deficits / Details: wasting of thenar eminence noted bil. hands  LUE Deficits / Details: wasting of thenar eminence noted bil. hands   Lower Extremity Assessment Lower Extremity Assessment: Generalized weakness   Cervical / Trunk Assessment Cervical / Trunk Assessment: Normal   Communication Communication Communication: No difficulties   Cognition Arousal/Alertness: Awake/alert Behavior During Therapy: Restless;Impulsive Overall Cognitive Status: Impaired/Different from baseline Area of Impairment: Orientation;Attention;Memory;Following commands;Safety/judgement;Awareness;Problem solving Orientation Level: Disoriented to;Situation Current Attention Level: Focused;Sustained (sustained with max cues ) Memory: Decreased recall of precautions;Decreased short-term memory Following Commands: Follows one step commands consistently (with mod - max cues due to attention ) Safety/Judgement: Decreased awareness of safety;Decreased awareness of deficits Awareness: Intellectual Problem Solving: Slow processing;Decreased initiation;Difficulty sequencing;Requires verbal cues;Requires tactile cues General Comments: Pt noted to perseverate on topics.  Asks same questions repetitively.  Pt with poor awareness of deficits and situation - thinks the bruising and scrapes on his arms are due to blood draws    General Comments       Exercises       Shoulder Instructions      Home Living Family/patient expects to be discharged to:: Private residence Living Arrangements: Alone (girlfriend) Available Help at Discharge: Family Type of Home: Apartment Home Access: Level entry     Home Layout: One level     Bathroom Shower/Tub: [place] unit;Curtain   Biochemist, clinical: [place]: Environmental consultant - 2  wheels;Bedside commode;Tub bench;Hand held shower head   Additional Comments: Pt states that he lives alone, and that daughter is coming from [facility] to assist him, per his report, she has 4 young children that will be travelling with her       Prior Functioning/Environment Level of Independence: Needs assistance  Gait / Transfers Assistance Needed: Uses RW vs cane (cane for house hold mobility and RW for community mobility) ADL's / Homemaking Assistance Needed: Pt reports that his girlfriend, Constance Holster, assists him with bathing. He uses bench and BSC beside bed)   Comments: h/o falls    OT Diagnosis: Generalized weakness;Cognitive deficits   OT Problem List: Decreased strength;Decreased activity tolerance;Impaired balance (sitting and/or standing);Decreased cognition;Decreased safety awareness;Decreased knowledge of use of DME or AE;Decreased knowledge of precautions   OT Treatment/Interventions: Self-care/ADL training;DME and/or AE instruction;Therapeutic activities;Cognitive remediation/compensation;Patient/family education;Balance training    OT Goals(Current goals can be found in the care plan section) Acute Rehab OT Goals Patient Stated Goal: to go home and get his pain medication OT Goal Formulation: With patient Time For Goal Achievement: 01/02/15 Potential to Achieve Goals: Fair ADL Goals Pt Will Perform Grooming: with min guard assist;standing Pt Will Perform Upper Body Bathing: with supervision;sitting Pt Will Perform Lower Body Bathing: with min guard assist;sit to/from stand Pt Will Transfer to Toilet: with min guard assist;ambulating;regular height toilet;bedside commode;grab bars Pt Will Perform Toileting - Clothing Manipulation and hygiene: with min guard assist;sit to/from stand  OT Frequency: Min 2X/week   Barriers to D/C: Decreased caregiver support          Co-evaluation  End of Session Equipment Utilized During Treatment: Surveyor, mining  Communication: Mobility status  Activity Tolerance: Patient tolerated treatment well Patient left: in bed;with call bell/phone within reach;with bed alarm set   Time: 7673-4193 OT Time Calculation (min): 27 min Charges:  OT General Charges $OT Visit: 1 Procedure OT Evaluation $Initial OT Evaluation Tier I: 1 Procedure OT Treatments $Self Care/Home Management : 8-22 mins G-Codes:    Elidia Bonenfant M 2015-01-18, 6:08 PM

## 2014-12-19 NOTE — Clinical Social Work Note (Signed)
Clinical Social Work Assessment  Patient Details  Name: [name] MRN: [id number] Date of Birth: June 08, 1955  Date of referral:  12/19/14               Reason for consult:  Facility Placement, Discharge Planning                Permission sought to share information with:  Family Supports, Customer service manager Constance Holster) Permission granted to share information::  Yes, Verbal Permission Granted  Name::     Constance Holster and Public librarian::  SNF  Relationship::     Contact Information:     Housing/Transportation Living arrangements for the past 2 months:  Apartment Source of Information:  Other (Comment Required) (Significant other Barista) Patient Interpreter Needed:  None Criminal Activity/Legal Involvement Pertinent to Current Situation/Hospitalization:  No - Comment as needed Significant Relationships:  Adult Children, Friend, Siblings Lives with:  Significant Other Do you feel safe going back to the place where you live?  Yes Need for family participation in patient care:  Yes (Comment)  Care giving concerns:  Patient's significant other Constance Holster is very concerned about the patient returning home at discharge.   Social Worker assessment / plan:  CSW met with patient at bedside to complete assessment. Patient has long Hx of ETOH use but he adamantly denies this with CSW.The patient is adamantly refusing SNF placement but CSW questions the patient's ability to make this decision as he sometimes responds inappropriately to CSW's questions and seems to lack insight into his current situation. Per Constance Holster, the patient's daughter is supposed to come stay with the patient in the coming weeks. CSW explained that the patient is going to be ready for DC tomorrow and if the patient cannot return home, he will need to DC to a LOG pending [place] facility. Constance Holster states the patient will want [facility] but [facility] does not have a male bed available. CSW would recommend MD rule on patient's  capacity to make decisions or request psychiatry consult as CSW cannot force patient to go to SNF if he continues to refuse. This was also explained to [place]. CSW explained SNF search/placement process to [facility] and answered her questions.  Employment status:  Disabled (Comment on whether or not currently receiving Disability) Insurance information:  Managed Medicare PT Recommendations:  House / Referral to community resources:  [place]  Patient/Family's Response to care:  Patient is in disagreement with SNF recommendation. Patient's significant other Constance Holster believes the patient needs SNF placement at discharge. Patient appears to be happy with the care he has received.  Patient/Family's Understanding of and Emotional Response to Diagnosis, Current Treatment, and Prognosis:  Patient appears to have little insight into reason for admission, diagnosis, and his post DC needs. He appears to be in denial about the extent of his ETOH problem.  Emotional Assessment Appearance:  Appears older than stated age Attitude/Demeanor/Rapport:  Inconsistent Affect (typically observed):  Flat, Guarded, In denial Orientation:  Oriented to Self, Oriented to Place, Oriented to  Time Alcohol / Substance use:  Alcohol Use Psych involvement (Current and /or in the community):  No (Comment)  Discharge Needs  Concerns to be addressed:  Discharge Planning Concerns Readmission within the last 30 days:  No Current discharge risk:  Chronically ill, Cognitively Impaired, Physical Impairment, Substance Abuse Barriers to Discharge:  Continued Medical Work up   [employer] MSW, [place], [place], [phone]

## 2014-12-20 LAB — GLUCOSE, CAPILLARY
GLUCOSE-CAPILLARY: 146 mg/dL — AB (ref 65–99)
GLUCOSE-CAPILLARY: 155 mg/dL — AB (ref 65–99)

## 2014-12-20 LAB — MAGNESIUM: MAGNESIUM: 1.4 mg/dL — AB (ref 1.7–2.4)

## 2014-12-20 LAB — POTASSIUM: Potassium: 3.9 mmol/L (ref 3.5–5.1)

## 2014-12-20 LAB — AMMONIA: Ammonia: 28 umol/L (ref 9–35)

## 2014-12-20 MED ORDER — FUROSEMIDE 40 MG PO TABS
40.0000 mg | ORAL_TABLET | Freq: Two times a day (BID) | ORAL | Status: DC
  Filled 2014-12-20: qty 1

## 2014-12-20 MED ORDER — FUROSEMIDE 40 MG PO TABS: 40.0000 mg | ORAL_TABLET | Freq: Three times a day (TID) | ORAL | Status: DC

## 2014-12-20 MED ORDER — LANTUS SOLOSTAR 100 UNIT/ML ~~LOC~~ SOPN: 20.0000 [IU] | PEN_INJECTOR | Freq: Every day | SUBCUTANEOUS | Status: DC

## 2014-12-20 MED ORDER — MAGNESIUM SULFATE 2 GM/50ML IV SOLN
2.0000 g | Freq: Once | INTRAVENOUS | Status: DC
  Filled 2014-12-20: qty 50

## 2014-12-20 MED ORDER — CLONAZEPAM 0.25 MG PO TBDP: 0.2500 mg | ORAL_TABLET | Freq: Two times a day (BID) | ORAL | Status: DC

## 2014-12-20 MED ORDER — BOOST / RESOURCE BREEZE PO LIQD: 1.0000 | Freq: Three times a day (TID) | ORAL | Status: AC

## 2014-12-20 MED ORDER — CETYLPYRIDINIUM CHLORIDE 0.05 % MT LIQD
7.0000 mL | Freq: Two times a day (BID) | OROMUCOSAL | Status: DC
  Administered 2014-12-20: 7 mL via OROMUCOSAL

## 2014-12-20 NOTE — Progress Notes (Signed)
CSW Armed forces technical officer) spoke with pt this morning and he declined to dc to SNF in [place]. However, CSW received call from nursing who stated pt has changed his mind. CSW visited pt room to confirm. While pt was able to state he is agreeable to dc to SNF, he was clearly confused during conversation and had to be continuously redirected to SNF discussions. CSW called pt significant other Constance Holster who confirmed she is agreeable to plan for SNF and is aware of plan for dc today to [facility]. Pt will have support from [place] at discharge.  CSW (Clinical Education officer, museum) prepared pt dc packet and placed with shadow chart. CSW arranged non-emergent ambulance transport. Pt, pt family, pt nurse, and facility informed. CSW signing off.  Kennley Schwandt Jenny Reichmann Weekend CSW [phone]

## 2014-12-20 NOTE — Progress Notes (Signed)
CM received call from [place] as pt refusing SNF.  Unfortunately, [facility] and [facility] both refuse to render services pt as they state this is an unsafe environment for independent living.  No other CM needs were communicated.

## 2014-12-20 NOTE — Discharge Summary (Addendum)
[name], is a 59 y.o. male  DOB August 05, 1955  MRN [id number].  Admission date:  12/14/2014  Admitting Physician  Collene Gobble, MD  Discharge Date:  12/20/2014   Primary MD  Barbette Merino, MD  Recommendations for primary care physician for things to follow:   Check CBC, CMP, magnesium in 3-4 days. Check ammonia level in 3-4 days.  Remains high fall risk, activity with assistance and fall precautions   Admission Diagnosis  Hepatic encephalopathy [K72.90] Acute respiratory failure, unspecified whether with hypoxia or hypercapnia [J96.00]   Discharge Diagnosis  Hepatic encephalopathy [K72.90] Acute respiratory failure, unspecified whether with hypoxia or hypercapnia [J96.00]    Active Problems:   Diabetes mellitus   Thrombocytopenia   Cirrhosis   Hepatic encephalopathy   Acute respiratory failure   Acute renal failure   Pressure ulcer   Protein-calorie malnutrition, severe      Past Medical History  Diagnosis Date  . Neuropathy   . Diabetes mellitus   . Bipolar affect, depressed   . Hypertension   . Arthritis   . Stroke     Mini stroke about 59yrs ago  . Cirrhosis   . Alcohol abuse   . Chronic pain   . Cocaine abuse   . Muscle spasm     both legs  . Encephalopathy, hepatic   . Detached retina   . COPD (chronic obstructive pulmonary disease)     emphysema  . Bronchitis   . Barrett's esophagus   . GERD (gastroesophageal reflux disease)     has ulcer  . Anemia     Past Surgical History  Procedure Laterality Date  . Fracture surgery      Leg and arm 16yrs ago  . Esophagogastroduodenoscopy  04/04/2012    Procedure: ESOPHAGOGASTRODUODENOSCOPY (EGD);  Surgeon: Irene Shipper, MD;  Location: [facility] ENDOSCOPY;  Service: Endoscopy;  Laterality: N/A;  . Esophagogastroduodenoscopy Left 03/13/2013   Procedure: ESOPHAGOGASTRODUODENOSCOPY (EGD);  Surgeon: Arta Silence, MD;  Location: [facility] ENDOSCOPY;  Service: Endoscopy;  Laterality: Left;  Marland Kitchen Eye surgery  8 months ago both eyes    cataracts both eyes, detached eye, gas pocket  . Vasectomy    . Pars plana vitrectomy Left 07/08/2013    Procedure: PARS PLANA VITRECTOMY WITH 25 GAUGE;  Surgeon: Hurman Horn, MD;  Location: [place];  Service: Ophthalmology;  Laterality: Left;  . Intraocular lens removal Left 07/08/2013    Procedure: REMOVAL OF INTRAOCULAR LENS;  Surgeon: Hurman Horn, MD;  Location: [place];  Service: Ophthalmology;  Laterality: Left;  . Placement and suture of secondary intraocular lens Left 07/08/2013    Procedure: PLACEMENT AND SUTURE OF SECONDARY INTRAOCULAR LENS;  Surgeon: Hurman Horn, MD;  Location: [place];  Service: Ophthalmology;  Laterality: Left;  Insertion of Anterior Capsule Intraocular Lens   . Esophagogastroduodenoscopy N/A 01/16/2014    Procedure: ESOPHAGOGASTRODUODENOSCOPY (EGD);  Surgeon: Winfield Cunas., MD;  Location: [facility] ENDOSCOPY;  Service: Endoscopy;  Laterality: N/A;  . Colonoscopy N/A 01/17/2014  Procedure: COLONOSCOPY;  Surgeon: Winfield Cunas., MD;  Location: [facility] ENDOSCOPY;  Service: Endoscopy;  Laterality: N/A;  possible banding  . Esophagogastroduodenoscopy N/A 08/30/2014    Procedure: ESOPHAGOGASTRODUODENOSCOPY (EGD);  Surgeon: Wilford Corner, MD;  Location: [facility] ENDOSCOPY;  Service: Endoscopy;  Laterality: N/A;  bedside  . Esophagogastroduodenoscopy N/A 11/30/2014    Procedure: ESOPHAGOGASTRODUODENOSCOPY (EGD);  Surgeon: Wilford Corner, MD;  Location: [facility] ENDOSCOPY;  Service: Endoscopy;  Laterality: N/A;  . Esophagogastroduodenoscopy N/A 12/15/2014    Procedure: ESOPHAGOGASTRODUODENOSCOPY (EGD);  Surgeon: Teena Irani, MD;  Location: [facility] ENDOSCOPY;  Service: Endoscopy;  Laterality: N/A;       HPI  :     59 yo male with ETOH cirrhosis, portal HTN with varices and ascites found unresponsive at home  covered in vomit. Found to have ammonia level of 252. Intubated in ER on . PCCM admitted  Recurrent hospitalizations for hepatic encephalopathy, diastolic HF, type 2 diabetes, hx of cocaine and alcohol abuse. He was recently discharged from the medicine service (on 12/05/14) from GI bleed due to esophageal varices. He had an EGD on 7/10 that showed portal gastropathy and varices with no hemorrhage, and bleeding spontaneously resolved.   On 7/24 patient was found unresponsive at home with non-bloody vomiting. He was admitted to the ICU from the ED after being intubated. Head CT was negative, paracentesis was negative for SBP, so due to his high ammonia levels, and unreponsiveness on vent, patient is being treated for hepatic encephalopathy and liver failure symptoms with octreotide, protonix, lactulose, and xifaxin.     Hospital Course:     1. Hepatic encephalopathy in a patient with alcoholic cirrhosis noncompliant with his medications, ammonia of 252 upon admission. Was found unresponsive on 12/14/2014 requiring intubation and mechanical ventilation for airway protection. Was extubated 12/16/2014 and transferred to my service on 12/18/2014.  Currently on lactulose and Xifaxan, mentation much improved, protecting airway, oxygen nebulizer treatments as needed. Counseled on compliance with medications. He has very poor compliance at home, does have generalized weakness will go to SNF. Kindly ensure patient takes his lactulose and Xifaxan on time. Check CBC, CMP, magnesium and ammonia in 3-4 days please.   2. Alcoholic cirrhosis with history of gastric and esophageal varices, history of alcohol and cocaine use - seen by GI this admission, continue lactulose and Xifaxan as above, no evidence of SBP this admission paracentesis per GI was unremarkable, no evidence of GI bleed this admission. Continue supportive care. Counseled to quit alcohol and cocaine. Has been using both till recently.   We'll continue  diuresis, the went another therapeutic ultrasound-guided paracentesis on 12/19/2014 with 5 L of ascites fluid removal.    3. Thrombocytopenia due to #2 above. Stable no evidence of ongoing bleed.    4. ARF upon admission. Due to fluid overload and decompensation, improving with gentle diuresis. X CMP in 3-4 days.   5. Hypokalemia. Replaced. Hypomagnesemia replaced. Recheck CMP and magnesium in 3-4 days.    6. Chr. Diastolic CHF with EF 88%. Compensated, third space due to low albumin, continue diuresis.    7. DM type II. Lantus 20 units subcutaneous daily at bedtime home with sliding scale NovoLog.  Lab Results  Component Value Date   HGBA1C 5.9* 08/14/2014    CBG (last 3)   Recent Labs  12/19/14 1704 12/19/14 2054 12/20/14 0832  GLUCAP 187* 183* 146*           Discharge Condition: Fair  Follow UP  Follow-up Information  Follow up with GARBA,LAWAL, MD. Schedule an appointment as soon as possible for a visit in 1 week.   Specialty:  Internal Medicine   Contact information:   [address] [place] [address] [phone]       Follow up with HAYES,JOHN C, MD. Schedule an appointment as soon as possible for a visit in 1 week.   Specialty:  Gastroenterology   Contact information:   [address] [place] [address] [phone]        Consults obtained - PCCM, GI, IR  Diet and Activity recommendation: See Discharge Instructions below  Discharge Instructions           Discharge Instructions    Discharge instructions    Complete by:  As directed   Follow with Primary MD Barbette Merino, MD in 7 days   Get CBC, CMP, 2 view Chest X ray checked  by Primary MD next visit.    Activity: As tolerated with Full fall precautions use walker/cane & assistance as needed   Disposition SNF   Diet: Heart Healthy Low Carb Check your Weight same time everyday, if you gain over 2 pounds, or you develop in leg swelling, experience  more shortness of breath or chest pain, call your Primary MD immediately. Follow Cardiac Low Salt Diet and 1.2 lit/day fluid restriction.  Accuchecks 4 times/day, Once in AM empty stomach and then before each meal. Log in all results and show them to your Prim.MD in 3 days. If any glucose reading is under 80 or above 300 call your Prim MD immidiately. Follow Low glucose instructions for glucose under 80 as instructed.   On your next visit with your primary care physician please Get Medicines reviewed and adjusted.   Please request your Prim.MD to go over all Hospital Tests and Procedure/Radiological results at the follow up, please get all Hospital records sent to your Prim MD by signing hospital release before you go home.   If you experience worsening of your admission symptoms, develop shortness of breath, life threatening emergency, suicidal or homicidal thoughts you must seek medical attention immediately by calling 911 or calling your MD immediately  if symptoms less severe.  You Must read complete instructions/literature along with all the possible adverse reactions/side effects for all the Medicines you take and that have been prescribed to you. Take any new Medicines after you have completely understood and accpet all the possible adverse reactions/side effects.   Do not drive, operating heavy machinery, perform activities at heights, swimming or participation in water activities or provide baby sitting services if your were admitted for syncope or siezures until you have seen by Primary MD or a Neurologist and advised to do so again.  Do not drive when taking Pain medications.    Do not take more than prescribed Pain, Sleep and Anxiety Medications  Special Instructions: If you have smoked or chewed Tobacco  in the last 2 yrs please stop smoking, stop any regular Alcohol  and or any Recreational drug use.  Wear Seat belts while driving.   Please note  You were cared for by a  hospitalist during your hospital stay. If you have any questions about your discharge medications or the care you received while you were in the hospital after you are discharged, you can call the unit and asked to speak with the hospitalist on call if the hospitalist that took care of you is not available. Once you are discharged, your primary care physician will  handle any further medical issues. Please note that NO REFILLS for any discharge medications will be authorized once you are discharged, as it is imperative that you return to your primary care physician (or establish a relationship with a primary care physician if you do not have one) for your aftercare needs so that they can reassess your need for medications and monitor your lab values.     Increase activity slowly    Complete by:  As directed              Discharge Medications       Medication List    STOP taking these medications        diazepam 5 MG tablet  Commonly known as:  VALIUM      TAKE these medications        amitriptyline 25 MG tablet  Commonly known as:  ELAVIL  Take 1 tablet (25 mg total) by mouth at bedtime.     CALCIUM-VITAMIN D PO  Take 100 Units by mouth daily.     clonazePAM 0.25 MG disintegrating tablet  Commonly known as:  KLONOPIN  Take 1 tablet (0.25 mg total) by mouth 2 (two) times daily.     diclofenac sodium 1 % Gel  Commonly known as:  VOLTAREN  Apply 4 g topically as needed (for back of leg pain).     feeding supplement (ENSURE ENLIVE) Liqd  Take 237 mLs by mouth 2 (two) times daily between meals.     feeding supplement Liqd  Take 1 Container by mouth 3 (three) times daily between meals.     folic acid 1 MG tablet  Commonly known as:  FOLVITE  Take 1 tablet (1 mg total) by mouth daily.     furosemide 40 MG tablet  Commonly known as:  LASIX  Take 1 tablet (40 mg total) by mouth 3 (three) times daily.     lactulose 10 GM/15ML solution  Commonly known as:  CHRONULAC  Take 45  mLs (30 g total) by mouth 3 (three) times daily.     LANTUS SOLOSTAR 100 UNIT/ML Solostar Pen  Generic drug:  Insulin Glargine  Inject 20 Units into the skin daily at 10 pm.     magnesium oxide 400 (241.3 MG) MG tablet  Commonly known as:  MAG-OX  Take 1 tablet (400 mg total) by mouth 2 (two) times daily.     multivitamin with minerals Tabs tablet  Take 1 tablet by mouth daily.     NOVOLOG FLEXPEN 100 UNIT/ML FlexPen  Generic drug:  insulin aspart  Inject 6 Units into the skin 3 (three) times daily with meals.     Oxycodone HCl 10 MG Tabs  Take 10 mg by mouth 3 (three) times daily.     pantoprazole 40 MG tablet  Commonly known as:  PROTONIX  Take 1 tablet (40 mg total) by mouth daily.     propranolol 20 MG tablet  Commonly known as:  INDERAL  Take 20 mg by mouth 2 (two) times daily.     spironolactone 50 MG tablet  Commonly known as:  ALDACTONE  Take 1 tablet (50 mg total) by mouth daily.     thiamine 100 MG tablet  Commonly known as:  VITAMIN B-1  Take 100 mg by mouth daily.     XIFAXAN 550 MG Tabs tablet  Generic drug:  rifaximin  Take 550 mg by mouth 2 (two) times daily.        Major procedures  and Radiology Reports - PLEASE review detailed and final reports for all details, in brief -    7/24 CT head and C spine - chronic atrophy and microvascular ischemia, no acute process   7/24 paracentesis - negative for SBP  12/19/2014 repeat ultrasound-guided therapeutic paracentesis with 5Lit fluid removed   Dg Chest 2 View  12/09/2014   CLINICAL DATA:  Shortness of breath.  Abdominal distention.  EXAM: CHEST  2 VIEW  COMPARISON:  11/29/2014  FINDINGS: The heart size and mediastinal contours are within normal limits. Both lungs are clear. The visualized skeletal structures are unremarkable.  IMPRESSION: No active cardiopulmonary disease.   Electronically Signed   By: Lorriane Shire M.D.   On: 12/09/2014 11:33   Dg Chest 2 View  11/29/2014   CLINICAL DATA:  Lower  extremity edema, abdominal distention x1 week  EXAM: CHEST - 2 VIEW  COMPARISON:  11/10/2014  FINDINGS: Improved aeration. Lungs are clear. Heart size normal. No pneumothorax. No effusion. Visualized skeletal structures are unremarkable.  IMPRESSION: No acute cardiopulmonary disease.   Electronically Signed   By: Lucrezia Europe M.D.   On: 11/29/2014 12:50   Ct Head Wo Contrast  12/14/2014   CLINICAL DATA:  Patient found unresponsive today. Initial encounter.  EXAM: CT HEAD WITHOUT CONTRAST  CT CERVICAL SPINE WITHOUT CONTRAST  TECHNIQUE: Multidetector CT imaging of the head and cervical spine was performed following the standard protocol without intravenous contrast. Multiplanar CT image reconstructions of the cervical spine were also generated.  COMPARISON:  Head CT scan 11/10/2014. Head and cervical spine CT scans 08/29/2014.  FINDINGS: CT HEAD FINDINGS  There is advanced for age appearing cortical atrophy. There is also some chronic microvascular ischemic change. No evidence of acute abnormality including hemorrhage, infarct, mass lesion, mass effect, midline shift or abnormal extra-axial fluid collection is identified. The calvarium is intact. Imaged paranasal sinuses and mastoid air cells are clear.  CT CERVICAL SPINE FINDINGS  There is no fracture or malalignment of the cervical spine. Endotracheal tube and OG tube are in place. Intervertebral disc space height is maintained. Lung apices are clear.  IMPRESSION: No acute abnormality head or cervical spine.  Atrophy and chronic microvascular ischemic change.   Electronically Signed   By: Inge Rise M.D.   On: 12/14/2014 10:02   Ct Cervical Spine Wo Contrast  12/14/2014   CLINICAL DATA:  Patient found unresponsive today. Initial encounter.  EXAM: CT HEAD WITHOUT CONTRAST  CT CERVICAL SPINE WITHOUT CONTRAST  TECHNIQUE: Multidetector CT imaging of the head and cervical spine was performed following the standard protocol without intravenous contrast.  Multiplanar CT image reconstructions of the cervical spine were also generated.  COMPARISON:  Head CT scan 11/10/2014. Head and cervical spine CT scans 08/29/2014.  FINDINGS: CT HEAD FINDINGS  There is advanced for age appearing cortical atrophy. There is also some chronic microvascular ischemic change. No evidence of acute abnormality including hemorrhage, infarct, mass lesion, mass effect, midline shift or abnormal extra-axial fluid collection is identified. The calvarium is intact. Imaged paranasal sinuses and mastoid air cells are clear.  CT CERVICAL SPINE FINDINGS  There is no fracture or malalignment of the cervical spine. Endotracheal tube and OG tube are in place. Intervertebral disc space height is maintained. Lung apices are clear.  IMPRESSION: No acute abnormality head or cervical spine.  Atrophy and chronic microvascular ischemic change.   Electronically Signed   By: Inge Rise M.D.   On: 12/14/2014 10:02   US Abdomen Complete  12/10/2014   CLINICAL DATA:  Cirrhosis.  EXAM: ULTRASOUND ABDOMEN COMPLETE  COMPARISON:  CT 09/17/2014.  FINDINGS: Gallbladder: 4 mm gallbladder polyp. Prominent gallbladder wall thickening at 9.6 mm. This most likely secondary to hyperproteinemia. Similar findings noted on prior CT. Acalculous cholecystitis cannot be excluded . No gallstones. Negative Murphy sign.  Common bile duct: Diameter: 3.9 mm  Liver: Small nodular liver consistent with cirrhosis again noted. Focal ill-defined area of hypo echogenicity noted in the medial aspect of the left lobe of the liver. A focal hepatic mass cannot be completely excluded. Portal vein is patent.  IVC: No abnormality visualized.  Pancreas: Not visualized.  Spleen: Splenomegaly to 15.2 cm.  Right Kidney: Length: 12.0 cm. Echogenicity within normal limits. No mass or hydronephrosis visualized.  Left Kidney: Length: 11.9 cm. Echogenicity within normal limits. No mass or hydronephrosis visualized.  Abdominal aorta: No aneurysm  visualized.  Other findings: Moderate ascites.  IMPRESSION: 1. Small nodular liver again noted consistent with cirrhosis. On today's exam there is a focal area of ill-defined hypo echogenicity in the medial aspect of the left lobe liver. A focal hepatic mass cannot be completely excluded. Gadolinium-enhanced MRI the liver can be obtained for further evaluation as clinically indicated. Associated splenomegaly noted. 2. Moderate ascites. 3. Thickened gallbladder wall again noted. This is most likely from hypoproteinemia. No gallstones noted. Tiny 4 mm gallbladder polyp noted.   Electronically Signed   By: Marcello Moores  Register   On: 12/10/2014 11:00   US Paracentesis  12/19/2014   CLINICAL DATA:  Ascites secondary to alcoholic cirrhosis  EXAM: ULTRASOUND GUIDED RIGHT LOWER QUADRANT PARACENTESIS  COMPARISON:  None.  PROCEDURE: An ultrasound guided paracentesis was thoroughly discussed with the patient and questions answered. The benefits, risks, alternatives and complications were also discussed. The patient understands and wishes to proceed with the procedure. Written consent was obtained.  Ultrasound was performed to localize and mark an adequate pocket of fluid in the right lower quadrant of the abdomen. The area was then prepped and draped in the normal sterile fashion. 1% Lidocaine was used for local anesthesia. Under ultrasound guidance a 19 gauge Yueh catheter was introduced. Paracentesis was performed. The catheter was removed and a dressing applied.  COMPLICATIONS: None.  FINDINGS: A total of approximately 5 liters of clear yellow fluid was removed. A fluid sample was not sent for laboratory analysis.  IMPRESSION: Successful ultrasound guided paracentesis yielding 5 liters of ascites.  Read by:  Gareth Eagle, PA-C   Electronically Signed   By: Jerilynn Mages.  Shick M.D.   On: 12/19/2014 11:19   US Paracentesis  12/10/2014   INDICATION: ascites  EXAM: ULTRASOUND-GUIDED PARACENTESIS  COMPARISON:  Previous para  MEDICATIONS:  10 cc 1% lidocaine  COMPLICATIONS: None immediate  TECHNIQUE: Informed written consent was obtained from the patient after a discussion of the risks, benefits and alternatives to treatment. A timeout was performed prior to the initiation of the procedure.  Initial ultrasound scanning demonstrates a large amount of ascites within the right lower abdominal quadrant. The right lower abdomen was prepped and draped in the usual sterile fashion. 1% lidocaine with epinephrine was used for local anesthesia. Under direct ultrasound guidance, a 19 gauge, 7-cm, Yueh catheter was introduced. An ultrasound image was saved for documentation purposed. The paracentesis was performed. The catheter was removed and a dressing was applied. The patient tolerated the procedure well without immediate post procedural complication.  FINDINGS: A total of approximately 5 liters of pale yellow fluid was removed.  IMPRESSION:  Successful ultrasound-guided paracentesis yielding 5 liters of peritoneal fluid.  Read by:  Lavonia Drafts [facility]   Electronically Signed   By: Marybelle Killings M.D.   On: 12/10/2014 16:30   US Paracentesis  11/29/2014   CLINICAL DATA:  Abdominal distention, ascites. Request diagnostic and therapeutic paracentesis of up to 5 L max  EXAM: ULTRASOUND GUIDED PARACENTESIS  COMPARISON:  Previous paracentesis  PROCEDURE: An ultrasound guided paracentesis was thoroughly discussed with the patient and questions answered. The benefits, risks, alternatives and complications were also discussed. The patient understands and wishes to proceed with the procedure. Written consent was obtained.  Ultrasound was performed to localize and mark an adequate pocket of fluid in the left lower quadrant of the abdomen. The area was then prepped and draped in the normal sterile fashion. 1% Lidocaine was used for local anesthesia. Under ultrasound guidance a Safe-T-Centesis catheter was introduced. Paracentesis was performed. The catheter was removed  and a dressing applied.  COMPLICATIONS: None immediate  FINDINGS: A total of approximately 5 L of . Clear yellow. fluid was removed. A fluid sample was sent for laboratory analysis.  IMPRESSION: Successful ultrasound guided paracentesis yielding 5 L of ascites.  Read by: Ascencion Dike PA-C   Electronically Signed   By: Marybelle Killings M.D.   On: 11/29/2014 14:07   Dg Chest Port 1 View  12/16/2014   CLINICAL DATA:  Acute respiratory failure  EXAM: PORTABLE CHEST - 1 VIEW  COMPARISON:  Portable chest x-ray of December 14, 2014  FINDINGS: The lungs are better inflated today. The interstitial markings remain mildly increased but have improved. There is no pleural effusion or pneumothorax. The cardiac silhouette is enlarged. The central pulmonary vascularity is prominent. The endotracheal tube tip lies 4.2 cm above the carina. The esophagogastric tube tip projects below the inferior margin of the image. The bony thorax exhibits no acute abnormality.  IMPRESSION: Interval improvement in the aeration of both lungs with decreased interstitial edema. Mild interstitial edema persists likely secondary to CHF. The support tubes are in reasonable position today.   Electronically Signed   By: David  Martinique M.D.   On: 12/16/2014 08:13   Dg Chest Portable 1 View  12/14/2014   CLINICAL DATA:  Patient unresponsive.  Status post intubation.  EXAM: PORTABLE CHEST - 1 VIEW  COMPARISON:  PA and lateral chest 12/09/2014.  FINDINGS: Endotracheal tube is in place with the tip approximately 0.5 cm above the carina. Recommend withdrawal of 1.5-2 cm. NG tube courses into the stomach and below the inferior margin of film. Lung volumes are low with basilar atelectasis. Heart size is normal.  IMPRESSION: ETT tip projects 0.5 cm above the carina. Recommend withdrawal of 1.5-2 cm.  Basilar atelectasis in a low volume chest.  Critical Value/emergent results were called by telephone at the time of interpretation on 12/14/2014 at 9:31 am to Dr. Ezequiel Essex , who verbally acknowledged these results.   Electronically Signed   By: Inge Rise M.D.   On: 12/14/2014 09:32    Micro Results      Recent Results (from the past 240 hour(s))  Urine culture     Status: None   Collection Time: 12/14/14  9:15 AM  Result Value Ref Range Status   Specimen Description URINE, CATHETERIZED  Final   Special Requests NONE  Final   Culture NO GROWTH 1 DAY  Final   Report Status 12/15/2014 FINAL  Final  MRSA PCR Screening     Status: None  Collection Time: 12/14/14  1:57 PM  Result Value Ref Range Status   MRSA by PCR NEGATIVE NEGATIVE Final    Comment:        The GeneXpert MRSA Assay (FDA approved for NASAL specimens only), is one component of a comprehensive MRSA colonization surveillance program. It is not intended to diagnose MRSA infection nor to guide or monitor treatment for MRSA infections.   Urine culture     Status: None   Collection Time: 12/14/14  2:41 PM  Result Value Ref Range Status   Specimen Description URINE, CATHETERIZED  Final   Special Requests NONE  Final   Culture NO GROWTH 1 DAY  Final   Report Status 12/15/2014 FINAL  Final  Culture, body fluid-bottle     Status: None (Preliminary result)   Collection Time: 12/14/14  2:58 PM  Result Value Ref Range Status   Specimen Description PERITONEAL  Final   Special Requests NONE  Final   Gram Stain   Final    GRAM POSITIVE COCCI IN CLUSTERS AEROBIC BOTTLE ONLY CRITICAL RESULT CALLED TO, READ BACK BY AND VERIFIED WITH: S BOWIE,RN AT [id number] 12/17/14 BY L BENFIELD    Culture   Final    STAPHYLOCOCCUS SPECIES (COAGULASE NEGATIVE) SENSITIVITIES TO FOLLOW    Report Status PENDING  Incomplete  Gram stain     Status: None   Collection Time: 12/14/14  2:58 PM  Result Value Ref Range Status   Specimen Description PERITONEAL  Final   Special Requests NONE  Final   Gram Stain MODERATE POLYMORPHONUCLEAR NO ORGANISMS SEEN   Final   Report Status 12/14/2014 FINAL  Final    Culture, blood (routine x 2)     Status: None   Collection Time: 12/14/14  4:10 PM  Result Value Ref Range Status   Specimen Description BLOOD RIGHT ARM  Final   Special Requests BOTTLES DRAWN AEROBIC AND ANAEROBIC 10CC  Final   Culture NO GROWTH 5 DAYS  Final   Report Status 12/19/2014 FINAL  Final  Culture, blood (routine x 2)     Status: None   Collection Time: 12/14/14  4:20 PM  Result Value Ref Range Status   Specimen Description BLOOD RIGHT ARM  Final   Special Requests BOTTLES DRAWN AEROBIC AND ANAEROBIC 10CC  Final   Culture NO GROWTH 5 DAYS  Final   Report Status 12/19/2014 FINAL  Final       Today   Subjective    [name] today has no headache,no chest abdominal pain,no new weakness tingling or numbness, feels much better wants.   Objective   Blood pressure 107/53, pulse 103, temperature 98.3 F (36.8 C), temperature source Oral, resp. rate 20, height 5\' 11"  (1.803 m), weight 91.8 kg (202 lb 6.1 oz), SpO2 99 %.   Intake/Output Summary (Last 24 hours) at 12/20/14 1158 Last data filed at 12/20/14 1134  Gross per 24 hour  Intake    580 ml  Output   1845 ml  Net  -1265 ml    Exam Awake Alert, Oriented x 3, No new F.N deficits, Normal affect [place].AT,PERRAL Supple Neck,No JVD, No cervical lymphadenopathy appriciated.  Symmetrical Chest wall movement, Good air movement bilaterally, CTAB RRR,No Gallops,Rubs or new Murmurs, No Parasternal Heave +ve B.Sounds, Abd Soft ascites, Non tender, No organomegaly appriciated, No rebound -guarding or rigidity. No Cyanosis, Clubbing or edema, No new Rash , multiple generalized bruises   Data Review   CBC w Diff:  Lab Results  Component  Value Date   WBC 4.3 12/18/2014   WBC 3.7* 04/16/2012   HGB 9.0* 12/18/2014   HGB 10.2* 04/16/2012   HCT 26.1* 12/18/2014   HCT 33.4* 04/16/2012   PLT 49* 12/18/2014   LYMPHOPCT 16 12/16/2014   MONOPCT 12 12/16/2014   EOSPCT 8* 12/16/2014   BASOPCT 1 12/16/2014    CMP:  Lab  Results  Component Value Date   NA 135 12/18/2014   K 3.9 12/20/2014   CL 104 12/18/2014   CO2 24 12/18/2014   BUN 49* 12/18/2014   CREATININE 1.48* 12/18/2014   CREATININE 0.62 04/16/2012   PROT 5.1* 12/18/2014   ALBUMIN 2.1* 12/18/2014   BILITOT 4.2* 12/18/2014   ALKPHOS 94 12/18/2014   AST 64* 12/18/2014   ALT 32 12/18/2014  . Lab Results  Component Value Date   HGBA1C 5.9* 08/14/2014     Total Time in preparing paper work, data evaluation and todays exam - 35 minutes  Thurnell Lose M.D on 12/20/2014 at 11:58 AM  [facility]   Office  [phone]

## 2014-12-20 NOTE — Progress Notes (Signed)
Patient Demographics:    [name], is a 59 y.o. male, DOB - October 26, 1955, MKL:[id number]  Admit date - 12/14/2014   Admitting Physician Collene Gobble, MD  Outpatient Primary MD for the patient is Barbette Merino, MD  LOS - 6   Chief Complaint  Patient presents with  . Altered Mental Status      Summary  59 yo male with ETOH cirrhosis, portal HTN with varices and ascites found unresponsive at home covered in vomit. Found to have ammonia level of 252. Intubated in ER on . PCCM admitted  Recurrent hospitalizations for hepatic encephalopathy, diastolic HF, type 2 diabetes, hx of cocaine and alcohol abuse. He was recently discharged from the medicine service (on 12/05/14) from GI bleed due to esophageal varices. He had an EGD on 7/10 that showed portal gastropathy and varices with no hemorrhage, and bleeding spontaneously resolved.   On 7/24 patient was found unresponsive at home with non-bloody vomiting. He was admitted to the ICU from the ED after being intubated. Head CT was negative, paracentesis was negative for SBP, so due to his high ammonia levels, and unreponsiveness on vent, patient is being treated for hepatic encephalopathy and liver failure symptoms with octreotide, protonix, lactulose, and xifaxin.     Subjective:    [name] today has, No headache, No chest pain, No abdominal pain - No Nausea, No new weakness tingling or numbness, No Cough - SOB.     Assessment  & Plan :     1. Hepatic encephalopathy in a patient with alcoholic cirrhosis noncompliant with his medications, ammonia of 252 upon admission. Was found unresponsive on 12/14/2014 requiring intubation and mechanical ventilation for airway protection. Was extubated 12/16/2014 and transferred to my service on 12/18/2014.  Currently on  lactulose and Xifaxan, mentation much improved, protecting airway, oxygen nebulizer treatments as needed. Counseled on compliance with medications.   2. Alcoholic cirrhosis with history of gastric and esophageal varices, history of alcohol and cocaine use - seen by GI this admission, continue lactulose and Xifaxan as above, no evidence of SBP this admission paracentesis per GI was unremarkable, no evidence of GI bleed this admission. Continue supportive care. Counseled to quit alcohol and cocaine. Has been using both till recently.   We'll continue diuresis, have requested IR to do another therapeutic paracentesis as his ascites seems to have become quite large.    3. Thrombocytopenia due to #2 above. Stable no evidence of ongoing bleed.    4. ARF upon admission. Due to fluid overload and decompensation, improving with gentle diuresis. We'll continue to monitor potassium closely while on Aldactone.   5. Hypokalemia. Replaced. Hypomagnesemia replaced.    6. Chr. Diastolic CHF with EF 69%. Compensated, third space due to low albumin, continue diuresis.    7. DM type II. On sliding scale continue.  Lab Results  Component Value Date   HGBA1C 5.9* 08/14/2014    CBG (last 3)   Recent Labs  12/19/14 1704 12/19/14 2054 12/20/14 0832  GLUCAP 187* 183* 146*       Code Status : Full  Family Communication  :None  Disposition Plan  : TBD  Consults  : PCCM, GI, IR  Procedures  :   7/24 CT head and C spine -  chronic atrophy and microvascular ischemia, no acute process   7/24 paracentesis - negative for SBP  12/19/2014 repeat ultrasound-guided therapeutic paracentesis with 5Lit fluid removed  DVT Prophylaxis  :   SCDs   Lab Results  Component Value Date   PLT 49* 12/18/2014    Inpatient Medications  Scheduled Meds: . antiseptic oral rinse  7 mL Mouth Rinse BID  . clonazePAM  0.25 mg Oral QHS  . feeding supplement  1 Container Oral TID BM  . feeding supplement  (ENSURE ENLIVE)  237 mL Oral BID BM  . folic acid  1 mg Oral Daily  . furosemide  40 mg Intravenous Q6H  . insulin aspart  0-9 Units Subcutaneous TID WC  . insulin glargine  20 Units Subcutaneous QHS  . lactulose  30 g Per Tube TID  . magnesium sulfate 1 - 4 g bolus IVPB  2 g Intravenous Once  . pantoprazole  40 mg Oral BID  . rifaximin  550 mg Per Tube BID  . spironolactone  50 mg Oral Daily  . thiamine  100 mg Oral Daily   Continuous Infusions: . sodium chloride 10 mL/hr at 12/17/14 0700   PRN Meds:.albuterol  Antibiotics  :     Anti-infectives    Start     Dose/Rate Route Frequency Ordered Stop   12/15/14 0100  cefTAZidime (FORTAZ) 2 g in dextrose 5 % 50 mL IVPB  Status:  Discontinued     2 g 100 mL/hr over 30 Minutes Intravenous Every 12 hours 12/14/14 1445 12/16/14 0751   12/14/14 1500  rifaximin (XIFAXAN) tablet 550 mg     550 mg Per Tube 2 times daily 12/14/14 1353     12/14/14 1230  cefTAZidime (FORTAZ) 1 g in dextrose 5 % 50 mL IVPB  Status:  Discontinued     1 g 100 mL/hr over 30 Minutes Intravenous Every 12 hours 12/14/14 1202 12/14/14 1445        Objective:   Filed Vitals:   12/19/14 1025 12/19/14 1429 12/19/14 2057 12/20/14 0707  BP: 115/66 102/57 104/55 107/53  Pulse:  75 82 103  Temp:  98 F (36.7 C) 99.1 F (37.3 C) 98.3 F (36.8 C)  TempSrc:  Oral Oral Oral  Resp:  16 17 20   Height:      Weight:    91.8 kg (202 lb 6.1 oz)  SpO2:  100% 100% 99%    Wt Readings from Last 3 Encounters:  12/20/14 91.8 kg (202 lb 6.1 oz)  11/30/14 99.837 kg (220 lb 1.6 oz)  11/10/14 102.2 kg (225 lb 5 oz)     Intake/Output Summary (Last 24 hours) at 12/20/14 1000 Last data filed at 12/20/14 0727  Gross per 24 hour  Intake    340 ml  Output   2170 ml  Net  -1830 ml     Physical Exam  Awake Alert, Oriented X 2, No new F.N deficits, Normal affect [place].AT,PERRAL Supple Neck,No JVD, No cervical lymphadenopathy appriciated.  Symmetrical Chest wall movement,  Good air movement bilaterally, CTAB RRR,No Gallops,Rubs or new Murmurs, No Parasternal Heave +ve B.Sounds, + Ascites, No tenderness, No organomegaly appriciated, No rebound - guarding or rigidity. No Cyanosis, Clubbing or edema, No new Rash , multiple bruises      Data Review:   Micro Results Recent Results (from the past 240 hour(s))  Urine culture     Status: None   Collection Time: 12/14/14  9:15 AM  Result Value Ref Range Status  Specimen Description URINE, CATHETERIZED  Final   Special Requests NONE  Final   Culture NO GROWTH 1 DAY  Final   Report Status 12/15/2014 FINAL  Final  MRSA PCR Screening     Status: None   Collection Time: 12/14/14  1:57 PM  Result Value Ref Range Status   MRSA by PCR NEGATIVE NEGATIVE Final    Comment:        The GeneXpert MRSA Assay (FDA approved for NASAL specimens only), is one component of a comprehensive MRSA colonization surveillance program. It is not intended to diagnose MRSA infection nor to guide or monitor treatment for MRSA infections.   Urine culture     Status: None   Collection Time: 12/14/14  2:41 PM  Result Value Ref Range Status   Specimen Description URINE, CATHETERIZED  Final   Special Requests NONE  Final   Culture NO GROWTH 1 DAY  Final   Report Status 12/15/2014 FINAL  Final  Culture, body fluid-bottle     Status: None (Preliminary result)   Collection Time: 12/14/14  2:58 PM  Result Value Ref Range Status   Specimen Description PERITONEAL  Final   Special Requests NONE  Final   Gram Stain   Final    GRAM POSITIVE COCCI IN CLUSTERS AEROBIC BOTTLE ONLY CRITICAL RESULT CALLED TO, READ BACK BY AND VERIFIED WITH: S BOWIE,RN AT 8366 12/17/14 BY L BENFIELD    Culture STAPHYLOCOCCUS SPECIES (COAGULASE NEGATIVE)  Final   Report Status PENDING  Incomplete  Gram stain     Status: None   Collection Time: 12/14/14  2:58 PM  Result Value Ref Range Status   Specimen Description PERITONEAL  Final   Special Requests NONE   Final   Gram Stain MODERATE POLYMORPHONUCLEAR NO ORGANISMS SEEN   Final   Report Status 12/14/2014 FINAL  Final  Culture, blood (routine x 2)     Status: None   Collection Time: 12/14/14  4:10 PM  Result Value Ref Range Status   Specimen Description BLOOD RIGHT ARM  Final   Special Requests BOTTLES DRAWN AEROBIC AND ANAEROBIC 10CC  Final   Culture NO GROWTH 5 DAYS  Final   Report Status 12/19/2014 FINAL  Final  Culture, blood (routine x 2)     Status: None   Collection Time: 12/14/14  4:20 PM  Result Value Ref Range Status   Specimen Description BLOOD RIGHT ARM  Final   Special Requests BOTTLES DRAWN AEROBIC AND ANAEROBIC 10CC  Final   Culture NO GROWTH 5 DAYS  Final   Report Status 12/19/2014 FINAL  Final    Radiology Reports Dg Chest 2 View  12/09/2014   CLINICAL DATA:  Shortness of breath.  Abdominal distention.  EXAM: CHEST  2 VIEW  COMPARISON:  11/29/2014  FINDINGS: The heart size and mediastinal contours are within normal limits. Both lungs are clear. The visualized skeletal structures are unremarkable.  IMPRESSION: No active cardiopulmonary disease.   Electronically Signed   By: Lorriane Shire M.D.   On: 12/09/2014 11:33   Dg Chest 2 View  11/29/2014   CLINICAL DATA:  Lower extremity edema, abdominal distention x1 week  EXAM: CHEST - 2 VIEW  COMPARISON:  11/10/2014  FINDINGS: Improved aeration. Lungs are clear. Heart size normal. No pneumothorax. No effusion. Visualized skeletal structures are unremarkable.  IMPRESSION: No acute cardiopulmonary disease.   Electronically Signed   By: Lucrezia Europe M.D.   On: 11/29/2014 12:50   Ct Head Wo Contrast  12/14/2014  CLINICAL DATA:  Patient found unresponsive today. Initial encounter.  EXAM: CT HEAD WITHOUT CONTRAST  CT CERVICAL SPINE WITHOUT CONTRAST  TECHNIQUE: Multidetector CT imaging of the head and cervical spine was performed following the standard protocol without intravenous contrast. Multiplanar CT image reconstructions of the  cervical spine were also generated.  COMPARISON:  Head CT scan 11/10/2014. Head and cervical spine CT scans 08/29/2014.  FINDINGS: CT HEAD FINDINGS  There is advanced for age appearing cortical atrophy. There is also some chronic microvascular ischemic change. No evidence of acute abnormality including hemorrhage, infarct, mass lesion, mass effect, midline shift or abnormal extra-axial fluid collection is identified. The calvarium is intact. Imaged paranasal sinuses and mastoid air cells are clear.  CT CERVICAL SPINE FINDINGS  There is no fracture or malalignment of the cervical spine. Endotracheal tube and OG tube are in place. Intervertebral disc space height is maintained. Lung apices are clear.  IMPRESSION: No acute abnormality head or cervical spine.  Atrophy and chronic microvascular ischemic change.   Electronically Signed   By: Inge Rise M.D.   On: 12/14/2014 10:02   Ct Cervical Spine Wo Contrast  12/14/2014   CLINICAL DATA:  Patient found unresponsive today. Initial encounter.  EXAM: CT HEAD WITHOUT CONTRAST  CT CERVICAL SPINE WITHOUT CONTRAST  TECHNIQUE: Multidetector CT imaging of the head and cervical spine was performed following the standard protocol without intravenous contrast. Multiplanar CT image reconstructions of the cervical spine were also generated.  COMPARISON:  Head CT scan 11/10/2014. Head and cervical spine CT scans 08/29/2014.  FINDINGS: CT HEAD FINDINGS  There is advanced for age appearing cortical atrophy. There is also some chronic microvascular ischemic change. No evidence of acute abnormality including hemorrhage, infarct, mass lesion, mass effect, midline shift or abnormal extra-axial fluid collection is identified. The calvarium is intact. Imaged paranasal sinuses and mastoid air cells are clear.  CT CERVICAL SPINE FINDINGS  There is no fracture or malalignment of the cervical spine. Endotracheal tube and OG tube are in place. Intervertebral disc space height is  maintained. Lung apices are clear.  IMPRESSION: No acute abnormality head or cervical spine.  Atrophy and chronic microvascular ischemic change.   Electronically Signed   By: Inge Rise M.D.   On: 12/14/2014 10:02   US Abdomen Complete  12/10/2014   CLINICAL DATA:  Cirrhosis.  EXAM: ULTRASOUND ABDOMEN COMPLETE  COMPARISON:  CT 09/17/2014.  FINDINGS: Gallbladder: 4 mm gallbladder polyp. Prominent gallbladder wall thickening at 9.6 mm. This most likely secondary to hyperproteinemia. Similar findings noted on prior CT. Acalculous cholecystitis cannot be excluded . No gallstones. Negative Murphy sign.  Common bile duct: Diameter: 3.9 mm  Liver: Small nodular liver consistent with cirrhosis again noted. Focal ill-defined area of hypo echogenicity noted in the medial aspect of the left lobe of the liver. A focal hepatic mass cannot be completely excluded. Portal vein is patent.  IVC: No abnormality visualized.  Pancreas: Not visualized.  Spleen: Splenomegaly to 15.2 cm.  Right Kidney: Length: 12.0 cm. Echogenicity within normal limits. No mass or hydronephrosis visualized.  Left Kidney: Length: 11.9 cm. Echogenicity within normal limits. No mass or hydronephrosis visualized.  Abdominal aorta: No aneurysm visualized.  Other findings: Moderate ascites.  IMPRESSION: 1. Small nodular liver again noted consistent with cirrhosis. On today's exam there is a focal area of ill-defined hypo echogenicity in the medial aspect of the left lobe liver. A focal hepatic mass cannot be completely excluded. Gadolinium-enhanced MRI the liver can be obtained for  further evaluation as clinically indicated. Associated splenomegaly noted. 2. Moderate ascites. 3. Thickened gallbladder wall again noted. This is most likely from hypoproteinemia. No gallstones noted. Tiny 4 mm gallbladder polyp noted.   Electronically Signed   By: Marcello Moores  Register   On: 12/10/2014 11:00   US Paracentesis  12/19/2014   CLINICAL DATA:  Ascites secondary to  alcoholic cirrhosis  EXAM: ULTRASOUND GUIDED RIGHT LOWER QUADRANT PARACENTESIS  COMPARISON:  None.  PROCEDURE: An ultrasound guided paracentesis was thoroughly discussed with the patient and questions answered. The benefits, risks, alternatives and complications were also discussed. The patient understands and wishes to proceed with the procedure. Written consent was obtained.  Ultrasound was performed to localize and mark an adequate pocket of fluid in the right lower quadrant of the abdomen. The area was then prepped and draped in the normal sterile fashion. 1% Lidocaine was used for local anesthesia. Under ultrasound guidance a 19 gauge Yueh catheter was introduced. Paracentesis was performed. The catheter was removed and a dressing applied.  COMPLICATIONS: None.  FINDINGS: A total of approximately 5 liters of clear yellow fluid was removed. A fluid sample was not sent for laboratory analysis.  IMPRESSION: Successful ultrasound guided paracentesis yielding 5 liters of ascites.  Read by:  Gareth Eagle, PA-C   Electronically Signed   By: Jerilynn Mages.  Shick M.D.   On: 12/19/2014 11:19   US Paracentesis  12/10/2014   INDICATION: ascites  EXAM: ULTRASOUND-GUIDED PARACENTESIS  COMPARISON:  Previous para  MEDICATIONS: 10 cc 1% lidocaine  COMPLICATIONS: None immediate  TECHNIQUE: Informed written consent was obtained from the patient after a discussion of the risks, benefits and alternatives to treatment. A timeout was performed prior to the initiation of the procedure.  Initial ultrasound scanning demonstrates a large amount of ascites within the right lower abdominal quadrant. The right lower abdomen was prepped and draped in the usual sterile fashion. 1% lidocaine with epinephrine was used for local anesthesia. Under direct ultrasound guidance, a 19 gauge, 7-cm, Yueh catheter was introduced. An ultrasound image was saved for documentation purposed. The paracentesis was performed. The catheter was removed and a dressing was  applied. The patient tolerated the procedure well without immediate post procedural complication.  FINDINGS: A total of approximately 5 liters of pale yellow fluid was removed.  IMPRESSION: Successful ultrasound-guided paracentesis yielding 5 liters of peritoneal fluid.  Read by:  Lavonia Drafts [facility]   Electronically Signed   By: Marybelle Killings M.D.   On: 12/10/2014 16:30   US Paracentesis  11/29/2014   CLINICAL DATA:  Abdominal distention, ascites. Request diagnostic and therapeutic paracentesis of up to 5 L max  EXAM: ULTRASOUND GUIDED PARACENTESIS  COMPARISON:  Previous paracentesis  PROCEDURE: An ultrasound guided paracentesis was thoroughly discussed with the patient and questions answered. The benefits, risks, alternatives and complications were also discussed. The patient understands and wishes to proceed with the procedure. Written consent was obtained.  Ultrasound was performed to localize and mark an adequate pocket of fluid in the left lower quadrant of the abdomen. The area was then prepped and draped in the normal sterile fashion. 1% Lidocaine was used for local anesthesia. Under ultrasound guidance a Safe-T-Centesis catheter was introduced. Paracentesis was performed. The catheter was removed and a dressing applied.  COMPLICATIONS: None immediate  FINDINGS: A total of approximately 5 L of . Clear yellow. fluid was removed. A fluid sample was sent for laboratory analysis.  IMPRESSION: Successful ultrasound guided paracentesis yielding 5 L of ascites.  Read  by: Ascencion Dike PA-C   Electronically Signed   By: Marybelle Killings M.D.   On: 11/29/2014 14:07   Dg Chest Port 1 View  12/16/2014   CLINICAL DATA:  Acute respiratory failure  EXAM: PORTABLE CHEST - 1 VIEW  COMPARISON:  Portable chest x-ray of December 14, 2014  FINDINGS: The lungs are better inflated today. The interstitial markings remain mildly increased but have improved. There is no pleural effusion or pneumothorax. The cardiac silhouette is  enlarged. The central pulmonary vascularity is prominent. The endotracheal tube tip lies 4.2 cm above the carina. The esophagogastric tube tip projects below the inferior margin of the image. The bony thorax exhibits no acute abnormality.  IMPRESSION: Interval improvement in the aeration of both lungs with decreased interstitial edema. Mild interstitial edema persists likely secondary to CHF. The support tubes are in reasonable position today.   Electronically Signed   By: David  Martinique M.D.   On: 12/16/2014 08:13   Dg Chest Portable 1 View  12/14/2014   CLINICAL DATA:  Patient unresponsive.  Status post intubation.  EXAM: PORTABLE CHEST - 1 VIEW  COMPARISON:  PA and lateral chest 12/09/2014.  FINDINGS: Endotracheal tube is in place with the tip approximately 0.5 cm above the carina. Recommend withdrawal of 1.5-2 cm. NG tube courses into the stomach and below the inferior margin of film. Lung volumes are low with basilar atelectasis. Heart size is normal.  IMPRESSION: ETT tip projects 0.5 cm above the carina. Recommend withdrawal of 1.5-2 cm.  Basilar atelectasis in a low volume chest.  Critical Value/emergent results were called by telephone at the time of interpretation on 12/14/2014 at 9:31 am to Dr. Ezequiel Essex , who verbally acknowledged these results.   Electronically Signed   By: Inge Rise M.D.   On: 12/14/2014 09:32     CBC  Recent Labs Lab 12/14/14 0909  12/16/14 0521  12/17/14 0815 12/17/14 1451 12/17/14 1936 12/18/14 0133 12/18/14 0738  WBC 5.8  < > 5.2  < > 3.8* 4.1 4.1 4.1 4.3  HGB 7.3*  < > 7.0*  < > 7.8* 9.3* 9.2* 9.3* 9.0*  HCT 21.5*  < > 21.1*  < > 23.6* 27.9* 27.1* 27.6* 26.1*  PLT 75*  < > 55*  < > 45* 42* 43* 51* 49*  MCV 93.5  < > 89.0  < > 89.7 89.4 88.9 88.7 88.8  MCH 31.7  < > 29.5  < > 29.7 29.8 30.2 29.9 30.6  MCHC 34.0  < > 33.2  < > 33.1 33.3 33.9 33.7 34.5  RDW 16.5*  < > 20.8*  < > 19.8* 19.0* 19.1* 19.1* 19.0*  LYMPHSABS 0.5*  --  0.8  --   --   --    --   --   --   MONOABS 0.4  --  0.6  --   --   --   --   --   --   EOSABS 0.0  --  0.4  --   --   --   --   --   --   BASOSABS 0.0  --  0.0  --   --   --   --   --   --   < > = values in this interval not displayed.  Chemistries   Recent Labs Lab 12/14/14 8921  12/15/14 0510 12/15/14 2342 12/16/14 0521 12/17/14 0235 12/18/14 0133 12/19/14 0630 12/20/14 0849  NA 133*  < > 135 136  133* 137 135  --   --   K 5.2*  < > 5.3* 4.6 4.1 3.8 3.3* 3.3* 3.9  CL 103  < > 107 106 104 107 104  --   --   CO2 21*  < > 21* 20* 22 22 24   --   --   GLUCOSE 128*  < > 135* 147* 125* 116* 111*  --   --   BUN 41*  < > 46* 49* 49* 49* 49*  --   --   CREATININE 2.16*  < > 2.41* 2.29* 2.19* 1.72* 1.48*  --   --   CALCIUM 7.9*  < > 7.9* 8.2* 7.9* 8.1* 8.0*  --   --   MG  --   --  2.4  --   --   --   --   --  1.4*  AST 84*  --  80*  --  68* 64* 64*  --   --   ALT 39  --  40  --  34 33 32  --   --   ALKPHOS 112  --  103  --  86 88 94  --   --   BILITOT 2.2*  --  4.7*  --  3.8* 5.7* 4.2*  --   --   < > = values in this interval not displayed. ------------------------------------------------------------------------------------------------------------------ estimated creatinine clearance is 63 mL/min (by C-G formula based on Cr of 1.48). ------------------------------------------------------------------------------------------------------------------ No results for input(s): HGBA1C in the last 72 hours. ------------------------------------------------------------------------------------------------------------------ No results for input(s): CHOL, HDL, LDLCALC, TRIG, CHOLHDL, LDLDIRECT in the last 72 hours. ------------------------------------------------------------------------------------------------------------------ No results for input(s): TSH, T4TOTAL, T3FREE, THYROIDAB in the last 72 hours.  Invalid input(s):  FREET3 ------------------------------------------------------------------------------------------------------------------ No results for input(s): VITAMINB12, FOLATE, FERRITIN, TIBC, IRON, RETICCTPCT in the last 72 hours.  Coagulation profile  Recent Labs Lab 12/14/14 0909 12/15/14 0510 12/16/14 1610 12/17/14 1451 12/18/14 0133  INR 1.67* 1.66* 1.59* 1.53* 1.55*    No results for input(s): DDIMER in the last 72 hours.  Cardiac Enzymes  Recent Labs Lab 12/14/14 1600 12/14/14 2043 12/15/14 0130  TROPONINI <0.03 <0.03 <0.03   ------------------------------------------------------------------------------------------------------------------ Invalid input(s): POCBNP   Time Spent in minutes  35   Ernestine Langworthy K M.D on 12/20/2014 at 10:00 AM  Between 7am to 7pm - Pager - [phone]  After 7pm go to [email or web address] - password [facility]  [facility] -  Office  [phone]

## 2014-12-20 NOTE — Discharge Instructions (Signed)
Follow with Primary MD Barbette Merino, MD in 7 days   Get CBC, CMP, 2 view Chest X ray checked  by Primary MD next visit.    Activity: As tolerated with Full fall precautions use walker/cane & assistance as needed   Disposition SNF   Diet: Heart Healthy Low Carb Check your Weight same time everyday, if you gain over 2 pounds, or you develop in leg swelling, experience more shortness of breath or chest pain, call your Primary MD immediately. Follow Cardiac Low Salt Diet and 1.2 lit/day fluid restriction.  Accuchecks 4 times/day, Once in AM empty stomach and then before each meal. Log in all results and show them to your Prim.MD in 3 days. If any glucose reading is under 80 or above 300 call your Prim MD immidiately. Follow Low glucose instructions for glucose under 80 as instructed.   On your next visit with your primary care physician please Get Medicines reviewed and adjusted.   Please request your Prim.MD to go over all Hospital Tests and Procedure/Radiological results at the follow up, please get all Hospital records sent to your Prim MD by signing hospital release before you go home.   If you experience worsening of your admission symptoms, develop shortness of breath, life threatening emergency, suicidal or homicidal thoughts you must seek medical attention immediately by calling 911 or calling your MD immediately  if symptoms less severe.  You Must read complete instructions/literature along with all the possible adverse reactions/side effects for all the Medicines you take and that have been prescribed to you. Take any new Medicines after you have completely understood and accpet all the possible adverse reactions/side effects.   Do not drive, operating heavy machinery, perform activities at heights, swimming or participation in water activities or provide baby sitting services if your were admitted for syncope or siezures until you have seen by Primary MD or a Neurologist and  advised to do so again.  Do not drive when taking Pain medications.    Do not take more than prescribed Pain, Sleep and Anxiety Medications  Special Instructions: If you have smoked or chewed Tobacco  in the last 2 yrs please stop smoking, stop any regular Alcohol  and or any Recreational drug use.  Wear Seat belts while driving.   Please note  You were cared for by a hospitalist during your hospital stay. If you have any questions about your discharge medications or the care you received while you were in the hospital after you are discharged, you can call the unit and asked to speak with the hospitalist on call if the hospitalist that took care of you is not available. Once you are discharged, your primary care physician will handle any further medical issues. Please note that NO REFILLS for any discharge medications will be authorized once you are discharged, as it is imperative that you return to your primary care physician (or establish a relationship with a primary care physician if you do not have one) for your aftercare needs so that they can reassess your need for medications and monitor your lab values.

## 2014-12-20 NOTE — Progress Notes (Signed)
Pt prepared for d/c to SNF. IV d/c'd. Skin intact except as most recently charted. Vitals are stable. Report called to receiving facility. Pt to be transported by ambulance service. 

## 2014-12-20 NOTE — Clinical Social Work Placement (Signed)
   CLINICAL SOCIAL WORK PLACEMENT  NOTE  Date:  12/20/2014  Patient Details  Name: [name] MRN: [id number] Date of Birth: [birth year]  Clinical Social Work is seeking post-discharge placement for this patient at the [place] level of care (*CSW will initial, date and re-position this form in  chart as items are completed):  Yes   Patient/family provided with [place] Work Department's list of facilities offering this level of care within the geographic area requested by the patient (or if unable, by the patient's family).  Yes   Patient/family informed of their freedom to choose among providers that offer the needed level of care, that participate in Medicare, Medicaid or managed care program needed by the patient, have an available bed and are willing to accept the patient.  Yes   Patient/family informed of [place]'s ownership interest in [facility] and [facility], as well as of the fact that they are under no obligation to receive care at these facilities.  PASRR submitted to EDS on       PASRR number received on       Existing PASRR number confirmed on 12/19/14     FL2 transmitted to all facilities in geographic area requested by pt/family on 12/19/14     FL2 transmitted to all facilities within larger geographic area on 12/20/14     Patient informed that his/her managed care company has contracts with or will negotiate with certain facilities, including the following:        Yes   Patient/family informed of bed offers received.  Patient chooses bed at [facility] and [facility] recommends and patient chooses bed at      Patient to be transferred to [facility] on 12/20/14.  Patient to be transferred to facility by [facility]     Patient family notified on 12/20/14 of transfer.  Name of family member notified:  Constance Holster (significant other)     PHYSICIAN Please sign FL2     Additional Comment:     Ad[place] Laundry Weekend CSW [phone]

## 2014-12-21 LAB — CULTURE, BODY FLUID-BOTTLE

## 2014-12-21 LAB — CULTURE, BODY FLUID W GRAM STAIN -BOTTLE

## 2014-12-23 ENCOUNTER — Other Ambulatory Visit: Payer: Self-pay

## 2015-01-14 ENCOUNTER — Encounter (HOSPITAL_COMMUNITY): Payer: Self-pay

## 2015-01-14 ENCOUNTER — Emergency Department (HOSPITAL_COMMUNITY): Payer: Medicare PPO

## 2015-01-14 ENCOUNTER — Emergency Department (HOSPITAL_COMMUNITY)
Admission: EM | Admit: 2015-01-14 | Discharge: 2015-01-15 | Disposition: A | Discharge status: Home / Self Care | Payer: Medicare PPO | Source: Home / Self Care | Attending: Emergency Medicine | Admitting: Emergency Medicine

## 2015-01-14 DIAGNOSIS — E119 Type 2 diabetes mellitus without complications: Secondary | ICD-10-CM | POA: Insufficient documentation

## 2015-01-14 DIAGNOSIS — R0789 Other chest pain: Secondary | ICD-10-CM | POA: Insufficient documentation

## 2015-01-14 DIAGNOSIS — D649 Anemia, unspecified: Secondary | ICD-10-CM | POA: Insufficient documentation

## 2015-01-14 DIAGNOSIS — J449 Chronic obstructive pulmonary disease, unspecified: Secondary | ICD-10-CM | POA: Insufficient documentation

## 2015-01-14 DIAGNOSIS — M1990 Unspecified osteoarthritis, unspecified site: Secondary | ICD-10-CM | POA: Insufficient documentation

## 2015-01-14 DIAGNOSIS — I10 Essential (primary) hypertension: Secondary | ICD-10-CM | POA: Insufficient documentation

## 2015-01-14 DIAGNOSIS — R600 Localized edema: Secondary | ICD-10-CM | POA: Insufficient documentation

## 2015-01-14 DIAGNOSIS — Z720 Tobacco use: Secondary | ICD-10-CM | POA: Insufficient documentation

## 2015-01-14 DIAGNOSIS — K219 Gastro-esophageal reflux disease without esophagitis: Secondary | ICD-10-CM | POA: Insufficient documentation

## 2015-01-14 DIAGNOSIS — Z8673 Personal history of transient ischemic attack (TIA), and cerebral infarction without residual deficits: Secondary | ICD-10-CM | POA: Insufficient documentation

## 2015-01-14 DIAGNOSIS — Z79899 Other long term (current) drug therapy: Secondary | ICD-10-CM | POA: Insufficient documentation

## 2015-01-14 DIAGNOSIS — R0602 Shortness of breath: Secondary | ICD-10-CM | POA: Insufficient documentation

## 2015-01-14 DIAGNOSIS — K7290 Hepatic failure, unspecified without coma: Secondary | ICD-10-CM | POA: Diagnosis not present

## 2015-01-14 DIAGNOSIS — K7200 Acute and subacute hepatic failure without coma: Secondary | ICD-10-CM | POA: Diagnosis not present

## 2015-01-14 DIAGNOSIS — K7031 Alcoholic cirrhosis of liver with ascites: Secondary | ICD-10-CM

## 2015-01-14 DIAGNOSIS — R609 Edema, unspecified: Secondary | ICD-10-CM

## 2015-01-14 DIAGNOSIS — G8929 Other chronic pain: Secondary | ICD-10-CM | POA: Insufficient documentation

## 2015-01-14 LAB — BASIC METABOLIC PANEL
Anion gap: 9 (ref 5–15)
BUN: 57 mg/dL — AB (ref 6–20)
CALCIUM: 8.3 mg/dL — AB (ref 8.9–10.3)
CO2: 18 mmol/L — ABNORMAL LOW (ref 22–32)
CREATININE: 2.11 mg/dL — AB (ref 0.61–1.24)
Chloride: 103 mmol/L (ref 101–111)
GFR calc Af Amer: 38 mL/min — ABNORMAL LOW (ref >60)
GFR, EST NON AFRICAN AMERICAN: 33 mL/min — AB (ref >60)
Glucose, Bld: 127 mg/dL — ABNORMAL HIGH (ref 65–99)
POTASSIUM: 4.9 mmol/L (ref 3.5–5.1)
SODIUM: 130 mmol/L — AB (ref 135–145)

## 2015-01-14 LAB — I-STAT TROPONIN, ED: TROPONIN I, POC: 0.02 ng/mL (ref 0.00–0.08)

## 2015-01-14 NOTE — ED Provider Notes (Signed)
CSN: [id number]     Arrival date & time 01/14/15  2235 History  This chart was scribed for Darrell Rice, MD by Meriel Pica, ED Scribe. This patient was seen in room A09C/A09C and the patient's care was started 11:42 PM.   Chief Complaint  Patient presents with  . Leg Swelling  . Shortness of Breath   The history is provided by the patient. No language interpreter was used.   HPI Comments: [name] is a 59 y.o. male, with a PMhx of DM, HTN, CVA, and COPD, and cirrhosis who presents to the Emergency Department complaining of constant left-sided chest pain that began gradually 3 days ago. Pain is worse with movement and deep breathing. Denies any fall or injury. Denies shortness of breath or fever. He additionally complains of gradually worsening BLE and abdominal edema onset 3 weeks ago. The pt has been on Lasix in the past but says he is currently not taking this medication.  He notes an intermittent cough at baseline but no change in his cough over the past few days.  Past Medical History  Diagnosis Date  . Neuropathy   . Diabetes mellitus   . Bipolar affect, depressed   . Hypertension   . Arthritis   . Stroke     Mini stroke about 65yrs ago  . Cirrhosis   . Alcohol abuse   . Chronic pain   . Cocaine abuse   . Muscle spasm     both legs  . Encephalopathy, hepatic   . Detached retina   . COPD (chronic obstructive pulmonary disease)     emphysema  . Bronchitis   . Barrett's esophagus   . GERD (gastroesophageal reflux disease)     has ulcer  . Anemia    Past Surgical History  Procedure Laterality Date  . Fracture surgery      Leg and arm 40yrs ago  . Esophagogastroduodenoscopy  04/04/2012    Procedure: ESOPHAGOGASTRODUODENOSCOPY (EGD);  Surgeon: Irene Shipper, MD;  Location: [facility] ENDOSCOPY;  Service: Endoscopy;  Laterality: N/A;  . Esophagogastroduodenoscopy Left 03/13/2013    Procedure: ESOPHAGOGASTRODUODENOSCOPY (EGD);  Surgeon: Arta Silence, MD;  Location: [facility]  ENDOSCOPY;  Service: Endoscopy;  Laterality: Left;  Marland Kitchen Eye surgery  8 months ago both eyes    cataracts both eyes, detached eye, gas pocket  . Vasectomy    . Pars plana vitrectomy Left 07/08/2013    Procedure: PARS PLANA VITRECTOMY WITH 25 GAUGE;  Surgeon: Hurman Horn, MD;  Location: [place];  Service: Ophthalmology;  Laterality: Left;  . Intraocular lens removal Left 07/08/2013    Procedure: REMOVAL OF INTRAOCULAR LENS;  Surgeon: Hurman Horn, MD;  Location: [place];  Service: Ophthalmology;  Laterality: Left;  . Placement and suture of secondary intraocular lens Left 07/08/2013    Procedure: PLACEMENT AND SUTURE OF SECONDARY INTRAOCULAR LENS;  Surgeon: Hurman Horn, MD;  Location: [place];  Service: Ophthalmology;  Laterality: Left;  Insertion of Anterior Capsule Intraocular Lens   . Esophagogastroduodenoscopy N/A 01/16/2014    Procedure: ESOPHAGOGASTRODUODENOSCOPY (EGD);  Surgeon: Winfield Cunas., MD;  Location: [facility] ENDOSCOPY;  Service: Endoscopy;  Laterality: N/A;  . Colonoscopy N/A 01/17/2014    Procedure: COLONOSCOPY;  Surgeon: Winfield Cunas., MD;  Location: [facility] ENDOSCOPY;  Service: Endoscopy;  Laterality: N/A;  possible banding  . Esophagogastroduodenoscopy N/A 08/30/2014    Procedure: ESOPHAGOGASTRODUODENOSCOPY (EGD);  Surgeon: Wilford Corner, MD;  Location: [facility] ENDOSCOPY;  Service: Endoscopy;  Laterality: N/A;  bedside  . Esophagogastroduodenoscopy N/A 11/30/2014    Procedure: ESOPHAGOGASTRODUODENOSCOPY (EGD);  Surgeon: Wilford Corner, MD;  Location: [facility] ENDOSCOPY;  Service: Endoscopy;  Laterality: N/A;  . Esophagogastroduodenoscopy N/A 12/15/2014    Procedure: ESOPHAGOGASTRODUODENOSCOPY (EGD);  Surgeon: Teena Irani, MD;  Location: [facility] ENDOSCOPY;  Service: Endoscopy;  Laterality: N/A;   Family History  Problem Relation Age of Onset  . Hypotension Mother    Social History  Substance Use Topics  . Smoking status: Current Every Day Smoker -- 1.00 packs/day for 30 years    Types:  Cigarettes  . Smokeless tobacco: Never Used  . Alcohol Use: 0.0 oz/week     Comment: 12 pk beer daily  06/2013 - no alcohol since 11/2012    Review of Systems  Constitutional: Negative for fever and chills.  Respiratory: Positive for cough and shortness of breath. Negative for wheezing.   Cardiovascular: Positive for chest pain and leg swelling. Negative for palpitations.  Gastrointestinal: Positive for abdominal distention. Negative for nausea, vomiting, abdominal pain, diarrhea and constipation.  Genitourinary: Negative for dysuria, frequency and flank pain.  Musculoskeletal: Negative for back pain, neck pain and neck stiffness.  Skin: Negative for rash and wound.  Neurological: Negative for dizziness, weakness, light-headedness, numbness and headaches.  All other systems reviewed and are negative.  Allergies  Review of patient's allergies indicates no known allergies.  Home Medications   Prior to Admission medications   Medication Sig Start Date End Date Taking? Authorizing Provider  amitriptyline (ELAVIL) 25 MG tablet Take 1 tablet (25 mg total) by mouth at bedtime. 10/05/14   Nita Sells, MD  CALCIUM-VITAMIN D PO Take 100 Units by mouth daily.    Historical Provider, MD  clonazePAM (KLONOPIN) 0.25 MG disintegrating tablet Take 1 tablet (0.25 mg total) by mouth 2 (two) times daily. 12/20/14   Thurnell Lose, MD  diclofenac sodium (VOLTAREN) 1 % GEL Apply 4 g topically as needed (for back of leg pain).    Historical Provider, MD  feeding supplement (BOOST / RESOURCE BREEZE) LIQD Take 1 Container by mouth 3 (three) times daily between meals. 12/20/14   Thurnell Lose, MD  feeding supplement, ENSURE ENLIVE, (ENSURE ENLIVE) LIQD Take 237 mLs by mouth 2 (two) times daily between meals. 11/13/14   Maryann Mikhail, DO  folic acid (FOLVITE) 1 MG tablet Take 1 tablet (1 mg total) by mouth daily. 10/05/14   Nita Sells, MD  furosemide (LASIX) 40 MG tablet Take 1 tablet (40 mg  total) by mouth daily. 01/15/15   Darrell Rice, MD  lactulose (CHRONULAC) 10 GM/15ML solution Take 45 mLs (30 g total) by mouth 3 (three) times daily. 12/03/14   Annita Brod, MD  LANTUS SOLOSTAR 100 UNIT/ML Solostar Pen Inject 20 Units into the skin daily at 10 pm. 12/20/14   Thurnell Lose, MD  magnesium oxide (MAG-OX) 400 (241.3 MG) MG tablet Take 1 tablet (400 mg total) by mouth 2 (two) times daily. 09/03/14   Reyne Dumas, MD  methocarbamol (ROBAXIN) 500 MG tablet Take 2 tablets (1,000 mg total) by mouth every 8 (eight) hours as needed. 01/15/15   Darrell Rice, MD  Multiple Vitamin (MULTIVITAMIN WITH MINERALS) TABS tablet Take 1 tablet by mouth daily. 07/31/14   Geradine Girt, DO  NOVOLOG FLEXPEN 100 UNIT/ML FlexPen Inject 6 Units into the skin 3 (three) times daily with meals. 11/24/14   Historical Provider, MD  Oxycodone HCl 10 MG TABS Take 1 tablet (10 mg total) by mouth 3 (three) times  daily. 01/15/15   Darrell Rice, MD  pantoprazole (PROTONIX) 40 MG tablet Take 1 tablet (40 mg total) by mouth daily. 12/03/14   Annita Brod, MD  propranolol (INDERAL) 20 MG tablet Take 20 mg by mouth 2 (two) times daily. 09/24/14   Historical Provider, MD  spironolactone (ALDACTONE) 50 MG tablet Take 1 tablet (50 mg total) by mouth daily. 12/03/14   Annita Brod, MD  thiamine (VITAMIN B-1) 100 MG tablet Take 100 mg by mouth daily.    Historical Provider, MD  XIFAXAN 550 MG TABS tablet Take 550 mg by mouth 2 (two) times daily.  10/29/14   Historical Provider, MD   BP 151/65 mmHg  Pulse 103  Temp(Src) 98.2 F (36.8 C) (Oral)  Resp 22  Ht 5\' 11"  (1.803 m)  Wt 262 lb (118.842 kg)  BMI 36.56 kg/m2  SpO2 98% Physical Exam  Constitutional: He is oriented to person, place, and time. He appears well-developed and well-nourished. No distress.  Chronically ill-appearing. mild jaundice  HENT:  Head: Normocephalic and atraumatic.  Mouth/Throat: Oropharynx is clear and moist. No oropharyngeal  exudate.  Eyes: EOM are normal. Pupils are equal, round, and reactive to light.  Neck: Normal range of motion. Neck supple.  Cardiovascular: Normal rate and regular rhythm.  Exam reveals no gallop and no friction rub.   No murmur heard. Pulmonary/Chest: Effort normal and breath sounds normal. No respiratory distress. He has no wheezes. He has no rales. He exhibits tenderness (chest pain is completely reproduced with palpation of the anterior inferior chest wall. There is no crepitance or deformity.).  Abdominal: Soft. Bowel sounds are normal. He exhibits distension. He exhibits no mass. There is no tenderness. There is no rebound and no guarding.  Distended abdomen with anasarca. No tenderness with palpation. Soft. Fluid wave noted  Musculoskeletal: Normal range of motion. He exhibits edema. He exhibits no tenderness.  3+ bilateral pitting edema. No tenderness present.  Neurological: He is alert and oriented to person, place, and time.  Moves all extremities without deficit. Sensation is grossly intact.  Skin: Skin is warm and dry. No rash noted. No erythema.  Psychiatric: He has a normal mood and affect. His behavior is normal.  Nursing note and vitals reviewed.   ED Course  Procedures  DIAGNOSTIC STUDIES: Oxygen Saturation is 98% on RA, normal by my interpretation.    COORDINATION OF CARE: 11:44 PM Discussed treatment plan with pt. Pt acknowledges and agrees to plan.   Labs Review Labs Reviewed  BASIC METABOLIC PANEL - Abnormal; Notable for the following:    Sodium 130 (*)    CO2 18 (*)    Glucose, Bld 127 (*)    BUN 57 (*)    Creatinine, Ser 2.11 (*)    Calcium 8.3 (*)    GFR calc non Af Amer 33 (*)    GFR calc Af Amer 38 (*)    All other components within normal limits  CBC - Abnormal; Notable for the following:    RBC 2.79 (*)    Hemoglobin 8.5 (*)    HCT 25.6 (*)    RDW 20.7 (*)    Platelets 48 (*)    All other components within normal limits  HEPATIC FUNCTION PANEL  - Abnormal; Notable for the following:    Total Protein 6.0 (*)    Albumin 2.3 (*)    AST 85 (*)    Alkaline Phosphatase 141 (*)    Total Bilirubin 2.7 (*)  Bilirubin, Direct 1.0 (*)    Indirect Bilirubin 1.7 (*)    All other components within normal limits  BRAIN NATRIURETIC PEPTIDE  I-STAT TROPOININ, ED    Imaging Review Dg Chest 2 View  01/14/2015   CLINICAL DATA:  Initial evaluation for acute left-sided chest pain.  EXAM: CHEST  2 VIEW  COMPARISON:  Prior radiograph from 01/01/2015.  FINDINGS: Cardiomegaly is stable. Mediastinal silhouette within normal limits.  The lungs are normally inflated. No airspace consolidation, pleural effusion, or pulmonary edema is identified. There is no pneumothorax.  No acute osseous abnormality identified.  IMPRESSION: 1. No active cardiopulmonary disease. 2. Stable cardiomegaly.   Electronically Signed   By: Jeannine Boga M.D.   On: 01/14/2015 23:45   I have personally reviewed and evaluated these images and lab results as part of my medical decision-making.   EKG Interpretation   Date/Time:    Ventricular Rate:  98 PR Interval:  180 QRS Duration: 78 QT Interval:  358 QTC Calculation: 457 R Axis:     Text Interpretation:        MDM   Final diagnoses:  Peripheral edema  Anterior chest wall pain  Alcoholic cirrhosis of liver with ascites   I personally performed the services described in this documentation, which was scribed in my presence. The recorded information has been reviewed and is accurate.  Patient with history of cirrhosis appears chronically ill. Presents with new onset left sided chest pain. Completely reproduced with palpation. No evidence of ischemia on EKG. Normal troponin. Patient does have baseline renal insufficiency is mildly worse. He has had in all areas of his LFTs which are his baseline. Will start on low-dose of Lasix and treat the chest wall pain dramatically. He's been advised to call and make  appointment to follow-up with his primary physician. He's been given return precautions and is voiced understanding.   Darrell Rice, MD 01/15/15 [phone]

## 2015-01-14 NOTE — ED Notes (Addendum)
Pt here for swelling to bilateral legs and feet for the past 2-3 days. C/o pain to the left rib area. Denies any fall or injury. C/o SOB with the rib pain.

## 2015-01-15 LAB — CBC
HCT: 25.6 % — ABNORMAL LOW (ref 39.0–52.0)
Hemoglobin: 8.5 g/dL — ABNORMAL LOW (ref 13.0–17.0)
MCH: 30.5 pg (ref 26.0–34.0)
MCHC: 33.2 g/dL (ref 30.0–36.0)
MCV: 91.8 fL (ref 78.0–100.0)
PLATELETS: 48 10*3/uL — AB (ref 150–400)
RBC: 2.79 MIL/uL — ABNORMAL LOW (ref 4.22–5.81)
RDW: 20.7 % — ABNORMAL HIGH (ref 11.5–15.5)
WBC: 5.8 10*3/uL (ref 4.0–10.5)

## 2015-01-15 LAB — HEPATIC FUNCTION PANEL
ALT: 42 U/L (ref 17–63)
AST: 85 U/L — ABNORMAL HIGH (ref 15–41)
Albumin: 2.3 g/dL — ABNORMAL LOW (ref 3.5–5.0)
Alkaline Phosphatase: 141 U/L — ABNORMAL HIGH (ref 38–126)
BILIRUBIN DIRECT: 1 mg/dL — AB (ref 0.1–0.5)
BILIRUBIN INDIRECT: 1.7 mg/dL — AB (ref 0.3–0.9)
Total Bilirubin: 2.7 mg/dL — ABNORMAL HIGH (ref 0.3–1.2)
Total Protein: 6 g/dL — ABNORMAL LOW (ref 6.5–8.1)

## 2015-01-15 LAB — BRAIN NATRIURETIC PEPTIDE: B Natriuretic Peptide: 46.4 pg/mL (ref 0.0–100.0)

## 2015-01-15 MED ORDER — METHOCARBAMOL 500 MG PO TABS: 1000.0000 mg | ORAL_TABLET | Freq: Three times a day (TID) | ORAL | Status: DC | PRN

## 2015-01-15 MED ORDER — FUROSEMIDE 40 MG PO TABS
40.0000 mg | ORAL_TABLET | Freq: Every day | ORAL | Status: DC
Start: 2015-01-15 — End: 2015-01-25

## 2015-01-15 MED ORDER — OXYCODONE HCL 10 MG PO TABS: 10.0000 mg | ORAL_TABLET | Freq: Three times a day (TID) | ORAL | Status: DC

## 2015-01-15 NOTE — Discharge Instructions (Signed)
Ascites °Ascites is a gathering of fluid in the belly (abdomen). This is most often caused by liver disease. It may also be caused by a number of other less common problems. It causes a ballooning out (distension) of the abdomen. °CAUSES  °Scarring of the liver (cirrhosis) is the most common cause of ascites. Other causes include: °· Infection or inflammation in the abdomen. °· Cancer in the abdomen. °· Heart failure. °· Certain forms of kidney failure (nephritic syndrome). °· Inflammation of the pancreas. °· Clots in the veins of the liver. °SYMPTOMS  °In the early stages of ascites, you may not have any symptoms. The main symptom of ascites is a sense of abdominal bloating. This is due to the presence of fluid. This may also cause an increase in abdominal or waist size. People with this condition can develop swelling in the legs, and men can develop a swollen scrotum. When there is a lot of fluid, it may be hard to breath. Stretching of the abdomen by fluid can be painful. °DIAGNOSIS  °Certain features of your medical history, such as a history of liver disease and of an enlarging abdomen, can suggest the presence of ascites. The diagnosis of ascites can be made on physical exam by your caregiver. An abdominal ultrasound examination can confirm that ascites is present, and estimate the amount of fluid. °Once ascites is confirmed, it is important to determine its cause. Again, a history of one of the conditions listed in "CAUSES" provides a strong clue. A physical exam is important, and blood and X-ray tests may be needed. During a procedure called paracentesis, a sample of fluid is removed from the abdomen. This can determine certain key features about the fluid, such as whether or not infection or cancer is present. Your caregiver will determine if a paracentesis is necessary. They will describe the procedure to you. °PREVENTION  °Ascites is a complication of other conditions. Therefore to prevent ascites, you  must seek treatment for any significant health conditions you have. Once ascites is present, careful attention to fluid and salt intake may help prevent it from getting worse. If you have ascites, you should not drink alcohol. °PROGNOSIS  °The prognosis of ascites depends on the underlying disease. If the disease is reversible, such as with certain infections or with heart failure, then ascites may improve or disappear. When ascites is caused by cirrhosis, then it indicates that the liver disease has worsened, and further evaluation and treatment of the liver disease is needed. If your ascites is caused by cancer, then the success or failure of the cancer treatment will determine whether your ascites will improve or worsen. °RISKS AND COMPLICATIONS  °Ascites is likely to worsen if it is not properly diagnosed and treated. A large amount of ascites can cause pain and difficulty breathing. The main complication, besides worsening, is infection (called spontaneous bacterial peritonitis). This requires prompt treatment. °TREATMENT  °The treatment of ascites depends on its cause. When liver disease is your cause, medical management using water pills (diuretics) and decreasing salt intake is often effective. Ascites due to peritoneal inflammation or malignancy (cancer) alone does not respond to salt restriction and diuretics. Hospitalization is sometimes required. °If the treatment of ascites cannot be managed with medications, a number of other treatments are available. Your caregivers will help you decide which will work best for you. Some of these are: °· Removal of fluid from the abdomen (paracentesis). °· Fluid from the abdomen is passed into a vein (peritoneovenous shunting). °·   Liver transplantation.  Transjugular intrahepatic portosystemic stent shunt. HOME CARE INSTRUCTIONS  It is important to monitor body weight and the intake and output of fluids. Weigh yourself at the same time every day. Record your  weights. Fluid restriction may be necessary. It is also important to know your salt intake. The more salt you take in, the more fluid you will retain. Ninety percent of people with ascites respond to this approach.  Follow any directions for medicines carefully.  Follow up with your caregiver, as directed.  Report any changes in your health, especially any new or worsening symptoms.  If your ascites is from liver disease, avoid alcohol and other substances toxic to the liver. SEEK MEDICAL CARE IF:   Your weight increases more than a few pounds in a few days.  Your abdominal or waist size increases.  You develop swelling in your legs.  You had swelling and it worsens. SEEK IMMEDIATE MEDICAL CARE IF:   You develop a fever.  You develop new abdominal pain.  You develop difficulty breathing.  You develop confusion.  You have bleeding from the mouth, stomach, or rectum. MAKE SURE YOU:   Understand these instructions.  Will watch your condition.  Will get help right away if you are not doing well or get worse. Document Released: 05/09/2005 Document Revised: 08/01/2011 Document Reviewed: 12/08/2006 [facility] Patient Information 2015 [place], [place]. This information is not intended to replace advice given to you by your health care provider. Make sure you discuss any questions you have with your health care provider.  Chest Wall Pain Chest wall pain is pain in or around the bones and muscles of your chest. It may take up to 6 weeks to get better. It may take longer if you must stay physically active in your work and activities.  CAUSES  Chest wall pain may happen on its own. However, it may be caused by:  A viral illness like the flu.  Injury.  Coughing.  Exercise.  Arthritis.  Fibromyalgia.  Shingles. HOME CARE INSTRUCTIONS   Avoid overtiring physical activity. Try not to strain or perform activities that cause pain. This includes any activities using your chest or  your abdominal and side muscles, especially if heavy weights are used.  Put ice on the sore area.  Put ice in a plastic bag.  Place a towel between your skin and the bag.  Leave the ice on for 15-20 minutes per hour while awake for the first 2 days.  Only take over-the-counter or prescription medicines for pain, discomfort, or fever as directed by your caregiver. SEEK IMMEDIATE MEDICAL CARE IF:   Your pain increases, or you are very uncomfortable.  You have a fever.  Your chest pain becomes worse.  You have new, unexplained symptoms.  You have nausea or vomiting.  You feel sweaty or lightheaded.  You have a cough with phlegm (sputum), or you cough up blood. MAKE SURE YOU:   Understand these instructions.  Will watch your condition.  Will get help right away if you are not doing well or get worse. Document Released: 05/09/2005 Document Revised: 08/01/2011 Document Reviewed: 01/03/2011 [facility] Patient Information 2015 [place], [place]. This information is not intended to replace advice given to you by your health care provider. Make sure you discuss any questions you have with your health care provider.  Peripheral Edema You have swelling in your legs (peripheral edema). This swelling is due to excess accumulation of salt and water in your body. Edema may be  a sign of heart, kidney or liver disease, or a side effect of a medication. It may also be due to problems in the leg veins. Elevating your legs and using special support stockings may be very helpful, if the cause of the swelling is due to poor venous circulation. Avoid long periods of standing, whatever the cause. Treatment of edema depends on identifying the cause. Chips, pretzels, pickles and other salty foods should be avoided. Restricting salt in your diet is almost always needed. Water pills (diuretics) are often used to remove the excess salt and water from your body via urine. These medicines prevent the kidney from  reabsorbing sodium. This increases urine flow. Diuretic treatment may also result in lowering of potassium levels in your body. Potassium supplements may be needed if you have to use diuretics daily. Daily weights can help you keep track of your progress in clearing your edema. You should call your caregiver for follow up care as recommended. SEEK IMMEDIATE MEDICAL CARE IF:   You have increased swelling, pain, redness, or heat in your legs.  You develop shortness of breath, especially when lying down.  You develop chest or abdominal pain, weakness, or fainting.  You have a fever. Document Released: 06/16/2004 Document Revised: 08/01/2011 Document Reviewed: 05/27/2009 [facility] Patient Information 2015 [place], [place]. This information is not intended to replace advice given to you by your health care provider. Make sure you discuss any questions you have with your health care provider.

## 2015-01-17 ENCOUNTER — Emergency Department (HOSPITAL_COMMUNITY): Payer: Medicare PPO

## 2015-01-17 ENCOUNTER — Inpatient Hospital Stay (HOSPITAL_COMMUNITY): Payer: Medicare PPO

## 2015-01-17 ENCOUNTER — Inpatient Hospital Stay (HOSPITAL_COMMUNITY)
Admission: EM | Admit: 2015-01-17 | Discharge: 2015-01-23 | DRG: 420 | Disposition: A | Discharge status: Home Health | Payer: Medicare PPO | Attending: Internal Medicine | Admitting: Internal Medicine

## 2015-01-17 ENCOUNTER — Encounter (HOSPITAL_COMMUNITY): Payer: Self-pay

## 2015-01-17 DIAGNOSIS — Z794 Long term (current) use of insulin: Secondary | ICD-10-CM | POA: Diagnosis not present

## 2015-01-17 DIAGNOSIS — E872 Acidosis, unspecified: Secondary | ICD-10-CM

## 2015-01-17 DIAGNOSIS — L03818 Cellulitis of other sites: Secondary | ICD-10-CM | POA: Diagnosis not present

## 2015-01-17 DIAGNOSIS — A419 Sepsis, unspecified organism: Secondary | ICD-10-CM | POA: Diagnosis not present

## 2015-01-17 DIAGNOSIS — W19XXXA Unspecified fall, initial encounter: Secondary | ICD-10-CM | POA: Diagnosis present

## 2015-01-17 DIAGNOSIS — J449 Chronic obstructive pulmonary disease, unspecified: Secondary | ICD-10-CM | POA: Diagnosis present

## 2015-01-17 DIAGNOSIS — I878 Other specified disorders of veins: Secondary | ICD-10-CM

## 2015-01-17 DIAGNOSIS — F17210 Nicotine dependence, cigarettes, uncomplicated: Secondary | ICD-10-CM | POA: Diagnosis present

## 2015-01-17 DIAGNOSIS — Z9114 Patient's other noncompliance with medication regimen: Secondary | ICD-10-CM | POA: Diagnosis present

## 2015-01-17 DIAGNOSIS — Z8673 Personal history of transient ischemic attack (TIA), and cerebral infarction without residual deficits: Secondary | ICD-10-CM | POA: Diagnosis not present

## 2015-01-17 DIAGNOSIS — Z6837 Body mass index (BMI) 37.0-37.9, adult: Secondary | ICD-10-CM

## 2015-01-17 DIAGNOSIS — Z79899 Other long term (current) drug therapy: Secondary | ICD-10-CM | POA: Diagnosis not present

## 2015-01-17 DIAGNOSIS — K7469 Other cirrhosis of liver: Secondary | ICD-10-CM

## 2015-01-17 DIAGNOSIS — Z515 Encounter for palliative care: Secondary | ICD-10-CM | POA: Diagnosis not present

## 2015-01-17 DIAGNOSIS — F1410 Cocaine abuse, uncomplicated: Secondary | ICD-10-CM | POA: Diagnosis present

## 2015-01-17 DIAGNOSIS — N183 Chronic kidney disease, stage 3 (moderate): Secondary | ICD-10-CM | POA: Diagnosis present

## 2015-01-17 DIAGNOSIS — R627 Adult failure to thrive: Secondary | ICD-10-CM | POA: Diagnosis present

## 2015-01-17 DIAGNOSIS — E871 Hypo-osmolality and hyponatremia: Secondary | ICD-10-CM | POA: Diagnosis present

## 2015-01-17 DIAGNOSIS — M1990 Unspecified osteoarthritis, unspecified site: Secondary | ICD-10-CM | POA: Diagnosis present

## 2015-01-17 DIAGNOSIS — K7200 Acute and subacute hepatic failure without coma: Secondary | ICD-10-CM | POA: Diagnosis present

## 2015-01-17 DIAGNOSIS — L03119 Cellulitis of unspecified part of limb: Secondary | ICD-10-CM | POA: Diagnosis not present

## 2015-01-17 DIAGNOSIS — R531 Weakness: Secondary | ICD-10-CM | POA: Diagnosis not present

## 2015-01-17 DIAGNOSIS — K767 Hepatorenal syndrome: Secondary | ICD-10-CM | POA: Diagnosis present

## 2015-01-17 DIAGNOSIS — E1140 Type 2 diabetes mellitus with diabetic neuropathy, unspecified: Secondary | ICD-10-CM | POA: Diagnosis present

## 2015-01-17 DIAGNOSIS — G8929 Other chronic pain: Secondary | ICD-10-CM | POA: Diagnosis present

## 2015-01-17 DIAGNOSIS — L0390 Cellulitis, unspecified: Secondary | ICD-10-CM | POA: Diagnosis present

## 2015-01-17 DIAGNOSIS — E875 Hyperkalemia: Secondary | ICD-10-CM | POA: Diagnosis present

## 2015-01-17 DIAGNOSIS — F319 Bipolar disorder, unspecified: Secondary | ICD-10-CM | POA: Diagnosis present

## 2015-01-17 DIAGNOSIS — R188 Other ascites: Secondary | ICD-10-CM

## 2015-01-17 DIAGNOSIS — L03116 Cellulitis of left lower limb: Secondary | ICD-10-CM | POA: Diagnosis present

## 2015-01-17 DIAGNOSIS — R601 Generalized edema: Secondary | ICD-10-CM | POA: Diagnosis not present

## 2015-01-17 DIAGNOSIS — D696 Thrombocytopenia, unspecified: Secondary | ICD-10-CM

## 2015-01-17 DIAGNOSIS — K7290 Hepatic failure, unspecified without coma: Secondary | ICD-10-CM | POA: Diagnosis present

## 2015-01-17 DIAGNOSIS — R29898 Other symptoms and signs involving the musculoskeletal system: Secondary | ICD-10-CM | POA: Insufficient documentation

## 2015-01-17 DIAGNOSIS — N179 Acute kidney failure, unspecified: Secondary | ICD-10-CM | POA: Diagnosis not present

## 2015-01-17 DIAGNOSIS — K766 Portal hypertension: Secondary | ICD-10-CM | POA: Diagnosis present

## 2015-01-17 DIAGNOSIS — T68XXXA Hypothermia, initial encounter: Secondary | ICD-10-CM | POA: Diagnosis not present

## 2015-01-17 DIAGNOSIS — Z79891 Long term (current) use of opiate analgesic: Secondary | ICD-10-CM | POA: Diagnosis not present

## 2015-01-17 DIAGNOSIS — E1122 Type 2 diabetes mellitus with diabetic chronic kidney disease: Secondary | ICD-10-CM | POA: Diagnosis present

## 2015-01-17 DIAGNOSIS — I10 Essential (primary) hypertension: Secondary | ICD-10-CM | POA: Diagnosis present

## 2015-01-17 DIAGNOSIS — L03115 Cellulitis of right lower limb: Secondary | ICD-10-CM | POA: Diagnosis present

## 2015-01-17 DIAGNOSIS — I5033 Acute on chronic diastolic (congestive) heart failure: Secondary | ICD-10-CM | POA: Diagnosis present

## 2015-01-17 DIAGNOSIS — D61818 Other pancytopenia: Secondary | ICD-10-CM | POA: Diagnosis present

## 2015-01-17 DIAGNOSIS — R680 Hypothermia, not associated with low environmental temperature: Secondary | ICD-10-CM | POA: Diagnosis present

## 2015-01-17 DIAGNOSIS — K219 Gastro-esophageal reflux disease without esophagitis: Secondary | ICD-10-CM | POA: Diagnosis present

## 2015-01-17 DIAGNOSIS — E440 Moderate protein-calorie malnutrition: Secondary | ICD-10-CM | POA: Diagnosis present

## 2015-01-17 DIAGNOSIS — I129 Hypertensive chronic kidney disease with stage 1 through stage 4 chronic kidney disease, or unspecified chronic kidney disease: Secondary | ICD-10-CM | POA: Diagnosis present

## 2015-01-17 DIAGNOSIS — K7031 Alcoholic cirrhosis of liver with ascites: Secondary | ICD-10-CM | POA: Diagnosis present

## 2015-01-17 DIAGNOSIS — F1010 Alcohol abuse, uncomplicated: Secondary | ICD-10-CM | POA: Diagnosis present

## 2015-01-17 DIAGNOSIS — K7682 Hepatic encephalopathy: Secondary | ICD-10-CM | POA: Diagnosis present

## 2015-01-17 DIAGNOSIS — R079 Chest pain, unspecified: Secondary | ICD-10-CM

## 2015-01-17 LAB — ETHANOL: Alcohol, Ethyl (B): <5 mg/dL (ref <5)

## 2015-01-17 LAB — CBC
HEMATOCRIT: 27.8 % — AB (ref 39.0–52.0)
Hemoglobin: 9.3 g/dL — ABNORMAL LOW (ref 13.0–17.0)
MCH: 30.7 pg (ref 26.0–34.0)
MCHC: 33.5 g/dL (ref 30.0–36.0)
MCV: 91.7 fL (ref 78.0–100.0)
PLATELETS: 60 10*3/uL — AB (ref 150–400)
RBC: 3.03 MIL/uL — AB (ref 4.22–5.81)
RDW: 21.4 % — ABNORMAL HIGH (ref 11.5–15.5)
WBC: 5.3 10*3/uL (ref 4.0–10.5)

## 2015-01-17 LAB — TYPE AND SCREEN
ABO/RH(D): O POS
Antibody Screen: NEGATIVE

## 2015-01-17 LAB — COMPREHENSIVE METABOLIC PANEL
ALT: 45 U/L (ref 17–63)
AST: 89 U/L — AB (ref 15–41)
Albumin: 2.5 g/dL — ABNORMAL LOW (ref 3.5–5.0)
Alkaline Phosphatase: 155 U/L — ABNORMAL HIGH (ref 38–126)
Anion gap: 7 (ref 5–15)
BILIRUBIN TOTAL: 3.2 mg/dL — AB (ref 0.3–1.2)
BUN: 56 mg/dL — AB (ref 6–20)
CALCIUM: 8.1 mg/dL — AB (ref 8.9–10.3)
CHLORIDE: 106 mmol/L (ref 101–111)
CO2: 19 mmol/L — ABNORMAL LOW (ref 22–32)
CREATININE: 1.92 mg/dL — AB (ref 0.61–1.24)
GFR, EST AFRICAN AMERICAN: 42 mL/min — AB (ref >60)
GFR, EST NON AFRICAN AMERICAN: 37 mL/min — AB (ref >60)
Glucose, Bld: 131 mg/dL — ABNORMAL HIGH (ref 65–99)
Potassium: 5.2 mmol/L — ABNORMAL HIGH (ref 3.5–5.1)
Sodium: 132 mmol/L — ABNORMAL LOW (ref 135–145)
TOTAL PROTEIN: 6.1 g/dL — AB (ref 6.5–8.1)

## 2015-01-17 LAB — I-STAT CG4 LACTIC ACID, ED: LACTIC ACID, VENOUS: 3.38 mmol/L — AB (ref 0.5–2.0)

## 2015-01-17 LAB — BRAIN NATRIURETIC PEPTIDE: B NATRIURETIC PEPTIDE 5: 75.3 pg/mL (ref 0.0–100.0)

## 2015-01-17 LAB — RAPID URINE DRUG SCREEN, HOSP PERFORMED
AMPHETAMINES: NOT DETECTED
BENZODIAZEPINES: POSITIVE — AB
Barbiturates: NOT DETECTED
COCAINE: NOT DETECTED
OPIATES: POSITIVE — AB
Tetrahydrocannabinol: NOT DETECTED

## 2015-01-17 LAB — GLUCOSE, CAPILLARY
GLUCOSE-CAPILLARY: 127 mg/dL — AB (ref 65–99)
GLUCOSE-CAPILLARY: 109 mg/dL — AB (ref 65–99)
GLUCOSE-CAPILLARY: 116 mg/dL — AB (ref 65–99)

## 2015-01-17 LAB — ABO/RH: ABO/RH(D): O POS

## 2015-01-17 LAB — URINE MICROSCOPIC-ADD ON

## 2015-01-17 LAB — URINALYSIS, ROUTINE W REFLEX MICROSCOPIC
Bilirubin Urine: NEGATIVE
GLUCOSE, UA: NEGATIVE mg/dL
KETONES UR: NEGATIVE mg/dL
NITRITE: NEGATIVE
PROTEIN: NEGATIVE mg/dL
Specific Gravity, Urine: 1.014 (ref 1.005–1.030)
UROBILINOGEN UA: 0.2 mg/dL (ref 0.0–1.0)
pH: 5.5 (ref 5.0–8.0)

## 2015-01-17 LAB — PROTIME-INR
INR: 1.54 — AB (ref 0.00–1.49)
PROTHROMBIN TIME: 18.6 s — AB (ref 11.6–15.2)

## 2015-01-17 LAB — MRSA PCR SCREENING: MRSA by PCR: POSITIVE — AB

## 2015-01-17 LAB — LACTIC ACID, PLASMA: LACTIC ACID, VENOUS: 3.4 mmol/L — AB (ref 0.5–2.0)

## 2015-01-17 LAB — PROCALCITONIN: Procalcitonin: 0.2 ng/mL

## 2015-01-17 LAB — CK: CK TOTAL: 170 U/L (ref 49–397)

## 2015-01-17 LAB — AMMONIA: Ammonia: 53 umol/L — ABNORMAL HIGH (ref 9–35)

## 2015-01-17 LAB — MAGNESIUM: Magnesium: 2.1 mg/dL (ref 1.7–2.4)

## 2015-01-17 LAB — TSH: TSH: 0.869 u[IU]/mL (ref 0.350–4.500)

## 2015-01-17 MED ORDER — FOLIC ACID 1 MG PO TABS
1.0000 mg | ORAL_TABLET | Freq: Every day | ORAL | Status: DC
  Administered 2015-01-18 – 2015-01-23 (×6): 1 mg via ORAL
  Filled 2015-01-17 (×6): qty 1

## 2015-01-17 MED ORDER — SODIUM CHLORIDE 0.9 % IV SOLN
2500.0000 mg | Freq: Once | INTRAVENOUS | Status: AC
  Administered 2015-01-17: 2500 mg via INTRAVENOUS
  Filled 2015-01-17: qty 2500

## 2015-01-17 MED ORDER — LACTULOSE 10 GM/15ML PO SOLN
30.0000 g | Freq: Three times a day (TID) | ORAL | Status: DC
  Administered 2015-01-17 – 2015-01-22 (×10): 30 g via ORAL
  Filled 2015-01-17 (×19): qty 45

## 2015-01-17 MED ORDER — THIAMINE HCL 100 MG/ML IJ SOLN: 100.0000 mg | Freq: Every day | INTRAMUSCULAR | Status: DC

## 2015-01-17 MED ORDER — ACETAMINOPHEN 650 MG RE SUPP
650.0000 mg | Freq: Four times a day (QID) | RECTAL | Status: DC | PRN
Start: 2015-01-17 — End: 2015-01-18

## 2015-01-17 MED ORDER — PIPERACILLIN-TAZOBACTAM 3.375 G IVPB
3.3750 g | Freq: Once | INTRAVENOUS | Status: AC
  Administered 2015-01-17: 3.375 g via INTRAVENOUS
  Filled 2015-01-17: qty 50

## 2015-01-17 MED ORDER — SODIUM CHLORIDE 0.9 % IV SOLN
INTRAVENOUS | Status: DC
  Administered 2015-01-17: 13:00:00 via INTRAVENOUS

## 2015-01-17 MED ORDER — FUROSEMIDE 10 MG/ML IJ SOLN
60.0000 mg | Freq: Once | INTRAMUSCULAR | Status: AC
  Administered 2015-01-17: 60 mg via INTRAVENOUS
  Filled 2015-01-17: qty 8

## 2015-01-17 MED ORDER — LORAZEPAM 1 MG PO TABS: 1.0000 mg | ORAL_TABLET | Freq: Four times a day (QID) | ORAL | Status: DC | PRN

## 2015-01-17 MED ORDER — ADULT MULTIVITAMIN W/MINERALS CH
1.0000 | ORAL_TABLET | Freq: Every day | ORAL | Status: DC
  Administered 2015-01-18 – 2015-01-23 (×6): 1 via ORAL
  Filled 2015-01-17 (×6): qty 1

## 2015-01-17 MED ORDER — ONDANSETRON HCL 4 MG/2ML IJ SOLN: 4.0000 mg | Freq: Four times a day (QID) | INTRAMUSCULAR | Status: DC | PRN

## 2015-01-17 MED ORDER — SODIUM CHLORIDE 0.9 % IV SOLN
INTRAVENOUS | Status: DC
  Administered 2015-01-17 – 2015-01-19 (×3): via INTRAVENOUS

## 2015-01-17 MED ORDER — LACTULOSE 10 GM/15ML PO SOLN
20.0000 g | Freq: Once | ORAL | Status: AC
  Administered 2015-01-17: 20 g via ORAL
  Filled 2015-01-17: qty 30

## 2015-01-17 MED ORDER — CHLORHEXIDINE GLUCONATE CLOTH 2 % EX PADS
6.0000 | MEDICATED_PAD | Freq: Every day | CUTANEOUS | Status: AC
  Administered 2015-01-18 – 2015-01-20 (×3): 6 via TOPICAL

## 2015-01-17 MED ORDER — VITAMIN B-1 100 MG PO TABS
100.0000 mg | ORAL_TABLET | Freq: Every day | ORAL | Status: DC
  Administered 2015-01-18 – 2015-01-23 (×6): 100 mg via ORAL
  Filled 2015-01-17 (×6): qty 1

## 2015-01-17 MED ORDER — SODIUM CHLORIDE 0.9 % IJ SOLN
3.0000 mL | Freq: Two times a day (BID) | INTRAMUSCULAR | Status: DC
  Administered 2015-01-18 – 2015-01-23 (×11): 3 mL via INTRAVENOUS

## 2015-01-17 MED ORDER — SODIUM CHLORIDE 0.9 % IV SOLN
1250.0000 mg | INTRAVENOUS | Status: DC
  Administered 2015-01-18 – 2015-01-19 (×2): 1250 mg via INTRAVENOUS
  Filled 2015-01-17 (×2): qty 1250

## 2015-01-17 MED ORDER — PIPERACILLIN-TAZOBACTAM 3.375 G IVPB
3.3750 g | Freq: Three times a day (TID) | INTRAVENOUS | Status: DC
  Administered 2015-01-17 – 2015-01-20 (×8): 3.375 g via INTRAVENOUS
  Filled 2015-01-17 (×7): qty 50

## 2015-01-17 MED ORDER — ALBUTEROL SULFATE (2.5 MG/3ML) 0.083% IN NEBU
5.0000 mg | INHALATION_SOLUTION | Freq: Once | RESPIRATORY_TRACT | Status: AC
  Administered 2015-01-17: 5 mg via RESPIRATORY_TRACT
  Filled 2015-01-17: qty 6

## 2015-01-17 MED ORDER — CETYLPYRIDINIUM CHLORIDE 0.05 % MT LIQD
7.0000 mL | Freq: Two times a day (BID) | OROMUCOSAL | Status: DC
  Administered 2015-01-17 – 2015-01-23 (×12): 7 mL via OROMUCOSAL

## 2015-01-17 MED ORDER — SODIUM CHLORIDE 0.9 % IV SOLN: INTRAVENOUS | Status: DC

## 2015-01-17 MED ORDER — PANTOPRAZOLE SODIUM 40 MG PO TBEC
40.0000 mg | DELAYED_RELEASE_TABLET | Freq: Every day | ORAL | Status: DC
  Administered 2015-01-18 – 2015-01-23 (×6): 40 mg via ORAL
  Filled 2015-01-17 (×6): qty 1

## 2015-01-17 MED ORDER — ACETAMINOPHEN 325 MG PO TABS: 650.0000 mg | ORAL_TABLET | Freq: Four times a day (QID) | ORAL | Status: DC | PRN

## 2015-01-17 MED ORDER — RIFAXIMIN 550 MG PO TABS
550.0000 mg | ORAL_TABLET | Freq: Two times a day (BID) | ORAL | Status: DC
  Administered 2015-01-17 – 2015-01-23 (×12): 550 mg via ORAL
  Filled 2015-01-17 (×14): qty 1

## 2015-01-17 MED ORDER — INSULIN ASPART 100 UNIT/ML ~~LOC~~ SOLN
0.0000 [IU] | Freq: Three times a day (TID) | SUBCUTANEOUS | Status: DC
  Administered 2015-01-18: 2 [IU] via SUBCUTANEOUS
  Administered 2015-01-18: 1 [IU] via SUBCUTANEOUS
  Administered 2015-01-19: 2 [IU] via SUBCUTANEOUS
  Administered 2015-01-19: 5 [IU] via SUBCUTANEOUS
  Administered 2015-01-20: 3 [IU] via SUBCUTANEOUS
  Administered 2015-01-20 – 2015-01-21 (×4): 2 [IU] via SUBCUTANEOUS
  Administered 2015-01-21 – 2015-01-22 (×2): 1 [IU] via SUBCUTANEOUS
  Administered 2015-01-22 – 2015-01-23 (×4): 2 [IU] via SUBCUTANEOUS

## 2015-01-17 MED ORDER — ONDANSETRON HCL 4 MG PO TABS: 4.0000 mg | ORAL_TABLET | Freq: Four times a day (QID) | ORAL | Status: DC | PRN

## 2015-01-17 MED ORDER — IPRATROPIUM BROMIDE 0.02 % IN SOLN
0.5000 mg | Freq: Once | RESPIRATORY_TRACT | Status: AC
  Administered 2015-01-17: 0.5 mg via RESPIRATORY_TRACT
  Filled 2015-01-17: qty 2.5

## 2015-01-17 MED ORDER — MUPIROCIN 2 % EX OINT
TOPICAL_OINTMENT | CUTANEOUS | Status: AC
  Administered 2015-01-17: 1 via NASAL
  Filled 2015-01-17: qty 22

## 2015-01-17 MED ORDER — LORAZEPAM 2 MG/ML IJ SOLN: 1.0000 mg | Freq: Four times a day (QID) | INTRAMUSCULAR | Status: DC | PRN

## 2015-01-17 MED ORDER — MUPIROCIN 2 % EX OINT
1.0000 "application " | TOPICAL_OINTMENT | Freq: Two times a day (BID) | CUTANEOUS | Status: AC
  Administered 2015-01-17 – 2015-01-22 (×10): 1 via NASAL

## 2015-01-17 MED ORDER — ALBUTEROL SULFATE (2.5 MG/3ML) 0.083% IN NEBU
2.5000 mg | INHALATION_SOLUTION | RESPIRATORY_TRACT | Status: DC | PRN
  Administered 2015-01-20: 2.5 mg via RESPIRATORY_TRACT
  Filled 2015-01-17: qty 3

## 2015-01-17 MED ORDER — IPRATROPIUM-ALBUTEROL 0.5-2.5 (3) MG/3ML IN SOLN
3.0000 mL | Freq: Four times a day (QID) | RESPIRATORY_TRACT | Status: DC
  Administered 2015-01-17 – 2015-01-18 (×5): 3 mL via RESPIRATORY_TRACT
  Filled 2015-01-17 (×5): qty 3

## 2015-01-17 NOTE — ED Notes (Addendum)
Awake. Verbally responsive. A/O x4. Resp even and unlabored. Continues to have audible exp wheezing. ABC's intact. SR on monitor. IV infusing NS at 9ml/hr without difficulty.

## 2015-01-17 NOTE — ED Notes (Signed)
RT at bedside giving respiratory treatment.

## 2015-01-17 NOTE — ED Notes (Signed)
Dr Ashok Cordia made aware of abnormal lactic.

## 2015-01-17 NOTE — ED Notes (Signed)
Pt returned from CT scan.

## 2015-01-17 NOTE — Consult Note (Signed)
PULMONARY / CRITICAL CARE MEDICINE   Name: [name] MRN: [id number] DOB: 1955-10-04    ADMISSION DATE:  01/17/2015 CONSULTATION DATE:  8/27  REFERRING MD :  [facility]  CHIEF COMPLAINT: AMS  INITIAL PRESENTATION: Lrthargic  STUDIES:  [place] abd at bedside 8/27.    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 He was found today(8/27)lyin in his own feces and was transported to [facility] ED. [facility] service requested paracentesis but due to lower ext cellulitis, small amount of fluid, tap was deferred at this time. Agree with admission, abx, diuresis and possible hospice consult. PCCM will be available if needed and will revaluate for paracentesis in future.  PAST MEDICAL HISTORY :   has a past medical history of Neuropathy; Diabetes mellitus; Bipolar affect, depressed; Hypertension; Arthritis; Stroke; Cirrhosis; Alcohol abuse; Chronic pain; Cocaine abuse; Muscle spasm; Encephalopathy, hepatic; Detached retina; COPD (chronic obstructive pulmonary disease); Bronchitis; Barrett's esophagus; GERD (gastroesophageal reflux disease); and Anemia.  has past surgical history that includes Fracture surgery; Esophagogastroduodenoscopy (04/04/2012); Esophagogastroduodenoscopy (Left, 03/13/2013); Eye surgery (8 months ago both eyes); Vasectomy; Pars plana vitrectomy (Left, 07/08/2013); Intraocular lens removal (Left, 07/08/2013); Placement and suture of secondary intraoculaer lens (Left, 07/08/2013); Esophagogastroduodenoscopy (N/A, 01/16/2014); Colonoscopy (N/A, 01/17/2014); Esophagogastroduodenoscopy (N/A, 08/30/2014); Esophagogastroduodenoscopy (N/A, 11/30/2014); and Esophagogastroduodenoscopy (N/A, 12/15/2014). Prior to Admission medications   Medication Sig Start Date End Date Taking? Authorizing Provider  amitriptyline (ELAVIL) 25  MG tablet Take 1 tablet (25 mg total) by mouth at bedtime. 10/05/14  Yes Nita Sells, MD  CALCIUM-VITAMIN D PO Take 100 Units by mouth daily.   Yes Historical Provider, MD  clonazePAM (KLONOPIN) 0.25 MG disintegrating tablet Take 1 tablet (0.25 mg total) by mouth 2 (two) times daily. 12/20/14  Yes Thurnell Lose, MD  diclofenac sodium (VOLTAREN) 1 % GEL Apply 4 g topically as needed (for back of leg pain).   Yes Historical Provider, MD  feeding supplement (BOOST / RESOURCE BREEZE) LIQD Take 1 Container by mouth 3 (three) times daily between meals. 12/20/14  Yes Thurnell Lose, MD  feeding supplement, ENSURE ENLIVE, (ENSURE ENLIVE) LIQD Take 237 mLs by mouth 2 (two) times daily between meals. 11/13/14  Yes Maryann Mikhail, DO  folic acid (FOLVITE) 1 MG tablet Take 1 tablet (1 mg total) by mouth daily. 10/05/14  Yes Nita Sells, MD  furosemide (LASIX) 40 MG tablet Take 1 tablet (40 mg total) by mouth daily. 01/15/15  Yes Julianne Rice, MD  lactulose (CHRONULAC) 10 GM/15ML solution Take 45 mLs (30 g total) by mouth 3 (three) times daily. 12/03/14  Yes Annita Brod, MD  LANTUS SOLOSTAR 100 UNIT/ML Solostar Pen Inject 20 Units into the skin daily at 10 pm. 12/20/14  Yes Thurnell Lose, MD  magnesium oxide (MAG-OX) 400 (241.3 MG) MG tablet Take 1 tablet (400 mg total) by mouth 2 (two) times daily. 09/03/14  Yes Reyne Dumas, MD  methocarbamol (ROBAXIN) 500 MG tablet Take 2 tablets (1,000 mg total) by mouth every 8 (eight) hours as needed. Patient taking differently: Take 1,000 mg by mouth every 8 (eight) hours as needed for muscle spasms.  01/15/15  Yes Julianne Rice, MD  Multiple Vitamin (MULTIVITAMIN WITH MINERALS) TABS tablet Take 1 tablet by mouth daily. 07/31/14  Yes Geradine Girt, DO  NOVOLOG FLEXPEN  100 UNIT/ML FlexPen Inject 6 Units into the skin 3 (three) times daily with meals. 11/24/14  Yes Historical Provider, MD  Oxycodone HCl 10 MG TABS Take 1 tablet (10 mg total) by mouth 3  (three) times daily. 01/15/15  Yes Julianne Rice, MD  pantoprazole (PROTONIX) 40 MG tablet Take 1 tablet (40 mg total) by mouth daily. 12/03/14  Yes Annita Brod, MD  propranolol (INDERAL) 20 MG tablet Take 20 mg by mouth 2 (two) times daily. 09/24/14  Yes Historical Provider, MD  spironolactone (ALDACTONE) 50 MG tablet Take 1 tablet (50 mg total) by mouth daily. 12/03/14  Yes Annita Brod, MD  thiamine (VITAMIN B-1) 100 MG tablet Take 100 mg by mouth daily.   Yes Historical Provider, MD  XIFAXAN 550 MG TABS tablet Take 550 mg by mouth 2 (two) times daily.  10/29/14  Yes Historical Provider, MD   No Known Allergies  FAMILY HISTORY:  indicated that his mother is alive. He indicated that his father is deceased. He indicated that his maternal grandmother is deceased. He indicated that his maternal grandfather is deceased. He indicated that his paternal grandmother is deceased. He indicated that his paternal grandfather is deceased.  SOCIAL HISTORY:  reports that he has been smoking Cigarettes.  He has a 30 pack-year smoking history. He has never used smokeless tobacco. He reports that he drinks alcohol. He reports that he uses illicit drugs (Cocaine).  REVIEW OF SYSTEMS:  NA  SUBJECTIVE:   VITAL SIGNS: Temp:  [95.3 F (35.2 C)-97.5 F (36.4 C)] 95.3 F (35.2 C) (08/27 1444) Pulse Rate:  [92-99] 99 (08/27 1530) Resp:  [13-14] 14 (08/27 1530) BP: (148-182)/(74-104) 166/74 mmHg (08/27 1530) SpO2:  [96 %-100 %] 100 % (08/27 1530) HEMODYNAMICS:   VENTILATOR SETTINGS:   INTAKE / OUTPUT: No intake or output data in the 24 hours ending 01/17/15 1605  PHYSICAL EXAMINATION: General:  Frail wm Neuro:  Lethargic, follows some commands HEENT: +jvd, dry oral mucosa Cardiovascular:  HSR RRR Lungs:  Exp wheezes Abdomen:  Distended, + bs Musculoskeletal:  Lower ext huge edema and cellulitis Skin:  Cool and dry  LABS:  CBC  Recent Labs Lab 01/14/15 2258 01/17/15 1135  WBC 5.8 5.3    HGB 8.5* 9.3*  HCT 25.6* 27.8*  PLT 48* 60*   Coag's  Recent Labs Lab 01/17/15 1135  INR 1.54*   BMET  Recent Labs Lab 01/14/15 2258 01/17/15 1135  NA 130* 132*  K 4.9 5.2*  CL 103 106  CO2 18* 19*  BUN 57* 56*  CREATININE 2.11* 1.92*  GLUCOSE 127* 131*   Electrolytes  Recent Labs Lab 01/14/15 2258 01/17/15 1135  CALCIUM 8.3* 8.1*  MG  --  2.1   Sepsis Markers  Recent Labs Lab 01/17/15 1322  LATICACIDVEN 3.38*   ABG No results for input(s): PHART, PCO2ART, PO2ART in the last 168 hours. Liver Enzymes  Recent Labs Lab 01/15/15 0010 01/17/15 1135  AST 85* 89*  ALT 42 45  ALKPHOS 141* 155*  BILITOT 2.7* 3.2*  ALBUMIN 2.3* 2.5*   Cardiac Enzymes No results for input(s): TROPONINI, PROBNP in the last 168 hours. Glucose No results for input(s): GLUCAP in the last 168 hours.  Imaging Ct Head Wo Contrast  01/17/2015   CLINICAL DATA:  59 year old male found unconscious on floor. Altered mental status.  EXAM: CT HEAD WITHOUT CONTRAST  TECHNIQUE: Contiguous axial images were obtained from the base of the skull through the vertex without intravenous contrast.  COMPARISON:  Head CT 12/2014.  FINDINGS: Mild cerebral atrophy. Patchy and confluent areas of decreased attenuation are noted throughout the deep and periventricular white matter of the cerebral hemispheres bilaterally, compatible with chronic microvascular ischemic disease. No acute intracranial abnormalities. Specifically, no evidence of acute intracranial hemorrhage, no definite findings of acute/subacute cerebral ischemia, no mass, mass effect, hydrocephalus or abnormal intra or extra-axial fluid collections. Visualized paranasal sinuses and mastoids are well pneumatized. No acute displaced skull fractures are identified.  IMPRESSION: 1. No acute intracranial abnormalities. 2. Mild cerebral atrophy with mild chronic microvascular ischemic changes in the cerebral white matter.   Electronically Signed    By: Vinnie Langton M.D.   On: 01/17/2015 14:30   Dg Chest Port 1 View  01/17/2015   CLINICAL DATA:  BILATERAL lower extremity swelling for 2-3 days, LEFT rib pain, shortness of breath, hypertension, diabetes mellitus, COPD, smoker  EXAM: PORTABLE CHEST - 1 VIEW  COMPARISON:  Portable exam 1152 hours compared to 01/14/2015  FINDINGS: Upper normal size of cardiac silhouette.  Rotated to the LEFT.  Mediastinal contours normal.  Coarsened interstitial markings in the mid to lower lungs question infiltrate.  No pleural effusion or pneumothorax.  Bones unremarkable.  IMPRESSION: Coarsened interstitial markings in the mid to lower lungs new since previous exam question interstitial infiltrates which could represent edema or infection.   Electronically Signed   By: Lavonia Dana M.D.   On: 01/17/2015 12:28     ASSESSMENT / PLAN:  PULMONARY OETT A: Hx of tobacco abuse COPD  P:   O2 Hx of intubation Monitor   CARDIOVASCULAR CVL A:  Volume overload P:  Diuresis Per [facility] Monitor for hypoteension  RENAL A:   Renal insuff Hyperkalemia Lactic acidosis P:   Per [facility]  GASTROINTESTINAL A:   Ascites GI protection P:   Possible paracentesis in future  HEMATOLOGIC A:   Thrombocytopenia P:  Follow plts  INFECTIOUS A:   Cellulitis lower ext P:   BCx2 8/27>> UC  8/27>> Sputum Abx:  8/27 vanc>> 8/27 zoysn>>  ENDOCRINE A:   DM2  P:   ssi  NEUROLOGIC A:   Lethargic curently(ammonia level 5). 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 He was found today(8/27)lyin in his own feces and was transported to [facility] ED. [facility] service requested paracentesis but due  to lower ext cellulitis, small amount of fluid, tap was deferred at this time. Agree with admission, abx, diuresis and possible hospice consult. PCCM will be available if needed and will revaluate for paracentesis in future.  Richardson Landry Minor ACNP Maryanna Shape PCCM Pager [phone] till 3 pm If no answer page [phone] 01/17/2015, 4:30 PM

## 2015-01-17 NOTE — ED Notes (Signed)
Unable to obtain blood cultures. Pt has a bed upstairs available.

## 2015-01-17 NOTE — Progress Notes (Signed)
ANTIBIOTIC CONSULT NOTE - INITIAL  Pharmacy Consult for Vancomycin, Zosyn Indication: sepsis  No Known Allergies  Patient Measurements:    Vital Signs: Temp: 95.3 F (35.2 C) (08/27 1444) Temp Source: Rectal (08/27 1444) BP: 148/91 mmHg (08/27 1138) Pulse Rate: 92 (08/27 1138) Intake/Output from previous day:   Intake/Output from this shift:    Labs:  Recent Labs  01/14/15 2258 01/17/15 1135  WBC 5.8 5.3  HGB 8.5* 9.3*  PLT 48* 60*  CREATININE 2.11* 1.92*   Estimated Creatinine Clearance: 54.3 mL/min (by C-G formula based on Cr of 1.92). No results for input(s): VANCOTROUGH, VANCOPEAK, VANCORANDOM, GENTTROUGH, GENTPEAK, GENTRANDOM, TOBRATROUGH, TOBRAPEAK, TOBRARND, AMIKACINPEAK, AMIKACINTROU, AMIKACIN in the last 72 hours.   Microbiology: No results found for this or any previous visit (from the past 720 hour(s)).  Medical History: Past Medical History  Diagnosis Date  . Neuropathy   . Diabetes mellitus   . Bipolar affect, depressed   . Hypertension   . Arthritis   . Stroke     Mini stroke about 48yrs ago  . Cirrhosis   . Alcohol abuse   . Chronic pain   . Cocaine abuse   . Muscle spasm     both legs  . Encephalopathy, hepatic   . Detached retina   . COPD (chronic obstructive pulmonary disease)     emphysema  . Bronchitis   . Barrett's esophagus   . GERD (gastroesophageal reflux disease)     has ulcer  . Anemia     Medications:  Anti-infectives    Start     Dose/Rate Route Frequency Ordered Stop   01/18/15 1400  vancomycin (VANCOCIN) 1,250 mg in sodium chloride 0.9 % 250 mL IVPB     1,250 mg 166.7 mL/hr over 90 Minutes Intravenous Every 24 hours 01/17/15 1506     01/17/15 2200  piperacillin-tazobactam (ZOSYN) IVPB 3.375 g     3.375 g 12.5 mL/hr over 240 Minutes Intravenous 3 times per day 01/17/15 1506     01/17/15 1600  vancomycin (VANCOCIN) 2,500 mg in sodium chloride 0.9 % 500 mL IVPB     2,500 mg 250 mL/hr over 120 Minutes Intravenous   Once 01/17/15 1504     01/17/15 1515  piperacillin-tazobactam (ZOSYN) IVPB 3.375 g     3.375 g 100 mL/hr over 30 Minutes Intravenous  Once 01/17/15 1501       Assessment: 59 y.o. male with Hx EtOH cirrhosis with coagulopathy, CVA, COPD admitted 01/17/2015 after being found down at home.  Noted with erythema of LE, thought to be from venous stasis.  Now pharmacy consulted to dose vancomycin and Zosyn for sepsis, presumably from suspected cellulitis.  8/27 >> vancomycin >> 8/27 >> Zosyn >>    Tmax: afebrile WBCs: wnl Renal: SCr 1.92 (recent lowest 1.48 in Epic); CrCl 54 CG, 42 N No PCT, LA elevated   Goal of Therapy:  Vancomycin trough level 15-20 mcg/ml Eradication of infection Appropriate antibiotic dosing for indication and renal function  Plan:  Day 1 antibiotics Vancomycin 2500 mg IV now, then 1250 mg IV q24 hr Measure vancomycin trough levels at steady state as indicated Zosyn 3.375 g IV given once over 30 minutes, then every 8 hrs by 4-hr infusion  Follow clinical course, renal function, culture results as available.  Follow for de-escalation of antibiotics and LOT   Reuel Boom, PharmD, BCPS Pager: [phone] 01/17/2015, 3:08 PM

## 2015-01-17 NOTE — Progress Notes (Signed)
Lactic acid 3.4, [facility] made aware.

## 2015-01-17 NOTE — ED Notes (Signed)
NP at bedside to doing ultrasound.

## 2015-01-17 NOTE — ED Notes (Signed)
Hospitalist at bedside 

## 2015-01-17 NOTE — ED Notes (Addendum)
Pt received from [facility]. Family found pt on floor, laying in feces. It is unknown how long patient had been there for. Audible wheezing present. Pt is confused- at baseline. Bilateral lower extremities are red w/ 3-4+ pitting edema. Pt has scattered abrasions and skin tears to bilateral upper arms.

## 2015-01-17 NOTE — ED Provider Notes (Addendum)
CSN: [id number]     Arrival date & time 01/17/15  1058 History   First MD Initiated Contact with Patient 01/17/15 1115     Chief Complaint  Patient presents with  . Fall     (Consider location/radiation/quality/duration/timing/severity/associated sxs/prior Treatment) Patient is a 59 y.o. male presenting with fall. The history is provided by the patient and the EMS personnel. The history is limited by the condition of the patient.  Fall  Per EMS report, with hx liver disease, cva, copd, was found on floor my family members, lying in his own feces.  Family/pt do not know how long patient was on floor, or how he got to floor - ?syncope. Pt confused, and is very poor historian, very limited insight - level 5 caveat.  Pt denies specific pain or injury. No headache. No chest pain. Denies fever or chills.      Past Medical History  Diagnosis Date  . Neuropathy   . Diabetes mellitus   . Bipolar affect, depressed   . Hypertension   . Arthritis   . Stroke     Mini stroke about 65yrs ago  . Cirrhosis   . Alcohol abuse   . Chronic pain   . Cocaine abuse   . Muscle spasm     both legs  . Encephalopathy, hepatic   . Detached retina   . COPD (chronic obstructive pulmonary disease)     emphysema  . Bronchitis   . Barrett's esophagus   . GERD (gastroesophageal reflux disease)     has ulcer  . Anemia    Past Surgical History  Procedure Laterality Date  . Fracture surgery      Leg and arm 44yrs ago  . Esophagogastroduodenoscopy  04/04/2012    Procedure: ESOPHAGOGASTRODUODENOSCOPY (EGD);  Surgeon: Irene Shipper, MD;  Location: [facility] ENDOSCOPY;  Service: Endoscopy;  Laterality: N/A;  . Esophagogastroduodenoscopy Left 03/13/2013    Procedure: ESOPHAGOGASTRODUODENOSCOPY (EGD);  Surgeon: Arta Silence, MD;  Location: [facility] ENDOSCOPY;  Service: Endoscopy;  Laterality: Left;  Marland Kitchen Eye surgery  8 months ago both eyes    cataracts both eyes, detached eye, gas pocket  . Vasectomy    . Pars plana  vitrectomy Left 07/08/2013    Procedure: PARS PLANA VITRECTOMY WITH 25 GAUGE;  Surgeon: Hurman Horn, MD;  Location: [place];  Service: Ophthalmology;  Laterality: Left;  . Intraocular lens removal Left 07/08/2013    Procedure: REMOVAL OF INTRAOCULAR LENS;  Surgeon: Hurman Horn, MD;  Location: [place];  Service: Ophthalmology;  Laterality: Left;  . Placement and suture of secondary intraocular lens Left 07/08/2013    Procedure: PLACEMENT AND SUTURE OF SECONDARY INTRAOCULAR LENS;  Surgeon: Hurman Horn, MD;  Location: [place];  Service: Ophthalmology;  Laterality: Left;  Insertion of Anterior Capsule Intraocular Lens   . Esophagogastroduodenoscopy N/A 01/16/2014    Procedure: ESOPHAGOGASTRODUODENOSCOPY (EGD);  Surgeon: Winfield Cunas., MD;  Location: [facility] ENDOSCOPY;  Service: Endoscopy;  Laterality: N/A;  . Colonoscopy N/A 01/17/2014    Procedure: COLONOSCOPY;  Surgeon: Winfield Cunas., MD;  Location: [facility] ENDOSCOPY;  Service: Endoscopy;  Laterality: N/A;  possible banding  . Esophagogastroduodenoscopy N/A 08/30/2014    Procedure: ESOPHAGOGASTRODUODENOSCOPY (EGD);  Surgeon: Wilford Corner, MD;  Location: [facility] ENDOSCOPY;  Service: Endoscopy;  Laterality: N/A;  bedside  . Esophagogastroduodenoscopy N/A 11/30/2014    Procedure: ESOPHAGOGASTRODUODENOSCOPY (EGD);  Surgeon: Wilford Corner, MD;  Location: [facility] ENDOSCOPY;  Service: Endoscopy;  Laterality: N/A;  . Esophagogastroduodenoscopy N/A 12/15/2014  Procedure: ESOPHAGOGASTRODUODENOSCOPY (EGD);  Surgeon: Teena Irani, MD;  Location: [facility] ENDOSCOPY;  Service: Endoscopy;  Laterality: N/A;   Family History  Problem Relation Age of Onset  . Hypotension Mother    Social History  Substance Use Topics  . Smoking status: Current Every Day Smoker -- 1.00 packs/day for 30 years    Types: Cigarettes  . Smokeless tobacco: Never Used  . Alcohol Use: 0.0 oz/week     Comment: 12 pk beer daily  06/2013 - no alcohol since 11/2012       Review of Systems  Unable to  perform ROS: Mental status change  patient confused, poor historian - level 5 caveat.    Allergies  Review of patient's allergies indicates no known allergies.  Home Medications   Prior to Admission medications   Medication Sig Start Date End Date Taking? Authorizing Provider  amitriptyline (ELAVIL) 25 MG tablet Take 1 tablet (25 mg total) by mouth at bedtime. 10/05/14   Nita Sells, MD  CALCIUM-VITAMIN D PO Take 100 Units by mouth daily.    Historical Provider, MD  clonazePAM (KLONOPIN) 0.25 MG disintegrating tablet Take 1 tablet (0.25 mg total) by mouth 2 (two) times daily. 12/20/14   Thurnell Lose, MD  diclofenac sodium (VOLTAREN) 1 % GEL Apply 4 g topically as needed (for back of leg pain).    Historical Provider, MD  feeding supplement (BOOST / RESOURCE BREEZE) LIQD Take 1 Container by mouth 3 (three) times daily between meals. 12/20/14   Thurnell Lose, MD  feeding supplement, ENSURE ENLIVE, (ENSURE ENLIVE) LIQD Take 237 mLs by mouth 2 (two) times daily between meals. 11/13/14   Maryann Mikhail, DO  folic acid (FOLVITE) 1 MG tablet Take 1 tablet (1 mg total) by mouth daily. 10/05/14   Nita Sells, MD  furosemide (LASIX) 40 MG tablet Take 1 tablet (40 mg total) by mouth daily. 01/15/15   Julianne Rice, MD  lactulose (CHRONULAC) 10 GM/15ML solution Take 45 mLs (30 g total) by mouth 3 (three) times daily. 12/03/14   Annita Brod, MD  LANTUS SOLOSTAR 100 UNIT/ML Solostar Pen Inject 20 Units into the skin daily at 10 pm. 12/20/14   Thurnell Lose, MD  magnesium oxide (MAG-OX) 400 (241.3 MG) MG tablet Take 1 tablet (400 mg total) by mouth 2 (two) times daily. 09/03/14   Reyne Dumas, MD  methocarbamol (ROBAXIN) 500 MG tablet Take 2 tablets (1,000 mg total) by mouth every 8 (eight) hours as needed. 01/15/15   Julianne Rice, MD  Multiple Vitamin (MULTIVITAMIN WITH MINERALS) TABS tablet Take 1 tablet by mouth daily. 07/31/14   Geradine Girt, DO  NOVOLOG FLEXPEN 100 UNIT/ML  FlexPen Inject 6 Units into the skin 3 (three) times daily with meals. 11/24/14   Historical Provider, MD  Oxycodone HCl 10 MG TABS Take 1 tablet (10 mg total) by mouth 3 (three) times daily. 01/15/15   Julianne Rice, MD  pantoprazole (PROTONIX) 40 MG tablet Take 1 tablet (40 mg total) by mouth daily. 12/03/14   Annita Brod, MD  propranolol (INDERAL) 20 MG tablet Take 20 mg by mouth 2 (two) times daily. 09/24/14   Historical Provider, MD  spironolactone (ALDACTONE) 50 MG tablet Take 1 tablet (50 mg total) by mouth daily. 12/03/14   Annita Brod, MD  thiamine (VITAMIN B-1) 100 MG tablet Take 100 mg by mouth daily.    Historical Provider, MD  XIFAXAN 550 MG TABS tablet Take 550 mg by mouth 2 (two) times  daily.  10/29/14   Historical Provider, MD   BP 148/91 mmHg  Pulse 92  Temp(Src) 97.5 F (36.4 C) (Oral)  SpO2 100% Physical Exam  Constitutional:  Disheveled, chronically ill appearing.   HENT:  Head: Atraumatic.  Mouth/Throat: Oropharynx is clear and moist.  Eyes: Conjunctivae are normal. Pupils are equal, round, and reactive to light. No scleral icterus.  Neck: Neck supple. No tracheal deviation present.  No stiffness or rigidity  Cardiovascular: Normal rate, regular rhythm, normal heart sounds and intact distal pulses.   Pulmonary/Chest: Effort normal. No accessory muscle usage. No respiratory distress. He has wheezes.  Abdominal: Soft. Bowel sounds are normal. He exhibits distension. He exhibits no mass. There is no tenderness. There is no rebound and no guarding.  +ascites  Genitourinary:  No cva tenderness. +scrotal edema/diffuse edema. No scrotal or testicular tenderness.   Musculoskeletal: Normal range of motion. He exhibits edema.  Diffuse bil edema. Diffuse erythema bilateral lower legs. CTLS spine, non tender, aligned, no step off. Good rom bil ext without focal bony tenderness.   Neurological: He is alert.  Pt is awake and alert appearing. Confused. Slow to respond to  questions. Moves bil ext purposefully.  Skin: Skin is warm and dry.  Psychiatric:  Slow to respond, confused appearing.   Nursing note and vitals reviewed.   ED Course  Procedures (including critical care time) Labs Review   Results for orders placed or performed during the hospital encounter of 01/17/15  CBC  Result Value Ref Range   WBC 5.3 4.0 - 10.5 K/uL   RBC 3.03 (L) 4.22 - 5.81 MIL/uL   Hemoglobin 9.3 (L) 13.0 - 17.0 g/dL   HCT 27.8 (L) 39.0 - 52.0 %   MCV 91.7 78.0 - 100.0 fL   MCH 30.7 26.0 - 34.0 pg   MCHC 33.5 30.0 - 36.0 g/dL   RDW 21.4 (H) 11.5 - 15.5 %   Platelets 60 (L) 150 - 400 K/uL  Comprehensive metabolic panel  Result Value Ref Range   Sodium 132 (L) 135 - 145 mmol/L   Potassium 5.2 (H) 3.5 - 5.1 mmol/L   Chloride 106 101 - 111 mmol/L   CO2 19 (L) 22 - 32 mmol/L   Glucose, Bld 131 (H) 65 - 99 mg/dL   BUN 56 (H) 6 - 20 mg/dL   Creatinine, Ser 1.92 (H) 0.61 - 1.24 mg/dL   Calcium 8.1 (L) 8.9 - 10.3 mg/dL   Total Protein 6.1 (L) 6.5 - 8.1 g/dL   Albumin 2.5 (L) 3.5 - 5.0 g/dL   AST 89 (H) 15 - 41 U/L   ALT 45 17 - 63 U/L   Alkaline Phosphatase 155 (H) 38 - 126 U/L   Total Bilirubin 3.2 (H) 0.3 - 1.2 mg/dL   GFR calc non Af Amer 37 (L) >60 mL/min   GFR calc Af Amer 42 (L) >60 mL/min   Anion gap 7 5 - 15  Protime-INR  Result Value Ref Range   Prothrombin Time 18.6 (H) 11.6 - 15.2 seconds   INR 1.54 (H) 0.00 - 1.49  Ammonia  Result Value Ref Range   Ammonia 53 (H) 9 - 35 umol/L  Magnesium  Result Value Ref Range   Magnesium 2.1 1.7 - 2.4 mg/dL  Urinalysis, Routine w reflex microscopic (not at [facility])  Result Value Ref Range   Color, Urine YELLOW YELLOW   APPearance CLEAR CLEAR   Specific Gravity, Urine 1.014 1.005 - 1.030   pH 5.5 5.0 -  8.0   Glucose, UA NEGATIVE NEGATIVE mg/dL   Hgb urine dipstick TRACE (A) NEGATIVE   Bilirubin Urine NEGATIVE NEGATIVE   Ketones, ur NEGATIVE NEGATIVE mg/dL   Protein, ur NEGATIVE NEGATIVE mg/dL    Urobilinogen, UA 0.2 0.0 - 1.0 mg/dL   Nitrite NEGATIVE NEGATIVE   Leukocytes, UA TRACE (A) NEGATIVE  Urine rapid drug screen (hosp performed)  Result Value Ref Range   Opiates POSITIVE (A) NONE DETECTED   Cocaine NONE DETECTED NONE DETECTED   Benzodiazepines POSITIVE (A) NONE DETECTED   Amphetamines NONE DETECTED NONE DETECTED   Tetrahydrocannabinol NONE DETECTED NONE DETECTED   Barbiturates NONE DETECTED NONE DETECTED  Urine microscopic-add on  Result Value Ref Range   Squamous Epithelial / LPF FEW (A) RARE   WBC, UA 0-2 <3 WBC/hpf   RBC / HPF 0-2 <3 RBC/hpf   Bacteria, UA FEW (A) RARE   Casts HYALINE CASTS (A) NEGATIVE   Urine-Other MUCOUS PRESENT   I-Stat CG4 Lactic Acid, ED  Result Value Ref Range   Lactic Acid, Venous 3.38 (HH) 0.5 - 2.0 mmol/L   Comment NOTIFIED PHYSICIAN   Type and screen  Result Value Ref Range   ABO/RH(D) O POS    Antibody Screen NEG    Sample Expiration 01/20/2015      Dg Chest Port 1 View  01/17/2015   CLINICAL DATA:  BILATERAL lower extremity swelling for 2-3 days, LEFT rib pain, shortness of breath, hypertension, diabetes mellitus, COPD, smoker  EXAM: PORTABLE CHEST - 1 VIEW  COMPARISON:  Portable exam 1152 hours compared to 01/14/2015  FINDINGS: Upper normal size of cardiac silhouette.  Rotated to the LEFT.  Mediastinal contours normal.  Coarsened interstitial markings in the mid to lower lungs question infiltrate.  No pleural effusion or pneumothorax.  Bones unremarkable.  IMPRESSION: Coarsened interstitial markings in the mid to lower lungs new since previous exam question interstitial infiltrates which could represent edema or infection.   Electronically Signed   By: Lavonia Dana M.D.   On: 01/17/2015 12:28      I have personally reviewed and evaluated these images and lab results as part of my medical decision-making.   EKG Interpretation   Date/Time:  Saturday January 17 2015 14:30:18 EDT Ventricular Rate:  95 PR Interval:  167 QRS  Duration: 89 QT Interval:  367 QTC Calculation: 461 R Axis:   5 Text Interpretation:  Sinus rhythm Low voltage, precordial leads Baseline  wander in lead(s) V1 No significant change since last tracing Confirmed by  Maurion Walkowiak  MD, Lennette Bihari ([id number]) on 01/17/2015 2:32:46 PM      MDM   Iv ns. Labs.  Reviewed nursing notes and prior charts for additional history.   Albuterol and atrovent neb re wheezing.  Recheck wheezing mildly improved.  Ammonia level elevated - lactulose po.  Recheck pt, awake, alert, confused. Pt unable to stand or ambulate.  Given weakness, fall vs syncope, confusion/altered mental status, and unable to walk, will admit to medical service - during inpatient treatment, pt may benefit from SW consult re safer placement/ECF, as well as palliative care consult to more clearly establish treatment goals.   Lactate is elevated, however appears chronically elevated. Also pt afeb.  Pt has symmetric bilateral lower leg erythema, felt most c/w venous stasis.  Will require monitoring for fever, and/or other sign of infection.       Lajean Saver, MD 01/17/15 [phone]

## 2015-01-17 NOTE — Progress Notes (Signed)
MD responded no new orders received will continue to monitor and report.

## 2015-01-17 NOTE — H&P (Addendum)
History and Physical  JOHNRYAN Baker SEG:[id number] DOB: 1955/09/03 DOA: 01/17/2015  Referring physician: Dr. Lajean Saver, [place] PCP: Barbette Merino, MD  Outpatient Specialists:  1. Not known  Chief Complaint: Found on floor by family members  HPI: [name] is a 59 y.o. male , apparently single and lives alone, PMH of alcoholic cirrhosis, portal hypertension, varices, ascites, coagulopathy, VDRF, hepatic encephalopathy, chronic diastolic CHF, type II DM, cocaine abuse, alcohol abuse, variceal GI bleed, COPD, frequent hospitalizations (8 in the last 6 months), brought to the [facility] ED on 01/17/15 after he was found on floor by family members lying in his own feces. No family members at bedside to provide history. Patient is confused and does not know that he is in the hospital and denies any complaints and hence his report is not reliable. Patient apparently has baseline confusion and family walked into his house and found him in that situation. He has had similar presentations in the past. No further history obtainable. In the ED, patient noted to be hypothermic with a rectal temperature of 95.3, wheezing-improved after nebulizations, workup significant for sodium 132, potassium 5.2, BUN 56, creatinine 1.9 to, abnormal LFTs, INR 1.5, ammonia 53, hemoglobin 9.3, platelets 60, CT head without acute abnormalities and chest x-ray suggesting interstitial infiltrates which could represent edema or infection. Hospitalist admission was requested.   Review of Systems: All systems reviewed and apart from history of presenting illness, are negative.  Past Medical History  Diagnosis Date  . Neuropathy   . Diabetes mellitus   . Bipolar affect, depressed   . Hypertension   . Arthritis   . Stroke     Mini stroke about 86yrs ago  . Cirrhosis   . Alcohol abuse   . Chronic pain   . Cocaine abuse   . Muscle spasm     both legs  . Encephalopathy, hepatic   . Detached retina   . COPD  (chronic obstructive pulmonary disease)     emphysema  . Bronchitis   . Barrett's esophagus   . GERD (gastroesophageal reflux disease)     has ulcer  . Anemia    Past Surgical History  Procedure Laterality Date  . Fracture surgery      Leg and arm 38yrs ago  . Esophagogastroduodenoscopy  04/04/2012    Procedure: ESOPHAGOGASTRODUODENOSCOPY (EGD);  Surgeon: Irene Shipper, MD;  Location: [facility] ENDOSCOPY;  Service: Endoscopy;  Laterality: N/A;  . Esophagogastroduodenoscopy Left 03/13/2013    Procedure: ESOPHAGOGASTRODUODENOSCOPY (EGD);  Surgeon: Arta Silence, MD;  Location: [facility] ENDOSCOPY;  Service: Endoscopy;  Laterality: Left;  Marland Kitchen Eye surgery  8 months ago both eyes    cataracts both eyes, detached eye, gas pocket  . Vasectomy    . Pars plana vitrectomy Left 07/08/2013    Procedure: PARS PLANA VITRECTOMY WITH 25 GAUGE;  Surgeon: Hurman Horn, MD;  Location: [place];  Service: Ophthalmology;  Laterality: Left;  . Intraocular lens removal Left 07/08/2013    Procedure: REMOVAL OF INTRAOCULAR LENS;  Surgeon: Hurman Horn, MD;  Location: [place];  Service: Ophthalmology;  Laterality: Left;  . Placement and suture of secondary intraocular lens Left 07/08/2013    Procedure: PLACEMENT AND SUTURE OF SECONDARY INTRAOCULAR LENS;  Surgeon: Hurman Horn, MD;  Location: [place];  Service: Ophthalmology;  Laterality: Left;  Insertion of Anterior Capsule Intraocular Lens   . Esophagogastroduodenoscopy N/A 01/16/2014    Procedure: ESOPHAGOGASTRODUODENOSCOPY (EGD);  Surgeon: Winfield Cunas., MD;  Location: [facility] ENDOSCOPY;  Service: Endoscopy;  Laterality: N/A;  . Colonoscopy N/A 01/17/2014    Procedure: COLONOSCOPY;  Surgeon: Winfield Cunas., MD;  Location: [facility] ENDOSCOPY;  Service: Endoscopy;  Laterality: N/A;  possible banding  . Esophagogastroduodenoscopy N/A 08/30/2014    Procedure: ESOPHAGOGASTRODUODENOSCOPY (EGD);  Surgeon: Wilford Corner, MD;  Location: [facility] ENDOSCOPY;  Service: Endoscopy;  Laterality: N/A;   bedside  . Esophagogastroduodenoscopy N/A 11/30/2014    Procedure: ESOPHAGOGASTRODUODENOSCOPY (EGD);  Surgeon: Wilford Corner, MD;  Location: [facility] ENDOSCOPY;  Service: Endoscopy;  Laterality: N/A;  . Esophagogastroduodenoscopy N/A 12/15/2014    Procedure: ESOPHAGOGASTRODUODENOSCOPY (EGD);  Surgeon: Teena Irani, MD;  Location: [facility] ENDOSCOPY;  Service: Endoscopy;  Laterality: N/A;   Social History:  reports that he has been smoking Cigarettes.  He has a 30 pack-year smoking history. He has never used smokeless tobacco. He reports that he drinks alcohol. He reports that he uses illicit drugs (Cocaine). Patient states that he is single and lives alone and apparently walks with the help of a cane. He claims that he has stopped drinking 3 years ago. Denies tobacco or other illicit substance abuse.  No Known Allergies  Family History  Problem Relation Age of Onset  . Hypotension Mother    unable to obtain from patient secondary to altered mental status.  Prior to Admission medications   Medication Sig Start Date End Date Taking? Authorizing Provider  amitriptyline (ELAVIL) 25 MG tablet Take 1 tablet (25 mg total) by mouth at bedtime. 10/05/14  Yes Nita Sells, MD  CALCIUM-VITAMIN D PO Take 100 Units by mouth daily.   Yes Historical Provider, MD  clonazePAM (KLONOPIN) 0.25 MG disintegrating tablet Take 1 tablet (0.25 mg total) by mouth 2 (two) times daily. 12/20/14  Yes Thurnell Lose, MD  diclofenac sodium (VOLTAREN) 1 % GEL Apply 4 g topically as needed (for back of leg pain).   Yes Historical Provider, MD  feeding supplement (BOOST / RESOURCE BREEZE) LIQD Take 1 Container by mouth 3 (three) times daily between meals. 12/20/14  Yes Thurnell Lose, MD  feeding supplement, ENSURE ENLIVE, (ENSURE ENLIVE) LIQD Take 237 mLs by mouth 2 (two) times daily between meals. 11/13/14  Yes Maryann Mikhail, DO  folic acid (FOLVITE) 1 MG tablet Take 1 tablet (1 mg total) by mouth daily. 10/05/14  Yes  Nita Sells, MD  furosemide (LASIX) 40 MG tablet Take 1 tablet (40 mg total) by mouth daily. 01/15/15  Yes Julianne Rice, MD  lactulose (CHRONULAC) 10 GM/15ML solution Take 45 mLs (30 g total) by mouth 3 (three) times daily. 12/03/14  Yes Annita Brod, MD  LANTUS SOLOSTAR 100 UNIT/ML Solostar Pen Inject 20 Units into the skin daily at 10 pm. 12/20/14  Yes Thurnell Lose, MD  magnesium oxide (MAG-OX) 400 (241.3 MG) MG tablet Take 1 tablet (400 mg total) by mouth 2 (two) times daily. 09/03/14  Yes Reyne Dumas, MD  methocarbamol (ROBAXIN) 500 MG tablet Take 2 tablets (1,000 mg total) by mouth every 8 (eight) hours as needed. Patient taking differently: Take 1,000 mg by mouth every 8 (eight) hours as needed for muscle spasms.  01/15/15  Yes Julianne Rice, MD  Multiple Vitamin (MULTIVITAMIN WITH MINERALS) TABS tablet Take 1 tablet by mouth daily. 07/31/14  Yes Jessica U Vann, DO  NOVOLOG FLEXPEN 100 UNIT/ML FlexPen Inject 6 Units into the skin 3 (three) times daily with meals. 11/24/14  Yes Historical Provider, MD  Oxycodone HCl 10 MG TABS Take 1 tablet (10 mg  total) by mouth 3 (three) times daily. 01/15/15  Yes Julianne Rice, MD  pantoprazole (PROTONIX) 40 MG tablet Take 1 tablet (40 mg total) by mouth daily. 12/03/14  Yes Annita Brod, MD  propranolol (INDERAL) 20 MG tablet Take 20 mg by mouth 2 (two) times daily. 09/24/14  Yes Historical Provider, MD  spironolactone (ALDACTONE) 50 MG tablet Take 1 tablet (50 mg total) by mouth daily. 12/03/14  Yes Annita Brod, MD  thiamine (VITAMIN B-1) 100 MG tablet Take 100 mg by mouth daily.   Yes Historical Provider, MD  XIFAXAN 550 MG TABS tablet Take 550 mg by mouth 2 (two) times daily.  10/29/14  Yes Historical Provider, MD   Physical Exam: Filed Vitals:   01/17/15 1106 01/17/15 1138 01/17/15 1253 01/17/15 1444  BP:  148/91    Pulse:  92    Temp:  97.5 F (36.4 C)  95.3 F (35.2 C)  TempSrc:  Oral  Rectal  SpO2: 96% 100% 98%       General exam: Moderately built and poorly nourished, disheveled/unkempt, chronically ill-looking middle-aged male lying comfortably propped up on gurney.  Head, eyes and ENT: Nontraumatic and normocephalic. Pupils equally reacting to light and accommodation. Oral mucosa moist. Very poor oral hygiene with several teeth with caries. No acute findings on throat exam.  Neck: Supple. No JVD, carotid bruit or thyromegaly.  Lymphatics: No lymphadenopathy.  Respiratory system: Reduced breath sounds in the bases with few bibasal crackles. Scattered occasional bilateral wheezing and expiratory rhonchi. No increased work of breathing.  Cardiovascular system: S1 and S2 heard, RRR. No JVD, murmurs, gallops. 3+ pitting bilateral leg edema extending up to the external genitalia, anterior abdominal wall and sacrum.  Gastrointestinal system: Abdomen is markedly distended but not tense, nontender and soft. Normal bowel sounds heard. No organomegaly or masses appreciated. Ascites + + +. Fluid thrill +  Central nervous system: Alert and oriented only to self. No focal neurological deficits.  Extremities: Symmetric 5 x 5 power. Peripheral pulses symmetrically felt. Patchy redness and warmth bilateral legs. Superficial cuts and bruises on legs and rest of body.  Skin: Several superficial cuts and bruises over face, upper and lower extremities  Musculoskeletal system: Negative exam.  Psychiatry: Pleasant and cooperative.   Labs on Admission:  Basic Metabolic Panel:  Recent Labs Lab 01/14/15 2258 01/17/15 1135  NA 130* 132*  K 4.9 5.2*  CL 103 106  CO2 18* 19*  GLUCOSE 127* 131*  BUN 57* 56*  CREATININE 2.11* 1.92*  CALCIUM 8.3* 8.1*  MG  --  2.1   Liver Function Tests:  Recent Labs Lab 01/15/15 0010 01/17/15 1135  AST 85* 89*  ALT 42 45  ALKPHOS 141* 155*  BILITOT 2.7* 3.2*  PROT 6.0* 6.1*  ALBUMIN 2.3* 2.5*   No results for input(s): LIPASE, AMYLASE in the last 168  hours.  Recent Labs Lab 01/17/15 1130  AMMONIA 53*   CBC:  Recent Labs Lab 01/14/15 2258 01/17/15 1135  WBC 5.8 5.3  HGB 8.5* 9.3*  HCT 25.6* 27.8*  MCV 91.8 91.7  PLT 48* 60*   Cardiac Enzymes: No results for input(s): CKTOTAL, CKMB, CKMBINDEX, TROPONINI in the last 168 hours.  BNP (last 3 results)  Recent Labs  02/08/14 1830 02/10/14 1547  PROBNP 966.3* 805.5*   CBG: No results for input(s): GLUCAP in the last 168 hours.  Radiological Exams on Admission: Ct Head Wo Contrast  01/17/2015   CLINICAL DATA:  59 year old male found unconscious on  floor. Altered mental status.  EXAM: CT HEAD WITHOUT CONTRAST  TECHNIQUE: Contiguous axial images were obtained from the base of the skull through the vertex without intravenous contrast.  COMPARISON:  Head CT 12/2014.  FINDINGS: Mild cerebral atrophy. Patchy and confluent areas of decreased attenuation are noted throughout the deep and periventricular white matter of the cerebral hemispheres bilaterally, compatible with chronic microvascular ischemic disease. No acute intracranial abnormalities. Specifically, no evidence of acute intracranial hemorrhage, no definite findings of acute/subacute cerebral ischemia, no mass, mass effect, hydrocephalus or abnormal intra or extra-axial fluid collections. Visualized paranasal sinuses and mastoids are well pneumatized. No acute displaced skull fractures are identified.  IMPRESSION: 1. No acute intracranial abnormalities. 2. Mild cerebral atrophy with mild chronic microvascular ischemic changes in the cerebral white matter.   Electronically Signed   By: Vinnie Langton M.D.   On: 01/17/2015 14:30   Dg Chest Port 1 View  01/17/2015   CLINICAL DATA:  BILATERAL lower extremity swelling for 2-3 days, LEFT rib pain, shortness of breath, hypertension, diabetes mellitus, COPD, smoker  EXAM: PORTABLE CHEST - 1 VIEW  COMPARISON:  Portable exam 1152 hours compared to 01/14/2015  FINDINGS: Upper normal size  of cardiac silhouette.  Rotated to the LEFT.  Mediastinal contours normal.  Coarsened interstitial markings in the mid to lower lungs question infiltrate.  No pleural effusion or pneumothorax.  Bones unremarkable.  IMPRESSION: Coarsened interstitial markings in the mid to lower lungs new since previous exam question interstitial infiltrates which could represent edema or infection.   Electronically Signed   By: Lavonia Dana M.D.   On: 01/17/2015 12:28    EKG: Independently reviewed. Sinus rhythm, low voltage, no acute changes and QTC 461 ms.  Assessment/Plan Principal Problem:   Sepsis Active Problems:   Alcohol abuse   HTN (hypertension)   Hepatic encephalopathy   Alcoholic cirrhosis of liver with ascites   Fall   Cellulitis   Sepsis, present on admission - Patient hypothermic along with elevated lactate (which may also be due to cirrhosis) - Source: Bilateral leg cellulitis, rule out SBP (patient denies abdominal pain) and pneumonia is felt less likely - IV fluids not initiated per sepsis protocol due to concern for anasarca and pulmonary edema - Start IV vancomycin and Zosyn per pharmacy - Follow blood and urine culture results - Check pro calcitonin - CCM consulted for diagnostic and therapeutic (<4 L) ascitic tap  Bilateral leg cellulitis - IV Zosyn and vancomycin as above. Diuretics should help  Anasarca - Secondary to cirrhosis, hypoalbuminemia and CHF - DC IV fluids. - IV Lasix 60 mg 1. Reassess in a.m. as far as diuretic dosing and needs  Acute on chronic diastolic CHF - DC IV fluids. IV Lasix as above. Intake and output. Monitor  Hypothermia - Possibly from sepsis. Management as above - We will check TSH, cortisol. Blood glucose okay.  Acute on stage III chronic kidney disease/non-anion gap metabolic acidosis - Baseline creatinine not sure:? 1.4-1.7. Admitted with creatinine of 1.9. - We will check urine sodium.? Hepatorenal syndrome - Follow BMP post Lasix in  a.m.  Mild hyperkalemia - IV Lasix and follow BMP in a.m.  Hepatic encephalopathy - CT head without acute findings - Continue lactulose and treat for sepsis  Alcohol abuse - Patient denies recent alcohol use. Check blood alcohol level - Placed on CIWA protocol  Type II DM - SSI  HTN - Controlled  Alcoholic cirrhosis, portal hypertension, varices, ascites, coagulopathy, - Principal driver of his failure  to thrive  Failure to thrive - Multifactorial due to significant comorbidities. Frequent hospitalizations. Palliative care consultation for goals of care.   COPD - Concerned for pulmonary edema. Patient wheezing. IV Lasix, bronchodilator nebs. Has history of VDRF during prior hospitalization hence monitor in stepdown. Consult CCM if he worsens  Thrombocytopenia - Secondary to advanced cirrhosis. Follow CBCs. No reported active bleeding.  Possible falls - PT and OT evaluation. May need placement  Elevated lactate - Due to cirrhosis, sepsis. Follow lactate levels in a.m.   DVT prophylaxis: SCDs Code Status: Full  Family Communication: discussed with daughter Ms. Aaditya Letizia (she's in [place])- updated his care in detail and answered questions. Agreed to Palliative consult. Disposition Plan: To be determined. Admitted to stepdown   Time spent: 49 minutes  Alayne Estrella, MD, FACP, FHM. [facility] Pager [phone]  If 7PM-7AM, please contact night-coverage [email or web address] Password [facility] 01/17/2015, 3:07 PM

## 2015-01-18 DIAGNOSIS — R188 Other ascites: Secondary | ICD-10-CM

## 2015-01-18 DIAGNOSIS — N183 Chronic kidney disease, stage 3 (moderate): Secondary | ICD-10-CM

## 2015-01-18 DIAGNOSIS — K7031 Alcoholic cirrhosis of liver with ascites: Secondary | ICD-10-CM

## 2015-01-18 DIAGNOSIS — R29898 Other symptoms and signs involving the musculoskeletal system: Secondary | ICD-10-CM | POA: Insufficient documentation

## 2015-01-18 DIAGNOSIS — Z515 Encounter for palliative care: Secondary | ICD-10-CM | POA: Insufficient documentation

## 2015-01-18 DIAGNOSIS — R601 Generalized edema: Secondary | ICD-10-CM | POA: Insufficient documentation

## 2015-01-18 DIAGNOSIS — N179 Acute kidney failure, unspecified: Secondary | ICD-10-CM

## 2015-01-18 DIAGNOSIS — R531 Weakness: Secondary | ICD-10-CM

## 2015-01-18 DIAGNOSIS — E875 Hyperkalemia: Secondary | ICD-10-CM

## 2015-01-18 DIAGNOSIS — L0390 Cellulitis, unspecified: Secondary | ICD-10-CM

## 2015-01-18 LAB — CBC
HCT: 22.9 % — ABNORMAL LOW (ref 39.0–52.0)
HEMOGLOBIN: 7.6 g/dL — AB (ref 13.0–17.0)
MCH: 30.5 pg (ref 26.0–34.0)
MCHC: 33.2 g/dL (ref 30.0–36.0)
MCV: 92 fL (ref 78.0–100.0)
Platelets: 57 10*3/uL — ABNORMAL LOW (ref 150–400)
RBC: 2.49 MIL/uL — AB (ref 4.22–5.81)
RDW: 22 % — ABNORMAL HIGH (ref 11.5–15.5)
WBC: 4.9 10*3/uL (ref 4.0–10.5)

## 2015-01-18 LAB — GLUCOSE, CAPILLARY
GLUCOSE-CAPILLARY: 188 mg/dL — AB (ref 65–99)
GLUCOSE-CAPILLARY: 129 mg/dL — AB (ref 65–99)
Glucose-Capillary: 96 mg/dL (ref 65–99)

## 2015-01-18 LAB — COMPREHENSIVE METABOLIC PANEL
ALBUMIN: 2.1 g/dL — AB (ref 3.5–5.0)
ALK PHOS: 119 U/L (ref 38–126)
ALT: 37 U/L (ref 17–63)
ANION GAP: 5 (ref 5–15)
AST: 73 U/L — ABNORMAL HIGH (ref 15–41)
BUN: 60 mg/dL — ABNORMAL HIGH (ref 6–20)
CALCIUM: 7.9 mg/dL — AB (ref 8.9–10.3)
CHLORIDE: 109 mmol/L (ref 101–111)
CO2: 19 mmol/L — AB (ref 22–32)
Creatinine, Ser: 2.03 mg/dL — ABNORMAL HIGH (ref 0.61–1.24)
GFR calc non Af Amer: 34 mL/min — ABNORMAL LOW (ref >60)
GFR, EST AFRICAN AMERICAN: 40 mL/min — AB (ref >60)
GLUCOSE: 109 mg/dL — AB (ref 65–99)
Potassium: 5.2 mmol/L — ABNORMAL HIGH (ref 3.5–5.1)
SODIUM: 133 mmol/L — AB (ref 135–145)
Total Bilirubin: 2.9 mg/dL — ABNORMAL HIGH (ref 0.3–1.2)
Total Protein: 5.1 g/dL — ABNORMAL LOW (ref 6.5–8.1)

## 2015-01-18 LAB — PROTIME-INR
INR: 1.92 — ABNORMAL HIGH (ref 0.00–1.49)
PROTHROMBIN TIME: 21.9 s — AB (ref 11.6–15.2)

## 2015-01-18 LAB — LACTIC ACID, PLASMA: LACTIC ACID, VENOUS: 2 mmol/L (ref 0.5–2.0)

## 2015-01-18 LAB — CREATININE, URINE, RANDOM: CREATININE, URINE: 124.43 mg/dL

## 2015-01-18 LAB — CORTISOL: Cortisol, Plasma: 16.7 ug/dL

## 2015-01-18 LAB — SODIUM, URINE, RANDOM

## 2015-01-18 LAB — PROCALCITONIN: Procalcitonin: 0.22 ng/mL

## 2015-01-18 MED ORDER — ALBUMIN HUMAN 5 % IV SOLN
12.5000 g | Freq: Once | INTRAVENOUS | Status: AC
  Administered 2015-01-18: 12.5 g via INTRAVENOUS
  Filled 2015-01-18: qty 250

## 2015-01-18 MED ORDER — OXYCODONE-ACETAMINOPHEN 5-325 MG PO TABS
2.0000 | ORAL_TABLET | Freq: Once | ORAL | Status: AC
  Administered 2015-01-19: 2 via ORAL
  Filled 2015-01-18: qty 2

## 2015-01-18 MED ORDER — IPRATROPIUM-ALBUTEROL 0.5-2.5 (3) MG/3ML IN SOLN
3.0000 mL | Freq: Three times a day (TID) | RESPIRATORY_TRACT | Status: DC
  Administered 2015-01-19 – 2015-01-21 (×7): 3 mL via RESPIRATORY_TRACT
  Filled 2015-01-18 (×7): qty 3

## 2015-01-18 MED ORDER — CLONAZEPAM 0.125 MG PO TBDP
0.2500 mg | ORAL_TABLET | Freq: Two times a day (BID) | ORAL | Status: DC
  Administered 2015-01-18 – 2015-01-23 (×11): 0.25 mg via ORAL
  Filled 2015-01-18 (×11): qty 2

## 2015-01-18 MED ORDER — OXYCODONE-ACETAMINOPHEN 5-325 MG PO TABS
1.0000 | ORAL_TABLET | Freq: Four times a day (QID) | ORAL | Status: DC | PRN
  Administered 2015-01-19: 2 via ORAL
  Filled 2015-01-18: qty 2

## 2015-01-18 NOTE — Consult Note (Signed)
PULMONARY / CRITICAL CARE MEDICINE   Name: [name] MRN: [id number] DOB: January 24, 1956    ADMISSION DATE:  01/17/2015 CONSULTATION DATE:  8/27  REFERRING MD :  [facility]  CHIEF COMPLAINT: AMS  INITIAL PRESENTATION: Lrthargic  STUDIES:  [place] abd at bedside 8/27.   SIGNIFICANT EVENTS: No pressors, not sob, no vent, crt rise, ARF  SUBJECTIVE: no distress in bed  VITAL SIGNS: Temp:  [95.3 F (35.2 C)-99.3 F (37.4 C)] 97.9 F (36.6 C) (08/28 0800) Pulse Rate:  [85-103] 85 (08/28 0600) Resp:  [11-14] 14 (08/28 0600) BP: (103-182)/(45-104) 108/55 mmHg (08/28 0600) SpO2:  [95 %-100 %] 98 % (08/28 0600) Weight:  [115.4 kg (254 lb 6.6 oz)-117.4 kg (258 lb 13.1 oz)] 117.4 kg (258 lb 13.1 oz) (08/28 0500) HEMODYNAMICS:   VENTILATOR SETTINGS:   INTAKE / OUTPUT:  Intake/Output Summary (Last 24 hours) at 01/18/15 0818 Last data filed at 01/18/15 0600  Gross per 24 hour  Intake   1240 ml  Output    500 ml  Net    740 ml    PHYSICAL EXAMINATION: General:  Frail wm Neuro:  Follows commands, nonfocal HEENT: +jvd, dry oral mucosa Cardiovascular:  HSR RRR Lungs:  coarse Abdomen:  Distended, + bs, wave, no r Musculoskeletal:  Lower ext huge edema and erythema Skin:  Cool and dry  LABS:  CBC  Recent Labs Lab 01/14/15 2258 01/17/15 1135 01/18/15 0350  WBC 5.8 5.3 4.9  HGB 8.5* 9.3* 7.6*  HCT 25.6* 27.8* 22.9*  PLT 48* 60* 57*   Coag's  Recent Labs Lab 01/17/15 1135 01/18/15 0350  INR 1.54* 1.92*   BMET  Recent Labs Lab 01/14/15 2258 01/17/15 1135 01/18/15 0350  NA 130* 132* 133*  K 4.9 5.2* 5.2*  CL 103 106 109  CO2 18* 19* 19*  BUN 57* 56* 60*  CREATININE 2.11* 1.92* 2.03*  GLUCOSE 127* 131* 109*   Electrolytes  Recent Labs Lab 01/14/15 2258 01/17/15 1135 01/18/15 0350  CALCIUM 8.3* 8.1* 7.9*  MG  --  2.1  --    Sepsis Markers  Recent Labs Lab 01/17/15 1322 01/17/15 1815 01/18/15 0350  LATICACIDVEN 3.38* 3.4* 2.0  PROCALCITON  --   0.20 0.22   ABG No results for input(s): PHART, PCO2ART, PO2ART in the last 168 hours. Liver Enzymes  Recent Labs Lab 01/15/15 0010 01/17/15 1135 01/18/15 0350  AST 85* 89* 73*  ALT 42 45 37  ALKPHOS 141* 155* 119  BILITOT 2.7* 3.2* 2.9*  ALBUMIN 2.3* 2.5* 2.1*   Cardiac Enzymes No results for input(s): TROPONINI, PROBNP in the last 168 hours. Glucose  Recent Labs Lab 01/17/15 1732 01/17/15 2002 01/17/15 2159 01/18/15 0803  GLUCAP 127* 109* 116* 96    Imaging Ct Head Wo Contrast  01/17/2015   CLINICAL DATA:  59 year old male found unconscious on floor. Altered mental status.  EXAM: CT HEAD WITHOUT CONTRAST  TECHNIQUE: Contiguous axial images were obtained from the base of the skull through the vertex without intravenous contrast.  COMPARISON:  Head CT 12/2014.  FINDINGS: Mild cerebral atrophy. Patchy and confluent areas of decreased attenuation are noted throughout the deep and periventricular white matter of the cerebral hemispheres bilaterally, compatible with chronic microvascular ischemic disease. No acute intracranial abnormalities. Specifically, no evidence of acute intracranial hemorrhage, no definite findings of acute/subacute cerebral ischemia, no mass, mass effect, hydrocephalus or abnormal intra or extra-axial fluid collections. Visualized paranasal sinuses and mastoids are well pneumatized. No acute displaced skull fractures are  identified.  IMPRESSION: 1. No acute intracranial abnormalities. 2. Mild cerebral atrophy with mild chronic microvascular ischemic changes in the cerebral white matter.   Electronically Signed   By: Vinnie Langton M.D.   On: 01/17/2015 14:30   Dg Chest Port 1 View  01/17/2015   CLINICAL DATA:  BILATERAL lower extremity swelling for 2-3 days, LEFT rib pain, shortness of breath, hypertension, diabetes mellitus, COPD, smoker  EXAM: PORTABLE CHEST - 1 VIEW  COMPARISON:  Portable exam 1152 hours compared to 01/14/2015  FINDINGS: Upper normal  size of cardiac silhouette.  Rotated to the LEFT.  Mediastinal contours normal.  Coarsened interstitial markings in the mid to lower lungs question infiltrate.  No pleural effusion or pneumothorax.  Bones unremarkable.  IMPRESSION: Coarsened interstitial markings in the mid to lower lungs new since previous exam question interstitial infiltrates which could represent edema or infection.   Electronically Signed   By: Lavonia Dana M.D.   On: 01/17/2015 12:28     ASSESSMENT / PLAN:  PULMONARY A: Hx of tobacco abuse COPD  P:   O2 not required  IS  CARDIOVASCULAR CVL A:  Volume overload P:  Diuresis, consider hold tele  RENAL A:   Renal failure, r/o ATN, r/o heptorenal, r/o intravascular depletion Hyperkalemia mild Lactic acidosis cleared P:   Would hold lasix follow crt trend Consider albumin, high risk hepatorenal and urine na concerning Avoid para for now, may exac renal failure Repeat chem in am  Saline 50 is a reasonable approach  GASTROINTESTINAL A:   Ascites, cirrhosis GI protection P:   Possible paracentesis in future, would hold off as may exac renal failure for now Diet Dc tylenol, certainly cant handle more than 1 gram in 1 day  HEMATOLOGIC A:   Thrombocytopenia, coagulapthy - liver dz P:  Follow plts scd  INFECTIOUS A:   Cellulitis lower ext P:   BCx2 8/27>> UC  8/27>> Sputum Abx:  8/27 vanc>> 8/27 zoysn>>  Pct neg, narrow in am   ENDOCRINE A:   DM2  P:   ssi  NEUROLOGIC A:   Lethargic curently (ammonia level 5). Non compliant with meds Neg CT head Home benzo NOT drank for 3 years P:   RASS goal: 0 Dc ativan Add home klonopin Dc ciwa   FAMILY  - Updates:   - Inter-disciplinary family meet or Palliative Care meeting due by:  day 7  Lavon Paganini. Titus Mould, MD, [place] Pgr: [facility]'

## 2015-01-18 NOTE — Progress Notes (Addendum)
PROGRESS NOTE    [name] TKZ:[id number] DOB: 02-29-1956 DOA: 01/17/2015 PCP: Barbette Merino, MD  HPI/Brief narrative 59 y.o. male , apparently single and lives alone, PMH of alcoholic cirrhosis, portal hypertension, varices, ascites, coagulopathy, VDRF, hepatic encephalopathy, chronic diastolic CHF, type II DM, cocaine abuse, alcohol abuse, variceal GI bleed, COPD, frequent hospitalizations (8 in the last 6 months), brought to the [facility] ED on 01/17/15 after he was found on floor by family members lying in his own feces. No family members at bedside to provide history. Patient is confused and does not know that he is in the hospital and denies any complaints and hence his report is not reliable. In the ED, patient noted to be hypothermic with a rectal temperature of 95.3, wheezing-improved after nebulizations, workup significant for sodium 132, potassium 5.2, BUN 56, creatinine 1.9 to, abnormal LFTs, INR 1.5, ammonia 53, hemoglobin 9.3, platelets 60, CT head without acute abnormalities and chest x-ray suggesting interstitial infiltrates which could represent edema or infection. Patient was admitted for bilateral lower extremity cellulitis, hyponatremia, hepatic encephalopathy.    Assessment/Plan:  Bilateral leg cellulitis - Patient was hypothermic and had elevated lactate on admission but did not meet full SIRS criteria - Started empirically on IV vancomycin and Zosyn per pharmacy - Follow blood and urine culture results. Pro-calcitonin low. - Cellulitis improving. If cultures remain negative, consider narrowing antibiotic spectrum in a.m.  Anasarca/ascites - Secondary to cirrhosis, hypoalbuminemia and CHF - Due to concern for decompensated CHF, received a dose of IV Lasix 60 mg in ED. However CCM consult regarding elevated lactate and hence started him on gentle IV fluid hydration from the ED, to clear lactate. - Due to slightly worsened creatinine and concern for hepatorenal  syndrome (urine sodium <10), will hold off diuretics and continue gentle IV fluid hydration. - Eventually will need to restart Lasix +/- Aldactone based on renal function. - CCM input appreciated. Feel that low index of suspicion for SBP and concern for worsening renal function with paracentesis and hence put off for now.  Acute on chronic diastolic CHF - For now patient not dyspneic or in extremis. Monitor closely while on gentle IV fluid hydration. Hold Lasix secondary to concern for hepatorenal syndrome.  Hypothermia - Possibly from infection - TSH, cortisol and blood glucose normal.  - Resolved   Acute on stage III chronic kidney disease/non-anion gap metabolic acidosis - Baseline creatinine not sure:? 1.4-1.7. Admitted with creatinine of 1.9. - Urine sodium <10. Concern for hepatorenal syndrome - Creatinine has mildly increased to 2.03 - Hold Lasix. Brief IV fluids & IV albumin. Follow BMP in a.m.  Mild hyperkalemia - gentle IV fluids and follow BMP in a.m.  Hepatic encephalopathy - CT head without acute findings - Continue lactulose and treat for sepsis - Mental status seems slightly improved compared to admission   Alcohol abuse - Patient denies recent alcohol use (states that he has not had alcohol for 3 years). blood alcohol level on admission negative - changed CIWA protocol to home dose of Klonopin.  Type II DM - SSI - Controlled   HTN - Controlled of meds   Alcoholic cirrhosis, portal hypertension, varices, ascites, coagulopathy, - Principal driver of his failure to thrive  Failure to thrive - Multifactorial due to significant comorbidities. Frequent hospitalizations. Palliative care consulted for goals of care.  COPD - Has history of VDRF during prior hospitalization hence monitor in stepdown.  - Better/stable.  Thrombocytopenia - Secondary to advanced cirrhosis. Follow CBCs.  No reported active bleeding.  Possible falls - PT and OT evaluation. May need  placement  Elevated lactate - Due to cirrhosis and acute infection. Resolved  Chronic anemia - Baseline hemoglobin probably in the 8-9 grams range. Hemoglobin 7.6 may be dilutional. Follow CBC in a.m. Transfuse if hemoglobin less than 7 g per DL.    DVT prophylaxis: SCDs Code Status: Full  Family Communication: discussed with daughter Ms. Chanceler Pullin (she's in [place]) on 8/27- updated his care in detail and answered questions. Agreed to Palliative consult. She was planning to come to [facility] and stated that she was going to take him back to [place].  Disposition Plan: Keep in SDU for another 24 hours.    Consultants:  CCM  Procedures:  Foley catheter   Antibiotics:  IV Zosyn 8/27 >  IV vancomycin 8/27 >  Subjective: Denies complaints. No dyspnea. As per nursing, urinary incontinence overnight and Foley catheter placed. DOE on turning. Abdominal distention without pain. Had 1 BM last night.   Objective: Filed Vitals:   01/18/15 0500 01/18/15 0600 01/18/15 0800 01/18/15 0850  BP:  108/55 128/54   Pulse:  85 84   Temp:   97.9 F (36.6 C)   TempSrc:   Oral   Resp:  14 15   Height:      Weight: 117.4 kg (258 lb 13.1 oz)     SpO2:  98% 97% 97%    Intake/Output Summary (Last 24 hours) at 01/18/15 1005 Last data filed at 01/18/15 0800  Gross per 24 hour  Intake   1390 ml  Output    575 ml  Net    815 ml   Filed Weights   01/17/15 1800 01/18/15 0500  Weight: 115.4 kg (254 lb 6.6 oz) 117.4 kg (258 lb 13.1 oz)     Exam:  General exam:  moderately built and poorly nourished chronically ill-looking male lying comfortably supine in bed. Looks much improved compared to on admission. Respiratory system: Clear anteriorly. Slightly diminished breath sounds in the bases with occasional basal crackles. Rest of lung fields clear to auscultation. No increased work of breathing. Cardiovascular system: S1 & S2 heard, RRR. No JVD, murmurs, gallops, clicks. 2+ pitting  bilateral leg edema. Gastrointestinal system: Abdomen is distended but not tense, soft and nontender. Ascites + +. Fluid thrill +.. Normal bowel sounds heard. Central nervous system: Alert and oriented to self and place . No focal neurological deficits. Extremities: Symmetric 5 x 5 power. Bilateral legs with decreased swelling, redness and warmth.  Skin: Several superficial cuts and bruises over face, upper and lower extremities   Data Reviewed: Basic Metabolic Panel:  Recent Labs Lab 01/14/15 2258 01/17/15 1135 01/18/15 0350  NA 130* 132* 133*  K 4.9 5.2* 5.2*  CL 103 106 109  CO2 18* 19* 19*  GLUCOSE 127* 131* 109*  BUN 57* 56* 60*  CREATININE 2.11* 1.92* 2.03*  CALCIUM 8.3* 8.1* 7.9*  MG  --  2.1  --    Liver Function Tests:  Recent Labs Lab 01/15/15 0010 01/17/15 1135 01/18/15 0350  AST 85* 89* 73*  ALT 42 45 37  ALKPHOS 141* 155* 119  BILITOT 2.7* 3.2* 2.9*  PROT 6.0* 6.1* 5.1*  ALBUMIN 2.3* 2.5* 2.1*   No results for input(s): LIPASE, AMYLASE in the last 168 hours.  Recent Labs Lab 01/17/15 1130  AMMONIA 53*   CBC:  Recent Labs Lab 01/14/15 2258 01/17/15 1135 01/18/15 0350  WBC 5.8 5.3 4.9  HGB  8.5* 9.3* 7.6*  HCT 25.6* 27.8* 22.9*  MCV 91.8 91.7 92.0  PLT 48* 60* 57*   Cardiac Enzymes:  Recent Labs Lab 01/17/15 1130  CKTOTAL 170   BNP (last 3 results)  Recent Labs  02/08/14 1830 02/10/14 1547  PROBNP 966.3* 805.5*   CBG:  Recent Labs Lab 01/17/15 1732 01/17/15 2002 01/17/15 2159 01/18/15 0803  GLUCAP 127* 109* 116* 96    Recent Results (from the past 240 hour(s))  MRSA PCR Screening     Status: Abnormal   Collection Time: 01/17/15  4:50 PM  Result Value Ref Range Status   MRSA by PCR POSITIVE (A) NEGATIVE Final    Comment:        The GeneXpert MRSA Assay (FDA approved for NASAL specimens only), is one component of a comprehensive MRSA colonization surveillance program. It is not intended to diagnose MRSA infection  nor to guide or monitor treatment for MRSA infections. RESULT CALLED TO, READ BACK BY AND VERIFIED WITH: G.GARLAND,RN AT 2040 ON 01/17/15 BY W.SHEA          Studies: Ct Head Wo Contrast  01/17/2015   CLINICAL DATA:  59 year old male found unconscious on floor. Altered mental status.  EXAM: CT HEAD WITHOUT CONTRAST  TECHNIQUE: Contiguous axial images were obtained from the base of the skull through the vertex without intravenous contrast.  COMPARISON:  Head CT 12/2014.  FINDINGS: Mild cerebral atrophy. Patchy and confluent areas of decreased attenuation are noted throughout the deep and periventricular white matter of the cerebral hemispheres bilaterally, compatible with chronic microvascular ischemic disease. No acute intracranial abnormalities. Specifically, no evidence of acute intracranial hemorrhage, no definite findings of acute/subacute cerebral ischemia, no mass, mass effect, hydrocephalus or abnormal intra or extra-axial fluid collections. Visualized paranasal sinuses and mastoids are well pneumatized. No acute displaced skull fractures are identified.  IMPRESSION: 1. No acute intracranial abnormalities. 2. Mild cerebral atrophy with mild chronic microvascular ischemic changes in the cerebral white matter.   Electronically Signed   By: Vinnie Langton M.D.   On: 01/17/2015 14:30   Dg Chest Port 1 View  01/17/2015   CLINICAL DATA:  BILATERAL lower extremity swelling for 2-3 days, LEFT rib pain, shortness of breath, hypertension, diabetes mellitus, COPD, smoker  EXAM: PORTABLE CHEST - 1 VIEW  COMPARISON:  Portable exam 1152 hours compared to 01/14/2015  FINDINGS: Upper normal size of cardiac silhouette.  Rotated to the LEFT.  Mediastinal contours normal.  Coarsened interstitial markings in the mid to lower lungs question infiltrate.  No pleural effusion or pneumothorax.  Bones unremarkable.  IMPRESSION: Coarsened interstitial markings in the mid to lower lungs new since previous exam  question interstitial infiltrates which could represent edema or infection.   Electronically Signed   By: Lavonia Dana M.D.   On: 01/17/2015 12:28        Scheduled Meds: . albumin human  12.5 g Intravenous Once  . antiseptic oral rinse  7 mL Mouth Rinse BID  . Chlorhexidine Gluconate Cloth  6 each Topical Q0600  . clonazePAM  0.25 mg Oral BID  . folic acid  1 mg Oral Daily  . insulin aspart  0-9 Units Subcutaneous TID WC  . ipratropium-albuterol  3 mL Nebulization Q6H  . lactulose  30 g Oral TID  . multivitamin with minerals  1 tablet Oral Daily  . mupirocin ointment  1 application Nasal BID  . pantoprazole  40 mg Oral Daily  . piperacillin-tazobactam (ZOSYN)  IV  3.375 g Intravenous  3 times per day  . rifaximin  550 mg Oral BID  . sodium chloride  3 mL Intravenous Q12H  . thiamine  100 mg Oral Daily   Or  . thiamine  100 mg Intravenous Daily  . vancomycin  1,250 mg Intravenous Q24H   Continuous Infusions: . sodium chloride Stopped (01/17/15 1406)  . sodium chloride 50 mL/hr at 01/18/15 0830    Principal Problem:   Sepsis Active Problems:   Alcohol abuse   HTN (hypertension)   Hepatic encephalopathy   Alcoholic cirrhosis of liver with ascites   Fall   Cellulitis   Lactic acidosis    Time spent: 45 minutes.    Vernell Leep, MD, FACP, FHM. [facility] Pager [phone]  If 7PM-7AM, please contact night-coverage [email or web address] Password [email or web address] 01/18/2015, 10:05 AM    LOS: 1 day

## 2015-01-18 NOTE — Care Management (Signed)
CM spoke with Serita Butcher via telephone. Confirms she is the patient's girlfriend. Patient lives in an apartment that belongs to her. She lives separately in a house she owns. She works as a Quarry manager, she is 59 years old and goes to the patients home 3 - 4 times a day to check on him. She is not sure what happened to him yesterday resulting in the admission because she is not with him 24 hours a day. She had been concerned about the edema in his legs. She ensures that he takes his medications when she visits him throughout the day. He uses a can and a walker. He was discharged to a SNF outside of [facility] in the past and "hated it." She has tried to get him to go into a facility because he needs 24 hour care but he refuses. His daughter wants to take him to [place] but Constance Holster states she knows he does not want to go there. States it is a lot for her to try to go back and forth to his apartment 3 - 4 times a daily (usually morning, lunch time and at night). She would like for him to have [facility] visits and a HHA when discharged and requested [facility]. His brother is currently in jail, expected to get out any day. His brother plans to take care of the patient 24 hours a day at the patient's apartment once he is released from jail. Constance Holster has completed paperwork at DSS attempting to get the patient some assistance for personal care. Per Constance Holster, the patient "has a place" at her home if needed at discharge. Presenter, broadcasting BSN CCM

## 2015-01-18 NOTE — Evaluation (Signed)
Physical Therapy Evaluation Patient Details Name: [name] MRN: [id number] DOB: 10-14-1955 Today's Date: 01/18/2015   History of Present Illness  Pt admitted by ambulance after being found on floor at home.  Pt dx sepsis with hx of DM, CVA, ETOH abuse with cirrhosis, COPD, bipolar and neuropathy  Clinical Impression  Pt admitted as above and presenting with functional mobility limitations 2* generalized weakness/deconditioning, edematous LEs, and ambulatory balance deficits.  Pt hopes to dc home but will need 24/7 assist and would benefit from follow up HHPT to maximize IND and safety.    Follow Up Recommendations Home health PT    Equipment Recommendations  None recommended by PT    Recommendations for Other Services OT consult     Precautions / Restrictions Precautions Precautions: Fall Restrictions Weight Bearing Restrictions: No      Mobility  Bed Mobility Overal bed mobility: Needs Assistance Bed Mobility: Supine to Sit     Supine to sit: Min assist;Mod assist     General bed mobility comments: assist to bring trunk to upright and to scoot to EOB  Transfers Overall transfer level: Needs assistance Equipment used: Rolling walker (2 wheeled) Transfers: Sit to/from Stand Sit to Stand: Min assist;+2 safety/equipment Stand pivot transfers: Min assist;+2 safety/equipment       General transfer comment: cues for transition position and use of UEs to self assist  Ambulation/Gait Ambulation/Gait assistance: Min assist;+2 safety/equipment Ambulation Distance (Feet): 15 Feet Assistive device: Rolling walker (2 wheeled) Gait Pattern/deviations: Step-through pattern;Decreased step length - right;Decreased step length - left;Trunk flexed;Shuffle;Wide base of support Gait velocity: decr   General Gait Details: cues for posture and position from RW  Stairs            Wheelchair Mobility    Modified Rankin (Stroke Patients Only)       Balance Overall  balance assessment: Needs assistance Sitting-balance support: No upper extremity supported Sitting balance-Leahy Scale: Fair     Standing balance support: Bilateral upper extremity supported Standing balance-Leahy Scale: Poor                               Pertinent Vitals/Pain Pain Assessment: No/denies pain    Home Living Family/patient expects to be discharged to:: Private residence Living Arrangements: Alone Available Help at Discharge: Family;Friend(s);Home health;Other (Comment) (unclear if 24/7 available) Type of Home: Apartment Home Access: Level entry     Home Layout: One level Home Equipment: Walker - 2 wheels;Bedside commode;Tub bench;Hand held shower head Additional Comments: Pt states he lives alone and his girlfriend helps him out but does not live there    Prior Function Level of Independence: Needs assistance   Gait / Transfers Assistance Needed: Uses RW vs cane (cane for house hold mobility and RW for community mobility)  ADL's / Homemaking Assistance Needed: Pt reports that his girlfriend, Constance Holster, assists him with bathing. He uses bench and BSC beside bed)  Comments: h/o falls     Hand Dominance   Dominant Hand: Right    Extremity/Trunk Assessment   Upper Extremity Assessment: Generalized weakness           Lower Extremity Assessment: Generalized weakness         Communication   Communication: No difficulties  Cognition Arousal/Alertness: Awake/alert Behavior During Therapy: WFL for tasks assessed/performed Overall Cognitive Status: Within Functional Limits for tasks assessed  General Comments General comments (skin integrity, edema, etc.): Edematous bil LEs    Exercises General Exercises - Lower Extremity Ankle Circles/Pumps: AROM;Both;20 reps;Supine      Assessment/Plan    PT Assessment Patient needs continued PT services  PT Diagnosis Difficulty walking   PT Problem List Decreased  strength;Decreased range of motion;Decreased activity tolerance;Decreased mobility;Decreased balance;Decreased knowledge of use of DME;Obesity  PT Treatment Interventions DME instruction;Gait training;Functional mobility training;Therapeutic activities;Therapeutic exercise;Balance training;Patient/family education   PT Goals (Current goals can be found in the Care Plan section) Acute Rehab PT Goals Patient Stated Goal: HOME PT Goal Formulation: With patient Time For Goal Achievement: 01/31/15 Potential to Achieve Goals: Fair    Frequency Min 3X/week   Barriers to discharge Decreased caregiver support ? availability of 24/7 assist    Co-evaluation               End of Session Equipment Utilized During Treatment: Gait belt Activity Tolerance: Patient tolerated treatment well;Patient limited by fatigue Patient left: in chair;with call bell/phone within reach;with nursing/sitter in room Nurse Communication: Mobility status         Time: 1188-6773 PT Time Calculation (min) (ACUTE ONLY): 34 min   Charges:   PT Evaluation $Initial PT Evaluation Tier I: 1 Procedure PT Treatments $Gait Training: 8-22 mins   PT G Codes:        Jaima Janney 2015/02/15, 4:21 PM

## 2015-01-18 NOTE — Care Management Note (Signed)
Case Management Note  Patient Details  Name: [name] MRN: [id number] Date of Birth: 24-Apr-1956  Subjective/Objective:                  Altered Mental Status  Action/Plan: Discharge planning  Expected Discharge Date:                  Expected Discharge Plan:  [place] (vs SNF)  In-House Referral:  Clinical Social Work  Discharge planning Services  CM Consult  Post Acute Care Choice:  Home Health Choice offered to:  Patient  DME Arranged:  N/A DME Agency:  NA  HH Arranged:    [place] Agency:  [place]  Status of Service:  In process, will continue to follow  Medicare Important Message Given:    Date Medicare IM Given:    Medicare IM give by:    Date Additional Medicare IM Given:    Additional Medicare Important Message give by:     If discussed at [place] of Stay Meetings, dates discussed:    Additional Comments: CM spoke with patient at the bedside. Patient states he plans to go to his girlfriend/finance's house when he is discharged. States she is a Quarry manager and will help provide his care. If home health services are ordered he would like to use [facility] which he has used in the past. Reports no issues related to obtaining, affording or taking his medications as prescribed. Uses [facility] to get to physician appointments. Denies missing appointments. Has a cane and walker he uses at home. States his brother who lives in [place] will be coming to see him here and will also assist him at home at discharge. Spoke with patient's nurse who was informed the patient and his girlfriend recently broke up. Patient provided verbal permission for CM to contact his daughter or his girlfriend to discuss his health/discharge plan. CM will continue to follow.  Apolonio Schneiders, RN 01/18/2015, 1:08 PM

## 2015-01-18 NOTE — Progress Notes (Signed)
Respiratory assessment done and patient scored a 7. Xray showed infiltrates new from possible edema or infection, but BBS represent clear diminished now with some occasional rhonchi. History of COPD noted, will continue nebs at Tid rate and reassess in three days or earlier if needed

## 2015-01-18 NOTE — Consult Note (Signed)
Consultation Note Date: 01/18/2015   Patient Name: [name]  DOB: 05-03-1956  MRN: [id number]  Age / Sex: 59 y.o., male   PCP: Elwyn Reach, MD Referring Physician: Modena Jansky, MD  Reason for Consultation: Establishing goals of care and code status discussions.   Palliative Care Assessment and Plan Summary of Established Goals of Care and Medical Treatment Preferences  59 y.o. male , lives alone, per case management, has a girlfriend Serita Butcher and also has a daughter who is a CNA who lives in [place].   PMH of alcoholic cirrhosis, portal hypertension, varices, ascites, coagulopathy, VDRF, hepatic encephalopathy, chronic diastolic CHF, type II DM, cocaine abuse, alcohol abuse, variceal GI bleed, COPD, frequent hospitalizations (8 in the last 6 months), brought to the [facility] ED on 01/17/15 after he was found on floor by family members lying in his own feces.    Patient was admitted for bilateral lower extremity cellulitis, hyponatremia, hepatic encephalopathy.  Palliative consult has been placed for code status and goals of care discussions given the patient's multiple end stage life limiting illnesses. The patient has been empirically started on IV vancomycin and Zosyn. It is noted that his cellulitis is gradually improving. Patient has anasarca/ascites secondary to cirrhosis, hypoalbuminemia and congestive heart failure. He also has acute on chronic diastolic congestive heart failure and acute on chronic stage III kidney disease.  The patient is awake and alert, sitting in a chair. He is still in the intensive care unit area. Patient is tearful while remembering his daughter. He states he has not seen her in a while. And states he understands he is really ill but believes he can get better.  Call placed and discussed with the patient's daughter in [place], [place] at [phone].  1. Discuss CODE STATUS in detail with patient's daughter [name]. She is agreeable to  DO NOT RESUSCITATE/DO NOT INTUBATE. However, she wants to directly call into the patient's room and have further discussions before we formally established DNR/DNI in the chart. Additionally, at this time, she is trying her best to get to [place] to visit the patient as soon as possible. She states that she is fully understands the medical recommendation that a CPR attempt or aggressive mechanical measures would not be beneficial to the patient towards the end of his life.   2. [name] is concerned about establishing herself as the patient's healthcare power of attorney. She states that she does not trust/does not want the patient's girlfriend [place]  making medical decisions. Discussed with [name] that as soon as she arrived here, chaplain services would be able to assist her and completion of advance care planning documents in particular the healthcare power of attorney document.  All questions answered to the best of my ability. [name] wishes to take the patient back with her to live in [place] along with her other family members. She states that the patient would be supervised and would have 24/7 care over there.   Thank you for allowing [place] to assist in the patient's care. Palliative team members will follow along.   Contacts/Participants in Discussion: Primary Decision Maker:    Patient, then his daughter [name] at [phone] HCPOA: no    Code Status/Advance Care Planning:  Full code. Extensive discussions with both the patient as well as daughter. Continue discussions regarding establishing DO NOT RESUSCITATE/DO NOT INTUBATE  Symptom Management:   No acute symptoms currently. Continue to follow.  Palliative Prophylaxis: Yes  Additional Recommendations (Limitations,  Scope, Preferences):  Continue to monitor, follow the patient and facilitate discussions between the patient, daughter to assist in medical decision-making. Psycho-social/Spiritual:   Support System: Has a  girlfriend who lives locally and has a daughter who lives in [place]  Desire for further Chaplaincy support:yes  Prognosis: Possibly weeks-months  Discharge Planning:  Pending further clinical course and further discussions    Values: Currently full code and life-prolonging/life maintaining but continuing discussions Life limiting illness: Sequela of chronic liver disease, cirrhosis of the liver      Chief Complaint/History of Present Illness: found down.   Primary Diagnoses  Present on Admission:  . Hepatic encephalopathy . Fall . Sepsis . Cellulitis . Alcohol abuse . Alcoholic cirrhosis of liver with ascites . HTN (hypertension)  Palliative Review of Systems:  completed  I have reviewed the medical record, interviewed the patient and family, and examined the patient. The following aspects are pertinent.  Past Medical History  Diagnosis Date  . Neuropathy   . Diabetes mellitus   . Bipolar affect, depressed   . Hypertension   . Arthritis   . Stroke     Mini stroke about 59yrs ago  . Cirrhosis   . Alcohol abuse   . Chronic pain   . Cocaine abuse   . Muscle spasm     both legs  . Encephalopathy, hepatic   . Detached retina   . COPD (chronic obstructive pulmonary disease)     emphysema  . Bronchitis   . Barrett's esophagus   . GERD (gastroesophageal reflux disease)     has ulcer  . Anemia    Social History   Social History  . Marital Status: Divorced    Spouse Name: N/A  . Number of Children: N/A  . Years of Education: N/A   Social History Main Topics  . Smoking status: Current Every Day Smoker -- 1.00 packs/day for 30 years    Types: Cigarettes  . Smokeless tobacco: Never Used  . Alcohol Use: 0.0 oz/week     Comment: 12 pk beer daily  06/2013 - no alcohol since 11/2012  . Drug Use: Yes    Special: Cocaine     Comment: 1 gram weekly  06/2013 - has not had any for a year  . Sexual Activity: Yes    Birth Control/ Protection: None   Other Topics  Concern  . None   Social History Narrative   Family History  Problem Relation Age of Onset  . Hypotension Mother    Scheduled Meds: . antiseptic oral rinse  7 mL Mouth Rinse BID  . Chlorhexidine Gluconate Cloth  6 each Topical Q0600  . clonazePAM  0.25 mg Oral BID  . folic acid  1 mg Oral Daily  . insulin aspart  0-9 Units Subcutaneous TID WC  . ipratropium-albuterol  3 mL Nebulization Q6H  . lactulose  30 g Oral TID  . multivitamin with minerals  1 tablet Oral Daily  . mupirocin ointment  1 application Nasal BID  . pantoprazole  40 mg Oral Daily  . piperacillin-tazobactam (ZOSYN)  IV  3.375 g Intravenous 3 times per day  . rifaximin  550 mg Oral BID  . sodium chloride  3 mL Intravenous Q12H  . thiamine  100 mg Oral Daily   Or  . thiamine  100 mg Intravenous Daily  . vancomycin  1,250 mg Intravenous Q24H   Continuous Infusions: . sodium chloride Stopped (01/17/15 1406)  . sodium chloride 50 mL/hr at 01/18/15 0830  PRN Meds:.albuterol, ondansetron **OR** ondansetron (ZOFRAN) IV Medications Prior to Admission:  Prior to Admission medications   Medication Sig Start Date End Date Taking? Authorizing Provider  amitriptyline (ELAVIL) 25 MG tablet Take 1 tablet (25 mg total) by mouth at bedtime. 10/05/14  Yes Nita Sells, MD  CALCIUM-VITAMIN D PO Take 100 Units by mouth daily.   Yes Historical Provider, MD  clonazePAM (KLONOPIN) 0.25 MG disintegrating tablet Take 1 tablet (0.25 mg total) by mouth 2 (two) times daily. 12/20/14  Yes Thurnell Lose, MD  diclofenac sodium (VOLTAREN) 1 % GEL Apply 4 g topically as needed (for back of leg pain).   Yes Historical Provider, MD  feeding supplement (BOOST / RESOURCE BREEZE) LIQD Take 1 Container by mouth 3 (three) times daily between meals. 12/20/14  Yes Thurnell Lose, MD  feeding supplement, ENSURE ENLIVE, (ENSURE ENLIVE) LIQD Take 237 mLs by mouth 2 (two) times daily between meals. 11/13/14  Yes Maryann Mikhail, DO  folic acid  (FOLVITE) 1 MG tablet Take 1 tablet (1 mg total) by mouth daily. 10/05/14  Yes Nita Sells, MD  furosemide (LASIX) 40 MG tablet Take 1 tablet (40 mg total) by mouth daily. 01/15/15  Yes Julianne Rice, MD  lactulose (CHRONULAC) 10 GM/15ML solution Take 45 mLs (30 g total) by mouth 3 (three) times daily. 12/03/14  Yes Annita Brod, MD  LANTUS SOLOSTAR 100 UNIT/ML Solostar Pen Inject 20 Units into the skin daily at 10 pm. 12/20/14  Yes Thurnell Lose, MD  magnesium oxide (MAG-OX) 400 (241.3 MG) MG tablet Take 1 tablet (400 mg total) by mouth 2 (two) times daily. 09/03/14  Yes Reyne Dumas, MD  methocarbamol (ROBAXIN) 500 MG tablet Take 2 tablets (1,000 mg total) by mouth every 8 (eight) hours as needed. Patient taking differently: Take 1,000 mg by mouth every 8 (eight) hours as needed for muscle spasms.  01/15/15  Yes Julianne Rice, MD  Multiple Vitamin (MULTIVITAMIN WITH MINERALS) TABS tablet Take 1 tablet by mouth daily. 07/31/14  Yes Jessica U Vann, DO  NOVOLOG FLEXPEN 100 UNIT/ML FlexPen Inject 6 Units into the skin 3 (three) times daily with meals. 11/24/14  Yes Historical Provider, MD  Oxycodone HCl 10 MG TABS Take 1 tablet (10 mg total) by mouth 3 (three) times daily. 01/15/15  Yes Julianne Rice, MD  pantoprazole (PROTONIX) 40 MG tablet Take 1 tablet (40 mg total) by mouth daily. 12/03/14  Yes Annita Brod, MD  propranolol (INDERAL) 20 MG tablet Take 20 mg by mouth 2 (two) times daily. 09/24/14  Yes Historical Provider, MD  spironolactone (ALDACTONE) 50 MG tablet Take 1 tablet (50 mg total) by mouth daily. 12/03/14  Yes Annita Brod, MD  thiamine (VITAMIN B-1) 100 MG tablet Take 100 mg by mouth daily.   Yes Historical Provider, MD  XIFAXAN 550 MG TABS tablet Take 550 mg by mouth 2 (two) times daily.  10/29/14  Yes Historical Provider, MD   No Known Allergies CBC:    Component Value Date/Time   WBC 4.9 01/18/2015 0350   WBC 3.7* 04/16/2012 2022   HGB 7.6* 01/18/2015 0350    HGB 10.2* 04/16/2012 2022   HCT 22.9* 01/18/2015 0350   HCT 33.4* 04/16/2012 2022   PLT 57* 01/18/2015 0350   MCV 92.0 01/18/2015 0350   MCV 88.5 04/16/2012 2022   NEUTROABS 3.3 12/16/2014 0521   LYMPHSABS 0.8 12/16/2014 0521   MONOABS 0.6 12/16/2014 0521   EOSABS 0.4 12/16/2014 0521   BASOSABS 0.0  12/16/2014 0521   Comprehensive Metabolic Panel:    Component Value Date/Time   NA 133* 01/18/2015 0350   K 5.2* 01/18/2015 0350   CL 109 01/18/2015 0350   CO2 19* 01/18/2015 0350   BUN 60* 01/18/2015 0350   CREATININE 2.03* 01/18/2015 0350   CREATININE 0.62 04/16/2012 2015   GLUCOSE 109* 01/18/2015 0350   CALCIUM 7.9* 01/18/2015 0350   AST 73* 01/18/2015 0350   ALT 37 01/18/2015 0350   ALKPHOS 119 01/18/2015 0350   BILITOT 2.9* 01/18/2015 0350   PROT 5.1* 01/18/2015 0350   ALBUMIN 2.1* 01/18/2015 0350    Physical Exam: Vital Signs: BP 133/56 mmHg  Pulse 87  Temp(Src) 98.1 F (36.7 C) (Oral)  Resp 16  Ht 5\' 11"  (1.803 m)  Wt 117.4 kg (258 lb 13.1 oz)  BMI 36.11 kg/m2  SpO2 100% SpO2: SpO2: 100 % O2 Device: O2 Device: Not Delivered O2 Flow Rate:   Intake/output summary:  Intake/Output Summary (Last 24 hours) at 01/18/15 1423 Last data filed at 01/18/15 1200  Gross per 24 hour  Intake 2592.5 ml  Output    675 ml  Net 1917.5 ml   LBM: Last BM Date: 01/18/15 Baseline Weight: Weight: 115.4 kg (254 lb 6.6 oz) Most recent weight: Weight: 117.4 kg (258 lb 13.1 oz)  Exam Findings:   weak older than stated age appearing W gentleman  Patient is tearful, states he misses his daughter scattered bruises bilateral UE and LE Awake alert Clear S1 S2 Abdomen slight distension                        Palliative Performance Scale: 20% Additional Data Reviewed: Recent Labs     01/17/15  1135  01/18/15  0350  WBC  5.3  4.9  HGB  9.3*  7.6*  PLT  60*  57*  NA  132*  133*  BUN  56*  60*  CREATININE  1.92*  2.03*     Time In: 1400 Time Out: 1455 Time Total: 55  min  Greater than 50%  of this time was spent counseling and coordinating care related to the above assessment and plan.  Signed by: Loistine Chance, MD [place], MD  01/18/2015, 2:23 PM  Please contact Palliative Medicine Team phone at [phone] for questions and concerns.

## 2015-01-18 NOTE — Plan of Care (Signed)
Problem: Phase I Progression Outcomes Goal: OOB as tolerated unless otherwise ordered Outcome: Progressing OOB to chair x2 assist for 2.5 hrs

## 2015-01-19 DIAGNOSIS — K7290 Hepatic failure, unspecified without coma: Secondary | ICD-10-CM

## 2015-01-19 DIAGNOSIS — D696 Thrombocytopenia, unspecified: Secondary | ICD-10-CM

## 2015-01-19 DIAGNOSIS — R601 Generalized edema: Secondary | ICD-10-CM

## 2015-01-19 LAB — BODY FLUID CELL COUNT WITH DIFFERENTIAL
LYMPHS FL: 59 %
MONOCYTE-MACROPHAGE-SEROUS FLUID: 38 % — AB (ref 50–90)
NEUTROPHIL FLUID: 3 % (ref 0–25)
WBC FLUID: 18 uL (ref 0–1000)

## 2015-01-19 LAB — COMPREHENSIVE METABOLIC PANEL
ALBUMIN: 2.1 g/dL — AB (ref 3.5–5.0)
ALT: 37 U/L (ref 17–63)
AST: 73 U/L — AB (ref 15–41)
Alkaline Phosphatase: 117 U/L (ref 38–126)
Anion gap: 7 (ref 5–15)
BILIRUBIN TOTAL: 2.3 mg/dL — AB (ref 0.3–1.2)
BUN: 54 mg/dL — AB (ref 6–20)
CHLORIDE: 105 mmol/L (ref 101–111)
CO2: 20 mmol/L — ABNORMAL LOW (ref 22–32)
Calcium: 7.8 mg/dL — ABNORMAL LOW (ref 8.9–10.3)
Creatinine, Ser: 1.9 mg/dL — ABNORMAL HIGH (ref 0.61–1.24)
GFR calc Af Amer: 43 mL/min — ABNORMAL LOW (ref >60)
GFR calc non Af Amer: 37 mL/min — ABNORMAL LOW (ref >60)
GLUCOSE: 147 mg/dL — AB (ref 65–99)
POTASSIUM: 4.2 mmol/L (ref 3.5–5.1)
Sodium: 132 mmol/L — ABNORMAL LOW (ref 135–145)
Total Protein: 5 g/dL — ABNORMAL LOW (ref 6.5–8.1)

## 2015-01-19 LAB — GLUCOSE, CAPILLARY
GLUCOSE-CAPILLARY: 169 mg/dL — AB (ref 65–99)
GLUCOSE-CAPILLARY: 114 mg/dL — AB (ref 65–99)
GLUCOSE-CAPILLARY: 174 mg/dL — AB (ref 65–99)
GLUCOSE-CAPILLARY: 224 mg/dL — AB (ref 65–99)
Glucose-Capillary: 156 mg/dL — ABNORMAL HIGH (ref 65–99)

## 2015-01-19 LAB — URINE CULTURE: Culture: NO GROWTH

## 2015-01-19 LAB — CBC
HEMATOCRIT: 23.4 % — AB (ref 39.0–52.0)
Hemoglobin: 7.5 g/dL — ABNORMAL LOW (ref 13.0–17.0)
MCH: 29.6 pg (ref 26.0–34.0)
MCHC: 32.1 g/dL (ref 30.0–36.0)
MCV: 92.5 fL (ref 78.0–100.0)
Platelets: 57 10*3/uL — ABNORMAL LOW (ref 150–400)
RBC: 2.53 MIL/uL — ABNORMAL LOW (ref 4.22–5.81)
RDW: 22.1 % — AB (ref 11.5–15.5)
WBC: 4.1 10*3/uL (ref 4.0–10.5)

## 2015-01-19 LAB — LACTATE DEHYDROGENASE, PLEURAL OR PERITONEAL FLUID: LD, Fluid: 23 U/L (ref 3–23)

## 2015-01-19 LAB — PROTEIN, BODY FLUID: Total protein, fluid: <3 g/dL

## 2015-01-19 LAB — PROCALCITONIN: PROCALCITONIN: 0.18 ng/mL

## 2015-01-19 LAB — AMMONIA: Ammonia: 22 umol/L (ref 9–35)

## 2015-01-19 MED ORDER — COLLAGENASE 250 UNIT/GM EX OINT
TOPICAL_OINTMENT | Freq: Every day | CUTANEOUS | Status: DC
  Administered 2015-01-19 – 2015-01-23 (×5): via TOPICAL
  Filled 2015-01-19: qty 30

## 2015-01-19 MED ORDER — ALBUMIN HUMAN 5 % IV SOLN
12.5000 g | Freq: Four times a day (QID) | INTRAVENOUS | Status: AC
  Administered 2015-01-19 (×3): 12.5 g via INTRAVENOUS
  Filled 2015-01-19 (×3): qty 250

## 2015-01-19 NOTE — Clinical Documentation Improvement (Signed)
  Internal Medicine Critical Care   "Acute on chronic stage III kidney disease" currently documented.  Can this be clarified and / or further specified?     Acute Renal Failure/Acute Kidney Injury on chronic kidney disease stage III  Acute Tubular Necrosis  Acute Renal Cortical Necrosis  Acute Renal Medullary Necrosis  Acute on Chronic Renal Failure  Chronic Renal Failure  Other  Clinically Undetermined   Please exercise your independent, professional judgment when responding. A specific answer is not anticipated or expected.   Thank you, Mateo Flow, RN [phone] Clinical Documentation Specialist

## 2015-01-19 NOTE — Procedures (Signed)
Paracentesis   Indication: ascites and dyspnea d/t ascites  Consent obtained Time out w/ RN complete  Abd imaged w/ real time [place]. Large amt of free flowing fluid identified.  Abd marked RLQ  At that point pt prepped w/ chlorahexidine, draped w/ sterile drape, and localized w/ 1% ldidocaine.   Clear straw colored peritoneal fluid was aspirated from the peritoneal space.  Following this a small incision was made. Needle advanced, clear straw colored fluid again aspirated and catheter advanced over needle.   3 liters straw colored peritoneal fluid aspirated.  Pt tolerated well  Samples sent for infection analysis No adverse events appreciated.   Erick Colace ACNP-BC [facility] Pager # [phone] OR # [phone] if no answer

## 2015-01-19 NOTE — Care Management Important Message (Signed)
Important Message  Patient Details IM   Letter given to Rhonda/ Case Manager to present to patientImportant Message  Patient Details  Name: MEKAI WILKINSON MRN: [id number] Date of Birth: 07-03-1955   Medicare Important Message Given:  Yes-second notification given    Camillo Flaming 01/19/2015, 10:51 AM Name: CHANNING YEAGER MRN: [id number] Date of Birth: May 21, 1956   Medicare Important Message Given:  Yes-second notification given    Camillo Flaming 01/19/2015, 10:50 AM

## 2015-01-19 NOTE — Progress Notes (Signed)
PULMONARY / CRITICAL CARE MEDICINE   Name: [name] MRN: [id number] DOB: 1955/08/27    ADMISSION DATE:  01/17/2015 CONSULTATION DATE:  8/27  REFERRING MD :  [facility]  CHIEF COMPLAINT: AMS  INITIAL PRESENTATION: Lethargic  HISTORY OF PRESENT ILLNESS:  [name] is a 59 yo WM with near end stage liver disease from alcoholic cirrhosis who has been admitted 8 times in the last 6 months, the last in July that required intubation. He was found (8/27)lying in his own feces and was transported to [facility] ED.    STUDIES:  [place] abd at bedside 8/27.   SIGNIFICANT EVENTS: 8/27 [facility] service requested paracentesis but due to lower ext cellulitis, small amount of fluid, tap was deferred at this time.  SUBJECTIVE:  Able to lie spine C/o abd discomfort No resp distress   VITAL SIGNS: Temp:  [97.5 F (36.4 C)-98.1 F (36.7 C)] 97.9 F (36.6 C) (08/29 0800) Pulse Rate:  [80-94] 86 (08/29 0800) Resp:  [13-20] 16 (08/29 0800) BP: (112-145)/(51-87) 145/70 mmHg (08/29 0800) SpO2:  [96 %-100 %] 96 % (08/29 0800) Weight:  [265 lb 14 oz (120.6 kg)] 265 lb 14 oz (120.6 kg) (08/29 0500) HEMODYNAMICS:   VENTILATOR SETTINGS:   INTAKE / OUTPUT:  Intake/Output Summary (Last 24 hours) at 01/19/15 0920 Last data filed at 01/19/15 0604  Gross per 24 hour  Intake   2465 ml  Output    875 ml  Net   1590 ml    PHYSICAL EXAMINATION: General:  Frail wm Neuro:  interactive, nonfocal HEENT: +jvd, dry oral mucosa Cardiovascular:  HSR RRR Lungs:  coarse Abdomen:  Distended, + fluid thrill ,non tender no r Musculoskeletal:  Lower ext huge edema and erythema Skin:  Cool and dry  LABS:  CBC  Recent Labs Lab 01/17/15 1135 01/18/15 0350 01/19/15 0404  WBC 5.3 4.9 4.1  HGB 9.3* 7.6* 7.5*  HCT 27.8* 22.9* 23.4*  PLT 60* 57* 57*   Coag's  Recent Labs Lab 01/17/15 1135 01/18/15 0350  INR 1.54* 1.92*   BMET  Recent Labs Lab 01/17/15 1135 01/18/15 0350 01/19/15 0404  NA 132* 133*  132*  K 5.2* 5.2* 4.2  CL 106 109 105  CO2 19* 19* 20*  BUN 56* 60* 54*  CREATININE 1.92* 2.03* 1.90*  GLUCOSE 131* 109* 147*   Electrolytes  Recent Labs Lab 01/17/15 1135 01/18/15 0350 01/19/15 0404  CALCIUM 8.1* 7.9* 7.8*  MG 2.1  --   --    Sepsis Markers  Recent Labs Lab 01/17/15 1322 01/17/15 1815 01/18/15 0350 01/19/15 0404  LATICACIDVEN 3.38* 3.4* 2.0  --   PROCALCITON  --  0.20 0.22 0.18   ABG No results for input(s): PHART, PCO2ART, PO2ART in the last 168 hours. Liver Enzymes  Recent Labs Lab 01/17/15 1135 01/18/15 0350 01/19/15 0404  AST 89* 73* 73*  ALT 45 37 37  ALKPHOS 155* 119 117  BILITOT 3.2* 2.9* 2.3*  ALBUMIN 2.5* 2.1* 2.1*   Cardiac Enzymes No results for input(s): TROPONINI, PROBNP in the last 168 hours. Glucose  Recent Labs Lab 01/17/15 2159 01/18/15 0803 01/18/15 1207 01/18/15 1547 01/18/15 2227 01/19/15 0810  GLUCAP 116* 96 188* 129* 169* 114*    Imaging No results found.   ASSESSMENT / PLAN:  PULMONARY A: Hx of tobacco abuse COPD  P:   O2 not required  IS  CARDIOVASCULAR CVL A:  Volume overload P:   hold diuresis tele  RENAL A:   Renal failure,  r/o ATN, r/o heptorenal, r/o intravascular depletion Hyperkalemia mild Lactic acidosis cleared P:   Would hold lasix follow crt trend Given  Albumin - rpt dose, high risk hepatorenal and urine na concerning Repeat chem in am    GASTROINTESTINAL A:   Ascites, cirrhosis GI protection P:   Possible paracentesis if sig fluid on rpt [place] Diet Dc tylenol, certainly cant handle more than 1 gram in 1 day  HEMATOLOGIC A:   Thrombocytopenia, coagulapthy - liver dz P:  Follow plts scd  INFECTIOUS A:   Cellulitis lower ext P:   BCx2 8/27>> UC  8/27>> Sputum Abx:  8/27 vanc>> 8/27 zoysn>>  Pct neg, narrow in am   ENDOCRINE A:   DM2  P:   ssi  NEUROLOGIC A:   Lethargic curently (ammonia level 5). Non compliant with meds Neg CT head Home  benzo NOT drank for 3 years P:   RASS goal: 0 Dc ativan resume home klonopin Dc ciwa   FAMILY  - Updates:   - Inter-disciplinary family meet or Palliative Care meeting due by: ongoing   Rigoberto Noel MD [phone]

## 2015-01-19 NOTE — Progress Notes (Signed)
Daily Progress Note   Patient Name: [name]       Date: 01/19/2015 DOB: 10-28-1955  Age: 59 y.o. MRN#: [id number] Attending Physician: Modena Jansky, MD Primary Care Physician: Barbette Merino, MD Admit Date: 01/17/2015  Reason for Consultation/Follow-up: Establishing goals of care  Subjective:  patient is resting in bed, states that he didn't sleep well last night. He is in no distress, possibly confused.   Interval Events:  Pulm/CCM follow up noted. Patient with no acute symptoms currently Continue to address goals of care, full code for now. Daughter trying to come here from [place]. Patient today states he has enough help and support from his girlfriend Constance Holster and his brother who also lives locally.   Length of Stay: 2 days  Current Medications: Scheduled Meds:  . albumin human  12.5 g Intravenous Q6H  . antiseptic oral rinse  7 mL Mouth Rinse BID  . Chlorhexidine Gluconate Cloth  6 each Topical Q0600  . clonazePAM  0.25 mg Oral BID  . folic acid  1 mg Oral Daily  . insulin aspart  0-9 Units Subcutaneous TID WC  . ipratropium-albuterol  3 mL Nebulization TID  . lactulose  30 g Oral TID  . multivitamin with minerals  1 tablet Oral Daily  . mupirocin ointment  1 application Nasal BID  . pantoprazole  40 mg Oral Daily  . piperacillin-tazobactam (ZOSYN)  IV  3.375 g Intravenous 3 times per day  . rifaximin  550 mg Oral BID  . sodium chloride  3 mL Intravenous Q12H  . thiamine  100 mg Oral Daily   Or  . thiamine  100 mg Intravenous Daily  . vancomycin  1,250 mg Intravenous Q24H    Continuous Infusions: . sodium chloride Stopped (01/17/15 1406)    PRN Meds: albuterol, ondansetron **OR** ondansetron (ZOFRAN) IV, oxyCODONE-acetaminophen  Palliative Performance Scale: 30%     Vital Signs: BP 145/70 mmHg  Pulse 86  Temp(Src) 97.9 F (36.6 C) (Oral)  Resp 16  Ht 5\' 11"  (1.803 m)  Wt 120.6 kg (265 lb 14 oz)  BMI 37.10 kg/m2  SpO2 98% SpO2: SpO2: 98 % O2  Device: O2 Device: Not Delivered O2 Flow Rate:    Intake/output summary:  Intake/Output Summary (Last 24 hours) at 01/19/15 1017 Last data filed at 01/19/15 0604  Gross per 24 hour  Intake   2465 ml  Output    875 ml  Net   1590 ml   LBM:   Baseline Weight: Weight: 115.4 kg (254 lb 6.6 oz) Most recent weight: Weight: 120.6 kg (265 lb 14 oz)  Physical Exam: Weak pale confused Abdomen distended Bilateral LE edema cellulitis dressing Clear S1S2           Additional Data Reviewed: Recent Labs     01/18/15  0350  01/19/15  0404  WBC  4.9  4.1  HGB  7.6*  7.5*  PLT  57*  57*  NA  133*  132*  BUN  60*  54*  CREATININE  2.03*  1.90*     Problem List:  Patient Active Problem List   Diagnosis Date Noted  . Severe muscle deconditioning   . Weakness generalized   . Encounter for palliative care   . Anasarca   . Fall 01/17/2015  . Sepsis 01/17/2015  . Cellulitis 01/17/2015  . Lactic acidosis 01/17/2015  . Protein-calorie malnutrition, severe 12/19/2014  . Pressure ulcer 12/15/2014  . Acute respiratory failure 12/14/2014  . Acute renal  failure 12/14/2014  . Chronic diastolic heart failure 60/02/9322  . Ascites   . Upper GI bleed   . Medically noncompliant 09/26/2014  . UTI (lower urinary tract infection)   . Acute blood loss anemia   . Alcoholic cirrhosis of liver without ascites   . Acute combined systolic and diastolic congestive heart failure   . AKI (acute kidney injury)   . Respiratory failure   . Unresponsive   . Hyperkalemia 07/28/2014  . Encephalopathy 07/28/2014  . Suicidal ideations   . Hx of bipolar disorder   . Alcoholic cirrhosis of liver with ascites   . PN (peripheral neuropathy)   . Retinal detachment   . MDD (major depressive disorder) 02/14/2014  . Depression, major, recurrent, moderate 02/05/2014  . Anemia 01/27/2014  . GI bleed 01/14/2014  . Altered mental status 09/13/2013  . Hypoglycemic encephalopathy 09/13/2013  . Metabolic acidosis  55/73/2202  . Acute kidney injury 09/13/2013  . Acute encephalopathy 08/28/2013  . Encephalopathy, hepatic 08/28/2013  . Pancytopenia 04/11/2013  . Falls 04/11/2013  . Hepatic encephalopathy 04/01/2013  . Tobacco abuse 03/11/2013  . Cellulitis of lower leg 12/13/2012  . Bipolar affect, depressed 12/13/2012  . Serum ammonia increased 06/20/2012  . Cirrhosis   . Thrombocytopenia 04/27/2012  . Weakness 04/27/2012  . Depression 04/27/2012  . Ulcer, stomach peptic 04/27/2012  . Acute GI bleeding 04/04/2012  . HTN (hypertension) 04/04/2012  . Diabetes mellitus 04/04/2012  . Alcohol abuse 04/08/2011    Class: Acute     Palliative Care Assessment & Plan    Code Status:  Full code  Goals of Care:   full code, continue any and all life prolonging, life maintaining measures. I have discussed with both patient and daughter over the phone (on 01-18-15). Continue to address code status and disposition arrangements, likely will need SNF   Desire for further Chaplaincy support:no  3. Symptom Management:   continue to monitor.   4. Palliative Prophylaxis:  Stool Softener: yes  5. Prognosis: weeks-months  5. Discharge Planning: pending clinical course, ?SNF   Care plan was discussed with  Patient No family present at the bedside.   Thank you for allowing the Palliative Medicine Team to assist in the care of this patient.   Time In: 0950 Time Out: 1020 Total Time 30 Prolonged Time Billed  no     Greater than 50%  of this time was spent counseling and coordinating care related to the above assessment and plan.  Karlo Goeden Burt Knack, MD  01/19/2015, 10:17 AM  [phone]  Please contact Palliative Medicine Team phone at [phone] for questions and concerns.

## 2015-01-19 NOTE — Care Management Note (Signed)
Case Management Note  Patient Details  Name: [name] MRN: [id number] Date of Birth: September 06, 1955  Subjective/Objective:           eoth withdrawal and ascites/poss pna         Action/Plan:Date:  January 19, 2015 U.R. performed for needs and level of care. Will continue to follow for Case Management needs.  Velva Harman, RN, BSN, [place]   [phone]   Expected Discharge Date:                  Expected Discharge Plan:  [place] (vs SNF)  In-House Referral:  Clinical Social Work  Discharge planning Services  CM Consult  Post Acute Care Choice:  Home Health Choice offered to:  Patient  DME Arranged:  N/A DME Agency:  NA  HH Arranged:    [place] Agency:  [place]  Status of Service:  In process, will continue to follow  Medicare Important Message Given:    Date Medicare IM Given:    Medicare IM give by:    Date Additional Medicare IM Given:    Additional Medicare Important Message give by:     If discussed at [place] of Stay Meetings, dates discussed:    Additional Comments:  Leeroy Cha, RN 01/19/2015, 10:08 AM

## 2015-01-19 NOTE — Progress Notes (Signed)
PROGRESS NOTE    [name] JZP:[id number] DOB: [birth year] DOA: 01/17/2015 PCP: Barbette Merino, MD  HPI/Brief narrative 59 y.o. male , apparently single and lives alone, PMH of alcoholic cirrhosis, portal hypertension, varices, ascites, coagulopathy, VDRF, hepatic encephalopathy, chronic diastolic CHF, type II DM, cocaine abuse, alcohol abuse, variceal GI bleed, COPD, frequent hospitalizations (8 in the last 6 months), brought to the [facility] ED on 01/17/15 after he was found on floor by family members lying in his own feces. No family members at bedside to provide history. Patient is confused and does not know that he is in the hospital and denies any complaints and hence his report is not reliable. In the ED, patient noted to be hypothermic with a rectal temperature of 95.3, wheezing-improved after nebulizations, workup significant for sodium 132, potassium 5.2, BUN 56, creatinine 1.9 to, abnormal LFTs, INR 1.5, ammonia 53, hemoglobin 9.3, platelets 60, CT head without acute abnormalities and chest x-ray suggesting interstitial infiltrates which could represent edema or infection. Patient was admitted for bilateral lower extremity cellulitis, hyponatremia, hepatic encephalopathy.    Assessment/Plan:  Bilateral leg cellulitis - Patient was hypothermic and had elevated lactate on admission but did not meet full SIRS criteria - Started empirically on IV vancomycin and Zosyn per pharmacy - Follow blood and urine culture results. Pro-calcitonin low. - Cellulitis improving. If cultures remain negative, consider narrowing antibiotic spectrum in a.m. - Blood cultures negative to date.  Anasarca/ascites - Secondary to cirrhosis, hypoalbuminemia and CHF - Due to concern for decompensated CHF, received a dose of IV Lasix 60 mg in ED. However CCM consult regarding elevated lactate and hence started him on gentle IV fluid hydration from the ED, to clear lactate. - Due to slightly worsened  creatinine and concern for hepatorenal syndrome (urine sodium <10), will hold off diuretics and continue gentle IV fluid hydration. - Eventually will need to restart Lasix +/- Aldactone based on renal function. - Paracentesis had been initially held due to concern for worsening renal function/hepatorenal syndrome. On 8/29, patient symptomatic with tense ascites and CCM performed paracentesis 3 L at bedside. - Ascitic fluid not consistent with SBP (however has been on antibiotics since admission.  Acute on chronic diastolic CHF - For now patient not dyspneic or in extremis. Monitor closely while on gentle IV fluid hydration. Hold Lasix secondary to concern for hepatorenal syndrome.  Hypothermia - Possibly from infection - TSH, cortisol and blood glucose normal.  - Resolved   Acute on stage III chronic kidney disease/non-anion gap metabolic acidosis - Baseline creatinine not sure:? 1.4-1.7. Admitted with creatinine of 1.9. - Urine sodium <10. Concern for hepatorenal syndrome - Creatinine has mildly increased to 2.03 - Hold Lasix. Brief IV fluids & IV albumin. Improving.  Mild hyperkalemia - gentle IV fluids. resolved.  Hepatic encephalopathy - CT head without acute findings - Continue lactulose and treat for sepsis - Mental status definitely improved since admission. - Lactulose has normalized.  Alcohol abuse - Patient denies recent alcohol use (states that he has not had alcohol for 3 years). blood alcohol level on admission negative - changed CIWA protocol to home dose of Klonopin. -No overt withdrawal.   Type II DM - SSI - Controlled   HTN - Controlled of meds   Alcoholic cirrhosis, portal hypertension, varices, ascites, coagulopathy, - Principal driver of his failure to thrive  Failure to thrive - Multifactorial due to significant comorbidities. Frequent hospitalizations. Palliative care input appreciated-awaiting patient's daughter to come from [place]. Marland Kitchen COPD -  Has  history of VDRF during prior hospitalization hence monitor in stepdown.  -Breathing should improve with paracentesis   Thrombocytopenia - Secondary to advanced cirrhosis.Stable   Possible falls - PT and OT evaluation. May need placement  Elevated lactate - Due to cirrhosis and acute infection. Resolved  Chronic anemia - Baseline hemoglobin probably in the 8-9 grams range. Hemoglobin stable in the 7 g range. Transfuse if hemoglobin less than 7 g per DL.    DVT prophylaxis: SCDs Code Status: Full  Family Communication: None at bedside  Disposition Plan: Keep in SDU for another 24 hours.    Consultants:  CCM  Palliative team  Procedures:  Foley catheter   Therapeutic paracentesis by CCM on 8/29:3 L removed  Antibiotics:  IV Zosyn 8/27 >  IV vancomycin 8/27 >  Subjective: Worsening abdominal distention and discomfort. Denies chest pain or dyspnea. 1 BM per nursing.   Objective: Filed Vitals:   01/19/15 1100 01/19/15 1130 01/19/15 1200 01/19/15 1426  BP: 140/57 108/53    Pulse: 96 92    Temp:   98.1 F (36.7 C)   TempSrc:   Oral   Resp: 15 14    Height:      Weight:      SpO2: 100% 99%  98%    Intake/Output Summary (Last 24 hours) at 01/19/15 1647 Last data filed at 01/19/15 0604  Gross per 24 hour  Intake    800 ml  Output    650 ml  Net    150 ml   Filed Weights   01/17/15 1800 01/18/15 0500 01/19/15 0500  Weight: 115.4 kg (254 lb 6.6 oz) 117.4 kg (258 lb 13.1 oz) 120.6 kg (265 lb 14 oz)     Exam:  General exam:  moderately built and poorly nourished chronically ill-looking male lying comfortably supine in bed. Looks much improved compared to on admission. Respiratory system: Clear anteriorly. Slightly diminished breath sounds in the bases with occasional basal crackles. Rest of lung fields clear to auscultation. No increased work of breathing. Cardiovascular system: S1 & S2 heard, RRR. No JVD, murmurs, gallops, clicks. 2+ pitting bilateral leg  edema. Gastrointestinal system: Abdomen is distended & tense, nontender. Ascites + ++. Fluid thrill +. Normal bowel sounds heard. Central nervous system: Alert and oriented to self and place . No focal neurological deficits. Extremities: Symmetric 5 x 5 power. Bilateral legs with decreasing swelling, redness and warmth.  Skin: Several superficial cuts and bruises over face, upper and lower extremities   Data Reviewed: Basic Metabolic Panel:  Recent Labs Lab 01/14/15 2258 01/17/15 1135 01/18/15 0350 01/19/15 0404  NA 130* 132* 133* 132*  K 4.9 5.2* 5.2* 4.2  CL 103 106 109 105  CO2 18* 19* 19* 20*  GLUCOSE 127* 131* 109* 147*  BUN 57* 56* 60* 54*  CREATININE 2.11* 1.92* 2.03* 1.90*  CALCIUM 8.3* 8.1* 7.9* 7.8*  MG  --  2.1  --   --    Liver Function Tests:  Recent Labs Lab 01/15/15 0010 01/17/15 1135 01/18/15 0350 01/19/15 0404  AST 85* 89* 73* 73*  ALT 42 45 37 37  ALKPHOS 141* 155* 119 117  BILITOT 2.7* 3.2* 2.9* 2.3*  PROT 6.0* 6.1* 5.1* 5.0*  ALBUMIN 2.3* 2.5* 2.1* 2.1*   No results for input(s): LIPASE, AMYLASE in the last 168 hours.  Recent Labs Lab 01/17/15 1130 01/19/15 0404  AMMONIA 53* 22   CBC:  Recent Labs Lab 01/14/15 2258 01/17/15 1135 01/18/15 0350 01/19/15  0404  WBC 5.8 5.3 4.9 4.1  HGB 8.5* 9.3* 7.6* 7.5*  HCT 25.6* 27.8* 22.9* 23.4*  MCV 91.8 91.7 92.0 92.5  PLT 48* 60* 57* 57*   Cardiac Enzymes:  Recent Labs Lab 01/17/15 1130  CKTOTAL 170   BNP (last 3 results)  Recent Labs  02/08/14 1830 02/10/14 1547  PROBNP 966.3* 805.5*   CBG:  Recent Labs Lab 01/18/15 1207 01/18/15 1547 01/18/15 2227 01/19/15 0810 01/19/15 1133  GLUCAP 188* 129* 169* 114* 174*    Recent Results (from the past 240 hour(s))  Culture, Urine     Status: None   Collection Time: 01/17/15 12:30 PM  Result Value Ref Range Status   Specimen Description URINE, CATHETERIZED  Final   Special Requests NONE  Final   Culture   Final    NO GROWTH  1 DAY Performed at [facility]    Report Status 01/19/2015 FINAL  Final  MRSA PCR Screening     Status: Abnormal   Collection Time: 01/17/15  4:50 PM  Result Value Ref Range Status   MRSA by PCR POSITIVE (A) NEGATIVE Final    Comment:        The GeneXpert MRSA Assay (FDA approved for NASAL specimens only), is one component of a comprehensive MRSA colonization surveillance program. It is not intended to diagnose MRSA infection nor to guide or monitor treatment for MRSA infections. RESULT CALLED TO, READ BACK BY AND VERIFIED WITH: G.GARLAND,RN AT 2040 ON 01/17/15 BY W.SHEA   Culture, blood (routine x 2)     Status: None (Preliminary result)   Collection Time: 01/17/15  6:15 PM  Result Value Ref Range Status   Specimen Description BLOOD RIGHT ARM  Final   Special Requests BOTTLES DRAWN AEROBIC ONLY [place]  Final   Culture   Final    NO GROWTH 1 DAY Performed at [facility]    Report Status PENDING  Incomplete  Culture, blood (routine x 2)     Status: None (Preliminary result)   Collection Time: 01/17/15  6:20 PM  Result Value Ref Range Status   Specimen Description BLOOD LEFT HAND  Final   Special Requests BOTTLES DRAWN AEROBIC ONLY 10CC  Final   Culture   Final    NO GROWTH 1 DAY Performed at [facility]    Report Status PENDING  Incomplete  Body fluid culture     Status: None (Preliminary result)   Collection Time: 01/19/15 11:06 AM  Result Value Ref Range Status   Specimen Description PERITONEAL  Final   Special Requests NONE  Final   Gram Stain   Final    FEW WBC PRESENT,BOTH PMN AND MONONUCLEAR NO ORGANISMS SEEN Performed at [facility]    Culture PENDING  Incomplete   Report Status PENDING  Incomplete         Studies: No results found.      Scheduled Meds: . albumin human  12.5 g Intravenous Q6H  . antiseptic oral rinse  7 mL Mouth Rinse BID  . Chlorhexidine Gluconate Cloth  6 each Topical Q0600  . clonazePAM  0.25  mg Oral BID  . collagenase   Topical Daily  . folic acid  1 mg Oral Daily  . insulin aspart  0-9 Units Subcutaneous TID WC  . ipratropium-albuterol  3 mL Nebulization TID  . lactulose  30 g Oral TID  . multivitamin with minerals  1 tablet Oral Daily  . mupirocin ointment  1 application  Nasal BID  . pantoprazole  40 mg Oral Daily  . piperacillin-tazobactam (ZOSYN)  IV  3.375 g Intravenous 3 times per day  . rifaximin  550 mg Oral BID  . sodium chloride  3 mL Intravenous Q12H  . thiamine  100 mg Oral Daily   Or  . thiamine  100 mg Intravenous Daily  . vancomycin  1,250 mg Intravenous Q24H   Continuous Infusions: . sodium chloride Stopped (01/17/15 1406)    Principal Problem:   Sepsis Active Problems:   Alcohol abuse   HTN (hypertension)   Hepatic encephalopathy   Alcoholic cirrhosis of liver with ascites   Fall   Cellulitis   Lactic acidosis   Severe muscle deconditioning   Weakness generalized   Encounter for palliative care   Anasarca    Time spent: 45 minutes.    Vernell Leep, MD, FACP, FHM. [facility] Pager [phone]  If 7PM-7AM, please contact night-coverage [email or web address] Password [facility] 01/19/2015, 4:47 PM    LOS: 2 days

## 2015-01-19 NOTE — Evaluation (Signed)
Occupational Therapy Evaluation Patient Details Name: [name] MRN: [id number] DOB: 1956-03-21 Today's Date: 01/19/2015    History of Present Illness Pt admitted by ambulance after being found on floor at home.  Pt dx sepsis with hx of DM, CVA, ETOH abuse with cirrhosis, COPD, bipolar and neuropathy   Clinical Impression   Pt admitted with sepsis. Pt currently with functional limitations due to the deficits listed below (see OT Problem List).  Pt will benefit from skilled OT to increase their safety and independence with ADL and functional mobility for ADL to facilitate discharge to venue listed below.      Follow Up Recommendations  SNF    Equipment Recommendations  Other (comment) (TBD)    Recommendations for Other Services       Precautions / Restrictions Precautions Precautions: Fall Restrictions Weight Bearing Restrictions: No      Mobility Bed Mobility      did not perform            Transfers                           ADL Overall ADL's : Needs assistance/impaired                                       General ADL Comments: pt declined OOB but agree to BUE exercise and positioning               Pertinent Vitals/Pain Pain Assessment: No/denies pain     Hand Dominance Right   Extremity/Trunk Assessment Upper Extremity Assessment Upper Extremity Assessment: Generalized weakness (significant edema lower arm area of BUE)           Communication Communication Communication: No difficulties   Cognition Arousal/Alertness: Awake/alert Behavior During Therapy: WFL for tasks assessed/performed Overall Cognitive Status: Within Functional Limits for tasks assessed                                Home Living Family/patient expects to be discharged to:: Private residence Living Arrangements: Alone Available Help at Discharge: Family;Friend(s);Home health;Other (Comment) (unclear if 24/7 available) Type  of Home: Apartment Home Access: Level entry     Home Layout: One level     Bathroom Shower/Tub: Tub/shower unit;Curtain   Bathroom Toilet: Standard Bathroom Accessibility: Yes   Home Equipment: Walker - 2 wheels;Bedside commode;Tub bench;Hand held shower head   Additional Comments: Pt states he lives alone and his girlfriend helps him out but does not live there      Prior Functioning/Environment Level of Independence: Needs assistance  Gait / Transfers Assistance Needed: Uses RW vs cane (cane for house hold mobility and RW for community mobility) ADL's / Homemaking Assistance Needed: Pt reports that his girlfriend, Constance Holster, assists him with bathing. He uses bench and BSC beside bed)   Comments: h/o falls    OT Diagnosis: Generalized weakness   OT Problem List: Decreased strength;Decreased activity tolerance;Increased edema   OT Treatment/Interventions: Self-care/ADL training;DME and/or AE instruction;Patient/family education;Therapeutic exercise    OT Goals(Current goals can be found in the care plan section) Acute Rehab OT Goals Patient Stated Goal: HOME OT Goal Formulation: With patient Time For Goal Achievement: 02/26/15  OT Frequency: Min 2X/week   Barriers to D/C:  Co-evaluation              End of Session    Activity Tolerance: Patient limited by fatigue Patient left: in bed   Time: 1510-1520 OT Time Calculation (min): 10 min Charges:  OT General Charges $OT Visit: 1 Procedure OT Evaluation $Initial OT Evaluation Tier I: 1 Procedure G-Codes:    Betsy Pries 02/05/15, 3:32 PM

## 2015-01-19 NOTE — Consult Note (Signed)
WOC wound consult note Reason for Consult:Multiple brusies and scrapes due to fall Wound type:Traumatic Pressure Ulcer POA: No Measurement:RIght knee:  3cm x 4cm with indeterminate depth due to the presence of necrotic tissue (full thickness) with 75% black soft eschar present and 25% pink tissue. Small amount of exudate (serous).  Right hand, left hand and right forearm with full thickness and partial thickness tissue loss the largest of which measures 4cm x 0.4cm x 0.2cm. No drainage from these areas. All wound beds are clean, pink and moist. Wound WHQ:[id number] above. Drainage (amount, consistency, odor) As described above. Periwound:Intact, dry with evidence of previous injuries (scabs or scars) Dressing procedure/placement/frequency: I have today implemented conservative therapies to the upper extremities (white petrolatum gauze) for its atraumatic properties and ability to promote moist wound healing.  The right knee will require some enzymatic debriding agent application, so I have ordered collagenase (Santy) to be applied daily. Thank you for this consult.  The [place] team will not follow routinely, but will remain available to this patient, the nursing and medical teams. Please re-consult if needed. Thanks, Maudie Flakes, MSN, RN, [place], [place], [place] [phone])

## 2015-01-20 ENCOUNTER — Inpatient Hospital Stay (HOSPITAL_COMMUNITY): Payer: Medicare PPO

## 2015-01-20 LAB — COMPREHENSIVE METABOLIC PANEL
ALBUMIN: 2.4 g/dL — AB (ref 3.5–5.0)
ALK PHOS: 123 U/L (ref 38–126)
ALT: 36 U/L (ref 17–63)
ANION GAP: 7 (ref 5–15)
AST: 64 U/L — AB (ref 15–41)
BILIRUBIN TOTAL: 1.9 mg/dL — AB (ref 0.3–1.2)
BUN: 50 mg/dL — AB (ref 6–20)
CALCIUM: 7.8 mg/dL — AB (ref 8.9–10.3)
CO2: 20 mmol/L — AB (ref 22–32)
Chloride: 106 mmol/L (ref 101–111)
Creatinine, Ser: 1.76 mg/dL — ABNORMAL HIGH (ref 0.61–1.24)
GFR calc Af Amer: 47 mL/min — ABNORMAL LOW (ref >60)
GFR calc non Af Amer: 41 mL/min — ABNORMAL LOW (ref >60)
GLUCOSE: 146 mg/dL — AB (ref 65–99)
Potassium: 4.1 mmol/L (ref 3.5–5.1)
SODIUM: 133 mmol/L — AB (ref 135–145)
TOTAL PROTEIN: 4.9 g/dL — AB (ref 6.5–8.1)

## 2015-01-20 LAB — GLUCOSE, CAPILLARY
GLUCOSE-CAPILLARY: 151 mg/dL — AB (ref 65–99)
GLUCOSE-CAPILLARY: 206 mg/dL — AB (ref 65–99)
Glucose-Capillary: 167 mg/dL — ABNORMAL HIGH (ref 65–99)

## 2015-01-20 LAB — CBC
HEMATOCRIT: 21.8 % — AB (ref 39.0–52.0)
HEMOGLOBIN: 7.2 g/dL — AB (ref 13.0–17.0)
MCH: 30.9 pg (ref 26.0–34.0)
MCHC: 33 g/dL (ref 30.0–36.0)
MCV: 93.6 fL (ref 78.0–100.0)
Platelets: 52 10*3/uL — ABNORMAL LOW (ref 150–400)
RBC: 2.33 MIL/uL — AB (ref 4.22–5.81)
RDW: 22.1 % — ABNORMAL HIGH (ref 11.5–15.5)
WBC: 3.6 10*3/uL — AB (ref 4.0–10.5)

## 2015-01-20 MED ORDER — OXYCODONE HCL 5 MG PO TABS
5.0000 mg | ORAL_TABLET | Freq: Four times a day (QID) | ORAL | Status: DC | PRN
Start: 2015-01-20 — End: 2015-01-23
  Administered 2015-01-20 – 2015-01-22 (×7): 10 mg via ORAL
  Administered 2015-01-22: 5 mg via ORAL
  Administered 2015-01-23: 10 mg via ORAL
  Filled 2015-01-20 (×9): qty 2

## 2015-01-20 MED ORDER — FUROSEMIDE 40 MG PO TABS
40.0000 mg | ORAL_TABLET | Freq: Every day | ORAL | Status: DC
  Administered 2015-01-20 – 2015-01-23 (×4): 40 mg via ORAL
  Filled 2015-01-20 (×4): qty 1

## 2015-01-20 MED ORDER — DOXYCYCLINE HYCLATE 100 MG PO TABS
100.0000 mg | ORAL_TABLET | Freq: Two times a day (BID) | ORAL | Status: DC
  Administered 2015-01-20 – 2015-01-23 (×7): 100 mg via ORAL
  Filled 2015-01-20 (×8): qty 1

## 2015-01-20 NOTE — Progress Notes (Signed)
PT Cancellation Note  Patient Details Name: [name] MRN: [id number] DOB: [birth year]   Cancelled Treatment:     Paracentesis then room change.  Will tre attempt tomorrow.   Nathanial Rancher 01/20/2015, 3:26 PM

## 2015-01-20 NOTE — Progress Notes (Addendum)
PROGRESS NOTE    [name] BJY:[id number] DOB: 1955-06-23 DOA: 01/17/2015 PCP: Barbette Merino, MD  HPI/Brief narrative 59 y.o. male , apparently single and lives alone, PMH of alcoholic cirrhosis, portal hypertension, varices, ascites, coagulopathy, VDRF, hepatic encephalopathy, chronic diastolic CHF, type II DM, cocaine abuse, alcohol abuse, variceal GI bleed, COPD, frequent hospitalizations (8 in the last 6 months), brought to the [facility] ED on 01/17/15 after he was found on floor by family members lying in his own feces. In the ED, patient noted to be hypothermic with a rectal temperature of 95.3, wheezing-improved after nebulizations, workup significant for sodium 132, potassium 5.2, BUN 56, creatinine 1.9 to, abnormal LFTs, INR 1.5, ammonia 53, hemoglobin 9.3, platelets 60, CT head without acute abnormalities and chest x-ray suggesting interstitial infiltrates which could represent edema or infection. Patient was admitted to stepdown unit for bilateral lower extremity cellulitis, hyponatremia, hepatic encephalopathy. Cellulitis and acute on chronic kidney disease improving. CCM consulted and patient underwent 3 L therapeutic paracentesis on 8/29 and we'll repeat paracentesis 8/30. Ascitic fluid transudate of and no features of SBP. Transfer to floor 8/30. Palliative care on board for goals of care-awaiting daughter arrival from [place]. Will need SNF at DC.    Assessment/Plan:  Bilateral leg cellulitis - Patient was hypothermic and had elevated lactate on admission but did not meet full SIRS criteria - Initially treated empirically with IV vancomycin and Zosyn. Blood cultures 2: Negative to date. - Improved/cellulitis features almost resolved. We'll transition to oral doxycycline to complete total of 7 days antibiotics. Resume Lasix on 8/34 anasarca which should also help with pedal edema.  Anasarca/ascites - Secondary to cirrhosis, hypoalbuminemia and CHF - Paracentesis  initially held on admission due to concern for hepatorenal syndrome and risk for worsening. Patient was already on antibiotics for cellulitis which would have covered for SBP - After IV albumin infusion, underwent paracentesis 3 L by CCM on 8/29-fluid transudative and not indicative of SBP. We'll repeat paracentesis 8/30 due to symptoms. Plan to resume Lasix rather than Aldactone due to concern for hyperkalemia.  Acute on chronic diastolic CHF - Resume Lasix 8/30  Hypothermia - Possibly from infection - TSH, cortisol and blood glucose normal.  - Resolved   Acute on stage III chronic kidney disease/non-anion gap metabolic acidosis - Baseline creatinine not sure:? 1.4-1.7. Admitted with creatinine of 1.9. - Urine sodium <10. Concern for hepatorenal syndrome - Although he received a dose of IV Lasix in ED, since then diuretics were held and patient was briefly hydrated with IV fluids. Creatinine has gradually improved. Monitor BMP closely.  Mild hyperkalemia - gentle IV fluids. resolved.  Hepatic encephalopathy - CT head without acute findings - Continue lactulose and treat for sepsis - Mental status probably back to baseline. - Ammonia normalized.  Alcohol abuse - Patient denies recent alcohol use (states that he has not had alcohol for 3 years). blood alcohol level on admission negative - changed CIWA protocol to home dose of Klonopin. -No overt withdrawal.   Type II DM - SSI - Controlled   HTN - Controlled of meds   Alcoholic cirrhosis, portal hypertension, varices, ascites, coagulopathy, - Principal driver of his failure to thrive - Patient states that he has been free from alcohol use for last 3 years. Need to consider a referral for liver transplant as outpatient - defer to PCP follow up.  Failure to thrive - Multifactorial due to significant comorbidities. Frequent hospitalizations. Palliative care input appreciated-awaiting patient's daughter to come from  [place]. Marland Kitchen COPD - stable - Breathing should improve with paracentesis   Thrombocytopenia - Secondary to advanced cirrhosis.Stable   Possible falls - PT and OT evaluation. SNF at DC  Elevated lactate - Due to cirrhosis and acute infection. Resolved  Chronic anemia/pancytopenia - Baseline hemoglobin probably in the 8-9 grams range. Hemoglobin gradually dropping. Transfuse if hemoglobin less than 7 g per DL.  Pleuritic/muscular chest pain - Patient complains of lower mid anterior chest pain, worse with coughing or with deep breath. Agree with checking chest x-ray.    DVT prophylaxis: SCDs Code Status: Full  Family Communication: None at bedside  Disposition Plan: Transfer to medical bed 8/30. DC to SNF in 2-3 days.    Consultants:  CCM  Palliative team  Procedures:  Foley catheter   Therapeutic paracentesis by CCM on 8/29:3 L removed  Antibiotics:  IV Zosyn 8/27 > 8/30  IV vancomycin 8/27 > 8/30  Oral doxycycline 8/30 >  Subjective: Anterior chest pain-worse with coughing or deep breath.  Objective: Filed Vitals:   01/20/15 0515 01/20/15 0558 01/20/15 0808 01/20/15 0840  BP:   145/71   Pulse:   98   Temp:    97.7 F (36.5 C)  TempSrc:    Oral  Resp:   19   Height:      Weight: 119.8 kg (264 lb 1.8 oz)     SpO2:  98% 99% 97%    Intake/Output Summary (Last 24 hours) at 01/20/15 1149 Last data filed at 01/20/15 0600  Gross per 24 hour  Intake    880 ml  Output   1150 ml  Net   -270 ml   Filed Weights   01/18/15 0500 01/19/15 0500 01/20/15 0515  Weight: 117.4 kg (258 lb 13.1 oz) 120.6 kg (265 lb 14 oz) 119.8 kg (264 lb 1.8 oz)     Exam:  General exam:  moderately built and poorly nourished chronically ill-looking male lying comfortably supine in bed. Respiratory system: Clear anteriorly. Slightly diminished breath sounds in the bases with occasional basal crackles. Rest of lung fields clear to auscultation. No increased work of breathing. No  reproducible chest pain. Cardiovascular system: S1 & S2 heard, RRR. No JVD, murmurs, gallops, clicks. 2+ pitting bilateral leg edema. Tele: SR Gastrointestinal system: Abdomen is distended & tense, nontender. Ascites + ++. Fluid thrill +. Normal bowel sounds heard. Abdominal distention may be slightly better than yesterday but still significant. Central nervous system: Alert and oriented 3 . No focal neurological deficits. Extremities: Symmetric 5 x 5 power. Bilateral legs with decreasing swelling. No redness, warmth or tenderness..  Skin: Several superficial cuts and bruises over face, upper and lower extremities   Data Reviewed: Basic Metabolic Panel:  Recent Labs Lab 01/14/15 2258 01/17/15 1135 01/18/15 0350 01/19/15 0404 01/20/15 0330  NA 130* 132* 133* 132* 133*  K 4.9 5.2* 5.2* 4.2 4.1  CL 103 106 109 105 106  CO2 18* 19* 19* 20* 20*  GLUCOSE 127* 131* 109* 147* 146*  BUN 57* 56* 60* 54* 50*  CREATININE 2.11* 1.92* 2.03* 1.90* 1.76*  CALCIUM 8.3* 8.1* 7.9* 7.8* 7.8*  MG  --  2.1  --   --   --    Liver Function Tests:  Recent Labs Lab 01/15/15 0010 01/17/15 1135 01/18/15 0350 01/19/15 0404 01/20/15 0330  AST 85* 89* 73* 73* 64*  ALT 42 45 37 37 36  ALKPHOS 141* 155* 119 117 123  BILITOT 2.7* 3.2* 2.9* 2.3* 1.9*  PROT  6.0* 6.1* 5.1* 5.0* 4.9*  ALBUMIN 2.3* 2.5* 2.1* 2.1* 2.4*   No results for input(s): LIPASE, AMYLASE in the last 168 hours.  Recent Labs Lab 01/17/15 1130 01/19/15 0404  AMMONIA 53* 22   CBC:  Recent Labs Lab 01/14/15 2258 01/17/15 1135 01/18/15 0350 01/19/15 0404 01/20/15 0330  WBC 5.8 5.3 4.9 4.1 3.6*  HGB 8.5* 9.3* 7.6* 7.5* 7.2*  HCT 25.6* 27.8* 22.9* 23.4* 21.8*  MCV 91.8 91.7 92.0 92.5 93.6  PLT 48* 60* 57* 57* 52*   Cardiac Enzymes:  Recent Labs Lab 01/17/15 1130  CKTOTAL 170   BNP (last 3 results)  Recent Labs  02/08/14 1830 02/10/14 1547  PROBNP 966.3* 805.5*   CBG:  Recent Labs Lab 01/19/15 0810  01/19/15 1133 01/19/15 1630 01/19/15 2149 01/20/15 0831  GLUCAP 114* 174* 224* 156* 167*    Recent Results (from the past 240 hour(s))  Culture, Urine     Status: None   Collection Time: 01/17/15 12:30 PM  Result Value Ref Range Status   Specimen Description URINE, CATHETERIZED  Final   Special Requests NONE  Final   Culture   Final    NO GROWTH 1 DAY Performed at [facility]    Report Status 01/19/2015 FINAL  Final  MRSA PCR Screening     Status: Abnormal   Collection Time: 01/17/15  4:50 PM  Result Value Ref Range Status   MRSA by PCR POSITIVE (A) NEGATIVE Final    Comment:        The GeneXpert MRSA Assay (FDA approved for NASAL specimens only), is one component of a comprehensive MRSA colonization surveillance program. It is not intended to diagnose MRSA infection nor to guide or monitor treatment for MRSA infections. RESULT CALLED TO, READ BACK BY AND VERIFIED WITH: G.GARLAND,RN AT 2040 ON 01/17/15 BY W.SHEA   Culture, blood (routine x 2)     Status: None (Preliminary result)   Collection Time: 01/17/15  6:15 PM  Result Value Ref Range Status   Specimen Description BLOOD RIGHT ARM  Final   Special Requests BOTTLES DRAWN AEROBIC ONLY [place]  Final   Culture   Final    NO GROWTH 1 DAY Performed at [facility]    Report Status PENDING  Incomplete  Culture, blood (routine x 2)     Status: None (Preliminary result)   Collection Time: 01/17/15  6:20 PM  Result Value Ref Range Status   Specimen Description BLOOD LEFT HAND  Final   Special Requests BOTTLES DRAWN AEROBIC ONLY 10CC  Final   Culture   Final    NO GROWTH 1 DAY Performed at [facility]    Report Status PENDING  Incomplete  Body fluid culture     Status: None (Preliminary result)   Collection Time: 01/19/15 11:06 AM  Result Value Ref Range Status   Specimen Description PERITONEAL  Final   Special Requests NONE  Final   Gram Stain   Final    FEW WBC PRESENT,BOTH PMN AND  MONONUCLEAR NO ORGANISMS SEEN    Culture   Final    NO GROWTH < 24 HOURS Performed at [facility]    Report Status PENDING  Incomplete         Studies: No results found.      Scheduled Meds: . antiseptic oral rinse  7 mL Mouth Rinse BID  . Chlorhexidine Gluconate Cloth  6 each Topical Q0600  . clonazePAM  0.25 mg Oral BID  .  collagenase   Topical Daily  . doxycycline  100 mg Oral BID  . folic acid  1 mg Oral Daily  . furosemide  40 mg Oral Daily  . insulin aspart  0-9 Units Subcutaneous TID WC  . ipratropium-albuterol  3 mL Nebulization TID  . lactulose  30 g Oral TID  . multivitamin with minerals  1 tablet Oral Daily  . mupirocin ointment  1 application Nasal BID  . pantoprazole  40 mg Oral Daily  . rifaximin  550 mg Oral BID  . sodium chloride  3 mL Intravenous Q12H  . thiamine  100 mg Oral Daily   Continuous Infusions: . sodium chloride 10 mL/hr at 01/20/15 0600    Principal Problem:   Sepsis Active Problems:   Alcohol abuse   HTN (hypertension)   Hepatic encephalopathy   Alcoholic cirrhosis of liver with ascites   Fall   Cellulitis   Lactic acidosis   Severe muscle deconditioning   Weakness generalized   Encounter for palliative care   Anasarca    Time spent: 25 minutes.    Vernell Leep, MD, FACP, FHM. [facility] Pager [phone]  If 7PM-7AM, please contact night-coverage [email or web address] Password [email or web address] 01/20/2015, 11:49 AM    LOS: 3 days

## 2015-01-20 NOTE — Progress Notes (Signed)
Clinical Social Work  CSW received referral to assist with SNF placement. Per chart review, PT recommends HH. CSW is signing off but available if further needs arise.  [employer], [place] [phone]

## 2015-01-20 NOTE — Progress Notes (Signed)
PULMONARY / CRITICAL CARE MEDICINE   Name: [name] MRN: [id number] DOB: June 27, 1955    ADMISSION DATE:  01/17/2015 CONSULTATION DATE:  8/27  REFERRING MD :  [facility]  CHIEF COMPLAINT: AMS  INITIAL PRESENTATION: Lethargic  HISTORY OF PRESENT ILLNESS:  [name] is a 59 yo WM with near end stage liver disease from alcoholic cirrhosis who has been admitted 8 times in the last 6 months, the last in July that required intubation. He was found (8/27)lying in his own feces and was transported to [facility] ED.    STUDIES:  [place] abd at bedside 8/27.   SIGNIFICANT EVENTS: 8/27 [facility] service requested paracentesis but due to lower ext cellulitis, small amount of fluid, tap was deferred at this time. 8/29 3 liters off via paracentesis. Transudate sample  SUBJECTIVE:   abd site leaked from para. Still w/ some pleuritic type discomfort on left chest. Spoke w/ primary team re: plan of care this am   VITAL SIGNS: Temp:  [97.9 F (36.6 C)-98.2 F (36.8 C)] 98.1 F (36.7 C) (08/30 0400) Pulse Rate:  [83-97] 83 (08/30 0400) Resp:  [12-17] 13 (08/30 0400) BP: (108-140)/(51-91) 121/60 mmHg (08/30 0400) SpO2:  [96 %-100 %] 97 % (08/30 0840) Weight:  [119.8 kg (264 lb 1.8 oz)] 119.8 kg (264 lb 1.8 oz) (08/30 0515) Room air   HEMODYNAMICS:   VENTILATOR SETTINGS:   INTAKE / OUTPUT:  Intake/Output Summary (Last 24 hours) at 01/20/15 0913 Last data filed at 01/20/15 0600  Gross per 24 hour  Intake   1130 ml  Output   1150 ml  Net    -20 ml    PHYSICAL EXAMINATION: General:  Frail wm Neuro:  interactive, nonfocal, oriented  HEENT: jvd, dry mmm now  Cardiovascular:  HSR RRR Lungs:  Coarse, decreased in bases  Abdomen:  Distended, + fluid thrill ,non tender no r. RLQ dressing intact  Musculoskeletal:  Lower ext huge edema ->persists, and erythema improved  Skin:  Cool and dry  LABS:  CBC  Recent Labs Lab 01/18/15 0350 01/19/15 0404 01/20/15 0330  WBC 4.9 4.1 3.6*  HGB 7.6*  7.5* 7.2*  HCT 22.9* 23.4* 21.8*  PLT 57* 57* 52*   Coag's  Recent Labs Lab 01/17/15 1135 01/18/15 0350  INR 1.54* 1.92*   BMET  Recent Labs Lab 01/18/15 0350 01/19/15 0404 01/20/15 0330  NA 133* 132* 133*  K 5.2* 4.2 4.1  CL 109 105 106  CO2 19* 20* 20*  BUN 60* 54* 50*  CREATININE 2.03* 1.90* 1.76*  GLUCOSE 109* 147* 146*   Electrolytes  Recent Labs Lab 01/17/15 1135 01/18/15 0350 01/19/15 0404 01/20/15 0330  CALCIUM 8.1* 7.9* 7.8* 7.8*  MG 2.1  --   --   --    Sepsis Markers  Recent Labs Lab 01/17/15 1322 01/17/15 1815 01/18/15 0350 01/19/15 0404  LATICACIDVEN 3.38* 3.4* 2.0  --   PROCALCITON  --  0.20 0.22 0.18   ABG No results for input(s): PHART, PCO2ART, PO2ART in the last 168 hours. Liver Enzymes  Recent Labs Lab 01/18/15 0350 01/19/15 0404 01/20/15 0330  AST 73* 73* 64*  ALT 37 37 36  ALKPHOS 119 117 123  BILITOT 2.9* 2.3* 1.9*  ALBUMIN 2.1* 2.1* 2.4*   Cardiac Enzymes No results for input(s): TROPONINI, PROBNP in the last 168 hours. Glucose  Recent Labs Lab 01/18/15 1547 01/18/15 2227 01/19/15 0810 01/19/15 1133 01/19/15 1630 01/19/15 2149  GLUCAP 129* 169* 114* 174* 224* 156*  Imaging No results found.   ASSESSMENT / PLAN:  PULMONARY A: Hx of tobacco abuse COPD Left sided pleuritic CP  O2 not required  P: Repeat CXR  IS  CARDIOVASCULAR CVL A:  Volume overload P:   resume diuresis tele  RENAL A:   Renal failure, r/o ATN, r/o heptorenal, r/o intravascular depletion-->improved  Hyperkalemia mild Lactic acidosis cleared P:   Resume lasix Repeat chem in am    GASTROINTESTINAL A:   Ascites, cirrhosis GI protection P:   Repeat paracentesis today  Diet Dc tylenol, certainly cant handle more than 1 gram in 1 day  HEMATOLOGIC A:   Thrombocytopenia, coagulapthy - liver dz P:  Follow plts scd  INFECTIOUS A:   Cellulitis lower ext P:   BCx2 8/27>> UC  8/27>> Sputum Abx:  8/27  vanc>>8/30 8/27 zoysn>>8/30 Doxy 8/30>>>  ENDOCRINE A:   DM2  P:   ssi  NEUROLOGIC A:   Lethargic curently (ammonia level 5). Non compliant with meds Neg CT head Home benzo NOT drank for 3 years P:   RASS goal: 0 Dc ativan resumed home klonopin   FAMILY  - Updates:   - Inter-disciplinary family meet or Palliative Care meeting due by: ongoing  Repeat para today to improve dyspnea and abd distention. Agree w/ narrowing abx and adding back lasix. Ready for xfer to floor.   Erick Colace ACNP-BC [place] Pager # [phone] OR # [phone] if no answer

## 2015-01-20 NOTE — Procedures (Signed)
Paracentesis  Informed consent obtained Time out w/ RN Pt prepped w/ chlorahex  Peritoneal fluid identified via real time [place] and site marked Pt localized w/ 1% lidocaine Small incision made Cath placed over needle w/ clear pink tinged peritoneal fluid aspirated  Total 3 liters removed Pt tolerated well Occlusive dressing placed.   Erick Colace ACNP-BC [place] Pager # [phone] OR # [phone] if no answer

## 2015-01-21 DIAGNOSIS — L03119 Cellulitis of unspecified part of limb: Secondary | ICD-10-CM

## 2015-01-21 LAB — COMPREHENSIVE METABOLIC PANEL
ALK PHOS: 128 U/L — AB (ref 38–126)
ALT: 37 U/L (ref 17–63)
AST: 68 U/L — AB (ref 15–41)
Albumin: 2.4 g/dL — ABNORMAL LOW (ref 3.5–5.0)
Anion gap: 7 (ref 5–15)
BUN: 45 mg/dL — AB (ref 6–20)
CALCIUM: 7.6 mg/dL — AB (ref 8.9–10.3)
CHLORIDE: 105 mmol/L (ref 101–111)
CO2: 19 mmol/L — AB (ref 22–32)
CREATININE: 1.64 mg/dL — AB (ref 0.61–1.24)
GFR calc Af Amer: 51 mL/min — ABNORMAL LOW (ref >60)
GFR calc non Af Amer: 44 mL/min — ABNORMAL LOW (ref >60)
Glucose, Bld: 141 mg/dL — ABNORMAL HIGH (ref 65–99)
Potassium: 3.9 mmol/L (ref 3.5–5.1)
SODIUM: 131 mmol/L — AB (ref 135–145)
Total Bilirubin: 2.3 mg/dL — ABNORMAL HIGH (ref 0.3–1.2)
Total Protein: 5.2 g/dL — ABNORMAL LOW (ref 6.5–8.1)

## 2015-01-21 LAB — CBC
HCT: 25.2 % — ABNORMAL LOW (ref 39.0–52.0)
HEMOGLOBIN: 8.5 g/dL — AB (ref 13.0–17.0)
MCH: 31.3 pg (ref 26.0–34.0)
MCHC: 33.7 g/dL (ref 30.0–36.0)
MCV: 92.6 fL (ref 78.0–100.0)
PLATELETS: 59 10*3/uL — AB (ref 150–400)
RBC: 2.72 MIL/uL — AB (ref 4.22–5.81)
RDW: 22 % — ABNORMAL HIGH (ref 11.5–15.5)
WBC: 5.1 10*3/uL (ref 4.0–10.5)

## 2015-01-21 LAB — GLUCOSE, CAPILLARY
GLUCOSE-CAPILLARY: 162 mg/dL — AB (ref 65–99)
GLUCOSE-CAPILLARY: 167 mg/dL — AB (ref 65–99)
Glucose-Capillary: 164 mg/dL — ABNORMAL HIGH (ref 65–99)
Glucose-Capillary: 145 mg/dL — ABNORMAL HIGH (ref 65–99)
Glucose-Capillary: 163 mg/dL — ABNORMAL HIGH (ref 65–99)

## 2015-01-21 MED ORDER — IPRATROPIUM-ALBUTEROL 0.5-2.5 (3) MG/3ML IN SOLN
3.0000 mL | Freq: Two times a day (BID) | RESPIRATORY_TRACT | Status: DC
  Administered 2015-01-21 – 2015-01-23 (×4): 3 mL via RESPIRATORY_TRACT
  Filled 2015-01-21 (×4): qty 3

## 2015-01-21 MED ORDER — PRO-STAT SUGAR FREE PO LIQD
30.0000 mL | Freq: Two times a day (BID) | ORAL | Status: DC
  Administered 2015-01-21 – 2015-01-23 (×5): 30 mL via ORAL
  Filled 2015-01-21 (×6): qty 30

## 2015-01-21 NOTE — Progress Notes (Addendum)
Patient ID: [name], male   DOB: 04-26-1956, 59 y.o.   MRN: [id number] [facility] PROGRESS NOTE  [name] DZH:[id number] DOB: [birth year] DOA: 01/17/2015 PCP: Barbette Merino, MD  Brief narrative:    59 y.o. Male with past medical history of alcoholic liver cirrhosis, portal hypertension, varices, ascites, coagulopathy, VDRF, chronic diastolic CHF, type 2 diabetes mellitus, cocaine abuse, alcohol abuse, admissions for hepatic encephalopathy. Patient presented to [facility] on 01/17/2015 after he was found lying on the floor by family. In the ED, patient was hypothermic with rectal temperature of 95.37F. His blood work was notable for sodium of 132, potassium 5.2, creatinine 1.9, abnormal liver function enzymes, INR 1.5, ammonia 53, platelets 60, hemoglobin 9.3. CT head showed no acute intracranial abnormalities. Chest x-ray was significant for interstitial infiltrates which possibly could represent edema or infection. Patient was admitted to stepdown unit because of bilateral lower extremity cellulitis and electrolyte abnormalities in addition to hepatic encephalopathy. During this hospital stay patient underwent therapeutic paracentesis on 01/19/2015 with 3 L fluid drained. Paracentesis repeated 01/20/2015 with additional 3 L fluid drained. Peritoneal fluid analysis with no features of SBP. Patient was transferred to floor on 01/20/2015. Palliative care has been consulted for goals of care.  Anticipated discharge: if abdominal distention stable most likely he will be discharge by 01/23/2015   Assessment/Plan:    Principal problem Bilateral lower extremity cellulitis / Hypothermia  - Patient hypothermic on the admission and with elevated lactic acid but did not meet SIRS criteria - Patient was initially on vancomycin and Zosyn. His blood cultures so far are negative. - Patient is currently on doxycycline which we will complete for total of 7 days. - Lower extremity cellulitis is  improving.  Active problems: Anasarca / ascites - Secondary to alcoholic liver cirrhosis - Patient underwent paracentesis 01/19/2015 as well as 01/20/2015 with total of 3 L fluid drained during each paracentesis. Fluid analysis with no features of SBP. - Continue Lasix 40 mg daily  Acute hepatic encephalopathy - Related to chronic liver cirrhosis. Ammonia level elevated at the time of the admission. - CT head showed no acute intracranial findings. - Mental status much better this morning. - Continue rifaximin 550 mg twice daily in addition to lactulose 30 g 3 times daily  Chronic alcohol liver cirrhosis - Continue folic acid, multivitamin and thiamine - Continue rifaximin and lactulose -Continue Protonix 40 mg daily   Chronic diastolic CHF - Last 2-D echo in March 2016 showed grade 1 diastolic dysfunction with preserved ejection fraction - Continue daily Lasix 40 mg - Compensated  Acute on chronic kidney disease, stage III / non-anion gap metabolic acidosis - Worsening renal function on the admission likely reflective of hepatorenal syndrome - Baseline creatinine is around 1.7. Creatinine on this admission 1.9. - Continue to monitor renal function in hospital especially considering patient is on Lasix.  Hyperkalemia - Likely related to worsening renal insufficiency - Potassium subsequently normalized with Lasix  Hyponatremia - Likely related to fluid overload, anasarca - Sodium stable at 130  Moderate protein calorie malnutrition / Failure to thrive  - In the context of chronic liver cirrhosis, chronic illness - nutrition consulted  Diabetes mellitus type 2 with renal manifestations - We'll continue sliding scale insulin for now  COPD - Stable; not in acute exacerbation.  Leukopenia / anemia of chronic disease / thrombocytopenia - Pancytopenia likely related to bone marrow suppression from history of chronic liver cirrhosis - CBC stable - No reports of gross  bleed  Generalized weakness - Obtain physical therapy evaluation for safe discharge plan    DVT Prophylaxis  - SCD's bilaterally    Code Status: Full.  Family Communication:  plan of care discussed with the patient Disposition Plan: Home possibly by 01/23/2015.   IV access:  Peripheral IV  Procedures and diagnostic studies:    Ct Head Wo Contrast 01/17/2015 1. No acute intracranial abnormalities. 2. Mild cerebral atrophy with mild chronic microvascular ischemic changes in the cerebral white matter.   Electronically Signed   By: Vinnie Langton M.D.   On: 01/17/2015 14:30   Dg Chest Port 1 View 01/20/2015   Mild overall improvement in pulmonary aeration with mild right lower lobe atelectasis. No consolidation.   Electronically Signed   By: Richardean Sale M.D.   On: 01/20/2015 13:17   Dg Chest Port 1 View 01/17/2015  Coarsened interstitial markings in the mid to lower lungs new since previous exam question interstitial infiltrates which could represent edema or infection.   Electronically Signed   By: Lavonia Dana M.D.   On: 01/17/2015 12:28   Therapeutic paracentesis by CCM on 8/29 and 8/30: 3 L removed  Medical Consultants:  CCM Palliative care  Other Consultants:  Physical therapy  Nutrition   IAnti-Infectives:   Zosyn 8/27 --> 8/30 Vancomycin 8/27 --> 8/30 Oral doxycycline 8/30 -->   DEVINE, ALMA, MD  [facility] Pager [phone]  Time spent in minutes: 25 minutes  If 7PM-7AM, please contact night-coverage [email or web address] Password [email or web address] 01/21/2015, 11:45 AM   LOS: 4 days    HPI/Subjective: No acute overnight events. Patient reports somewhat feeling better.   Objective: Filed Vitals:   01/20/15 1523 01/21/15 0615 01/21/15 0805 01/21/15 0815  BP: 138/63 152/80    Pulse: 93 103    Temp: 97.9 F (36.6 C) 98.5 F (36.9 C)    TempSrc: Oral Oral    Resp: 18 20    Height:      Weight:  122.2 kg (269 lb 6.4 oz)    SpO2: 100% 99% 99% 99%    Intake/Output  Summary (Last 24 hours) at 01/21/15 1145 Last data filed at 01/21/15 0819  Gross per 24 hour  Intake    240 ml  Output   1550 ml  Net  -1310 ml    Exam:   General:  Pt is alert, follows commands appropriately, not in acute distress  Cardiovascular: Regular rate and rhythm, S1/S2 appreciated   Respiratory: Clear to auscultation bilaterally, no wheezing, no crackles, no rhonchi  Abdomen: distended, leak at the paracentesis site, bowel sounds present  Extremities: +2 LE pitting edema, pulses DP and PT palpable bilaterally  Neuro: Grossly nonfocal  Data Reviewed: Basic Metabolic Panel:  Recent Labs Lab 01/17/15 1135 01/18/15 0350 01/19/15 0404 01/20/15 0330 01/21/15 0605  NA 132* 133* 132* 133* 131*  K 5.2* 5.2* 4.2 4.1 3.9  CL 106 109 105 106 105  CO2 19* 19* 20* 20* 19*  GLUCOSE 131* 109* 147* 146* 141*  BUN 56* 60* 54* 50* 45*  CREATININE 1.92* 2.03* 1.90* 1.76* 1.64*  CALCIUM 8.1* 7.9* 7.8* 7.8* 7.6*  MG 2.1  --   --   --   --    Liver Function Tests:  Recent Labs Lab 01/17/15 1135 01/18/15 0350 01/19/15 0404 01/20/15 0330 01/21/15 0605  AST 89* 73* 73* 64* 68*  ALT 45 37 37 36 37  ALKPHOS 155* 119 117 123 128*  BILITOT 3.2* 2.9* 2.3* 1.9* 2.3*  PROT 6.1* 5.1* 5.0* 4.9* 5.2*  ALBUMIN 2.5* 2.1* 2.1* 2.4* 2.4*   No results for input(s): LIPASE, AMYLASE in the last 168 hours.  Recent Labs Lab 01/17/15 1130 01/19/15 0404  AMMONIA 53* 22   CBC:  Recent Labs Lab 01/17/15 1135 01/18/15 0350 01/19/15 0404 01/20/15 0330 01/21/15 0605  WBC 5.3 4.9 4.1 3.6* 5.1  HGB 9.3* 7.6* 7.5* 7.2* 8.5*  HCT 27.8* 22.9* 23.4* 21.8* 25.2*  MCV 91.7 92.0 92.5 93.6 92.6  PLT 60* 57* 57* 52* 59*   Cardiac Enzymes:  Recent Labs Lab 01/17/15 1130  CKTOTAL 170   BNP: Invalid input(s): POCBNP CBG:  Recent Labs Lab 01/20/15 0831 01/20/15 1500 01/20/15 1657 01/20/15 2125 01/21/15 0723  GLUCAP 167* 151* 206* 164* 145*    Recent Results (from the  past 240 hour(s))  Culture, Urine     Status: None   Collection Time: 01/17/15 12:30 PM  Result Value Ref Range Status   Specimen Description URINE, CATHETERIZED  Final   Special Requests NONE  Final   Culture   Final    NO GROWTH 1 DAY Performed at [facility]    Report Status 01/19/2015 FINAL  Final  MRSA PCR Screening     Status: Abnormal   Collection Time: 01/17/15  4:50 PM  Result Value Ref Range Status   MRSA by PCR POSITIVE (A) NEGATIVE Final  Culture, blood (routine x 2)     Status: None (Preliminary result)   Collection Time: 01/17/15  6:15 PM  Result Value Ref Range Status   Specimen Description BLOOD RIGHT ARM  Final   Special Requests BOTTLES DRAWN AEROBIC ONLY [place]  Final   Culture   Final    NO GROWTH 3 DAYS Performed at [facility]    Report Status PENDING  Incomplete  Culture, blood (routine x 2)     Status: None (Preliminary result)   Collection Time: 01/17/15  6:20 PM  Result Value Ref Range Status   Specimen Description BLOOD LEFT HAND  Final   Special Requests BOTTLES DRAWN AEROBIC ONLY 10CC  Final   Culture   Final    NO GROWTH 3 DAYS Performed at [facility]    Report Status PENDING  Incomplete  Body fluid culture     Status: None (Preliminary result)   Collection Time: 01/19/15 11:06 AM  Result Value Ref Range Status   Specimen Description PERITONEAL  Final   Special Requests NONE  Final   Gram Stain   Final    FEW WBC PRESENT,BOTH PMN AND MONONUCLEAR NO ORGANISMS SEEN    Culture   Final    NO GROWTH 2 DAYS Performed at [facility]    Report Status PENDING  Incomplete     Scheduled Meds: . clonazePAM  0.25 mg Oral BID  . doxycycline  100 mg Oral BID  . folic acid  1 mg Oral Daily  . furosemide  40 mg Oral Daily  . insulin aspart  0-9 Units Subcutaneous TID WC  . ipratropium-albuterol  3 mL Nebulization BID  . lactulose  30 g Oral TID  . multivitamin   1 tablet Oral Daily  . pantoprazole  40 mg Oral Daily   . rifaximin  550 mg Oral BID  . thiamine  100 mg Oral Daily

## 2015-01-21 NOTE — Progress Notes (Signed)
OT Cancellation Note  Patient Details Name: KALAI BACA MRN: [id number] DOB: 1956/03/01   Cancelled Treatment:    Reason Eval/Treat Not Completed: Pain limiting ability to participate. Pt too uncomfortable at this time. Will check back tomorrow.  Tayten Bergdoll 01/21/2015, 1:54 PM  Lesle Chris, OTR/L [phone] 01/21/2015

## 2015-01-21 NOTE — Progress Notes (Signed)
OT Cancellation Note  Patient Details Name: AMUN STEMM MRN: [id number] DOB: 05-20-1956   Cancelled Treatment:    Reason Eval/Treat Not Completed: Other (comment) Pt just back to bed s/p PT.  Will recheck on as schedule allows  Hessmer, Mickel Baas, [place] [phone] 01/21/2015, 12:41 PM

## 2015-01-21 NOTE — Progress Notes (Signed)
Physical Therapy Treatment Patient Details Name: [name] MRN: [id number] DOB: 01-Apr-1956 Today's Date: 01/21/2015    History of Present Illness Pt admitted by ambulance after being found on floor at home.  Pt dx sepsis with hx of DM, CVA, ETOH abuse with cirrhosis, COPD, bipolar and neuropathy    PT Comments    Pt very swollon through out esp his scrotum which inhibits much of his mobility. Assisted him OOB to amb to bathroom using a bariatric RW + 2 assist for safety however pt was able to support his own body weight and complete gait distance at [employer] Assist.  Pt required increased time and had MAX c/o scrotal pain/discomfort which inhibited amb further in the hallway.  Pt had a large BM.  Assisted with amb back to recliner and positioned to comfort.  B LE's elevated. Pt encouraged to sit in recliner for lunch.    Follow Up Recommendations  Home health PT     Equipment Recommendations  None recommended by PT    Recommendations for Other Services       Precautions / Restrictions Precautions Precautions: Fall Precaution Comments: very swallon scrotum Restrictions Weight Bearing Restrictions: No    Mobility  Bed Mobility Overal bed mobility: Needs Assistance Bed Mobility: Supine to Sit     Supine to sit: Max assist     General bed mobility comments: MAX assist esp to scoot to EOB with caution to swallon scrotum   Transfers Overall transfer level: Needs assistance Equipment used: Rolling walker (2 wheeled) (Bariatric ) Transfers: Sit to/from Stand Sit to Stand: Min assist;+2 safety/equipment         General transfer comment: cues for transition position and use of UEs to self assist.  assisted off elevated bed, on/off toilet and onto recliner.   Ambulation/Gait Ambulation/Gait assistance: +2 safety/equipment;Min guard Ambulation Distance (Feet): 22 Feet (11 feet ) Assistive device: Rolling walker (2 wheeled) (Bariatric ) Gait Pattern/deviations: Step-to  pattern;Decreased step length - right;Decreased step length - left;Wide base of support Gait velocity: decreased.   General Gait Details: increased time with wide BOS due to enlarged scrotum   Stairs            Wheelchair Mobility    Modified Rankin (Stroke Patients Only)       Balance                                    Cognition Arousal/Alertness: Awake/alert Behavior During Therapy: WFL for tasks assessed/performed Overall Cognitive Status: Within Functional Limits for tasks assessed                      Exercises      General Comments        Pertinent Vitals/Pain Pain Assessment: Faces Faces Pain Scale: Hurts even more Pain Location: scrotum Pain Descriptors / Indicators: Heaviness;Constant Pain Intervention(s): Monitored during session;Repositioned    Home Living                      Prior Function            PT Goals (current goals can now be found in the care plan section) Progress towards PT goals: Progressing toward goals    Frequency  Min 3X/week    PT Plan      Co-evaluation             End of  Session Equipment Utilized During Treatment: Gait belt Activity Tolerance: Patient limited by pain Patient left: in chair;with call bell/phone within reach;with nursing/sitter in room     Time: 3500-9381 PT Time Calculation (min) (ACUTE ONLY): 30 min  Charges:  $Gait Training: 8-22 mins $Therapeutic Activity: 8-22 mins                    G Codes:      Rica Koyanagi  PTA [facility]  Acute  Rehab Pager      [phone]

## 2015-01-21 NOTE — Progress Notes (Signed)
Initial Nutrition Assessment  DOCUMENTATION CODES:   Obesity unspecified  INTERVENTION:  - Continue Heart Healthy/Carb Modified diet - Will order Prostat BID, each supplement provides 100 kcal and 15 grams protein - RD will continue to monitor for needs  NUTRITION DIAGNOSIS:   Altered nutrition lab value related to chronic illness as evidenced by other (see comment) (renal panel and Na labs).  GOAL:   Patient will meet greater than or equal to 90% of their needs  MONITOR:   PO intake, Supplement acceptance, Weight trends, Labs, I & O's  REASON FOR ASSESSMENT:   Consult Assessment of nutrition requirement/status  ASSESSMENT:   59 y.o. Male with past medical history of alcoholic liver cirrhosis, portal hypertension, varices, ascites, coagulopathy, VDRF, chronic diastolic CHF, type 2 diabetes mellitus, cocaine abuse, alcohol abuse, admissions for hepatic encephalopathy. Patient presented to [facility] long hospital on 01/17/2015 after he was found lying on the floor by family. In the ED, patient was hypothermic with rectal temperature of 95.42F. His blood work was notable for sodium of 132, potassium 5.2, creatinine 1.9, abnormal liver function enzymes, INR 1.5, ammonia 53, platelets 60, hemoglobin 9.3. CT head showed no acute intracranial abnormalities. Chest x-ray was significant for interstitial infiltrates which possibly could represent edema or infection. Patient was admitted to stepdown unit because of bilateral lower extremity cellulitis and electrolyte abnormalities in addition to hepatic encephalopathy.   Pt seen for consult. BMI indicates obesity; RN and MD notes indicates severe edema due to cellulitis and ascites. Pt ate 100% of breakfast today and visualized lunch tray in front of pt with ~50% completion.  Pt was sleeping at time of visit and RD did not feel it was necessary to arouse pt at this time. Unable to perform physical assessment but notable muscle or fat wasting  during visualization of pt. Per chart review, pt has been gaining weight since January, 2016 which is likely at least partly fluid related.  Unsure if pt is fully meeting needs at this time. Medications reviewed. Labs reviewed; CBGs: 114-224 mg/dL, Na: 131 mmol/L, BUN/creatinine elevated but trending down, Ca: 7.6 mg/dL, GFR: 44.    Diet Order:  Diet heart healthy/carb modified Room service appropriate?: Yes; Fluid consistency:: Thin; Fluid restriction:: 1200 mL Fluid  Skin:  R knee laceration  Last BM:  8/31  Height:   Ht Readings from Last 1 Encounters:  01/17/15 5\' 11"  (1.803 m)    Weight:   Wt Readings from Last 1 Encounters:  01/21/15 269 lb 6.4 oz (122.2 kg)    Ideal Body Weight:  78.18 kg (kg)  BMI:  Body mass index is 37.59 kg/(m^2).  Estimated Nutritional Needs:   Kcal:  1800-2000  Protein:  75-85 grams  Fluid:  1.5-1.7 L/day  EDUCATION NEEDS:   No education needs identified at this time     Jarome Matin, RD, LDN Inpatient Clinical Dietitian Pager # [phone] After hours/weekend pager # [phone]

## 2015-01-22 DIAGNOSIS — K7469 Other cirrhosis of liver: Secondary | ICD-10-CM

## 2015-01-22 LAB — CBC
HEMATOCRIT: 24.9 % — AB (ref 39.0–52.0)
HEMOGLOBIN: 8.3 g/dL — AB (ref 13.0–17.0)
MCH: 30.7 pg (ref 26.0–34.0)
MCHC: 33.3 g/dL (ref 30.0–36.0)
MCV: 92.2 fL (ref 78.0–100.0)
Platelets: 72 10*3/uL — ABNORMAL LOW (ref 150–400)
RBC: 2.7 MIL/uL — AB (ref 4.22–5.81)
RDW: 21.7 % — ABNORMAL HIGH (ref 11.5–15.5)
WBC: 4.7 10*3/uL (ref 4.0–10.5)

## 2015-01-22 LAB — BASIC METABOLIC PANEL
ANION GAP: 6 (ref 5–15)
BUN: 46 mg/dL — ABNORMAL HIGH (ref 6–20)
CO2: 19 mmol/L — AB (ref 22–32)
Calcium: 7.8 mg/dL — ABNORMAL LOW (ref 8.9–10.3)
Chloride: 104 mmol/L (ref 101–111)
Creatinine, Ser: 1.53 mg/dL — ABNORMAL HIGH (ref 0.61–1.24)
GFR calc non Af Amer: 48 mL/min — ABNORMAL LOW (ref >60)
GFR, EST AFRICAN AMERICAN: 56 mL/min — AB (ref >60)
GLUCOSE: 137 mg/dL — AB (ref 65–99)
POTASSIUM: 3.8 mmol/L (ref 3.5–5.1)
Sodium: 129 mmol/L — ABNORMAL LOW (ref 135–145)

## 2015-01-22 LAB — GLUCOSE, CAPILLARY
GLUCOSE-CAPILLARY: 121 mg/dL — AB (ref 65–99)
GLUCOSE-CAPILLARY: 193 mg/dL — AB (ref 65–99)
GLUCOSE-CAPILLARY: 190 mg/dL — AB (ref 65–99)
Glucose-Capillary: 190 mg/dL — ABNORMAL HIGH (ref 65–99)

## 2015-01-22 LAB — BODY FLUID CULTURE: Culture: NO GROWTH

## 2015-01-22 NOTE — Clinical Social Work Note (Signed)
Clinical Social Work Assessment  Patient Details  Name: [name] MRN: [id number] Date of Birth: 07-24-1955  Date of referral:  01/22/15               Reason for consult:  Discharge Planning                Permission sought to share information with:    Permission granted to share information::  No  Name::        Agency::     Relationship::     Contact Information:     Housing/Transportation Living arrangements for the past 2 months:  Apartment Source of Information:  Patient Patient Interpreter Needed:  None Criminal Activity/Legal Involvement Pertinent to Current Situation/Hospitalization:  No - Comment as needed Significant Relationships:  Other(Comment) (Patient's family is minimally involved) Lives with:  Self Do you feel safe going back to the place where you live?  Yes Need for family participation in patient care:  No (Coment)  Care giving concerns:  Patient lives alone. PT recommends HH and patient is agreeable to [facility] services at DC.   Social Worker assessment / plan:  CSW received referral in order to assist with DC planning. CSW reviewed chart and patient was discussed during long length of stay meeting. Patient was recently at [facility] and [facility] [address] prior to insurance approval. CSW spoke with [facility] Rehab who reports that patient left AMA.   CSW met with patient at bedside. CSW spoke with patient re: that [employer] would not cover SNF placement at this time due to PT only recommending [place]. CSW explained that patient could use Medicaid for SNF placement but that patient would have to stay at least 30 days. Patient reports he enjoys his apartment and has a hospital bed and all equipment needed. Patient is not interested in SNF placement at this time and reports he will only return home. Patient reports that he has no safety concerns and believes that he can care for himself.  Patient reports limited support. Patient has a brother in jail in [place] who  plans to stay with him once he is released. Patient's dtr lives in [place] and has said she could come and stay but patient reports strained relationship and does not believe she will actually come. Patient has ex-girlfriend who comes to his apartment occasionally but is not reliable. Even though patient has limited support, patient continues to refuse SNF placement and wants to return home.  CSW made CM aware of HH needs.  Employment status:  Disabled (Comment on whether or not currently receiving Disability) Insurance information:  Programmer, applications, Medicaid In [place] PT Recommendations:  Home with [place] / Referral to community resources:  [place]  Patient/Family's Response to care:  Patient engaged during assessment but tearful.   Patient/Family's Understanding of and Emotional Response to Diagnosis, Current Treatment, and Prognosis:  Patient mourns his previous life of working and caring for himself. Patient is hopeful to recover fully some day so that he can be completely independent and drive again. Patient is tearful about his current conditions and pains.  Emotional Assessment Appearance:  Appears older than stated age Attitude/Demeanor/Rapport:  Crying Affect (typically observed):  Depressed Orientation:  Oriented to Self, Oriented to Place, Oriented to  Time, Oriented to Situation Alcohol / Substance use:  Other (Patient has history of alcohol use but denies any current use) Psych involvement (Current and /or in the community):     Discharge  Needs  Concerns to be addressed:  No discharge needs identified Readmission within the last 30 days:  Yes Current discharge risk:  None Barriers to Discharge:  No Barriers Identified   Boone Master, Gregory 01/22/2015, 3:16 PM [phone]

## 2015-01-22 NOTE — Progress Notes (Signed)
Occupational Therapy Treatment Patient Details Name: [name] MRN: [id number] DOB: [birth year] Today's Date: 01/22/2015    History of present illness Pt admitted by ambulance after being found on floor at home.  Pt dx sepsis with hx of DM, CVA, ETOH abuse with cirrhosis, COPD, bipolar and neuropathy   OT comments  Do not feel this pt can DC home alone without formal A           Precautions / Restrictions Precautions Precautions: Fall Precaution Comments: very swallon scrotum Restrictions Weight Bearing Restrictions: No          Balance                                   ADL Overall ADL's : Needs assistance/impaired                                       General ADL Comments: swollen scrotum limiting all ADL activity. Pt did agree to BUE exercise. Do not feel this pt will be successful going home. He states he does not have consistent A      Vision                     Perception     Praxis      Cognition   Behavior During Therapy: WFL for tasks assessed/performed Overall Cognitive Status: Within Functional Limits for tasks assessed                       Extremity/Trunk Assessment               Exercises General Exercises - Upper Extremity Shoulder Flexion: AROM;Prone;Right;Left;Both;10 reps Elbow Flexion: AROM;Right;Left;Both;10 reps Elbow Extension: 10 reps;Both Wrist Flexion: AROM;Both;10 reps Wrist Extension: AROM;Both;10 reps           Pertinent Vitals/ Pain       Faces Pain Scale: Hurts little more Pain Location: scrotum Pain Descriptors / Indicators: Pressure Pain Intervention(s): Monitored during session  Home Living                                          Prior Functioning/Environment              Frequency       Progress Toward Goals  OT Goals(current goals can now be found in the care plan section)  Progress towards OT goals: OT to reassess next  treatment     Plan Needs SNF      End of Session     Activity Tolerance  in bed with call bell within reach              Time: 1829-9371 OT Time Calculation (min): 26 min  Charges: OT Treatments $Therapeutic Exercise: 23-37 mins  Norberta Stobaugh D 01/22/2015, 10:13 AM

## 2015-01-22 NOTE — Progress Notes (Signed)
Daily Progress Note   Patient Name: [name]       Date: 01/22/2015 DOB: Jul 14, 1955  Age: 59 y.o. MRN#: [id number] Attending Physician: Robbie Lis, MD Primary Care Physician: Barbette Merino, MD Admit Date: 01/17/2015  Reason for Consultation/Follow-up: Establishing goals of care  Subjective: Sleeping on entering room. Arouses easily. Attempted to engage in discussion regarding goals of care moving forward. The patient appears unable to process a plan other than returning to an apartment he is currently renting. He became tearful and was unwilling to discuss anything other than his goal of returning to this home. He was unable to acknowledge that he is very ill and is approaching the end of his life.  I spoke with his girlfriend (possibly ex-girlfriend) on the phone and she reports that he continues to talk about family coming from out of state to care for him in his apartment home. She does not believe this is the case as his daughter lives in [place] and his brother is in jail in another state.   Interval Events: Patient with no acute symptoms currently Continue to address goals of care, full code for now. Daughter trying to come here from [place]. Unclear if she will make it before his discharge from hospital.  Length of Stay: 5 days  Current Medications: Scheduled Meds:  . antiseptic oral rinse  7 mL Mouth Rinse BID  . Chlorhexidine Gluconate Cloth  6 each Topical Q0600  . clonazePAM  0.25 mg Oral BID  . collagenase   Topical Daily  . doxycycline  100 mg Oral BID  . feeding supplement (PRO-STAT SUGAR FREE 64)  30 mL Oral BID  . folic acid  1 mg Oral Daily  . furosemide  40 mg Oral Daily  . insulin aspart  0-9 Units Subcutaneous TID WC  . ipratropium-albuterol  3 mL Nebulization BID  . lactulose  30 g Oral TID  . multivitamin with minerals  1 tablet Oral Daily  . pantoprazole  40 mg Oral Daily  . rifaximin  550 mg Oral BID  . sodium chloride  3 mL Intravenous Q12H  .  thiamine  100 mg Oral Daily    Continuous Infusions:    PRN Meds: albuterol, ondansetron **OR** [DISCONTINUED] ondansetron (ZOFRAN) IV, oxyCODONE  Palliative Performance Scale: 30%     Vital Signs: BP 131/64 mmHg  Pulse 93  Temp(Src) 98.2 F (36.8 C) (Oral)  Resp 17  Ht 5\' 11"  (1.803 m)  Wt 121.927 kg (268 lb 12.8 oz)  BMI 37.51 kg/m2  SpO2 100% SpO2: SpO2: 100 % O2 Device: O2 Device: Not Delivered O2 Flow Rate:    Intake/output summary:   Intake/Output Summary (Last 24 hours) at 01/22/15 1721 Last data filed at 01/22/15 1452  Gross per 24 hour  Intake    360 ml  Output   1500 ml  Net  -1140 ml   LBM:   Baseline Weight: Weight: 115.4 kg (254 lb 6.6 oz) Most recent weight: Weight: 121.927 kg (268 lb 12.8 oz)  Physical Exam: Weak pale, somewhat confused Abdomen distended Bilateral LE edema, dressing noted Clear S1S2           Additional Data Reviewed: Recent Labs     01/21/15  0605  01/22/15  0719  WBC  5.1  4.7  HGB  8.5*  8.3*  PLT  59*  72*  NA  131*  129*  BUN  45*  46*  CREATININE  1.64*  1.53*  Problem List:  Patient Active Problem List   Diagnosis Date Noted  . Severe muscle deconditioning   . Weakness generalized   . Encounter for palliative care   . Anasarca   . Fall 01/17/2015  . Sepsis 01/17/2015  . Cellulitis 01/17/2015  . Lactic acidosis 01/17/2015  . Protein-calorie malnutrition, severe 12/19/2014  . Pressure ulcer 12/15/2014  . Acute respiratory failure 12/14/2014  . Acute renal failure 12/14/2014  . Chronic diastolic heart failure 42/59/5638  . Ascites   . Upper GI bleed   . Medically noncompliant 09/26/2014  . UTI (lower urinary tract infection)   . Acute blood loss anemia   . Alcoholic cirrhosis of liver without ascites   . Acute combined systolic and diastolic congestive heart failure   . AKI (acute kidney injury)   . Respiratory failure   . Unresponsive   . Hyperkalemia 07/28/2014  . Encephalopathy 07/28/2014   . Suicidal ideations   . Hx of bipolar disorder   . Alcoholic cirrhosis of liver with ascites   . PN (peripheral neuropathy)   . Retinal detachment   . MDD (major depressive disorder) 02/14/2014  . Depression, major, recurrent, moderate 02/05/2014  . Anemia 01/27/2014  . GI bleed 01/14/2014  . Altered mental status 09/13/2013  . Hypoglycemic encephalopathy 09/13/2013  . Metabolic acidosis 75/64/3329  . Acute kidney injury 09/13/2013  . Acute encephalopathy 08/28/2013  . Encephalopathy, hepatic 08/28/2013  . Pancytopenia 04/11/2013  . Falls 04/11/2013  . Hepatic encephalopathy 04/01/2013  . Tobacco abuse 03/11/2013  . Cellulitis of lower leg 12/13/2012  . Bipolar affect, depressed 12/13/2012  . Serum ammonia increased 06/20/2012  . Cirrhosis   . Thrombocytopenia 04/27/2012  . Weakness 04/27/2012  . Depression 04/27/2012  . Ulcer, stomach peptic 04/27/2012  . Acute GI bleeding 04/04/2012  . HTN (hypertension) 04/04/2012  . Diabetes mellitus 04/04/2012  . Alcohol abuse 04/08/2011    Class: Acute     Palliative Care Assessment & Plan    Code Status:  Full code  Goals of Care:   full code, continue any and all life prolonging, life maintaining measures. Dr. Rowe Pavy from our service discussed with both patient and daughter over the phone on 01-18-15. Unfortunately, his daughter has not been able to come from [place] as she had been hoping. He reports his brother is planning on coming from out of state to visit him, however Constance Holster reports that the brother he is referring to is currently in jail  I attempted to discuss plans for his long-term goals of care further again today. He is very focused on returning to an apartment that he is currently renting. He states that his daughter or his brother will move in with him in order to help care for him. I spoke with his girlfriend, Constance Holster, on the phone and she states that she does not believe his brother nor his daughter are planning to  move here as they both live out of state. Overall, Mr. Dygert did not seem to be able to process the fact that he is is very ill and would not engage in any conversations regarding goals moving forward.   I advised him he needs to continue to focus on what is most important to him and what medical interventions are likely to get him to this goal. We discussed that he should continue to have conversations or regarding his long-term goals of care with his family. I told him this could continue to occur at another facility or through his  PCP if his family is unable to make an in town before he is discharged. Continue to address code status and disposition arrangements. Likely would benefit from SNF, but has been refusing to consider.  Desire for further Chaplaincy support:no  3. Symptom Management:   continue to monitor.   4. Palliative Prophylaxis:  Stool Softener: yes  5. Prognosis: weeks-months  5. Discharge Planning: pending clinical course, ?SNF   Care plan was discussed with Patient as well as his girlfriend (possibly ex-girlfriend), Constance Holster No family present at the bedside.   Thank you for allowing the Palliative Medicine Team to assist in the care of this patient.   Time In: 9826 Time Out: 1315 Total Time 30 Prolonged Time Billed  no     Greater than 50%  of this time was spent counseling and coordinating care related to the above assessment and plan.  Fahed Morten Dalia Heading, MD  01/22/2015, 5:21 PM  [phone]  Please contact Palliative Medicine Team phone at [phone] for questions and concerns.

## 2015-01-22 NOTE — Progress Notes (Addendum)
Patient ID: [name], male   DOB: Jan 31, 1956, 59 y.o.   MRN: [id number] [facility] PROGRESS NOTE  [name] BSW:[id number] DOB: [birth year] DOA: 01/17/2015 PCP: Barbette Merino, MD  Brief narrative:    59 y.o. Male with past medical history of alcoholic liver cirrhosis, portal hypertension, varices, ascites, coagulopathy, VDRF, chronic diastolic CHF, type 2 diabetes mellitus, cocaine abuse, alcohol abuse, admissions for hepatic encephalopathy. Patient presented to [facility] hospital on 01/17/2015 after he was found lying on the floor by family. In the ED, patient was hypothermic with rectal temperature of 95.54F. His blood work was notable for sodium of 132, potassium 5.2, creatinine 1.9, abnormal liver function enzymes, INR 1.5, ammonia 53, platelets 60, hemoglobin 9.3. CT head showed no acute intracranial abnormalities. Chest x-ray was significant for interstitial infiltrates which possibly could represent edema or infection. Patient was admitted to stepdown unit because of bilateral lower extremity cellulitis and electrolyte abnormalities in addition to hepatic encephalopathy. During this hospital stay patient underwent therapeutic paracentesis on 01/19/2015 with 3 L fluid drained. Paracentesis repeated 01/20/2015 with additional 3 L fluid drained. Peritoneal fluid analysis with no features of SBP. Patient was transferred to floor on 01/20/2015. Palliative care has been consulted for goals of care.  Anticipated discharge: most likely discharge by 01/23/2015 if abd distention stable.   Assessment/Plan:    Principal problem Bilateral lower extremity cellulitis / Hypothermia  - Onitially on vancomycin and Zosyn. Since blood cultures negative to date changed abx to doxycyline  - Lower extremity cellulitis better. - Per PT - recommendation for HHPT  Active problems: Anasarca / ascites - Secondary to alcoholic liver cirrhosis - Status post paracentesis 01/19/2015 as well as 01/20/2015  with total of 6 L fluid drained. No acute infection identified on fluid analysis. - Continue Lasix 40 mg daily  Acute hepatic encephalopathy - Related to chronic liver cirrhosis. Ammonia level elevated at the time of the admission. - CT head showed no acute intracranial findings. - Continue rifaximin 550 mg twice daily and lactulose 30 g 3 times daily  Chronic alcohol liver cirrhosis - Continue folic acid, multivitamin and thiamine - Continue rifaximin and lactulose - Continue Protonix 40 mg daily   Chronic diastolic CHF - Last 2-D echo in March 2016 showed grade 1 diastolic dysfunction with preserved ejection fraction - Compensated, no respiratory distress - Continue daily Lasix 40 mg  Acute on chronic kidney disease, stage III / non-anion gap metabolic acidosis - Worsening renal function on the admission likely reflective of hepatorenal syndrome - Baseline creatinine is around 1.7. Creatinine on this admission 1.9 but it is trending down - BMP this morning pending.  Hyperkalemia - Likely related to worsening renal insufficiency - Pt receiving lasix which resulted in normal potassium level   Hyponatremia - Likely related to fluid overload, anasarca - Sodium level stable - BMP this am is pending   Moderate protein calorie malnutrition / Failure to thrive  - In the context of chronic liver cirrhosis, chronic illness - Continue nutritional supplementation per nutritionist recommendations   Diabetes mellitus type 2 with renal manifestations - On SSI - CBG's in past 24 hours: 163, 162, 167  COPD - Stable; not in acute exacerbation.  Leukopenia / anemia of chronic disease / thrombocytopenia - Pancytopenia likely secondary to bone marrow suppression from history of chronic liver cirrhosis - CBC this am is pending   Generalized weakness - Per PT - HHPT recommended     DVT Prophylaxis  - SCD's bilaterally  Code Status: Full.  Family Communication:  plan of care  discussed with the patient Disposition Plan: Home by 01/23/2015.   IV access:  Peripheral IV  Procedures and diagnostic studies:    Ct Head Wo Contrast 01/17/2015 1. No acute intracranial abnormalities. 2. Mild cerebral atrophy with mild chronic microvascular ischemic changes in the cerebral white matter.   Electronically Signed   By: Vinnie Langton M.D.   On: 01/17/2015 14:30   Dg Chest Port 1 View 01/20/2015   Mild overall improvement in pulmonary aeration with mild right lower lobe atelectasis. No consolidation.   Electronically Signed   By: Richardean Sale M.D.   On: 01/20/2015 13:17   Dg Chest Port 1 View 01/17/2015  Coarsened interstitial markings in the mid to lower lungs new since previous exam question interstitial infiltrates which could represent edema or infection.   Electronically Signed   By: Lavonia Dana M.D.   On: 01/17/2015 12:28   Therapeutic paracentesis by CCM on 8/29 and 8/30: 3 L removed  Medical Consultants:  [facility] Palliative care  Other Consultants:  Physical therapy  Nutrition   IAnti-Infectives:   Zosyn 8/27 --> 8/30 Vancomycin 8/27 --> 8/30 Oral doxycycline 8/30 -->   [name], MD  [facility] Pager [phone]  Time spent in minutes: 25 minutes  If 7PM-7AM, please contact night-coverage [email or web address] Password [email or web address] 01/22/2015, 6:23 AM   LOS: 5 days    HPI/Subjective: No acute overnight events. Patient reports somewhat feeling better.   Objective: Filed Vitals:   01/21/15 0815 01/21/15 1426 01/21/15 2125 01/22/15 0548  BP:  141/70 146/60 123/59  Pulse:  96 92 100  Temp:  98.3 F (36.8 C) 98.1 F (36.7 C) 98.2 F (36.8 C)  TempSrc:  Oral Oral Oral  Resp:  18 18 18   Height:      Weight:      SpO2: 99% 99% 98% 100%    Intake/Output Summary (Last 24 hours) at 01/22/15 6144 Last data filed at 01/21/15 1825  Gross per 24 hour  Intake    480 ml  Output    600 ml  Net   -120 ml    Exam:   General:  Pt is alert, not in acute  distress  Cardiovascular: Regular rate and rhythm, S1/S2 (+)  Respiratory: bilateral air entry, no wheezing   Abdomen: abdomen distended, non tender, appreciate bowel sounds   Extremities: +2 LE pitting edema, pulses palpable   Neuro: Nonfocal  Data Reviewed: Basic Metabolic Panel:  Recent Labs Lab 01/17/15 1135 01/18/15 0350 01/19/15 0404 01/20/15 0330 01/21/15 0605  NA 132* 133* 132* 133* 131*  K 5.2* 5.2* 4.2 4.1 3.9  CL 106 109 105 106 105  CO2 19* 19* 20* 20* 19*  GLUCOSE 131* 109* 147* 146* 141*  BUN 56* 60* 54* 50* 45*  CREATININE 1.92* 2.03* 1.90* 1.76* 1.64*  CALCIUM 8.1* 7.9* 7.8* 7.8* 7.6*  MG 2.1  --   --   --   --    Liver Function Tests:  Recent Labs Lab 01/17/15 1135 01/18/15 0350 01/19/15 0404 01/20/15 0330 01/21/15 0605  AST 89* 73* 73* 64* 68*  ALT 45 37 37 36 37  ALKPHOS 155* 119 117 123 128*  BILITOT 3.2* 2.9* 2.3* 1.9* 2.3*  PROT 6.1* 5.1* 5.0* 4.9* 5.2*  ALBUMIN 2.5* 2.1* 2.1* 2.4* 2.4*   No results for input(s): LIPASE, AMYLASE in the last 168 hours.  Recent Labs Lab 01/17/15 1130 01/19/15 0404  AMMONIA 53* 22  CBC:  Recent Labs Lab 01/17/15 1135 01/18/15 0350 01/19/15 0404 01/20/15 0330 01/21/15 0605  WBC 5.3 4.9 4.1 3.6* 5.1  HGB 9.3* 7.6* 7.5* 7.2* 8.5*  HCT 27.8* 22.9* 23.4* 21.8* 25.2*  MCV 91.7 92.0 92.5 93.6 92.6  PLT 60* 57* 57* 52* 59*   Cardiac Enzymes:  Recent Labs Lab 01/17/15 1130  CKTOTAL 170   BNP: Invalid input(s): POCBNP CBG:  Recent Labs Lab 01/20/15 2125 01/21/15 0723 01/21/15 1206 01/21/15 1713 01/21/15 2140  GLUCAP 164* 145* 163* 162* 167*    Recent Results (from the past 240 hour(s))  Culture, Urine     Status: None   Collection Time: 01/17/15 12:30 PM  Result Value Ref Range Status   Specimen Description URINE, CATHETERIZED  Final   Special Requests NONE  Final   Culture   Final    NO GROWTH 1 DAY Performed at [facility]    Report Status 01/19/2015 FINAL  Final   MRSA PCR Screening     Status: Abnormal   Collection Time: 01/17/15  4:50 PM  Result Value Ref Range Status   MRSA by PCR POSITIVE (A) NEGATIVE Final  Culture, blood (routine x 2)     Status: None (Preliminary result)   Collection Time: 01/17/15  6:15 PM  Result Value Ref Range Status   Specimen Description BLOOD RIGHT ARM  Final   Special Requests BOTTLES DRAWN AEROBIC ONLY [place]  Final   Culture   Final    NO GROWTH 3 DAYS Performed at [facility]    Report Status PENDING  Incomplete  Culture, blood (routine x 2)     Status: None (Preliminary result)   Collection Time: 01/17/15  6:20 PM  Result Value Ref Range Status   Specimen Description BLOOD LEFT HAND  Final   Special Requests BOTTLES DRAWN AEROBIC ONLY 10CC  Final   Culture   Final    NO GROWTH 3 DAYS Performed at [facility]    Report Status PENDING  Incomplete  Body fluid culture     Status: None (Preliminary result)   Collection Time: 01/19/15 11:06 AM  Result Value Ref Range Status   Specimen Description PERITONEAL  Final   Special Requests NONE  Final   Gram Stain   Final    FEW WBC PRESENT,BOTH PMN AND MONONUCLEAR NO ORGANISMS SEEN    Culture   Final    NO GROWTH 2 DAYS Performed at [facility]    Report Status PENDING  Incomplete    . clonazePAM  0.25 mg Oral BID  . collagenase   Topical Daily  . doxycycline  100 mg Oral BID  . feeding supplement (PRO-STAT SUGAR FREE 64)  30 mL Oral BID  . folic acid  1 mg Oral Daily  . furosemide  40 mg Oral Daily  . insulin aspart  0-9 Units Subcutaneous TID WC  . ipratropium-albuterol  3 mL Nebulization BID  . lactulose  30 g Oral TID  . multivitamin with minerals  1 tablet Oral Daily  . mupirocin ointment  1 application Nasal BID  . pantoprazole  40 mg Oral Daily  . rifaximin  550 mg Oral BID  . thiamine  100 mg Oral Daily

## 2015-01-22 NOTE — Care Management Important Message (Signed)
Important Message  Patient Details  Name: [name] MRN: [id number] Date of Birth: [birth year]   Medicare Important Message Given:  Yes-third notification given    Camillo Flaming 01/22/2015, 2:37 [place] Message  Patient Details  Name: [name] MRN: [id number] Date of Birth: 03-14-1956   Medicare Important Message Given:  Yes-third notification given    Camillo Flaming 01/22/2015, 2:37 PM

## 2015-01-23 DIAGNOSIS — I10 Essential (primary) hypertension: Secondary | ICD-10-CM

## 2015-01-23 LAB — GLUCOSE, CAPILLARY
GLUCOSE-CAPILLARY: 162 mg/dL — AB (ref 65–99)
GLUCOSE-CAPILLARY: 165 mg/dL — AB (ref 65–99)
Glucose-Capillary: 160 mg/dL — ABNORMAL HIGH (ref 65–99)

## 2015-01-23 LAB — CULTURE, BLOOD (ROUTINE X 2)
CULTURE: NO GROWTH
CULTURE: NO GROWTH

## 2015-01-23 MED ORDER — LANTUS SOLOSTAR 100 UNIT/ML ~~LOC~~ SOPN: 10.0000 [IU] | PEN_INJECTOR | Freq: Every day | SUBCUTANEOUS | Status: DC

## 2015-01-23 MED ORDER — ALBUTEROL SULFATE (2.5 MG/3ML) 0.083% IN NEBU: 2.5000 mg | INHALATION_SOLUTION | RESPIRATORY_TRACT | Status: DC | PRN

## 2015-01-23 MED ORDER — COLLAGENASE 250 UNIT/GM EX OINT: TOPICAL_OINTMENT | Freq: Every day | CUTANEOUS | Status: AC

## 2015-01-23 MED ORDER — RIFAXIMIN 550 MG PO TABS: 550.0000 mg | ORAL_TABLET | Freq: Two times a day (BID) | ORAL | Status: AC

## 2015-01-23 MED ORDER — PRO-STAT SUGAR FREE PO LIQD: 30.0000 mL | Freq: Two times a day (BID) | ORAL | Status: AC

## 2015-01-23 MED ORDER — PANTOPRAZOLE SODIUM 40 MG PO TBEC: 40.0000 mg | DELAYED_RELEASE_TABLET | Freq: Every day | ORAL | Status: AC

## 2015-01-23 NOTE — Progress Notes (Signed)
PT Cancellation Note  Patient Details Name: [name] MRN: [id number] DOB: 12-02-1955   Cancelled Treatment:     Pt declined stating he was leaving today and that his scrotum was still very swollen and painful with activity.    Nathanial Rancher 01/23/2015, 3:00 PM

## 2015-01-23 NOTE — Discharge Instructions (Signed)
Rifaximin tablets What is this medicine? RIFAXIMIN (ri FAX i men) is an antibiotic. It is used to treat traveler's diarrhea. It is also used to prevent hepatic encephalopathy, a brain disorder that results from liver disease. This medicine may be used for other purposes; ask your health care provider or pharmacist if you have questions. COMMON BRAND NAME(S): Xifaxan What should I tell my health care provider before I take this medicine? They need to know if you have any of these conditions: -bloody or tarry stools -fever -liver disease -an unusual or allergic reaction to rifaximin, rifampin, rifabutin, other medicines, foods, dyes, or preservatives -pregnant or trying to get pregnant -breast-feeding How should I use this medicine? Take this medicine by mouth with a glass of water. Follow the directions on the prescription label. This medicine may be taken with or without food. Take your medicine at regular intervals. Do not take your medicine more often than directed. Take all of your medicine as directed even if you think your are better. Do not skip doses or stop your medicine early. Talk to your pediatrician regarding the use of this medicine in children. Special care may be needed. Overdosage: If you think you have taken too much of this medicine contact a poison control center or emergency room at once. NOTE: This medicine is only for you. Do not share this medicine with others. What if I miss a dose? If you miss a dose, take it as soon as you can. If it is almost time for your next dose, take only that dose. Do not take double or extra doses. What may interact with this medicine? -birth control pills -cyclosporine -midazolam -verapamil This list may not describe all possible interactions. Give your health care provider a list of all the medicines, herbs, non-prescription drugs, or dietary supplements you use. Also tell them if you smoke, drink alcohol, or use illegal drugs. Some items  may interact with your medicine. What should I watch for while using this medicine? Tell your doctor or healthcare professional if your symptoms do not start to get better or if they get worse. Do not treat diarrhea with over the counter products. Contact your doctor if you have diarrhea that lasts more than 2 days or if it is severe and watery. What side effects may I notice from receiving this medicine? Side effects that you should report to your doctor or health care professional as soon as possible: -allergic reactions like skin rash, itching or hives, swelling of the face, lips, or tongue -blood in the urine -bloody or watery diarrhea -fever Side effects that usually do not require medical attention (report to your doctor or health care professional if they continue or are bothersome): -constipation -headache -nausea, vomiting -stomach bloating, gas -urgent bowel movements This list may not describe all possible side effects. Call your doctor for medical advice about side effects. You may report side effects to FDA at 1-800-FDA-1088. Where should I keep my medicine? Keep out of the reach of children. Store at room temperature between 15 and 30 degrees C (59 and 86 degrees F). Throw away any unused medicine after the expiration date. NOTE: This sheet is a summary. It may not cover all possible information. If you have questions about this medicine, talk to your doctor, pharmacist, or health care provider.  2015, [employer]. (2012-08-07 11:08:42) Pantoprazole tablets What is this medicine? PANTOPRAZOLE (pan TOE pra zole) prevents the production of acid in the stomach. It is used to treat gastroesophageal reflux  disease (GERD), inflammation of the esophagus, and Zollinger-Ellison syndrome. This medicine may be used for other purposes; ask your health care provider or pharmacist if you have questions. COMMON BRAND NAME(S): Protonix What should I tell my health care provider  before I take this medicine? They need to know if you have any of these conditions: -liver disease -low levels of magnesium in the blood -an unusual or allergic reaction to omeprazole, lansoprazole, pantoprazole, rabeprazole, other medicines, foods, dyes, or preservatives -pregnant or trying to get pregnant -breast-feeding How should I use this medicine? Take this medicine by mouth. Swallow the tablets whole with a drink of water. Follow the directions on the prescription label. Do not crush, break, or chew. Take your medicine at regular intervals. Do not take your medicine more often than directed. Talk to your pediatrician regarding the use of this medicine in children. While this drug may be prescribed for children as young as 5 years for selected conditions, precautions do apply. Overdosage: If you think you have taken too much of this medicine contact a poison control center or emergency room at once. NOTE: This medicine is only for you. Do not share this medicine with others. What if I miss a dose? If you miss a dose, take it as soon as you can. If it is almost time for your next dose, take only that dose. Do not take double or extra doses. What may interact with this medicine? Do not take this medicine with any of the following medications: -atazanavir -nelfinavir This medicine may also interact with the following medications: -ampicillin -delavirdine -digoxin -diuretics -iron salts -medicines for fungal infections like ketoconazole, itraconazole and voriconazole -warfarin This list may not describe all possible interactions. Give your health care provider a list of all the medicines, herbs, non-prescription drugs, or dietary supplements you use. Also tell them if you smoke, drink alcohol, or use illegal drugs. Some items may interact with your medicine. What should I watch for while using this medicine? It can take several days before your stomach pain gets better. Check with your  doctor or health care professional if your condition does not start to get better, or if it gets worse. You may need blood work done while you are taking this medicine. What side effects may I notice from receiving this medicine? Side effects that you should report to your doctor or health care professional as soon as possible: -allergic reactions like skin rash, itching or hives, swelling of the face, lips, or tongue -bone, muscle or joint pain -breathing problems -chest pain or chest tightness -dark yellow or brown urine -dizziness -fast, irregular heartbeat -feeling faint or lightheaded -fever or sore throat -muscle spasm -palpitations -redness, blistering, peeling or loosening of the skin, including inside the mouth -seizures -tremors -unusual bleeding or bruising -unusually weak or tired -yellowing of the eyes or skin Side effects that usually do not require medical attention (Report these to your doctor or health care professional if they continue or are bothersome.): -constipation -diarrhea -dry mouth -headache -nausea This list may not describe all possible side effects. Call your doctor for medical advice about side effects. You may report side effects to FDA at 1-800-FDA-1088. Where should I keep my medicine? Keep out of the reach of children. Store at room temperature between 15 and 30 degrees C (59 and 86 degrees F). Protect from light and moisture. Throw away any unused medicine after the expiration date. NOTE: This sheet is a summary. It may not cover all possible  information. If you have questions about this medicine, talk to your doctor, pharmacist, or health care provider.  2015, [employer]. (2012-03-07 16:40:16)

## 2015-01-23 NOTE — Progress Notes (Signed)
Clinical Social Work  Patient prefers [facility] for transportation home and reports door is unlocked. HH has been arranged. Patient confirmed address. [facility] forms completed and placed on chart. [facility] request #: [id number].  CSW is signing off but available if needed.  [place], [place] [phone]

## 2015-01-23 NOTE — Progress Notes (Signed)
[id number]/[facility],RN,BSN,CCM both [facility] refused to service patient due to noncompliance by the patient.  Fax to [facility].

## 2015-01-23 NOTE — Discharge Summary (Addendum)
Physician Discharge Summary  [name] WGN:[id number] DOB: 02-Jan-1956 DOA: 01/17/2015  PCP: Barbette Merino, MD  Admit date: 01/17/2015 Discharge date: 01/23/2015  Recommendations for Outpatient Follow-up:  1. You have completed 7 days of antibioics for treatment of cellulitis. No need for antibiotics on discharge. 2. Continue Lantus 10 units daily and novolog 6 units 3 times daily on discharge.  Discharge Diagnoses:  Principal Problem:   Sepsis Active Problems:   Alcohol abuse   HTN (hypertension)   Hepatic encephalopathy   Alcoholic cirrhosis of liver with ascites   Fall   Cellulitis   Lactic acidosis   Severe muscle deconditioning   Weakness generalized   Encounter for palliative care   Anasarca   Other cirrhosis of liver    Discharge Condition: stable   Diet recommendation: as tolerated   History of present illness:  59 y.o. Male with past medical history of alcoholic liver cirrhosis, portal hypertension, varices, ascites, coagulopathy, VDRF, chronic diastolic CHF, type 2 diabetes mellitus, cocaine abuse, alcohol abuse, admissions for hepatic encephalopathy. Patient presented to [facility] long hospital on 01/17/2015 after he was found lying on the floor by family. In the ED, patient was hypothermic with rectal temperature of 95.56F. His blood work was notable for sodium of 132, potassium 5.2, creatinine 1.9, abnormal liver function enzymes, INR 1.5, ammonia 53, platelets 60, hemoglobin 9.3. CT head showed no acute intracranial abnormalities. Chest x-ray was significant for interstitial infiltrates which possibly could represent edema or infection. Patient was admitted to stepdown unit because of bilateral lower extremity cellulitis and electrolyte abnormalities in addition to hepatic encephalopathy. During this hospital stay patient underwent therapeutic paracentesis on 01/19/2015 with 3 L fluid drained. Paracentesis repeated 01/20/2015 with additional 3 L fluid drained. Peritoneal  fluid analysis with no features of SBP. Patient was transferred to floor on 01/20/2015. Palliative care has been consulted for goals of care.  Hospital Course:    Assessment/Plan:    Principal problem Bilateral lower extremity cellulitis  - Onitially on vancomycin and Zosyn. Since blood cultures negative to date changed abx to doxycyline  - Cellulitis improving. Patient has received total of 7 day treatment with abx for cellulitis. - Patient does not need abx on discharge.  Active problems: Anasarca / ascites - Secondary to alcoholic liver cirrhosis - Status post paracentesis 01/19/2015 as well as 01/20/2015 with total of 6 L fluid drained. No acute infection identified on fluid analysis. - Continue Lasix 40 mg daily  Acute hepatic encephalopathy - Related to chronic liver cirrhosis.  - Ammonia level elevated on admission - CT head showed no acute intracranial findings. - Continue rifaximin 550 mg twice daily and lactulose 30 g 3 times daily on discharge   Chronic alcohol liver cirrhosis - Continue folic acid, multivitamin and thiamine - Continue rifaximin and lactulose - Continue Protonix 40 mg daily   Chronic diastolic CHF / Essential hypertension  - Last 2-D echo in March 2016 showed grade 1 diastolic dysfunction with preserved ejection fraction - Compensated. - Continue Lasix 40 mg daily   Acute renal failure / Acute kidney injury on chronic kidney disease, stage III / non-anion gap metabolic acidosis - Worsening renal function on the admission likely reflective of hepatorenal syndrome - Baseline creatinine is around 1.7. Creatinine on this admission 1.9 - Prior ot discharge, Cr 1.5, improved to around baseline values   Hyperkalemia - Likely related to worsening renal insufficiency - Normalized with lasix   Hyponatremia - Likely related to fluid overload, anasarca - Sodium level stable  Moderate protein calorie malnutrition / Failure to thrive  - In the  context of chronic liver cirrhosis, chronic illness - Continue nutritional supplementation on discharge.   Diabetes mellitus type 2 with renal manifestations - CBG's in 190's range - It is reasonable to resume Lantus 10 units daily and NovoLog 6 units 3 times daily on discharge. Prior to admission patient was on Lantus 20 units but we changed to 10 units because CBGs although high there are not too terrible so 10 units a day long acting along with TID AC insulin will probably be good.  COPD - Stable respiratory status.   Leukopenia / anemia of chronic disease / thrombocytopenia - Pancytopenia likely secondary to bone marrow suppression from history of chronic liver cirrhosis  Generalized weakness - Per PT - HHPT recommended; orders placed     DVT Prophylaxis  - SCD's bilaterally in hospital    Code Status: Full.  Family Communication: plan of care discussed with the patient   IV access:  Peripheral IV  Procedures and diagnostic studies:   Ct Head Wo Contrast 01/17/2015 1. No acute intracranial abnormalities. 2. Mild cerebral atrophy with mild chronic microvascular ischemic changes in the cerebral white matter. Electronically Signed By: Vinnie Langton M.D. On: 01/17/2015 14:30   Dg Chest Port 1 View 01/20/2015 Mild overall improvement in pulmonary aeration with mild right lower lobe atelectasis. No consolidation. Electronically Signed By: Richardean Sale M.D. On: 01/20/2015 13:17   Dg Chest Port 1 View 01/17/2015 Coarsened interstitial markings in the mid to lower lungs new since previous exam question interstitial infiltrates which could represent edema or infection. Electronically Signed By: Lavonia Dana M.D. On: 01/17/2015 12:28   Therapeutic paracentesis by CCM on 8/29 and 8/30: 3 L removed  Medical Consultants:  [facility] Palliative care  Other Consultants:  Physical therapy  Nutrition   IAnti-Infectives:   Zosyn 8/27 -->  8/30 Vancomycin 8/27 --> 8/30 Oral doxycycline 8/30 --> 01/23/2015   Signed:  Leisa Lenz, MD  [facility] 01/23/2015, 10:20 AM  Pager #: [phone]  Time spent in minutes: more than 30 minutes    Discharge Exam: Filed Vitals:   01/23/15 0643  BP: 126/50  Pulse: 102  Temp: 98.6 F (37 C)  Resp: 18   Filed Vitals:   01/22/15 2022 01/22/15 2126 01/23/15 0643 01/23/15 0844  BP:  127/60 126/50   Pulse: 96 94 102   Temp:  98.5 F (36.9 C) 98.6 F (37 C)   TempSrc:  Oral Oral   Resp: 18 18 18    Height:      Weight:      SpO2: 99% 100% 97% 98%    General: Pt is alert, follows commands appropriately, not in acute distress Cardiovascular: Regular rate and rhythm, S1/S2 + Respiratory: bilateral air entry, no wheezing  Abdominal: distended, non tender, (+) BS Extremities: lower extremity edema, pulses palpable  Neuro: Grossly nonfocal  Discharge Instructions  Discharge Instructions    Call MD for:  difficulty breathing, headache or visual disturbances    Complete by:  As directed      Call MD for:  persistant dizziness or light-headedness    Complete by:  As directed      Call MD for:  persistant nausea and vomiting    Complete by:  As directed      Call MD for:  severe uncontrolled pain    Complete by:  As directed      Diet - low sodium heart healthy  Complete by:  As directed      Discharge instructions    Complete by:  As directed   1. You have completed 7 days of antibioics for treatment of cellulitis. No need for antibiotics on discharge. 2. Continue Lantus 10 units daily and novolog 6 units 3 times daily on discharge.     Increase activity slowly    Complete by:  As directed             Medication List    TAKE these medications        albuterol (2.5 MG/3ML) 0.083% nebulizer solution  Commonly known as:  PROVENTIL  Take 3 mLs (2.5 mg total) by nebulization every 4 (four) hours as needed for wheezing or shortness of breath.      amitriptyline 25 MG tablet  Commonly known as:  ELAVIL  Take 1 tablet (25 mg total) by mouth at bedtime.     CALCIUM-VITAMIN D PO  Take 100 Units by mouth daily.     clonazePAM 0.25 MG disintegrating tablet  Commonly known as:  KLONOPIN  Take 1 tablet (0.25 mg total) by mouth 2 (two) times daily.     collagenase ointment  Commonly known as:  SANTYL  Apply topically daily.     diclofenac sodium 1 % Gel  Commonly known as:  VOLTAREN  Apply 4 g topically as needed (for back of leg pain).     feeding supplement (PRO-STAT SUGAR FREE 64) Liqd  Take 30 mLs by mouth 2 (two) times daily.     feeding supplement Liqd  Take 1 Container by mouth 3 (three) times daily between meals.     folic acid 1 MG tablet  Commonly known as:  FOLVITE  Take 1 tablet (1 mg total) by mouth daily.     furosemide 40 MG tablet  Commonly known as:  LASIX  Take 1 tablet (40 mg total) by mouth daily.     lactulose 10 GM/15ML solution  Commonly known as:  CHRONULAC  Take 45 mLs (30 g total) by mouth 3 (three) times daily.     LANTUS SOLOSTAR 100 UNIT/ML Solostar Pen  Generic drug:  Insulin Glargine  Inject 10 Units into the skin daily at 10 pm.     magnesium oxide 400 (241.3 MG) MG tablet  Commonly known as:  MAG-OX  Take 1 tablet (400 mg total) by mouth 2 (two) times daily.     methocarbamol 500 MG tablet  Commonly known as:  ROBAXIN  Take 2 tablets (1,000 mg total) by mouth every 8 (eight) hours as needed.     multivitamin with minerals Tabs tablet  Take 1 tablet by mouth daily.     NOVOLOG FLEXPEN 100 UNIT/ML FlexPen  Generic drug:  insulin aspart  Inject 6 Units into the skin 3 (three) times daily with meals.     Oxycodone HCl 10 MG Tabs  Take 1 tablet (10 mg total) by mouth 3 (three) times daily.     pantoprazole 40 MG tablet  Commonly known as:  PROTONIX  Take 1 tablet (40 mg total) by mouth daily.     propranolol 20 MG tablet  Commonly known as:  INDERAL  Take 20 mg by mouth 2  (two) times daily.     rifaximin 550 MG Tabs tablet  Commonly known as:  XIFAXAN  Take 1 tablet (550 mg total) by mouth 2 (two) times daily.     spironolactone 50 MG tablet  Commonly known as:  ALDACTONE  Take 1 tablet (50 mg total) by mouth daily.     thiamine 100 MG tablet  Commonly known as:  VITAMIN B-1  Take 100 mg by mouth daily.           Follow-up Information    Follow up with [facility], [place]. Schedule an appointment as soon as possible for a visit in 1 week.   Specialty:  Internal Medicine   Why:  Follow up appt after recent hospitalization   Contact information:   [address] [place] [address] [phone]        The results of significant diagnostics from this hospitalization (including imaging, microbiology, ancillary and laboratory) are listed below for reference.    Significant Diagnostic Studies: Dg Chest 2 View  01/14/2015   CLINICAL DATA:  Initial evaluation for acute left-sided chest pain.  EXAM: CHEST  2 VIEW  COMPARISON:  Prior radiograph from 01/01/2015.  FINDINGS: Cardiomegaly is stable. Mediastinal silhouette within normal limits.  The lungs are normally inflated. No airspace consolidation, pleural effusion, or pulmonary edema is identified. There is no pneumothorax.  No acute osseous abnormality identified.  IMPRESSION: 1. No active cardiopulmonary disease. 2. Stable cardiomegaly.   Electronically Signed   By: Jeannine Boga M.D.   On: 01/14/2015 23:45   Ct Head Wo Contrast  01/17/2015   CLINICAL DATA:  59 year old male found unconscious on floor. Altered mental status.  EXAM: CT HEAD WITHOUT CONTRAST  TECHNIQUE: Contiguous axial images were obtained from the base of the skull through the vertex without intravenous contrast.  COMPARISON:  Head CT 12/2014.  FINDINGS: Mild cerebral atrophy. Patchy and confluent areas of decreased attenuation are noted throughout the deep and periventricular white matter of the cerebral hemispheres  bilaterally, compatible with chronic microvascular ischemic disease. No acute intracranial abnormalities. Specifically, no evidence of acute intracranial hemorrhage, no definite findings of acute/subacute cerebral ischemia, no mass, mass effect, hydrocephalus or abnormal intra or extra-axial fluid collections. Visualized paranasal sinuses and mastoids are well pneumatized. No acute displaced skull fractures are identified.  IMPRESSION: 1. No acute intracranial abnormalities. 2. Mild cerebral atrophy with mild chronic microvascular ischemic changes in the cerebral white matter.   Electronically Signed   By: Vinnie Langton M.D.   On: 01/17/2015 14:30   Dg Chest Port 1 View  01/20/2015   CLINICAL DATA:  Chest pain. History of hypertension, diabetes and COPD.  EXAM: PORTABLE CHEST - 1 VIEW  COMPARISON:  01/17/2015 and 01/14/2015.  FINDINGS: 1220 hours. The heart size and mediastinal contours are stable. There are persistent low lung volumes with asymmetric right basilar atelectasis. Overall pulmonary aeration has improved. No edema or confluent airspace opacity identified. There is no pleural effusion or pneumothorax. Telemetry leads overlie the chest.  IMPRESSION: Mild overall improvement in pulmonary aeration with mild right lower lobe atelectasis. No consolidation.   Electronically Signed   By: Richardean Sale M.D.   On: 01/20/2015 13:17   Dg Chest Port 1 View  01/17/2015   CLINICAL DATA:  BILATERAL lower extremity swelling for 2-3 days, LEFT rib pain, shortness of breath, hypertension, diabetes mellitus, COPD, smoker  EXAM: PORTABLE CHEST - 1 VIEW  COMPARISON:  Portable exam 1152 hours compared to 01/14/2015  FINDINGS: Upper normal size of cardiac silhouette.  Rotated to the LEFT.  Mediastinal contours normal.  Coarsened interstitial markings in the mid to lower lungs question infiltrate.  No pleural effusion or pneumothorax.  Bones unremarkable.  IMPRESSION: Coarsened interstitial markings in the mid to  lower lungs  new since previous exam question interstitial infiltrates which could represent edema or infection.   Electronically Signed   By: Lavonia Dana M.D.   On: 01/17/2015 12:28    Microbiology: Recent Results (from the past 240 hour(s))  Culture, Urine     Status: None   Collection Time: 01/17/15 12:30 PM  Result Value Ref Range Status   Specimen Description URINE, CATHETERIZED  Final   Special Requests NONE  Final   Culture   Final    NO GROWTH 1 DAY Performed at [facility]    Report Status 01/19/2015 FINAL  Final  MRSA PCR Screening     Status: Abnormal   Collection Time: 01/17/15  4:50 PM  Result Value Ref Range Status   MRSA by PCR POSITIVE (A) NEGATIVE Final    Comment:        The GeneXpert MRSA Assay (FDA approved for NASAL specimens only), is one component of a comprehensive MRSA colonization surveillance program. It is not intended to diagnose MRSA infection nor to guide or monitor treatment for MRSA infections. RESULT CALLED TO, READ BACK BY AND VERIFIED WITH: G.GARLAND,RN AT 2040 ON 01/17/15 BY W.SHEA   Culture, blood (routine x 2)     Status: None (Preliminary result)   Collection Time: 01/17/15  6:15 PM  Result Value Ref Range Status   Specimen Description BLOOD RIGHT ARM  Final   Special Requests BOTTLES DRAWN AEROBIC ONLY [place]  Final   Culture   Final    NO GROWTH 4 DAYS Performed at [facility]    Report Status PENDING  Incomplete  Culture, blood (routine x 2)     Status: None (Preliminary result)   Collection Time: 01/17/15  6:20 PM  Result Value Ref Range Status   Specimen Description BLOOD LEFT HAND  Final   Special Requests BOTTLES DRAWN AEROBIC ONLY 10CC  Final   Culture   Final    NO GROWTH 4 DAYS Performed at [facility]    Report Status PENDING  Incomplete  Body fluid culture     Status: None   Collection Time: 01/19/15 11:06 AM  Result Value Ref Range Status   Specimen Description PERITONEAL  Final   Special  Requests NONE  Final   Gram Stain   Final    FEW WBC PRESENT,BOTH PMN AND MONONUCLEAR NO ORGANISMS SEEN    Culture   Final    NO GROWTH 3 DAYS Performed at [facility]    Report Status 01/22/2015 FINAL  Final     Labs: Basic Metabolic Panel:  Recent Labs Lab 01/17/15 1135 01/18/15 0350 01/19/15 0404 01/20/15 0330 01/21/15 0605 01/22/15 0719  NA 132* 133* 132* 133* 131* 129*  K 5.2* 5.2* 4.2 4.1 3.9 3.8  CL 106 109 105 106 105 104  CO2 19* 19* 20* 20* 19* 19*  GLUCOSE 131* 109* 147* 146* 141* 137*  BUN 56* 60* 54* 50* 45* 46*  CREATININE 1.92* 2.03* 1.90* 1.76* 1.64* 1.53*  CALCIUM 8.1* 7.9* 7.8* 7.8* 7.6* 7.8*  MG 2.1  --   --   --   --   --    Liver Function Tests:  Recent Labs Lab 01/17/15 1135 01/18/15 0350 01/19/15 0404 01/20/15 0330 01/21/15 0605  AST 89* 73* 73* 64* 68*  ALT 45 37 37 36 37  ALKPHOS 155* 119 117 123 128*  BILITOT 3.2* 2.9* 2.3* 1.9* 2.3*  PROT 6.1* 5.1* 5.0* 4.9* 5.2*  ALBUMIN 2.5* 2.1* 2.1*  2.4* 2.4*   No results for input(s): LIPASE, AMYLASE in the last 168 hours.  Recent Labs Lab 01/17/15 1130 01/19/15 0404  AMMONIA 53* 22   CBC:  Recent Labs Lab 01/18/15 0350 01/19/15 0404 01/20/15 0330 01/21/15 0605 01/22/15 0719  WBC 4.9 4.1 3.6* 5.1 4.7  HGB 7.6* 7.5* 7.2* 8.5* 8.3*  HCT 22.9* 23.4* 21.8* 25.2* 24.9*  MCV 92.0 92.5 93.6 92.6 92.2  PLT 57* 57* 52* 59* 72*   Cardiac Enzymes:  Recent Labs Lab 01/17/15 1130  CKTOTAL 170   BNP: BNP (last 3 results)  Recent Labs  11/29/14 1145 01/15/15 0010 01/17/15 1815  BNP 29.4 46.4 75.3    ProBNP (last 3 results)  Recent Labs  02/08/14 1830 02/10/14 1547  PROBNP 966.3* 805.5*    CBG:  Recent Labs Lab 01/22/15 0726 01/22/15 1143 01/22/15 1628 01/22/15 2114 01/23/15 0752  GLUCAP 121* 193* 190* 190* 162*

## 2015-01-25 ENCOUNTER — Emergency Department (HOSPITAL_COMMUNITY): Payer: Medicare PPO

## 2015-01-25 ENCOUNTER — Inpatient Hospital Stay (HOSPITAL_COMMUNITY)
Admission: EM | Admit: 2015-01-25 | Discharge: 2015-02-02 | DRG: 871 | Disposition: A | Discharge status: Skilled Nursing Facility | Payer: Medicare PPO | Attending: Internal Medicine | Admitting: Internal Medicine

## 2015-01-25 ENCOUNTER — Encounter (HOSPITAL_COMMUNITY): Payer: Self-pay | Admitting: *Deleted

## 2015-01-25 DIAGNOSIS — E1165 Type 2 diabetes mellitus with hyperglycemia: Secondary | ICD-10-CM | POA: Diagnosis present

## 2015-01-25 DIAGNOSIS — K7030 Alcoholic cirrhosis of liver without ascites: Secondary | ICD-10-CM | POA: Diagnosis present

## 2015-01-25 DIAGNOSIS — G9340 Encephalopathy, unspecified: Secondary | ICD-10-CM | POA: Diagnosis not present

## 2015-01-25 DIAGNOSIS — I129 Hypertensive chronic kidney disease with stage 1 through stage 4 chronic kidney disease, or unspecified chronic kidney disease: Secondary | ICD-10-CM | POA: Diagnosis present

## 2015-01-25 DIAGNOSIS — F314 Bipolar disorder, current episode depressed, severe, without psychotic features: Secondary | ICD-10-CM | POA: Diagnosis not present

## 2015-01-25 DIAGNOSIS — N508 Other specified disorders of male genital organs: Secondary | ICD-10-CM | POA: Diagnosis present

## 2015-01-25 DIAGNOSIS — E43 Unspecified severe protein-calorie malnutrition: Secondary | ICD-10-CM | POA: Diagnosis present

## 2015-01-25 DIAGNOSIS — E872 Acidosis: Secondary | ICD-10-CM | POA: Diagnosis present

## 2015-01-25 DIAGNOSIS — Z515 Encounter for palliative care: Secondary | ICD-10-CM | POA: Diagnosis not present

## 2015-01-25 DIAGNOSIS — I10 Essential (primary) hypertension: Secondary | ICD-10-CM | POA: Diagnosis present

## 2015-01-25 DIAGNOSIS — E871 Hypo-osmolality and hyponatremia: Secondary | ICD-10-CM | POA: Diagnosis present

## 2015-01-25 DIAGNOSIS — D61818 Other pancytopenia: Secondary | ICD-10-CM | POA: Diagnosis present

## 2015-01-25 DIAGNOSIS — F17210 Nicotine dependence, cigarettes, uncomplicated: Secondary | ICD-10-CM | POA: Diagnosis present

## 2015-01-25 DIAGNOSIS — F1010 Alcohol abuse, uncomplicated: Secondary | ICD-10-CM | POA: Diagnosis present

## 2015-01-25 DIAGNOSIS — I959 Hypotension, unspecified: Secondary | ICD-10-CM | POA: Diagnosis not present

## 2015-01-25 DIAGNOSIS — R339 Retention of urine, unspecified: Secondary | ICD-10-CM | POA: Diagnosis present

## 2015-01-25 DIAGNOSIS — K766 Portal hypertension: Secondary | ICD-10-CM | POA: Diagnosis present

## 2015-01-25 DIAGNOSIS — D509 Iron deficiency anemia, unspecified: Secondary | ICD-10-CM | POA: Diagnosis present

## 2015-01-25 DIAGNOSIS — N183 Chronic kidney disease, stage 3 (moderate): Secondary | ICD-10-CM | POA: Diagnosis present

## 2015-01-25 DIAGNOSIS — Z8673 Personal history of transient ischemic attack (TIA), and cerebral infarction without residual deficits: Secondary | ICD-10-CM

## 2015-01-25 DIAGNOSIS — J449 Chronic obstructive pulmonary disease, unspecified: Secondary | ICD-10-CM | POA: Diagnosis present

## 2015-01-25 DIAGNOSIS — N170 Acute kidney failure with tubular necrosis: Secondary | ICD-10-CM | POA: Diagnosis present

## 2015-01-25 DIAGNOSIS — F3130 Bipolar disorder, current episode depressed, mild or moderate severity, unspecified: Secondary | ICD-10-CM | POA: Diagnosis present

## 2015-01-25 DIAGNOSIS — R519 Headache, unspecified: Secondary | ICD-10-CM

## 2015-01-25 DIAGNOSIS — D6959 Other secondary thrombocytopenia: Secondary | ICD-10-CM | POA: Diagnosis present

## 2015-01-25 DIAGNOSIS — A419 Sepsis, unspecified organism: Principal | ICD-10-CM | POA: Diagnosis present

## 2015-01-25 DIAGNOSIS — Z7189 Other specified counseling: Secondary | ICD-10-CM | POA: Insufficient documentation

## 2015-01-25 DIAGNOSIS — E1142 Type 2 diabetes mellitus with diabetic polyneuropathy: Secondary | ICD-10-CM | POA: Diagnosis present

## 2015-01-25 DIAGNOSIS — K7290 Hepatic failure, unspecified without coma: Secondary | ICD-10-CM | POA: Diagnosis present

## 2015-01-25 DIAGNOSIS — J690 Pneumonitis due to inhalation of food and vomit: Secondary | ICD-10-CM | POA: Diagnosis present

## 2015-01-25 DIAGNOSIS — R627 Adult failure to thrive: Secondary | ICD-10-CM | POA: Diagnosis present

## 2015-01-25 DIAGNOSIS — Z794 Long term (current) use of insulin: Secondary | ICD-10-CM | POA: Diagnosis not present

## 2015-01-25 DIAGNOSIS — G8929 Other chronic pain: Secondary | ICD-10-CM | POA: Diagnosis present

## 2015-01-25 DIAGNOSIS — Z66 Do not resuscitate: Secondary | ICD-10-CM | POA: Diagnosis present

## 2015-01-25 DIAGNOSIS — R338 Other retention of urine: Secondary | ICD-10-CM

## 2015-01-25 DIAGNOSIS — K219 Gastro-esophageal reflux disease without esophagitis: Secondary | ICD-10-CM | POA: Diagnosis present

## 2015-01-25 DIAGNOSIS — D631 Anemia in chronic kidney disease: Secondary | ICD-10-CM | POA: Diagnosis present

## 2015-01-25 DIAGNOSIS — N179 Acute kidney failure, unspecified: Secondary | ICD-10-CM | POA: Diagnosis present

## 2015-01-25 DIAGNOSIS — Z9114 Patient's other noncompliance with medication regimen: Secondary | ICD-10-CM | POA: Diagnosis present

## 2015-01-25 DIAGNOSIS — M1990 Unspecified osteoarthritis, unspecified site: Secondary | ICD-10-CM | POA: Diagnosis present

## 2015-01-25 DIAGNOSIS — E1122 Type 2 diabetes mellitus with diabetic chronic kidney disease: Secondary | ICD-10-CM | POA: Diagnosis present

## 2015-01-25 DIAGNOSIS — K7031 Alcoholic cirrhosis of liver with ascites: Secondary | ICD-10-CM | POA: Diagnosis present

## 2015-01-25 DIAGNOSIS — I5032 Chronic diastolic (congestive) heart failure: Secondary | ICD-10-CM | POA: Diagnosis present

## 2015-01-25 DIAGNOSIS — K922 Gastrointestinal hemorrhage, unspecified: Secondary | ICD-10-CM | POA: Diagnosis present

## 2015-01-25 DIAGNOSIS — F439 Reaction to severe stress, unspecified: Secondary | ICD-10-CM | POA: Diagnosis present

## 2015-01-25 DIAGNOSIS — K2270 Barrett's esophagus without dysplasia: Secondary | ICD-10-CM | POA: Diagnosis present

## 2015-01-25 DIAGNOSIS — Z9119 Patient's noncompliance with other medical treatment and regimen: Secondary | ICD-10-CM | POA: Diagnosis present

## 2015-01-25 DIAGNOSIS — D696 Thrombocytopenia, unspecified: Secondary | ICD-10-CM | POA: Diagnosis present

## 2015-01-25 DIAGNOSIS — J438 Other emphysema: Secondary | ICD-10-CM | POA: Insufficient documentation

## 2015-01-25 DIAGNOSIS — F331 Major depressive disorder, recurrent, moderate: Secondary | ICD-10-CM | POA: Diagnosis present

## 2015-01-25 DIAGNOSIS — E440 Moderate protein-calorie malnutrition: Secondary | ICD-10-CM | POA: Insufficient documentation

## 2015-01-25 DIAGNOSIS — R51 Headache: Secondary | ICD-10-CM

## 2015-01-25 DIAGNOSIS — R601 Generalized edema: Secondary | ICD-10-CM | POA: Diagnosis present

## 2015-01-25 DIAGNOSIS — R531 Weakness: Secondary | ICD-10-CM | POA: Insufficient documentation

## 2015-01-25 DIAGNOSIS — F329 Major depressive disorder, single episode, unspecified: Secondary | ICD-10-CM | POA: Diagnosis present

## 2015-01-25 LAB — CBC WITH DIFFERENTIAL/PLATELET
BASOS ABS: 0.1 10*3/uL (ref 0.0–0.1)
Basophils Relative: 1 % (ref 0–1)
EOS PCT: 6 % — AB (ref 0–5)
Eosinophils Absolute: 0.3 10*3/uL (ref 0.0–0.7)
HCT: 25.1 % — ABNORMAL LOW (ref 39.0–52.0)
Hemoglobin: 8.3 g/dL — ABNORMAL LOW (ref 13.0–17.0)
LYMPHS ABS: 0.6 10*3/uL — AB (ref 0.7–4.0)
Lymphocytes Relative: 10 % — ABNORMAL LOW (ref 12–46)
MCH: 30.4 pg (ref 26.0–34.0)
MCHC: 33.1 g/dL (ref 30.0–36.0)
MCV: 91.9 fL (ref 78.0–100.0)
MONO ABS: 0.6 10*3/uL (ref 0.1–1.0)
Monocytes Relative: 11 % (ref 3–12)
Neutro Abs: 4.1 10*3/uL (ref 1.7–7.7)
Neutrophils Relative %: 72 % (ref 43–77)
PLATELETS: 51 10*3/uL — AB (ref 150–400)
RBC: 2.73 MIL/uL — AB (ref 4.22–5.81)
RDW: 21.4 % — ABNORMAL HIGH (ref 11.5–15.5)
WBC: 5.7 10*3/uL (ref 4.0–10.5)

## 2015-01-25 LAB — COMPREHENSIVE METABOLIC PANEL
ALT: 54 U/L (ref 17–63)
AST: 105 U/L — ABNORMAL HIGH (ref 15–41)
Albumin: 2.1 g/dL — ABNORMAL LOW (ref 3.5–5.0)
Alkaline Phosphatase: 148 U/L — ABNORMAL HIGH (ref 38–126)
Anion gap: 6 (ref 5–15)
BUN: 53 mg/dL — ABNORMAL HIGH (ref 6–20)
CHLORIDE: 105 mmol/L (ref 101–111)
CO2: 18 mmol/L — AB (ref 22–32)
CREATININE: 2.07 mg/dL — AB (ref 0.61–1.24)
Calcium: 8.1 mg/dL — ABNORMAL LOW (ref 8.9–10.3)
GFR calc non Af Amer: 33 mL/min — ABNORMAL LOW (ref >60)
GFR, EST AFRICAN AMERICAN: 39 mL/min — AB (ref >60)
Glucose, Bld: 170 mg/dL — ABNORMAL HIGH (ref 65–99)
POTASSIUM: 3.7 mmol/L (ref 3.5–5.1)
SODIUM: 129 mmol/L — AB (ref 135–145)
Total Bilirubin: 2.4 mg/dL — ABNORMAL HIGH (ref 0.3–1.2)
Total Protein: 5.1 g/dL — ABNORMAL LOW (ref 6.5–8.1)

## 2015-01-25 LAB — LACTATE DEHYDROGENASE, PLEURAL OR PERITONEAL FLUID: LD, Fluid: 22 U/L (ref 3–23)

## 2015-01-25 LAB — RAPID URINE DRUG SCREEN, HOSP PERFORMED
Amphetamines: NOT DETECTED
BENZODIAZEPINES: POSITIVE — AB
Barbiturates: NOT DETECTED
COCAINE: NOT DETECTED
Opiates: POSITIVE — AB
Tetrahydrocannabinol: NOT DETECTED

## 2015-01-25 LAB — PROTEIN, BODY FLUID

## 2015-01-25 LAB — URINALYSIS, ROUTINE W REFLEX MICROSCOPIC
BILIRUBIN URINE: NEGATIVE
Glucose, UA: NEGATIVE mg/dL
HGB URINE DIPSTICK: NEGATIVE
KETONES UR: NEGATIVE mg/dL
Leukocytes, UA: NEGATIVE
Nitrite: NEGATIVE
PROTEIN: NEGATIVE mg/dL
Specific Gravity, Urine: 1.015 (ref 1.005–1.030)
UROBILINOGEN UA: 0.2 mg/dL (ref 0.0–1.0)
pH: 5 (ref 5.0–8.0)

## 2015-01-25 LAB — I-STAT CG4 LACTIC ACID, ED
LACTIC ACID, VENOUS: 2.35 mmol/L — AB (ref 0.5–2.0)
LACTIC ACID, VENOUS: 1.71 mmol/L (ref 0.5–2.0)

## 2015-01-25 LAB — ALBUMIN, FLUID (OTHER)

## 2015-01-25 LAB — ETHANOL: Alcohol, Ethyl (B): <5 mg/dL (ref <5)

## 2015-01-25 LAB — GLUCOSE, PERITONEAL FLUID: GLUCOSE, PERITONEAL FLUID: 162 mg/dL

## 2015-01-25 LAB — CBG MONITORING, ED: GLUCOSE-CAPILLARY: 135 mg/dL — AB (ref 65–99)

## 2015-01-25 LAB — AMMONIA: AMMONIA: 89 umol/L — AB (ref 9–35)

## 2015-01-25 LAB — LIPASE, BLOOD: Lipase: 30 U/L (ref 22–51)

## 2015-01-25 MED ORDER — OXYCODONE HCL 5 MG PO TABS
10.0000 mg | ORAL_TABLET | Freq: Three times a day (TID) | ORAL | Status: DC
  Administered 2015-01-26 – 2015-01-28 (×8): 10 mg via ORAL
  Filled 2015-01-25 (×17): qty 2

## 2015-01-25 MED ORDER — INSULIN ASPART 100 UNIT/ML ~~LOC~~ SOLN: 0.0000 [IU] | SUBCUTANEOUS | Status: DC

## 2015-01-25 MED ORDER — COLLAGENASE 250 UNIT/GM EX OINT
TOPICAL_OINTMENT | Freq: Every day | CUTANEOUS | Status: DC
  Administered 2015-01-26 – 2015-02-01 (×5): via TOPICAL
  Filled 2015-01-25 (×2): qty 30

## 2015-01-25 MED ORDER — MAGNESIUM OXIDE 400 (241.3 MG) MG PO TABS
400.0000 mg | ORAL_TABLET | Freq: Two times a day (BID) | ORAL | Status: DC
  Administered 2015-01-26 – 2015-02-02 (×16): 400 mg via ORAL
  Filled 2015-01-25 (×16): qty 1

## 2015-01-25 MED ORDER — SPIRONOLACTONE 50 MG PO TABS
50.0000 mg | ORAL_TABLET | Freq: Every day | ORAL | Status: DC
  Filled 2015-01-25: qty 2
  Filled 2015-01-25: qty 1

## 2015-01-25 MED ORDER — FUROSEMIDE 10 MG/ML IJ SOLN
10.0000 mg/h | INTRAVENOUS | Status: DC
  Administered 2015-01-25: 10 mg/h via INTRAVENOUS
  Filled 2015-01-25 (×2): qty 25

## 2015-01-25 MED ORDER — LACTULOSE 10 GM/15ML PO SOLN
30.0000 g | Freq: Three times a day (TID) | ORAL | Status: DC
  Administered 2015-01-26 – 2015-02-01 (×10): 30 g via ORAL
  Filled 2015-01-25 (×15): qty 45

## 2015-01-25 MED ORDER — DEXTROSE 5 % IV SOLN
1.0000 g | INTRAVENOUS | Status: DC
  Administered 2015-01-25 – 2015-02-01 (×8): 1 g via INTRAVENOUS
  Filled 2015-01-25 (×10): qty 10

## 2015-01-25 MED ORDER — HYDROMORPHONE HCL 1 MG/ML IJ SOLN
0.5000 mg | Freq: Once | INTRAMUSCULAR | Status: AC
  Administered 2015-01-25: 0.5 mg via INTRAVENOUS
  Filled 2015-01-25: qty 1

## 2015-01-25 MED ORDER — METHOCARBAMOL 500 MG PO TABS: 1000.0000 mg | ORAL_TABLET | Freq: Three times a day (TID) | ORAL | Status: DC | PRN

## 2015-01-25 MED ORDER — AMITRIPTYLINE HCL 25 MG PO TABS
25.0000 mg | ORAL_TABLET | Freq: Every day | ORAL | Status: DC
  Administered 2015-01-26 – 2015-01-27 (×2): 25 mg via ORAL
  Filled 2015-01-25 (×4): qty 1

## 2015-01-25 MED ORDER — PIPERACILLIN-TAZOBACTAM 3.375 G IVPB 30 MIN
3.3750 g | Freq: Once | INTRAVENOUS | Status: AC
  Administered 2015-01-25: 3.375 g via INTRAVENOUS
  Filled 2015-01-25: qty 50

## 2015-01-25 MED ORDER — PRO-STAT SUGAR FREE PO LIQD
30.0000 mL | Freq: Two times a day (BID) | ORAL | Status: DC
Start: 2015-01-25 — End: 2015-02-02
  Administered 2015-01-26 – 2015-02-02 (×14): 30 mL via ORAL
  Filled 2015-01-25 (×14): qty 30

## 2015-01-25 MED ORDER — BOOST / RESOURCE BREEZE PO LIQD
1.0000 | Freq: Three times a day (TID) | ORAL | Status: DC
  Administered 2015-01-27 – 2015-02-01 (×15): 1 via ORAL
  Filled 2015-01-25 (×2): qty 1

## 2015-01-25 MED ORDER — FOLIC ACID 1 MG PO TABS
1.0000 mg | ORAL_TABLET | Freq: Every day | ORAL | Status: DC
  Administered 2015-01-26 – 2015-02-02 (×9): 1 mg via ORAL
  Filled 2015-01-25 (×9): qty 1

## 2015-01-25 MED ORDER — PANTOPRAZOLE SODIUM 40 MG PO TBEC
40.0000 mg | DELAYED_RELEASE_TABLET | Freq: Every day | ORAL | Status: DC
  Administered 2015-01-26 – 2015-02-02 (×9): 40 mg via ORAL
  Filled 2015-01-25 (×9): qty 1

## 2015-01-25 MED ORDER — ALBUMIN HUMAN 25 % IV SOLN
75.0000 g | Freq: Once | INTRAVENOUS | Status: AC
  Administered 2015-01-26: 75 g via INTRAVENOUS
  Filled 2015-01-25 (×2): qty 300

## 2015-01-25 MED ORDER — ALBUTEROL SULFATE (2.5 MG/3ML) 0.083% IN NEBU: 2.5000 mg | INHALATION_SOLUTION | RESPIRATORY_TRACT | Status: DC | PRN

## 2015-01-25 MED ORDER — VANCOMYCIN HCL 10 G IV SOLR
2000.0000 mg | Freq: Once | INTRAVENOUS | Status: AC
  Administered 2015-01-25: 2000 mg via INTRAVENOUS
  Filled 2015-01-25: qty 2000

## 2015-01-25 MED ORDER — ASPIRIN EC 81 MG PO TBEC
81.0000 mg | DELAYED_RELEASE_TABLET | Freq: Every day | ORAL | Status: DC
  Administered 2015-01-26 – 2015-01-28 (×4): 81 mg via ORAL
  Filled 2015-01-25 (×4): qty 1

## 2015-01-25 MED ORDER — DICLOFENAC SODIUM 1 % TD GEL
4.0000 g | Freq: Four times a day (QID) | TRANSDERMAL | Status: DC | PRN
  Administered 2015-01-26: 4 g via TOPICAL
  Filled 2015-01-25: qty 100

## 2015-01-25 MED ORDER — VITAMIN B-1 100 MG PO TABS
100.0000 mg | ORAL_TABLET | Freq: Every day | ORAL | Status: DC
  Administered 2015-01-26 – 2015-02-02 (×9): 100 mg via ORAL
  Filled 2015-01-25 (×9): qty 1

## 2015-01-25 MED ORDER — RIFAXIMIN 550 MG PO TABS
550.0000 mg | ORAL_TABLET | Freq: Two times a day (BID) | ORAL | Status: DC
  Administered 2015-01-26 – 2015-02-02 (×16): 550 mg via ORAL
  Filled 2015-01-25 (×18): qty 1

## 2015-01-25 MED ORDER — CLONAZEPAM 0.5 MG PO TABS
0.2500 mg | ORAL_TABLET | Freq: Two times a day (BID) | ORAL | Status: DC
  Administered 2015-01-26 – 2015-01-28 (×6): 0.25 mg via ORAL
  Filled 2015-01-25 (×6): qty 1

## 2015-01-25 MED ORDER — PROPRANOLOL HCL 20 MG PO TABS
20.0000 mg | ORAL_TABLET | Freq: Two times a day (BID) | ORAL | Status: DC
  Administered 2015-01-26: 20 mg via ORAL
  Filled 2015-01-25 (×3): qty 1

## 2015-01-25 NOTE — ED Notes (Signed)
MD unable to drain fluid from peritoneum; cultures obtained

## 2015-01-25 NOTE — ED Notes (Signed)
Consent signed for paracentesis

## 2015-01-25 NOTE — ED Notes (Signed)
Attempted report 

## 2015-01-25 NOTE — H&P (Signed)
[facility] History and Physical  [name] ZJI:[id number] DOB: April 18, 1956 DOA: 01/25/2015   PCP: Barbette Merino, MD    Chief Complaint: Advanced Liver Cirrhosis with Anasarca  HPI: [name] is a 59 y.o. WM PMHx Depression, Bipolar affect, Alcohol Abuse, Cocaine abuse,   Cirrhosis of liver on transplant list with Chronic Anasarca/Ascites, Diabetes Mellitus, Hypertension, COPD, CVA, Chronic pancytopenia, CKD(baseline Cr 1.6-2.0),  GERD. Frequent flyer who's just discharged on 01/23/2015 for the same below complaints.  Exam above notable for elderly chronically ill-appearing male lying in stretcher tearful and in mild to moderate distress secondary to pain. Afebrile. Heart rate 90s. Hypotensive. Abdomen distended. Fluid wave and tympanitic. 2 previous paracenteses sites noted 1 to left and 1 to right abdomen - active drainage of clear fluid from left puncture site. Abdomen diffusely tender. Spider angiomata noted over her abdomen and chest. Abdominal striae also noted. Scleral icterus and sublingual icterus noted. 2+ pitting edema to lower extremities. Large amount of edema and overlying erythema of the scrotum - no obvious open wound. Alert and oriented 3. Given Hypotension and borderline tachycardia with mildly decreased temperature as well as diffuse abdominal pain and moderate to severe edema and erythema of the scrotum and lower extremities, concern for sepsis. Blood and urine cultures collected. Lactic acid 2.35. Broad-spectrum antibiotics initiated with vancomycin and Zosyn. Patient stated was having increased abdominal pain, testicular pain, lower extremity pain. States lives at home alone and now understands he is incapable of taking care of himself.     Review of Systems: The patient denies anorexia, fever, weight loss,, vision loss, decreased hearing, hoarseness, syncope, balance deficits, hemoptysis, melena, hematochezia, severe indigestion/heartburn, hematuria, incontinence,  genital sores, muscle weakness, suspicious skin lesions, transient blindness,    abnormal bleeding, enlarged lymph nodes, angioedema, and breast masses.    TRAVEL HISTORY: NA   Consultants:   NA Procedure/Significant Events:  9/4 Paracentesis by ED pending    Culture     Antibiotics:  Zosyn 9/4 for 1 dose Vancomycin 9/4 for 1 dose Ceftriaxone 9/4>>  DVT prophylaxis:  SCD  Devices     LINES / TUBES:     Past Medical History  Diagnosis Date  . Neuropathy   . Diabetes mellitus   . Bipolar affect, depressed   . Hypertension   . Arthritis   . Stroke     Mini stroke about 69yrs ago  . Cirrhosis   . Alcohol abuse   . Chronic pain   . Cocaine abuse   . Muscle spasm     both legs  . Encephalopathy, hepatic   . Detached retina   . COPD (chronic obstructive pulmonary disease)     emphysema  . Bronchitis   . Barrett's esophagus   . GERD (gastroesophageal reflux disease)     has ulcer  . Anemia    Past Surgical History  Procedure Laterality Date  . Fracture surgery      Leg and arm 80yrs ago  . Esophagogastroduodenoscopy  04/04/2012    Procedure: ESOPHAGOGASTRODUODENOSCOPY (EGD);  Surgeon: Irene Shipper, MD;  Location: [facility] ENDOSCOPY;  Service: Endoscopy;  Laterality: N/A;  . Esophagogastroduodenoscopy Left 03/13/2013    Procedure: ESOPHAGOGASTRODUODENOSCOPY (EGD);  Surgeon: Arta Silence, MD;  Location: [facility] ENDOSCOPY;  Service: Endoscopy;  Laterality: Left;  Marland Kitchen Eye surgery  8 months ago both eyes    cataracts both eyes, detached eye, gas pocket  . Vasectomy    . Pars plana vitrectomy Left 07/08/2013    Procedure: PARS PLANA  VITRECTOMY WITH 25 GAUGE;  Surgeon: Hurman Horn, MD;  Location: [place];  Service: Ophthalmology;  Laterality: Left;  . Intraocular lens removal Left 07/08/2013    Procedure: REMOVAL OF INTRAOCULAR LENS;  Surgeon: Hurman Horn, MD;  Location: [place];  Service: Ophthalmology;  Laterality: Left;  . Placement and suture of secondary intraocular  lens Left 07/08/2013    Procedure: PLACEMENT AND SUTURE OF SECONDARY INTRAOCULAR LENS;  Surgeon: Hurman Horn, MD;  Location: [place];  Service: Ophthalmology;  Laterality: Left;  Insertion of Anterior Capsule Intraocular Lens   . Esophagogastroduodenoscopy N/A 01/16/2014    Procedure: ESOPHAGOGASTRODUODENOSCOPY (EGD);  Surgeon: Winfield Cunas., MD;  Location: [facility] ENDOSCOPY;  Service: Endoscopy;  Laterality: N/A;  . Colonoscopy N/A 01/17/2014    Procedure: COLONOSCOPY;  Surgeon: Winfield Cunas., MD;  Location: [facility] ENDOSCOPY;  Service: Endoscopy;  Laterality: N/A;  possible banding  . Esophagogastroduodenoscopy N/A 08/30/2014    Procedure: ESOPHAGOGASTRODUODENOSCOPY (EGD);  Surgeon: Wilford Corner, MD;  Location: [facility] ENDOSCOPY;  Service: Endoscopy;  Laterality: N/A;  bedside  . Esophagogastroduodenoscopy N/A 11/30/2014    Procedure: ESOPHAGOGASTRODUODENOSCOPY (EGD);  Surgeon: Wilford Corner, MD;  Location: [facility] ENDOSCOPY;  Service: Endoscopy;  Laterality: N/A;  . Esophagogastroduodenoscopy N/A 12/15/2014    Procedure: ESOPHAGOGASTRODUODENOSCOPY (EGD);  Surgeon: Teena Irani, MD;  Location: [facility] ENDOSCOPY;  Service: Endoscopy;  Laterality: N/A;   Social History:  reports that he has been smoking Cigarettes.  He has a 30 pack-year smoking history. He has never used smokeless tobacco. He reports that he drinks alcohol. He reports that he uses illicit drugs (Cocaine). where does patient live--home, ALF, SNF? Lives at home alone   Can patient participate in ADLs? Limited by his scrotal swelling  No Known Allergies  Family History  Problem Relation Age of Onset  . Hypotension Mother   Spoke with patient stated no HTN, MI, HLD, Mental illness, CVA, Thyroid Dz or Cancer in his family history.     Prior to Admission medications   Medication Sig Start Date End Date Taking? Authorizing Provider  albuterol (PROVENTIL) (2.5 MG/3ML) 0.083% nebulizer solution Take 3 mLs (2.5 mg total) by nebulization every 4  (four) hours as needed for wheezing or shortness of breath. 01/23/15  Yes Robbie Lis, MD  Amino Acids-Protein Hydrolys (FEEDING SUPPLEMENT, PRO-STAT SUGAR FREE 64,) LIQD Take 30 mLs by mouth 2 (two) times daily. 01/23/15  Yes Robbie Lis, MD  amitriptyline (ELAVIL) 25 MG tablet Take 1 tablet (25 mg total) by mouth at bedtime. 10/05/14  Yes Nita Sells, MD  CALCIUM-VITAMIN D PO Take 1 tablet by mouth daily.    Yes Historical Provider, MD  clonazePAM (KLONOPIN) 0.25 MG disintegrating tablet Take 1 tablet (0.25 mg total) by mouth 2 (two) times daily. 12/20/14  Yes Thurnell Lose, MD  collagenase (SANTYL) ointment Apply topically daily. 01/23/15  Yes Robbie Lis, MD  diclofenac sodium (VOLTAREN) 1 % GEL Apply 4 g topically as needed (for back of leg pain).   Yes Historical Provider, MD  feeding supplement (BOOST / RESOURCE BREEZE) LIQD Take 1 Container by mouth 3 (three) times daily between meals. 12/20/14  Yes Thurnell Lose, MD  folic acid (FOLVITE) 1 MG tablet Take 1 tablet (1 mg total) by mouth daily. 10/05/14  Yes Nita Sells, MD  furosemide (LASIX) 40 MG tablet Take 1 tablet (40 mg total) by mouth daily. 01/15/15  Yes Julianne Rice, MD  lactulose (CHRONULAC) 10 GM/15ML solution Take 45 mLs (30  g total) by mouth 3 (three) times daily. 12/03/14  Yes Annita Brod, MD  LANTUS SOLOSTAR 100 UNIT/ML Solostar Pen Inject 10 Units into the skin daily at 10 pm. 01/23/15  Yes Robbie Lis, MD  magnesium oxide (MAG-OX) 400 (241.3 MG) MG tablet Take 1 tablet (400 mg total) by mouth 2 (two) times daily. 09/03/14  Yes Reyne Dumas, MD  methocarbamol (ROBAXIN) 500 MG tablet Take 2 tablets (1,000 mg total) by mouth every 8 (eight) hours as needed. Patient taking differently: Take 1,000 mg by mouth every 8 (eight) hours as needed for muscle spasms.  01/15/15  Yes Julianne Rice, MD  Multiple Vitamin (MULTIVITAMIN WITH MINERALS) TABS tablet Take 1 tablet by mouth daily. 07/31/14  Yes Jessica U Vann,  DO  NOVOLOG FLEXPEN 100 UNIT/ML FlexPen Inject 6 Units into the skin 3 (three) times daily with meals. 11/24/14  Yes Historical Provider, MD  Oxycodone HCl 10 MG TABS Take 1 tablet (10 mg total) by mouth 3 (three) times daily. 01/15/15  Yes Julianne Rice, MD  pantoprazole (PROTONIX) 40 MG tablet Take 1 tablet (40 mg total) by mouth daily. 01/23/15  Yes Robbie Lis, MD  propranolol (INDERAL) 20 MG tablet Take 20 mg by mouth 2 (two) times daily. 09/24/14  Yes Historical Provider, MD  rifaximin (XIFAXAN) 550 MG TABS tablet Take 1 tablet (550 mg total) by mouth 2 (two) times daily. 01/23/15  Yes Robbie Lis, MD  spironolactone (ALDACTONE) 50 MG tablet Take 1 tablet (50 mg total) by mouth daily. 12/03/14  Yes Annita Brod, MD  thiamine (VITAMIN B-1) 100 MG tablet Take 100 mg by mouth daily.   Yes Historical Provider, MD   Physical Exam: Filed Vitals:   01/25/15 1815 01/25/15 1830 01/25/15 1845 01/25/15 1900  BP: 115/66 140/69 107/53 120/58  Pulse: 90 90 93 89  Temp:      TempSrc:      Resp:      Height:      SpO2: 100% 99% 99% 100%     General: A/O 4, patient extremely uncomfortable secondary to anasarca, scrotal swelling, No acute respiratory distress Eyes: Negative headache, eye pain, scleral hemorrhage ENT: Negative Runny nose, negative ear pain, negative tinnitus, negative gingival bleeding, Neck:  Negative scars, masses, torticollis, lymphadenopathy, JVD Lungs: diffuse expiratory wheezing, negative crackles Cardiovascular: Regular rate and rhythm without murmur gallop or rub normal S1 and S2 Abdomen: Positive abdominal pain secondary to severe ascites, negative dysphagia, positive tender palpation, positive distention, soft, bowel sounds positive, positive fluid wave with spontaneous loss of peritoneal fluid LLQ from previous paracentesis site.  Extremities: anasarca with +4 swelling of upper and lower extremities, negative sign of infection  Psychiatric:  Negative depression,  negative anxiety, negative fatigue, negative mania  Neurologic:  Cranial nerves II through XII intact, tongue/uvula midline, all extremities muscle strength 5/5, sensation intact throughout,  negative dysarthria, negative expressive aphasia, negative receptive aphasia.  Skin/Hemolytic/lymphatic; patient with bruising, however overall extremities which is chronic.      Labs on Admission:  Basic Metabolic Panel:  Recent Labs Lab 01/19/15 0404 01/20/15 0330 01/21/15 0605 01/22/15 0719 01/25/15 1607  NA 132* 133* 131* 129* 129*  K 4.2 4.1 3.9 3.8 3.7  CL 105 106 105 104 105  CO2 20* 20* 19* 19* 18*  GLUCOSE 147* 146* 141* 137* 170*  BUN 54* 50* 45* 46* 53*  CREATININE 1.90* 1.76* 1.64* 1.53* 2.07*  CALCIUM 7.8* 7.8* 7.6* 7.8* 8.1*   Liver Function Tests:  Recent Labs Lab 01/19/15 0404 01/20/15 0330 01/21/15 0605 01/25/15 1607  AST 73* 64* 68* 105*  ALT 37 36 37 54  ALKPHOS 117 123 128* 148*  BILITOT 2.3* 1.9* 2.3* 2.4*  PROT 5.0* 4.9* 5.2* 5.1*  ALBUMIN 2.1* 2.4* 2.4* 2.1*    Recent Labs Lab 01/25/15 1607  LIPASE 30    Recent Labs Lab 01/19/15 0404 01/25/15 1623  AMMONIA 22 89*   CBC:  Recent Labs Lab 01/19/15 0404 01/20/15 0330 01/21/15 0605 01/22/15 0719 01/25/15 1607  WBC 4.1 3.6* 5.1 4.7 5.7  NEUTROABS  --   --   --   --  4.1  HGB 7.5* 7.2* 8.5* 8.3* 8.3*  HCT 23.4* 21.8* 25.2* 24.9* 25.1*  MCV 92.5 93.6 92.6 92.2 91.9  PLT 57* 52* 59* 72* 51*   Cardiac Enzymes: No results for input(s): CKTOTAL, CKMB, CKMBINDEX, TROPONINI in the last 168 hours.  BNP (last 3 results)  Recent Labs  11/29/14 1145 01/15/15 0010 01/17/15 1815  BNP 29.4 46.4 75.3    ProBNP (last 3 results)  Recent Labs  02/08/14 1830 02/10/14 1547  PROBNP 966.3* 805.5*    CBG:  Recent Labs Lab 01/22/15 1628 01/22/15 2114 01/23/15 0752 01/23/15 1138 01/23/15 1639  GLUCAP 190* 190* 162* 160* 165*    Radiological Exams on Admission: Dg Chest Portable 1  View  01/25/2015   CLINICAL DATA:  Abdominal pain with chronic ascites.  EXAM: PORTABLE CHEST - 1 VIEW  COMPARISON:  01/20/2015  FINDINGS: The heart size and mediastinal contours are within normal limits. Both lungs are clear. The visualized skeletal structures are unremarkable.  IMPRESSION: No active disease.   Electronically Signed   By: Kathreen Devoid   On: 01/25/2015 16:09    EKG: Pending   Assessment/Plan Active Problems:   Alcohol abuse   HTN (hypertension)   Thrombocytopenia   Bipolar affect, depressed   GI bleed   Depression, major, recurrent, moderate   MDD (major depressive disorder)   Alcoholic cirrhosis of liver with ascites   Chronic diastolic heart failure   Protein-calorie malnutrition, severe   Sepsis   Anasarca  Encephalopathy  -Patient just discharged with a diagnosis of encephalopathy, appears to be at baseline currently.  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  -See encephalopathy  -ED to perform paracentesis and send fluid for cultures. -Albumin 75 gm prior to paracentesis  - Continue folic acid, multivitamin and thiamine -Continue Protonix 40 mg daily   Alcohol abuse -Ethanol level pending  Chronic diastolic CHF / Essential hypertension  - Last 2-D echo in March 2016 showed grade 1 diastolic dysfunction with preserved ejection fraction -Transfuse for hemoglobin<8  Acute Kidney Injury(baseline Cr 1.6-2.0) -Patient's  Cr basically at baseline 2.07  -Continue home dose Aldactone 50 mg daily -Patient clearly hypervolemic will initiate Lasix drip  -Strict I&O -Daily a.m. weight  Hyperkalemia -Currently within normal limit  Hyponatremia - Likely related to fluid overload, anasarca - Sodium level stable  Upper GI bleed -Currently no signs or symptoms of active bleed, monitor  closely  Acute urinary retention -Please Foley catheter  Chronic pancytopenia.  -Most likely secondary to patient's liver cirrhosis -Continue to monitor.  -Avoid pharmacological DVT prophylaxis -SCDs  Moderate protein calorie malnutrition / Failure to thrive  - In the context of chronic liver cirrhosis, chronic illness - Continue nutritional supplementation  Diabetes mellitus type 2 with renal manifestations - Maintain nothing by mouth until patient paracentesis completed -Sensitive SSI  COPD - Stable respiratory status.   Generalized weakness - PT/OT consult as well as CSW consult for SNF placement  Goals of care -Spoke at length with patient concerning his disease process and the stage. Patient states he now understands incapable of caring for himself and has agreed to SNF upon discharge. -Spoke with patient concerning the difference between DO NOT RESUSCITATE and Full Code and his expectation of surviving a cold and wished to use aggressive treatment to extend his life. Patient agrees that he would like to be DO NOT RESUSCITATE.   -Patient has accepted that he should be DO NOT RESUSCITATE, prior to discharge may want to address hospice care.  Bipolar/depression   Code Status: DO NOT RESUSCITATE Family Communication: None Disposition Plan: SNF   Care during the described time interval was provided by me .  I have reviewed this patient's available data, including medical history, events of note, physical examination, and all test results as part of my evaluation. I have personally reviewed and interpreted all radiology studies.    Time spent: 60 minutes  Allie Bossier [facility] Pager [phone]  If 7PM-7AM, please contact night-coverage [email or web address] Password [facility] 01/25/2015, 8:33 PM

## 2015-01-25 NOTE — ED Notes (Signed)
Admitting MD at bedside.

## 2015-01-25 NOTE — ED Notes (Signed)
Pt arrives via EMS from home. Pt fell last week and was admitted to [facility] - pt was discharged yesterday and has had increasing abd and LE distension. Pt states that he has a hx of the same and requires draining. Has not has had his abd drained since before the fall. abd is weeping.

## 2015-01-25 NOTE — ED Provider Notes (Signed)
CSN: [id number]     Arrival date & time 01/25/15  1513 History   None    Chief Complaint  Patient presents with  . Abdominal Pain   Patient is a 59 y.o. male presenting with general illness. The history is provided by the patient. No language interpreter was used.  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 Of note patient was recently admitted to our facility from 01/17/15-01/23/15 for inpatient regimen of IV antibiotics for cellulitis. Repeat presenting today with abdominal distention and pain as well as scrotal swelling and pain. Associated symptoms: abdominal pain, diarrhea (baseline with lactulose), fever, nausea and rash   Associated symptoms: no chest pain and no shortness of breath     Past Medical History  Diagnosis Date  . Neuropathy   . Diabetes mellitus   . Bipolar affect, depressed   . Hypertension   . Arthritis   . Stroke     Mini stroke about 48yrs ago  . Cirrhosis   . Alcohol abuse   . Chronic pain   . Cocaine abuse   . Muscle spasm     both legs  . Encephalopathy, hepatic   . Detached retina   . COPD (chronic obstructive pulmonary disease)     emphysema  . Bronchitis   . Barrett's esophagus   . GERD (gastroesophageal reflux disease)     has ulcer  . Anemia    Past Surgical History  Procedure Laterality Date  . Fracture surgery      Leg and arm 64yrs ago  . Esophagogastroduodenoscopy  04/04/2012    Procedure: ESOPHAGOGASTRODUODENOSCOPY (EGD);  Surgeon: Irene Shipper, MD;  Location: [facility] ENDOSCOPY;  Service: Endoscopy;  Laterality: N/A;  . Esophagogastroduodenoscopy Left 03/13/2013    Procedure: ESOPHAGOGASTRODUODENOSCOPY (EGD);   Surgeon: Arta Silence, MD;  Location: [facility] ENDOSCOPY;  Service: Endoscopy;  Laterality: Left;  Marland Kitchen Eye surgery  8 months ago both eyes    cataracts both eyes, detached eye, gas pocket  . Vasectomy    . Pars plana vitrectomy Left 07/08/2013    Procedure: PARS PLANA VITRECTOMY WITH 25 GAUGE;  Surgeon: Hurman Horn, MD;  Location: [place];  Service: Ophthalmology;  Laterality: Left;  . Intraocular lens removal Left 07/08/2013    Procedure: REMOVAL OF INTRAOCULAR LENS;  Surgeon: Hurman Horn, MD;  Location: [place];  Service: Ophthalmology;  Laterality: Left;  . Placement and suture of secondary intraocular lens Left 07/08/2013    Procedure: PLACEMENT AND SUTURE OF SECONDARY INTRAOCULAR LENS;  Surgeon: Hurman Horn, MD;  Location: [place];  Service: Ophthalmology;  Laterality: Left;  Insertion of Anterior Capsule Intraocular Lens   . Esophagogastroduodenoscopy N/A 01/16/2014    Procedure: ESOPHAGOGASTRODUODENOSCOPY (EGD);  Surgeon: Winfield Cunas., MD;  Location: [facility] ENDOSCOPY;  Service: Endoscopy;  Laterality: N/A;  . Colonoscopy N/A 01/17/2014    Procedure: COLONOSCOPY;  Surgeon: Winfield Cunas., MD;  Location: [facility] ENDOSCOPY;  Service: Endoscopy;  Laterality: N/A;  possible banding  . Esophagogastroduodenoscopy N/A 08/30/2014    Procedure: ESOPHAGOGASTRODUODENOSCOPY (EGD);  Surgeon: Wilford Corner, MD;  Location: [facility] ENDOSCOPY;  Service: Endoscopy;  Laterality: N/A;  bedside  . Esophagogastroduodenoscopy N/A 11/30/2014    Procedure: ESOPHAGOGASTRODUODENOSCOPY (EGD);  Surgeon: Evette Doffing  Michail Sermon, MD;  Location: [place];  Service: Endoscopy;  Laterality: N/A;  . Esophagogastroduodenoscopy N/A 12/15/2014    Procedure: ESOPHAGOGASTRODUODENOSCOPY (EGD);  Surgeon: Teena Irani, MD;  Location: [facility] ENDOSCOPY;  Service: Endoscopy;  Laterality: N/A;   Family History  Problem Relation Age of Onset  . Hypotension Mother    Social History  Substance Use Topics  . Smoking status: Current Every Day Smoker -- 1.00  packs/day for 30 years    Types: Cigarettes  . Smokeless tobacco: Never Used  . Alcohol Use: 0.0 oz/week     Comment: 12 pk beer daily  06/2013 - no alcohol since 11/2012    Review of Systems  Constitutional: Positive for fever.  Respiratory: Negative for shortness of breath.   Cardiovascular: Negative for chest pain.  Gastrointestinal: Positive for nausea, abdominal pain and diarrhea (baseline with lactulose).  Genitourinary: Positive for scrotal swelling and testicular pain.  Skin: Positive for color change, rash and wound.  All other systems reviewed and are negative.   Allergies  Review of patient's allergies indicates no known allergies.  Home Medications   Prior to Admission medications   Medication Sig Start Date End Date Taking? Authorizing Provider  albuterol (PROVENTIL) (2.5 MG/3ML) 0.083% nebulizer solution Take 3 mLs (2.5 mg total) by nebulization every 4 (four) hours as needed for wheezing or shortness of breath. 01/23/15  Yes Robbie Lis, MD  Amino Acids-Protein Hydrolys (FEEDING SUPPLEMENT, PRO-STAT SUGAR FREE 64,) LIQD Take 30 mLs by mouth 2 (two) times daily. 01/23/15  Yes Robbie Lis, MD  amitriptyline (ELAVIL) 25 MG tablet Take 1 tablet (25 mg total) by mouth at bedtime. 10/05/14  Yes Nita Sells, MD  CALCIUM-VITAMIN D PO Take 1 tablet by mouth daily.    Yes Historical Provider, MD  clonazePAM (KLONOPIN) 0.25 MG disintegrating tablet Take 1 tablet (0.25 mg total) by mouth 2 (two) times daily. 12/20/14  Yes Thurnell Lose, MD  collagenase (SANTYL) ointment Apply topically daily. 01/23/15  Yes Robbie Lis, MD  diclofenac sodium (VOLTAREN) 1 % GEL Apply 4 g topically as needed (for back of leg pain).   Yes Historical Provider, MD  feeding supplement (BOOST / RESOURCE BREEZE) LIQD Take 1 Container by mouth 3 (three) times daily between meals. 12/20/14  Yes Thurnell Lose, MD  folic acid (FOLVITE) 1 MG tablet Take 1 tablet (1 mg total) by mouth daily. 10/05/14  Yes  Nita Sells, MD  lactulose (CHRONULAC) 10 GM/15ML solution Take 45 mLs (30 g total) by mouth 3 (three) times daily. 12/03/14  Yes Annita Brod, MD  magnesium oxide (MAG-OX) 400 (241.3 MG) MG tablet Take 1 tablet (400 mg total) by mouth 2 (two) times daily. 09/03/14  Yes Reyne Dumas, MD  methocarbamol (ROBAXIN) 500 MG tablet Take 2 tablets (1,000 mg total) by mouth every 8 (eight) hours as needed. Patient taking differently: Take 1,000 mg by mouth every 8 (eight) hours as needed for muscle spasms.  01/15/15  Yes Julianne Rice, MD  Multiple Vitamin (MULTIVITAMIN WITH MINERALS) TABS tablet Take 1 tablet by mouth daily. 07/31/14  Yes Jessica U Vann, DO  NOVOLOG FLEXPEN 100 UNIT/ML FlexPen Inject 6 Units into the skin 3 (three) times daily with meals. 11/24/14  Yes Historical Provider, MD  Oxycodone HCl 10 MG TABS Take 1 tablet (10 mg total) by mouth 3 (three) times daily. 01/15/15  Yes Julianne Rice, MD  pantoprazole (PROTONIX) 40 MG tablet Take 1 tablet (40 mg total) by mouth daily. 01/23/15  Yes Robbie Lis, MD  propranolol (INDERAL) 20 MG tablet Take 20 mg by mouth 2 (two) times daily. 09/24/14  Yes Historical Provider, MD  rifaximin (XIFAXAN) 550 MG TABS tablet Take 1 tablet (550 mg total) by mouth 2 (two) times daily. 01/23/15  Yes Robbie Lis, MD  spironolactone (ALDACTONE) 50 MG tablet Take 1 tablet (50 mg total) by mouth daily. 12/03/14  Yes Annita Brod, MD  thiamine (VITAMIN B-1) 100 MG tablet Take 100 mg by mouth daily.   Yes Historical Provider, MD   BP 136/64 mmHg  Pulse 92  Temp(Src) 97.1 F (36.2 C) (Oral)  Resp 12  Ht 5\' 11"  (1.803 m)  Wt 251 lb 15.8 oz (114.3 kg)  BMI 35.16 kg/m2  SpO2 100%   Physical Exam  Constitutional: He is oriented to person, place, and time. He appears distressed.  Exam above notable for elderly chronically ill-appearing male lying in stretcher tearful and in mild to moderate distress secondary to pain  HENT:  Head: Normocephalic and  atraumatic.  Scleral icterus and sublingual icterus noted  Eyes: Conjunctivae are normal. Pupils are equal, round, and reactive to light. Scleral icterus is present.  Neck: Normal range of motion. Neck supple.  Cardiovascular: Regular rhythm and intact distal pulses.   Heart rate 90s  Pulmonary/Chest: Effort normal and breath sounds normal.  Abdominal: He exhibits distension. There is tenderness. There is no rebound and no guarding.  Abdomen distended. Fluid wave and tympanitic. 2 previous paracenteses sites noted 1 to left and 1 to right abdomen - active drainage of clear fluid from left puncture site. Abdomen diffusely tender. Spider angiomata noted over her abdomen and chest. Abdominal striae also noted..  Musculoskeletal: He exhibits edema and tenderness.  Neurological: He is alert and oriented to person, place, and time.  Skin: He is not diaphoretic.   2+ pitting edema to lower extremities. Large amount of edema and overlying erythema of the scrotum - no obvious open wound    ED Course  PARACENTESIS Date/Time: 01/25/2015 7:00 PM Performed by: Mayer Camel Authorized by: Mayer Camel Consent: Verbal consent obtained. Written consent obtained. Risks and benefits: risks, benefits and alternatives were discussed Consent given by: patient Patient understanding: patient states understanding of the procedure being performed Patient consent: the patient's understanding of the procedure matches consent given Procedure consent: procedure consent matches procedure scheduled Relevant documents: relevant documents present and verified Test results: test results available and properly labeled Site marked: the operative site was marked Imaging studies: imaging studies available Required items: required blood products, implants, devices, and special equipment available Patient identity confirmed: verbally with patient, arm band and hospital-assigned identification number Time out:  Immediately prior to procedure a "time out" was called to verify the correct patient, procedure, equipment, support staff and site/side marked as required. Initial or subsequent exam: initial Procedure purpose: diagnostic Indications: abdominal discomfort secondary to ascites Anesthesia: local infiltration Local anesthetic: lidocaine 1% without epinephrine Anesthetic total: 3 ml Patient sedated: no Preparation: Patient was prepped and draped in the usual sterile fashion. Needle gauge: 18 Ultrasound guidance: yes Puncture site: right lower quadrant Fluid removed: 15(ml) Fluid appearance: chylous Dressing: 4x4 sterile gauze and pressure dressing Patient tolerance: Patient tolerated the procedure well with no immediate complications    Labs Review Labs Reviewed  CBC WITH DIFFERENTIAL/PLATELET - Abnormal; Notable for the following:    RBC 2.73 (*)    Hemoglobin 8.3 (*)    HCT 25.1 (*)    RDW 21.4 (*)  Platelets 51 (*)    Lymphocytes Relative 10 (*)    Eosinophils Relative 6 (*)    Lymphs Abs 0.6 (*)    All other components within normal limits  COMPREHENSIVE METABOLIC PANEL - Abnormal; Notable for the following:    Sodium 129 (*)    CO2 18 (*)    Glucose, Bld 170 (*)    BUN 53 (*)    Creatinine, Ser 2.07 (*)    Calcium 8.1 (*)    Total Protein 5.1 (*)    Albumin 2.1 (*)    AST 105 (*)    Alkaline Phosphatase 148 (*)    Total Bilirubin 2.4 (*)    GFR calc non Af Amer 33 (*)    GFR calc Af Amer 39 (*)    All other components within normal limits  AMMONIA - Abnormal; Notable for the following:    Ammonia 89 (*)    All other components within normal limits  URINE RAPID DRUG SCREEN, HOSP PERFORMED - Abnormal; Notable for the following:    Opiates POSITIVE (*)    Benzodiazepines POSITIVE (*)    All other components within normal limits  I-STAT CG4 LACTIC ACID, ED - Abnormal; Notable for the following:    Lactic Acid, Venous 2.35 (*)    All other components within normal  limits  CBG MONITORING, ED - Abnormal; Notable for the following:    Glucose-Capillary 135 (*)    All other components within normal limits  CULTURE, BLOOD (ROUTINE X 2)  CULTURE, BLOOD (ROUTINE X 2)  BODY FLUID CULTURE  URINE CULTURE  MRSA PCR SCREENING  LIPASE, BLOOD  URINALYSIS, ROUTINE W REFLEX MICROSCOPIC (NOT AT [facility])  ETHANOL  LACTATE DEHYDROGENASE, BODY FLUID  GLUCOSE, PERITONEAL FLUID  PROTEIN, BODY FLUID  ALBUMIN, FLUID  COMPREHENSIVE METABOLIC PANEL  MAGNESIUM  CBC WITH DIFFERENTIAL/PLATELET  PROTIME-INR  PROTIME-INR  I-STAT CG4 LACTIC ACID, ED    Imaging Review Dg Chest Portable 1 View  01/25/2015   CLINICAL DATA:  Abdominal pain with chronic ascites.  EXAM: PORTABLE CHEST - 1 VIEW  COMPARISON:  01/20/2015  FINDINGS: The heart size and mediastinal contours are within normal limits. Both lungs are clear. The visualized skeletal structures are unremarkable.  IMPRESSION: No active disease.   Electronically Signed   By: Kathreen Devoid   On: 01/25/2015 16:09   I have personally reviewed and evaluated these images and lab results as part of my medical decision-making.   EKG Interpretation None      MDM  Mr. Wimberly is a 59 yo male with PMHx of HTN, DM, CKD (baseline Cr 1.6-2.0), COPD, and advanced acoholic cirrhosis (massive anasarca and chronic ascites with frequent therapeutic paracenteses) presenting with abdominal pain. Of note patient was recently admitted to our facility from 01/17/15-01/23/15 for inpatient regimen of IV antibiotics for cellulitis. Repeat presenting today with abdominal distention and pain as well as scrotal swelling and pain.  Exam above notable for elderly chronically ill-appearing male lying in stretcher tearful and in mild to moderate distress secondary to pain. Afebrile. Heart rate 90s. Hypotensive. Abdomen distended. Fluid wave and tympanitic. 2 previous paracenteses sites noted 1 to left and 1 to right abdomen - active drainage of clear fluid from  left puncture site. Abdomen diffusely tender. Spider angiomata noted over her abdomen and chest. Abdominal striae also noted. Scleral icterus and sublingual icterus noted. 2+ pitting edema to lower extremities. Large amount of edema and overlying erythema of the scrotum - no obvious open wound.  Alert and oriented 3.  Given hypotension and borderline tachycardia with mildly decreased temperature as well as diffuse abdominal pain and moderate to severe edema and erythema of the scrotum and lower extremities, concern for sepsis. Blood and urine cultures collected. Lactic acid 2.35. Broad-spectrum antibiotics initiated with vancomycin and Zosyn. Paracentesis performed in the emergency department as above with fluid sent for analysis. UDS positive for opiates and benzos. CMP with CO2 of 18 - consistent with mild acidosis. Creatinine 2.07 - slightly increased from baseline of 1.6-1.8. BUN 53. Ammonia 89 increased from previous level of 20. Chest x-ray showing no acute pulmonary disease  Patient admitted to hospitalist for further evaluation and management of abdominal distention and pain with concern for sepsis. Patient understands and agrees with the plan and has no further questions concerning this time  Patient care discussed with and followed by my attending, Dr. Malachy Moan   Final diagnoses:  Sepsis, due to unspecified organism  Alcoholic cirrhosis of liver with ascites  Anasarca    Mayer Camel, MD 01/26/15 0006  Orpah Greek, MD 01/27/15 [id number]

## 2015-01-25 NOTE — Progress Notes (Signed)
Ceftriaxone for SBP prophylaxis per pharmacy ordered.  Ceftriaxone does not need renal adjustment.  [employer] policy allows pharmacy to change the ordered dose based on indication without contacting the physician, therefore a consult is not needed.   Plan: -ceftriaxone 1g IV q24h -pharmacy to sign off as no adjustment needed.   Raistlin Gum D. Terita Hejl, PharmD, BCPS Clinical Pharmacist 01/25/2015 9:35 PM

## 2015-01-25 NOTE — ED Notes (Signed)
MD at bedside. 

## 2015-01-25 NOTE — ED Notes (Signed)
Dr.Franasiak at bedside for paracentesis to R lower abdomen.

## 2015-01-25 NOTE — ED Provider Notes (Signed)
Patient presented to the ER with diffuse swelling. Patient with previous history of liver disease, recent hospitalization, discharged 2 days ago. Patient was treated for cellulitis and ascites during hospitalization. Patient expressing increasing abdominal distention, abdominal pain, scrotal swelling.  Face to face Exam: HEENT - PERRLA Lungs - CTAB Heart - RRR, no M/R/G Abd - significant distention with diffuse tenderness Neuro - alert, oriented x3 Muscoskeletal - 3+ bilateral pitting edema with erythema  Plan: Patient with history of liver cirrhosis presents with diffuse anasarca, possible recurrent lower extremity cellulitis, possible SBP and sepsis. Initiate broad-spectrum antibiotic coverage and sepsis workup, but hold on aggressive fluid resuscitation due to third spacing.  Orpah Greek, MD 01/25/15 [phone]

## 2015-01-26 ENCOUNTER — Inpatient Hospital Stay (HOSPITAL_COMMUNITY): Payer: Medicare PPO

## 2015-01-26 DIAGNOSIS — A419 Sepsis, unspecified organism: Principal | ICD-10-CM

## 2015-01-26 DIAGNOSIS — I10 Essential (primary) hypertension: Secondary | ICD-10-CM

## 2015-01-26 LAB — COMPREHENSIVE METABOLIC PANEL
ALBUMIN: 1.8 g/dL — AB (ref 3.5–5.0)
ALT: 46 U/L (ref 17–63)
AST: 88 U/L — AB (ref 15–41)
Alkaline Phosphatase: 132 U/L — ABNORMAL HIGH (ref 38–126)
Anion gap: 9 (ref 5–15)
BUN: 57 mg/dL — AB (ref 6–20)
CHLORIDE: 102 mmol/L (ref 101–111)
CO2: 18 mmol/L — AB (ref 22–32)
CREATININE: 2.18 mg/dL — AB (ref 0.61–1.24)
Calcium: 7.8 mg/dL — ABNORMAL LOW (ref 8.9–10.3)
GFR calc non Af Amer: 31 mL/min — ABNORMAL LOW (ref >60)
GFR, EST AFRICAN AMERICAN: 36 mL/min — AB (ref >60)
GLUCOSE: 108 mg/dL — AB (ref 65–99)
Potassium: 3.6 mmol/L (ref 3.5–5.1)
SODIUM: 129 mmol/L — AB (ref 135–145)
Total Bilirubin: 2.3 mg/dL — ABNORMAL HIGH (ref 0.3–1.2)
Total Protein: 4.6 g/dL — ABNORMAL LOW (ref 6.5–8.1)

## 2015-01-26 LAB — CBC WITH DIFFERENTIAL/PLATELET
BASOS PCT: 1 % (ref 0–1)
Basophils Absolute: 0.1 10*3/uL (ref 0.0–0.1)
EOS PCT: 5 % (ref 0–5)
Eosinophils Absolute: 0.3 10*3/uL (ref 0.0–0.7)
HEMATOCRIT: 25.3 % — AB (ref 39.0–52.0)
Hemoglobin: 8.6 g/dL — ABNORMAL LOW (ref 13.0–17.0)
LYMPHS ABS: 0.6 10*3/uL — AB (ref 0.7–4.0)
Lymphocytes Relative: 11 % — ABNORMAL LOW (ref 12–46)
MCH: 31.3 pg (ref 26.0–34.0)
MCHC: 34 g/dL (ref 30.0–36.0)
MCV: 92 fL (ref 78.0–100.0)
MONOS PCT: 10 % (ref 3–12)
Monocytes Absolute: 0.6 10*3/uL (ref 0.1–1.0)
Neutro Abs: 4.1 10*3/uL (ref 1.7–7.7)
Neutrophils Relative %: 73 % (ref 43–77)
Platelets: 49 10*3/uL — ABNORMAL LOW (ref 150–400)
RBC: 2.75 MIL/uL — AB (ref 4.22–5.81)
RDW: 21.3 % — AB (ref 11.5–15.5)
WBC: 5.7 10*3/uL (ref 4.0–10.5)

## 2015-01-26 LAB — GLUCOSE, CAPILLARY
GLUCOSE-CAPILLARY: 104 mg/dL — AB (ref 65–99)
GLUCOSE-CAPILLARY: 113 mg/dL — AB (ref 65–99)
GLUCOSE-CAPILLARY: 129 mg/dL — AB (ref 65–99)
GLUCOSE-CAPILLARY: 82 mg/dL (ref 65–99)
Glucose-Capillary: 122 mg/dL — ABNORMAL HIGH (ref 65–99)
Glucose-Capillary: 113 mg/dL — ABNORMAL HIGH (ref 65–99)

## 2015-01-26 LAB — PROTIME-INR
INR: 1.65 — ABNORMAL HIGH (ref 0.00–1.49)
INR: 1.67 — AB (ref 0.00–1.49)
PROTHROMBIN TIME: 19.5 s — AB (ref 11.6–15.2)
Prothrombin Time: 19.7 seconds — ABNORMAL HIGH (ref 11.6–15.2)

## 2015-01-26 LAB — MRSA PCR SCREENING: MRSA by PCR: POSITIVE — AB

## 2015-01-26 LAB — MAGNESIUM: Magnesium: 1.7 mg/dL (ref 1.7–2.4)

## 2015-01-26 MED ORDER — SODIUM CHLORIDE 0.9 % IV SOLN: INTRAVENOUS | Status: DC | PRN

## 2015-01-26 MED ORDER — INSULIN ASPART 100 UNIT/ML ~~LOC~~ SOLN
0.0000 [IU] | Freq: Three times a day (TID) | SUBCUTANEOUS | Status: DC
  Administered 2015-01-27: 1 [IU] via SUBCUTANEOUS
  Administered 2015-01-27 – 2015-01-28 (×4): 2 [IU] via SUBCUTANEOUS
  Administered 2015-01-29 (×3): 1 [IU] via SUBCUTANEOUS
  Administered 2015-01-30 – 2015-01-31 (×3): 2 [IU] via SUBCUTANEOUS
  Administered 2015-01-31: 3 [IU] via SUBCUTANEOUS

## 2015-01-26 MED ORDER — SODIUM CHLORIDE 0.9 % IV BOLUS (SEPSIS)
250.0000 mL | Freq: Once | INTRAVENOUS | Status: AC
  Administered 2015-01-26: 250 mL via INTRAVENOUS

## 2015-01-26 MED ORDER — INFLUENZA VAC SPLIT QUAD 0.5 ML IM SUSY
0.5000 mL | PREFILLED_SYRINGE | INTRAMUSCULAR | Status: AC
  Administered 2015-01-27: 0.5 mL via INTRAMUSCULAR
  Filled 2015-01-26: qty 0.5

## 2015-01-26 MED ORDER — CHLORHEXIDINE GLUCONATE CLOTH 2 % EX PADS
6.0000 | MEDICATED_PAD | Freq: Every day | CUTANEOUS | Status: DC
  Administered 2015-01-26 – 2015-01-29 (×4): 6 via TOPICAL

## 2015-01-26 MED ORDER — SODIUM CHLORIDE 0.9 % IV BOLUS (SEPSIS)
250.0000 mL | Freq: Once | INTRAVENOUS | Status: AC
Start: 2015-01-26 — End: 2015-01-26
  Administered 2015-01-26: 250 mL via INTRAVENOUS

## 2015-01-26 MED ORDER — SODIUM CHLORIDE 0.9 % IV BOLUS (SEPSIS): 500.0000 mL | Freq: Once | INTRAVENOUS | Status: DC

## 2015-01-26 MED ORDER — MUPIROCIN 2 % EX OINT
1.0000 "application " | TOPICAL_OINTMENT | Freq: Two times a day (BID) | CUTANEOUS | Status: AC
  Administered 2015-01-26 – 2015-01-30 (×10): 1 via NASAL
  Filled 2015-01-26 (×2): qty 22

## 2015-01-26 NOTE — Care Management Note (Signed)
Case Management Note  Patient Details  Name: KIENAN DOUBLIN MRN: [id number] Date of Birth: [birth year]  Subjective/Objective:         Adm w sepsis           Action/Plan:lives at home, pcp dr Jonelle Sidle, may of had [facility] following after last hosp   Expected Discharge Date:                  Expected Discharge Plan:  [place]  In-House Referral:  Clinical Social Work  Discharge planning Services  CM Consult  Post Acute Care Choice:    Choice offered to:     DME Arranged:    DME Agency:     HH Arranged:    [place] Agency:     Status of Service:     Medicare Important Message Given:    Date Medicare IM Given:    Medicare IM give by:    Date Additional Medicare IM Given:    Additional Medicare Important Message give by:     If discussed at [place] of Stay Meetings, dates discussed:    Additional Comments: ur review, sw ref made, went home w [facility] but not able to manage home also  Lacretia Leigh, RN 01/26/2015, 11:39 AM

## 2015-01-26 NOTE — Progress Notes (Signed)
Spoke with Dr. Sherral Hammers in regards to paracentesis. Order received to put skin glue over oozy site. A11 sent to [facility].

## 2015-01-26 NOTE — Progress Notes (Signed)
[facility] PROGRESS NOTE  [name] HWK:[id number] DOB: 03/09/1956 DOA: 01/25/2015 PCP: Barbette Merino, MD  Assessment/Plan: Active Problems:   SIRS - No source of infection identified patient is p.m. presumably treated with antibiotics. - We'll assess peritoneal fluid for signs of infection - Patient is currently on Rocephin - urine and blood cx pending.    Anasarca - Once able will try to get paracentesis if blood pressure permits - treat supportively.    ALC (alcoholic liver cirrhosis) - Leading to problem # 2 - stable - continue rifaximin and lactulose  Alcohol abuse - no alcohol associated anxiety noticed on exam today.    Hypotension - Will fluid bolus and patient to get albumin prior to paracentesis. Should blood pressures remain low will have to defer paracentesis until patient is normotensive. - holding spironolactone and blood pressure medication.    Protein-calorie malnutrition, severe   - currently on nutritional supplementation.   Code Status: DNR Family Communication: no family at bedside. Disposition Plan: Pending improvement in condition.   Consultants:  none  Procedures:  Paracentesis pending improvement in blood pressures  Antibiotics:  Rocephin and Xifaxan  HPI/Subjective: Pt has no new complaints. No acute issues overnight. Nurse has paged about soft blood pressures. Patient reports he feels better.  Objective: Filed Vitals:   01/26/15 1100  BP: 93/47  Pulse: 65  Temp:   Resp: 11    Intake/Output Summary (Last 24 hours) at 01/26/15 1155 Last data filed at 01/26/15 1007  Gross per 24 hour  Intake 1150.17 ml  Output    275 ml  Net 875.17 ml   Filed Weights   01/25/15 2317 01/26/15 0600  Weight: 114.3 kg (251 lb 15.8 oz) 115.9 kg (255 lb 8.2 oz)    Exam:   General:  Pt in nad, alert and awake  Cardiovascular: rrr, no mrg  Respiratory: cta bl, no wheezes3  Abdomen: distended, tympanic, no rebound  tenderness  Musculoskeletal: no cyanosis or clubbing   Data Reviewed: Basic Metabolic Panel:  Recent Labs Lab 01/20/15 0330 01/21/15 0605 01/22/15 0719 01/25/15 1607 01/26/15 0820  NA 133* 131* 129* 129* 129*  K 4.1 3.9 3.8 3.7 3.6  CL 106 105 104 105 102  CO2 20* 19* 19* 18* 18*  GLUCOSE 146* 141* 137* 170* 108*  BUN 50* 45* 46* 53* 57*  CREATININE 1.76* 1.64* 1.53* 2.07* 2.18*  CALCIUM 7.8* 7.6* 7.8* 8.1* 7.8*  MG  --   --   --   --  1.7   Liver Function Tests:  Recent Labs Lab 01/20/15 0330 01/21/15 0605 01/25/15 1607 01/26/15 0820  AST 64* 68* 105* 88*  ALT 36 37 54 46  ALKPHOS 123 128* 148* 132*  BILITOT 1.9* 2.3* 2.4* 2.3*  PROT 4.9* 5.2* 5.1* 4.6*  ALBUMIN 2.4* 2.4* 2.1* 1.8*    Recent Labs Lab 01/25/15 1607  LIPASE 30    Recent Labs Lab 01/25/15 1623  AMMONIA 89*   CBC:  Recent Labs Lab 01/20/15 0330 01/21/15 0605 01/22/15 0719 01/25/15 1607 01/26/15 0008  WBC 3.6* 5.1 4.7 5.7 5.7  NEUTROABS  --   --   --  4.1 4.1  HGB 7.2* 8.5* 8.3* 8.3* 8.6*  HCT 21.8* 25.2* 24.9* 25.1* 25.3*  MCV 93.6 92.6 92.2 91.9 92.0  PLT 52* 59* 72* 51* 49*   Cardiac Enzymes: No results for input(s): CKTOTAL, CKMB, CKMBINDEX, TROPONINI in the last 168 hours. BNP (last 3 results)  Recent Labs  11/29/14 1145 01/15/15 0010 01/17/15  1815  BNP 29.4 46.4 75.3    ProBNP (last 3 results)  Recent Labs  02/08/14 1830 02/10/14 1547  PROBNP 966.3* 805.5*    CBG:  Recent Labs Lab 01/23/15 1639 01/25/15 2133 01/25/15 2346 01/26/15 0403 01/26/15 0757  GLUCAP 165* 135* 129* 122* 113*    Recent Results (from the past 240 hour(s))  Culture, Urine     Status: None   Collection Time: 01/17/15 12:30 PM  Result Value Ref Range Status   Specimen Description URINE, CATHETERIZED  Final   Special Requests NONE  Final   Culture   Final    NO GROWTH 1 DAY Performed at [facility]    Report Status 01/19/2015 FINAL  Final  MRSA PCR Screening      Status: Abnormal   Collection Time: 01/17/15  4:50 PM  Result Value Ref Range Status   MRSA by PCR POSITIVE (A) NEGATIVE Final    Comment:        The GeneXpert MRSA Assay (FDA approved for NASAL specimens only), is one component of a comprehensive MRSA colonization surveillance program. It is not intended to diagnose MRSA infection nor to guide or monitor treatment for MRSA infections. RESULT CALLED TO, READ BACK BY AND VERIFIED WITH: G.GARLAND,RN AT 2040 ON 01/17/15 BY W.SHEA   Culture, blood (routine x 2)     Status: None   Collection Time: 01/17/15  6:15 PM  Result Value Ref Range Status   Specimen Description BLOOD RIGHT ARM  Final   Special Requests BOTTLES DRAWN AEROBIC ONLY [place]  Final   Culture   Final    NO GROWTH 5 DAYS Performed at [facility]    Report Status 01/23/2015 FINAL  Final  Culture, blood (routine x 2)     Status: None   Collection Time: 01/17/15  6:20 PM  Result Value Ref Range Status   Specimen Description BLOOD LEFT HAND  Final   Special Requests BOTTLES DRAWN AEROBIC ONLY 10CC  Final   Culture   Final    NO GROWTH 5 DAYS Performed at [facility]    Report Status 01/23/2015 FINAL  Final  Body fluid culture     Status: None   Collection Time: 01/19/15 11:06 AM  Result Value Ref Range Status   Specimen Description PERITONEAL  Final   Special Requests NONE  Final   Gram Stain   Final    FEW WBC PRESENT,BOTH PMN AND MONONUCLEAR NO ORGANISMS SEEN    Culture   Final    NO GROWTH 3 DAYS Performed at [facility]    Report Status 01/22/2015 FINAL  Final  Body fluid culture     Status: None (Preliminary result)   Collection Time: 01/25/15  9:27 PM  Result Value Ref Range Status   Specimen Description FLUID PERITONEAL  Final   Special Requests Normal  Final   Gram Stain   Final    FEW WBC PRESENT,BOTH PMN AND MONONUCLEAR NO ORGANISMS SEEN    Culture NO GROWTH < 24 HOURS  Final   Report Status PENDING  Incomplete  MRSA  PCR Screening     Status: Abnormal   Collection Time: 01/25/15 11:00 PM  Result Value Ref Range Status   MRSA by PCR POSITIVE (A) NEGATIVE Final    Comment:        The GeneXpert MRSA Assay (FDA approved for NASAL specimens only), is one component of a comprehensive MRSA colonization surveillance program. It is not intended to  diagnose MRSA infection nor to guide or monitor treatment for MRSA infections. RESULT CALLED TO, READ BACK BY AND VERIFIED WITH: RN ANN DILLARD [id number] @0045  THANEY      Studies: Dg Chest Portable 1 View  01/25/2015   CLINICAL DATA:  Abdominal pain with chronic ascites.  EXAM: PORTABLE CHEST - 1 VIEW  COMPARISON:  01/20/2015  FINDINGS: The heart size and mediastinal contours are within normal limits. Both lungs are clear. The visualized skeletal structures are unremarkable.  IMPRESSION: No active disease.   Electronically Signed   By: Kathreen Devoid   On: 01/25/2015 16:09    Scheduled Meds: . amitriptyline  25 mg Oral QHS  . aspirin EC  81 mg Oral Daily  . cefTRIAXone (ROCEPHIN)  IV  1 g Intravenous Q24H  . Chlorhexidine Gluconate Cloth  6 each Topical Q0600  . clonazePAM  0.25 mg Oral BID  . collagenase   Topical Daily  . feeding supplement  1 Container Oral TID BM  . feeding supplement (PRO-STAT SUGAR FREE 64)  30 mL Oral BID  . folic acid  1 mg Oral Daily  . [START ON 01/27/2015] Influenza vac split quadrivalent PF  0.5 mL Intramuscular Tomorrow-1000  . insulin aspart  0-9 Units Subcutaneous 6 times per day  . lactulose  30 g Oral TID  . magnesium oxide  400 mg Oral BID  . mupirocin ointment  1 application Nasal BID  . oxyCODONE  10 mg Oral TID  . pantoprazole  40 mg Oral Daily  . rifaximin  550 mg Oral BID  . thiamine  100 mg Oral Daily   Continuous Infusions:   Time spent: > 35 minutes  Velvet Bathe  [facility] Pager [phone] If 7PM-7AM, please contact night-coverage at [email or web address], password [facility] 01/26/2015, 11:55 AM  LOS: 1 day

## 2015-01-26 NOTE — Progress Notes (Signed)
OT Cancellation Note  Patient Details Name: [name] MRN: [id number] DOB: [birth year]   Cancelled Treatment:    Reason Eval/Treat Not Completed: Medical issues which prohibited therapy (current BP 76/40 (47) and awaiting paracentesis after BP stabilized, will hold and attempt next date)   Almon Register [phone] 01/26/2015, 8:52 AM

## 2015-01-26 NOTE — Progress Notes (Signed)
PT Cancellation Note  Patient Details Name: [name] MRN: [id number] DOB: Apr 29, 1956   Cancelled Treatment:    Reason Eval/Treat Not Completed: Medical issues which prohibited therapy (current BP 76/40 (47) and awaiting paracentesis after BP stabilized, will hold and attempt next date)   Melford Aase 01/26/2015, 8:11 AM Elwyn Reach, [place]

## 2015-01-26 NOTE — Progress Notes (Addendum)
Pt refused his lactulose tonight. States that he does not want to be having bowel movements tonight and states that he will take his scheduled dose of lactulose in the morning. Pt is currently A&O x4. Provided education to pt about his ammonia level and the purpose for lactulose; pt continues to refuse.

## 2015-01-27 ENCOUNTER — Inpatient Hospital Stay (HOSPITAL_COMMUNITY): Payer: Medicare PPO

## 2015-01-27 DIAGNOSIS — A419 Sepsis, unspecified organism: Secondary | ICD-10-CM | POA: Diagnosis not present

## 2015-01-27 LAB — GLUCOSE, CAPILLARY
GLUCOSE-CAPILLARY: 120 mg/dL — AB (ref 65–99)
GLUCOSE-CAPILLARY: 137 mg/dL — AB (ref 65–99)
GLUCOSE-CAPILLARY: 143 mg/dL — AB (ref 65–99)

## 2015-01-27 LAB — COMPREHENSIVE METABOLIC PANEL
ALT: 41 U/L (ref 17–63)
AST: 76 U/L — AB (ref 15–41)
Albumin: 2.5 g/dL — ABNORMAL LOW (ref 3.5–5.0)
Alkaline Phosphatase: 121 U/L (ref 38–126)
Anion gap: 7 (ref 5–15)
BILIRUBIN TOTAL: 2.4 mg/dL — AB (ref 0.3–1.2)
BUN: 59 mg/dL — AB (ref 6–20)
CO2: 20 mmol/L — ABNORMAL LOW (ref 22–32)
CREATININE: 2.49 mg/dL — AB (ref 0.61–1.24)
Calcium: 8.1 mg/dL — ABNORMAL LOW (ref 8.9–10.3)
Chloride: 104 mmol/L (ref 101–111)
GFR, EST AFRICAN AMERICAN: 31 mL/min — AB (ref >60)
GFR, EST NON AFRICAN AMERICAN: 27 mL/min — AB (ref >60)
Glucose, Bld: 127 mg/dL — ABNORMAL HIGH (ref 65–99)
Potassium: 4 mmol/L (ref 3.5–5.1)
Sodium: 131 mmol/L — ABNORMAL LOW (ref 135–145)
TOTAL PROTEIN: 4.8 g/dL — AB (ref 6.5–8.1)

## 2015-01-27 LAB — URINE CULTURE: CULTURE: NO GROWTH

## 2015-01-27 LAB — PROTIME-INR
INR: 1.82 — ABNORMAL HIGH (ref 0.00–1.49)
PROTHROMBIN TIME: 21 s — AB (ref 11.6–15.2)

## 2015-01-27 LAB — MAGNESIUM: MAGNESIUM: 1.7 mg/dL (ref 1.7–2.4)

## 2015-01-27 MED ORDER — LIDOCAINE HCL (PF) 1 % IJ SOLN
INTRAMUSCULAR | Status: AC
  Filled 2015-01-27: qty 10

## 2015-01-27 MED ORDER — ONDANSETRON 4 MG PO TBDP
4.0000 mg | ORAL_TABLET | Freq: Three times a day (TID) | ORAL | Status: DC | PRN
  Administered 2015-01-27 – 2015-02-02 (×4): 4 mg via ORAL
  Filled 2015-01-27 (×4): qty 1

## 2015-01-27 NOTE — Evaluation (Signed)
Physical Therapy Evaluation Patient Details Name: [name] MRN: [id number] DOB: Jan 17, 1956 Today's Date: 01/27/2015   History of Present Illness  59 y.o. WM PMHx Depression, Bipolar affect, Alcohol Abuse, Cocaine abuse, Cirrhosis of liver on transplant list with Chronic Anasarca/Ascites, Diabetes Mellitus, Hypertension, COPD, CVA, Chronic pancytopenia, CKD(baseline Cr 1.6-2.0), GERD. Pt just discharged on 01/23/2015 . Admitted with pain and ascites  Clinical Impression  Pt with flat affect, decreased cognition, balance, problem solving, safety awareness and function. He requires assist for all mobility and at time physical assist of 2 person min assist for positioning and movement due to lack of awareness and attempts to sit prematurely. Pt is not safe to return home without 24 hr assist and recommend SNF. Pt will benefit from acute therapy to maximize mobility, gait, strength, function and safety to decrease burden of care and fall risk.     Follow Up Recommendations SNF;Supervision/Assistance - 24 hour    Equipment Recommendations  None recommended by PT    Recommendations for Other Services       Precautions / Restrictions Precautions Precautions: Fall Precaution Comments: very swallon scrotum      Mobility  Bed Mobility Overal bed mobility: Needs Assistance Bed Mobility: Supine to Sit     Supine to sit: Min assist     General bed mobility comments: wtih rail and HOB elevated 20 degrees min assist to fully transfer to sitting and scoot to EOB with assist of pad  Transfers Overall transfer level: Needs assistance   Transfers: Sit to/from Stand Sit to Stand: Min assist;+2 safety/equipment         General transfer comment: pt stood from bed and commode with cues for hand placement, assist for anterior translation and safety. Max multimodal cues to position self infront of surface prior to sitting  Ambulation/Gait Ambulation/Gait assistance: Min assist;+2  safety/equipment Ambulation Distance (Feet): 24 Feet Assistive device: Rolling walker (2 wheeled) Gait Pattern/deviations: Wide base of support;Trunk flexed;Shuffle   Gait velocity interpretation: Below normal speed for age/gender General Gait Details: pt with wide BOS, flexed posture, walker too far forward and max multimodal cues to step into RW, direct RW and for safety. pt with limited command following  Stairs            Wheelchair Mobility    Modified Rankin (Stroke Patients Only)       Balance Overall balance assessment: Needs assistance   Sitting balance-Leahy Scale: Fair       Standing balance-Leahy Scale: Poor                               Pertinent Vitals/Pain Faces Pain Scale: Hurts even more Pain Location: scrotal pain Pain Intervention(s): Repositioned  HR 92 sats 99% on RA BP in standing 139/69    Home Living Family/patient expects to be discharged to:: Private residence Living Arrangements: Alone Available Help at Discharge: Family;Available PRN/intermittently Type of Home: Apartment Home Access: Level entry     Home Layout: One level Home Equipment: Walker - 2 wheels;Bedside commode;Tub bench;Hand held shower head      Prior Function     Gait / Transfers Assistance Needed: uses RW at home, limited gait     Comments: h/o falls     Hand Dominance        Extremity/Trunk Assessment   Upper Extremity Assessment: Defer to OT evaluation           Lower Extremity Assessment: Generalized  weakness (edema limiting ROM along with scrotal swelling)      Cervical / Trunk Assessment: Normal  Communication   Communication: No difficulties  Cognition Arousal/Alertness: Awake/alert Behavior During Therapy: Flat affect Overall Cognitive Status: Impaired/Different from baseline Area of Impairment: Following commands;Safety/judgement;Problem solving       Following Commands: Follows one step commands with increased  time;Follows one step commands inconsistently Safety/Judgement: Decreased awareness of safety;Decreased awareness of deficits   Problem Solving: Slow processing;Decreased initiation;Requires verbal cues;Requires tactile cues      General Comments      Exercises        Assessment/Plan    PT Assessment Patient needs continued PT services  PT Diagnosis Difficulty walking;Acute pain;Altered mental status;Generalized weakness   PT Problem List Decreased strength;Decreased range of motion;Decreased activity tolerance;Decreased mobility;Decreased balance;Decreased knowledge of use of DME;Obesity;Decreased safety awareness;Pain;Decreased cognition  PT Treatment Interventions DME instruction;Gait training;Functional mobility training;Therapeutic activities;Therapeutic exercise;Balance training;Patient/family education;Cognitive remediation   PT Goals (Current goals can be found in the Care Plan section) Acute Rehab PT Goals Patient Stated Goal: be able to return home  PT Goal Formulation: With patient Time For Goal Achievement: 02/10/15 Potential to Achieve Goals: Fair    Frequency Min 3X/week   Barriers to discharge Decreased caregiver support      Co-evaluation PT/OT/SLP Co-Evaluation/Treatment: Yes Reason for Co-Treatment: Necessary to address cognition/behavior during functional activity;For patient/therapist safety PT goals addressed during session: Balance;Proper use of DME;Mobility/safety with mobility         End of Session Equipment Utilized During Treatment: Gait belt Activity Tolerance: Patient tolerated treatment well Patient left: in chair;with call bell/phone within reach;with chair alarm set Nurse Communication: Mobility status;Precautions         Time: 5697-9480 PT Time Calculation (min) (ACUTE ONLY): 30 min   Charges:   PT Evaluation $Initial PT Evaluation Tier I: 1 Procedure     PT G CodesMelford Aase 01/27/2015, 9:45 AM  Elwyn Reach, [place]

## 2015-01-27 NOTE — Progress Notes (Signed)
Initial Nutrition Assessment  DOCUMENTATION CODES:   Obesity unspecified  INTERVENTION:   -Continue Boost Breeze po TID, each supplement provides 250 kcal and 9 grams of protein -Continue 30 ml Prostat BID  NUTRITION DIAGNOSIS:   Increased nutrient needs related to chronic illness as evidenced by estimated needs.  GOAL:   Patient will meet greater than or equal to 90% of their needs  MONITOR:   PO intake, Supplement acceptance, Labs, Diet advancement, Weight trends, Skin, I & O's  REASON FOR ASSESSMENT:   Low Braden    ASSESSMENT:   [name] is a 59 y.o. WM PMHx Depression, Bipolar affect, Alcohol Abuse, Cocaine abuse, Cirrhosis of liver on transplant list with Chronic Anasarca/Ascites, Diabetes Mellitus, Hypertension, COPD, CVA, Chronic pancytopenia, CKD(baseline Cr 1.6-2.0), GERD. Frequent flyer who's just discharged on 01/23/2015  Pt admitted with advanced liver cirrhosis with anasarca.   Pt out of room for paracentesis at time of visit. Unable to complete nutrition-focused physical exam at this time.   Pt familiar to this RD due to multiple previous admissions. Wt trends are difficult to assess due to fluctuations due to ascites. Noted overall wt gain trend over the past 6 months.    Boost Breeze and Prostat supplements ordered. Per past RD notes, pt enjoys Boost Breeze supplements. RD will continue supplement orders. Pt also tolerating diet well; noted 100% completion of Carb Modified diet this morning.   Labs reviewed: Na: 131, AST: 76.  Diet Order:  Diet Carb Modified Fluid consistency:: Thin; Room service appropriate?: Yes  Skin:  Wound (see comment) (skin tears to lt hip, rt knee, rt arm)  Last BM:  01/27/15  Height:   Ht Readings from Last 1 Encounters:  01/25/15 5\' 11"  (1.803 m)    Weight:   Wt Readings from Last 1 Encounters:  01/27/15 254 lb 3.1 oz (115.3 kg)    Ideal Body Weight:  78.2 kg  BMI:  Body mass index is 35.47  kg/(m^2).  Estimated Nutritional Needs:   Kcal:  2100-2300  Protein:  115-130 grams  Fluid:  >2.1 L  EDUCATION NEEDS:   No education needs identified at this time   Richar Dunklee A. Jimmye Norman, RD, LDN, CDE Pager: [phone] After hours Pager: [phone]

## 2015-01-27 NOTE — Procedures (Signed)
RLQ paracentesis No comp/EBL 

## 2015-01-27 NOTE — Progress Notes (Signed)
[facility] PROGRESS NOTE  [name] FGH:[id number] DOB: [birth year] DOA: 01/25/2015 PCP: Barbette Merino, MD  Brief Narrative: Patient is a 59 year old with history of alcoholic liver cirrhosis presenting with abdominal discomfort and SIRs.  Assessment/Plan: Active Problems:   SIRS - No source of infection identified patient is presumably treated with antibiotics. - We'll assess peritoneal fluid for signs of infection - Patient is currently on Rocephin - urine and blood cx pending. - Ascitic fluid culture obtained 01/27/15    Anasarca - s/p paracentesis    ALC (alcoholic liver cirrhosis) - Leading to problem # 2 - stable - continue rifaximin and lactulose  Alcohol abuse - no alcohol associated anxiety noticed on exam today.    Hypotension -  Hypotension has resolved, soft blood pressures as such holding spironolactone and blood pressure medication.    Protein-calorie malnutrition, severe   - currently on nutritional supplementation.  Nausea - provide antiemetics   Code Status: DNR Family Communication: no family at bedside. Disposition Plan: Pending improvement in condition.   Consultants:  IR  Procedures:  Paracentesis  Antibiotics:  Rocephin and Xifaxan  HPI/Subjective: Pt was complaining of nausea  Objective: Filed Vitals:   01/27/15 1200  BP:   Pulse: 80  Temp:   Resp: 13    Intake/Output Summary (Last 24 hours) at 01/27/15 1258 Last data filed at 01/27/15 1100  Gross per 24 hour  Intake 998.17 ml  Output   1353 ml  Net -354.83 ml   Filed Weights   01/25/15 2317 01/26/15 0600 01/27/15 0327  Weight: 114.3 kg (251 lb 15.8 oz) 115.9 kg (255 lb 8.2 oz) 115.3 kg (254 lb 3.1 oz)    Exam:   General:  Pt in nad, alert and awake  Cardiovascular: no cyanosis  Respiratory: no audible wheezes, equal chest rise,  Abdomen: distended, no guarding  Musculoskeletal: no cyanosis or clubbing   Data Reviewed: Basic Metabolic  Panel:  Recent Labs Lab 01/21/15 0605 01/22/15 0719 01/25/15 1607 01/26/15 0820 01/27/15 0218  NA 131* 129* 129* 129* 131*  K 3.9 3.8 3.7 3.6 4.0  CL 105 104 105 102 104  CO2 19* 19* 18* 18* 20*  GLUCOSE 141* 137* 170* 108* 127*  BUN 45* 46* 53* 57* 59*  CREATININE 1.64* 1.53* 2.07* 2.18* 2.49*  CALCIUM 7.6* 7.8* 8.1* 7.8* 8.1*  MG  --   --   --  1.7 1.7   Liver Function Tests:  Recent Labs Lab 01/21/15 0605 01/25/15 1607 01/26/15 0820 01/27/15 0218  AST 68* 105* 88* 76*  ALT 37 54 46 41  ALKPHOS 128* 148* 132* 121  BILITOT 2.3* 2.4* 2.3* 2.4*  PROT 5.2* 5.1* 4.6* 4.8*  ALBUMIN 2.4* 2.1* 1.8* 2.5*    Recent Labs Lab 01/25/15 1607  LIPASE 30    Recent Labs Lab 01/25/15 1623  AMMONIA 89*   CBC:  Recent Labs Lab 01/21/15 0605 01/22/15 0719 01/25/15 1607 01/26/15 0008  WBC 5.1 4.7 5.7 5.7  NEUTROABS  --   --  4.1 4.1  HGB 8.5* 8.3* 8.3* 8.6*  HCT 25.2* 24.9* 25.1* 25.3*  MCV 92.6 92.2 91.9 92.0  PLT 59* 72* 51* 49*   Cardiac Enzymes: No results for input(s): CKTOTAL, CKMB, CKMBINDEX, TROPONINI in the last 168 hours. BNP (last 3 results)  Recent Labs  11/29/14 1145 01/15/15 0010 01/17/15 1815  BNP 29.4 46.4 75.3    ProBNP (last 3 results)  Recent Labs  02/08/14 1830 02/10/14 1547  PROBNP 966.3* 805.5*  CBG:  Recent Labs Lab 01/26/15 0757 01/26/15 1138 01/26/15 1610 01/26/15 2238 01/27/15 0743  GLUCAP 113* 104* 82 113* 120*    Recent Results (from the past 240 hour(s))  MRSA PCR Screening     Status: Abnormal   Collection Time: 01/17/15  4:50 PM  Result Value Ref Range Status   MRSA by PCR POSITIVE (A) NEGATIVE Final    Comment:        The GeneXpert MRSA Assay (FDA approved for NASAL specimens only), is one component of a comprehensive MRSA colonization surveillance program. It is not intended to diagnose MRSA infection nor to guide or monitor treatment for MRSA infections. RESULT CALLED TO, READ BACK BY AND  VERIFIED WITH: G.GARLAND,RN AT 2040 ON 01/17/15 BY W.SHEA   Culture, blood (routine x 2)     Status: None   Collection Time: 01/17/15  6:15 PM  Result Value Ref Range Status   Specimen Description BLOOD RIGHT ARM  Final   Special Requests BOTTLES DRAWN AEROBIC ONLY [place]  Final   Culture   Final    NO GROWTH 5 DAYS Performed at [facility]    Report Status 01/23/2015 FINAL  Final  Culture, blood (routine x 2)     Status: None   Collection Time: 01/17/15  6:20 PM  Result Value Ref Range Status   Specimen Description BLOOD LEFT HAND  Final   Special Requests BOTTLES DRAWN AEROBIC ONLY 10CC  Final   Culture   Final    NO GROWTH 5 DAYS Performed at [facility]    Report Status 01/23/2015 FINAL  Final  Body fluid culture     Status: None   Collection Time: 01/19/15 11:06 AM  Result Value Ref Range Status   Specimen Description PERITONEAL  Final   Special Requests NONE  Final   Gram Stain   Final    FEW WBC PRESENT,BOTH PMN AND MONONUCLEAR NO ORGANISMS SEEN    Culture   Final    NO GROWTH 3 DAYS Performed at [facility]    Report Status 01/22/2015 FINAL  Final  Blood culture (routine x 2)     Status: None (Preliminary result)   Collection Time: 01/25/15  4:08 PM  Result Value Ref Range Status   Specimen Description BLOOD RIGHT FOREARM  Final   Special Requests BOTTLES DRAWN AEROBIC AND ANAEROBIC 5CC  Final   Culture NO GROWTH < 24 HOURS  Final   Report Status PENDING  Incomplete  Blood culture (routine x 2)     Status: None (Preliminary result)   Collection Time: 01/25/15  4:23 PM  Result Value Ref Range Status   Specimen Description BLOOD LEFT HAND  Final   Special Requests BOTTLES DRAWN AEROBIC AND ANAEROBIC 5CC  Final   Culture NO GROWTH < 24 HOURS  Final   Report Status PENDING  Incomplete  Body fluid culture     Status: None (Preliminary result)   Collection Time: 01/25/15  9:27 PM  Result Value Ref Range Status   Specimen Description FLUID  PERITONEAL  Final   Special Requests Normal  Final   Gram Stain   Final    FEW WBC PRESENT,BOTH PMN AND MONONUCLEAR NO ORGANISMS SEEN    Culture NO GROWTH 2 DAYS  Final   Report Status PENDING  Incomplete  Urine culture     Status: None   Collection Time: 01/25/15 11:00 PM  Result Value Ref Range Status   Specimen Description URINE, CATHETERIZED  Final   Special Requests NONE  Final   Culture NO GROWTH 1 DAY  Final   Report Status 01/27/2015 FINAL  Final  MRSA PCR Screening     Status: Abnormal   Collection Time: 01/25/15 11:00 PM  Result Value Ref Range Status   MRSA by PCR POSITIVE (A) NEGATIVE Final    Comment:        The GeneXpert MRSA Assay (FDA approved for NASAL specimens only), is one component of a comprehensive MRSA colonization surveillance program. It is not intended to diagnose MRSA infection nor to guide or monitor treatment for MRSA infections. RESULT CALLED TO, READ BACK BY AND VERIFIED WITH: RN ANN Charlesetta Garibaldi [id number] @0045  THANEY      Studies: US Paracentesis  01/27/2015   CLINICAL DATA:  Ascites  EXAM: ULTRASOUND GUIDED PARACENTESIS  TECHNIQUE: Survey ultrasound of the abdomen was performed and an appropriate skin entry site in the right lower abdomen was selected. Skin site was marked, prepped with Betadine, and draped in usual sterile fashion, and infiltrated locally with 1% lidocaine. A 5 French multisidehole Yueh sheath needle was advanced into the peritoneal space until fluid could be aspirated. The sheath was advanced and the needle removed. Four L of clearascites were aspirated.  COMPLICATIONS: None  IMPRESSION: Technically successful ultrasound guided paracentesis, removing 4 L of ascites.   Electronically Signed   By: Marybelle Killings M.D.   On: 01/27/2015 11:18   Dg Chest Portable 1 View  01/25/2015   CLINICAL DATA:  Abdominal pain with chronic ascites.  EXAM: PORTABLE CHEST - 1 VIEW  COMPARISON:  01/20/2015  FINDINGS: The heart size and mediastinal contours  are within normal limits. Both lungs are clear. The visualized skeletal structures are unremarkable.  IMPRESSION: No active disease.   Electronically Signed   By: Kathreen Devoid   On: 01/25/2015 16:09    Scheduled Meds: . amitriptyline  25 mg Oral QHS  . aspirin EC  81 mg Oral Daily  . cefTRIAXone (ROCEPHIN)  IV  1 g Intravenous Q24H  . Chlorhexidine Gluconate Cloth  6 each Topical Q0600  . clonazePAM  0.25 mg Oral BID  . collagenase   Topical Daily  . feeding supplement  1 Container Oral TID BM  . feeding supplement (PRO-STAT SUGAR FREE 64)  30 mL Oral BID  . folic acid  1 mg Oral Daily  . insulin aspart  0-9 Units Subcutaneous TID WC  . lactulose  30 g Oral TID  . lidocaine (PF)      . magnesium oxide  400 mg Oral BID  . mupirocin ointment  1 application Nasal BID  . oxyCODONE  10 mg Oral TID  . pantoprazole  40 mg Oral Daily  . rifaximin  550 mg Oral BID  . sodium chloride  500 mL Intravenous Once  . thiamine  100 mg Oral Daily   Continuous Infusions:   Time spent: > 35 minutes  Velvet Bathe  [facility] Pager [phone] If 7PM-7AM, please contact night-coverage at [email or web address], password [facility] 01/27/2015, 12:58 PM  LOS: 2 days

## 2015-01-27 NOTE — Evaluation (Signed)
Occupational Therapy Evaluation Patient Details Name: [name] MRN: [id number] DOB: 07-23-1955 Today's Date: 01/27/2015    History of Present Illness 59 y.o. WM PMHx Depression, Bipolar affect, Alcohol Abuse, Cocaine abuse, Cirrhosis of liver on transplant list with Chronic Anasarca/Ascites, Diabetes Mellitus, Hypertension, COPD, CVA, Chronic pancytopenia, CKD(baseline Cr 1.6-2.0), GERD. Pt just discharged on 01/23/2015 . Admitted with pain and ascites   Clinical Impression   This 59 yo male admitted with above presents to acute OT with decreased cognition, decreased balance, decreased mobility, obesity, all affecting his ability to care for himself at home as he was trying to do prior to admission. He will benefit from acute OT with follow up OT at SNF to work on getting back to where he can manage himself at home.    Follow Up Recommendations  SNF    Equipment Recommendations   (TBD at SNF)       Precautions / Restrictions Precautions Precautions: Fall Precaution Comments: very [place] scrotum Restrictions Weight Bearing Restrictions: No      Mobility Bed Mobility Overal bed mobility: Needs Assistance Bed Mobility: Supine to Sit     Supine to sit: Min assist     General bed mobility comments: wtih rail and HOB elevated 20 degrees min assist to fully transfer to sitting and scoot to EOB with assist of pad  Transfers Overall transfer level: Needs assistance Equipment used: Rolling walker (2 wheeled) Transfers: Sit to/from Stand Sit to Stand: Min assist;+2 safety/equipment         General transfer comment: pt stood from bed and commode with cues for hand placement, assist for anterior translation and safety. Max multimodal cues to position self infront of surface prior to sitting    Balance Overall balance assessment: Needs assistance Sitting-balance support: No upper extremity supported Sitting balance-Leahy Scale: Fair     Standing balance support:  Bilateral upper extremity supported Standing balance-Leahy Scale: Poor                              ADL Overall ADL's : Needs assistance/impaired Eating/Feeding: Sitting;Supervision/ safety;Set up   Grooming: Set up;Sitting;Supervision/safety   Upper Body Bathing: Set up;Sitting;Supervision/ safety   Lower Body Bathing:  (with Min A +2 for safety for sit<>stand)   Upper Body Dressing : Moderate assistance;Sitting   Lower Body Dressing: Total assistance (with Min A +2 for safety for sit<>stand)   Toilet Transfer: Minimal assistance;+2 for safety/equipment;Ambulation;RW;Comfort height toilet;Grab bars   Toileting- Clothing Manipulation and Hygiene: Total assistance (with Min A +2 for safety for sit<>stand)               Vision Additional Comments: No change from baseline, wears glasses for reading          Pertinent Vitals/Pain Faces Pain Scale: Hurts even more Pain Location: scrotum Pain Descriptors / Indicators: Sore Pain Intervention(s): Repositioned     Hand Dominance Right   Extremity/Trunk Assessment Upper Extremity Assessment Upper Extremity Assessment: Generalized weakness   Lower Extremity Assessment Lower Extremity Assessment: Generalized weakness (edema limiting ROM along with scrotal swelling)   Cervical / Trunk Assessment Cervical / Trunk Assessment: Normal   Communication Communication Communication: No difficulties   Cognition Arousal/Alertness: Awake/alert Behavior During Therapy: Flat affect Overall Cognitive Status: Impaired/Different from baseline Area of Impairment: Following commands;Safety/judgement;Problem solving       Following Commands: Follows one step commands with increased time;Follows one step commands inconsistently Safety/Judgement: Decreased awareness of safety;Decreased awareness  of deficits   Problem Solving: Slow processing;Decreased initiation;Requires verbal cues;Requires tactile cues                 Home Living Family/patient expects to be discharged to:: Skilled nursing facility Living Arrangements: Alone Available Help at Discharge: Family;Available PRN/intermittently Type of Home: Apartment Home Access: Level entry     Home Layout: One level     Bathroom Shower/Tub: Tub/shower unit;Curtain   Biochemist, clinical: Standard     Home Equipment: Environmental consultant - 2 wheels;Bedside commode;Tub bench;Hand held shower head          Prior Functioning/Environment Level of Independence: Needs assistance  Gait / Transfers Assistance Needed: uses RW at home, limited gait     Comments: h/o falls    OT Diagnosis: Generalized weakness;Cognitive deficits   OT Problem List: Decreased strength;Impaired balance (sitting and/or standing);Decreased cognition;Decreased safety awareness;Obesity;Decreased knowledge of use of DME or AE   OT Treatment/Interventions: Self-care/ADL training;Therapeutic activities;DME and/or AE instruction;Patient/family education;Balance training    OT Goals(Current goals can be found in the care plan section) Acute Rehab OT Goals Patient Stated Goal: be able to return home  OT Goal Formulation: With patient Time For Goal Achievement: 02/10/15 Potential to Achieve Goals: Good  OT Frequency: Min 2X/week   Barriers to D/C: Decreased caregiver support          Co-evaluation PT/OT/SLP Co-Evaluation/Treatment: Yes Reason for Co-Treatment: Necessary to address cognition/behavior during functional activity;For patient/therapist safety PT goals addressed during session: Balance;Proper use of DME;Mobility/safety with mobility OT goals addressed during session: ADL's and self-care;Strengthening/ROM      End of Session Equipment Utilized During Treatment: Gait belt;Rolling walker Nurse Communication:  (pt asking for something for nausea--RN contacting MD)  Activity Tolerance: Patient tolerated treatment well Patient left: in chair;with call bell/phone within  reach;with chair alarm set   Time: 2725-3664 OT Time Calculation (min): 36 min Charges:  OT General Charges $OT Visit: 1 Procedure OT Evaluation $Initial OT Evaluation Tier I: 1 Procedure    Almon Register [phone] 01/27/2015, 11:51 AM

## 2015-01-28 ENCOUNTER — Inpatient Hospital Stay (HOSPITAL_COMMUNITY): Payer: Medicare PPO

## 2015-01-28 DIAGNOSIS — E43 Unspecified severe protein-calorie malnutrition: Secondary | ICD-10-CM

## 2015-01-28 DIAGNOSIS — K7031 Alcoholic cirrhosis of liver with ascites: Secondary | ICD-10-CM

## 2015-01-28 DIAGNOSIS — N179 Acute kidney failure, unspecified: Secondary | ICD-10-CM

## 2015-01-28 DIAGNOSIS — R601 Generalized edema: Secondary | ICD-10-CM

## 2015-01-28 DIAGNOSIS — Z515 Encounter for palliative care: Secondary | ICD-10-CM | POA: Insufficient documentation

## 2015-01-28 LAB — MAGNESIUM: MAGNESIUM: 1.7 mg/dL (ref 1.7–2.4)

## 2015-01-28 LAB — GLUCOSE, CAPILLARY
GLUCOSE-CAPILLARY: 166 mg/dL — AB (ref 65–99)
GLUCOSE-CAPILLARY: 159 mg/dL — AB (ref 65–99)
Glucose-Capillary: 171 mg/dL — ABNORMAL HIGH (ref 65–99)
Glucose-Capillary: 193 mg/dL — ABNORMAL HIGH (ref 65–99)
Glucose-Capillary: 149 mg/dL — ABNORMAL HIGH (ref 65–99)

## 2015-01-28 LAB — PROTIME-INR
INR: 1.88 — ABNORMAL HIGH (ref 0.00–1.49)
PROTHROMBIN TIME: 21.6 s — AB (ref 11.6–15.2)

## 2015-01-28 LAB — COMPREHENSIVE METABOLIC PANEL
ALT: 42 U/L (ref 17–63)
AST: 74 U/L — AB (ref 15–41)
Albumin: 2.3 g/dL — ABNORMAL LOW (ref 3.5–5.0)
Alkaline Phosphatase: 125 U/L (ref 38–126)
Anion gap: 10 (ref 5–15)
BUN: 62 mg/dL — ABNORMAL HIGH (ref 6–20)
CHLORIDE: 100 mmol/L — AB (ref 101–111)
CO2: 19 mmol/L — AB (ref 22–32)
Calcium: 7.9 mg/dL — ABNORMAL LOW (ref 8.9–10.3)
Creatinine, Ser: 2.72 mg/dL — ABNORMAL HIGH (ref 0.61–1.24)
GFR, EST AFRICAN AMERICAN: 28 mL/min — AB (ref >60)
GFR, EST NON AFRICAN AMERICAN: 24 mL/min — AB (ref >60)
Glucose, Bld: 149 mg/dL — ABNORMAL HIGH (ref 65–99)
POTASSIUM: 3.9 mmol/L (ref 3.5–5.1)
Sodium: 129 mmol/L — ABNORMAL LOW (ref 135–145)
Total Bilirubin: 1.6 mg/dL — ABNORMAL HIGH (ref 0.3–1.2)
Total Protein: 4.8 g/dL — ABNORMAL LOW (ref 6.5–8.1)

## 2015-01-28 LAB — CREATININE, URINE, RANDOM: Creatinine, Urine: 156.89 mg/dL

## 2015-01-28 LAB — URINALYSIS, ROUTINE W REFLEX MICROSCOPIC
BILIRUBIN URINE: NEGATIVE
GLUCOSE, UA: NEGATIVE mg/dL
KETONES UR: NEGATIVE mg/dL
Nitrite: NEGATIVE
PH: 5 (ref 5.0–8.0)
PROTEIN: NEGATIVE mg/dL
Specific Gravity, Urine: 1.014 (ref 1.005–1.030)
Urobilinogen, UA: 0.2 mg/dL (ref 0.0–1.0)

## 2015-01-28 LAB — SODIUM, URINE, RANDOM: Sodium, Ur: 11 mmol/L

## 2015-01-28 LAB — URINE MICROSCOPIC-ADD ON

## 2015-01-28 MED ORDER — ALBUMIN HUMAN 5 % IV SOLN
100.0000 g | Freq: Every day | INTRAVENOUS | Status: AC
  Administered 2015-01-28: 100 g via INTRAVENOUS
  Administered 2015-01-29: 12.5 g via INTRAVENOUS
  Filled 2015-01-28 (×4): qty 2000

## 2015-01-28 MED ORDER — AMITRIPTYLINE HCL 10 MG PO TABS
10.0000 mg | ORAL_TABLET | Freq: Every day | ORAL | Status: DC
  Administered 2015-01-28 – 2015-02-01 (×5): 10 mg via ORAL
  Filled 2015-01-28 (×6): qty 1

## 2015-01-28 MED ORDER — ALBUMIN HUMAN 5 % IV SOLN
100.0000 g | Freq: Every day | INTRAVENOUS | Status: DC
  Filled 2015-01-28: qty 2000

## 2015-01-28 MED ORDER — OCTREOTIDE ACETATE 100 MCG/ML IJ SOLN
100.0000 ug | Freq: Three times a day (TID) | INTRAMUSCULAR | Status: DC
  Administered 2015-01-28 – 2015-01-30 (×8): 100 ug via SUBCUTANEOUS
  Filled 2015-01-28 (×14): qty 1

## 2015-01-28 MED ORDER — OXYCODONE HCL 5 MG PO TABS
5.0000 mg | ORAL_TABLET | Freq: Four times a day (QID) | ORAL | Status: DC | PRN
  Administered 2015-01-30 – 2015-02-02 (×8): 5 mg via ORAL
  Filled 2015-01-28 (×9): qty 1

## 2015-01-28 MED ORDER — LACTULOSE ENEMA
300.0000 mL | Freq: Two times a day (BID) | ORAL | Status: DC
  Administered 2015-01-28: 300 mL via RECTAL
  Filled 2015-01-28 (×10): qty 300

## 2015-01-28 MED ORDER — METRONIDAZOLE IN NACL 5-0.79 MG/ML-% IV SOLN
500.0000 mg | Freq: Three times a day (TID) | INTRAVENOUS | Status: DC
  Administered 2015-01-28 – 2015-02-01 (×12): 500 mg via INTRAVENOUS
  Filled 2015-01-28 (×12): qty 100

## 2015-01-28 MED ORDER — FUROSEMIDE 10 MG/ML IJ SOLN
40.0000 mg | Freq: Three times a day (TID) | INTRAMUSCULAR | Status: DC
  Administered 2015-01-28 – 2015-01-29 (×3): 40 mg via INTRAVENOUS
  Filled 2015-01-28 (×3): qty 4

## 2015-01-28 MED ORDER — LORAZEPAM 0.5 MG PO TABS
0.5000 mg | ORAL_TABLET | Freq: Four times a day (QID) | ORAL | Status: DC | PRN
  Administered 2015-01-31: 0.5 mg via ORAL
  Filled 2015-01-28: qty 1

## 2015-01-28 MED ORDER — MIDODRINE HCL 5 MG PO TABS
7.5000 mg | ORAL_TABLET | Freq: Three times a day (TID) | ORAL | Status: DC
  Administered 2015-01-28 (×3): 7.5 mg via ORAL
  Filled 2015-01-28 (×3): qty 2

## 2015-01-28 NOTE — Progress Notes (Addendum)
[facility] PROGRESS NOTE  ERVING SASSANO LFY:[id number] DOB: 1955/09/20 DOA: 01/25/2015 PCP: Barbette Merino, MD  Brief Summary  Patient is a 59 year old with history of alcoholic liver cirrhosis presenting with abdominal discomfort and SIRs.  He has had 9 admissions in the last 6 months and 5 ER visits.  Most admissions have been for hepatic encephalopathy and falls, however, in July he had GIB.  On several admissions, he required intubation for his encephalopathy.  He has been noncompliant with his medications, including diuretics.  He continues to require large volume paracenteses, sometimes every few days.   He continues to use cocaine and is on narcotics for chronic pain and clonazepam for "bipolar" all of which likely contribute to his encephalopathy.    Assessment/Plan  Alcoholic liver cirrhosis with portal hypertensive gastropathy, hx of GIB, recurrent hepatic encephalopathy, currently with confusion and slowed mentation consistent with hepatic encephalopathy.  MELD ([facility] and [facility]) 25.  All alcohol levels have been less than threshold for last two years.   -  continue rifaximin and lactulose -  Only one stool yesterday and declining lactulose frequently according to flow sheet -  May need to start lactulose enemas -  Stop/minimize sedating medications   AKI possibly due to ATN from third spacing and hypotension, but concerning for hepatorenal syndrome.  Uop 648 yesterday.  Explained to patient that his kidneys are failing and that this may be related to his liver disease and incurable.  He stated that he "knew this would happen eventually"   -  RUS -  FENa and UA -  Start albumin, octreotide, and midodrine -  Nephrology consultation -  Palliative care consultation  SIRS with rales and rhonchi on exam concerning for aspiration pneumonia -  Cell count not obtained on paracentesis, but culture is currently negative at 3 days, so I doubt this was the source -  Add flagyl to ceftriaxone  for probably aspiration pneumonia -  Speech therapy consultation for swallow eval -  Blood cultures NGTD  Anasarca and hyponatremia due to cirrhosis -  s/p paracentesis -  Hold diuretics due to kidney function  Hyperglycemia -  Check A1c -  Continue SSI pending A1c  Alcohol abuse - no alcohol associated anxiety noticed on exam today. -  All alcohol levels have been less than threshold for last two years.    Hypotension - Hypotension has resolved, soft blood pressures as such holding spironolactone and blood pressure medication.    Protein-calorie malnutrition, severe  - currently on nutritional supplementation.  Nausea - provide antiemetics  Normocytic anemia likely due to recent blood loss and renal failure -  TSH recently wnl -  Repeat iron studies, b12, folate  Diet:  Diabetic  Access:  PIV IVF:  albumin Proph:  SCDs  Code Status: DNR Family Communication: patient alone Disposition Plan: pending improvement in kidney function.  [facility] would be an excellent candidate for hospice, particularly if his kidney function improves.     Consultants:  IR  Palliative care  Nephrology  Procedures:  Paracentesis  Antibiotics:  Rocephin and Xifaxan  Flagyl added on 9/7  HPI/Subjective:    Objective: Filed Vitals:   01/28/15 0428 01/28/15 0800 01/28/15 0916 01/28/15 1156  BP: 126/68 132/90 136/88 106/57  Pulse:   75   Temp: 98.8 F (37.1 C)  99.2 F (37.3 C) 99.1 F (37.3 C)  TempSrc: Oral  Oral Oral  Resp: 15 18 18    Height:      Weight: 111.8 kg (246  lb 7.6 oz)     SpO2: 99%  100% 100%    Intake/Output Summary (Last 24 hours) at 01/28/15 1254 Last data filed at 01/28/15 1125  Gross per 24 hour  Intake   2870 ml  Output   1070 ml  Net   1800 ml   Filed Weights   01/26/15 0600 01/27/15 0327 01/28/15 0428  Weight: 115.9 kg (255 lb 8.2 oz) 115.3 kg (254 lb 3.1 oz) 111.8 kg (246 lb 7.6 oz)   Body mass index is 34.39  kg/(m^2).  Exam:   General:  Adult male, No acute distress, slow to speech and confused, cachexia around the temples  HEENT:  NCAT, MMM  Cardiovascular:  RRR, nl S1, S2 2/6 systolic murmur, 2+ pulses, warm extremities  Respiratory:  Bilateral rales with rhonchi, frequent cough, no increased WOB  Abdomen:   NABS, soft, moderately distended, firm but not tense, diffusely tender to palpation without rebound or guarding  MSK:   Normal tone and bulk, massively volume overloaded on his legs, 3+ pitting edema in dependent areas  Neuro:  Positive asterixis, moves all extremities approximately equally  Data Reviewed: Basic Metabolic Panel:  Recent Labs Lab 01/22/15 0719 01/25/15 1607 01/26/15 0820 01/27/15 0218 01/28/15 0246  NA 129* 129* 129* 131* 129*  K 3.8 3.7 3.6 4.0 3.9  CL 104 105 102 104 100*  CO2 19* 18* 18* 20* 19*  GLUCOSE 137* 170* 108* 127* 149*  BUN 46* 53* 57* 59* 62*  CREATININE 1.53* 2.07* 2.18* 2.49* 2.72*  CALCIUM 7.8* 8.1* 7.8* 8.1* 7.9*  MG  --   --  1.7 1.7 1.7   Liver Function Tests:  Recent Labs Lab 01/25/15 1607 01/26/15 0820 01/27/15 0218 01/28/15 0246  AST 105* 88* 76* 74*  ALT 54 46 41 42  ALKPHOS 148* 132* 121 125  BILITOT 2.4* 2.3* 2.4* 1.6*  PROT 5.1* 4.6* 4.8* 4.8*  ALBUMIN 2.1* 1.8* 2.5* 2.3*    Recent Labs Lab 01/25/15 1607  LIPASE 30    Recent Labs Lab 01/25/15 1623  AMMONIA 89*   CBC:  Recent Labs Lab 01/22/15 0719 01/25/15 1607 01/26/15 0008  WBC 4.7 5.7 5.7  NEUTROABS  --  4.1 4.1  HGB 8.3* 8.3* 8.6*  HCT 24.9* 25.1* 25.3*  MCV 92.2 91.9 92.0  PLT 72* 51* 49*    Recent Results (from the past 240 hour(s))  Body fluid culture     Status: None   Collection Time: 01/19/15 11:06 AM  Result Value Ref Range Status   Specimen Description PERITONEAL  Final   Special Requests NONE  Final   Gram Stain   Final    FEW WBC PRESENT,BOTH PMN AND MONONUCLEAR NO ORGANISMS SEEN    Culture   Final    NO GROWTH 3  DAYS Performed at [facility]    Report Status 01/22/2015 FINAL  Final  Blood culture (routine x 2)     Status: None (Preliminary result)   Collection Time: 01/25/15  4:08 PM  Result Value Ref Range Status   Specimen Description BLOOD RIGHT FOREARM  Final   Special Requests BOTTLES DRAWN AEROBIC AND ANAEROBIC 5CC  Final   Culture NO GROWTH 2 DAYS  Final   Report Status PENDING  Incomplete  Blood culture (routine x 2)     Status: None (Preliminary result)   Collection Time: 01/25/15  4:23 PM  Result Value Ref Range Status   Specimen Description BLOOD LEFT HAND  Final  Special Requests BOTTLES DRAWN AEROBIC AND ANAEROBIC 5CC  Final   Culture NO GROWTH 2 DAYS  Final   Report Status PENDING  Incomplete  Body fluid culture     Status: None (Preliminary result)   Collection Time: 01/25/15  9:27 PM  Result Value Ref Range Status   Specimen Description FLUID PERITONEAL  Final   Special Requests Normal  Final   Gram Stain   Final    FEW WBC PRESENT,BOTH PMN AND MONONUCLEAR NO ORGANISMS SEEN    Culture NO GROWTH 3 DAYS  Final   Report Status PENDING  Incomplete  Urine culture     Status: None   Collection Time: 01/25/15 11:00 PM  Result Value Ref Range Status   Specimen Description URINE, CATHETERIZED  Final   Special Requests NONE  Final   Culture NO GROWTH 1 DAY  Final   Report Status 01/27/2015 FINAL  Final  MRSA PCR Screening     Status: Abnormal   Collection Time: 01/25/15 11:00 PM  Result Value Ref Range Status   MRSA by PCR POSITIVE (A) NEGATIVE Final    Comment:        The GeneXpert MRSA Assay (FDA approved for NASAL specimens only), is one component of a comprehensive MRSA colonization surveillance program. It is not intended to diagnose MRSA infection nor to guide or monitor treatment for MRSA infections. RESULT CALLED TO, READ BACK BY AND VERIFIED WITH: RN ANN Charlesetta Garibaldi [id number] @[phone]  THANEY      Studies: US Paracentesis  01/27/2015   CLINICAL DATA:   Ascites  EXAM: ULTRASOUND GUIDED PARACENTESIS  TECHNIQUE: Survey ultrasound of the abdomen was performed and an appropriate skin entry site in the right lower abdomen was selected. Skin site was marked, prepped with Betadine, and draped in usual sterile fashion, and infiltrated locally with 1% lidocaine. A 5 French multisidehole Yueh sheath needle was advanced into the peritoneal space until fluid could be aspirated. The sheath was advanced and the needle removed. Four L of clearascites were aspirated.  COMPLICATIONS: None  IMPRESSION: Technically successful ultrasound guided paracentesis, removing 4 L of ascites.   Electronically Signed   By: Marybelle Killings M.D.   On: 01/27/2015 11:18    Scheduled Meds: . albumin human  100 g Intravenous Daily  . amitriptyline  25 mg Oral QHS  . aspirin EC  81 mg Oral Daily  . cefTRIAXone (ROCEPHIN)  IV  1 g Intravenous Q24H  . Chlorhexidine Gluconate Cloth  6 each Topical Q0600  . clonazePAM  0.25 mg Oral BID  . collagenase   Topical Daily  . feeding supplement  1 Container Oral TID BM  . feeding supplement (PRO-STAT SUGAR FREE 64)  30 mL Oral BID  . folic acid  1 mg Oral Daily  . insulin aspart  0-9 Units Subcutaneous TID WC  . lactulose  30 g Oral TID  . magnesium oxide  400 mg Oral BID  . midodrine  7.5 mg Oral TID WC  . mupirocin ointment  1 application Nasal BID  . octreotide  100 mcg Subcutaneous TID  . oxyCODONE  10 mg Oral TID  . pantoprazole  40 mg Oral Daily  . rifaximin  550 mg Oral BID  . sodium chloride  500 mL Intravenous Once  . thiamine  100 mg Oral Daily   Continuous Infusions:   Active Problems:   Alcohol abuse   HTN (hypertension)   Thrombocytopenia   Bipolar affect, depressed   GI bleed  Depression, major, recurrent, moderate   MDD (major depressive disorder)   Alcoholic cirrhosis of liver with ascites   Chronic diastolic heart failure   Protein-calorie malnutrition, severe   Sepsis   Anasarca   ALC (alcoholic liver  cirrhosis)   Palliative care encounter    Time spent: 30 min    Cher Egnor, [facility] Pager [phone]. If 7PM-7AM, please contact night-coverage at [email or web address], password [facility] 01/28/2015, 12:54 PM  LOS: 3 days

## 2015-01-28 NOTE — Progress Notes (Signed)
Spoke w paliative care np. Pt was at [facility] prior to adm. sw ref made

## 2015-01-28 NOTE — Consult Note (Signed)
[facility] Renal Consultation Note  Requesting MD: Short Indication for Consultation: CKD with some progression in the setting of cirrhosis  HPI:  [name] is a 59 y.o. male with PMhx significant for alcoholic cirrhosis of liver, chronic pain and possible psych disorder with also 9 hospital admissions in the last 6 mos mostly due to encephalopathy and falls with one for GIB.  He is noted in the chart to be noncompliant with his medications.  Regarding renal function , there had been small bumps in creatinine along the way but none over 2.  Creatinine was 0.95 on June 23rd.  In July creatinine got over 2- peaked at 2.4 but came down to 1.48 on July 28th.  This most recent hosp started with creatinine of 2.03- improved to 1.53 on 9/1- but has trended worse since then- at 2.72 today- UOP marginal at 600-700 per day.  He did have 4 liters removed with paracentesis yesterday.  The hospitalist was concerned about possible hepatorenal syndrome- started him on albumin/octretide and midodrine just today. Blood pressures were in the 70's to 80's on 9/5 but are better now.  He is not on any diuretics.  Patient is asleep- difficult to fully arouse- says he is not swollen.   CREAT  Date/Time Value Ref Range Status  04/16/2012 08:15 PM 0.62 0.50 - 1.35 mg/dL Final   CREATININE, SER  Date/Time Value Ref Range Status  01/28/2015 02:46 AM 2.72* 0.61 - 1.24 mg/dL Final  01/27/2015 02:18 AM 2.49* 0.61 - 1.24 mg/dL Final  01/26/2015 08:20 AM 2.18* 0.61 - 1.24 mg/dL Final  01/25/2015 04:07 PM 2.07* 0.61 - 1.24 mg/dL Final  01/22/2015 07:19 AM 1.53* 0.61 - 1.24 mg/dL Final  01/21/2015 06:05 AM 1.64* 0.61 - 1.24 mg/dL Final  01/20/2015 03:30 AM 1.76* 0.61 - 1.24 mg/dL Final  01/19/2015 04:04 AM 1.90* 0.61 - 1.24 mg/dL Final  01/18/2015 03:50 AM 2.03* 0.61 - 1.24 mg/dL Final  01/17/2015 11:35 AM 1.92* 0.61 - 1.24 mg/dL Final  01/14/2015 10:58 PM 2.11* 0.61 - 1.24 mg/dL Final  12/18/2014 01:33  AM 1.48* 0.61 - 1.24 mg/dL Final  12/17/2014 02:35 AM 1.72* 0.61 - 1.24 mg/dL Final  12/16/2014 05:21 AM 2.19* 0.61 - 1.24 mg/dL Final  12/15/2014 11:42 PM 2.29* 0.61 - 1.24 mg/dL Final  12/15/2014 05:10 AM 2.41* 0.61 - 1.24 mg/dL Final  12/14/2014 04:00 PM 2.13* 0.61 - 1.24 mg/dL Final  12/14/2014 09:16 AM 2.20* 0.61 - 1.24 mg/dL Final  12/14/2014 09:09 AM 2.16* 0.61 - 1.24 mg/dL Final  12/09/2014 11:29 AM 1.54* 0.61 - 1.24 mg/dL Final  12/04/2014 05:57 AM 1.64* 0.61 - 1.24 mg/dL Final  12/03/2014 05:18 AM 1.47* 0.61 - 1.24 mg/dL Final  12/02/2014 05:00 AM 1.38* 0.61 - 1.24 mg/dL Final  12/01/2014 03:52 AM 1.42* 0.61 - 1.24 mg/dL Final  11/30/2014 04:19 AM 1.48* 0.61 - 1.24 mg/dL Final  11/29/2014 11:45 AM 1.40* 0.61 - 1.24 mg/dL Final  11/13/2014 06:00 AM 0.95 0.61 - 1.24 mg/dL Final  11/12/2014 03:07 AM 1.10 0.61 - 1.24 mg/dL Final  11/11/2014 03:22 AM 1.07 0.61 - 1.24 mg/dL Final  11/10/2014 12:30 PM 1.12 0.61 - 1.24 mg/dL Final  10/31/2014 07:52 AM 1.27* 0.61 - 1.24 mg/dL Final  10/06/2014 03:30 AM 0.94 0.61 - 1.24 mg/dL Final  10/04/2014 04:14 AM 1.20 0.61 - 1.24 mg/dL Final  10/03/2014 04:38 AM 1.34* 0.61 - 1.24 mg/dL Final  10/02/2014 05:49 AM 1.36* 0.61 - 1.24 mg/dL Final  10/01/2014 09:00 AM 1.28*  0.61 - 1.24 mg/dL Final  09/30/2014 05:46 AM 1.33* 0.61 - 1.24 mg/dL Final  09/29/2014 05:51 AM 1.40* 0.61 - 1.24 mg/dL Final  09/28/2014 05:20 AM 1.57* 0.61 - 1.24 mg/dL Final  09/27/2014 05:28 AM 1.77* 0.61 - 1.24 mg/dL Final  09/26/2014 04:18 PM 1.94* 0.61 - 1.24 mg/dL Final  09/03/2014 04:00 AM 0.86 0.50 - 1.35 mg/dL Final  09/02/2014 04:56 AM 0.94 0.50 - 1.35 mg/dL Final  09/01/2014 05:02 AM 0.95 0.50 - 1.35 mg/dL Final  08/31/2014 06:22 AM 1.23 0.50 - 1.35 mg/dL Final  08/30/2014 02:53 AM 1.32 0.50 - 1.35 mg/dL Final  08/29/2014 01:49 PM 1.38* 0.50 - 1.35 mg/dL Final  08/29/2014 11:26 AM 1.65* 0.50 - 1.35 mg/dL Final  08/18/2014 06:31 AM 0.98 0.50 - 1.35 mg/dL Final   08/17/2014 03:03 AM 0.92 0.50 - 1.35 mg/dL Final  08/16/2014 02:23 AM 1.17 0.50 - 1.35 mg/dL Final  08/15/2014 03:30 AM 1.23 0.50 - 1.35 mg/dL Final     PMHx:   Past Medical History  Diagnosis Date  . Neuropathy   . Diabetes mellitus   . Bipolar affect, depressed   . Hypertension   . Arthritis   . Stroke     Mini stroke about 55yr ago  . Cirrhosis   . Alcohol abuse   . Chronic pain   . Cocaine abuse   . Muscle spasm     both legs  . Encephalopathy, hepatic   . Detached retina   . COPD (chronic obstructive pulmonary disease)     emphysema  . Bronchitis   . Barrett's esophagus   . GERD (gastroesophageal reflux disease)     has ulcer  . Anemia     Past Surgical History  Procedure Laterality Date  . Fracture surgery      Leg and arm 138yrago  . Esophagogastroduodenoscopy  04/04/2012    Procedure: ESOPHAGOGASTRODUODENOSCOPY (EGD);  Surgeon: JoIrene ShipperMD;  [facility];  Service: Endoscopy;  Laterality: N/A;  . Esophagogastroduodenoscopy Left 03/13/2013    Procedure: ESOPHAGOGASTRODUODENOSCOPY (EGD);  Surgeon: WiArta SilenceMD;  [facility];  Service: Endoscopy;  Laterality: Left;  . Marland Kitchenye surgery  8 months ago both eyes    cataracts both eyes, detached eye, gas pocket  . Vasectomy    . Pars plana vitrectomy Left 07/08/2013    Procedure: PARS PLANA VITRECTOMY WITH 25 GAUGE;  Surgeon: GaHurman HornMD;  [facility];  Laterality: Left;  . Intraocular lens removal Left 07/08/2013    Procedure: REMOVAL OF INTRAOCULAR LENS;  Surgeon: GaHurman HornMD;  [facility];  Laterality: Left;  . Placement and suture of secondary intraocular lens Left 07/08/2013    Procedure: PLACEMENT AND SUTURE OF SECONDARY INTRAOCULAR LENS;  Surgeon: GaHurman HornMD;  [facility]: [facility];  Laterality: Left;  Insertion of Anterior Capsule Intraocular Lens   . Esophagogastroduodenoscopy N/A  01/16/2014    Procedure: ESOPHAGOGASTRODUODENOSCOPY (EGD);  Surgeon: JaWinfield Cunas MD;  [facility];  Service: Endoscopy;  Laterality: N/A;  . Colonoscopy N/A 01/17/2014    Procedure: COLONOSCOPY;  Surgeon: JaWinfield Cunas MD;  [facility];  Service: Endoscopy;  Laterality: N/A;  possible banding  . Esophagogastroduodenoscopy N/A 08/30/2014    Procedure: ESOPHAGOGASTRODUODENOSCOPY (EGD);  Surgeon: ViWilford CornerMD;  [facility];  Service: Endoscopy;  Laterality: N/A;  bedside  . Esophagogastroduodenoscopy N/A 11/30/2014    Procedure: ESOPHAGOGASTRODUODENOSCOPY (EGD);  Surgeon: Wilford Corner, MD;  Location: [facility] END[facility]COPY;  Service: Endoscopy;  Laterality: N/A;  . Esophagogastroduodenoscopy N/A 12/15/2014    Procedure: ESOPHAGOGASTRODUODENOSCOPY (EGD);  Surgeon: Teena Irani, MD;  Location: [facility]COPY;  Service: Endoscopy;  Laterality: N/A;    Family Hx:  Family History  Problem Relation Age of Onset  . Hypotension Mother     Social History:  reports that he has been smoking Cigarettes.  He has a 30 pack-year smoking history. He has never used smokeless tobacco. He reports that he drinks alcohol. He reports that he uses illicit drugs (Cocaine).  Allergies: No Known Allergies  Medications: Prior to Admission medications   Medication Sig Start Date End Date Taking? Authorizing Provider  albuterol (PROVENTIL) (2.5 MG/3ML) 0.083% nebulizer solution Take 3 mLs (2.5 mg total) by nebulization every 4 (four) hours as needed for wheezing or shortness of breath. 01/23/15  Yes Robbie Lis, MD  Amino Acids-Protein Hydrolys (FEEDING SUPPLEMENT, PRO-STAT SUGAR FREE 64,) LIQD Take 30 mLs by mouth 2 (two) times daily. 01/23/15  Yes Robbie Lis, MD  amitriptyline (ELAVIL) 25 MG tablet Take 1 tablet (25 mg total) by mouth at bedtime. 10/05/14  Yes Nita Sells, MD  CALCIUM-VITAMIN D PO Take 1 tablet by mouth daily.    Yes Historical Provider, MD  clonazePAM  (KLONOPIN) 0.25 MG disintegrating tablet Take 1 tablet (0.25 mg total) by mouth 2 (two) times daily. 12/20/14  Yes Thurnell Lose, MD  collagenase (SANTYL) ointment Apply topically daily. 01/23/15  Yes Robbie Lis, MD  diclofenac sodium (VOLTAREN) 1 % GEL Apply 4 g topically as needed (for back of leg pain).   Yes Historical Provider, MD  feeding supplement (BOOST / RESOURCE BREEZE) LIQD Take 1 Container by mouth 3 (three) times daily between meals. 12/20/14  Yes Thurnell Lose, MD  folic acid (FOLVITE) 1 MG tablet Take 1 tablet (1 mg total) by mouth daily. 10/05/14  Yes Nita Sells, MD  lactulose (CHRONULAC) 10 GM/15ML solution Take 45 mLs (30 g total) by mouth 3 (three) times daily. 12/03/14  Yes Annita Brod, MD  magnesium oxide (MAG-OX) 400 (241.3 MG) MG tablet Take 1 tablet (400 mg total) by mouth 2 (two) times daily. 09/03/14  Yes Reyne Dumas, MD  methocarbamol (ROBAXIN) 500 MG tablet Take 2 tablets (1,000 mg total) by mouth every 8 (eight) hours as needed. Patient taking differently: Take 1,000 mg by mouth every 8 (eight) hours as needed for muscle spasms.  01/15/15  Yes Julianne Rice, MD  Multiple Vitamin (MULTIVITAMIN WITH MINERALS) TABS tablet Take 1 tablet by mouth daily. 07/31/14  Yes Jessica U Vann, DO  NOVOLOG FLEXPEN 100 UNIT/ML FlexPen Inject 6 Units into the skin 3 (three) times daily with meals. 11/24/14  Yes Historical Provider, MD  Oxycodone HCl 10 MG TABS Take 1 tablet (10 mg total) by mouth 3 (three) times daily. 01/15/15  Yes Julianne Rice, MD  pantoprazole (PROTONIX) 40 MG tablet Take 1 tablet (40 mg total) by mouth daily. 01/23/15  Yes Robbie Lis, MD  propranolol (INDERAL) 20 MG tablet Take 20 mg by mouth 2 (two) times daily. 09/24/14  Yes Historical Provider, MD  rifaximin (XIFAXAN) 550 MG TABS tablet Take 1 tablet (550 mg total) by mouth 2 (two) times daily. 01/23/15  Yes Robbie Lis, MD  spironolactone (ALDACTONE) 50 MG tablet Take 1 tablet (50 mg total) by mouth  daily. 12/03/14  Yes Annita Brod, MD  thiamine (VITAMIN B-1) 100 MG tablet  Take 100 mg by mouth daily.   Yes Historical Provider, MD    I have reviewed the patient's current medications.  Labs:  Results for orders placed or performed during the hospital encounter of 01/25/15 (from the past 48 hour(s))  Glucose, capillary     Status: None   Collection Time: 01/26/15  4:10 PM  Result Value Ref Range   Glucose-Capillary 82 65 - 99 mg/dL   Comment 1 Capillary Specimen   Glucose, capillary     Status: Abnormal   Collection Time: 01/26/15 10:38 PM  Result Value Ref Range   Glucose-Capillary 113 (H) 65 - 99 mg/dL   Comment 1 Capillary Specimen   Comprehensive metabolic panel     Status: Abnormal   Collection Time: 01/27/15  2:18 AM  Result Value Ref Range   Sodium 131 (L) 135 - 145 mmol/L   Potassium 4.0 3.5 - 5.1 mmol/L   Chloride 104 101 - 111 mmol/L   CO2 20 (L) 22 - 32 mmol/L   Glucose, Bld 127 (H) 65 - 99 mg/dL   BUN 59 (H) 6 - 20 mg/dL   Creatinine, Ser 2.49 (H) 0.61 - 1.24 mg/dL   Calcium 8.1 (L) 8.9 - 10.3 mg/dL   Total Protein 4.8 (L) 6.5 - 8.1 g/dL   Albumin 2.5 (L) 3.5 - 5.0 g/dL   AST 76 (H) 15 - 41 U/L   ALT 41 17 - 63 U/L   Alkaline Phosphatase 121 38 - 126 U/L   Total Bilirubin 2.4 (H) 0.3 - 1.2 mg/dL   GFR calc non Af Amer 27 (L) >60 mL/min   GFR calc Af Amer 31 (L) >60 mL/min    Comment: (NOTE) The eGFR has been calculated using the CKD EPI equation. This calculation has not been validated in all clinical situations. eGFR's persistently <60 mL/min signify possible Chronic Kidney Disease.    Anion gap 7 5 - 15  Magnesium     Status: None   Collection Time: 01/27/15  2:18 AM  Result Value Ref Range   Magnesium 1.7 1.7 - 2.4 mg/dL  Protime-INR     Status: Abnormal   Collection Time: 01/27/15  2:18 AM  Result Value Ref Range   Prothrombin Time 21.0 (H) 11.6 - 15.2 seconds   INR 1.82 (H) 0.00 - 1.49  Glucose, capillary     Status: Abnormal   Collection  Time: 01/27/15  7:43 AM  Result Value Ref Range   Glucose-Capillary 120 (H) 65 - 99 mg/dL   Comment 1 Capillary Specimen   Glucose, capillary     Status: Abnormal   Collection Time: 01/27/15 11:31 AM  Result Value Ref Range   Glucose-Capillary 166 (H) 65 - 99 mg/dL   Comment 1 Capillary Specimen   Glucose, capillary     Status: Abnormal   Collection Time: 01/27/15  4:13 PM  Result Value Ref Range   Glucose-Capillary 137 (H) 65 - 99 mg/dL   Comment 1 Capillary Specimen   Glucose, capillary     Status: Abnormal   Collection Time: 01/27/15  9:25 PM  Result Value Ref Range   Glucose-Capillary 143 (H) 65 - 99 mg/dL   Comment 1 Capillary Specimen   Comprehensive metabolic panel     Status: Abnormal   Collection Time: 01/28/15  2:46 AM  Result Value Ref Range   Sodium 129 (L) 135 - 145 mmol/L   Potassium 3.9 3.5 - 5.1 mmol/L   Chloride 100 (L) 101 - 111 mmol/L  CO2 19 (L) 22 - 32 mmol/L   Glucose, Bld 149 (H) 65 - 99 mg/dL   BUN 62 (H) 6 - 20 mg/dL   Creatinine, Ser 2.72 (H) 0.61 - 1.24 mg/dL   Calcium 7.9 (L) 8.9 - 10.3 mg/dL   Total Protein 4.8 (L) 6.5 - 8.1 g/dL   Albumin 2.3 (L) 3.5 - 5.0 g/dL   AST 74 (H) 15 - 41 U/L   ALT 42 17 - 63 U/L   Alkaline Phosphatase 125 38 - 126 U/L   Total Bilirubin 1.6 (H) 0.3 - 1.2 mg/dL   GFR calc non Af Amer 24 (L) >60 mL/min   GFR calc Af Amer 28 (L) >60 mL/min    Comment: (NOTE) The eGFR has been calculated using the CKD EPI equation. This calculation has not been validated in all clinical situations. eGFR's persistently <60 mL/min signify possible Chronic Kidney Disease.    Anion gap 10 5 - 15  Magnesium     Status: None   Collection Time: 01/28/15  2:46 AM  Result Value Ref Range   Magnesium 1.7 1.7 - 2.4 mg/dL  Protime-INR     Status: Abnormal   Collection Time: 01/28/15  2:46 AM  Result Value Ref Range   Prothrombin Time 21.6 (H) 11.6 - 15.2 seconds   INR 1.88 (H) 0.00 - 1.49  Glucose, capillary     Status: Abnormal    Collection Time: 01/28/15  9:10 AM  Result Value Ref Range   Glucose-Capillary 171 (H) 65 - 99 mg/dL   Comment 1 Notify RN   Glucose, capillary     Status: Abnormal   Collection Time: 01/28/15 11:58 AM  Result Value Ref Range   Glucose-Capillary 193 (H) 65 - 99 mg/dL   Comment 1 Notify RN      ROS:  Review of systems not obtained due to patient factors. patient is very sleepy  Physical Exam: Filed Vitals:   01/28/15 1156  BP: 106/57  Pulse:   Temp: 99.1 F (37.3 C)  Resp:      General: sleeping- jaundiced HEENT: PERRLA, EOMI, mucous membranes moist Neck: positive for JVD Heart: tachy Lungs: decreased BS at bases Abdomen: distended  Extremities: 3 plus pitting edema Skin: warm and dry Neuro: somnolent- arousable but it does not last   Assessment/Plan: 59 year old WM with cirrhosis and noncompliance with really failure to thrive.  He has now developed worsening renal function and decreased UOP  1.Renal- worsening renal function over the last few days in the setting of yet another hosp for encephalopathy- he was very hypotensive early in hosp so could have ATN but also hepatorenal syndrome is in the differential.  I think is reasonable to try therapy for hepatorenal in the form of octreotide/midodrine and some albumin but would not give levophed.  We are in a situation that this will either get better or it wont.  I would not consider him a candidate for dialysis.  Continue with supportive medical care.  2. Hypertension/volume  - massively volume overloaded on no diuretics, I will start some.   Also hopefully if renal function improves he will mobilize fluid on his own.  Is third spacing due to hypoalbuminemia and anemia 3. Hyponatremia- due to volume - will give diuretics 4. Anemia  - due to CKD, blood draws and history of GIB.  Supportive care  5.  Dispo- unfortunately  I think would be a candidate for hospice in either scenario but especially if his renal  function does not  improve.    Latria Mccarron A 01/28/2015, 1:36 PM

## 2015-01-28 NOTE — Progress Notes (Signed)
Daily Progress Note   Patient Name: [name]       Date: 01/28/2015 DOB: 1955-10-08  Age: 59 y.o. MRN#: [id number] Attending Physician: Janece Canterbury, MD Primary Care Physician: Barbette Merino, MD Admit Date: 01/25/2015  Reason for Consultation/Follow-up: Establishing goals of care and Psychosocial/spiritual support  Subjective: [name] is lying in bed trying to eat his breakfast this morning. He appears to be very weak.   We talk about his current illness, multiple readmits and home safety.  He states his daughter is coming to stay with him.  We discuss that she lives in [place] and has 4 children and this would be difficult for her.  He talks about his [place] Gentile, Constance Holster, multiple times and seems to focus on her taking his money, ($1100) and that he hasn't seen her in 3 days.  He states " I guess we are all done", when I ask if she will be able to care for him.  We discuss SNF and [name] states he has been in "[place]" in the past and would agree to return there.    We talk about his audible wheeze and breathing problems and that he may need a paracentesis.  I ask whether he would want to have this done again.  He states " I've had it so many times before", but when I ask him if that means he is used to it or that he doesn't want to do it again, he does not answer.  I ask him if he knows that he has a right to not have this done to him again, and he states "Yeah".    I ask [name] where he sees himself in 1 month, he does not answer.  We talk about a safe discharge plan.  He becomes rearful multiple times during our discussion.  I ask if he has ever considered what Hospice can do for him, and he states no, but asks about services.  I share with him that Hospice focuses on pain relief and dignity.  He again talks about Constance Holster and her having his bank card.  He also states about daughter, [name], "all she wants is my money".    I ask [name] where he sees himself in 1 week, and again he  does not answer.  I encourage [name] to think about what he wants this time to look llke and that he does have choices.   Length of Stay: 3 days  Current Medications: Scheduled Meds:  . albumin human  100 g Intravenous Daily  . amitriptyline  25 mg Oral QHS  . aspirin EC  81 mg Oral Daily  . cefTRIAXone (ROCEPHIN)  IV  1 g Intravenous Q24H  . Chlorhexidine Gluconate Cloth  6 each Topical Q0600  . clonazePAM  0.25 mg Oral BID  . collagenase   Topical Daily  . feeding supplement  1 Container Oral TID BM  . feeding supplement (PRO-STAT SUGAR FREE 64)  30 mL Oral BID  . folic acid  1 mg Oral Daily  . insulin aspart  0-9 Units Subcutaneous TID WC  . lactulose  30 g Oral TID  . magnesium oxide  400 mg Oral BID  . midodrine  7.5 mg Oral TID WC  . mupirocin ointment  1 application Nasal BID  . octreotide  100 mcg Subcutaneous TID  . oxyCODONE  10 mg Oral TID  . pantoprazole  40 mg Oral Daily  . rifaximin  550 mg Oral BID  .  sodium chloride  500 mL Intravenous Once  . thiamine  100 mg Oral Daily    Continuous Infusions:    PRN Meds: sodium chloride, albuterol, diclofenac sodium, methocarbamol, ondansetron  Palliative Performance Scale: 30% at best.      Vital Signs: BP 136/88 mmHg  Pulse 75  Temp(Src) 99.2 F (37.3 C) (Oral)  Resp 18  Ht 5\' 11"  (1.803 m)  Wt 111.8 kg (246 lb 7.6 oz)  BMI 34.39 kg/m2  SpO2 100% SpO2: SpO2: 100 % O2 Device: O2 Device: Not Delivered O2 Flow Rate:    Intake/output summary:  Intake/Output Summary (Last 24 hours) at 01/28/15 1005 Last data filed at 01/28/15 0800  Gross per 24 hour  Intake    650 ml  Output    960 ml  Net   -310 ml   LBM:   Baseline Weight: Weight: 114.3 kg (251 lb 15.8 oz) Most recent weight: Weight: 111.8 kg (246 lb 7.6 oz)  Physical Exam: Constitutional:  Frail, thin, weak, lying in bed makes but does not keep eye contact.  Resp:  Audible wheeze without stethoscope, even, rate around 16 Cardio: NSR, 80's.    GI: abd firm, distended, tender Psych: tearful multiple times during visit. Often closes eyes.  Musc: thin, unable to lift food to his mouth easily.               Additional Data Reviewed: Recent Labs     01/25/15  1607  01/26/15  0008   01/27/15  0218  01/28/15  0246  WBC  5.7  5.7   --    --    --   HGB  8.3*  8.6*   --    --    --   PLT  51*  49*   --    --    --   NA  129*   --    < >  131*  129*  BUN  53*   --    < >  59*  62*  CREATININE  2.07*   --    < >  2.49*  2.72*   < > = values in this interval not displayed.     Problem List:  Patient Active Problem List   Diagnosis Date Noted  . ALC (alcoholic liver cirrhosis) 01/25/2015  . Acute urinary retention   . Moderate protein-calorie malnutrition   . Other emphysema   . Generalized weakness   . Goals of care, counseling/discussion   . Other cirrhosis of liver   . Severe muscle deconditioning   . Weakness generalized   . Encounter for palliative care   . Anasarca   . Fall 01/17/2015  . Sepsis 01/17/2015  . Cellulitis 01/17/2015  . Lactic acidosis 01/17/2015  . Protein-calorie malnutrition, severe 12/19/2014  . Pressure ulcer 12/15/2014  . Acute respiratory failure 12/14/2014  . Acute renal failure 12/14/2014  . Chronic diastolic heart failure 86/76/1950  . Ascites   . Upper GI bleed   . Medically noncompliant 09/26/2014  . UTI (lower urinary tract infection)   . Acute blood loss anemia   . Alcoholic cirrhosis of liver without ascites   . Acute combined systolic and diastolic congestive heart failure   . AKI (acute kidney injury)   . Respiratory failure   . Unresponsive   . Hyperkalemia 07/28/2014  . Encephalopathy 07/28/2014  . Suicidal ideations   . Hx of bipolar disorder   . Alcoholic cirrhosis of liver with ascites   .  PN (peripheral neuropathy)   . Retinal detachment   . MDD (major depressive disorder) 02/14/2014  . Depression, major, recurrent, moderate 02/05/2014  . Anemia 01/27/2014  . GI  bleed 01/14/2014  . Altered mental status 09/13/2013  . Hypoglycemic encephalopathy 09/13/2013  . Metabolic acidosis 15/09/6977  . Acute kidney injury 09/13/2013  . Acute encephalopathy 08/28/2013  . Encephalopathy, hepatic 08/28/2013  . Pancytopenia 04/11/2013  . Falls 04/11/2013  . Hepatic encephalopathy 04/01/2013  . Tobacco abuse 03/11/2013  . Cellulitis of lower leg 12/13/2012  . Bipolar affect, depressed 12/13/2012  . Serum ammonia increased 06/20/2012  . Cirrhosis   . Thrombocytopenia 04/27/2012  . Weakness 04/27/2012  . Depression 04/27/2012  . Ulcer, stomach peptic 04/27/2012  . Acute GI bleeding 04/04/2012  . HTN (hypertension) 04/04/2012  . Diabetes mellitus 04/04/2012  . Alcohol abuse 04/08/2011    Class: Acute     Palliative Care Assessment & Plan    Code Status:  DNR  Goals of Care:  Mr. Lamson agrees that he is unable to return to his home safely, and is willing to return to a SNF.  He is, at this time, UNSURE if he will accept Hospice services.    We discuss the concept of choosing his own path and that Hospice will focus on pain relief and dignity.   Symptom Management:  Oxycodone 10 mg PO TID  Voltaren 1% gel to legs for pain.   Zofran 4 mg Q 8 hours PRN  Sandostatin 100 mcg SQ TID  Palliative Prophylaxis:  Taking lactulose 30 gm TID  Psycho-social/Spiritual:  Desire for further Chaplaincy support:  Not at this time, "dont feel good".    Prognosis: Possible hospital death,  likely less than 2-3 months.  Discharge Planning: [place] for rehab with Palliative care service follow-up, considering Hospice to follow in SNF.  (He has not said No.   Care plan was discussed with nursing staff, CM, Dr. Sheran Fava.   Call to [facility] per Mr. Aldea request, left VM. Call to daughter, "person you are calling cannot accept calls at this time".    Thank you for allowing the Palliative Medicine Team to assist in the care of this  patient.   Time In: 0930 Time Out: 1030 Total Time 60 minutes Prolonged Time Billed  no     Greater than 50%  of this time was spent counseling and coordinating care related to the above assessment and plan.   Drue Novel, NP  01/28/2015, 10:05 AM  Please contact Palliative Medicine Team phone at [phone] for questions and concerns.

## 2015-01-28 NOTE — Care Management Important Message (Signed)
Important Message  Patient Details  Name: [name] MRN: [id number] Date of Birth: [birth year]   Medicare Important Message Given:  Yes-second notification given    Lacretia Leigh, RN 01/28/2015, 9:52 AM

## 2015-01-29 DIAGNOSIS — F314 Bipolar disorder, current episode depressed, severe, without psychotic features: Secondary | ICD-10-CM

## 2015-01-29 DIAGNOSIS — F3130 Bipolar disorder, current episode depressed, mild or moderate severity, unspecified: Secondary | ICD-10-CM

## 2015-01-29 LAB — MAGNESIUM: Magnesium: 1.8 mg/dL (ref 1.7–2.4)

## 2015-01-29 LAB — BODY FLUID CULTURE
Culture: NO GROWTH
Special Requests: NORMAL

## 2015-01-29 LAB — RENAL FUNCTION PANEL
Albumin: 2.8 g/dL — ABNORMAL LOW (ref 3.5–5.0)
Anion gap: 9 (ref 5–15)
BUN: 66 mg/dL — AB (ref 6–20)
CHLORIDE: 101 mmol/L (ref 101–111)
CO2: 18 mmol/L — AB (ref 22–32)
Calcium: 7.9 mg/dL — ABNORMAL LOW (ref 8.9–10.3)
Creatinine, Ser: 2.86 mg/dL — ABNORMAL HIGH (ref 0.61–1.24)
GFR calc Af Amer: 26 mL/min — ABNORMAL LOW (ref >60)
GFR, EST NON AFRICAN AMERICAN: 23 mL/min — AB (ref >60)
Glucose, Bld: 135 mg/dL — ABNORMAL HIGH (ref 65–99)
POTASSIUM: 4 mmol/L (ref 3.5–5.1)
Phosphorus: 4 mg/dL (ref 2.5–4.6)
Sodium: 128 mmol/L — ABNORMAL LOW (ref 135–145)

## 2015-01-29 LAB — GLUCOSE, CAPILLARY
GLUCOSE-CAPILLARY: 160 mg/dL — AB (ref 65–99)
Glucose-Capillary: 127 mg/dL — ABNORMAL HIGH (ref 65–99)
Glucose-Capillary: 133 mg/dL — ABNORMAL HIGH (ref 65–99)
Glucose-Capillary: 136 mg/dL — ABNORMAL HIGH (ref 65–99)

## 2015-01-29 LAB — PROTIME-INR
INR: 2.2 — ABNORMAL HIGH (ref 0.00–1.49)
Prothrombin Time: 24.3 s — ABNORMAL HIGH (ref 11.6–15.2)

## 2015-01-29 LAB — FOLATE: Folate: 57.6 ng/mL

## 2015-01-29 LAB — CBC
HEMATOCRIT: 18.8 % — AB (ref 39.0–52.0)
Hemoglobin: 6.2 g/dL — CL (ref 13.0–17.0)
MCH: 30.7 pg (ref 26.0–34.0)
MCHC: 33 g/dL (ref 30.0–36.0)
MCV: 93.1 fL (ref 78.0–100.0)
Platelets: 47 10*3/uL — ABNORMAL LOW (ref 150–400)
RBC: 2.02 MIL/uL — AB (ref 4.22–5.81)
RDW: 21.7 % — AB (ref 11.5–15.5)
WBC: 5.2 10*3/uL (ref 4.0–10.5)

## 2015-01-29 LAB — PREPARE RBC (CROSSMATCH)

## 2015-01-29 LAB — IRON AND TIBC
IRON: 20 ug/dL — AB (ref 45–182)
SATURATION RATIOS: 13 % — AB (ref 17.9–39.5)
TIBC: 153 ug/dL — ABNORMAL LOW (ref 250–450)
UIBC: 133 ug/dL

## 2015-01-29 LAB — VITAMIN B12: Vitamin B-12: 1085 pg/mL — ABNORMAL HIGH (ref 180–914)

## 2015-01-29 LAB — FERRITIN: FERRITIN: 26 ng/mL (ref 24–336)

## 2015-01-29 MED ORDER — VENLAFAXINE HCL 25 MG PO TABS
25.0000 mg | ORAL_TABLET | Freq: Two times a day (BID) | ORAL | Status: DC
  Administered 2015-01-29 – 2015-02-02 (×7): 25 mg via ORAL
  Filled 2015-01-29 (×11): qty 1

## 2015-01-29 MED ORDER — SODIUM CHLORIDE 0.9 % IV SOLN
Freq: Once | INTRAVENOUS | Status: AC
  Administered 2015-01-29: 07:00:00 via INTRAVENOUS

## 2015-01-29 MED ORDER — MIDODRINE HCL 5 MG PO TABS
10.0000 mg | ORAL_TABLET | Freq: Three times a day (TID) | ORAL | Status: DC
  Administered 2015-01-29 – 2015-02-02 (×12): 10 mg via ORAL
  Filled 2015-01-29 (×12): qty 2

## 2015-01-29 MED ORDER — HYDROXYZINE HCL 25 MG PO TABS
50.0000 mg | ORAL_TABLET | Freq: Every day | ORAL | Status: DC
  Administered 2015-01-29 – 2015-02-01 (×4): 50 mg via ORAL
  Filled 2015-01-29 (×4): qty 2

## 2015-01-29 MED ORDER — FERROUS SULFATE 325 (65 FE) MG PO TABS
325.0000 mg | ORAL_TABLET | Freq: Two times a day (BID) | ORAL | Status: DC
  Administered 2015-01-29 – 2015-02-02 (×8): 325 mg via ORAL
  Filled 2015-01-29 (×8): qty 1

## 2015-01-29 MED ORDER — ARIPIPRAZOLE 2 MG PO TABS
2.0000 mg | ORAL_TABLET | Freq: Two times a day (BID) | ORAL | Status: DC
  Filled 2015-01-29 (×2): qty 1

## 2015-01-29 MED ORDER — SODIUM CHLORIDE 0.9 % IV SOLN: Freq: Once | INTRAVENOUS | Status: AC

## 2015-01-29 MED ORDER — FUROSEMIDE 10 MG/ML IJ SOLN
80.0000 mg | Freq: Three times a day (TID) | INTRAMUSCULAR | Status: DC
  Administered 2015-01-29 – 2015-02-02 (×13): 80 mg via INTRAVENOUS
  Filled 2015-01-29 (×13): qty 8

## 2015-01-29 MED ORDER — ALBUMIN HUMAN 25 % IV SOLN
100.0000 g | Freq: Once | INTRAVENOUS | Status: AC
  Administered 2015-01-29: 12.5 g via INTRAVENOUS
  Filled 2015-01-29: qty 400

## 2015-01-29 MED ORDER — SODIUM CHLORIDE 0.9 % IV SOLN: Freq: Once | INTRAVENOUS | Status: DC

## 2015-01-29 NOTE — Progress Notes (Addendum)
[facility] PROGRESS NOTE  [name] BTD:[id number] DOB: 1955/07/08 DOA: 01/25/2015 PCP: Barbette Merino, MD  Brief Summary  Patient is a 59 year old with history of alcoholic liver cirrhosis presenting with abdominal discomfort and SIRs.  He has had 9 admissions in the last 6 months and 5 ER visits.  Most admissions have been for hepatic encephalopathy and falls, however, in July he had GIB.  On several admissions, he required intubation for his encephalopathy.  He has been noncompliant with his medications, including diuretics.  He continues to require large volume paracenteses, sometimes every few days.   He continues to use cocaine and is on narcotics for chronic pain and clonazepam for "bipolar" all of which likely contribute to his encephalopathy.    Assessment/Plan  Alcoholic liver cirrhosis with portal hypertensive gastropathy, hx of GIB, recurrent hepatic encephalopathy, currently with confusion and slowed mentation consistent with hepatic encephalopathy.  MELD ([facility] and [facility]) 25.  All alcohol levels have been less than threshold for last two years.   -  continue rifaximin and lactulose -  Lactulose enema or oral lactulose, oral is preferable if he is willing to take it, however, he has frequently declined his dose -  Stop/minimize sedating medications -  Appreciate Palliative care assistance  AKI possibly due to ATN from third spacing and hypotension, but concerning for hepatorenal syndrome.  Uop 960 yesterday.   -  RUS:  Normal appearing kidneys with no hydronephrosis or signs of obstruction -  FENa 0.15% and UA with granular casts -  Continue albumin, octreotide, and midodrine day 2 -  Increase midodrine to 10mg  TID -  Appreciate Nephrology assistance  SIRS with rales and rhonchi on exam concerning for aspiration pneumonia -  Cell count not obtained on paracentesis, but culture is currently negative at 3 days, so I doubt this was the source -  Continue flagyl and ceftriaxone  for probably aspiration pneumonia -  Speech therapy consultation for swallow eval -  Blood cultures NGTD  Anasarca and hyponatremia due to cirrhosis -  s/p paracentesis -  Diuretics per nephrology  Hyperglycemia -  A1c pending -  Continue SSI pending A1c  Alcohol abuse - no alcohol associated anxiety noticed on exam today. -  All alcohol levels have been less than threshold for last two years.    Hypotension - Hypotension improving, but BP still soft -  Increasing midodrine as above  Protein-calorie malnutrition, severe  - currently on nutritional supplementation.  Nausea - provide antiemetics  Normocytic anemia likely due to recent blood loss and renal failure, hemoglobin trending down -  TSH recently wnl -  Transfuse 2 units PRBC -  Repeat iron studies c/w iron deficiency, start ferrous sulfate supplementation -  b12 wnl, folate wnl  Bipolar 1 d/o and constantly tearful, depressed -  Psychiatry consultation.  Please keep hyponatremia in mind when considering therapies  Diet:  Diabetic  Access:  PIV IVF:  albumin Proph:  SCDs  Code Status: DNR Family Communication: patient alone Disposition Plan: pending improvement in kidney function.  [facility] would be an excellent candidate for hospice, particularly if his kidney function improves.     Consultants:  IR  Palliative care  Nephrology  Procedures:  Paracentesis  Antibiotics:  Rocephin and Xifaxan  Flagyl added on 9/7  HPI/Subjective:  States that he has not had a BM.  Still feels uncomfortable from swelling although abdomen feels better.  Will not talk about his kidneys failing and becomes tearful and changes the subject.  Objective: Filed Vitals:   01/29/15 9622 01/29/15 0705 01/29/15 0707 01/29/15 0722  BP: 110/44   106/42  Pulse:  86 86   Temp: 99 F (37.2 C)  99 F (37.2 C) 98.8 F (37.1 C)  TempSrc: Oral  Oral Oral  Resp: 14 16  14   Height:      Weight:      SpO2:  98% 98%      Intake/Output Summary (Last 24 hours) at 01/29/15 0806 Last data filed at 01/29/15 0707  Gross per 24 hour  Intake   2890 ml  Output   1071 ml  Net   1819 ml   Filed Weights   01/27/15 0327 01/28/15 0428 01/29/15 0419  Weight: 115.3 kg (254 lb 3.1 oz) 111.8 kg (246 lb 7.6 oz) 118.7 kg (261 lb 11 oz)   Body mass index is 36.51 kg/(m^2).  Exam:   General:  Adult male, No acute distress, speech is a little faster today, but still not oriented  HEENT:  NCAT, MMM  Cardiovascular:  RRR, nl S1, S2 2/6 systolic murmur, 2+ pulses, warm extremities  Respiratory:  Bilateral rales with rhonchi, frequent cough, no increased WOB  Abdomen:   NABS, soft, moderately distended, firm but not tense, diffusely tender to palpation without rebound or guarding  MSK:   Normal tone and bulk, massively volume overloaded on his legs, 3+ pitting edema in dependent areas, scrotum massively swollen  Neuro:   Moves all extremities approximately equally  Psych:  A&O to person, place, but not date, month, or reason for hospitalization.  Tearful affect  Data Reviewed: Basic Metabolic Panel:  Recent Labs Lab 01/25/15 1607 01/26/15 0820 01/27/15 0218 01/28/15 0246 01/29/15 0245  NA 129* 129* 131* 129* 128*  K 3.7 3.6 4.0 3.9 4.0  CL 105 102 104 100* 101  CO2 18* 18* 20* 19* 18*  GLUCOSE 170* 108* 127* 149* 135*  BUN 53* 57* 59* 62* 66*  CREATININE 2.07* 2.18* 2.49* 2.72* 2.86*  CALCIUM 8.1* 7.8* 8.1* 7.9* 7.9*  MG  --  1.7 1.7 1.7 1.8  PHOS  --   --   --   --  4.0   Liver Function Tests:  Recent Labs Lab 01/25/15 1607 01/26/15 0820 01/27/15 0218 01/28/15 0246 01/29/15 0245  AST 105* 88* 76* 74*  --   ALT 54 46 41 42  --   ALKPHOS 148* 132* 121 125  --   BILITOT 2.4* 2.3* 2.4* 1.6*  --   PROT 5.1* 4.6* 4.8* 4.8*  --   ALBUMIN 2.1* 1.8* 2.5* 2.3* 2.8*    Recent Labs Lab 01/25/15 1607  LIPASE 30    Recent Labs Lab 01/25/15 1623  AMMONIA 89*   CBC:  Recent Labs Lab  01/25/15 1607 01/26/15 0008 01/29/15 0245  WBC 5.7 5.7 5.2  NEUTROABS 4.1 4.1  --   HGB 8.3* 8.6* 6.2*  HCT 25.1* 25.3* 18.8*  MCV 91.9 92.0 93.1  PLT 51* 49* 47*    Recent Results (from the past 240 hour(s))  Body fluid culture     Status: None   Collection Time: 01/19/15 11:06 AM  Result Value Ref Range Status   Specimen Description PERITONEAL  Final   Special Requests NONE  Final   Gram Stain   Final    FEW WBC PRESENT,BOTH PMN AND MONONUCLEAR NO ORGANISMS SEEN    Culture   Final    NO GROWTH 3 DAYS Performed at [facility]  Report Status 01/22/2015 FINAL  Final  Blood culture (routine x 2)     Status: None (Preliminary result)   Collection Time: 01/25/15  4:08 PM  Result Value Ref Range Status   Specimen Description BLOOD RIGHT FOREARM  Final   Special Requests BOTTLES DRAWN AEROBIC AND ANAEROBIC 5CC  Final   Culture NO GROWTH 3 DAYS  Final   Report Status PENDING  Incomplete  Blood culture (routine x 2)     Status: None (Preliminary result)   Collection Time: 01/25/15  4:23 PM  Result Value Ref Range Status   Specimen Description BLOOD LEFT HAND  Final   Special Requests BOTTLES DRAWN AEROBIC AND ANAEROBIC 5CC  Final   Culture NO GROWTH 3 DAYS  Final   Report Status PENDING  Incomplete  Body fluid culture     Status: None (Preliminary result)   Collection Time: 01/25/15  9:27 PM  Result Value Ref Range Status   Specimen Description FLUID PERITONEAL  Final   Special Requests Normal  Final   Gram Stain   Final    FEW WBC PRESENT,BOTH PMN AND MONONUCLEAR NO ORGANISMS SEEN    Culture NO GROWTH 3 DAYS  Final   Report Status PENDING  Incomplete  Urine culture     Status: None   Collection Time: 01/25/15 11:00 PM  Result Value Ref Range Status   Specimen Description URINE, CATHETERIZED  Final   Special Requests NONE  Final   Culture NO GROWTH 1 DAY  Final   Report Status 01/27/2015 FINAL  Final  MRSA PCR Screening     Status: Abnormal   Collection  Time: 01/25/15 11:00 PM  Result Value Ref Range Status   MRSA by PCR POSITIVE (A) NEGATIVE Final    Comment:        The GeneXpert MRSA Assay (FDA approved for NASAL specimens only), is one component of a comprehensive MRSA colonization surveillance program. It is not intended to diagnose MRSA infection nor to guide or monitor treatment for MRSA infections. RESULT CALLED TO, READ BACK BY AND VERIFIED WITH: RN ANN DILLARD [id number] @[phone]  THANEY      Studies: US Renal  01/28/2015   CLINICAL DATA:  Worsening renal function. Chronic renal disease. History of cirrhosis.  EXAM: RENAL / URINARY TRACT ULTRASOUND COMPLETE  COMPARISON:  None.  FINDINGS: Right Kidney:  Length: 12.1 cm. Echogenicity within normal limits. No mass or hydronephrosis visualized.  Left Kidney:  Length: 12.4 cm. Echogenicity within normal limits. No mass or hydronephrosis visualized.  Bladder:  Appears normal for degree of bladder distention. Foley catheter is in place. A small volume of pelvic ascites is identified.  IMPRESSION: Negative for hydronephrosis.  Normal appearing kidneys.  Small volume of pelvic ascites.   Electronically Signed   By: Inge Rise M.D.   On: 01/28/2015 19:50   US Paracentesis  01/27/2015   CLINICAL DATA:  Ascites  EXAM: ULTRASOUND GUIDED PARACENTESIS  TECHNIQUE: Survey ultrasound of the abdomen was performed and an appropriate skin entry site in the right lower abdomen was selected. Skin site was marked, prepped with Betadine, and draped in usual sterile fashion, and infiltrated locally with 1% lidocaine. A 5 French multisidehole Yueh sheath needle was advanced into the peritoneal space until fluid could be aspirated. The sheath was advanced and the needle removed. Four L of clearascites were aspirated.  COMPLICATIONS: None  IMPRESSION: Technically successful ultrasound guided paracentesis, removing 4 L of ascites.   Electronically Signed   By: Arnell Sieving  Hoss M.D.   On: 01/27/2015 11:18    Scheduled  Meds: . sodium chloride   Intravenous Once  . sodium chloride   Intravenous Once  . albumin human  100 g Intravenous Daily  . amitriptyline  10 mg Oral QHS  . aspirin EC  81 mg Oral Daily  . cefTRIAXone (ROCEPHIN)  IV  1 g Intravenous Q24H  . Chlorhexidine Gluconate Cloth  6 each Topical Q0600  . collagenase   Topical Daily  . feeding supplement  1 Container Oral TID BM  . feeding supplement (PRO-STAT SUGAR FREE 64)  30 mL Oral BID  . ferrous sulfate  325 mg Oral BID WC  . folic acid  1 mg Oral Daily  . furosemide  40 mg Intravenous 3 times per day  . insulin aspart  0-9 Units Subcutaneous TID WC  . lactulose  30 g Oral TID  . lactulose  300 mL Rectal BID  . magnesium oxide  400 mg Oral BID  . metronidazole  500 mg Intravenous Q8H  . midodrine  7.5 mg Oral TID WC  . mupirocin ointment  1 application Nasal BID  . octreotide  100 mcg Subcutaneous TID  . pantoprazole  40 mg Oral Daily  . rifaximin  550 mg Oral BID  . sodium chloride  500 mL Intravenous Once  . thiamine  100 mg Oral Daily   Continuous Infusions:   Active Problems:   Alcohol abuse   HTN (hypertension)   Thrombocytopenia   Bipolar affect, depressed   GI bleed   Depression, major, recurrent, moderate   MDD (major depressive disorder)   Alcoholic cirrhosis of liver with ascites   Chronic diastolic heart failure   Protein-calorie malnutrition, severe   Sepsis   Anasarca   ALC (alcoholic liver cirrhosis)   Palliative care encounter    Time spent: 30 min    Brycen Bean, [place] [facility] Pager [phone]. If 7PM-7AM, please contact night-coverage at [email or web address], password [facility] 01/29/2015, 8:06 AM  LOS: 4 days

## 2015-01-29 NOTE — Progress Notes (Signed)
Subjective:  Low grade temp overnight- UOP marginal - creatinine up slightly  Objective Vital signs in last 24 hours: Filed Vitals:   01/29/15 0705 01/29/15 0707 01/29/15 0722 01/29/15 0800  BP:   106/42   Pulse: 86 86  90  Temp:  99 F (37.2 C) 98.8 F (37.1 C)   TempSrc:  Oral Oral   Resp: 16  14   Height:      Weight:      SpO2: 98% 98%  98%   Weight change: 6.9 kg (15 lb 3.4 oz)  Intake/Output Summary (Last 24 hours) at 01/29/15 0858 Last data filed at 01/29/15 0707  Gross per 24 hour  Intake   2890 ml  Output   1071 ml  Net   1819 ml    Assessment/Plan: 59 year old WM with cirrhosis and noncompliance with really failure to thrive. He has now developed worsening renal function and decreased UOP  1.Renal- worsening renal function over the last few days in the setting of yet another hosp for encephalopathy- he was very hypotensive early in hosp so could have ATN but also hepatorenal syndrome is in the differential- urine sodium is 11 which would go along with hepatorenal. I think is reasonable to try therapy for hepatorenal in the form of octreotide/midodrine and some albumin but would not give levophed. We are in a situation that this will either get better or it wont. I would not consider him a candidate for dialysis. Continue with supportive medical care.  2. Hypertension/volume - massively volume overloaded on no diuretics, not much response to fairly low dose, will increase lasix. Also hopefully if renal function improves he will mobilize fluid on his own. Is third spacing due to hypoalbuminemia and anemia 3. Hyponatremia- due to volume - will increase diuretics 4. Anemia - due to CKD, blood draws and history of GIB. Supportive care- transfuse as needed- needs today   5. Dispo- unfortunately I think would be a candidate for hospice in either scenario but especially if his renal function does not improve.    [name] A    Labs: Basic Metabolic  Panel:  Recent Labs Lab 01/27/15 0218 01/28/15 0246 01/29/15 0245  NA 131* 129* 128*  K 4.0 3.9 4.0  CL 104 100* 101  CO2 20* 19* 18*  GLUCOSE 127* 149* 135*  BUN 59* 62* 66*  CREATININE 2.49* 2.72* 2.86*  CALCIUM 8.1* 7.9* 7.9*  PHOS  --   --  4.0   Liver Function Tests:  Recent Labs Lab 01/26/15 0820 01/27/15 0218 01/28/15 0246 01/29/15 0245  AST 88* 76* 74*  --   ALT 46 41 42  --   ALKPHOS 132* 121 125  --   BILITOT 2.3* 2.4* 1.6*  --   PROT 4.6* 4.8* 4.8*  --   ALBUMIN 1.8* 2.5* 2.3* 2.8*    Recent Labs Lab 01/25/15 1607  LIPASE 30    Recent Labs Lab 01/25/15 1623  AMMONIA 89*   CBC:  Recent Labs Lab 01/25/15 1607 01/26/15 0008 01/29/15 0245  WBC 5.7 5.7 5.2  NEUTROABS 4.1 4.1  --   HGB 8.3* 8.6* 6.2*  HCT 25.1* 25.3* 18.8*  MCV 91.9 92.0 93.1  PLT 51* 49* 47*   Cardiac Enzymes: No results for input(s): CKTOTAL, CKMB, CKMBINDEX, TROPONINI in the last 168 hours. CBG:  Recent Labs Lab 01/27/15 2125 01/28/15 0910 01/28/15 1158 01/28/15 1743 01/28/15 2236  GLUCAP 143* 171* 193* 159* 149*    Iron Studies:  Recent Labs  01/29/15 0245  IRON 20*  TIBC 153*  FERRITIN 26   Studies/Results: US Renal  01/28/2015   CLINICAL DATA:  Worsening renal function. Chronic renal disease. History of cirrhosis.  EXAM: RENAL / URINARY TRACT ULTRASOUND COMPLETE  COMPARISON:  None.  FINDINGS: Right Kidney:  Length: 12.1 cm. Echogenicity within normal limits. No mass or hydronephrosis visualized.  Left Kidney:  Length: 12.4 cm. Echogenicity within normal limits. No mass or hydronephrosis visualized.  Bladder:  Appears normal for degree of bladder distention. Foley catheter is in place. A small volume of pelvic ascites is identified.  IMPRESSION: Negative for hydronephrosis.  Normal appearing kidneys.  Small volume of pelvic ascites.   Electronically Signed   By: Inge Rise M.D.   On: 01/28/2015 19:50   US Paracentesis  01/27/2015   CLINICAL DATA:   Ascites  EXAM: ULTRASOUND GUIDED PARACENTESIS  TECHNIQUE: Survey ultrasound of the abdomen was performed and an appropriate skin entry site in the right lower abdomen was selected. Skin site was marked, prepped with Betadine, and draped in usual sterile fashion, and infiltrated locally with 1% lidocaine. A 5 French multisidehole Yueh sheath needle was advanced into the peritoneal space until fluid could be aspirated. The sheath was advanced and the needle removed. Four L of clearascites were aspirated.  COMPLICATIONS: None  IMPRESSION: Technically successful ultrasound guided paracentesis, removing 4 L of ascites.   Electronically Signed   By: Marybelle Killings M.D.   On: 01/27/2015 11:18   Medications: Infusions:    Scheduled Medications: . sodium chloride   Intravenous Once  . sodium chloride   Intravenous Once  . albumin human  100 g Intravenous Daily  . amitriptyline  10 mg Oral QHS  . cefTRIAXone (ROCEPHIN)  IV  1 g Intravenous Q24H  . Chlorhexidine Gluconate Cloth  6 each Topical Q0600  . collagenase   Topical Daily  . feeding supplement  1 Container Oral TID BM  . feeding supplement (PRO-STAT SUGAR FREE 64)  30 mL Oral BID  . ferrous sulfate  325 mg Oral BID WC  . folic acid  1 mg Oral Daily  . furosemide  40 mg Intravenous 3 times per day  . insulin aspart  0-9 Units Subcutaneous TID WC  . lactulose  30 g Oral TID  . lactulose  300 mL Rectal BID  . magnesium oxide  400 mg Oral BID  . metronidazole  500 mg Intravenous Q8H  . midodrine  10 mg Oral TID WC  . mupirocin ointment  1 application Nasal BID  . octreotide  100 mcg Subcutaneous TID  . pantoprazole  40 mg Oral Daily  . rifaximin  550 mg Oral BID  . sodium chloride  500 mL Intravenous Once  . thiamine  100 mg Oral Daily    have reviewed scheduled and prn medications.  Physical Exam: General: crying "they say I am dying" Heart: RRR Lungs: mostly clear Abdomen: diffuse ascites Extremities: 3 plus pitting  edema    01/29/2015,8:58 AM  LOS: 4 days

## 2015-01-29 NOTE — Progress Notes (Signed)
Daily Progress Note   Patient Name: [name]       Date: 01/29/2015 DOB: 1955-07-09  Age: 59 y.o. MRN#: [id number] Attending Physician: Janece Canterbury, MD Primary Care Physician: Barbette Merino, MD Admit Date: 01/25/2015  Reason for Consultation/Follow-up: Establishing goals of care and Psychosocial/spiritual support  Subjective: I read that [name] wants Serita Butcher to be his HCPOA.  I spoke with girlfriend, Serita Butcher, and she tells me that she is unsure if she is willing to be his HCPOA as [name] has a daughter and she would not want to make decisions without their support.  She also tells me she was told by nursing staff yesterday that there were two new medications that will be coming in today that will improve his kidney function.  We discussed the albumin and lasix that were given yesterday, but Ms. Belenda Cruise continues to state there are two other medications that will help.  We talk about disposition and the need for SNF or Hospice.  Ms. Belenda Cruise tells me that he wants to go live with his brother, but this brother will not be released from jail until October.  Ms. Belenda Cruise also tells me that [name] tells her that "he feels he will be ok", and that they have never talked about hospice.   I speak with [name] today, who is lying in bed uncovered.  He is again, tearful today.  We talk about his current treatment plan of albumin, lasix, and PRBC.  ( He is also recieving octreotide SQ and midodrine.)  We talk about his choice for continued aggressive treatments vs comfort care, and [name] asks about the swelling in his scrotum.  I tell him that we are doing all we can, and that I worry he may die here in the hospital.  He tells me "I want to live".   I share with him my conversation with Constance Holster, and her concerns regarding being his HCPOA, instead of daughter [name].  He then states, "[name] is a thief".     I talk with him about the benefits of Hospice, including pain relief and  dignity, and he asks if there is a doctor there.  I feel that [name] is unable to make this decision for himself, probably dt a combination of encephalopathy and denial.    Call to daughter, [name], "person you are calling can not accept calls at this time".    Girlfriend Serita Butcher arrives as I am leaving and I talk with her.  We discuss his current treatments of albumin to draw fluid into vessels, but she continues to tell me there are other medications that are to come in today to help.  She tells me that she was 'scared' of palliative care, but after reading, she wants [name] to be under palliative care, but not ready for Hospice.  She does say "I want him comfortable", but states she does want to continue current treatments of medications, blood, albumin.   I share the chronic illness trajectory, and she states she sees this in him.  Ms. Belenda Cruise talks about being together for 20 years and that she had recently separated, but has never really left him.  She talks about his desire to live and that he wants to go with his brother.  I share with her that even if we are able to get him over this illness, he will likely come back to the hospital in the same condition.  We discuss hospital  death vs Hospice death, encouraging her that she is not being pushed to make any certain decision.  She does state "I've been in denial".  And talks about her internet searches to find a cure.    Length of Stay: 4 days  Current Medications: Scheduled Meds:  . sodium chloride   Intravenous Once  . sodium chloride   Intravenous Once  . albumin human  100 g Intravenous Daily  . amitriptyline  10 mg Oral QHS  . cefTRIAXone (ROCEPHIN)  IV  1 g Intravenous Q24H  . Chlorhexidine Gluconate Cloth  6 each Topical Q0600  . collagenase   Topical Daily  . feeding supplement  1 Container Oral TID BM  . feeding supplement (PRO-STAT SUGAR FREE 64)  30 mL Oral BID  . ferrous sulfate  325 mg Oral BID WC  . folic acid   1 mg Oral Daily  . furosemide  40 mg Intravenous 3 times per day  . insulin aspart  0-9 Units Subcutaneous TID WC  . lactulose  30 g Oral TID  . lactulose  300 mL Rectal BID  . magnesium oxide  400 mg Oral BID  . metronidazole  500 mg Intravenous Q8H  . midodrine  10 mg Oral TID WC  . mupirocin ointment  1 application Nasal BID  . octreotide  100 mcg Subcutaneous TID  . pantoprazole  40 mg Oral Daily  . rifaximin  550 mg Oral BID  . sodium chloride  500 mL Intravenous Once  . thiamine  100 mg Oral Daily    Continuous Infusions:    PRN Meds: sodium chloride, albuterol, diclofenac sodium, LORazepam, ondansetron, oxyCODONE  Palliative Performance Scale: 30% at best%     Vital Signs: BP 106/42 mmHg  Pulse 90  Temp(Src) 98.8 F (37.1 C) (Oral)  Resp 14  Ht 5\' 11"  (1.803 m)  Wt 118.7 kg (261 lb 11 oz)  BMI 36.51 kg/m2  SpO2 98% SpO2: SpO2: 98 % O2 Device: O2 Device: Not Delivered O2 Flow Rate:    Intake/output summary:  Intake/Output Summary (Last 24 hours) at 01/29/15 0901 Last data filed at 01/29/15 0707  Gross per 24 hour  Intake   2890 ml  Output    961 ml  Net   1929 ml   LBM:   Baseline Weight: Weight: 114.3 kg (251 lb 15.8 oz) Most recent weight: Weight: 118.7 kg (261 lb 11 oz)  Physical Exam: Constitutional: Frail, weak, lying in bed makes but does not keep eye contact.  Resp:  resp even, rate around 16 Cardio: NSR, 80's.  GI: abd firm, distended, tender Psych: tearful , confused.  Musc: anasarca with legs,               Additional Data Reviewed: Recent Labs     01/28/15  0246  01/29/15  0245  WBC   --   5.2  HGB   --   6.2*  PLT   --   47*  NA  129*  128*  BUN  62*  66*  CREATININE  2.72*  2.86*     Problem List:  Patient Active Problem List   Diagnosis Date Noted  . Palliative care encounter   . ALC (alcoholic liver cirrhosis) 01/25/2015  . Acute urinary retention   . Moderate protein-calorie malnutrition   . Other emphysema    . Generalized weakness   . Goals of care, counseling/discussion   . Other cirrhosis of liver   . Severe muscle deconditioning   .  Weakness generalized   . Encounter for palliative care   . Anasarca   . Fall 01/17/2015  . Sepsis 01/17/2015  . Cellulitis 01/17/2015  . Lactic acidosis 01/17/2015  . Protein-calorie malnutrition, severe 12/19/2014  . Pressure ulcer 12/15/2014  . Acute respiratory failure 12/14/2014  . Acute renal failure 12/14/2014  . Chronic diastolic heart failure 92/03/9416  . Ascites   . Upper GI bleed   . Medically noncompliant 09/26/2014  . UTI (lower urinary tract infection)   . Acute blood loss anemia   . Alcoholic cirrhosis of liver without ascites   . Acute combined systolic and diastolic congestive heart failure   . AKI (acute kidney injury)   . Respiratory failure   . Unresponsive   . Hyperkalemia 07/28/2014  . Encephalopathy 07/28/2014  . Suicidal ideations   . Hx of bipolar disorder   . Alcoholic cirrhosis of liver with ascites   . PN (peripheral neuropathy)   . Retinal detachment   . MDD (major depressive disorder) 02/14/2014  . Depression, major, recurrent, moderate 02/05/2014  . Anemia 01/27/2014  . GI bleed 01/14/2014  . Altered mental status 09/13/2013  . Hypoglycemic encephalopathy 09/13/2013  . Metabolic acidosis 40/81/4481  . Acute kidney injury 09/13/2013  . Acute encephalopathy 08/28/2013  . Encephalopathy, hepatic 08/28/2013  . Pancytopenia 04/11/2013  . Falls 04/11/2013  . Hepatic encephalopathy 04/01/2013  . Tobacco abuse 03/11/2013  . Cellulitis of lower leg 12/13/2012  . Bipolar affect, depressed 12/13/2012  . Serum ammonia increased 06/20/2012  . Cirrhosis   . Thrombocytopenia 04/27/2012  . Weakness 04/27/2012  . Depression 04/27/2012  . Ulcer, stomach peptic 04/27/2012  . Acute GI bleeding 04/04/2012  . HTN (hypertension) 04/04/2012  . Diabetes mellitus 04/04/2012  . Alcohol abuse 04/08/2011    Class: Acute      Palliative Care Assessment & Plan    Code Status:  DNR  Goals of Care:  [name] is unable to make decisions at this time.  Unable to reach next of kin, girlfriend to come today.   Symptom Management:  Oxycodone 10 mg PO TID  Voltaren 1% gel to legs for pain.   Zofran 4 mg Q 8 hours PRN  Sandostatin 100 mcg SQ TID  Palliative Prophylaxis:  Taking lactulose 30 gm TID  Psycho-social/Spiritual:  Desire for further Chaplaincy support:  Not today   Prognosis: very poor prognosis, hospital death would not surprise me.  Discharge Planning: SNF vs Hospice, unsure.    Care plan was discussed with nursing staff, CM, and Dr. Sheran Fava.   Thank you for allowing the Palliative Medicine Team to assist in the care of this patient.   Time In:  0840 Time Out:  1000 Total Time   80 minutes Prolonged Time Billed  no     Greater than 50%  of this time was spent counseling and coordinating care related to the above assessment and plan.   Drue Novel, NP  01/29/2015, 9:01 AM  Please contact Palliative Medicine Team phone at [phone] for questions and concerns.

## 2015-01-29 NOTE — Progress Notes (Signed)
Albumin 5 %  = 12.5 g in 250 ml was sent by pharmacy.  Subsequently changed by pharmacist to 25%=12.5gm in 50 ml first dose given at 1435H tolerated well

## 2015-01-29 NOTE — Consult Note (Signed)
[facility] Consult   Reason for Consult:  Bipolar depression, substance abuse and hyponatremia Referring Physician:  Dr. Sheran Fava Patient Identification: [name] MRN:  [id number] Principal Diagnosis: Bipolar affect, depressed Diagnosis:   Patient Active Problem List   Diagnosis Date Noted  . Palliative care encounter [Z51.5]   . ALC (alcoholic liver cirrhosis) [K70.30] 01/25/2015  . Acute urinary retention [R33.8]   . Moderate protein-calorie malnutrition [E44.0]   . Other emphysema [J43.8]   . Generalized weakness [R53.1]   . Goals of care, counseling/discussion [Z71.89]   . Other cirrhosis of liver [K74.69]   . Severe muscle deconditioning [R29.898]   . Weakness generalized [R53.1]   . Encounter for palliative care [Z51.5]   . Anasarca [R60.1]   . Fall [W19.XXXA] 01/17/2015  . Sepsis [A41.9] 01/17/2015  . Cellulitis [L03.90] 01/17/2015  . Lactic acidosis [E87.2] 01/17/2015  . Protein-calorie malnutrition, severe [E43] 12/19/2014  . Pressure ulcer [L89.90] 12/15/2014  . Acute respiratory failure [J96.00] 12/14/2014  . Acute renal failure [N17.9] 12/14/2014  . Chronic diastolic heart failure [E33.29] 11/30/2014  . Ascites [R18.8]   . Upper GI bleed [K92.2]   . Medically noncompliant [Z91.19] 09/26/2014  . UTI (lower urinary tract infection) [N39.0]   . Acute blood loss anemia [D62]   . Alcoholic cirrhosis of liver without ascites [K70.30]   . Acute combined systolic and diastolic congestive heart failure [I50.41]   . AKI (acute kidney injury) [N17.9]   . Respiratory failure [J96.90]   . Unresponsive [R40.4]   . Hyperkalemia [E87.5] 07/28/2014  . Encephalopathy [G93.40] 07/28/2014  . Suicidal ideations [R45.851]   . Hx of bipolar disorder [Z86.59]   . Alcoholic cirrhosis of liver with ascites [K70.31]   . PN (peripheral neuropathy) [G62.9]   . Retinal detachment [H33.20]   . MDD (major depressive disorder) [F32.2] 02/14/2014  . Depression, major,  recurrent, moderate [F33.1] 02/05/2014  . Anemia [D64.9] 01/27/2014  . GI bleed [K92.2] 01/14/2014  . Altered mental status [R41.82] 09/13/2013  . Hypoglycemic encephalopathy [E16.2] 09/13/2013  . Metabolic acidosis [J18.8] 09/13/2013  . Acute kidney injury [N17.9] 09/13/2013  . Acute encephalopathy [G93.40] 08/28/2013  . Encephalopathy, hepatic [K72.90] 08/28/2013  . Pancytopenia [D61.818] 04/11/2013  . Falls [phone]] 04/11/2013  . Hepatic encephalopathy [K72.90] 04/01/2013  . Tobacco abuse [Z72.0] 03/11/2013  . Cellulitis of lower leg [L03.119] 12/13/2012  . Bipolar affect, depressed [F31.30] 12/13/2012  . Serum ammonia increased [E72.20] 06/20/2012  . Cirrhosis [K74.60]   . Thrombocytopenia [D69.6] 04/27/2012  . Weakness [R53.1] 04/27/2012  . Depression [F32.9] 04/27/2012  . Ulcer, stomach peptic [K25.9] 04/27/2012  . Acute GI bleeding [K92.2] 04/04/2012  . HTN (hypertension) [I10] 04/04/2012  . Diabetes mellitus [E11.9] 04/04/2012  . Alcohol abuse [F10.10] 04/08/2011    Class: Acute    Total Time spent with patient: 45 minutes  Subjective:   [name] is a 59 y.o. male patient admitted with depression with alcoholic cirrhosis.  HPI:  Patient is a 59 year old  seen face-to-face for psychiatric consultation and evaluation of increased symptoms of depression, sad, tearful, isolated, withdrawn, generalized weakness, abdominal discomfort. Patient reported disturbance of sleep and appetite but denies active suicidal ideation, intention or plans. He has no homicidal ideation, intention or plans. Patient has no evidence of psychosis. Patient is willing to try medication which might help him to sleep better. Patient is known to have noncompliant with his medication management.   HPI Elements:   Location:  Depression. Quality:  Poor. Severity:  Chronic with  acute exacerbation. Timing:  Noncompliance. Duration:  Few months. Context:  Psychosocial stressors and substance  abuse.  Past Medical History:  Past Medical History  Diagnosis Date  . Neuropathy   . Diabetes mellitus   . Bipolar affect, depressed   . Hypertension   . Arthritis   . Stroke     Mini stroke about 68yr ago  . Cirrhosis   . Alcohol abuse   . Chronic pain   . Cocaine abuse   . Muscle spasm     both legs  . Encephalopathy, hepatic   . Detached retina   . COPD (chronic obstructive pulmonary disease)     emphysema  . Bronchitis   . Barrett's esophagus   . GERD (gastroesophageal reflux disease)     has ulcer  . Anemia     Past Surgical History  Procedure Laterality Date  . Fracture surgery      Leg and arm 149yrago  . Esophagogastroduodenoscopy  04/04/2012    Procedure: ESOPHAGOGASTRODUODENOSCOPY (EGD);  Surgeon: JoIrene ShipperMD;  [facility];  Service: Endoscopy;  Laterality: N/A;  . Esophagogastroduodenoscopy Left 03/13/2013    Procedure: ESOPHAGOGASTRODUODENOSCOPY (EGD);  Surgeon: WiArta SilenceMD;  [facility];  Service: Endoscopy;  Laterality: Left;  . Marland Kitchenye surgery  8 months ago both eyes    cataracts both eyes, detached eye, gas pocket  . Vasectomy    . Pars plana vitrectomy Left 07/08/2013    Procedure: PARS PLANA VITRECTOMY WITH 25 GAUGE;  Surgeon: GaHurman HornMD;  [facility];  Laterality: Left;  . Intraocular lens removal Left 07/08/2013    Procedure: REMOVAL OF INTRAOCULAR LENS;  Surgeon: GaHurman HornMD;  [facility];  Laterality: Left;  . Placement and suture of secondary intraocular lens Left 07/08/2013    Procedure: PLACEMENT AND SUTURE OF SECONDARY INTRAOCULAR LENS;  Surgeon: GaHurman HornMD;  [facility];  Laterality: Left;  Insertion of Anterior Capsule Intraocular Lens   . Esophagogastroduodenoscopy N/A 01/16/2014    Procedure: ESOPHAGOGASTRODUODENOSCOPY (EGD);  Surgeon: JaWinfield Cunas MD;  [facility];  Service: Endoscopy;   Laterality: N/A;  . Colonoscopy N/A 01/17/2014    Procedure: COLONOSCOPY;  Surgeon: JaWinfield Cunas MD;  [facility];  Service: Endoscopy;  Laterality: N/A;  possible banding  . Esophagogastroduodenoscopy N/A 08/30/2014    Procedure: ESOPHAGOGASTRODUODENOSCOPY (EGD);  Surgeon: ViWilford CornerMD;  [facility];  Service: Endoscopy;  Laterality: N/A;  bedside  . Esophagogastroduodenoscopy N/A 11/30/2014    Procedure: ESOPHAGOGASTRODUODENOSCOPY (EGD);  Surgeon: ViWilford CornerMD;  [facility];  Service: Endoscopy;  Laterality: N/A;  . Esophagogastroduodenoscopy N/A 12/15/2014    Procedure: ESOPHAGOGASTRODUODENOSCOPY (EGD);  Surgeon: JoTeena IraniMD;  [facility];  Service: Endoscopy;  Laterality: N/A;   Family History:  Family History  Problem Relation Age of Onset  . Hypotension Mother    Social History:  History  Alcohol Use  . 0.0 oz/week    Comment: 12 pk beer daily  06/2013 - no alcohol since 11/2012     History  Drug Use  . Yes  . Special: Cocaine    Comment: 1 gram weekly  06/2013 - has not had any for a year    Social History   Social History  . Marital Status: Divorced    Spouse Name: N/A  . Number of Children: N/A  . Years of Education: N/A  Social History Main Topics  . Smoking status: Current Every Day Smoker -- 1.00 packs/day for 30 years    Types: Cigarettes  . Smokeless tobacco: Never Used  . Alcohol Use: 0.0 oz/week     Comment: 12 pk beer daily  06/2013 - no alcohol since 11/2012  . Drug Use: Yes    Special: Cocaine     Comment: 1 gram weekly  06/2013 - has not had any for a year  . Sexual Activity: Yes    Birth Control/ Protection: None   Other Topics Concern  . None   Social History Narrative   Additional Social History:                          Allergies:  No Known Allergies  Labs:  Results for orders placed or performed during the hospital encounter of 01/25/15 (from the past 48 hour(s))   Glucose, capillary     Status: Abnormal   Collection Time: 01/27/15 11:31 AM  Result Value Ref Range   Glucose-Capillary 166 (H) 65 - 99 mg/dL   Comment 1 Capillary Specimen   Glucose, capillary     Status: Abnormal   Collection Time: 01/27/15  4:13 PM  Result Value Ref Range   Glucose-Capillary 137 (H) 65 - 99 mg/dL   Comment 1 Capillary Specimen   Glucose, capillary     Status: Abnormal   Collection Time: 01/27/15  9:25 PM  Result Value Ref Range   Glucose-Capillary 143 (H) 65 - 99 mg/dL   Comment 1 Capillary Specimen   Comprehensive metabolic panel     Status: Abnormal   Collection Time: 01/28/15  2:46 AM  Result Value Ref Range   Sodium 129 (L) 135 - 145 mmol/L   Potassium 3.9 3.5 - 5.1 mmol/L   Chloride 100 (L) 101 - 111 mmol/L   CO2 19 (L) 22 - 32 mmol/L   Glucose, Bld 149 (H) 65 - 99 mg/dL   BUN 62 (H) 6 - 20 mg/dL   Creatinine, Ser 2.72 (H) 0.61 - 1.24 mg/dL   Calcium 7.9 (L) 8.9 - 10.3 mg/dL   Total Protein 4.8 (L) 6.5 - 8.1 g/dL   Albumin 2.3 (L) 3.5 - 5.0 g/dL   AST 74 (H) 15 - 41 U/L   ALT 42 17 - 63 U/L   Alkaline Phosphatase 125 38 - 126 U/L   Total Bilirubin 1.6 (H) 0.3 - 1.2 mg/dL   GFR calc non Af Amer 24 (L) >60 mL/min   GFR calc Af Amer 28 (L) >60 mL/min    Comment: (NOTE) The eGFR has been calculated using the CKD EPI equation. This calculation has not been validated in all clinical situations. eGFR's persistently <60 mL/min signify possible Chronic Kidney Disease.    Anion gap 10 5 - 15  Magnesium     Status: None   Collection Time: 01/28/15  2:46 AM  Result Value Ref Range   Magnesium 1.7 1.7 - 2.4 mg/dL  Protime-INR     Status: Abnormal   Collection Time: 01/28/15  2:46 AM  Result Value Ref Range   Prothrombin Time 21.6 (H) 11.6 - 15.2 seconds   INR 1.88 (H) 0.00 - 1.49  Glucose, capillary     Status: Abnormal   Collection Time: 01/28/15  9:10 AM  Result Value Ref Range   Glucose-Capillary 171 (H) 65 - 99 mg/dL   Comment 1 Notify RN    Glucose, capillary  Status: Abnormal   Collection Time: 01/28/15 11:58 AM  Result Value Ref Range   Glucose-Capillary 193 (H) 65 - 99 mg/dL   Comment 1 Notify RN   Urinalysis, Routine w reflex microscopic (not at [facility])     Status: Abnormal   Collection Time: 01/28/15  1:30 PM  Result Value Ref Range   Color, Urine YELLOW YELLOW   APPearance HAZY (A) CLEAR   Specific Gravity, Urine 1.014 1.005 - 1.030   pH 5.0 5.0 - 8.0   Glucose, UA NEGATIVE NEGATIVE mg/dL   Hgb urine dipstick LARGE (A) NEGATIVE   Bilirubin Urine NEGATIVE NEGATIVE   Ketones, ur NEGATIVE NEGATIVE mg/dL   Protein, ur NEGATIVE NEGATIVE mg/dL   Urobilinogen, UA 0.2 0.0 - 1.0 mg/dL   Nitrite NEGATIVE NEGATIVE   Leukocytes, UA SMALL (A) NEGATIVE  Sodium, urine, random     Status: None   Collection Time: 01/28/15  1:30 PM  Result Value Ref Range   Sodium, Ur 11 mmol/L  Creatinine, urine, random     Status: None   Collection Time: 01/28/15  1:30 PM  Result Value Ref Range   Creatinine, Urine 156.89 mg/dL  Urine microscopic-add on     Status: Abnormal   Collection Time: 01/28/15  1:30 PM  Result Value Ref Range   Squamous Epithelial / LPF RARE RARE   WBC, UA 11-20 <3 WBC/hpf   RBC / HPF 21-50 <3 RBC/hpf   Bacteria, UA RARE RARE   Casts GRANULAR CAST (A) NEGATIVE    Comment: HYALINE CASTS   Urine-Other RARE YEAST     Comment: LESS THAN 10 mL OF URINE SUBMITTED  Glucose, capillary     Status: Abnormal   Collection Time: 01/28/15  5:43 PM  Result Value Ref Range   Glucose-Capillary 159 (H) 65 - 99 mg/dL  Glucose, capillary     Status: Abnormal   Collection Time: 01/28/15 10:36 PM  Result Value Ref Range   Glucose-Capillary 149 (H) 65 - 99 mg/dL   Comment 1 Capillary Specimen   Renal function panel     Status: Abnormal   Collection Time: 01/29/15  2:45 AM  Result Value Ref Range   Sodium 128 (L) 135 - 145 mmol/L   Potassium 4.0 3.5 - 5.1 mmol/L   Chloride 101 101 - 111 mmol/L   CO2 18 (L) 22 - 32 mmol/L    Glucose, Bld 135 (H) 65 - 99 mg/dL   BUN 66 (H) 6 - 20 mg/dL   Creatinine, Ser 2.86 (H) 0.61 - 1.24 mg/dL   Calcium 7.9 (L) 8.9 - 10.3 mg/dL   Phosphorus 4.0 2.5 - 4.6 mg/dL   Albumin 2.8 (L) 3.5 - 5.0 g/dL   GFR calc non Af Amer 23 (L) >60 mL/min   GFR calc Af Amer 26 (L) >60 mL/min    Comment: (NOTE) The eGFR has been calculated using the CKD EPI equation. This calculation has not been validated in all clinical situations. eGFR's persistently <60 mL/min signify possible Chronic Kidney Disease.    Anion gap 9 5 - 15  CBC     Status: Abnormal   Collection Time: 01/29/15  2:45 AM  Result Value Ref Range   WBC 5.2 4.0 - 10.5 K/uL   RBC 2.02 (L) 4.22 - 5.81 MIL/uL   Hemoglobin 6.2 (LL) 13.0 - 17.0 g/dL    Comment: REPEATED TO VERIFY CRITICAL RESULT CALLED TO, READ BACK BY AND VERIFIED WITH: R Ova Freshwater [id number] WILDERK    HCT  18.8 (L) 39.0 - 52.0 %   MCV 93.1 78.0 - 100.0 fL   MCH 30.7 26.0 - 34.0 pg   MCHC 33.0 30.0 - 36.0 g/dL   RDW 21.7 (H) 11.5 - 15.5 %   Platelets 47 (L) 150 - 400 K/uL    Comment: REPEATED TO VERIFY CONSISTENT WITH PREVIOUS RESULT PLATELET COUNT CONFIRMED BY SMEAR   Protime-INR     Status: Abnormal   Collection Time: 01/29/15  2:45 AM  Result Value Ref Range   Prothrombin Time 24.3 (H) 11.6 - 15.2 seconds   INR 2.20 (H) 0.00 - 1.49  Magnesium     Status: None   Collection Time: 01/29/15  2:45 AM  Result Value Ref Range   Magnesium 1.8 1.7 - 2.4 mg/dL  Iron and TIBC     Status: Abnormal   Collection Time: 01/29/15  2:45 AM  Result Value Ref Range   Iron 20 (L) 45 - 182 ug/dL   TIBC 153 (L) 250 - 450 ug/dL   Saturation Ratios 13 (L) 17.9 - 39.5 %   UIBC 133 ug/dL  Ferritin     Status: None   Collection Time: 01/29/15  2:45 AM  Result Value Ref Range   Ferritin 26 24 - 336 ng/mL  Vitamin B12     Status: Abnormal   Collection Time: 01/29/15  2:45 AM  Result Value Ref Range   Vitamin B-12 1085 (H) 180 - 914 pg/mL    Comment: (NOTE) This  assay is not validated for testing neonatal or myeloproliferative syndrome specimens for Vitamin B12 levels.   Folate     Status: None   Collection Time: 01/29/15  2:45 AM  Result Value Ref Range   Folate 57.6 >5.9 ng/mL    Comment: RESULTS CONFIRMED BY MANUAL DILUTION  Prepare RBC     Status: None   Collection Time: 01/29/15  4:54 AM  Result Value Ref Range   Order Confirmation ORDER PROCESSED BY BLOOD BANK   Type and screen     Status: None (Preliminary result)   Collection Time: 01/29/15  5:30 AM  Result Value Ref Range   ABO/RH(D) O POS    Antibody Screen NEG    Sample Expiration 02/01/2015    Unit Number [id number]    Blood Component Type RED CELLS,LR    Unit division 00    Status of Unit ISSUED    Transfusion Status OK TO TRANSFUSE    Crossmatch Result Compatible    Unit Number [id number]    Blood Component Type RED CELLS,LR    Unit division 00    Status of Unit ALLOCATED    Transfusion Status OK TO TRANSFUSE    Crossmatch Result Compatible    Unit Number [id number]    Blood Component Type RED CELLS,LR    Unit division 00    Status of Unit ALLOCATED    Transfusion Status OK TO TRANSFUSE    Crossmatch Result Compatible   Prepare RBC     Status: None   Collection Time: 01/29/15  7:02 AM  Result Value Ref Range   Order Confirmation ORDER PROCESSED BY BLOOD BANK     Vitals: Blood pressure 106/42, pulse 90, temperature 98.8 F (37.1 C), temperature source Oral, resp. rate 14, height _0  (1.803 m), weight 118.7 kg (261 lb 11 oz), SpO2 98 %.  Risk to Self: Is patient at risk for suicide?: No Risk to Others:   Prior Inpatient Therapy:   Prior Outpatient Therapy:  Current Facility-Administered Medications  Medication Dose Route Frequency Provider Last Rate Last Dose  . 0.9 %  sodium chloride infusion   Intravenous PRN Velvet Bathe, MD   Stopped at 01/27/15 0900  . 0.9 %  sodium chloride infusion   Intravenous Once Jeryl Columbia, NP      . albumin  human 5 % solution 100 g  100 g Intravenous Daily Janece Canterbury, MD   100 g at 01/28/15 1125  . albuterol (PROVENTIL) (2.5 MG/3ML) 0.083% nebulizer solution 2.5 mg  2.5 mg Nebulization Q4H PRN Allie Bossier, MD      . amitriptyline (ELAVIL) tablet 10 mg  10 mg Oral QHS Janece Canterbury, MD   10 mg at 01/28/15 2206  . cefTRIAXone (ROCEPHIN) 1 g in dextrose 5 % 50 mL IVPB  1 g Intravenous Q24H Lauren D Bajbus, RPH 100 mL/hr at 01/28/15 2328 1 g at 01/28/15 2328  . Chlorhexidine Gluconate Cloth 2 % PADS 6 each  6 each Topical Q0600 Allie Bossier, MD   6 each at 01/29/15 0600  . collagenase (SANTYL) ointment   Topical Daily Allie Bossier, MD      . diclofenac sodium (VOLTAREN) 1 % transdermal gel 4 g  4 g Topical QID PRN Allie Bossier, MD   4 g at 01/26/15 1001  . feeding supplement (BOOST / RESOURCE BREEZE) liquid 1 Container  1 Container Oral TID BM Allie Bossier, MD   1 Container at 01/28/15 2000  . feeding supplement (PRO-STAT SUGAR FREE 64) liquid 30 mL  30 mL Oral BID Allie Bossier, MD   30 mL at 01/29/15 0913  . ferrous sulfate tablet 325 mg  325 mg Oral BID WC Janece Canterbury, MD   325 mg at 01/29/15 0912  . folic acid (FOLVITE) tablet 1 mg  1 mg Oral Daily Allie Bossier, MD   1 mg at 01/29/15 0912  . furosemide (LASIX) injection 80 mg  80 mg Intravenous 3 times per day Corliss Parish, MD      . insulin aspart (novoLOG) injection 0-9 Units  0-9 Units Subcutaneous TID WC Velvet Bathe, MD   1 Units at 01/29/15 0914  . lactulose (CHRONULAC) 10 GM/15ML solution 30 g  30 g Oral TID Allie Bossier, MD   30 g at 01/29/15 0916  . lactulose (CHRONULAC) enema 200 gm  300 mL Rectal BID Janece Canterbury, MD   300 mL at 01/28/15 1422  . LORazepam (ATIVAN) tablet 0.5 mg  0.5 mg Oral Q6H PRN Janece Canterbury, MD      . magnesium oxide (MAG-OX) tablet 400 mg  400 mg Oral BID Allie Bossier, MD   400 mg at 01/29/15 6269  . metroNIDAZOLE (FLAGYL) IVPB 500 mg  500 mg Intravenous Q8H Janece Canterbury, MD    500 mg at 01/29/15 0505  . midodrine (PROAMATINE) tablet 10 mg  10 mg Oral TID WC Janece Canterbury, MD      . mupirocin ointment (BACTROBAN) 2 % 1 application  1 application Nasal BID Allie Bossier, MD   1 application at 48/54/62 [phone]  . octreotide (SANDOSTATIN) injection 100 mcg  100 mcg Subcutaneous TID Janece Canterbury, MD   100 mcg at 01/29/15 0944  . ondansetron (ZOFRAN-ODT) disintegrating tablet 4 mg  4 mg Oral Q8H PRN Velvet Bathe, MD   4 mg at 01/27/15 1626  . oxyCODONE (Oxy IR/ROXICODONE) immediate release tablet 5 mg  5 mg Oral Q6H PRN Janece Canterbury,  MD      . pantoprazole (PROTONIX) EC tablet 40 mg  40 mg Oral Daily Allie Bossier, MD   40 mg at 01/29/15 0913  . rifaximin (XIFAXAN) tablet 550 mg  550 mg Oral BID Allie Bossier, MD   550 mg at 01/29/15 0913  . sodium chloride 0.9 % bolus 500 mL  500 mL Intravenous Once Velvet Bathe, MD   500 mL at 01/26/15 1330  . thiamine (VITAMIN B-1) tablet 100 mg  100 mg Oral Daily Allie Bossier, MD   100 mg at 01/29/15 0913    Musculoskeletal: Strength & Muscle Tone: decreased Gait & Station: unable to stand Patient leans: N/A  Psychiatric Specialty Exam: Physical Exam  ROS depression, anxiety, generalized weakness, fatigue, tired, disturbed sleep and appetite. No Fever-chills, No Headache, No changes with Vision or hearing, reports vertigo No problems swallowing food or Liquids, No Chest pain, Cough or Shortness of Breath, No Abdominal pain, No Nausea or Vommitting, Bowel movements are regular, No Blood in stool or Urine, No dysuria, No new skin rashes or bruises, No new joints pains-aches,  No new weakness, tingling, numbness in any extremity, No recent weight gain or loss, No polyuria, polydypsia or polyphagia,   A full 10 point Review of Systems was done, except as stated above, all other Review of Systems were negative.  Blood pressure 106/42, pulse 90, temperature 98.8 F (37.1 C), temperature source Oral, resp. rate 14,  height _0  (1.803 m), weight 118.7 kg (261 lb 11 oz), SpO2 98 %.Body mass index is 36.51 kg/(m^2).  General Appearance: Guarded  Eye Contact::  Good  Speech:  Clear and Coherent and Slow  Volume:  Decreased  Mood:  Anxious and Depressed  Affect:  Depressed and Tearful  Thought Process:  Coherent and Goal Directed  Orientation:  Full (Time, Place, and Person)  Thought Content:  WDL  Suicidal Thoughts:  No  Homicidal Thoughts:  No  Memory:  Immediate;   Fair Recent;   Fair  Judgement:  Fair  Insight:  Shallow  Psychomotor Activity:  Decreased  Concentration:  Fair  Recall:  [employer] of Knowledge:Fair  Language: Fair  Akathisia:  NA  Handed:  Right  AIMS (if indicated):     Assets:  Communication Skills Desire for Improvement Housing Leisure Time Resilience Social Support  ADL's:  Impaired  Cognition: WNL  Sleep:      Medical Decision Making: New problem, with additional work up planned, Review of Psycho-Social Stressors (1), Established Problem, Worsening (2), Review of Last Therapy Session (1), Review or order medicine tests (1), Review of Medication Regimen & Side Effects (2) and Review of New Medication or Change in Dosage (2)  Treatment Plan Summary: Daily contact with patient to assess and evaluate symptoms and progress in treatment and Medication management  Plan: Depression:  We gave trial of Effexor 25 mg for depression and hydroxyzine 50 mg at bedtime for insomnia - monitor for hyponatremia Patient does not meet criteria for psychiatric inpatient admission. Supportive therapy provided about ongoing stressors. Appreciate psychiatric consultation Please contact [phone] or [phone] if needs further assistance   Disposition: Patient benefit from palliative care and outpatient psychiatric medication management when medically stable.  Eddrick Dilone,JANARDHAHA R. 01/29/2015 9:49 AM

## 2015-01-29 NOTE — Progress Notes (Signed)
CRITICAL VALUE ALERT  Critical value received:  Hgb  Date of notification:  01/29/15  Time of notification:  0420  Critical value read back:Yes.    Nurse who received alert:  Thurmond Butts   MD notified (1st page):  Triad  Time of first page:  0430  Responding MD:  Triad  Time MD responded:  [phone]

## 2015-01-29 NOTE — Evaluation (Addendum)
Clinical/Bedside Swallow Evaluation Patient Details  Name: [name] MRN: [id number] Date of Birth: [birth year]  Today's Date: 01/29/2015 Time: SLP Start Time (ACUTE ONLY): 1027 SLP Stop Time (ACUTE ONLY): 1037 SLP Time Calculation (min) (ACUTE ONLY): 10 min  Past Medical History:  Past Medical History  Diagnosis Date  . Neuropathy   . Diabetes mellitus   . Bipolar affect, depressed   . Hypertension   . Arthritis   . Stroke     Mini stroke about 26yrs ago  . Cirrhosis   . Alcohol abuse   . Chronic pain   . Cocaine abuse   . Muscle spasm     both legs  . Encephalopathy, hepatic   . Detached retina   . COPD (chronic obstructive pulmonary disease)     emphysema  . Bronchitis   . Barrett's esophagus   . GERD (gastroesophageal reflux disease)     has ulcer  . Anemia    Past Surgical History:  Past Surgical History  Procedure Laterality Date  . Fracture surgery      Leg and arm 52yrs ago  . Esophagogastroduodenoscopy  04/04/2012    Procedure: ESOPHAGOGASTRODUODENOSCOPY (EGD);  Surgeon: Irene Shipper, MD;  Location: [facility] ENDOSCOPY;  Service: Endoscopy;  Laterality: N/A;  . Esophagogastroduodenoscopy Left 03/13/2013    Procedure: ESOPHAGOGASTRODUODENOSCOPY (EGD);  Surgeon: Arta Silence, MD;  Location: [facility] ENDOSCOPY;  Service: Endoscopy;  Laterality: Left;  [facility] Kitchen Eye surgery  8 months ago both eyes    cataracts both eyes, detached eye, gas pocket  . Vasectomy    . Pars plana vitrectomy Left 07/08/2013    Procedure: PARS PLANA VITRECTOMY WITH 25 GAUGE;  Surgeon: Hurman Horn, MD;  Location: [place];  Service: Ophthalmology;  Laterality: Left;  . Intraocular lens removal Left 07/08/2013    Procedure: REMOVAL OF INTRAOCULAR LENS;  Surgeon: Hurman Horn, MD;  Location: [place];  Service: Ophthalmology;  Laterality: Left;  . Placement and suture of secondary intraocular lens Left 07/08/2013    Procedure: PLACEMENT AND SUTURE OF SECONDARY INTRAOCULAR LENS;  Surgeon: Hurman Horn, MD;   Location: [place];  Service: Ophthalmology;  Laterality: Left;  Insertion of Anterior Capsule Intraocular Lens   . Esophagogastroduodenoscopy N/A 01/16/2014    Procedure: ESOPHAGOGASTRODUODENOSCOPY (EGD);  Surgeon: Winfield Cunas., MD;  Location: [facility] ENDOSCOPY;  Service: Endoscopy;  Laterality: N/A;  . Colonoscopy N/A 01/17/2014    Procedure: COLONOSCOPY;  Surgeon: Winfield Cunas., MD;  Location: [facility] ENDOSCOPY;  Service: Endoscopy;  Laterality: N/A;  possible banding  . Esophagogastroduodenoscopy N/A 08/30/2014    Procedure: ESOPHAGOGASTRODUODENOSCOPY (EGD);  Surgeon: Wilford Corner, MD;  Location: [facility] ENDOSCOPY;  Service: Endoscopy;  Laterality: N/A;  bedside  . Esophagogastroduodenoscopy N/A 11/30/2014    Procedure: ESOPHAGOGASTRODUODENOSCOPY (EGD);  Surgeon: Wilford Corner, MD;  Location: [facility] ENDOSCOPY;  Service: Endoscopy;  Laterality: N/A;  . Esophagogastroduodenoscopy N/A 12/15/2014    Procedure: ESOPHAGOGASTRODUODENOSCOPY (EGD);  Surgeon: Teena Irani, MD;  Location: [facility] ENDOSCOPY;  Service: Endoscopy;  Laterality: N/A;   HPI:  [name] is a 59 y.o. PMHx Depression, Bipolar affect, Alcohol Abuse, Cocaine abuse, Cirrhosis of liver on transplant list with Chronic Anasarca/Ascites, Diabetes Mellitus, Hypertension, COPD, CVA, Chronic pancytopenia, CKD, Barrette's esophagus, GERD, recent admission 02/12/2015 admitted with advanced Liver Cirrhosis with Anasarca (generalized edema). Per MD note pt states he alone and now understands he is incapable of taking care of himself. CXR 9/4 No active disease. Now pt with SIRS, rales and rhonchi on exam  concerning for aspiration pneumonia and BSE ordered.    Assessment / Plan / Recommendation Clinical Impression  Pt with multiple risk factors for pharyngeal and esophageal dysphagia. Despite normal appearing swallow function, without indications of decreased airway protection, he is at increased risk of aspiration. Palliative care involvement noted. Wife  states pt eats "laying down somtimes at home". SLP reviewed all pharyngeal/esophageal swallow precautions with clinical rationale during meals/snacks. Recommend contintue regular texture and thin liquids, sitting upright, stay up min 30 minutes after meals, caution with straws, pills with liquids and alternate liquids and solids. No further ST follow up at this time.    Aspiration Risk  Severe    Diet Recommendation Age appropriate regular solids;Thin   Medication Administration: Whole meds with liquid Compensations: Slow rate;Small sips/bites    Other  Recommendations Oral Care Recommendations: Oral care BID   Follow Up Recommendations       Frequency and Duration        Pertinent Vitals/Pain none    SLP Swallow Goals     Swallow Study Prior Functional Status       General Other Pertinent Information: [name] is a 59 y.o. PMHx Depression, Bipolar affect, Alcohol Abuse, Cocaine abuse, Cirrhosis of liver on transplant list with Chronic Anasarca/Ascites, Diabetes Mellitus, Hypertension, COPD, CVA, Chronic pancytopenia, CKD, Barrette's esophagus, GERD, recent admission 02/12/2015 admitted with advanced Liver Cirrhosis with Anasarca (generalized edema). Per MD note pt states he alone and now understands he is incapable of taking care of himself. CXR 9/4 No active disease. Now pt with SIRS, rales and rhonchi on exam concerning for aspiration pneumonia and BSE ordered.  Type of Study: Bedside swallow evaluation Previous Swallow Assessment:  (none found) Diet Prior to this Study: Regular;Thin liquids Temperature Spikes Noted: Yes Respiratory Status: Room air History of Recent Intubation: No Behavior/Cognition: Alert;Cooperative;Pleasant mood Oral Cavity - Dentition: Missing dentition;Poor condition (majority of lower present) Self-Feeding Abilities: Able to feed self Patient Positioning: Upright in bed Baseline Vocal Quality: Normal Volitional Cough: Strong Volitional  Swallow: Able to elicit    Oral/Motor/Sensory Function Overall Oral Motor/Sensory Function: Appears within functional limits for tasks assessed   Ice Chips Ice chips: Not tested   Thin Liquid Thin Liquid: Within functional limits Presentation: Cup;Straw Other Comments:  (questionable wet voice x 1)    Nectar Thick Nectar Thick Liquid: Not tested   Honey Thick Honey Thick Liquid: Not tested   Puree Puree: Within functional limits   Solid   GO    Solid: Not tested (pt "full"; able to masitcate meat per pt/wife)       Mick Sell, Orbie Pyo 01/29/2015,10:55 AM  Orbie Pyo Colvin Caroli.[employer] [phone]

## 2015-01-29 NOTE — Progress Notes (Signed)
PT Cancellation Note  Patient Details Name: [name] MRN: [id number] DOB: 1955/11/27   Cancelled Treatment:    Reason Eval/Treat Not Completed: Medical issues which prohibited therapy (Hgb 6.2 and not medically appropriate at this time)   Melford Aase 01/29/2015, 7:33 AM Elwyn Reach, [place]

## 2015-01-30 ENCOUNTER — Inpatient Hospital Stay (HOSPITAL_COMMUNITY): Payer: Medicare PPO

## 2015-01-30 DIAGNOSIS — R519 Headache, unspecified: Secondary | ICD-10-CM

## 2015-01-30 DIAGNOSIS — D696 Thrombocytopenia, unspecified: Secondary | ICD-10-CM

## 2015-01-30 DIAGNOSIS — R51 Headache: Secondary | ICD-10-CM

## 2015-01-30 LAB — RENAL FUNCTION PANEL
Albumin: 3.2 g/dL — ABNORMAL LOW (ref 3.5–5.0)
Anion gap: 9 (ref 5–15)
BUN: 68 mg/dL — AB (ref 6–20)
CHLORIDE: 104 mmol/L (ref 101–111)
CO2: 19 mmol/L — AB (ref 22–32)
CREATININE: 2.73 mg/dL — AB (ref 0.61–1.24)
Calcium: 8.3 mg/dL — ABNORMAL LOW (ref 8.9–10.3)
GFR calc Af Amer: 28 mL/min — ABNORMAL LOW (ref >60)
GFR, EST NON AFRICAN AMERICAN: 24 mL/min — AB (ref >60)
Glucose, Bld: 167 mg/dL — ABNORMAL HIGH (ref 65–99)
POTASSIUM: 3.8 mmol/L (ref 3.5–5.1)
Phosphorus: 3.9 mg/dL (ref 2.5–4.6)
Sodium: 132 mmol/L — ABNORMAL LOW (ref 135–145)

## 2015-01-30 LAB — CULTURE, BLOOD (ROUTINE X 2)
Culture: NO GROWTH
Culture: NO GROWTH

## 2015-01-30 LAB — GLUCOSE, CAPILLARY
GLUCOSE-CAPILLARY: 195 mg/dL — AB (ref 65–99)
GLUCOSE-CAPILLARY: 156 mg/dL — AB (ref 65–99)
GLUCOSE-CAPILLARY: 175 mg/dL — AB (ref 65–99)
Glucose-Capillary: 163 mg/dL — ABNORMAL HIGH (ref 65–99)

## 2015-01-30 LAB — CBC
HEMATOCRIT: 23.6 % — AB (ref 39.0–52.0)
Hemoglobin: 8.1 g/dL — ABNORMAL LOW (ref 13.0–17.0)
MCH: 30.9 pg (ref 26.0–34.0)
MCHC: 34.3 g/dL (ref 30.0–36.0)
MCV: 90.1 fL (ref 78.0–100.0)
PLATELETS: 33 10*3/uL — AB (ref 150–400)
RBC: 2.62 MIL/uL — ABNORMAL LOW (ref 4.22–5.81)
RDW: 20.3 % — AB (ref 11.5–15.5)
WBC: 5.5 10*3/uL (ref 4.0–10.5)

## 2015-01-30 LAB — PROTIME-INR
INR: 2.33 — AB (ref 0.00–1.49)
PROTHROMBIN TIME: 25.3 s — AB (ref 11.6–15.2)

## 2015-01-30 LAB — HEMOGLOBIN A1C
Hgb A1c MFr Bld: 5.5 % (ref 4.8–5.6)
Mean Plasma Glucose: 111 mg/dL

## 2015-01-30 LAB — MAGNESIUM: MAGNESIUM: 1.8 mg/dL (ref 1.7–2.4)

## 2015-01-30 MED ORDER — PROCHLORPERAZINE EDISYLATE 5 MG/ML IJ SOLN
10.0000 mg | Freq: Once | INTRAMUSCULAR | Status: AC
  Administered 2015-01-30: 10 mg via INTRAVENOUS
  Filled 2015-01-30: qty 2

## 2015-01-30 NOTE — Progress Notes (Signed)
Subjective:  Low grade temp overnight- making more urine and creatinine down a little Objective Vital signs in last 24 hours: Filed Vitals:   01/30/15 0500 01/30/15 0600 01/30/15 0800 01/30/15 0820  BP:  88/40 108/44   Pulse:      Temp:    99.6 F (37.6 C)  TempSrc:    Oral  Resp:  16 16   Height:      Weight: 118.117 kg (260 lb 6.4 oz)     SpO2:   98%    Weight change: -0.583 kg (-1 lb 4.6 oz)  Intake/Output Summary (Last 24 hours) at 01/30/15 8563 Last data filed at 01/30/15 0900  Gross per 24 hour  Intake   1515 ml  Output   2329 ml  Net   -814 ml    Assessment/Plan: 59 year old WM with cirrhosis and noncompliance with really failure to thrive. He has now developed worsening renal function and decreased UOP  1.Renal- worsening renal function over the last few days in the setting of yet another hosp for encephalopathy- he was very hypotensive early in hosp so could have ATN but also hepatorenal syndrome is in the differential.Have been giving therapy for hepatorenal in the form of octreotide/midodrine and a little albumin. We are in a situation that this will either get better or it wont- possibly trending better?  I would not consider him a candidate for dialysis. Continue with supportive medical care.  2. Hypertension/volume - massively volume overloaded, now on lasix making pretty good urine. Also hopefully if renal function improves he will mobilize fluid on his own. Is third spacing due to hypoalbuminemia and anemia- keep lasix dosing the same 3. Hyponatremia- due to volume - improved with more UOP 4. Anemia - due to CKD, blood draws and history of GIB. Supportive care- transfuse as needed- s/p transfusion yest 5. Dispo- unfortunately I think would be a candidate for hospice in either scenario    [name] A    Labs: Basic Metabolic Panel:  Recent Labs Lab 01/28/15 0246 01/29/15 0245 01/30/15 0348  NA 129* 128* 132*  K 3.9 4.0 3.8  CL 100*  101 104  CO2 19* 18* 19*  GLUCOSE 149* 135* 167*  BUN 62* 66* 68*  CREATININE 2.72* 2.86* 2.73*  CALCIUM 7.9* 7.9* 8.3*  PHOS  --  4.0 3.9   Liver Function Tests:  Recent Labs Lab 01/26/15 0820 01/27/15 0218 01/28/15 0246 01/29/15 0245 01/30/15 0348  AST 88* 76* 74*  --   --   ALT 46 41 42  --   --   ALKPHOS 132* 121 125  --   --   BILITOT 2.3* 2.4* 1.6*  --   --   PROT 4.6* 4.8* 4.8*  --   --   ALBUMIN 1.8* 2.5* 2.3* 2.8* 3.2*    Recent Labs Lab 01/25/15 1607  LIPASE 30    Recent Labs Lab 01/25/15 1623  AMMONIA 89*   CBC:  Recent Labs Lab 01/25/15 1607 01/26/15 0008 01/29/15 0245 01/30/15 0348  WBC 5.7 5.7 5.2 5.5  NEUTROABS 4.1 4.1  --   --   HGB 8.3* 8.6* 6.2* 8.1*  HCT 25.1* 25.3* 18.8* 23.6*  MCV 91.9 92.0 93.1 90.1  PLT 51* 49* 47* 33*   Cardiac Enzymes: No results for input(s): CKTOTAL, CKMB, CKMBINDEX, TROPONINI in the last 168 hours. CBG:  Recent Labs Lab 01/29/15 0728 01/29/15 1144 01/29/15 1621 01/29/15 2146 01/30/15 0831  GLUCAP 127* 133* 136* 160* 195*  Iron Studies:   Recent Labs  01/29/15 0245  IRON 20*  TIBC 153*  FERRITIN 26   Studies/Results: US Renal  01/28/2015   CLINICAL DATA:  Worsening renal function. Chronic renal disease. History of cirrhosis.  EXAM: RENAL / URINARY TRACT ULTRASOUND COMPLETE  COMPARISON:  None.  FINDINGS: Right Kidney:  Length: 12.1 cm. Echogenicity within normal limits. No mass or hydronephrosis visualized.  Left Kidney:  Length: 12.4 cm. Echogenicity within normal limits. No mass or hydronephrosis visualized.  Bladder:  Appears normal for degree of bladder distention. Foley catheter is in place. A small volume of pelvic ascites is identified.  IMPRESSION: Negative for hydronephrosis.  Normal appearing kidneys.  Small volume of pelvic ascites.   Electronically Signed   By: Inge Rise M.D.   On: 01/28/2015 19:50   Medications: Infusions:    Scheduled Medications: . sodium chloride    Intravenous Once  . amitriptyline  10 mg Oral QHS  . cefTRIAXone (ROCEPHIN)  IV  1 g Intravenous Q24H  . collagenase   Topical Daily  . feeding supplement  1 Container Oral TID BM  . feeding supplement (PRO-STAT SUGAR FREE 64)  30 mL Oral BID  . ferrous sulfate  325 mg Oral BID WC  . folic acid  1 mg Oral Daily  . furosemide  80 mg Intravenous 3 times per day  . hydrOXYzine  50 mg Oral QHS  . insulin aspart  0-9 Units Subcutaneous TID WC  . lactulose  30 g Oral TID  . lactulose  300 mL Rectal BID  . magnesium oxide  400 mg Oral BID  . metronidazole  500 mg Intravenous Q8H  . midodrine  10 mg Oral TID WC  . octreotide  100 mcg Subcutaneous TID  . pantoprazole  40 mg Oral Daily  . rifaximin  550 mg Oral BID  . sodium chloride  500 mL Intravenous Once  . thiamine  100 mg Oral Daily  . venlafaxine  25 mg Oral BID WC    have reviewed scheduled and prn medications.  Physical Exam: General: in better spirits Heart: RRR Lungs: mostly clear Abdomen: diffuse ascites Extremities: 3 plus pitting edema    01/30/2015,9:22 AM  LOS: 5 days

## 2015-01-30 NOTE — Progress Notes (Signed)
[facility] PROGRESS NOTE  [name] PYP:[id number] DOB: 04/23/1956 DOA: 01/25/2015 PCP: Barbette Merino, MD  Brief Summary  Patient is a 59 year old with history of alcoholic liver cirrhosis presenting with abdominal discomfort and SIRs.  He has had 9 admissions in the last 6 months and 5 ER visits.  Most admissions have been for hepatic encephalopathy and falls, however, in July he had GIB.  On several admissions, he required intubation for his encephalopathy.  He has been noncompliant with his medications, including diuretics.  He continues to require large volume paracenteses, sometimes every few days.   He continues to use cocaine and is on narcotics for chronic pain and clonazepam for "bipolar" all of which likely contribute to his encephalopathy.    Assessment/Plan  Alcoholic liver cirrhosis with portal hypertensive gastropathy, hx of GIB, recurrent hepatic encephalopathy, currently with confusion and slowed mentation consistent with hepatic encephalopathy.  MELD ([facility] and [facility]OS) 25.  All alcohol levels have been less than threshold for last two years.   -  continue rifaximin and lactulose -  If consistently taking his oral lactulose, will d/c prn enemas -  Stop/minimize sedating medications -  Appreciate Palliative care assistance  Worst headache of my life may be side effect of medications, illness, sleep deprivation, but concerning for hemorrhage given his low platelet count.  No fevers or leukocytosis or nuchal rigidity to suggest meningitis -  CT head -  Oxycodone and compazine x 1 for headache  AKI possibly due to ATN from hypotension, but concerning for hepatorenal syndrome.  Uop increasing and creatinine trending down  -  RUS:  Normal appearing kidneys with no hydronephrosis or signs of obstruction -  FENa 0.15% and UA with granular casts -  Continue octreotide, and midodrine day 3 -  Continue midodrine to 10mg  TID -  Appreciate Nephrology assistance  SIRS with rales and  rhonchi on exam concerning for aspiration pneumonia -  Cell count not obtained on paracentesis, but culture is currently negative at 3 days, so I doubt this was the source -  Continue flagyl and ceftriaxone day 3 of 7 -  Speech therapy consultation for swallow eval:  Regular diet -  Blood cultures NGTD  Anasarca and hyponatremia due to cirrhosis.  Hyponatremia improving. -  s/p paracentesis -  Diuretics per nephrology  Hyperglycemia -  A1c pending -  Continue SSI pending A1c  Alcohol abuse -  All alcohol levels have been less than threshold for last two years.    Hypotension - Hypotension improved, but BP soft again this morning prior to AM dose of midodrine -  Continue midodrine  Protein-calorie malnutrition, severe  - currently on nutritional supplementation.  Nausea - provide antiemetics  Normocytic anemia likely due to recent blood loss/iron deficiency anemia and renal failure, hemoglobin trending down -  TSH wnl, b12 wnl, folate wnl -  Transfuse 2 units PRBC on 9/8 -  Repeat iron studies c/w iron deficiency, start ferrous sulfate supplementation  Bipolar 1 d/o and constantly tearful, depressed -  Psychiatry assistance appreciated.  Please keep hyponatremia in mind when considering therapies  Diet:  Diabetic  Access:  PIV IVF:  albumin Proph:  SCDs  Code Status: DNR Family Communication: patient alone Disposition Plan: pending improvement in kidney function.  Patient would be an excellent candidate for hospice, particularly if his kidney function improves.     Consultants:  IR  Palliative care  Nephrology  Procedures:  Paracentesis  Antibiotics:  Rocephin and Xifaxan  Flagyl added on  9/7  HPI/Subjective:  Having frequent small soft BMs according to RN.  Still confused and swollen and aching all over.  Has worst headache of his life with all around headache 9/10.  Denies focal numbness, weakness, increased speech difficulties. Fuzzy thinking is  about the same.     Objective: Filed Vitals:   01/30/15 0200 01/30/15 0400 01/30/15 0500 01/30/15 0600  BP: 102/49 100/48  88/40  Pulse:      Temp:  98 F (36.7 C)    TempSrc:  Oral    Resp: 14 15  16   Height:      Weight:   118.117 kg (260 lb 6.4 oz)   SpO2:  98%      Intake/Output Summary (Last 24 hours) at 01/30/15 0728 Last data filed at 01/30/15 0643  Gross per 24 hour  Intake   1515 ml  Output   1979 ml  Net   -464 ml   Filed Weights   01/28/15 0428 01/29/15 0419 01/30/15 0500  Weight: 111.8 kg (246 lb 7.6 oz) 118.7 kg (261 lb 11 oz) 118.117 kg (260 lb 6.4 oz)   Body mass index is 36.33 kg/(m^2).  Exam:   General:  Adult male, No acute distress, speech is brisker and he can focus better on conversation and questions, but still not oriented  HEENT:  NCAT, MMM  Cardiovascular:  RRR, nl S1, S2 2/6 systolic murmur, 2+ pulses, warm extremities  Respiratory:  CTAB anteriorly, no increased WOB  Abdomen:   NABS, soft, moderately distended, firm but not tense, diffusely tender to palpation without rebound or guarding  MSK:   Normal tone and bulk, massively volume overloaded on his legs, 3+ pitting edema in dependent areas, scrotum massively swollen  Neuro:   3/5 strength upper extremities and RLE, 4/5 LLE.    Psych:  A&O to person, place, but not date, month, or reason for hospitalization. Less tearful today  Data Reviewed: Basic Metabolic Panel:  Recent Labs Lab 01/26/15 0820 01/27/15 0218 01/28/15 0246 01/29/15 0245 01/30/15 0348  NA 129* 131* 129* 128* 132*  K 3.6 4.0 3.9 4.0 3.8  CL 102 104 100* 101 104  CO2 18* 20* 19* 18* 19*  GLUCOSE 108* 127* 149* 135* 167*  BUN 57* 59* 62* 66* 68*  CREATININE 2.18* 2.49* 2.72* 2.86* 2.73*  CALCIUM 7.8* 8.1* 7.9* 7.9* 8.3*  MG 1.7 1.7 1.7 1.8 1.8  PHOS  --   --   --  4.0 3.9   Liver Function Tests:  Recent Labs Lab 01/25/15 1607 01/26/15 0820 01/27/15 0218 01/28/15 0246 01/29/15 0245 01/30/15 0348   AST 105* 88* 76* 74*  --   --   ALT 54 46 41 42  --   --   ALKPHOS 148* 132* 121 125  --   --   BILITOT 2.4* 2.3* 2.4* 1.6*  --   --   PROT 5.1* 4.6* 4.8* 4.8*  --   --   ALBUMIN 2.1* 1.8* 2.5* 2.3* 2.8* 3.2*    Recent Labs Lab 01/25/15 1607  LIPASE 30    Recent Labs Lab 01/25/15 1623  AMMONIA 89*   CBC:  Recent Labs Lab 01/25/15 1607 01/26/15 0008 01/29/15 0245 01/30/15 0348  WBC 5.7 5.7 5.2 5.5  NEUTROABS 4.1 4.1  --   --   HGB 8.3* 8.6* 6.2* 8.1*  HCT 25.1* 25.3* 18.8* 23.6*  MCV 91.9 92.0 93.1 90.1  PLT 51* 49* 47* 33*    Recent Results (from the  past 240 hour(s))  Blood culture (routine x 2)     Status: None (Preliminary result)   Collection Time: 01/25/15  4:08 PM  Result Value Ref Range Status   Specimen Description BLOOD RIGHT FOREARM  Final   Special Requests BOTTLES DRAWN AEROBIC AND ANAEROBIC 5CC  Final   Culture NO GROWTH 4 DAYS  Final   Report Status PENDING  Incomplete  Blood culture (routine x 2)     Status: None (Preliminary result)   Collection Time: 01/25/15  4:23 PM  Result Value Ref Range Status   Specimen Description BLOOD LEFT HAND  Final   Special Requests BOTTLES DRAWN AEROBIC AND ANAEROBIC 5CC  Final   Culture NO GROWTH 4 DAYS  Final   Report Status PENDING  Incomplete  Body fluid culture     Status: None   Collection Time: 01/25/15  9:27 PM  Result Value Ref Range Status   Specimen Description FLUID PERITONEAL  Final   Special Requests Normal  Final   Gram Stain   Final    FEW WBC PRESENT,BOTH PMN AND MONONUCLEAR NO ORGANISMS SEEN    Culture NO GROWTH 3 DAYS  Final   Report Status 01/29/2015 FINAL  Final  Urine culture     Status: None   Collection Time: 01/25/15 11:00 PM  Result Value Ref Range Status   Specimen Description URINE, CATHETERIZED  Final   Special Requests NONE  Final   Culture NO GROWTH 1 DAY  Final   Report Status 01/27/2015 FINAL  Final  MRSA PCR Screening     Status: Abnormal   Collection Time: 01/25/15  11:00 PM  Result Value Ref Range Status   MRSA by PCR POSITIVE (A) NEGATIVE Final    Comment:        The GeneXpert MRSA Assay (FDA approved for NASAL specimens only), is one component of a comprehensive MRSA colonization surveillance program. It is not intended to diagnose MRSA infection nor to guide or monitor treatment for MRSA infections. RESULT CALLED TO, READ BACK BY AND VERIFIED WITH: RN ANN DILLARD [id number] @0045  THANEY      Studies: US Renal  01/28/2015   CLINICAL DATA:  Worsening renal function. Chronic renal disease. History of cirrhosis.  EXAM: RENAL / URINARY TRACT ULTRASOUND COMPLETE  COMPARISON:  None.  FINDINGS: Right Kidney:  Length: 12.1 cm. Echogenicity within normal limits. No mass or hydronephrosis visualized.  Left Kidney:  Length: 12.4 cm. Echogenicity within normal limits. No mass or hydronephrosis visualized.  Bladder:  Appears normal for degree of bladder distention. Foley catheter is in place. A small volume of pelvic ascites is identified.  IMPRESSION: Negative for hydronephrosis.  Normal appearing kidneys.  Small volume of pelvic ascites.   Electronically Signed   By: Inge Rise M.D.   On: 01/28/2015 19:50    Scheduled Meds: . sodium chloride   Intravenous Once  . amitriptyline  10 mg Oral QHS  . cefTRIAXone (ROCEPHIN)  IV  1 g Intravenous Q24H  . collagenase   Topical Daily  . feeding supplement  1 Container Oral TID BM  . feeding supplement (PRO-STAT SUGAR FREE 64)  30 mL Oral BID  . ferrous sulfate  325 mg Oral BID WC  . folic acid  1 mg Oral Daily  . furosemide  80 mg Intravenous 3 times per day  . hydrOXYzine  50 mg Oral QHS  . insulin aspart  0-9 Units Subcutaneous TID WC  . lactulose  30 g Oral TID  .  lactulose  300 mL Rectal BID  . magnesium oxide  400 mg Oral BID  . metronidazole  500 mg Intravenous Q8H  . midodrine  10 mg Oral TID WC  . mupirocin ointment  1 application Nasal BID  . octreotide  100 mcg Subcutaneous TID  . pantoprazole   40 mg Oral Daily  . prochlorperazine  10 mg Intravenous Once  . rifaximin  550 mg Oral BID  . sodium chloride  500 mL Intravenous Once  . thiamine  100 mg Oral Daily  . venlafaxine  25 mg Oral BID WC   Continuous Infusions:   Principal Problem:   Bipolar affect, depressed Active Problems:   Alcohol abuse   HTN (hypertension)   Thrombocytopenia   GI bleed   Depression, major, recurrent, moderate   MDD (major depressive disorder)   Alcoholic cirrhosis of liver with ascites   Chronic diastolic heart failure   Protein-calorie malnutrition, severe   Sepsis   Anasarca   ALC (alcoholic liver cirrhosis)   Palliative care encounter    Time spent: 30 min    Azalynn Maxim, [place] [facility] Pager [phone]. If 7PM-7AM, please contact night-coverage at [email or web address], password [facility] 01/30/2015, 7:28 AM  LOS: 5 days

## 2015-01-30 NOTE — Progress Notes (Signed)
   01/30/15 1600  Clinical Encounter Type  Visited With Patient  Visit Type Other (Comment) (Advance Directive)  Referral From Nurse  Chaplain visited patient; patient was asleep and moved from original floor to [facility]

## 2015-01-30 NOTE — Progress Notes (Signed)
Physical Therapy Treatment Patient Details Name: [name] MRN: [id number] DOB: 25-May-1955 Today's Date: 01/30/2015    History of Present Illness 59 y.o. WM PMHx Depression, Bipolar affect, Alcohol Abuse, Cocaine abuse, Cirrhosis of liver on transplant list with Chronic Anasarca/Ascites, Diabetes Mellitus, Hypertension, COPD, CVA, Chronic pancytopenia, CKD(baseline Cr 1.6-2.0), GERD. Pt just discharged on 01/23/2015 . Admitted with pain and ascites    PT Comments    [name] is following commands more consistently today and demonstrating increased balance in standing. Continues to be limited by fatigue, edema, pain and weakness. Pt benefits from encouragement and reassurance. Will continue to follow.   Follow Up Recommendations  SNF;Supervision/Assistance - 24 hour     Equipment Recommendations       Recommendations for Other Services       Precautions / Restrictions Precautions Precautions: Fall Precaution Comments: very swallon scrotum, incontinent of stool Restrictions Weight Bearing Restrictions: No    Mobility  Bed Mobility Overal bed mobility: Needs Assistance Bed Mobility: Supine to Sit     Supine to sit: Min assist     General bed mobility comments: wtih rail and HOB elevated 20 degrees min assist to fully transfer to sitting and scoot to EOB with assist of pad  Transfers Overall transfer level: Needs assistance   Transfers: Sit to/from Stand Sit to Stand: Min assist Stand pivot transfers: Min assist       General transfer comment: pt stood from bed and commode with cues for hand placement, assist for anterior translation and safety, in standing from bed pt incontinent of stool and pivoted to [facility]  Ambulation/Gait Ambulation/Gait assistance: Min assist;+2 safety/equipment Ambulation Distance (Feet): 25 Feet Assistive device: Rolling walker (2 wheeled) Gait Pattern/deviations: Shuffle;Wide base of support;Trunk flexed   Gait velocity interpretation:  Below normal speed for age/gender General Gait Details: pt with wide BOS, flexed posture and mod cues for position in RW. pt with increased stability and safety with gait today compared to eval   Stairs            Wheelchair Mobility    Modified Rankin (Stroke Patients Only)       Balance                                    Cognition Arousal/Alertness: Awake/alert Behavior During Therapy: Flat affect Overall Cognitive Status: Impaired/Different from baseline Area of Impairment: Following commands;Safety/judgement;Problem solving       Following Commands: Follows one step commands consistently Safety/Judgement: Decreased awareness of safety;Decreased awareness of deficits   Problem Solving: Slow processing;Decreased initiation;Requires verbal cues;Requires tactile cues General Comments: Pt with improved command following from last session    Exercises General Exercises - Lower Extremity Long Arc Quad: AROM;Seated;Both;10 reps Hip Flexion/Marching: AROM;Seated;Both;10 reps    General Comments        Pertinent Vitals/Pain Pain Assessment: Faces Faces Pain Scale: Hurts even more Pain Location: scrotum and bil LE Pain Descriptors / Indicators: Sore Pain Intervention(s): Limited activity within patient's tolerance;Repositioned;Patient requesting pain meds-RN notified  HR 81-91 BP 108/44    Home Living                      Prior Function            PT Goals (current goals can now be found in the care plan section) Progress towards PT goals: Progressing toward goals    Frequency  PT Plan Current plan remains appropriate    Co-evaluation             End of Session Equipment Utilized During Treatment: Gait belt Activity Tolerance: Patient tolerated treatment well Patient left: in chair;with call bell/phone within reach;with chair alarm set;with nursing/sitter in room     Time: 7711-6579 PT Time Calculation (min)  (ACUTE ONLY): 30 min  Charges:  $Gait Training: 8-22 mins $Therapeutic Activity: 8-22 mins                    G Codes:      Melford Aase 02/04/2015, 10:30 AM Elwyn Reach, [place]

## 2015-01-31 DIAGNOSIS — G9340 Encephalopathy, unspecified: Secondary | ICD-10-CM

## 2015-01-31 LAB — RENAL FUNCTION PANEL
ALBUMIN: 3 g/dL — AB (ref 3.5–5.0)
ANION GAP: 11 (ref 5–15)
BUN: 66 mg/dL — AB (ref 6–20)
CO2: 17 mmol/L — ABNORMAL LOW (ref 22–32)
Calcium: 8 mg/dL — ABNORMAL LOW (ref 8.9–10.3)
Chloride: 103 mmol/L (ref 101–111)
Creatinine, Ser: 2.45 mg/dL — ABNORMAL HIGH (ref 0.61–1.24)
GFR, EST AFRICAN AMERICAN: 32 mL/min — AB (ref >60)
GFR, EST NON AFRICAN AMERICAN: 27 mL/min — AB (ref >60)
Glucose, Bld: 163 mg/dL — ABNORMAL HIGH (ref 65–99)
PHOSPHORUS: 3.6 mg/dL (ref 2.5–4.6)
POTASSIUM: 3.5 mmol/L (ref 3.5–5.1)
Sodium: 131 mmol/L — ABNORMAL LOW (ref 135–145)

## 2015-01-31 LAB — GLUCOSE, CAPILLARY
GLUCOSE-CAPILLARY: 155 mg/dL — AB (ref 65–99)
GLUCOSE-CAPILLARY: 213 mg/dL — AB (ref 65–99)
Glucose-Capillary: 168 mg/dL — ABNORMAL HIGH (ref 65–99)

## 2015-01-31 LAB — CBC
HEMATOCRIT: 25.7 % — AB (ref 39.0–52.0)
HEMOGLOBIN: 8.7 g/dL — AB (ref 13.0–17.0)
MCH: 31.2 pg (ref 26.0–34.0)
MCHC: 33.9 g/dL (ref 30.0–36.0)
MCV: 92.1 fL (ref 78.0–100.0)
Platelets: 54 10*3/uL — ABNORMAL LOW (ref 150–400)
RBC: 2.79 MIL/uL — ABNORMAL LOW (ref 4.22–5.81)
RDW: 20.3 % — AB (ref 11.5–15.5)
WBC: 5.3 10*3/uL (ref 4.0–10.5)

## 2015-01-31 LAB — PROTIME-INR
INR: 2.27 — AB (ref 0.00–1.49)
Prothrombin Time: 24.8 seconds — ABNORMAL HIGH (ref 11.6–15.2)

## 2015-01-31 LAB — MAGNESIUM: Magnesium: 1.8 mg/dL (ref 1.7–2.4)

## 2015-01-31 MED ORDER — OCTREOTIDE ACETATE 100 MCG/ML IJ SOLN
100.0000 ug | Freq: Three times a day (TID) | INTRAMUSCULAR | Status: DC
  Administered 2015-01-31 – 2015-02-02 (×6): 100 ug via SUBCUTANEOUS
  Filled 2015-01-31 (×8): qty 1

## 2015-01-31 MED ORDER — OCTREOTIDE ACETATE 100 MCG/ML IJ SOLN
100.0000 ug | Freq: Once | INTRAMUSCULAR | Status: AC
  Administered 2015-01-31: 100 ug via SUBCUTANEOUS
  Filled 2015-01-31: qty 1

## 2015-01-31 NOTE — Progress Notes (Signed)
Subjective:  Temp resolved- making more urine and creatinine down a little again Objective Vital signs in last 24 hours: Filed Vitals:   01/30/15 1440 01/30/15 2100 01/31/15 0517 01/31/15 0751  BP: 116/60 123/65 104/64 109/56  Pulse: 73 81 82 84  Temp: 98.1 F (36.7 C) 98.4 F (36.9 C) 98 F (36.7 C) 98.1 F (36.7 C)  TempSrc: Oral   Oral  Resp: 14 19 18 18   Height:      Weight:  109.77 kg (242 lb)    SpO2: 98% 97% 98% 96%   Weight change: -8.346 kg (-18 lb 6.4 oz)  Intake/Output Summary (Last 24 hours) at 01/31/15 1042 Last data filed at 01/31/15 0932  Gross per 24 hour  Intake    220 ml  Output   2075 ml  Net  -1855 ml    Assessment/Plan: 59 year old WM with cirrhosis and noncompliance with really failure to thrive. He has now developed worsening renal function and decreased UOP  1.Renal- worsening renal function  in the setting of yet another hosp for encephalopathy- he was very hypotensive early in hosp so could have ATN but also hepatorenal syndrome is in the differential.Have been giving therapy for hepatorenal in the form of octreotide/midodrine. I would not consider him a candidate for dialysis. Seem to be trending better with current regimen. Continue with supportive medical care.  2. Hypertension/volume - massively volume overloaded, now on lasix making pretty good urine. Also hopefully if renal function improves he will mobilize fluid on his own. Is third spacing due to hypoalbuminemia and anemia- keep lasix dosing the same at 80 IV  q 8 hours while in house- then would do 80 PO BID when sent out- 3. Hyponatremia- due to volume - will improve with more UOP 4. Anemia - due to CKD, blood draws and history of GIB. Supportive care- transfuse as needed- s/p transfusion 9/8 5. Dispo- unfortunately I think would be a candidate for hospice in either scenario   Patient is relatively stable from a renal standpoint.  We will sign off but call back if  concerns   [name] A    Labs: Basic Metabolic Panel:  Recent Labs Lab 01/29/15 0245 01/30/15 0348 01/31/15 0448  NA 128* 132* 131*  K 4.0 3.8 3.5  CL 101 104 103  CO2 18* 19* 17*  GLUCOSE 135* 167* 163*  BUN 66* 68* 66*  CREATININE 2.86* 2.73* 2.45*  CALCIUM 7.9* 8.3* 8.0*  PHOS 4.0 3.9 3.6   Liver Function Tests:  Recent Labs Lab 01/26/15 0820 01/27/15 0218 01/28/15 0246 01/29/15 0245 01/30/15 0348 01/31/15 0448  AST 88* 76* 74*  --   --   --   ALT 46 41 42  --   --   --   ALKPHOS 132* 121 125  --   --   --   BILITOT 2.3* 2.4* 1.6*  --   --   --   PROT 4.6* 4.8* 4.8*  --   --   --   ALBUMIN 1.8* 2.5* 2.3* 2.8* 3.2* 3.0*    Recent Labs Lab 01/25/15 1607  LIPASE 30    Recent Labs Lab 01/25/15 1623  AMMONIA 89*   CBC:  Recent Labs Lab 01/25/15 1607 01/26/15 0008 01/29/15 0245 01/30/15 0348 01/31/15 0448  WBC 5.7 5.7 5.2 5.5 5.3  NEUTROABS 4.1 4.1  --   --   --   HGB 8.3* 8.6* 6.2* 8.1* 8.7*  HCT 25.1* 25.3* 18.8* 23.6* 25.7*  MCV 91.9 92.0 93.1 90.1 92.1  PLT 51* 49* 47* 33* 54*   Cardiac Enzymes: No results for input(s): CKTOTAL, CKMB, CKMBINDEX, TROPONINI in the last 168 hours. CBG:  Recent Labs Lab 01/30/15 0831 01/30/15 1247 01/30/15 1703 01/30/15 2112 01/31/15 0746  GLUCAP 195* 156* 163* 175* 155*    Iron Studies:   Recent Labs  01/29/15 0245  IRON 20*  TIBC 153*  FERRITIN 26   Studies/Results: Ct Head Wo Contrast  01/30/2015   CLINICAL DATA:  History of alcoholic liver cirrhosis. Abdominal discomfort. History of hepatic encephalopathy and falls. History of GI bleeding. Worse headache of life. 01/17/2015  EXAM: CT HEAD WITHOUT CONTRAST  TECHNIQUE: Contiguous axial images were obtained from the base of the skull through the vertex without intravenous contrast.  COMPARISON:  None.  FINDINGS: There is mild central and cortical atrophy. Periventricular white matter changes are consistent with small vessel disease.  There is no intra or extra-axial fluid collection or mass lesion. The basilar cisterns and ventricles have a normal appearance. There is no CT evidence for acute infarction or hemorrhage. Bone windows show no acute findings.  There is patient motion artifact, degrading image quality.  IMPRESSION: 1. Atrophy and small vessel disease. 2.  No evidence for acute intracranial abnormality.   Electronically Signed   By: Nolon Nations M.D.   On: 01/30/2015 12:44   Medications: Infusions:    Scheduled Medications: . sodium chloride   Intravenous Once  . amitriptyline  10 mg Oral QHS  . cefTRIAXone (ROCEPHIN)  IV  1 g Intravenous Q24H  . collagenase   Topical Daily  . feeding supplement  1 Container Oral TID BM  . feeding supplement (PRO-STAT SUGAR FREE 64)  30 mL Oral BID  . ferrous sulfate  325 mg Oral BID WC  . folic acid  1 mg Oral Daily  . furosemide  80 mg Intravenous 3 times per day  . hydrOXYzine  50 mg Oral QHS  . insulin aspart  0-9 Units Subcutaneous TID WC  . lactulose  30 g Oral TID  . lactulose  300 mL Rectal BID  . magnesium oxide  400 mg Oral BID  . metronidazole  500 mg Intravenous Q8H  . midodrine  10 mg Oral TID WC  . octreotide  100 mcg Subcutaneous TID  . pantoprazole  40 mg Oral Daily  . rifaximin  550 mg Oral BID  . sodium chloride  500 mL Intravenous Once  . thiamine  100 mg Oral Daily  . venlafaxine  25 mg Oral BID WC    have reviewed scheduled and prn medications.  Physical Exam: General: in better spirits- family at bedside asking about liver transplant  Heart: RRR Lungs: mostly clear Abdomen: diffuse ascites Extremities: 3 plus pitting edema    01/31/2015,10:42 AM  LOS: 6 days

## 2015-01-31 NOTE — Progress Notes (Signed)
[facility] PROGRESS NOTE  [name] BHA:[id number] DOB: 1956-02-27 DOA: 01/25/2015 PCP: Barbette Merino, MD  Brief Summary  Patient is a 59 year old with history of alcoholic liver cirrhosis presenting with abdominal discomfort and SIRs.  He has had 9 admissions in the last 6 months and 5 ER visits.  Most admissions have been for hepatic encephalopathy and falls, however, in July he had GIB.  On several admissions, he required intubation for his encephalopathy.  He has been noncompliant with his medications, including diuretics.  He continues to require large volume paracenteses, sometimes every few days.   He continues to use cocaine and is on narcotics for chronic pain and clonazepam for "bipolar" all of which likely contribute to his encephalopathy.    Assessment/Plan  Alcoholic liver cirrhosis with portal hypertensive gastropathy, hx of GIB, recurrent hepatic encephalopathy, currently with confusion and slowed mentation consistent with hepatic encephalopathy.  MELD ([facility] and UNOS) 25.  All alcohol levels have been less than threshold for last two years.   -  continue rifaximin and lactulose -  Appreciate Palliative care assistance  Worst headache of my life may be side effect of medications, illness, sleep deprivation, resolved with oxycodone and compazine -  CT head:  Wnl  AKI possibly due to ATN from hypotension, but concerning for hepatorenal syndrome.  Uop increasing and creatinine trending down  -  RUS:  Normal appearing kidneys with no hydronephrosis or signs of obstruction -  FENa 0.15% and UA with granular casts -  Continue octreotide, and midodrine day 4 -  Appreciate Nephrology assistance  Anasarca and hyponatremia due to cirrhosis.  Hyponatremia improving. -  s/p paracentesis -  Diuretics per nephrology  SIRS with rales and rhonchi on exam concerning for aspiration pneumonia -  Cell count not obtained on paracentesis, but culture is currently negative at 3 days, so I  doubt this was the source -  Continue flagyl and ceftriaxone day 4 of 7 -  Speech therapy consultation for swallow eval:  Regular diet -  Blood cultures NGTD  Hyperglycemia -  A1c 5.5 -  D/c SSI   Alcohol abuse -  All alcohol levels have been less than threshold for last two years.    Hypotension, improved with midodrine  Protein-calorie malnutrition, severe  - currently on nutritional supplementation.  Nausea - provide antiemetics  Normocytic anemia likely due to recent blood loss/iron deficiency anemia and renal failure, hemoglobin trending down -  TSH wnl, b12 wnl, folate wnl -  Transfused 2 units PRBC on 9/8 -  Repeat iron studies c/w iron deficiency, start ferrous sulfate supplementation  Bipolar 1 d/o and constantly tearful, depressed, improved today -  Psychiatry assistance appreciated.    Diet:  Diabetic  Access:  PIV IVF:  albumin Proph:  SCDs  Code Status: DNR Family Communication: patient alone Disposition Plan:  To SNF on Monday with palliative to follow.  Since still massively volume overloaded and kidney function not back to baseline but trending down with current therapies, will continue lasix, midodrine, and octreotide through Sunday, then plan to transition to midodrine monotherapy and oral lasix on Monday.    Consultants:  IR  Palliative care  Nephrology  Procedures:  Paracentesis  Antibiotics:  Rocephin and Xifaxan  Flagyl added on 9/7  HPI/Subjective:  Thinking clearer and feeling better today because he slept well last night.  States his abdomen and legs feel better today.       Objective: Filed Vitals:   01/30/15 1440 01/30/15 2100 01/31/15  0517 01/31/15 0751  BP: 116/60 123/65 104/64 109/56  Pulse: 73 81 82 84  Temp: 98.1 F (36.7 C) 98.4 F (36.9 C) 98 F (36.7 C) 98.1 F (36.7 C)  TempSrc: Oral   Oral  Resp: 14 19 18 18   Height:      Weight:  109.77 kg (242 lb)    SpO2: 98% 97% 98% 96%    Intake/Output Summary  (Last 24 hours) at 01/31/15 1628 Last data filed at 01/31/15 1214  Gross per 24 hour  Intake    120 ml  Output   2226 ml  Net  -2106 ml   Filed Weights   01/29/15 0419 01/30/15 0500 01/30/15 2100  Weight: 118.7 kg (261 lb 11 oz) 118.117 kg (260 lb 6.4 oz) 109.77 kg (242 lb)   Body mass index is 33.77 kg/(m^2).  Exam:   General:  Adult male, No acute distress, speech is normal in tempo and he is able to follow conversation   HEENT:  NCAT, MMM  Cardiovascular:  RRR, nl S1, S2 2/6 systolic murmur, 2+ pulses, warm extremities  Respiratory:  CTAB anteriorly, no increased WOB  Abdomen:   NABS, soft, moderately distended, nontender  MSK:   Normal tone and bulk, massively volume overloaded on his legs, 3+ pitting edema in dependent areas, scrotum massively swollen  Neuro:   3/5 strength upper extremities and RLE, 4/5 LLE.    Psych:  Less tearful today  Data Reviewed: Basic Metabolic Panel:  Recent Labs Lab 01/27/15 0218 01/28/15 0246 01/29/15 0245 01/30/15 0348 01/31/15 0448  NA 131* 129* 128* 132* 131*  K 4.0 3.9 4.0 3.8 3.5  CL 104 100* 101 104 103  CO2 20* 19* 18* 19* 17*  GLUCOSE 127* 149* 135* 167* 163*  BUN 59* 62* 66* 68* 66*  CREATININE 2.49* 2.72* 2.86* 2.73* 2.45*  CALCIUM 8.1* 7.9* 7.9* 8.3* 8.0*  MG 1.7 1.7 1.8 1.8 1.8  PHOS  --   --  4.0 3.9 3.6   Liver Function Tests:  Recent Labs Lab 01/25/15 1607 01/26/15 0820 01/27/15 0218 01/28/15 0246 01/29/15 0245 01/30/15 0348 01/31/15 0448  AST 105* 88* 76* 74*  --   --   --   ALT 54 46 41 42  --   --   --   ALKPHOS 148* 132* 121 125  --   --   --   BILITOT 2.4* 2.3* 2.4* 1.6*  --   --   --   PROT 5.1* 4.6* 4.8* 4.8*  --   --   --   ALBUMIN 2.1* 1.8* 2.5* 2.3* 2.8* 3.2* 3.0*    Recent Labs Lab 01/25/15 1607  LIPASE 30    Recent Labs Lab 01/25/15 1623  AMMONIA 89*   CBC:  Recent Labs Lab 01/25/15 1607 01/26/15 0008 01/29/15 0245 01/30/15 0348 01/31/15 0448  WBC 5.7 5.7 5.2 5.5  5.3  NEUTROABS 4.1 4.1  --   --   --   HGB 8.3* 8.6* 6.2* 8.1* 8.7*  HCT 25.1* 25.3* 18.8* 23.6* 25.7*  MCV 91.9 92.0 93.1 90.1 92.1  PLT 51* 49* 47* 33* 54*    Recent Results (from the past 240 hour(s))  Blood culture (routine x 2)     Status: None   Collection Time: 01/25/15  4:08 PM  Result Value Ref Range Status   Specimen Description BLOOD RIGHT FOREARM  Final   Special Requests BOTTLES DRAWN AEROBIC AND ANAEROBIC 5CC  Final   Culture NO GROWTH 5  DAYS  Final   Report Status 01/30/2015 FINAL  Final  Blood culture (routine x 2)     Status: None   Collection Time: 01/25/15  4:23 PM  Result Value Ref Range Status   Specimen Description BLOOD LEFT HAND  Final   Special Requests BOTTLES DRAWN AEROBIC AND ANAEROBIC 5CC  Final   Culture NO GROWTH 5 DAYS  Final   Report Status 01/30/2015 FINAL  Final  Body fluid culture     Status: None   Collection Time: 01/25/15  9:27 PM  Result Value Ref Range Status   Specimen Description FLUID PERITONEAL  Final   Special Requests Normal  Final   Gram Stain   Final    FEW WBC PRESENT,BOTH PMN AND MONONUCLEAR NO ORGANISMS SEEN    Culture NO GROWTH 3 DAYS  Final   Report Status 01/29/2015 FINAL  Final  Urine culture     Status: None   Collection Time: 01/25/15 11:00 PM  Result Value Ref Range Status   Specimen Description URINE, CATHETERIZED  Final   Special Requests NONE  Final   Culture NO GROWTH 1 DAY  Final   Report Status 01/27/2015 FINAL  Final  MRSA PCR Screening     Status: Abnormal   Collection Time: 01/25/15 11:00 PM  Result Value Ref Range Status   MRSA by PCR POSITIVE (A) NEGATIVE Final    Comment:        The GeneXpert MRSA Assay (FDA approved for NASAL specimens only), is one component of a comprehensive MRSA colonization surveillance program. It is not intended to diagnose MRSA infection nor to guide or monitor treatment for MRSA infections. RESULT CALLED TO, READ BACK BY AND VERIFIED WITH: RN ANN DILLARD [id number]  @[phone]  THANEY      Studies: Ct Head Wo Contrast  01/30/2015   CLINICAL DATA:  History of alcoholic liver cirrhosis. Abdominal discomfort. History of hepatic encephalopathy and falls. History of GI bleeding. Worse headache of life. 01/17/2015  EXAM: CT HEAD WITHOUT CONTRAST  TECHNIQUE: Contiguous axial images were obtained from the base of the skull through the vertex without intravenous contrast.  COMPARISON:  None.  FINDINGS: There is mild central and cortical atrophy. Periventricular white matter changes are consistent with small vessel disease. There is no intra or extra-axial fluid collection or mass lesion. The basilar cisterns and ventricles have a normal appearance. There is no CT evidence for acute infarction or hemorrhage. Bone windows show no acute findings.  There is patient motion artifact, degrading image quality.  IMPRESSION: 1. Atrophy and small vessel disease. 2.  No evidence for acute intracranial abnormality.   Electronically Signed   By: Nolon Nations M.D.   On: 01/30/2015 12:44    Scheduled Meds: . sodium chloride   Intravenous Once  . amitriptyline  10 mg Oral QHS  . cefTRIAXone (ROCEPHIN)  IV  1 g Intravenous Q24H  . collagenase   Topical Daily  . feeding supplement  1 Container Oral TID BM  . feeding supplement (PRO-STAT SUGAR FREE 64)  30 mL Oral BID  . ferrous sulfate  325 mg Oral BID WC  . folic acid  1 mg Oral Daily  . furosemide  80 mg Intravenous 3 times per day  . hydrOXYzine  50 mg Oral QHS  . insulin aspart  0-9 Units Subcutaneous TID WC  . lactulose  30 g Oral TID  . lactulose  300 mL Rectal BID  . magnesium oxide  400 mg Oral BID  .  metronidazole  500 mg Intravenous Q8H  . midodrine  10 mg Oral TID WC  . octreotide  100 mcg Subcutaneous 3 times per day  . pantoprazole  40 mg Oral Daily  . rifaximin  550 mg Oral BID  . sodium chloride  500 mL Intravenous Once  . thiamine  100 mg Oral Daily  . venlafaxine  25 mg Oral BID WC   Continuous Infusions:    Principal Problem:   Bipolar affect, depressed Active Problems:   Alcohol abuse   HTN (hypertension)   Thrombocytopenia   GI bleed   Depression, major, recurrent, moderate   MDD (major depressive disorder)   Alcoholic cirrhosis of liver with ascites   Chronic diastolic heart failure   Protein-calorie malnutrition, severe   Sepsis   Anasarca   ALC (alcoholic liver cirrhosis)   Palliative care encounter   Headache    Time spent: 30 min    Nicie Milan, [facility] Pager [phone]. If 7PM-7AM, please contact night-coverage at [email or web address], password [facility] 01/31/2015, 4:28 PM  LOS: 6 days

## 2015-02-01 LAB — RENAL FUNCTION PANEL
ALBUMIN: 2.7 g/dL — AB (ref 3.5–5.0)
Anion gap: 8 (ref 5–15)
BUN: 65 mg/dL — AB (ref 6–20)
CO2: 21 mmol/L — ABNORMAL LOW (ref 22–32)
Calcium: 8 mg/dL — ABNORMAL LOW (ref 8.9–10.3)
Chloride: 104 mmol/L (ref 101–111)
Creatinine, Ser: 2.25 mg/dL — ABNORMAL HIGH (ref 0.61–1.24)
GFR calc Af Amer: 35 mL/min — ABNORMAL LOW (ref >60)
GFR calc non Af Amer: 30 mL/min — ABNORMAL LOW (ref >60)
GLUCOSE: 176 mg/dL — AB (ref 65–99)
PHOSPHORUS: 3.7 mg/dL (ref 2.5–4.6)
POTASSIUM: 3.1 mmol/L — AB (ref 3.5–5.1)
SODIUM: 133 mmol/L — AB (ref 135–145)

## 2015-02-01 LAB — CBC
HCT: 25.2 % — ABNORMAL LOW (ref 39.0–52.0)
HEMOGLOBIN: 8.5 g/dL — AB (ref 13.0–17.0)
MCH: 31.1 pg (ref 26.0–34.0)
MCHC: 33.7 g/dL (ref 30.0–36.0)
MCV: 92.3 fL (ref 78.0–100.0)
Platelets: 45 10*3/uL — ABNORMAL LOW (ref 150–400)
RBC: 2.73 MIL/uL — AB (ref 4.22–5.81)
RDW: 20.3 % — ABNORMAL HIGH (ref 11.5–15.5)
WBC: 5.4 10*3/uL (ref 4.0–10.5)

## 2015-02-01 LAB — PROTIME-INR
INR: 2.41 — AB (ref 0.00–1.49)
PROTHROMBIN TIME: 26 s — AB (ref 11.6–15.2)

## 2015-02-01 LAB — MAGNESIUM: Magnesium: 1.7 mg/dL (ref 1.7–2.4)

## 2015-02-01 MED ORDER — POTASSIUM CHLORIDE CRYS ER 20 MEQ PO TBCR
30.0000 meq | EXTENDED_RELEASE_TABLET | Freq: Two times a day (BID) | ORAL | Status: DC
  Administered 2015-02-01 – 2015-02-02 (×3): 30 meq via ORAL
  Filled 2015-02-01 (×3): qty 2

## 2015-02-01 MED ORDER — MIDODRINE HCL 10 MG PO TABS: 10.0000 mg | ORAL_TABLET | Freq: Three times a day (TID) | ORAL | Status: AC

## 2015-02-01 MED ORDER — FERROUS SULFATE 325 (65 FE) MG PO TABS: 325.0000 mg | ORAL_TABLET | Freq: Two times a day (BID) | ORAL | Status: AC

## 2015-02-01 MED ORDER — AMITRIPTYLINE HCL 10 MG PO TABS: 10.0000 mg | ORAL_TABLET | Freq: Every day | ORAL | Status: AC

## 2015-02-01 MED ORDER — OXYCODONE HCL 5 MG PO TABS: 5.0000 mg | ORAL_TABLET | Freq: Four times a day (QID) | ORAL | Status: AC | PRN

## 2015-02-01 MED ORDER — ONDANSETRON 4 MG PO TBDP: 4.0000 mg | ORAL_TABLET | Freq: Three times a day (TID) | ORAL | Status: AC | PRN

## 2015-02-01 MED ORDER — FUROSEMIDE 80 MG PO TABS: 80.0000 mg | ORAL_TABLET | Freq: Two times a day (BID) | ORAL | Status: AC

## 2015-02-01 MED ORDER — LACTULOSE 10 GM/15ML PO SOLN
30.0000 g | Freq: Two times a day (BID) | ORAL | Status: DC
  Administered 2015-02-01 – 2015-02-02 (×3): 30 g via ORAL
  Filled 2015-02-01 (×3): qty 45

## 2015-02-01 MED ORDER — METRONIDAZOLE 500 MG PO TABS
500.0000 mg | ORAL_TABLET | Freq: Three times a day (TID) | ORAL | Status: DC
  Administered 2015-02-01 – 2015-02-02 (×4): 500 mg via ORAL
  Filled 2015-02-01 (×4): qty 1

## 2015-02-01 MED ORDER — LACTULOSE 10 GM/15ML PO SOLN: 30.0000 g | Freq: Two times a day (BID) | ORAL | Status: AC

## 2015-02-01 MED ORDER — VENLAFAXINE HCL 25 MG PO TABS: 25.0000 mg | ORAL_TABLET | Freq: Two times a day (BID) | ORAL | Status: DC

## 2015-02-01 MED ORDER — LACTULOSE 10 GM/15ML PO SOLN: 20.0000 g | Freq: Three times a day (TID) | ORAL | Status: DC

## 2015-02-01 MED ORDER — HYDROXYZINE HCL 50 MG PO TABS: 50.0000 mg | ORAL_TABLET | Freq: Every day | ORAL | Status: AC

## 2015-02-01 MED ORDER — POTASSIUM CHLORIDE CRYS ER 20 MEQ PO TBCR: 20.0000 meq | EXTENDED_RELEASE_TABLET | Freq: Every day | ORAL | Status: AC

## 2015-02-01 MED ORDER — DOXYCYCLINE HYCLATE 100 MG PO TABS: 100.0000 mg | ORAL_TABLET | Freq: Two times a day (BID) | ORAL | Status: AC

## 2015-02-01 NOTE — Discharge Summary (Signed)
Physician Discharge Summary  [name] ENI:[id number] DOB: [birth year] DOA: 01/25/2015  PCP: Barbette Merino, MD  Admit date: 01/25/2015 Discharge date:  pending  Recommendations for Outpatient Follow-up:  1. Transfer to SNF for ongoing PT/OT 2. Palliative care to follow 3. Repeat CBC and BMP with magnesium in 1 week to follow-up anemia, thrombocytopenia, hyponatremia, hypokalemia, creatinine  Discharge Diagnoses:  Principal Problem:   AKI (acute kidney injury) Active Problems:   Alcohol abuse   HTN (hypertension)   Thrombocytopenia   Bipolar affect, depressed   GI bleed   Depression, major, recurrent, moderate   MDD (major depressive disorder)   Alcoholic cirrhosis of liver with ascites   Encephalopathy   Chronic diastolic heart failure   Protein-calorie malnutrition, severe   Sepsis   Anasarca   ALC (alcoholic liver cirrhosis)   Palliative care encounter   Headache   Discharge Condition: stable, improved  Diet recommendation: Low-sodium  Wt Readings from Last 3 Encounters:  01/31/15 109.77 kg (242 lb)  01/22/15 121.927 kg (268 lb 12.8 oz)  01/14/15 118.842 kg (262 lb)    History of present illness:  Patient is a 59 year old with history of alcoholic liver cirrhosis who presented with abdominal discomfort and SIRs. He has had 9 admissions in the last 6 months and 5 ER visits. Most admissions have been for hepatic encephalopathy and falls, however, in July he had GIB. On several admissions, he required intubation for his encephalopathy. He has been noncompliant with his medications, including diuretics. He continues to require large volume paracenteses, sometimes every few days most commonly from medication noncompliance. He has continued to use cocaine and is on narcotics for chronic pain and clonazepam for "bipolar" all of which likely contribute to his encephalopathy. He presented for admission on 9/4 secondary to increased abdominal distention and lower extremity  swelling. He also had some hepatic encephalopathy.  Hospital Course:   Alcoholic liver cirrhosis with portal hypertensive gastropathy, hx of GIB, recurrent hepatic encephalopathy, currently with confusion and slowed mentation consistent with hepatic encephalopathy. MELD ([facility] and UNOS) 25. All alcohol levels have been less than threshold for last two years, however he has had positive UDS for cocaine on 08/29/2014.  Perhaps he will remain abstinent now that he is entering SNF for ongoing care.  He was started on rifaximin and lactulose and his mentation improved, however he continues to have some mild low-grade confusion at times. He is feeling that with palliative care who assisted with goals of care. He is currently DO NOT RESUSCITATE and we recommend palliative care follow. He is a candidate for hospice care, and should he have a decline in his health, would recommend transitioning to inpatient hospice facility if he and family are amenable.  Acute on chronic kidney disease stage III, likely due to ATN from hypotension, but concerning for hepatorenal syndrome. RUS demonstrated normal appearing kidneys with no hydronephrosis or signs of obstruction.  FENa 0.15% and UA with granular casts.  He was started on octreotide, midodrine, and albumin. Nephrology was consulted. His urine output increased and his creatinine trended down to almost baseline.  He should continue midodrine, however his octreotide has been stopped. He should continue a low-sodium diet.  Headache.  He developed a severe headache during his hospitalization. He had a head CT which demonstrated no evidence of hemorrhage. He had not had any falls or head trauma but he does have chronic thrombocytopenia secondary to his liver disease. His headache improved with oxycodone and Compazine.   Anasarca and hyponatremia  due to cirrhosis. He was diuresed with IV Lasix 80 mg 3 times a day under the supervision of nephrology. He will be transitioned  to Lasix 80 mg by mouth 3 times a day at the time of discharge. He should have daily weights and if he gains more than 3 pounds in 1 day or 5 pounds over 7 days, the supervising physician should be notified.  He underwent one paracentesis during this hospitalization.    Sepsis with rales and rhonchi on exam concerning for aspiration pneumonia.  His paracentesis fluid and urine culture were both negative.  He was treated for aspiration pneumonia with improvement in his cough and shortness of breath. He was seen by speech therapy who suggested a regular diet. Most likely he had some aspiration during his period of hepatic encephalopathy and that his mentation is improved to swallowing is now more reliable. His blood cultures are no growth to date.  He should continue doxycycline through 9/13, then stop.  Hyperglycemia, likely stress reaction from sepsis, and persisted, however his hemoglobin A1c was 5.5. Recommend repeat hemoglobin A1c in 3 months.  Alcohol abuse, All alcohol levels have been less than threshold for last two years.   Hypotension, improved with midodrine.  Protein-calorie malnutrition, severe.  He was seen by nutrition and started on supplementation.  Nausea, improved with antiemetics  Normocytic anemia likely due to recent blood loss/iron deficiency anemia and renal failure, hemoglobin stabilized at near a 0.5 mg/dL.   TSH wnl, b12 wnl, folate wnl. His iron studies were consistent with iron deficiency and he was started on ferrous sulfate supplementation. He was transfused 2 units PRBC on 9/8.    Bipolar 1 d/o and frequently tearful, depressed.  He was seen by psychiatry who started him on Effexor and hydroxyzine.    Consultants:  IR  Palliative care  Nephrology  Procedures:  Paracentesis  Antibiotics:  Rocephin and Xifaxan  Flagyl added on 9/7  Discharge Exam: Filed Vitals:   02/01/15 0916  BP: 121/75  Pulse: 77  Temp: 97.8 F (36.6 C)  Resp: 19   Filed  Vitals:   01/31/15 1802 01/31/15 2038 02/01/15 0647 02/01/15 0916  BP: 127/61 112/62 112/70 121/75  Pulse: 83 104 79 77  Temp: 98.3 F (36.8 C) 97.9 F (36.6 C) 98.7 F (37.1 C) 97.8 F (36.6 C)  TempSrc: Oral   Oral  Resp: 18 18 17 19   Height:      Weight:  109.77 kg (242 lb)    SpO2: 98% 100% 99% 98%     General: Adult male, No acute distress, speech is normal in tempo but he remains mildly confused  HEENT: NCAT, MMM  Cardiovascular: RRR, nl S1, S2 2/6 systolic murmur, 2+ pulses, warm extremities  Respiratory: CTAB anteriorly, no increased WOB  Abdomen: NABS, soft, moderately distended, nontender  MSK: Normal tone and bulk, massively volume overloaded on his legs, 3+ pitting edema in dependent areas, scrotum swollen  Neuro: Cranial nerves II through XII grossly intact, 4/5 strength throughout   Discharge Instructions      Discharge Instructions    (HEART FAILURE PATIENTS) Call MD:  Anytime you have any of the following symptoms: 1) 3 pound weight gain in 24 hours or 5 pounds in 1 week 2) shortness of breath, with or without a dry hacking cough 3) swelling in the hands, feet or stomach 4) if you have to sleep on extra pillows at night in order to breathe.    Complete by:  As  directed      Call MD for:  difficulty breathing, headache or visual disturbances    Complete by:  As directed      Call MD for:  extreme fatigue    Complete by:  As directed      Call MD for:  hives    Complete by:  As directed      Call MD for:  persistant dizziness or light-headedness    Complete by:  As directed      Call MD for:  persistant nausea and vomiting    Complete by:  As directed      Call MD for:  severe uncontrolled pain    Complete by:  As directed      Call MD for:  temperature >100.4    Complete by:  As directed      Diet - low sodium heart healthy    Complete by:  As directed      Increase activity slowly    Complete by:  As directed              Medication List    STOP taking these medications        albuterol (2.5 MG/3ML) 0.083% nebulizer solution  Commonly known as:  PROVENTIL     CALCIUM-VITAMIN D PO     clonazePAM 0.25 MG disintegrating tablet  Commonly known as:  KLONOPIN     methocarbamol 500 MG tablet  Commonly known as:  ROBAXIN     NOVOLOG FLEXPEN 100 UNIT/ML FlexPen  Generic drug:  insulin aspart     propranolol 20 MG tablet  Commonly known as:  INDERAL     spironolactone 50 MG tablet  Commonly known as:  ALDACTONE      TAKE these medications        amitriptyline 10 MG tablet  Commonly known as:  ELAVIL  Take 1 tablet (10 mg total) by mouth at bedtime.     collagenase ointment  Commonly known as:  SANTYL  Apply topically daily.     diclofenac sodium 1 % Gel  Commonly known as:  VOLTAREN  Apply 4 g topically as needed (for back of leg pain).     doxycycline 100 MG tablet  Commonly known as:  VIBRA-TABS  Take 1 tablet (100 mg total) by mouth 2 (two) times daily.     feeding supplement (PRO-STAT SUGAR FREE 64) Liqd  Take 30 mLs by mouth 2 (two) times daily.     feeding supplement Liqd  Take 1 Container by mouth 3 (three) times daily between meals.     ferrous sulfate 325 (65 FE) MG tablet  Take 1 tablet (325 mg total) by mouth 2 (two) times daily with a meal.     folic acid 1 MG tablet  Commonly known as:  FOLVITE  Take 1 tablet (1 mg total) by mouth daily.     furosemide 80 MG tablet  Commonly known as:  LASIX  Take 1 tablet (80 mg total) by mouth 2 (two) times daily.     hydrOXYzine 50 MG tablet  Commonly known as:  ATARAX/VISTARIL  Take 1 tablet (50 mg total) by mouth at bedtime.     lactulose 10 GM/15ML solution  Commonly known as:  CHRONULAC  Take 45 mLs (30 g total) by mouth 2 (two) times daily.     magnesium oxide 400 (241.3 MG) MG tablet  Commonly known as:  MAG-OX  Take 1 tablet (400 mg total) by mouth  2 (two) times daily.     midodrine 10 MG tablet  Commonly known  as:  PROAMATINE  Take 1 tablet (10 mg total) by mouth 3 (three) times daily with meals.     multivitamin with minerals Tabs tablet  Take 1 tablet by mouth daily.     ondansetron 4 MG disintegrating tablet  Commonly known as:  ZOFRAN-ODT  Take 1 tablet (4 mg total) by mouth every 8 (eight) hours as needed for nausea or vomiting.     oxyCODONE 5 MG immediate release tablet  Commonly known as:  Oxy IR/ROXICODONE  Take 1 tablet (5 mg total) by mouth every 6 (six) hours as needed for moderate pain or severe pain.     pantoprazole 40 MG tablet  Commonly known as:  PROTONIX  Take 1 tablet (40 mg total) by mouth daily.     potassium chloride SA 20 MEQ tablet  Commonly known as:  K-DUR,KLOR-CON  Take 1 tablet (20 mEq total) by mouth daily.     rifaximin 550 MG Tabs tablet  Commonly known as:  XIFAXAN  Take 1 tablet (550 mg total) by mouth 2 (two) times daily.     thiamine 100 MG tablet  Commonly known as:  VITAMIN B-1  Take 100 mg by mouth daily.     venlafaxine 25 MG tablet  Commonly known as:  EFFEXOR  Take 1 tablet (25 mg total) by mouth 2 (two) times daily with a meal.       Follow-up Information    Follow up with GARBA,LAWAL, MD. Schedule an appointment as soon as possible for a visit in 1 week.   Specialty:  Internal Medicine   Contact information:   [place] [place] [id number] [phone]        The results of significant diagnostics from this hospitalization (including imaging, microbiology, ancillary and laboratory) are listed below for reference.    Significant Diagnostic Studies: Dg Chest 2 View  01/14/2015   CLINICAL DATA:  Initial evaluation for acute left-sided chest pain.  EXAM: CHEST  2 VIEW  COMPARISON:  Prior radiograph from 01/01/2015.  FINDINGS: Cardiomegaly is stable. Mediastinal silhouette within normal limits.  The lungs are normally inflated. No airspace consolidation, pleural effusion, or pulmonary edema is identified. There is no  pneumothorax.  No acute osseous abnormality identified.  IMPRESSION: 1. No active cardiopulmonary disease. 2. Stable cardiomegaly.   Electronically Signed   By: Jeannine Boga M.D.   On: 01/14/2015 23:45   Ct Head Wo Contrast  01/30/2015   CLINICAL DATA:  History of alcoholic liver cirrhosis. Abdominal discomfort. History of hepatic encephalopathy and falls. History of GI bleeding. Worse headache of life. 01/17/2015  EXAM: CT HEAD WITHOUT CONTRAST  TECHNIQUE: Contiguous axial images were obtained from the base of the skull through the vertex without intravenous contrast.  COMPARISON:  None.  FINDINGS: There is mild central and cortical atrophy. Periventricular white matter changes are consistent with small vessel disease. There is no intra or extra-axial fluid collection or mass lesion. The basilar cisterns and ventricles have a normal appearance. There is no CT evidence for acute infarction or hemorrhage. Bone windows show no acute findings.  There is patient motion artifact, degrading image quality.  IMPRESSION: 1. Atrophy and small vessel disease. 2.  No evidence for acute intracranial abnormality.   Electronically Signed   By: Nolon Nations M.D.   On: 01/30/2015 12:44   Ct Head Wo Contrast  01/17/2015   CLINICAL DATA:  59 year old  male found unconscious on floor. Altered mental status.  EXAM: CT HEAD WITHOUT CONTRAST  TECHNIQUE: Contiguous axial images were obtained from the base of the skull through the vertex without intravenous contrast.  COMPARISON:  Head CT 12/2014.  FINDINGS: Mild cerebral atrophy. Patchy and confluent areas of decreased attenuation are noted throughout the deep and periventricular white matter of the cerebral hemispheres bilaterally, compatible with chronic microvascular ischemic disease. No acute intracranial abnormalities. Specifically, no evidence of acute intracranial hemorrhage, no definite findings of acute/subacute cerebral ischemia, no mass, mass effect,  hydrocephalus or abnormal intra or extra-axial fluid collections. Visualized paranasal sinuses and mastoids are well pneumatized. No acute displaced skull fractures are identified.  IMPRESSION: 1. No acute intracranial abnormalities. 2. Mild cerebral atrophy with mild chronic microvascular ischemic changes in the cerebral white matter.   Electronically Signed   By: Vinnie Langton M.D.   On: 01/17/2015 14:30   US Renal  01/28/2015   CLINICAL DATA:  Worsening renal function. Chronic renal disease. History of cirrhosis.  EXAM: RENAL / URINARY TRACT ULTRASOUND COMPLETE  COMPARISON:  None.  FINDINGS: Right Kidney:  Length: 12.1 cm. Echogenicity within normal limits. No mass or hydronephrosis visualized.  Left Kidney:  Length: 12.4 cm. Echogenicity within normal limits. No mass or hydronephrosis visualized.  Bladder:  Appears normal for degree of bladder distention. Foley catheter is in place. A small volume of pelvic ascites is identified.  IMPRESSION: Negative for hydronephrosis.  Normal appearing kidneys.  Small volume of pelvic ascites.   Electronically Signed   By: Inge Rise M.D.   On: 01/28/2015 19:50   US Paracentesis  01/27/2015   CLINICAL DATA:  Ascites  EXAM: ULTRASOUND GUIDED PARACENTESIS  TECHNIQUE: Survey ultrasound of the abdomen was performed and an appropriate skin entry site in the right lower abdomen was selected. Skin site was marked, prepped with Betadine, and draped in usual sterile fashion, and infiltrated locally with 1% lidocaine. A 5 French multisidehole Yueh sheath needle was advanced into the peritoneal space until fluid could be aspirated. The sheath was advanced and the needle removed. Four L of clearascites were aspirated.  COMPLICATIONS: None  IMPRESSION: Technically successful ultrasound guided paracentesis, removing 4 L of ascites.   Electronically Signed   By: Marybelle Killings M.D.   On: 01/27/2015 11:18   Dg Chest Portable 1 View  01/25/2015   CLINICAL DATA:  Abdominal pain  with chronic ascites.  EXAM: PORTABLE CHEST - 1 VIEW  COMPARISON:  01/20/2015  FINDINGS: The heart size and mediastinal contours are within normal limits. Both lungs are clear. The visualized skeletal structures are unremarkable.  IMPRESSION: No active disease.   Electronically Signed   By: Kathreen Devoid   On: 01/25/2015 16:09   Dg Chest Port 1 View  01/20/2015   CLINICAL DATA:  Chest pain. History of hypertension, diabetes and COPD.  EXAM: PORTABLE CHEST - 1 VIEW  COMPARISON:  01/17/2015 and 01/14/2015.  FINDINGS: 1220 hours. The heart size and mediastinal contours are stable. There are persistent low lung volumes with asymmetric right basilar atelectasis. Overall pulmonary aeration has improved. No edema or confluent airspace opacity identified. There is no pleural effusion or pneumothorax. Telemetry leads overlie the chest.  IMPRESSION: Mild overall improvement in pulmonary aeration with mild right lower lobe atelectasis. No consolidation.   Electronically Signed   By: Richardean Sale M.D.   On: 01/20/2015 13:17   Dg Chest Port 1 View  01/17/2015   CLINICAL DATA:  BILATERAL lower extremity swelling  for 2-3 days, LEFT rib pain, shortness of breath, hypertension, diabetes mellitus, COPD, smoker  EXAM: PORTABLE CHEST - 1 VIEW  COMPARISON:  Portable exam 1152 hours compared to 01/14/2015  FINDINGS: Upper normal size of cardiac silhouette.  Rotated to the LEFT.  Mediastinal contours normal.  Coarsened interstitial markings in the mid to lower lungs question infiltrate.  No pleural effusion or pneumothorax.  Bones unremarkable.  IMPRESSION: Coarsened interstitial markings in the mid to lower lungs new since previous exam question interstitial infiltrates which could represent edema or infection.   Electronically Signed   By: Lavonia Dana M.D.   On: 01/17/2015 12:28    Microbiology: Recent Results (from the past 240 hour(s))  Blood culture (routine x 2)     Status: None   Collection Time: 01/25/15  4:08 PM   Result Value Ref Range Status   Specimen Description BLOOD RIGHT FOREARM  Final   Special Requests BOTTLES DRAWN AEROBIC AND ANAEROBIC 5CC  Final   Culture NO GROWTH 5 DAYS  Final   Report Status 01/30/2015 FINAL  Final  Blood culture (routine x 2)     Status: None   Collection Time: 01/25/15  4:23 PM  Result Value Ref Range Status   Specimen Description BLOOD LEFT HAND  Final   Special Requests BOTTLES DRAWN AEROBIC AND ANAEROBIC 5CC  Final   Culture NO GROWTH 5 DAYS  Final   Report Status 01/30/2015 FINAL  Final  Body fluid culture     Status: None   Collection Time: 01/25/15  9:27 PM  Result Value Ref Range Status   Specimen Description FLUID PERITONEAL  Final   Special Requests Normal  Final   Gram Stain   Final    FEW WBC PRESENT,BOTH PMN AND MONONUCLEAR NO ORGANISMS SEEN    Culture NO GROWTH 3 DAYS  Final   Report Status 01/29/2015 FINAL  Final  Urine culture     Status: None   Collection Time: 01/25/15 11:00 PM  Result Value Ref Range Status   Specimen Description URINE, CATHETERIZED  Final   Special Requests NONE  Final   Culture NO GROWTH 1 DAY  Final   Report Status 01/27/2015 FINAL  Final  MRSA PCR Screening     Status: Abnormal   Collection Time: 01/25/15 11:00 PM  Result Value Ref Range Status   MRSA by PCR POSITIVE (A) NEGATIVE Final    Comment:        The GeneXpert MRSA Assay (FDA approved for NASAL specimens only), is one component of a comprehensive MRSA colonization surveillance program. It is not intended to diagnose MRSA infection nor to guide or monitor treatment for MRSA infections. RESULT CALLED TO, READ BACK BY AND VERIFIED WITH: RN ANN DILLARD [id number] @0045  THANEY      Labs: Basic Metabolic Panel:  Recent Labs Lab 01/28/15 0246 01/29/15 0245 01/30/15 0348 01/31/15 0448 02/01/15 0450  NA 129* 128* 132* 131* 133*  K 3.9 4.0 3.8 3.5 3.1*  CL 100* 101 104 103 104  CO2 19* 18* 19* 17* 21*  GLUCOSE 149* 135* 167* 163* 176*  BUN 62*  66* 68* 66* 65*  CREATININE 2.72* 2.86* 2.73* 2.45* 2.25*  CALCIUM 7.9* 7.9* 8.3* 8.0* 8.0*  MG 1.7 1.8 1.8 1.8 1.7  PHOS  --  4.0 3.9 3.6 3.7   Liver Function Tests:  Recent Labs Lab 01/25/15 1607 01/26/15 0820 01/27/15 0218 01/28/15 0246 01/29/15 0245 01/30/15 0348 01/31/15 0448 02/01/15 0450  AST 105* 88* 76*  74*  --   --   --   --   ALT 54 46 41 42  --   --   --   --   ALKPHOS 148* 132* 121 125  --   --   --   --   BILITOT 2.4* 2.3* 2.4* 1.6*  --   --   --   --   PROT 5.1* 4.6* 4.8* 4.8*  --   --   --   --   ALBUMIN 2.1* 1.8* 2.5* 2.3* 2.8* 3.2* 3.0* 2.7*    Recent Labs Lab 01/25/15 1607  LIPASE 30    Recent Labs Lab 01/25/15 1623  AMMONIA 89*   CBC:  Recent Labs Lab 01/25/15 1607 01/26/15 0008 01/29/15 0245 01/30/15 0348 01/31/15 0448 02/01/15 0450  WBC 5.7 5.7 5.2 5.5 5.3 5.4  NEUTROABS 4.1 4.1  --   --   --   --   HGB 8.3* 8.6* 6.2* 8.1* 8.7* 8.5*  HCT 25.1* 25.3* 18.8* 23.6* 25.7* 25.2*  MCV 91.9 92.0 93.1 90.1 92.1 92.3  PLT 51* 49* 47* 33* 54* 45*   Cardiac Enzymes: No results for input(s): CKTOTAL, CKMB, CKMBINDEX, TROPONINI in the last 168 hours. BNP: BNP (last 3 results)  Recent Labs  11/29/14 1145 01/15/15 0010 01/17/15 1815  BNP 29.4 46.4 75.3    ProBNP (last 3 results)  Recent Labs  02/08/14 1830 02/10/14 1547  PROBNP 966.3* 805.5*    CBG:  Recent Labs Lab 01/30/15 1703 01/30/15 2112 01/31/15 0746 01/31/15 1200 01/31/15 2026  GLUCAP 163* 175* 155* 213* 168*    Time coordinating discharge: 35 minutes  Signed:  Alorah Mcree  [facility] 02/01/2015, 12:33 PM

## 2015-02-01 NOTE — Clinical Social Work Note (Signed)
Clinical Social Work Assessment  Patient Details  Name: [name] MRN: [id number] Date of Birth: [birth year]  Date of referral:  02/01/15               Reason for consult:  Facility Placement                Permission sought to share information with:  Chartered certified accountant granted to share information::  Yes, Verbal Permission Granted (Granted by pt friend Constance Holster)  Name::        Agency::     Relationship::     Contact Information:     Housing/Transportation Living arrangements for the past 2 months:  Single Family Home Source of Information:  Friend/Neighbor Patient Interpreter Needed:  None Criminal Activity/Legal Involvement Pertinent to Current Situation/Hospitalization:  No - Comment as needed Significant Relationships:  Adult Children, Friend Lives with:  Self Do you feel safe going back to the place where you live?  No Need for family participation in patient care:  No (Coment)  Care giving concerns:  Pt requiring SNF placement    Social Worker assessment / plan:  CSW attempted to speak with pt multiple times but pt receiving care during [place] visits. CSW familiar with pt from previous admission and from notes this admission. CSW called pt friend Constance Holster as CSW is aware this is pt friend/caregiver and hospital staff have been in communication with her about dc planning. Constance Holster confirmed she would like pt to dc to [facility], specifically [place] where pt has been before. She is aware of potential barriers from insurance. She has agreed for referral to be sent to all [facility]. She did inform CSW that she applied for special assistance Medicaid for ALF for pt two weeks ago and it is pending pt signature. CSW will notify facilities of this information.  Employment status:    Insurance informationEducational psychologist PT Recommendations:  Welcome / Referral to community resources:  [place]  Patient/Family's Response to care:  Pt friend in agreement with plan and states pt is as well.  Patient/Family's Understanding of and Emotional Response to Diagnosis, Current Treatment, and Prognosis:  Pt friend has good insight on pt condition. Pt friend with appropriate emotional response.  Emotional Assessment Appearance:  Appears stated age Attitude/Demeanor/Rapport:  Unable to Assess Affect (typically observed):  Unable to Assess Orientation:  Oriented to Self, Oriented to Place, Oriented to  Time, Oriented to Situation Alcohol / Substance use:  Not Applicable Psych involvement (Current and /or in the community):  Yes (Comment)  Discharge Needs  Concerns to be addressed:  Discharge Planning Concerns Readmission within the last 30 days:    Current discharge risk:  Dependent with Mobility, Chronically ill Barriers to Discharge:  Continued Medical Work up   [employer], [employer] Weekend CSW [phone]

## 2015-02-01 NOTE — Progress Notes (Signed)
Foley catheter removed per MD order.  Patient tolerated well.  

## 2015-02-02 LAB — TYPE AND SCREEN
ABO/RH(D): O POS
Antibody Screen: NEGATIVE
Unit division: 0
Unit division: 0
Unit division: 0

## 2015-02-02 LAB — CBC
HEMATOCRIT: 26.4 % — AB (ref 39.0–52.0)
HEMOGLOBIN: 8.7 g/dL — AB (ref 13.0–17.0)
MCH: 30.6 pg (ref 26.0–34.0)
MCHC: 33 g/dL (ref 30.0–36.0)
MCV: 93 fL (ref 78.0–100.0)
Platelets: 44 10*3/uL — ABNORMAL LOW (ref 150–400)
RBC: 2.84 MIL/uL — AB (ref 4.22–5.81)
RDW: 20.5 % — ABNORMAL HIGH (ref 11.5–15.5)
WBC: 5.4 10*3/uL (ref 4.0–10.5)

## 2015-02-02 LAB — PROTIME-INR
INR: 2.47 — ABNORMAL HIGH (ref 0.00–1.49)
Prothrombin Time: 26.4 seconds — ABNORMAL HIGH (ref 11.6–15.2)

## 2015-02-02 LAB — RENAL FUNCTION PANEL
ALBUMIN: 2.7 g/dL — AB (ref 3.5–5.0)
ANION GAP: 8 (ref 5–15)
BUN: 65 mg/dL — ABNORMAL HIGH (ref 6–20)
CALCIUM: 7.8 mg/dL — AB (ref 8.9–10.3)
CO2: 21 mmol/L — AB (ref 22–32)
CREATININE: 2.2 mg/dL — AB (ref 0.61–1.24)
Chloride: 103 mmol/L (ref 101–111)
GFR, EST AFRICAN AMERICAN: 36 mL/min — AB (ref >60)
GFR, EST NON AFRICAN AMERICAN: 31 mL/min — AB (ref >60)
Glucose, Bld: 231 mg/dL — ABNORMAL HIGH (ref 65–99)
PHOSPHORUS: 3.5 mg/dL (ref 2.5–4.6)
Potassium: 3.1 mmol/L — ABNORMAL LOW (ref 3.5–5.1)
SODIUM: 132 mmol/L — AB (ref 135–145)

## 2015-02-02 LAB — MAGNESIUM: MAGNESIUM: 1.7 mg/dL (ref 1.7–2.4)

## 2015-02-02 MED ORDER — VENLAFAXINE HCL 37.5 MG PO TABS: 37.5000 mg | ORAL_TABLET | Freq: Two times a day (BID) | ORAL | Status: AC

## 2015-02-02 MED ORDER — VENLAFAXINE HCL 37.5 MG PO TABS
37.5000 mg | ORAL_TABLET | Freq: Two times a day (BID) | ORAL | Status: DC
  Filled 2015-02-02 (×2): qty 1

## 2015-02-02 NOTE — Progress Notes (Signed)
Daily Progress Note   Patient Name: [name]       Date: 02/02/2015 DOB: [birth year]  Age: 59 y.o. MRN#: [id number] Attending Physician: No att. providers found Primary Care Physician: Barbette Merino, MD Admit Date: 01/25/2015  Reason for Consultation/Follow-up: Establishing goals of care  Subjective: I met with [name] today. He reports he is feeling much better and excited to transition to nursing facility. He reports that he thinks he is going to continue to improve. He still remains unable to fully engage in conversation regarding how sick he is with his chronic medical problems. He asked me to call to speak with his longtime girlfriend, Constance Holster.  Constance Holster and I again discussed that the hospital can be useful as long as [name] is getting well enough from care he receives at the hospital to enjoy his time at home, but has reached the point where, if his goal is to be at home, he may be better served to plan on being at home and bringing care to him at home rather repeated trips to the hospital. We discussed hospice as a tool that may be beneficial in this goal as he has now reached a point where we are trying to fix problems that are not fixable.  She is in agreement that a good plan would be to go to rehabilitation as they have previously been arranging. He has done well with rehabbing in the past. If he does well and continues to thrive, I encouraged they continue with rehabilitation. If, however he is unable to regain function and he continues to decline, I recommended that she speak with his physicians at rehabilitation to determine if he may be better served by focusing his care on staying out of the hospital with support of organization such as hospice.  Constance Holster was much more open to this concept than she was during his last visit when I spoken with her. She reports that she know she was in denial, but she is starting realize just how sick he actually is. I encouraged her to continue to work  with him and his care team to ensure that we focus on medical therapies that are likely help him maximize his goals spending as much times possible out of the hospital. I encouraged her that hospice would be a good support system that would really be able to focus on making sure he feels as well as he can for long as he can. She reports she will continue to consider this.  Length of Stay: 8 days  Current Medications: Scheduled Meds:    Continuous Infusions:   PRN Meds:   Palliative Performance Scale: 30%     Vital Signs: BP 125/75 mmHg  Pulse 89  Temp(Src) 98.2 F (36.8 C) (Oral)  Resp 17  Ht 5' 11"  (1.803 m)  Wt 111.267 kg (245 lb 4.8 oz)  BMI 34.23 kg/m2  SpO2 100% SpO2: SpO2: 100 % O2 Device: O2 Device: Not Delivered O2 Flow Rate:    Intake/output summary:  Intake/Output Summary (Last 24 hours) at 02/02/15 1918 Last data filed at 02/02/15 1352  Gross per 24 hour  Intake   1572 ml  Output    201 ml  Net   1371 ml   LBM:   Baseline Weight: Weight: 114.3 kg (251 lb 15.8 oz) Most recent weight: Weight: 111.267 kg (245 lb 4.8 oz)  Physical Exam:   General: Adult male, No acute distress, speech is normal in tempo but he remains mildly  confused  HEENT: NCAT, MMM  Cardiovascular: RRR, nl S1, S2 2/6 systolic murmur, 2+ pulses, warm extremities  Respiratory: CTAB anteriorly, no increased WOB  Abdomen: NABS, soft, moderately distended, nontender  MSK: Normal tone and bulk, massively volume overloaded on his legs, 3+ pitting edema in dependent areas, scrotum swollen  Neuro: Cranial nerves II through XII grossly intact, 4/5 strength throughout               Additional Data Reviewed: Recent Labs     02/01/15  0450  02/02/15  0406  WBC  5.4  5.4  HGB  8.5*  8.7*  PLT  45*  44*  NA  133*  132*  BUN  65*  65*  CREATININE  2.25*  2.20*     Problem List:  Patient Active Problem List   Diagnosis Date Noted  . Headache 01/30/2015  . Palliative  care encounter   . ALC (alcoholic liver cirrhosis) 01/25/2015  . Acute urinary retention   . Moderate protein-calorie malnutrition   . Other emphysema   . Generalized weakness   . Goals of care, counseling/discussion   . Other cirrhosis of liver   . Severe muscle deconditioning   . Weakness generalized   . Encounter for palliative care   . Anasarca   . Fall 01/17/2015  . Sepsis 01/17/2015  . Cellulitis 01/17/2015  . Lactic acidosis 01/17/2015  . Protein-calorie malnutrition, severe 12/19/2014  . Pressure ulcer 12/15/2014  . Acute respiratory failure 12/14/2014  . Acute renal failure 12/14/2014  . Chronic diastolic heart failure 91/63/8466  . Ascites   . Upper GI bleed   . Medically noncompliant 09/26/2014  . UTI (lower urinary tract infection)   . Acute blood loss anemia   . Alcoholic cirrhosis of liver without ascites   . Acute combined systolic and diastolic congestive heart failure   . AKI (acute kidney injury)   . Respiratory failure   . Unresponsive   . Hyperkalemia 07/28/2014  . Encephalopathy 07/28/2014  . Suicidal ideations   . Hx of bipolar disorder   . Alcoholic cirrhosis of liver with ascites   . PN (peripheral neuropathy)   . Retinal detachment   . MDD (major depressive disorder) 02/14/2014  . Depression, major, recurrent, moderate 02/05/2014  . Anemia 01/27/2014  . GI bleed 01/14/2014  . Altered mental status 09/13/2013  . Hypoglycemic encephalopathy 09/13/2013  . Metabolic acidosis 59/93/5701  . Acute kidney injury 09/13/2013  . Acute encephalopathy 08/28/2013  . Encephalopathy, hepatic 08/28/2013  . Pancytopenia 04/11/2013  . Falls 04/11/2013  . Hepatic encephalopathy 04/01/2013  . Tobacco abuse 03/11/2013  . Cellulitis of lower leg 12/13/2012  . Bipolar affect, depressed 12/13/2012  . Serum ammonia increased 06/20/2012  . Cirrhosis   . Thrombocytopenia 04/27/2012  . Weakness 04/27/2012  . Depression 04/27/2012  . Ulcer, stomach peptic  04/27/2012  . Acute GI bleeding 04/04/2012  . HTN (hypertension) 04/04/2012  . Diabetes mellitus 04/04/2012  . Alcohol abuse 04/08/2011    Class: Acute     Palliative Care Assessment & Plan    Code Status:  DNR  Goals of Care: Remains complex situation where the patient is not really able to engage in goals of care discussion because he is in denial about how sick he actually is. I discussed again with his long-term girlfriend, norma. She did report last visit that they had recently broken up, however he has named her as whom he would like to service surrogate decision maker. She reports  that she is realizing how sick he is and is trying to help him understand this as well.  Psycho-social/Spiritual:  Desire for further Chaplaincy support:no   Prognosis: < 6 months Discharge Planning: [place] for rehab with Palliative care service follow-up   Care plan was discussed with the patient and his surrogate, Constance Holster  Thank you for allowing the Palliative Medicine Team to assist in the care of this patient.   Time In: 1030 Time Out: 1100 Total Time 30 Prolonged Time Billed no    Greater than 50%  of this time was spent counseling and coordinating care related to the above assessment and plan.   Micheline Rough, MD  02/02/2015, 7:18 PM  Please contact Palliative Medicine Team phone at [phone] for questions and concerns.

## 2015-02-02 NOTE — Progress Notes (Signed)
Patient seen and examined.  Continues to have some nausea, but eating and drinking well.  Creatinine continues to improve.  Edema is gradually resolving.  He was seen again by psychiatry this morning who recommended increasing the Effexor dose.  I have updated the discharge medication list to reflect this change.  See below.  Patient is medically stable for discharge to SNF when bed available.      Medication List    STOP taking these medications        albuterol (2.5 MG/3ML) 0.083% nebulizer solution  Commonly known as:  PROVENTIL     CALCIUM-VITAMIN D PO     clonazePAM 0.25 MG disintegrating tablet  Commonly known as:  KLONOPIN     methocarbamol 500 MG tablet  Commonly known as:  ROBAXIN     NOVOLOG FLEXPEN 100 UNIT/ML FlexPen  Generic drug:  insulin aspart     propranolol 20 MG tablet  Commonly known as:  INDERAL     spironolactone 50 MG tablet  Commonly known as:  ALDACTONE      TAKE these medications        amitriptyline 10 MG tablet  Commonly known as:  ELAVIL  Take 1 tablet (10 mg total) by mouth at bedtime.     collagenase ointment  Commonly known as:  SANTYL  Apply topically daily.     diclofenac sodium 1 % Gel  Commonly known as:  VOLTAREN  Apply 4 g topically as needed (for back of leg pain).     doxycycline 100 MG tablet  Commonly known as:  VIBRA-TABS  Take 1 tablet (100 mg total) by mouth 2 (two) times daily.     feeding supplement (PRO-STAT SUGAR FREE 64) Liqd  Take 30 mLs by mouth 2 (two) times daily.     feeding supplement Liqd  Take 1 Container by mouth 3 (three) times daily between meals.     ferrous sulfate 325 (65 FE) MG tablet  Take 1 tablet (325 mg total) by mouth 2 (two) times daily with a meal.     folic acid 1 MG tablet  Commonly known as:  FOLVITE  Take 1 tablet (1 mg total) by mouth daily.     furosemide 80 MG tablet  Commonly known as:  LASIX  Take 1 tablet (80 mg total) by mouth 2 (two) times daily.     hydrOXYzine 50 MG  tablet  Commonly known as:  ATARAX/VISTARIL  Take 1 tablet (50 mg total) by mouth at bedtime.     lactulose 10 GM/15ML solution  Commonly known as:  CHRONULAC  Take 45 mLs (30 g total) by mouth 2 (two) times daily.     magnesium oxide 400 (241.3 MG) MG tablet  Commonly known as:  MAG-OX  Take 1 tablet (400 mg total) by mouth 2 (two) times daily.     midodrine 10 MG tablet  Commonly known as:  PROAMATINE  Take 1 tablet (10 mg total) by mouth 3 (three) times daily with meals.     multivitamin with minerals Tabs tablet  Take 1 tablet by mouth daily.     ondansetron 4 MG disintegrating tablet  Commonly known as:  ZOFRAN-ODT  Take 1 tablet (4 mg total) by mouth every 8 (eight) hours as needed for nausea or vomiting.     oxyCODONE 5 MG immediate release tablet  Commonly known as:  Oxy IR/ROXICODONE  Take 1 tablet (5 mg total) by mouth every 6 (six) hours as needed for moderate  pain or severe pain.     pantoprazole 40 MG tablet  Commonly known as:  PROTONIX  Take 1 tablet (40 mg total) by mouth daily.     potassium chloride SA 20 MEQ tablet  Commonly known as:  K-DUR,KLOR-CON  Take 1 tablet (20 mEq total) by mouth daily.     rifaximin 550 MG Tabs tablet  Commonly known as:  XIFAXAN  Take 1 tablet (550 mg total) by mouth 2 (two) times daily.     thiamine 100 MG tablet  Commonly known as:  VITAMIN B-1  Take 100 mg by mouth daily.     venlafaxine 37.5 MG tablet  Commonly known as:  EFFEXOR  Take 1 tablet (37.5 mg total) by mouth 2 (two) times daily with a meal.

## 2015-02-02 NOTE — Consult Note (Signed)
[facility] Consult follow up  Reason for Consult:  Bipolar depression, substance abuse and hyponatremia Referring Physician:  Dr. Sheran Fava Patient Identification: [name] MRN:  [id number] Principal Diagnosis: AKI (acute kidney injury) Diagnosis:   Patient Active Problem List   Diagnosis Date Noted  . Headache [R51] 01/30/2015  . Palliative care encounter [Z51.5]   . ALC (alcoholic liver cirrhosis) [K70.30] 01/25/2015  . Acute urinary retention [R33.8]   . Moderate protein-calorie malnutrition [E44.0]   . Other emphysema [J43.8]   . Generalized weakness [R53.1]   . Goals of care, counseling/discussion [Z71.89]   . Other cirrhosis of liver [K74.69]   . Severe muscle deconditioning [R29.898]   . Weakness generalized [R53.1]   . Encounter for palliative care [Z51.5]   . Anasarca [R60.1]   . Fall [W19.XXXA] 01/17/2015  . Sepsis [A41.9] 01/17/2015  . Cellulitis [L03.90] 01/17/2015  . Lactic acidosis [E87.2] 01/17/2015  . Protein-calorie malnutrition, severe [E43] 12/19/2014  . Pressure ulcer [L89.90] 12/15/2014  . Acute respiratory failure [J96.00] 12/14/2014  . Acute renal failure [N17.9] 12/14/2014  . Chronic diastolic heart failure [F81.01] 11/30/2014  . Ascites [R18.8]   . Upper GI bleed [K92.2]   . Medically noncompliant [Z91.19] 09/26/2014  . UTI (lower urinary tract infection) [N39.0]   . Acute blood loss anemia [D62]   . Alcoholic cirrhosis of liver without ascites [K70.30]   . Acute combined systolic and diastolic congestive heart failure [I50.41]   . AKI (acute kidney injury) [N17.9]   . Respiratory failure [J96.90]   . Unresponsive [R40.4]   . Hyperkalemia [E87.5] 07/28/2014  . Encephalopathy [G93.40] 07/28/2014  . Suicidal ideations [R45.851]   . Hx of bipolar disorder [Z86.59]   . Alcoholic cirrhosis of liver with ascites [K70.31]   . PN (peripheral neuropathy) [G62.9]   . Retinal detachment [H33.20]   . MDD (major depressive disorder)  [F32.2] 02/14/2014  . Depression, major, recurrent, moderate [F33.1] 02/05/2014  . Anemia [D64.9] 01/27/2014  . GI bleed [K92.2] 01/14/2014  . Altered mental status [R41.82] 09/13/2013  . Hypoglycemic encephalopathy [E16.2] 09/13/2013  . Metabolic acidosis [B51.0] 09/13/2013  . Acute kidney injury [N17.9] 09/13/2013  . Acute encephalopathy [G93.40] 08/28/2013  . Encephalopathy, hepatic [K72.90] 08/28/2013  . Pancytopenia [D61.818] 04/11/2013  . Falls [id number]] 04/11/2013  . Hepatic encephalopathy [K72.90] 04/01/2013  . Tobacco abuse [Z72.0] 03/11/2013  . Cellulitis of lower leg [L03.119] 12/13/2012  . Bipolar affect, depressed [F31.30] 12/13/2012  . Serum ammonia increased [E72.20] 06/20/2012  . Cirrhosis [K74.60]   . Thrombocytopenia [D69.6] 04/27/2012  . Weakness [R53.1] 04/27/2012  . Depression [F32.9] 04/27/2012  . Ulcer, stomach peptic [K25.9] 04/27/2012  . Acute GI bleeding [K92.2] 04/04/2012  . HTN (hypertension) [I10] 04/04/2012  . Diabetes mellitus [E11.9] 04/04/2012  . Alcohol abuse [F10.10] 04/08/2011    Class: Acute    Total Time spent with patient: 45 minutes  Subjective:   [name] is a 59 y.o. male patient admitted with depression with alcoholic cirrhosis.  HPI:  Patient is a 59 year old  seen face-to-face for psychiatric consultation and evaluation of increased symptoms of depression, sad, tearful, isolated, withdrawn, generalized weakness, abdominal discomfort. Patient reported disturbance of sleep and appetite but denies active suicidal ideation, intention or plans. He has no homicidal ideation, intention or plans. Patient has no evidence of psychosis. Patient is willing to try medication which might help him to sleep better. Patient is known to have noncompliant with his medication management.   HPI Elements:   Location:  Depression.  Quality:  Poor. Severity:  Chronic with acute exacerbation. Timing:  Noncompliance. Duration:  Few months. Context:   Psychosocial stressors and substance abuse.   Interval History: patient continue to suffer with depression and tolerating with his effexor and able to improve his hyponatremia. Will increase Effexor for better control of his depression. He has no safety concerns. He has no agitation or aggression.   Past Medical History:  Past Medical History  Diagnosis Date  . Neuropathy   . Diabetes mellitus   . Bipolar affect, depressed   . Hypertension   . Arthritis   . Stroke     Mini stroke about 63yr ago  . Cirrhosis   . Alcohol abuse   . Chronic pain   . Cocaine abuse   . Muscle spasm     both legs  . Encephalopathy, hepatic   . Detached retina   . COPD (chronic obstructive pulmonary disease)     emphysema  . Bronchitis   . Barrett's esophagus   . GERD (gastroesophageal reflux disease)     has ulcer  . Anemia     Past Surgical History  Procedure Laterality Date  . Fracture surgery      Leg and arm 181yrago  . Esophagogastroduodenoscopy  04/04/2012    Procedure: ESOPHAGOGASTRODUODENOSCOPY (EGD);  Surgeon: JoIrene ShipperMD;  [facility];  Service: Endoscopy;  Laterality: N/A;  . Esophagogastroduodenoscopy Left 03/13/2013    Procedure: ESOPHAGOGASTRODUODENOSCOPY (EGD);  Surgeon: WiArta SilenceMD;  [facility];  Service: Endoscopy;  Laterality: Left;  . Marland Kitchenye surgery  8 months ago both eyes    cataracts both eyes, detached eye, gas pocket  . Vasectomy    . Pars plana vitrectomy Left 07/08/2013    Procedure: PARS PLANA VITRECTOMY WITH 25 GAUGE;  Surgeon: GaHurman HornMD;  [facility];  Laterality: Left;  . Intraocular lens removal Left 07/08/2013    Procedure: REMOVAL OF INTRAOCULAR LENS;  Surgeon: GaHurman HornMD;  [facility];  Laterality: Left;  . Placement and suture of secondary intraocular lens Left 07/08/2013    Procedure: PLACEMENT AND SUTURE OF SECONDARY INTRAOCULAR LENS;  Surgeon: GaHurman HornMD;  [facility]: [facility]hthal[facility];  Laterality: Left;  Insertion of Anterior Capsule Intraocular Lens   . Esophagogastroduodenoscopy N/A 01/16/2014    Procedure: ESOPHAGOGASTRODUODENOSCOPY (EGD);  Surgeon: JaWinfield Cunas MD;  [facility];  Service: Endoscopy;  Laterality: N/A;  . Colonoscopy N/A 01/17/2014    Procedure: COLONOSCOPY;  Surgeon: JaWinfield Cunas MD;  [facility];  Service: Endoscopy;  Laterality: N/A;  possible banding  . Esophagogastroduodenoscopy N/A 08/30/2014    Procedure: ESOPHAGOGASTRODUODENOSCOPY (EGD);  Surgeon: ViWilford CornerMD;  [facility];  Service: Endoscopy;  Laterality: N/A;  bedside  . Esophagogastroduodenoscopy N/A 11/30/2014    Procedure: ESOPHAGOGASTRODUODENOSCOPY (EGD);  Surgeon: ViWilford CornerMD;  [facility];  Service: Endoscopy;  Laterality: N/A;  . Esophagogastroduodenoscopy N/A 12/15/2014    Procedure: ESOPHAGOGASTRODUODENOSCOPY (EGD);  Surgeon: JoTeena IraniMD;  [facility];  Service: Endoscopy;  Laterality: N/A;   Family History:  Family History  Problem Relation Age of Onset  . Hypotension Mother    Social History:  History  Alcohol Use  . 0.0 oz/week    Comment: 12 pk beer daily  06/2013 - no alcohol since 11/2012     History  Drug Use  . Yes  . Special: Cocaine  Comment: 1 gram weekly  06/2013 - has not had any for a year    Social History   Social History  . Marital Status: Divorced    Spouse Name: N/A  . Number of Children: N/A  . Years of Education: N/A   Social History Main Topics  . Smoking status: Current Every Day Smoker -- 1.00 packs/day for 30 years    Types: Cigarettes  . Smokeless tobacco: Never Used  . Alcohol Use: 0.0 oz/week     Comment: 12 pk beer daily  06/2013 - no alcohol since 11/2012  . Drug Use: Yes    Special: Cocaine     Comment: 1 gram weekly  06/2013 - has not had any for a year  . Sexual Activity: Yes    Birth Control/  Protection: None   Other Topics Concern  . None   Social History Narrative   Additional Social History:                          Allergies:  No Known Allergies  Labs:  Results for orders placed or performed during the hospital encounter of 01/25/15 (from the past 48 hour(s))  Glucose, capillary     Status: Abnormal   Collection Time: 01/31/15 12:00 PM  Result Value Ref Range   Glucose-Capillary 213 (H) 65 - 99 mg/dL  Glucose, capillary     Status: Abnormal   Collection Time: 01/31/15  8:26 PM  Result Value Ref Range   Glucose-Capillary 168 (H) 65 - 99 mg/dL  CBC     Status: Abnormal   Collection Time: 02/01/15  4:50 AM  Result Value Ref Range   WBC 5.4 4.0 - 10.5 K/uL   RBC 2.73 (L) 4.22 - 5.81 MIL/uL   Hemoglobin 8.5 (L) 13.0 - 17.0 g/dL   HCT 25.2 (L) 39.0 - 52.0 %   MCV 92.3 78.0 - 100.0 fL   MCH 31.1 26.0 - 34.0 pg   MCHC 33.7 30.0 - 36.0 g/dL   RDW 20.3 (H) 11.5 - 15.5 %   Platelets 45 (L) 150 - 400 K/uL    Comment: SPECIMEN CHECKED FOR CLOTS REPEATED TO VERIFY CONSISTENT WITH PREVIOUS RESULT   Protime-INR     Status: Abnormal   Collection Time: 02/01/15  4:50 AM  Result Value Ref Range   Prothrombin Time 26.0 (H) 11.6 - 15.2 seconds   INR 2.41 (H) 0.00 - 1.49  Magnesium     Status: None   Collection Time: 02/01/15  4:50 AM  Result Value Ref Range   Magnesium 1.7 1.7 - 2.4 mg/dL  Renal function panel     Status: Abnormal   Collection Time: 02/01/15  4:50 AM  Result Value Ref Range   Sodium 133 (L) 135 - 145 mmol/L   Potassium 3.1 (L) 3.5 - 5.1 mmol/L   Chloride 104 101 - 111 mmol/L   CO2 21 (L) 22 - 32 mmol/L   Glucose, Bld 176 (H) 65 - 99 mg/dL   BUN 65 (H) 6 - 20 mg/dL   Creatinine, Ser 2.25 (H) 0.61 - 1.24 mg/dL   Calcium 8.0 (L) 8.9 - 10.3 mg/dL   Phosphorus 3.7 2.5 - 4.6 mg/dL   Albumin 2.7 (L) 3.5 - 5.0 g/dL   GFR calc non Af Amer 30 (L) >60 mL/min   GFR calc Af Amer 35 (L) >60 mL/min    Comment: (NOTE) The eGFR has been calculated  using the CKD EPI  equation. This calculation has not been validated in all clinical situations. eGFR's persistently <60 mL/min signify possible Chronic Kidney Disease.    Anion gap 8 5 - 15  CBC     Status: Abnormal   Collection Time: 02/02/15  4:06 AM  Result Value Ref Range   WBC 5.4 4.0 - 10.5 K/uL   RBC 2.84 (L) 4.22 - 5.81 MIL/uL   Hemoglobin 8.7 (L) 13.0 - 17.0 g/dL   HCT 26.4 (L) 39.0 - 52.0 %   MCV 93.0 78.0 - 100.0 fL   MCH 30.6 26.0 - 34.0 pg   MCHC 33.0 30.0 - 36.0 g/dL   RDW 20.5 (H) 11.5 - 15.5 %   Platelets 44 (L) 150 - 400 K/uL    Comment: SPECIMEN CHECKED FOR CLOTS REPEATED TO VERIFY PLATELET COUNT CONFIRMED BY SMEAR   Protime-INR     Status: Abnormal   Collection Time: 02/02/15  4:06 AM  Result Value Ref Range   Prothrombin Time 26.4 (H) 11.6 - 15.2 seconds   INR 2.47 (H) 0.00 - 1.49  Magnesium     Status: None   Collection Time: 02/02/15  4:06 AM  Result Value Ref Range   Magnesium 1.7 1.7 - 2.4 mg/dL  Renal function panel     Status: Abnormal   Collection Time: 02/02/15  4:06 AM  Result Value Ref Range   Sodium 132 (L) 135 - 145 mmol/L   Potassium 3.1 (L) 3.5 - 5.1 mmol/L   Chloride 103 101 - 111 mmol/L   CO2 21 (L) 22 - 32 mmol/L   Glucose, Bld 231 (H) 65 - 99 mg/dL   BUN 65 (H) 6 - 20 mg/dL   Creatinine, Ser 2.20 (H) 0.61 - 1.24 mg/dL   Calcium 7.8 (L) 8.9 - 10.3 mg/dL   Phosphorus 3.5 2.5 - 4.6 mg/dL   Albumin 2.7 (L) 3.5 - 5.0 g/dL   GFR calc non Af Amer 31 (L) >60 mL/min   GFR calc Af Amer 36 (L) >60 mL/min    Comment: (NOTE) The eGFR has been calculated using the CKD EPI equation. This calculation has not been validated in all clinical situations. eGFR's persistently <60 mL/min signify possible Chronic Kidney Disease.    Anion gap 8 5 - 15    Vitals: Blood pressure 125/75, pulse 89, temperature 98.2 F (36.8 C), temperature source Oral, resp. rate 17, height 5' 11"  (1.803 m), weight 111.267 kg (245 lb 4.8 oz), SpO2 100 %.  Risk to  Self: Is patient at risk for suicide?: No Risk to Others:   Prior Inpatient Therapy:   Prior Outpatient Therapy:    Current Facility-Administered Medications  Medication Dose Route Frequency Provider Last Rate Last Dose  . 0.9 %  sodium chloride infusion   Intravenous PRN Velvet Bathe, MD   Stopped at 01/27/15 0900  . 0.9 %  sodium chloride infusion   Intravenous Once Jeryl Columbia, NP      . albuterol (PROVENTIL) (2.5 MG/3ML) 0.083% nebulizer solution 2.5 mg  2.5 mg Nebulization Q4H PRN Allie Bossier, MD      . amitriptyline (ELAVIL) tablet 10 mg  10 mg Oral QHS Janece Canterbury, MD   10 mg at 02/01/15 2307  . cefTRIAXone (ROCEPHIN) 1 g in dextrose 5 % 50 mL IVPB  1 g Intravenous Q24H Lauren D Bajbus, RPH 100 mL/hr at 02/01/15 2310 1 g at 02/01/15 2310  . collagenase (SANTYL) ointment   Topical Daily Allie Bossier, MD      .  diclofenac sodium (VOLTAREN) 1 % transdermal gel 4 g  4 g Topical QID PRN Allie Bossier, MD   4 g at 01/26/15 1001  . feeding supplement (BOOST / RESOURCE BREEZE) liquid 1 Container  1 Container Oral TID BM Allie Bossier, MD   1 Container at 02/01/15 2311  . feeding supplement (PRO-STAT SUGAR FREE 64) liquid 30 mL  30 mL Oral BID Allie Bossier, MD   30 mL at 02/02/15 1007  . ferrous sulfate tablet 325 mg  325 mg Oral BID WC Janece Canterbury, MD   325 mg at 02/02/15 0744  . folic acid (FOLVITE) tablet 1 mg  1 mg Oral Daily Allie Bossier, MD   1 mg at 02/02/15 1005  . furosemide (LASIX) injection 80 mg  80 mg Intravenous 3 times per day Corliss Parish, MD   80 mg at 02/02/15 0539  . hydrOXYzine (ATARAX/VISTARIL) tablet 50 mg  50 mg Oral QHS Ambrose Finland, MD   50 mg at 02/01/15 2309  . lactulose (CHRONULAC) 10 GM/15ML solution 30 g  30 g Oral BID Janece Canterbury, MD   30 g at 02/01/15 2307  . LORazepam (ATIVAN) tablet 0.5 mg  0.5 mg Oral Q6H PRN Janece Canterbury, MD   0.5 mg at 01/31/15 0000  . magnesium oxide (MAG-OX) tablet 400 mg  400 mg Oral BID  Allie Bossier, MD   400 mg at 02/02/15 1007  . metroNIDAZOLE (FLAGYL) tablet 500 mg  500 mg Oral 3 times per day Janece Canterbury, MD   500 mg at 02/02/15 0539  . midodrine (PROAMATINE) tablet 10 mg  10 mg Oral TID WC Janece Canterbury, MD   10 mg at 02/02/15 0744  . octreotide (SANDOSTATIN) injection 100 mcg  100 mcg Subcutaneous 3 times per day Janece Canterbury, MD   100 mcg at 02/02/15 0539  . ondansetron (ZOFRAN-ODT) disintegrating tablet 4 mg  4 mg Oral Q8H PRN Velvet Bathe, MD   4 mg at 02/02/15 0758  . oxyCODONE (Oxy IR/ROXICODONE) immediate release tablet 5 mg  5 mg Oral Q6H PRN Janece Canterbury, MD   5 mg at 02/01/15 1723  . pantoprazole (PROTONIX) EC tablet 40 mg  40 mg Oral Daily Allie Bossier, MD   40 mg at 02/02/15 1007  . potassium chloride SA (K-DUR,KLOR-CON) CR tablet 30 mEq  30 mEq Oral BID Janece Canterbury, MD   30 mEq at 02/02/15 1006  . rifaximin (XIFAXAN) tablet 550 mg  550 mg Oral BID Allie Bossier, MD   550 mg at 02/02/15 1007  . sodium chloride 0.9 % bolus 500 mL  500 mL Intravenous Once Velvet Bathe, MD   500 mL at 01/26/15 1330  . thiamine (VITAMIN B-1) tablet 100 mg  100 mg Oral Daily Allie Bossier, MD   100 mg at 02/02/15 1007  . venlafaxine (EFFEXOR) tablet 25 mg  25 mg Oral BID WC Ambrose Finland, MD   25 mg at 02/02/15 0744    Musculoskeletal: Strength & Muscle Tone: decreased Gait & Station: unable to stand Patient leans: N/A  Psychiatric Specialty Exam: Physical Exam  ROS:  Blood pressure 125/75, pulse 89, temperature 98.2 F (36.8 C), temperature source Oral, resp. rate 17, height 5' 11"  (1.803 m), weight 111.267 kg (245 lb 4.8 oz), SpO2 100 %.Body mass index is 34.23 kg/(m^2).  General Appearance: Guarded  Eye Contact::  Good  Speech:  Clear and Coherent and Slow  Volume:  Decreased  Mood:  Anxious and Depressed  Affect:  Depressed and Tearful  Thought Process:  Coherent and Goal Directed  Orientation:  Full (Time, Place, and Person)  Thought  Content:  WDL  Suicidal Thoughts:  No  Homicidal Thoughts:  No  Memory:  Immediate;   Fair Recent;   Fair  Judgement:  Fair  Insight:  Shallow  Psychomotor Activity:  Decreased  Concentration:  Fair  Recall:  [employer] of Knowledge:Fair  Language: Fair  Akathisia:  NA  Handed:  Right  AIMS (if indicated):     Assets:  Communication Skills Desire for Improvement Housing Leisure Time Resilience Social Support  ADL's:  Impaired  Cognition: WNL  Sleep:      Medical Decision Making: New problem, with additional work up planned, Review of Psycho-Social Stressors (1), Established Problem, Worsening (2), Review of Last Therapy Session (1), Review or order medicine tests (1), Review of Medication Regimen & Side Effects (2) and Review of New Medication or Change in Dosage (2)  Treatment Plan Summary: Daily contact with patient to assess and evaluate symptoms and progress in treatment and Medication management  Plan: Depression:  Increase Effexor 37.5 mg for depression  Hydroxyzine 50 mg at bedtime for insomnia Monitor for hyponatremia Patient does not meet criteria for psychiatric inpatient admission. Supportive therapy provided about ongoing stressors. Appreciate psychiatric consultation Please contact [phone] or [phone] if needs further assistance   Disposition: Patient benefit from palliative care and outpatient psychiatric medication management when medically stable.  Bailley Guilford,JANARDHAHA R. 02/02/2015 11:26 AM

## 2015-02-02 NOTE — Progress Notes (Signed)
Occupational Therapy Treatment Patient Details Name: [name] MRN: [id number] DOB: 1956/01/18 Today's Date: 02/02/2015    History of present illness 59 y.o. WM PMHx Depression, Bipolar affect, Alcohol Abuse, Cocaine abuse, Cirrhosis of liver on transplant list with Chronic Anasarca/Ascites, Diabetes Mellitus, Hypertension, COPD, CVA, Chronic pancytopenia, CKD(baseline Cr 1.6-2.0), GERD. Pt just discharged on 01/23/2015 . Admitted with pain and ascites   OT comments  Patient making slow progress towards OT goals, continue plan of care for now. Pt continues to be limited by swollen scrotum and BLE pain. Pt very deconditioned and continues to benefit from skilled acute OT. Pt will benefit from rehab at SNF to maximize independence prior to discharging home.    Follow Up Recommendations  SNF;Supervision/Assistance - 24 hour    Equipment Recommendations  Other (comment) (TBD next venue of care)    Recommendations for Other Services  None at this time   Precautions / Restrictions Precautions Precautions: Fall Precaution Comments: very swollon scrotum, incontinent of stool Restrictions Weight Bearing Restrictions: No    Mobility Bed Mobility Overal bed mobility: Needs Assistance Bed Mobility: Rolling;Sidelying to Sit Rolling: Mod assist Sidelying to sit: Mod assist       General bed mobility comments: Mod assist to assist with managing of BLEs and for trunk support to sitting. Therapist using bed chuck to square up hips > EOB.   Transfers General transfer comment: Did not occur. Pt with increased dizziness and requested to lay back down.     Balance Overall balance assessment: Needs assistance Sitting-balance support: No upper extremity supported;Feet supported Sitting balance-Leahy Scale: Poor   ADL Overall ADL's : Needs assistance/impaired General ADL Comments: Pt continues to have a swollen scrotum which limits his ability to perform ADLs, bed mobility, and functional  transfers. Pt willing to work with therapist and engaged in bed mobility to sit EOB. Pt with poor static sitting balance and unable to reach BLE for LB ADLs, recommending AE to increase independence with this. During bed mobiliity of rolling, therapist noticed open area > sacrum; educated pt to roll and get off back for pressure relief and notified RN of sore (possible bed sore) .      Cognition   Behavior During Therapy: Flat affect Overall Cognitive Status: Within Functional Limits for tasks assessed (Pt requires extra time, but overall WFL)                 Pertinent Vitals/ Pain       Pain Assessment: 0-10 Pain Score: 10-Worst pain ever Pain Location: scrotum  Pain Descriptors / Indicators: Tightness;Sore Pain Intervention(s): Limited activity within patient's tolerance;Monitored during session;Repositioned   Frequency Min 2X/week     Progress Toward Goals  OT Goals(current goals can now befound in the care plan section)  Progress towards OT goals: Progressing toward goals (slow progression towards goals)     Plan Discharge plan remains appropriate    Activity Tolerance Patient tolerated treatment well  Patient Left in bed;with call bell/phone within reach;with SCD's reapplied  Nurse Communication Other (comment) (Patient's open area on sacrum, possibly bed sore)     Time: 0600-4599 OT Time Calculation (min): 22 min  Charges: OT General Charges $OT Visit: 1 Procedure OT Treatments $Self Care/Home Management : 8-22 mins  Lewin Pellow , MS, OTR/L, CLT Pager: [phone]  02/02/2015, 3:36 PM

## 2015-02-02 NOTE — Progress Notes (Signed)
Report called to [facility] and [facility]. All questions answered.

## 2015-02-02 NOTE — Clinical Social Work Placement (Signed)
   CLINICAL SOCIAL WORK PLACEMENT  NOTE  Date:  02/02/2015  Patient Details  Name: [name] MRN: [id number] Date of Birth: October 23, 1955  Clinical Social Work is seeking post-discharge placement for this patient at the [place] level of care (*CSW will initial, date and re-position this form in  chart as items are completed):  Yes   Patient/family provided with [place] Work Department's list of facilities offering this level of care within the geographic area requested by the patient (or if unable, by the patient's family).  Yes   Patient/family informed of their freedom to choose among providers that offer the needed level of care, that participate in Medicare, Medicaid or managed care program needed by the patient, have an available bed and are willing to accept the patient.  Yes   Patient/family informed of [facility]'s ownership interest in [facility] and [facility], as well as of the fact that they are under no obligation to receive care at these facilities.  PASRR submitted to EDS on       PASRR number received on       Existing PASRR number confirmed on 02/01/15     FL2 transmitted to all facilities in geographic area requested by pt/family on 02/01/15     FL2 transmitted to all facilities within larger geographic area on       Patient informed that his/her managed care company has contracts with or will negotiate with certain facilities, including the following:        Yes (CSW talked with friend Serita Butcher)   Patient/family informed of bed offers received.  Patient chooses bed at [facility]     Physician recommends and patient chooses bed at      Patient to be transferred to [facility] on 02/02/15.  Patient to be transferred to facility by  ambulance Corey Harold)     Patient family notified on 02/02/15 of transfer.  Name of family member notified:  Serita Butcher, friend     PHYSICIAN      Additional Comment:    _______________________________________________ Sable Feil, LCSW 02/02/2015, 3:34 PM

## 2015-02-04 ENCOUNTER — Other Ambulatory Visit (HOSPITAL_COMMUNITY): Payer: Self-pay | Admitting: Internal Medicine

## 2015-02-04 DIAGNOSIS — R188 Other ascites: Secondary | ICD-10-CM

## 2015-02-09 ENCOUNTER — Other Ambulatory Visit (HOSPITAL_COMMUNITY): Payer: Self-pay | Admitting: Internal Medicine

## 2015-02-09 DIAGNOSIS — R188 Other ascites: Secondary | ICD-10-CM

## 2015-02-10 ENCOUNTER — Ambulatory Visit (HOSPITAL_COMMUNITY)
Admission: RE | Admit: 2015-02-10 | Discharge: 2015-02-10 | Disposition: A | Discharge status: Home / Self Care | Payer: Medicare PPO | Source: Ambulatory Visit | Attending: Internal Medicine | Admitting: Internal Medicine

## 2015-02-10 DIAGNOSIS — K7460 Unspecified cirrhosis of liver: Secondary | ICD-10-CM | POA: Insufficient documentation

## 2015-02-10 DIAGNOSIS — R188 Other ascites: Secondary | ICD-10-CM | POA: Insufficient documentation

## 2015-02-10 MED ORDER — LIDOCAINE HCL (PF) 1 % IJ SOLN
INTRAMUSCULAR | Status: AC
  Filled 2015-02-10: qty 10

## 2015-02-10 NOTE — Procedures (Signed)
US guided therapeutic paracentesis performed yielding 6 L yellow fluid. No immediate complications.

## 2015-02-18 ENCOUNTER — Other Ambulatory Visit (HOSPITAL_COMMUNITY): Payer: Self-pay | Admitting: Internal Medicine

## 2015-02-18 DIAGNOSIS — R188 Other ascites: Secondary | ICD-10-CM

## 2015-02-19 ENCOUNTER — Ambulatory Visit (HOSPITAL_COMMUNITY)
Admission: RE | Admit: 2015-02-19 | Discharge: 2015-02-19 | Disposition: A | Discharge status: Home / Self Care | Payer: Medicare PPO | Source: Ambulatory Visit | Attending: Internal Medicine | Admitting: Internal Medicine

## 2015-02-19 DIAGNOSIS — K7031 Alcoholic cirrhosis of liver with ascites: Secondary | ICD-10-CM | POA: Diagnosis not present

## 2015-02-19 DIAGNOSIS — R188 Other ascites: Secondary | ICD-10-CM

## 2015-02-19 MED ORDER — LIDOCAINE HCL (PF) 1 % IJ SOLN
INTRAMUSCULAR | Status: AC
  Filled 2015-02-19: qty 10

## 2015-02-19 NOTE — Procedures (Signed)
Successful US guided paracentesis from RLQ.  Yielded 6.6 liters of clear yellow fluid.  No immediate complications.  Pt tolerated well.   Specimen was not sent for labs.  WENDY S BLAIR PA-C 02/19/2015 4:30 PM

## 2015-02-25 ENCOUNTER — Other Ambulatory Visit (HOSPITAL_COMMUNITY): Payer: Self-pay | Admitting: Internal Medicine

## 2015-02-25 DIAGNOSIS — R188 Other ascites: Secondary | ICD-10-CM

## 2015-03-01 ENCOUNTER — Encounter (HOSPITAL_COMMUNITY): Payer: Self-pay | Admitting: Nurse Practitioner

## 2015-03-01 ENCOUNTER — Inpatient Hospital Stay (HOSPITAL_COMMUNITY)
Admission: EM | Admit: 2015-03-01 | Discharge: 2015-03-24 | DRG: 871 | Disposition: E | Payer: Medicare PPO | Attending: Family Medicine | Admitting: Family Medicine

## 2015-03-01 ENCOUNTER — Emergency Department (HOSPITAL_COMMUNITY): Payer: Medicare PPO

## 2015-03-01 DIAGNOSIS — D61818 Other pancytopenia: Secondary | ICD-10-CM | POA: Diagnosis present

## 2015-03-01 DIAGNOSIS — R601 Generalized edema: Secondary | ICD-10-CM | POA: Diagnosis present

## 2015-03-01 DIAGNOSIS — J9600 Acute respiratory failure, unspecified whether with hypoxia or hypercapnia: Secondary | ICD-10-CM | POA: Diagnosis present

## 2015-03-01 DIAGNOSIS — R509 Fever, unspecified: Secondary | ICD-10-CM | POA: Diagnosis present

## 2015-03-01 DIAGNOSIS — I959 Hypotension, unspecified: Secondary | ICD-10-CM | POA: Diagnosis present

## 2015-03-01 DIAGNOSIS — I129 Hypertensive chronic kidney disease with stage 1 through stage 4 chronic kidney disease, or unspecified chronic kidney disease: Secondary | ICD-10-CM | POA: Diagnosis present

## 2015-03-01 DIAGNOSIS — M1990 Unspecified osteoarthritis, unspecified site: Secondary | ICD-10-CM | POA: Diagnosis present

## 2015-03-01 DIAGNOSIS — J449 Chronic obstructive pulmonary disease, unspecified: Secondary | ICD-10-CM | POA: Diagnosis present

## 2015-03-01 DIAGNOSIS — F3130 Bipolar disorder, current episode depressed, mild or moderate severity, unspecified: Secondary | ICD-10-CM | POA: Diagnosis present

## 2015-03-01 DIAGNOSIS — K7290 Hepatic failure, unspecified without coma: Secondary | ICD-10-CM | POA: Diagnosis present

## 2015-03-01 DIAGNOSIS — K7030 Alcoholic cirrhosis of liver without ascites: Secondary | ICD-10-CM | POA: Diagnosis present

## 2015-03-01 DIAGNOSIS — Z8673 Personal history of transient ischemic attack (TIA), and cerebral infarction without residual deficits: Secondary | ICD-10-CM | POA: Diagnosis not present

## 2015-03-01 DIAGNOSIS — Z79899 Other long term (current) drug therapy: Secondary | ICD-10-CM | POA: Diagnosis not present

## 2015-03-01 DIAGNOSIS — Z66 Do not resuscitate: Secondary | ICD-10-CM | POA: Diagnosis present

## 2015-03-01 DIAGNOSIS — Z515 Encounter for palliative care: Secondary | ICD-10-CM

## 2015-03-01 DIAGNOSIS — N183 Chronic kidney disease, stage 3 (moderate): Secondary | ICD-10-CM | POA: Diagnosis present

## 2015-03-01 DIAGNOSIS — A419 Sepsis, unspecified organism: Principal | ICD-10-CM | POA: Diagnosis present

## 2015-03-01 DIAGNOSIS — E119 Type 2 diabetes mellitus without complications: Secondary | ICD-10-CM

## 2015-03-01 DIAGNOSIS — F17210 Nicotine dependence, cigarettes, uncomplicated: Secondary | ICD-10-CM | POA: Diagnosis present

## 2015-03-01 DIAGNOSIS — K219 Gastro-esophageal reflux disease without esophagitis: Secondary | ICD-10-CM | POA: Diagnosis present

## 2015-03-01 DIAGNOSIS — F319 Bipolar disorder, unspecified: Secondary | ICD-10-CM | POA: Diagnosis present

## 2015-03-01 DIAGNOSIS — I10 Essential (primary) hypertension: Secondary | ICD-10-CM | POA: Diagnosis present

## 2015-03-01 DIAGNOSIS — K652 Spontaneous bacterial peritonitis: Secondary | ICD-10-CM

## 2015-03-01 DIAGNOSIS — K7031 Alcoholic cirrhosis of liver with ascites: Secondary | ICD-10-CM | POA: Diagnosis present

## 2015-03-01 DIAGNOSIS — G8929 Other chronic pain: Secondary | ICD-10-CM | POA: Diagnosis present

## 2015-03-01 DIAGNOSIS — R4182 Altered mental status, unspecified: Secondary | ICD-10-CM | POA: Diagnosis present

## 2015-03-01 DIAGNOSIS — Z794 Long term (current) use of insulin: Secondary | ICD-10-CM

## 2015-03-01 DIAGNOSIS — E139 Other specified diabetes mellitus without complications: Secondary | ICD-10-CM | POA: Diagnosis not present

## 2015-03-01 DIAGNOSIS — R0600 Dyspnea, unspecified: Secondary | ICD-10-CM

## 2015-03-01 LAB — CBC WITH DIFFERENTIAL/PLATELET
BASOS ABS: 0 10*3/uL (ref 0.0–0.1)
Basophils Relative: 0 %
Eosinophils Absolute: 0 10*3/uL (ref 0.0–0.7)
Eosinophils Relative: 2 %
HCT: 24.5 % — ABNORMAL LOW (ref 39.0–52.0)
Hemoglobin: 8.2 g/dL — ABNORMAL LOW (ref 13.0–17.0)
LYMPHS ABS: 0.3 10*3/uL — AB (ref 0.7–4.0)
Lymphocytes Relative: 17 %
MCH: 33.3 pg (ref 26.0–34.0)
MCHC: 33.5 g/dL (ref 30.0–36.0)
MCV: 99.6 fL (ref 78.0–100.0)
MONO ABS: 0.1 10*3/uL (ref 0.1–1.0)
MONOS PCT: 5 %
Neutro Abs: 1.6 10*3/uL — ABNORMAL LOW (ref 1.7–7.7)
Neutrophils Relative %: 76 %
Platelets: 58 10*3/uL — ABNORMAL LOW (ref 150–400)
RBC: 2.46 MIL/uL — AB (ref 4.22–5.81)
RDW: 23.2 % — AB (ref 11.5–15.5)
WBC Morphology: INCREASED
WBC: 2 10*3/uL — AB (ref 4.0–10.5)

## 2015-03-01 LAB — COMPREHENSIVE METABOLIC PANEL
ALT: 35 U/L (ref 17–63)
AST: 101 U/L — AB (ref 15–41)
Albumin: 2.4 g/dL — ABNORMAL LOW (ref 3.5–5.0)
Alkaline Phosphatase: 106 U/L (ref 38–126)
Anion gap: 14 (ref 5–15)
BILIRUBIN TOTAL: 3.9 mg/dL — AB (ref 0.3–1.2)
BUN: 52 mg/dL — AB (ref 6–20)
CO2: 18 mmol/L — ABNORMAL LOW (ref 22–32)
Calcium: 8.3 mg/dL — ABNORMAL LOW (ref 8.9–10.3)
Chloride: 101 mmol/L (ref 101–111)
Creatinine, Ser: 2.08 mg/dL — ABNORMAL HIGH (ref 0.61–1.24)
GFR, EST AFRICAN AMERICAN: 38 mL/min — AB (ref >60)
GFR, EST NON AFRICAN AMERICAN: 33 mL/min — AB (ref >60)
Glucose, Bld: 141 mg/dL — ABNORMAL HIGH (ref 65–99)
POTASSIUM: 4.1 mmol/L (ref 3.5–5.1)
Sodium: 133 mmol/L — ABNORMAL LOW (ref 135–145)
TOTAL PROTEIN: 5.8 g/dL — AB (ref 6.5–8.1)

## 2015-03-01 LAB — AMMONIA: AMMONIA: 46 umol/L — AB (ref 9–35)

## 2015-03-01 LAB — I-STAT CG4 LACTIC ACID, ED
LACTIC ACID, VENOUS: 8.26 mmol/L — AB (ref 0.5–2.0)
Lactic Acid, Venous: 9.24 mmol/L (ref 0.5–2.0)

## 2015-03-01 LAB — PROTIME-INR
INR: 2.03 — ABNORMAL HIGH (ref 0.00–1.49)
PROTHROMBIN TIME: 22.8 s — AB (ref 11.6–15.2)

## 2015-03-01 LAB — MAGNESIUM: Magnesium: 1.6 mg/dL — ABNORMAL LOW (ref 1.7–2.4)

## 2015-03-01 LAB — CBG MONITORING, ED: Glucose-Capillary: 143 mg/dL — ABNORMAL HIGH (ref 65–99)

## 2015-03-01 LAB — PHOSPHORUS: Phosphorus: 3.5 mg/dL (ref 2.5–4.6)

## 2015-03-01 MED ORDER — VANCOMYCIN HCL 10 G IV SOLR
2000.0000 mg | INTRAVENOUS | Status: AC
  Administered 2015-03-01: 2000 mg via INTRAVENOUS
  Filled 2015-03-01: qty 2000

## 2015-03-01 MED ORDER — SODIUM CHLORIDE 0.9 % IJ SOLN
3.0000 mL | Freq: Two times a day (BID) | INTRAMUSCULAR | Status: DC
  Administered 2015-03-02 (×3): 3 mL via INTRAVENOUS

## 2015-03-01 MED ORDER — LEVALBUTEROL HCL 1.25 MG/0.5ML IN NEBU
1.2500 mg | INHALATION_SOLUTION | Freq: Four times a day (QID) | RESPIRATORY_TRACT | Status: DC
  Administered 2015-03-02 – 2015-03-03 (×7): 1.25 mg via RESPIRATORY_TRACT
  Filled 2015-03-01 (×8): qty 0.5

## 2015-03-01 MED ORDER — ONDANSETRON HCL 4 MG/2ML IJ SOLN: 4.0000 mg | Freq: Four times a day (QID) | INTRAMUSCULAR | Status: DC | PRN

## 2015-03-01 MED ORDER — SODIUM CHLORIDE 0.9 % IV SOLN
1250.0000 mg | INTRAVENOUS | Status: DC
  Administered 2015-03-02: 1250 mg via INTRAVENOUS
  Filled 2015-03-01 (×2): qty 1250

## 2015-03-01 MED ORDER — SODIUM CHLORIDE 0.9 % IV SOLN
250.0000 mL | INTRAVENOUS | Status: DC | PRN
  Administered 2015-03-02: 250 mL via INTRAVENOUS

## 2015-03-01 MED ORDER — SODIUM CHLORIDE 0.9 % IV BOLUS (SEPSIS)
1000.0000 mL | INTRAVENOUS | Status: AC
  Administered 2015-03-01 (×3): 1000 mL via INTRAVENOUS

## 2015-03-01 MED ORDER — PIPERACILLIN-TAZOBACTAM 3.375 G IVPB
3.3750 g | Freq: Three times a day (TID) | INTRAVENOUS | Status: DC
  Administered 2015-03-02 – 2015-03-03 (×5): 3.375 g via INTRAVENOUS
  Filled 2015-03-01 (×5): qty 50

## 2015-03-01 MED ORDER — SODIUM CHLORIDE 0.9 % IV BOLUS (SEPSIS)
500.0000 mL | INTRAVENOUS | Status: AC
  Administered 2015-03-01: 500 mL via INTRAVENOUS

## 2015-03-01 MED ORDER — LEVALBUTEROL HCL 1.25 MG/0.5ML IN NEBU
1.2500 mg | INHALATION_SOLUTION | Freq: Four times a day (QID) | RESPIRATORY_TRACT | Status: DC
  Administered 2015-03-01: 1.25 mg via RESPIRATORY_TRACT
  Filled 2015-03-01: qty 0.5

## 2015-03-01 MED ORDER — VANCOMYCIN HCL IN DEXTROSE 1-5 GM/200ML-% IV SOLN: 1000.0000 mg | Freq: Once | INTRAVENOUS | Status: DC

## 2015-03-01 MED ORDER — PIPERACILLIN-TAZOBACTAM 3.375 G IVPB 30 MIN
3.3750 g | Freq: Once | INTRAVENOUS | Status: AC
  Administered 2015-03-01: 3.375 g via INTRAVENOUS
  Filled 2015-03-01: qty 50

## 2015-03-01 MED ORDER — IPRATROPIUM BROMIDE 0.02 % IN SOLN
0.5000 mg | Freq: Four times a day (QID) | RESPIRATORY_TRACT | Status: DC
  Administered 2015-03-01 – 2015-03-03 (×8): 0.5 mg via RESPIRATORY_TRACT
  Filled 2015-03-01 (×8): qty 2.5

## 2015-03-01 MED ORDER — SODIUM CHLORIDE 0.9 % IJ SOLN: 3.0000 mL | INTRAMUSCULAR | Status: DC | PRN

## 2015-03-01 MED ORDER — LORAZEPAM 2 MG/ML IJ SOLN: 1.0000 mg | Freq: Four times a day (QID) | INTRAMUSCULAR | Status: DC | PRN

## 2015-03-01 NOTE — ED Notes (Signed)
Hospitalist at the bedside 

## 2015-03-01 NOTE — ED Notes (Signed)
EDP aware of lactic result

## 2015-03-01 NOTE — Progress Notes (Addendum)
ANTIBIOTIC CONSULT NOTE - INITIAL  Pharmacy Consult for Vancomycin and Zosyn Indication: rule out sepsis  No Known Allergies  Patient Measurements: Weight: 245 lb (111.131 kg)  Vital Signs: Temp: 103.9 F (39.9 C) (10/09 1822) Temp Source: Rectal (10/09 1822) BP: 131/61 mmHg (10/09 1822) Pulse Rate: 115 (10/09 1822) Intake/Output from previous day:   Intake/Output from this shift:    Labs: No results for input(s): WBC, HGB, PLT, LABCREA, CREATININE in the last 72 hours. CrCl cannot be calculated (Patient has no serum creatinine result on file.). No results for input(s): VANCOTROUGH, VANCOPEAK, VANCORANDOM, GENTTROUGH, GENTPEAK, GENTRANDOM, TOBRATROUGH, TOBRAPEAK, TOBRARND, AMIKACINPEAK, AMIKACINTROU, AMIKACIN in the last 72 hours.   Microbiology: No results found for this or any previous visit (from the past 720 hour(s)).  Medical History: Past Medical History  Diagnosis Date  . Neuropathy ([place])   . Diabetes mellitus   . Bipolar affect, depressed ([place])   . Hypertension   . Arthritis   . Stroke [facility])     Mini stroke about 69yrs ago  . Cirrhosis ([place])   . Alcohol abuse   . Chronic pain   . Cocaine abuse   . Muscle spasm     both legs  . Encephalopathy, hepatic ([place])   . Detached retina   . COPD (chronic obstructive pulmonary disease) (H[facility])     emphysema  . Bronchitis   . Barrett's esophagus   . GERD (gastroesophageal reflux disease)     has ulcer  . Anemia     Medications:  Scheduled:   Infusions:  . piperacillin-tazobactam    . sodium chloride     Followed by  . sodium chloride    . vancomycin     PRN:   Assessment: 59 yo male presents from SNF with AMS and temp 102. S/p paracentesis 9/29. Code Sepsis called and pharmacy consulted to dose vancomycin and Zosyn.  10/9 >> Vanc >> 10/9 >> Zosyn >>  10/9 blood x 2: sent 10/9 urine: sent  Goal of Therapy:  Vancomycin trough level 15-20 mcg/ml  Zosyn dose appropriate for renal function  Plan:    Vancomycin 2g IV x 1  Zosyn 3.375g IV x 1  Follow up labs for maintenance doses  Peggyann Juba, PharmD, BCPS Pager: [phone] 03/05/2015,7:22 PM  Plan:  Vancomycin 2 gm x1 then 1250mg  IV q24h  Zosyn 3.375 Gm IV q8h EI  F/u SCr/cultures/levels  Dorrene German 02/25/2015 8:28 PM

## 2015-03-01 NOTE — ED Provider Notes (Signed)
CSN: [id number]     Arrival date & time 03/21/2015  1805 History   First MD Initiated Contact with Patient 03/05/2015 1828     Chief Complaint  Patient presents with  . Altered Mental Status  . Code Sepsis     (Consider location/radiation/quality/duration/timing/severity/associated sxs/prior Treatment) Patient is a 59 y.o. male presenting with altered mental status. The history is provided by the patient, the EMS personnel and the nursing home. The history is limited by the condition of the patient.  Altered Mental Status Presenting symptoms: confusion   Associated symptoms: fever    patient is alert and will follow commands but seems to difficulty answering questions. Patient brought in by [facility] rehabilitation facility for fever arrived by EMS as a code sepsis patient. Patient was tachycardic fever here up to 103.9. No hypotension. At the rehabilitation facility respiratory rate was elevated but it was 24 upon arrival here. Patient with sats in the upper 90s on room air. Patient has end-stage cirrhosis and ascites. Patient is a DO NOT RESUSCITATE. Patient had admission in September for sepsis that was believed to be related to seeding of the ascites fluid. Patient scheduled for paracentesis tomorrow.  Past Medical History  Diagnosis Date  . Neuropathy ([place])   . Diabetes mellitus   . Bipolar affect, depressed ([place])   . Hypertension   . Arthritis   . Stroke [facility])     Mini stroke about 65yrs ago  . Cirrhosis ([place])   . Alcohol abuse   . Chronic pain   . Cocaine abuse   . Muscle spasm     both legs  . Encephalopathy, hepatic ([place])   . Detached retina   . COPD (chronic obstructive pulmonary disease) (HCC)     emphysema  . Bronchitis   . Barrett's esophagus   . GERD (gastroesophageal reflux disease)     has ulcer  . Anemia    Past Surgical History  Procedure Laterality Date  . Fracture surgery      Leg and arm 51yrs ago  . Esophagogastroduodenoscopy  04/04/2012    Procedure:  ESOPHAGOGASTRODUODENOSCOPY (EGD);  Surgeon: Irene Shipper, MD;  Location: [facility] END[facility]COPY;  Service: Endoscopy;  Laterality: N/A;  . Esophagogastroduodenoscopy Left 03/13/2013    Procedure: ESOPHAGOGASTRODUODENOSCOPY (EGD);  Surgeon: Arta Silence, MD;  Location: [facility] ENDOSCOPY;  Service: Endoscopy;  Laterality: Left;  Marland Kitchen Eye surgery  8 months ago both eyes    cataracts both eyes, detached eye, gas pocket  . Vasectomy    . Pars plana vitrectomy Left 07/08/2013    Procedure: PARS PLANA VITRECTOMY WITH 25 GAUGE;  Surgeon: Hurman Horn, MD;  Location: [place];  Service: Ophthalmology;  Laterality: Left;  . Intraocular lens removal Left 07/08/2013    Procedure: REMOVAL OF INTRAOCULAR LENS;  Surgeon: Hurman Horn, MD;  Location: [place];  Service: Ophthalmology;  Laterality: Left;  . Placement and suture of secondary intraocular lens Left 07/08/2013    Procedure: PLACEMENT AND SUTURE OF SECONDARY INTRAOCULAR LENS;  Surgeon: Hurman Horn, MD;  Location: [place];  Service: Ophthalmology;  Laterality: Left;  Insertion of Anterior Capsule Intraocular Lens   . Esophagogastroduodenoscopy N/A 01/16/2014    Procedure: ESOPHAGOGASTRODUODENOSCOPY (EGD);  Surgeon: Winfield Cunas., MD;  Location: [facility] ENDOSCOPY;  Service: Endoscopy;  Laterality: N/A;  . Colonoscopy N/A 01/17/2014    Procedure: COLONOSCOPY;  Surgeon: Winfield Cunas., MD;  Location: [facility] ENDOSCOPY;  Service: Endoscopy;  Laterality: N/A;  possible banding  . Esophagogastroduodenoscopy N/A  08/30/2014    Procedure: ESOPHAGOGASTRODUODENOSCOPY (EGD);  Surgeon: Wilford Corner, MD;  Location: [facility] ENDOSCOPY;  Service: Endoscopy;  Laterality: N/A;  bedside  . Esophagogastroduodenoscopy N/A 11/30/2014    Procedure: ESOPHAGOGASTRODUODENOSCOPY (EGD);  Surgeon: Wilford Corner, MD;  Location: [facility] ENDOSCOPY;  Service: Endoscopy;  Laterality: N/A;  . Esophagogastroduodenoscopy N/A 12/15/2014    Procedure: ESOPHAGOGASTRODUODENOSCOPY (EGD);  Surgeon: Teena Irani, MD;  Location:  [facility] ENDOSCOPY;  Service: Endoscopy;  Laterality: N/A;   Family History  Problem Relation Age of Onset  . Hypotension Mother    Social History  Substance Use Topics  . Smoking status: Current Every Day Smoker -- 1.00 packs/day for 30 years    Types: Cigarettes  . Smokeless tobacco: Never Used  . Alcohol Use: 0.0 oz/week     Comment: 12 pk beer daily  06/2013 - no alcohol since 11/2012    Review of Systems  Unable to perform ROS Constitutional: Positive for fever.  Psychiatric/Behavioral: Positive for confusion.   level V caveat applies due to patient's baseline confusion. Patient is alert and will follow commands but seems to be having difficulty answering questions.    Allergies  Review of patient's allergies indicates no known allergies.  Home Medications   Prior to Admission medications   Medication Sig Start Date End Date Taking? Authorizing Provider  Amino Acids-Protein Hydrolys (FEEDING SUPPLEMENT, PRO-STAT SUGAR FREE 64,) LIQD Take 30 mLs by mouth 2 (two) times daily. 01/23/15  Yes Robbie Lis, MD  amitriptyline (ELAVIL) 10 MG tablet Take 1 tablet (10 mg total) by mouth at bedtime. 02/01/15  Yes Janece Canterbury, MD  Cholecalciferol (VITAMIN D PO) Take 5,000 Units by mouth daily.   Yes Historical Provider, MD  clonazePAM (KLONOPIN) 0.25 MG disintegrating tablet Take 0.25 mg by mouth 2 (two) times daily as needed (anxiety).   Yes Historical Provider, MD  diclofenac sodium (VOLTAREN) 1 % GEL Apply 4 g topically daily.    Yes Historical Provider, MD  ferrous sulfate 325 (65 FE) MG tablet Take 1 tablet (325 mg total) by mouth 2 (two) times daily with a meal. 02/01/15  Yes Janece Canterbury, MD  folic acid (FOLVITE) 1 MG tablet Take 1 tablet (1 mg total) by mouth daily. 10/05/14  Yes Nita Sells, MD  furosemide (LASIX) 80 MG tablet Take 1 tablet (80 mg total) by mouth 2 (two) times daily. 02/01/15  Yes Janece Canterbury, MD  hydrocortisone cream 1 % Apply 1 application topically 2  (two) times daily as needed for itching.   Yes Historical Provider, MD  hydrOXYzine (ATARAX/VISTARIL) 50 MG tablet Take 1 tablet (50 mg total) by mouth at bedtime. 02/01/15  Yes Janece Canterbury, MD  lactulose (CHRONULAC) 10 GM/15ML solution Take 45 mLs (30 g total) by mouth 2 (two) times daily. 02/01/15  Yes Janece Canterbury, MD  magnesium oxide (MAG-OX) 400 (241.3 MG) MG tablet Take 1 tablet (400 mg total) by mouth 2 (two) times daily. 09/03/14  Yes Reyne Dumas, MD  midodrine (PROAMATINE) 10 MG tablet Take 1 tablet (10 mg total) by mouth 3 (three) times daily with meals. 02/01/15  Yes Janece Canterbury, MD  Multiple Vitamin (MULTIVITAMIN WITH MINERALS) TABS tablet Take 1 tablet by mouth daily. 07/31/14  Yes Geradine Girt, DO  multivitamin-iron-minerals-folic acid (THERAPEUTIC-M) TABS tablet Take 1 tablet by mouth daily.   Yes Historical Provider, MD  ondansetron (ZOFRAN-ODT) 4 MG disintegrating tablet Take 4 mg by mouth every 8 (eight) hours as needed for nausea or vomiting.   Yes Historical Provider,  MD  oxyCODONE (OXY IR/ROXICODONE) 5 MG immediate release tablet Take 1 tablet (5 mg total) by mouth every 6 (six) hours as needed for moderate pain or severe pain. 02/01/15  Yes Janece Canterbury, MD  pantoprazole (PROTONIX) 40 MG tablet Take 1 tablet (40 mg total) by mouth daily. 01/23/15  Yes Robbie Lis, MD  rifaximin (XIFAXAN) 550 MG TABS tablet Take 1 tablet (550 mg total) by mouth 2 (two) times daily. 01/23/15  Yes Robbie Lis, MD  thiamine (VITAMIN B-1) 100 MG tablet Take 100 mg by mouth daily.   Yes Historical Provider, MD  venlafaxine (EFFEXOR) 25 MG tablet Take 25 mg by mouth 2 (two) times daily.   Yes Historical Provider, MD  collagenase (SANTYL) ointment Apply topically daily. Patient not taking: Reported on 03/17/2015 01/23/15   Robbie Lis, MD  doxycycline (VIBRA-TABS) 100 MG tablet Take 1 tablet (100 mg total) by mouth 2 (two) times daily. Patient not taking: Reported on 03/10/2015 02/01/15    Janece Canterbury, MD  feeding supplement (BOOST / RESOURCE BREEZE) LIQD Take 1 Container by mouth 3 (three) times daily between meals. Patient not taking: Reported on 02/27/2015 12/20/14   Thurnell Lose, MD  ondansetron (ZOFRAN-ODT) 4 MG disintegrating tablet Take 1 tablet (4 mg total) by mouth every 8 (eight) hours as needed for nausea or vomiting. 02/01/15   Janece Canterbury, MD  potassium chloride SA (K-DUR,KLOR-CON) 20 MEQ tablet Take 1 tablet (20 mEq total) by mouth daily. 02/01/15   Janece Canterbury, MD  venlafaxine (EFFEXOR) 37.5 MG tablet Take 1 tablet (37.5 mg total) by mouth 2 (two) times daily with a meal. Patient not taking: Reported on 02/23/2015 02/02/15   Janece Canterbury, MD   BP 143/88 mmHg  Pulse 114  Temp(Src) 103.9 F (39.9 C) (Rectal)  Resp 21  Wt 245 lb (111.131 kg)  SpO2 95% Physical Exam  Constitutional: He is oriented to person, place, and time. He appears well-developed and well-nourished. No distress.  HENT:  Head: Normocephalic and atraumatic.  Mouth/Throat: Oropharynx is clear and moist.  Eyes: Conjunctivae and EOM are normal. Pupils are equal, round, and reactive to light.  Neck: Normal range of motion. Neck supple.  Cardiovascular: Regular rhythm.   Tachycardic  Pulmonary/Chest: Effort normal and breath sounds normal.  Abdominal: Soft. Bowel sounds are normal. He exhibits distension. There is no tenderness.  Marked distention consistent with ascites.  Musculoskeletal: Normal range of motion. He exhibits edema.  Mark bilateral lower extremity edema. No upper extremity edema.  Neurological: He is alert and oriented to person, place, and time. No cranial nerve deficit. He exhibits normal muscle tone. Coordination normal.  Skin: Skin is warm. No rash noted.  Nursing note and vitals reviewed.   ED Course  Procedures (including critical care time) Labs Review Labs Reviewed  COMPREHENSIVE METABOLIC PANEL - Abnormal; Notable for the following:    Sodium 133 (*)     CO2 18 (*)    Glucose, Bld 141 (*)    BUN 52 (*)    Creatinine, Ser 2.08 (*)    Calcium 8.3 (*)    Total Protein 5.8 (*)    Albumin 2.4 (*)    AST 101 (*)    Total Bilirubin 3.9 (*)    GFR calc non Af Amer 33 (*)    GFR calc Af Amer 38 (*)    All other components within normal limits  CBC WITH DIFFERENTIAL/PLATELET - Abnormal; Notable for the following:    WBC 2.0 (*)  RBC 2.46 (*)    Hemoglobin 8.2 (*)    HCT 24.5 (*)    RDW 23.2 (*)    Platelets 58 (*)    All other components within normal limits  PROTIME-INR - Abnormal; Notable for the following:    Prothrombin Time 22.8 (*)    INR 2.03 (*)    All other components within normal limits  I-STAT CG4 LACTIC ACID, ED - Abnormal; Notable for the following:    Lactic Acid, Venous 8.26 (*)    All other components within normal limits  CBG MONITORING, ED - Abnormal; Notable for the following:    Glucose-Capillary 143 (*)    All other components within normal limits  CULTURE, BLOOD (ROUTINE X 2)  CULTURE, BLOOD (ROUTINE X 2)  URINE CULTURE  URINALYSIS, ROUTINE W REFLEX MICROSCOPIC (NOT AT [facility])   Results for orders placed or performed during the hospital encounter of 03/23/2015  Comprehensive metabolic panel  Result Value Ref Range   Sodium 133 (L) 135 - 145 mmol/L   Potassium 4.1 3.5 - 5.1 mmol/L   Chloride 101 101 - 111 mmol/L   CO2 18 (L) 22 - 32 mmol/L   Glucose, Bld 141 (H) 65 - 99 mg/dL   BUN 52 (H) 6 - 20 mg/dL   Creatinine, Ser 2.08 (H) 0.61 - 1.24 mg/dL   Calcium 8.3 (L) 8.9 - 10.3 mg/dL   Total Protein 5.8 (L) 6.5 - 8.1 g/dL   Albumin 2.4 (L) 3.5 - 5.0 g/dL   AST 101 (H) 15 - 41 U/L   ALT 35 17 - 63 U/L   Alkaline Phosphatase 106 38 - 126 U/L   Total Bilirubin 3.9 (H) 0.3 - 1.2 mg/dL   GFR calc non Af Amer 33 (L) >60 mL/min   GFR calc Af Amer 38 (L) >60 mL/min   Anion gap 14 5 - 15  CBC WITH DIFFERENTIAL  Result Value Ref Range   WBC 2.0 (L) 4.0 - 10.5 K/uL   RBC 2.46 (L) 4.22 - 5.81 MIL/uL    Hemoglobin 8.2 (L) 13.0 - 17.0 g/dL   HCT 24.5 (L) 39.0 - 52.0 %   MCV 99.6 78.0 - 100.0 fL   MCH 33.3 26.0 - 34.0 pg   MCHC 33.5 30.0 - 36.0 g/dL   RDW 23.2 (H) 11.5 - 15.5 %   Platelets 58 (L) 150 - 400 K/uL   Neutrophils Relative % PENDING %   Neutro Abs PENDING 1.7 - 7.7 K/uL   Band Neutrophils PENDING %   Lymphocytes Relative PENDING %   Lymphs Abs PENDING 0.7 - 4.0 K/uL   Monocytes Relative PENDING %   Monocytes Absolute PENDING 0.1 - 1.0 K/uL   Eosinophils Relative PENDING %   Eosinophils Absolute PENDING 0.0 - 0.7 K/uL   Basophils Relative PENDING %   Basophils Absolute PENDING 0.0 - 0.1 K/uL   WBC Morphology PENDING    RBC Morphology PENDING    Smear Review PENDING    nRBC PENDING 0 /100 WBC   Metamyelocytes Relative PENDING %   Myelocytes PENDING %   Promyelocytes Absolute PENDING %   Blasts PENDING %  Protime-INR  Result Value Ref Range   Prothrombin Time 22.8 (H) 11.6 - 15.2 seconds   INR 2.03 (H) 0.00 - 1.49  I-Stat CG4 Lactic Acid, ED  (not at  [facility])  Result Value Ref Range   Lactic Acid, Venous 8.26 (HH) 0.5 - 2.0 mmol/L   Comment NOTIFIED PHYSICIAN   CBG  monitoring, ED  Result Value Ref Range   Glucose-Capillary 143 (H) 65 - 99 mg/dL     Imaging Review Dg Chest Port 1 View  03/10/2015   CLINICAL DATA:  Clinical suspicion of sepsis.  EXAM: PORTABLE CHEST 1 VIEW  COMPARISON:  01/25/2015  FINDINGS: Cardiomediastinal silhouette is prominent, and may be exaggerated by portable technique. Mediastinal contours appear intact.  There is no evidence of focal airspace consolidation, pleural effusion or pneumothorax. Lung volumes are low.  Osseous structures are without acute abnormality. Soft tissues are grossly normal.  IMPRESSION: Low lung volumes.  Mild enlargement of the cardiac silhouette.   Electronically Signed   By: Fidela Salisbury M.D.   On: 03/17/2015 18:51   I have personally reviewed and evaluated these images and lab results as part of my medical  decision-making.   EKG Interpretation   Date/Time:  Sunday March 01 2015 18:23:12 EDT Ventricular Rate:  112 PR Interval:  143 QRS Duration: 89 QT Interval:  363 QTC Calculation: 495 R Axis:   9 Text Interpretation:  Sinus tachycardia Borderline T abnormalities,  inferior leads Borderline prolonged QT interval Confirmed by Fatuma Dowers   MD, Latrelle Fuston [id number]) on 02/21/2015 6:29:13 PM       CRITICAL CARE Performed by: Fredia Sorrow Total critical care time: 30 Critical care time was exclusive of separately billable procedures and treating other patients. Critical care was necessary to treat or prevent imminent or life-threatening deterioration. Critical care was time spent personally by me on the following activities: development of treatment plan with patient and/or surrogate as well as nursing, discussions with consultants, evaluation of patient's response to treatment, examination of patient, obtaining history from patient or surrogate, ordering and performing treatments and interventions, ordering and review of laboratory studies, ordering and review of radiographic studies, pulse oximetry and re-evaluation of patient's condition.    MDM   Final diagnoses:  Sepsis, due to unspecified organism [facility])    Patient from [place] rehabilitation facility. Patient is a DO NOT RESUSCITATE. Patient brought in by EMS as a code sepsis. Patient with a temp of 102 respiratory rate 30 and tachycardic with heart rate around 1:15. Upon arrival patient's temp was 103.9 sinus tach at 1:15 wrist 3 rate 24 blood pressure 131/61.  Patient workup without dystrophic source most likely the ascites is the most likely source of the sepsis. Chest x-ray was negative patient actually has leukopenia. Renal insufficiency is pretty much baseline. Lactic acid significant elevated 8. Patient started on septic protocol labs orders and antibiotics. The patient currently is received 2 L of fluid still  tachycardic around 110 blood pressure still normal. Patient without any specific complaints.  Patient not in any respiratory distress. Discussed with hospitalist they will admit. Patient is scheduled for a paracentesis tomorrow. Patient's already been evaluated by palliative care service during his admission in September.    Fredia Sorrow, MD 03/18/2015 2103

## 2015-03-01 NOTE — ED Notes (Signed)
Bed: WA01 Expected date: 02/22/2015 Expected time: 6:06 PM Means of arrival: Ambulance Comments: Ascities, AMS

## 2015-03-01 NOTE — ED Notes (Signed)
Pt presents from [place] rehab, called in as a code sepsis after meeting medics' sepsis criteria, temp 102F Oral, RR 30, Altered mental status, recent invasive procedure (paracentistis for ascites). ED code sepsis activated.

## 2015-03-01 NOTE — H&P (Signed)
[facility] History and Physical  [name]:[id number] DOB: Apr 13, 1956 DOA: 03/04/2015  Referring physician: Fredia Sorrow, MD PCP: Barbette Merino, MD   Chief Complaint: Change in mental status.  HPI: [name] is a 59 y.o. 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 Patient had initial temperature of 103.34F in the emergency department, was mildly tachypneic in the mid to high 20s and tachycardic in the 110s. Patient is unable to provide significant history, but he was admitted last month due to a similar picture.  When seen, the patient was febrile, tachycardic and mildly tachypneic. He was confused.  Review of Systems:  Unable to obtain  Past Medical History  Diagnosis Date  . Neuropathy ([place])   . Diabetes mellitus   . Bipolar affect, depressed ([place])   . Hypertension   . Arthritis   . Stroke [facility])     Mini stroke about 52yrs ago  . Cirrhosis ([place])   . Alcohol abuse   . Chronic pain   . Cocaine abuse   . Muscle spasm     both legs  . Encephalopathy, hepatic ([place])   . Detached retina   . COPD (chronic obstructive pulmonary disease) (HCC)     emphysema  . Bronchitis   . Barrett's esophagus   . GERD (gastroesophageal reflux disease)     has ulcer  . Anemia    Past Surgical History  Procedure Laterality Date  . Fracture surgery      Leg and arm 6yrs ago  . Esophagogastroduodenoscopy  04/04/2012    Procedure: ESOPHAGOGASTRODUODENOSCOPY (EGD);  Surgeon: Irene Shipper, MD;  Location: [facility] ENDOSCOPY;  Service: Endoscopy;  Laterality: N/A;  . Esophagogastroduodenoscopy Left 03/13/2013    Procedure: ESOPHAGOGASTRODUODENOSCOPY (EGD);  Surgeon: Arta Silence, MD;   Location: [facility] ENDOSCOPY;  Service: Endoscopy;  Laterality: Left;  Marland Kitchen Eye surgery  8 months ago both eyes    cataracts both eyes, detached eye, gas pocket  . Vasectomy    . Pars plana vitrectomy Left 07/08/2013    Procedure: PARS PLANA VITRECTOMY WITH 25 GAUGE;  Surgeon: Hurman Horn, MD;  Location: [place];  Service: Ophthalmology;  Laterality: Left;  . Intraocular lens removal Left 07/08/2013    Procedure: REMOVAL OF INTRAOCULAR LENS;  Surgeon: Hurman Horn, MD;  Location: [place];  Service: Ophthalmology;  Laterality: Left;  . Placement and suture of secondary intraocular lens Left 07/08/2013    Procedure: PLACEMENT AND SUTURE OF SECONDARY INTRAOCULAR LENS;  Surgeon: Hurman Horn, MD;  Location: [place];  Service: Ophthalmology;  Laterality: Left;  Insertion of Anterior Capsule Intraocular Lens   . Esophagogastroduodenoscopy N/A 01/16/2014    Procedure: ESOPHAGOGASTRODUODENOSCOPY (EGD);  Surgeon: Winfield Cunas., MD;  Location: [facility] ENDOSCOPY;  Service: Endoscopy;  Laterality: N/A;  . Colonoscopy N/A 01/17/2014    Procedure: COLONOSCOPY;  Surgeon: Winfield Cunas., MD;  Location: [facility] ENDOSCOPY;  Service: Endoscopy;  Laterality: N/A;  possible banding  . Esophagogastroduodenoscopy N/A 08/30/2014    Procedure: ESOPHAGOGASTRODUODENOSCOPY (EGD);  Surgeon: Wilford Corner, MD;  Location: [facility] ENDOSCOPY;  Service: Endoscopy;  Laterality: N/A;  bedside  . Esophagogastroduodenoscopy N/A 11/30/2014    Procedure: ESOPHAGOGASTRODUODENOSCOPY (EGD);  Surgeon: Wilford Corner, MD;  Location: [facility] ENDOSCOPY;  Service: Endoscopy;  Laterality: N/A;  . Esophagogastroduodenoscopy N/A 12/15/2014    Procedure: ESOPHAGOGASTRODUODENOSCOPY (EGD);  Surgeon: Teena Irani, MD;  Location: [facility] ENDOSCOPY;  Service: Endoscopy;  Laterality: N/A;   Social History:  reports that he has been smoking Cigarettes.  He has a 30 pack-year smoking history. He has never used smokeless tobacco. He reports that he drinks alcohol. He reports that he  uses illicit drugs (Cocaine).  No Known Allergies  Family History  Problem Relation Age of Onset  . Hypotension Mother     Prior to Admission medications   Medication Sig Start Date End Date Taking? Authorizing Provider  Amino Acids-Protein Hydrolys (FEEDING SUPPLEMENT, PRO-STAT SUGAR FREE 64,) LIQD Take 30 mLs by mouth 2 (two) times daily. 01/23/15  Yes Robbie Lis, MD  amitriptyline (ELAVIL) 10 MG tablet Take 1 tablet (10 mg total) by mouth at bedtime. 02/01/15  Yes Janece Canterbury, MD  Cholecalciferol (VITAMIN D PO) Take 5,000 Units by mouth daily.   Yes Historical Provider, MD  clonazePAM (KLONOPIN) 0.25 MG disintegrating tablet Take 0.25 mg by mouth 2 (two) times daily as needed (anxiety).   Yes Historical Provider, MD  diclofenac sodium (VOLTAREN) 1 % GEL Apply 4 g topically daily.    Yes Historical Provider, MD  ferrous sulfate 325 (65 FE) MG tablet Take 1 tablet (325 mg total) by mouth 2 (two) times daily with a meal. 02/01/15  Yes Janece Canterbury, MD  folic acid (FOLVITE) 1 MG tablet Take 1 tablet (1 mg total) by mouth daily. 10/05/14  Yes Nita Sells, MD  furosemide (LASIX) 80 MG tablet Take 1 tablet (80 mg total) by mouth 2 (two) times daily. 02/01/15  Yes Janece Canterbury, MD  hydrocortisone cream 1 % Apply 1 application topically 2 (two) times daily as needed for itching.   Yes Historical Provider, MD  hydrOXYzine (ATARAX/VISTARIL) 50 MG tablet Take 1 tablet (50 mg total) by mouth at bedtime. 02/01/15  Yes Janece Canterbury, MD  lactulose (CHRONULAC) 10 GM/15ML solution Take 45 mLs (30 g total) by mouth 2 (two) times daily. 02/01/15  Yes Janece Canterbury, MD  magnesium oxide (MAG-OX) 400 (241.3 MG) MG tablet Take 1 tablet (400 mg total) by mouth 2 (two) times daily. 09/03/14  Yes Reyne Dumas, MD  midodrine (PROAMATINE) 10 MG tablet Take 1 tablet (10 mg total) by mouth 3 (three) times daily with meals. 02/01/15  Yes Janece Canterbury, MD  Multiple Vitamin (MULTIVITAMIN WITH MINERALS)  TABS tablet Take 1 tablet by mouth daily. 07/31/14  Yes Geradine Girt, DO  multivitamin-iron-minerals-folic acid (THERAPEUTIC-M) TABS tablet Take 1 tablet by mouth daily.   Yes Historical Provider, MD  ondansetron (ZOFRAN-ODT) 4 MG disintegrating tablet Take 4 mg by mouth every 8 (eight) hours as needed for nausea or vomiting.   Yes Historical Provider, MD  oxyCODONE (OXY IR/ROXICODONE) 5 MG immediate release tablet Take 1 tablet (5 mg total) by mouth every 6 (six) hours as needed for moderate pain or severe pain. 02/01/15  Yes Janece Canterbury, MD  pantoprazole (PROTONIX) 40 MG tablet Take 1 tablet (40 mg total) by mouth daily. 01/23/15  Yes Robbie Lis, MD  rifaximin (XIFAXAN) 550 MG TABS tablet Take 1 tablet (550 mg total) by mouth 2 (two) times daily. 01/23/15  Yes Robbie Lis, MD  thiamine (VITAMIN B-1) 100 MG tablet Take 100 mg by mouth daily.   Yes Historical Provider, MD  venlafaxine (EFFEXOR) 25 MG tablet Take 25 mg by mouth 2 (two) times daily.   Yes  Historical Provider, MD  collagenase (SANTYL) ointment Apply topically daily. Patient not taking: Reported on 03/10/2015 01/23/15   Robbie Lis, MD  doxycycline (VIBRA-TABS) 100 MG tablet Take 1 tablet (100 mg total) by mouth 2 (two) times daily. Patient not taking: Reported on 02/27/2015 02/01/15   Janece Canterbury, MD  feeding supplement (BOOST / RESOURCE BREEZE) LIQD Take 1 Container by mouth 3 (three) times daily between meals. Patient not taking: Reported on 03/21/2015 12/20/14   Thurnell Lose, MD  ondansetron (ZOFRAN-ODT) 4 MG disintegrating tablet Take 1 tablet (4 mg total) by mouth every 8 (eight) hours as needed for nausea or vomiting. 02/01/15   Janece Canterbury, MD  potassium chloride SA (K-DUR,KLOR-CON) 20 MEQ tablet Take 1 tablet (20 mEq total) by mouth daily. 02/01/15   Janece Canterbury, MD  venlafaxine (EFFEXOR) 37.5 MG tablet Take 1 tablet (37.5 mg total) by mouth 2 (two) times daily with a meal. Patient not taking: Reported on  03/07/2015 02/02/15   Janece Canterbury, MD   Physical Exam: Filed Vitals:   03/20/2015 2200 03/19/2015 2220 02/24/2015 2304 03/18/2015 2305  BP: 123/62     Pulse: 119  118 118  Temp:      TempSrc:      Resp: 28  24 24   Weight:      SpO2: 100% 100% 95% 95%    Wt Readings from Last 3 Encounters:  03/13/2015 111.131 kg (245 lb)  02/02/15 111.267 kg (245 lb 4.8 oz)  01/22/15 121.927 kg (268 lb 12.8 oz)    General:  Febrile and confused.  Eyes: PERRL, positive icterus ENT: grossly normal hearing, lips & tongue are dry. Neck: no LAD, masses or thyromegaly Cardiovascular: Tachycardic with significant lower extremities edema. Telemetry: Sinus tachycardia. Respiratory: Decreased breath sounds bilaterally, positive wheezing.. Abdomen: Distended due to ascites, diffuse abdominal pain, no guarding or rebound tenderness. Skin: Positive areas of ecchymoses. Musculoskeletal: Anasarca. Psychiatric: Confused. Neurologic: Confused only oriented to name.           Labs on Admission:  Basic Metabolic Panel:  Recent Labs Lab 03/04/2015 1904 03/14/2015 2212  NA 133*  --   K 4.1  --   CL 101  --   CO2 18*  --   GLUCOSE 141*  --   BUN 52*  --   CREATININE 2.08*  --   CALCIUM 8.3*  --   MG  --  1.6*  PHOS  --  3.5   Liver Function Tests:  Recent Labs Lab 03/10/2015 1904  AST 101*  ALT 35  ALKPHOS 106  BILITOT 3.9*  PROT 5.8*  ALBUMIN 2.4*    Recent Labs Lab 03/08/2015 2154  AMMONIA 46*   CBC:  Recent Labs Lab 03/08/2015 1904  WBC 2.0*  NEUTROABS 1.6*  HGB 8.2*  HCT 24.5*  MCV 99.6  PLT 58*    BNP (last 3 results)  Recent Labs  11/29/14 1145 01/15/15 0010 01/17/15 1815  BNP 29.4 46.4 75.3    CBG:  Recent Labs Lab 03/15/2015 1838  GLUCAP 143*    Radiological Exams on Admission: Dg Chest Port 1 View  02/25/2015   CLINICAL DATA:  Clinical suspicion of sepsis.  EXAM: PORTABLE CHEST 1 VIEW  COMPARISON:  01/25/2015  FINDINGS: Cardiomediastinal silhouette is prominent,  and may be exaggerated by portable technique. Mediastinal contours appear intact.  There is no evidence of focal airspace consolidation, pleural effusion or pneumothorax. Lung volumes are low.  Osseous structures are without acute abnormality. Soft tissues are  grossly normal.  IMPRESSION: Low lung volumes.  Mild enlargement of the cardiac silhouette.   Electronically Signed   By: Fidela Salisbury M.D.   On: 03/13/2015 18:51    Echocardiogram: 08/15/2014 ------------------------------------------------------------------- LV EF: 60% -  65%  ------------------------------------------------------------------- Indications:   Hypertension (I10).  ------------------------------------------------------------------- History:  PMH: Alcohol Abuse. Thrombocytopenia. Altered mental status. Risk factors: Current tobacco use. Hypertension. Diabetes mellitus.  ------------------------------------------------------------------- Study Conclusions  - Left ventricle: The cavity size was moderately dilated. Systolic function was normal. The estimated ejection fraction was in the range of 60% to 65%. Doppler parameters are consistent with abnormal left ventricular relaxation (grade 1 diastolic dysfunction). - Aortic valve: Valve area (VTI): 1.83 cm^2. Valve area (Vmax): 1.76 cm^2. Valve area (Vmean): 1.73 cm^2. - Left atrium: The atrium was mildly dilated.     EKG: Independently reviewed. Vent. rate 112 BPM PR interval 143 ms QRS duration 89 ms QT/QTc 363/495 ms P-R-T axes 84 9 -2 Sinus tachycardia Borderline T abnormalities, inferior leads Borderline prolonged QT interval  Assessment/Plan Principal Problem:    Sepsis ([place]) Admit to a stepdown for closer monitoring. Continue IV antibiotic therapy. Follow-up blood cultures and sensitivity. The patient is scheduled for diagnostic/therapeutic paracentesis tomorrow morning. Clinical picture is suspicious for spontaneous  bacterial peritonitis. Prognosis is poor.   Active Problems:     Altered mental status Secondary to fever, mildly increased ammonia and sepsis. Continue treatment as above.      HTN (hypertension) Hold antihypertensive therapy, since the patient will be getting paracentesis in the morning.      Diabetes mellitus ([place]) CBG monitoring and regular insulin sliding scale      Bipolar affect, depressed ([place]) Resume psychotropic medications once paracentesis has been done, if the patient is able to tolerate oral intake.      Pancytopenia (HCC) Monitor CBC.      Anasarca     ALC (alcoholic liver cirrhosis) (HCC) Continue palliative measures. At this time his Child Pugh score is 15. Long-term prognosis is poor.   I consulted Dr. Aletta Edouard  From IR for diagnostic/therapeutic paracentesis in the morning.  Code Status: DNR/DNI. DVT Prophylaxis: SCDs. Family Communication:  Disposition Plan: Admit to stepdown for IV antibiotic therapy, diagnostic/therapeutic paracentesis in the morning.  Time spent: Over 90 minutes were spent during the process of his admission.  Reubin Milan [facility] Pager [phone].

## 2015-03-02 ENCOUNTER — Inpatient Hospital Stay (HOSPITAL_COMMUNITY): Payer: Medicare PPO

## 2015-03-02 ENCOUNTER — Ambulatory Visit (HOSPITAL_COMMUNITY): Admission: RE | Admit: 2015-03-02 | Payer: Medicare PPO | Source: Ambulatory Visit

## 2015-03-02 DIAGNOSIS — E139 Other specified diabetes mellitus without complications: Secondary | ICD-10-CM

## 2015-03-02 DIAGNOSIS — R4182 Altered mental status, unspecified: Secondary | ICD-10-CM

## 2015-03-02 LAB — LACTATE DEHYDROGENASE, PLEURAL OR PERITONEAL FLUID: LD FL: 34 U/L — AB (ref 3–23)

## 2015-03-02 LAB — URINALYSIS, ROUTINE W REFLEX MICROSCOPIC
Bilirubin Urine: NEGATIVE
Glucose, UA: NEGATIVE mg/dL
Hgb urine dipstick: NEGATIVE
Ketones, ur: NEGATIVE mg/dL
LEUKOCYTES UA: NEGATIVE
NITRITE: NEGATIVE
PROTEIN: NEGATIVE mg/dL
Specific Gravity, Urine: 1.016 (ref 1.005–1.030)
UROBILINOGEN UA: 0.2 mg/dL (ref 0.0–1.0)
pH: 5 (ref 5.0–8.0)

## 2015-03-02 LAB — COMPREHENSIVE METABOLIC PANEL
ALBUMIN: 2 g/dL — AB (ref 3.5–5.0)
ALK PHOS: 64 U/L (ref 38–126)
ALT: 32 U/L (ref 17–63)
ANION GAP: 19 — AB (ref 5–15)
AST: 102 U/L — ABNORMAL HIGH (ref 15–41)
BILIRUBIN TOTAL: 3.4 mg/dL — AB (ref 0.3–1.2)
BUN: 53 mg/dL — ABNORMAL HIGH (ref 6–20)
CALCIUM: 7.6 mg/dL — AB (ref 8.9–10.3)
CO2: 10 mmol/L — ABNORMAL LOW (ref 22–32)
CREATININE: 2.44 mg/dL — AB (ref 0.61–1.24)
Chloride: 106 mmol/L (ref 101–111)
GFR calc Af Amer: 32 mL/min — ABNORMAL LOW (ref >60)
GFR calc non Af Amer: 27 mL/min — ABNORMAL LOW (ref >60)
GLUCOSE: 67 mg/dL (ref 65–99)
Potassium: 3.8 mmol/L (ref 3.5–5.1)
Sodium: 135 mmol/L (ref 135–145)
TOTAL PROTEIN: 4.8 g/dL — AB (ref 6.5–8.1)

## 2015-03-02 LAB — CBC WITH DIFFERENTIAL/PLATELET
Basophils Absolute: 0 10*3/uL (ref 0.0–0.1)
Basophils Relative: 0 %
EOS PCT: 2 %
Eosinophils Absolute: 0.1 10*3/uL (ref 0.0–0.7)
HEMATOCRIT: 22 % — AB (ref 39.0–52.0)
HEMOGLOBIN: 7.2 g/dL — AB (ref 13.0–17.0)
LYMPHS ABS: 0.4 10*3/uL — AB (ref 0.7–4.0)
Lymphocytes Relative: 16 %
MCH: 33.8 pg (ref 26.0–34.0)
MCHC: 32.7 g/dL (ref 30.0–36.0)
MCV: 103.3 fL — ABNORMAL HIGH (ref 78.0–100.0)
MONOS PCT: 8 %
Monocytes Absolute: 0.2 10*3/uL (ref 0.1–1.0)
NEUTROS ABS: 1.8 10*3/uL (ref 1.7–7.7)
Neutrophils Relative %: 74 %
Platelets: 43 10*3/uL — ABNORMAL LOW (ref 150–400)
RBC: 2.13 MIL/uL — AB (ref 4.22–5.81)
RDW: 23.4 % — ABNORMAL HIGH (ref 11.5–15.5)
WBC: 2.5 10*3/uL — ABNORMAL LOW (ref 4.0–10.5)

## 2015-03-02 LAB — MRSA PCR SCREENING: MRSA by PCR: NEGATIVE

## 2015-03-02 LAB — GLUCOSE, CAPILLARY
GLUCOSE-CAPILLARY: 25 mg/dL — AB (ref 65–99)
GLUCOSE-CAPILLARY: 92 mg/dL (ref 65–99)
Glucose-Capillary: 65 mg/dL (ref 65–99)
Glucose-Capillary: 48 mg/dL — ABNORMAL LOW (ref 65–99)
Glucose-Capillary: 88 mg/dL (ref 65–99)

## 2015-03-02 LAB — GRAM STAIN

## 2015-03-02 LAB — BODY FLUID CELL COUNT WITH DIFFERENTIAL
EOS FL: 1 %
LYMPHS FL: 21 %
MONOCYTE-MACROPHAGE-SEROUS FLUID: 77 % (ref 50–90)
NEUTROPHIL FLUID: 1 % (ref 0–25)
WBC FLUID: 90 uL (ref 0–1000)

## 2015-03-02 LAB — PROTEIN, BODY FLUID: Total protein, fluid: <3 g/dL

## 2015-03-02 LAB — GLUCOSE, SEROUS FLUID: Glucose, Fluid: 52 mg/dL

## 2015-03-02 LAB — ALBUMIN, FLUID (OTHER): Albumin, Fluid: <1 g/dL

## 2015-03-02 MED ORDER — CHLORHEXIDINE GLUCONATE 0.12 % MT SOLN
15.0000 mL | Freq: Two times a day (BID) | OROMUCOSAL | Status: DC
  Administered 2015-03-02 (×2): 15 mL via OROMUCOSAL
  Filled 2015-03-02: qty 15

## 2015-03-02 MED ORDER — DEXTROSE 50 % IV SOLN
INTRAVENOUS | Status: AC
  Filled 2015-03-02: qty 50

## 2015-03-02 MED ORDER — CETYLPYRIDINIUM CHLORIDE 0.05 % MT LIQD
7.0000 mL | Freq: Two times a day (BID) | OROMUCOSAL | Status: DC
  Administered 2015-03-02 – 2015-03-03 (×3): 7 mL via OROMUCOSAL

## 2015-03-02 MED ORDER — DEXTROSE 50 % IV SOLN
25.0000 mL | Freq: Once | INTRAVENOUS | Status: AC
  Administered 2015-03-02: 25 mL via INTRAVENOUS

## 2015-03-02 MED ORDER — SODIUM CHLORIDE 0.9 % IV BOLUS (SEPSIS)
1000.0000 mL | Freq: Once | INTRAVENOUS | Status: AC
  Administered 2015-03-02: 1000 mL via INTRAVENOUS

## 2015-03-02 MED ORDER — DEXTROSE-NACL 5-0.9 % IV SOLN
INTRAVENOUS | Status: DC
  Administered 2015-03-02: 16:00:00 via INTRAVENOUS

## 2015-03-02 MED ORDER — DEXTROSE 50 % IV SOLN
1.0000 | Freq: Once | INTRAVENOUS | Status: AC
  Administered 2015-03-02: 50 mL via INTRAVENOUS

## 2015-03-02 MED ORDER — MAGNESIUM SULFATE 2 GM/50ML IV SOLN
2.0000 g | Freq: Once | INTRAVENOUS | Status: AC
  Administered 2015-03-02: 2 g via INTRAVENOUS
  Filled 2015-03-02: qty 50

## 2015-03-02 NOTE — Care Management Note (Signed)
Case Management Note  Patient Details  Name: [name] MRN: [id number] Date of Birth: 19-Dec-1955  Subjective/Objective:     sepsis               Action/Plan: Date:  Oct. 10, 2016 U.R. performed for needs and level of care. Will continue to follow for Case Management needs.  Velva Harman, RN, BSN, [place]   [phone]   Expected Discharge Date:                  Expected Discharge Plan:  Home/Self Care  In-House Referral:  NA  Discharge planning Services  CM Consult  Post Acute Care Choice:  NA Choice offered to:  NA  DME Arranged:    DME Agency:     HH Arranged:    HH Agency:     Status of Service:  In process, will continue to follow  Medicare Important Message Given:    Date Medicare IM Given:    Medicare IM give by:    Date Additional Medicare IM Given:    Additional Medicare Important Message give by:     If discussed at [place] of Stay Meetings, dates discussed:    Additional Comments:  Leeroy Cha, RN 03/02/2015, 10:36 AM

## 2015-03-02 NOTE — Progress Notes (Signed)
[facility] PROGRESS NOTE  [name] MVE:[id number] DOB: 10-29-1955 DOA: 03/07/2015 PCP: Barbette Merino, MD  Assessment/Plan: 1. Sepsis- patient has hypotension, gram variable rods and blood cultures, currently on vancomycin and Zosyn. ? Source, ? SBP. Paracentesis to be performed today. We'll follow the results of ascitic fluid analysis. Lactate significantly elevated 9.24 last night. We'll recheck lactate. Will avoid fluid boluses due to the liver cirrhosis and ascites. Patient is DO NOT RESUSCITATE. Continue D5 normal saline at 50 mL per hour. 2. Altered mental status- secondary to hepatic encephalopathy and sepsis. Ammonia level is 46 3. Diabetes mellitus- continue sliding scale insulin. 4. Liver cirrhosis - patient has end-stage liver disease, lactulose, rifaximin, Lasix on hold due to sepsis.  Code  Status: DNR Family Communication: No family at bedside, unable to contact family members on phone. Disposition Plan: to be decided based on clinical improvement   Consultants:  None  Procedures:  None  Antibiotics:  Vancomycin  Zosyn  HPI/Subjective: 59 y.o. 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 Patient had initial temperature of 103.80F in the emergency department, was mildly tachypneic in the mid to high 20s and tachycardic in the 110s. Patient is unable to provide significant history, but he was admitted last month due to a similar picture.  Patient continues to be confused, hypotensive.  Blood cultures growing gram variable rods.  Objective: Filed Vitals:   03/02/15 1600  BP: 70/39  Pulse: 108  Temp: 98.7 F (37.1 C)  Resp: 29    Intake/Output Summary (Last 24  hours) at 03/02/15 1700 Last data filed at 03/02/15 1600  Gross per 24 hour  Intake 312.66 ml  Output    445 ml  Net -132.34 ml   Filed Weights   02/22/2015 1833 03/16/2015 2300  Weight: 111.131 kg (245 lb) 112.1 kg (247 lb 2.2 oz)    Exam:   General:  Confused  Cardiovascular: S1s2 RRR  Respiratory: Clear bilaterally  Abdomen: Soft, nontender, distended  Musculoskeletal: Bilateral lower extremity edema  Data Reviewed: Basic Metabolic Panel:  Recent Labs Lab 03/07/2015 1904 03/13/2015 2212 03/02/15 0410  NA 133*  --  135  K 4.1  --  3.8  CL 101  --  106  CO2 18*  --  10*  GLUCOSE 141*  --  67  BUN 52*  --  53*  CREATININE 2.08*  --  2.44*  CALCIUM 8.3*  --  7.6*  MG  --  1.6*  --   PHOS  --  3.5  --    Liver Function Tests:  Recent Labs Lab 03/12/2015 1904 03/02/15 0410  AST 101* 102*  ALT 35 32  ALKPHOS 106 64  BILITOT 3.9* 3.4*  PROT 5.8* 4.8*  ALBUMIN 2.4* 2.0*    Recent Labs Lab 03/16/2015 2154  AMMONIA 46*   CBC:  Recent Labs Lab 03/16/2015 1904 03/02/15 0410  WBC 2.0* 2.5*  NEUTROABS 1.6* 1.8  HGB 8.2* 7.2*  HCT 24.5* 22.0*  MCV 99.6 103.3*  PLT 58* 43*   Cardiac Enzymes: No results for input(s): CKTOTAL, CKMB, CKMBINDEX, TROPONINI in the last 168 hours. BNP (last 3 results)  Recent Labs  11/29/14 1145 01/15/15 0010 01/17/15 1815  BNP 29.4 46.4 75.3    ProBNP (last 3  results) No results for input(s): PROBNP in the last 8760 hours.  CBG:  Recent Labs Lab 03/02/15 1259 03/02/15 1332 03/02/15 1405 03/02/15 1552 03/02/15 1617  GLUCAP 25* 65 92 48* 88    Recent Results (from the past 240 hour(s))  Blood Culture (routine x 2)     Status: None (Preliminary result)   Collection Time: 02/28/2015  7:00 PM  Result Value Ref Range Status   Specimen Description BLOOD LEFT HAND  Final   Special Requests BOTTLES DRAWN AEROBIC AND ANAEROBIC 5CC  Final   Culture  Setup Time   Final    GRAM VARIABLE ROD IN BOTH AEROBIC AND ANAEROBIC  BOTTLES CRITICAL RESULT CALLED TO, READ BACK BY AND VERIFIED WITH: S SHAFFER 03/02/15 @ 0954 M VESTAL    Culture   Final    GRAM VARIABLE ROD Performed at [facility]    Report Status PENDING  Incomplete  Blood Culture (routine x 2)     Status: None (Preliminary result)   Collection Time: 02/27/2015  7:20 PM  Result Value Ref Range Status   Specimen Description BLOOD LEFT WRIST  Final   Special Requests BOTTLES DRAWN AEROBIC AND ANAEROBIC 5CC  Final   Culture  Setup Time   Final    GRAM VARIABLE ROD IN BOTH AEROBIC AND ANAEROBIC BOTTLES CRITICAL RESULT CALLED TO, READ BACK BY AND VERIFIED WITH: S [place] 03/02/15 @ 89 M VESTAL    Culture   Final    GRAM VARIABLE ROD Performed at [facility]    Report Status PENDING  Incomplete  MRSA PCR Screening     Status: None   Collection Time: 02/22/2015 10:57 PM  Result Value Ref Range Status   MRSA by PCR NEGATIVE NEGATIVE Final    Comment:        The GeneXpert MRSA Assay (FDA approved for NASAL specimens only), is one component of a comprehensive MRSA colonization surveillance program. It is not intended to diagnose MRSA infection nor to guide or monitor treatment for MRSA infections.      Studies: US Paracentesis  03/02/2015   INDICATION: Ascites, request for diagnostic paracentesis only secondary to hypotension and concern for peritonitis.  EXAM: ULTRASOUND-GUIDED PARACENTESIS  COMPARISON:  Paracentesis 02/19/2015.  MEDICATIONS: None.  COMPLICATIONS: None immediate  TECHNIQUE: Informed written consent was obtained from the patient after a discussion of the risks, benefits and alternatives to treatment. A timeout was performed prior to the initiation of the procedure.  Initial ultrasound scanning demonstrates a large amount of ascites within the right lower abdominal quadrant. The right lower abdomen was prepped and draped in the usual sterile fashion. 1% lidocaine was used for local anesthesia.  Under direct  ultrasound guidance, a 19 gauge, 10-cm, Yueh catheter was introduced. An ultrasound image was saved for documentation purposed. The paracentesis was performed. The catheter was removed and a dressing was applied. The patient tolerated the procedure well without immediate post procedural complication.  FINDINGS: A total of approximately 200 ml of serous fluid was removed. Samples were sent to the laboratory as requested by the clinical team.  IMPRESSION: Successful ultrasound-guided paracentesis yielding 200 ml of peritoneal fluid.  Read By:  Tsosie Billing PA-C   Electronically Signed   By: Lucrezia Europe M.D.   On: 03/02/2015 13:55   Dg Chest Port 1 View  03/02/2015   CLINICAL DATA:  Shortness of breath  EXAM: PORTABLE CHEST 1 VIEW  COMPARISON:  03/02/2015  FINDINGS: There are low lung volumes. Diffuse  interstitial prominence throughout the lungs with peribronchial thickening. Heart is normal size. No effusions. No acute bony abnormality.  IMPRESSION: Low lung volumes. Diffuse interstitial prominence and peribronchial thickening. Findings could reflect bronchitis or edema. Recommend clinical correlation.   Electronically Signed   By: Rolm Baptise M.D.   On: 03/02/2015 12:00   Dg Chest Port 1 View  03/22/2015   CLINICAL DATA:  Clinical suspicion of sepsis.  EXAM: PORTABLE CHEST 1 VIEW  COMPARISON:  01/25/2015  FINDINGS: Cardiomediastinal silhouette is prominent, and may be exaggerated by portable technique. Mediastinal contours appear intact.  There is no evidence of focal airspace consolidation, pleural effusion or pneumothorax. Lung volumes are low.  Osseous structures are without acute abnormality. Soft tissues are grossly normal.  IMPRESSION: Low lung volumes.  Mild enlargement of the cardiac silhouette.   Electronically Signed   By: Fidela Salisbury M.D.   On: 03/18/2015 18:51    Scheduled Meds: . antiseptic oral rinse  7 mL Mouth Rinse q12n4p  . chlorhexidine  15 mL Mouth Rinse BID  . ipratropium   0.5 mg Nebulization Q6H  . levalbuterol  1.25 mg Nebulization Q6H  . piperacillin-tazobactam (ZOSYN)  IV  3.375 g Intravenous Q8H  . sodium chloride  3 mL Intravenous Q12H  . vancomycin  1,250 mg Intravenous Q24H   Continuous Infusions: . dextrose 5 % and 0.9% NaCl 50 mL/hr at 03/02/15 1608    Principal Problem:   Sepsis ([place]) Active Problems:   HTN (hypertension)   Diabetes mellitus ([place])   Bipolar affect, depressed ([place])   Pancytopenia ([place])   Altered mental status   Anasarca   ALC (alcoholic liver cirrhosis) ([place])    Time spent: 25 min    [facility]  [facility] Pager [phone]*. If 7PM-7AM, please contact night-coverage at [email or web address], password [email or web address] 03/02/2015, 5:00 PM  LOS: 1 day

## 2015-03-02 NOTE — Progress Notes (Signed)
Hypoglycemic Event  CBG: 25  Treatment: D50 50 mL  Symptoms: Pale, Sweaty, Shaky and Nervous/irritable  Follow-up CBG: Time:13:35 CBG Result:65  Possible Reasons for Event: Inadequate meal intake  Comments/MD notified:Notified Dr. Lovey Newcomer, Rozanna Box

## 2015-03-02 NOTE — Progress Notes (Signed)
Decreased per sats.

## 2015-03-02 NOTE — Progress Notes (Signed)
Hypoglycemic Event  CBG: 48  Treatment: D50 IV 50 mL  Symptoms: Pale, Shaky and Nervous/irritable  Follow-up CBG: Time:88 CBG Result:16:15  Possible Reasons for Event: Inadequate meal intake  Comments/MD notified: Dr. Darrick Meigs notified - D5NS ordered    Adele Dan

## 2015-03-02 NOTE — Procedures (Signed)
Successful US guided paracentesis from RLQ.  Yielded 2108ml of clear serous fluid.  No immediate complications.  Pt tolerated well.   Specimen was sent for labs.  Tsosie Billing D PA-C 03/02/2015 1:52 PM

## 2015-03-02 NOTE — Progress Notes (Signed)
Hypoglycemic Event  CBG: 65  Treatment: D50 IV 25 mL  Symptoms: Pale, Sweaty, Shaky and Nervous/irritable  Follow-up CBG: Time:14:05 CBG Result:92  Possible Reasons for Event: Inadequate meal intake  Comments/MD notified:Dr. Darrick Meigs notified    Adele Dan

## 2015-03-02 NOTE — Progress Notes (Signed)
CRITICAL VALUE ALERT  Critical value received: blood cultures - gram variable rods in anaerobic bottle  Date of notification:  03/02/15  Time of notification:  10:06  Critical value read back:Yes.    Nurse who received alert:  Juel Burrow, RN  MD notified (1st page):  Dr. Darrick Meigs  Time of first page:  10:09  MD notified (2nd page):  Time of second page:  Responding MD:  Dr. Darrick Meigs  Time MD responded:  No response

## 2015-03-03 DIAGNOSIS — D61818 Other pancytopenia: Secondary | ICD-10-CM

## 2015-03-03 DIAGNOSIS — K7031 Alcoholic cirrhosis of liver with ascites: Secondary | ICD-10-CM

## 2015-03-03 DIAGNOSIS — A419 Sepsis, unspecified organism: Principal | ICD-10-CM

## 2015-03-03 DIAGNOSIS — R601 Generalized edema: Secondary | ICD-10-CM

## 2015-03-03 DIAGNOSIS — Z515 Encounter for palliative care: Secondary | ICD-10-CM

## 2015-03-03 LAB — GLUCOSE, CAPILLARY
GLUCOSE-CAPILLARY: 101 mg/dL — AB (ref 65–99)
GLUCOSE-CAPILLARY: 30 mg/dL — AB (ref 65–99)
GLUCOSE-CAPILLARY: 32 mg/dL — AB (ref 65–99)
GLUCOSE-CAPILLARY: 68 mg/dL (ref 65–99)
Glucose-Capillary: 84 mg/dL (ref 65–99)
Glucose-Capillary: 41 mg/dL — CL (ref 65–99)
Glucose-Capillary: 86 mg/dL (ref 65–99)
Glucose-Capillary: 72 mg/dL (ref 65–99)

## 2015-03-03 LAB — URINE CULTURE: Culture: 5000

## 2015-03-03 LAB — COMPREHENSIVE METABOLIC PANEL
ALBUMIN: 1.9 g/dL — AB (ref 3.5–5.0)
ALT: 119 U/L — ABNORMAL HIGH (ref 17–63)
ANION GAP: 17 — AB (ref 5–15)
AST: 372 U/L — AB (ref 15–41)
Alkaline Phosphatase: 38 U/L (ref 38–126)
BUN: 58 mg/dL — ABNORMAL HIGH (ref 6–20)
CO2: 12 mmol/L — AB (ref 22–32)
Calcium: 7.9 mg/dL — ABNORMAL LOW (ref 8.9–10.3)
Chloride: 107 mmol/L (ref 101–111)
Creatinine, Ser: 2.78 mg/dL — ABNORMAL HIGH (ref 0.61–1.24)
GFR calc Af Amer: 27 mL/min — ABNORMAL LOW (ref >60)
GFR calc non Af Amer: 23 mL/min — ABNORMAL LOW (ref >60)
GLUCOSE: 52 mg/dL — AB (ref 65–99)
POTASSIUM: 4.2 mmol/L (ref 3.5–5.1)
SODIUM: 136 mmol/L (ref 135–145)
Total Bilirubin: 4.1 mg/dL — ABNORMAL HIGH (ref 0.3–1.2)
Total Protein: 4.7 g/dL — ABNORMAL LOW (ref 6.5–8.1)

## 2015-03-03 LAB — CBC WITH DIFFERENTIAL/PLATELET
BASOS PCT: 0 %
Basophils Absolute: 0 10*3/uL (ref 0.0–0.1)
EOS ABS: 0.2 10*3/uL (ref 0.0–0.7)
Eosinophils Relative: 3 %
HEMATOCRIT: 22.4 % — AB (ref 39.0–52.0)
HEMOGLOBIN: 7.3 g/dL — AB (ref 13.0–17.0)
LYMPHS PCT: 7 %
Lymphs Abs: 0.4 10*3/uL — ABNORMAL LOW (ref 0.7–4.0)
MCH: 33.8 pg (ref 26.0–34.0)
MCHC: 32.6 g/dL (ref 30.0–36.0)
MCV: 103.7 fL — AB (ref 78.0–100.0)
MONOS PCT: 4 %
Monocytes Absolute: 0.2 10*3/uL (ref 0.1–1.0)
NEUTROS ABS: 4.9 10*3/uL (ref 1.7–7.7)
NEUTROS PCT: 86 %
PLATELETS: 26 10*3/uL — AB (ref 150–400)
RBC: 2.16 MIL/uL — ABNORMAL LOW (ref 4.22–5.81)
RDW: 23.6 % — ABNORMAL HIGH (ref 11.5–15.5)
WBC: 5.7 10*3/uL (ref 4.0–10.5)

## 2015-03-03 LAB — PATHOLOGIST SMEAR REVIEW

## 2015-03-03 LAB — PH, BODY FLUID: pH, Body Fluid: 7.7

## 2015-03-03 LAB — AMYLASE, PERITONEAL FLUID: Amylase, peritoneal fluid: 2 U/L

## 2015-03-03 MED ORDER — DEXTROSE 5 % IV SOLN: INTRAVENOUS | Status: DC

## 2015-03-03 MED ORDER — DEXTROSE 50 % IV SOLN
INTRAVENOUS | Status: AC
  Administered 2015-03-03: 50 mL
  Filled 2015-03-03: qty 50

## 2015-03-03 MED ORDER — DEXTROSE 5 % IV BOLUS
250.0000 mL | Freq: Once | INTRAVENOUS | Status: AC
  Administered 2015-03-03: 250 mL via INTRAVENOUS

## 2015-03-03 MED ORDER — LORAZEPAM 2 MG/ML IJ SOLN
1.0000 mg | INTRAMUSCULAR | Status: DC | PRN
  Administered 2015-03-03: 1 mg via INTRAVENOUS
  Filled 2015-03-03: qty 1

## 2015-03-03 MED ORDER — SODIUM CHLORIDE 0.9 % IV SOLN
0.2500 mg/h | INTRAVENOUS | Status: DC
  Administered 2015-03-03: 0.25 mg/h via INTRAVENOUS
  Filled 2015-03-03: qty 2.5

## 2015-03-03 MED ORDER — HYDROMORPHONE HCL 1 MG/ML IJ SOLN
0.5000 mg | INTRAMUSCULAR | Status: DC | PRN
  Administered 2015-03-03 (×2): 0.5 mg via INTRAVENOUS
  Filled 2015-03-03 (×2): qty 1

## 2015-03-03 MED ORDER — HYDROMORPHONE BOLUS VIA INFUSION
0.5000 mg | INTRAVENOUS | Status: DC | PRN
  Filled 2015-03-03: qty 1

## 2015-03-03 MED ORDER — GLYCOPYRROLATE 0.2 MG/ML IJ SOLN
0.2000 mg | INTRAMUSCULAR | Status: DC | PRN
  Filled 2015-03-03: qty 1

## 2015-03-04 LAB — CULTURE, BLOOD (ROUTINE X 2)

## 2015-03-07 LAB — CULTURE, BODY FLUID-BOTTLE: CULTURE: NO GROWTH

## 2015-03-07 LAB — ANAEROBIC CULTURE: GRAM STAIN: NONE SEEN

## 2015-03-07 LAB — CULTURE, BODY FLUID W GRAM STAIN -BOTTLE

## 2015-03-24 NOTE — Progress Notes (Signed)
[facility] PROGRESS NOTE  [name] QVZ:[id number] DOB: Aug 14, 1955 DOA: 03/20/2015 PCP: Barbette Merino, MD  Assessment/Plan: 1. Sepsis- patient has hypotension, gram variable rods and blood cultures, currently on vancomycin and Zosyn. ? Source, ? SBP. Paracentesis was performed yesterday, yielded 200 mL of coronary fluid. Total cell count 90, LDH 34, cultures pending.[name] follow the results of ascitic fluid analysis. Lactate significantly elevated 9.24 last night. We'll recheck lactate. Will avoid fluid boluses due to the liver cirrhosis and ascites. Patient is DO NOT RESUSCITATE. Continue D5 normal saline at 50 mL per hour. 2. Acute respiratory failure- secondary to pulmonary edema, on Bipap. Cannot give lasix due to hypotension. 3. Altered mental status- secondary to hepatic encephalopathy and sepsis. Ammonia level is 46 4. Diabetes mellitus- continue sliding scale insulin. 5. Liver cirrhosis - patient has end-stage liver disease, lactulose, rifaximin, Lasix on hold due to sepsis.  Code  Status: DNR Family Communication: Called patient's brother [name], and conformed DO NOT RESUSCITATE status. Also discussed patient's declining clinical status, patient's brother agrees to discuss with palliative care for goals of care discussion. We'll get better care consult. Disposition Plan: to be decided based on clinical improvement   Consultants:  None  Procedures:  None  Antibiotics:  Vancomycin  Zosyn  HPI/Subjective: 59 y.o. 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 Patient had initial temperature of 103.11F in the emergency department, was mildly tachypneic in the mid to high  20s and tachycardic in the 110s. Patient is unable to provide significant history, but he was admitted last month due to a similar picture.  Patient continues to be confused, hypotensive.  Blood cultures growing gram variable rods. Currently requiring Bipap.  Objective: Filed Vitals:   March 18, 2015 1000  BP: 80/30  Pulse: 114  Temp:   Resp: 33    Intake/Output Summary (Last 24 hours) at 03-18-2015 1116 Last data filed at 18-Mar-2015 1000  Gross per 24 hour  Intake 1507.33 ml  Output    240 ml  Net 1267.33 ml   Filed Weights   03/10/2015 1833 02/23/2015 2300  Weight: 111.131 kg (245 lb) 112.1 kg (247 lb 2.2 oz)    Exam:   General:  Confused, on Bipap  Cardiovascular: S1s2 RRR  Respiratory: Decreased breath sounds bilaterally  Abdomen: Soft, nontender, distended  Musculoskeletal: Bilateral lower extremity edema  Data Reviewed: Basic Metabolic Panel:  Recent Labs Lab 02/28/2015 1904 02/23/2015 2212 03/02/15 0410 03/18/2015 0400  NA 133*  --  135 136  K 4.1  --  3.8 4.2  CL 101  --  106 107  CO2 18*  --  10* 12*  GLUCOSE 141*  --  67 52*  BUN 52*  --  53* 58*  CREATININE 2.08*  --  2.44* 2.78*  CALCIUM 8.3*  --  7.6* 7.9*  MG  --  1.6*  --   --   PHOS  --  3.5  --   --    Liver Function Tests:  Recent Labs Lab 02/24/2015 1904 03/02/15 0410 2015-03-18 0400  AST 101* 102* 372*  ALT 35 32 119*  ALKPHOS 106 64 38  BILITOT 3.9* 3.4* 4.1*  PROT 5.8* 4.8* 4.7*  ALBUMIN 2.4* 2.0* 1.9*    Recent Labs Lab 03/15/2015  2154  AMMONIA 46*   CBC:  Recent Labs Lab 03/12/2015 1904 03/02/15 0410 03-23-2015 0400  WBC 2.0* 2.5* 5.7  NEUTROABS 1.6* 1.8 4.9  HGB 8.2* 7.2* 7.3*  HCT 24.5* 22.0* 22.4*  MCV 99.6 103.3* 103.7*  PLT 58* 43* 26*   Cardiac Enzymes: No results for input(s): CKTOTAL, CKMB, CKMBINDEX, TROPONINI in the last 168 hours. BNP (last 3 results)  Recent Labs  11/29/14 1145 01/15/15 0010 01/17/15 1815  BNP 29.4 46.4 75.3    ProBNP (last 3 results) No  results for input(s): PROBNP in the last 8760 hours.  CBG:  Recent Labs Lab 03/23/15 0023 03-23-2015 0107 23-Mar-2015 0425 2015-03-23 0508 03/23/2015 0759  GLUCAP 32* 84 41* 86 72    Recent Results (from the past 240 hour(s))  Blood Culture (routine x 2)     Status: None (Preliminary result)   Collection Time: 03/22/2015  7:00 PM  Result Value Ref Range Status   Specimen Description BLOOD LEFT HAND  Final   Special Requests BOTTLES DRAWN AEROBIC AND ANAEROBIC 5CC  Final   Culture  Setup Time   Final    GRAM VARIABLE ROD IN BOTH AEROBIC AND ANAEROBIC BOTTLES CRITICAL RESULT CALLED TO, READ BACK BY AND VERIFIED WITH: S SHAFFER 03/02/15 @ 0954 M VESTAL    Culture   Final    GRAM NEGATIVE RODS Performed at [facility]    Report Status PENDING  Incomplete  Blood Culture (routine x 2)     Status: None (Preliminary result)   Collection Time: 03/17/2015  7:20 PM  Result Value Ref Range Status   Specimen Description BLOOD LEFT WRIST  Final   Special Requests BOTTLES DRAWN AEROBIC AND ANAEROBIC 5CC  Final   Culture  Setup Time   Final    GRAM VARIABLE ROD IN BOTH AEROBIC AND ANAEROBIC BOTTLES CRITICAL RESULT CALLED TO, READ BACK BY AND VERIFIED WITH: S SHAFFER 03/02/15 @ 37 M VESTAL    Culture   Final    GRAM NEGATIVE RODS Performed at [facility]    Report Status PENDING  Incomplete  MRSA PCR Screening     Status: None   Collection Time: 03/22/2015 10:57 PM  Result Value Ref Range Status   MRSA by PCR NEGATIVE NEGATIVE Final    Comment:        The GeneXpert MRSA Assay (FDA approved for NASAL specimens only), is one component of a comprehensive MRSA colonization surveillance program. It is not intended to diagnose MRSA infection nor to guide or monitor treatment for MRSA infections.   Urine culture     Status: None   Collection Time: 03/02/15  3:30 AM  Result Value Ref Range Status   Specimen Description URINE, CATHETERIZED  Final   Special Requests NONE   Final   Culture   Final    5,000 COLONIES/mL INSIGNIFICANT GROWTH Performed at [facility]    Report Status 03-23-2015 FINAL  Final  Anaerobic culture     Status: None (Preliminary result)   Collection Time: 03/02/15  2:03 PM  Result Value Ref Range Status   Specimen Description PERITONEAL FLUID  Final   Special Requests NONE  Final   Gram Stain   Final    NO WBC SEEN NO SQUAMOUS EPITHELIAL CELLS SEEN NO ORGANISMS SEEN Performed at [facility]    Culture PENDING  Incomplete   Report Status PENDING  Incomplete  Gram stain     Status: None   Collection Time: 03/02/15  2:03 PM  Result Value Ref Range Status   Specimen Description FLUID PERITONEAL  Final   Special Requests NONE  Final   Gram Stain   Final    MODERATE WBC PRESENT, PREDOMINANTLY MONONUCLEAR NO ORGANISMS SEEN Performed at [facility]    Report Status 03/02/2015 FINAL  Final     Studies: US Paracentesis  03/02/2015   INDICATION: Ascites, request for diagnostic paracentesis only secondary to hypotension and concern for peritonitis.  EXAM: ULTRASOUND-GUIDED PARACENTESIS  COMPARISON:  Paracentesis 02/19/2015.  MEDICATIONS: None.  COMPLICATIONS: None immediate  TECHNIQUE: Informed written consent was obtained from the patient after a discussion of the risks, benefits and alternatives to treatment. A timeout was performed prior to the initiation of the procedure.  Initial ultrasound scanning demonstrates a large amount of ascites within the right lower abdominal quadrant. The right lower abdomen was prepped and draped in the usual sterile fashion. 1% lidocaine was used for local anesthesia.  Under direct ultrasound guidance, a 19 gauge, 10-cm, Yueh catheter was introduced. An ultrasound image was saved for documentation purposed. The paracentesis was performed. The catheter was removed and a dressing was applied. The patient tolerated the procedure well without immediate post procedural complication.   FINDINGS: A total of approximately 200 ml of serous fluid was removed. Samples were sent to the laboratory as requested by the clinical team.  IMPRESSION: Successful ultrasound-guided paracentesis yielding 200 ml of peritoneal fluid.  Read By:  Tsosie Billing PA-C   Electronically Signed   By: Lucrezia Europe M.D.   On: 03/02/2015 13:55   Dg Chest Port 1 View  03/02/2015   CLINICAL DATA:  Shortness of breath  EXAM: PORTABLE CHEST 1 VIEW  COMPARISON:  03/10/2015  FINDINGS: There are low lung volumes. Diffuse interstitial prominence throughout the lungs with peribronchial thickening. Heart is normal size. No effusions. No acute bony abnormality.  IMPRESSION: Low lung volumes. Diffuse interstitial prominence and peribronchial thickening. Findings could reflect bronchitis or edema. Recommend clinical correlation.   Electronically Signed   By: Rolm Baptise M.D.   On: 03/02/2015 12:00   Dg Chest Port 1 View  02/27/2015   CLINICAL DATA:  Clinical suspicion of sepsis.  EXAM: PORTABLE CHEST 1 VIEW  COMPARISON:  01/25/2015  FINDINGS: Cardiomediastinal silhouette is prominent, and may be exaggerated by portable technique. Mediastinal contours appear intact.  There is no evidence of focal airspace consolidation, pleural effusion or pneumothorax. Lung volumes are low.  Osseous structures are without acute abnormality. Soft tissues are grossly normal.  IMPRESSION: Low lung volumes.  Mild enlargement of the cardiac silhouette.   Electronically Signed   By: Fidela Salisbury M.D.   On: 03/20/2015 18:51    Scheduled Meds: . antiseptic oral rinse  7 mL Mouth Rinse q12n4p  . chlorhexidine  15 mL Mouth Rinse BID  . ipratropium  0.5 mg Nebulization Q6H  . levalbuterol  1.25 mg Nebulization Q6H  . piperacillin-tazobactam (ZOSYN)  IV  3.375 g Intravenous Q8H  . sodium chloride  3 mL Intravenous Q12H  . vancomycin  1,250 mg Intravenous Q24H   Continuous Infusions: . dextrose 50 mL/hr at 27-Mar-2015 0510    Principal  Problem:   Sepsis ([place]) Active Problems:   HTN (hypertension)   Diabetes mellitus ([employer])   Bipolar affect, depressed ([place])   Pancytopenia ([place])   Altered mental status   Anasarca   ALC (alcoholic liver cirrhosis) ([place])    Time spent: 25 min    [facility]  Pager [phone]*. If 7PM-7AM, please contact night-coverage at [email or web address], password [facility] 05-Mar-2015, 11:16 AM  LOS: 2 days

## 2015-03-24 NOTE — Progress Notes (Signed)
Patient placed on bleeding precautions due to platelet counts of 26. Darrell Parody, RN

## 2015-03-24 NOTE — Progress Notes (Signed)
Hypoglycemic Event  CBG: 30  Treatment: D50 IV 50 mL  Symptoms: None  Follow-up CBG: Time:0107 CBG Result:84  Possible Reasons for Event: Unknown  Comments/MD notified:MD aware    Shimika Ames, Coralee Pesa

## 2015-03-24 NOTE — Progress Notes (Signed)
Hypoglycemic Event  CBG: 41   Treatment: D50 IV 50 mL  Symptoms: Pale and Nervous/irritable  Follow-up CBG: Time:0507 CBG Result:86  Possible Reasons for Event: Unknown  Comments/MD notified:K. Kirby notified-Fluids changed to D5 at 60mL/h with 28ml bolus    Darleny Sem, Coralee Pesa

## 2015-03-24 NOTE — Progress Notes (Signed)
   22-Mar-2015 1200  Clinical Encounter Type  Visited With Family  Visit Type Initial;Spiritual support;Critical Care  Referral From Nurse  Consult/Referral To Chaplain  Spiritual Encounters  Spiritual Needs Prayer;Emotional;Other (Comment) (PastoralConversation)  Stress Factors  Patient Stress Factors None identified  Family Stress Factors Health changes   Chaplain was contacted by Verdis Frederickson from [employer] about a request for [employer] priest. She only knew the patient's first name, [name]. I informed her that I could not give out information without confirmation from next of kin, which she understood.  I received permission from the patient's girlfriend, one of his POAs, to have the priest come and give Last Rites. [place] will come and visit the patient this afternoon.  The Chaplain will follow up with the patient and his family at a later time.

## 2015-03-24 NOTE — Progress Notes (Signed)
CRITICAL VALUE ALERT  Critical value received:  Platelets 26  Date of notification:  03/20/2015  Time of notification:  0528  Critical value read back:Yes.    Nurse who received alert:  Jacolyn Reedy, RN  MD notified (1st page):  Tylene Fantasia  Time of first page:  0530  MD notified (2nd page):  Time of second page:  Responding MD:  Tylene Fantasia   Time MD responded: [phone]

## 2015-03-24 NOTE — Progress Notes (Signed)
IV Dilaudid: Wasted 39mls; witnessed by Prince Rome, RN

## 2015-03-24 NOTE — Progress Notes (Signed)
Patient expired at [employer], 2 RNs verified: Wandra Scot, RN and Prince Rome, RN

## 2015-03-24 NOTE — Progress Notes (Signed)
Denise RN informed me that the plan of care was comfort only and to wait 10 minutes after the bolus and drip of dilaudid to d/c the Bipap. Patient was pronounced at 17:21

## 2015-03-24 NOTE — Progress Notes (Signed)
Initial Nutrition Assessment  DOCUMENTATION CODES:   Non-severe (moderate) malnutrition in context of chronic illness  INTERVENTION:   Diet advancement per MD If GOC permit, recommend 30 ml Prostat BID and Glucerna shakes BID. RD to continue to monitor for plan  NUTRITION DIAGNOSIS:   Increased nutrient needs related to chronic illness, other (see comment) (liver cirrhosis) as evidenced by estimated needs.  GOAL:   Patient will meet greater than or equal to 90% of their needs  MONITOR:   Diet advancement, Labs, Weight trends, Skin, I & O's, Other (Comment) (GOC)  REASON FOR ASSESSMENT:   Low Braden    ASSESSMENT:   59 y.o. 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   Pt in room on Bi-Pap with no family present. Pt unable to provide history or answer questions. Pt currently NPO. [place] discussion with family planned, pt was made DNR today.  Weight continues to be difficult to assess due to fluctuations d/t ascites. Noted overall wt gain trend over the past 6 months.   Nutrition-Focused physical exam completed. Findings are moderate fat depletion, severe muscle depletion, and severe edema.   Labs reviewed: CBGs: 41-86 Elevated BUN & Creatinine  Diet Order:  Diet NPO time specified  Skin:  Reviewed, no issues  Last BM:  10/10  Height:   Ht Readings from Last 1 Encounters:  02/22/2015 5\' 11"  (1.803 m)    Weight:   Wt Readings from Last 1 Encounters:  03/20/2015 247 lb 2.2 oz (112.1 kg)    Ideal Body Weight:  78.2 kg  BMI:  Body mass index is 34.48 kg/(m^2).  Estimated Nutritional Needs:   Kcal:  2400-2600  Protein:  120-130g  Fluid:  1.5-1.8L/day  EDUCATION  NEEDS:   No education needs identified at this time  Clayton Bibles, MS, RD, LDN Pager: [phone] After Hours Pager: [phone]

## 2015-03-24 NOTE — Consult Note (Signed)
Consultation Note Date: 03/26/2015   Patient Name: [name]  DOB: 02/29/1956  MRN: [id number]  Age / Sex: 59 y.o., male   PCP: Darrell Reach, MD Referring Physician: Oswald Hillock, MD  Reason for Consultation: [place] Assessment and Plan Summary of Established Goals of Care and Medical Treatment Preferences    Palliative Care Discussion Held Today:   I spoke today with [name]'s brother and sister-in-law, [name] and [name]. They are confirmed as HCPOA as they faxed over documents. I explained that [name] is not really responding past moaning and not opening his eyes, he is hypotensive with little to no urine output. They are both in agreement that since he is at EOL they want him comfortable and do not want any prolonging measures at this time. They say that they have been to visit recently where they discussed his wishes and even have arrangement made for funeral complete. They say that he has suffered enough and he would not want this.   I discussed a plan for medication and d/c BiPAP and focus on keeping him comfortable. They are very concerned about him receiving prn dosage of medication and "do not want to wait for him to be miserable and moaning before he gets medicine." We discussed initiating of very low dose hydromorphone infusion prior to taking him off BiPAP as I am also concerned about respiratory distress. Explained prognosis is extremely poor and could be just hours.   I attempted to call his friend, [name], after discussing with [name] and [name] but could not Baker her. [name] does not wish to wait and wants to begin transition to comfort now so I will respect these wishes and will await call back from [facility] to help support her as well. Emotional support provided.    Contacts/Participants in Discussion: Primary Decision Maker: Brother [name]  Goals of Care/Code Status/Advance Care Planning:   Code Status: DNR  Comfort care.    Symptom  Management:   Pain/Dyspnea: Hydromorphone at 0.25 mg/hr with 0.5 mg bolus every 15 min prn.   Anxiety: Lorazepam 1 mg every 4 hours prn.   Psycho-social/Spiritual:   Support System: His friend is his main support that is physically here in town.   Desire for further Chaplaincy support: no - priest has already come to anoint him.   Prognosis: Likely hours but may be days.   Discharge Planning:  Anticipate hospital death.        Chief Complaint/HPI: 59 yo male with PMH type 2 DM, bipolar, HTN, COPD, h/o cocaine and alcohol abuse, end stage liver disease, COPD, stroke. His has been declining with progressive liver disease and admitted with change in mental status and fever.   Primary Diagnoses  Present on Admission:  . Sepsis ([place]) . HTN (hypertension) . Bipolar affect, depressed ([place]) . Pancytopenia ([place]) . ALC (alcoholic liver cirrhosis) ([place]) . Anasarca . Altered mental status  Palliative Review of Systems:   Unable to assess - minimally responsive.    I have reviewed the medical record, interviewed the patient and family, and examined the patient. The following aspects are pertinent.  Past Medical History  Diagnosis Date  . Neuropathy ([place])   . Diabetes mellitus   . Bipolar affect, depressed ([place])   . Hypertension   . Arthritis   . Stroke [facility])     Mini stroke about 74yrs ago  . Cirrhosis ([place])   . Alcohol abuse   . Chronic pain   .  Cocaine abuse   . Muscle spasm     both legs  . Encephalopathy, hepatic ([place])   . Detached retina   . COPD (chronic obstructive pulmonary disease) ([facility])     emphysema  . Bronchitis   . Barrett's esophagus   . GERD (gastroesophageal reflux disease)     has ulcer  . Anemia    Social History   Social History  . Marital Status: Divorced    Spouse Name: N/A  . Number of Children: N/A  . Years of Education: N/A   Social History Main Topics  . Smoking status: Current Every Day Smoker -- 1.00 packs/day for 30 years     Types: Cigarettes  . Smokeless tobacco: Never Used  . Alcohol Use: 0.0 oz/week     Comment: 12 pk beer daily  06/2013 - no alcohol since 11/2012  . Drug Use: Yes    Special: Cocaine     Comment: 1 gram weekly  06/2013 - has not had any for a year  . Sexual Activity: Yes    Birth Control/ Protection: None   Other Topics Concern  . None   Social History Narrative   Family History  Problem Relation Age of Onset  . Hypotension Mother    Scheduled Meds: . antiseptic oral rinse  7 mL Mouth Rinse q12n4p  . chlorhexidine  15 mL Mouth Rinse BID  . ipratropium  0.5 mg Nebulization Q6H  . levalbuterol  1.25 mg Nebulization Q6H  . piperacillin-tazobactam (ZOSYN)  IV  3.375 g Intravenous Q8H  . sodium chloride  3 mL Intravenous Q12H  . vancomycin  1,250 mg Intravenous Q24H   Continuous Infusions: . dextrose 50 mL/hr at 03-10-15 0510   PRN Meds:.sodium chloride, HYDROmorphone (DILAUDID) injection, LORazepam, ondansetron (ZOFRAN) IV, sodium chloride Medications Prior to Admission:  Prior to Admission medications   Medication Sig Start Date End Date Taking? Authorizing Provider  Amino Acids-Protein Hydrolys (FEEDING SUPPLEMENT, PRO-STAT SUGAR FREE 64,) LIQD Take 30 mLs by mouth 2 (two) times daily. 01/23/15  Yes Robbie Lis, MD  amitriptyline (ELAVIL) 10 MG tablet Take 1 tablet (10 mg total) by mouth at bedtime. 02/01/15  Yes Janece Canterbury, MD  Cholecalciferol (VITAMIN D PO) Take 5,000 Units by mouth daily.   Yes Historical Provider, MD  clonazePAM (KLONOPIN) 0.25 MG disintegrating tablet Take 0.25 mg by mouth 2 (two) times daily as needed (anxiety).   Yes Historical Provider, MD  diclofenac sodium (VOLTAREN) 1 % GEL Apply 4 g topically daily.    Yes Historical Provider, MD  ferrous sulfate 325 (65 FE) MG tablet Take 1 tablet (325 mg total) by mouth 2 (two) times daily with a meal. 02/01/15  Yes Janece Canterbury, MD  folic acid (FOLVITE) 1 MG tablet Take 1 tablet (1 mg total) by mouth daily.  10/05/14  Yes Nita Sells, MD  furosemide (LASIX) 80 MG tablet Take 1 tablet (80 mg total) by mouth 2 (two) times daily. 02/01/15  Yes Janece Canterbury, MD  hydrocortisone cream 1 % Apply 1 application topically 2 (two) times daily as needed for itching.   Yes Historical Provider, MD  hydrOXYzine (ATARAX/VISTARIL) 50 MG tablet Take 1 tablet (50 mg total) by mouth at bedtime. 02/01/15  Yes Janece Canterbury, MD  lactulose (CHRONULAC) 10 GM/15ML solution Take 45 mLs (30 g total) by mouth 2 (two) times daily. 02/01/15  Yes Janece Canterbury, MD  magnesium oxide (MAG-OX) 400 (241.3 MG) MG tablet Take 1 tablet (400 mg total) by mouth 2 (  two) times daily. 09/03/14  Yes Reyne Dumas, MD  midodrine (PROAMATINE) 10 MG tablet Take 1 tablet (10 mg total) by mouth 3 (three) times daily with meals. 02/01/15  Yes Janece Canterbury, MD  Multiple Vitamin (MULTIVITAMIN WITH MINERALS) TABS tablet Take 1 tablet by mouth daily. 07/31/14  Yes Geradine Girt, DO  multivitamin-iron-minerals-folic acid (THERAPEUTIC-M) TABS tablet Take 1 tablet by mouth daily.   Yes Historical Provider, MD  ondansetron (ZOFRAN-ODT) 4 MG disintegrating tablet Take 4 mg by mouth every 8 (eight) hours as needed for nausea or vomiting.   Yes Historical Provider, MD  oxyCODONE (OXY IR/ROXICODONE) 5 MG immediate release tablet Take 1 tablet (5 mg total) by mouth every 6 (six) hours as needed for moderate pain or severe pain. 02/01/15  Yes Janece Canterbury, MD  pantoprazole (PROTONIX) 40 MG tablet Take 1 tablet (40 mg total) by mouth daily. 01/23/15  Yes Robbie Lis, MD  rifaximin (XIFAXAN) 550 MG TABS tablet Take 1 tablet (550 mg total) by mouth 2 (two) times daily. 01/23/15  Yes Robbie Lis, MD  thiamine (VITAMIN B-1) 100 MG tablet Take 100 mg by mouth daily.   Yes Historical Provider, MD  venlafaxine (EFFEXOR) 25 MG tablet Take 25 mg by mouth 2 (two) times daily.   Yes Historical Provider, MD  collagenase (SANTYL) ointment Apply topically daily. Patient  not taking: Reported on 03/20/2015 01/23/15   Robbie Lis, MD  doxycycline (VIBRA-TABS) 100 MG tablet Take 1 tablet (100 mg total) by mouth 2 (two) times daily. Patient not taking: Reported on 03/02/2015 02/01/15   Janece Canterbury, MD  feeding supplement (BOOST / RESOURCE BREEZE) LIQD Take 1 Container by mouth 3 (three) times daily between meals. Patient not taking: Reported on 03/09/2015 12/20/14   Thurnell Lose, MD  ondansetron (ZOFRAN-ODT) 4 MG disintegrating tablet Take 1 tablet (4 mg total) by mouth every 8 (eight) hours as needed for nausea or vomiting. 02/01/15   Janece Canterbury, MD  potassium chloride SA (K-DUR,KLOR-CON) 20 MEQ tablet Take 1 tablet (20 mEq total) by mouth daily. 02/01/15   Janece Canterbury, MD  venlafaxine (EFFEXOR) 37.5 MG tablet Take 1 tablet (37.5 mg total) by mouth 2 (two) times daily with a meal. Patient not taking: Reported on 03/15/2015 02/02/15   Janece Canterbury, MD   No Known Allergies CBC:    Component Value Date/Time   WBC 5.7 03-07-2015 0400   WBC 3.7* 04/16/2012 2022   HGB 7.3* Mar 07, 2015 0400   HGB 10.2* 04/16/2012 2022   HCT 22.4* 03-07-15 0400   HCT 33.4* 04/16/2012 2022   PLT 26* 2015-03-07 0400   MCV 103.7* 07-Mar-2015 0400   MCV 88.5 04/16/2012 2022   NEUTROABS 4.9 03/07/15 0400   LYMPHSABS 0.4* 2015/03/07 0400   MONOABS 0.2 March 07, 2015 0400   EOSABS 0.2 07-Mar-2015 0400   BASOSABS 0.0 03/07/15 0400   Comprehensive Metabolic Panel:    Component Value Date/Time   NA 136 03-07-2015 0400   K 4.2 03/07/2015 0400   CL 107 March 07, 2015 0400   CO2 12* 03/07/2015 0400   BUN 58* 03-07-15 0400   CREATININE 2.78* 2015-03-07 0400   CREATININE 0.62 04/16/2012 2015   GLUCOSE 52* March 07, 2015 0400   CALCIUM 7.9* 2015/03/07 0400   AST 372* 03-07-2015 0400   ALT 119* 03-07-2015 0400   ALKPHOS 38 03/07/15 0400   BILITOT 4.1* March 07, 2015 0400   PROT 4.7* 2015-03-07 0400   ALBUMIN 1.9* 2015/03/07 0400    Physical Exam:  Vital Signs:  BP 74/20 mmHg   Pulse 111  Temp(Src) 97.8 F (36.6 C) (Axillary)  Resp 33  Ht 5\' 11"  (1.803 m)  Wt 112.1 kg (247 lb 2.2 oz)  BMI 34.48 kg/m2  SpO2 90% SpO2: SpO2: 90 % O2 Device: O2 Device: Bi-PAP O2 Flow Rate: O2 Flow Rate (L/min): 3 L/min Intake/output summary:  Intake/Output Summary (Last 24 hours) at 03-08-2015 1524 Last data filed at March 08, 2015 1400  Gross per 24 hour  Intake 1688.33 ml  Output     22 ml  Net 1666.33 ml   LBM: Last BM Date: 03/02/15 Baseline Weight: Weight: 111.131 kg (245 lb) Most recent weight: Weight: 112.1 kg (247 lb 2.2 oz)  Exam Findings:  General: Lying in bed HEENT: BiPAP on CVS: Tachy Resp: Rales Abd: Distended - ascites Extrem: Anasarca, weeping Neuro: Minimally responsive           Palliative Performance Scale: 10 %                Additional Data Reviewed: Recent Labs     03/02/15  0410  03/08/15  0400  WBC  2.5*  5.7  HGB  7.2*  7.3*  PLT  43*  26*  NA  135  136  BUN  53*  58*  CREATININE  2.44*  2.78*     Time In: 1420 Time Out: 1540 Time Total: 16min  Greater than 50%  of this time was spent counseling and coordinating care related to the above assessment and plan.  Discussed plan of care with RN, Dr. Darrick Meigs.   Signed by:  Vinie Sill, NP Palliative Medicine Team Pager # [phone] (M-F 8a-5p) Team Phone # [phone] (Nights/Weekends)

## 2015-03-24 NOTE — Clinical Documentation Improvement (Signed)
Hospitalist  Can the abnormal lab values be further specified? Please update your documentation within the medical record to reflect your response to this query. Thank you  Possible conditions:  CKD Stage I - GFR greater than or equal to 90  CKD Stage II - GFR 60-89  CKD Stage III - GFR 30-59  CKD Stage IV - GFR 15-29  CKD Stage V - GFR < 15  ESRD (End Stage Renal Disease)  Other condition  Unable to clinically determine  Supporting Information: : (risk factors, signs and symptoms, diagnostics, treatment) Results for EPHRAIM, REICHEL (MRN [id number]) as of 12-Mar-2015 09:04  03/02/2015 19:04 03/02/2015 04:10 03/12/15 04:00  EGFR (Non-African Amer.) 33 (L) 27 (L) 23 (L)  03/02/15 progr note.Marland KitchenMarland Kitchen"Continue D5 normal saline at 50 mL per hour.... Trend daily BMP  Please exercise your independent, professional judgment when responding. A specific answer is not anticipated or expected.  Thank You, Ermelinda Das, RN, BSN, [place] Certified Clinical Documentation Specialist [place]: Health Information Management [phone]

## 2015-03-24 NOTE — Clinical Documentation Improvement (Signed)
  Hospitalist  Can the abnormal lab values be further specified? Please update your documentation within the medical record to reflect your response to this query. Thank you  Possible conditions:   Acute Renal Failure/Acute Kidney Injury  Acute Tubular Necrosis  Acute Renal Cortical Necrosis  Acute Renal Medullary Necrosis  Acute on Chronic Renal Failure  Chronic Renal Failure  Other  Clinically Undetermined  Document any associated diagnoses/conditions.  Supporting Information: Results for CECILIO, OHLRICH (MRN [id number]) as of 03-12-2015 09:04  03/02/2015 19:04 03/02/2015 04:10 03/12/2015 04:00  BUN 52 (H) 53 (H) 58 (H)  Creatinine 2.08 (H) 2.44 (H) 2.78 (H)  03/02/15 Progr note.Marland KitchenMarland Kitchen"Continue D5 normal saline at 50 mL per hour.".Marland KitchenMarland Kitchen Trend daily BMP  Please exercise your independent, professional judgment when responding. A specific answer is not anticipated or expected.  Thank You,  Ermelinda Das, RN, BSN, [place] Certified Clinical Documentation Specialist [place]: Health Information Management [phone]

## 2015-03-24 DEATH — deceased

## 2015-04-23 NOTE — Discharge Summary (Signed)
Death Summary  LY BACCHI ZOX:[id number] DOB: Aug 10, 1955 DOA: 03/12/2015  PCP: Barbette Merino, MD   Admit date: 2015/03/12 Date of Death: 06-Apr-2015  Final Diagnoses:  Principal Problem:   Sepsis ([place]) Active Problems:   HTN (hypertension)   Diabetes mellitus ([place])   Bipolar affect, depressed ([place])   Pancytopenia ([place])   Altered mental status   Anasarca   ALC (alcoholic liver cirrhosis) ([place])   Palliative care encounter    1.   History of present illness:  58 y.o. male with a past medical history of type 2 diabetes, bipolar disorder, hypertension, osteoarthritis, COPD, [name], GERD, stroke, past cocaine abuse, past alcohol abuse and end-stage liver disease due to alcoholic liver cirrhosis who was brought from [facility] facility due to confusion and fever since earlier today. Patient had initial temperature of 103.21F in the emergency department, was mildly tachypneic in the mid to high 20s and tachycardic in the 110s. Patient is unable to provide significant history, but he was admitted last month due to a similar picture.   Hospital Course:  1. Sepsis- patient came with hypotension, gram variable rods and blood cultures, started on  vancomycin and Zosyn. ? Source, ? SBP. Paracentesis was performed yesterday, yielded 200 mL of  fluid. Total cell count 90, LDH 34, cultures pending.[name] follow the results of ascitic fluid analysis. Lactate significantly elevated 9.24 last night. Palliative care was consulted and after discussion with patient's brother and sister-in-law patient was made full comfort care. He was started on low-dose Dilaudid. 2. Acute respiratory failure- secondary to pulmonary edema, patient required BiPAP. 3. Liver cirrhosis - patient has end-stage liver disease, lactulose, rifaximin, Lasix on hold due to sepsis  Patient expired at 1720 on 2015/03/14   Signed:  [facility]  [facility] 04-06-15, 3:31 PM

## 2015-11-09 IMAGING — US US PARACENTESIS
1 series · 7 of 7 positions shown · non-contrast
Comparison: 10/15/14 paracentesis.

MEDICATIONS:
None.

COMPLICATIONS:
None immediate

INDICATION: Recurrent ascites and request for therapeutic paracentesis.

EXAM:
ULTRASOUND-GUIDED PARACENTESIS
TECHNIQUE: Informed written consent was obtained from the patient after a
discussion of the risks, benefits and alternatives to treatment. A
timeout was performed prior to the initiation of the procedure.

[Series 1: us paracentesis · 0.27mm/px · 7 of 7 slices shown]
[im 1/7]
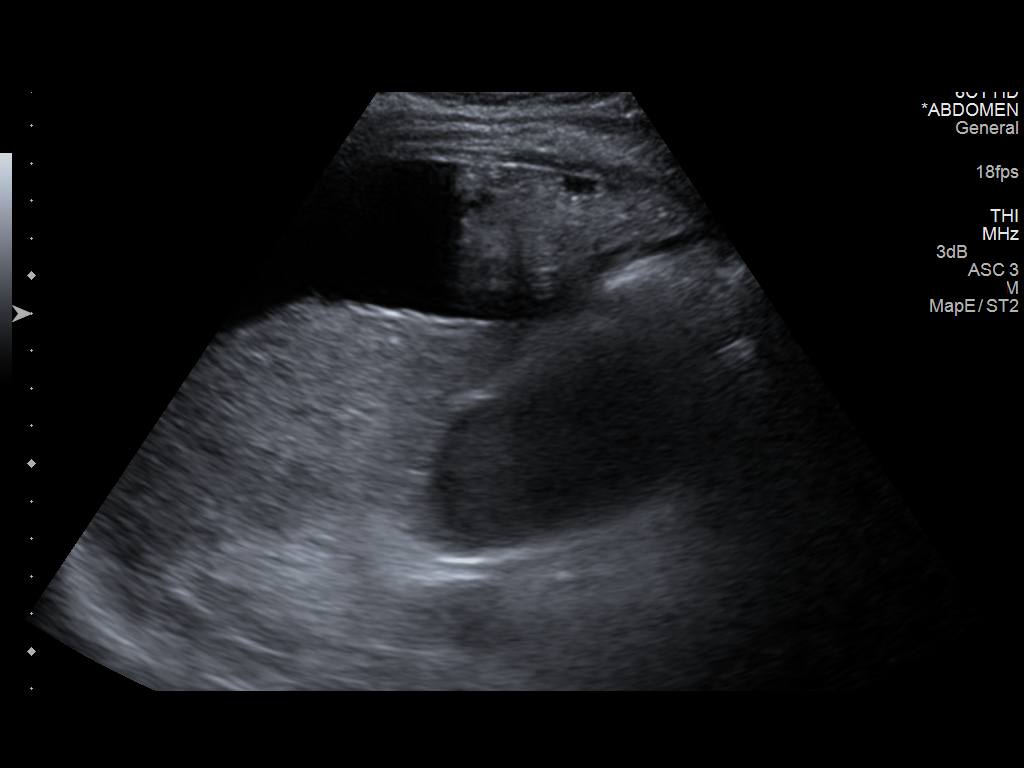
[im 2/7]
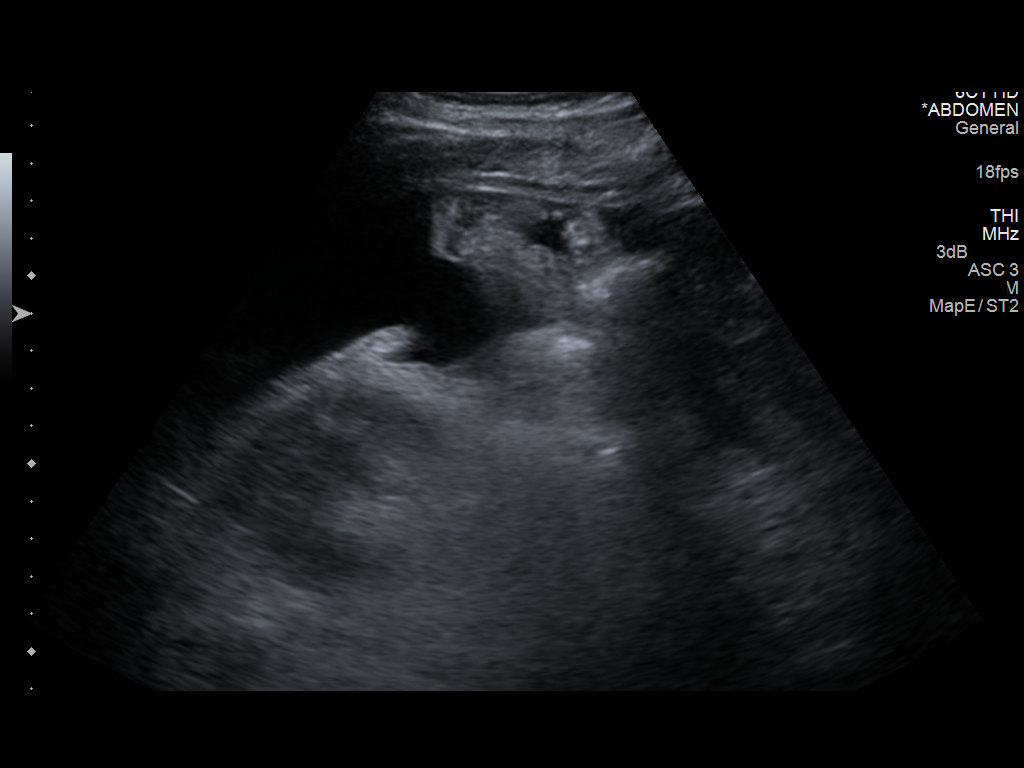
[im 3/7]
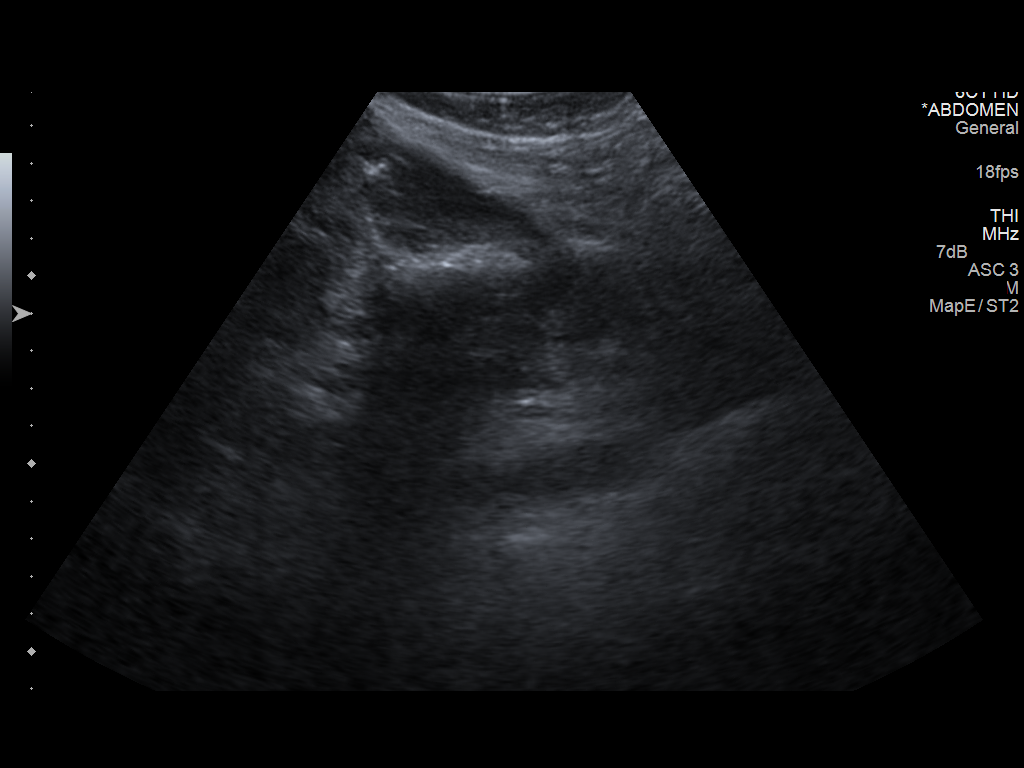
[im 4/7]
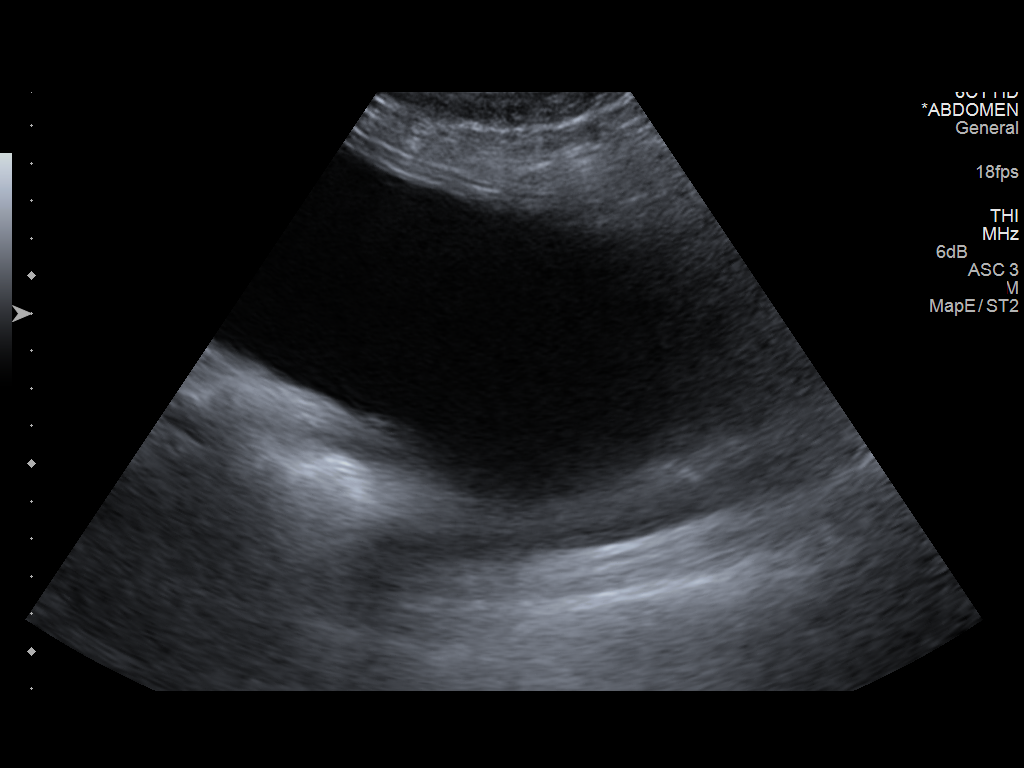
[im 5/7]
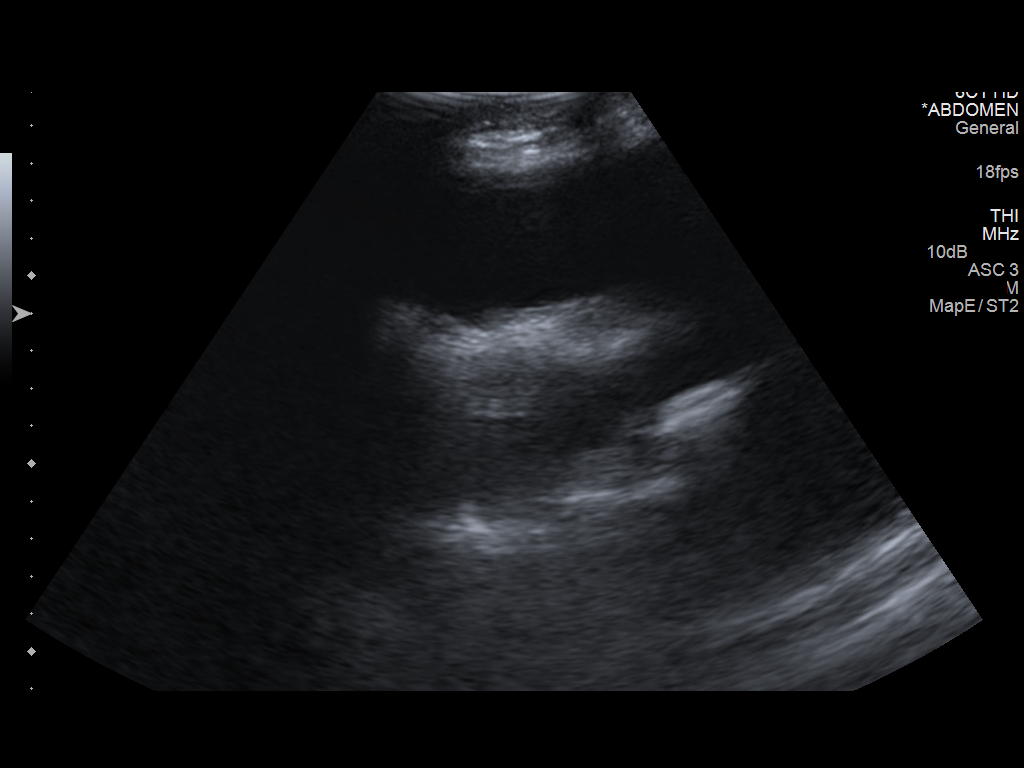
[im 6/7]
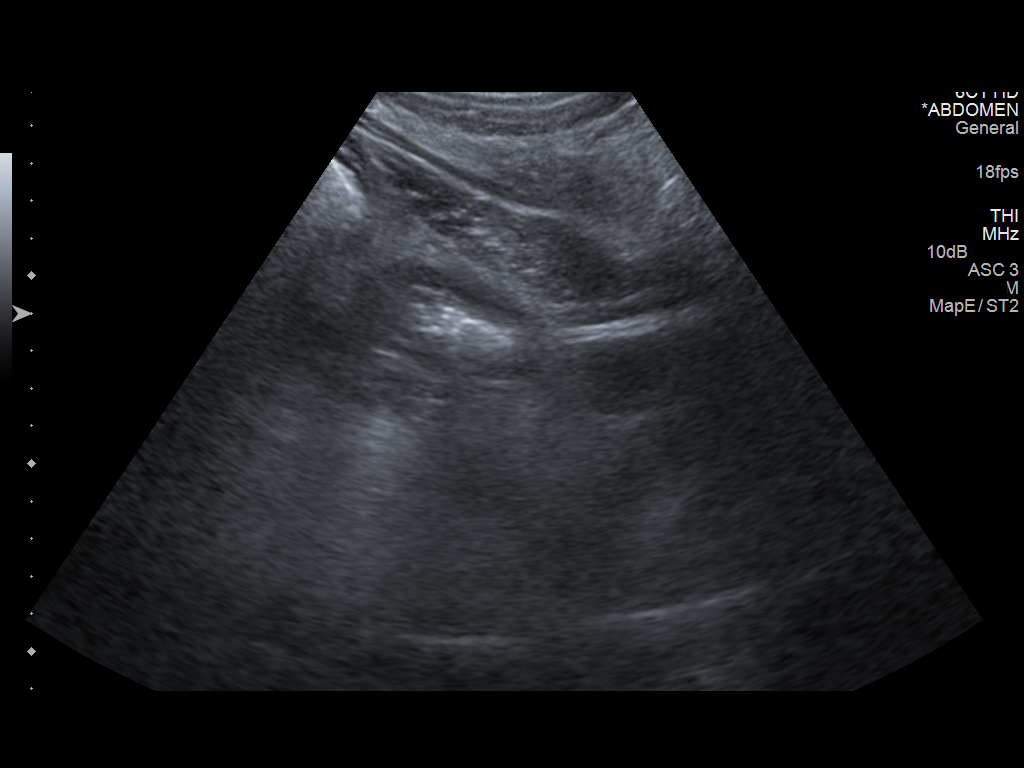
[im 7/7]
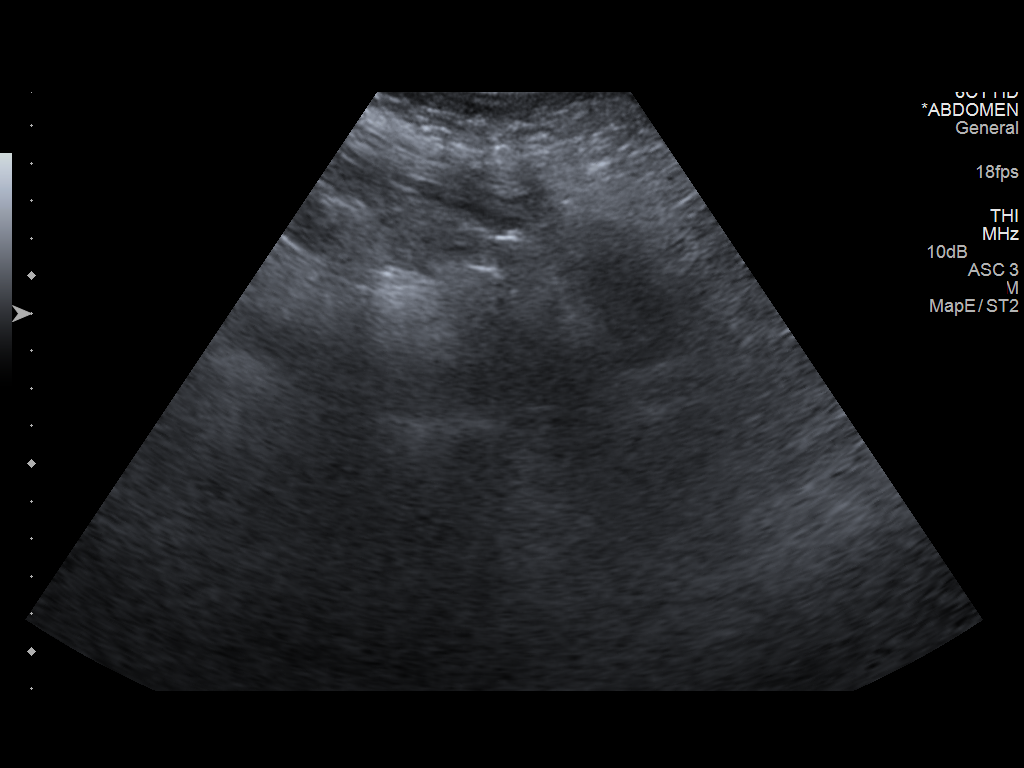

[7 of 7 positions shown; findings below may reference images not displayed]

Initial ultrasound scanning demonstrates a moderate to large amount
of ascites within the left lower abdominal quadrant. The left lower
abdomen was prepped and draped in the usual sterile fashion. 1%
lidocaine was used for local anesthesia.

An ultrasound image was saved for documentation purposed. A 6 Fr
Safe-T-Centesis catheter was introduced. The paracentesis was
performed. The catheter was removed and a dressing was applied. The
patient tolerated the procedure well without immediate post
procedural complication.
FINDINGS: A total of approximately 3.8 liters of serous fluid was removed.
IMPRESSION: Successful ultrasound-guided paracentesis yielding 3.8 liters of
peritoneal fluid.

## 2016-11-23 IMAGING — CT CT HEAD W/O CM
2 series · 15 of 30 positions shown, 19 images · non-contrast
Comparison: Head CT [DATE].

CLINICAL DATA: 59-year-old female found unconscious on floor.
Altered mental status.

EXAM:
CT HEAD WITHOUT CONTRAST
TECHNIQUE: Contiguous axial images were obtained from the base of the skull
through the vertex without intravenous contrast.

[Series 2: head w/o · axial · non-contrast · 0.48mm/px · z∈[+1341,+1466]mm · 13 of 31 slices shown, 17 images]
[im 3/31  brain]
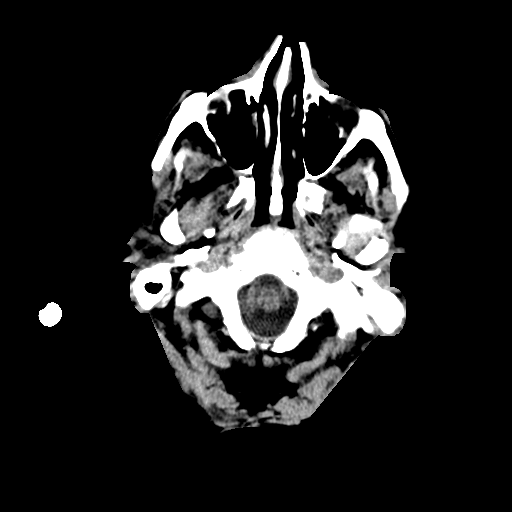
[im 3/31  bone]
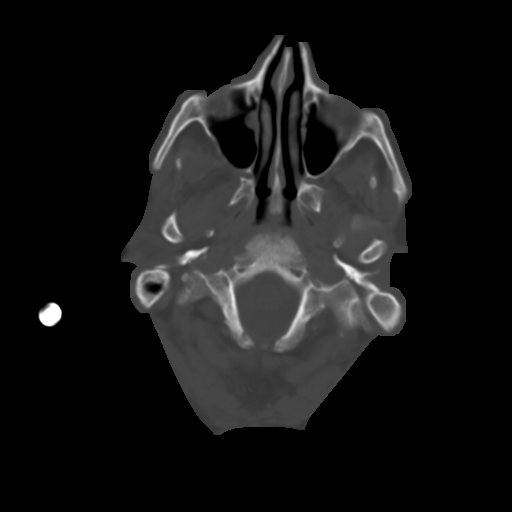
[im 5/31  brain]
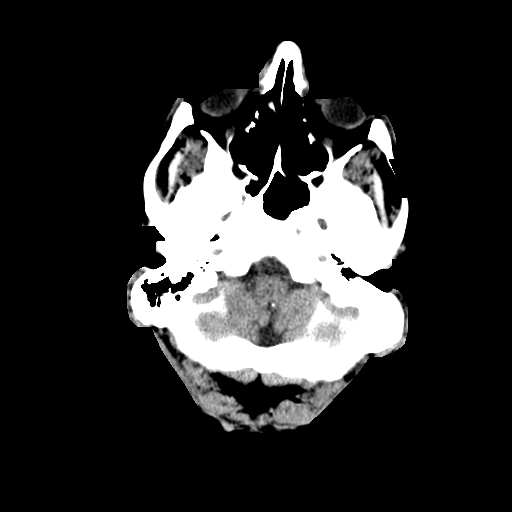
[im 7/31  brain]
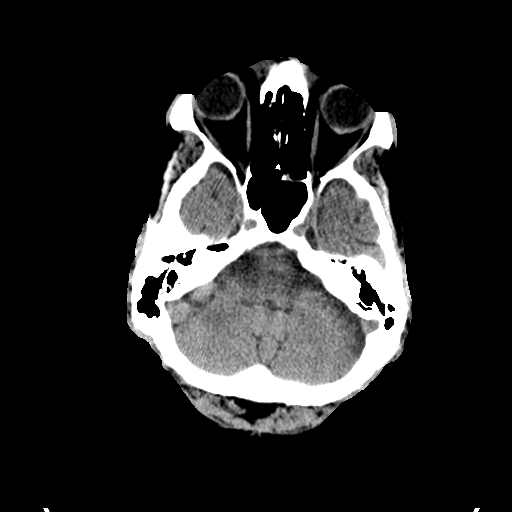
[im 9/31  brain]
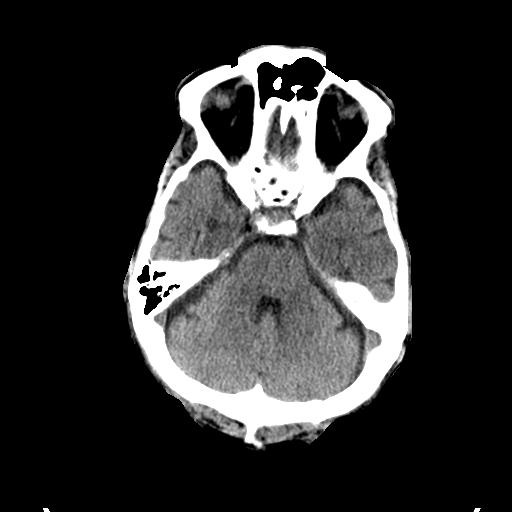
[im 11/31  brain]
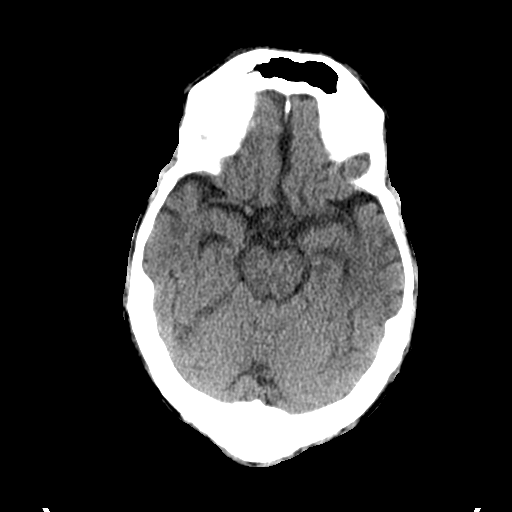
[im 11/31  bone]
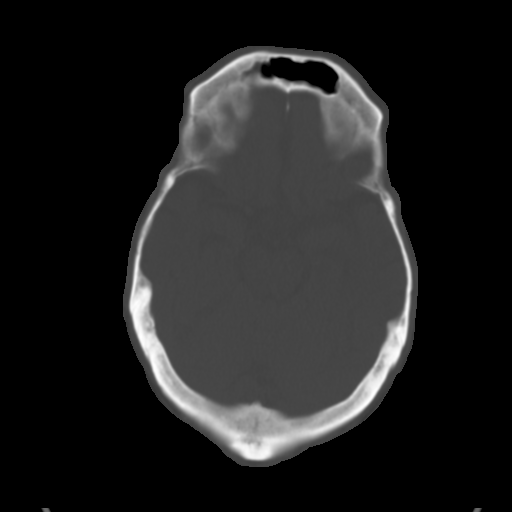
[im 13/31  brain]
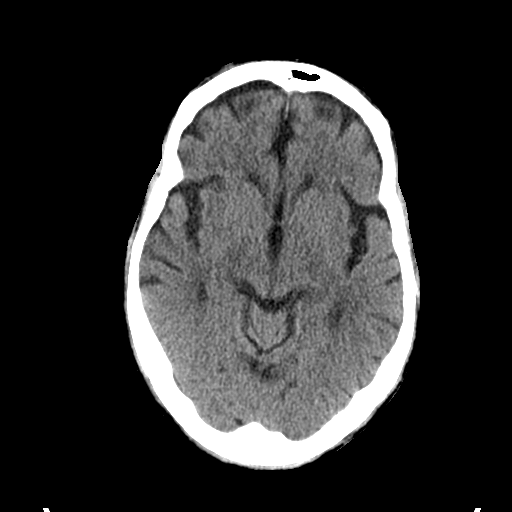
[im 16/31  brain]
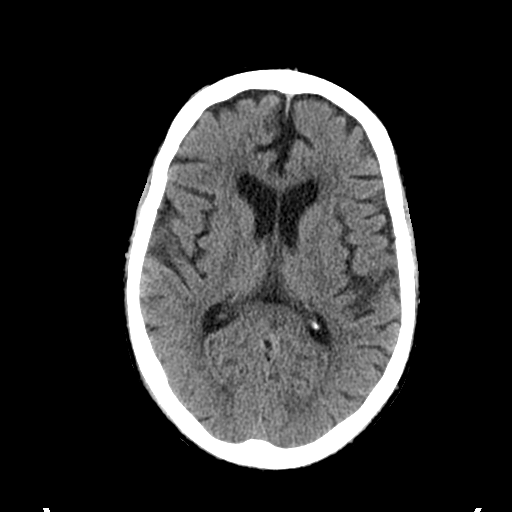
[im 18/31  brain]
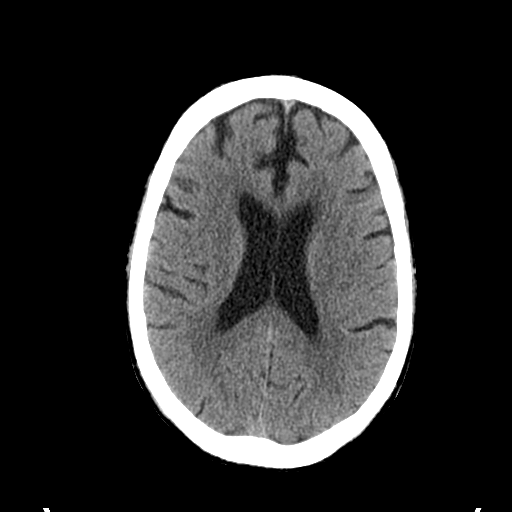
[im 20/31  brain]
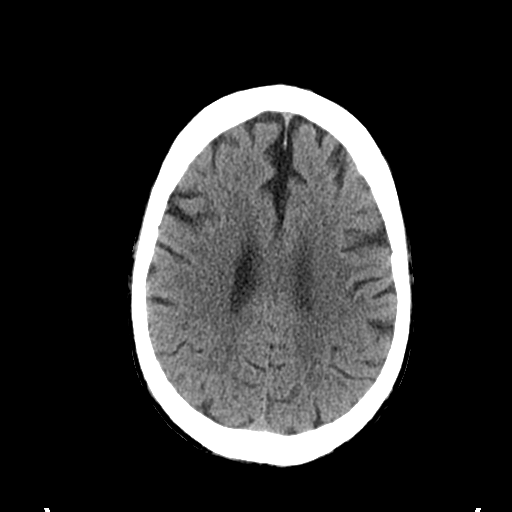
[im 20/31  bone]
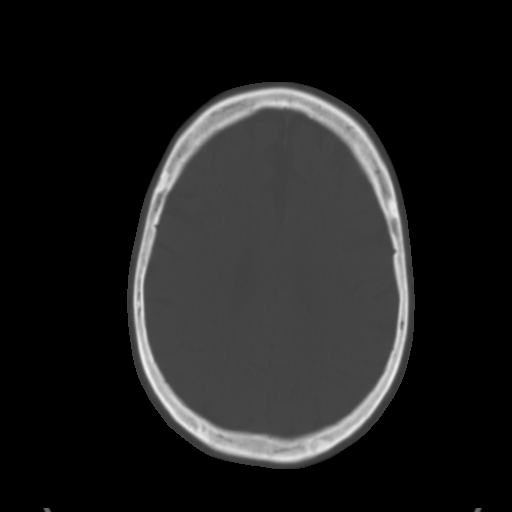
[im 22/31  brain]
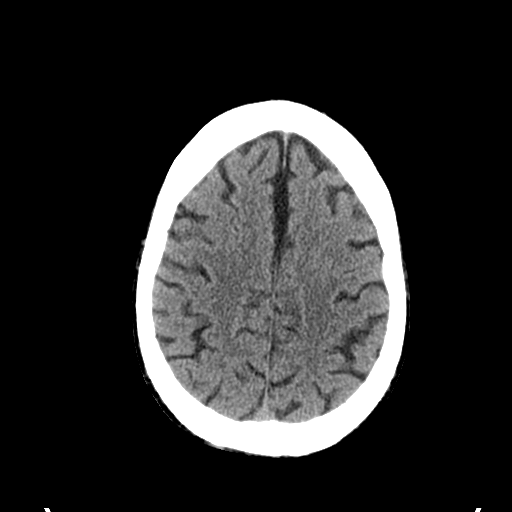
[im 24/31  brain]
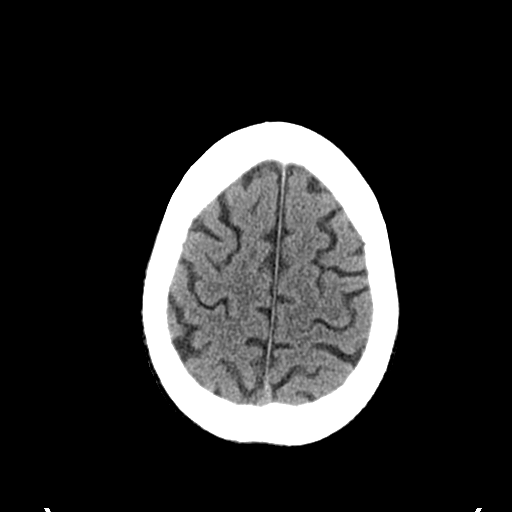
[im 26/31  brain]
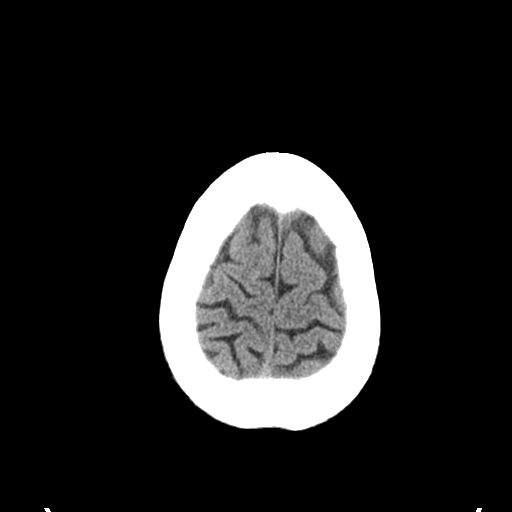
[im 28/31  brain]
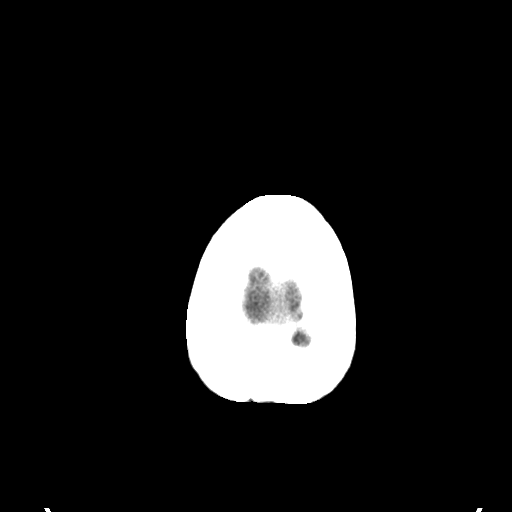
[im 28/31  bone]
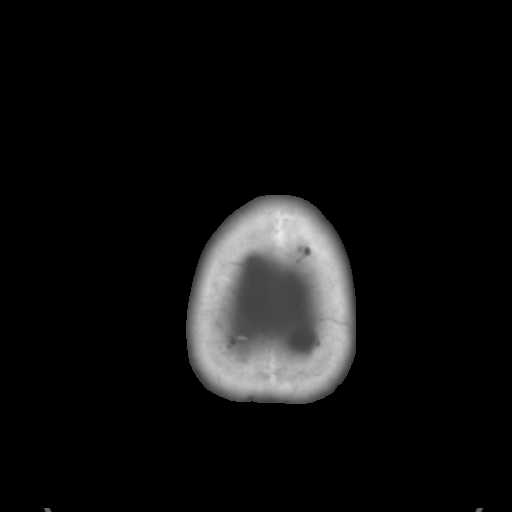

[Series 3: bone windows · axial · 0.48mm/px · z∈[+1341,+1361]mm · 2 of 31 slices shown]
[im 3/31  bone]
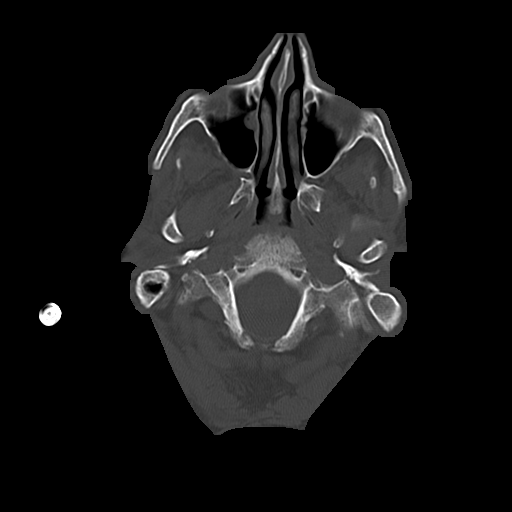
[im 7/31  bone]
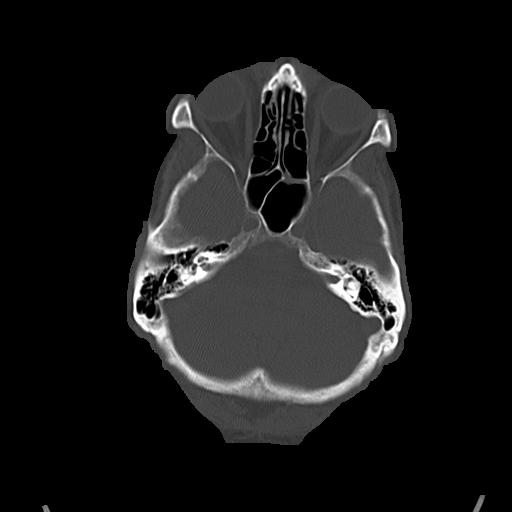

[15 of 30 positions shown; findings below may reference images not displayed]

FINDINGS: Mild cerebral atrophy. Patchy and confluent areas of decreased
attenuation are noted throughout the deep and periventricular white
matter of the cerebral hemispheres bilaterally, compatible with
chronic microvascular ischemic disease. No acute intracranial
abnormalities. Specifically, no evidence of acute intracranial
hemorrhage, no definite findings of acute/subacute cerebral
ischemia, no mass, mass effect, hydrocephalus or abnormal intra or
extra-axial fluid collections. Visualized paranasal sinuses and
mastoids are well pneumatized. No acute displaced skull fractures
are identified.
IMPRESSION: 1. No acute intracranial abnormalities.
2. Mild cerebral atrophy with mild chronic microvascular ischemic
changes in the cerebral white matter.

## 2016-11-23 IMAGING — DX DG CHEST 1V PORT
1 series · 1 of 1 positions shown · non-contrast
Comparison: Portable exam 9978 hours compared to 01/14/2015

CLINICAL DATA: BILATERAL lower extremity swelling for 2-3 days,
LEFT rib pain, shortness of breath, hypertension, diabetes mellitus,
COPD, smoker

EXAM:
PORTABLE CHEST - 1 VIEW

[chest ap]
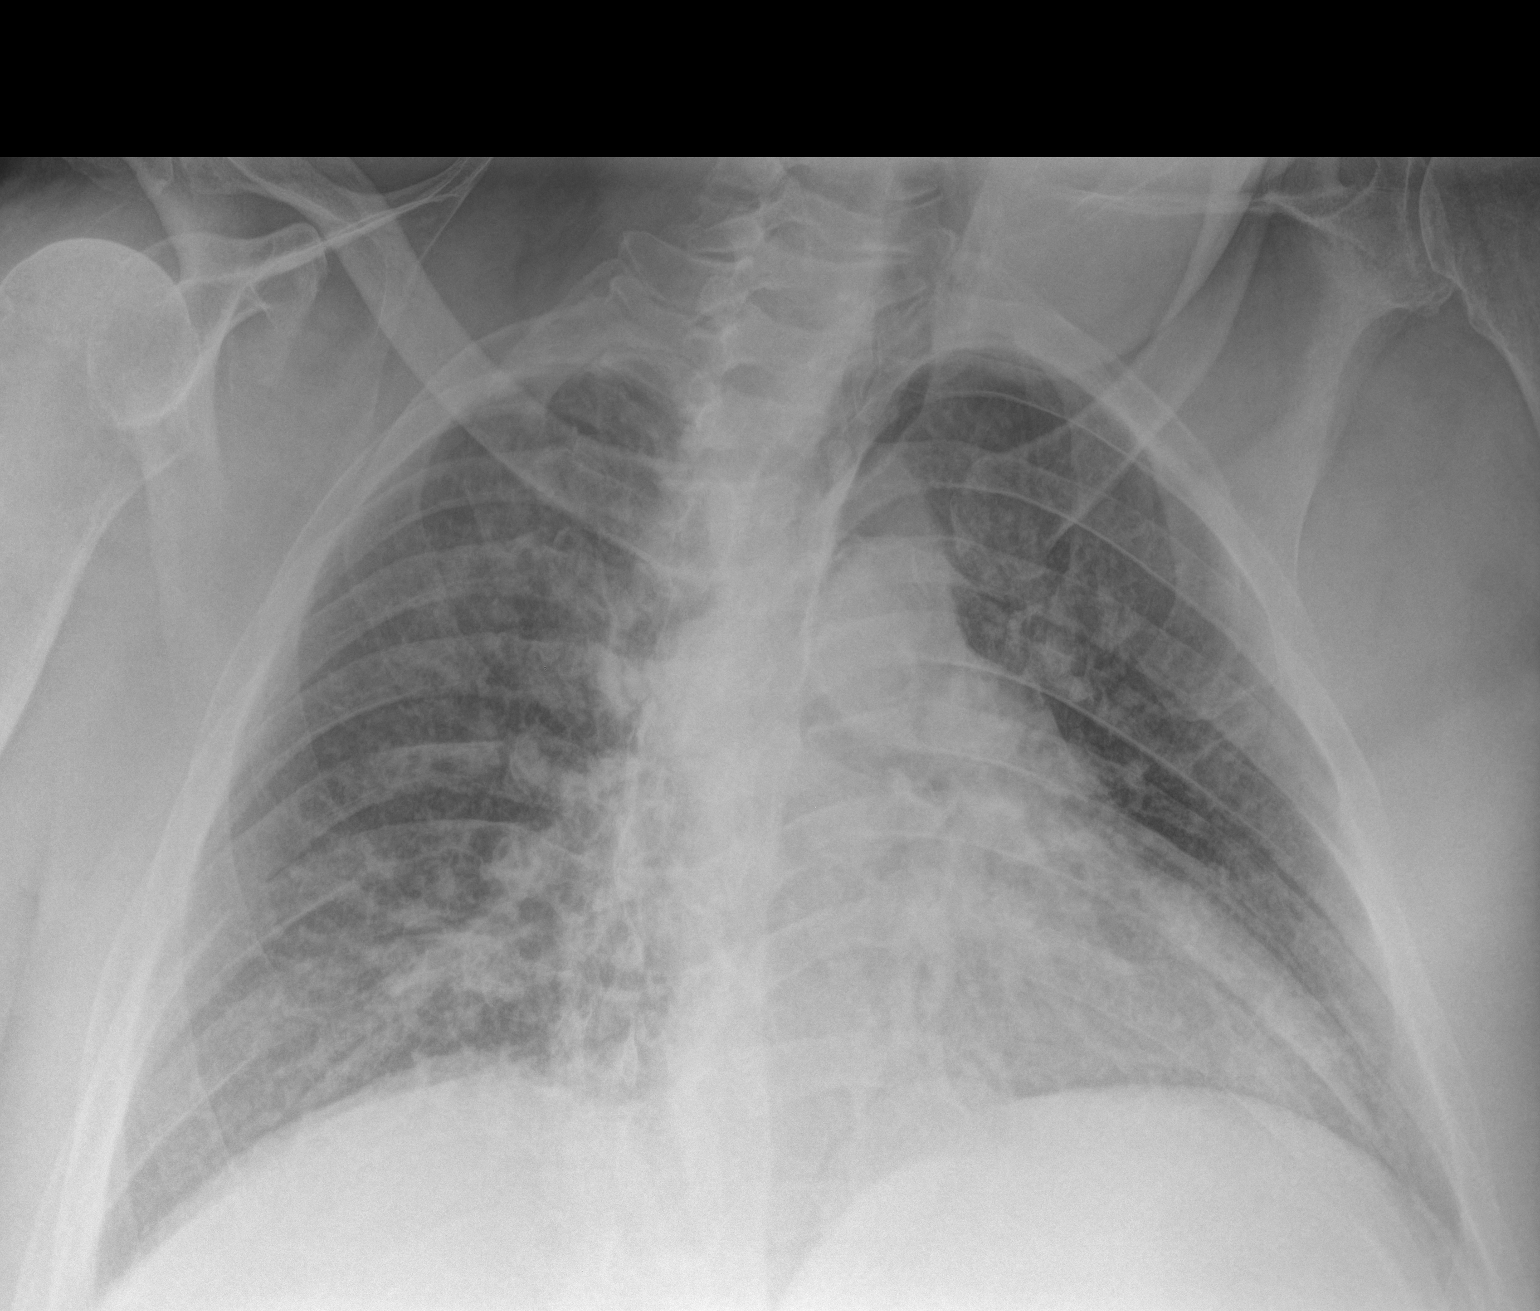

[1 of 1 positions shown; findings below may reference images not displayed]

FINDINGS: Upper normal size of cardiac silhouette.

Rotated to the LEFT.

Mediastinal contours normal.

Coarsened interstitial markings in the mid to lower lungs question
infiltrate.

No pleural effusion or pneumothorax.

Bones unremarkable.
IMPRESSION: Coarsened interstitial markings in the mid to lower lungs new since
previous exam question interstitial infiltrates which could
represent edema or infection.
